# Patient Record
Sex: Male | Born: 1955 | Race: White | Hispanic: No | Marital: Married | Smoking: Current every day smoker
Health system: Southern US, Community
[De-identification: ages and names within clinical notes are randomized; demographics above are authoritative.]

## PROBLEM LIST (undated history)

## (undated) DIAGNOSIS — Z9889 Other specified postprocedural states: Secondary | ICD-10-CM

## (undated) DIAGNOSIS — I10 Essential (primary) hypertension: Secondary | ICD-10-CM

## (undated) DIAGNOSIS — Z87442 Personal history of urinary calculi: Secondary | ICD-10-CM

## (undated) DIAGNOSIS — C719 Malignant neoplasm of brain, unspecified: Secondary | ICD-10-CM

## (undated) DIAGNOSIS — Z8669 Personal history of other diseases of the nervous system and sense organs: Secondary | ICD-10-CM

## (undated) DIAGNOSIS — Z803 Family history of malignant neoplasm of breast: Secondary | ICD-10-CM

## (undated) DIAGNOSIS — M4302 Spondylolysis, cervical region: Secondary | ICD-10-CM

## (undated) DIAGNOSIS — C3490 Malignant neoplasm of unspecified part of unspecified bronchus or lung: Secondary | ICD-10-CM

## (undated) DIAGNOSIS — E119 Type 2 diabetes mellitus without complications: Secondary | ICD-10-CM

## (undated) DIAGNOSIS — K219 Gastro-esophageal reflux disease without esophagitis: Secondary | ICD-10-CM

## (undated) DIAGNOSIS — F32A Depression, unspecified: Secondary | ICD-10-CM

## (undated) DIAGNOSIS — C801 Malignant (primary) neoplasm, unspecified: Secondary | ICD-10-CM

## (undated) DIAGNOSIS — R112 Nausea with vomiting, unspecified: Secondary | ICD-10-CM

## (undated) DIAGNOSIS — E785 Hyperlipidemia, unspecified: Secondary | ICD-10-CM

## (undated) HISTORY — DX: Type 2 diabetes mellitus without complications: E11.9

## (undated) HISTORY — DX: Depression, unspecified: F32.A

## (undated) HISTORY — DX: Malignant neoplasm of unspecified part of unspecified bronchus or lung: C34.90

## (undated) HISTORY — DX: Personal history of other diseases of the nervous system and sense organs: Z86.69

## (undated) HISTORY — DX: Essential (primary) hypertension: I10

## (undated) HISTORY — PX: OTHER SURGICAL HISTORY: SHX169

## (undated) HISTORY — PX: HERNIA REPAIR: SHX51

## (undated) HISTORY — DX: Malignant neoplasm of brain, unspecified: C71.9

## (undated) HISTORY — DX: Hyperlipidemia, unspecified: E78.5

## (undated) HISTORY — DX: Spondylolysis, cervical region: M43.02

## (undated) HISTORY — DX: Family history of malignant neoplasm of breast: Z80.3

---

## 2011-11-01 ENCOUNTER — Telehealth: Payer: Self-pay

## 2011-11-01 NOTE — Telephone Encounter (Signed)
Called pt, he is shopping and will call back later.

## 2011-11-01 NOTE — Telephone Encounter (Signed)
Pt's wife called and said that she will call in a few weeks to schedule for her husband. She has just had a total knee replacement and cannot drive for awhile. She also said he has hemorroids and I told her he will need an OV also. She said she goes back to the doctor on 11/29/2011 and she will call sometime after that . I will also have him on my reminder list.

## 2011-11-02 ENCOUNTER — Encounter (INDEPENDENT_AMBULATORY_CARE_PROVIDER_SITE_OTHER): Payer: Self-pay | Admitting: *Deleted

## 2011-12-07 ENCOUNTER — Telehealth: Payer: Self-pay

## 2011-12-10 ENCOUNTER — Other Ambulatory Visit: Payer: Self-pay

## 2011-12-10 DIAGNOSIS — Z139 Encounter for screening, unspecified: Secondary | ICD-10-CM

## 2011-12-10 NOTE — Telephone Encounter (Addendum)
Gastroenterology Pre-Procedure Form   Pt's hemorrhoids not bothering him now. Does not want OV at this time.  Infor from pt's wife ( she was asking him questions as we talked)  Request Date: 12/07/2011     Requesting Physician: Dr. Rudi Heap     PATIENT INFORMATION:  Bradley Goodman is a 56 y.o., male (DOB=22-Nov-1955).  PROCEDURE: Procedure(s) requested: colonoscopy Procedure Reason: screening for colon cancer  PATIENT REVIEW QUESTIONS: The patient reports the following:   1. Diabetes Melitis: no 2. Joint replacements in the past 12 months: no 3. Major health problems in the past 3 months: no 4. Has an artificial valve or MVP:no 5. Has been advised in past to take antibiotics in advance of a procedure like teeth cleaning: no}    MEDICATIONS & ALLERGIES:    Patient reports the following regarding taking any blood thinners:   Plavix? no Aspirin?yes  Coumadin?  no  Patient confirms/reports the following medications:  Current Outpatient Prescriptions  Medication Sig Dispense Refill  . aspirin 81 MG tablet Take 81 mg by mouth daily.      . cholecalciferol (VITAMIN D) 1000 UNITS tablet Take 1,000 Units by mouth daily. Pt takes two tablets daily      . esomeprazole (NEXIUM) 40 MG capsule Take 40 mg by mouth daily before breakfast.      . Multiple Vitamin (MULTIVITAMIN) tablet Take 1 tablet by mouth daily.      . NON FORMULARY Mega Red Krill Oil   300 mg qd      . NON FORMULARY Vitamin B12   2500 mcg qd      . rosuvastatin (CRESTOR) 20 MG tablet Take 20 mg by mouth daily.        Patient confirms/reports the following allergies:  Allergies no known allergies  Patient is appropriate to schedule for requested procedure(s): yes  AUTHORIZATION INFORMATION Primary Insurance:   ID #:   Group #:  Pre-Cert / Auth required:  Pre-Cert / Auth #:   Secondary Insurance:   ID #:  Group #:  Pre-Cert / Auth required:  Pre-Cert / Auth #:   No orders of the defined types were placed in this  encounter.    SCHEDULE INFORMATION: Procedure has been scheduled as follows:  Date: 01/08/2012        Time: 10:30 AM  Location: [facility]  This Gastroenterology Pre-Precedure Form is being routed to the following provider(s) for review: R. Roetta Sessions, MD

## 2011-12-10 NOTE — Telephone Encounter (Signed)
Rx and instructons mailed to pt.

## 2011-12-10 NOTE — Telephone Encounter (Signed)
OK to schedule

## 2011-12-26 ENCOUNTER — Telehealth: Payer: Self-pay

## 2011-12-26 NOTE — Telephone Encounter (Signed)
Pt was scheduled for his colonoscopy on 01/08/2012 ( prior to Dr. Luvenia Starch vacation schedule). I called pt and rescheduled to 01/23/2012 @ 9:30 AM. Called back and informed Kim.

## 2012-01-22 ENCOUNTER — Encounter (HOSPITAL_COMMUNITY): Payer: Self-pay | Admitting: Pharmacy Technician

## 2012-01-22 MED ORDER — SODIUM CHLORIDE 0.45 % IV SOLN
Freq: Once | INTRAVENOUS | Status: AC
  Administered 2012-01-23: 09:00:00 via INTRAVENOUS

## 2012-01-23 ENCOUNTER — Ambulatory Visit (HOSPITAL_COMMUNITY)
Admission: RE | Admit: 2012-01-23 | Discharge: 2012-01-23 | Disposition: A | Discharge status: Home Health | Payer: 59 | Source: Ambulatory Visit | Attending: Internal Medicine | Admitting: Internal Medicine

## 2012-01-23 ENCOUNTER — Encounter (HOSPITAL_COMMUNITY): Payer: Self-pay | Admitting: *Deleted

## 2012-01-23 ENCOUNTER — Encounter (HOSPITAL_COMMUNITY)
Admission: RE | Disposition: A | Discharge status: Home Health | Payer: Self-pay | Source: Ambulatory Visit | Attending: Internal Medicine

## 2012-01-23 DIAGNOSIS — Z1211 Encounter for screening for malignant neoplasm of colon: Secondary | ICD-10-CM

## 2012-01-23 DIAGNOSIS — D128 Benign neoplasm of rectum: Secondary | ICD-10-CM | POA: Insufficient documentation

## 2012-01-23 DIAGNOSIS — E7800 Pure hypercholesterolemia, unspecified: Secondary | ICD-10-CM | POA: Insufficient documentation

## 2012-01-23 DIAGNOSIS — Z139 Encounter for screening, unspecified: Secondary | ICD-10-CM

## 2012-01-23 DIAGNOSIS — K621 Rectal polyp: Secondary | ICD-10-CM

## 2012-01-23 DIAGNOSIS — K620 Anal polyp: Secondary | ICD-10-CM

## 2012-01-23 DIAGNOSIS — D126 Benign neoplasm of colon, unspecified: Secondary | ICD-10-CM

## 2012-01-23 HISTORY — DX: Gastro-esophageal reflux disease without esophagitis: K21.9

## 2012-01-23 HISTORY — PX: COLONOSCOPY: SHX5424

## 2012-01-23 SURGERY — COLONOSCOPY
Anesthesia: Moderate Sedation

## 2012-01-23 MED ORDER — MIDAZOLAM HCL 5 MG/5ML IJ SOLN
INTRAMUSCULAR | Status: AC
  Filled 2012-01-23: qty 10

## 2012-01-23 MED ORDER — STERILE WATER FOR IRRIGATION IR SOLN
Status: DC | PRN
  Administered 2012-01-23: 10:00:00

## 2012-01-23 MED ORDER — MEPERIDINE HCL 100 MG/ML IJ SOLN
INTRAMUSCULAR | Status: AC
  Filled 2012-01-23: qty 2

## 2012-01-23 MED ORDER — MEPERIDINE HCL 100 MG/ML IJ SOLN
INTRAMUSCULAR | Status: DC | PRN
Start: 2012-01-23 — End: 2012-01-23
  Administered 2012-01-23: 25 mg via INTRAVENOUS
  Administered 2012-01-23: 50 mg via INTRAVENOUS
  Administered 2012-01-23: 25 mg via INTRAVENOUS

## 2012-01-23 MED ORDER — MIDAZOLAM HCL 5 MG/5ML IJ SOLN
INTRAMUSCULAR | Status: DC | PRN
  Administered 2012-01-23 (×2): 1 mg via INTRAVENOUS
  Administered 2012-01-23: 2 mg via INTRAVENOUS

## 2012-01-23 NOTE — Discharge Instructions (Addendum)
Colonoscopy Discharge Instructions  Read the instructions outlined below and refer to this sheet in the next few weeks. These discharge instructions provide you with general information on caring for yourself after you leave the hospital. Your doctor may also give you specific instructions. While your treatment has been planned according to the most current medical practices available, unavoidable complications occasionally occur. If you have any problems or questions after discharge, call Dr. Jena Gauss at [phone]. ACTIVITY  You may resume your regular activity, but move at a slower pace for the next 24 hours.   Take frequent rest periods for the next 24 hours.   Walking will help get rid of the air and reduce the bloated feeling in your belly (abdomen).   No driving for 24 hours (because of the medicine (anesthesia) used during the test).    Do not sign any important legal documents or operate any machinery for 24 hours (because of the anesthesia used during the test).  NUTRITION  Drink plenty of fluids.   You may resume your normal diet as instructed by your doctor.   Begin with a light meal and progress to your normal diet. Heavy or fried foods are harder to digest and may make you feel sick to your stomach (nauseated).   Avoid alcoholic beverages for 24 hours or as instructed.  MEDICATIONS  You may resume your normal medications unless your doctor tells you otherwise.  WHAT YOU CAN EXPECT TODAY  Some feelings of bloating in the abdomen.   Passage of more gas than usual.   Spotting of blood in your stool or on the toilet paper.  IF YOU HAD POLYPS REMOVED DURING THE COLONOSCOPY:  No aspirin products for 7 days or as instructed.   No alcohol for 7 days or as instructed.   Eat a soft diet for the next 24 hours.  FINDING OUT THE RESULTS OF YOUR TEST Not all test results are available during your visit. If your test results are not back during the visit, make an appointment  with your caregiver to find out the results. Do not assume everything is normal if you have not heard from your caregiver or the medical facility. It is important for you to follow up on all of your test results.  SEEK IMMEDIATE MEDICAL ATTENTION IF:  You have more than a spotting of blood in your stool.   Your belly is swollen (abdominal distention).   You are nauseated or vomiting.   You have a temperature over 101.   You have abdominal pain or discomfort that is severe or gets worse throughout the day.    Polyp Information provided to the patient.  Note MRI in the future until clip known to have passed.  Colon Polyps A polyp is extra tissue that grows inside your body. Colon polyps grow in the large intestine. The large intestine, also called the colon, is part of your digestive system. It is a long, hollow tube at the end of your digestive tract where your body makes and stores stool. Most polyps are not dangerous. They are benign. This means they are not cancerous. But over time, some types of polyps can turn into cancer. Polyps that are smaller than a pea are usually not harmful. But larger polyps could someday become or may already be cancerous. To be safe, doctors remove all polyps and test them.  WHO GETS POLYPS? Anyone can get polyps, but certain people are more likely than others. You may have a greater chance  of getting polyps if:  You are over 50.   You have had polyps before.   Someone in your family has had polyps.   Someone in your family has had cancer of the large intestine.   Find out if someone in your family has had polyps. You may also be more likely to get polyps if you:   Eat a lot of fatty foods.   Smoke.   Drink alcohol.   Do not exercise.   Eat too much.  SYMPTOMS  Most small polyps do not cause symptoms. People often do not know they have one until their caregiver finds it during a regular checkup or while testing them for something else. Some  people do have symptoms like these:  Bleeding from the anus. You might notice blood on your underwear or on toilet paper after you have had a bowel movement.   Constipation or diarrhea that lasts more than a week.   Blood in the stool. Blood can make stool look black or it can show up as red streaks in the stool.  If you have any of these symptoms, see your caregiver. HOW DOES THE DOCTOR TEST FOR POLYPS? The doctor can use four tests to check for polyps:  Digital rectal exam. The caregiver wears gloves and checks your rectum (the last part of the large intestine) to see if it feels normal. This test would find polyps only in the rectum. Your caregiver may need to do one of the other tests listed below to find polyps higher up in the intestine.   Barium enema. The caregiver puts a liquid called barium into your rectum before taking x-rays of your large intestine. Barium makes your intestine look white in the pictures. Polyps are dark, so they are easy to see.   Sigmoidoscopy. With this test, the caregiver can see inside your large intestine. A thin flexible tube is placed into your rectum. The device is called a sigmoidoscope, which has a light and a tiny video camera in it. The caregiver uses the sigmoidoscope to look at the last third of your large intestine.   Colonoscopy. This test is like sigmoidoscopy, but the caregiver looks at all of the large intestine. It usually requires sedation. This is the most common method for finding and removing polyps.  TREATMENT   The caregiver will remove the polyp during sigmoidoscopy or colonoscopy. The polyp is then tested for cancer.   If you have had polyps, your caregiver may want you to get tested regularly in the future.  PREVENTION  There is not one sure way to prevent polyps. You might be able to lower your risk of getting them if you:  Eat more fruits and vegetables and less fatty food.   Do not smoke.   Avoid alcohol.   Exercise every  day.   Lose weight if you are overweight.   Eating more calcium and folate can also lower your risk of getting polyps. Some foods that are rich in calcium are milk, cheese, and broccoli. Some foods that are rich in folate are chickpeas, kidney beans, and spinach.   Aspirin might help prevent polyps. Studies are under way.  Document Released: 06/20/2004 Document Revised: 09/13/2011 Document Reviewed: 11/26/2007 [facility] Patient Information 2012 [place], [place].

## 2012-01-23 NOTE — Op Note (Signed)
[facility] [address] [place], [place]  [address]  COLONOSCOPY PROCEDURE REPORT  PATIENT:  Bradley Goodman, Bradley Goodman  MR#:  [id number] BIRTHDATE:  [birth year], 55 yrs. old  GENDER:  male ENDOSCOPIST:  R. Roetta Sessions, MD FACP [facility] REF. BY:           Rudi Heap, M.D. PROCEDURE DATE:  01/23/2012 PROCEDURE:  Colonoscopy with multiple snare polypectomies colon tattooing and hemostasis clipping  INDICATIONS:  First ever average risk screening colonoscopy  INFORMED CONSENT:  The risks, benefits, alternatives and imponderables including but not limited to bleeding, perforation as well as the possibility of a missed lesion have been reviewed. The potential for biopsy, lesion removal, etc. have also been discussed.  Questions have been answered.  All parties agreeable. Please see the history and physical in the medical record for more information.  MEDICATIONS:  Versed 4 mg IV and Demerol 100 mg IV in divided this  DESCRIPTION OF PROCEDURE:  After a digital rectal exam was performed, the EC-[id number] ([id number]) colonoscope was advanced from the anus through the rectum and colon to the area of the cecum, ileocecal valve and appendiceal orifice.  The cecum was deeply intubated.  These structures were well-seen and photographed for the record.  From the level of the cecum and ileocecal valve, the scope was slowly and cautiously withdrawn.  The mucosal surfaces were carefully surveyed utilizing scope tip deflection to facilitate fold flattening as needed.  The scope was pulled down into the rectum where a thorough examination including retroflexion was performed. <<PROCEDUREIMAGES>>  FINDINGS:  Adequate preparation. 5 mm polyp in 5 cm from the anal verge. Minimal internal hemorrhoids and anal papilla. Patient had multiple left-sided colon polyps (The patient had an 8 mm and 6 mm pedunculated polyps in the descending segment. The patient had a large - nearly 2 cm polyp in the sigmoid at  35 cm on a long stalk; the remainder of the colonic mucosa appeared normal. The terminal ileal mucosa - distal 5 cm appeared normal.  THERAPEUTIC / DIAGNOSTIC MANEUVERS PERFORMED: The colonic and rectal polyps removed with hot snare cautery. The polypectomy site where the largest polyp was removed wanted to ooze. This was treated with resolution clipping x1. The stalk was also tattooed.  COMPLICATIONS:  None  CECAL WITHDRAWAL TIME: 37 minutes  IMPRESSION: Multiple rectal and colonic polyps-removed as described above  RECOMMENDATIONS: Followup on pathology. Note MRI in the future until the clip is known to have passed.  ______________________________ R. Roetta Sessions, MD Caleen Essex  CC:  Rudi Heap, M.D.  n. eSIGNED:   R. Roetta Sessions at 01/23/2012 10:34 AM  Laurette Schimke, [phone]

## 2012-01-23 NOTE — Progress Notes (Signed)
1110 Spoke to Dr. Jena Gauss about blood in toilet after Tim voided. Instructed for patient to wait 30 minutes and void again. Monitor outcome. Possible flex sigmoid to follow.

## 2012-01-23 NOTE — H&P (Signed)
  Primary Care Physician:  Rudi Heap, MD, MD Primary Gastroenterologist:  Dr. Jena Gauss  Pre-Procedure History & Physical: HPI:  Bradley Goodman is a 56 y.o. male here for here for first ever screening colonoscopy. The bowel symptoms. No family history of colon cancer or colon polyps.  Past Medical History  Diagnosis Date  . Hypercholesteremia   . Headache     history of migraines  . GERD (gastroesophageal reflux disease)   . Kidney stones     Past Surgical History  Procedure Date  . Bilateral inguinal hernia repair     Prior to Admission medications   Medication Sig Start Date End Date Taking? Authorizing Provider  esomeprazole (NEXIUM) 40 MG capsule Take 40 mg by mouth every evening.    Yes Historical Provider, MD  loratadine (CLARITIN) 10 MG tablet Take 10 mg by mouth every morning.   Yes Historical Provider, MD  aspirin EC 81 MG tablet Take 81 mg by mouth every evening.    Historical Provider, MD  cholecalciferol (VITAMIN D) 1000 UNITS tablet Take 2,000 Units by mouth every evening. Pt takes two tablets daily    Historical Provider, MD  Cyanocobalamin (B-12) 5000 MCG SUBL Place 1 tablet under the tongue every evening.    Historical Provider, MD  Boris Lown Oil 300 MG CAPS Take 1 capsule by mouth every evening.    Historical Provider, MD  Multiple Vitamin (MULTIVITAMIN) tablet Take 1 tablet by mouth every evening.     Historical Provider, MD  rosuvastatin (CRESTOR) 20 MG tablet Take 20 mg by mouth every evening.     Historical Provider, MD    Allergies as of 12/10/2011  . (No Known Allergies)    Family History  Problem Relation Age of Onset  . Colon cancer Neg Hx     History   Social History  . Marital Status: Married    Spouse Name: N/A    Number of Children: N/A  . Years of Education: N/A   Occupational History  . Not on file.   Social History Main Topics  . Smoking status: Current Everyday Smoker -- 1.0 packs/day for 30 years    Types: Cigarettes  . Smokeless  tobacco: Not on file  . Alcohol Use: No  . Drug Use: No  . Sexually Active:    Other Topics Concern  . Not on file   Social History Narrative  . No narrative on file    Review of Systems: See HPI, otherwise negative ROS  Physical Exam: BP 129/95  Pulse 121  Temp(Src) 98.4 F (36.9 C) (Oral)  Resp 20  Ht 6' (1.829 m)  Wt 156 lb (70.761 kg)  BMI 21.16 kg/m2  SpO2 100% General:   Alert,  Well-developed, well-nourished, pleasant and cooperative in NAD Skin:  Intact without significant lesions or rashes. Eyes:  Sclera clear, no icterus.   Conjunctiva pink. Ears:  Normal auditory acuity. Nose:  No deformity, discharge,  or lesions. Mouth:  No deformity or lesions. Neck:  Supple; no masses or thyromegaly. No significant cervical adenopathy. Lungs:  Clear throughout to auscultation.   No wheezes, crackles, or rhonchi. No acute distress. Heart:  Regular rate and rhythm; no murmurs, clicks, rubs,  or gallops. Abdomen: Non-distended, normal bowel sounds.  Soft and nontender without appreciable mass or hepatosplenomegaly.  Pulses:  Normal pulses noted. Extremities:  Without clubbing or edema.

## 2012-01-23 NOTE — Progress Notes (Signed)
No blood passed at 1130 when Bradley Goodman went to the restroom. Few small clots noted when Bradley Goodman went to the restroom at 1150. No signs of active bleeding at this time. Discharge per Dr. Jena Gauss. Patient encouraged to come back to hospital if any active bleeding noted.

## 2012-01-23 NOTE — Progress Notes (Signed)
1058 Spoke to Dr. Jena Gauss about Tim taking Aspirin 81mg . May follow written instructions given-no aspirin for 7 days.

## 2012-01-25 ENCOUNTER — Encounter (HOSPITAL_COMMUNITY): Payer: Self-pay | Admitting: Internal Medicine

## 2012-01-25 ENCOUNTER — Encounter: Payer: Self-pay | Admitting: Internal Medicine

## 2012-01-28 ENCOUNTER — Encounter: Payer: Self-pay | Admitting: Internal Medicine

## 2013-01-28 ENCOUNTER — Other Ambulatory Visit: Payer: Self-pay | Admitting: Nurse Practitioner

## 2013-01-28 MED ORDER — OSELTAMIVIR PHOSPHATE 75 MG PO CAPS: 75.0000 mg | ORAL_CAPSULE | Freq: Every day | ORAL | Status: DC

## 2013-02-11 ENCOUNTER — Ambulatory Visit: Payer: Self-pay | Admitting: Family Medicine

## 2013-04-13 ENCOUNTER — Other Ambulatory Visit: Payer: Self-pay | Admitting: Family Medicine

## 2013-04-15 NOTE — Telephone Encounter (Signed)
LAST OV 1/14. EPIC SHOWS NEXIUM. PAPER CHART HAS CHANGED TO PROTONIX. PLEASE REVIEW.

## 2013-06-24 ENCOUNTER — Ambulatory Visit (INDEPENDENT_AMBULATORY_CARE_PROVIDER_SITE_OTHER): Payer: 59 | Admitting: Family Medicine

## 2013-06-24 ENCOUNTER — Encounter: Payer: Self-pay | Admitting: Family Medicine

## 2013-06-24 VITALS — BP 144/101 | HR 88 | Temp 97.5°F | Ht 72.0 in | Wt 156.0 lb

## 2013-06-24 DIAGNOSIS — I10 Essential (primary) hypertension: Secondary | ICD-10-CM | POA: Insufficient documentation

## 2013-06-24 DIAGNOSIS — Z125 Encounter for screening for malignant neoplasm of prostate: Secondary | ICD-10-CM

## 2013-06-24 DIAGNOSIS — E785 Hyperlipidemia, unspecified: Secondary | ICD-10-CM

## 2013-06-24 DIAGNOSIS — K219 Gastro-esophageal reflux disease without esophagitis: Secondary | ICD-10-CM | POA: Insufficient documentation

## 2013-06-24 DIAGNOSIS — R7303 Prediabetes: Secondary | ICD-10-CM | POA: Insufficient documentation

## 2013-06-24 DIAGNOSIS — I152 Hypertension secondary to endocrine disorders: Secondary | ICD-10-CM | POA: Insufficient documentation

## 2013-06-24 DIAGNOSIS — E1159 Type 2 diabetes mellitus with other circulatory complications: Secondary | ICD-10-CM | POA: Insufficient documentation

## 2013-06-24 DIAGNOSIS — N400 Enlarged prostate without lower urinary tract symptoms: Secondary | ICD-10-CM

## 2013-06-24 DIAGNOSIS — E119 Type 2 diabetes mellitus without complications: Secondary | ICD-10-CM

## 2013-06-24 LAB — POCT GLYCOSYLATED HEMOGLOBIN (HGB A1C): Hemoglobin A1C: 5.5

## 2013-06-24 LAB — POCT CBC
Hemoglobin: 15.3 g/dL (ref 14.1–18.1)
MPV: 9.2 fL (ref 0–99.8)
POC Granulocyte: 5.4 (ref 2–6.9)
POC LYMPH PERCENT: 26 %L (ref 10–50)
RBC: 5.2 M/uL (ref 4.69–6.13)

## 2013-06-24 NOTE — Progress Notes (Signed)
  Subjective:    Patient ID: Bradley Goodman, male    DOB: 1956-09-11, 57 y.o.   MRN: [id number]  HPI Patient returns to clinic today for followup and management of chronic medical problems. These include diabetes, GERD, hyperlipidemia, and hypertension. The patient works as a Engineer, civil (consulting) at [employer]. Patient says his home blood pressure readings run between 90 and 110 for the systolic over the 70s for the diastolic. He checks his blood sugars sporadically, but says when he checks them they are good.   Review of Systems  Constitutional: Negative.   HENT: Negative.   Eyes: Negative.   Respiratory: Negative.   Cardiovascular: Negative.   Gastrointestinal: Negative.   Endocrine: Negative.   Genitourinary: Negative.   Musculoskeletal: Negative.   Skin: Negative.   Allergic/Immunologic: Negative.   Neurological: Negative.   Hematological: Negative.   Psychiatric/Behavioral: Negative.        Objective:   Physical Exam BP 144/101  Pulse 88  Temp(Src) 97.5 F (36.4 C) (Oral)  Ht 6' (1.829 m)  Wt 156 lb (70.761 kg)  BMI 21.15 kg/m2 Repeat blood pressure right arm 146/104 The patient appeared well nourished and normally developed, alert and oriented to time and place. Speech, behavior and judgement appear normal. Vital signs as documented.  Head exam is unremarkable. No scleral icterus or pallor noted. Ears nose and throat were normal.  Neck is without jugular venous distension, thyromegally, or carotid bruits. Carotid upstrokes are brisk bilaterally. No cervical adenopathy. Lungs are clear anteriorly and posteriorly to auscultation. Normal respiratory effort. Cardiac exam reveals regular rate and rhythm at 72 per minute. First and second heart sounds normal.  No murmurs, rubs or gallops.  Abdominal exam reveals normal bowl sounds, no masses, no organomegaly and no aortic enlargement. No inguinal adenopathy. There is no abdominal tenderness Rectal exam revealed no masses.  Prostate was mildly enlarged but no lumps. Genitalia were normal without a hernia. Extremities are nonedematous and both femoral and pedal pulses are normal. Skin without pallor or jaundice.  Warm and dry, without rash. Neurologic exam reveals normal deep tendon reflexes and normal sensation. Diabetic foot exam was done.          Assessment & Plan:  1. GERD (gastroesophageal reflux disease) - POCT CBC - Hepatic function panel  2. Diabetes - POCT CBC - POCT glycosylated hemoglobin (Hb A1C) - BMP8+EGFR  3. Other and unspecified hyperlipidemia - BMP8+EGFR - Hepatic function panel - NMR, lipoprofile  4. Hypertension, whitecoat  5. Special screening for malignant neoplasm of prostate - PSA, total and free  6. BPH (benign prostatic hyperplasia)  Patient Instructions  Continue current medications. Continue good therapeutic lifestyle changes.  Fall precautions discussed with patient.  Schedule your flu vaccine the first of October.  Follow up as planned and earlier as needed.   Continued to work on smoking cessation Continue to monitor her blood pressures and blood sugars   Nyra Capes MD

## 2013-06-24 NOTE — Patient Instructions (Addendum)
Continue current medications. Continue good therapeutic lifestyle changes.  Fall precautions discussed with patient.  Schedule your flu vaccine the first of October.  Follow up as planned and earlier as needed.   Continued to work on smoking cessation Continue to monitor her blood pressures and blood sugars

## 2013-06-25 ENCOUNTER — Other Ambulatory Visit: Payer: Self-pay | Admitting: Family Medicine

## 2013-06-26 LAB — NMR, LIPOPROFILE
Cholesterol: 115 mg/dL (ref <200)
HDL Cholesterol by NMR: 37 mg/dL — ABNORMAL LOW (ref >=40)
HDL Particle Number: 29.7 umol/L — ABNORMAL LOW (ref >=30.5)
Small LDL Particle Number: 1163 nmol/L — ABNORMAL HIGH (ref <=527)
Triglycerides by NMR: 101 mg/dL (ref <150)

## 2013-06-26 LAB — BMP8+EGFR
BUN: 19 mg/dL (ref 6–24)
CO2: 25 mmol/L (ref 18–29)
Calcium: 9.7 mg/dL (ref 8.7–10.2)
Creatinine, Ser: 1.07 mg/dL (ref 0.76–1.27)

## 2013-06-26 LAB — HEPATIC FUNCTION PANEL
Albumin: 4.2 g/dL (ref 3.5–5.5)
Total Bilirubin: 0.3 mg/dL (ref 0.0–1.2)
Total Protein: 6.1 g/dL (ref 6.0–8.5)

## 2013-06-26 LAB — PSA, TOTAL AND FREE: PSA, Free: 0.33 ng/mL

## 2013-07-14 ENCOUNTER — Other Ambulatory Visit: Payer: Self-pay | Admitting: Nurse Practitioner

## 2013-07-15 ENCOUNTER — Other Ambulatory Visit: Payer: Self-pay | Admitting: *Deleted

## 2013-07-15 DIAGNOSIS — Z87898 Personal history of other specified conditions: Secondary | ICD-10-CM

## 2013-07-15 MED ORDER — SCOPOLAMINE 1 MG/3DAYS TD PT72: 1.0000 | MEDICATED_PATCH | TRANSDERMAL | Status: DC

## 2013-07-15 NOTE — Telephone Encounter (Signed)
Patient and his wife are going on a cruise next week and are requesting Transderm-scop patches.

## 2013-08-13 ENCOUNTER — Other Ambulatory Visit: Payer: Self-pay

## 2013-10-27 ENCOUNTER — Ambulatory Visit (INDEPENDENT_AMBULATORY_CARE_PROVIDER_SITE_OTHER): Payer: 59

## 2013-10-27 ENCOUNTER — Ambulatory Visit (INDEPENDENT_AMBULATORY_CARE_PROVIDER_SITE_OTHER): Payer: 59 | Admitting: Family Medicine

## 2013-10-27 ENCOUNTER — Encounter: Payer: Self-pay | Admitting: Family Medicine

## 2013-10-27 VITALS — BP 136/90 | HR 96 | Temp 97.7°F | Ht 72.0 in | Wt 160.0 lb

## 2013-10-27 DIAGNOSIS — K219 Gastro-esophageal reflux disease without esophagitis: Secondary | ICD-10-CM

## 2013-10-27 DIAGNOSIS — I10 Essential (primary) hypertension: Secondary | ICD-10-CM

## 2013-10-27 DIAGNOSIS — R911 Solitary pulmonary nodule: Secondary | ICD-10-CM

## 2013-10-27 DIAGNOSIS — E785 Hyperlipidemia, unspecified: Secondary | ICD-10-CM

## 2013-10-27 DIAGNOSIS — E119 Type 2 diabetes mellitus without complications: Secondary | ICD-10-CM

## 2013-10-27 DIAGNOSIS — E559 Vitamin D deficiency, unspecified: Secondary | ICD-10-CM

## 2013-10-27 LAB — POCT CBC
Granulocyte percent: 75.2 %G (ref 37–80)
HEMATOCRIT: 47 % (ref 43.5–53.7)
HEMOGLOBIN: 16 g/dL (ref 14.1–18.1)
Lymph, poc: 1 (ref 0.6–3.4)
MCH, POC: 30.2 pg (ref 27–31.2)
MCHC: 34.1 g/dL (ref 31.8–35.4)
MCV: 88.4 fL (ref 80–97)
MPV: 8.5 fL (ref 0–99.8)
POC GRANULOCYTE: 6.6 (ref 2–6.9)
POC LYMPH PERCENT: 21.6 %L (ref 10–50)
Platelet Count, POC: 205 10*3/uL (ref 142–424)
RBC: 5.3 M/uL (ref 4.69–6.13)
RDW, POC: 14.1 %
WBC: 8.8 10*3/uL (ref 4.6–10.2)

## 2013-10-27 LAB — POCT GLYCOSYLATED HEMOGLOBIN (HGB A1C)

## 2013-10-27 IMAGING — CR DG CHEST 2V
3 series · 3 of 3 positions shown · non-contrast
Comparison: None.

CLINICAL DATA: Hypertension.

EXAM:
CHEST  2 VIEW

[view not recorded (1 of 3)]
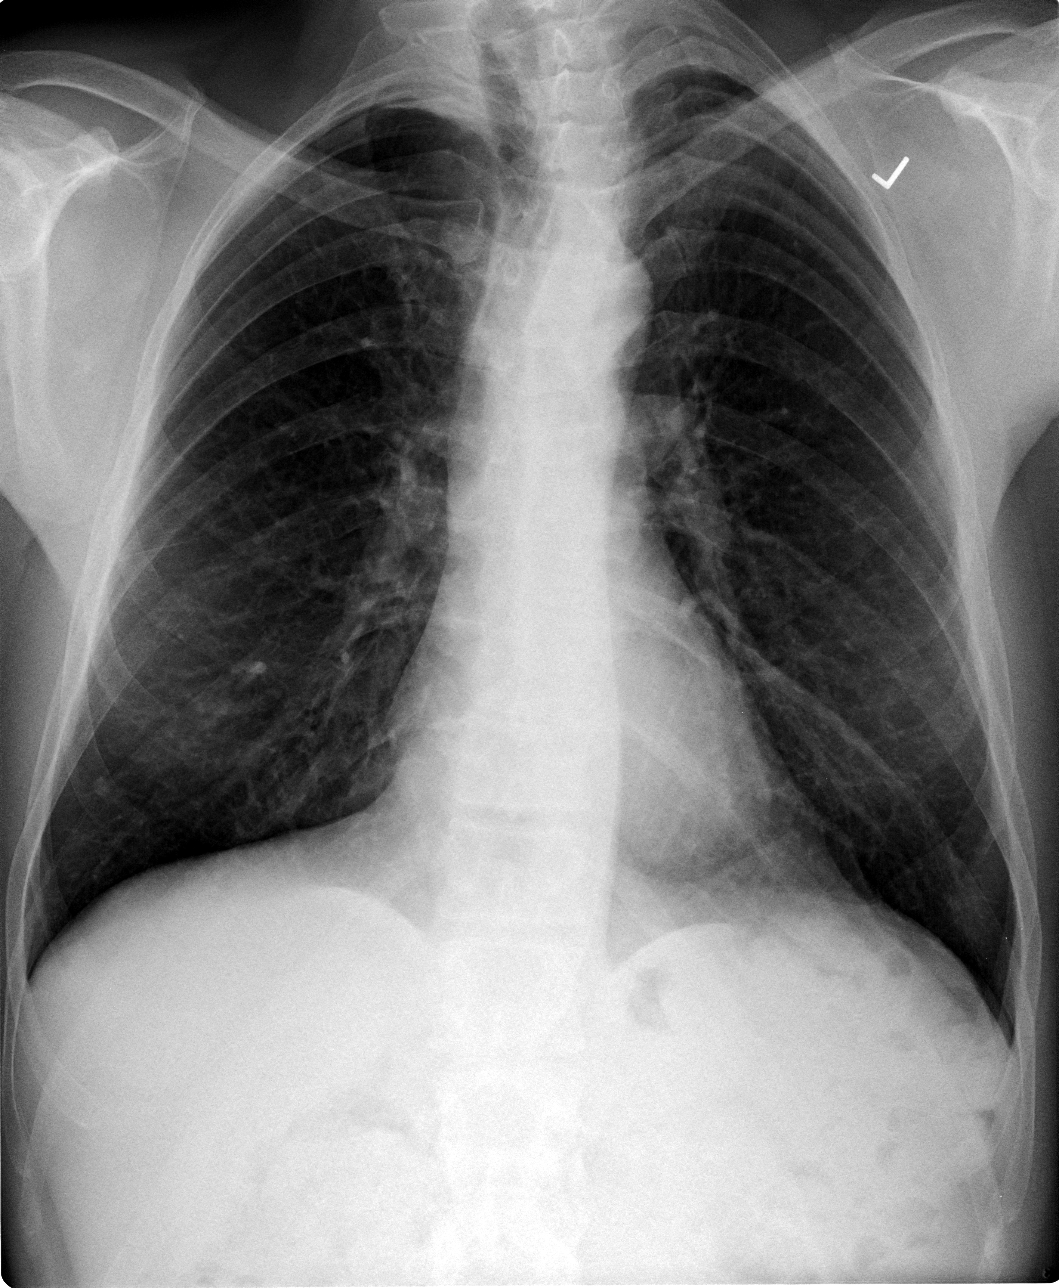

[view not recorded (2 of 3)]
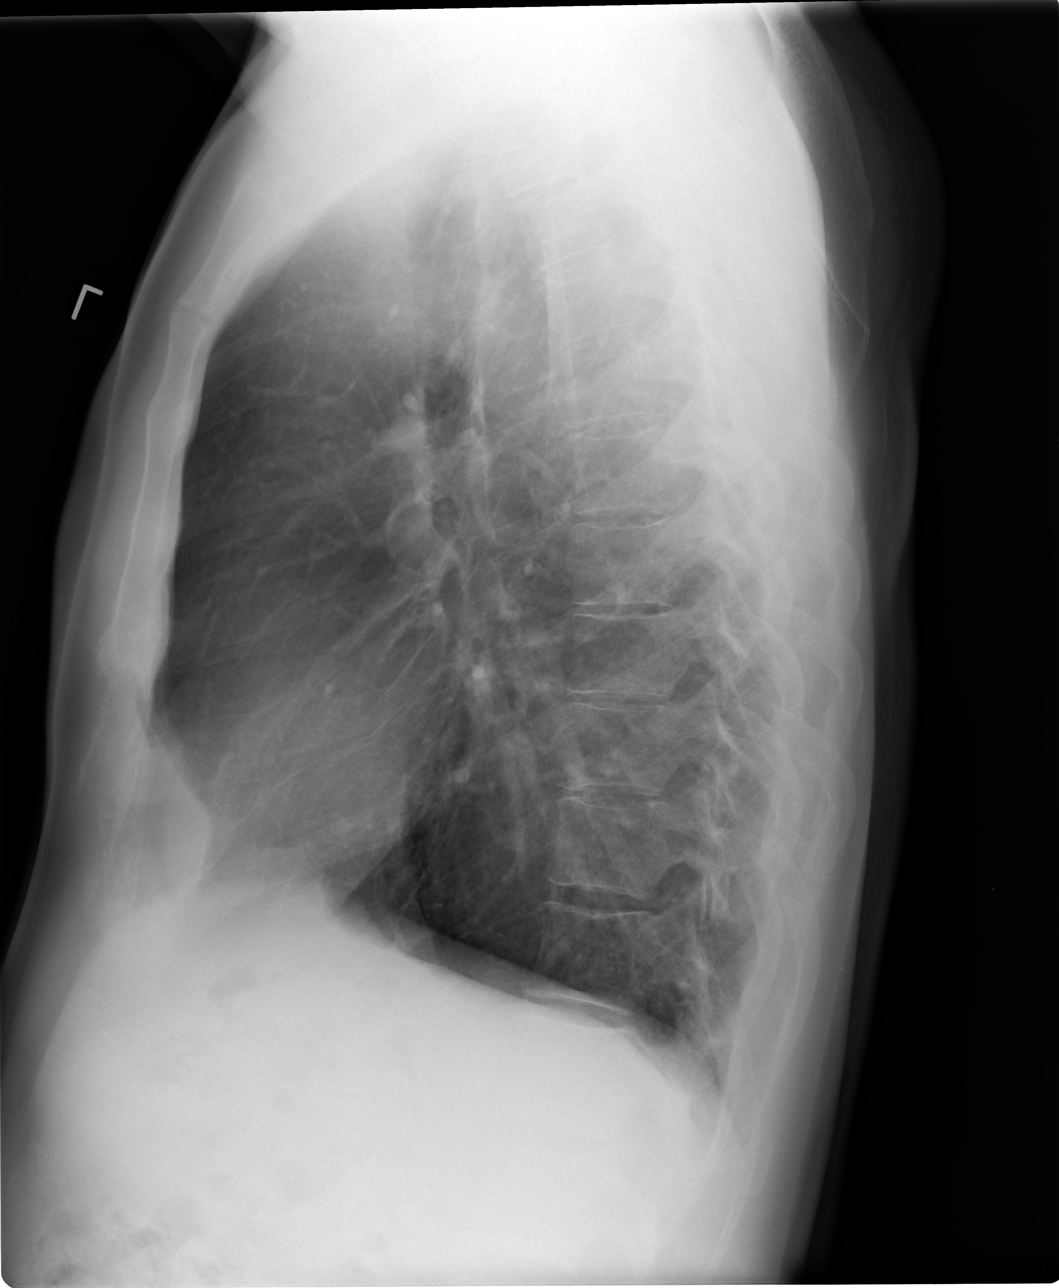

[view not recorded (3 of 3)]
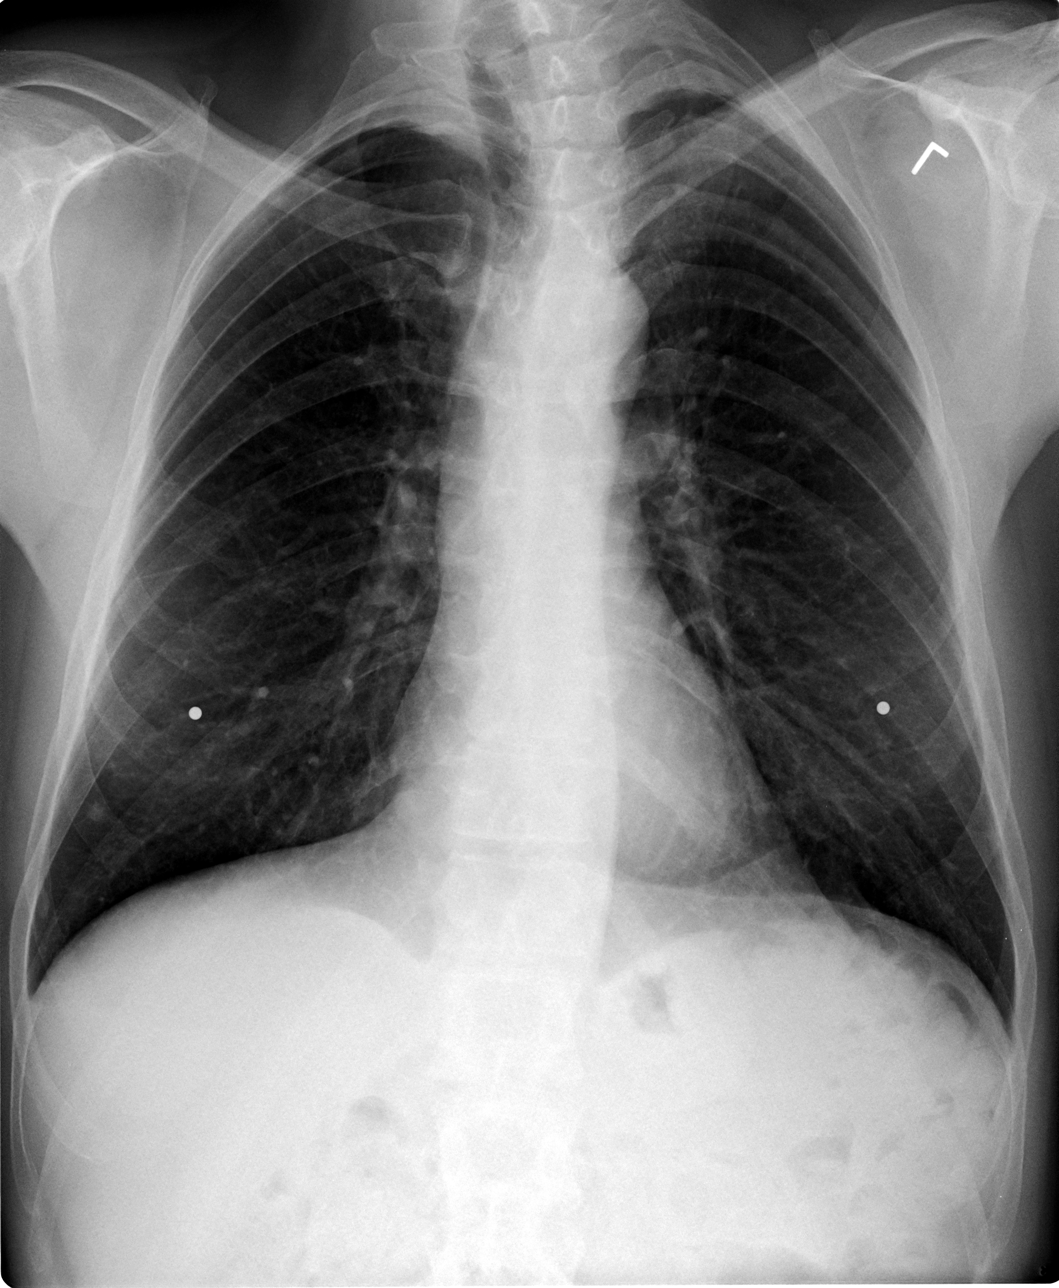

[3 of 3 positions shown; findings below may reference images not displayed]

FINDINGS: PA views with and without nipple markers are provided. There is a
0.6 cm nodular opacity in the right lower lobe which is not the
patient's nipple. The lungs are otherwise clear. Heart size is
normal. No pneumothorax or pleural effusion. Convex left scoliosis
noted.
IMPRESSION: 0.6 cm right lower lobe nodular opacity. Chest CT with contrast is
recommended for further evaluation.

These results will be called to the ordering clinician or
representative by the Radiologist Assistant, and communication
documented in the PACS Dashboard.

## 2013-10-27 NOTE — Patient Instructions (Addendum)
Continue current medications. Continue good therapeutic lifestyle changes which include good diet and exercise. Fall precautions discussed with patient. Schedule your flu vaccine if you haven't had it yet If you are over 57 years old - you may need Prevnar 43 or the adult Pneumonia vaccine. Please return the FOBT Please try to reduce your smoking completely Do not forget to schedule your eye exam We will call you with the lab results once those results are available Continue to watch her sodium intake Check blood sugars and blood pressures periodically at home and bring those readings with you when you return to the clinic in the future

## 2013-10-27 NOTE — Progress Notes (Signed)
Subjective:    Patient ID: Bradley Goodman, male    DOB: [birth year], 58 y.o.   MRN: [id number]  HPI Pt here for follow up and management of chronic medical problems. Patient indicates his home blood pressures are usually in the 130s over the 80s. His home blood sugars run 90-120 range. He will be getting lab work today. He will check with his insurance regarding the Prevnar vaccine. He will return the FOBT that he has at home.        Patient Active Problem List   Diagnosis Date Noted  . GERD (gastroesophageal reflux disease) 06/24/2013  . Diabetes mellitus type 2 controlled 06/24/2013  . Hyperlipidemia 06/24/2013  . Hypertension 06/24/2013   Outpatient Encounter Prescriptions as of 10/27/2013  Medication Sig  . aspirin EC 81 MG tablet Take 81 mg by mouth every evening.  . cholecalciferol (VITAMIN D) 1000 UNITS tablet Take 2,000 Units by mouth every evening. Pt takes two tablets daily  . CRESTOR 20 MG tablet TAKE 1 TABLET BY MOUTH DAILY  . Cyanocobalamin (B-12) 5000 MCG SUBL Place 1 tablet under the tongue every evening.  Javier Docker Oil 300 MG CAPS Take 1 capsule by mouth every evening.  . loratadine (CLARITIN) 10 MG tablet Take 10 mg by mouth every morning.  . Multiple Vitamin (MULTIVITAMIN) tablet Take 1 tablet by mouth every evening.   . pantoprazole (PROTONIX) 40 MG tablet TAKE 1 TABLET BY MOUTH DAILY  . [DISCONTINUED] scopolamine (TRANSDERM-SCOP) 1.5 MG Place 1 patch (1.5 mg total) onto the skin every 3 (three) days.    Review of Systems  Constitutional: Negative.   HENT: Negative.   Eyes: Negative.   Respiratory: Negative.   Cardiovascular: Negative.   Gastrointestinal: Negative.   Endocrine: Negative.   Genitourinary: Negative.   Musculoskeletal: Positive for arthralgias (right hip flare- up).  Skin: Negative.   Allergic/Immunologic: Negative.   Neurological: Negative.   Hematological: Negative.   Psychiatric/Behavioral: Negative.        Objective:   Physical  Exam  Nursing note and vitals reviewed. Constitutional: He is oriented to person, place, and time. He appears well-developed and well-nourished. No distress.  HENT:  Head: Normocephalic and atraumatic.  Right Ear: External ear normal.  Left Ear: External ear normal.  Nose: Nose normal.  Mouth/Throat: Oropharynx is clear and moist. No oropharyngeal exudate.  Eyes: Conjunctivae and EOM are normal. Pupils are equal, round, and reactive to light. Right eye exhibits no discharge. Left eye exhibits no discharge. No scleral icterus.  Neck: Normal range of motion. Neck supple. No tracheal deviation present. No thyromegaly present.  Cardiovascular: Normal rate, regular rhythm, normal heart sounds and intact distal pulses.  Exam reveals no gallop and no friction rub.   No murmur heard. At 96 per minute  Pulmonary/Chest: Effort normal and breath sounds normal. No respiratory distress. He has no wheezes. He has no rales. He exhibits no tenderness.  Abdominal: Soft. Bowel sounds are normal. He exhibits no mass. There is no tenderness. There is no rebound and no guarding.  Musculoskeletal: Normal range of motion. He exhibits no edema and no tenderness.  Lymphadenopathy:    He has no cervical adenopathy.  Neurological: He is alert and oriented to person, place, and time. He has normal reflexes. No cranial nerve deficit.  Skin: Skin is warm and dry. No rash noted. No erythema. No pallor.  Psychiatric: He has a normal mood and affect. His behavior is normal. Judgment and thought content normal.   BP  136/90  Pulse 96  Temp(Src) 97.7 F (36.5 C) (Oral)  Ht 6' (1.829 m)  Wt 160 lb (72.576 kg)  BMI 21.70 kg/m2  [facility] reading (PRIMARY) by  Dr. Laurance Flatten-  no active disease                                    Assessment & Plan:  1. Diabetes mellitus type 2 controlled - POCT CBC - POCT glycosylated hemoglobin (Hb A1C)  2. GERD (gastroesophageal reflux disease)- POCT CBC  3. Hypertension - POCT CBC -  BMP8+EGFR - Hepatic function panel - DG Chest 2 View; Future  4. Hyperlipidemia - POCT CBC - NMR, lipoprofile  5. Vitamin D deficiency - Vit D  25 hydroxy (rtn osteoporosis monitoring)   Patient Instructions  Continue current medications. Continue good therapeutic lifestyle changes which include good diet and exercise. Fall precautions discussed with patient. Schedule your flu vaccine if you haven't had it yet If you are over 38 years old - you may need Prevnar 29 or the adult Pneumonia vaccine. Please return the FOBT Please try to reduce your smoking completely Do not forget to schedule your eye exam We will call you with the lab results once those results are available Continue to watch her sodium intake Check blood sugars and blood pressures periodically at home and bring those readings with you when you return to the clinic in the future   Arrie Senate MD

## 2013-10-27 NOTE — Addendum Note (Signed)
Addended by: Ilean China on: 10/27/2013 10:33 AM   Modules accepted: Orders

## 2013-10-29 LAB — BMP8+EGFR
BUN/Creatinine Ratio: 11 (ref 9–20)
BUN: 14 mg/dL (ref 6–24)
CALCIUM: 9.7 mg/dL (ref 8.7–10.2)
CO2: 25 mmol/L (ref 18–29)
CREATININE: 1.22 mg/dL (ref 0.76–1.27)
Chloride: 100 mmol/L (ref 97–108)
GFR, EST AFRICAN AMERICAN: 76 mL/min/{1.73_m2} (ref >59)
GFR, EST NON AFRICAN AMERICAN: 66 mL/min/{1.73_m2} (ref >59)
GLUCOSE: 118 mg/dL — AB (ref 65–99)
POTASSIUM: 5 mmol/L (ref 3.5–5.2)
SODIUM: 142 mmol/L (ref 134–144)

## 2013-10-29 LAB — NMR, LIPOPROFILE
CHOLESTEROL: 108 mg/dL (ref <200)
HDL Cholesterol by NMR: 36 mg/dL — ABNORMAL LOW (ref >=40)
HDL PARTICLE NUMBER: 30 umol/L — AB (ref >=30.5)
LDL Particle Number: 767 nmol/L (ref <1000)
LDL SIZE: 20.3 nm — AB (ref >20.5)
LDLC SERPL CALC-MCNC: 50 mg/dL (ref <100)
LP-IR SCORE: 71 — AB (ref <=45)
SMALL LDL PARTICLE NUMBER: 400 nmol/L (ref <=527)
TRIGLYCERIDES BY NMR: 110 mg/dL (ref <150)

## 2013-10-29 LAB — HEPATIC FUNCTION PANEL
ALK PHOS: 75 IU/L (ref 39–117)
ALT: 22 IU/L (ref 0–44)
AST: 16 IU/L (ref 0–40)
Albumin: 4.4 g/dL (ref 3.5–5.5)
BILIRUBIN DIRECT: 0.11 mg/dL (ref 0.00–0.40)
BILIRUBIN TOTAL: 0.3 mg/dL (ref 0.0–1.2)
TOTAL PROTEIN: 6.4 g/dL (ref 6.0–8.5)

## 2013-10-29 LAB — VITAMIN D 25 HYDROXY (VIT D DEFICIENCY, FRACTURES): Vit D, 25-Hydroxy: 62.2 ng/mL (ref 30.0–100.0)

## 2013-10-30 ENCOUNTER — Telehealth: Payer: Self-pay

## 2013-10-30 ENCOUNTER — Ambulatory Visit (HOSPITAL_COMMUNITY)
Admission: RE | Admit: 2013-10-30 | Discharge: 2013-10-30 | Disposition: A | Discharge status: Home / Self Care | Payer: 59 | Source: Ambulatory Visit | Attending: Family Medicine | Admitting: Family Medicine

## 2013-10-30 DIAGNOSIS — R911 Solitary pulmonary nodule: Secondary | ICD-10-CM | POA: Insufficient documentation

## 2013-10-30 DIAGNOSIS — R918 Other nonspecific abnormal finding of lung field: Secondary | ICD-10-CM | POA: Insufficient documentation

## 2013-10-30 IMAGING — CT CT CHEST W/ CM
2 of 3 series · 15 of 36 positions shown, 18 images · IV contrast (Omnipaque 300)
Comparison: Chest radiographs [DATE].

CLINICAL DATA: Pulmonary nodule.

EXAM:
CT CHEST WITH CONTRAST
TECHNIQUE: Multidetector CT imaging of the chest was performed during
intravenous contrast administration.
CONTRAST:  80mL OMNIPAQUE IOHEXOL 300 MG/ML  SOLN

[Series 2: chestroutine 5.0 b40f · axial · 0.72mm/px · z∈[-384,-99]mm · 12 of 69 slices shown, 15 images]
[im 6/69  mediastinal]
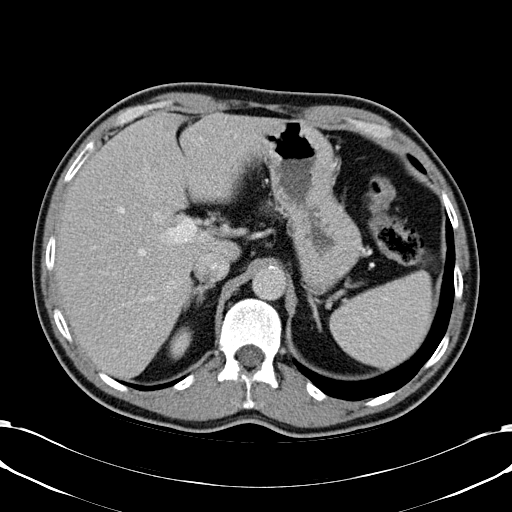
[im 6/69  lung]
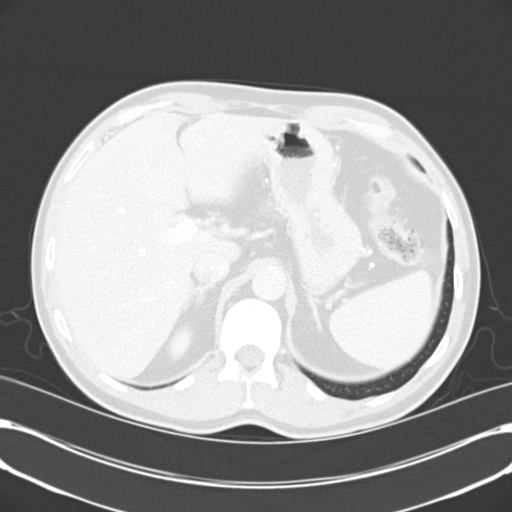
[im 11/69  lung]
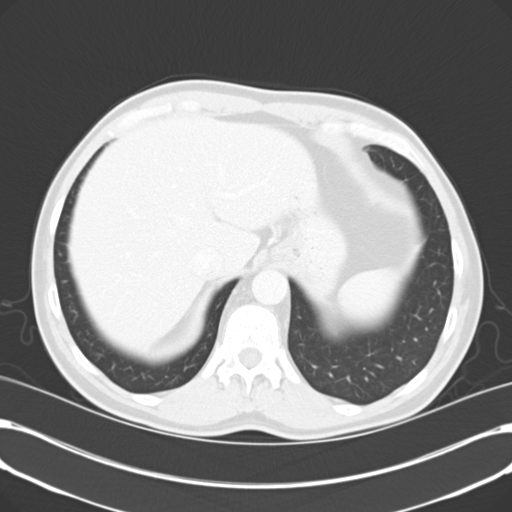
[im 16/69  lung]
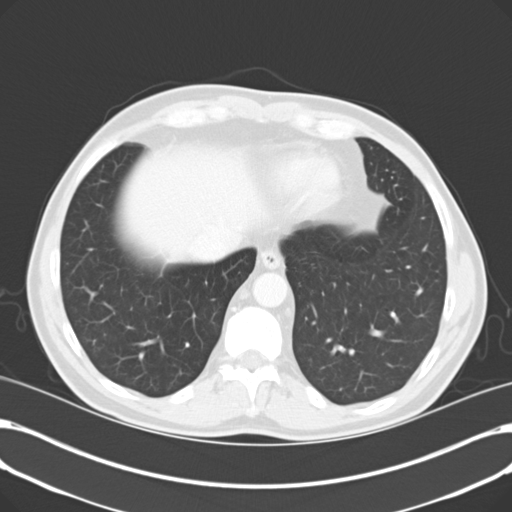
[im 21/69  lung]
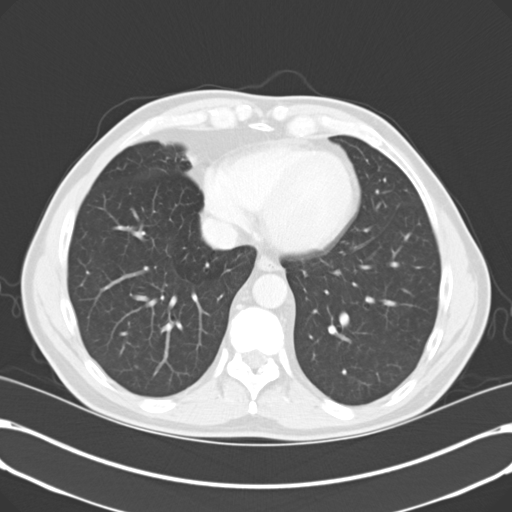
[im 26/69  mediastinal]
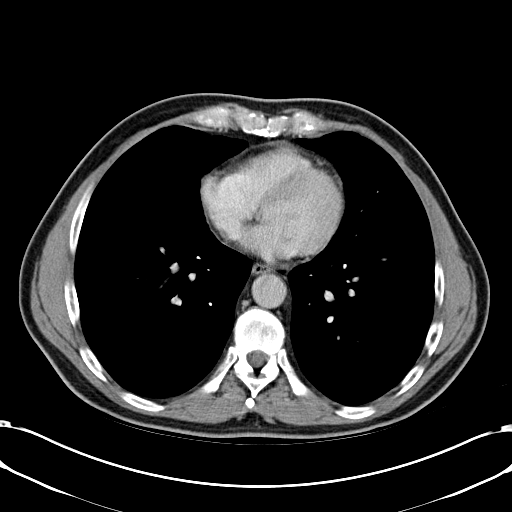
[im 26/69  lung]
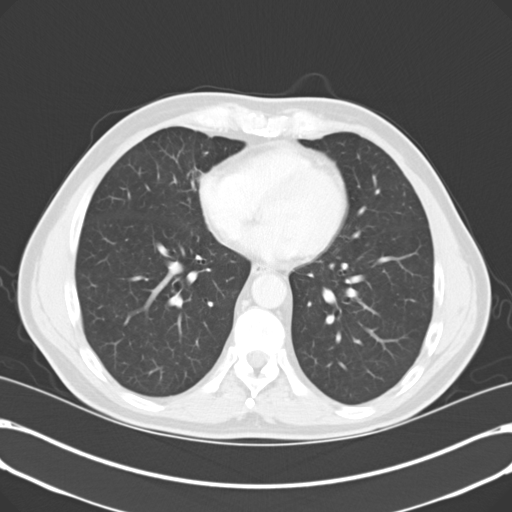
[im 31/69  lung]
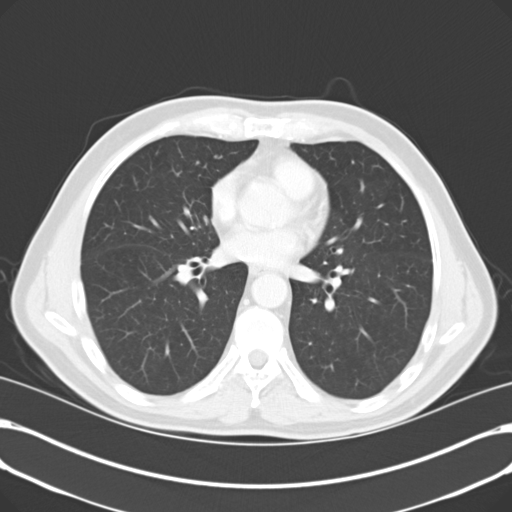
[im 38/69  lung]
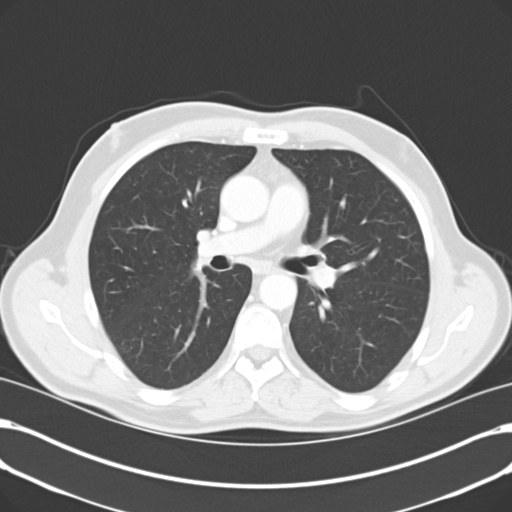
[im 43/69  lung]
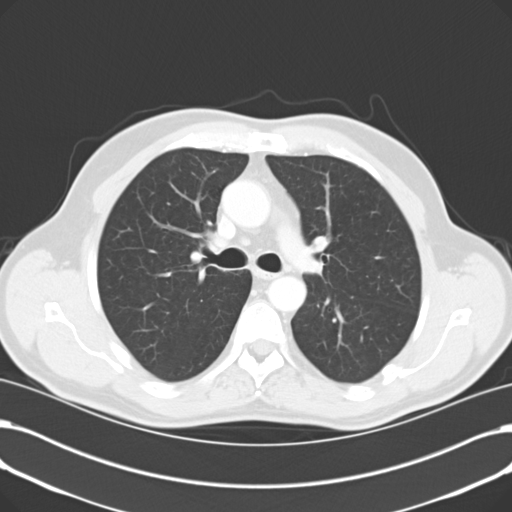
[im 48/69  mediastinal]
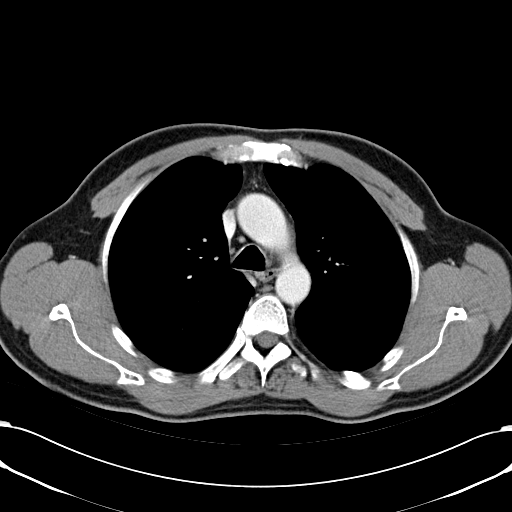
[im 48/69  lung]
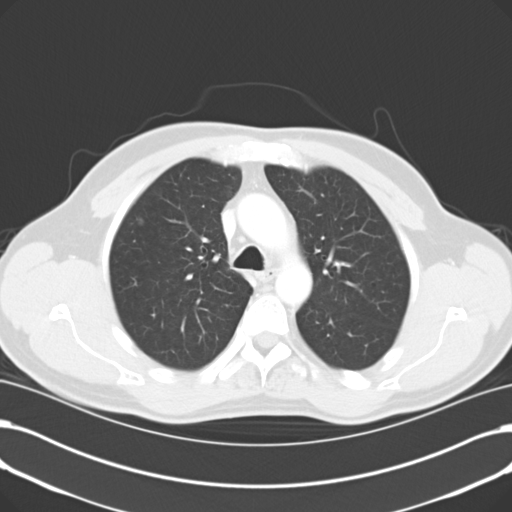
[im 53/69  lung]
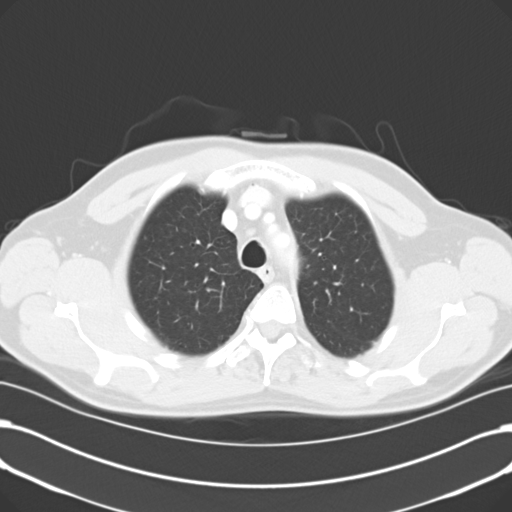
[im 58/69  lung]
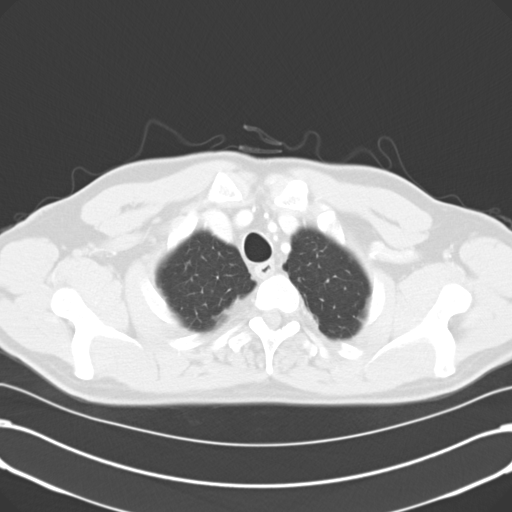
[im 63/69  lung]
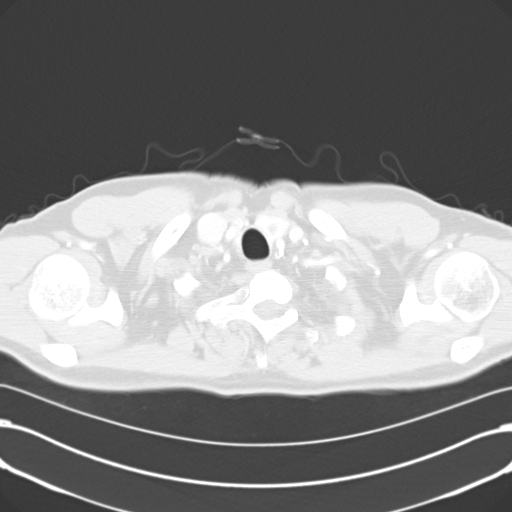

[Series 4: mpr coronal chest 3mm · coronal · 0.68mm/px · 3 of 81 slices shown]
[im 17/81  lung]
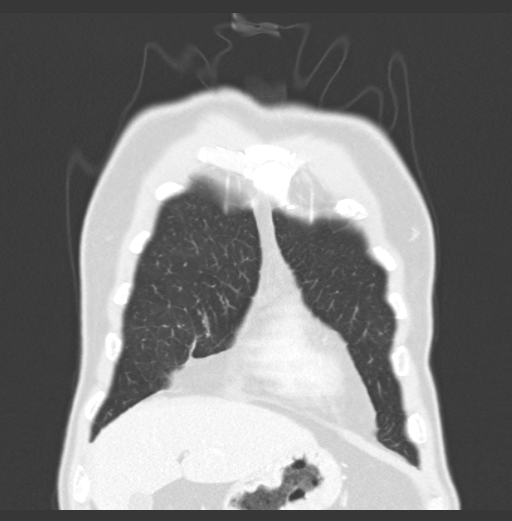
[im 33/81  lung]
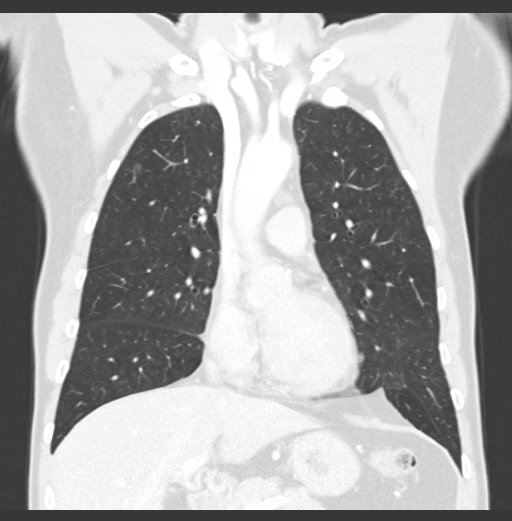
[im 49/81  lung]
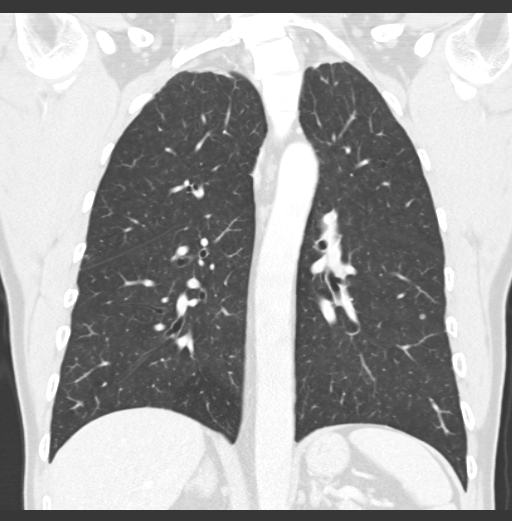

[15 of 36 positions shown; findings below may reference images not displayed]

FINDINGS: There are no enlarged mediastinal, hilar or axillary lymph nodes.
Early coronary artery calcifications are noted. There is trace
pericardial fluid or thickening. There is no significant pleural
effusion. There is a 9 mm low-density left thyroid nodule.

There are no suspicious nodules at the right lung base. The
questioned finding on the radiographs is due to a bone island within
the anterior aspect of the right seventh rib (image 51). There is
mild biapical scarring with a few scattered tiny subpleural nodules
in both lungs. Within the right upper lobe, there is a focal
ground-glass density measuring 5 mm on image 22. There is no
endobronchial lesion or confluent airspace opacity. Mild scarring is
present in the right middle lobe.

The visualized upper abdomen appears normal. There are no worrisome
osseous findings.
IMPRESSION: 1. There is no evidence of pulmonary nodule at the right lung base.
The radiographic finding is due to a bone island in the right
seventh rib.
2. There are few scattered small subpleural nodules as well as a 5
mm focal right upper lobe ground-glass density. If the patient is at
high risk for bronchogenic carcinoma, follow-up chest CT at 1 year
is recommended. If the patient is at low risk, no follow-up is
needed. This recommendation follows the consensus statement:
Guidelines for Management of Small Pulmonary Nodules Detected on CT
Scans: A Statement from the [HOSPITAL] as published in

## 2013-10-30 MED ORDER — IOHEXOL 300 MG/ML  SOLN
80.0000 mL | Freq: Once | INTRAMUSCULAR | Status: AC | PRN
  Administered 2013-10-30: 80 mL via INTRAVENOUS

## 2013-10-30 NOTE — Telephone Encounter (Signed)
Pt given results of CT Chest and reminded to quit smoking

## 2013-10-30 NOTE — Telephone Encounter (Signed)
Message copied by Koren Bound on Fri Oct 30, 2013  2:54 PM ------      Message from: [place], DONALD W      Created: Fri Oct 30, 2013  2:15 PM       Please call and let patient and his wife know the result of this report      Please schedule for next CT in 6 months instead of one year for the other small nodules that were found on the CT scan----let Remo Lipps know this      Remind patient that it is a good time to stop smoking completely ------

## 2014-01-12 ENCOUNTER — Other Ambulatory Visit: Payer: Self-pay | Admitting: Family Medicine

## 2014-01-23 ENCOUNTER — Other Ambulatory Visit: Payer: Self-pay | Admitting: *Deleted

## 2014-01-23 ENCOUNTER — Telehealth: Payer: Self-pay | Admitting: Family Medicine

## 2014-01-23 MED ORDER — PANTOPRAZOLE SODIUM 40 MG PO TBEC: DELAYED_RELEASE_TABLET | ORAL | Status: DC

## 2014-01-23 NOTE — Telephone Encounter (Signed)
Anne Ng states that she called for a refill on Bradley Goodman meds about 2 weeks ago but pharmacy says that they do not have. Per computer rx was sent in and pharmacy received. Resent to pharmacy per patients request. Advised to call back if pharmacy still states they did not receive

## 2014-01-27 ENCOUNTER — Encounter: Payer: Self-pay | Admitting: *Deleted

## 2014-03-03 ENCOUNTER — Ambulatory Visit: Payer: 59 | Admitting: Family Medicine

## 2014-03-05 ENCOUNTER — Encounter: Payer: Self-pay | Admitting: Family Medicine

## 2014-03-05 ENCOUNTER — Ambulatory Visit (INDEPENDENT_AMBULATORY_CARE_PROVIDER_SITE_OTHER): Payer: 59 | Admitting: Family Medicine

## 2014-03-05 VITALS — BP 140/98 | HR 95 | Temp 98.0°F | Ht 72.0 in | Wt 159.0 lb

## 2014-03-05 DIAGNOSIS — W57XXXA Bitten or stung by nonvenomous insect and other nonvenomous arthropods, initial encounter: Secondary | ICD-10-CM

## 2014-03-05 DIAGNOSIS — S30861A Insect bite (nonvenomous) of abdominal wall, initial encounter: Secondary | ICD-10-CM

## 2014-03-05 DIAGNOSIS — E785 Hyperlipidemia, unspecified: Secondary | ICD-10-CM

## 2014-03-05 DIAGNOSIS — I10 Essential (primary) hypertension: Secondary | ICD-10-CM

## 2014-03-05 DIAGNOSIS — R911 Solitary pulmonary nodule: Secondary | ICD-10-CM

## 2014-03-05 DIAGNOSIS — R7989 Other specified abnormal findings of blood chemistry: Secondary | ICD-10-CM

## 2014-03-05 DIAGNOSIS — N400 Enlarged prostate without lower urinary tract symptoms: Secondary | ICD-10-CM

## 2014-03-05 DIAGNOSIS — K219 Gastro-esophageal reflux disease without esophagitis: Secondary | ICD-10-CM

## 2014-03-05 DIAGNOSIS — E559 Vitamin D deficiency, unspecified: Secondary | ICD-10-CM

## 2014-03-05 DIAGNOSIS — S30860A Insect bite (nonvenomous) of lower back and pelvis, initial encounter: Secondary | ICD-10-CM

## 2014-03-05 LAB — POCT CBC
Granulocyte percent: 72.3 %G (ref 37–80)
HCT, POC: 46.5 % (ref 43.5–53.7)
HEMOGLOBIN: 14.9 g/dL (ref 14.1–18.1)
Lymph, poc: 1.8 (ref 0.6–3.4)
MCH: 28.3 pg (ref 27–31.2)
MCHC: 32.1 g/dL (ref 31.8–35.4)
MCV: 88.1 fL (ref 80–97)
MPV: 8.8 fL (ref 0–99.8)
POC Granulocyte: 5.8 (ref 2–6.9)
POC LYMPH %: 22.8 % (ref 10–50)
Platelet Count, POC: 192 10*3/uL (ref 142–424)
RBC: 5.3 M/uL (ref 4.69–6.13)
RDW, POC: 14.3 %
WBC: 8 10*3/uL (ref 4.6–10.2)

## 2014-03-05 MED ORDER — LISINOPRIL 10 MG PO TABS: 10.0000 mg | ORAL_TABLET | Freq: Every day | ORAL | Status: DC

## 2014-03-05 NOTE — Patient Instructions (Addendum)
Continue current medications. Continue good therapeutic lifestyle changes which include good diet and exercise. Fall precautions discussed with patient. If an FOBT was given today- please return it to our front desk. If you are over 57 years old - you may need Prevnar 79 or the adult Pneumonia vaccine.  Take blood pressure medication as directed Repeat BMP in 2-3 weeks Bring blood pressure readings from home to the blood draw Have nurse recheck blood pressure in this office when your blood is drawn  Tick Bite Information Ticks are insects that attach themselves to the skin and draw blood for food. There are various types of ticks. Common types include wood ticks and deer ticks. Most ticks live in shrubs and grassy areas. Ticks can climb onto your body when you make contact with leaves or grass where the tick is waiting. The most common places on the body for ticks to attach themselves are the scalp, neck, armpits, waist, and groin. Most tick bites are harmless, but sometimes ticks carry germs that cause diseases. These germs can be spread to a person during the tick's feeding process. The chance of a disease spreading through a tick bite depends on:   The type of tick.  Time of year.   How long the tick is attached.   Geographic location.  HOW CAN YOU PREVENT TICK BITES? Take these steps to help prevent tick bites when you are outdoors:  Wear protective clothing. Long sleeves and long pants are best.   Wear white clothes so you can see ticks more easily.  Tuck your pant legs into your socks.   If walking on a trail, stay in the middle of the trail to avoid brushing against bushes.  Avoid walking through areas with long grass.  Put insect repellent on all exposed skin and along boot tops, pant legs, and sleeve cuffs.   Check clothing, hair, and skin repeatedly and before going inside.   Brush off any ticks that are not attached.  Take a shower or bath as soon as  possible after being outdoors.  WHAT IS THE PROPER WAY TO REMOVE A TICK? Ticks should be removed as soon as possible to help prevent diseases caused by tick bites. 1. If latex gloves are available, put them on before trying to remove a tick.  2. Using fine-point tweezers, grasp the tick as close to the skin as possible. You may also use curved forceps or a tick removal tool. Grasp the tick as close to its head as possible. Avoid grasping the tick on its body. 3. Pull gently with steady upward pressure until the tick lets go. Do not twist the tick or jerk it suddenly. This may break off the tick's head or mouth parts. 4. Do not squeeze or crush the tick's body. This could force disease-carrying fluids from the tick into your body.  5. After the tick is removed, wash the bite area and your hands with soap and water or other disinfectant such as alcohol. 6. Apply a small amount of antiseptic cream or ointment to the bite site.  7. Wash and disinfect any instruments that were used.  Do not try to remove a tick by applying a hot match, petroleum jelly, or fingernail polish to the tick. These methods do not work and may increase the chances of disease being spread from the tick bite.  WHEN SHOULD YOU SEEK MEDICAL CARE? Contact your health care provider if you are unable to remove a tick from your skin or  if a part of the tick breaks off and is stuck in the skin.  After a tick bite, you need to be aware of signs and symptoms that could be related to diseases spread by ticks. Contact your health care provider if you develop any of the following in the days or weeks after the tick bite:  Unexplained fever.  Rash. A circular rash that appears days or weeks after the tick bite may indicate the possibility of Lyme disease. The rash may resemble a target with a bull's-eye and may occur at a different part of your body than the tick bite.  Redness and swelling in the area of the tick bite.   Tender,  swollen lymph glands.   Diarrhea.   Weight loss.   Cough.   Fatigue.   Muscle, joint, or bone pain.   Abdominal pain.   Headache.   Lethargy or a change in your level of consciousness.  Difficulty walking or moving your legs.   Numbness in the legs.   Paralysis.  Shortness of breath.   Confusion.   Repeated vomiting.  Document Released: 09/21/2000 Document Revised: 07/15/2013 Document Reviewed: 03/04/2013 [facility] Patient Information 2014 [place].

## 2014-03-05 NOTE — Progress Notes (Signed)
Subjective:    Patient ID: Bradley Goodman, male    DOB: [birth year], 57 y.o.   MRN: [id number]  HPI Patient comes in today for 4 month follow up on chronic medical conditions. He has no complaints. The patient indicates that his blood pressures at home have been running high similar to the ones here in the practice. He is due to return and FOBT and he needs to check with his insurance regarding the Prevnar vaccine. He will get lab work done today. We will start him on blood pressure medication and he will return to the office in 2-3 weeks for a BMP and repeat blood pressure.   Review of Systems  Constitutional: Negative.   HENT: Negative.   Eyes: Negative.   Respiratory: Negative.   Cardiovascular: Negative.   Gastrointestinal: Negative.   Endocrine: Negative.   Genitourinary: Negative.   Musculoskeletal: Negative.   Skin: Negative.   Allergic/Immunologic: Negative.   Neurological: Negative.   Hematological: Negative.   Psychiatric/Behavioral: Negative.        Objective:   Physical Exam  Nursing note and vitals reviewed. Constitutional: He is oriented to person, place, and time. He appears well-developed and well-nourished. No distress.  HENT:  Head: Normocephalic and atraumatic.  Right Ear: External ear normal.  Left Ear: External ear normal.  Nose: Nose normal.  Mouth/Throat: Oropharynx is clear and moist. No oropharyngeal exudate.  Eyes: Conjunctivae and EOM are normal. Pupils are equal, round, and reactive to light. Right eye exhibits no discharge. Left eye exhibits no discharge. No scleral icterus.  Neck: Normal range of motion. Neck supple. No thyromegaly present.  No carotid bruits  Cardiovascular: Normal rate, regular rhythm, normal heart sounds and intact distal pulses.  Exam reveals no gallop and no friction rub.   No murmur heard. At 84 per minute  Pulmonary/Chest: Effort normal and breath sounds normal. No respiratory distress. He has no wheezes. He has no  rales. He exhibits no tenderness.  No axillary nodes  Abdominal: Soft. Bowel sounds are normal. He exhibits no mass. There is no tenderness. There is no rebound and no guarding.  No inguinal nodes  Musculoskeletal: Normal range of motion. He exhibits no edema and no tenderness.  Lymphadenopathy:    He has no cervical adenopathy.  Neurological: He is alert and oriented to person, place, and time. He has normal reflexes. No cranial nerve deficit.  Skin: Skin is warm and dry. No rash noted. No erythema. No pallor.  In examining the patient a small dear tick was removed from the patient's abdomen which was found during the visit.  Psychiatric: He has a normal mood and affect. His behavior is normal. Judgment and thought content normal.   BP 146/93  Pulse 95  Temp(Src) 98 F (36.7 C) (Oral)  Ht 6' (1.829 m)  Wt 159 lb (72.122 kg)  BMI 21.56 kg/m2        Assessment & Plan:  1. GERD (gastroesophageal reflux disease)  2. Vitamin D deficiency - Vit D  25 hydroxy (rtn osteoporosis monitoring)  3. Lung nodule -A repeat CT scan is planned for July  4. BPH (benign prostatic hyperplasia)  5. Hypertension - BMP8+EGFR - POCT CBC - BMP8+EGFR; Future - lisinopril (PRINIVIL,ZESTRIL) 10 MG tablet; Take 1 tablet (10 mg total) by mouth daily.  Dispense: 90 tablet; Refill: 3  6. Hyperlipidemia - NMR, lipoprofile - Hepatic function panel  7. Tick bite of abdomen -Tick bite information will be given to patient  Patient Instructions  Continue current medications. Continue good therapeutic lifestyle changes which include good diet and exercise. Fall precautions discussed with patient. If an FOBT was given today- please return it to our front desk. If you are over 78 years old - you may need Prevnar 105 or the adult Pneumonia vaccine.  Take blood pressure medication as directed Repeat BMP in 2-3 weeks Bring blood pressure readings from home to the blood draw Have nurse recheck blood  pressure in this office when your blood is drawn  Tick Bite Information Ticks are insects that attach themselves to the skin and draw blood for food. There are various types of ticks. Common types include wood ticks and deer ticks. Most ticks live in shrubs and grassy areas. Ticks can climb onto your body when you make contact with leaves or grass where the tick is waiting. The most common places on the body for ticks to attach themselves are the scalp, neck, armpits, waist, and groin. Most tick bites are harmless, but sometimes ticks carry germs that cause diseases. These germs can be spread to a person during the tick's feeding process. The chance of a disease spreading through a tick bite depends on:   The type of tick.  Time of year.   How long the tick is attached.   Geographic location.  HOW CAN YOU PREVENT TICK BITES? Take these steps to help prevent tick bites when you are outdoors:  Wear protective clothing. Long sleeves and long pants are best.   Wear white clothes so you can see ticks more easily.  Tuck your pant legs into your socks.   If walking on a trail, stay in the middle of the trail to avoid brushing against bushes.  Avoid walking through areas with long grass.  Put insect repellent on all exposed skin and along boot tops, pant legs, and sleeve cuffs.   Check clothing, hair, and skin repeatedly and before going inside.   Brush off any ticks that are not attached.  Take a shower or bath as soon as possible after being outdoors.  WHAT IS THE PROPER WAY TO REMOVE A TICK? Ticks should be removed as soon as possible to help prevent diseases caused by tick bites. 1. If latex gloves are available, put them on before trying to remove a tick.  2. Using fine-point tweezers, grasp the tick as close to the skin as possible. You may also use curved forceps or a tick removal tool. Grasp the tick as close to its head as possible. Avoid grasping the tick on its  body. 3. Pull gently with steady upward pressure until the tick lets go. Do not twist the tick or jerk it suddenly. This may break off the tick's head or mouth parts. 4. Do not squeeze or crush the tick's body. This could force disease-carrying fluids from the tick into your body.  5. After the tick is removed, wash the bite area and your hands with soap and water or other disinfectant such as alcohol. 6. Apply a small amount of antiseptic cream or ointment to the bite site.  7. Wash and disinfect any instruments that were used.  Do not try to remove a tick by applying a hot match, petroleum jelly, or fingernail polish to the tick. These methods do not work and may increase the chances of disease being spread from the tick bite.  WHEN SHOULD YOU SEEK MEDICAL CARE? Contact your health care provider if you are unable to remove a tick from your skin  or if a part of the tick breaks off and is stuck in the skin.  After a tick bite, you need to be aware of signs and symptoms that could be related to diseases spread by ticks. Contact your health care provider if you develop any of the following in the days or weeks after the tick bite:  Unexplained fever.  Rash. A circular rash that appears days or weeks after the tick bite may indicate the possibility of Lyme disease. The rash may resemble a target with a bull's-eye and may occur at a different part of your body than the tick bite.  Redness and swelling in the area of the tick bite.   Tender, swollen lymph glands.   Diarrhea.   Weight loss.   Cough.   Fatigue.   Muscle, joint, or bone pain.   Abdominal pain.   Headache.   Lethargy or a change in your level of consciousness.  Difficulty walking or moving your legs.   Numbness in the legs.   Paralysis.  Shortness of breath.   Confusion.   Repeated vomiting.  Document Released: 09/21/2000 Document Revised: 07/15/2013 Document Reviewed: 03/04/2013 [facility]  Patient Information 2014 [place].    Arrie Senate MD

## 2014-03-06 LAB — BMP8+EGFR
BUN / CREAT RATIO: 18 (ref 9–20)
BUN: 18 mg/dL (ref 6–24)
CHLORIDE: 106 mmol/L (ref 97–108)
CO2: 24 mmol/L (ref 18–29)
Calcium: 9.9 mg/dL (ref 8.7–10.2)
Creatinine, Ser: 1.01 mg/dL (ref 0.76–1.27)
GFR calc non Af Amer: 83 mL/min/{1.73_m2} (ref >59)
GFR, EST AFRICAN AMERICAN: 96 mL/min/{1.73_m2} (ref >59)
Glucose: 105 mg/dL — ABNORMAL HIGH (ref 65–99)
POTASSIUM: 4.9 mmol/L (ref 3.5–5.2)
SODIUM: 145 mmol/L — AB (ref 134–144)

## 2014-03-06 LAB — NMR, LIPOPROFILE
CHOLESTEROL: 101 mg/dL (ref 100–199)
HDL Cholesterol by NMR: 34 mg/dL — ABNORMAL LOW (ref >39)
HDL PARTICLE NUMBER: 31.5 umol/L (ref >=30.5)
LDL Particle Number: 701 nmol/L (ref <1000)
LDL SIZE: 20 nm (ref >20.5)
LDLC SERPL CALC-MCNC: 47 mg/dL (ref 0–99)
LP-IR SCORE: 61 — AB (ref <=45)
SMALL LDL PARTICLE NUMBER: 475 nmol/L (ref <=527)
Triglycerides by NMR: 98 mg/dL (ref 0–149)

## 2014-03-06 LAB — HEPATIC FUNCTION PANEL
ALT: 17 IU/L (ref 0–44)
AST: 13 IU/L (ref 0–40)
Albumin: 4.3 g/dL (ref 3.5–5.5)
Alkaline Phosphatase: 70 IU/L (ref 39–117)
BILIRUBIN DIRECT: 0.08 mg/dL (ref 0.00–0.40)
Total Bilirubin: <0.2 mg/dL (ref 0.0–1.2)
Total Protein: 6.3 g/dL (ref 6.0–8.5)

## 2014-03-06 LAB — VITAMIN D 25 HYDROXY (VIT D DEFICIENCY, FRACTURES): VIT D 25 HYDROXY: 60 ng/mL (ref 30.0–100.0)

## 2014-03-08 ENCOUNTER — Telehealth: Payer: Self-pay | Admitting: Family Medicine

## 2014-03-08 NOTE — Telephone Encounter (Signed)
Message copied by Waverly Ferrari on Mon Mar 08, 2014  8:39 AM ------      Message from: Chipper Herb      Created: Sat Mar 06, 2014  7:37 AM       The blood sugar is elevated at 105. The creatinine, most important kidney function test is within normal limits. The electrolytes including potassium are within normal limits.LAB----please at a hemoglobin A1c      With advanced lipid testing, the total LDL [place] number is excellent and at goal at 701. The LDL C. was also good 47. Triglycerides are good. The HDL particle number is good.------ continue current treatment and as aggressive therapeutic lifestyle changes as possible      All liver function tests are within normal limits      The vitamin D level is excellent at 60, continue current treatment ------

## 2014-03-09 LAB — POCT GLYCOSYLATED HEMOGLOBIN (HGB A1C): Hemoglobin A1C: 5.6

## 2014-03-09 NOTE — Addendum Note (Signed)
Addended by: Pollyann Kennedy F on: 03/09/2014 12:02 PM   Modules accepted: Orders

## 2014-04-29 ENCOUNTER — Telehealth: Payer: Self-pay

## 2014-04-29 ENCOUNTER — Other Ambulatory Visit: Payer: Self-pay | Admitting: Family Medicine

## 2014-04-29 DIAGNOSIS — Z87898 Personal history of other specified conditions: Secondary | ICD-10-CM

## 2014-05-03 ENCOUNTER — Ambulatory Visit (HOSPITAL_COMMUNITY)
Admission: RE | Admit: 2014-05-03 | Discharge: 2014-05-03 | Disposition: A | Discharge status: Home / Self Care | Payer: 59 | Source: Ambulatory Visit | Attending: Family Medicine | Admitting: Family Medicine

## 2014-05-03 DIAGNOSIS — Z87898 Personal history of other specified conditions: Secondary | ICD-10-CM

## 2014-05-03 DIAGNOSIS — R911 Solitary pulmonary nodule: Secondary | ICD-10-CM | POA: Insufficient documentation

## 2014-05-03 DIAGNOSIS — J438 Other emphysema: Secondary | ICD-10-CM | POA: Insufficient documentation

## 2014-05-03 IMAGING — CT CT CHEST W/O CM
2 of 3 series · 15 of 36 positions shown, 18 images · non-contrast
Comparison: CT chest [DATE].

CLINICAL DATA: Interval followup of a ground-glass nodule in the
right upper lobe and scattered subpleural nodules in both lungs.

EXAM:
CT CHEST WITHOUT CONTRAST
TECHNIQUE: Multidetector CT imaging of the chest was performed following the
standard protocol without IV contrast..

[Series 2: chestroutine 5.0 b40f · axial · 0.66mm/px · z∈[-348,-48]mm · 12 of 72 slices shown, 15 images]
[im 6/72  mediastinal]
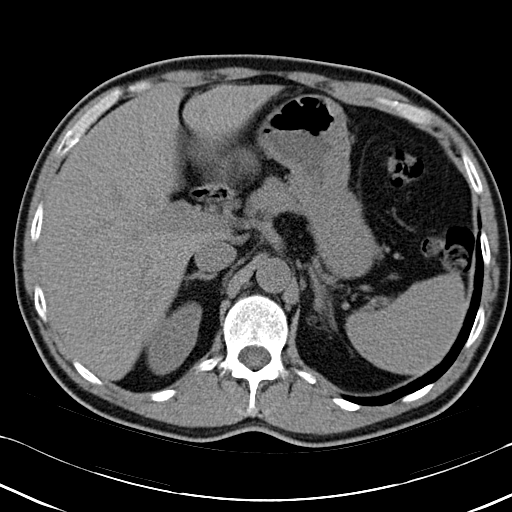
[im 6/72  lung]
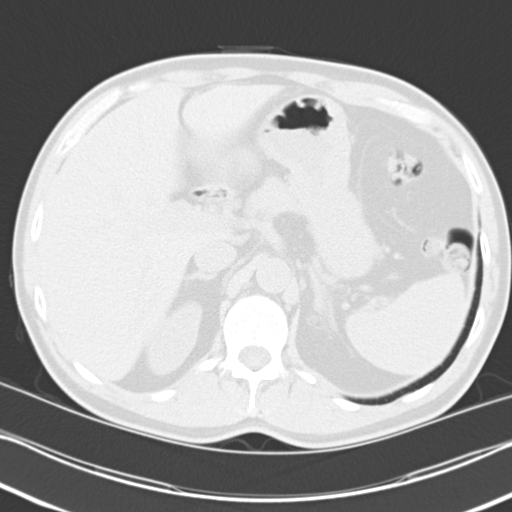
[im 11/72  lung]
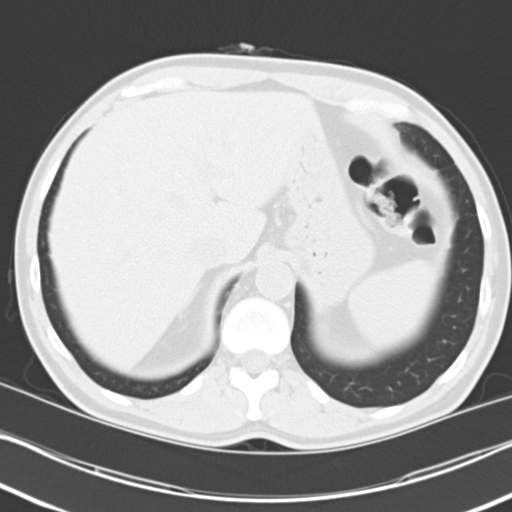
[im 16/72  lung]
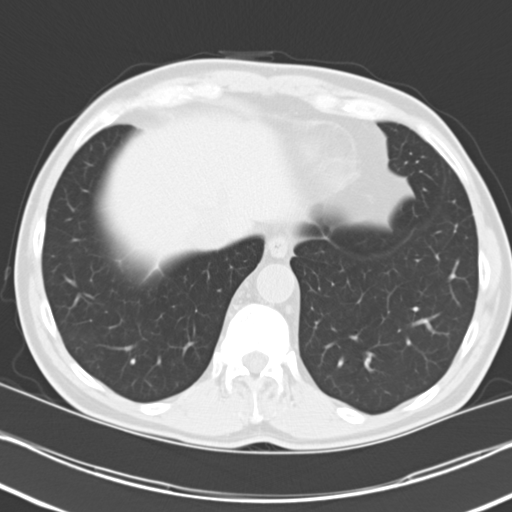
[im 22/72  lung]
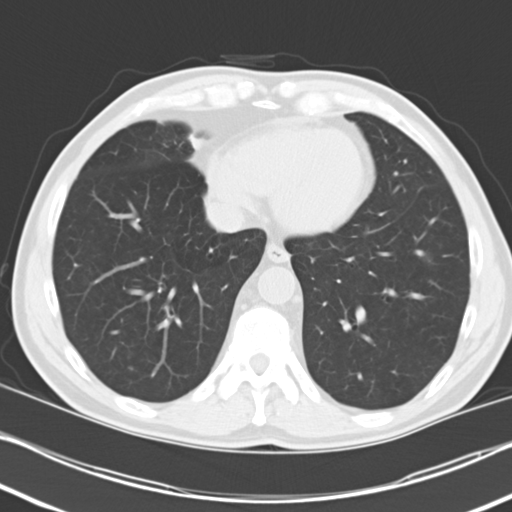
[im 27/72  mediastinal]
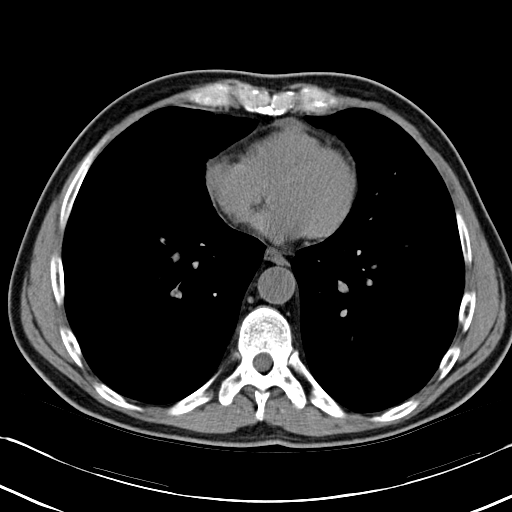
[im 27/72  lung]
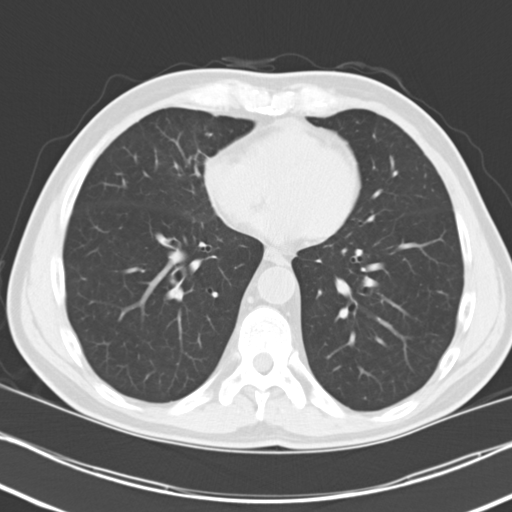
[im 32/72  lung]
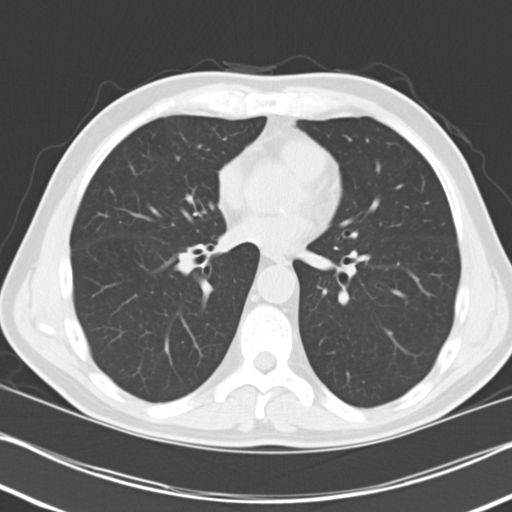
[im 40/72  lung]
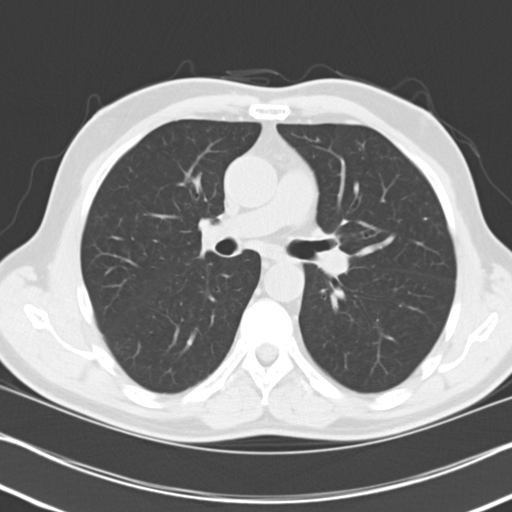
[im 45/72  lung]
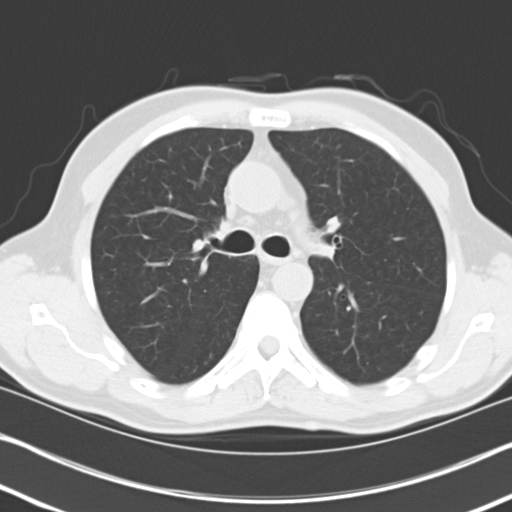
[im 50/72  mediastinal]
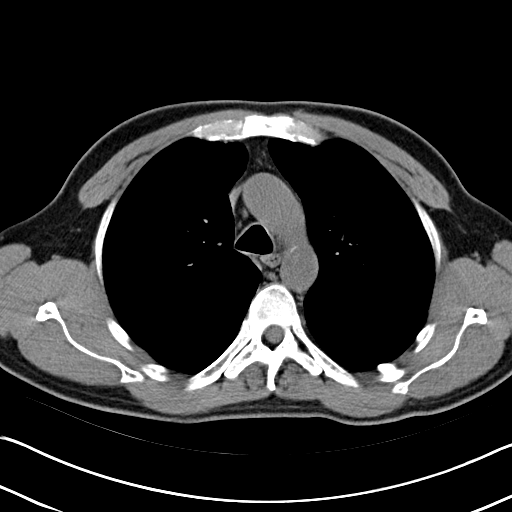
[im 50/72  lung]
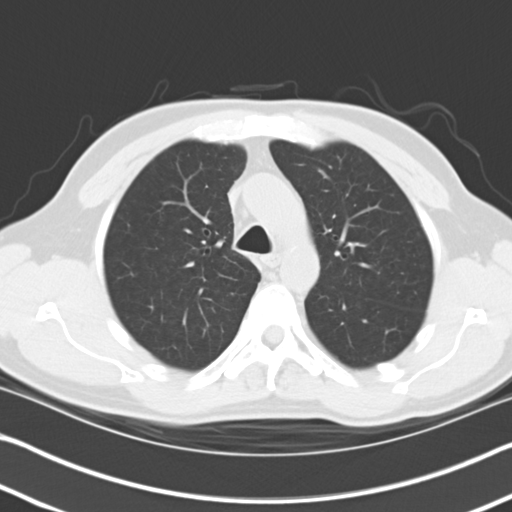
[im 56/72  lung]
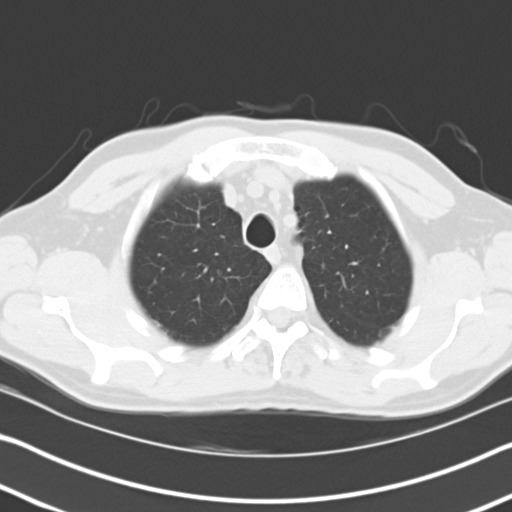
[im 61/72  lung]
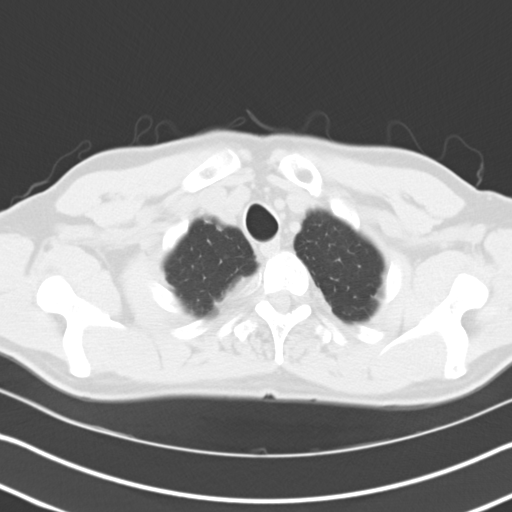
[im 66/72  lung]
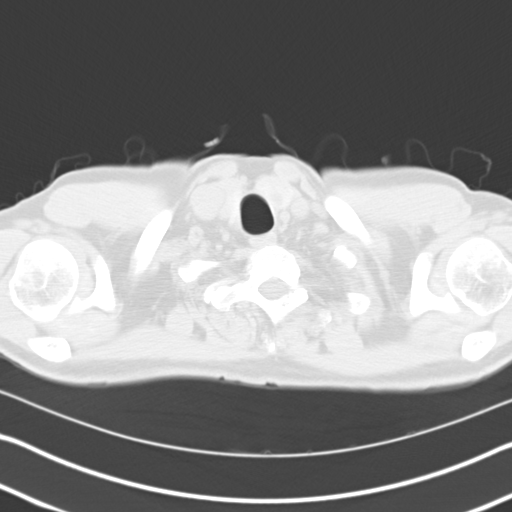

[Series 4: mpr coro 3mm · coronal · 0.69mm/px · 3 of 78 slices shown]
[im 16/78  lung]
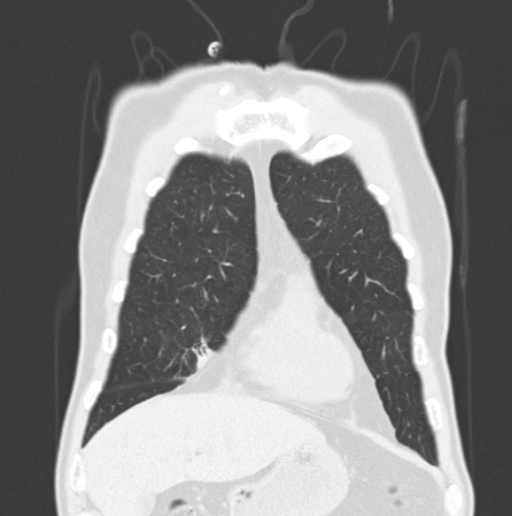
[im 31/78  lung]
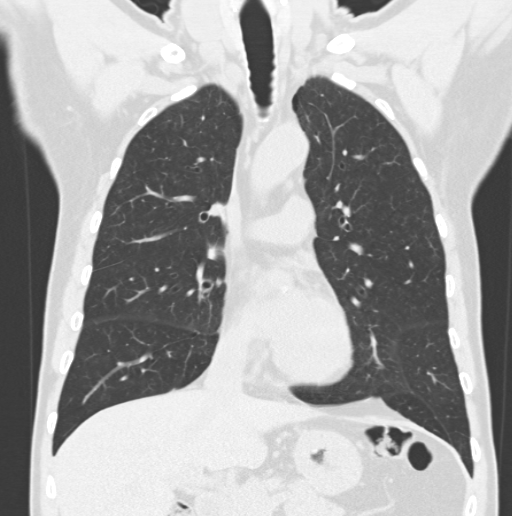
[im 47/78  lung]
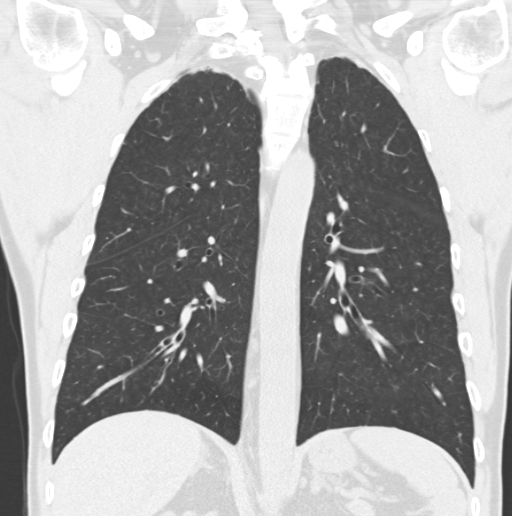

[15 of 36 positions shown; findings below may reference images not displayed]

FINDINGS: Previously identified ground-glass nodule in the lateral right upper
lobe (series 3, image 24), unchanged, still measuring approximately
6 mm. 3 mm nodule in the lateral left lower lobe (image 44),
unchanged. 5 mm ground-glass nodule in the lateral left upper lobe,
unchanged. No new or enlarging pulmonary nodules. Emphysematous
changes throughout both lungs, unchanged. Scarring and
bronchiectasis in the right middle lobe, unchanged. Lungs otherwise
clear. No pleural effusions. Central airways patent with minimal
bronchial wall thickening.

No significant mediastinal, hilar, or axillary lymphadenopathy.
Stable approximate 9 mm nodule in the lower pole of the left lobe of
the thyroid gland.

Heart size normal and stable. Mild LAD coronary atherosclerosis.
Mild pericardial thickening at the base, unchanged. Minimal mild
atherosclerosis involving the thoracic and upper abdominal aorta
without aneurysm.

Visualized upper abdomen unremarkable. Bone window images
unremarkable.
IMPRESSION: 1. Stable 6 mm ground-glass nodule in the lateral right upper lobe
and stable 5 mm ground-glass nodule in the lateral left upper lobe
since the examination [DATE].
2. Stable 3 mm nodule in the lateral left upper lobe.
3. No new or enlarging pulmonary nodules.
4. COPD/emphysema.  No acute cardiopulmonary disease.
Annual CT surveillance of the ground-glass nodules in the upper
lobes is recommended for a total of 3 years. This recommendation
follows the consensus statement: Recommendations for the Management
of Subsolid Pulmonary Nodules Detected at CT: A Statement from the

## 2014-05-04 ENCOUNTER — Telehealth: Payer: Self-pay

## 2014-05-04 NOTE — Telephone Encounter (Signed)
Message copied by Koren Bound on Tue May 04, 2014  9:46 AM ------      Message from: Chipper Herb      Created: Mon May 03, 2014  1:08 PM       As per radiology report------ please let patient know of these results and that he will need annual CT scans for 3 years for stability, also please put this on your schedule for him to get these done ------

## 2014-05-04 NOTE — Telephone Encounter (Signed)
Pt aware of Ct results and the need for f/u  In 2016 and 2017. Put on reminder list for both of these dates

## 2014-05-07 ENCOUNTER — Ambulatory Visit (HOSPITAL_COMMUNITY): Payer: 59

## 2014-07-13 ENCOUNTER — Ambulatory Visit (INDEPENDENT_AMBULATORY_CARE_PROVIDER_SITE_OTHER): Payer: 59 | Admitting: Family Medicine

## 2014-07-13 ENCOUNTER — Encounter: Payer: Self-pay | Admitting: Family Medicine

## 2014-07-13 VITALS — BP 169/108 | HR 109 | Temp 97.7°F | Ht 72.0 in | Wt 160.0 lb

## 2014-07-13 DIAGNOSIS — K219 Gastro-esophageal reflux disease without esophagitis: Secondary | ICD-10-CM

## 2014-07-13 DIAGNOSIS — I10 Essential (primary) hypertension: Secondary | ICD-10-CM

## 2014-07-13 DIAGNOSIS — N400 Enlarged prostate without lower urinary tract symptoms: Secondary | ICD-10-CM

## 2014-07-13 DIAGNOSIS — E559 Vitamin D deficiency, unspecified: Secondary | ICD-10-CM

## 2014-07-13 DIAGNOSIS — E785 Hyperlipidemia, unspecified: Secondary | ICD-10-CM

## 2014-07-13 DIAGNOSIS — E119 Type 2 diabetes mellitus without complications: Secondary | ICD-10-CM

## 2014-07-13 LAB — POCT UA - MICROALBUMIN: Microalbumin Ur, POC: NEGATIVE mg/L

## 2014-07-13 LAB — POCT CBC
GRANULOCYTE PERCENT: 73.4 % (ref 37–80)
HEMATOCRIT: 45.7 % (ref 43.5–53.7)
Hemoglobin: 14.9 g/dL (ref 14.1–18.1)
Lymph, poc: 2.1 (ref 0.6–3.4)
MCH, POC: 28.6 pg (ref 27–31.2)
MCHC: 32.7 g/dL (ref 31.8–35.4)
MCV: 87.5 fL (ref 80–97)
MPV: 8.6 fL (ref 0–99.8)
PLATELET COUNT, POC: 193 10*3/uL (ref 142–424)
POC Granulocyte: 6.1 (ref 2–6.9)
POC LYMPH PERCENT: 24.8 %L (ref 10–50)
RBC: 5.2 M/uL (ref 4.69–6.13)
RDW, POC: 13.8 %
WBC: 8.3 10*3/uL (ref 4.6–10.2)

## 2014-07-13 LAB — POCT URINALYSIS DIPSTICK
Bilirubin, UA: NEGATIVE
Glucose, UA: NEGATIVE
Ketones, UA: NEGATIVE
Leukocytes, UA: NEGATIVE
Nitrite, UA: NEGATIVE
PH UA: 6.5
PROTEIN UA: NEGATIVE
Spec Grav, UA: 1.01
UROBILINOGEN UA: NEGATIVE

## 2014-07-13 LAB — POCT UA - MICROSCOPIC ONLY
BACTERIA, U MICROSCOPIC: NEGATIVE
CASTS, UR, LPF, POC: NEGATIVE
CRYSTALS, UR, HPF, POC: NEGATIVE
MUCUS UA: NEGATIVE
Yeast, UA: NEGATIVE

## 2014-07-13 LAB — POCT GLYCOSYLATED HEMOGLOBIN (HGB A1C): Hemoglobin A1C: 5.3

## 2014-07-13 MED ORDER — PANTOPRAZOLE SODIUM 40 MG PO TBEC: DELAYED_RELEASE_TABLET | ORAL | Status: DC

## 2014-07-13 NOTE — Patient Instructions (Addendum)
Continue current medications. Continue good therapeutic lifestyle changes which include good diet and exercise. Fall precautions discussed with patient. If an FOBT was given today- please return it to our front desk. If you are over 57 years old - you may need Prevnar 38 or the adult Pneumonia vaccine.  Flu Shots will be available at our office starting mid- September. Please call and schedule a FLU CLINIC APPOINTMENT.   Continue to monitor blood pressures at home Continue to try to stop smoking For a short period of time, take ibuprofen 2-3 times daily after eating for 7-10 days to see if this helps the elbow discomfort The sure and check with your insurance so that we can give you the Prevnar vaccine Do not forget to get your eye exam Check your blood sugars regularly and check your feet regularly

## 2014-07-13 NOTE — Progress Notes (Signed)
Subjective:    Patient ID: Bradley Goodman, male    DOB: 10-31-1955, 58 y.o.   MRN: [id number]  HPI Pt here for follow up and management of chronic medical problems. His wife was seen in the office today and reports that his blood pressures at home are always excellent. She forgot to bring the readings in with him to the visit today. She will bring these back by her convenience. The patient is due to have a PSA prostate exam today. He hasn't FOBT at home which he has not returned. He will get lab work today. He will check with his insurance regarding the Prevnar vaccine. He also needs to have a urine done today. He needs a refill on his pantoprazole. He does complain of problems with his elbow.         Patient Active Problem List   Diagnosis Date Noted  . GERD (gastroesophageal reflux disease) 06/24/2013  . Diabetes mellitus type 2 controlled 06/24/2013  . Hyperlipidemia 06/24/2013  . Hypertension 06/24/2013   Outpatient Encounter Prescriptions as of 07/13/2014  Medication Sig  . aspirin EC 81 MG tablet Take 81 mg by mouth every evening.  . cholecalciferol (VITAMIN D) 1000 UNITS tablet Take 2,000 Units by mouth every evening. Pt takes two tablets daily  . CRESTOR 20 MG tablet TAKE 1 TABLET BY MOUTH DAILY  . Cyanocobalamin (B-12) 5000 MCG SUBL Place 1 tablet under the tongue every evening.  Javier Docker Oil 300 MG CAPS Take 1 capsule by mouth every evening.  . loratadine (CLARITIN) 10 MG tablet Take 10 mg by mouth every morning.  . Multiple Vitamin (MULTIVITAMIN) tablet Take 1 tablet by mouth every evening.   . pantoprazole (PROTONIX) 40 MG tablet TAKE 1 TABLET BY MOUTH DAILY  . lisinopril (PRINIVIL,ZESTRIL) 10 MG tablet Take 1 tablet (10 mg total) by mouth daily.    Review of Systems  Constitutional: Negative.   HENT: Negative.   Eyes: Negative.   Respiratory: Negative.   Cardiovascular: Negative.   Gastrointestinal: Negative.   Endocrine: Negative.   Genitourinary: Negative.    Musculoskeletal: Negative.        Left elbow discomfort at times/ decrease in grip  Skin: Negative.   Allergic/Immunologic: Negative.   Neurological: Negative.   Hematological: Negative.   Psychiatric/Behavioral: Negative.        Objective:   Physical Exam  Nursing note and vitals reviewed. Constitutional: He is oriented to person, place, and time. He appears well-developed and well-nourished. No distress.  The patient is alert and somewhat anxious as usual  HENT:  Head: Normocephalic and atraumatic.  Right Ear: External ear normal.  Left Ear: External ear normal.  Nose: Nose normal.  Mouth/Throat: Oropharynx is clear and moist. No oropharyngeal exudate.  There is nasal congestion bilaterally and there are some teeth missing and a tooth in the right lower jaw that appears to have a cavity  Eyes: Conjunctivae and EOM are normal. Pupils are equal, round, and reactive to light. Right eye exhibits no discharge. Left eye exhibits no discharge. No scleral icterus.  Neck: Normal range of motion. Neck supple. No thyromegaly present.  No carotid bruits no adenopathy  Cardiovascular: Normal rate, regular rhythm, normal heart sounds and intact distal pulses.  Exam reveals no gallop and no friction rub.   No murmur heard. The heart is 84 per minute with a regular rate and rhythm  Pulmonary/Chest: Effort normal and breath sounds normal. No respiratory distress. He has no wheezes. He has no  rales. He exhibits no tenderness.  The axillary regions are negative for adenopathy and the chest wall is negative for masses  Abdominal: Soft. Bowel sounds are normal. He exhibits no mass. There is no tenderness. There is no rebound and no guarding.  Genitourinary: Rectum normal and penis normal.  The prostate is enlarged and soft without any lumps or masses. There are no rectal masses. There is no inguinal adenopathy. There no inguinal hernias. The external genitalia were normal  Musculoskeletal: Normal  range of motion. He exhibits no edema and no tenderness.  There was some slight left lateral forearm tenderness below the left lateral epicondyle.  Lymphadenopathy:    He has no cervical adenopathy.  Neurological: He is alert and oriented to person, place, and time. He has normal reflexes. No cranial nerve deficit.  Skin: Skin is warm and dry. No rash noted. No erythema. No pallor.  Psychiatric: He has a normal mood and affect. His behavior is normal. Judgment and thought content normal.   BP 137/100  Pulse 99  Temp(Src) 97.7 F (36.5 C) (Oral)  Ht 6' (1.829 m)  Wt 160 lb (72.576 kg)  BMI 21.70 kg/m2        Assessment & Plan:  1. Type 2 diabetes mellitus without complication - POCT CBC - POCT glycosylated hemoglobin (Hb A1C)  2. Gastroesophageal reflux disease, esophagitis presence not specified - POCT CBC  3. Hyperlipidemia - POCT CBC - NMR, lipoprofile  4. Essential hypertension - POCT CBC - BMP8+EGFR - Hepatic function panel  5. Vitamin D deficiency - Vit D  25 hydroxy (rtn osteoporosis monitoring)  6. BPH (benign prostatic hyperplasia) - PSA, total and free - POCT UA - Microscopic Only - POCT urinalysis dipstick - POCT UA - Microalbumin - Urine culture  Meds ordered this encounter  Medications  . pantoprazole (PROTONIX) 40 MG tablet    Sig: TAKE 1 TABLET BY MOUTH DAILY    Dispense:  90 tablet    Refill:  3   Patient Instructions  Continue current medications. Continue good therapeutic lifestyle changes which include good diet and exercise. Fall precautions discussed with patient. If an FOBT was given today- please return it to our front desk. If you are over 55 years old - you may need Prevnar 75 or the adult Pneumonia vaccine.  Flu Shots will be available at our office starting mid- September. Please call and schedule a FLU CLINIC APPOINTMENT.   Continue to monitor blood pressures at home Continue to try to stop smoking For a short period of  time, take ibuprofen 2-3 times daily after eating for 7-10 days to see if this helps the elbow discomfort The sure and check with your insurance so that we can give you the Prevnar vaccine Do not forget to get your eye exam Check your blood sugars regularly and check your feet regularly   Arrie Senate MD

## 2014-07-14 LAB — URINE CULTURE: Organism ID, Bacteria: NO GROWTH

## 2014-07-15 LAB — BMP8+EGFR
BUN/Creatinine Ratio: 13 (ref 9–20)
BUN: 13 mg/dL (ref 6–24)
CALCIUM: 9.5 mg/dL (ref 8.7–10.2)
CHLORIDE: 102 mmol/L (ref 97–108)
CO2: 21 mmol/L (ref 18–29)
Creatinine, Ser: 1 mg/dL (ref 0.76–1.27)
GFR calc Af Amer: 96 mL/min/{1.73_m2} (ref >59)
GFR calc non Af Amer: 83 mL/min/{1.73_m2} (ref >59)
GLUCOSE: 98 mg/dL (ref 65–99)
Potassium: 4.4 mmol/L (ref 3.5–5.2)
Sodium: 142 mmol/L (ref 134–144)

## 2014-07-15 LAB — NMR, LIPOPROFILE
CHOLESTEROL: 109 mg/dL (ref 100–199)
HDL CHOLESTEROL BY NMR: 29 mg/dL — AB (ref >39)
HDL PARTICLE NUMBER: 28.5 umol/L — AB (ref >=30.5)
LDL Particle Number: 900 nmol/L (ref <1000)
LDL Size: 19.9 nm (ref >20.5)
LDLC SERPL CALC-MCNC: 53 mg/dL (ref 0–99)
LP-IR SCORE: 67 — AB (ref <=45)
Small LDL Particle Number: 656 nmol/L — ABNORMAL HIGH (ref <=527)
TRIGLYCERIDES BY NMR: 134 mg/dL (ref 0–149)

## 2014-07-15 LAB — HEPATIC FUNCTION PANEL
ALK PHOS: 71 IU/L (ref 39–117)
ALT: 16 IU/L (ref 0–44)
AST: 13 IU/L (ref 0–40)
Albumin: 3.9 g/dL (ref 3.5–5.5)
Bilirubin, Direct: 0.07 mg/dL (ref 0.00–0.40)
TOTAL PROTEIN: 5.9 g/dL — AB (ref 6.0–8.5)
Total Bilirubin: 0.2 mg/dL (ref 0.0–1.2)

## 2014-07-15 LAB — PSA, TOTAL AND FREE
PSA FREE PCT: 15.2 %
PSA FREE: 0.38 ng/mL
PSA: 2.5 ng/mL (ref 0.0–4.0)

## 2014-07-15 LAB — VITAMIN D 25 HYDROXY (VIT D DEFICIENCY, FRACTURES): Vit D, 25-Hydroxy: 54.7 ng/mL (ref 30.0–100.0)

## 2014-07-21 ENCOUNTER — Telehealth: Payer: Self-pay | Admitting: Family Medicine

## 2014-07-21 NOTE — Telephone Encounter (Signed)
Message copied by Waverly Ferrari on Wed Jul 21, 2014  3:23 PM ------      Message from: Chipper Herb      Created: Thu Jul 15, 2014  7:22 AM       The blood sugar was good at 98. The creatinine, the most important kidney function test is within normal limits. The electrolytes including potassium are within normal      All liver function tests are within normal except 1 minimally decreased      With advanced lipid testing the total LDL particle number is good at goal. The LDL study is good. The triglycerides are within normal limits. The good cholesterol or the HDL particle number is low. This is improved by continued good diet habits and more exercise.      The vitamin D level is within normal limits, continue current treatment      The PSA is within normal limits and the rate of rise is less than 0.75 which is good. ------

## 2014-08-09 ENCOUNTER — Other Ambulatory Visit: Payer: Self-pay | Admitting: Family Medicine

## 2014-09-23 NOTE — Telephone Encounter (Signed)
Close encounter 

## 2014-11-24 ENCOUNTER — Ambulatory Visit: Payer: 59 | Admitting: Family Medicine

## 2015-01-03 ENCOUNTER — Encounter: Payer: Self-pay | Admitting: Internal Medicine

## 2015-04-05 ENCOUNTER — Ambulatory Visit (INDEPENDENT_AMBULATORY_CARE_PROVIDER_SITE_OTHER): Payer: 59 | Admitting: Family Medicine

## 2015-04-05 ENCOUNTER — Encounter: Payer: Self-pay | Admitting: Family Medicine

## 2015-04-05 VITALS — BP 154/104 | HR 88 | Temp 97.6°F | Ht 72.0 in | Wt 155.0 lb

## 2015-04-05 DIAGNOSIS — I10 Essential (primary) hypertension: Secondary | ICD-10-CM | POA: Diagnosis not present

## 2015-04-05 DIAGNOSIS — E785 Hyperlipidemia, unspecified: Secondary | ICD-10-CM | POA: Diagnosis not present

## 2015-04-05 DIAGNOSIS — E119 Type 2 diabetes mellitus without complications: Secondary | ICD-10-CM | POA: Diagnosis not present

## 2015-04-05 DIAGNOSIS — E559 Vitamin D deficiency, unspecified: Secondary | ICD-10-CM | POA: Diagnosis not present

## 2015-04-05 DIAGNOSIS — K219 Gastro-esophageal reflux disease without esophagitis: Secondary | ICD-10-CM | POA: Diagnosis not present

## 2015-04-05 DIAGNOSIS — N400 Enlarged prostate without lower urinary tract symptoms: Secondary | ICD-10-CM

## 2015-04-05 LAB — POCT CBC
Granulocyte percent: 73.3 %G (ref 37–80)
HCT, POC: 50.9 % (ref 43.5–53.7)
Hemoglobin: 15.8 g/dL (ref 14.1–18.1)
LYMPH, POC: 2.3 (ref 0.6–3.4)
MCH: 27.6 pg (ref 27–31.2)
MCHC: 31.2 g/dL — AB (ref 31.8–35.4)
MCV: 88.7 fL (ref 80–97)
MPV: 9.7 fL (ref 0–99.8)
PLATELET COUNT, POC: 195 10*3/uL (ref 142–424)
POC Granulocyte: 7.1 — AB (ref 2–6.9)
POC LYMPH %: 24 % (ref 10–50)
RBC: 5.74 M/uL (ref 4.69–6.13)
RDW, POC: 14.6 %
WBC: 9.7 10*3/uL (ref 4.6–10.2)

## 2015-04-05 LAB — POCT GLYCOSYLATED HEMOGLOBIN (HGB A1C): Hemoglobin A1C: 5.6

## 2015-04-05 MED ORDER — LOSARTAN POTASSIUM 50 MG PO TABS: 50.0000 mg | ORAL_TABLET | Freq: Every day | ORAL | Status: DC

## 2015-04-05 NOTE — Progress Notes (Signed)
Subjective:    Patient ID: Bradley Goodman, male    DOB: November 25, 1955, 59 y.o.   MRN: [id number]  HPI  Pt here for follow up and management of chronic medical problems which includes hypertension, hyperlipidemia, and diabetes. He is taking medications regularly. As patient's blood pressure always elevated in the office. His wife is a Marine scientist and she checks his blood pressures at home and they're always good at home. He forgot to bring the readings in for review today but will bring these said they can be scanned into the record. He is due to return an FOBT and will get lab work. The patient is requesting coming off of the lisinopril because he said a lot of bad reactions in the hospital of this and we will switch him to a different antihypertensive.        Patient Active Problem List   Diagnosis Date Noted  . GERD (gastroesophageal reflux disease) 06/24/2013  . Diabetes mellitus type 2 controlled 06/24/2013  . Hyperlipidemia 06/24/2013  . Hypertension 06/24/2013   Outpatient Encounter Prescriptions as of 04/05/2015  Medication Sig  . aspirin EC 81 MG tablet Take 81 mg by mouth every evening.  . cholecalciferol (VITAMIN D) 1000 UNITS tablet Take 2,000 Units by mouth every evening. Pt takes two tablets daily  . CRESTOR 20 MG tablet TAKE 1 TABLET BY MOUTH DAILY  . Cyanocobalamin (B-12) 5000 MCG SUBL Place 1 tablet under the tongue every evening.  Javier Docker Oil 300 MG CAPS Take 1 capsule by mouth every evening.  . loratadine (CLARITIN) 10 MG tablet Take 10 mg by mouth every morning.  . Multiple Vitamin (MULTIVITAMIN) tablet Take 1 tablet by mouth every evening.   . pantoprazole (PROTONIX) 40 MG tablet TAKE 1 TABLET BY MOUTH DAILY  . [DISCONTINUED] lisinopril (PRINIVIL,ZESTRIL) 10 MG tablet Take 1 tablet (10 mg total) by mouth daily.   No facility-administered encounter medications on file as of 04/05/2015.     Review of Systems  Constitutional: Negative.   HENT: Negative.   Eyes:  Negative.   Respiratory: Negative.   Cardiovascular: Negative.   Gastrointestinal: Negative.   Endocrine: Negative.   Genitourinary: Negative.   Musculoskeletal: Negative.   Skin: Negative.   Allergic/Immunologic: Negative.   Neurological: Negative.   Hematological: Negative.   Psychiatric/Behavioral: Negative.        Objective:   Physical Exam  Constitutional: He is oriented to person, place, and time. He appears well-developed and well-nourished. No distress.  HENT:  Head: Normocephalic and atraumatic.  Right Ear: External ear normal.  Left Ear: External ear normal.  Nose: Nose normal.  Mouth/Throat: Oropharynx is clear and moist. No oropharyngeal exudate.  Eyes: Conjunctivae and EOM are normal. Pupils are equal, round, and reactive to light. Right eye exhibits no discharge. Left eye exhibits no discharge. No scleral icterus.  Neck: Normal range of motion. Neck supple. No thyromegaly present.  Without bruits thyromegaly or anterior cervical adenopathy  Cardiovascular: Normal rate, regular rhythm, normal heart sounds and intact distal pulses.   No murmur heard. At 84/m  Pulmonary/Chest: Effort normal and breath sounds normal. No respiratory distress. He has no wheezes. He has no rales. He exhibits no tenderness.  Abdominal: Soft. Bowel sounds are normal. He exhibits no mass. There is no tenderness. There is no rebound and no guarding.  Musculoskeletal: Normal range of motion. He exhibits no edema.  Lymphadenopathy:    He has no cervical adenopathy.  Neurological: He is alert and oriented to person,  place, and time. He has normal reflexes. No cranial nerve deficit.  Skin: Skin is warm and dry. No rash noted.  Psychiatric: He has a normal mood and affect. His behavior is normal. Judgment and thought content normal.  Nursing note and vitals reviewed.  BP 154/104 mmHg  Pulse 88  Temp(Src) 97.6 F (36.4 C) (Oral)  Ht 6' (1.829 m)  Wt 155 lb (70.308 kg)  BMI 21.02  kg/m2        Assessment & Plan:  1. Type 2 diabetes mellitus without complication -The patient has not been checking his blood sugars regularly at home and he is encouraged to do this and bring readings to future visits and in 4 weeks when we review his blood pressure - POCT CBC - POCT glycosylated hemoglobin (Hb A1C)  2. Gastroesophageal reflux disease, esophagitis presence not specified -He only has occasional symptoms with GERD and this appears to be under as stable as control as usual. - POCT CBC - Hepatic function panel  3. Hyperlipidemia -He should continue with the Crestor and omega-3 fatty acids pending results of lab work - POCT CBC - NMR, lipoprofile  4. Essential hypertension -This patient I believe has white coat hypertension as home blood pressure readings are always good compared to those in the office and his home blood pressures are checked by his wife who is a Marine scientist. He will bring readings by for review and 3-4 weeks since we changed him today to an angiotensin receptor blocker. He will continue to watch his sodium intake. - POCT CBC - BMP8+EGFR - Hepatic function panel  5. Vitamin D deficiency -Continue current treatment pending results of lab work - POCT CBC - Vit D  25 hydroxy (rtn osteoporosis monitoring)  6. BPH (benign prostatic hyperplasia) -Patient is having no symptoms with voiding and no specific treatment will be taken regarding this problem today. - POCT CBC  Meds ordered this encounter  Medications  . losartan (COZAAR) 50 MG tablet    Sig: Take 1 tablet (50 mg total) by mouth daily.    Dispense:  90 tablet    Refill:  3   Patient Instructions                       Medicare Annual Wellness Visit  [facility] and the medical providers at [facility] strive to bring you the best medical care.  In doing so we not only want to address your current medical conditions and concerns but also to detect new conditions early and  prevent illness, disease and health-related problems.    Medicare offers a yearly Wellness Visit which allows our clinical staff to assess your need for preventative services including immunizations, lifestyle education, counseling to decrease risk of preventable diseases and screening for fall risk and other medical concerns.    This visit is provided free of charge (no copay) for all Medicare recipients. The clinical pharmacists at [facility] have begun to conduct these Wellness Visits which will also include a thorough review of all your medications.    As you primary medical provider recommend that you make an appointment for your Annual Wellness Visit if you have not done so already this year.  You may set up this appointment before you leave today or you may call back ([phone]) and schedule an appointment.  Please make sure when you call that you mention that you are scheduling your Annual Wellness Visit with the clinical pharmacist  so that the appointment may be made for the proper length of time.     Continue current medications. Continue good therapeutic lifestyle changes which include good diet and exercise. Fall precautions discussed with patient. If an FOBT was given today- please return it to our front desk. If you are over 60 years old - you may need Prevnar 50 or the adult Pneumonia vaccine.  Flu Shots are still available at our office. If you still haven't had one please call to set up a nurse visit to get one.   After your visit with [place] today you will receive a survey in the mail or online from [employer] regarding your care with [place]. Please take a moment to fill this out. Your feedback is very important to [place] as you can help [place] better understand your patient needs as well as improve your experience and satisfaction. WE CARE ABOUT YOU!!!   We will change the blood pressure medicine to an angiotensin receptor blocker and we asked the patient to record  readings at home and bring these readings in for review in 4 weeks and he will discontinue his lisinopril. He should continue to watch his sodium intake He should monitor his blood sugars more closely and always bring some readings in for review when he comes for his regular visit Bring in some blood pressure and blood sugar readings for review in 4 weeks   Arrie Senate MD

## 2015-04-05 NOTE — Patient Instructions (Addendum)
Medicare Annual Wellness Visit  [facility] and the medical providers at [facility] strive to bring you the best medical care.  In doing so we not only want to address your current medical conditions and concerns but also to detect new conditions early and prevent illness, disease and health-related problems.    Medicare offers a yearly Wellness Visit which allows our clinical staff to assess your need for preventative services including immunizations, lifestyle education, counseling to decrease risk of preventable diseases and screening for fall risk and other medical concerns.    This visit is provided free of charge (no copay) for all Medicare recipients. The clinical pharmacists at [facility] have begun to conduct these Wellness Visits which will also include a thorough review of all your medications.    As you primary medical provider recommend that you make an appointment for your Annual Wellness Visit if you have not done so already this year.  You may set up this appointment before you leave today or you may call back ([phone]) and schedule an appointment.  Please make sure when you call that you mention that you are scheduling your Annual Wellness Visit with the clinical pharmacist so that the appointment may be made for the proper length of time.     Continue current medications. Continue good therapeutic lifestyle changes which include good diet and exercise. Fall precautions discussed with patient. If an FOBT was given today- please return it to our front desk. If you are over 81 years old - you may need Prevnar 76 or the adult Pneumonia vaccine.  Flu Shots are still available at our office. If you still haven't had one please call to set up a nurse visit to get one.   After your visit with [place] today you will receive a survey in the mail or online from [employer] regarding your care with [place]. Please take a moment to  fill this out. Your feedback is very important to [place] as you can help [place] better understand your patient needs as well as improve your experience and satisfaction. WE CARE ABOUT YOU!!!   We will change the blood pressure medicine to an angiotensin receptor blocker and we asked the patient to record readings at home and bring these readings in for review in 4 weeks and he will discontinue his lisinopril. He should continue to watch his sodium intake He should monitor his blood sugars more closely and always bring some readings in for review when he comes for his regular visit Bring in some blood pressure and blood sugar readings for review in 4 weeks

## 2015-04-06 LAB — BMP8+EGFR
BUN/Creatinine Ratio: 13 (ref 9–20)
BUN: 13 mg/dL (ref 6–24)
CALCIUM: 9.5 mg/dL (ref 8.7–10.2)
CHLORIDE: 101 mmol/L (ref 97–108)
CO2: 23 mmol/L (ref 18–29)
CREATININE: 1.04 mg/dL (ref 0.76–1.27)
GFR calc Af Amer: 92 mL/min/{1.73_m2} (ref >59)
GFR calc non Af Amer: 79 mL/min/{1.73_m2} (ref >59)
GLUCOSE: 109 mg/dL — AB (ref 65–99)
Potassium: 4.9 mmol/L (ref 3.5–5.2)
SODIUM: 141 mmol/L (ref 134–144)

## 2015-04-06 LAB — HEPATIC FUNCTION PANEL
ALT: 18 IU/L (ref 0–44)
AST: 14 IU/L (ref 0–40)
Albumin: 4.4 g/dL (ref 3.5–5.5)
Alkaline Phosphatase: 70 IU/L (ref 39–117)
BILIRUBIN TOTAL: 0.3 mg/dL (ref 0.0–1.2)
BILIRUBIN, DIRECT: 0.13 mg/dL (ref 0.00–0.40)
Total Protein: 6.5 g/dL (ref 6.0–8.5)

## 2015-04-06 LAB — VITAMIN D 25 HYDROXY (VIT D DEFICIENCY, FRACTURES): Vit D, 25-Hydroxy: 61.1 ng/mL (ref 30.0–100.0)

## 2015-04-06 LAB — NMR, LIPOPROFILE
CHOLESTEROL: 119 mg/dL (ref 100–199)
HDL Cholesterol by NMR: 35 mg/dL — ABNORMAL LOW (ref >39)
HDL PARTICLE NUMBER: 28.1 umol/L — AB (ref >=30.5)
LDL Particle Number: 917 nmol/L (ref <1000)
LDL Size: 19.8 nm (ref >20.5)
LDL-C: 65 mg/dL (ref 0–99)
LP-IR Score: 64 — ABNORMAL HIGH (ref <=45)
Small LDL Particle Number: 681 nmol/L — ABNORMAL HIGH (ref <=527)
TRIGLYCERIDES BY NMR: 95 mg/dL (ref 0–149)

## 2015-06-15 ENCOUNTER — Other Ambulatory Visit: Payer: Self-pay | Admitting: Family Medicine

## 2015-06-15 DIAGNOSIS — R911 Solitary pulmonary nodule: Secondary | ICD-10-CM

## 2015-06-17 ENCOUNTER — Ambulatory Visit (HOSPITAL_COMMUNITY): Payer: 59

## 2015-07-13 ENCOUNTER — Other Ambulatory Visit: Payer: Self-pay

## 2015-07-13 MED ORDER — PANTOPRAZOLE SODIUM 40 MG PO TBEC: DELAYED_RELEASE_TABLET | ORAL | Status: DC

## 2015-07-26 ENCOUNTER — Encounter: Payer: Self-pay | Admitting: Family Medicine

## 2015-08-29 ENCOUNTER — Ambulatory Visit (HOSPITAL_COMMUNITY)
Admission: RE | Admit: 2015-08-29 | Discharge: 2015-08-29 | Disposition: A | Discharge status: Home / Self Care | Payer: 59 | Source: Ambulatory Visit | Attending: Family Medicine | Admitting: Family Medicine

## 2015-08-29 DIAGNOSIS — I2510 Atherosclerotic heart disease of native coronary artery without angina pectoris: Secondary | ICD-10-CM | POA: Insufficient documentation

## 2015-08-29 DIAGNOSIS — R911 Solitary pulmonary nodule: Secondary | ICD-10-CM | POA: Diagnosis present

## 2015-08-29 IMAGING — CT CT CHEST W/O CM
2 of 3 series · 15 of 36 positions shown, 18 images · non-contrast
Comparison: [DATE]

CLINICAL DATA: Pulmonary nodule

EXAM:
CT CHEST WITHOUT CONTRAST
TECHNIQUE: Multidetector CT imaging of the chest was performed following the
standard protocol without IV contrast.

[Series 2: chestroutine 5.0 b40f · axial · 0.70mm/px · z∈[-346,-71]mm · 12 of 65 slices shown, 15 images]
[im 5/65  mediastinal]
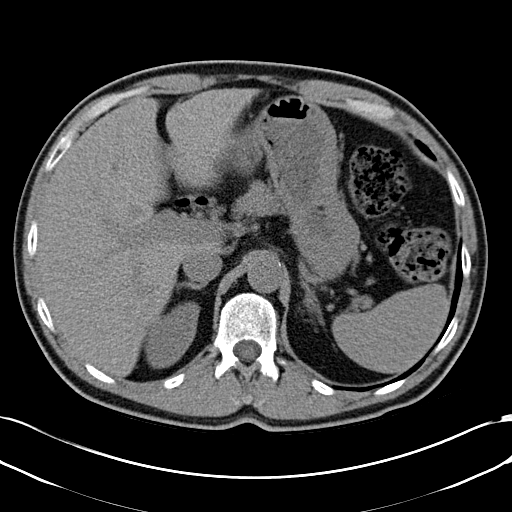
[im 5/65  lung]
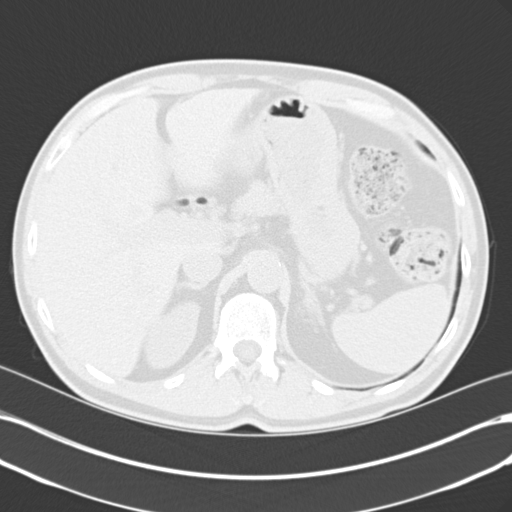
[im 10/65  lung]
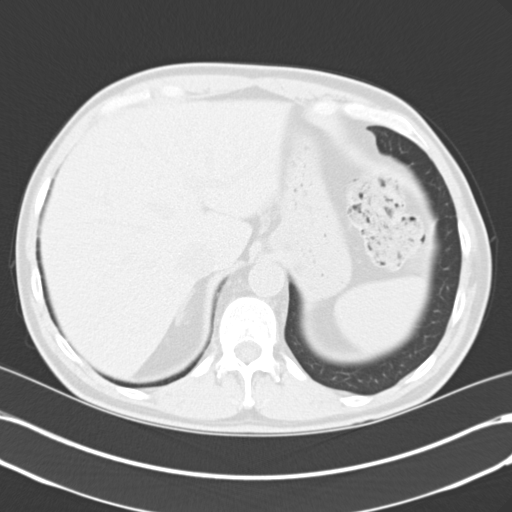
[im 15/65  lung]
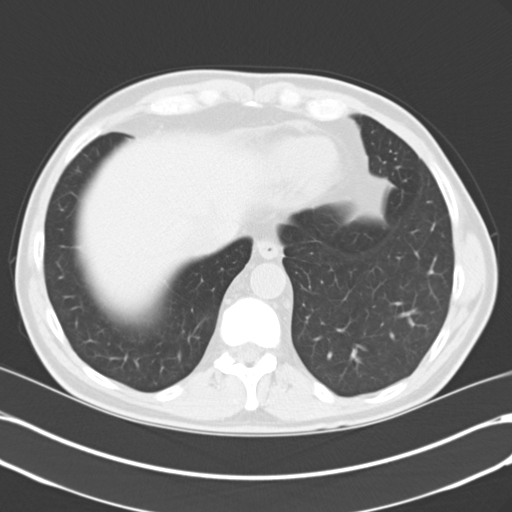
[im 19/65  lung]
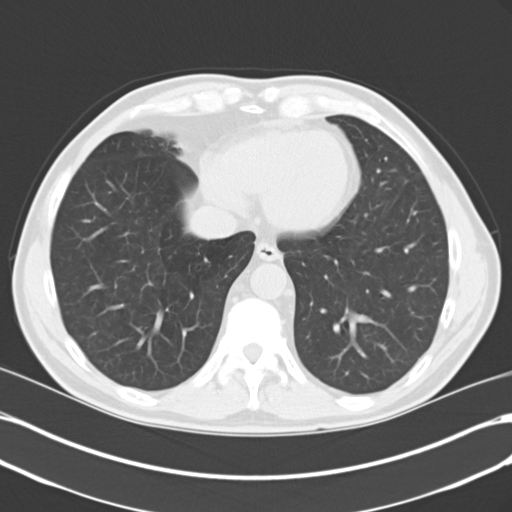
[im 24/65  mediastinal]
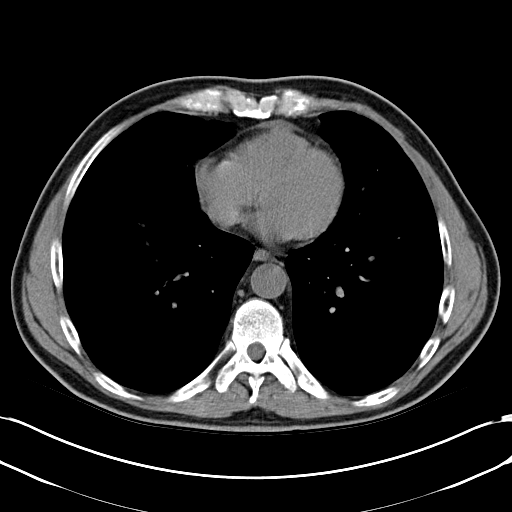
[im 24/65  lung]
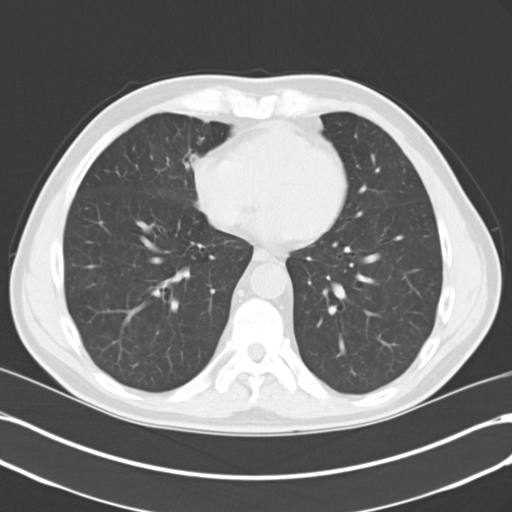
[im 29/65  lung]
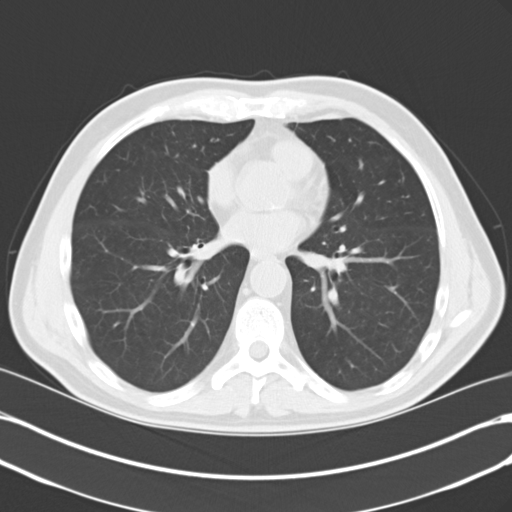
[im 36/65  lung]
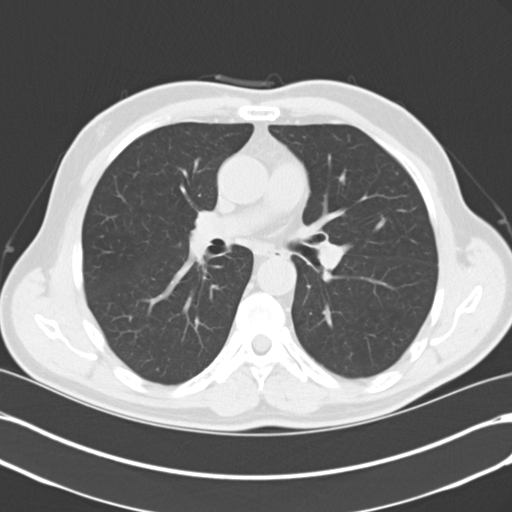
[im 41/65  lung]
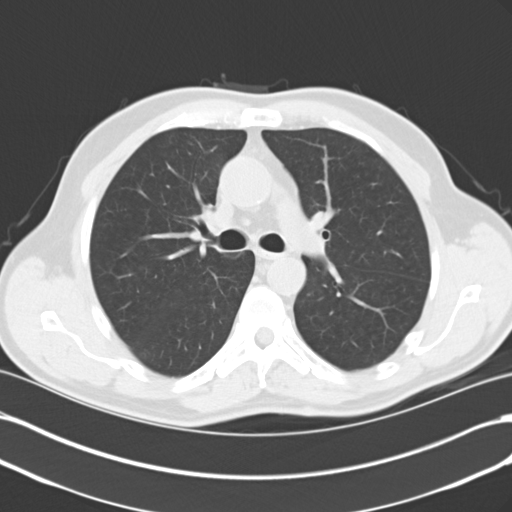
[im 46/65  mediastinal]
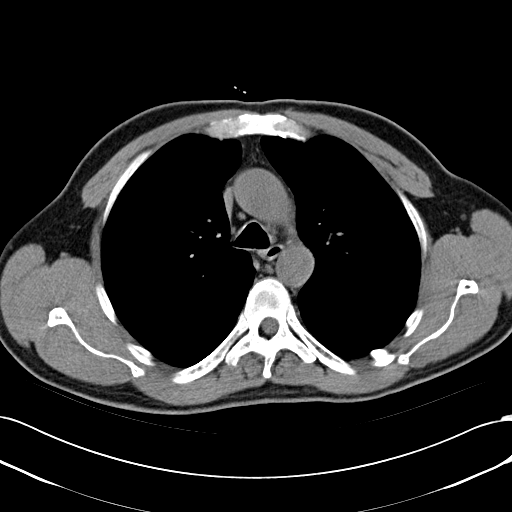
[im 46/65  lung]
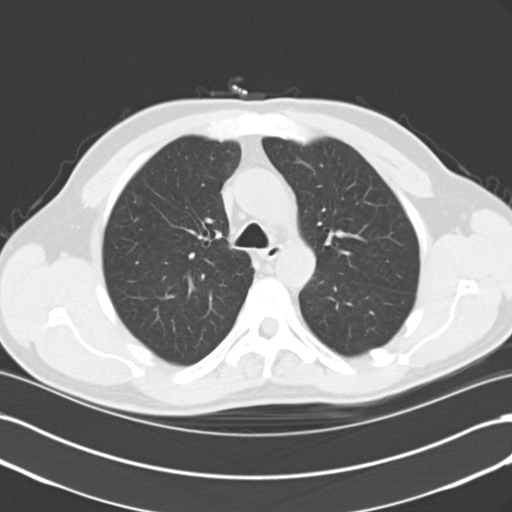
[im 50/65  lung]
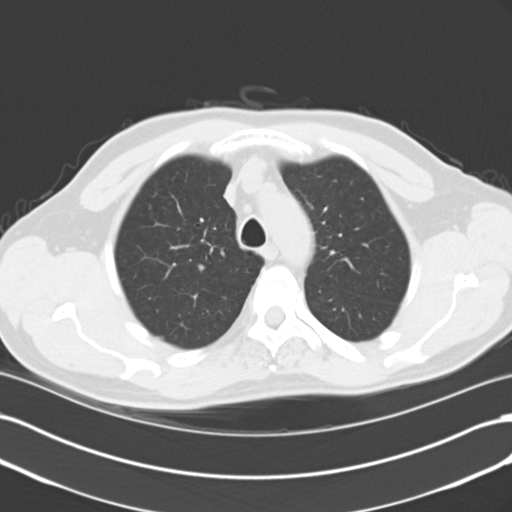
[im 55/65  lung]
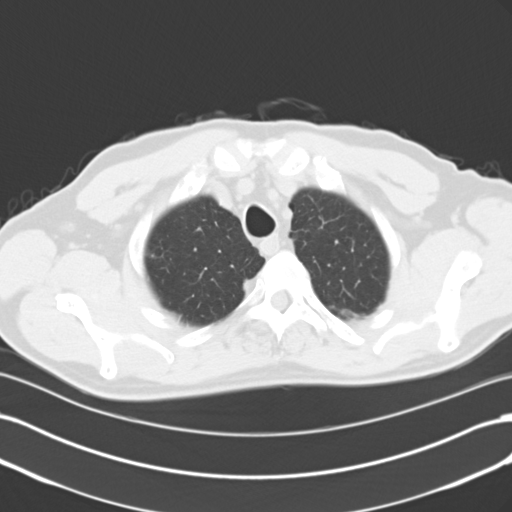
[im 60/65  lung]
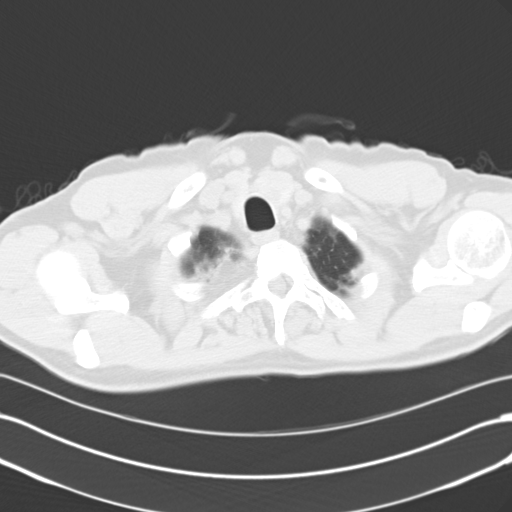

[Series 4: mpr coro 3mm · coronal · 0.64mm/px · 3 of 77 slices shown]
[im 16/77  lung]
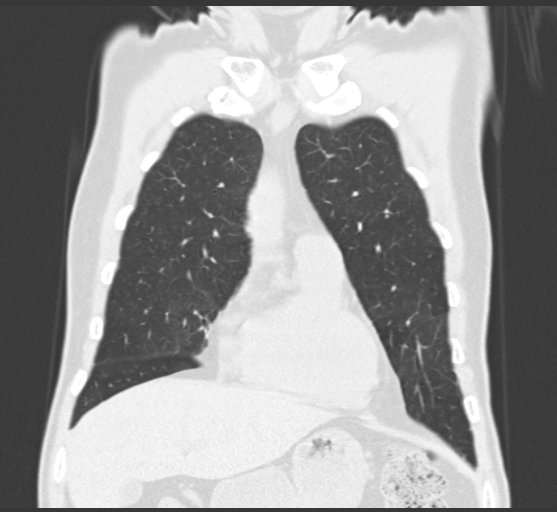
[im 31/77  lung]
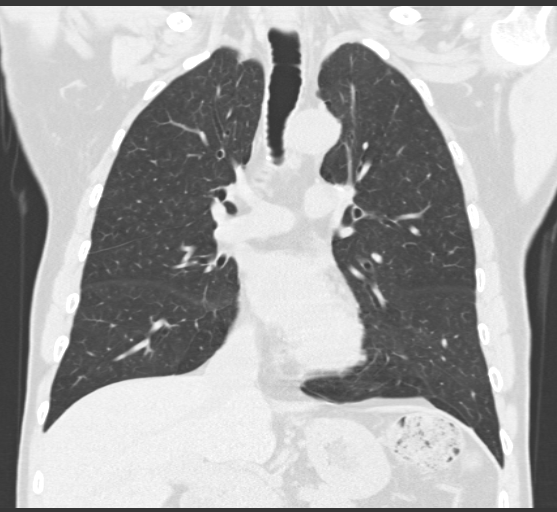
[im 46/77  lung]
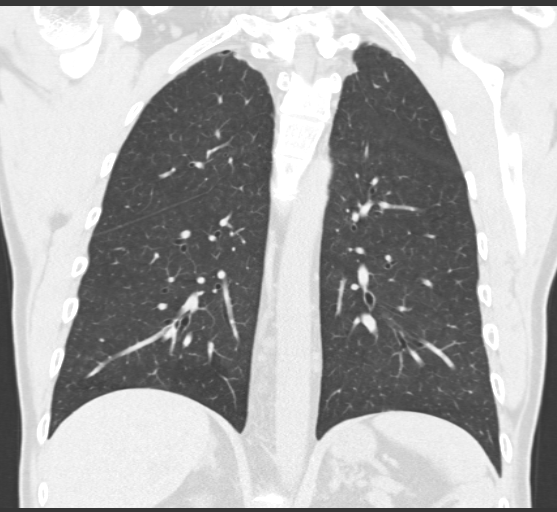

[15 of 36 positions shown; findings below may reference images not displayed]

FINDINGS: Bilateral apical scarring is stable. On axial slice 19 series 3,
there is a stable 6 mm ground-glass type nodular opacity in the
anterior segment of the right upper lobe. On axial slice 29 series
3, there is a stable 4 mm nodular opacity in the anterior segment of
the right upper lobe. On axial slice 33 series 3, there is a stable
4 mm nodular opacity in the posterior segment left upper lobe. On
axial slice 39 series 3, there is a stable 3 mm nodular opacity in
the superior segment of the left lower lobe. There is stable
scarring in the medial right middle lobe. No new parenchymal lung
opacities are appreciated. There is no edema or consolidation.

There is a stable 9 mm nodular opacity in the left lobe of the
thyroid. Thyroid overall appears somewhat inhomogeneous but stable.

There is no appreciable thoracic adenopathy. Minimal thickening of
the pericardium anteriorly is stable.

There is no thoracic aortic aneurysm on this noncontrast enhanced
study. The visualized great vessels appear unremarkable. There are
scattered foci of coronary artery calcification.

In the visualized upper abdomen, no lesion is identified.

There are no blastic or lytic bone lesions.
IMPRESSION: Stable nodular appearing opacities compared to prior study. No new
parenchymal lung lesions are identified. No adenopathy. Slight
anterior pericardial thickening is stable. Somewhat inhomogeneous
appearance of the thyroid is stable. There are scattered foci of
coronary artery calcification.

## 2015-08-31 ENCOUNTER — Encounter: Payer: Self-pay | Admitting: Family Medicine

## 2015-08-31 ENCOUNTER — Ambulatory Visit (INDEPENDENT_AMBULATORY_CARE_PROVIDER_SITE_OTHER): Payer: 59 | Admitting: Family Medicine

## 2015-08-31 VITALS — BP 136/92 | HR 100 | Temp 97.8°F | Ht 72.0 in | Wt 158.0 lb

## 2015-08-31 DIAGNOSIS — K219 Gastro-esophageal reflux disease without esophagitis: Secondary | ICD-10-CM

## 2015-08-31 DIAGNOSIS — Z0000 Encounter for general adult medical examination without abnormal findings: Secondary | ICD-10-CM

## 2015-08-31 DIAGNOSIS — E559 Vitamin D deficiency, unspecified: Secondary | ICD-10-CM

## 2015-08-31 DIAGNOSIS — N400 Enlarged prostate without lower urinary tract symptoms: Secondary | ICD-10-CM

## 2015-08-31 DIAGNOSIS — E119 Type 2 diabetes mellitus without complications: Secondary | ICD-10-CM | POA: Diagnosis not present

## 2015-08-31 DIAGNOSIS — I10 Essential (primary) hypertension: Secondary | ICD-10-CM

## 2015-08-31 DIAGNOSIS — E785 Hyperlipidemia, unspecified: Secondary | ICD-10-CM

## 2015-08-31 LAB — POCT GLYCOSYLATED HEMOGLOBIN (HGB A1C): HEMOGLOBIN A1C: 5.9

## 2015-08-31 MED ORDER — CRESTOR 20 MG PO TABS: 20.0000 mg | ORAL_TABLET | Freq: Every day | ORAL | Status: DC

## 2015-08-31 NOTE — Patient Instructions (Addendum)
Medicare Annual Wellness Visit  [facility] and the medical providers at [facility] strive to bring you the best medical care.  In doing so we not only want to address your current medical conditions and concerns but also to detect new conditions early and prevent illness, disease and health-related problems.    Medicare offers a yearly Wellness Visit which allows our clinical staff to assess your need for preventative services including immunizations, lifestyle education, counseling to decrease risk of preventable diseases and screening for fall risk and other medical concerns.    This visit is provided free of charge (no copay) for all Medicare recipients. The clinical pharmacists at [facility] have begun to conduct these Wellness Visits which will also include a thorough review of all your medications.    As you primary medical provider recommend that you make an appointment for your Annual Wellness Visit if you have not done so already this year.  You may set up this appointment before you leave today or you may call back ([phone]) and schedule an appointment.  Please make sure when you call that you mention that you are scheduling your Annual Wellness Visit with the clinical pharmacist so that the appointment may be made for the proper length of time.     Continue current medications. Continue good therapeutic lifestyle changes which include good diet and exercise. Fall precautions discussed with patient. If an FOBT was given today- please return it to our front desk. If you are over 53 years old - you may need Prevnar 69 or the adult Pneumonia vaccine.  **Flu shots are available--- please call and schedule a FLU-CLINIC appointment**  After your visit with [place] today you will receive a survey in the mail or online from [employer] regarding your care with [place]. Please take a moment to fill this out. Your feedback is very  important to [place] as you can help [place] better understand your patient needs as well as improve your experience and satisfaction. WE CARE ABOUT YOU!!!   Continue to monitor blood pressures and blood sugars at home Stay active physically Try to stop smoking This winter drink plenty of fluids and stay well hydrated We will call you with your lab work results as soon as they become available If the shoulder pain continues return to clinic and we will be glad to inject with a shot of cortisone to the area that is point tender. In the meantime use warm wet compresses and avoid heavy lifting and pushing and pulling

## 2015-08-31 NOTE — Progress Notes (Signed)
Subjective:    Patient ID: Bradley Goodman, male    DOB: 07/24/1956, 59 y.o.   MRN: [id number]  HPI Patient is here today for annual wellness exam and follow up of chronic medical problems which includes hyperlipidemia, hypertension, and diabetes. He is taking medications regularly. The patient is doing well overall. He does have occasional problems with indigestion but as long as he is taking his Nexium this is fine. His wife is a Marine scientist and she checks his blood pressures regularly at home and they run generally in the 130s over the low 80 range and sometimes even as low as 105/76. I checked it again today here and it was 136/92 and the right arm supine. He says it is always up when he comes to this office. He denies chest pain or shortness of breath trouble swallowing nausea vomiting diarrhea or blood in the stool. He's passing his water without problems and there is no problems with his sex life. His blood sugars at home and they're running fasting in the 80s. The average blood sugars at home are 116/78.      Patient Active Problem List   Diagnosis Date Noted  . GERD (gastroesophageal reflux disease) 06/24/2013  . Diabetes mellitus type 2 controlled 06/24/2013  . Hyperlipidemia 06/24/2013  . Hypertension 06/24/2013   Outpatient Encounter Prescriptions as of 08/31/2015  Medication Sig  . aspirin EC 81 MG tablet Take 81 mg by mouth every evening.  . cholecalciferol (VITAMIN D) 1000 UNITS tablet Take 2,000 Units by mouth every evening. Pt takes two tablets daily  . CRESTOR 20 MG tablet TAKE 1 TABLET BY MOUTH DAILY  . Cyanocobalamin (B-12) 5000 MCG SUBL Place 1 tablet under the tongue every evening.  Javier Docker Oil 300 MG CAPS Take 1 capsule by mouth every evening.  . loratadine (CLARITIN) 10 MG tablet Take 10 mg by mouth every morning.  Marland Kitchen losartan (COZAAR) 50 MG tablet Take 1 tablet (50 mg total) by mouth daily.  . Multiple Vitamin (MULTIVITAMIN) tablet Take 1 tablet by mouth every evening.    . pantoprazole (PROTONIX) 40 MG tablet TAKE 1 TABLET BY MOUTH DAILY   No facility-administered encounter medications on file as of 08/31/2015.      Review of Systems  Constitutional: Negative.   HENT: Negative.   Eyes: Negative.   Respiratory: Negative.   Cardiovascular: Negative.   Gastrointestinal: Negative.   Endocrine: Negative.   Genitourinary: Negative.   Musculoskeletal: Positive for arthralgias (left shoulder pain).  Skin: Negative.   Allergic/Immunologic: Negative.   Neurological: Negative.   Hematological: Negative.   Psychiatric/Behavioral: Negative.        Objective:   Physical Exam  Constitutional: He is oriented to person, place, and time. He appears well-developed and well-nourished. No distress.  HENT:  Head: Normocephalic and atraumatic.  Right Ear: External ear normal.  Left Ear: External ear normal.  Mouth/Throat: Oropharynx is clear and moist. No oropharyngeal exudate.  Slight nasal congestion  Eyes: Conjunctivae and EOM are normal. Pupils are equal, round, and reactive to light. Right eye exhibits no discharge. Left eye exhibits no discharge. No scleral icterus.  Neck: Normal range of motion. Neck supple. No thyromegaly present.  Neck without bruits thyromegaly  Cardiovascular: Normal rate, regular rhythm, normal heart sounds and intact distal pulses.   No murmur heard. The rhythm is regular at 84/m  Pulmonary/Chest: Effort normal and breath sounds normal. No respiratory distress. He has no wheezes. He has no rales. He exhibits no tenderness.  No axillary adenopathy and chest is clear anteriorly and posteriorly  Abdominal: Soft. Bowel sounds are normal. He exhibits distension. He exhibits no mass. There is no tenderness. There is no rebound and no guarding.  The patient has a lot of gaseous distention. There is no liver or spleen enlargement and there are no inguinal nodes. The bowel sounds were normal and there were no bruits.  Genitourinary: Rectum  normal and penis normal.  The prostate is slightly enlarged but soft and smooth. There are no rectal masses. There were no inguinal hernias or inguinal nodes and the external genitalia were within normal limits.  Musculoskeletal: Normal range of motion. He exhibits no edema or tenderness.  Left shoulder had good range of motion and there was no trigger point of tenderness.  Lymphadenopathy:    He has no cervical adenopathy.  Neurological: He is alert and oriented to person, place, and time. He has normal reflexes. No cranial nerve deficit.  Skin: Skin is warm and dry. No rash noted.  Psychiatric: He has a normal mood and affect. His behavior is normal. Judgment and thought content normal.  Nursing note and vitals reviewed.   BP 151/102 mmHg  Pulse 100  Temp(Src) 97.8 F (36.6 C) (Oral)  Ht 6' (1.829 m)  Wt 158 lb (71.668 kg)  BMI 21.42 kg/m2  Repeat blood pressure right arm supine regular cuff 136/92     Assessment & Plan:  1. Type 2 diabetes mellitus without complication, without long-term current use of insulin ([place]) -The patient is currently controlling his blood sugar with diet. He should continue with this aggressively and continue to monitor his blood sugars closely at home. - POCT glycosylated hemoglobin (Hb A1C) - BMP8+EGFR - CBC with Differential/Platelet - Microalbumin / creatinine urine ratio  2. Hyperlipidemia -Continue current treatment pending results of lab work - CBC with Differential/Platelet - NMR, lipoprofile  3. Essential hypertension -The blood pressure has always been elevated in this office. His wife who is a nurse checks his blood pressures at home and they average 116/78 and sometimes lower. Rechecking the blood pressure here guide it at 136/92. We will not change his treatment as results of the home readings and the lower reading that I obtained here after checking it the second time. He will continue to watch his sodium intake - BMP8+EGFR - CBC with  Differential/Platelet - Hepatic function panel  4. Vitamin D deficiency -Continue current treatment pending results of lab work - CBC with Differential/Platelet - VITAMIN D 25 Hydroxy (Vit-D Deficiency, Fractures)  5. Gastroesophageal reflux disease, esophagitis presence not specified -The patient will consider weaning off of the Nexium and trying some Zantac over the next couple months and the Zantac of course would have to be taken twice daily before breakfast and supper versus the Nexium once daily. He also has plans to see the gastroenterologist and that will be in December. - CBC with Differential/Platelet - Hepatic function panel  6. Annual physical exam -Check with insurance regarding the Prevnar vaccine - POCT glycosylated hemoglobin (Hb A1C) - BMP8+EGFR - CBC with Differential/Platelet - Hepatic function panel - NMR, lipoprofile - VITAMIN D 25 Hydroxy (Vit-D Deficiency, Fractures) - POCT urinalysis dipstick - POCT UA - Microscopic Only - PSA, total and free  7. BPH -The prostate is only slightly enlarged and the patient is having no symptoms with passing his water.  Meds ordered this encounter  Medications  . CRESTOR 20 MG tablet    Sig: Take 1 tablet (20 mg  total) by mouth daily.    Dispense:  90 tablet    Refill:  3   Patient Instructions                       Medicare Annual Wellness Visit  [facility] and the medical providers at [facility] strive to bring you the best medical care.  In doing so we not only want to address your current medical conditions and concerns but also to detect new conditions early and prevent illness, disease and health-related problems.    Medicare offers a yearly Wellness Visit which allows our clinical staff to assess your need for preventative services including immunizations, lifestyle education, counseling to decrease risk of preventable diseases and screening for fall risk and other medical concerns.     This visit is provided free of charge (no copay) for all Medicare recipients. The clinical pharmacists at [facility] have begun to conduct these Wellness Visits which will also include a thorough review of all your medications.    As you primary medical provider recommend that you make an appointment for your Annual Wellness Visit if you have not done so already this year.  You may set up this appointment before you leave today or you may call back ([phone]) and schedule an appointment.  Please make sure when you call that you mention that you are scheduling your Annual Wellness Visit with the clinical pharmacist so that the appointment may be made for the proper length of time.     Continue current medications. Continue good therapeutic lifestyle changes which include good diet and exercise. Fall precautions discussed with patient. If an FOBT was given today- please return it to our front desk. If you are over 58 years old - you may need Prevnar 38 or the adult Pneumonia vaccine.  **Flu shots are available--- please call and schedule a FLU-CLINIC appointment**  After your visit with [place] today you will receive a survey in the mail or online from [employer] regarding your care with [place]. Please take a moment to fill this out. Your feedback is very important to [place] as you can help [place] better understand your patient needs as well as improve your experience and satisfaction. WE CARE ABOUT YOU!!!   Continue to monitor blood pressures and blood sugars at home Stay active physically Try to stop smoking This winter drink plenty of fluids and stay well hydrated We will call you with your lab work results as soon as they become available If the shoulder pain continues return to clinic and we will be glad to inject with a shot of cortisone to the area that is point tender. In the meantime use warm wet compresses and avoid heavy lifting and pushing and pulling    Arrie Senate  MD

## 2015-09-01 LAB — CBC WITH DIFFERENTIAL/PLATELET
BASOS ABS: 0 10*3/uL (ref 0.0–0.2)
Basos: 1 %
EOS (ABSOLUTE): 0.1 10*3/uL (ref 0.0–0.4)
Eos: 1 %
Hematocrit: 49.2 % (ref 37.5–51.0)
Hemoglobin: 16.1 g/dL (ref 12.6–17.7)
Immature Grans (Abs): 0 10*3/uL (ref 0.0–0.1)
Immature Granulocytes: 0 %
Lymphocytes Absolute: 1.8 10*3/uL (ref 0.7–3.1)
Lymphs: 24 %
MCH: 29.1 pg (ref 26.6–33.0)
MCHC: 32.7 g/dL (ref 31.5–35.7)
MCV: 89 fL (ref 79–97)
Monocytes Absolute: 0.5 10*3/uL (ref 0.1–0.9)
Monocytes: 7 %
Neutrophils Absolute: 5.2 10*3/uL (ref 1.4–7.0)
Neutrophils: 67 %
PLATELETS: 225 10*3/uL (ref 150–379)
RBC: 5.54 x10E6/uL (ref 4.14–5.80)
RDW: 13.6 % (ref 12.3–15.4)
WBC: 7.6 10*3/uL (ref 3.4–10.8)

## 2015-09-01 LAB — BMP8+EGFR
BUN/Creatinine Ratio: 13 (ref 9–20)
BUN: 13 mg/dL (ref 6–24)
CHLORIDE: 103 mmol/L (ref 97–106)
CO2: 25 mmol/L (ref 18–29)
Calcium: 9.5 mg/dL (ref 8.7–10.2)
Creatinine, Ser: 1.02 mg/dL (ref 0.76–1.27)
GFR calc Af Amer: 93 mL/min/{1.73_m2} (ref >59)
GFR calc non Af Amer: 81 mL/min/{1.73_m2} (ref >59)
Glucose: 104 mg/dL — ABNORMAL HIGH (ref 65–99)
POTASSIUM: 5.2 mmol/L (ref 3.5–5.2)
SODIUM: 142 mmol/L (ref 136–144)

## 2015-09-01 LAB — NMR, LIPOPROFILE
Cholesterol: 99 mg/dL — ABNORMAL LOW (ref 100–199)
HDL CHOLESTEROL BY NMR: 35 mg/dL — AB (ref >39)
HDL Particle Number: 34.1 umol/L (ref >=30.5)
LDL PARTICLE NUMBER: 740 nmol/L (ref <1000)
LDL SIZE: 19.7 nm (ref >20.5)
LDL-C: 40 mg/dL (ref 0–99)
LP-IR Score: 64 — ABNORMAL HIGH (ref <=45)
Small LDL Particle Number: 587 nmol/L — ABNORMAL HIGH (ref <=527)
TRIGLYCERIDES BY NMR: 120 mg/dL (ref 0–149)

## 2015-09-01 LAB — HEPATIC FUNCTION PANEL
ALT: 17 IU/L (ref 0–44)
AST: 16 IU/L (ref 0–40)
Albumin: 4.4 g/dL (ref 3.5–5.5)
Alkaline Phosphatase: 69 IU/L (ref 39–117)
Bilirubin Total: 0.3 mg/dL (ref 0.0–1.2)
Bilirubin, Direct: 0.11 mg/dL (ref 0.00–0.40)
TOTAL PROTEIN: 6.7 g/dL (ref 6.0–8.5)

## 2015-09-01 LAB — PSA, TOTAL AND FREE
PROSTATE SPECIFIC AG, SERUM: 2 ng/mL (ref 0.0–4.0)
PSA FREE PCT: 19 %
PSA FREE: 0.38 ng/mL

## 2015-09-01 LAB — VITAMIN D 25 HYDROXY (VIT D DEFICIENCY, FRACTURES): Vit D, 25-Hydroxy: 70 ng/mL (ref 30.0–100.0)

## 2015-09-07 ENCOUNTER — Ambulatory Visit: Payer: 59 | Admitting: Nurse Practitioner

## 2015-09-13 ENCOUNTER — Ambulatory Visit: Payer: 59 | Admitting: Nurse Practitioner

## 2015-09-14 ENCOUNTER — Other Ambulatory Visit: Payer: Self-pay

## 2015-09-14 ENCOUNTER — Ambulatory Visit (INDEPENDENT_AMBULATORY_CARE_PROVIDER_SITE_OTHER): Payer: 59 | Admitting: Nurse Practitioner

## 2015-09-14 ENCOUNTER — Encounter: Payer: Self-pay | Admitting: Nurse Practitioner

## 2015-09-14 VITALS — BP 138/100 | HR 99 | Temp 97.4°F | Ht 71.0 in | Wt 156.0 lb

## 2015-09-14 DIAGNOSIS — K219 Gastro-esophageal reflux disease without esophagitis: Secondary | ICD-10-CM | POA: Diagnosis not present

## 2015-09-14 DIAGNOSIS — Z86010 Personal history of colonic polyps: Secondary | ICD-10-CM | POA: Diagnosis not present

## 2015-09-14 MED ORDER — PEG-KCL-NACL-NASULF-NA ASC-C 100 G PO SOLR: 1.0000 | ORAL | Status: DC

## 2015-09-14 NOTE — Assessment & Plan Note (Signed)
Asian with a history of multiple colon adenomas on last colonoscopy 3 years ago. The largest of these was 2 cm and on a stalk. Required clips post polypectomy due to bleeding because of the size of polyp. Currently asymptomatic from a GI standpoint. We'll proceed with colonoscopy for surveillance purposes.  Proceed with TCS with Dr. Gala Romney in near future: the risks, benefits, and alternatives have been discussed with the patient in detail. The patient states understanding and desires to proceed.  Patient is not on any anticoagulants, anxiolytics, chronic pain medications, or antidepressants. Conscious sedation at last colonoscopy with a good result no complications. Conscious sedation should again be adequate for his procedure.

## 2015-09-14 NOTE — Patient Instructions (Signed)
1. We will schedule your procedure for you. 2. Return for follow-up as needed. 3. Further recommendations to be based on results of your procedure.

## 2015-09-14 NOTE — Progress Notes (Signed)
CC'ED TO PCP 

## 2015-09-14 NOTE — Progress Notes (Signed)
Referring Provider: Chipper Herb, MD Primary Care Physician:  Redge Gainer, MD Primary GI:  Dr. Gala Romney  Chief Complaint  Patient presents with  . Follow-up    HPI:   59 year old male presents for follow-up on previous colonoscopy and to schedule repeat surveillance colonoscopy. Last colonoscopy completed 01/23/2012 which found adequate prep, multiple colon polyps which were removed the largest being in nearly 2 cm polyp in the sigmoid on a long stalk. Pathology found all polyps to be tubular adenoma and recommended 3 year repeat surveillance colonoscopy.  Today he states he's doing well. Denies abdominal pain, N/V, hematochezia, melena, fever, chills, unintentional weight loss. GERD symptoms well controlled on current PPI. Denies dysphagia.Denies chest pain, dyspnea, dizziness, lightheadedness, syncope, near syncope. Denies any other upper or lower GI symptoms.  Past Medical History  Diagnosis Date  . Hypercholesteremia   . Headache(784.0)     history of migraines  . GERD (gastroesophageal reflux disease)   . Hypertension     Past Surgical History  Procedure Laterality Date  . Bilateral inguinal hernia repair    . Colonoscopy  01/23/2012    Procedure: COLONOSCOPY;  Surgeon: Daneil Dolin, MD;  Location: [facility];  Service: Endoscopy;  Laterality: N/A;  9:30 AM    Current Outpatient Prescriptions  Medication Sig Dispense Refill  . aspirin EC 81 MG tablet Take 81 mg by mouth every evening.    . cholecalciferol (VITAMIN D) 1000 UNITS tablet Take 2,000 Units by mouth every evening. Pt takes two tablets daily    . CRESTOR 20 MG tablet Take 1 tablet (20 mg total) by mouth daily. 90 tablet 3  . Cyanocobalamin (B-12) 5000 MCG SUBL Place 1 tablet under the tongue every evening.    Javier Docker Oil 300 MG CAPS Take 1 capsule by mouth every evening.    . loratadine (CLARITIN) 10 MG tablet Take 10 mg by mouth every morning.    . Multiple Vitamin (MULTIVITAMIN) tablet Take 1 tablet  by mouth every evening.     . pantoprazole (PROTONIX) 40 MG tablet TAKE 1 TABLET BY MOUTH DAILY 90 tablet 0  . losartan (COZAAR) 50 MG tablet Take 1 tablet (50 mg total) by mouth daily. (Patient not taking: Reported on 09/14/2015) 90 tablet 3   No current facility-administered medications for this visit.    Allergies as of 09/14/2015  . (No Known Allergies)    Family History  Problem Relation Age of Onset  . Colon cancer Neg Hx   . Heart attack Mother   . Heart attack Father     Social History   Social History  . Marital Status: Married    Spouse Name: N/A  . Number of Children: N/A  . Years of Education: N/A   Social History Main Topics  . Smoking status: Current Every Day Smoker -- 1.00 packs/day for 30 years    Types: Cigarettes  . Smokeless tobacco: None  . Alcohol Use: No  . Drug Use: No  . Sexual Activity: Not Asked   Other Topics Concern  . None   Social History Narrative    Review of Systems: General: Negative for anorexia, weight loss, fever, chills, fatigue, weakness. ENT: Negative for hoarseness, difficulty swallowing. CV: Negative for chest pain, angina, palpitations, peripheral edema.  Respiratory: Negative for dyspnea at rest, cough, sputum, wheezing.  GI: See history of present illness. Endo: Negative for unusual weight change.  Heme: Negative for bruising or bleeding.   Physical Exam: BP 138/100  mmHg  Pulse 99  Temp(Src) 97.4 F (36.3 C)  Ht '5\' 11"'$  (1.803 m)  Wt 156 lb (70.761 kg)  BMI 21.77 kg/m2 General:   Alert and oriented. Pleasant and cooperative. Well-nourished and well-developed.  Head:  Normocephalic and atraumatic. Eyes:  Without icterus, sclera clear and conjunctiva pink.  Ears:  Normal auditory acuity. Cardiovascular:  S1, S2 present without murmurs appreciated. Extremities without clubbing or edema. Respiratory:  Clear to auscultation bilaterally. No wheezes, rales, or rhonchi. No distress.  Gastrointestinal:  +BS, soft,  non-tender and non-distended. No HSM noted. No guarding or rebound. No masses appreciated.  Rectal:  Deferred  Neurologic:  Alert and oriented x4;  grossly normal neurologically. Psych:  Alert and cooperative. Normal mood and affect.    09/14/2015 8:11 AM

## 2015-09-14 NOTE — Assessment & Plan Note (Signed)
Her symptoms very well controlled on Protonix. Denies reports of breakthrough symptoms as long as he takes medication. Continue taking PPI, return for follow-up as needed.

## 2015-10-11 ENCOUNTER — Encounter (HOSPITAL_COMMUNITY): Payer: Self-pay | Admitting: *Deleted

## 2015-10-11 ENCOUNTER — Encounter (HOSPITAL_COMMUNITY)
Admission: RE | Disposition: A | Discharge status: Home / Self Care | Payer: Self-pay | Source: Ambulatory Visit | Attending: Internal Medicine

## 2015-10-11 ENCOUNTER — Ambulatory Visit (HOSPITAL_COMMUNITY)
Admission: RE | Admit: 2015-10-11 | Discharge: 2015-10-11 | Disposition: A | Discharge status: Home / Self Care | Payer: 59 | Source: Ambulatory Visit | Attending: Internal Medicine | Admitting: Internal Medicine

## 2015-10-11 DIAGNOSIS — D123 Benign neoplasm of transverse colon: Secondary | ICD-10-CM | POA: Diagnosis not present

## 2015-10-11 DIAGNOSIS — Z7982 Long term (current) use of aspirin: Secondary | ICD-10-CM | POA: Insufficient documentation

## 2015-10-11 DIAGNOSIS — F17210 Nicotine dependence, cigarettes, uncomplicated: Secondary | ICD-10-CM | POA: Diagnosis not present

## 2015-10-11 DIAGNOSIS — Z1211 Encounter for screening for malignant neoplasm of colon: Secondary | ICD-10-CM | POA: Insufficient documentation

## 2015-10-11 DIAGNOSIS — Z79899 Other long term (current) drug therapy: Secondary | ICD-10-CM | POA: Insufficient documentation

## 2015-10-11 DIAGNOSIS — Z860100 Personal history of colon polyps, unspecified: Secondary | ICD-10-CM | POA: Insufficient documentation

## 2015-10-11 DIAGNOSIS — I10 Essential (primary) hypertension: Secondary | ICD-10-CM | POA: Diagnosis not present

## 2015-10-11 DIAGNOSIS — D125 Benign neoplasm of sigmoid colon: Secondary | ICD-10-CM

## 2015-10-11 DIAGNOSIS — K219 Gastro-esophageal reflux disease without esophagitis: Secondary | ICD-10-CM | POA: Diagnosis not present

## 2015-10-11 DIAGNOSIS — E7800 Pure hypercholesterolemia, unspecified: Secondary | ICD-10-CM | POA: Diagnosis not present

## 2015-10-11 HISTORY — PX: COLONOSCOPY: SHX5424

## 2015-10-11 HISTORY — DX: Other specified postprocedural states: Z98.890

## 2015-10-11 HISTORY — DX: Nausea with vomiting, unspecified: R11.2

## 2015-10-11 SURGERY — COLONOSCOPY
Anesthesia: Moderate Sedation

## 2015-10-11 MED ORDER — SODIUM CHLORIDE 0.9 % IV SOLN
INTRAVENOUS | Status: DC
  Administered 2015-10-11: 08:00:00 via INTRAVENOUS

## 2015-10-11 MED ORDER — MIDAZOLAM HCL 5 MG/5ML IJ SOLN
INTRAMUSCULAR | Status: DC | PRN
  Administered 2015-10-11: 1 mg via INTRAVENOUS
  Administered 2015-10-11 (×2): 2 mg via INTRAVENOUS

## 2015-10-11 MED ORDER — ONDANSETRON HCL 4 MG/2ML IJ SOLN
INTRAMUSCULAR | Status: AC
  Filled 2015-10-11: qty 2

## 2015-10-11 MED ORDER — MIDAZOLAM HCL 5 MG/5ML IJ SOLN
INTRAMUSCULAR | Status: AC
  Filled 2015-10-11: qty 10

## 2015-10-11 MED ORDER — STERILE WATER FOR IRRIGATION IR SOLN
Status: DC | PRN
  Administered 2015-10-11: 09:00:00

## 2015-10-11 MED ORDER — MEPERIDINE HCL 100 MG/ML IJ SOLN
INTRAMUSCULAR | Status: AC
  Filled 2015-10-11: qty 2

## 2015-10-11 MED ORDER — ONDANSETRON HCL 4 MG/2ML IJ SOLN
INTRAMUSCULAR | Status: DC | PRN
  Administered 2015-10-11: 4 mg via INTRAVENOUS

## 2015-10-11 MED ORDER — MEPERIDINE HCL 100 MG/ML IJ SOLN
INTRAMUSCULAR | Status: DC | PRN
  Administered 2015-10-11: 25 mg via INTRAVENOUS
  Administered 2015-10-11: 50 mg via INTRAVENOUS
  Administered 2015-10-11: 25 mg via INTRAVENOUS

## 2015-10-11 NOTE — Op Note (Signed)
[facility] [address] [place], [address]   COLONOSCOPY PROCEDURE REPORT  PATIENT: Bradley Goodman, Bradley Goodman  MR#: [id number] BIRTHDATE: Mar 22, 1956 , 40  yrs. old GENDER: male ENDOSCOPIST: R.  Garfield Cornea, MD FACP [facility] REFERRED YF:VCBSWH Laurance Flatten, M.D. PROCEDURE DATE:  Oct 28, 2015 PROCEDURE:   Colonoscopy with snare polypectomy INDICATIONS:history of multiple colonic adenomas. MEDICATIONS: Versed 5 mg IV and Demerol 100 mg IV in divided doses. Zofran 4 mg IV. ASA CLASS:       Class II  CONSENT: The risks, benefits, alternatives and imponderables including but not limited to bleeding, perforation as well as the possibility of a missed lesion have been reviewed.  The potential for biopsy, lesion removal, etc. have also been discussed. Questions have been answered.  All parties agreeable.  Please see the history and physical in the medical record for more information.  DESCRIPTION OF PROCEDURE:   After the risks benefits and alternatives of the procedure were thoroughly explained, informed consent was obtained.  The digital rectal exam revealed no abnormalities of the rectum.   The [id number] ([id number])  endoscope was introduced through the anus and advanced to the cecum, which was identified by both the appendix and ileocecal valve. No adverse events experienced.   The quality of the prep was adequate  The instrument was then slowly withdrawn as the colon was fully examined. Estimated blood loss is zero unless otherwise noted in this procedure report.      COLON FINDINGS: Minimal internal hemorrhoids and anal papilla; otherwise, normal appearing rectal mucosa.  Tatoodd site of prior left colon polypectomy readily identified.  Mucosa appeared normal. The patient (1) 5 mm polyp in the mid sigmoid segment and (2) 5 mm polyps at the splenic flexure; otherwise, the remainder of the colonic mucosa appeared normal.  Incidentally, no sign of hemostasis clip previously  placed.  The above-mentioned polyps were cold snare removed.  Retroflexion was performed. .  Withdrawal time=14 minutes 0 seconds.  The scope was withdrawn and the procedure completed. COMPLICATIONS: There were no immediate complications. EBL 3 mL ENDOSCOPIC IMPRESSION: Multiple colonic polyps?"removed as described above  RECOMMENDATIONS: Follow-up on pathology.  eSigned:  R. Garfield Cornea, MD Rosalita Chessman [facility] 2015-10-28 9:11 AM   cc:  CPT CODES: ICD CODES:  The ICD and CPT codes recommended by this software are interpretations from the data that the clinical staff has captured with the software.  The verification of the translation of this report to the ICD and CPT codes and modifiers is the sole responsibility of the health care institution and practicing physician where this report was generated.  [employer]. will not be held responsible for the validity of the ICD and CPT codes included on this report.  AMA assumes no liability for data contained or not contained herein. CPT is a Designer, television/film set of the [employer].  PATIENT NAME:  Bradley Goodman, Bradley Goodman MR#: [id number]

## 2015-10-11 NOTE — Interval H&P Note (Signed)
History and Physical Interval Note:  10/11/2015 8:25 AM  Bradley Goodman  has presented today for surgery, with the diagnosis of HISTORY OF POLYPS  The various methods of treatment have been discussed with the patient and family. After consideration of risks, benefits and other options for treatment, the patient has consented to  Procedure(s) with comments: COLONOSCOPY (N/A) - 830 as a surgical intervention .  The patient's history has been reviewed, patient examined, no change in status, stable for surgery.  I have reviewed the patient's chart and labs.  Questions were answered to the patient's satisfaction.     Manus Rudd

## 2015-10-11 NOTE — Interval H&P Note (Signed)
History and Physical Interval Note:  10/11/2015 8:28 AM  Bradley Goodman  has presented today for surgery, with the diagnosis of HISTORY OF POLYPS  The various methods of treatment have been discussed with the patient and family. After consideration of risks, benefits and other options for treatment, the patient has consented to  Procedure(s) with comments: COLONOSCOPY (N/A) - 830 as a surgical intervention .  The patient's history has been reviewed, patient examined, no change in status, stable for surgery.  I have reviewed the patient's chart and labs.  Questions were answered to the patient's satisfaction.     Jennifr Gaeta  No change. Surveillance colonoscopy per plan.  The risks, benefits, limitations, alternatives and imponderables have been reviewed with the patient. Questions have been answered. All parties are agreeable.

## 2015-10-11 NOTE — Discharge Instructions (Signed)
Colonoscopy Discharge Instructions  Read the instructions outlined below and refer to this sheet in the next few weeks. These discharge instructions provide you with general information on caring for yourself after you leave the hospital. Your doctor may also give you specific instructions. While your treatment has been planned according to the most current medical practices available, unavoidable complications occasionally occur. If you have any problems or questions after discharge, call Dr. Gala Romney at [phone]. ACTIVITY  You may resume your regular activity, but move at a slower pace for the next 24 hours.   Take frequent rest periods for the next 24 hours.   Walking will help get rid of the air and reduce the bloated feeling in your belly (abdomen).   No driving for 24 hours (because of the medicine (anesthesia) used during the test).    Do not sign any important legal documents or operate any machinery for 24 hours (because of the anesthesia used during the test).  NUTRITION  Drink plenty of fluids.   You may resume your normal diet as instructed by your doctor.   Begin with a light meal and progress to your normal diet. Heavy or fried foods are harder to digest and may make you feel sick to your stomach (nauseated).   Avoid alcoholic beverages for 24 hours or as instructed.  MEDICATIONS  You may resume your normal medications unless your doctor tells you otherwise.  WHAT YOU CAN EXPECT TODAY  Some feelings of bloating in the abdomen.   Passage of more gas than usual.   Spotting of blood in your stool or on the toilet paper.  IF YOU HAD POLYPS REMOVED DURING THE COLONOSCOPY:  No aspirin products for 7 days or as instructed.   No alcohol for 7 days or as instructed.   Eat a soft diet for the next 24 hours.  FINDING OUT THE RESULTS OF YOUR TEST Not all test results are available during your visit. If your test results are not back during the visit, make an appointment  with your caregiver to find out the results. Do not assume everything is normal if you have not heard from your caregiver or the medical facility. It is important for you to follow up on all of your test results.  SEEK IMMEDIATE MEDICAL ATTENTION IF:  You have more than a spotting of blood in your stool.   Your belly is swollen (abdominal distention).   You are nauseated or vomiting.   You have a temperature over 101.   You have abdominal pain or discomfort that is severe or gets worse throughout the day.    Polyp Information provided  Further recommendations to follow pending review of pathology report     Colon Polyps Polyps are lumps of extra tissue growing inside the body. Polyps can grow in the large intestine (colon). Most colon polyps are noncancerous (benign). However, some colon polyps can become cancerous over time. Polyps that are larger than a pea may be harmful. To be safe, caregivers remove and test all polyps. CAUSES  Polyps form when mutations in the genes cause your cells to grow and divide even though no more tissue is needed. RISK FACTORS There are a number of risk factors that can increase your chances of getting colon polyps. They include:  Being older than 50 years.  Family history of colon polyps or colon cancer.  Long-term colon diseases, such as colitis or Crohn disease.  Being overweight.  Smoking.  Being inactive.  Drinking too  much alcohol. SYMPTOMS  Most small polyps do not cause symptoms. If symptoms are present, they may include:  Blood in the stool. The stool may look dark red or black.  Constipation or diarrhea that lasts longer than 1 week. DIAGNOSIS People often do not know they have polyps until their caregiver finds them during a regular checkup. Your caregiver can use 4 tests to check for polyps:  Digital rectal exam. The caregiver wears gloves and feels inside the rectum. This test would find polyps only in the rectum.  Barium  enema. The caregiver puts a liquid called barium into your rectum before taking X-rays of your colon. Barium makes your colon look white. Polyps are dark, so they are easy to see in the X-ray pictures.  Sigmoidoscopy. A thin, flexible tube (sigmoidoscope) is placed into your rectum. The sigmoidoscope has a light and tiny camera in it. The caregiver uses the sigmoidoscope to look at the last third of your colon.  Colonoscopy. This test is like sigmoidoscopy, but the caregiver looks at the entire colon. This is the most common method for finding and removing polyps. TREATMENT  Any polyps will be removed during a sigmoidoscopy or colonoscopy. The polyps are then tested for cancer. PREVENTION  To help lower your risk of getting more colon polyps:  Eat plenty of fruits and vegetables. Avoid eating fatty foods.  Do not smoke.  Avoid drinking alcohol.  Exercise every day.  Lose weight if recommended by your caregiver.  Eat plenty of calcium and folate. Foods that are rich in calcium include milk, cheese, and broccoli. Foods that are rich in folate include chickpeas, kidney beans, and spinach. HOME CARE INSTRUCTIONS Keep all follow-up appointments as directed by your caregiver. You may need periodic exams to check for polyps. SEEK MEDICAL CARE IF: You notice bleeding during a bowel movement.   This information is not intended to replace advice given to you by your health care provider. Make sure you discuss any questions you have with your health care provider.   Document Released: 06/20/2004 Document Revised: 10/15/2014 Document Reviewed: 12/04/2011 Elsevier Interactive Patient Education [employer].

## 2015-10-11 NOTE — H&P (View-Only) (Signed)
Referring Provider: Chipper Herb, MD Primary Care Physician:  Redge Gainer, MD Primary GI:  Dr. Gala Romney  Chief Complaint  Patient presents with  . Follow-up    HPI:   60 year old male presents for follow-up on previous colonoscopy and to schedule repeat surveillance colonoscopy. Last colonoscopy completed 01/23/2012 which found adequate prep, multiple colon polyps which were removed the largest being in nearly 2 cm polyp in the sigmoid on a long stalk. Pathology found all polyps to be tubular adenoma and recommended 3 year repeat surveillance colonoscopy.  Today he states he's doing well. Denies abdominal pain, N/V, hematochezia, melena, fever, chills, unintentional weight loss. GERD symptoms well controlled on current PPI. Denies dysphagia.Denies chest pain, dyspnea, dizziness, lightheadedness, syncope, near syncope. Denies any other upper or lower GI symptoms.  Past Medical History  Diagnosis Date  . Hypercholesteremia   . Headache(784.0)     history of migraines  . GERD (gastroesophageal reflux disease)   . Hypertension     Past Surgical History  Procedure Laterality Date  . Bilateral inguinal hernia repair    . Colonoscopy  01/23/2012    Procedure: COLONOSCOPY;  Surgeon: Daneil Dolin, MD;  Location: [facility];  Service: Endoscopy;  Laterality: N/A;  9:30 AM    Current Outpatient Prescriptions  Medication Sig Dispense Refill  . aspirin EC 81 MG tablet Take 81 mg by mouth every evening.    . cholecalciferol (VITAMIN D) 1000 UNITS tablet Take 2,000 Units by mouth every evening. Pt takes two tablets daily    . CRESTOR 20 MG tablet Take 1 tablet (20 mg total) by mouth daily. 90 tablet 3  . Cyanocobalamin (B-12) 5000 MCG SUBL Place 1 tablet under the tongue every evening.    Javier Docker Oil 300 MG CAPS Take 1 capsule by mouth every evening.    . loratadine (CLARITIN) 10 MG tablet Take 10 mg by mouth every morning.    . Multiple Vitamin (MULTIVITAMIN) tablet Take 1 tablet  by mouth every evening.     . pantoprazole (PROTONIX) 40 MG tablet TAKE 1 TABLET BY MOUTH DAILY 90 tablet 0  . losartan (COZAAR) 50 MG tablet Take 1 tablet (50 mg total) by mouth daily. (Patient not taking: Reported on 09/14/2015) 90 tablet 3   No current facility-administered medications for this visit.    Allergies as of 09/14/2015  . (No Known Allergies)    Family History  Problem Relation Age of Onset  . Colon cancer Neg Hx   . Heart attack Mother   . Heart attack Father     Social History   Social History  . Marital Status: Married    Spouse Name: N/A  . Number of Children: N/A  . Years of Education: N/A   Social History Main Topics  . Smoking status: Current Every Day Smoker -- 1.00 packs/day for 30 years    Types: Cigarettes  . Smokeless tobacco: None  . Alcohol Use: No  . Drug Use: No  . Sexual Activity: Not Asked   Other Topics Concern  . None   Social History Narrative    Review of Systems: General: Negative for anorexia, weight loss, fever, chills, fatigue, weakness. ENT: Negative for hoarseness, difficulty swallowing. CV: Negative for chest pain, angina, palpitations, peripheral edema.  Respiratory: Negative for dyspnea at rest, cough, sputum, wheezing.  GI: See history of present illness. Endo: Negative for unusual weight change.  Heme: Negative for bruising or bleeding.   Physical Exam: BP 138/100  mmHg  Pulse 99  Temp(Src) 97.4 F (36.3 C)  Ht '5\' 11"'$  (1.803 m)  Wt 156 lb (70.761 kg)  BMI 21.77 kg/m2 General:   Alert and oriented. Pleasant and cooperative. Well-nourished and well-developed.  Head:  Normocephalic and atraumatic. Eyes:  Without icterus, sclera clear and conjunctiva pink.  Ears:  Normal auditory acuity. Cardiovascular:  S1, S2 present without murmurs appreciated. Extremities without clubbing or edema. Respiratory:  Clear to auscultation bilaterally. No wheezes, rales, or rhonchi. No distress.  Gastrointestinal:  +BS, soft,  non-tender and non-distended. No HSM noted. No guarding or rebound. No masses appreciated.  Rectal:  Deferred  Neurologic:  Alert and oriented x4;  grossly normal neurologically. Psych:  Alert and cooperative. Normal mood and affect.    09/14/2015 8:11 AM

## 2015-10-11 NOTE — H&P (View-Only) (Signed)
CC'ED TO PCP 

## 2015-10-12 ENCOUNTER — Encounter: Payer: Self-pay | Admitting: Internal Medicine

## 2015-10-14 ENCOUNTER — Encounter (HOSPITAL_COMMUNITY): Payer: Self-pay | Admitting: Internal Medicine

## 2015-11-10 ENCOUNTER — Other Ambulatory Visit: Payer: Self-pay | Admitting: Family Medicine

## 2015-11-10 MED FILL — PANTOPRAZOLE SOD DR 40 MG T: 40 | 90 days supply | Qty: 90 | Fill #0

## 2016-01-17 ENCOUNTER — Encounter: Payer: Self-pay | Admitting: Family Medicine

## 2016-01-17 ENCOUNTER — Ambulatory Visit (INDEPENDENT_AMBULATORY_CARE_PROVIDER_SITE_OTHER): Payer: 59 | Admitting: Family Medicine

## 2016-01-17 VITALS — BP 150/107 | HR 104 | Temp 98.1°F | Ht 71.0 in | Wt 156.0 lb

## 2016-01-17 DIAGNOSIS — E559 Vitamin D deficiency, unspecified: Secondary | ICD-10-CM

## 2016-01-17 DIAGNOSIS — Z23 Encounter for immunization: Secondary | ICD-10-CM | POA: Diagnosis not present

## 2016-01-17 DIAGNOSIS — K219 Gastro-esophageal reflux disease without esophagitis: Secondary | ICD-10-CM | POA: Diagnosis not present

## 2016-01-17 DIAGNOSIS — N400 Enlarged prostate without lower urinary tract symptoms: Secondary | ICD-10-CM

## 2016-01-17 DIAGNOSIS — E785 Hyperlipidemia, unspecified: Secondary | ICD-10-CM | POA: Diagnosis not present

## 2016-01-17 DIAGNOSIS — E119 Type 2 diabetes mellitus without complications: Secondary | ICD-10-CM | POA: Diagnosis not present

## 2016-01-17 DIAGNOSIS — I10 Essential (primary) hypertension: Secondary | ICD-10-CM

## 2016-01-17 LAB — BAYER DCA HB A1C WAIVED: HB A1C (BAYER DCA - WAIVED): 5.7 % (ref <7.0)

## 2016-01-17 MED ORDER — VALSARTAN 160 MG PO TABS: 160.0000 mg | ORAL_TABLET | Freq: Every day | ORAL | Status: DC

## 2016-01-17 MED FILL — VALSARTAN 160 MG TABLET: 160 | 90 days supply | Qty: 90 | Fill #0

## 2016-01-17 NOTE — Patient Instructions (Addendum)
Continue current medications. Continue good therapeutic lifestyle changes which include good diet and exercise. Fall precautions discussed with patient. If an FOBT was given today- please return it to our front desk. If you are over 59 years old - you may need Prevnar 5 or the adult Pneumonia vaccine.  **Flu shots are available--- please call and schedule a FLU-CLINIC appointment**  After your visit with [place] today you will receive a survey in the mail or online from [employer] regarding your care with [place]. Please take a moment to fill this out. Your feedback is very important to [place] as you can help [place] better understand your patient needs as well as improve your experience and satisfaction. WE CARE ABOUT YOU!!!   Review blood pressures in 4 weeks and get BMP Start lowSartin 50 mg 1 daily  Continue to watch salt intake closely and make every effort at stopping smoking Check some blood sugars at home over the next 3-4 months and bring these readings in for review also

## 2016-01-17 NOTE — Progress Notes (Signed)
Subjective:    Patient ID: Bradley Goodman, male    DOB: [birth year], 60 y.o.   MRN: [id number]  HPI Pt here for follow up and management of chronic medical problems which includes diabetes, hyperlipidemia, and hypertension. He is taking medications regularly.She shall, the patient has no complaints. He does have a diagnosis of diabetes that is controlled with his diet. He always says his blood pressures are excellent and at home. They're always high in the office. He continues to take Crestor and Fish oil. He also takes Claritin for his allergies and pantoprazole for his reflux. The patient does not check blood sugars at home. He does check blood pressures and sometimes they run as high as 160-180 over the 70s. The patient denies any chest pain shortness of breath trouble swallowing heartburn indigestion nausea vomiting diarrhea or blood in the stool. He did have a colonoscopy in January and this did find some polyps and he has to have this repeated in 3 years. He is passing his water without problems. He stays active physically and has no discomfort. He continues to smoke.       Patient Active Problem List   Diagnosis Date Noted  . History of colonic polyps   . History of adenomatous polyp of colon 09/14/2015  . GERD (gastroesophageal reflux disease) 06/24/2013  . Diabetes mellitus type 2 controlled 06/24/2013  . Hyperlipidemia 06/24/2013  . Hypertension 06/24/2013   Outpatient Encounter Prescriptions as of 01/17/2016  Medication Sig  . aspirin EC 81 MG tablet Take 81 mg by mouth every evening.  . cholecalciferol (VITAMIN D) 1000 UNITS tablet Take 2,000 Units by mouth every evening. Pt takes two tablets daily  . CRESTOR 20 MG tablet Take 1 tablet (20 mg total) by mouth daily.  Javier Docker Oil 300 MG CAPS Take 1 capsule by mouth every evening.  . loratadine (CLARITIN) 10 MG tablet Take 10 mg by mouth every morning.  . pantoprazole (PROTONIX) 40 MG tablet TAKE 1 TABLET BY MOUTH DAILY  .  Cyanocobalamin (B-12) 5000 MCG SUBL Place 1 tablet under the tongue every evening. Reported on 01/17/2016  . Multiple Vitamin (MULTIVITAMIN) tablet Take 1 tablet by mouth every evening. Reported on 01/17/2016  . [DISCONTINUED] peg 3350 powder (MOVIPREP) 100 G SOLR Take 1 kit (200 g total) by mouth as directed.   No facility-administered encounter medications on file as of 01/17/2016.      Review of Systems  Constitutional: Negative.   HENT: Negative.   Eyes: Negative.   Respiratory: Negative.   Cardiovascular: Negative.   Gastrointestinal: Negative.   Endocrine: Negative.   Genitourinary: Negative.   Musculoskeletal: Negative.   Skin: Negative.   Allergic/Immunologic: Negative.   Neurological: Negative.   Hematological: Negative.   Psychiatric/Behavioral: Negative.        Objective:   Physical Exam  Constitutional: He is oriented to person, place, and time. He appears well-developed and well-nourished. No distress.  HENT:  Head: Normocephalic and atraumatic.  Right Ear: External ear normal.  Left Ear: External ear normal.  Nose: Nose normal.  Mouth/Throat: Oropharynx is clear and moist. No oropharyngeal exudate.  Eyes: Conjunctivae and EOM are normal. Pupils are equal, round, and reactive to light. Right eye exhibits no discharge. Left eye exhibits no discharge. No scleral icterus.  Neck: Normal range of motion. Neck supple. No thyromegaly present.  Cardiovascular: Normal rate, regular rhythm, normal heart sounds and intact distal pulses.   No murmur heard. The rhythm is regular at 96/m  Pulmonary/Chest: Effort normal and breath sounds normal. No respiratory distress. He has no wheezes. He has no rales. He exhibits no tenderness.  Abdominal: Soft. Bowel sounds are normal. He exhibits no mass. There is no tenderness. There is no rebound and no guarding.  Musculoskeletal: Normal range of motion. He exhibits no edema or tenderness.  Lymphadenopathy:    He has no cervical  adenopathy.  Neurological: He is alert and oriented to person, place, and time. He has normal reflexes. No cranial nerve deficit.  Skin: Skin is warm and dry. No rash noted.  Psychiatric: He has a normal mood and affect. His behavior is normal. Judgment and thought content normal.  Nursing note and vitals reviewed.  BP 150/107 mmHg  Pulse 104  Temp(Src) 98.1 F (36.7 C) (Oral)  Ht _0  (1.803 m)  Wt 156 lb (70.761 kg)  BMI 21.77 kg/m2        Assessment & Plan:  1. Type 2 diabetes mellitus without complication, without long-term current use of insulin (HCC) -Check blood sugars more regularly at home - Bayer DCA Hb A1c Waived - BMP8+EGFR - CBC with Differential/Platelet  2. Hyperlipidemia -Continue to follow-up aggressive therapeutic lifestyle changes and continue with current treatment pending results of lab work - CBC with Differential/Platelet - NMR, lipoprofile  3. Essential hypertension -Start valsartan 160 mg 1 daily - BMP8+EGFR - CBC with Differential/Platelet - Hepatic function panel - valsartan (DIOVAN) 160 MG tablet; Take 1 tablet (160 mg total) by mouth daily.  Dispense: 90 tablet; Refill: 1  4. Vitamin D deficiency -Continue current treatment pending results of lab work - CBC with Differential/Platelet - VITAMIN D 25 Hydroxy (Vit-D Deficiency, Fractures)  5. Gastroesophageal reflux disease, esophagitis presence not specified -Continue pantoprazole - CBC with Differential/Platelet - Hepatic function panel  6. BPH (benign prostatic hyperplasia) -No complaints with voiding - CBC with Differential/Platelet  Meds ordered this encounter  Medications  . valsartan (DIOVAN) 160 MG tablet    Sig: Take 1 tablet (160 mg total) by mouth daily.    Dispense:  90 tablet    Refill:  1   Patient Instructions  Continue current medications. Continue good therapeutic lifestyle changes which include good diet and exercise. Fall precautions discussed with  patient. If an FOBT was given today- please return it to our front desk. If you are over 40 years old - you may need Prevnar 33 or the adult Pneumonia vaccine.  **Flu shots are available--- please call and schedule a FLU-CLINIC appointment**  After your visit with [place] today you will receive a survey in the mail or online from [employer] regarding your care with [place]. Please take a moment to fill this out. Your feedback is very important to [place] as you can help [place] better understand your patient needs as well as improve your experience and satisfaction. WE CARE ABOUT YOU!!!   Review blood pressures in 4 weeks and get BMP Start lowSartin 50 mg 1 daily  Continue to watch salt intake closely and make every effort at stopping smoking Check some blood sugars at home over the next 3-4 months and bring these readings in for review also    Arrie Senate MD

## 2016-01-17 NOTE — Addendum Note (Signed)
Addended by: Zannie Cove on: 01/17/2016 10:21 AM   Modules accepted: Orders

## 2016-01-18 LAB — NMR, LIPOPROFILE
CHOLESTEROL: 114 mg/dL (ref 100–199)
HDL Cholesterol by NMR: 31 mg/dL — ABNORMAL LOW (ref >39)
HDL Particle Number: 27.3 umol/L — ABNORMAL LOW (ref >=30.5)
LDL PARTICLE NUMBER: 968 nmol/L (ref <1000)
LDL Size: 20.2 nm (ref >20.5)
LDL-C: 57 mg/dL (ref 0–99)
LP-IR SCORE: 59 — AB (ref <=45)
Small LDL Particle Number: 569 nmol/L — ABNORMAL HIGH (ref <=527)
Triglycerides by NMR: 131 mg/dL (ref 0–149)

## 2016-01-18 LAB — CBC WITH DIFFERENTIAL/PLATELET
BASOS ABS: 0 10*3/uL (ref 0.0–0.2)
Basos: 0 %
EOS (ABSOLUTE): 0.1 10*3/uL (ref 0.0–0.4)
EOS: 1 %
HEMATOCRIT: 48.1 % (ref 37.5–51.0)
HEMOGLOBIN: 16.3 g/dL (ref 12.6–17.7)
IMMATURE GRANS (ABS): 0 10*3/uL (ref 0.0–0.1)
Immature Granulocytes: 0 %
LYMPHS ABS: 2 10*3/uL (ref 0.7–3.1)
LYMPHS: 23 %
MCH: 29.9 pg (ref 26.6–33.0)
MCHC: 33.9 g/dL (ref 31.5–35.7)
MCV: 88 fL (ref 79–97)
MONOCYTES: 6 %
Monocytes Absolute: 0.5 10*3/uL (ref 0.1–0.9)
NEUTROS ABS: 5.8 10*3/uL (ref 1.4–7.0)
Neutrophils: 70 %
Platelets: 214 10*3/uL (ref 150–379)
RBC: 5.46 x10E6/uL (ref 4.14–5.80)
RDW: 14.1 % (ref 12.3–15.4)
WBC: 8.5 10*3/uL (ref 3.4–10.8)

## 2016-01-18 LAB — VITAMIN D 25 HYDROXY (VIT D DEFICIENCY, FRACTURES): VIT D 25 HYDROXY: 60.6 ng/mL (ref 30.0–100.0)

## 2016-01-18 LAB — BMP8+EGFR
BUN / CREAT RATIO: 17 (ref 9–20)
BUN: 16 mg/dL (ref 6–24)
CALCIUM: 9.8 mg/dL (ref 8.7–10.2)
CO2: 27 mmol/L (ref 18–29)
CREATININE: 0.96 mg/dL (ref 0.76–1.27)
Chloride: 99 mmol/L (ref 96–106)
GFR, EST AFRICAN AMERICAN: 100 mL/min/{1.73_m2} (ref >59)
GFR, EST NON AFRICAN AMERICAN: 87 mL/min/{1.73_m2} (ref >59)
Glucose: 102 mg/dL — ABNORMAL HIGH (ref 65–99)
Potassium: 4.9 mmol/L (ref 3.5–5.2)
Sodium: 142 mmol/L (ref 134–144)

## 2016-01-18 LAB — HEPATIC FUNCTION PANEL
ALBUMIN: 4.3 g/dL (ref 3.5–5.5)
ALK PHOS: 75 IU/L (ref 39–117)
ALT: 15 IU/L (ref 0–44)
AST: 13 IU/L (ref 0–40)
BILIRUBIN, DIRECT: 0.13 mg/dL (ref 0.00–0.40)
Bilirubin Total: 0.5 mg/dL (ref 0.0–1.2)
TOTAL PROTEIN: 6.7 g/dL (ref 6.0–8.5)

## 2016-02-08 MED FILL — PANTOPRAZOLE SOD DR 40 MG T: 40 | 90 days supply | Qty: 90 | Fill #1

## 2016-02-10 ENCOUNTER — Telehealth: Payer: Self-pay | Admitting: Family Medicine

## 2016-02-10 MED ORDER — OSELTAMIVIR PHOSPHATE 75 MG PO CAPS: 75.0000 mg | ORAL_CAPSULE | Freq: Every day | ORAL | Status: DC

## 2016-02-10 NOTE — Telephone Encounter (Signed)
Please call in Tamiflu 75 mg 1 daily for 10 days

## 2016-02-10 NOTE — Telephone Encounter (Signed)
Patient aware that Rx has been sent to pharmacy.

## 2016-03-14 ENCOUNTER — Other Ambulatory Visit: Payer: Self-pay | Admitting: Family Medicine

## 2016-03-14 MED FILL — ROSUVASTATIN CALCIUM 20 MG: 20 | 90 days supply | Qty: 90 | Fill #0

## 2016-05-17 ENCOUNTER — Encounter: Payer: Self-pay | Admitting: *Deleted

## 2016-05-17 DIAGNOSIS — H524 Presbyopia: Secondary | ICD-10-CM | POA: Diagnosis not present

## 2016-05-17 DIAGNOSIS — H5202 Hypermetropia, left eye: Secondary | ICD-10-CM | POA: Diagnosis not present

## 2016-05-17 DIAGNOSIS — H5211 Myopia, right eye: Secondary | ICD-10-CM | POA: Diagnosis not present

## 2016-05-17 DIAGNOSIS — H52223 Regular astigmatism, bilateral: Secondary | ICD-10-CM | POA: Diagnosis not present

## 2016-06-12 ENCOUNTER — Encounter: Payer: Self-pay | Admitting: Family Medicine

## 2016-06-12 ENCOUNTER — Ambulatory Visit (INDEPENDENT_AMBULATORY_CARE_PROVIDER_SITE_OTHER): Payer: 59 | Admitting: Family Medicine

## 2016-06-12 VITALS — BP 151/103 | HR 97 | Temp 97.8°F | Ht 71.0 in | Wt 153.0 lb

## 2016-06-12 DIAGNOSIS — L03319 Cellulitis of trunk, unspecified: Secondary | ICD-10-CM | POA: Diagnosis not present

## 2016-06-12 DIAGNOSIS — I10 Essential (primary) hypertension: Secondary | ICD-10-CM | POA: Diagnosis not present

## 2016-06-12 DIAGNOSIS — E785 Hyperlipidemia, unspecified: Secondary | ICD-10-CM | POA: Diagnosis not present

## 2016-06-12 DIAGNOSIS — K219 Gastro-esophageal reflux disease without esophagitis: Secondary | ICD-10-CM

## 2016-06-12 DIAGNOSIS — E119 Type 2 diabetes mellitus without complications: Secondary | ICD-10-CM | POA: Diagnosis not present

## 2016-06-12 DIAGNOSIS — E559 Vitamin D deficiency, unspecified: Secondary | ICD-10-CM

## 2016-06-12 LAB — BAYER DCA HB A1C WAIVED: HB A1C: 5.8 % (ref <7.0)

## 2016-06-12 MED ORDER — CEPHALEXIN 500 MG PO CAPS: 500.0000 mg | ORAL_CAPSULE | Freq: Three times a day (TID) | ORAL | 0 refills | Status: DC

## 2016-06-12 MED ORDER — PANTOPRAZOLE SODIUM 40 MG PO TBEC: 40.0000 mg | DELAYED_RELEASE_TABLET | Freq: Every day | ORAL | 3 refills | Status: DC

## 2016-06-12 MED FILL — PANTOPRAZOLE SOD DR 40 MG T: 40 | 90 days supply | Qty: 90 | Fill #0

## 2016-06-12 NOTE — Patient Instructions (Addendum)
Medicare Annual Wellness Visit  [facility] and the medical providers at [facility] strive to bring you the best medical care.  In doing so we not only want to address your current medical conditions and concerns but also to detect new conditions early and prevent illness, disease and health-related problems.    Medicare offers a yearly Wellness Visit which allows our clinical staff to assess your need for preventative services including immunizations, lifestyle education, counseling to decrease risk of preventable diseases and screening for fall risk and other medical concerns.    This visit is provided free of charge (no copay) for all Medicare recipients. The clinical pharmacists at [facility] have begun to conduct these Wellness Visits which will also include a thorough review of all your medications.    As you primary medical provider recommend that you make an appointment for your Annual Wellness Visit if you have not done so already this year.  You may set up this appointment before you leave today or you may call back [phone]) and schedule an appointment.  Please make sure when you call that you mention that you are scheduling your Annual Wellness Visit with the clinical pharmacist so that the appointment may be made for the proper length of time.     Continue current medications. Continue good therapeutic lifestyle changes which include good diet and exercise. Fall precautions discussed with patient. If an FOBT was given today- please return it to our front desk. If you are over 9 years old - you may need Prevnar 51 or the adult Pneumonia vaccine.   After your visit with [place] today you will receive a survey in the mail or online from [employer] regarding your care with [place]. Please take a moment to fill this out. Your feedback is very important to [place] as you can help [place] better understand your patient needs as well as  improve your experience and satisfaction. WE CARE ABOUT YOU!!!   Continue to monitor blood sugars and blood pressures at home. Watch sodium intake Apply Bactroban ointment to the area above the anus at least twice daily and take antibiotic as directed Call in a couple weeks if this is not getting better Take every effort at stopping smoking

## 2016-06-12 NOTE — Progress Notes (Signed)
Subjective:    Patient ID: Bradley Goodman, male    DOB: 1955/11/02, 60 y.o.   MRN: [id number]  HPI Pt here for follow up and management of chronic medical problems which includes diabetes, hypertension and hyperlipidemia. He is taking medications regularly.This patient has a history of diabetes mellitus type 2 hyperlipidemia hypertension and GERD. He does complain today of a knot on his left hip. The patient denies any chest pain shortness of breath trouble swallowing heartburn indigestion nausea vomiting diarrhea or blood in the stool. He is up-to-date on his colonoscopies. He does complain with this irritation above his anus. It is gotten worse over the past week. He indicates that his blood sugars at home are running in the 80s fasting.     Patient Active Problem List   Diagnosis Date Noted  . History of colonic polyps   . History of adenomatous polyp of colon 09/14/2015  . GERD (gastroesophageal reflux disease) 06/24/2013  . Diabetes mellitus type 2 controlled 06/24/2013  . Hyperlipidemia 06/24/2013  . Hypertension 06/24/2013   Outpatient Encounter Prescriptions as of 06/12/2016  Medication Sig  . aspirin EC 81 MG tablet Take 81 mg by mouth every evening.  . cholecalciferol (VITAMIN D) 1000 UNITS tablet Take 2,000 Units by mouth every evening. Pt takes two tablets daily  . Cyanocobalamin (B-12) 5000 MCG SUBL Place 1 tablet under the tongue every evening. Reported on 01/17/2016  . Krill Oil 300 MG CAPS Take 1 capsule by mouth every evening.  . loratadine (CLARITIN) 10 MG tablet Take 10 mg by mouth every morning.  . Multiple Vitamin (MULTIVITAMIN) tablet Take 1 tablet by mouth every evening. Reported on 01/17/2016  . pantoprazole (PROTONIX) 40 MG tablet TAKE 1 TABLET BY MOUTH DAILY  . rosuvastatin (CRESTOR) 20 MG tablet TAKE 1 TABLET BY MOUTH DAILY  . [DISCONTINUED] oseltamivir (TAMIFLU) 75 MG capsule Take 1 capsule (75 mg total) by mouth daily.  . [DISCONTINUED] valsartan (DIOVAN) 160  MG tablet Take 1 tablet (160 mg total) by mouth daily.   No facility-administered encounter medications on file as of 06/12/2016.       Review of Systems  Constitutional: Negative.   HENT: Negative.   Eyes: Negative.   Respiratory: Negative.   Cardiovascular: Negative.   Gastrointestinal: Negative.   Endocrine: Negative.   Genitourinary: Negative.   Musculoskeletal: Negative.   Skin: Negative.        Left hip - knot  Allergic/Immunologic: Negative.   Neurological: Negative.   Hematological: Negative.   Psychiatric/Behavioral: Negative.        Objective:   Physical Exam  Constitutional: He is oriented to person, place, and time. He appears well-developed and well-nourished. No distress.  The patient is alert and pleasant  HENT:  Head: Normocephalic and atraumatic.  Right Ear: External ear normal.  Left Ear: External ear normal.  Nose: Nose normal.  Mouth/Throat: Oropharynx is clear and moist. No oropharyngeal exudate.  Eyes: Conjunctivae and EOM are normal. Pupils are equal, round, and reactive to light. Right eye exhibits no discharge. Left eye exhibits no discharge. No scleral icterus.  The patient had his eye exam in August at my eye doctor  Neck: Normal range of motion. Neck supple. No thyromegaly present.  No bruits thyromegaly or anterior cervical adenopathy  Cardiovascular: Normal rate, regular rhythm, normal heart sounds and intact distal pulses.   No murmur heard. The heart had a regular rate and rhythm at 72/m  Pulmonary/Chest: Effort normal and breath sounds normal. No respiratory  distress. He has no wheezes. He has no rales. He exhibits no tenderness.  Clear anteriorly and posteriorly no axillary adenopathy  Abdominal: Soft. Bowel sounds are normal. He exhibits no mass. There is no tenderness. There is no rebound and no guarding.  No abdominal tenderness masses or bruits and no inguinal adenopathy  Musculoskeletal: Normal range of motion. He exhibits no edema.   Lymphadenopathy:    He has no cervical adenopathy.  Neurological: He is alert and oriented to person, place, and time. He has normal reflexes. No cranial nerve deficit.  Skin: Skin is warm and dry. No rash noted.  Psychiatric: He has a normal mood and affect. His behavior is normal. Judgment and thought content normal.  Nursing note and vitals reviewed.  BP (!) 143/99 (BP Location: Right Arm)   Pulse 93   Temp 97.8 F (36.6 C) (Oral)   Ht '5\' 11"'  (1.803 m)   Wt 153 lb (69.4 kg)   BMI 21.34 kg/m         Assessment & Plan:  1. Type 2 diabetes mellitus without complication, without long-term current use of insulin (HCC) -Continue current treatment and aggressive therapeutic lifestyle changes ending results of A1c - BMP8+EGFR - CBC with Differential/Platelet - Bayer DCA Hb A1c Waived  2. Hyperlipidemia -Continue current treatment and aggressive therapeutic lifestyle changes pending results of lab work - CBC with Differential/Platelet - Hepatic function panel - NMR, lipoprofile  3. Essential hypertension -The blood pressure is elevated today and this patient frequently has elevated blood pressures in the office. He will continue to monitor his blood pressures closely at home and restrict his sodium intake. - BMP8+EGFR - CBC with Differential/Platelet - Hepatic function panel  4. Gastroesophageal reflux disease, esophagitis presence not specified -He is doing well with this as long as he continues with his proton X - CBC with Differential/Platelet - Hepatic function panel  5. Vitamin D deficiency -Continue current treatment pending results of lab work - CBC with Differential/Platelet - VITAMIN D 25 Hydroxy (Vit-D Deficiency, Fractures)  6. Cellulitis of sacral region -Take Keflex 503 times daily and use Bactroban ointment as directed  Meds ordered this encounter  Medications  . pantoprazole (PROTONIX) 40 MG tablet    Sig: Take 1 tablet (40 mg total) by mouth daily.     Dispense:  90 tablet    Refill:  3  . cephALEXin (KEFLEX) 500 MG capsule    Sig: Take 1 capsule (500 mg total) by mouth 3 (three) times daily.    Dispense:  30 capsule    Refill:  0   Patient Instructions                       Medicare Annual Wellness Visit  [facility] and the medical providers at [facility] strive to bring you the best medical care.  In doing so we not only want to address your current medical conditions and concerns but also to detect new conditions early and prevent illness, disease and health-related problems.    Medicare offers a yearly Wellness Visit which allows our clinical staff to assess your need for preventative services including immunizations, lifestyle education, counseling to decrease risk of preventable diseases and screening for fall risk and other medical concerns.    This visit is provided free of charge (no copay) for all Medicare recipients. The clinical pharmacists at [facility] have begun to conduct these Wellness Visits which will also include a  thorough review of all your medications.    As you primary medical provider recommend that you make an appointment for your Annual Wellness Visit if you have not done so already this year.  You may set up this appointment before you leave today or you may call back ([phone]) and schedule an appointment.  Please make sure when you call that you mention that you are scheduling your Annual Wellness Visit with the clinical pharmacist so that the appointment may be made for the proper length of time.     Continue current medications. Continue good therapeutic lifestyle changes which include good diet and exercise. Fall precautions discussed with patient. If an FOBT was given today- please return it to our front desk. If you are over 11 years old - you may need Prevnar 72 or the adult Pneumonia vaccine.   After your visit with [place] today you will receive a survey  in the mail or online from [employer] regarding your care with [place]. Please take a moment to fill this out. Your feedback is very important to [place] as you can help [place] better understand your patient needs as well as improve your experience and satisfaction. WE CARE ABOUT YOU!!!   Continue to monitor blood sugars and blood pressures at home. Watch sodium intake Apply Bactroban ointment to the area above the anus at least twice daily and take antibiotic as directed Call in a couple weeks if this is not getting better Take every effort at stopping smoking    Arrie Senate MD

## 2016-06-13 LAB — CBC WITH DIFFERENTIAL/PLATELET
Basophils Absolute: 0 10*3/uL (ref 0.0–0.2)
Basos: 0 %
EOS (ABSOLUTE): 0.1 10*3/uL (ref 0.0–0.4)
EOS: 1 %
Hematocrit: 46.9 % (ref 37.5–51.0)
Hemoglobin: 15.7 g/dL (ref 12.6–17.7)
IMMATURE GRANS (ABS): 0 10*3/uL (ref 0.0–0.1)
IMMATURE GRANULOCYTES: 0 %
LYMPHS ABS: 1.8 10*3/uL (ref 0.7–3.1)
Lymphs: 22 %
MCH: 29.8 pg (ref 26.6–33.0)
MCHC: 33.5 g/dL (ref 31.5–35.7)
MCV: 89 fL (ref 79–97)
MONOS ABS: 0.6 10*3/uL (ref 0.1–0.9)
Monocytes: 7 %
NEUTROS PCT: 70 %
Neutrophils Absolute: 5.8 10*3/uL (ref 1.4–7.0)
PLATELETS: 214 10*3/uL (ref 150–379)
RBC: 5.26 x10E6/uL (ref 4.14–5.80)
RDW: 13.9 % (ref 12.3–15.4)
WBC: 8.4 10*3/uL (ref 3.4–10.8)

## 2016-06-13 LAB — BMP8+EGFR
BUN/Creatinine Ratio: 20 (ref 9–20)
BUN: 20 mg/dL (ref 6–24)
CHLORIDE: 104 mmol/L (ref 96–106)
CO2: 27 mmol/L (ref 18–29)
CREATININE: 0.99 mg/dL (ref 0.76–1.27)
Calcium: 9.7 mg/dL (ref 8.7–10.2)
GFR calc Af Amer: 97 mL/min/{1.73_m2} (ref >59)
GFR calc non Af Amer: 84 mL/min/{1.73_m2} (ref >59)
GLUCOSE: 114 mg/dL — AB (ref 65–99)
POTASSIUM: 5.2 mmol/L (ref 3.5–5.2)
SODIUM: 144 mmol/L (ref 134–144)

## 2016-06-13 LAB — NMR, LIPOPROFILE
CHOLESTEROL: 95 mg/dL — AB (ref 100–199)
HDL CHOLESTEROL BY NMR: 31 mg/dL — AB (ref >39)
HDL PARTICLE NUMBER: 33 umol/L (ref >=30.5)
LDL Particle Number: 549 nmol/L (ref <1000)
LDL SIZE: 19.9 nm (ref >20.5)
LDL-C: 48 mg/dL (ref 0–99)
LP-IR Score: 64 — ABNORMAL HIGH (ref <=45)
SMALL LDL PARTICLE NUMBER: 384 nmol/L (ref <=527)
TRIGLYCERIDES BY NMR: 81 mg/dL (ref 0–149)

## 2016-06-13 LAB — HEPATIC FUNCTION PANEL
ALBUMIN: 4.2 g/dL (ref 3.5–5.5)
ALT: 15 IU/L (ref 0–44)
AST: 12 IU/L (ref 0–40)
Alkaline Phosphatase: 73 IU/L (ref 39–117)
BILIRUBIN TOTAL: 0.2 mg/dL (ref 0.0–1.2)
BILIRUBIN, DIRECT: 0.1 mg/dL (ref 0.00–0.40)
TOTAL PROTEIN: 6.5 g/dL (ref 6.0–8.5)

## 2016-06-13 LAB — VITAMIN D 25 HYDROXY (VIT D DEFICIENCY, FRACTURES): Vit D, 25-Hydroxy: 75.1 ng/mL (ref 30.0–100.0)

## 2016-06-14 ENCOUNTER — Ambulatory Visit: Payer: 59 | Admitting: Family Medicine

## 2016-07-12 MED FILL — ROSUVASTATIN CALCIUM 20 MG: 20 | 90 days supply | Qty: 90 | Fill #1

## 2016-07-18 ENCOUNTER — Other Ambulatory Visit: Payer: Self-pay | Admitting: *Deleted

## 2016-07-18 MED ORDER — SULFAMETHOXAZOLE-TRIMETHOPRIM 800-160 MG PO TABS: 1.0000 | ORAL_TABLET | Freq: Two times a day (BID) | ORAL | 0 refills | Status: DC

## 2016-07-18 MED FILL — SULFAMETHOXAZOLE/TMP DS TAB: 800-160 | 7 days supply | Qty: 14 | Fill #0

## 2016-09-27 MED FILL — PANTOPRAZOLE SOD DR 40 MG T: 40 | 90 days supply | Qty: 90 | Fill #1

## 2016-10-12 ENCOUNTER — Encounter (HOSPITAL_COMMUNITY): Payer: Self-pay

## 2016-10-12 ENCOUNTER — Emergency Department (HOSPITAL_COMMUNITY): Payer: 59

## 2016-10-12 ENCOUNTER — Observation Stay (HOSPITAL_COMMUNITY)
Admission: EM | Admit: 2016-10-12 | Discharge: 2016-10-13 | Disposition: A | Discharge status: Home / Self Care | Payer: 59 | Attending: Internal Medicine | Admitting: Internal Medicine

## 2016-10-12 DIAGNOSIS — K219 Gastro-esophageal reflux disease without esophagitis: Secondary | ICD-10-CM | POA: Diagnosis present

## 2016-10-12 DIAGNOSIS — I10 Essential (primary) hypertension: Secondary | ICD-10-CM | POA: Diagnosis not present

## 2016-10-12 DIAGNOSIS — R7303 Prediabetes: Secondary | ICD-10-CM

## 2016-10-12 DIAGNOSIS — I208 Other forms of angina pectoris: Secondary | ICD-10-CM | POA: Diagnosis not present

## 2016-10-12 DIAGNOSIS — E785 Hyperlipidemia, unspecified: Secondary | ICD-10-CM | POA: Diagnosis present

## 2016-10-12 DIAGNOSIS — E119 Type 2 diabetes mellitus without complications: Secondary | ICD-10-CM | POA: Insufficient documentation

## 2016-10-12 DIAGNOSIS — Z79899 Other long term (current) drug therapy: Secondary | ICD-10-CM | POA: Insufficient documentation

## 2016-10-12 DIAGNOSIS — R0789 Other chest pain: Secondary | ICD-10-CM | POA: Diagnosis present

## 2016-10-12 DIAGNOSIS — R202 Paresthesia of skin: Secondary | ICD-10-CM | POA: Diagnosis not present

## 2016-10-12 DIAGNOSIS — Z7982 Long term (current) use of aspirin: Secondary | ICD-10-CM | POA: Diagnosis not present

## 2016-10-12 DIAGNOSIS — I152 Hypertension secondary to endocrine disorders: Secondary | ICD-10-CM | POA: Diagnosis present

## 2016-10-12 DIAGNOSIS — E1159 Type 2 diabetes mellitus with other circulatory complications: Secondary | ICD-10-CM | POA: Diagnosis present

## 2016-10-12 DIAGNOSIS — R079 Chest pain, unspecified: Secondary | ICD-10-CM | POA: Diagnosis not present

## 2016-10-12 DIAGNOSIS — F17210 Nicotine dependence, cigarettes, uncomplicated: Secondary | ICD-10-CM | POA: Diagnosis not present

## 2016-10-12 LAB — CBC
HCT: 49.8 % (ref 39.0–52.0)
Hemoglobin: 16.5 g/dL (ref 13.0–17.0)
MCH: 29.9 pg (ref 26.0–34.0)
MCHC: 33.1 g/dL (ref 30.0–36.0)
MCV: 90.2 fL (ref 78.0–100.0)
Platelets: 216 K/uL (ref 150–400)
RBC: 5.52 MIL/uL (ref 4.22–5.81)
RDW: 13.6 % (ref 11.5–15.5)
WBC: 12.5 K/uL — ABNORMAL HIGH (ref 4.0–10.5)

## 2016-10-12 LAB — I-STAT TROPONIN, ED
Troponin i, poc: 0 ng/mL (ref 0.00–0.08)
Troponin i, poc: 0 ng/mL (ref 0.00–0.08)

## 2016-10-12 LAB — BASIC METABOLIC PANEL
Anion gap: 6 (ref 5–15)
BUN: 15 mg/dL (ref 6–20)
CO2: 29 mmol/L (ref 22–32)
CREATININE: 1.09 mg/dL (ref 0.61–1.24)
Calcium: 9.4 mg/dL (ref 8.9–10.3)
Chloride: 103 mmol/L (ref 101–111)
GFR calc Af Amer: >60 mL/min (ref >60)
GLUCOSE: 97 mg/dL (ref 65–99)
POTASSIUM: 3.8 mmol/L (ref 3.5–5.1)
Sodium: 138 mmol/L (ref 135–145)

## 2016-10-12 LAB — HEPATIC FUNCTION PANEL
ALT: 23 U/L (ref 17–63)
AST: 19 U/L (ref 15–41)
Albumin: 4.1 g/dL (ref 3.5–5.0)
Alkaline Phosphatase: 63 U/L (ref 38–126)
Bilirubin, Direct: 0.1 mg/dL (ref 0.1–0.5)
Indirect Bilirubin: 0.3 mg/dL (ref 0.3–0.9)
Total Bilirubin: 0.4 mg/dL (ref 0.3–1.2)
Total Protein: 6.7 g/dL (ref 6.5–8.1)

## 2016-10-12 LAB — D-DIMER, QUANTITATIVE: D-Dimer, Quant: 0.32 ug/mL-FEU (ref 0.00–0.50)

## 2016-10-12 IMAGING — DX DG CHEST 2V
2 series · 2 of 2 positions shown · non-contrast
Comparison: [DATE], [DATE]

CLINICAL DATA: Intermittent chest pain

EXAM:
CHEST  2 VIEW

[chest pa]
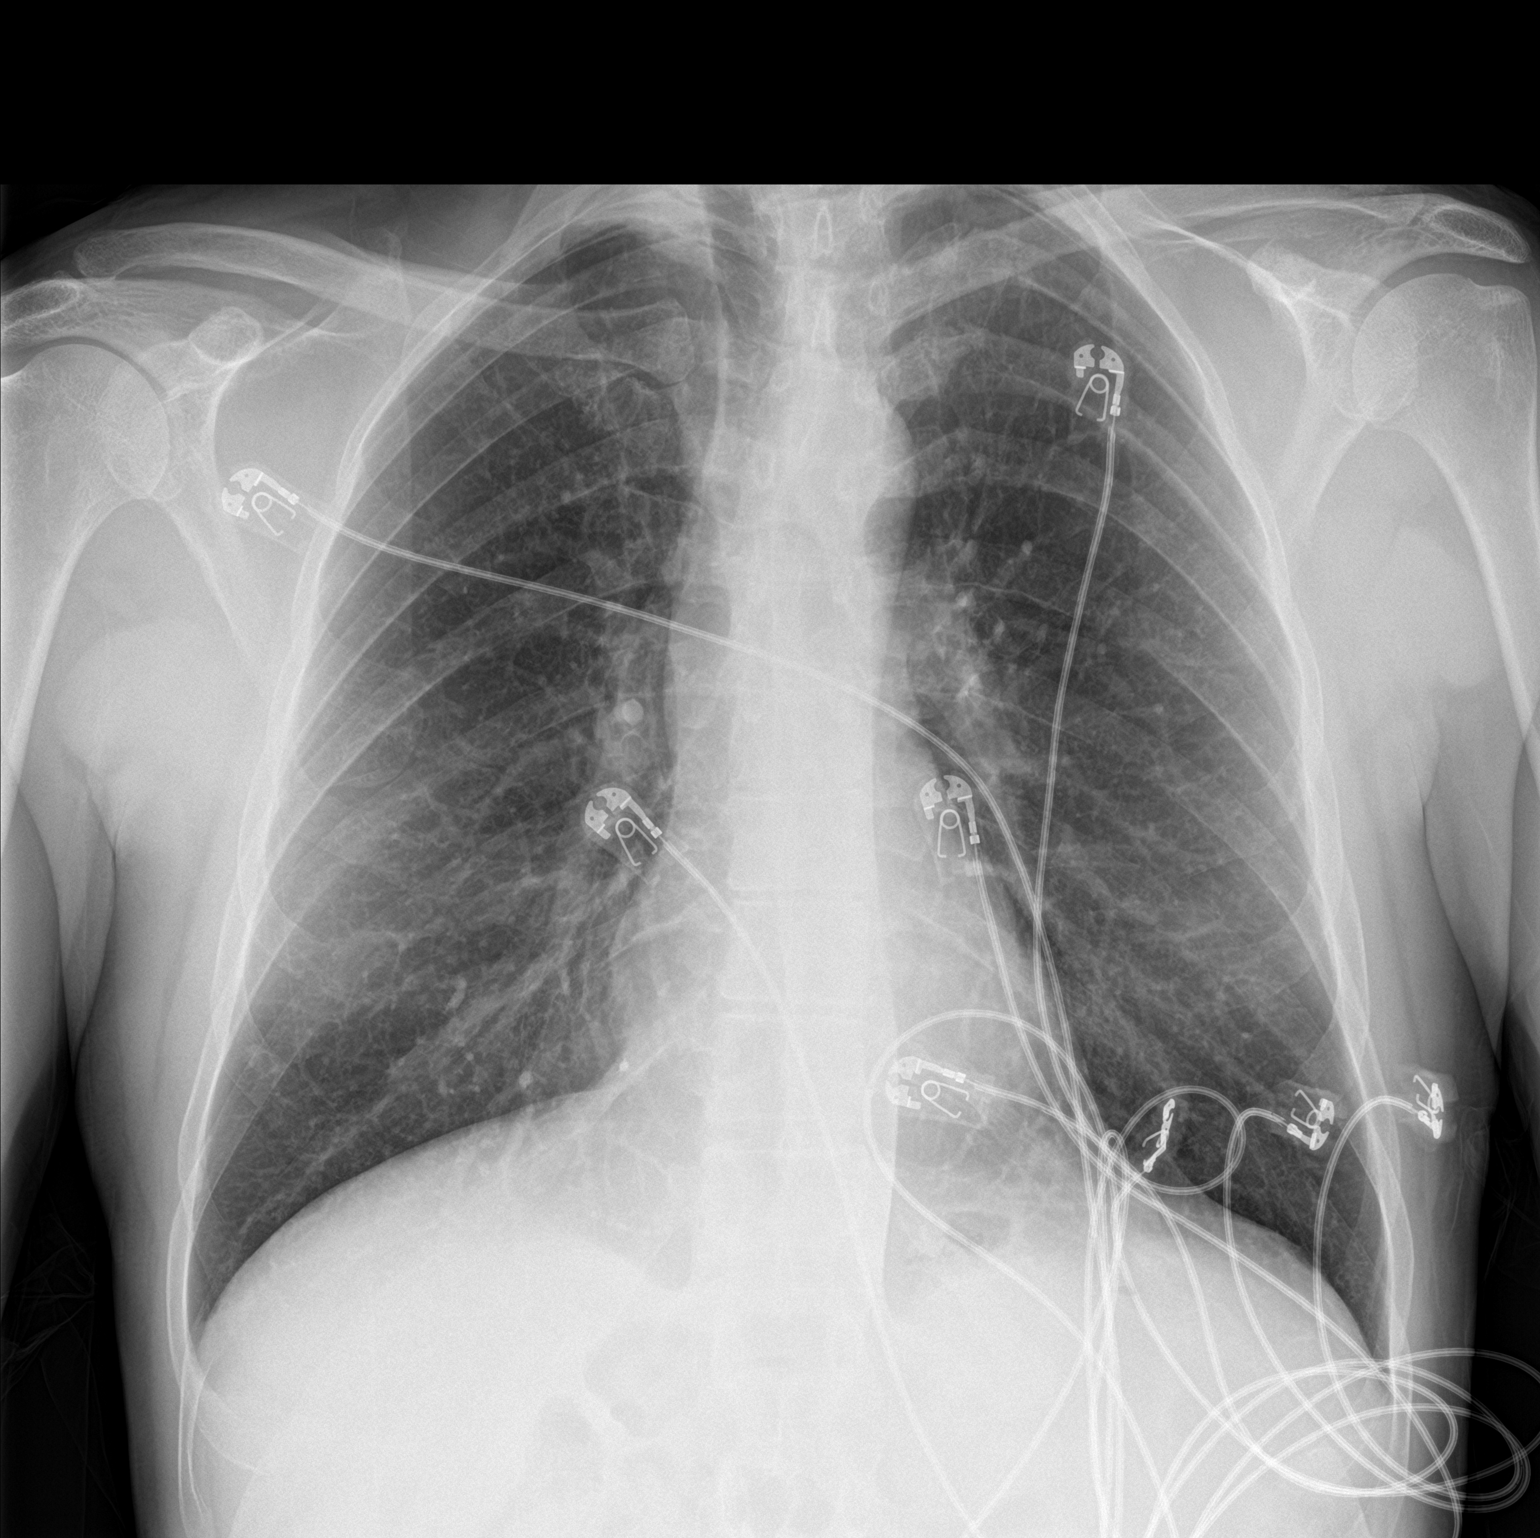

[chest lat]
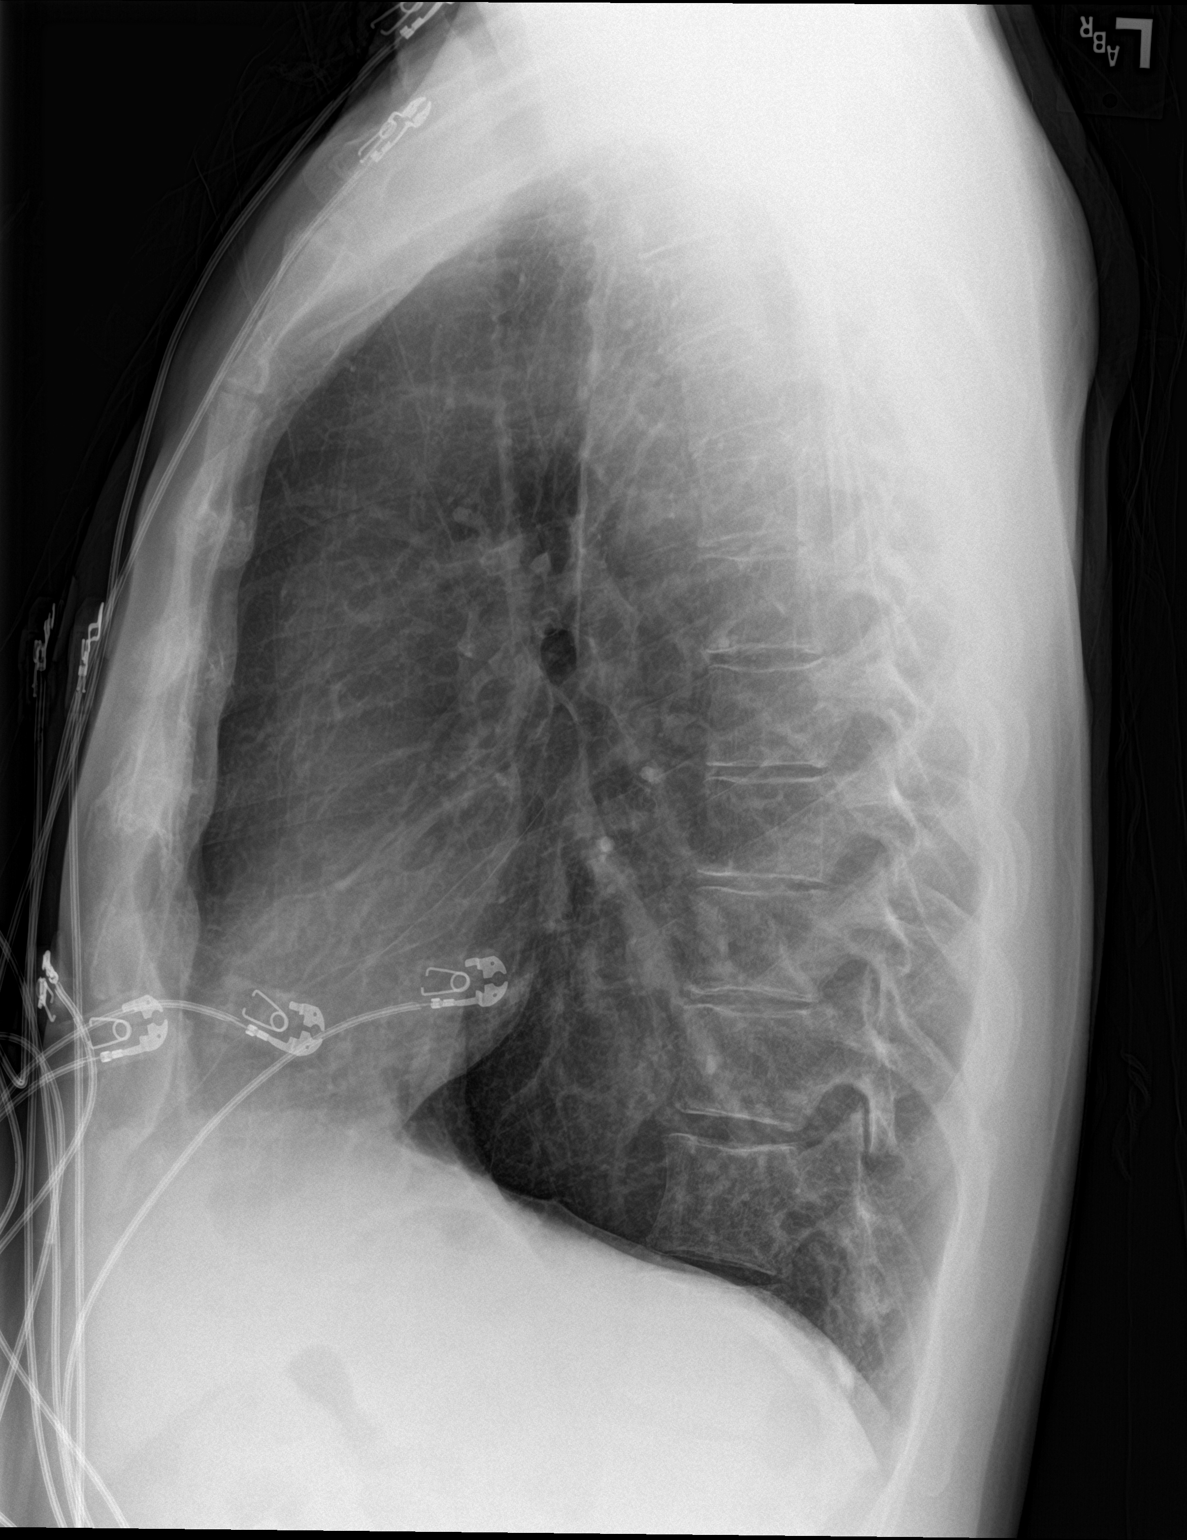

[2 of 2 positions shown; findings below may reference images not displayed]

FINDINGS: Mild hyperinflation. No acute infiltrate or effusion. Normal
cardiomediastinal silhouette. No pneumothorax. Nodules noted on CT
are not well appreciated radiographically.
IMPRESSION: No radiographic evidence for acute cardiopulmonary abnormality

## 2016-10-12 MED ORDER — LORAZEPAM 2 MG/ML IJ SOLN
1.0000 mg | Freq: Once | INTRAMUSCULAR | Status: AC
  Administered 2016-10-12: 1 mg via INTRAVENOUS
  Filled 2016-10-12: qty 1

## 2016-10-12 MED ORDER — NITROGLYCERIN 0.4 MG SL SUBL
0.4000 mg | SUBLINGUAL_TABLET | Freq: Once | SUBLINGUAL | Status: AC
  Administered 2016-10-12: 0.4 mg via SUBLINGUAL

## 2016-10-12 MED ORDER — NITROGLYCERIN 0.4 MG SL SUBL
0.4000 mg | SUBLINGUAL_TABLET | Freq: Once | SUBLINGUAL | Status: AC
  Administered 2016-10-12: 0.4 mg via SUBLINGUAL
  Filled 2016-10-12: qty 1

## 2016-10-12 NOTE — ED Provider Notes (Signed)
[facility] DEPT Provider Note   CSN: [id number] Arrival date & time: 10/12/16  2113  By signing my name below, I, Judithe Goodman, attest that this documentation has been prepared under the direction and in the presence of Bradley Ferguson, MD. Electronically Signed: Judithe Goodman, ER Scribe. 05/19/2016. 9:33 PM.  History   Chief Complaint Chief Complaint  Patient presents with  . Chest Pain   The history is provided by the patient. No language interpreter was used.  Chest Pain   This is a new problem. The current episode started 2 days ago. The problem occurs daily. The problem has been gradually worsening. The pain is mild. The quality of the pain is described as pressure-like. The pain radiates to the left arm. Associated symptoms include numbness. Pertinent negatives include no abdominal pain, no back pain, no cough and no headaches.  Pertinent negatives for past medical history include no seizures.    HPI Comments: Bradley Goodman is a 61 y.o. male who presents to the Emergency Department complaining of CP with associated left arm numbness, light headedness and throat tightness that started yesterday. He states his sx would last for 30 minutes and then go away, but today they became constant around 6:30pm. He denies SOB or diaphoresis. He has had two stress tests, both of which were within normal limits.    Past Medical History:  Diagnosis Date  . Complication of anesthesia   . GERD (gastroesophageal reflux disease)   . Headache(784.0)    history of migraines  . Hypercholesteremia   . Hypertension   . PONV (postoperative nausea and vomiting)     Patient Active Problem List   Diagnosis Date Noted  . History of colonic polyps   . History of adenomatous polyp of colon 09/14/2015  . GERD (gastroesophageal reflux disease) 06/24/2013  . Diabetes mellitus type 2 controlled 06/24/2013  . Hyperlipidemia 06/24/2013  . Hypertension 06/24/2013    Past Surgical History:    Procedure Laterality Date  . bilateral inguinal hernia repair    . COLONOSCOPY  01/23/2012   Procedure: COLONOSCOPY;  Surgeon: Daneil Dolin, MD;  [facility];  Service: Endoscopy;  Laterality: N/A;  9:30 AM  . COLONOSCOPY N/A 10/11/2015   Procedure: COLONOSCOPY;  Surgeon: Daneil Dolin, MD;  Location: [facility];  Service: Endoscopy;  Laterality: N/A;  830       Home Medications    Prior to Admission medications   Medication Sig Start Date End Date Taking? Authorizing Provider  aspirin EC 81 MG tablet Take 81 mg by mouth every evening.    Historical Provider, MD  cephALEXin (KEFLEX) 500 MG capsule Take 1 capsule (500 mg total) by mouth 3 (three) times daily. 06/12/16   Chipper Herb, MD  cholecalciferol (VITAMIN D) 1000 UNITS tablet Take 2,000 Units by mouth every evening. Pt takes two tablets daily    Historical Provider, MD  Cyanocobalamin (B-12) 5000 MCG SUBL Place 1 tablet under the tongue every evening. Reported on 01/17/2016    Historical Provider, MD  Javier Docker Oil 300 MG CAPS Take 1 capsule by mouth every evening.    Historical Provider, MD  loratadine (CLARITIN) 10 MG tablet Take 10 mg by mouth every morning.    Historical Provider, MD  Multiple Vitamin (MULTIVITAMIN) tablet Take 1 tablet by mouth every evening. Reported on 01/17/2016    Historical Provider, MD  pantoprazole (PROTONIX) 40 MG tablet Take 1 tablet (40 mg total) by mouth daily. 06/12/16  Chipper Herb, MD  rosuvastatin (CRESTOR) 20 MG tablet TAKE 1 TABLET BY MOUTH DAILY 03/14/16   Chipper Herb, MD  sulfamethoxazole-trimethoprim (BACTRIM DS) 800-160 MG tablet Take 1 tablet by mouth 2 (two) times daily. 07/18/16   Chipper Herb, MD    Family History Family History  Problem Relation Age of Onset  . Heart attack Mother   . Heart attack Father   . Colon cancer Neg Hx     Social History Social History  Substance Use Topics  . Smoking status: Current Every Day Smoker    Packs/day: 1.00    Years:  30.00    Types: Cigarettes  . Smokeless tobacco: Never Used  . Alcohol use 0.0 oz/week     Comment: One drink every 6 months.     Allergies   Patient has no known allergies.   Review of Systems Review of Systems  Constitutional: Negative for appetite change and fatigue.  HENT: Negative for congestion, ear discharge and sinus pressure.   Eyes: Negative for discharge.  Respiratory: Negative for cough.   Cardiovascular: Positive for chest pain.  Gastrointestinal: Negative for abdominal pain and diarrhea.  Genitourinary: Negative for frequency and hematuria.  Musculoskeletal: Negative for back pain.  Skin: Negative for rash.  Neurological: Positive for light-headedness and numbness. Negative for seizures and headaches.  Psychiatric/Behavioral: Negative for hallucinations.  All other systems reviewed and are negative.    Physical Exam Updated Vital Signs BP (!) 154/102 (BP Location: Left Arm)   Pulse (!) 128   Temp 98.4 F (36.9 C) (Oral)   Resp (!) 128   Ht 6' (1.829 m)   Wt 160 lb (72.6 kg)   SpO2 98%   BMI 21.70 kg/m   Physical Exam  Constitutional: He is oriented to person, place, and time. He appears well-developed.  HENT:  Head: Normocephalic.  Eyes: Conjunctivae and EOM are normal. No scleral icterus.  Neck: Neck supple. No thyromegaly present.  Cardiovascular: Regular rhythm.  Exam reveals no gallop and no friction rub.   No murmur heard. Tachycardic.  Pulmonary/Chest: No stridor. He has no wheezes. He has no rales. He exhibits no tenderness.  Abdominal: He exhibits no distension. There is no tenderness. There is no rebound.  Musculoskeletal: Normal range of motion. He exhibits no edema.  Lymphadenopathy:    He has no cervical adenopathy.  Neurological: He is oriented to person, place, and time. He exhibits normal muscle tone. Coordination normal.  Skin: No rash noted. No erythema.  Psychiatric: He has a normal mood and affect. His behavior is normal.      ED Treatments / Results  DIAGNOSTIC STUDIES: Oxygen Saturation is 98% on RA, normal by my interpretation.    COORDINATION OF CARE: 9:35 PM Discussed treatment plan with pt at bedside and pt agreed to plan.  Labs (all labs ordered are listed, but only abnormal results are displayed) Labs Reviewed  CBC - Abnormal; Notable for the following:       Result Value   WBC 12.5 (*)    All other components within normal limits  BASIC METABOLIC PANEL  HEPATIC FUNCTION PANEL  I-STAT TROPOININ, ED    EKG  EKG Interpretation  Date/Time:  Friday October 12 2016 21:21:21 EST Ventricular Rate:  124 PR Interval:    QRS Duration: 93 QT Interval:  313 QTC Calculation: 450 R Axis:   85 Text Interpretation:  Sinus tachycardia Consider right atrial enlargement ST depr, consider ischemia, inferior leads Confirmed by Claire Bridge  MD, Broadus John [phone]) on 10/12/2016 9:25:31 PM       Radiology No results found.  Procedures Procedures (including critical care time)  Medications Ordered in ED Medications - No data to display   Initial Impression / Assessment and Plan / ED Course  I have reviewed the triage vital signs and the nursing notes.  Pertinent labs & imaging results that were available during my care of the patient were reviewed by me and considered in my medical decision making (see chart for details).  Clinical Course    Patient with chest pain relieved by 2 nitroglycerin. He will be admitted to medicine for rule out  Final Clinical Impressions(s) / ED Diagnoses   Final diagnoses:  None    New Prescriptions New Prescriptions   No medications on file     The chart was scribed for me under my direct supervision.  I personally performed the history, physical, and medical decision making and all procedures in the evaluation of this patient.Bradley Ferguson, MD 10/12/16 [phone]

## 2016-10-12 NOTE — ED Provider Notes (Signed)
[facility] DEPT Provider Note   CSN: [id number] Arrival date & time: 10/12/16  2113     History   Chief Complaint Chief Complaint  Patient presents with  . Chest Pain    HPI Bradley Goodman is a 61 y.o. male.  HPI  Past Medical History:  Diagnosis Date  . Complication of anesthesia   . GERD (gastroesophageal reflux disease)   . Headache(784.0)    history of migraines  . Hypercholesteremia   . Hypertension   . PONV (postoperative nausea and vomiting)     Patient Active Problem List   Diagnosis Date Noted  . History of colonic polyps   . History of adenomatous polyp of colon 09/14/2015  . GERD (gastroesophageal reflux disease) 06/24/2013  . Diabetes mellitus type 2 controlled 06/24/2013  . Hyperlipidemia 06/24/2013  . Hypertension 06/24/2013    Past Surgical History:  Procedure Laterality Date  . bilateral inguinal hernia repair    . COLONOSCOPY  01/23/2012   Procedure: COLONOSCOPY;  Surgeon: Daneil Dolin, MD;  [facility];  Service: Endoscopy;  Laterality: N/A;  9:30 AM  . COLONOSCOPY N/A 10/11/2015   Procedure: COLONOSCOPY;  Surgeon: Daneil Dolin, MD;  Location: [facility];  Service: Endoscopy;  Laterality: N/A;  830       Home Medications    Prior to Admission medications   Medication Sig Start Date End Date Taking? Authorizing Provider  aspirin EC 81 MG tablet Take 81 mg by mouth every evening.   Yes Historical Provider, MD  cholecalciferol (VITAMIN D) 1000 UNITS tablet Take 2,000 Units by mouth every evening. Pt takes two tablets daily   Yes Historical Provider, MD  Krill Oil 300 MG CAPS Take 1 capsule by mouth every evening.   Yes Historical Provider, MD  loratadine (CLARITIN) 10 MG tablet Take 10 mg by mouth every evening.    Yes Historical Provider, MD  pantoprazole (PROTONIX) 40 MG tablet Take 1 tablet (40 mg total) by mouth daily. 06/12/16  Yes Chipper Herb, MD  rosuvastatin (CRESTOR) 20 MG tablet TAKE 1 TABLET BY MOUTH DAILY  03/14/16  Yes Chipper Herb, MD  cephALEXin (KEFLEX) 500 MG capsule Take 1 capsule (500 mg total) by mouth 3 (three) times daily. Patient not taking: Reported on 10/12/2016 06/12/16   Chipper Herb, MD  sulfamethoxazole-trimethoprim (BACTRIM DS) 800-160 MG tablet Take 1 tablet by mouth 2 (two) times daily. Patient not taking: Reported on 10/12/2016 07/18/16   Chipper Herb, MD    Family History Family History  Problem Relation Age of Onset  . Heart attack Mother   . Heart attack Father   . Colon cancer Neg Hx     Social History Social History  Substance Use Topics  . Smoking status: Current Every Day Smoker    Packs/day: 1.00    Years: 30.00    Types: Cigarettes  . Smokeless tobacco: Never Used  . Alcohol use 0.0 oz/week     Comment: One drink every 6 months.     Allergies   Patient has no known allergies.   Review of Systems Review of Systems   Physical Exam Updated Vital Signs BP 112/87   Pulse 98   Temp 98.4 F (36.9 C) (Oral)   Resp 17   Ht 6' (1.829 m)   Wt 160 lb (72.6 kg)   SpO2 95%   BMI 21.70 kg/m   Physical Exam   ED Treatments / Results  Labs (all labs ordered  are listed, but only abnormal results are displayed) Labs Reviewed  CBC - Abnormal; Notable for the following:       Result Value   WBC 12.5 (*)    All other components within normal limits  BASIC METABOLIC PANEL  HEPATIC FUNCTION PANEL  D-DIMER, QUANTITATIVE (NOT AT [facility])  Randolm Idol, ED  I-STAT TROPOININ, ED    EKG  EKG Interpretation  Date/Time:  Friday October 12 2016 21:21:21 EST Ventricular Rate:  124 PR Interval:    QRS Duration: 93 QT Interval:  313 QTC Calculation: 450 R Axis:   85 Text Interpretation:  Sinus tachycardia Consider right atrial enlargement ST depr, consider ischemia, inferior leads Confirmed by Mitchelle Goerner  MD, Philip Eckersley [phone]) on 10/12/2016 9:25:31 PM       Radiology Dg Chest 2 View  Result Date: 10/12/2016 CLINICAL DATA:  Intermittent chest pain  EXAM: CHEST  2 VIEW COMPARISON:  08/29/2015, 10/27/2013 FINDINGS: Mild hyperinflation. No acute infiltrate or effusion. Normal cardiomediastinal silhouette. No pneumothorax. Nodules noted on CT are not well appreciated radiographically. IMPRESSION: No radiographic evidence for acute cardiopulmonary abnormality Electronically Signed   By: Donavan Foil M.D.   On: 10/12/2016 22:04    Procedures Procedures (including critical care time)  Medications Ordered in ED Medications  LORazepam (ATIVAN) injection 1 mg (1 mg Intravenous Given 10/12/16 2209)  nitroGLYCERIN (NITROSTAT) SL tablet 0.4 mg (0.4 mg Sublingual Given 10/12/16 2248)  nitroGLYCERIN (NITROSTAT) SL tablet 0.4 mg (0.4 mg Sublingual Given 10/12/16 2318)     Initial Impression / Assessment and Plan / ED Course  I have reviewed the triage vital signs and the nursing notes.  Pertinent labs & imaging results that were available during my care of the patient were reviewed by me and considered in my medical decision making (see chart for details).  Clinical Course     Patient with chest pain and normal initial troponin. Pain relieved by to nitros. Patient will be admitted for chest pain rule out  Final Clinical Impressions(s) / ED Diagnoses   Final diagnoses:  None    New Prescriptions New Prescriptions   No medications on file     [name], MD 10/12/16 2348

## 2016-10-12 NOTE — ED Triage Notes (Signed)
Patient complaining of chest pain that started yesterday and is intermittent.  Feels tight in left side of chest at times. Left arm has been tingling.  Having some tightness in my throat and occasionally some shortness of breath.

## 2016-10-13 ENCOUNTER — Observation Stay (HOSPITAL_COMMUNITY): Payer: 59

## 2016-10-13 ENCOUNTER — Encounter (HOSPITAL_COMMUNITY): Payer: Self-pay | Admitting: *Deleted

## 2016-10-13 ENCOUNTER — Observation Stay (HOSPITAL_BASED_OUTPATIENT_CLINIC_OR_DEPARTMENT_OTHER): Payer: 59

## 2016-10-13 DIAGNOSIS — R079 Chest pain, unspecified: Secondary | ICD-10-CM | POA: Diagnosis not present

## 2016-10-13 DIAGNOSIS — E119 Type 2 diabetes mellitus without complications: Secondary | ICD-10-CM

## 2016-10-13 DIAGNOSIS — Z7982 Long term (current) use of aspirin: Secondary | ICD-10-CM | POA: Diagnosis not present

## 2016-10-13 DIAGNOSIS — I10 Essential (primary) hypertension: Secondary | ICD-10-CM | POA: Diagnosis not present

## 2016-10-13 DIAGNOSIS — R200 Anesthesia of skin: Secondary | ICD-10-CM | POA: Diagnosis not present

## 2016-10-13 DIAGNOSIS — R202 Paresthesia of skin: Secondary | ICD-10-CM | POA: Diagnosis not present

## 2016-10-13 DIAGNOSIS — Z79899 Other long term (current) drug therapy: Secondary | ICD-10-CM | POA: Diagnosis not present

## 2016-10-13 DIAGNOSIS — I208 Other forms of angina pectoris: Secondary | ICD-10-CM | POA: Diagnosis not present

## 2016-10-13 DIAGNOSIS — F17210 Nicotine dependence, cigarettes, uncomplicated: Secondary | ICD-10-CM | POA: Diagnosis not present

## 2016-10-13 DIAGNOSIS — R0789 Other chest pain: Secondary | ICD-10-CM

## 2016-10-13 LAB — TROPONIN I: Troponin I: <0.03 ng/mL (ref <0.03)

## 2016-10-13 LAB — ECHOCARDIOGRAM COMPLETE
Height: 72 in
Weight: 2438.4 oz

## 2016-10-13 IMAGING — CT CT NECK W/ CM
4 series · 14 of 33 positions shown, 17 images · IV contrast (iopamidol)
Comparison: None.

CLINICAL DATA: Tingling left arm

EXAM:
CT NECK WITH CONTRAST
TECHNIQUE: Multidetector CT imaging of the neck was performed using the
standard protocol following the bolus administration of intravenous
contrast.
CONTRAST:  75mL [H0] IOPAMIDOL ([H0]) INJECTION 61%

[Series 2: axial neck · axial · 0.53mm/px · z∈[+1500,+1682]mm · 5 of 137 slices shown, 7 images]
[im 23/137  soft-tissue]
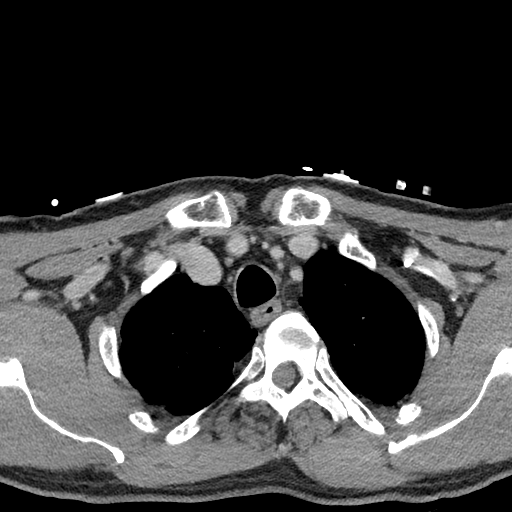
[im 23/137  bone]
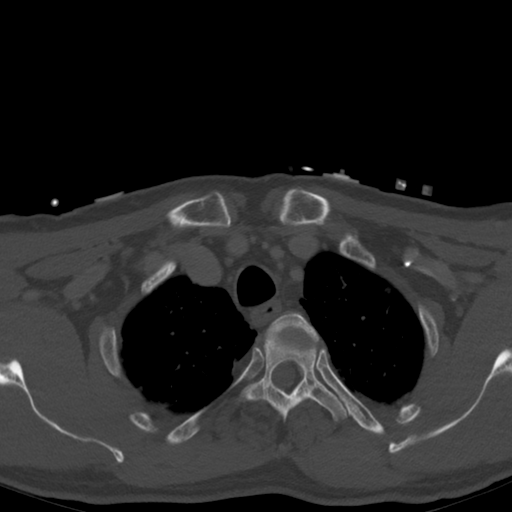
[im 46/137  bone]
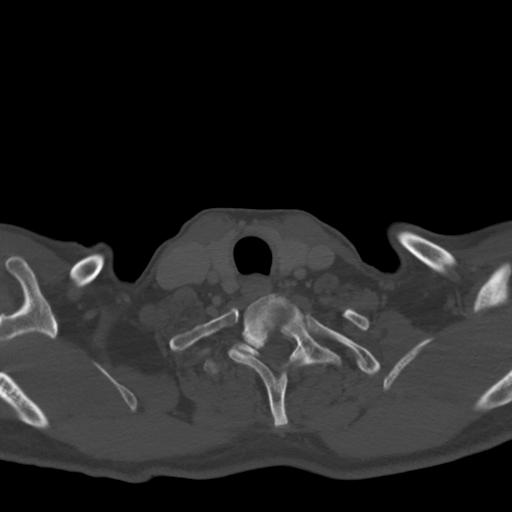
[im 69/137  bone]
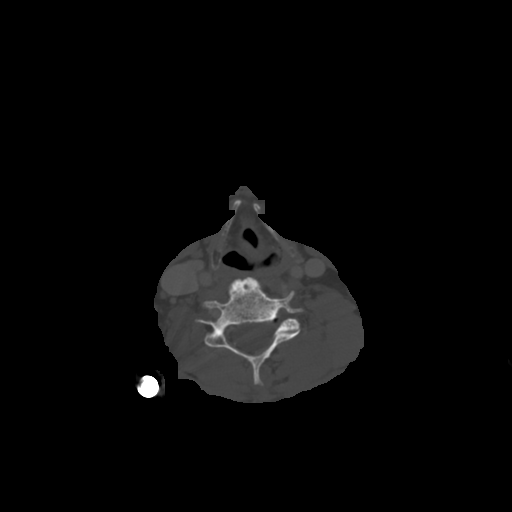
[im 91/137  bone]
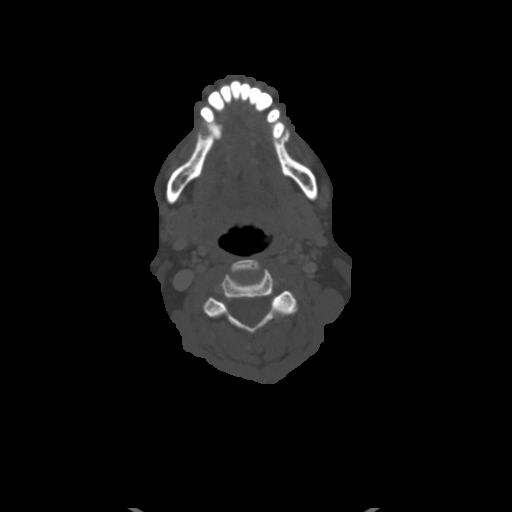
[im 114/137  soft-tissue]
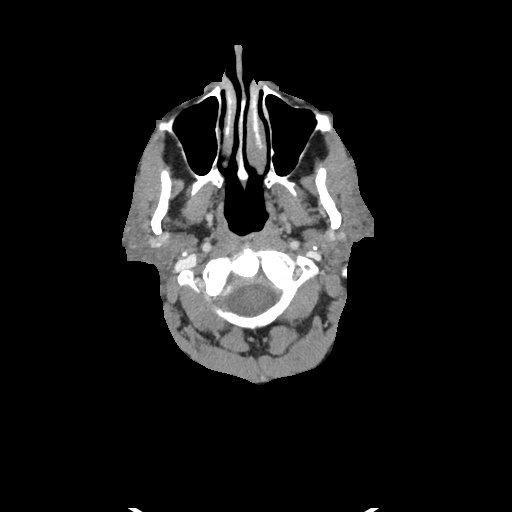
[im 114/137  bone]
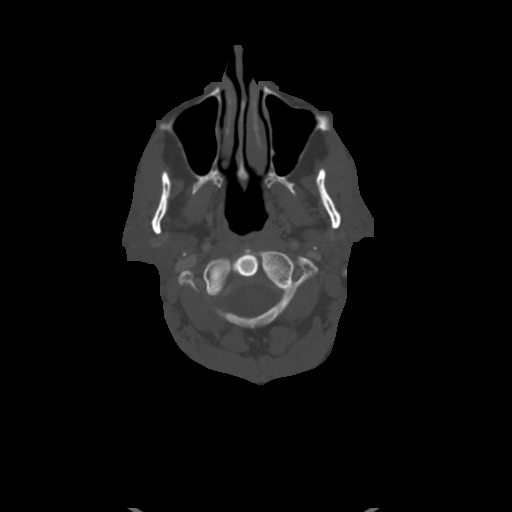

[Series 4: sag neck · sagittal · 0.54mm/px · 5 of 101 slices shown, 6 images]
[im 34/101  bone]
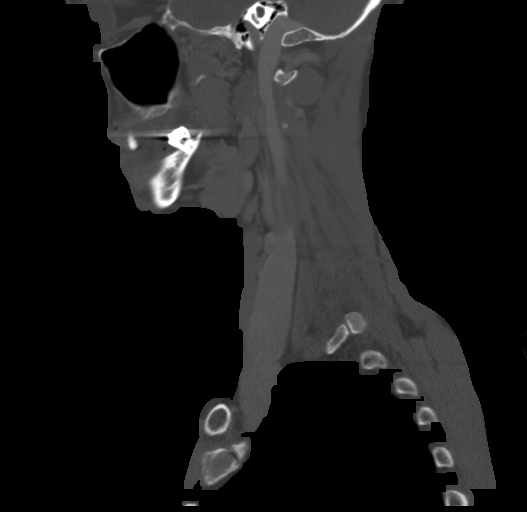
[im 42/101  bone]
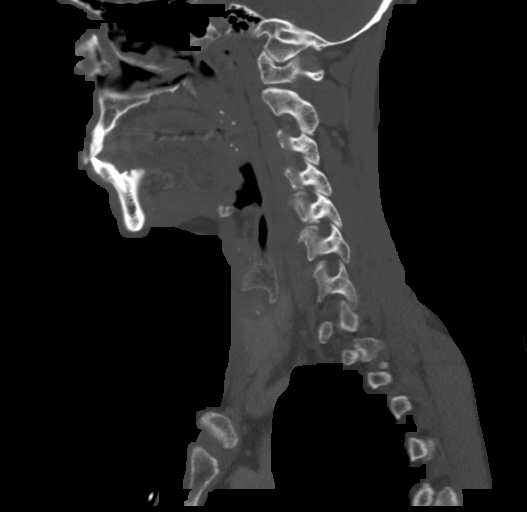
[im 51/101  soft-tissue]
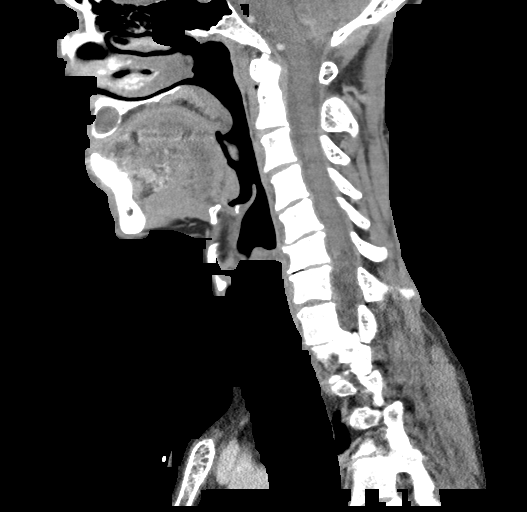
[im 51/101  bone]
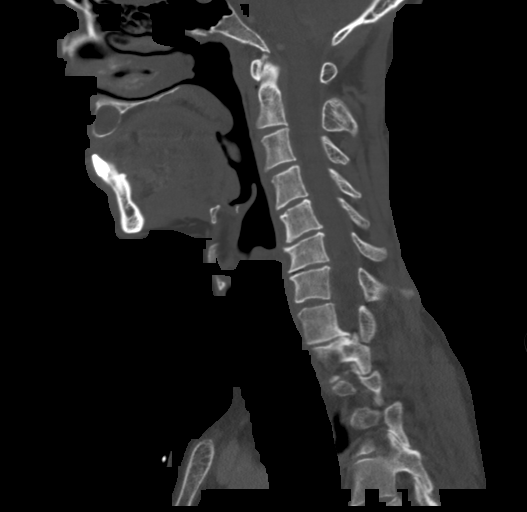
[im 59/101  bone]
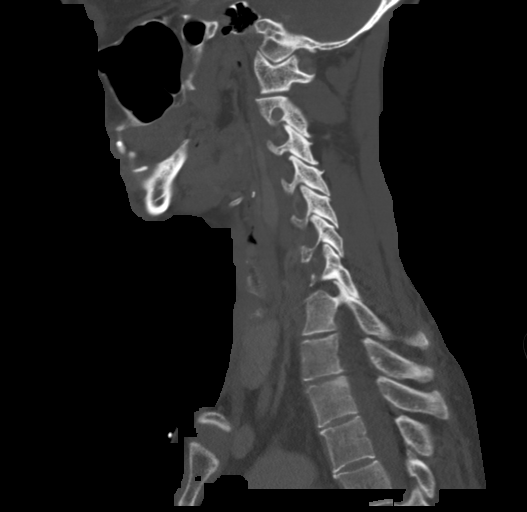
[im 67/101  bone]
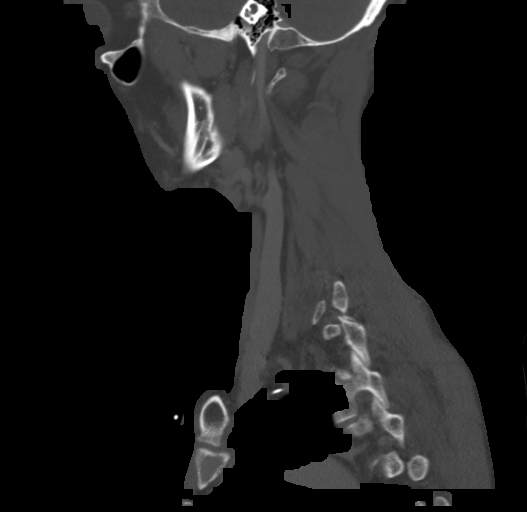

[Series 5: cor neck · coronal · 0.50mm/px · 3 of 129 slices shown]
[im 26/129  bone]
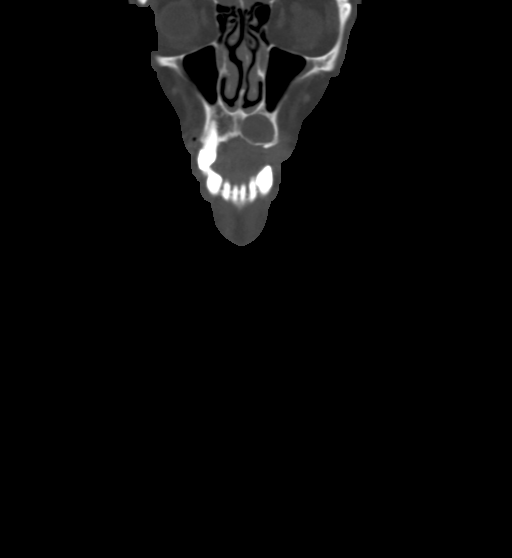
[im 52/129  bone]
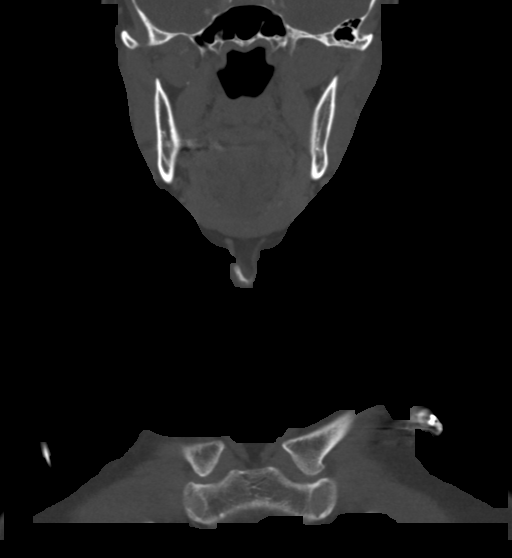
[im 77/129  bone]
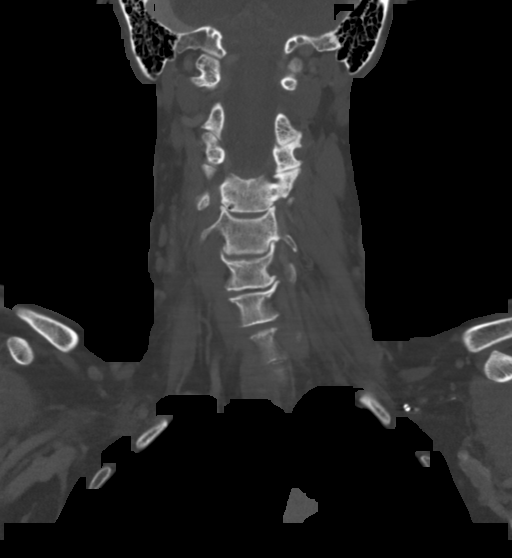

[Series 6: orthogonal ax · axial · 0.39mm/px · 1 of 137 slices shown]
[im 23/137  bone]
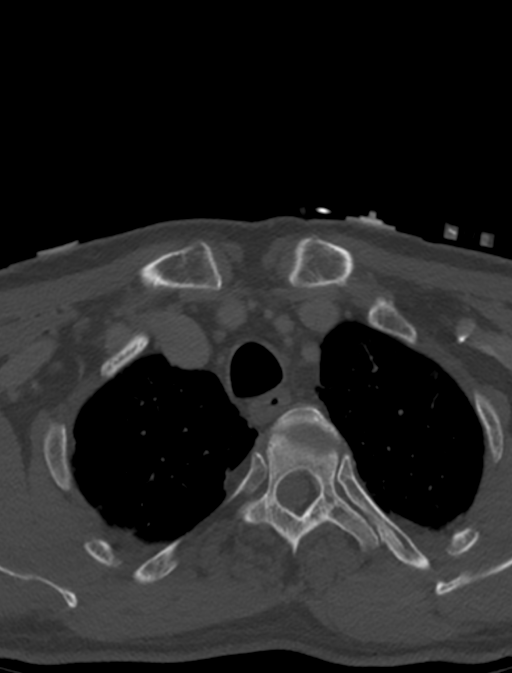

[14 of 33 positions shown; findings below may reference images not displayed]

FINDINGS: Pharynx and larynx: The nasopharynx normal. Prominent tonsils
bilaterally without mass lesion. Numerous tonsilliths bilaterally.
Epiglottis and larynx normal.

Salivary glands: No inflammation, mass, or stone.

Thyroid: Thyroid normal in size. 7 mm thyroid nodule left lower
pole. 3 mm right upper pole nodule.

Lymph nodes: No pathologic lymph nodes in the neck.

Vascular: Carotid artery and jugular vein patent bilaterally. No
significant atherosclerotic calcification

Limited intracranial: Negative

Visualized orbits: Negative

Mastoids and visualized paranasal sinuses: Negative

Skeleton: Cystic lesion in the left maxilla anteriorly measures 14 x
17 mm. These teeth have been removed. No aggressive bony
destruction. Thin calcified rim around the cyst is noted.

Thoracic levoscoliosis at T3. Cervical spine alignment is normal
with straightening of the cervical lordosis and mild kyphosis at
C5-6. Negative for fracture or acute bony abnormality. Multilevel
degenerative changes.

C2-3:  Negative

C3-4: Mild disc degeneration with early spurring. No significant
stenosis

C4-5: Mild disc degeneration. Uncinate spurring bilaterally with
mild foraminal narrowing bilaterally.

C5-6: Disc degeneration and spondylosis. Moderate left foraminal
narrowing and mild right foraminal narrowing

C6-7: Disc degeneration and spondylosis left greater than right.
Moderate left foraminal narrowing. No significant right foraminal
narrowing.

C7-T1:  Mild disc degeneration.

T1-2: Large right-sided osteophyte with marked right foraminal
encroachment.

T2-3: Large right-sided osteophyte with marked right foraminal
encroachment.

Upper chest: Small subpleural blebs and mild apical pleural
scarring.

Other: None
IMPRESSION: No mass or adenopathy in the neck. Prominent tonsils with bilateral
tonsilliths title chronic tonsillitis.

Small subcentimeter thyroid nodules bilaterally.

14 x 17 mm cyst in the left anterior maxilla which is probably a
dentigerous cyst which could be further evaluated with dental
x-rays.

Cervical spondylosis. Moderate left foraminal narrowing C5-6 and
C6-7. Marked right foraminal narrowing T1-2 and T2-3. Thoracic
levoscoliosis.

## 2016-10-13 MED ORDER — VITAMIN D 1000 UNITS PO TABS: 2000.0000 [IU] | ORAL_TABLET | Freq: Every evening | ORAL | Status: DC

## 2016-10-13 MED ORDER — SODIUM CHLORIDE 0.9 % IV SOLN: 250.0000 mL | INTRAVENOUS | Status: DC | PRN

## 2016-10-13 MED ORDER — ONDANSETRON HCL 4 MG PO TABS: 4.0000 mg | ORAL_TABLET | Freq: Four times a day (QID) | ORAL | Status: DC | PRN

## 2016-10-13 MED ORDER — ASPIRIN EC 81 MG PO TBEC: 81.0000 mg | DELAYED_RELEASE_TABLET | Freq: Every evening | ORAL | Status: DC

## 2016-10-13 MED ORDER — ROSUVASTATIN CALCIUM 20 MG PO TABS
20.0000 mg | ORAL_TABLET | Freq: Every day | ORAL | Status: DC
  Filled 2016-10-13: qty 1

## 2016-10-13 MED ORDER — SODIUM CHLORIDE 0.9% FLUSH: 3.0000 mL | INTRAVENOUS | Status: DC | PRN

## 2016-10-13 MED ORDER — OMEGA-3-ACID ETHYL ESTERS 1 G PO CAPS
1.0000 g | ORAL_CAPSULE | Freq: Every day | ORAL | Status: DC
  Filled 2016-10-13: qty 1

## 2016-10-13 MED ORDER — PANTOPRAZOLE SODIUM 40 MG PO TBEC
40.0000 mg | DELAYED_RELEASE_TABLET | Freq: Every day | ORAL | Status: DC
  Filled 2016-10-13: qty 1

## 2016-10-13 MED ORDER — SODIUM CHLORIDE 0.9% FLUSH: 3.0000 mL | Freq: Two times a day (BID) | INTRAVENOUS | Status: DC

## 2016-10-13 MED ORDER — KRILL OIL 300 MG PO CAPS: 1.0000 | ORAL_CAPSULE | Freq: Every evening | ORAL | Status: DC

## 2016-10-13 MED ORDER — ENOXAPARIN SODIUM 40 MG/0.4ML ~~LOC~~ SOLN: 40.0000 mg | SUBCUTANEOUS | Status: DC

## 2016-10-13 MED ORDER — ONDANSETRON HCL 4 MG/2ML IJ SOLN: 4.0000 mg | Freq: Four times a day (QID) | INTRAMUSCULAR | Status: DC | PRN

## 2016-10-13 MED ORDER — IOPAMIDOL (ISOVUE-300) INJECTION 61%
75.0000 mL | Freq: Once | INTRAVENOUS | Status: AC | PRN
  Administered 2016-10-13: 75 mL via INTRAVENOUS

## 2016-10-13 MED ORDER — MORPHINE SULFATE (PF) 2 MG/ML IV SOLN: 2.0000 mg | INTRAVENOUS | Status: DC | PRN

## 2016-10-13 NOTE — Progress Notes (Signed)
Reviewed discharge instructions, educated on all medications including purpose, actions, and side effects, reviewed next dose due, educated on s/s to report and follow up appointments, understanding voiced. Client to be discharged to home in stable condition with family.

## 2016-10-13 NOTE — H&P (Signed)
History and Physical    Bradley Goodman HDQ:[id number] DOB: 1956-01-02 DOA: 10/12/2016  PCP: Redge Gainer, MD  Patient coming from: Home.    Chief Complaint: chest pressure.   HPI: Bradley Goodman is an 61 y.o. male with hx of tobacco abuse, 1ppd, HTN, diet controlled DM, HLD on statin, with family hx of premature CAD, presented to the ER during his shift tonight as an [facility] with substernal chest pressure, some lightheadeness, and numbness of both arms, but no SOB, or diaphoresis.  He had similar episode yesterday, but it was shorter in duration.  He had 2 negative stress test previously, but the last one was about 4 years ago.  He did not have exertional symptoms, and his discomfort was non pleuritic.  Evaluation in the ER showed D dimer ngative, EKG did not show any ischemic changes, though he was tachycardic.  Initial troponin was negative, and his CXR was cleared.   He is now pain free, and hospitalist was asked to admit him for atypical chest pain r/out.    ED Course:  See above.  Rewiew of Systems:  Constitutional: Negative for malaise, fever and chills. No significant weight loss or weight gain Eyes: Negative for eye pain, redness and discharge, diplopia, visual changes, or flashes of light. ENMT: Negative for ear pain, hoarseness, nasal congestion, sinus pressure and sore throat. No headaches; tinnitus, drooling, or problem swallowing. Cardiovascular: Negative for  palpitations, diaphoresis, dyspnea and peripheral edema. ; No orthopnea, PND Respiratory: Negative for cough, hemoptysis, wheezing and stridor. No pleuritic chestpain. Gastrointestinal: Negative for diarrhea, constipation,  melena, blood in stool, hematemesis, jaundice and rectal bleeding.    Genitourinary: Negative for frequency, dysuria, incontinence,flank pain and hematuria; Musculoskeletal: Negative for back pain and neck pain. Negative for swelling and trauma.;  Skin: . Negative for pruritus, rash, abrasions, bruising and  skin lesion.; ulcerations Neuro: Negative for headache, lightheadedness and neck stiffness. Negative for weakness, altered level of consciousness , altered mental status, extremity weakness, burning feet, involuntary movement, seizure and syncope.  Psych: negative for anxiety, depression, insomnia, tearfulness, panic attacks, hallucinations, paranoia, suicidal or homicidal ideation    Past Medical History:  Diagnosis Date  . Complication of anesthesia   . GERD (gastroesophageal reflux disease)   . Headache(784.0)    history of migraines  . Hypercholesteremia   . Hypertension   . PONV (postoperative nausea and vomiting)     Past Surgical History:  Procedure Laterality Date  . bilateral inguinal hernia repair    . COLONOSCOPY  01/23/2012   Procedure: COLONOSCOPY;  Surgeon: Daneil Dolin, MD;  [facility]: [facility];  Service: Endoscopy;  Laterality: N/A;  9:30 AM  . COLONOSCOPY N/A 10/11/2015   Procedure: COLONOSCOPY;  Surgeon: Daneil Dolin, MD;  Location: [facility];  Service: Endoscopy;  Laterality: N/A;  830     reports that he has been smoking Cigarettes.  He has a 30.00 pack-year smoking history. He has never used smokeless tobacco. He reports that he drinks alcohol. He reports that he does not use drugs.  No Known Allergies  Family History  Problem Relation Age of Onset  . Heart attack Mother   . Heart attack Father   . Colon cancer Neg Hx      Prior to Admission medications   Medication Sig Start Date End Date Taking? Authorizing Provider  aspirin EC 81 MG tablet Take 81 mg by mouth every evening.   Yes Historical Provider, MD  cholecalciferol (VITAMIN D) 1000  UNITS tablet Take 2,000 Units by mouth every evening. Pt takes two tablets daily   Yes Historical Provider, MD  Krill Oil 300 MG CAPS Take 1 capsule by mouth every evening.   Yes Historical Provider, MD  loratadine (CLARITIN) 10 MG tablet Take 10 mg by mouth every evening.    Yes Historical Provider, MD    pantoprazole (PROTONIX) 40 MG tablet Take 1 tablet (40 mg total) by mouth daily. 06/12/16  Yes Chipper Herb, MD  rosuvastatin (CRESTOR) 20 MG tablet TAKE 1 TABLET BY MOUTH DAILY 03/14/16  Yes Chipper Herb, MD  cephALEXin (KEFLEX) 500 MG capsule Take 1 capsule (500 mg total) by mouth 3 (three) times daily. Patient not taking: Reported on 10/12/2016 06/12/16   Chipper Herb, MD  sulfamethoxazole-trimethoprim (BACTRIM DS) 800-160 MG tablet Take 1 tablet by mouth 2 (two) times daily. Patient not taking: Reported on 10/12/2016 07/18/16   Chipper Herb, MD    Physical Exam: Vitals:   10/12/16 2230 10/12/16 2300 10/12/16 2316 10/12/16 2330  BP: 131/94 112/87 112/87 115/86  Pulse: 101 102 98 100  Resp: '20 14 17 19  '$ Temp:      TempSrc:      SpO2: 95% 95% 95% 96%  Weight:      Height:          Constitutional: NAD, calm, comfortable Vitals:   10/12/16 2230 10/12/16 2300 10/12/16 2316 10/12/16 2330  BP: 131/94 112/87 112/87 115/86  Pulse: 101 102 98 100  Resp: '20 14 17 19  '$ Temp:      TempSrc:      SpO2: 95% 95% 95% 96%  Weight:      Height:       Eyes: PERRL, lids and conjunctivae normal ENMT: Mucous membranes are moist. Posterior pharynx clear of any exudate or lesions.Normal dentition.  Neck: normal, supple, no masses, no thyromegaly Respiratory: clear to auscultation bilaterally, no wheezing, no crackles. Normal respiratory effort. No accessory muscle use.  Cardiovascular: Regular rate and rhythm, no murmurs / rubs / gallops. No extremity edema. 2+ pedal pulses. No carotid bruits.  Abdomen: no tenderness, no masses palpated. No hepatosplenomegaly. Bowel sounds positive.  Musculoskeletal: no clubbing / cyanosis. No joint deformity upper and lower extremities. Good ROM, no contractures. Normal muscle tone.  Skin: no rashes, lesions, ulcers. No induration Neurologic: CN 2-12 grossly intact. Sensation intact, DTR normal. Strength 5/5 in all 4.  Psychiatric: Normal judgment and insight.  Alert and oriented x 3. Normal mood.    Labs on Admission: I have personally reviewed following labs and imaging studies  CBC:  Recent Labs Lab 10/12/16 2124  WBC 12.5*  HGB 16.5  HCT 49.8  MCV 90.2  PLT 169   Basic Metabolic Panel:  Recent Labs Lab 10/12/16 2124  NA 138  K 3.8  CL 103  CO2 29  GLUCOSE 97  BUN 15  CREATININE 1.09  CALCIUM 9.4   GFR: Estimated Creatinine Clearance: 74.9 mL/min (by C-G formula based on SCr of 1.09 mg/dL). Liver Function Tests:  Recent Labs Lab 10/12/16 2124  AST 19  ALT 23  ALKPHOS 63  BILITOT 0.4  PROT 6.7  ALBUMIN 4.1   Urine analysis:    Component Value Date/Time   BILIRUBINUR neg 07/13/2014 1059   PROTEINUR neg 07/13/2014 1059   UROBILINOGEN negative 07/13/2014 1059   NITRITE neg 07/13/2014 1059   LEUKOCYTESUR Negative 07/13/2014 1059   Radiological Exams on Admission: Dg Chest 2 View  Result Date: 10/12/2016  CLINICAL DATA:  Intermittent chest pain EXAM: CHEST  2 VIEW COMPARISON:  08/29/2015, 10/27/2013 FINDINGS: Mild hyperinflation. No acute infiltrate or effusion. Normal cardiomediastinal silhouette. No pneumothorax. Nodules noted on CT are not well appreciated radiographically. IMPRESSION: No radiographic evidence for acute cardiopulmonary abnormality Electronically Signed   By: Donavan Foil M.D.   On: 10/12/2016 22:04    EKG: Independently reviewed.  Assessment/Plan Principal Problem:   Atypical chest pain Active Problems:   GERD (gastroesophageal reflux disease)   Diabetes mellitus type 2 controlled   Hyperlipidemia   Hypertension    PLAN:   Atypical chest pain:  Will continue with ASA and statin.  Will cycle his troponins, and obtained ECHO.  His pain did not suggest ACS.  GERD:  Continue with PPI.  HLD:  Continue with Crestor.  DM:  Carb modified diet.  SSI if required.  Tobacco abuse:  Advised stop.    DVT prophylaxis: Lovenox SQ.  Code Status: FULL CODE.  Family Communication: None.    Disposition Plan: to home once r/out.  Consults called: None.  Admission status: OBS.    Therisa Mennella MD FACP. [facility]  If 7PM-7AM, please contact night-coverage [email or web address] Password [email or web address]  10/13/2016, 12:02 AM

## 2016-10-13 NOTE — Discharge Summary (Signed)
Physician Discharge Summary  Bradley Goodman EGB:[id number] DOB: Apr 12, 1956 DOA: 10/12/2016  PCP: Redge Gainer, MD  Admit date: 10/12/2016 Discharge date: 10/12/2016  Admitted From: Home Disposition:  HOme.   Recommendations for Outpatient Follow-up:  1. Follow up with PCP in 1-2 weeks 2. Please obtain BMP/CBC in one week     Discharge Condition:stable.  CODE STATUS:full code.  Diet recommendation: Heart Healthy   Brief/Interim Summary: Bradley Goodman is an 61 y.o. male with hx of tobacco abuse, 1ppd, HTN, diet controlled DM, HLD on statin, with family hx of premature CAD, presented to the ER during his shift tonight as an [facility] with substernal chest pressure, some lightheadeness, and numbness of both arms, but no SOB, or diaphoresis.  Discharge Diagnoses:  Principal Problem:   Atypical chest pain Active Problems:   GERD (gastroesophageal reflux disease)   Diabetes mellitus type 2 controlled   Hyperlipidemia   Hypertension  Atypical chest pain: possibly musculoskeletal.  ACS ruled out, echo unremarkable.  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    Diabetes mellitus: CBG (last 3)  No results for input(s): GLUCAP in the last 72 hours.  Resume home meds.    Hyperlipidemia:  Resume crestor.   Discharge Instructions  Discharge Instructions    Discharge instructions    Complete by:  As directed    Please follow up with Primary Care Physician in one week, and possible referral to cardiology for Nuclear stress test.     Allergies as of 10/13/2016   No Known Allergies     Medication List    STOP taking these medications   cephALEXin 500 MG capsule Commonly known as:  KEFLEX   sulfamethoxazole-trimethoprim 800-160 MG tablet Commonly known as:  BACTRIM DS     TAKE these medications   aspirin EC 81 MG tablet Take 81 mg by mouth every evening.   cholecalciferol 1000 units tablet Commonly known as:   VITAMIN D Take 2,000 Units by mouth every evening. Pt takes two tablets daily   Krill Oil 300 MG Caps Take 1 capsule by mouth every evening.   loratadine 10 MG tablet Commonly known as:  CLARITIN Take 10 mg by mouth every evening.   pantoprazole 40 MG tablet Commonly known as:  PROTONIX Take 1 tablet (40 mg total) by mouth daily.   rosuvastatin 20 MG tablet Commonly known as:  CRESTOR TAKE 1 TABLET BY MOUTH DAILY      Follow-up Information    Redge Gainer, MD. Schedule an appointment as soon as possible for a visit in 1 week(s).   Specialty:  Family Medicine Contact information: [place] [address] [phone]          No Known Allergies  Consultations: none  Procedures/Studies: Dg Chest 2 View  Result Date: 10/12/2016 CLINICAL DATA:  Intermittent chest pain EXAM: CHEST  2 VIEW COMPARISON:  08/29/2015, 10/27/2013 FINDINGS: Mild hyperinflation. No acute infiltrate or effusion. Normal cardiomediastinal silhouette. No pneumothorax. Nodules noted on CT are not well appreciated radiographically. IMPRESSION: No radiographic evidence for acute cardiopulmonary abnormality Electronically Signed   By: Donavan Foil M.D.   On: 10/12/2016 22:04   Ct Soft Tissue Neck W Contrast  Result Date: 10/13/2016 CLINICAL DATA:  Tingling left arm EXAM: CT NECK WITH CONTRAST TECHNIQUE: Multidetector CT imaging of the neck was performed using the standard protocol following the bolus administration of intravenous contrast. CONTRAST:  49m ISOVUE-300 IOPAMIDOL (ISOVUE-300) INJECTION  61% COMPARISON:  None. FINDINGS: Pharynx and larynx: The nasopharynx normal. Prominent tonsils bilaterally without mass lesion. Numerous tonsilliths bilaterally. Epiglottis and larynx normal. Salivary glands: No inflammation, mass, or stone. Thyroid: Thyroid normal in size. 7 mm thyroid nodule left lower pole. 3 mm right upper pole nodule. Lymph nodes: No pathologic lymph nodes in the neck. Vascular:  Carotid artery and jugular vein patent bilaterally. No significant atherosclerotic calcification Limited intracranial: Negative Visualized orbits: Negative Mastoids and visualized paranasal sinuses: Negative Skeleton: Cystic lesion in the left maxilla anteriorly measures 14 x 17 mm. These teeth have been removed. No aggressive bony destruction. Thin calcified rim around the cyst is noted. Thoracic levoscoliosis at T3. Cervical spine alignment is normal with straightening of the cervical lordosis and mild kyphosis at C5-6. Negative for fracture or acute bony abnormality. Multilevel degenerative changes. C2-3:  Negative C3-4: Mild disc degeneration with early spurring. No significant stenosis C4-5: Mild disc degeneration. Uncinate spurring bilaterally with mild foraminal narrowing bilaterally. C5-6: Disc degeneration and spondylosis. Moderate left foraminal narrowing and mild right foraminal narrowing C6-7: Disc degeneration and spondylosis left greater than right. Moderate left foraminal narrowing. No significant right foraminal narrowing. C7-T1:  Mild disc degeneration. T1-2: Large right-sided osteophyte with marked right foraminal encroachment. T2-3: Large right-sided osteophyte with marked right foraminal encroachment. Upper chest: Small subpleural blebs and mild apical pleural scarring. Other: None IMPRESSION: No mass or adenopathy in the neck. Prominent tonsils with bilateral tonsilliths title chronic tonsillitis. Small subcentimeter thyroid nodules bilaterally. 14 x 17 mm cyst in the left anterior maxilla which is probably a dentigerous cyst which could be further evaluated with dental x-rays. Cervical spondylosis. Moderate left foraminal narrowing C5-6 and C6-7. Marked right foraminal narrowing T1-2 and T2-3. Thoracic levoscoliosis. Electronically Signed   By: Franchot Gallo M.D.   On: 10/13/2016 13:08       Subjective: No new complaints.   Discharge Exam: Vitals:   10/13/16 0127 10/13/16 0609  BP:  97/78 113/84  Pulse: 90 84  Resp: 20 17  Temp: 98.7 F (37.1 C) 98.3 F (36.8 C)   Vitals:   10/13/16 0000 10/13/16 0030 10/13/16 0127 10/13/16 0609  BP: 1'08/86 95/69 97/78 '$ 113/84  Pulse: 94 85 90 84  Resp: '15 16 20 17  '$ Temp:   98.7 F (37.1 C) 98.3 F (36.8 C)  TempSrc:   Oral Oral  SpO2: 96% 95% 97% 97%  Weight:   69.1 kg (152 lb 6.4 oz)   Height:        General: Pt is alert, awake, not in acute distress Cardiovascular: RRR, S1/S2 +, no rubs, no gallops Respiratory: CTA bilaterally, no wheezing, no rhonchi Abdominal: Soft, NT, ND, bowel sounds + Extremities: no edema, no cyanosis    The results of significant diagnostics from this hospitalization (including imaging, microbiology, ancillary and laboratory) are listed below for reference.     Microbiology: No results found for this or any previous visit (from the past 240 hour(s)).   Labs: BNP (last 3 results) No results for input(s): BNP in the last 8760 hours. Basic Metabolic Panel:  Recent Labs Lab 10/12/16 2124  NA 138  K 3.8  CL 103  CO2 29  GLUCOSE 97  BUN 15  CREATININE 1.09  CALCIUM 9.4   Liver Function Tests:  Recent Labs Lab 10/12/16 2124  AST 19  ALT 23  ALKPHOS 63  BILITOT 0.4  PROT 6.7  ALBUMIN 4.1   No results for input(s): LIPASE, AMYLASE in the last 168 hours.  No results for input(s): AMMONIA in the last 168 hours. CBC:  Recent Labs Lab 10/12/16 2124  WBC 12.5*  HGB 16.5  HCT 49.8  MCV 90.2  PLT 216   Cardiac Enzymes:  Recent Labs Lab 10/13/16 0154 10/13/16 0636 10/13/16 1237  TROPONINI <0.03 <0.03 <0.03   BNP: Invalid input(s): POCBNP CBG: No results for input(s): GLUCAP in the last 168 hours. D-Dimer  Recent Labs  10/12/16 2124  DDIMER 0.32   Hgb A1c No results for input(s): HGBA1C in the last 72 hours. Lipid Profile No results for input(s): CHOL, HDL, LDLCALC, TRIG, CHOLHDL, LDLDIRECT in the last 72 hours. Thyroid function studies No results for  input(s): TSH, T4TOTAL, T3FREE, THYROIDAB in the last 72 hours.  Invalid input(s): FREET3 Anemia work up No results for input(s): VITAMINB12, FOLATE, FERRITIN, TIBC, IRON, RETICCTPCT in the last 72 hours. Urinalysis    Component Value Date/Time   BILIRUBINUR neg 07/13/2014 1059   PROTEINUR neg 07/13/2014 1059   UROBILINOGEN negative 07/13/2014 1059   NITRITE neg 07/13/2014 1059   LEUKOCYTESUR Negative 07/13/2014 1059   Sepsis Labs Invalid input(s): PROCALCITONIN,  WBC,  LACTICIDVEN Microbiology No results found for this or any previous visit (from the past 240 hour(s)).   Time coordinating discharge: Over 30 minutes  SIGNED:   Hosie Poisson, MD  [facility] 10/13/2016, 3:07 PM Pager   If 7PM-7AM, please contact night-coverage [email or web address] Password [email or web address]

## 2016-10-13 NOTE — Progress Notes (Signed)
*  PRELIMINARY RESULTS* Echocardiogram 2D Echocardiogram has been performed.  Bradley Goodman 10/13/2016, 9:32 AM

## 2016-10-18 ENCOUNTER — Encounter: Payer: Self-pay | Admitting: Family Medicine

## 2016-10-18 ENCOUNTER — Ambulatory Visit (INDEPENDENT_AMBULATORY_CARE_PROVIDER_SITE_OTHER): Payer: 59 | Admitting: Family Medicine

## 2016-10-18 VITALS — BP 136/88 | HR 105 | Temp 98.0°F | Ht 72.0 in | Wt 154.0 lb

## 2016-10-18 DIAGNOSIS — E7800 Pure hypercholesterolemia, unspecified: Secondary | ICD-10-CM

## 2016-10-18 DIAGNOSIS — J029 Acute pharyngitis, unspecified: Secondary | ICD-10-CM

## 2016-10-18 DIAGNOSIS — R937 Abnormal findings on diagnostic imaging of other parts of musculoskeletal system: Secondary | ICD-10-CM

## 2016-10-18 DIAGNOSIS — M47812 Spondylosis without myelopathy or radiculopathy, cervical region: Secondary | ICD-10-CM

## 2016-10-18 DIAGNOSIS — M542 Cervicalgia: Secondary | ICD-10-CM

## 2016-10-18 DIAGNOSIS — Z8249 Family history of ischemic heart disease and other diseases of the circulatory system: Secondary | ICD-10-CM

## 2016-10-18 DIAGNOSIS — E119 Type 2 diabetes mellitus without complications: Secondary | ICD-10-CM | POA: Diagnosis not present

## 2016-10-18 DIAGNOSIS — Z0000 Encounter for general adult medical examination without abnormal findings: Secondary | ICD-10-CM

## 2016-10-18 DIAGNOSIS — E559 Vitamin D deficiency, unspecified: Secondary | ICD-10-CM

## 2016-10-18 DIAGNOSIS — Z720 Tobacco use: Secondary | ICD-10-CM

## 2016-10-18 DIAGNOSIS — N400 Enlarged prostate without lower urinary tract symptoms: Secondary | ICD-10-CM

## 2016-10-18 DIAGNOSIS — I10 Essential (primary) hypertension: Secondary | ICD-10-CM

## 2016-10-18 DIAGNOSIS — R0789 Other chest pain: Secondary | ICD-10-CM

## 2016-10-18 DIAGNOSIS — K219 Gastro-esophageal reflux disease without esophagitis: Secondary | ICD-10-CM

## 2016-10-18 LAB — URINALYSIS, COMPLETE
BILIRUBIN UA: NEGATIVE
GLUCOSE, UA: NEGATIVE
LEUKOCYTES UA: NEGATIVE
Nitrite, UA: NEGATIVE
PH UA: 6 (ref 5.0–7.5)
PROTEIN UA: NEGATIVE
RBC, UA: NEGATIVE
Specific Gravity, UA: 1.02 (ref 1.005–1.030)
UUROB: 0.2 mg/dL (ref 0.2–1.0)

## 2016-10-18 LAB — BAYER DCA HB A1C WAIVED: HB A1C: 5.6 % (ref <7.0)

## 2016-10-18 LAB — MICROSCOPIC EXAMINATION
EPITHELIAL CELLS (NON RENAL): NONE SEEN /HPF (ref 0–10)
RENAL EPITHEL UA: NONE SEEN /HPF
WBC, UA: NONE SEEN /hpf (ref 0–?)

## 2016-10-18 LAB — RAPID STREP SCREEN (MED CTR MEBANE ONLY): Strep Gp A Ag, IA W/Reflex: NEGATIVE

## 2016-10-18 LAB — CULTURE, GROUP A STREP

## 2016-10-18 NOTE — Patient Instructions (Addendum)
Continue current medications. Continue good therapeutic lifestyle changes which include good diet and exercise. Fall precautions discussed with patient. If an FOBT was given today- please return it to our front desk. If you are over 60 years old - you may need Prevnar 12 or the adult Pneumonia vaccine.  **Flu shots are available--- please call and schedule a FLU-CLINIC appointment**  After your visit with [place] today you will receive a survey in the mail or online from [employer] regarding your care with [place]. Please take a moment to fill this out. Your feedback is very important to [place] as you can help [place] better understand your patient needs as well as improve your experience and satisfaction. WE CARE ABOUT YOU!!!   We will arrange for you to have a visit with the cardiologist because of the recent visit and positive family history for heart disease to the emergency room with chest pressure Please make every effort to try to stop smoking We will get a rapid strep test today and if that is positive treat you for strep since you were exposed to your granddaughter who did have strep We will give you a Prevnar vaccine today and give you a Pneumovax in 1 year. Continue to drink plenty of fluids and stay active physically We will schedule you for an MRI because of the findings of the cervical spine and upper thoracic [place] with foraminal stenosis

## 2016-10-18 NOTE — Addendum Note (Signed)
Addended by: Zannie Cove on: 10/18/2016 03:15 PM   Modules accepted: Orders

## 2016-10-18 NOTE — Progress Notes (Signed)
Subjective:    Patient ID: Bradley Goodman, male    DOB: April 22, 1956, 61 y.o.   MRN: [id number]  HPI Patient is here today for annual wellness exam and follow up of chronic medical problems which includes diabetes, hyperlipidemia, and hypertension. He is taking medications regularly.This patient comes in today for his annual exam. 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 He recently went to the emergency room from his work location because of chest pressure and tightness. Troponins were negative and the workup was negative cardiac-wise. This patient comes from [place] and has [place] background. There is a strong family history of diabetes and heart disease. We'll make sure that he gets a referral to the cardiologist for further workup and evaluation of this recent ER visit. He complains today of a scratchy throat. His initial blood pressure was elevated but on repeat was down to 136/88. He was in the hospital on January 5 of this year. The patient thinks that the chest pain may have been musculoskeletal in nature. He does have a strong family history of heart disease and that his brother has had a heart attack his mother died of a heart attack and his father had a heart attack in his early 64s but didn't die until his age was 80 years later. This patient has diabetes and risk factors for heart disease plus family history. He denies any further severe discomfort or pressure other than the soreness in his left pectoralis area. He denies any additional shortness of breath. He has no nausea vomiting diarrhea blood in the stool or black tarry bowel movements. He did have a colonoscopy this past year and they're requiring another one in 3 years after that one because of polyp that was found that was adenomatous. He's passing his water without problems and has no erectile dysfunction. He has had an eye exam and this was done in the spring of 2017 at [facility]. His workplace is allowing him  to get the Prevnar and Pneumovax. He has not had either. He has not had a stone shingles shot yet either and this is improved from when he turns 33.    Patient Active Problem List   Diagnosis Date Noted  . Atypical chest pain 10/12/2016  . History of colonic polyps   . History of adenomatous polyp of colon 09/14/2015  . GERD (gastroesophageal reflux disease) 06/24/2013  . Diabetes mellitus type 2 controlled 06/24/2013  . Hyperlipidemia 06/24/2013  . Hypertension 06/24/2013   Outpatient Encounter Prescriptions as of 10/18/2016  Medication Sig  . aspirin EC 81 MG tablet Take 81 mg by mouth every evening.  . cholecalciferol (VITAMIN D) 1000 UNITS tablet Take 2,000 Units by mouth every evening. Pt takes two tablets daily  . Krill Oil 300 MG CAPS Take 1 capsule by mouth every evening.  . loratadine (CLARITIN) 10 MG tablet Take 10 mg by mouth every evening.   . pantoprazole (PROTONIX) 40 MG tablet Take 1 tablet (40 mg total) by mouth daily.  . rosuvastatin (CRESTOR) 20 MG tablet TAKE 1 TABLET BY MOUTH DAILY   No facility-administered encounter medications on file as of 10/18/2016.       Review of Systems  Constitutional: Negative.   HENT: Positive for sore throat (scratchy throat).   Eyes: Negative.   Respiratory: Negative.   Cardiovascular: Negative.   Gastrointestinal: Negative.   Endocrine: Negative.   Genitourinary: Negative.   Musculoskeletal: Negative.   Skin: Negative.  Allergic/Immunologic: Negative.   Neurological: Negative.   Hematological: Negative.   Psychiatric/Behavioral: Negative.        Objective:   Physical Exam  Constitutional: He is oriented to person, place, and time. He appears well-developed and well-nourished.  HENT:  Head: Normocephalic and atraumatic.  Right Ear: External ear normal.  Left Ear: External ear normal.  Nose: Nose normal.  Mouth/Throat: Oropharynx is clear and moist. No oropharyngeal exudate.  Eyes: Conjunctivae and EOM are normal.  Pupils are equal, round, and reactive to light. Right eye exhibits no discharge. Left eye exhibits no discharge. No scleral icterus.  Neck: Normal range of motion. Neck supple. No thyromegaly present.  Cardiovascular: Normal rate, regular rhythm, normal heart sounds and intact distal pulses.   No murmur heard. He does drink lots of caffeine. I am asking to limit this. The patient had a sinus tachycardia at 10 8/m  Pulmonary/Chest: Effort normal and breath sounds normal. No respiratory distress. He has no wheezes. He has no rales. He exhibits no tenderness.  No axillary adenopathy and chest is clear anteriorly and posteriorly  Abdominal: Soft. Bowel sounds are normal. He exhibits no mass. There is no tenderness. There is no rebound and no guarding.  Genitourinary: Rectum normal and penis normal.  Genitourinary Comments: Prostate is enlarged but soft without masses or lumps. There are no rectal masses. The external genitalia were within normal limits without hernias being present.  Musculoskeletal: Normal range of motion. He exhibits no edema.  Lymphadenopathy:    He has no cervical adenopathy.  Neurological: He is alert and oriented to person, place, and time. He has normal reflexes. No cranial nerve deficit.  Skin: Skin is warm and dry. No rash noted.  Psychiatric: He has a normal mood and affect. His behavior is normal. Judgment and thought content normal.  Nursing note and vitals reviewed.  BP 136/88   Pulse (!) 105   Temp 98 F (36.7 C) (Oral)   Ht 6' (1.829 m)   Wt 154 lb (69.9 kg)   BMI 20.89 kg/m         Assessment & Plan:  1. Annual physical exam -The patient will get a follow-up colonoscopy in 2020 -Because of the recent problem with chest pain and pressure and positive family history for heart disease and he will be referred to the cardiologist for more extensive workup. - PSA, total and free - VITAMIN D 25 Hydroxy (Vit-D Deficiency, Fractures) - NMR, lipoprofile -  Bayer DCA Hb A1c Waived - Urinalysis, Complete - Microalbumin / creatinine urine ratio  2. Type 2 diabetes mellitus without complication, without long-term current use of insulin ([place]) -The patient will continue with current treatment pending results of lab work - Bayer DCA Hb A1c Waived - Microalbumin / creatinine urine ratio - Ambulatory referral to Cardiology  3. Pure hypercholesterolemia -Continue with aggressive therapeutic lifestyle changes and current treatment pending results of lab work - NMR, lipoprofile - Ambulatory referral to Cardiology  4. Essential hypertension -Repeat blood pressure was lower and there will be no change in treatment. - Ambulatory referral to Cardiology  5. Gastroesophageal reflux disease, esophagitis presence not specified -No complaints today with reflux and he will continue with protonix  6. Vitamin D deficiency -Intended current treatment pending results of lab work - VITAMIN D 25 Hydroxy (Vit-D Deficiency, Fractures)  7. Benign prostatic hyperplasia, unspecified whether lower urinary tract symptoms present -The patient is having no symptoms with passing his water. - PSA, total and free - Urinalysis,  Complete  8. Family history of heart disease - Ambulatory referral to Cardiology  9. 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 Neck pain - MR Cervical Spine Wo Contrast; Future  11. Sore throat - Rapid strep screen (not at [facility])  12. Nicotine abuse -Schedule visit with clinical pharmacy's for smoking cessation  13. Chest pressure -Referred to cardiology for further evaluation  Patient Instructions  Continue current medications. Continue good therapeutic lifestyle changes which include good diet and exercise. Fall precautions discussed with patient. If an FOBT was given today- please return  it to our front desk. If you are over 37 years old - you may need Prevnar 16 or the adult Pneumonia vaccine.  **Flu shots are available--- please call and schedule a FLU-CLINIC appointment**  After your visit with [place] today you will receive a survey in the mail or online from [employer] regarding your care with [place]. Please take a moment to fill this out. Your feedback is very important to [place] as you can help [place] better understand your patient needs as well as improve your experience and satisfaction. WE CARE ABOUT YOU!!!   We will arrange for you to have a visit with the cardiologist because of the recent visit and positive family history for heart disease to the emergency room with chest pressure Please make every effort to try to stop smoking We will get a rapid strep test today and if that is positive treat you for strep since you were exposed to your granddaughter who did have strep We will give you a Prevnar vaccine today and give you a Pneumovax in 1 year. Continue to drink plenty of fluids and stay active physically We will schedule you for an MRI because of the findings of the cervical spine and upper thoracic [place] with foraminal stenosis  Arrie Senate MD

## 2016-10-19 LAB — NMR, LIPOPROFILE
Cholesterol: 95 mg/dL — ABNORMAL LOW (ref 100–199)
HDL CHOLESTEROL BY NMR: 37 mg/dL — AB (ref >39)
HDL PARTICLE NUMBER: 29.8 umol/L — AB (ref >=30.5)
LDL Particle Number: 691 nmol/L (ref <1000)
LDL Size: 19.7 nm (ref >20.5)
LDL-C: 41 mg/dL (ref 0–99)
LP-IR Score: 52 — ABNORMAL HIGH (ref <=45)
Small LDL Particle Number: 544 nmol/L — ABNORMAL HIGH (ref <=527)
TRIGLYCERIDES BY NMR: 87 mg/dL (ref 0–149)

## 2016-10-19 LAB — MICROALBUMIN / CREATININE URINE RATIO
CREATININE, UR: 178.6 mg/dL
MICROALBUM., U, RANDOM: 12.3 ug/mL
Microalb/Creat Ratio: 6.9 mg/g creat (ref 0.0–30.0)

## 2016-10-19 LAB — PSA, TOTAL AND FREE
PSA FREE PCT: 18.6 %
PSA, Free: 0.41 ng/mL
Prostate Specific Ag, Serum: 2.2 ng/mL (ref 0.0–4.0)

## 2016-10-19 LAB — VITAMIN D 25 HYDROXY (VIT D DEFICIENCY, FRACTURES): VIT D 25 HYDROXY: 61.4 ng/mL (ref 30.0–100.0)

## 2016-10-23 ENCOUNTER — Encounter: Payer: Self-pay | Admitting: Pharmacist

## 2016-10-23 ENCOUNTER — Ambulatory Visit (INDEPENDENT_AMBULATORY_CARE_PROVIDER_SITE_OTHER): Payer: 59 | Admitting: Pharmacist

## 2016-10-23 DIAGNOSIS — F17200 Nicotine dependence, unspecified, uncomplicated: Secondary | ICD-10-CM | POA: Diagnosis not present

## 2016-10-23 DIAGNOSIS — Z716 Tobacco abuse counseling: Secondary | ICD-10-CM | POA: Diagnosis not present

## 2016-10-23 DIAGNOSIS — Z720 Tobacco use: Secondary | ICD-10-CM | POA: Diagnosis not present

## 2016-10-23 MED ORDER — VARENICLINE TARTRATE 0.5 MG X 11 & 1 MG X 42 PO MISC: ORAL | 0 refills | Status: DC

## 2016-10-23 MED ORDER — NICOTINE POLACRILEX 4 MG MT GUM: 4.0000 mg | CHEWING_GUM | OROMUCOSAL | 1 refills | Status: DC | PRN

## 2016-10-23 MED FILL — SM NICOTINE 4 MG CHEWING GU: 4 | 30 days supply | Qty: 110 | Fill #0

## 2016-10-23 MED FILL — CHANTIX STARTING MONTH BOX: 0.5 MG X 11 | 47 days supply | Qty: 53 | Fill #0

## 2016-10-23 NOTE — Progress Notes (Signed)
Subjective:     Bradley Goodman is a 61 y.o. male here for a discussion regarding smoking cessation. He began smoking when he was in his teens (about 45 years ago)  . He currently smokes 1 packs per day. He has attempted to quit smoking in the past several times. Best success quitting using Chantix. Barriers to quitting include: spouse/partner smokes and under a lot of stress now.  He states that his is committed to quit smoking due to recent CP episode.  Bradley Goodman has an episode of chest pain at work - he works at [employer] - on 10/12/2016.  He was seen in the [facility].  After work up in ER CP was determined to most likely be musculoskeletal however with patient's history of smoking, DM and family history of CVD he has been scheduled for evaluation with cardiologist.  Appt is in about 1 week.   Patient doesn't smoke at work (12 hour shifts) and he does not smoke in his house. His first cigarette is usually shortly after he gets up in am. He has about a 45 minutes commute to work and he does smoke in his car.  Last CT of chest / lung cancer screening 08/29/2015  The following portions of the patient's history were reviewed and updated as appropriate: allergies, current medications, past family history, past medical history, past social history, past surgical history and problem list.   Objective:   WDWN male in NAD Weight = 155 lbs BMI = 21   Assessment:    Tobacco Use/Cessation.  I assessed Bradley Goodman to be in an action stage with respect to tobacco use.    Plan:    I advised patient to quit, and offered support. Educational material distributed - patient given information about nicotine addiction and possible symptoms he might experience when he stops smoking (difficulty sleeping, irritability, increase appetite / weight, depression) Brochure with [place] Quit line information given to patient Discussed current use pattern and possible alternative activities when he is having a  craving Nicotine gum '4mg'$  - chew and park as needed for cravings.  Start chantix 0.'5mg'$  qd for 3 days, 0.'5mg'$  bid for 4 days, then increase to '1mg'$  bid thereafter - rx faxed to [facility] [place], PharmD, CPP, CDE

## 2016-10-29 ENCOUNTER — Other Ambulatory Visit: Payer: Self-pay | Admitting: Family Medicine

## 2016-10-30 ENCOUNTER — Ambulatory Visit (INDEPENDENT_AMBULATORY_CARE_PROVIDER_SITE_OTHER): Payer: 59 | Admitting: Cardiology

## 2016-10-30 ENCOUNTER — Encounter: Payer: Self-pay | Admitting: Cardiology

## 2016-10-30 VITALS — BP 140/105 | HR 114 | Ht 72.0 in | Wt 154.0 lb

## 2016-10-30 DIAGNOSIS — Z8249 Family history of ischemic heart disease and other diseases of the circulatory system: Secondary | ICD-10-CM | POA: Diagnosis not present

## 2016-10-30 DIAGNOSIS — E782 Mixed hyperlipidemia: Secondary | ICD-10-CM | POA: Diagnosis not present

## 2016-10-30 DIAGNOSIS — Z720 Tobacco use: Secondary | ICD-10-CM

## 2016-10-30 DIAGNOSIS — R030 Elevated blood-pressure reading, without diagnosis of hypertension: Secondary | ICD-10-CM

## 2016-10-30 DIAGNOSIS — R0789 Other chest pain: Secondary | ICD-10-CM

## 2016-10-30 MED FILL — ROSUVASTATIN CALCIUM 20 MG: 20 | 90 days supply | Qty: 90 | Fill #0

## 2016-10-30 NOTE — Patient Instructions (Signed)
Your physician has requested that you have a stress echocardiogram. For further information please visit [email or web address]. Please follow instruction sheet as given.     We will call you with results      Thank you for choosing [facility] !

## 2016-10-30 NOTE — Progress Notes (Signed)
Cardiology Office Note  Date: 10/30/2016   ID: Bradley Goodman, DOB Mar 17, 1956, MRN [id number]  PCP: Bradley Gainer, MD  Consulting Cardiologist: Bradley Lesches, MD   Chief Complaint  Patient presents with  . History of chest pain    History of Present Illness: Bradley Goodman is a 62 y.o. male referred for cardiology consultation by Bradley Goodman. I reviewed the chart. He was recently admitted to the hospital for observation with chest discomfort in early January. Cardiac markers were negative for ACS and ECG was overall nonspecific. 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 It was during one of these shifts that he began to experience symptoms. He describes a generalized tightness on the left side of his chest and numbness in his left arm, however mentions that it was somewhat positional and improved when he moved his arm a certain way. Of note he did have imaging of his neck that showed evidence of cervical spondylosis. He does not report recurring symptoms since hospital discharge, wears an activity tracker during his shifts and walks up to 5 miles during this time.  He does have a family history of premature CAD in first-degree male relatives. He has undergone prior GXT evaluations, not in the last 3 or 4 years however.  Recent echocardiogram is noted below showing normal LVEF at 60-65% without wall motion abnormalities. Small pericardial effusion was noted along the RV free wall. No fevers or chills, cough, residual chest soreness.  He has undergone prior CT imaging of the chest which incidentally noted scattered coronary artery calcifications.  We went over his medications which are outlined below. He takes aspirin and statin therapy. Not on a long-standing antihypertensive. Blood pressure is significantly elevated today, but he states that it often is elevated at health care appointments. He does check blood pressures regularly at  home and states that they are within normal range.  He continues to smoke cigarettes but is trying to quit again, has tried in the past. He just started on Chantix.  Past Medical History:  Diagnosis Date  . Cervical spondylolysis   . Essential hypertension   . GERD (gastroesophageal reflux disease)   . History of migraine   . Hyperlipidemia   . Type 2 diabetes mellitus ([place])     Past Surgical History:  Procedure Laterality Date  . Bilateral inguinal hernia repair    . COLONOSCOPY  01/23/2012   Procedure: COLONOSCOPY;  Surgeon: Bradley Dolin, MD;  Location: [facility];  Service: Endoscopy;  Laterality: N/A;  9:30 AM  . COLONOSCOPY N/A 10/11/2015   Procedure: COLONOSCOPY;  Surgeon: Bradley Dolin, MD;  Location: [facility];  Service: Endoscopy;  Laterality: N/A;  830    Current Outpatient Prescriptions  Medication Sig Dispense Refill  . aspirin EC 81 MG tablet Take 81 mg by mouth every evening.    . cholecalciferol (VITAMIN D) 1000 UNITS tablet Take 2,000 Units by mouth every evening. Pt takes two tablets daily    . Krill Oil 300 MG CAPS Take 1 capsule by mouth every evening.    . loratadine (CLARITIN) 10 MG tablet Take 10 mg by mouth every evening.     . nicotine polacrilex (NICORETTE) 4 MG gum Take 1 each (4 mg total) by mouth as needed for smoking cessation. 100 tablet 1  . pantoprazole (PROTONIX) 40 MG tablet Take 1 tablet (40 mg total) by mouth daily. 90 tablet 3  .  rosuvastatin (CRESTOR) 20 MG tablet TAKE 1 TABLET BY MOUTH DAILY 90 tablet 1  . varenicline (CHANTIX STARTING MONTH PAK) 0.5 MG X 11 & 1 MG X 42 tablet Take one 0.5 mg tab qd x 3 days, then increase to one 0.5 mg tab bid x 4 d, then increase to one 1 mg tab bid. 53 tablet 0   No current facility-administered medications for this visit.    Allergies:  Patient has no known allergies.   Social History: The patient  reports that he has been smoking Cigarettes.  He started smoking about 43 years ago. He has a 30.00  pack-year smoking history. He has never used smokeless tobacco. He reports that he drinks alcohol. He reports that he does not use drugs.   Family History: The patient's family history includes Heart attack in his brother, father, and mother.   ROS:  Please see the history of present illness. Otherwise, complete review of systems is positive for none.  All other systems are reviewed and negative.   Physical Exam: VS:  BP (!) 140/105 (BP Location: Right Arm)   Pulse (!) 114   Ht 6' (1.829 m)   Wt 154 lb (69.9 kg)   SpO2 97%   BMI 20.89 kg/m , BMI Body mass index is 20.89 kg/m.  Wt Readings from Last 3 Encounters:  10/30/16 154 lb (69.9 kg)  10/18/16 154 lb (69.9 kg)  10/13/16 152 lb 6.4 oz (69.1 kg)    General: Thin male, appears comfortable at rest. HEENT: Conjunctiva and lids normal, oropharynx clear. Neck: Supple, no elevated JVP or carotid bruits, no thyromegaly. Lungs: Clear to auscultation, nonlabored breathing at rest. Cardiac: Regular rate and rhythm, no S3 or significant systolic murmur, no pericardial rub. Abdomen: Soft, nontender, bowel sounds present, no guarding or rebound. Extremities: No pitting edema, distal pulses 2+. Skin: Warm and dry. Musculoskeletal: No kyphosis. Neuropsychiatric: Alert and oriented x3, affect grossly appropriate.  ECG: I personally reviewed the tracing from 10/12/2016 which showed sinus tachycardia with nonspecific ST changes.  Recent Labwork: 10/12/2016: ALT 23; AST 19; BUN 15; Creatinine, Ser 1.09; Hemoglobin 16.5; Platelets 216; Potassium 3.8; Sodium 138     Component Value Date/Time   CHOL 95 (L) 10/18/2016 0851   TRIG 87 10/18/2016 0851   HDL 37 (L) 10/18/2016 0851   LDLCALC 53 07/13/2014 0925    Other Studies Reviewed Today:  Chest x-ray 10/12/2016: FINDINGS: Mild hyperinflation. No acute infiltrate or effusion. Normal cardiomediastinal silhouette. No pneumothorax. Nodules noted on CT are not well appreciated  radiographically.  IMPRESSION: No radiographic evidence for acute cardiopulmonary abnormality  Echocardiogram 10/13/2016: Study Conclusions  - Left ventricle: The cavity size was normal. Systolic function was   normal. The estimated ejection fraction was in the range of 60%   to 65%. Wall motion was normal; there were no regional wall   motion abnormalities. - Aortic valve: Trileaflet; mildly thickened, mildly calcified   leaflets. - Atrial septum: There was increased thickness of the septum,   consistent with lipomatous hypertrophy. - Pulmonary arteries: Systolic pressure could not be accurately   estimated. - Pericardium, extracardiac: A small, free-flowing pericardial   effusion was identified along the right ventricular free wall.   The fluid had no internal echoes.  Assessment and Plan:  1. Atypical chest pain by description, however with significant cardiac risk factor profile including family history of premature CAD, hyperlipidemia on statin therapy, tobacco use, type 2 diabetes mellitus, and elevated blood pressure recordings. I reviewed his  recent hospital observation with ECG, cardiac enzymes, and echocardiogram. Our plan will be to further evaluate with an exercise echocardiogram to assess for any potential ischemia. Would otherwise recommend risk factor modification strategies. We discussed his efforts at smoking cessation, also keeping a close eye on blood pressure in case antihypertensives are needed a standing basis.  2. Tobacco use. He is trying to quit again. Just started on Chantix.  3. Hyperlipidemia, on Crestor. Recent LDL 41.  4. Type 2 diabetes mellitus followed by Bradley Goodman. Recent hemoglobin A1c 5.6.  5. Elevated blood pressure reading. I asked him to keep a close eye on this as he may need standing antihypertensive. He states his blood pressure is usually normal range at home.  Current medicines were reviewed with the patient today.   Orders Placed This  Encounter  Procedures  . ECHOCARDIOGRAM STRESS TEST    Disposition: Call with test results.  Signed, Satira Sark, MD, [facility] 10/30/2016 1:23 PM    [facility] at [facility] [address] [place],  [address] Phone: [phone]; Fax: [phone]

## 2016-11-07 ENCOUNTER — Ambulatory Visit (HOSPITAL_COMMUNITY)
Admission: RE | Admit: 2016-11-07 | Discharge: 2016-11-07 | Disposition: A | Discharge status: Home / Self Care | Payer: 59 | Source: Ambulatory Visit | Attending: Cardiology | Admitting: Cardiology

## 2016-11-07 DIAGNOSIS — Z8249 Family history of ischemic heart disease and other diseases of the circulatory system: Secondary | ICD-10-CM | POA: Diagnosis not present

## 2016-11-07 DIAGNOSIS — R0789 Other chest pain: Secondary | ICD-10-CM | POA: Diagnosis not present

## 2016-11-07 LAB — ECHOCARDIOGRAM STRESS TEST
CHL CUP MPHR: 161 {beats}/min
CHL CUP RESTING HR STRESS: 107 {beats}/min
CSEPHR: 99 %
CSEPPHR: 160 {beats}/min
Estimated workload: 10.1 METS
Exercise duration (min): 7 min
Exercise duration (sec): 8 s
RPE: 11

## 2016-11-07 NOTE — Progress Notes (Signed)
*  PRELIMINARY RESULTS* Echocardiogram 2D Echocardiogram has been performed.  Bradley Goodman 11/07/2016, 11:41 AM

## 2016-12-03 ENCOUNTER — Other Ambulatory Visit: Payer: Self-pay | Admitting: *Deleted

## 2016-12-03 MED ORDER — VARENICLINE TARTRATE 1 MG PO TABS: 1.0000 mg | ORAL_TABLET | Freq: Two times a day (BID) | ORAL | 0 refills | Status: DC

## 2016-12-03 MED FILL — CHANTIX 1 MG CONT MONTH BOX: 1 | 28 days supply | Qty: 56 | Fill #0

## 2016-12-03 NOTE — Telephone Encounter (Signed)
Closing encounter taken care of in other encounter

## 2017-01-09 DIAGNOSIS — M5412 Radiculopathy, cervical region: Secondary | ICD-10-CM | POA: Diagnosis not present

## 2017-01-09 DIAGNOSIS — M50223 Other cervical disc displacement at C6-C7 level: Secondary | ICD-10-CM | POA: Diagnosis not present

## 2017-01-09 DIAGNOSIS — M4126 Other idiopathic scoliosis, lumbar region: Secondary | ICD-10-CM | POA: Diagnosis not present

## 2017-01-09 DIAGNOSIS — R030 Elevated blood-pressure reading, without diagnosis of hypertension: Secondary | ICD-10-CM | POA: Diagnosis not present

## 2017-01-09 DIAGNOSIS — M542 Cervicalgia: Secondary | ICD-10-CM | POA: Diagnosis not present

## 2017-01-09 DIAGNOSIS — M47812 Spondylosis without myelopathy or radiculopathy, cervical region: Secondary | ICD-10-CM | POA: Diagnosis not present

## 2017-01-17 ENCOUNTER — Encounter (HOSPITAL_COMMUNITY): Payer: Self-pay | Admitting: Emergency Medicine

## 2017-01-17 ENCOUNTER — Emergency Department (HOSPITAL_COMMUNITY)
Admission: EM | Admit: 2017-01-17 | Discharge: 2017-01-17 | Disposition: A | Discharge status: Home / Self Care | Payer: No Typology Code available for payment source | Attending: Emergency Medicine | Admitting: Emergency Medicine

## 2017-01-17 ENCOUNTER — Emergency Department (HOSPITAL_COMMUNITY): Payer: No Typology Code available for payment source

## 2017-01-17 DIAGNOSIS — R079 Chest pain, unspecified: Secondary | ICD-10-CM | POA: Diagnosis not present

## 2017-01-17 DIAGNOSIS — Z7982 Long term (current) use of aspirin: Secondary | ICD-10-CM | POA: Insufficient documentation

## 2017-01-17 DIAGNOSIS — S199XXA Unspecified injury of neck, initial encounter: Secondary | ICD-10-CM | POA: Diagnosis not present

## 2017-01-17 DIAGNOSIS — I10 Essential (primary) hypertension: Secondary | ICD-10-CM | POA: Insufficient documentation

## 2017-01-17 DIAGNOSIS — Y999 Unspecified external cause status: Secondary | ICD-10-CM | POA: Diagnosis not present

## 2017-01-17 DIAGNOSIS — S83242A Other tear of medial meniscus, current injury, left knee, initial encounter: Secondary | ICD-10-CM | POA: Diagnosis not present

## 2017-01-17 DIAGNOSIS — Y9389 Activity, other specified: Secondary | ICD-10-CM | POA: Diagnosis not present

## 2017-01-17 DIAGNOSIS — E119 Type 2 diabetes mellitus without complications: Secondary | ICD-10-CM | POA: Diagnosis not present

## 2017-01-17 DIAGNOSIS — Z79899 Other long term (current) drug therapy: Secondary | ICD-10-CM | POA: Insufficient documentation

## 2017-01-17 DIAGNOSIS — F17210 Nicotine dependence, cigarettes, uncomplicated: Secondary | ICD-10-CM | POA: Diagnosis not present

## 2017-01-17 DIAGNOSIS — S161XXA Strain of muscle, fascia and tendon at neck level, initial encounter: Secondary | ICD-10-CM

## 2017-01-17 DIAGNOSIS — Y92410 Unspecified street and highway as the place of occurrence of the external cause: Secondary | ICD-10-CM | POA: Insufficient documentation

## 2017-01-17 DIAGNOSIS — M542 Cervicalgia: Secondary | ICD-10-CM | POA: Diagnosis not present

## 2017-01-17 LAB — I-STAT CHEM 8, ED
BUN: 16 mg/dL (ref 6–20)
CALCIUM ION: 1.16 mmol/L (ref 1.15–1.40)
Chloride: 104 mmol/L (ref 101–111)
Creatinine, Ser: 0.9 mg/dL (ref 0.61–1.24)
Glucose, Bld: 91 mg/dL (ref 65–99)
HCT: 46 % (ref 39.0–52.0)
Hemoglobin: 15.6 g/dL (ref 13.0–17.0)
Potassium: 3.9 mmol/L (ref 3.5–5.1)
SODIUM: 141 mmol/L (ref 135–145)
TCO2: 25 mmol/L (ref 0–100)
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
## 2017-01-17 MED ORDER — SODIUM CHLORIDE 0.9 % IV SOLN
INTRAVENOUS | Status: DC
Start: 2017-01-17 — End: 2017-01-17
  Administered 2017-01-17: 19:00:00 via INTRAVENOUS

## 2017-01-17 MED ORDER — CYCLOBENZAPRINE HCL 10 MG PO TABS: 10.0000 mg | ORAL_TABLET | Freq: Two times a day (BID) | ORAL | 0 refills | Status: DC | PRN

## 2017-01-17 MED ORDER — IOPAMIDOL (ISOVUE-300) INJECTION 61%
75.0000 mL | Freq: Once | INTRAVENOUS | Status: AC | PRN
  Administered 2017-01-17: 75 mL via INTRAVENOUS

## 2017-01-17 MED FILL — PANTOPRAZOLE SOD DR 40 MG T: 40 | 90 days supply | Qty: 90 | Fill #2
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
## 2017-01-17 NOTE — Discharge Instructions (Signed)
CT of neck and chest without evidence of any acute injuries. Take the Flexeril as directed. Supplement with Advil or Naprosyn as needed. Return for the development of any abdominal pain is is completely delayed internal injury to the abdomen.  Work note provided.
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
## 2017-02-22 MED FILL — ROSUVASTATIN CALCIUM 20 MG: 20 | 90 days supply | Qty: 90 | Fill #1

## 2017-03-11 ENCOUNTER — Ambulatory Visit (INDEPENDENT_AMBULATORY_CARE_PROVIDER_SITE_OTHER): Payer: 59 | Admitting: Family Medicine

## 2017-03-11 ENCOUNTER — Encounter: Payer: Self-pay | Admitting: Family Medicine

## 2017-03-11 VITALS — BP 145/103 | HR 87 | Temp 97.7°F | Ht 72.0 in | Wt 160.6 lb

## 2017-03-11 DIAGNOSIS — I10 Essential (primary) hypertension: Secondary | ICD-10-CM | POA: Diagnosis not present

## 2017-03-11 DIAGNOSIS — E119 Type 2 diabetes mellitus without complications: Secondary | ICD-10-CM | POA: Diagnosis not present

## 2017-03-11 DIAGNOSIS — Z860100 Personal history of colon polyps, unspecified: Secondary | ICD-10-CM

## 2017-03-11 DIAGNOSIS — K219 Gastro-esophageal reflux disease without esophagitis: Secondary | ICD-10-CM | POA: Diagnosis not present

## 2017-03-11 DIAGNOSIS — Z23 Encounter for immunization: Secondary | ICD-10-CM

## 2017-03-11 DIAGNOSIS — E7800 Pure hypercholesterolemia, unspecified: Secondary | ICD-10-CM

## 2017-03-11 NOTE — Patient Instructions (Addendum)
Continue current medications. Continue good therapeutic lifestyle changes which include good diet and exercise. Fall precautions discussed with patient. If an FOBT was given today- please return it to our front desk. If you are over 60 years old - you may need Prevnar 69 or the adult Pneumonia vaccine.   After your visit with [place] today you will receive a survey in the mail or online from [employer] regarding your care with [place]. Please take a moment to fill this out. Your feedback is very important to [place] as you can help [place] better understand your patient needs as well as improve your experience and satisfaction. WE CARE ABOUT YOU!!!  The patient will bring home blood pressure readings by and have his blood pressure rechecked by the nurse here in the next couple weeks. He will return to the office fasting for his blood work.
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
## 2017-05-02 MED FILL — PANTOPRAZOLE SOD DR 40 MG T: 40 | 90 days supply | Qty: 90 | Fill #3

## 2017-05-29 ENCOUNTER — Other Ambulatory Visit: Payer: Self-pay | Admitting: Family Medicine

## 2017-05-30 MED FILL — ROSUVASTATIN CALCIUM 20 MG: 20 | 90 days supply | Qty: 90 | Fill #0

## 2017-06-18 DIAGNOSIS — H524 Presbyopia: Secondary | ICD-10-CM | POA: Diagnosis not present

## 2017-06-18 DIAGNOSIS — H5202 Hypermetropia, left eye: Secondary | ICD-10-CM | POA: Diagnosis not present

## 2017-06-18 DIAGNOSIS — H5211 Myopia, right eye: Secondary | ICD-10-CM | POA: Diagnosis not present

## 2017-06-18 DIAGNOSIS — H52223 Regular astigmatism, bilateral: Secondary | ICD-10-CM | POA: Diagnosis not present

## 2017-06-18 LAB — HM DIABETES EYE EXAM

## 2017-07-15 ENCOUNTER — Ambulatory Visit: Payer: 59 | Admitting: Family Medicine

## 2017-07-25 ENCOUNTER — Ambulatory Visit (INDEPENDENT_AMBULATORY_CARE_PROVIDER_SITE_OTHER): Payer: 59 | Admitting: Family Medicine

## 2017-07-25 ENCOUNTER — Encounter: Payer: Self-pay | Admitting: Family Medicine

## 2017-07-25 VITALS — BP 149/101 | HR 97 | Temp 98.0°F | Ht 72.0 in | Wt 157.0 lb

## 2017-07-25 DIAGNOSIS — E7800 Pure hypercholesterolemia, unspecified: Secondary | ICD-10-CM | POA: Diagnosis not present

## 2017-07-25 DIAGNOSIS — I152 Hypertension secondary to endocrine disorders: Secondary | ICD-10-CM

## 2017-07-25 DIAGNOSIS — I10 Essential (primary) hypertension: Secondary | ICD-10-CM

## 2017-07-25 DIAGNOSIS — E119 Type 2 diabetes mellitus without complications: Secondary | ICD-10-CM | POA: Diagnosis not present

## 2017-07-25 DIAGNOSIS — K219 Gastro-esophageal reflux disease without esophagitis: Secondary | ICD-10-CM | POA: Diagnosis not present

## 2017-07-25 DIAGNOSIS — E1159 Type 2 diabetes mellitus with other circulatory complications: Secondary | ICD-10-CM

## 2017-07-25 DIAGNOSIS — E559 Vitamin D deficiency, unspecified: Secondary | ICD-10-CM

## 2017-07-25 MED ORDER — LOSARTAN POTASSIUM 50 MG PO TABS: 50.0000 mg | ORAL_TABLET | Freq: Every day | ORAL | 3 refills | Status: DC

## 2017-07-25 MED FILL — LOSARTAN POTASSIUM 50 MG TA: 50 | 90 days supply | Qty: 90 | Fill #0

## 2017-07-25 NOTE — Progress Notes (Signed)
Subjective:    Patient ID: Bradley Goodman, male    DOB: 08/13/1956, 61 y.o.   MRN: [id number]  HPI  Pt here for follow up and management of chronic medical problems which includes diabetes, hypertension and hyperlipidemia. He is taking medication regularly.  Patient continues to do well and has no complaints.  His blood pressure is elevated today.  He has diabetes which is controlled without medication.  He is not taking anything for his blood pressure.  He also has hyperlipidemia and gastroesophageal reflux disease.  Patient is doing well.  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  He has had trouble with his blood pressure in the past and it is elevated again today and he is currently not taking any medication for this.  He denies any chest pain or shortness of breath.  He denies any trouble with nausea vomiting diarrhea or blood in the stool.  He is passing his water without problems.    Patient Active Problem List   Diagnosis Date Noted  . Atypical chest pain 10/12/2016  . History of colonic polyps   . History of adenomatous polyp of colon 09/14/2015  . GERD (gastroesophageal reflux disease) 06/24/2013  . Diabetes mellitus type 2 controlled 06/24/2013  . Hyperlipidemia 06/24/2013  . Hypertension 06/24/2013   Outpatient Encounter Prescriptions as of 07/25/2017  Medication Sig  . aspirin EC 81 MG tablet Take 81 mg by mouth every evening.  . cholecalciferol (VITAMIN D) 1000 UNITS tablet Take 2,000 Units by mouth every evening. Pt takes two tablets daily  . Krill Oil 300 MG CAPS Take 1 capsule by mouth every evening.  . loratadine (CLARITIN) 10 MG tablet Take 10 mg by mouth every evening.   . nicotine polacrilex (NICORETTE) 4 MG gum Take 1 each (4 mg total) by mouth as needed for smoking cessation.  . pantoprazole (PROTONIX) 40 MG tablet Take 1 tablet (40 mg total) by mouth daily.  . rosuvastatin (CRESTOR) 20 MG tablet TAKE 1 TABLET BY  MOUTH DAILY  . varenicline (CHANTIX CONTINUING MONTH PAK) 1 MG tablet Take 1 tablet (1 mg total) by mouth 2 (two) times daily.   No facility-administered encounter medications on file as of 07/25/2017.       Review of Systems  Constitutional: Negative.   HENT: Negative.   Eyes: Negative.   Respiratory: Negative.   Cardiovascular: Negative.   Gastrointestinal: Negative.   Endocrine: Negative.   Genitourinary: Negative.   Musculoskeletal: Negative.   Skin: Negative.   Allergic/Immunologic: Negative.   Neurological: Negative.   Hematological: Negative.   Psychiatric/Behavioral: Negative.        Objective:   Physical Exam  Constitutional: He is oriented to person, place, and time. He appears well-developed and well-nourished. No distress.  Patient is pleasant and always somewhat uptight with his visit to the doctor's office  HENT:  Head: Normocephalic and atraumatic.  Right Ear: External ear normal.  Left Ear: External ear normal.  Nose: Nose normal.  Mouth/Throat: Oropharynx is clear and moist. No oropharyngeal exudate.  Eyes: Pupils are equal, round, and reactive to light. Conjunctivae and EOM are normal. Right eye exhibits no discharge. Left eye exhibits no discharge. No scleral icterus.  Neck: Normal range of motion. Neck supple. No thyromegaly present.  No bruits thyromegaly or anterior cervical adenopathy  Cardiovascular: Normal rate, regular rhythm, normal heart sounds and intact distal pulses.   No murmur heard. Heart has  a regular rate and rhythm at 96/min  Pulmonary/Chest: Effort normal and breath sounds normal. No respiratory distress. He has no wheezes. He has no rales.  Clear anteriorly and posteriorly and no axillary adenopathy  Abdominal: Soft. Bowel sounds are normal. He exhibits no mass. There is no tenderness. There is no rebound and no guarding.  No abdominal tenderness masses or organ enlargement or bruits  Musculoskeletal: Normal range of motion. He  exhibits no edema.  Lymphadenopathy:    He has no cervical adenopathy.  Neurological: He is alert and oriented to person, place, and time. He has normal reflexes. No cranial nerve deficit.  Skin: Skin is warm and dry. No rash noted.  Psychiatric: He has a normal mood and affect. His behavior is normal. Judgment and thought content normal.  Nursing note and vitals reviewed.   BP (!) 149/101 (BP Location: Left Arm)   Pulse 97   Temp 98 F (36.7 C) (Oral)   Ht 6' (1.829 m)   Wt 157 lb (71.2 kg)   BMI 21.29 kg/m        Assessment & Plan:  1. Pure hypercholesterolemia -Continue with current treatment pending results of lab work - CBC with Differential/Platelet; Future - Lipid panel; Future  2. Essential hypertension -Start losartan 50 mg 1 daily - CBC with Differential/Platelet; Future - BMP8+EGFR; Future - Hepatic function panel; Future  3. Gastroesophageal reflux disease, esophagitis presence not specified -Continue with antireflux measures and current treatment - CBC with Differential/Platelet; Future - Hepatic function panel; Future  4. Type 2 diabetes mellitus without complication, without long-term current use of insulin (HCC) -Check blood sugars periodically and follow aggressive therapeutic lifestyle changes - CBC with Differential/Platelet; Future - Bayer DCA Hb A1c Waived; Future  5. Vitamin D deficiency -Continue current treatment pending results of lab work - VITAMIN D 25 Hydroxy (Vit-D Deficiency, Fractures); Future  6. Hypertension associated with diabetes ([place]) -Start losartan 50 mg 1 daily  Meds ordered this encounter  Medications  . losartan (COZAAR) 50 MG tablet    Sig: Take 1 tablet (50 mg total) by mouth daily.    Dispense:  90 tablet    Refill:  3   Patient Instructions   Continue current medications. Continue good therapeutic lifestyle changes which include good diet and exercise. Fall precautions discussed with patient. If an FOBT was  given today- please return it to our front desk. If you are over 79 years old - you may need Prevnar 49 or the adult Pneumonia vaccine.  **Flu shots are available--- please call and schedule a FLU-CLINIC appointment**  After your visit with [place] today you will receive a survey in the mail or online from [employer] regarding your care with [place]. Please take a moment to fill this out. Your feedback is very important to [place] as you can help [place] better understand your patient needs as well as improve your experience and satisfaction. WE CARE ABOUT YOU!!!   Losartan 50 mg 1 daily Recheck blood pressure in a couple weeks and repeat BMP at that time Continue to watch sodium intake Check blood pressure readings at home and bring these readings with you when your blood pressure was rechecked here in a couple of weeks  Arrie Senate MD

## 2017-07-25 NOTE — Patient Instructions (Addendum)
  Continue current medications. Continue good therapeutic lifestyle changes which include good diet and exercise. Fall precautions discussed with patient. If an FOBT was given today- please return it to our front desk. If you are over 60 years old - you may need Prevnar 76 or the adult Pneumonia vaccine.  **Flu shots are available--- please call and schedule a FLU-CLINIC appointment**  After your visit with [place] today you will receive a survey in the mail or online from [employer] regarding your care with [place]. Please take a moment to fill this out. Your feedback is very important to [place] as you can help [place] better understand your patient needs as well as improve your experience and satisfaction. WE CARE ABOUT YOU!!!   Losartan 50 mg 1 daily Recheck blood pressure in a couple weeks and repeat BMP at that time Continue to watch sodium intake Check blood pressure readings at home and bring these readings with you when your blood pressure was rechecked here in a couple of weeks

## 2017-07-30 ENCOUNTER — Other Ambulatory Visit: Payer: 59

## 2017-07-30 DIAGNOSIS — E559 Vitamin D deficiency, unspecified: Secondary | ICD-10-CM

## 2017-07-30 DIAGNOSIS — K219 Gastro-esophageal reflux disease without esophagitis: Secondary | ICD-10-CM

## 2017-07-30 DIAGNOSIS — E7800 Pure hypercholesterolemia, unspecified: Secondary | ICD-10-CM | POA: Diagnosis not present

## 2017-07-30 DIAGNOSIS — E119 Type 2 diabetes mellitus without complications: Secondary | ICD-10-CM

## 2017-07-30 DIAGNOSIS — I10 Essential (primary) hypertension: Secondary | ICD-10-CM

## 2017-07-30 LAB — BAYER DCA HB A1C WAIVED: HB A1C: 5.8 % (ref <7.0)

## 2017-07-31 LAB — HEPATIC FUNCTION PANEL
ALT: 19 IU/L (ref 0–44)
AST: 14 IU/L (ref 0–40)
Albumin: 4 g/dL (ref 3.6–4.8)
Alkaline Phosphatase: 74 IU/L (ref 39–117)
BILIRUBIN, DIRECT: 0.08 mg/dL (ref 0.00–0.40)
Bilirubin Total: 0.2 mg/dL (ref 0.0–1.2)
Total Protein: 6.5 g/dL (ref 6.0–8.5)

## 2017-07-31 LAB — LIPID PANEL
CHOL/HDL RATIO: 2.8 ratio (ref 0.0–5.0)
Cholesterol, Total: 93 mg/dL — ABNORMAL LOW (ref 100–199)
HDL: 33 mg/dL — AB (ref >39)
LDL CALC: 38 mg/dL (ref 0–99)
Triglycerides: 111 mg/dL (ref 0–149)
VLDL CHOLESTEROL CAL: 22 mg/dL (ref 5–40)

## 2017-07-31 LAB — CBC WITH DIFFERENTIAL/PLATELET
BASOS: 1 %
Basophils Absolute: 0 10*3/uL (ref 0.0–0.2)
EOS (ABSOLUTE): 0.1 10*3/uL (ref 0.0–0.4)
Eos: 2 %
HEMOGLOBIN: 15.5 g/dL (ref 13.0–17.7)
Hematocrit: 46.4 % (ref 37.5–51.0)
IMMATURE GRANS (ABS): 0 10*3/uL (ref 0.0–0.1)
IMMATURE GRANULOCYTES: 0 %
LYMPHS: 27 %
Lymphocytes Absolute: 2.2 10*3/uL (ref 0.7–3.1)
MCH: 29.9 pg (ref 26.6–33.0)
MCHC: 33.4 g/dL (ref 31.5–35.7)
MCV: 89 fL (ref 79–97)
Monocytes Absolute: 0.5 10*3/uL (ref 0.1–0.9)
Monocytes: 6 %
NEUTROS PCT: 64 %
Neutrophils Absolute: 5.4 10*3/uL (ref 1.4–7.0)
Platelets: 222 10*3/uL (ref 150–379)
RBC: 5.19 x10E6/uL (ref 4.14–5.80)
RDW: 14.6 % (ref 12.3–15.4)
WBC: 8.3 10*3/uL (ref 3.4–10.8)

## 2017-07-31 LAB — BMP8+EGFR
BUN / CREAT RATIO: 14 (ref 10–24)
BUN: 15 mg/dL (ref 8–27)
CHLORIDE: 106 mmol/L (ref 96–106)
CO2: 26 mmol/L (ref 20–29)
CREATININE: 1.06 mg/dL (ref 0.76–1.27)
Calcium: 9.4 mg/dL (ref 8.6–10.2)
GFR calc Af Amer: 88 mL/min/{1.73_m2} (ref >59)
GFR calc non Af Amer: 76 mL/min/{1.73_m2} (ref >59)
GLUCOSE: 103 mg/dL — AB (ref 65–99)
Potassium: 4.7 mmol/L (ref 3.5–5.2)
SODIUM: 145 mmol/L — AB (ref 134–144)

## 2017-07-31 LAB — VITAMIN D 25 HYDROXY (VIT D DEFICIENCY, FRACTURES): VIT D 25 HYDROXY: 66.5 ng/mL (ref 30.0–100.0)

## 2017-08-26 ENCOUNTER — Other Ambulatory Visit: Payer: Self-pay | Admitting: Family Medicine

## 2017-08-26 MED FILL — PANTOPRAZOLE SOD DR 40 MG T: 40 | 90 days supply | Qty: 90 | Fill #0

## 2017-11-20 ENCOUNTER — Other Ambulatory Visit: Payer: Self-pay | Admitting: *Deleted

## 2017-11-20 MED ORDER — ROSUVASTATIN CALCIUM 20 MG PO TABS: 20.0000 mg | ORAL_TABLET | Freq: Every day | ORAL | 3 refills | Status: DC

## 2017-11-20 MED FILL — ROSUVASTATIN CALCIUM 20 MG: 20 | 90 days supply | Qty: 90 | Fill #1

## 2017-12-11 ENCOUNTER — Encounter: Payer: Self-pay | Admitting: Family Medicine

## 2017-12-11 ENCOUNTER — Ambulatory Visit: Payer: 59 | Admitting: Family Medicine

## 2017-12-11 VITALS — BP 129/85 | HR 101 | Temp 98.6°F | Ht 72.0 in | Wt 155.0 lb

## 2017-12-11 DIAGNOSIS — E559 Vitamin D deficiency, unspecified: Secondary | ICD-10-CM | POA: Diagnosis not present

## 2017-12-11 DIAGNOSIS — I10 Essential (primary) hypertension: Secondary | ICD-10-CM

## 2017-12-11 DIAGNOSIS — K219 Gastro-esophageal reflux disease without esophagitis: Secondary | ICD-10-CM | POA: Diagnosis not present

## 2017-12-11 DIAGNOSIS — Z86010 Personal history of colonic polyps: Secondary | ICD-10-CM

## 2017-12-11 DIAGNOSIS — Z0000 Encounter for general adult medical examination without abnormal findings: Secondary | ICD-10-CM

## 2017-12-11 DIAGNOSIS — Z8249 Family history of ischemic heart disease and other diseases of the circulatory system: Secondary | ICD-10-CM

## 2017-12-11 DIAGNOSIS — E119 Type 2 diabetes mellitus without complications: Secondary | ICD-10-CM

## 2017-12-11 DIAGNOSIS — N400 Enlarged prostate without lower urinary tract symptoms: Secondary | ICD-10-CM

## 2017-12-11 DIAGNOSIS — E7800 Pure hypercholesterolemia, unspecified: Secondary | ICD-10-CM

## 2017-12-11 LAB — URINALYSIS, COMPLETE
Bilirubin, UA: NEGATIVE
GLUCOSE, UA: NEGATIVE
LEUKOCYTES UA: NEGATIVE
NITRITE UA: NEGATIVE
Protein, UA: NEGATIVE
RBC, UA: NEGATIVE
Specific Gravity, UA: 1.025 (ref 1.005–1.030)
Urobilinogen, Ur: 0.2 mg/dL (ref 0.2–1.0)
pH, UA: 6 (ref 5.0–7.5)

## 2017-12-11 LAB — BAYER DCA HB A1C WAIVED: HB A1C (BAYER DCA - WAIVED): 5.2 % (ref <7.0)

## 2017-12-11 NOTE — Progress Notes (Signed)
Subjective:    Patient ID: Bradley Goodman, male    DOB: 02-10-1956, 62 y.o.   MRN: [id number]  HPI Patient is here today for annual wellness exam and follow up of chronic medical problems which includes hypertension and diabetes. He is taking medication regularly.  The patient is doing well overall and as usual has no complaints.  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  He is due rectal exam PSA lab work FOBT card and urinalysis.  He did have a CT scan done in April 2018.  There were no acute pulmonary findings at that time.  There were no new or worrisome pulmonary lesions and no mediastinal mass or adenopathy.  Everything appeared to be stable.  No repeat CT scans were recommended.  She denies any chest pain pressure tightness or shortness of breath.  Unfortunately he is still smoking 1 pack of cigarettes daily.  Denies any trouble with swallowing heartburn indigestion nausea vomiting diarrhea or blood in the stool.  He is passing his water without problems.  His last colonoscopy was in January 2017 and according to the patient he was told he needed another one in 3 years so this was placed that next colonoscopy In January 2020.    Patient Active Problem List   Diagnosis Date Noted  . Atypical chest pain 10/12/2016  . History of colonic polyps   . History of adenomatous polyp of colon 09/14/2015  . GERD (gastroesophageal reflux disease) 06/24/2013  . Diabetes mellitus type 2 controlled 06/24/2013  . Hyperlipidemia 06/24/2013  . Hypertension 06/24/2013   Outpatient Encounter Medications as of 12/11/2017  Medication Sig  . aspirin EC 81 MG tablet Take 81 mg by mouth every evening.  . cholecalciferol (VITAMIN D) 1000 UNITS tablet Take 2,000 Units by mouth every evening. Pt takes two tablets daily  . Krill Oil 300 MG CAPS Take 1 capsule by mouth every evening.  . loratadine (CLARITIN) 10 MG tablet Take 10 mg by mouth every evening.   Marland Kitchen losartan (COZAAR) 50 MG tablet Take 1 tablet  (50 mg total) by mouth daily.  . nicotine polacrilex (NICORETTE) 4 MG gum Take 1 each (4 mg total) by mouth as needed for smoking cessation.  . pantoprazole (PROTONIX) 40 MG tablet TAKE 1 TABLET BY MOUTH DAILY.  . rosuvastatin (CRESTOR) 20 MG tablet Take 1 tablet (20 mg total) by mouth daily.  . varenicline (CHANTIX CONTINUING MONTH PAK) 1 MG tablet Take 1 tablet (1 mg total) by mouth 2 (two) times daily.   No facility-administered encounter medications on file as of 12/11/2017.       Review of Systems  Constitutional: Negative.   HENT: Negative.   Eyes: Negative.   Respiratory: Negative.   Cardiovascular: Negative.   Gastrointestinal: Negative.   Endocrine: Negative.   Genitourinary: Negative.   Musculoskeletal: Negative.   Skin: Negative.   Allergic/Immunologic: Negative.   Neurological: Negative.   Hematological: Negative.   Psychiatric/Behavioral: Negative.        Objective:   Physical Exam  Constitutional: He is oriented to person, place, and time. He appears well-developed and well-nourished. No distress.  The patient is a slender pleasant and alert and unfortunately still smoking.  HENT:  Head: Normocephalic and atraumatic.  Right Ear: External ear normal.  Left Ear: External ear normal.  Mouth/Throat: Oropharynx is clear and moist. No oropharyngeal exudate.  Slight nasal congestion left greater than right  Eyes: Conjunctivae and EOM are normal. Pupils are equal, round,  and reactive to light. Right eye exhibits no discharge. Left eye exhibits no discharge. No scleral icterus.  Last eye exam in September 2018 or near that time.  Neck: Normal range of motion. Neck supple. No thyromegaly present.  No bruits thyromegaly or anterior cervical adenopathy  Cardiovascular: Normal rate, regular rhythm, normal heart sounds and intact distal pulses.  No murmur heard. The heart is regular at 84/min  Pulmonary/Chest: Effort normal. No respiratory distress. He has wheezes. He has  no rales. He exhibits no tenderness.  Clear anteriorly and posteriorly, rare wheeze.  No axillary adenopathy  Abdominal: Soft. Bowel sounds are normal. He exhibits no mass. There is no tenderness. There is no rebound and no guarding.  No liver or spleen enlargement.  No masses.  No tenderness.  No inguinal adenopathy.  Genitourinary: Rectum normal and penis normal.  Genitourinary Comments: The prostate gland was slightly enlarged without lumps or masses.  There were no rectal masses.  External genitalia were within normal limits with no inguinal hernias being palpated.  Musculoskeletal: Normal range of motion. He exhibits no edema.  Lymphadenopathy:    He has no cervical adenopathy.  Neurological: He is alert and oriented to person, place, and time. He has normal reflexes. No cranial nerve deficit.  Skin: Skin is warm and dry. No rash noted.  Psychiatric: He has a normal mood and affect. His behavior is normal. Judgment and thought content normal.  Nursing note and vitals reviewed.  BP 129/85 (BP Location: Right Arm)   Pulse (!) 101   Temp 98.6 F (37 C) (Oral)   Ht 6' (1.829 m)   Wt 155 lb (70.3 kg)   BMI 21.02 kg/m         Assessment & Plan:  1. Annual physical exam -Next colonoscopy will be due and January 2020 based on patient telling me he needed one in 3 years from the previous one.  He is not having any GI symptoms.  He only had colon polyps previously. - CBC with Differential/Platelet - BMP8+EGFR - Lipid panel - VITAMIN D 25 Hydroxy (Vit-D Deficiency, Fractures) - Hepatic function panel - PSA, total and free - Urinalysis, Complete  2. Pure hypercholesterolemia -Tinea current treatment and aggressive therapeutic lifestyle changes - CBC with Differential/Platelet - Lipid panel  3. Essential hypertension -The blood pressure is good today and it also runs well at home. - CBC with Differential/Platelet - BMP8+EGFR - Hepatic function panel  4. Gastroesophageal  reflux disease, esophagitis presence not specified -Continue with pantoprazole and diet of avoidance - CBC with Differential/Platelet - Hepatic function panel  5. Type 2 diabetes mellitus without complication, without long-term current use of insulin (HCC) -The patient is currently controlling his blood sugars with diet only and is on no medication for blood sugar control. - CBC with Differential/Platelet - Bayer DCA Hb A1c Waived  6. Vitamin D deficiency -Continue vitamin D replacement pending results of lab work - CBC with Differential/Platelet - VITAMIN D 25 Hydroxy (Vit-D Deficiency, Fractures)  7. History of colonic polyps -Follow-up with Dr. Gala Romney for repeat colonoscopy in January 2020 which would be 3 years from the previous colonoscopy.  8. Family history of heart disease -The patient did have a thorough workup in the past year and a half from a visit to the emergency room and everything was stable from that visit.  9. History of adenomatous polyp of colon -Continue annual FOBT checks and get colonoscopy as planned by gastroenterologist in early 2020  10. Benign prostatic  hyperplasia without lower urinary tract symptoms -No symptoms with this particular problem. -Urinalysis is pending  Patient Instructions  Continue current medications. Continue good therapeutic lifestyle changes which include good diet and exercise. Fall precautions discussed with patient. If an FOBT was given today- please return it to our front desk. If you are over 66 years old - you may need Prevnar 84 or the adult Pneumonia vaccine.  **Flu shots are available--- please call and schedule a FLU-CLINIC appointment**  After your visit with [place] today you will receive a survey in the mail or online from [employer] regarding your care with [place]. Please take a moment to fill this out. Your feedback is very important to [place] as you can help [place] better understand your patient needs as well as improve your  experience and satisfaction. WE CARE ABOUT YOU!!!   Your last colonoscopy was in January 2017.  That would place her next one if it is due in 3 years for January 2020. Please continue to do all you can to stop smoking Stay active physically and continue with current medicines pending results of lab work    Arrie Senate MD

## 2017-12-11 NOTE — Patient Instructions (Addendum)
Continue current medications. Continue good therapeutic lifestyle changes which include good diet and exercise. Fall precautions discussed with patient. If an FOBT was given today- please return it to our front desk. If you are over 61 years old - you may need Prevnar 30 or the adult Pneumonia vaccine.  **Flu shots are available--- please call and schedule a FLU-CLINIC appointment**  After your visit with [place] today you will receive a survey in the mail or online from [employer] regarding your care with [place]. Please take a moment to fill this out. Your feedback is very important to [place] as you can help [place] better understand your patient needs as well as improve your experience and satisfaction. WE CARE ABOUT YOU!!!   Your last colonoscopy was in January 2017.  That would place her next one if it is due in 3 years for January 2020. Please continue to do all you can to stop smoking Stay active physically and continue with current medicines pending results of lab work

## 2017-12-12 LAB — CBC WITH DIFFERENTIAL/PLATELET
Basophils Absolute: 0 10*3/uL (ref 0.0–0.2)
Basos: 0 %
EOS (ABSOLUTE): 0 10*3/uL (ref 0.0–0.4)
EOS: 0 %
HEMATOCRIT: 47.7 % (ref 37.5–51.0)
HEMOGLOBIN: 16.3 g/dL (ref 13.0–17.7)
IMMATURE GRANULOCYTES: 0 %
Immature Grans (Abs): 0 10*3/uL (ref 0.0–0.1)
LYMPHS ABS: 1.9 10*3/uL (ref 0.7–3.1)
Lymphs: 15 %
MCH: 30 pg (ref 26.6–33.0)
MCHC: 34.2 g/dL (ref 31.5–35.7)
MCV: 88 fL (ref 79–97)
MONOCYTES: 4 %
Monocytes Absolute: 0.5 10*3/uL (ref 0.1–0.9)
NEUTROS PCT: 81 %
Neutrophils Absolute: 10.2 10*3/uL — ABNORMAL HIGH (ref 1.4–7.0)
Platelets: 227 10*3/uL (ref 150–379)
RBC: 5.44 x10E6/uL (ref 4.14–5.80)
RDW: 14.5 % (ref 12.3–15.4)
WBC: 12.7 10*3/uL — AB (ref 3.4–10.8)

## 2017-12-12 LAB — PSA, TOTAL AND FREE
PROSTATE SPECIFIC AG, SERUM: 2.7 ng/mL (ref 0.0–4.0)
PSA FREE PCT: 18.5 %
PSA, Free: 0.5 ng/mL

## 2017-12-12 LAB — HEPATIC FUNCTION PANEL
ALT: 17 IU/L (ref 0–44)
AST: 14 IU/L (ref 0–40)
Albumin: 4.2 g/dL (ref 3.6–4.8)
Alkaline Phosphatase: 72 IU/L (ref 39–117)
Bilirubin Total: 0.2 mg/dL (ref 0.0–1.2)
Bilirubin, Direct: 0.08 mg/dL (ref 0.00–0.40)
Total Protein: 6.6 g/dL (ref 6.0–8.5)

## 2017-12-12 LAB — LIPID PANEL
CHOL/HDL RATIO: 2.5 ratio (ref 0.0–5.0)
Cholesterol, Total: 91 mg/dL — ABNORMAL LOW (ref 100–199)
HDL: 36 mg/dL — AB (ref >39)
LDL CALC: 38 mg/dL (ref 0–99)
TRIGLYCERIDES: 87 mg/dL (ref 0–149)
VLDL CHOLESTEROL CAL: 17 mg/dL (ref 5–40)

## 2017-12-12 LAB — BMP8+EGFR
BUN/Creatinine Ratio: 18 (ref 10–24)
BUN: 16 mg/dL (ref 8–27)
CO2: 24 mmol/L (ref 20–29)
CREATININE: 0.91 mg/dL (ref 0.76–1.27)
Calcium: 9.6 mg/dL (ref 8.6–10.2)
Chloride: 102 mmol/L (ref 96–106)
GFR calc Af Amer: 106 mL/min/{1.73_m2} (ref >59)
GFR, EST NON AFRICAN AMERICAN: 91 mL/min/{1.73_m2} (ref >59)
Glucose: 99 mg/dL (ref 65–99)
Potassium: 4.4 mmol/L (ref 3.5–5.2)
Sodium: 144 mmol/L (ref 134–144)

## 2017-12-12 LAB — VITAMIN D 25 HYDROXY (VIT D DEFICIENCY, FRACTURES): Vit D, 25-Hydroxy: 63.5 ng/mL (ref 30.0–100.0)

## 2018-01-14 ENCOUNTER — Other Ambulatory Visit: Payer: Self-pay | Admitting: Family Medicine

## 2018-01-14 MED FILL — PANTOPRAZOLE SOD DR 40 MG T: 40 | 90 days supply | Qty: 90 | Fill #0

## 2018-02-27 MED FILL — LOSARTAN POTASSIUM 50 MG TA: 50 | 90 days supply | Qty: 90 | Fill #1

## 2018-03-14 ENCOUNTER — Other Ambulatory Visit: Payer: Self-pay

## 2018-03-14 MED ORDER — ROSUVASTATIN CALCIUM 20 MG PO TABS: 20.0000 mg | ORAL_TABLET | Freq: Every day | ORAL | 2 refills | Status: DC

## 2018-03-14 MED FILL — ROSUVASTATIN CALCIUM 20 MG: 20 | 90 days supply | Qty: 90 | Fill #0

## 2018-05-01 ENCOUNTER — Ambulatory Visit: Payer: 59 | Admitting: Family Medicine

## 2018-05-01 ENCOUNTER — Encounter: Payer: Self-pay | Admitting: Family Medicine

## 2018-05-01 VITALS — BP 139/96 | HR 97 | Temp 97.8°F | Ht 72.0 in | Wt 155.0 lb

## 2018-05-01 DIAGNOSIS — Z86010 Personal history of colonic polyps: Secondary | ICD-10-CM | POA: Diagnosis not present

## 2018-05-01 DIAGNOSIS — I10 Essential (primary) hypertension: Secondary | ICD-10-CM | POA: Diagnosis not present

## 2018-05-01 DIAGNOSIS — K219 Gastro-esophageal reflux disease without esophagitis: Secondary | ICD-10-CM

## 2018-05-01 DIAGNOSIS — Z8249 Family history of ischemic heart disease and other diseases of the circulatory system: Secondary | ICD-10-CM

## 2018-05-01 DIAGNOSIS — E559 Vitamin D deficiency, unspecified: Secondary | ICD-10-CM | POA: Diagnosis not present

## 2018-05-01 DIAGNOSIS — M7711 Lateral epicondylitis, right elbow: Secondary | ICD-10-CM

## 2018-05-01 DIAGNOSIS — R030 Elevated blood-pressure reading, without diagnosis of hypertension: Secondary | ICD-10-CM

## 2018-05-01 DIAGNOSIS — E119 Type 2 diabetes mellitus without complications: Secondary | ICD-10-CM

## 2018-05-01 DIAGNOSIS — E7800 Pure hypercholesterolemia, unspecified: Secondary | ICD-10-CM | POA: Diagnosis not present

## 2018-05-01 LAB — BAYER DCA HB A1C WAIVED: HB A1C: 5.9 % (ref <7.0)

## 2018-05-01 MED ORDER — PANTOPRAZOLE SODIUM 40 MG PO TBEC: 40.0000 mg | DELAYED_RELEASE_TABLET | Freq: Every day | ORAL | 3 refills | Status: DC

## 2018-05-01 MED FILL — PANTOPRAZOLE SOD DR 40 MG T: 40 | 90 days supply | Qty: 90 | Fill #0

## 2018-05-01 NOTE — Addendum Note (Signed)
Addended by: Zannie Cove on: 05/01/2018 12:26 PM   Modules accepted: Orders

## 2018-05-01 NOTE — Progress Notes (Signed)
Subjective:    Patient ID: Bradley Goodman, male    DOB: [birth year], 62 y.o.   MRN: [id number]  HPI Pt here for follow up and management of chronic medical problems which includes hyperlipidemia and diabetes. He is taking medication regularly.  This is a regular follow-up visit for this patient who has hyperlipidemia and hypertension and reflux disease.  The blood pressure on the diastolic side was elevated on 2 different readings today.  He is currently on losartan 50 mg daily.  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  The patient has been hurting in the right lateral elbow and he is not sure how this was injured or irritating.  He denies any chest pain pressure tightness or shortness of breath.  He is active playing golf and working outside and lifting weights.  He denies any trouble with nausea vomiting diarrhea blood in the stool or black tarry bowel movements but does have occasional heartburn and takes Protonix for this.  He is due another colonoscopy early 2020 because of previous finding of polyps which would be 3 years after the last colonoscopy.  He is passing his water without problems and there is no erectile dysfunction issues.  His next eye exam comes up in September.  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    Patient Active Problem List   Diagnosis Date Noted  . Atypical chest pain 10/12/2016  . History of colonic polyps   . History of adenomatous polyp of colon 09/14/2015  . GERD (gastroesophageal reflux disease) 06/24/2013  . Diabetes mellitus type 2 controlled 06/24/2013  . Hyperlipidemia 06/24/2013  . Hypertension 06/24/2013   Outpatient Encounter Medications as of 05/01/2018  Medication Sig  . aspirin EC 81 MG tablet Take 81 mg by mouth every evening.  . cholecalciferol (VITAMIN D) 1000 UNITS tablet Take 2,000 Units by mouth every evening. Pt takes two  tablets daily  . Krill Oil 300 MG CAPS Take 1 capsule by mouth every evening.  . loratadine (CLARITIN) 10 MG tablet Take 10 mg by mouth every evening.   Marland Kitchen losartan (COZAAR) 50 MG tablet Take 1 tablet (50 mg total) by mouth daily.  . pantoprazole (PROTONIX) 40 MG tablet TAKE 1 TABLET BY MOUTH DAILY.  . rosuvastatin (CRESTOR) 20 MG tablet Take 1 tablet (20 mg total) by mouth daily.  . [DISCONTINUED] nicotine polacrilex (NICORETTE) 4 MG gum Take 1 each (4 mg total) by mouth as needed for smoking cessation.  . [DISCONTINUED] varenicline (CHANTIX CONTINUING MONTH PAK) 1 MG tablet Take 1 tablet (1 mg total) by mouth 2 (two) times daily.   No facility-administered encounter medications on file as of 05/01/2018.       Review of Systems  Constitutional: Negative.   HENT: Negative.   Eyes: Negative.   Respiratory: Negative.   Cardiovascular: Negative.   Gastrointestinal: Negative.   Endocrine: Negative.   Genitourinary: Negative.   Musculoskeletal: Positive for arthralgias (right elbow pain).  Skin: Negative.   Allergic/Immunologic: Negative.   Neurological: Negative.   Hematological: Negative.   Psychiatric/Behavioral: Negative.        Objective:   Physical Exam  Constitutional: He is oriented to person, place, and time. He appears well-developed and well-nourished. No distress.  The patient is pleasant and alert and is always somewhat uptight knees in the doctor's office.  HENT:  Head: Normocephalic and atraumatic.  Right Ear: External ear normal.  Left Ear: External ear normal.  Nose: Nose normal.  Mouth/Throat: Oropharynx is clear and moist. No oropharyngeal exudate.  Eyes: Pupils are equal, round, and reactive to light. Conjunctivae and EOM are normal. Right eye exhibits no discharge. Left eye exhibits no discharge. No scleral icterus.  Neck: Normal range of motion. Neck supple. No thyromegaly present.  No bruits thyromegaly or anterior cervical adenopathy  Cardiovascular:  Normal rate, regular rhythm, normal heart sounds and intact distal pulses.  No murmur heard. Heart is regular at 72/min  Pulmonary/Chest: Effort normal and breath sounds normal. He has no wheezes. He has no rales.  Clear anteriorly and posteriorly and no axillary adenopathy  Abdominal: Soft. Bowel sounds are normal. He exhibits no mass. There is no tenderness.  No abdominal masses tenderness organ enlargement or bruits  Musculoskeletal: Normal range of motion. He exhibits tenderness. He exhibits no edema.  Tender right lateral epicondyle  Lymphadenopathy:    He has no cervical adenopathy.  Neurological: He is alert and oriented to person, place, and time. He has normal reflexes. No cranial nerve deficit.  Skin: Skin is warm and dry. No rash noted.  Psychiatric: He has a normal mood and affect. His behavior is normal. Judgment and thought content normal.  Nursing note and vitals reviewed.  BP (!) 155/106 (BP Location: Left Arm)   Pulse 97   Temp 97.8 F (36.6 C) (Oral)   Ht 6' (1.829 m)   Wt 155 lb (70.3 kg)   BMI 21.02 kg/m         Assessment & Plan:  1. Pure hypercholesterolemia -Continue with current treatment and aggressive therapeutic lifestyle changes pending results of lab work - CBC with Differential/Platelet - Lipid panel  2. Gastroesophageal reflux disease, esophagitis presence not specified -Continue with Protonix and while taking the Aleve for short period of time take an additional Zantac before supper and take the Aleve after breakfast and supper for the next 10 to 14 days to help with right lateral epicondylitis - CBC with Differential/Platelet - Hepatic function panel  3. Essential hypertension -Watch sodium intake check blood pressures at home and bring readings by for review in 3 to 4 weeks - BMP8+EGFR - CBC with Differential/Platelet - Hepatic function panel  4. Type 2 diabetes mellitus without complication, without long-term current use of insulin  (HCC) -Patient typically controls his blood sugar with his diet and is currently on no medication and has not checked any blood sugars at home for a good while. - CBC with Differential/Platelet - Bayer DCA Hb A1c Waived  5. Vitamin D deficiency -Continue current treatment pending results of lab work - CBC with Differential/Platelet - VITAMIN D 25 Hydroxy (Vit-D Deficiency, Fractures)  6. Family history of heart disease -Continue with statin therapy and good blood pressure control - CBC with Differential/Platelet  7. Elevated blood pressure, situational -Watch sodium intake and bring readings by for review in 4 weeks.  Continue current treatment.  8. Right lateral epicondylitis -Elbow brace, warm wet compresses 20 minutes 3 or 4 times daily, avoid heavy lifting and strenuous activity with right elbow and take Aleve twice daily after breakfast and supper for 10 to 14 days while protecting stomach with Protonix and ranitidine.  9. History of adenomatous polyp of colon -Follow-up with gastroenterology as planned early 2020 4 repeat colonoscopy  Patient Instructions   Continue current medications. Continue good therapeutic lifestyle changes which include good diet and exercise. Fall precautions discussed with patient.  If an FOBT was given today- please return it to our front desk. If you are over 15 years old - you may need Prevnar 35 or the adult Pneumonia vaccine.  **Flu shots are available--- please call and schedule a FLU-CLINIC appointment**  After your visit with [place] today you will receive a survey in the mail or online from [employer] regarding your care with [place]. Please take a moment to fill this out. Your feedback is very important to [place] as you can help [place] better understand your patient needs as well as improve your experience and satisfaction. WE CARE ABOUT YOU!!!   Check blood pressure readings at home and bring these by for review in the next 3 to 4 weeks Watch salt  intake Take Aleve 1 twice daily after breakfast and supper for the next 10 to 14 days and continue to take Protonix before breakfast and take a Zantac before supper so this will help protect the stomach from getting any more irritated while taking the Aleve.  Wear the elbow brace on the right elbow and use warm wet compresses and avoid any heavy lifting pushing pulling for the next 7 to 10 days.  Arrie Senate MD

## 2018-05-01 NOTE — Patient Instructions (Addendum)
  Continue current medications. Continue good therapeutic lifestyle changes which include good diet and exercise. Fall precautions discussed with patient. If an FOBT was given today- please return it to our front desk. If you are over 61 years old - you may need Prevnar 27 or the adult Pneumonia vaccine.  **Flu shots are available--- please call and schedule a FLU-CLINIC appointment**  After your visit with [place] today you will receive a survey in the mail or online from [employer] regarding your care with [place]. Please take a moment to fill this out. Your feedback is very important to [place] as you can help [place] better understand your patient needs as well as improve your experience and satisfaction. WE CARE ABOUT YOU!!!   Check blood pressure readings at home and bring these by for review in the next 3 to 4 weeks Watch salt intake Take Aleve 1 twice daily after breakfast and supper for the next 10 to 14 days and continue to take Protonix before breakfast and take a Zantac before supper so this will help protect the stomach from getting any more irritated while taking the Aleve.  Wear the elbow brace on the right elbow and use warm wet compresses and avoid any heavy lifting pushing pulling for the next 7 to 10 days.

## 2018-05-02 LAB — HEPATIC FUNCTION PANEL
ALT: 17 IU/L (ref 0–44)
AST: 13 IU/L (ref 0–40)
Albumin: 4 g/dL (ref 3.6–4.8)
Alkaline Phosphatase: 69 IU/L (ref 39–117)
BILIRUBIN, DIRECT: 0.07 mg/dL (ref 0.00–0.40)
Bilirubin Total: 0.2 mg/dL (ref 0.0–1.2)
TOTAL PROTEIN: 6.2 g/dL (ref 6.0–8.5)

## 2018-05-02 LAB — BMP8+EGFR
BUN / CREAT RATIO: 13 (ref 10–24)
BUN: 13 mg/dL (ref 8–27)
CO2: 26 mmol/L (ref 20–29)
Calcium: 9.4 mg/dL (ref 8.6–10.2)
Chloride: 105 mmol/L (ref 96–106)
Creatinine, Ser: 1 mg/dL (ref 0.76–1.27)
GFR, EST AFRICAN AMERICAN: 94 mL/min/{1.73_m2} (ref >59)
GFR, EST NON AFRICAN AMERICAN: 81 mL/min/{1.73_m2} (ref >59)
GLUCOSE: 102 mg/dL — AB (ref 65–99)
POTASSIUM: 5.3 mmol/L — AB (ref 3.5–5.2)
SODIUM: 144 mmol/L (ref 134–144)

## 2018-05-02 LAB — CBC WITH DIFFERENTIAL/PLATELET
Basophils Absolute: 0.1 10*3/uL (ref 0.0–0.2)
Basos: 1 %
EOS (ABSOLUTE): 0 10*3/uL (ref 0.0–0.4)
Eos: 1 %
Hematocrit: 47.6 % (ref 37.5–51.0)
Hemoglobin: 15.5 g/dL (ref 13.0–17.7)
Immature Grans (Abs): 0 10*3/uL (ref 0.0–0.1)
Immature Granulocytes: 0 %
Lymphocytes Absolute: 1.9 10*3/uL (ref 0.7–3.1)
Lymphs: 23 %
MCH: 29.1 pg (ref 26.6–33.0)
MCHC: 32.6 g/dL (ref 31.5–35.7)
MCV: 90 fL (ref 79–97)
MONOS ABS: 0.5 10*3/uL (ref 0.1–0.9)
Monocytes: 7 %
NEUTROS ABS: 5.6 10*3/uL (ref 1.4–7.0)
Neutrophils: 68 %
PLATELETS: 219 10*3/uL (ref 150–450)
RBC: 5.32 x10E6/uL (ref 4.14–5.80)
RDW: 13.2 % (ref 12.3–15.4)
WBC: 8.2 10*3/uL (ref 3.4–10.8)

## 2018-05-02 LAB — LIPID PANEL
Chol/HDL Ratio: 2.3 ratio (ref 0.0–5.0)
Cholesterol, Total: 83 mg/dL — ABNORMAL LOW (ref 100–199)
HDL: 36 mg/dL — ABNORMAL LOW (ref >39)
LDL Calculated: 38 mg/dL (ref 0–99)
Triglycerides: 43 mg/dL (ref 0–149)
VLDL Cholesterol Cal: 9 mg/dL (ref 5–40)

## 2018-05-02 LAB — VITAMIN D 25 HYDROXY (VIT D DEFICIENCY, FRACTURES): VIT D 25 HYDROXY: 74.5 ng/mL (ref 30.0–100.0)

## 2018-06-26 MED FILL — ROSUVASTATIN CALCIUM 20 MG: 20 | 90 days supply | Qty: 90 | Fill #1

## 2018-07-22 DIAGNOSIS — H5211 Myopia, right eye: Secondary | ICD-10-CM | POA: Diagnosis not present

## 2018-07-22 DIAGNOSIS — H52223 Regular astigmatism, bilateral: Secondary | ICD-10-CM | POA: Diagnosis not present

## 2018-07-22 DIAGNOSIS — H5202 Hypermetropia, left eye: Secondary | ICD-10-CM | POA: Diagnosis not present

## 2018-07-22 DIAGNOSIS — H524 Presbyopia: Secondary | ICD-10-CM | POA: Diagnosis not present

## 2018-07-22 LAB — HM DIABETES EYE EXAM

## 2018-08-07 MED FILL — PANTOPRAZOLE SOD DR 40 MG T: 40 | 90 days supply | Qty: 90 | Fill #1

## 2018-09-18 ENCOUNTER — Ambulatory Visit: Payer: 59 | Admitting: Family Medicine

## 2018-09-18 ENCOUNTER — Encounter: Payer: Self-pay | Admitting: Family Medicine

## 2018-09-18 VITALS — BP 144/98 | HR 105 | Temp 97.0°F | Ht 72.0 in | Wt 157.0 lb

## 2018-09-18 DIAGNOSIS — E119 Type 2 diabetes mellitus without complications: Secondary | ICD-10-CM | POA: Diagnosis not present

## 2018-09-18 DIAGNOSIS — I152 Hypertension secondary to endocrine disorders: Secondary | ICD-10-CM

## 2018-09-18 DIAGNOSIS — E1159 Type 2 diabetes mellitus with other circulatory complications: Secondary | ICD-10-CM

## 2018-09-18 DIAGNOSIS — E559 Vitamin D deficiency, unspecified: Secondary | ICD-10-CM

## 2018-09-18 DIAGNOSIS — F320 Major depressive disorder, single episode, mild: Secondary | ICD-10-CM

## 2018-09-18 DIAGNOSIS — R5383 Other fatigue: Secondary | ICD-10-CM

## 2018-09-18 DIAGNOSIS — E7800 Pure hypercholesterolemia, unspecified: Secondary | ICD-10-CM

## 2018-09-18 DIAGNOSIS — K219 Gastro-esophageal reflux disease without esophagitis: Secondary | ICD-10-CM

## 2018-09-18 DIAGNOSIS — Z8249 Family history of ischemic heart disease and other diseases of the circulatory system: Secondary | ICD-10-CM

## 2018-09-18 DIAGNOSIS — R030 Elevated blood-pressure reading, without diagnosis of hypertension: Secondary | ICD-10-CM | POA: Diagnosis not present

## 2018-09-18 DIAGNOSIS — I10 Essential (primary) hypertension: Secondary | ICD-10-CM

## 2018-09-18 MED ORDER — VENLAFAXINE HCL ER 37.5 MG PO CP24: 37.5000 mg | ORAL_CAPSULE | Freq: Every day | ORAL | 0 refills | Status: DC

## 2018-09-18 MED ORDER — LOSARTAN POTASSIUM 50 MG PO TABS: 50.0000 mg | ORAL_TABLET | Freq: Every day | ORAL | 3 refills | Status: DC

## 2018-09-18 MED FILL — LOSARTAN POTASSIUM 50 MG TA: 50 | 90 days supply | Qty: 90 | Fill #0

## 2018-09-18 MED FILL — VENLAFAXINE HCL ER 37.5 MG: 37.5 | 90 days supply | Qty: 90 | Fill #0
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
## 2018-09-18 NOTE — Addendum Note (Signed)
Addended by: Zannie Cove on: 09/18/2018 10:46 AM   Modules accepted: Orders

## 2018-09-18 NOTE — Patient Instructions (Addendum)
Continue current medications. Continue good therapeutic lifestyle changes which include good diet and exercise. Fall precautions discussed with patient. If an FOBT was given today- please return it to our front desk. If you are over 61 years old - you may need Prevnar 61 or the adult Pneumonia vaccine.  **Flu shots are available--- please call and schedule a FLU-CLINIC appointment**  After your visit with [place] today you will receive a survey in the mail or online from [employer] regarding your care with [place]. Please take a moment to fill this out. Your feedback is very important to [place] as you can help [place] better understand your patient needs as well as improve your experience and satisfaction. WE CARE ABOUT YOU!!!   We will ask you to start Effexor extended release 37.5 and take 1 daily Increase the losartan to 50 mg daily and check blood pressure readings at home Come by the office in about 4 weeks so we can see if we need to increase the Effexor more and bring readings from home and we will recheck the blood pressure here. Continue to watch sodium intake and drink plenty of water and fluids The patient should continue to make all efforts to stop smoking completely.

## 2018-09-18 NOTE — Addendum Note (Signed)
Addended by: Zannie Cove on: 09/18/2018 11:08 AM   Modules accepted: Orders

## 2018-09-19 LAB — BAYER DCA HB A1C WAIVED: HB A1C (BAYER DCA - WAIVED): 5.7 % (ref <7.0)

## 2018-09-23 ENCOUNTER — Other Ambulatory Visit: Payer: Self-pay | Admitting: *Deleted

## 2018-09-23 DIAGNOSIS — E875 Hyperkalemia: Secondary | ICD-10-CM

## 2018-09-23 LAB — LIPID PANEL
CHOL/HDL RATIO: 2.4 ratio (ref 0.0–5.0)
CHOLESTEROL TOTAL: 95 mg/dL — AB (ref 100–199)
HDL: 40 mg/dL (ref >39)
LDL CALC: 43 mg/dL (ref 0–99)
Triglycerides: 62 mg/dL (ref 0–149)
VLDL CHOLESTEROL CAL: 12 mg/dL (ref 5–40)

## 2018-09-23 LAB — CBC WITH DIFFERENTIAL/PLATELET
BASOS ABS: 0.1 10*3/uL (ref 0.0–0.2)
BASOS: 1 %
EOS (ABSOLUTE): 0.1 10*3/uL (ref 0.0–0.4)
Eos: 1 %
HEMOGLOBIN: 15.5 g/dL (ref 13.0–17.7)
Hematocrit: 44.9 % (ref 37.5–51.0)
IMMATURE GRANS (ABS): 0 10*3/uL (ref 0.0–0.1)
Immature Granulocytes: 0 %
LYMPHS ABS: 1.7 10*3/uL (ref 0.7–3.1)
LYMPHS: 19 %
MCH: 29.7 pg (ref 26.6–33.0)
MCHC: 34.5 g/dL (ref 31.5–35.7)
MCV: 86 fL (ref 79–97)
Monocytes Absolute: 0.5 10*3/uL (ref 0.1–0.9)
Monocytes: 5 %
NEUTROS ABS: 6.7 10*3/uL (ref 1.4–7.0)
Neutrophils: 74 %
Platelets: 217 10*3/uL (ref 150–450)
RBC: 5.22 x10E6/uL (ref 4.14–5.80)
RDW: 13 % (ref 12.3–15.4)
WBC: 9 10*3/uL (ref 3.4–10.8)

## 2018-09-23 LAB — BMP8+EGFR
BUN / CREAT RATIO: 15 (ref 10–24)
BUN: 14 mg/dL (ref 8–27)
CALCIUM: 9.9 mg/dL (ref 8.6–10.2)
CHLORIDE: 105 mmol/L (ref 96–106)
CO2: 23 mmol/L (ref 20–29)
Creatinine, Ser: 0.95 mg/dL (ref 0.76–1.27)
GFR, EST AFRICAN AMERICAN: 99 mL/min/{1.73_m2} (ref >59)
GFR, EST NON AFRICAN AMERICAN: 86 mL/min/{1.73_m2} (ref >59)
Glucose: 94 mg/dL (ref 65–99)
POTASSIUM: 5.5 mmol/L — AB (ref 3.5–5.2)
Sodium: 146 mmol/L — ABNORMAL HIGH (ref 134–144)

## 2018-09-23 LAB — HEPATIC FUNCTION PANEL
ALT: 15 IU/L (ref 0–44)
AST: 15 IU/L (ref 0–40)
Albumin: 4.3 g/dL (ref 3.6–4.8)
Alkaline Phosphatase: 75 IU/L (ref 39–117)
Bilirubin, Direct: 0.08 mg/dL (ref 0.00–0.40)
Total Protein: 6.6 g/dL (ref 6.0–8.5)

## 2018-09-23 LAB — VITAMIN B12: Vitamin B-12: 514 pg/mL (ref 232–1245)

## 2018-09-23 LAB — MAGNESIUM: Magnesium: 2 mg/dL (ref 1.6–2.3)

## 2018-09-23 LAB — VITAMIN D 25 HYDROXY (VIT D DEFICIENCY, FRACTURES): Vit D, 25-Hydroxy: 69.5 ng/mL (ref 30.0–100.0)

## 2018-09-23 LAB — THYROID PANEL WITH TSH
Free Thyroxine Index: 3.1 (ref 1.2–4.9)
T3 Uptake Ratio: 29 % (ref 24–39)
T4, Total: 10.8 ug/dL (ref 4.5–12.0)
TSH: 2.11 u[IU]/mL (ref 0.450–4.500)

## 2018-09-25 ENCOUNTER — Other Ambulatory Visit: Payer: 59

## 2018-09-25 ENCOUNTER — Telehealth: Payer: Self-pay | Admitting: *Deleted

## 2018-09-25 DIAGNOSIS — E875 Hyperkalemia: Secondary | ICD-10-CM | POA: Diagnosis not present

## 2018-09-25 NOTE — Telephone Encounter (Signed)
-----   Message from Chipper Herb, MD sent at 09/25/2018  8:42 AM EST ----- The hemoglobin A1c was 5.7%.  This means that his average blood sugar over the past 3 months has been controlled with his diet in the prediabetic range.  The last A1c was 5.9%.  Continue with aggressive therapeutic lifestyle changes including diet and exercise.  Please have this patient come to the visit with his wife when his wife meets with Sharyn Lull

## 2018-09-26 LAB — BMP8+EGFR
BUN/Creatinine Ratio: 16 (ref 10–24)
BUN: 16 mg/dL (ref 8–27)
CO2: 24 mmol/L (ref 20–29)
Calcium: 9.9 mg/dL (ref 8.6–10.2)
Chloride: 102 mmol/L (ref 96–106)
Creatinine, Ser: 0.98 mg/dL (ref 0.76–1.27)
GFR calc Af Amer: 96 mL/min/{1.73_m2} (ref >59)
GFR calc non Af Amer: 83 mL/min/{1.73_m2} (ref >59)
Glucose: 108 mg/dL — ABNORMAL HIGH (ref 65–99)
Potassium: 4.8 mmol/L (ref 3.5–5.2)
SODIUM: 141 mmol/L (ref 134–144)

## 2018-09-29 ENCOUNTER — Encounter: Payer: Self-pay | Admitting: Internal Medicine

## 2018-10-15 MED FILL — ROSUVASTATIN CALCIUM 20 MG: 20 | 90 days supply | Qty: 90 | Fill #2

## 2018-10-21 ENCOUNTER — Encounter: Payer: Self-pay | Admitting: Family Medicine

## 2018-10-21 ENCOUNTER — Ambulatory Visit: Payer: 59 | Admitting: Family Medicine

## 2018-10-21 VITALS — BP 150/100 | HR 102 | Temp 97.3°F | Ht 72.0 in | Wt 156.0 lb

## 2018-10-21 DIAGNOSIS — I10 Essential (primary) hypertension: Secondary | ICD-10-CM | POA: Diagnosis not present

## 2018-10-21 DIAGNOSIS — F320 Major depressive disorder, single episode, mild: Secondary | ICD-10-CM | POA: Diagnosis not present

## 2018-10-21 DIAGNOSIS — Z720 Tobacco use: Secondary | ICD-10-CM

## 2018-10-21 NOTE — Addendum Note (Signed)
Addended by: Zannie Cove on: 10/21/2018 08:24 AM   Modules accepted: Orders

## 2018-10-21 NOTE — Progress Notes (Signed)
Subjective:    Patient ID: Bradley Goodman, male    DOB: [birth year], 63 y.o.   MRN: [id number]  HPI  Pt here for 4 week follow up on depression meds and hypertension.  Initial blood pressure reading this morning was 157/107 on repeat it was 142/92.  Blood pressures at work have been running anywhere and that upper 70s to the mid 54Y for the diastolic readings and anywhere from 1 20-1 34 for the systolic readings.. Patient today says the Effexor is helping his depression and he is now lifting weights and becoming more active. He is still smoking unfortunately and says he will continue to make all efforts to stop smoking.  This and also would encourage his wife to stop smoking.  Patient today denies any chest pain pressure tightness dizziness or headache.  He is currently taking losartan 50 mg daily but has a lot of the strength of this pill at home.    Patient Active Problem List   Diagnosis Date Noted  . Atypical chest pain 10/12/2016  . History of colonic polyps   . History of adenomatous polyp of colon 09/14/2015  . GERD (gastroesophageal reflux disease) 06/24/2013  . Diabetes mellitus type 2 controlled 06/24/2013  . Hyperlipidemia 06/24/2013  . Hypertension 06/24/2013   Outpatient Encounter Medications as of 10/21/2018  Medication Sig  . aspirin EC 81 MG tablet Take 81 mg by mouth every evening.  . cholecalciferol (VITAMIN D) 1000 UNITS tablet Take 2,000 Units by mouth every evening. Pt takes two tablets daily  . Krill Oil 300 MG CAPS Take 1 capsule by mouth every evening.  . loratadine (CLARITIN) 10 MG tablet Take 10 mg by mouth every evening.   Marland Kitchen losartan (COZAAR) 50 MG tablet Take 1 tablet (50 mg total) by mouth daily.  . pantoprazole (PROTONIX) 40 MG tablet Take 1 tablet (40 mg total) by mouth daily.  . rosuvastatin (CRESTOR) 20 MG tablet Take 1 tablet (20 mg total) by mouth daily.  Marland Kitchen venlafaxine XR (EFFEXOR XR) 37.5 MG 24 hr capsule Take 1 capsule (37.5 mg total) by mouth daily  with breakfast.   No facility-administered encounter medications on file as of 10/21/2018.      Review of Systems  Constitutional: Negative.   HENT: Negative.   Eyes: Negative.   Respiratory: Negative.   Cardiovascular: Negative.   Gastrointestinal: Negative.   Endocrine: Negative.   Genitourinary: Negative.   Musculoskeletal: Negative.   Skin: Negative.   Allergic/Immunologic: Negative.   Neurological: Negative.   Hematological: Negative.   Psychiatric/Behavioral: Negative.        Objective:   Physical Exam Vitals signs and nursing note reviewed.  Constitutional:      Appearance: Normal appearance. He is well-developed and normal weight. He is not ill-appearing.     Comments: Patient is pleasant and alert and comes to the visit today with his wife.  HENT:     Head: Normocephalic and atraumatic.     Right Ear: External ear normal.     Left Ear: External ear normal.     Nose: Nose normal. No congestion.  Eyes:     General: No scleral icterus.       Right eye: No discharge.        Left eye: No discharge.     Conjunctiva/sclera: Conjunctivae normal.     Pupils: Pupils are equal, round, and reactive to light.  Neck:     Musculoskeletal: Normal range of motion.     Thyroid: No  thyromegaly.     Trachea: No tracheal deviation.  Cardiovascular:     Rate and Rhythm: Normal rate and regular rhythm.     Heart sounds: Normal heart sounds. No murmur.  Pulmonary:     Effort: Pulmonary effort is normal. No respiratory distress.     Breath sounds: Wheezing present. No rales.     Comments: Clear anteriorly and posteriorly except a few scattered wheezes. Abdominal:     Palpations: Abdomen is soft.  Musculoskeletal: Normal range of motion.        General: No tenderness.     Right lower leg: No edema.     Left lower leg: No edema.  Skin:    General: Skin is warm and dry.  Neurological:     General: No focal deficit present.     Mental Status: He is alert and oriented to  person, place, and time.     Deep Tendon Reflexes: Reflexes are normal and symmetric.  Psychiatric:        Mood and Affect: Mood normal.        Behavior: Behavior normal.        Thought Content: Thought content normal.        Judgment: Judgment normal.     Comments: Mood affect and behavior are laid back and stable for this patient he does seem to be more upbeat as far as the effect of the Effexor that he has been taking.    BP (!) 157/107 (BP Location: Left Arm)   Pulse 99   Temp (!) 97.3 F (36.3 C) (Oral)   Ht 6' (1.829 m)   Wt 156 lb (70.8 kg)   BMI 21.16 kg/m         Assessment & Plan:  1. Essential hypertension -Blood pressure still running high with a third reading today with the patient sitting on the table being 150/100 in the left arm.  He has losartan 50 mg at home and will increase this to 100 mg daily.  He will come by the office in a couple of weeks have the nurse recheck blood pressure here get a BMP and bring twice daily readings from home if possible at that time for review.  2. Current mild episode of major depressive disorder without prior episode ([place]) -Continue with Effexor  3. Nicotine abuse -Continue to make all efforts to stop smoking completely  Patient Instructions  Continue with Effexor Continue to watch sodium intake Increase losartan to 100 mg daily Check blood pressure readings at home twice daily and bring readings to the office in a couple weeks and have nurse check blood pressure here and repeat BMP at that time  Arrie Senate MD

## 2018-10-21 NOTE — Patient Instructions (Signed)
Continue with Effexor Continue to watch sodium intake Increase losartan to 100 mg daily Check blood pressure readings at home twice daily and bring readings to the office in a couple weeks and have nurse check blood pressure here and repeat BMP at that time

## 2018-11-04 ENCOUNTER — Ambulatory Visit: Payer: 59 | Admitting: *Deleted

## 2018-11-04 DIAGNOSIS — I10 Essential (primary) hypertension: Secondary | ICD-10-CM | POA: Diagnosis not present

## 2018-11-04 NOTE — Progress Notes (Signed)
Pt here for BP ck BP 137 82 P 89

## 2018-11-05 LAB — BMP8+EGFR
BUN/Creatinine Ratio: 18 (ref 10–24)
BUN: 16 mg/dL (ref 8–27)
CALCIUM: 9.4 mg/dL (ref 8.6–10.2)
CO2: 23 mmol/L (ref 20–29)
CREATININE: 0.89 mg/dL (ref 0.76–1.27)
Chloride: 102 mmol/L (ref 96–106)
GFR calc Af Amer: 107 mL/min/{1.73_m2} (ref >59)
GFR calc non Af Amer: 92 mL/min/{1.73_m2} (ref >59)
Glucose: 97 mg/dL (ref 65–99)
Potassium: 4.5 mmol/L (ref 3.5–5.2)
Sodium: 142 mmol/L (ref 134–144)

## 2018-11-13 ENCOUNTER — Encounter: Payer: Self-pay | Admitting: *Deleted

## 2018-11-27 MED FILL — PANTOPRAZOLE SOD DR 40 MG T: 40 | 90 days supply | Qty: 90 | Fill #2

## 2018-12-01 MED FILL — LOSARTAN POTASSIUM 50 MG TA: 50 | 90 days supply | Qty: 90 | Fill #1

## 2018-12-18 ENCOUNTER — Other Ambulatory Visit: Payer: Self-pay | Admitting: Family Medicine

## 2018-12-18 MED FILL — VENLAFAXINE HCL ER 37.5 MG: 37.5 | 90 days supply | Qty: 90 | Fill #0

## 2019-01-12 ENCOUNTER — Other Ambulatory Visit: Payer: Self-pay | Admitting: Family Medicine

## 2019-01-12 MED FILL — ROSUVASTATIN CALCIUM 20 MG: 20 | 90 days supply | Qty: 90 | Fill #0

## 2019-01-21 ENCOUNTER — Ambulatory Visit (INDEPENDENT_AMBULATORY_CARE_PROVIDER_SITE_OTHER): Payer: 59 | Admitting: Family Medicine

## 2019-01-21 ENCOUNTER — Other Ambulatory Visit: Payer: Self-pay

## 2019-01-21 DIAGNOSIS — E559 Vitamin D deficiency, unspecified: Secondary | ICD-10-CM

## 2019-01-21 DIAGNOSIS — I10 Essential (primary) hypertension: Secondary | ICD-10-CM

## 2019-01-21 DIAGNOSIS — E1159 Type 2 diabetes mellitus with other circulatory complications: Secondary | ICD-10-CM | POA: Diagnosis not present

## 2019-01-21 DIAGNOSIS — I152 Hypertension secondary to endocrine disorders: Secondary | ICD-10-CM

## 2019-01-21 DIAGNOSIS — E7800 Pure hypercholesterolemia, unspecified: Secondary | ICD-10-CM

## 2019-01-21 DIAGNOSIS — R030 Elevated blood-pressure reading, without diagnosis of hypertension: Secondary | ICD-10-CM | POA: Diagnosis not present

## 2019-01-21 DIAGNOSIS — K219 Gastro-esophageal reflux disease without esophagitis: Secondary | ICD-10-CM | POA: Diagnosis not present

## 2019-01-21 DIAGNOSIS — Z8249 Family history of ischemic heart disease and other diseases of the circulatory system: Secondary | ICD-10-CM | POA: Diagnosis not present

## 2019-01-21 NOTE — Patient Instructions (Signed)
Continue current meds and therapeutic lifestyle changes Do not forget to get your colonoscopy once the COVID-19 crisis situation begins to resolve Get your eye exam as planned in September Check blood pressures regularly Watch salt intake closely Continue to practice good hand and pulmonary hygiene

## 2019-01-21 NOTE — Progress Notes (Signed)
Virtual Visit Via telephone Note I connected with@ on 01/21/19 by telephone and verified that I am speaking with the correct person or authorized healthcare agent using two identifiers. Bradley Goodman is currently located at home and there are no unauthorized people in close proximity. I completed this visit while in a private location in my home .  I connected to him by telephone and verified that I am speaking with the correct person.  This visit type was conducted due to national recommendations for restrictions regarding the COVID-19 Pandemic (e.g. social distancing).  This format is felt to be most appropriate for this patient at this time.  All issues noted in this document were discussed and addressed.  No physical exam was performed.    I discussed the limitations, risks, security and privacy concerns of performing an evaluation and management service by telephone and the availability of in person appointments. I also discussed with the patient that there may be a patient responsible charge related to this service. The patient expressed understanding and agreed to proceed.   Date:  01/21/2019    ID:  Bradley Goodman      [birth year]        [id number]   Patient Care Team Patient Care Team: Bradley Herb, MD as PCP - General (Family Medicine) Bradley Romney Cristopher Estimable, MD as Consulting Physician (Gastroenterology)  Reason for Visit: Primary Care Follow-up     History of Present Illness & Review of Systems:     Bradley Goodman is a 63 y.o. year old male primary care patient that presents today for a telehealth visit.  This patient is doing well overall and typically has few complaints.  We have had trouble with his blood pressure recently and his medication has been increased.  He also has a history of GERD type 2 diabetes mellitus that is controlled with diet hyperlipidemia adenomatous polyps and family history of heart disease.  The patient today denies any chest pain pressure tightness  or shortness of breath.  He denies any trouble with swallowing heartburn indigestion nausea vomiting diarrhea or blood in the stool.  He is due to have a repeat colonoscopy but this is been postponed due to COVID-19 and he will get this done as soon as possible with Dr. Gala Romney.  This was because of multiple colon polyps.  He is passing his water without problems and is up-to-date on his eye exams.  He says he does not check blood sugars regularly at home.  He currently does not take any medicines for his blood sugar.  His allergies and medications were reviewed and no refills are needed at this time.  As far as his losartan he is taking 75 mg daily or 1-1/2 of the 50 mg pills.  Review of systems as stated otherwise negative for body systems left unmentioned.   The patient does not have symptoms concerning for COVID-19 infection (fever, chills, cough, or new shortness of breath).      Current Medications (Verified) Allergies as of 01/21/2019   No Known Allergies     Medication List       Accurate as of January 21, 2019  8:52 AM. Always use your most recent med list.        aspirin EC 81 MG tablet Take 81 mg by mouth every evening.   cholecalciferol 1000 units tablet Commonly known as:  VITAMIN D Take 2,000 Units by mouth every evening. Pt takes two tablets daily  Krill Oil 300 MG Caps Take 1 capsule by mouth every evening.   loratadine 10 MG tablet Commonly known as:  CLARITIN Take 10 mg by mouth every evening.   losartan 50 MG tablet Commonly known as:  COZAAR Take 1 tablet (50 mg total) by mouth daily.   pantoprazole 40 MG tablet Commonly known as:  PROTONIX Take 1 tablet (40 mg total) by mouth daily.   rosuvastatin 20 MG tablet Commonly known as:  CRESTOR TAKE 1 TABLET (20 MG TOTAL) BY MOUTH DAILY.   venlafaxine XR 37.5 MG 24 hr capsule Commonly known as:  EFFEXOR-XR TAKE 1 CAPSULE BY MOUTH DAILY WITH BREAKFAST           Allergies (Verified)    Patient has no  known allergies.  Past Medical History Past Medical History:  Diagnosis Date  . Cervical spondylolysis   . Essential hypertension   . GERD (gastroesophageal reflux disease)   . History of migraine   . Hyperlipidemia   . Type 2 diabetes mellitus (Bradley Goodman)      Past Surgical History:  Procedure Laterality Date  . Bilateral inguinal hernia repair    . COLONOSCOPY  01/23/2012   Procedure: COLONOSCOPY;  Surgeon: Bradley Dolin, MD;  Location: [facility];  Service: Endoscopy;  Laterality: N/A;  9:30 AM  . COLONOSCOPY N/A 10/11/2015   Procedure: COLONOSCOPY;  Surgeon: Bradley Dolin, MD;  Location: [facility];  Service: Endoscopy;  Laterality: N/A;  830    Social History   Socioeconomic History  . Marital status: Married    Spouse name: Not on file  . Number of children: Not on file  . Years of education: Not on file  . Highest education level: Not on file  Occupational History  . Not on file  Social Needs  . Financial resource strain: Not on file  . Food insecurity:    Worry: Not on file    Inability: Not on file  . Transportation needs:    Medical: Not on file    Non-medical: Not on file  Tobacco Use  . Smoking status: Current Every Day Smoker    Packs/day: 1.00    Years: 30.00    Pack years: 30.00    Types: Cigarettes    Start date: 10/30/1973  . Smokeless tobacco: Never Used  Substance and Sexual Activity  . Alcohol use: Yes    Alcohol/week: 0.0 standard drinks    Comment: One drink every 6 months.  . Drug use: No  . Sexual activity: Not on file  Lifestyle  . Physical activity:    Days per week: Not on file    Minutes per session: Not on file  . Stress: Not on file  Relationships  . Social connections:    Talks on phone: Not on file    Gets together: Not on file    Attends religious service: Not on file    Active member of club or organization: Not on file    Attends meetings of clubs or organizations: Not on file    Relationship status: Not on file   Other Topics Concern  . Not on file  Social History Narrative  . Not on file     Family History  Problem Relation Age of Onset  . Heart attack Mother   . Heart attack Father   . Heart attack Brother   . Colon cancer Neg Hx       Labs/Other Tests and Data Reviewed:  Wt Readings from Last 3 Encounters:  10/21/18 156 lb (70.8 kg)  09/18/18 157 lb (71.2 kg)  05/01/18 155 lb (70.3 kg)   Temp Readings from Last 3 Encounters:  10/21/18 (!) 97.3 F (36.3 C) (Oral)  09/18/18 (!) 97 F (36.1 C) (Oral)  05/01/18 97.8 F (36.6 C) (Oral)   BP Readings from Last 3 Encounters:  11/04/18 137/82  10/21/18 (!) 150/100  09/18/18 (!) 144/98   Pulse Readings from Last 3 Encounters:  11/04/18 89  10/21/18 (!) 102  09/18/18 (!) 105     Lab Results  Component Value Date   HGBA1C 5.7 09/18/2018   HGBA1C 5.9 05/01/2018   HGBA1C 5.2 12/11/2017   Lab Results  Component Value Date   MICROALBUR neg 07/13/2014   LDLCALC 43 09/18/2018   CREATININE 0.89 11/04/2018       Chemistry      Component Value Date/Time   NA 142 11/04/2018 0922   K 4.5 11/04/2018 0922   CL 102 11/04/2018 0922   CO2 23 11/04/2018 0922   BUN 16 11/04/2018 0922   CREATININE 0.89 11/04/2018 0922      Component Value Date/Time   CALCIUM 9.4 11/04/2018 0922   ALKPHOS 75 09/18/2018 1018   AST 15 09/18/2018 1018   ALT 15 09/18/2018 1018   BILITOT <0.2 09/18/2018 1018         OBSERVATIONS/ OBJECTIVE:     The patient feels well and continues to work as an Therapist, sports at the hospital.  He has not had any exposure that he is aware of to COVID 19 patients.  His review of systems were basically negative.  He is due for colonoscopy and will get this done as soon as the current restrictions are lifted.  He gets his eye exam in September.  His blood pressures at home have been running well and this morning was 118/76.  He cannot say that he is felt any different since increasing the losartan from 50 to 75 mg.  His  blood pressures have been better.  He is staying active when he is not working and watching his diet more closely.  We will ask him to come by and get routine lab work.  Physical exam deferred due to nature of telephonic visit.  ASSESSMENT & PLAN    Time:   Today, I have spent 25 minutes with the patient via telephone discussing the above including Covid precautions.     Visit Diagnoses: 1. Essential hypertension -Continue with losartan 75 mg daily.  This would be 50 mg and taking 1-1/2 daily.  Patient needs routine lab work.  2. Gastroesophageal reflux disease, esophagitis presence not specified -He is doing well with this and will continue with Protonix.  He is due to get a colonoscopy repeat and this will be scheduled by the gastroenterologist as soon as current restrictions are lifted.  3. Hypertension associated with diabetes ([place]) -Patient does not check blood sugars at home because he has been watching his diet closely and his A1c's have been good.  He will continue with his current dose of losartan 75 mg daily  4. Vitamin D deficiency -Continue with vitamin D replacement pending results of lab work  5. Elevated blood pressure, situational -Blood pressures have improved with increased dose of losartan to 75 mg daily.  6. Pure hypercholesterolemia -Continue with Crestor and krill oil along with therapeutic lifestyle changes  7. Family history of heart disease -Stay active physically, stop smoking take statin meds and watch  diet closely  Patient Instructions  Continue current meds and therapeutic lifestyle changes Do not forget to get your colonoscopy once the COVID-19 crisis situation begins to resolve Get your eye exam as planned in September Check blood pressures regularly Watch salt intake closely Continue to practice good hand and pulmonary hygiene     The above assessment and management plan was discussed with the patient. The patient verbalized understanding of  and has agreed to the management plan. Patient is aware to call the clinic if symptoms persist or worsen. Patient is aware when to return to the clinic for a follow-up visit. Patient educated on when it is appropriate to go to the emergency department.    Bradley Herb, MD [facility] [address], [place], [place] [address] Ph [phone]   Arrie Senate MD

## 2019-02-09 ENCOUNTER — Telehealth: Payer: Self-pay | Admitting: Family Medicine

## 2019-02-09 MED ORDER — LOSARTAN POTASSIUM 25 MG PO TABS: 25.0000 mg | ORAL_TABLET | Freq: Every day | ORAL | 1 refills | Status: DC

## 2019-02-09 MED ORDER — LOSARTAN POTASSIUM 50 MG PO TABS: 50.0000 mg | ORAL_TABLET | Freq: Every day | ORAL | 1 refills | Status: DC

## 2019-02-09 MED FILL — LOSARTAN POTASSIUM 50 MG TA: 50 | 30 days supply | Qty: 45 | Fill #0

## 2019-02-09 NOTE — Telephone Encounter (Signed)
Refill sent patient aware  

## 2019-02-10 ENCOUNTER — Other Ambulatory Visit: Payer: Self-pay | Admitting: *Deleted

## 2019-02-10 MED ORDER — LOSARTAN POTASSIUM 50 MG PO TABS: 75.0000 mg | ORAL_TABLET | Freq: Every day | ORAL | 1 refills | Status: DC

## 2019-03-04 MED FILL — PANTOPRAZOLE SOD DR 40 MG T: 40 | 90 days supply | Qty: 90 | Fill #0

## 2019-03-05 MED FILL — LOSARTAN POTASSIUM 50 MG TA: 50 | 30 days supply | Qty: 45 | Fill #1

## 2019-03-19 ENCOUNTER — Other Ambulatory Visit: Payer: Self-pay | Admitting: Family Medicine

## 2019-03-20 MED FILL — VENLAFAXINE HCL ER 37.5 MG: 37.5 | 90 days supply | Qty: 90 | Fill #0

## 2019-04-10 MED FILL — LOSARTAN POTASSIUM 50 MG TA: 50 | 30 days supply | Qty: 45 | Fill #2

## 2019-04-21 MED FILL — ROSUVASTATIN CALCIUM 20 MG: 20 | 90 days supply | Qty: 90 | Fill #1

## 2019-05-15 MED FILL — LOSARTAN POTASSIUM 50 MG TA: 50 | 30 days supply | Qty: 45 | Fill #3

## 2019-06-25 ENCOUNTER — Other Ambulatory Visit: Payer: Self-pay | Admitting: Family Medicine

## 2019-06-25 MED FILL — PANTOPRAZOLE SOD DR 40 MG T: 40 | 90 days supply | Qty: 90 | Fill #0

## 2019-06-25 MED FILL — VENLAFAXINE HCL ER 37.5 MG: 37.5 | 90 days supply | Qty: 90 | Fill #0

## 2019-06-25 MED FILL — LOSARTAN POTASSIUM 50 MG TA: 50 | 30 days supply | Qty: 45 | Fill #4

## 2019-07-07 ENCOUNTER — Other Ambulatory Visit: Payer: Self-pay

## 2019-07-08 ENCOUNTER — Ambulatory Visit: Payer: 59 | Admitting: Family Medicine

## 2019-07-08 ENCOUNTER — Encounter: Payer: Self-pay | Admitting: Family Medicine

## 2019-07-08 VITALS — BP 115/77 | HR 95 | Temp 98.0°F | Resp 18 | Ht 72.0 in | Wt 158.0 lb

## 2019-07-08 DIAGNOSIS — E1169 Type 2 diabetes mellitus with other specified complication: Secondary | ICD-10-CM | POA: Diagnosis not present

## 2019-07-08 DIAGNOSIS — F339 Major depressive disorder, recurrent, unspecified: Secondary | ICD-10-CM | POA: Diagnosis not present

## 2019-07-08 DIAGNOSIS — I10 Essential (primary) hypertension: Secondary | ICD-10-CM | POA: Diagnosis not present

## 2019-07-08 DIAGNOSIS — E559 Vitamin D deficiency, unspecified: Secondary | ICD-10-CM | POA: Diagnosis not present

## 2019-07-08 DIAGNOSIS — E785 Hyperlipidemia, unspecified: Secondary | ICD-10-CM

## 2019-07-08 DIAGNOSIS — K219 Gastro-esophageal reflux disease without esophagitis: Secondary | ICD-10-CM | POA: Diagnosis not present

## 2019-07-08 DIAGNOSIS — E1159 Type 2 diabetes mellitus with other circulatory complications: Secondary | ICD-10-CM

## 2019-07-08 DIAGNOSIS — E119 Type 2 diabetes mellitus without complications: Secondary | ICD-10-CM | POA: Diagnosis not present

## 2019-07-08 DIAGNOSIS — I152 Hypertension secondary to endocrine disorders: Secondary | ICD-10-CM

## 2019-07-08 LAB — CBC WITH DIFFERENTIAL/PLATELET
Basophils Absolute: 0.1 10*3/uL (ref 0.0–0.2)
Basos: 1 %
EOS (ABSOLUTE): 0.1 10*3/uL (ref 0.0–0.4)
Eos: 1 %
Hematocrit: 49 % (ref 37.5–51.0)
Hemoglobin: 16.2 g/dL (ref 13.0–17.7)
Immature Grans (Abs): 0 10*3/uL (ref 0.0–0.1)
Immature Granulocytes: 0 %
Lymphocytes Absolute: 1.9 10*3/uL (ref 0.7–3.1)
Lymphs: 20 %
MCH: 29.6 pg (ref 26.6–33.0)
MCHC: 33.1 g/dL (ref 31.5–35.7)
MCV: 90 fL (ref 79–97)
Monocytes Absolute: 0.5 10*3/uL (ref 0.1–0.9)
Monocytes: 5 %
Neutrophils Absolute: 7.1 10*3/uL — ABNORMAL HIGH (ref 1.4–7.0)
Neutrophils: 73 %
Platelets: 222 10*3/uL (ref 150–450)
RBC: 5.47 x10E6/uL (ref 4.14–5.80)
RDW: 13.6 % (ref 11.6–15.4)
WBC: 9.7 10*3/uL (ref 3.4–10.8)

## 2019-07-08 LAB — CMP14+EGFR
ALT: 15 IU/L (ref 0–44)
AST: 12 IU/L (ref 0–40)
Albumin/Globulin Ratio: 2.2 (ref 1.2–2.2)
Albumin: 4.4 g/dL (ref 3.8–4.8)
Alkaline Phosphatase: 80 IU/L (ref 39–117)
BUN/Creatinine Ratio: 18 (ref 10–24)
BUN: 18 mg/dL (ref 8–27)
Bilirubin Total: 0.3 mg/dL (ref 0.0–1.2)
CO2: 24 mmol/L (ref 20–29)
Calcium: 9.9 mg/dL (ref 8.6–10.2)
Chloride: 103 mmol/L (ref 96–106)
Creatinine, Ser: 0.99 mg/dL (ref 0.76–1.27)
GFR calc Af Amer: 94 mL/min/{1.73_m2} (ref >59)
GFR calc non Af Amer: 81 mL/min/{1.73_m2} (ref >59)
Globulin, Total: 2 g/dL (ref 1.5–4.5)
Glucose: 107 mg/dL — ABNORMAL HIGH (ref 65–99)
Potassium: 4.7 mmol/L (ref 3.5–5.2)
Sodium: 142 mmol/L (ref 134–144)
Total Protein: 6.4 g/dL (ref 6.0–8.5)

## 2019-07-08 LAB — LIPID PANEL
Chol/HDL Ratio: 2.7 ratio (ref 0.0–5.0)
Cholesterol, Total: 85 mg/dL — ABNORMAL LOW (ref 100–199)
HDL: 32 mg/dL — ABNORMAL LOW (ref >39)
LDL Chol Calc (NIH): 29 mg/dL (ref 0–99)
Triglycerides: 139 mg/dL (ref 0–149)
VLDL Cholesterol Cal: 24 mg/dL (ref 5–40)

## 2019-07-08 LAB — BAYER DCA HB A1C WAIVED: HB A1C (BAYER DCA - WAIVED): 5.7 % (ref <7.0)

## 2019-07-08 MED ORDER — LOSARTAN POTASSIUM 50 MG PO TABS: 75.0000 mg | ORAL_TABLET | Freq: Every day | ORAL | 1 refills | Status: DC

## 2019-07-08 MED ORDER — ROSUVASTATIN CALCIUM 20 MG PO TABS: 20.0000 mg | ORAL_TABLET | Freq: Every day | ORAL | 3 refills | Status: DC

## 2019-07-08 MED ORDER — VENLAFAXINE HCL ER 37.5 MG PO CP24: ORAL_CAPSULE | ORAL | 3 refills | Status: DC

## 2019-07-08 MED ORDER — PANTOPRAZOLE SODIUM 40 MG PO TBEC: 40.0000 mg | DELAYED_RELEASE_TABLET | Freq: Every day | ORAL | 3 refills | Status: DC

## 2019-07-08 MED FILL — ROSUVASTATIN CALCIUM 20 MG: 20 | 90 days supply | Qty: 90 | Fill #0

## 2019-07-08 NOTE — Patient Instructions (Signed)
It was a pleasure seeing you today, Bradley Goodman.  Information regarding what we discussed is included in this packet.  Please make an appointment to see me in 3-4 months.   In a few days you may receive a survey in the mail or online from [employer] regarding your visit with [place] today. Please take a moment to fill this out. Your feedback is very important to our office. It can help [place] better understand your needs as well as improve your experience and satisfaction. Thank you for taking your time to complete it. We care about you.  Because of recent events of COVID-19 ("Coronavirus"), please follow CDC recommendations:   1. Wash your hand frequently 2. Avoid touching your face 3. Stay away from people who are sick 4. If you have symptoms such as fever, cough, shortness of breath then call your healthcare provider for further guidance 5. If you are sick, STAY AT HOME, unless otherwise directed by your healthcare provider. 6. Follow directions from state and national officials regarding staying safe    Please feel free to call our office if any questions or concerns arise.  Warm Regards, Monia Pouch, FNP-C [facility] [address] [place], [place] [address] [phone]

## 2019-07-08 NOTE — Progress Notes (Signed)
Subjective:  Patient ID: Bradley Goodman, male    DOB: 11-23-1955, 63 y.o.   MRN: [id number]  Patient Care Team: [name], FNP as PCP - General (Family Medicine) Gala Romney Cristopher Estimable, MD as Consulting Physician (Gastroenterology)   Chief Complaint:  Medical Management of Chronic Issues (4 mo (moore) ), Hypertension, Diabetes, and Hyperlipidemia   HPI: Bradley Goodman is a 63 y.o. male presenting on 07/08/2019 for Medical Management of Chronic Issues (4 mo (moore) ), Hypertension, Diabetes, and Hyperlipidemia   1. Hypertension associated with diabetes ([place])  Complaint with meds - Yes Current Medications - losartan Checking BP at home ranging 120/70 Exercising Regularly - Yes Watching Salt intake - Yes Pertinent ROS:  Headache - No Fatigue - No Visual Disturbances - No Chest pain - No Dyspnea - No Palpitations - No LE edema - No They report good compliance with medications and can restate their regimen by memory. No medication side effects.  Family, social, and smoking history reviewed.   BP Readings from Last 3 Encounters:  07/08/19 115/77  11/04/18 137/82  10/21/18 (!) 150/100   CMP Latest Ref Rng & Units 11/04/2018 09/25/2018 09/18/2018  Glucose 65 - 99 mg/dL 97 108(H) 94  BUN 8 - 27 mg/dL _0 Creatinine 0.76 - 1.27 mg/dL 0.89 0.98 0.95  Sodium 134 - 144 mmol/L 142 141 146(H)  Potassium 3.5 - 5.2 mmol/L 4.5 4.8 5.5(H)  Chloride 96 - 106 mmol/L 102 102 105  CO2 20 - 29 mmol/L _1 Calcium 8.6 - 10.2 mg/dL 9.4 9.9 9.9  Total Protein 6.0 - 8.5 g/dL - - 6.6  Total Bilirubin 0.0 - 1.2 mg/dL - - <0.2  Alkaline Phos 39 - 117 IU/L - - 75  AST 0 - 40 IU/L - - 15  ALT 0 - 44 IU/L - - 15      2. Gastroesophageal reflux disease Compliant with medications - Yes Current medications - pantoprazole Adverse side effects - No Cough - No Sore throat - No Voice change - No Hemoptysis - No Dysphagia or dyspepsia - No Water brash - No Red Flags (weight loss,  hematochezia, melena, weight loss, early satiety, fevers, odynophagia, or persistent vomiting) - No   3. Vitamin D deficiency  Pt is taking oral repletion therapy. Denies bone pain and tenderness, muscle weakness, fracture, and difficulty walking. Lab Results  Component Value Date   VD25OH 69.5 09/18/2018   VD25OH 74.5 05/01/2018   VD25OH 63.5 12/11/2017   Lab Results  Component Value Date   CALCIUM 9.4 11/04/2018      4. Hyperlipidemia associated with type 2 diabetes mellitus ([place])  Compliant with medications - Yes Current medications - Crestor Side effects from medications - No Diet - generally healthy Exercise - active daily, works full time as a Teacher, music  Component Value Date   CHOL 95 (L) 09/18/2018   HDL 40 09/18/2018   LDLCALC 43 09/18/2018   TRIG 62 09/18/2018   CHOLHDL 2.4 09/18/2018     Family and personal medical history reviewed. Smoking and ETOH history reviewed.    5. Type 2 diabetes mellitus without complication, without long-term current use of insulin ([place])  Diabetes has been well controlled. Does not check blood sugars at home. Is very mindful of what he eats. Does try to stay active. No polyuria, polydipsia, or polyphagia.    6. Depression, recurrent ([place])  Pt feels his symptoms are well controlled on  his current medications. States he does have a few down moments, but this is not often. No SI or HI. Complaint with medications without associated side effects.      Relevant past medical, surgical, family, and social history reviewed and updated as indicated.  Allergies and medications reviewed and updated. Date reviewed: Chart in Epic.   Past Medical History:  Diagnosis Date  . Cervical spondylolysis   . Essential hypertension   . GERD (gastroesophageal reflux disease)   . History of migraine   . Hyperlipidemia   . Type 2 diabetes mellitus ([place])     Past Surgical History:  Procedure Laterality Date  . Bilateral inguinal hernia  repair    . COLONOSCOPY  01/23/2012   Procedure: COLONOSCOPY;  Surgeon: Daneil Dolin, MD;  Location[facility];  Service: Endoscopy;  Laterality: N/A;  9:30 AM  . COLONOSCOPY N/A 10/11/2015   Procedure: COLONOSCOPY;  Surgeon: Daneil Dolin, MD;  Location: [facility];  Service: Endoscopy;  Laterality: N/A;  830    Social History   Socioeconomic History  . Marital status: Married    Spouse name: Not on file  . Number of children: Not on file  . Years of education: Not on file  . Highest education level: Not on file  Occupational History  . Not on file  Social Needs  . Financial resource strain: Not on file  . Food insecurity    Worry: Not on file    Inability: Not on file  . Transportation needs    Medical: Not on file    Non-medical: Not on file  Tobacco Use  . Smoking status: Current Every Day Smoker    Packs/day: 1.00    Years: 30.00    Pack years: 30.00    Types: Cigarettes    Start date: 10/30/1973  . Smokeless tobacco: Never Used  Substance and Sexual Activity  . Alcohol use: Yes    Alcohol/week: 0.0 standard drinks    Comment: One drink every 6 months.  . Drug use: No  . Sexual activity: Not on file  Lifestyle  . Physical activity    Days per week: Not on file    Minutes per session: Not on file  . Stress: Not on file  Relationships  . Social Herbalist on phone: Not on file    Gets together: Not on file    Attends religious service: Not on file    Active member of club or organization: Not on file    Attends meetings of clubs or organizations: Not on file    Relationship status: Not on file  . Intimate partner violence    Fear of current or ex partner: Not on file    Emotionally abused: Not on file    Physically abused: Not on file    Forced sexual activity: Not on file  Other Topics Concern  . Not on file  Social History Narrative  . Not on file    Outpatient Encounter Medications as of 07/08/2019  Medication Sig  . aspirin EC 81  MG tablet Take 81 mg by mouth every evening.  . cholecalciferol (VITAMIN D) 1000 UNITS tablet Take 2,000 Units by mouth every evening. Pt takes two tablets daily  . Krill Oil 300 MG CAPS Take 1 capsule by mouth every evening.  . loratadine (CLARITIN) 10 MG tablet Take 10 mg by mouth every evening.   Marland Kitchen losartan (COZAAR) 50 MG tablet Take 1.5 tablets (75  mg total) by mouth daily.  . pantoprazole (PROTONIX) 40 MG tablet Take 1 tablet (40 mg total) by mouth daily.  . rosuvastatin (CRESTOR) 20 MG tablet Take 1 tablet (20 mg total) by mouth daily.  Marland Kitchen venlafaxine XR (EFFEXOR-XR) 37.5 MG 24 hr capsule TAKE 1 CAPSULE BY MOUTH DAILY WITH BREAKFAST  . [DISCONTINUED] losartan (COZAAR) 50 MG tablet Take 1.5 tablets (75 mg total) by mouth daily.  . [DISCONTINUED] pantoprazole (PROTONIX) 40 MG tablet TAKE 1 TABLET (40 MG TOTAL) BY MOUTH DAILY.  . [DISCONTINUED] rosuvastatin (CRESTOR) 20 MG tablet TAKE 1 TABLET (20 MG TOTAL) BY MOUTH DAILY.  . [DISCONTINUED] venlafaxine XR (EFFEXOR-XR) 37.5 MG 24 hr capsule TAKE 1 CAPSULE BY MOUTH DAILY WITH BREAKFAST   No facility-administered encounter medications on file as of 07/08/2019.     No Known Allergies  Review of Systems  Constitutional: Negative for activity change, appetite change, chills, diaphoresis, fatigue, fever and unexpected weight change.  HENT: Negative.   Eyes: Negative.  Negative for photophobia and visual disturbance.  Respiratory: Negative for cough, chest tightness and shortness of breath.   Cardiovascular: Negative for chest pain, palpitations and leg swelling.  Gastrointestinal: Negative for abdominal pain, blood in stool, constipation, diarrhea, nausea and vomiting.  Endocrine: Negative.  Negative for polydipsia, polyphagia and polyuria.  Genitourinary: Negative for decreased urine volume, difficulty urinating, dysuria, frequency and urgency.  Musculoskeletal: Negative for arthralgias and myalgias.  Skin: Negative.  Negative for color change  and pallor.  Allergic/Immunologic: Negative.   Neurological: Negative for dizziness, tremors, seizures, syncope, facial asymmetry, speech difficulty, weakness, light-headedness, numbness and headaches.  Hematological: Negative.  Does not bruise/bleed easily.  Psychiatric/Behavioral: Positive for dysphoric mood. Negative for agitation, behavioral problems, confusion, decreased concentration, hallucinations, self-injury, sleep disturbance and suicidal ideas. The patient is not nervous/anxious and is not hyperactive.   All other systems reviewed and are negative.       Objective:  BP 115/77 (BP Location: Right Arm, Cuff Size: Normal)   Pulse 95   Temp 98 F (36.7 C)   Resp 18   Ht 6' (1.829 m)   Wt 158 lb (71.7 kg)   SpO2 100%   BMI 21.43 kg/m    Wt Readings from Last 3 Encounters:  07/08/19 158 lb (71.7 kg)  10/21/18 156 lb (70.8 kg)  09/18/18 157 lb (71.2 kg)    Physical Exam Vitals signs and nursing note reviewed.  Constitutional:      General: He is not in acute distress.    Appearance: Normal appearance. He is well-developed and well-groomed. He is not ill-appearing, toxic-appearing or diaphoretic.  HENT:     Head: Normocephalic and atraumatic.     Jaw: There is normal jaw occlusion.     Right Ear: Hearing normal.     Left Ear: Hearing normal.     Nose: Nose normal.     Mouth/Throat:     Lips: Pink.     Mouth: Mucous membranes are moist.     Pharynx: Oropharynx is clear. Uvula midline.  Eyes:     General: Lids are normal.     Extraocular Movements: Extraocular movements intact.     Conjunctiva/sclera: Conjunctivae normal.     Pupils: Pupils are equal, round, and reactive to light.  Neck:     Musculoskeletal: Normal range of motion and neck supple.     Thyroid: No thyroid mass, thyromegaly or thyroid tenderness.     Vascular: No carotid bruit or JVD.     Trachea: Trachea  and phonation normal.  Cardiovascular:     Rate and Rhythm: Normal rate and regular rhythm.      Chest Wall: PMI is not displaced.     Pulses: Normal pulses.     Heart sounds: Normal heart sounds. No murmur. No friction rub. No gallop.   Pulmonary:     Effort: Pulmonary effort is normal. No respiratory distress.     Breath sounds: Normal breath sounds. No wheezing.  Abdominal:     General: Bowel sounds are normal. There is no distension or abdominal bruit.     Palpations: Abdomen is soft. There is no hepatomegaly or splenomegaly.     Tenderness: There is no abdominal tenderness. There is no right CVA tenderness or left CVA tenderness.     Hernia: No hernia is present.  Musculoskeletal: Normal range of motion.     Right lower leg: No edema.     Left lower leg: No edema.  Lymphadenopathy:     Cervical: No cervical adenopathy.  Skin:    General: Skin is warm and dry.     Capillary Refill: Capillary refill takes less than 2 seconds.     Coloration: Skin is not cyanotic, jaundiced or pale.     Findings: No rash.  Neurological:     General: No focal deficit present.     Mental Status: He is alert and oriented to person, place, and time.     Cranial Nerves: Cranial nerves are intact.     Sensory: Sensation is intact.     Motor: Motor function is intact.     Coordination: Coordination is intact.     Gait: Gait is intact.     Deep Tendon Reflexes: Reflexes are normal and symmetric.  Psychiatric:        Attention and Perception: Attention and perception normal.        Mood and Affect: Mood and affect normal.        Speech: Speech normal.        Behavior: Behavior normal. Behavior is cooperative.        Thought Content: Thought content normal.        Cognition and Memory: Cognition and memory normal.        Judgment: Judgment normal.     Results for orders placed or performed in visit on 07/08/19  Bayer DCA Hb A1c Waived  Result Value Ref Range   HB A1C (BAYER DCA - WAIVED) 5.7 <7.0 %       Pertinent labs & imaging results that were available during my care of the  patient were reviewed by me and considered in my medical decision making.  Assessment & Plan:  Bradley Goodman was seen today for medical management of chronic issues, hypertension, diabetes and hyperlipidemia.  Diagnoses and all orders for this visit:  Hypertension associated with diabetes ([place]) BP well controlled. Changes were not made in regimen today. Daily blood pressure log given with instructions on how to fill out and told to bring to next visit. Goal BP 130/80. Pt aware to report any persistent high or low readings. DASH diet and exercise encouraged. Exercise at least 150 minutes per week and increase as tolerated. Goal BMI > 25. Stress management encouraged. Smoking cessation discussed. Avoid excessive alcohol. Avoid NSAID's. Avoid more than 2000 mg of sodium daily. Medications as prescribed. Follow up as scheduled.  -     CBC with Differential/Platelet -     CMP14+EGFR -     losartan (COZAAR) 50 MG tablet;  Take 1.5 tablets (75 mg total) by mouth daily.  Gastroesophageal reflux disease, esophagitis presence not specified No red flags present. Diet discussed. Avoid fried, spicy, fatty, greasy, and acidic foods. Avoid caffeine, nicotine, and alcohol. Do not eat 2-3 hours before bedtime and stay upright for at least 1-2 hours after eating. Eat small frequent meals. Avoid NSAID's like motrin and aleve. Medications as prescribed. Report any new or worsening symptoms. Follow up as discussed or sooner if needed.   -     CBC with Differential/Platelet -     pantoprazole (PROTONIX) 40 MG tablet; Take 1 tablet (40 mg total) by mouth daily.  Vitamin D deficiency Labs pending. Continue repletion therapy. If indicated, will change repletion dosage. Eat foods rich in Vit D including milk, orange juice, yogurt with vitamin D added, salmon or mackerel, canned tuna fish, cereals with vitamin D added, and cod liver oil. Get out in the sun but make sure to wear at least SPF 30 sunscreen.  -     CBC with  Differential/Platelet  Hyperlipidemia associated with type 2 diabetes mellitus ([place]) Diet encouraged - increase intake of fresh fruits and vegetables, increase intake of lean proteins. Bake, broil, or grill foods. Avoid fried, greasy, and fatty foods. Avoid fast foods. Increase intake of fiber-rich whole grains. Exercise encouraged - at least 150 minutes per week and advance as tolerated.  Goal BMI < 25. Continue medications as prescribed. Follow up in 3-6 months as discussed.  -     CBC with Differential/Platelet -     Lipid panel -     rosuvastatin (CRESTOR) 20 MG tablet; Take 1 tablet (20 mg total) by mouth daily.  Type 2 diabetes mellitus without complication, without long-term current use of insulin ([facility]) Lab Results  Component Value Date   HGBA1C 5.7 07/08/2019   HGBA1C 5.7 09/18/2018   HGBA1C 5.9 05/01/2018    Diabetes Control: well with diet and exercise alone, will continue.  Instruction/counseling given: reminded to get eye exam, discussed foot care and discussed the need for weight loss -     CBC with Differential/Platelet -     Bayer DCA Hb A1c Waived  Depression, recurrent ([place]) Well controlled, continue below.  -     venlafaxine XR (EFFEXOR-XR) 37.5 MG 24 hr capsule; TAKE 1 CAPSULE BY MOUTH DAILY WITH BREAKFAST     Continue all other maintenance medications.  Follow up plan: Return in about 4 months (around 11/07/2019), or if symptoms worsen or fail to improve.  Continue healthy lifestyle choices, including diet (rich in fruits, vegetables, and lean proteins, and low in salt and simple carbohydrates) and exercise (at least 30 minutes of moderate physical activity daily).  Educational handout given for survey, COVID-19  The above assessment and management plan was discussed with the patient. The patient verbalized understanding of and has agreed to the management plan. Patient is aware to call the clinic if they develop any new symptoms or if symptoms persist or  worsen. Patient is aware when to return to the clinic for a follow-up visit. Patient educated on when it is appropriate to go to the emergency department.   Monia Pouch, FNP-C [facility] [phone]

## 2019-07-13 ENCOUNTER — Telehealth: Payer: Self-pay

## 2019-07-13 NOTE — Telephone Encounter (Signed)
Pt received a letter several months ago about scheduling his TCS. Pt's spouse called to schedule apt. Per note, pt needs an ov prior. Scheduled for 08/04/2019 @ 3:30 with EG. [phone]

## 2019-07-13 NOTE — Telephone Encounter (Signed)
Routing to [facility] d/t pt received letter to schedule nurse visit.

## 2019-07-14 NOTE — Telephone Encounter (Signed)
Pt called back and is not having any GI symptoms so he is aware that we will do a nurse visit by phone.

## 2019-07-14 NOTE — Telephone Encounter (Signed)
Left message on voice mail for pt to call me back.  Pt may only need a nurse visit unless he is experiencing problems.

## 2019-07-31 MED FILL — LOSARTAN POTASSIUM 50 MG TA: 50 | 30 days supply | Qty: 45 | Fill #5

## 2019-08-04 ENCOUNTER — Other Ambulatory Visit: Payer: Self-pay

## 2019-08-04 ENCOUNTER — Ambulatory Visit (INDEPENDENT_AMBULATORY_CARE_PROVIDER_SITE_OTHER): Payer: 59 | Admitting: *Deleted

## 2019-08-04 ENCOUNTER — Ambulatory Visit: Payer: 59 | Admitting: Nurse Practitioner

## 2019-08-04 DIAGNOSIS — Z86010 Personal history of colonic polyps: Secondary | ICD-10-CM

## 2019-08-04 MED ORDER — CLENPIQ 10-3.5-12 MG-GM -GM/160ML PO SOLN: 1.0000 | Freq: Once | ORAL | 0 refills | Status: AC

## 2019-08-04 MED FILL — CLENPIQ 10-3.5-12 MG-GM -GM: 10-3.5-12 M | 1 days supply | Qty: 320 | Fill #0

## 2019-08-04 NOTE — Progress Notes (Addendum)
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 Diabetes Melitis: no 2. Joint replacements in the past 12 months: no 3. Major health problems in the past 3 months: no 4. Has an artificial valve or MVP: no 5. Has a defibrillator: no 6. Has been advised in past to take antibiotics in advance of a procedure like teeth cleaning: no 7. Family history of colon cancer: no  8. Alcohol Use: yes, 1 beer every 3 weeks 9. Illicit drug Use: no 10. History of sleep apnea: no  11. History of coronary artery or other vascular stents placed within the last 12 months: no 12. History of any prior anesthesia complications: yes, n/v afterwards 13. There is no height or weight on file to calculate BMI.ht: 6'0 wt: 157 lbs    MEDICATIONS & ALLERGIES:    Patient reports the following regarding taking any blood thinners:   Plavix? no Aspirin? yes Coumadin? no Brilinta? no Xarelto? no Eliquis? no Pradaxa? no Savaysa? no Effient? no  Patient confirms/reports the following medications:  Current Outpatient Medications  Medication Sig Dispense Refill  . aspirin EC 81 MG tablet Take 81 mg by mouth every evening.    . cholecalciferol (VITAMIN D) 1000 UNITS tablet Take 2,000 Units by mouth every evening. Pt takes two tablets daily    . Krill Oil 300 MG CAPS Take 1 capsule by mouth every evening.    . loratadine (CLARITIN) 10 MG tablet Take 10 mg by mouth every evening.     Marland Kitchen losartan (COZAAR) 50 MG tablet Take 1.5 tablets (75 mg total) by mouth daily. 135 tablet 1  . pantoprazole (PROTONIX) 40 MG tablet Take 1 tablet (40 mg total) by mouth daily. 90 tablet 3  . rosuvastatin (CRESTOR) 20 MG tablet Take 1 tablet (20 mg total) by mouth daily. 90 tablet 3  . venlafaxine XR (EFFEXOR-XR) 37.5 MG 24 hr  capsule TAKE 1 CAPSULE BY MOUTH DAILY WITH BREAKFAST 90 capsule 3   No current facility-administered medications for this visit.     Patient confirms/reports the following allergies:  No Known Allergies  No orders of the defined types were placed in this encounter.   AUTHORIZATION INFORMATION Primary Insurance: [place],  [place] #: [id number],  Group #: [id number] Pre-Cert / Josem Kaufmann required: No, not required per Levonne Spiller / Auth #: Ref# [id number]  SCHEDULE INFORMATION: Procedure has been scheduled as follows:  Date: 10/14/2019, Time: 1:45 Location: [facility] with Dr. Gala Romney  This Gastroenterology Pre-Precedure Review Form is being routed to the following provider(s): Roseanne Kaufman, NP

## 2019-08-04 NOTE — Patient Instructions (Signed)
[facility] INSTRUCTIONS   Patient Name:  SPENCER CARDINAL Date of procedure: 10/14/2019 Time to register at [facility] Stay:  12:45 pm Provider:  Dr. Gala Romney   Please notify us immediately if you are diabetic, take iron supplements, or if you are on coumadin or any blood thinners.   Note: Do NOT refrigerate or freeze CLENPIQ. CLENPIQ is ready to drink. There is no need to add any other liquid or mix the medicine in the bottle before you start dosing.   10/13/2019  1 Day prior to procedure:  Tuesday   CLEAR LIQUIDS ALL DAY--NO SOLID FOODS OR DAIRY PRODUCTS! See list of liquids that are allowed and items that are NOT allowed below.    You must drink plenty of CLEAR LIQUIDS starting before your bowel prep. It is important to stay adequately hydrated before, during, and after your bowel prep for the prep to work effectively!    At 5:00 PM Begin the prep as follows:    1. Drink one bottle of premixed CLENPIQ right from the bottle. 2. Drink at least five (5) 8-ounce drinks of clear liquids of your choice within the next 5 hours   Continue clear liquids.    10/14/2019    Day of Procedure:  Wednesday    You may take TYLENOL products. Please continue your regular medications unless we have instructed you otherwise.    5 hours before procedure @  8:45 am:  1. Drink second bottle of premixed CLENPIQ right from the bottle.   2. Drink at least three (3) 8-ounce drinks of clear liquids of your choice within the next 2 hours. You can drink more if needed.   3 hours before your procedure time @ 10:45 am: Stop drinking all liquids, nothing by mouth at this point.  Please note, on the day of your procedure you MUST be accompanied by an adult who is willing to assume responsibility for you at time of discharge. If you do not have such person with you, your procedure will have to be rescheduled.                                                                                                                      Please leave ALL jewelry at home prior to coming to the hospital for your procedure.   *It is your responsibility to check with your insurance company for the benefits of coverage you have for this procedure. Unfortunately, not all insurance companies have benefits to cover all or part of these types of procedures. It is your responsibility to check your benefits, however we will be glad to assist you with any codes your insurance company may need.   Please note that most insurance companies will not cover a screening colonoscopy for people under the age of 14  For example, with some insurance companies you may have benefits for a screening colonoscopy, but if polyps are found the diagnosis will change and then you may have a  deductible that will need to be met. Please make sure you check your benefits for screening colonoscopy as well as a diagnostic colonoscopy.   CLEAR LIQUIDS: (NO RED or PURPLE) Water  Jello   Apple Juice  White Grape Juice   Kool-Aid Soft drinks  Banana popsicles Sports Drink  Black coffee (No cream or milk) Tea (No cream or milk)  Broth (fat free beef/chicken/vegetable)  Clear liquids allow you to see your fingers on the other side of the glass.  Be sure they are NOT RED or PURPLE in color, cloudy, but CLEAR.  Do Not Eat: Dairy products of any kind Cranberry juice Tomato or V8 Juice  Orange Juice   Grapefruit Juice Red Grape Juice Alcohol   Non-dairy creamer Solid foods like cereal, oatmeal, yogurt, fruits, vegetables, creamed soups, eggs, bread, etc   HELPFUL HINTS TO MAKE DRINKING EASIER: -Trying drinking through a straw. -If you become nauseated, try consuming smaller amounts or stretch out the time between glasses.  Stop for 30 minutes & slowly start back drinking.  Call our office with any questions or concerns at [phone].  Thank You,  _0 @

## 2019-08-06 NOTE — Addendum Note (Signed)
Addended by: Metro Kung on: 08/06/2019 04:42 PM   Modules accepted: Orders, SmartSet

## 2019-08-06 NOTE — Progress Notes (Signed)
Appropriate.

## 2019-09-04 MED FILL — LOSARTAN POTASSIUM 50 MG TA: 50 | 90 days supply | Qty: 135 | Fill #0

## 2019-09-07 ENCOUNTER — Other Ambulatory Visit: Payer: Self-pay | Admitting: *Deleted

## 2019-09-07 DIAGNOSIS — E1159 Type 2 diabetes mellitus with other circulatory complications: Secondary | ICD-10-CM

## 2019-09-07 DIAGNOSIS — I152 Hypertension secondary to endocrine disorders: Secondary | ICD-10-CM

## 2019-09-17 MED FILL — VENLAFAXINE HCL ER 37.5 MG: 37.5 | 90 days supply | Qty: 90 | Fill #0

## 2019-09-17 MED FILL — PANTOPRAZOLE SOD DR 40 MG T: 40 | 90 days supply | Qty: 90 | Fill #0

## 2019-09-28 ENCOUNTER — Other Ambulatory Visit: Payer: Self-pay | Admitting: *Deleted

## 2019-09-28 DIAGNOSIS — H52203 Unspecified astigmatism, bilateral: Secondary | ICD-10-CM | POA: Diagnosis not present

## 2019-09-28 DIAGNOSIS — F339 Major depressive disorder, recurrent, unspecified: Secondary | ICD-10-CM

## 2019-09-28 DIAGNOSIS — H02831 Dermatochalasis of right upper eyelid: Secondary | ICD-10-CM | POA: Diagnosis not present

## 2019-09-28 DIAGNOSIS — H02834 Dermatochalasis of left upper eyelid: Secondary | ICD-10-CM | POA: Diagnosis not present

## 2019-09-28 DIAGNOSIS — H2513 Age-related nuclear cataract, bilateral: Secondary | ICD-10-CM | POA: Diagnosis not present

## 2019-09-28 DIAGNOSIS — H5203 Hypermetropia, bilateral: Secondary | ICD-10-CM | POA: Diagnosis not present

## 2019-09-28 DIAGNOSIS — H524 Presbyopia: Secondary | ICD-10-CM | POA: Diagnosis not present

## 2019-09-28 LAB — HM DIABETES EYE EXAM

## 2019-09-30 MED FILL — ROSUVASTATIN CALCIUM 20 MG: 20 | 90 days supply | Qty: 90 | Fill #1

## 2019-10-12 ENCOUNTER — Other Ambulatory Visit (HOSPITAL_COMMUNITY): Payer: 59

## 2019-10-12 ENCOUNTER — Other Ambulatory Visit (HOSPITAL_COMMUNITY)
Admission: RE | Admit: 2019-10-12 | Discharge: 2019-10-12 | Disposition: A | Discharge status: Home / Self Care | Payer: 59 | Source: Ambulatory Visit | Attending: Internal Medicine | Admitting: Internal Medicine

## 2019-10-12 ENCOUNTER — Other Ambulatory Visit: Payer: Self-pay

## 2019-10-12 DIAGNOSIS — Z01812 Encounter for preprocedural laboratory examination: Secondary | ICD-10-CM | POA: Insufficient documentation

## 2019-10-12 DIAGNOSIS — Z20822 Contact with and (suspected) exposure to covid-19: Secondary | ICD-10-CM | POA: Insufficient documentation

## 2019-10-12 LAB — SARS CORONAVIRUS 2 (TAT 6-24 HRS): SARS Coronavirus 2: NEGATIVE

## 2019-10-14 ENCOUNTER — Other Ambulatory Visit: Payer: Self-pay

## 2019-10-14 ENCOUNTER — Encounter (HOSPITAL_COMMUNITY)
Admission: RE | Disposition: A | Discharge status: Home / Self Care | Payer: Self-pay | Source: Home / Self Care | Attending: Internal Medicine

## 2019-10-14 ENCOUNTER — Ambulatory Visit (HOSPITAL_COMMUNITY)
Admission: RE | Admit: 2019-10-14 | Discharge: 2019-10-14 | Disposition: A | Discharge status: Home / Self Care | Payer: 59 | Attending: Internal Medicine | Admitting: Internal Medicine

## 2019-10-14 ENCOUNTER — Encounter (HOSPITAL_COMMUNITY): Payer: Self-pay | Admitting: Internal Medicine

## 2019-10-14 DIAGNOSIS — Z86010 Personal history of colonic polyps: Secondary | ICD-10-CM | POA: Insufficient documentation

## 2019-10-14 DIAGNOSIS — D122 Benign neoplasm of ascending colon: Secondary | ICD-10-CM | POA: Diagnosis not present

## 2019-10-14 DIAGNOSIS — D124 Benign neoplasm of descending colon: Secondary | ICD-10-CM | POA: Diagnosis not present

## 2019-10-14 DIAGNOSIS — K219 Gastro-esophageal reflux disease without esophagitis: Secondary | ICD-10-CM | POA: Insufficient documentation

## 2019-10-14 DIAGNOSIS — F17210 Nicotine dependence, cigarettes, uncomplicated: Secondary | ICD-10-CM | POA: Diagnosis not present

## 2019-10-14 DIAGNOSIS — E785 Hyperlipidemia, unspecified: Secondary | ICD-10-CM | POA: Diagnosis not present

## 2019-10-14 DIAGNOSIS — E119 Type 2 diabetes mellitus without complications: Secondary | ICD-10-CM | POA: Insufficient documentation

## 2019-10-14 DIAGNOSIS — K635 Polyp of colon: Secondary | ICD-10-CM

## 2019-10-14 DIAGNOSIS — I10 Essential (primary) hypertension: Secondary | ICD-10-CM | POA: Insufficient documentation

## 2019-10-14 DIAGNOSIS — Z7982 Long term (current) use of aspirin: Secondary | ICD-10-CM | POA: Diagnosis not present

## 2019-10-14 DIAGNOSIS — Z79899 Other long term (current) drug therapy: Secondary | ICD-10-CM | POA: Diagnosis not present

## 2019-10-14 DIAGNOSIS — Z09 Encounter for follow-up examination after completed treatment for conditions other than malignant neoplasm: Secondary | ICD-10-CM | POA: Insufficient documentation

## 2019-10-14 HISTORY — PX: COLONOSCOPY: SHX5424

## 2019-10-14 HISTORY — DX: Personal history of urinary calculi: Z87.442

## 2019-10-14 HISTORY — PX: POLYPECTOMY: SHX5525

## 2019-10-14 SURGERY — COLONOSCOPY
Anesthesia: Moderate Sedation

## 2019-10-14 MED ORDER — MEPERIDINE HCL 100 MG/ML IJ SOLN
INTRAMUSCULAR | Status: DC | PRN
  Administered 2019-10-14: 25 mg via INTRAVENOUS

## 2019-10-14 MED ORDER — MIDAZOLAM HCL 5 MG/5ML IJ SOLN
INTRAMUSCULAR | Status: AC
  Filled 2019-10-14: qty 10

## 2019-10-14 MED ORDER — MIDAZOLAM HCL 5 MG/5ML IJ SOLN
INTRAMUSCULAR | Status: DC | PRN
  Administered 2019-10-14 (×2): 1 mg via INTRAVENOUS
  Administered 2019-10-14 (×2): 2 mg via INTRAVENOUS

## 2019-10-14 MED ORDER — ONDANSETRON HCL 4 MG/2ML IJ SOLN
INTRAMUSCULAR | Status: DC | PRN
  Administered 2019-10-14: 4 mg via INTRAVENOUS

## 2019-10-14 MED ORDER — ONDANSETRON HCL 4 MG/2ML IJ SOLN
INTRAMUSCULAR | Status: AC
  Filled 2019-10-14: qty 2

## 2019-10-14 MED ORDER — MEPERIDINE HCL 50 MG/ML IJ SOLN
INTRAMUSCULAR | Status: AC
  Filled 2019-10-14: qty 1

## 2019-10-14 MED ORDER — SODIUM CHLORIDE 0.9 % IV SOLN
INTRAVENOUS | Status: DC
  Administered 2019-10-14: 13:00:00 1000 mL via INTRAVENOUS

## 2019-10-14 MED ORDER — STERILE WATER FOR IRRIGATION IR SOLN
Status: DC | PRN
  Administered 2019-10-14: 1.5 mL

## 2019-10-14 NOTE — Op Note (Signed)
[facility] Patient Name: Bradley Goodman Procedure Date: 10/14/2019 1:16 PM MRN: [id number] Date of Birth: 01-Feb-1956 Attending MD: Norvel Richards , MD CSN: [id number] Age: 64 Admit Type: Outpatient Procedure:                Colonoscopy Indications:              High risk colon cancer surveillance: Personal                            history of colonic polyps Providers:                Norvel Richards, MD, Charlsie Quest. Theda Sers RN, RN,                            Nelma Rothman, Technician Referring MD:              Medicines:                Midazolam 6 mg IV, Meperidine 25 mg IV Complications:            No immediate complications. Estimated Blood Loss:     Estimated blood loss was minimal. Procedure:                Pre-Anesthesia Assessment:                           - Prior to the procedure, a History and Physical                            was performed, and patient medications and                            allergies were reviewed. The patient's tolerance of                            previous anesthesia was also reviewed. The risks                            and benefits of the procedure and the sedation                            options and risks were discussed with the patient.                            All questions were answered, and informed consent                            was obtained. Prior Anticoagulants: The patient has                            taken no previous anticoagulant or antiplatelet                            agents. ASA Grade Assessment: II - A patient with  mild systemic disease. After reviewing the risks                            and benefits, the patient was deemed in                            satisfactory condition to undergo the procedure.                           After obtaining informed consent, the colonoscope                            was passed under direct vision. Throughout the   procedure, the patient's blood pressure, pulse, and                            oxygen saturations were monitored continuously. The                            CF-HQ190L ([id number]) scope was introduced through                            the anus and advanced to the the cecum, identified                            by appendiceal orifice and ileocecal valve. The                            colonoscopy was performed without difficulty. The                            patient tolerated the procedure well. The quality                            of the bowel preparation was adequate. Scope In: 1:32:21 PM Scope Out: 1:46:33 PM Scope Withdrawal Time: 0 hours 9 minutes 46 seconds  Total Procedure Duration: 0 hours 14 minutes 12 seconds  Findings:      The perianal and digital rectal examinations were normal.      Two sessile polyps were found in the descending colon and ascending       colon. The polyps were 4 to 6 mm in size. These polyps were removed with       a cold snare. Resection and retrieval were complete. Estimated blood       loss was minimal.      The exam was otherwise without abnormality on direct and retroflexion       views. Impression:               - Two 4 to 6 mm polyps in the descending colon and                            in the ascending colon, removed with a cold snare.  Resected and retrieved.                           - The examination was otherwise normal on direct                            and retroflexion views. Moderate Sedation:      Moderate (conscious) sedation was administered by the endoscopy nurse       and supervised by the endoscopist. The following parameters were       monitored: oxygen saturation, heart rate, blood pressure, respiratory       rate, EKG, adequacy of pulmonary ventilation, and response to care.       Total physician intraservice time was 21 minutes. Recommendation:           - Patient has a contact number available for                             emergencies. The signs and symptoms of potential                            delayed complications were discussed with the                            patient. Return to normal activities tomorrow.                            Written discharge instructions were provided to the                            patient.                           - Resume previous diet.                           - Continue present medications.                           - Repeat colonoscopy date to be determined after                            pending pathology results are reviewed for                            surveillance.                           - Return to GI office (date not yet determined). Procedure Code(s):        --- Professional ---                           [phone], Colonoscopy, flexible; with removal of                            tumor(s), polyp(s), or other lesion(s) by snare  technique                           G0500, Moderate sedation services provided by the                            same physician or other qualified health care                            professional performing a gastrointestinal                            endoscopic service that sedation supports,                            requiring the presence of an independent trained                            observer to assist in the monitoring of the                            patient's level of consciousness and physiological                            status; initial 15 minutes of intra-service time;                            patient age 24 years or older (additional time may                            be reported with [phone], as appropriate) Diagnosis Code(s):        --- Professional ---                           Z86.010, Personal history of colonic polyps                           K63.5, Polyp of colon CPT copyright 2019 American [employer] Association. All rights reserved. The codes  documented in this report are preliminary and upon coder review may  be revised to meet current compliance requirements. Cristopher Estimable. Tildon Silveria, MD Norvel Richards, MD 10/14/2019 1:54:49 PM This report has been signed electronically. Number of Addenda: 0

## 2019-10-14 NOTE — H&P (Signed)
@LOGO @   Primary Care Physician:  [name], FNP Primary Gastroenterologist:  Dr. Gala Romney  Pre-Procedure History & Physical: HPI:  Bradley Goodman is a 64 y.o. male here for surveillance colonoscopy.  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  No GI symptoms currently.  Past Medical History:  Diagnosis Date  . Cervical spondylolysis   . Essential hypertension   . GERD (gastroesophageal reflux disease)   . History of kidney stones   . History of migraine   . Hyperlipidemia   . PONV (postoperative nausea and vomiting)   . Type 2 diabetes mellitus ([place])     Past Surgical History:  Procedure Laterality Date  . Bilateral inguinal hernia repair    . COLONOSCOPY  01/23/2012   Procedure: COLONOSCOPY;  Surgeon: Daneil Dolin, MD;  Location: [facility];  Service: Endoscopy;  Laterality: N/A;  9:30 AM  . COLONOSCOPY N/A 10/11/2015   Procedure: COLONOSCOPY;  Surgeon: Daneil Dolin, MD;  Location: [facility];  Service: Endoscopy;  Laterality: N/A;  830    Prior to Admission medications   Medication Sig Start Date End Date Taking? Authorizing Provider  aspirin EC 81 MG tablet Take 81 mg by mouth every evening.   Yes [provider]  cholecalciferol (VITAMIN D) 1000 UNITS tablet Take 2,000 Units by mouth every evening. Pt takes two tablets daily   Yes [provider]  Krill Oil 300 MG CAPS Take 1 capsule by mouth every evening.   Yes [provider]  loratadine (CLARITIN) 10 MG tablet Take 10 mg by mouth every evening.    Yes [provider]  losartan (COZAAR) 50 MG tablet Take 1.5 tablets (75 mg total) by mouth daily. 07/08/19  Yes [name], Connye Burkitt, FNP  pantoprazole (PROTONIX) 40 MG tablet Take 1 tablet (40 mg total) by mouth daily. Patient taking differently: Take 40 mg by mouth every evening.  07/08/19  Yes [name], Connye Burkitt, FNP  rosuvastatin (CRESTOR) 20 MG tablet Take 1 tablet (20 mg  total) by mouth daily. Patient taking differently: Take 20 mg by mouth every evening.  07/08/19  Yes [name], Connye Burkitt, FNP  venlafaxine XR (EFFEXOR-XR) 37.5 MG 24 hr capsule TAKE 1 CAPSULE BY MOUTH DAILY WITH BREAKFAST Patient taking differently: Take 37.5 mg by mouth every evening.  07/08/19  Yes RakesConnye Burkitt, FNP    Allergies as of 08/06/2019  . (No Known Allergies)    Family History  Problem Relation Age of Onset  . Heart attack Mother   . Heart attack Father   . Heart attack Brother   . Colon cancer Neg Hx     Social History   Socioeconomic History  . Marital status: Married    Spouse name: Not on file  . Number of children: Not on file  . Years of education: Not on file  . Highest education level: Not on file  Occupational History  . Not on file  Tobacco Use  . Smoking status: Current Every Day Smoker    Packs/day: 1.00    Years: 30.00    Pack years: 30.00    Types: Cigarettes    Start date: 10/30/1973  . Smokeless tobacco: Never Used  Substance and Sexual Activity  . Alcohol use: Yes    Alcohol/week: 0.0 standard drinks    Comment: One drink every 6 months.  . Drug use: No  . Sexual activity: Not on file  Other Topics Concern  .  Not on file  Social History Narrative  . Not on file   Social Determinants of Health   Financial Resource Strain:   . Difficulty of Paying Living Expenses: Not on file  Food Insecurity:   . Worried About Charity fundraiser in the Last Year: Not on file  . Ran Out of Food in the Last Year: Not on file  Transportation Needs:   . Lack of Transportation (Medical): Not on file  . Lack of Transportation (Non-Medical): Not on file  Physical Activity:   . Days of Exercise per Week: Not on file  . Minutes of Exercise per Session: Not on file  Stress:   . Feeling of Stress : Not on file  Social Connections:   . Frequency of Communication with Friends and Family: Not on file  . Frequency of Social Gatherings with Friends and Family:  Not on file  . Attends Religious Services: Not on file  . Active Member of Clubs or Organizations: Not on file  . Attends Archivist Meetings: Not on file  . Marital Status: Not on file  Intimate Partner Violence:   . Fear of Current or Ex-Partner: Not on file  . Emotionally Abused: Not on file  . Physically Abused: Not on file  . Sexually Abused: Not on file    Review of Systems: See HPI, otherwise negative ROS  Physical Exam: BP (!) 137/94   Pulse 97   Temp 99.4 F (37.4 C) (Oral)   Resp 12   Ht 6' (1.829 m)   Wt 70.3 kg   SpO2 99%   BMI 21.02 kg/m  General:   Alert,  Well-developed, well-nourished, pleasant and cooperative in NAD Neck:  Supple; no masses or thyromegaly. No significant cervical adenopathy. Lungs:  Clear throughout to auscultation.   No wheezes, crackles, or rhonchi. No acute distress. Heart:  Regular rate and rhythm; no murmurs, clicks, rubs,  or gallops. Abdomen: Non-distended, normal bowel sounds.  Soft and nontender without appreciable mass or hepatosplenomegaly.  Pulses:  Normal pulses noted. Extremities:  Without clubbing or edema.  Impression/Plan: 64 year old gentleman here for surveillance colonoscopy.  History of multiple colonic adenomas removed previously.  The risks, benefits, limitations, alternatives and imponderables have been reviewed with the patient. Questions have been answered. All parties are agreeable.      Notice: This dictation was prepared with Dragon dictation along with smaller phrase technology. Any transcriptional errors that result from this process are unintentional and may not be corrected upon review.

## 2019-10-14 NOTE — Discharge Instructions (Signed)
Colonoscopy Discharge Instructions  Read the instructions outlined below and refer to this sheet in the next few weeks. These discharge instructions provide you with general information on caring for yourself after you leave the hospital. Your doctor may also give you specific instructions. While your treatment has been planned according to the most current medical practices available, unavoidable complications occasionally occur. If you have any problems or questions after discharge, call Dr. Gala Romney at [phone]. ACTIVITY  You may resume your regular activity, but move at a slower pace for the next 24 hours.   Take frequent rest periods for the next 24 hours.   Walking will help get rid of the air and reduce the bloated feeling in your belly (abdomen).   No driving for 24 hours (because of the medicine (anesthesia) used during the test).    Do not sign any important legal documents or operate any machinery for 24 hours (because of the anesthesia used during the test).  NUTRITION  Drink plenty of fluids.   You may resume your normal diet as instructed by your doctor.   Begin with a light meal and progress to your normal diet. Heavy or fried foods are harder to digest and may make you feel sick to your stomach (nauseated).   Avoid alcoholic beverages for 24 hours or as instructed.  MEDICATIONS  You may resume your normal medications unless your doctor tells you otherwise.  WHAT YOU CAN EXPECT TODAY  Some feelings of bloating in the abdomen.   Passage of more gas than usual.   Spotting of blood in your stool or on the toilet paper.  IF YOU HAD POLYPS REMOVED DURING THE COLONOSCOPY:  No aspirin products for 7 days or as instructed.   No alcohol for 7 days or as instructed.   Eat a soft diet for the next 24 hours.  FINDING OUT THE RESULTS OF YOUR TEST Not all test results are available during your visit. If your test results are not back during the visit, make an appointment  with your caregiver to find out the results. Do not assume everything is normal if you have not heard from your caregiver or the medical facility. It is important for you to follow up on all of your test results.  SEEK IMMEDIATE MEDICAL ATTENTION IF:  You have more than a spotting of blood in your stool.   Your belly is swollen (abdominal distention).   You are nauseated or vomiting.   You have a temperature over 101.   You have abdominal pain or discomfort that is severe or gets worse throughout the day.   Colon polyp information provided  2 small polyps removed from your colon today  Further recommendations to follow pending review of pathology report  At patient request, I called wife, Anne Ng, at [phone], and reviewed results    Colon Polyps  Polyps are tissue growths inside the body. Polyps can grow in many places, including the large intestine (colon). A polyp may be a round bump or a mushroom-shaped growth. You could have one polyp or several. Most colon polyps are noncancerous (benign). However, some colon polyps can become cancerous over time. Finding and removing the polyps early can help prevent this. What are the causes? The exact cause of colon polyps is not known. What increases the risk? You are more likely to develop this condition if you:  Have a family history of colon cancer or colon polyps.  Are older than 50 or older than 45 if  you are African American.  Have inflammatory bowel disease, such as ulcerative colitis or Crohn's disease.  Have certain hereditary conditions, such as: ? Familial adenomatous polyposis. ? Lynch syndrome. ? Turcot syndrome. ? Peutz-Jeghers syndrome.  Are overweight.  Smoke cigarettes.  Do not get enough exercise.  Drink too much alcohol.  Eat a diet that is high in fat and red meat and low in fiber.  Had childhood cancer that was treated with abdominal radiation. What are the signs or symptoms? Most polyps do  not cause symptoms. If you have symptoms, they may include:  Blood coming from your rectum when having a bowel movement.  Blood in your stool. The stool may look dark red or black.  Abdominal pain.  A change in bowel habits, such as constipation or diarrhea. How is this diagnosed? This condition is diagnosed with a colonoscopy. This is a procedure in which a lighted, flexible scope is inserted into the anus and then passed into the colon to examine the area. Polyps are sometimes found when a colonoscopy is done as part of routine cancer screening tests. How is this treated? Treatment for this condition involves removing any polyps that are found. Most polyps can be removed during a colonoscopy. Those polyps will then be tested for cancer. Additional treatment may be needed depending on the results of testing. Follow these instructions at home: Lifestyle  Maintain a healthy weight, or lose weight if recommended by your health care provider.  Exercise every day or as told by your health care provider.  Do not use any products that contain nicotine or tobacco, such as cigarettes and e-cigarettes. If you need help quitting, ask your health care provider.  If you drink alcohol, limit how much you have: ? 0-1 drink a day for women. ? 0-2 drinks a day for men.  Be aware of how much alcohol is in your drink. In the [place] one drink equals one 12 oz bottle of beer (355 mL), one 5 oz glass of wine (148 mL), or one 1 oz shot of hard liquor (44 mL). Eating and drinking   Eat foods that are high in fiber, such as fruits, vegetables, and whole grains.  Eat foods that are high in calcium and vitamin D, such as milk, cheese, yogurt, eggs, liver, fish, and broccoli.  Limit foods that are high in fat, such as fried foods and desserts.  Limit the amount of red meat and processed meat you eat, such as hot dogs, sausage, bacon, and lunch meats. General instructions  Keep all follow-up visits as  told by your health care provider. This is important. ? This includes having regularly scheduled colonoscopies. ? Talk to your health care provider about when you need a colonoscopy. Contact a health care provider if:  You have new or worsening bleeding during a bowel movement.  You have new or increased blood in your stool.  You have a change in bowel habits.  You lose weight for no known reason. Summary  Polyps are tissue growths inside the body. Polyps can grow in many places, including the colon.  Most colon polyps are noncancerous (benign), but some can become cancerous over time.  This condition is diagnosed with a colonoscopy.  Treatment for this condition involves removing any polyps that are found. Most polyps can be removed during a colonoscopy. This information is not intended to replace advice given to you by your health care provider. Make sure you discuss any questions you have with your health  care provider. Document Revised: 01/09/2018 Document Reviewed: 01/09/2018 Elsevier Patient Education  [place].

## 2019-10-15 ENCOUNTER — Encounter: Payer: Self-pay | Admitting: Internal Medicine

## 2019-10-15 LAB — SURGICAL PATHOLOGY

## 2019-11-06 ENCOUNTER — Telehealth: Payer: Self-pay | Admitting: Family Medicine

## 2019-11-09 ENCOUNTER — Encounter: Payer: Self-pay | Admitting: Family Medicine

## 2019-11-09 ENCOUNTER — Ambulatory Visit (INDEPENDENT_AMBULATORY_CARE_PROVIDER_SITE_OTHER): Payer: 59 | Admitting: Family Medicine

## 2019-11-09 VITALS — BP 148/82 | HR 90 | Temp 98.4°F | Resp 20 | Ht 72.0 in | Wt 163.0 lb

## 2019-11-09 DIAGNOSIS — E119 Type 2 diabetes mellitus without complications: Secondary | ICD-10-CM

## 2019-11-09 DIAGNOSIS — E785 Hyperlipidemia, unspecified: Secondary | ICD-10-CM | POA: Diagnosis not present

## 2019-11-09 DIAGNOSIS — E782 Mixed hyperlipidemia: Secondary | ICD-10-CM | POA: Insufficient documentation

## 2019-11-09 DIAGNOSIS — E1159 Type 2 diabetes mellitus with other circulatory complications: Secondary | ICD-10-CM

## 2019-11-09 DIAGNOSIS — I152 Hypertension secondary to endocrine disorders: Secondary | ICD-10-CM

## 2019-11-09 DIAGNOSIS — F339 Major depressive disorder, recurrent, unspecified: Secondary | ICD-10-CM | POA: Diagnosis not present

## 2019-11-09 DIAGNOSIS — I10 Essential (primary) hypertension: Secondary | ICD-10-CM | POA: Diagnosis not present

## 2019-11-09 DIAGNOSIS — E1169 Type 2 diabetes mellitus with other specified complication: Secondary | ICD-10-CM | POA: Insufficient documentation

## 2019-11-09 DIAGNOSIS — E559 Vitamin D deficiency, unspecified: Secondary | ICD-10-CM

## 2019-11-09 LAB — BAYER DCA HB A1C WAIVED: HB A1C (BAYER DCA - WAIVED): 5.9 % (ref <7.0)

## 2019-11-09 MED ORDER — VENLAFAXINE HCL ER 75 MG PO CP24: 75.0000 mg | ORAL_CAPSULE | Freq: Every day | ORAL | 2 refills | Status: DC

## 2019-11-09 MED FILL — VENLAFAXINE HCL ER 75 MG CA: 75 | 90 days supply | Qty: 90 | Fill #0

## 2019-11-09 NOTE — Progress Notes (Signed)
Subjective:  Patient ID: Bradley Goodman, male    DOB: 1956-03-12, 64 y.o.   MRN: [id number]  Patient Care Team: [name], FNP as PCP - General (Family Medicine) Gala Romney Cristopher Estimable, MD as Consulting Physician (Gastroenterology)   Chief Complaint:  Medical Management of Chronic Issues (4 mo ), Hypertension, and Hyperlipidemia   HPI: Bradley Goodman is a 64 y.o. male presenting on 11/09/2019 for Medical Management of Chronic Issues (4 mo ), Hypertension, and Hyperlipidemia   1. Type 2 diabetes mellitus without complication, without long-term current use of insulin (HCC) Diet controlled. Does not monitor blood sugars at home. Has been snacking more lately. No polyuria, polydipsia, or polyphagia. Works full time and is very active at work.   2. Hypertension associated with diabetes ([place]) Well controlled at home. No chest pain, leg swelling, headaches, palpitations, weakness, or confusion. Compliant with medications without associated side effects.   3. Depression, recurrent ([place]) Reports an increase in lack of interest and malaise over the last several months. States he just does not have the motivation to get up out of bed and get going. No SI or HI. Has been taking medication as prescribed without associated side effects.  Depression screen [facility] 2/9 11/09/2019 11/09/2019 07/08/2019 10/21/2018 09/18/2018  Decreased Interest 0 0 0 1 2  Down, Depressed, Hopeless 1 0 0 0 1  PHQ - 2 Score 1 0 0 1 3  Altered sleeping - - - - 1  Tired, decreased energy - - - - 1  Change in appetite - - - - 0  Feeling bad or failure about yourself  - - - - 0  Trouble concentrating - - - - 0  Moving slowly or fidgety/restless - - - - 1  Suicidal thoughts - - - - 0  PHQ-9 Score - - - - 6  Difficult doing work/chores - - - - Somewhat difficult     4. Hyperlipidemia associated with type 2 diabetes mellitus ([place]) Does try to watch diet. Has been snacking more lately. Chex cereal is his go to snack. Does take  stain as prescribed without associated side effects.   5. Vitamin D deficiency In repletion therapy daily. No myalgias or arthralgias.      Relevant past medical, surgical, family, and social history reviewed and updated as indicated.  Allergies and medications reviewed and updated. Date reviewed: Chart in Epic.   Past Medical History:  Diagnosis Date  . Cervical spondylolysis   . Essential hypertension   . GERD (gastroesophageal reflux disease)   . History of kidney stones   . History of migraine   . Hyperlipidemia   . PONV (postoperative nausea and vomiting)   . Type 2 diabetes mellitus ([place])     Past Surgical History:  Procedure Laterality Date  . Bilateral inguinal hernia repair    . COLONOSCOPY  01/23/2012   Procedure: COLONOSCOPY;  Surgeon: Daneil Dolin, MD;  [facility];  Service: Endoscopy;  Laterality: N/A;  9:30 AM  . COLONOSCOPY N/A 10/11/2015   Procedure: COLONOSCOPY;  Surgeon: Daneil Dolin, MD;  [facility];  Service: Endoscopy;  Laterality: N/A;  830  . COLONOSCOPY N/A 10/14/2019   Procedure: COLONOSCOPY;  Surgeon: Daneil Dolin, MD;  [facility];  Service: Endoscopy;  Laterality: N/A;  1:45  . POLYPECTOMY  10/14/2019   Procedure: POLYPECTOMY;  Surgeon: Daneil Dolin, MD;  [facility];  Service: Endoscopy;;  ascending  colon, descending colon    Social History   Socioeconomic History  . Marital status: Married    Spouse name: Not on file  . Number of children: Not on file  . Years of education: Not on file  . Highest education level: Not on file  Occupational History  . Not on file  Tobacco Use  . Smoking status: Current Every Day Smoker    Packs/day: 1.00    Years: 30.00    Pack years: 30.00    Types: Cigarettes    Start date: 10/30/1973  . Smokeless tobacco: Never Used  Substance and Sexual Activity  . Alcohol use: Yes    Alcohol/week: 0.0 standard drinks    Comment: One drink every 6 months.  .  Drug use: No  . Sexual activity: Not on file  Other Topics Concern  . Not on file  Social History Narrative  . Not on file   Social Determinants of Health   Financial Resource Strain:   . Difficulty of Paying Living Expenses: Not on file  Food Insecurity:   . Worried About Charity fundraiser in the Last Year: Not on file  . Ran Out of Food in the Last Year: Not on file  Transportation Needs:   . Lack of Transportation (Medical): Not on file  . Lack of Transportation (Non-Medical): Not on file  Physical Activity:   . Days of Exercise per Week: Not on file  . Minutes of Exercise per Session: Not on file  Stress:   . Feeling of Stress : Not on file  Social Connections:   . Frequency of Communication with Friends and Family: Not on file  . Frequency of Social Gatherings with Friends and Family: Not on file  . Attends Religious Services: Not on file  . Active Member of Clubs or Organizations: Not on file  . Attends Archivist Meetings: Not on file  . Marital Status: Not on file  Intimate Partner Violence:   . Fear of Current or Ex-Partner: Not on file  . Emotionally Abused: Not on file  . Physically Abused: Not on file  . Sexually Abused: Not on file    Outpatient Encounter Medications as of 11/09/2019  Medication Sig  . aspirin EC 81 MG tablet Take 81 mg by mouth every evening.  . cholecalciferol (VITAMIN D) 1000 UNITS tablet Take 2,000 Units by mouth every evening. Pt takes two tablets daily  . Krill Oil 300 MG CAPS Take 1 capsule by mouth every evening.  . loratadine (CLARITIN) 10 MG tablet Take 10 mg by mouth every evening.   Marland Kitchen losartan (COZAAR) 50 MG tablet Take 1.5 tablets (75 mg total) by mouth daily.  . pantoprazole (PROTONIX) 40 MG tablet Take 1 tablet (40 mg total) by mouth daily. (Patient taking differently: Take 40 mg by mouth every evening. )  . rosuvastatin (CRESTOR) 20 MG tablet Take 1 tablet (20 mg total) by mouth daily. (Patient taking differently:  Take 20 mg by mouth every evening. )  . [DISCONTINUED] venlafaxine XR (EFFEXOR-XR) 37.5 MG 24 hr capsule TAKE 1 CAPSULE BY MOUTH DAILY WITH BREAKFAST (Patient taking differently: Take 37.5 mg by mouth every evening. )  . venlafaxine XR (EFFEXOR XR) 75 MG 24 hr capsule Take 1 capsule (75 mg total) by mouth daily with breakfast.   No facility-administered encounter medications on file as of 11/09/2019.    No Known Allergies  Review of Systems  Constitutional: Positive for fatigue. Negative for activity change, appetite  change, chills, diaphoresis, fever and unexpected weight change.  HENT: Negative.   Eyes: Negative.  Negative for photophobia and visual disturbance.  Respiratory: Negative for cough, chest tightness and shortness of breath.   Cardiovascular: Negative for chest pain, palpitations and leg swelling.  Gastrointestinal: Negative for abdominal pain, blood in stool, constipation, diarrhea, nausea and vomiting.  Endocrine: Negative.  Negative for cold intolerance, heat intolerance, polydipsia, polyphagia and polyuria.  Genitourinary: Negative for decreased urine volume, difficulty urinating, dysuria, frequency and urgency.  Musculoskeletal: Negative for arthralgias and myalgias.  Skin: Negative.   Allergic/Immunologic: Negative.   Neurological: Negative for dizziness, tremors, seizures, syncope, facial asymmetry, speech difficulty, weakness, light-headedness, numbness and headaches.  Hematological: Negative.   Psychiatric/Behavioral: Positive for dysphoric mood. Negative for agitation, behavioral problems, confusion, decreased concentration, hallucinations, self-injury, sleep disturbance and suicidal ideas. The patient is not nervous/anxious and is not hyperactive.   All other systems reviewed and are negative.       Objective:  BP (!) 148/82 (BP Location: Left Arm, Cuff Size: Normal)   Pulse 90   Temp 98.4 F (36.9 C)   Resp 20   Ht 6' (1.829 m)   Wt 163 lb (73.9 kg)   SpO2  99%   BMI 22.11 kg/m    Wt Readings from Last 3 Encounters:  11/09/19 163 lb (73.9 kg)  10/14/19 155 lb (70.3 kg)  07/08/19 158 lb (71.7 kg)    Physical Exam Vitals and nursing note reviewed.  Constitutional:      General: He is not in acute distress.    Appearance: Normal appearance. He is well-developed, well-groomed and normal weight. He is not ill-appearing, toxic-appearing or diaphoretic.  HENT:     Head: Normocephalic and atraumatic.     Jaw: There is normal jaw occlusion.     Right Ear: Hearing, tympanic membrane, ear canal and external ear normal.     Left Ear: Hearing, tympanic membrane, ear canal and external ear normal.     Nose: Nose normal.     Mouth/Throat:     Lips: Pink.     Mouth: Mucous membranes are moist.     Pharynx: Oropharynx is clear. Uvula midline.  Eyes:     General: Lids are normal.     Extraocular Movements: Extraocular movements intact.     Conjunctiva/sclera: Conjunctivae normal.     Pupils: Pupils are equal, round, and reactive to light.  Neck:     Thyroid: No thyroid mass, thyromegaly or thyroid tenderness.     Vascular: No carotid bruit or JVD.     Trachea: Trachea and phonation normal.  Cardiovascular:     Rate and Rhythm: Normal rate and regular rhythm.     Chest Wall: PMI is not displaced.     Pulses: Normal pulses.     Heart sounds: Normal heart sounds. No murmur. No friction rub. No gallop.   Pulmonary:     Effort: Pulmonary effort is normal. No respiratory distress.     Breath sounds: Normal breath sounds. No wheezing.  Abdominal:     General: Bowel sounds are normal. There is no distension or abdominal bruit.     Palpations: Abdomen is soft. There is no hepatomegaly or splenomegaly.     Tenderness: There is no abdominal tenderness. There is no right CVA tenderness or left CVA tenderness.     Hernia: No hernia is present.  Musculoskeletal:        General: Normal range of motion.     Cervical back:  Normal range of motion and neck  supple.     Right lower leg: No edema.     Left lower leg: No edema.  Lymphadenopathy:     Cervical: No cervical adenopathy.  Skin:    General: Skin is warm and dry.     Capillary Refill: Capillary refill takes less than 2 seconds.     Coloration: Skin is not cyanotic, jaundiced or pale.     Findings: No rash.  Neurological:     General: No focal deficit present.     Mental Status: He is alert and oriented to person, place, and time.     Cranial Nerves: Cranial nerves are intact. No cranial nerve deficit.     Sensory: Sensation is intact. No sensory deficit.     Motor: Motor function is intact. No weakness.     Coordination: Coordination is intact. Coordination normal.     Gait: Gait is intact. Gait normal.     Deep Tendon Reflexes: Reflexes are normal and symmetric.  Psychiatric:        Attention and Perception: Attention and perception normal.        Mood and Affect: Mood and affect normal.        Speech: Speech normal.        Behavior: Behavior normal. Behavior is cooperative.        Thought Content: Thought content normal.        Cognition and Memory: Cognition and memory normal.        Judgment: Judgment normal.     Results for orders placed or performed during the hospital encounter of 10/14/19  Surgical pathology  Result Value Ref Range   SURGICAL PATHOLOGY      SURGICAL PATHOLOGY CASE: [id number] PATIENT: Bayard Males Surgical Pathology Report     Clinical History: Surveillance     FINAL MICROSCOPIC DIAGNOSIS:  A. COLON, ASCENDING, POLYPECTOMY: - Sessile serrated polyp without cytologic dysplasia.  B. COLON, DESCENDING, POLYPECTOMY: - Tubular adenoma without high grade dysplasia.  GROSS DESCRIPTION:  A: Received in formalin are tan, soft tissue fragments that are submitted in toto. Number: 2 Size: 0.3-0.8 cm Blocks: 1  B: Received in formalin is a tan, soft tissue fragment that is submitted in toto.  Size: 0.5 cm, 1 block submitted.   Craig Staggers  10/15/2019)    Final Diagnosis performed by Gillie Manners, MD.   Electronically signed 10/15/2019 Technical component performed at [facility] [address], [place], [place] [address].  Professional component performed at [employer]. [facility], [place] [address] [place], [place] [address].  Immunohistochemistry Technical component (if appl icable) was performed at [facility]. [address] [place], [place], [place] [address].   IMMUNOHISTOCHEMISTRY DISCLAIMER (if applicable): Some of these immunohistochemical stains may have been developed and the performance characteristics determine by [facility]. Some may not have been cleared or approved by the U.S. Food and Drug Administration. The FDA has determined that such clearance or approval is not necessary. This test is used for clinical purposes. It should not be regarded as investigational or for research. This laboratory is certified under the [place] (CLIA-88) as qualified to perform high complexity clinical laboratory testing.  The controls stained appropriately.        Pertinent labs & imaging results that were available during my care of the patient were reviewed by me and considered in my medical decision making.  Assessment & Plan:  Theseus was  seen today for medical management of chronic issues, hypertension and hyperlipidemia.  Diagnoses and all orders for this visit:  Type 2 diabetes mellitus without complication, without long-term current use of insulin (HCC) A1C 5.9 today.  Diet controlled. Continue with diet and exercise. Has been doing well. Diet and exercise encouraged.  -     CMP14+EGFR -     CBC with Differential/Platelet -     Lipid panel -     Thyroid Panel With TSH -     Bayer DCA Hb A1c Waived  Hypertension associated with diabetes (HCC) BP slightly elevated in office today, usually is due to white  coat syndrome. Well controlled at home. Will report any persistent high or low readings. Labs pending. Diet and exercise encouraged.  -     CMP14+EGFR -     CBC with Differential/Platelet -     Lipid panel -     Thyroid Panel With TSH  Depression, recurrent ([place]) Pt reports worsening depression and malaise. Will increase Effexor to 75 mg daily. Pt aware to report any new, worsening, or persistent symptoms. No SI or HI.  -     Thyroid Panel With TSH -     venlafaxine XR (EFFEXOR XR) 75 MG 24 hr capsule; Take 1 capsule (75 mg total) by mouth daily with breakfast.  Hyperlipidemia associated with type 2 diabetes mellitus ([place]) Diet encouraged - increase intake of fresh fruits and vegetables, increase intake of lean proteins. Bake, broil, or grill foods. Avoid fried, greasy, and fatty foods. Avoid fast foods. Increase intake of fiber-rich whole grains. Exercise encouraged - at least 150 minutes per week and advance as tolerated.  Goal BMI < 25. Continue medications as prescribed. Follow up in 3-6 months as discussed.  -     CMP14+EGFR -     Lipid panel  Vitamin D deficiency Labs pending. Continue repletion therapy. If indicated, will change repletion dosage. Eat foods rich in Vit D including milk, orange juice, yogurt with vitamin D added, salmon or mackerel, canned tuna fish, cereals with vitamin D added, and cod liver oil. Get out in the sun but make sure to wear at least SPF 30 sunscreen.  -     VITAMIN D 25 Hydroxy (Vit-D Deficiency, Fractures)     Continue all other maintenance medications.  Follow up plan: Return in about 4 years (around 11/09/2023), or if symptoms worsen or fail to improve, for DM.  Continue healthy lifestyle choices, including diet (rich in fruits, vegetables, and lean proteins, and low in salt and simple carbohydrates) and exercise (at least 30 minutes of moderate physical activity daily).  Educational handout given for DM  The above assessment and management plan  was discussed with the patient. The patient verbalized understanding of and has agreed to the management plan. Patient is aware to call the clinic if they develop any new symptoms or if symptoms persist or worsen. Patient is aware when to return to the clinic for a follow-up visit. Patient educated on when it is appropriate to go to the emergency department.   Monia Pouch, FNP-C [facility] [phone]

## 2019-11-09 NOTE — Patient Instructions (Signed)

## 2019-11-10 LAB — CBC WITH DIFFERENTIAL/PLATELET
Basophils Absolute: 0.1 10*3/uL (ref 0.0–0.2)
Basos: 1 %
EOS (ABSOLUTE): 0.1 10*3/uL (ref 0.0–0.4)
Eos: 1 %
Hematocrit: 45.1 % (ref 37.5–51.0)
Hemoglobin: 15.3 g/dL (ref 13.0–17.7)
Immature Grans (Abs): 0 10*3/uL (ref 0.0–0.1)
Immature Granulocytes: 0 %
Lymphocytes Absolute: 1.8 10*3/uL (ref 0.7–3.1)
Lymphs: 20 %
MCH: 30.1 pg (ref 26.6–33.0)
MCHC: 33.9 g/dL (ref 31.5–35.7)
MCV: 89 fL (ref 79–97)
Monocytes Absolute: 0.6 10*3/uL (ref 0.1–0.9)
Monocytes: 7 %
Neutrophils Absolute: 6.3 10*3/uL (ref 1.4–7.0)
Neutrophils: 71 %
Platelets: 226 10*3/uL (ref 150–450)
RBC: 5.08 x10E6/uL (ref 4.14–5.80)
RDW: 13 % (ref 11.6–15.4)
WBC: 8.9 10*3/uL (ref 3.4–10.8)

## 2019-11-10 LAB — CMP14+EGFR
ALT: 17 IU/L (ref 0–44)
AST: 17 IU/L (ref 0–40)
Albumin/Globulin Ratio: 2 (ref 1.2–2.2)
Albumin: 4.2 g/dL (ref 3.8–4.8)
Alkaline Phosphatase: 83 IU/L (ref 39–117)
BUN/Creatinine Ratio: 15 (ref 10–24)
BUN: 14 mg/dL (ref 8–27)
Bilirubin Total: 0.3 mg/dL (ref 0.0–1.2)
CO2: 25 mmol/L (ref 20–29)
Calcium: 9.4 mg/dL (ref 8.6–10.2)
Chloride: 107 mmol/L — ABNORMAL HIGH (ref 96–106)
Creatinine, Ser: 0.95 mg/dL (ref 0.76–1.27)
GFR calc Af Amer: 99 mL/min/{1.73_m2} (ref >59)
GFR calc non Af Amer: 85 mL/min/{1.73_m2} (ref >59)
Globulin, Total: 2.1 g/dL (ref 1.5–4.5)
Glucose: 95 mg/dL (ref 65–99)
Potassium: 4.8 mmol/L (ref 3.5–5.2)
Sodium: 145 mmol/L — ABNORMAL HIGH (ref 134–144)
Total Protein: 6.3 g/dL (ref 6.0–8.5)

## 2019-11-10 LAB — LIPID PANEL
Chol/HDL Ratio: 2.7 ratio (ref 0.0–5.0)
Cholesterol, Total: 91 mg/dL — ABNORMAL LOW (ref 100–199)
HDL: 34 mg/dL — ABNORMAL LOW (ref >39)
LDL Chol Calc (NIH): 38 mg/dL (ref 0–99)
Triglycerides: 98 mg/dL (ref 0–149)
VLDL Cholesterol Cal: 19 mg/dL (ref 5–40)

## 2019-11-10 LAB — THYROID PANEL WITH TSH
Free Thyroxine Index: 2.2 (ref 1.2–4.9)
T3 Uptake Ratio: 26 % (ref 24–39)
T4, Total: 8.3 ug/dL (ref 4.5–12.0)
TSH: 2.9 u[IU]/mL (ref 0.450–4.500)

## 2019-11-10 LAB — VITAMIN D 25 HYDROXY (VIT D DEFICIENCY, FRACTURES): Vit D, 25-Hydroxy: 67.5 ng/mL (ref 30.0–100.0)

## 2019-12-07 MED FILL — LOSARTAN POTASSIUM 50 MG TA: 50 | 90 days supply | Qty: 135 | Fill #1

## 2019-12-10 MED FILL — PANTOPRAZOLE SOD DR 40 MG T: 40 | 90 days supply | Qty: 90 | Fill #1

## 2019-12-10 MED FILL — VENLAFAXINE HCL ER 37.5 MG: 37.5 | 90 days supply | Qty: 90 | Fill #1

## 2019-12-29 MED FILL — ROSUVASTATIN CALCIUM 20 MG: 20 | 90 days supply | Qty: 90 | Fill #2

## 2020-01-15 ENCOUNTER — Encounter: Payer: Self-pay | Admitting: *Deleted

## 2020-03-09 ENCOUNTER — Ambulatory Visit: Payer: 59 | Admitting: Family Medicine

## 2020-03-09 ENCOUNTER — Ambulatory Visit: Payer: 59 | Admitting: Family

## 2020-03-09 ENCOUNTER — Other Ambulatory Visit: Payer: Self-pay

## 2020-03-09 ENCOUNTER — Other Ambulatory Visit: Payer: Self-pay | Admitting: Family

## 2020-03-09 ENCOUNTER — Encounter: Payer: Self-pay | Admitting: Family

## 2020-03-09 VITALS — BP 150/90 | HR 63 | Temp 98.2°F | Ht 72.0 in | Wt 163.4 lb

## 2020-03-09 DIAGNOSIS — E1169 Type 2 diabetes mellitus with other specified complication: Secondary | ICD-10-CM | POA: Diagnosis not present

## 2020-03-09 DIAGNOSIS — E1159 Type 2 diabetes mellitus with other circulatory complications: Secondary | ICD-10-CM

## 2020-03-09 DIAGNOSIS — E785 Hyperlipidemia, unspecified: Secondary | ICD-10-CM | POA: Diagnosis not present

## 2020-03-09 DIAGNOSIS — F339 Major depressive disorder, recurrent, unspecified: Secondary | ICD-10-CM | POA: Diagnosis not present

## 2020-03-09 DIAGNOSIS — I152 Hypertension secondary to endocrine disorders: Secondary | ICD-10-CM

## 2020-03-09 DIAGNOSIS — E559 Vitamin D deficiency, unspecified: Secondary | ICD-10-CM

## 2020-03-09 DIAGNOSIS — K219 Gastro-esophageal reflux disease without esophagitis: Secondary | ICD-10-CM | POA: Diagnosis not present

## 2020-03-09 DIAGNOSIS — Z1159 Encounter for screening for other viral diseases: Secondary | ICD-10-CM | POA: Diagnosis not present

## 2020-03-09 DIAGNOSIS — I10 Essential (primary) hypertension: Secondary | ICD-10-CM

## 2020-03-09 DIAGNOSIS — E119 Type 2 diabetes mellitus without complications: Secondary | ICD-10-CM

## 2020-03-09 LAB — BAYER DCA HB A1C WAIVED: HB A1C (BAYER DCA - WAIVED): 5.9 % (ref <7.0)

## 2020-03-09 MED ORDER — PANTOPRAZOLE SODIUM 40 MG PO TBEC: 40.0000 mg | DELAYED_RELEASE_TABLET | Freq: Every evening | ORAL | 4 refills | Status: DC

## 2020-03-09 MED ORDER — VENLAFAXINE HCL ER 75 MG PO CP24: 75.0000 mg | ORAL_CAPSULE | Freq: Every day | ORAL | 2 refills | Status: DC

## 2020-03-09 MED ORDER — ROSUVASTATIN CALCIUM 20 MG PO TABS: 20.0000 mg | ORAL_TABLET | Freq: Every day | ORAL | 3 refills | Status: DC

## 2020-03-09 MED ORDER — LOSARTAN POTASSIUM 50 MG PO TABS: 75.0000 mg | ORAL_TABLET | Freq: Every day | ORAL | 1 refills | Status: DC

## 2020-03-09 MED FILL — VENLAFAXINE HCL ER 75 MG CA: 75 | 90 days supply | Qty: 90 | Fill #0

## 2020-03-09 MED FILL — LOSARTAN POTASSIUM 50 MG TA: 50 | 90 days supply | Qty: 135 | Fill #0

## 2020-03-09 MED FILL — PANTOPRAZOLE SOD DR 40 MG T: 40 | 90 days supply | Qty: 90 | Fill #0

## 2020-03-09 NOTE — Patient Instructions (Signed)

## 2020-03-09 NOTE — Progress Notes (Signed)
Subjective:    Patient ID: Bradley Goodman, male    DOB: [birth year], 64 y.o.   MRN: [id number]  Chief Complaint  Patient presents with  . Medical Management of Chronic Issues    rakes patient    Pt presents to the office today to establish care with me.  Diabetes He presents for his follow-up diabetic visit. He has type 2 diabetes mellitus. His disease course has been stable. There are no hypoglycemic associated symptoms. Pertinent negatives for diabetes include no blurred vision, no foot paresthesias and no weakness. Symptoms are stable. Pertinent negatives for diabetic complications include no CVA, heart disease, nephropathy or peripheral neuropathy. Risk factors for coronary artery disease include dyslipidemia, diabetes mellitus, male sex, hypertension and sedentary lifestyle. He is following a generally healthy diet. (Does not check his BS at home ) An ACE inhibitor/angiotensin II receptor blocker is being taken. Eye exam is current.  Hypertension This is a chronic problem. The current episode started more than 1 year ago. The problem has been waxing and waning since onset. The problem is uncontrolled. Pertinent negatives include no blurred vision, malaise/fatigue, peripheral edema or shortness of breath. Risk factors for coronary artery disease include dyslipidemia, obesity, male gender, sedentary lifestyle and smoking/tobacco exposure. The current treatment provides moderate improvement. There is no history of CVA.  Gastroesophageal Reflux He complains of belching and heartburn. This is a chronic problem. The current episode started more than 1 year ago. The problem occurs occasionally. The problem has been waxing and waning. Risk factors include smoking/tobacco exposure. He has tried a PPI for the symptoms. The treatment provided moderate relief.  Hyperlipidemia This is a chronic problem. The current episode started more than 1 year ago. The problem is controlled. Recent lipid tests were  reviewed and are normal. Pertinent negatives include no shortness of breath. Current antihyperlipidemic treatment includes statins. The current treatment provides moderate improvement of lipids. Risk factors for coronary artery disease include dyslipidemia, diabetes mellitus, male sex, hypertension and a sedentary lifestyle.  Depression        This is a chronic problem.  The current episode started more than 1 year ago.   The onset quality is gradual.   The problem occurs intermittently.  The problem has been waxing and waning since onset.  Associated symptoms include irritable, restlessness and sad.  Associated symptoms include no helplessness and no hopelessness.  Past treatments include SNRIs - Serotonin and norepinephrine reuptake inhibitors.     Review of Systems  Constitutional: Negative for malaise/fatigue.  Eyes: Negative for blurred vision.  Respiratory: Negative for shortness of breath.   Gastrointestinal: Positive for heartburn.  Neurological: Negative for weakness.  Psychiatric/Behavioral: Positive for depression.  All other systems reviewed and are negative.      Objective:   Physical Exam Vitals reviewed.  Constitutional:      General: He is irritable. He is not in acute distress.    Appearance: He is well-developed.  HENT:     Head: Normocephalic.     Right Ear: Tympanic membrane normal.     Left Ear: Tympanic membrane normal.  Eyes:     General:        Right eye: No discharge.        Left eye: No discharge.     Pupils: Pupils are equal, round, and reactive to light.  Neck:     Thyroid: No thyromegaly.  Cardiovascular:     Rate and Rhythm: Normal rate and regular rhythm.  Heart sounds: Normal heart sounds. No murmur.  Pulmonary:     Effort: Pulmonary effort is normal. No respiratory distress.     Breath sounds: Normal breath sounds. No wheezing.  Abdominal:     General: Bowel sounds are normal. There is no distension.     Palpations: Abdomen is soft.      Tenderness: There is no abdominal tenderness.  Musculoskeletal:        General: No tenderness. Normal range of motion.     Cervical back: Normal range of motion and neck supple.  Skin:    General: Skin is warm and dry.     Findings: No erythema or rash.  Neurological:     Mental Status: He is alert and oriented to person, place, and time.     Cranial Nerves: No cranial nerve deficit.     Deep Tendon Reflexes: Reflexes are normal and symmetric.  Psychiatric:        Behavior: Behavior normal.        Thought Content: Thought content normal.        Judgment: Judgment normal.      BP (!) 150/90   Pulse 63   Temp 98.2 F (36.8 C) (Oral)   Ht 6' (1.829 m)   Wt 163 lb 6.4 oz (74.1 kg)   SpO2 97%   BMI 22.16 kg/m       Assessment & Plan:  JERAMI TAMMEN comes in today with chief complaint of Medical Management of Chronic Issues (rakes patient )   Diagnosis and orders addressed:  1. Hypertension associated with diabetes ([place]) - losartan (COZAAR) 50 MG tablet; Take 1.5 tablets (75 mg total) by mouth daily.  Dispense: 135 tablet; Refill: 1 - CMP14+EGFR  2. Hyperlipidemia associated with type 2 diabetes mellitus (HCC) - rosuvastatin (CRESTOR) 20 MG tablet; Take 1 tablet (20 mg total) by mouth daily.  Dispense: 90 tablet; Refill: 3 - CMP14+EGFR  3. Gastroesophageal reflux disease without esophagitis - pantoprazole (PROTONIX) 40 MG tablet; Take 1 tablet (40 mg total) by mouth every evening.  Dispense: 90 tablet; Refill: 4 - CMP14+EGFR  4. Depression, recurrent (HCC) - venlafaxine XR (EFFEXOR XR) 75 MG 24 hr capsule; Take 1 capsule (75 mg total) by mouth daily with breakfast.  Dispense: 90 capsule; Refill: 2 - CMP14+EGFR  5. Type 2 diabetes mellitus without complication, without long-term current use of insulin (HCC) - Bayer DCA Hb A1c Waived - CMP14+EGFR - Microalbumin / creatinine urine ratio  6. Hyperlipidemia, unspecified hyperlipidemia type - CMP14+EGFR  7. Vitamin  D deficiency - CMP14+EGFR  8. Need for hepatitis C screening test - CMP14+EGFR - Hepatitis C antibody   Labs pending Health Maintenance reviewed Diet and exercise encouraged  Follow up plan: 6 months    Evelina Dun, FNP

## 2020-03-10 LAB — CMP14+EGFR
ALT: 19 IU/L (ref 0–44)
AST: 16 IU/L (ref 0–40)
Albumin/Globulin Ratio: 2.2 (ref 1.2–2.2)
Albumin: 4.4 g/dL (ref 3.8–4.8)
Alkaline Phosphatase: 92 IU/L (ref 48–121)
BUN/Creatinine Ratio: 13 (ref 10–24)
BUN: 13 mg/dL (ref 8–27)
Bilirubin Total: 0.2 mg/dL (ref 0.0–1.2)
CO2: 25 mmol/L (ref 20–29)
Calcium: 9.8 mg/dL (ref 8.6–10.2)
Chloride: 103 mmol/L (ref 96–106)
Creatinine, Ser: 1.02 mg/dL (ref 0.76–1.27)
GFR calc Af Amer: 91 mL/min/{1.73_m2} (ref >59)
GFR calc non Af Amer: 78 mL/min/{1.73_m2} (ref >59)
Globulin, Total: 2 g/dL (ref 1.5–4.5)
Glucose: 112 mg/dL — ABNORMAL HIGH (ref 65–99)
Potassium: 4.8 mmol/L (ref 3.5–5.2)
Sodium: 141 mmol/L (ref 134–144)
Total Protein: 6.4 g/dL (ref 6.0–8.5)

## 2020-03-10 LAB — MICROALBUMIN / CREATININE URINE RATIO
Creatinine, Urine: 45.6 mg/dL
Microalb/Creat Ratio: 11 mg/g creat (ref 0–29)
Microalbumin, Urine: 4.8 ug/mL

## 2020-03-10 LAB — HEPATITIS C ANTIBODY: Hep C Virus Ab: <0.1 s/co ratio (ref 0.0–0.9)

## 2020-03-28 MED FILL — ROSUVASTATIN CALCIUM 20 MG: 20 | 90 days supply | Qty: 90 | Fill #3

## 2020-05-13 ENCOUNTER — Telehealth: Payer: Self-pay | Admitting: *Deleted

## 2020-05-13 NOTE — Telephone Encounter (Signed)
This is fine with me   

## 2020-05-13 NOTE — Telephone Encounter (Signed)
Pts wife is a pt of Hendricks Limes.  They would like to both see the same provider.  He wishes to switch from [place] to [place].  I have made his 4 mo f/u with [facility]. (they like to come back to back)  Please let me me know if there is any objection.  (send to [employer], lpn)   Thank you

## 2020-05-13 NOTE — Telephone Encounter (Signed)
Fine by me 

## 2020-06-01 MED FILL — VENLAFAXINE HCL ER 75 MG CA: 75 | 90 days supply | Qty: 90 | Fill #1

## 2020-06-01 MED FILL — PANTOPRAZOLE SOD DR 40 MG T: 40 | 90 days supply | Qty: 90 | Fill #1

## 2020-06-01 MED FILL — LOSARTAN POTASSIUM 50 MG TA: 50 | 90 days supply | Qty: 135 | Fill #1

## 2020-06-27 ENCOUNTER — Other Ambulatory Visit: Payer: Self-pay | Admitting: *Deleted

## 2020-06-27 DIAGNOSIS — E1169 Type 2 diabetes mellitus with other specified complication: Secondary | ICD-10-CM

## 2020-06-27 MED ORDER — ROSUVASTATIN CALCIUM 20 MG PO TABS: 20.0000 mg | ORAL_TABLET | Freq: Every day | ORAL | 0 refills | Status: DC

## 2020-06-27 MED FILL — ROSUVASTATIN CALCIUM 20 MG: 20 | 90 days supply | Qty: 90 | Fill #0

## 2020-07-14 ENCOUNTER — Other Ambulatory Visit: Payer: Self-pay

## 2020-07-14 ENCOUNTER — Ambulatory Visit (INDEPENDENT_AMBULATORY_CARE_PROVIDER_SITE_OTHER): Payer: 59 | Admitting: Family Medicine

## 2020-07-14 ENCOUNTER — Encounter: Payer: Self-pay | Admitting: Family Medicine

## 2020-07-14 VITALS — BP 132/86 | HR 109 | Temp 97.6°F | Ht 73.0 in | Wt 164.0 lb

## 2020-07-14 DIAGNOSIS — Z0000 Encounter for general adult medical examination without abnormal findings: Secondary | ICD-10-CM

## 2020-07-14 DIAGNOSIS — E559 Vitamin D deficiency, unspecified: Secondary | ICD-10-CM

## 2020-07-14 DIAGNOSIS — F339 Major depressive disorder, recurrent, unspecified: Secondary | ICD-10-CM | POA: Diagnosis not present

## 2020-07-14 DIAGNOSIS — E782 Mixed hyperlipidemia: Secondary | ICD-10-CM

## 2020-07-14 DIAGNOSIS — E119 Type 2 diabetes mellitus without complications: Secondary | ICD-10-CM | POA: Diagnosis not present

## 2020-07-14 DIAGNOSIS — R7303 Prediabetes: Secondary | ICD-10-CM

## 2020-07-14 DIAGNOSIS — Z0001 Encounter for general adult medical examination with abnormal findings: Secondary | ICD-10-CM

## 2020-07-14 DIAGNOSIS — Z720 Tobacco use: Secondary | ICD-10-CM | POA: Diagnosis not present

## 2020-07-14 DIAGNOSIS — K219 Gastro-esophageal reflux disease without esophagitis: Secondary | ICD-10-CM

## 2020-07-14 DIAGNOSIS — I10 Essential (primary) hypertension: Secondary | ICD-10-CM

## 2020-07-14 LAB — CMP14+EGFR
ALT: 18 IU/L (ref 0–44)
AST: 16 IU/L (ref 0–40)
Albumin/Globulin Ratio: 1.6 (ref 1.2–2.2)
Albumin: 4.1 g/dL (ref 3.8–4.8)
Alkaline Phosphatase: 90 IU/L (ref 44–121)
BUN/Creatinine Ratio: 16 (ref 10–24)
BUN: 18 mg/dL (ref 8–27)
Bilirubin Total: <0.2 mg/dL (ref 0.0–1.2)
CO2: 25 mmol/L (ref 20–29)
Calcium: 9.6 mg/dL (ref 8.6–10.2)
Chloride: 104 mmol/L (ref 96–106)
Creatinine, Ser: 1.1 mg/dL (ref 0.76–1.27)
GFR calc Af Amer: 82 mL/min/{1.73_m2} (ref >59)
GFR calc non Af Amer: 71 mL/min/{1.73_m2} (ref >59)
Globulin, Total: 2.5 g/dL (ref 1.5–4.5)
Glucose: 109 mg/dL — ABNORMAL HIGH (ref 65–99)
Potassium: 5.4 mmol/L — ABNORMAL HIGH (ref 3.5–5.2)
Sodium: 141 mmol/L (ref 134–144)
Total Protein: 6.6 g/dL (ref 6.0–8.5)

## 2020-07-14 LAB — CBC WITH DIFFERENTIAL/PLATELET
Basophils Absolute: 0.1 10*3/uL (ref 0.0–0.2)
Basos: 1 %
EOS (ABSOLUTE): 0.1 10*3/uL (ref 0.0–0.4)
Eos: 1 %
Hematocrit: 46.1 % (ref 37.5–51.0)
Hemoglobin: 14.8 g/dL (ref 13.0–17.7)
Immature Grans (Abs): 0 10*3/uL (ref 0.0–0.1)
Immature Granulocytes: 0 %
Lymphocytes Absolute: 2.2 10*3/uL (ref 0.7–3.1)
Lymphs: 22 %
MCH: 28.5 pg (ref 26.6–33.0)
MCHC: 32.1 g/dL (ref 31.5–35.7)
MCV: 89 fL (ref 79–97)
Monocytes Absolute: 0.6 10*3/uL (ref 0.1–0.9)
Monocytes: 6 %
Neutrophils Absolute: 6.9 10*3/uL (ref 1.4–7.0)
Neutrophils: 70 %
Platelets: 235 10*3/uL (ref 150–450)
RBC: 5.19 x10E6/uL (ref 4.14–5.80)
RDW: 13.3 % (ref 11.6–15.4)
WBC: 9.9 10*3/uL (ref 3.4–10.8)

## 2020-07-14 LAB — LIPID PANEL
Chol/HDL Ratio: 2.6 ratio (ref 0.0–5.0)
Cholesterol, Total: 90 mg/dL — ABNORMAL LOW (ref 100–199)
HDL: 35 mg/dL — ABNORMAL LOW (ref >39)
LDL Chol Calc (NIH): 36 mg/dL (ref 0–99)
Triglycerides: 96 mg/dL (ref 0–149)
VLDL Cholesterol Cal: 19 mg/dL (ref 5–40)

## 2020-07-14 LAB — BAYER DCA HB A1C WAIVED: HB A1C (BAYER DCA - WAIVED): 5.9 % (ref <7.0)

## 2020-07-14 NOTE — Progress Notes (Signed)
 Assessment & Plan:  1. Well adult exam - Preventive health education provided. - CBC with Differential/Platelet - CMP14+EGFR - Lipid panel  2. Prediabetes - Well controlled without medication. - Bayer DCA Hb A1c Waived  3. Essential hypertension - Well controlled on current regimen.  - CMP14+EGFR - Lipid panel  4. Mixed hyperlipidemia - Taking krill oil and rosuvastatin daily.  - CMP14+EGFR - Lipid panel  5. Depression, recurrent (HCC) - Well controlled on current regimen.  - CMP14+EGFR  6. Gastroesophageal reflux disease without esophagitis - Well controlled on current regimen.  - CMP14+EGFR  7. Vitamin D deficiency - Well controlled on current regimen.   8. Tobacco use - CBC with Differential/Platelet - CMP14+EGFR - Lipid panel   Follow-up: Return in about 6 months (around 01/12/2021) for follow-up of chronic medication conditions.   [name], MSN, APRN, FNP-C [facility]  Subjective:  Patient ID: Bradley Goodman, male    DOB: 03/16/1956  Age: 64 y.o. MRN: [id number]  Patient Care Team: Joyce, Britney F, FNP as PCP - General (Family Medicine) Rourk, Robert M, MD as Consulting Physician (Gastroenterology)   CC:  Chief Complaint  Patient presents with  . Annual Exam    HPI Bradley Goodman presents for his annual physical.   Occupation: administration at [employer], Marital status: married, Substance use: none Last eye exam: December 2020 Last colonoscopy: 10/14/2019 Hepatitis C Screening: 03/09/2020 PSA: 2015 Immunizations: Flu Vaccine: declined Tdap Vaccine: up to date  Shingrix Vaccine: will check to see if [facility] will give to him  COVID-19 Vaccine: up to date Pneumonia Vaccine: declined   Patient does not check his BP at home. He does eat a low salt diet and exercise.   Depression screen PHQ 2/9 07/14/2020 03/09/2020 11/09/2019  Decreased Interest 2 0 0  Down, Depressed, Hopeless 0 0 1  PHQ - 2 Score 2 0 1   Altered sleeping 0 0 -  Tired, decreased energy 2 1 -  Change in appetite 0 0 -  Feeling bad or failure about yourself  0 0 -  Trouble concentrating 0 0 -  Moving slowly or fidgety/restless 0 0 -  Suicidal thoughts 0 0 -  PHQ-9 Score 4 1 -  Difficult doing work/chores - Not difficult at all -     Review of Systems  Constitutional: Negative for chills, fever, malaise/fatigue and weight loss.  HENT: Negative for congestion, ear discharge, ear pain, nosebleeds, sinus pain, sore throat and tinnitus.   Eyes: Negative for blurred vision, double vision, pain, discharge and redness.  Respiratory: Negative for cough, shortness of breath and wheezing.   Cardiovascular: Negative for chest pain, palpitations and leg swelling.  Gastrointestinal: Negative for abdominal pain, constipation, diarrhea, heartburn, nausea and vomiting.  Genitourinary: Negative for dysuria, frequency and urgency.  Musculoskeletal: Negative for myalgias.  Skin: Negative for rash.  Neurological: Negative for dizziness, seizures, weakness and headaches.  Psychiatric/Behavioral: Negative for depression, substance abuse and suicidal ideas. The patient is not nervous/anxious.      Current Outpatient Medications:  .  aspirin EC 81 MG tablet, Take 81 mg by mouth every evening., Disp: , Rfl:  .  cholecalciferol (VITAMIN D) 1000 UNITS tablet, Take 2,000 Units by mouth every evening. Pt takes two tablets daily, Disp: , Rfl:  .  Krill Oil 300 MG CAPS, Take 1 capsule by mouth every evening., Disp: , Rfl:  .  loratadine (CLARITIN) 10 MG tablet, Take 10 mg by mouth every   evening. , Disp: , Rfl:  .  losartan (COZAAR) 50 MG tablet, Take 1.5 tablets (75 mg total) by mouth daily., Disp: 135 tablet, Rfl: 1 .  pantoprazole (PROTONIX) 40 MG tablet, Take 1 tablet (40 mg total) by mouth every evening., Disp: 90 tablet, Rfl: 4 .  rosuvastatin (CRESTOR) 20 MG tablet, Take 1 tablet (20 mg total) by mouth daily., Disp: 90 tablet, Rfl: 0 .   venlafaxine XR (EFFEXOR-XR) 75 MG 24 hr capsule, Take 75 mg by mouth daily with breakfast., Disp: , Rfl:   No Known Allergies  Past Medical History:  Diagnosis Date  . Cervical spondylolysis   . Essential hypertension   . GERD (gastroesophageal reflux disease)   . History of kidney stones   . History of migraine   . Hyperlipidemia   . PONV (postoperative nausea and vomiting)   . Type 2 diabetes mellitus ([place])     Past Surgical History:  Procedure Laterality Date  . Bilateral inguinal hernia repair    . COLONOSCOPY  01/23/2012   Procedure: COLONOSCOPY;  Surgeon: Daneil Dolin, MD;  [facility];  Service: Endoscopy;  Laterality: N/A;  9:30 AM  . COLONOSCOPY N/A 10/11/2015   Procedure: COLONOSCOPY;  Surgeon: Daneil Dolin, MD;  [facility]: [facility];  Service: Endoscopy;  Laterality: N/A;  830  . COLONOSCOPY N/A 10/14/2019   Procedure: COLONOSCOPY;  Surgeon: Daneil Dolin, MD;  [facility]: [facility];  Service: Endoscopy;  Laterality: N/A;  1:45  . POLYPECTOMY  10/14/2019   Procedure: POLYPECTOMY;  Surgeon: Daneil Dolin, MD;  Location: [facility];  Service: Endoscopy;;  ascending colon, descending colon    Family History  Problem Relation Age of Onset  . Heart attack Mother   . Heart attack Father   . Heart attack Brother   . Colon cancer Neg Hx     Social History   Socioeconomic History  . Marital status: Married    Spouse name: Not on file  . Number of children: Not on file  . Years of education: Not on file  . Highest education level: Not on file  Occupational History  . Not on file  Tobacco Use  . Smoking status: Current Every Day Smoker    Packs/day: 1.00    Years: 30.00    Pack years: 30.00    Types: Cigarettes    Start date: 10/30/1973  . Smokeless tobacco: Never Used  Vaping Use  . Vaping Use: Never used  Substance and Sexual Activity  . Alcohol use: Yes    Alcohol/week: 0.0 standard drinks    Comment: One drink every 6 months.  .  Drug use: No  . Sexual activity: Not on file  Other Topics Concern  . Not on file  Social History Narrative  . Not on file   Social Determinants of Health   Financial Resource Strain:   . Difficulty of Paying Living Expenses: Not on file  Food Insecurity:   . Worried About Charity fundraiser in the Last Year: Not on file  . Ran Out of Food in the Last Year: Not on file  Transportation Needs:   . Lack of Transportation (Medical): Not on file  . Lack of Transportation (Non-Medical): Not on file  Physical Activity:   . Days of Exercise per Week: Not on file  . Minutes of Exercise per Session: Not on file  Stress:   . Feeling of Stress : Not on file  Social Connections:   .  Frequency of Communication with Friends and Family: Not on file  . Frequency of Social Gatherings with Friends and Family: Not on file  . Attends Religious Services: Not on file  . Active Member of Clubs or Organizations: Not on file  . Attends Club or Organization Meetings: Not on file  . Marital Status: Not on file  Intimate Partner Violence:   . Fear of Current or Ex-Partner: Not on file  . Emotionally Abused: Not on file  . Physically Abused: Not on file  . Sexually Abused: Not on file      Objective:    BP 132/86 Comment: manual  Pulse (!) 109   Temp 97.6 F (36.4 C) (Temporal)   Ht 6' 1" (1.854 m)   Wt 164 lb (74.4 kg)   SpO2 99%   BMI 21.64 kg/m   Wt Readings from Last 3 Encounters:  07/14/20 164 lb (74.4 kg)  03/09/20 163 lb 6.4 oz (74.1 kg)  11/09/19 163 lb (73.9 kg)    Physical Exam Vitals reviewed.  Constitutional:      General: He is not in acute distress.    Appearance: Normal appearance. He is normal weight. He is not ill-appearing, toxic-appearing or diaphoretic.  HENT:     Head: Normocephalic and atraumatic.     Right Ear: Tympanic membrane, ear canal and external ear normal. There is no impacted cerumen.     Left Ear: Tympanic membrane, ear canal and external ear normal.  There is no impacted cerumen.     Nose: Nose normal. No congestion or rhinorrhea.     Mouth/Throat:     Mouth: Mucous membranes are moist.     Pharynx: Oropharynx is clear. No oropharyngeal exudate or posterior oropharyngeal erythema.  Eyes:     General: No scleral icterus.       Right eye: No discharge.        Left eye: No discharge.     Conjunctiva/sclera: Conjunctivae normal.     Pupils: Pupils are equal, round, and reactive to light.  Cardiovascular:     Rate and Rhythm: Normal rate and regular rhythm.     Heart sounds: Normal heart sounds. No murmur heard.  No friction rub. No gallop.   Pulmonary:     Effort: Pulmonary effort is normal. No respiratory distress.     Breath sounds: Normal breath sounds. No stridor. No wheezing, rhonchi or rales.  Abdominal:     General: Abdomen is flat. Bowel sounds are normal. There is no distension.     Palpations: Abdomen is soft. There is no mass.     Tenderness: There is no abdominal tenderness. There is no guarding or rebound.     Hernia: No hernia is present.  Musculoskeletal:        General: Normal range of motion.     Cervical back: Normal range of motion and neck supple. No rigidity. No muscular tenderness.     Right lower leg: No edema.     Left lower leg: No edema.  Lymphadenopathy:     Cervical: No cervical adenopathy.  Skin:    General: Skin is warm and dry.     Capillary Refill: Capillary refill takes less than 2 seconds.  Neurological:     General: No focal deficit present.     Mental Status: He is alert and oriented to person, place, and time. Mental status is at baseline.  Psychiatric:        Mood and Affect: Mood normal.          Behavior: Behavior normal.        Thought Content: Thought content normal.        Judgment: Judgment normal.     Lab Results  Component Value Date   TSH 2.900 11/09/2019   Lab Results  Component Value Date   WBC 8.9 11/09/2019   HGB 15.3 11/09/2019   HCT 45.1 11/09/2019   MCV 89  11/09/2019   PLT 226 11/09/2019   Lab Results  Component Value Date   NA 141 03/09/2020   K 4.8 03/09/2020   CO2 25 03/09/2020   GLUCOSE 112 (H) 03/09/2020   BUN 13 03/09/2020   CREATININE 1.02 03/09/2020   BILITOT 0.2 03/09/2020   ALKPHOS 92 03/09/2020   AST 16 03/09/2020   ALT 19 03/09/2020   PROT 6.4 03/09/2020   ALBUMIN 4.4 03/09/2020   CALCIUM 9.8 03/09/2020   ANIONGAP 6 10/12/2016   Lab Results  Component Value Date   CHOL 91 (L) 11/09/2019   Lab Results  Component Value Date   HDL 34 (L) 11/09/2019   Lab Results  Component Value Date   LDLCALC 38 11/09/2019   Lab Results  Component Value Date   TRIG 98 11/09/2019   Lab Results  Component Value Date   CHOLHDL 2.7 11/09/2019   Lab Results  Component Value Date   HGBA1C 5.9 03/09/2020

## 2020-07-14 NOTE — Patient Instructions (Signed)
Preventive Care 41-63 Years Old, Male Preventive care refers to lifestyle choices and visits with your health care provider that can promote health and wellness. This includes:  A yearly physical exam. This is also called an annual well check.  Regular dental and eye exams.  Immunizations.  Screening for certain conditions.  Healthy lifestyle choices, such as eating a healthy diet, getting regular exercise, not using drugs or products that contain nicotine and tobacco, and limiting alcohol use. What can I expect for my preventive care visit? Physical exam Your health care provider will check:  Height and weight. These may be used to calculate body mass index (BMI), which is a measurement that tells if you are at a healthy weight.  Heart rate and blood pressure.  Your skin for abnormal spots. Counseling Your health care provider may ask you questions about:  Alcohol, tobacco, and drug use.  Emotional well-being.  Home and relationship well-being.  Sexual activity.  Eating habits.  Work and work Statistician. What immunizations do I need?  Influenza (flu) vaccine  This is recommended every year. Tetanus, diphtheria, and pertussis (Tdap) vaccine  You may need a Td booster every 10 years. Varicella (chickenpox) vaccine  You may need this vaccine if you have not already been vaccinated. Zoster (shingles) vaccine  You may need this after age 63. Measles, mumps, and rubella (MMR) vaccine  You may need at least one dose of MMR if you were born in 1957 or later. You may also need a second dose. Pneumococcal conjugate (PCV13) vaccine  You may need this if you have certain conditions and were not previously vaccinated. Pneumococcal polysaccharide (PPSV23) vaccine  You may need one or two doses if you smoke cigarettes or if you have certain conditions. Meningococcal conjugate (MenACWY) vaccine  You may need this if you have certain conditions. Hepatitis A  vaccine  You may need this if you have certain conditions or if you travel or work in places where you may be exposed to hepatitis A. Hepatitis B vaccine  You may need this if you have certain conditions or if you travel or work in places where you may be exposed to hepatitis B. Haemophilus influenzae type b (Hib) vaccine  You may need this if you have certain risk factors. Human papillomavirus (HPV) vaccine  If recommended by your health care provider, you may need three doses over 6 months. You may receive vaccines as individual doses or as more than one vaccine together in one shot (combination vaccines). Talk with your health care provider about the risks and benefits of combination vaccines. What tests do I need? Blood tests  Lipid and cholesterol levels. These may be checked every 5 years, or more frequently if you are over 60 years old.  Hepatitis C test.  Hepatitis B test. Screening  Lung cancer screening. You may have this screening every year starting at age 63 if you have a 30-pack-year history of smoking and currently smoke or have quit within the past 15 years.  Prostate cancer screening. Recommendations will vary depending on your family history and other risks.  Colorectal cancer screening. All adults should have this screening starting at age 63 and continuing until age 2. Your health care provider may recommend screening at age 63 if you are at increased risk. You will have tests every 1-10 years, depending on your results and the type of screening test.  Diabetes screening. This is done by checking your blood sugar (glucose) after you have not eaten  for a while (fasting). You may have this done every 1-3 years.  Sexually transmitted disease (STD) testing. Follow these instructions at home: Eating and drinking  Eat a diet that includes fresh fruits and vegetables, whole grains, lean protein, and low-fat dairy products.  Take vitamin and mineral supplements as  recommended by your health care provider.  Do not drink alcohol if your health care provider tells you not to drink.  If you drink alcohol: ? Limit how much you have to 0-2 drinks a day. ? Be aware of how much alcohol is in your drink. In the [place] one drink equals one 12 oz bottle of beer (355 mL), one 5 oz glass of wine (148 mL), or one 1 oz glass of hard liquor (44 mL). Lifestyle  Take daily care of your teeth and gums.  Stay active. Exercise for at least 30 minutes on 5 or more days each week.  Do not use any products that contain nicotine or tobacco, such as cigarettes, e-cigarettes, and chewing tobacco. If you need help quitting, ask your health care provider.  If you are sexually active, practice safe sex. Use a condom or other form of protection to prevent STIs (sexually transmitted infections).  Talk with your health care provider about taking a low-dose aspirin every day starting at age 63 What's next?  Go to your health care provider once a year for a well check visit.  Ask your health care provider how often you should have your eyes and teeth checked.  Stay up to date on all vaccines. This information is not intended to replace advice given to you by your health care provider. Make sure you discuss any questions you have with your health care provider. Document Revised: 63/09/2018 Document Reviewed: 63/09/2018 Elsevier Patient Education  2020 Elsevier Inc.  

## 2020-08-30 ENCOUNTER — Other Ambulatory Visit: Payer: Self-pay | Admitting: Family

## 2020-08-30 DIAGNOSIS — I152 Hypertension secondary to endocrine disorders: Secondary | ICD-10-CM

## 2020-08-30 MED FILL — VENLAFAXINE HCL ER 75 MG CA: 75 | 90 days supply | Qty: 90 | Fill #2

## 2020-08-30 MED FILL — PANTOPRAZOLE SOD DR 40 MG T: 40 | 90 days supply | Qty: 90 | Fill #2

## 2020-08-30 MED FILL — LOSARTAN POTASSIUM 50 MG TA: 50 | 90 days supply | Qty: 135 | Fill #0

## 2020-09-19 MED FILL — ROSUVASTATIN CALCIUM 20 MG: 20 | 90 days supply | Qty: 90 | Fill #1

## 2020-10-08 DIAGNOSIS — C801 Malignant (primary) neoplasm, unspecified: Secondary | ICD-10-CM

## 2020-10-08 HISTORY — DX: Malignant (primary) neoplasm, unspecified: C80.1

## 2020-11-09 DIAGNOSIS — H524 Presbyopia: Secondary | ICD-10-CM | POA: Diagnosis not present

## 2020-11-09 DIAGNOSIS — H02834 Dermatochalasis of left upper eyelid: Secondary | ICD-10-CM | POA: Diagnosis not present

## 2020-11-09 DIAGNOSIS — H02831 Dermatochalasis of right upper eyelid: Secondary | ICD-10-CM | POA: Diagnosis not present

## 2020-11-09 DIAGNOSIS — H40023 Open angle with borderline findings, high risk, bilateral: Secondary | ICD-10-CM | POA: Diagnosis not present

## 2020-11-09 DIAGNOSIS — H52203 Unspecified astigmatism, bilateral: Secondary | ICD-10-CM | POA: Diagnosis not present

## 2020-11-09 DIAGNOSIS — H2513 Age-related nuclear cataract, bilateral: Secondary | ICD-10-CM | POA: Diagnosis not present

## 2020-11-09 LAB — HM DIABETES EYE EXAM

## 2020-11-22 MED FILL — LOSARTAN POTASSIUM 50 MG TA: 50 | 30 days supply | Qty: 45 | Fill #1

## 2020-11-28 ENCOUNTER — Other Ambulatory Visit: Payer: Self-pay | Admitting: Family

## 2020-11-28 MED FILL — PANTOPRAZOLE SOD DR 40 MG T: 40 | 90 days supply | Qty: 90 | Fill #3

## 2020-11-28 MED FILL — VENLAFAXINE HCL ER 75 MG CA: 75 | 90 days supply | Qty: 90 | Fill #0

## 2020-11-30 ENCOUNTER — Other Ambulatory Visit: Payer: Self-pay | Admitting: Family

## 2020-12-03 ENCOUNTER — Other Ambulatory Visit: Payer: Self-pay | Admitting: Family

## 2020-12-06 ENCOUNTER — Other Ambulatory Visit: Payer: Self-pay | Admitting: Family

## 2020-12-18 MED FILL — ROSUVASTATIN CALCIUM 20 MG: 20 | 90 days supply | Qty: 90 | Fill #2

## 2020-12-27 ENCOUNTER — Other Ambulatory Visit (HOSPITAL_BASED_OUTPATIENT_CLINIC_OR_DEPARTMENT_OTHER): Payer: Self-pay

## 2021-01-09 ENCOUNTER — Other Ambulatory Visit (HOSPITAL_COMMUNITY): Payer: Self-pay

## 2021-01-10 ENCOUNTER — Encounter: Payer: Self-pay | Admitting: Family Medicine

## 2021-01-10 ENCOUNTER — Other Ambulatory Visit: Payer: Self-pay

## 2021-01-10 ENCOUNTER — Ambulatory Visit: Payer: 59 | Admitting: Family Medicine

## 2021-01-10 ENCOUNTER — Other Ambulatory Visit (HOSPITAL_COMMUNITY): Payer: Self-pay

## 2021-01-10 VITALS — BP 123/82 | HR 68 | Temp 97.7°F | Ht 73.0 in | Wt 167.4 lb

## 2021-01-10 DIAGNOSIS — E782 Mixed hyperlipidemia: Secondary | ICD-10-CM

## 2021-01-10 DIAGNOSIS — E559 Vitamin D deficiency, unspecified: Secondary | ICD-10-CM | POA: Diagnosis not present

## 2021-01-10 DIAGNOSIS — R7303 Prediabetes: Secondary | ICD-10-CM

## 2021-01-10 DIAGNOSIS — F320 Major depressive disorder, single episode, mild: Secondary | ICD-10-CM | POA: Diagnosis not present

## 2021-01-10 DIAGNOSIS — I10 Essential (primary) hypertension: Secondary | ICD-10-CM

## 2021-01-10 LAB — BAYER DCA HB A1C WAIVED: HB A1C (BAYER DCA - WAIVED): 6.2 % (ref <7.0)

## 2021-01-10 MED ORDER — ROSUVASTATIN CALCIUM 20 MG PO TABS
20.0000 mg | ORAL_TABLET | Freq: Every day | ORAL | 1 refills | Status: DC
  Filled 2021-01-10 – 2021-04-19 (×2): qty 90, 90d supply, fill #0

## 2021-01-10 MED ORDER — VENLAFAXINE HCL ER 75 MG PO CP24
75.0000 mg | ORAL_CAPSULE | Freq: Every day | ORAL | 1 refills | Status: DC
  Filled 2021-01-10 – 2021-04-19 (×2): qty 90, 90d supply, fill #0

## 2021-01-10 MED ORDER — LOSARTAN POTASSIUM 50 MG PO TABS
ORAL_TABLET | Freq: Every day | ORAL | 1 refills | Status: DC
  Filled 2021-01-10: qty 135, 90d supply, fill #0
  Filled 2021-05-05: qty 135, 90d supply, fill #1

## 2021-01-10 NOTE — Patient Instructions (Signed)
Diabetes Mellitus and Nutrition, Adult When you have diabetes, or diabetes mellitus, it is very important to have healthy eating habits because your blood sugar (glucose) levels are greatly affected by what you eat and drink. Eating healthy foods in the right amounts, at about the same times every day, can help you:  Control your blood glucose.  Lower your risk of heart disease.  Improve your blood pressure.  Reach or maintain a healthy weight. What can affect my meal plan? Every person with diabetes is different, and each person has different needs for a meal plan. Your health care provider may recommend that you work with a dietitian to make a meal plan that is best for you. Your meal plan may vary depending on factors such as:  The calories you need.  The medicines you take.  Your weight.  Your blood glucose, blood pressure, and cholesterol levels.  Your activity level.  Other health conditions you have, such as heart or kidney disease. How do carbohydrates affect me? Carbohydrates, also called carbs, affect your blood glucose level more than any other type of food. Eating carbs naturally raises the amount of glucose in your blood. Carb counting is a method for keeping track of how many carbs you eat. Counting carbs is important to keep your blood glucose at a healthy level, especially if you use insulin or take certain oral diabetes medicines. It is important to know how many carbs you can safely have in each meal. This is different for every person. Your dietitian can help you calculate how many carbs you should have at each meal and for each snack. How does alcohol affect me? Alcohol can cause a sudden decrease in blood glucose (hypoglycemia), especially if you use insulin or take certain oral diabetes medicines. Hypoglycemia can be a life-threatening condition. Symptoms of hypoglycemia, such as sleepiness, dizziness, and confusion, are similar to symptoms of having too much  alcohol.  Do not drink alcohol if: ? Your health care provider tells you not to drink. ? You are pregnant, may be pregnant, or are planning to become pregnant.  If you drink alcohol: ? Do not drink on an empty stomach. ? Limit how much you use to:  0-1 drink a day for women.  0-2 drinks a day for men. ? Be aware of how much alcohol is in your drink. In the [place] one drink equals one 12 oz bottle of beer (355 mL), one 5 oz glass of wine (148 mL), or one 1 oz glass of hard liquor (44 mL). ? Keep yourself hydrated with water, diet soda, or unsweetened iced tea.  Keep in mind that regular soda, juice, and other mixers may contain a lot of sugar and must be counted as carbs. What are tips for following this plan? Reading food labels  Start by checking the serving size on the "Nutrition Facts" label of packaged foods and drinks. The amount of calories, carbs, fats, and other nutrients listed on the label is based on one serving of the item. Many items contain more than one serving per package.  Check the total grams (g) of carbs in one serving. You can calculate the number of servings of carbs in one serving by dividing the total carbs by 15. For example, if a food has 30 g of total carbs per serving, it would be equal to 2 servings of carbs.  Check the number of grams (g) of saturated fats and trans fats in one serving. Choose foods that have   a low amount or none of these fats.  Check the number of milligrams (mg) of salt (sodium) in one serving. Most people should limit total sodium intake to less than 2,300 mg per day.  Always check the nutrition information of foods labeled as "low-fat" or "nonfat." These foods may be higher in added sugar or refined carbs and should be avoided.  Talk to your dietitian to identify your daily goals for nutrients listed on the label. Shopping  Avoid buying canned, pre-made, or processed foods. These foods tend to be high in fat, sodium, and added  sugar.  Shop around the outside edge of the grocery store. This is where you will most often find fresh fruits and vegetables, bulk grains, fresh meats, and fresh dairy. Cooking  Use low-heat cooking methods, such as baking, instead of high-heat cooking methods like deep frying.  Cook using healthy oils, such as olive, canola, or sunflower oil.  Avoid cooking with butter, cream, or high-fat meats. Meal planning  Eat meals and snacks regularly, preferably at the same times every day. Avoid going long periods of time without eating.  Eat foods that are high in fiber, such as fresh fruits, vegetables, beans, and whole grains. Talk with your dietitian about how many servings of carbs you can eat at each meal.  Eat 4-6 oz (112-168 g) of lean protein each day, such as lean meat, chicken, fish, eggs, or tofu. One ounce (oz) of lean protein is equal to: ? 1 oz (28 g) of meat, chicken, or fish. ? 1 egg. ?  cup (62 g) of tofu.  Eat some foods each day that contain healthy fats, such as avocado, nuts, seeds, and fish.   What foods should I eat? Fruits Berries. Apples. Oranges. Peaches. Apricots. Plums. Grapes. Mango. Papaya. Pomegranate. Kiwi. Cherries. Vegetables Lettuce. Spinach. Leafy greens, including kale, chard, collard greens, and mustard greens. Beets. Cauliflower. Cabbage. Broccoli. Carrots. Green beans. Tomatoes. Peppers. Onions. Cucumbers. Brussels sprouts. Grains Whole grains, such as whole-wheat or whole-grain bread, crackers, tortillas, cereal, and pasta. Unsweetened oatmeal. Quinoa. Brown or wild rice. Meats and other proteins Seafood. Poultry without skin. Lean cuts of poultry and beef. Tofu. Nuts. Seeds. Dairy Low-fat or fat-free dairy products such as milk, yogurt, and cheese. The items listed above may not be a complete list of foods and beverages you can eat. Contact a dietitian for more information. What foods should I avoid? Fruits Fruits canned with  syrup. Vegetables Canned vegetables. Frozen vegetables with butter or cream sauce. Grains Refined white flour and flour products such as bread, pasta, snack foods, and cereals. Avoid all processed foods. Meats and other proteins Fatty cuts of meat. Poultry with skin. Breaded or fried meats. Processed meat. Avoid saturated fats. Dairy Full-fat yogurt, cheese, or milk. Beverages Sweetened drinks, such as soda or iced tea. The items listed above may not be a complete list of foods and beverages you should avoid. Contact a dietitian for more information. Questions to ask a health care provider  Do I need to meet with a diabetes educator?  Do I need to meet with a dietitian?  What number can I call if I have questions?  When are the best times to check my blood glucose? Where to find more information:  American Diabetes Association: diabetes.org  Academy of Nutrition and Dietetics: www.eatright.org  National Institute of Diabetes and Digestive and Kidney Diseases: www.niddk.nih.gov  Association of Diabetes Care and Education Specialists: www.diabeteseducator.org Summary  It is important to have healthy eating   habits because your blood sugar (glucose) levels are greatly affected by what you eat and drink.  A healthy meal plan will help you control your blood glucose and maintain a healthy lifestyle.  Your health care provider may recommend that you work with a dietitian to make a meal plan that is best for you.  Keep in mind that carbohydrates (carbs) and alcohol have immediate effects on your blood glucose levels. It is important to count carbs and to use alcohol carefully. This information is not intended to replace advice given to you by your health care provider. Make sure you discuss any questions you have with your health care provider. Document Revised: 09/01/2019 Document Reviewed: 09/01/2019 Elsevier Patient Education  2021 Elsevier Inc.  

## 2021-01-10 NOTE — Progress Notes (Signed)
Assessment & Plan:  1. Prediabetes A1c increased from his last visit to 6.2.  Encouraged a healthier diet.  Continue exercise. - Bayer DCA Hb A1c Waived  2. Essential hypertension Well controlled on current regimen.  - Lipid panel - CBC with Differential/Platelet - CMP14+EGFR - losartan (COZAAR) 50 MG tablet; TAKE 1 AND 1/2 TABLETS BY MOUTH DAILY  Dispense: 135 tablet; Refill: 1  3. Mixed hyperlipidemia Well controlled on current regimen.  - Lipid panel - CMP14+EGFR - rosuvastatin (CRESTOR) 20 MG tablet; Take 1 tablet (20 mg total) by mouth daily.  Dispense: 90 tablet; Refill: 1  4. Current mild episode of major depressive disorder without prior episode ([place]) Well controlled on current regimen.  - CMP14+EGFR - venlafaxine XR (EFFEXOR-XR) 75 MG 24 hr capsule; Take 1 capsule (75 mg total) by mouth daily with breakfast.  Dispense: 90 capsule; Refill: 1  5. Vitamin D deficiency Well controlled on current regimen.  - Vitamin D level   Return in about 6 months (around 07/12/2021) for annual physical.  Hendricks Limes, MSN, APRN, FNP-C [facility]  Subjective:    Patient ID: Bradley Goodman, male    DOB: [birth year], 65 y.o.   MRN: [id number]  Patient Care Team: [name], FNP as PCP - General (Family Medicine) Gala Romney Cristopher Estimable, MD as Consulting Physician (Gastroenterology) Celestia Khat, [place] (Optometry)   Chief Complaint:  Chief Complaint  Patient presents with  . Diabetes  . Hypertension    Check up of chronic medical conditions     HPI: Bradley Goodman is a 65 y.o. male presenting on 01/10/2021 for Diabetes and Hypertension (Check up of chronic medical conditions )  Prediabetes: Patient is not checking his blood glucose at home.  He is not eating very healthy recently.  He does play golf at least once a week and is lifting weights 3-4 times per week.  New complaints: None  Social history:  Relevant past medical, surgical, family and  social history reviewed and updated as indicated. Interim medical history since our last visit reviewed.  Allergies and medications reviewed and updated.  DATA REVIEWED: CHART IN EPIC  ROS: Negative unless specifically indicated above in HPI.    Current Outpatient Medications:  .  aspirin EC 81 MG tablet, Take 81 mg by mouth every evening., Disp: , Rfl:  .  cholecalciferol (VITAMIN D) 1000 UNITS tablet, Take 2,000 Units by mouth every evening. Pt takes two tablets daily, Disp: , Rfl:  .  Krill Oil 300 MG CAPS, Take 1 capsule by mouth every evening., Disp: , Rfl:  .  loratadine (CLARITIN) 10 MG tablet, Take 10 mg by mouth every evening. , Disp: , Rfl:  .  losartan (COZAAR) 50 MG tablet, TAKE 1 AND 1/2 TABLETS BY MOUTH DAILY, Disp: 135 tablet, Rfl: 1 .  pantoprazole (PROTONIX) 40 MG tablet, TAKE 1 TABLET BY MOUTH EVERY EVENING., Disp: 90 tablet, Rfl: 4 .  rosuvastatin (CRESTOR) 20 MG tablet, TAKE 1 TABLET BY MOUTH DAILY, Disp: 90 tablet, Rfl: 3 .  venlafaxine XR (EFFEXOR-XR) 75 MG 24 hr capsule, TAKE 1 CAPSULE BY MOUTH DAILY WITH BREAKFAST., Disp: 90 capsule, Rfl: 0   No Known Allergies Past Medical History:  Diagnosis Date  . Cervical spondylolysis   . Essential hypertension   . GERD (gastroesophageal reflux disease)   . History of kidney stones   . History of migraine   . Hyperlipidemia   . PONV (postoperative nausea and vomiting)   . Type  2 diabetes mellitus ([place])     Past Surgical History:  Procedure Laterality Date  . Bilateral inguinal hernia repair    . COLONOSCOPY  01/23/2012   Procedure: COLONOSCOPY;  Surgeon: Daneil Dolin, MD;  [facility];  Service: Endoscopy;  Laterality: N/A;  9:30 AM  . COLONOSCOPY N/A 10/11/2015   Procedure: COLONOSCOPY;  Surgeon: Daneil Dolin, MD;  [facility]: [facility];  Service: Endoscopy;  Laterality: N/A;  830  . COLONOSCOPY N/A 10/14/2019   Procedure: COLONOSCOPY;  Surgeon: Daneil Dolin, MD;  Location: [facility];  Service:  Endoscopy;  Laterality: N/A;  1:45  . POLYPECTOMY  10/14/2019   Procedure: POLYPECTOMY;  Surgeon: Daneil Dolin, MD;  Location: [facility];  Service: Endoscopy;;  ascending colon, descending colon    Social History   Socioeconomic History  . Marital status: Married    Spouse name: Not on file  . Number of children: Not on file  . Years of education: Not on file  . Highest education level: Not on file  Occupational History  . Not on file  Tobacco Use  . Smoking status: Current Every Day Smoker    Packs/day: 1.00    Years: 30.00    Pack years: 30.00    Types: Cigarettes    Start date: 10/30/1973  . Smokeless tobacco: Never Used  Vaping Use  . Vaping Use: Never used  Substance and Sexual Activity  . Alcohol use: Yes    Alcohol/week: 0.0 standard drinks    Comment: One drink every 6 months.  . Drug use: No  . Sexual activity: Not on file  Other Topics Concern  . Not on file  Social History Narrative  . Not on file   Social Determinants of Health   Financial Resource Strain: Not on file  Food Insecurity: Not on file  Transportation Needs: Not on file  Physical Activity: Not on file  Stress: Not on file  Social Connections: Not on file  Intimate Partner Violence: Not on file        Objective:    BP 123/82   Pulse 68   Temp 97.7 F (36.5 C) (Temporal)   Ht _0  (1.854 m)   Wt 167 lb 6.4 oz (75.9 kg)   SpO2 95%   BMI 22.09 kg/m   Wt Readings from Last 3 Encounters:  01/10/21 167 lb 6.4 oz (75.9 kg)  07/14/20 164 lb (74.4 kg)  03/09/20 163 lb 6.4 oz (74.1 kg)    Physical Exam Vitals reviewed.  Constitutional:      General: He is not in acute distress.    Appearance: Normal appearance. He is normal weight. He is not ill-appearing, toxic-appearing or diaphoretic.  HENT:     Head: Normocephalic and atraumatic.  Eyes:     General: No scleral icterus.       Right eye: No discharge.        Left eye: No discharge.     Conjunctiva/sclera: Conjunctivae  normal.  Cardiovascular:     Rate and Rhythm: Normal rate and regular rhythm.     Heart sounds: Normal heart sounds. No murmur heard. No friction rub. No gallop.   Pulmonary:     Effort: Pulmonary effort is normal. No respiratory distress.     Breath sounds: Normal breath sounds. No stridor. No wheezing, rhonchi or rales.  Musculoskeletal:        General: Normal range of motion.     Cervical back: Normal  range of motion.  Skin:    General: Skin is warm and dry.  Neurological:     Mental Status: He is alert and oriented to person, place, and time. Mental status is at baseline.  Psychiatric:        Mood and Affect: Mood normal.        Behavior: Behavior normal.        Thought Content: Thought content normal.        Judgment: Judgment normal.     Lab Results  Component Value Date   TSH 2.900 11/09/2019   Lab Results  Component Value Date   WBC 9.9 07/14/2020   HGB 14.8 07/14/2020   HCT 46.1 07/14/2020   MCV 89 07/14/2020   PLT 235 07/14/2020   Lab Results  Component Value Date   NA 141 07/14/2020   K 5.4 (H) 07/14/2020   CO2 25 07/14/2020   GLUCOSE 109 (H) 07/14/2020   BUN 18 07/14/2020   CREATININE 1.10 07/14/2020   BILITOT <0.2 07/14/2020   ALKPHOS 90 07/14/2020   AST 16 07/14/2020   ALT 18 07/14/2020   PROT 6.6 07/14/2020   ALBUMIN 4.1 07/14/2020   CALCIUM 9.6 07/14/2020   ANIONGAP 6 10/12/2016   Lab Results  Component Value Date   CHOL 90 (L) 07/14/2020   Lab Results  Component Value Date   HDL 35 (L) 07/14/2020   Lab Results  Component Value Date   LDLCALC 36 07/14/2020   Lab Results  Component Value Date   TRIG 96 07/14/2020   Lab Results  Component Value Date   CHOLHDL 2.6 07/14/2020   Lab Results  Component Value Date   HGBA1C 5.9 07/14/2020

## 2021-01-11 LAB — CMP14+EGFR
ALT: 25 IU/L (ref 0–44)
AST: 17 IU/L (ref 0–40)
Albumin/Globulin Ratio: 2 (ref 1.2–2.2)
Albumin: 4.2 g/dL (ref 3.8–4.8)
Alkaline Phosphatase: 87 IU/L (ref 44–121)
BUN/Creatinine Ratio: 14 (ref 10–24)
BUN: 15 mg/dL (ref 8–27)
Bilirubin Total: 0.2 mg/dL (ref 0.0–1.2)
CO2: 23 mmol/L (ref 20–29)
Calcium: 9.6 mg/dL (ref 8.6–10.2)
Chloride: 106 mmol/L (ref 96–106)
Creatinine, Ser: 1.09 mg/dL (ref 0.76–1.27)
Globulin, Total: 2.1 g/dL (ref 1.5–4.5)
Glucose: 109 mg/dL — ABNORMAL HIGH (ref 65–99)
Potassium: 4.6 mmol/L (ref 3.5–5.2)
Sodium: 144 mmol/L (ref 134–144)
Total Protein: 6.3 g/dL (ref 6.0–8.5)
eGFR: 76 mL/min/{1.73_m2} (ref >59)

## 2021-01-11 LAB — CBC WITH DIFFERENTIAL/PLATELET
Basophils Absolute: 0.1 10*3/uL (ref 0.0–0.2)
Basos: 1 %
EOS (ABSOLUTE): 0.1 10*3/uL (ref 0.0–0.4)
Eos: 1 %
Hematocrit: 44.3 % (ref 37.5–51.0)
Hemoglobin: 14.8 g/dL (ref 13.0–17.7)
Immature Grans (Abs): 0.1 10*3/uL (ref 0.0–0.1)
Immature Granulocytes: 1 %
Lymphocytes Absolute: 1.7 10*3/uL (ref 0.7–3.1)
Lymphs: 18 %
MCH: 28.7 pg (ref 26.6–33.0)
MCHC: 33.4 g/dL (ref 31.5–35.7)
MCV: 86 fL (ref 79–97)
Monocytes Absolute: 0.6 10*3/uL (ref 0.1–0.9)
Monocytes: 7 %
Neutrophils Absolute: 7.2 10*3/uL — ABNORMAL HIGH (ref 1.4–7.0)
Neutrophils: 72 %
Platelets: 238 10*3/uL (ref 150–450)
RBC: 5.15 x10E6/uL (ref 4.14–5.80)
RDW: 13.7 % (ref 11.6–15.4)
WBC: 9.8 10*3/uL (ref 3.4–10.8)

## 2021-01-11 LAB — LIPID PANEL
Chol/HDL Ratio: 2.9 ratio (ref 0.0–5.0)
Cholesterol, Total: 95 mg/dL — ABNORMAL LOW (ref 100–199)
HDL: 33 mg/dL — ABNORMAL LOW (ref >39)
LDL Chol Calc (NIH): 33 mg/dL (ref 0–99)
Triglycerides: 180 mg/dL — ABNORMAL HIGH (ref 0–149)
VLDL Cholesterol Cal: 29 mg/dL (ref 5–40)

## 2021-01-12 LAB — VITAMIN D 25 HYDROXY (VIT D DEFICIENCY, FRACTURES): Vit D, 25-Hydroxy: 103 ng/mL — ABNORMAL HIGH (ref 30.0–100.0)

## 2021-01-12 LAB — SPECIMEN STATUS REPORT

## 2021-01-16 ENCOUNTER — Other Ambulatory Visit (HOSPITAL_COMMUNITY): Payer: Self-pay

## 2021-03-01 ENCOUNTER — Telehealth: Payer: 59 | Admitting: Physician Assistant

## 2021-03-01 DIAGNOSIS — K140 Glossitis: Secondary | ICD-10-CM

## 2021-03-01 MED ORDER — LIDOCAINE VISCOUS HCL 2 % MT SOLN: 15.0000 mL | Freq: Three times a day (TID) | OROMUCOSAL | 0 refills | Status: DC | PRN

## 2021-03-01 NOTE — Progress Notes (Signed)
E-Visit for Mouth Ulcers  We are sorry that you are not feeling well.  Here is how we plan to help!  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 It can be mild to moderate in terms of pain. This is a condition that resolves on its own, typically with 4-5 days but can last up to a week or so. Keep well-hydrated. Avoid spicy foods, very hot foods/liquids or any sharp/jagged foods (chips, pretzels, etc). Start OTC Peroxyl mouthwash 2-3 x daily. This does a great job of cleansing the area and easing discomfort. I am also sending in a lidocaine solution to swish and spit if needed if pain gets more significant. You can use OTC pain relievers like Tylenol and Ibuprofen as well if needed.  If this is not going away within a week or if you note any new or worsening symptoms, you need to be evaluated in person by your PCP or other provider.       While the exact causes are unknown, some common causes and factors that may aggravate mouth ulcers include: . Genetics - Sometimes mouth ulcers run in families . High alcohol intake . Acidic foods such as citrus fruits like pineapple, grapefruit, orange fruits/juices, may aggravate mouth ulcers . Other foods high in acidity or spice such as coffee, chocolate, chips, pretzels, eggs, nuts, cheese . Quitting smoking . Injury caused by biting the tongue or inside of the cheek . Diet lacking in E-52, zinc, folic acid or iron . Male hormone shifts with menstruation . Excessive fatigue, emotional stress or anxiety Prevention: . Talk to your doctor if you are taking meds that are known to cause mouth ulcers such as:   Anti-inflammatory drugs (for example Ibuprofen, Naproxen sodium), pain killers, Beta  blockers, Oral nicotine replacement drugs, Some street drugs (heroin).   . Avoid allowing any tablets to dissolve in your mouth that are meant to swallowed whole . Avoid foods/drinks that trigger or worsen symptoms . Keep your mouth clean with daily brushing and flossing  Home Care: . The goal with treatment is to ease the pain where ulcers occur and help them heal as quickly as possible.  There is no medical treatment to prevent mouth ulcers from coming back or recurring.  . Avoid spicy and acidic foods . Eat soft foods and avoid rough, crunchy foods . Avoid chewing gum . Do not use toothpaste that contains sodium lauryl sulphite . Use a straw to drink which helps avoid liquids toughing the ulcers near the front of your mouth . Use a very soft toothbrush . If you have dentures or dental hardware that you feel is not fitting well or contributing to his, please see your dentist. . Use saltwater mouthwash which helps healing. Dissolve a  teaspoon of salt in a glass of warm water. Swish around your mouth and spit it out. This can be used as needed if it is soothing.   GET HELP RIGHT AWAY IF: . Persistent ulcers require checking IN PERSON (face to face). Any mouth lesion lasting longer than a month should be seen by your DENTIST as soon as possible for evaluation for possible oral cancer. . If you have a non-painful ulcer in 1 or more areas of  your mouth . Ulcers that are spreading, are very large or particularly painful . Ulcers last longer than one week without improving on treatment . If you develop a fever, swollen glands and begin to feel unwell . Ulcers that developed after starting a new medication MAKE SURE YOU:  Understand these instructions.  Will watch your condition.  Will get help right away if you are not doing well or get worse.  Your e-visit answers were reviewed by a board certified advanced clinical practitioner to complete your personal care plan.  Depending upon the  condition, your plan could have included both over the counter or prescription medications.    Please review your pharmacy choice.  Be sure that the pharmacy you have chosen is open so that you can pick up your prescription now.  If there is a problem, you can message your provider in [place] to have the prescription routed to another pharmacy.    Your safety is important to [place].  If you have drug allergies check our prescription carefully.  For the next 24 hours you can use MyChart to ask questions about today's visit, request a non-urgent call back, or ask for a work or school excuse from your e-visit provider.  You will get an email with a survey asking about your experience and to give [place] any feedback.  I hope that your e-visit has been valuable and will speed your recovery.

## 2021-03-01 NOTE — Progress Notes (Signed)
I have spent 5 minutes in review of e-visit questionnaire, review and updating patient chart, medical decision making and response to patient.   Ladislav Caselli Cody Tamya Denardo, PA-C    

## 2021-03-27 ENCOUNTER — Emergency Department (HOSPITAL_COMMUNITY): Payer: 59

## 2021-03-27 ENCOUNTER — Encounter (HOSPITAL_COMMUNITY): Payer: Self-pay | Admitting: Emergency Medicine

## 2021-03-27 ENCOUNTER — Other Ambulatory Visit: Payer: Self-pay

## 2021-03-27 ENCOUNTER — Emergency Department (HOSPITAL_COMMUNITY)
Admission: EM | Admit: 2021-03-27 | Discharge: 2021-03-27 | Disposition: A | Discharge status: Home / Self Care | Payer: 59 | Attending: Emergency Medicine | Admitting: Emergency Medicine

## 2021-03-27 DIAGNOSIS — Z20822 Contact with and (suspected) exposure to covid-19: Secondary | ICD-10-CM | POA: Insufficient documentation

## 2021-03-27 DIAGNOSIS — Z79899 Other long term (current) drug therapy: Secondary | ICD-10-CM | POA: Insufficient documentation

## 2021-03-27 DIAGNOSIS — I6782 Cerebral ischemia: Secondary | ICD-10-CM | POA: Diagnosis not present

## 2021-03-27 DIAGNOSIS — Z7982 Long term (current) use of aspirin: Secondary | ICD-10-CM | POA: Diagnosis not present

## 2021-03-27 DIAGNOSIS — F17210 Nicotine dependence, cigarettes, uncomplicated: Secondary | ICD-10-CM | POA: Insufficient documentation

## 2021-03-27 DIAGNOSIS — I10 Essential (primary) hypertension: Secondary | ICD-10-CM | POA: Diagnosis not present

## 2021-03-27 DIAGNOSIS — R9082 White matter disease, unspecified: Secondary | ICD-10-CM | POA: Diagnosis not present

## 2021-03-27 DIAGNOSIS — R930 Abnormal findings on diagnostic imaging of skull and head, not elsewhere classified: Secondary | ICD-10-CM | POA: Diagnosis not present

## 2021-03-27 DIAGNOSIS — R29818 Other symptoms and signs involving the nervous system: Secondary | ICD-10-CM | POA: Diagnosis not present

## 2021-03-27 DIAGNOSIS — G43909 Migraine, unspecified, not intractable, without status migrainosus: Secondary | ICD-10-CM | POA: Insufficient documentation

## 2021-03-27 DIAGNOSIS — Y900 Blood alcohol level of less than 20 mg/100 ml: Secondary | ICD-10-CM | POA: Diagnosis not present

## 2021-03-27 DIAGNOSIS — H02402 Unspecified ptosis of left eyelid: Secondary | ICD-10-CM

## 2021-03-27 DIAGNOSIS — E119 Type 2 diabetes mellitus without complications: Secondary | ICD-10-CM | POA: Insufficient documentation

## 2021-03-27 DIAGNOSIS — I639 Cerebral infarction, unspecified: Secondary | ICD-10-CM | POA: Diagnosis not present

## 2021-03-27 DIAGNOSIS — R000 Tachycardia, unspecified: Secondary | ICD-10-CM | POA: Diagnosis not present

## 2021-03-27 DIAGNOSIS — E236 Other disorders of pituitary gland: Secondary | ICD-10-CM | POA: Diagnosis not present

## 2021-03-27 LAB — URINALYSIS, ROUTINE W REFLEX MICROSCOPIC
Bilirubin Urine: NEGATIVE
Glucose, UA: NEGATIVE mg/dL
Hgb urine dipstick: NEGATIVE
Ketones, ur: NEGATIVE mg/dL
Leukocytes,Ua: NEGATIVE
Nitrite: NEGATIVE
Protein, ur: NEGATIVE mg/dL
Specific Gravity, Urine: 1.002 — ABNORMAL LOW (ref 1.005–1.030)
pH: 7 (ref 5.0–8.0)

## 2021-03-27 LAB — RAPID URINE DRUG SCREEN, HOSP PERFORMED
Amphetamines: NOT DETECTED
Barbiturates: NOT DETECTED
Benzodiazepines: NOT DETECTED
Cocaine: NOT DETECTED
Opiates: NOT DETECTED
Tetrahydrocannabinol: NOT DETECTED

## 2021-03-27 LAB — COMPREHENSIVE METABOLIC PANEL
ALT: 35 U/L (ref 0–44)
AST: 37 U/L (ref 15–41)
Albumin: 4.1 g/dL (ref 3.5–5.0)
Alkaline Phosphatase: 80 U/L (ref 38–126)
Anion gap: 9 (ref 5–15)
BUN: 11 mg/dL (ref 8–23)
CO2: 27 mmol/L (ref 22–32)
Calcium: 9.6 mg/dL (ref 8.9–10.3)
Chloride: 108 mmol/L (ref 98–111)
Creatinine, Ser: 0.87 mg/dL (ref 0.61–1.24)
GFR, Estimated: >60 mL/min (ref >60)
Glucose, Bld: 94 mg/dL (ref 70–99)
Potassium: 4.4 mmol/L (ref 3.5–5.1)
Sodium: 144 mmol/L (ref 135–145)
Total Bilirubin: 0.8 mg/dL (ref 0.3–1.2)
Total Protein: 7.2 g/dL (ref 6.5–8.1)

## 2021-03-27 LAB — I-STAT CHEM 8, ED
BUN: 9 mg/dL (ref 8–23)
Calcium, Ion: 1.18 mmol/L (ref 1.15–1.40)
Chloride: 106 mmol/L (ref 98–111)
Creatinine, Ser: 0.8 mg/dL (ref 0.61–1.24)
Glucose, Bld: 92 mg/dL (ref 70–99)
HCT: 44 % (ref 39.0–52.0)
Hemoglobin: 15 g/dL (ref 13.0–17.0)
Potassium: 3.9 mmol/L (ref 3.5–5.1)
Sodium: 145 mmol/L (ref 135–145)
TCO2: 29 mmol/L (ref 22–32)

## 2021-03-27 LAB — DIFFERENTIAL
Abs Immature Granulocytes: 0.14 10*3/uL — ABNORMAL HIGH (ref 0.00–0.07)
Basophils Absolute: 0.1 10*3/uL (ref 0.0–0.1)
Basophils Relative: 1 %
Eosinophils Absolute: 0.1 10*3/uL (ref 0.0–0.5)
Eosinophils Relative: 1 %
Immature Granulocytes: 1 %
Lymphocytes Relative: 17 %
Lymphs Abs: 2.2 10*3/uL (ref 0.7–4.0)
Monocytes Absolute: 0.7 10*3/uL (ref 0.1–1.0)
Monocytes Relative: 5 %
Neutro Abs: 9.6 10*3/uL — ABNORMAL HIGH (ref 1.7–7.7)
Neutrophils Relative %: 75 %

## 2021-03-27 LAB — CBC
HCT: 51.1 % (ref 39.0–52.0)
Hemoglobin: 16.4 g/dL (ref 13.0–17.0)
MCH: 29.4 pg (ref 26.0–34.0)
MCHC: 32.1 g/dL (ref 30.0–36.0)
MCV: 91.6 fL (ref 80.0–100.0)
Platelets: 244 10*3/uL (ref 150–400)
RBC: 5.58 MIL/uL (ref 4.22–5.81)
RDW: 14.8 % (ref 11.5–15.5)
WBC: 12.7 10*3/uL — ABNORMAL HIGH (ref 4.0–10.5)
nRBC: 0 % (ref 0.0–0.2)

## 2021-03-27 LAB — PROTIME-INR
INR: 0.9 (ref 0.8–1.2)
Prothrombin Time: 12.2 seconds (ref 11.4–15.2)

## 2021-03-27 LAB — RESP PANEL BY RT-PCR (FLU A&B, COVID) ARPGX2
Influenza A by PCR: NEGATIVE
Influenza B by PCR: NEGATIVE
SARS Coronavirus 2 by RT PCR: NEGATIVE

## 2021-03-27 LAB — ETHANOL: Alcohol, Ethyl (B): <10 mg/dL (ref <10)

## 2021-03-27 LAB — CBG MONITORING, ED: Glucose-Capillary: 117 mg/dL — ABNORMAL HIGH (ref 70–99)

## 2021-03-27 LAB — APTT: aPTT: 28 seconds (ref 24–36)

## 2021-03-27 IMAGING — CT CT HEAD CODE STROKE
3 series · 14 of 47 positions shown, 16 images · non-contrast
Comparison: No pertinent prior exams available for comparison.

CLINICAL DATA: Code stroke. Neuro deficit, acute, stroke suspected.
Left eye droop and feeling "off." "Eye floaters."

EXAM:
CT HEAD WITHOUT CONTRAST
TECHNIQUE: Contiguous axial images were obtained from the base of the skull
through the vertex without intravenous contrast.

[Series 3: head wo · axial · 0.47mm/px · z∈[-121,+4]mm · 8 of 30 slices shown, 10 images]
[im 3/30  brain]
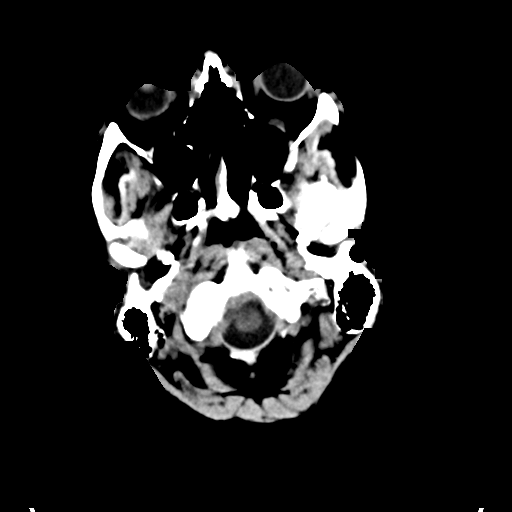
[im 3/30  bone]
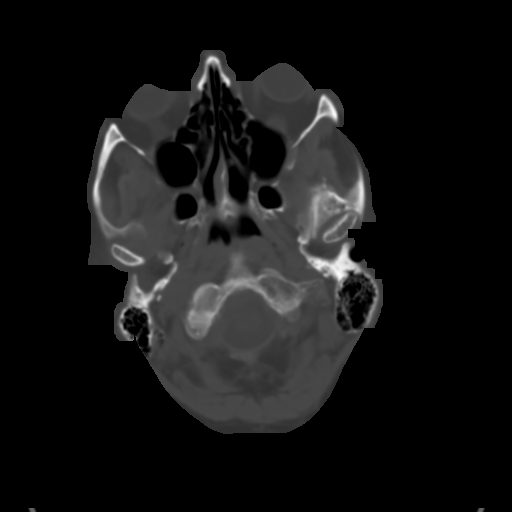
[im 7/30  brain]
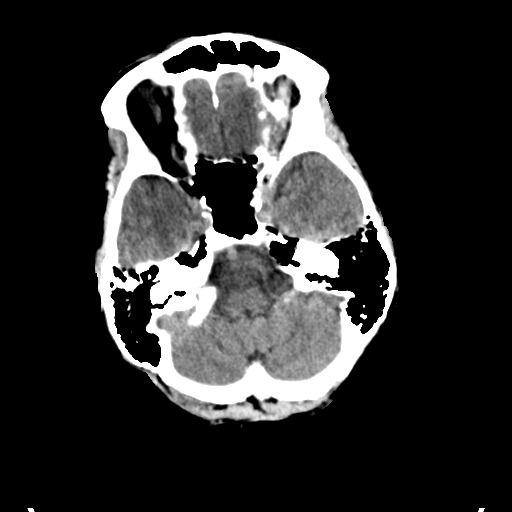
[im 10/30  brain]
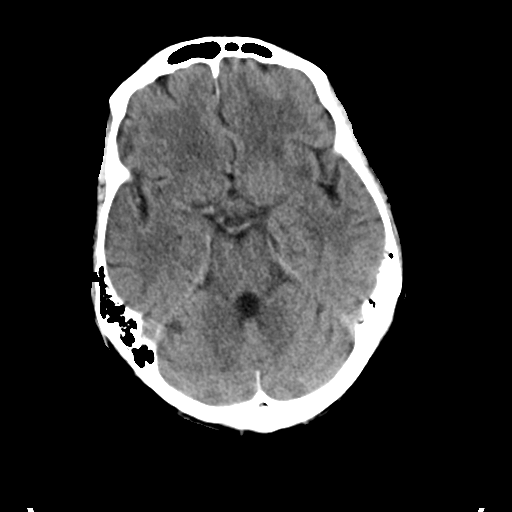
[im 14/30  brain]
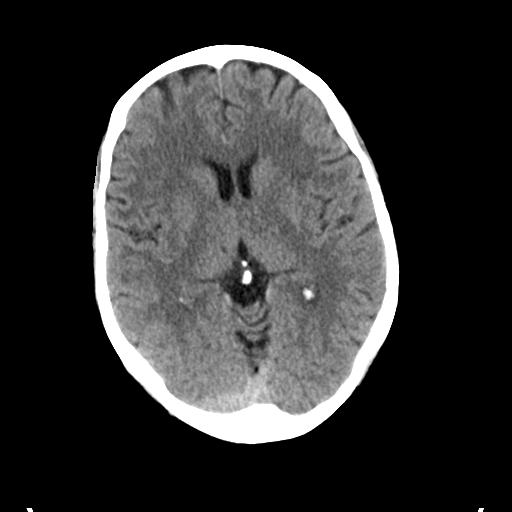
[im 17/30  brain]
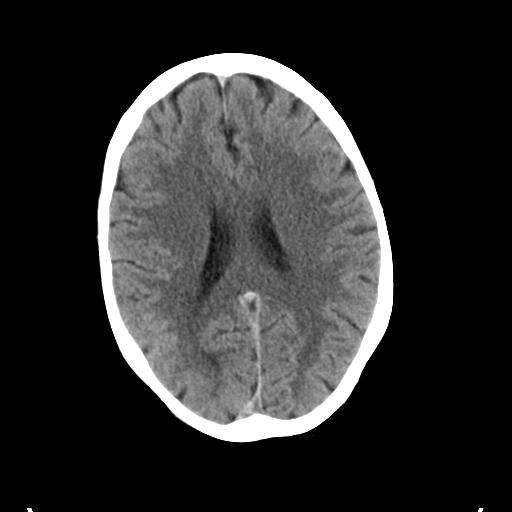
[im 17/30  bone]
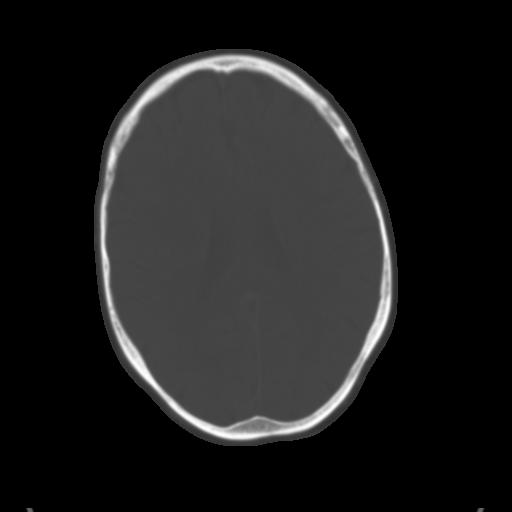
[im 21/30  brain]
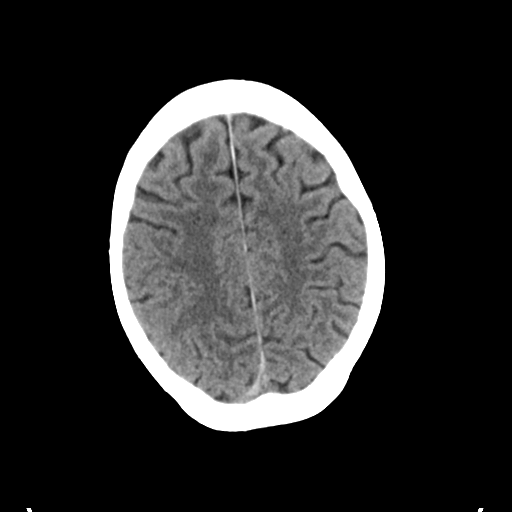
[im 24/30  brain]
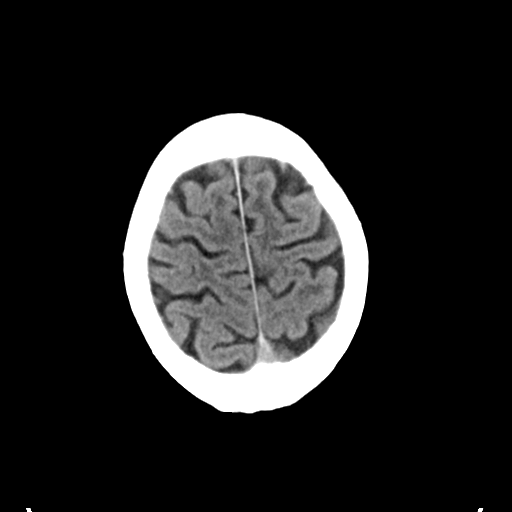
[im 28/30  brain]
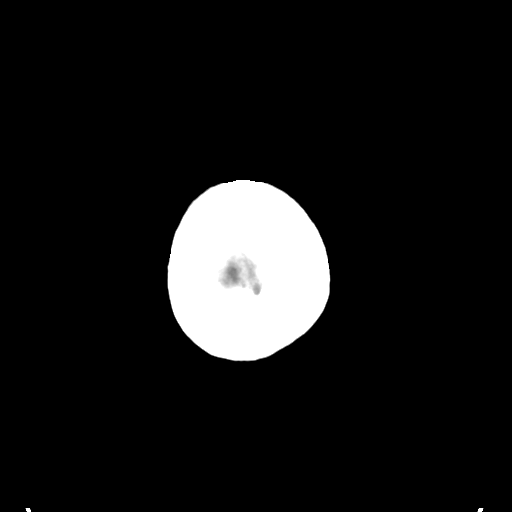

[Series 6: coronal soft tissue · coronal · 0.30mm/px · 3 of 69 slices shown]
[im 23/69  brain]
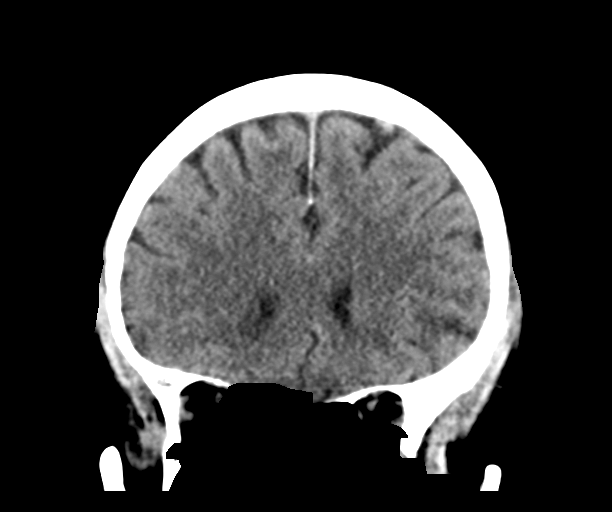
[im 31/69  brain]
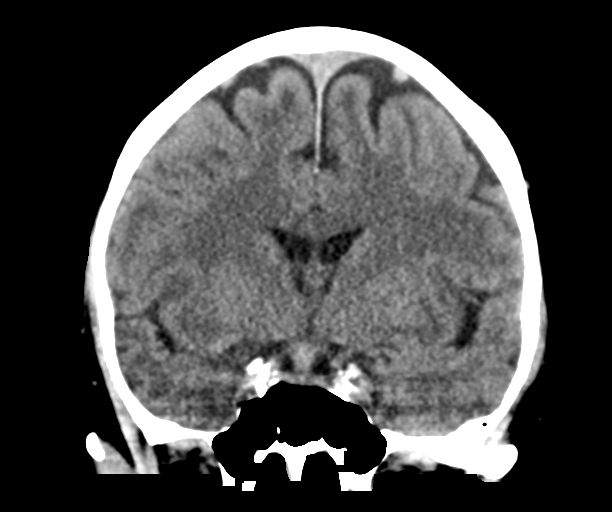
[im 38/69  brain]
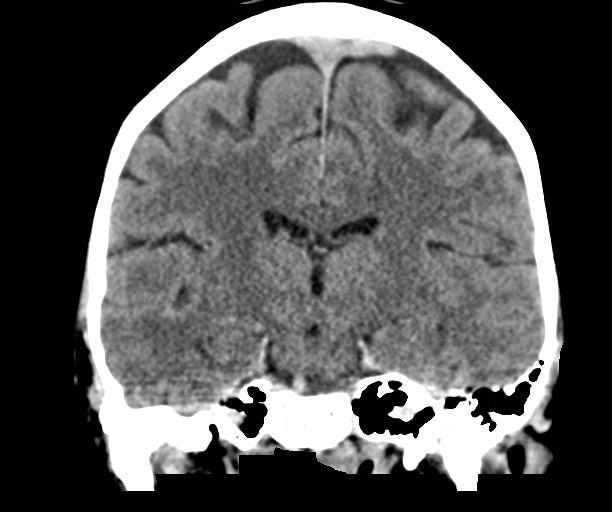

[Series 7: sagittal soft tissue · sagittal · 0.30mm/px · 3 of 54 slices shown]
[im 18/54  brain]
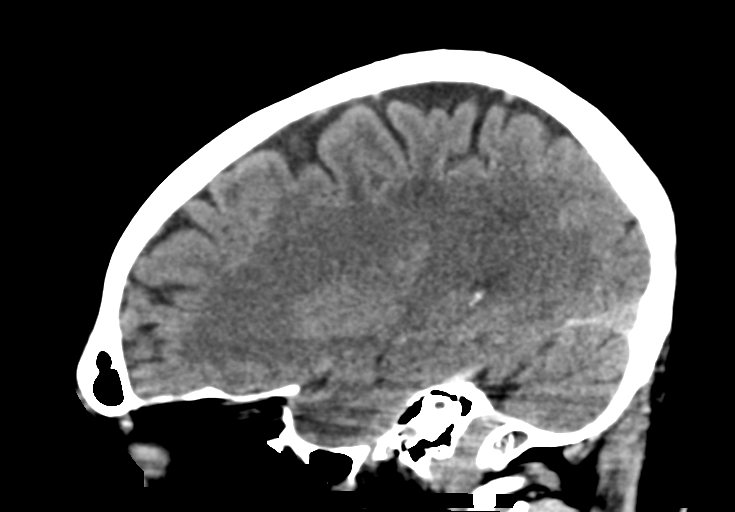
[im 27/54  brain]
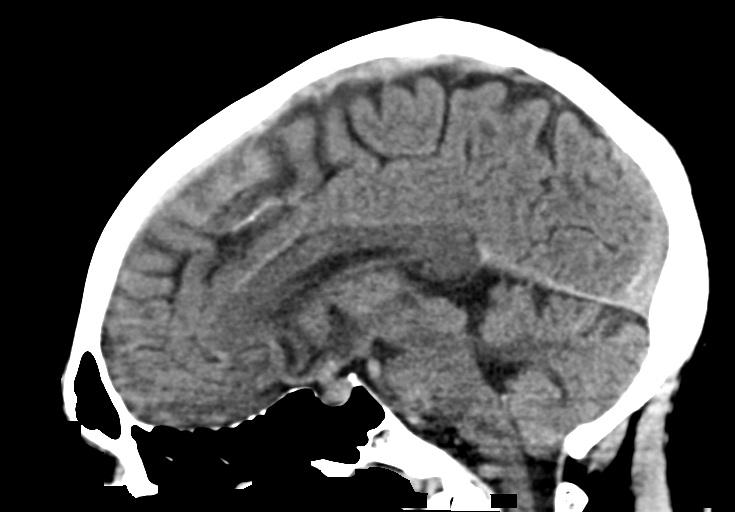
[im 36/54  brain]
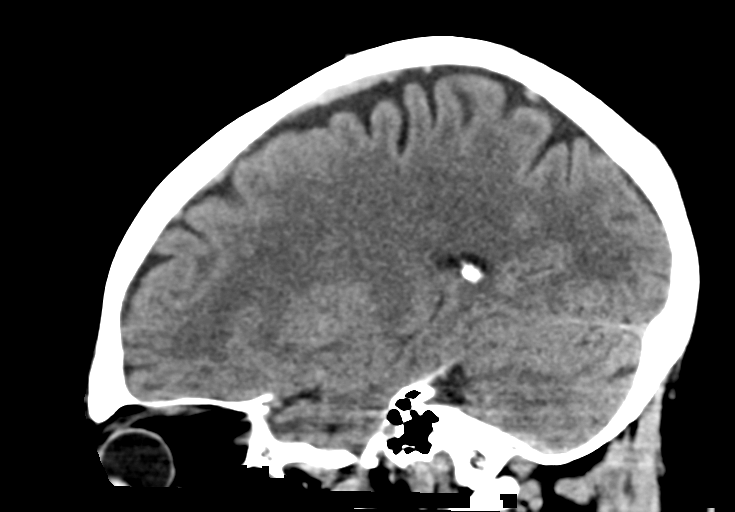

[14 of 47 positions shown; findings below may reference images not displayed]

FINDINGS: Brain:

Cerebral volume is normal for age.

Age-indeterminate lacunar infarct within the right thalamus (series
3, image 14).

Mild patchy and ill-defined hypoattenuation within the cerebral
white matter, nonspecific but compatible with chronic small vessel
ischemic disease.

There is no acute intracranial hemorrhage.

No demarcated cortical infarct.

No extra-axial fluid collection.

No evidence of intracranial mass.

No midline shift.

Nonspecific thickening of the pituitary stalk, measuring 6 mm in
width.

Vascular: No hyperdense vessel.  Atherosclerotic calcifications.

Skull: Normal. Negative for fracture or focal lesion.

Sinuses/Orbits: Visualized orbits show no acute finding. Trace
mucosal thickening within the partially imaged right maxillary
sinus.

ASPECTS (Alberta Stroke Program Early CT Score)

- Ganglionic level infarction (caudate, lentiform nuclei, internal
capsule, insula, M1-M3 cortex): 7

- Supraganglionic infarction (M4-M6 cortex): 3

Total score (0-10 with 10 being normal): 10

These results were communicated to [REDACTED] At [DATE] pmon
[DATE]by text page via the [employer] messaging system.
IMPRESSION: No evidence of acute cortical infarct or acute intracranial
hemorrhage.

Age-indeterminate lacunar infarct within the right thalamus.

Nonspecific thickening of the pituitary stalk, measuring 6 mm in
width. A contrast enhanced brain MRI with pituitary protocol is
recommended for further characterization, and to exclude a lesion at
this site.

Mild cerebral white matter chronic small vessel ischemic disease.

## 2021-03-27 IMAGING — MR MR HEAD W/O CM
16 of 18 series · 41 of 48 positions shown · IV contrast (gadavist)
Comparison: Same day CT head.

CLINICAL DATA: Neuro deficit, acute stroke suspected possible
vision changes.

EXAM:
MRI HEAD WITHOUT CONTRAST
MRA HEAD WITHOUT CONTRAST
MRA NECK WITHOUT AND WITH CONTRAST
TECHNIQUE: Multiplanar, multi-echo pulse sequences of the brain and surrounding
structures were acquired without intravenous contrast. Angiographic
images of the Circle of Willis were acquired using MRA technique
without intravenous contrast. Angiographic images of the neck were
acquired using MRA technique without and with intravenous contrast.
Carotid stenosis measurements (when applicable) are obtained
utilizing NASCET criteria, using the distal internal carotid
diameter as the denominator.
CONTRAST:  7mL GADAVIST GADOBUTROL 1 MMOL/ML IV SOLN

[Series 5: DWI · axial · 3.0mm · 1.36mm/px · z∈[-55,+90]mm · 2 of 100 slices shown (1 of 2)]
[im 1/100]
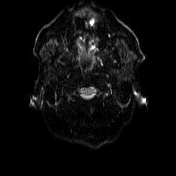
[im 100/100]
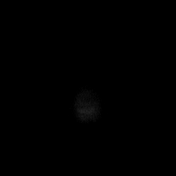

[Series 6: DWI · axial · 3.0mm · 1.36mm/px · 1 of 50 slices shown (2 of 2)]
[im 1/50]
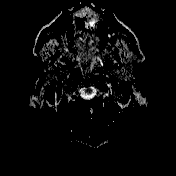

[Series 7: T1 · sagittal · 5.0mm · 0.75mm/px · 1 of 24 slices shown (1 of 2)]
[im 1/24]
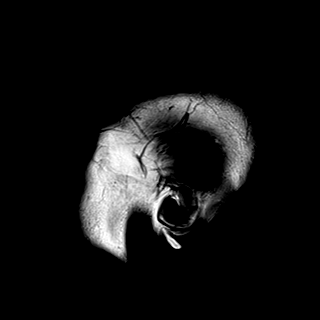

[Series 8: T2 · axial · 5.0mm · 0.62mm/px · 1 of 26 slices shown (1 of 2)]
[im 1/26]
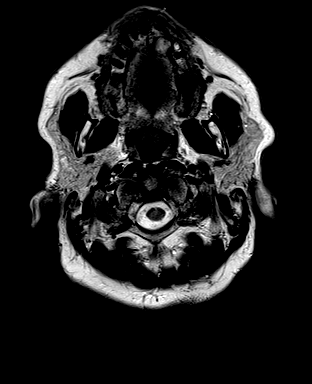

[Series 9: swi_images · axial · 3.0mm · 0.75mm/px · z∈[-57,+94]mm · 2 of 52 slices shown]
[im 1/52]
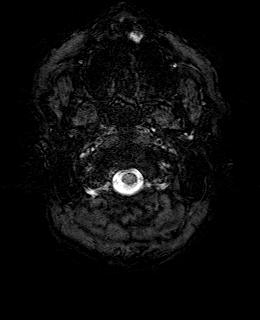
[im 52/52]
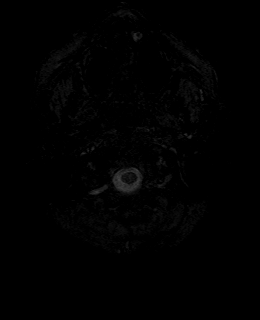

[Series 11: FLAIR · axial · 3.0mm · 0.75mm/px · z∈[-57,+94]mm · 2 of 52 slices shown]
[im 1/52]
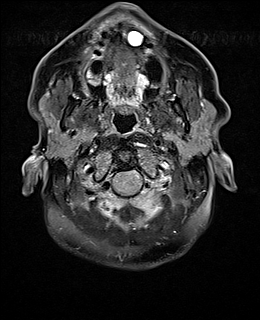
[im 52/52]
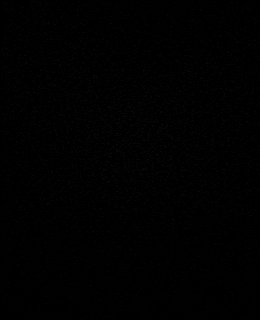

[Series 20: T1 · axial · 1.0mm · 0.94mm/px · z∈[-59,+98]mm · 6 of 160 slices shown (2 of 2)]
[im 1/160]
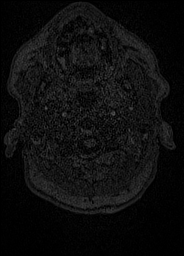
[im 32/160]
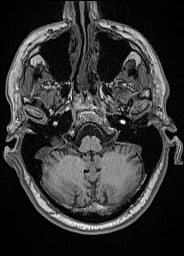
[im 64/160]
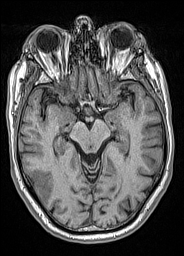
[im 96/160]
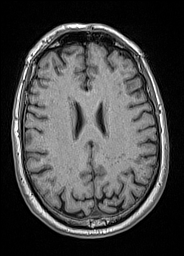
[im 128/160]
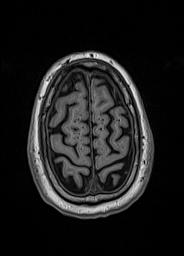
[im 160/160]
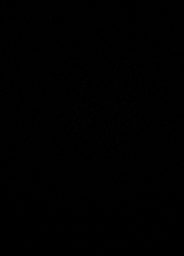

[Series 21: cor dwi_tracew · coronal · 5.0mm · 1.53mm/px · 2 of 58 slices shown]
[im 1/58]
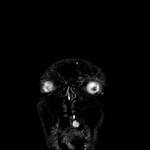
[im 58/58]
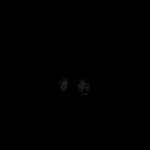

[Series 22: cor dwi_adc · coronal · 5.0mm · 1.53mm/px · 1 of 29 slices shown]
[im 1/29]
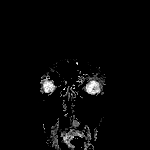

[Series 23: T2 · coronal · 5.0mm · 0.57mm/px · 1 of 32 slices shown (2 of 2)]
[im 1/32]
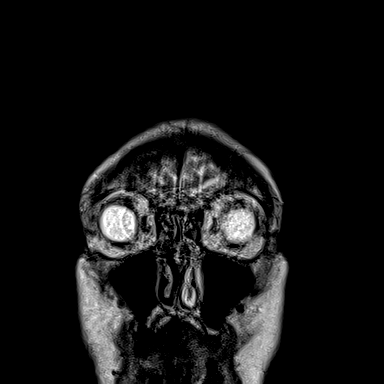

[Series 26: tof_2d_tra · axial · 3.5mm · 0.43mm/px · z∈[-224,-23]mm · 3 of 83 slices shown]
[im 1/83]
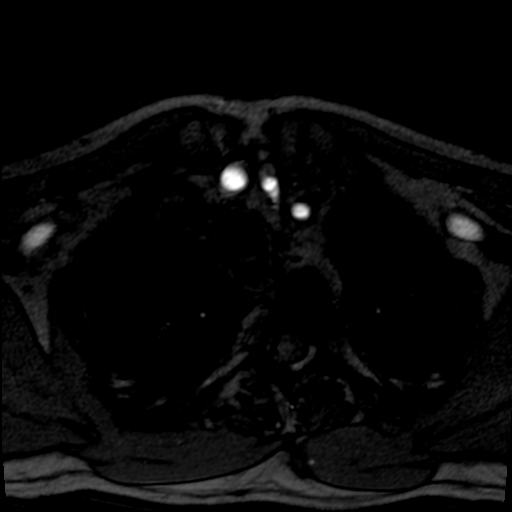
[im 42/83]
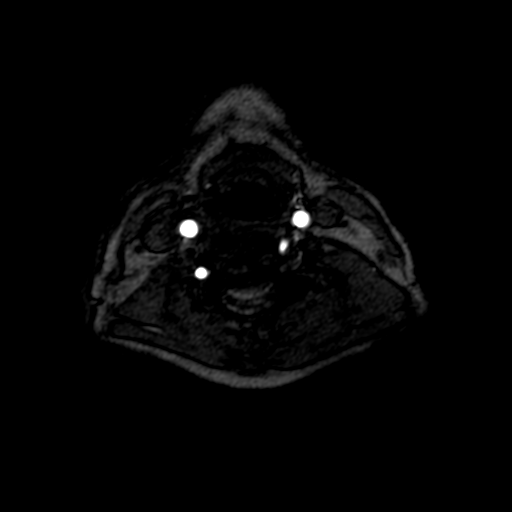
[im 83/83]
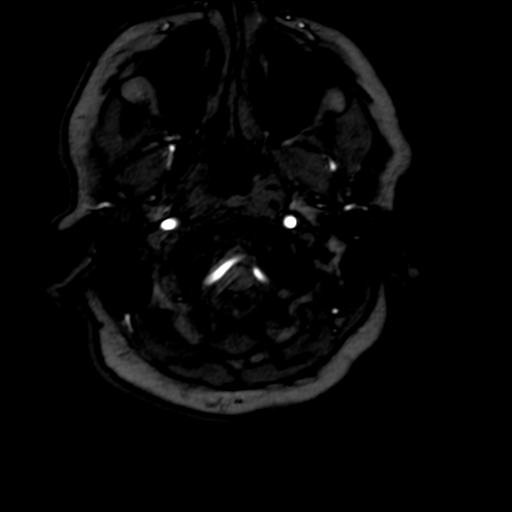

[Series 30: (id)_cor_pre · coronal · 0.8mm · 0.78mm/px · 4 of 112 slices shown]
[im 1/112]
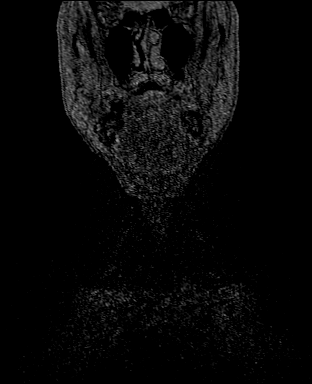
[im 38/112]
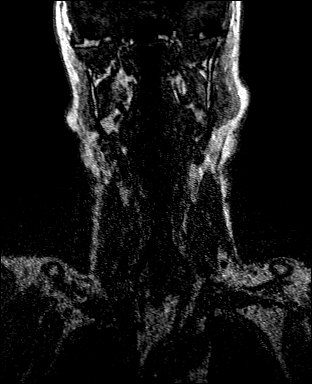
[im 75/112]
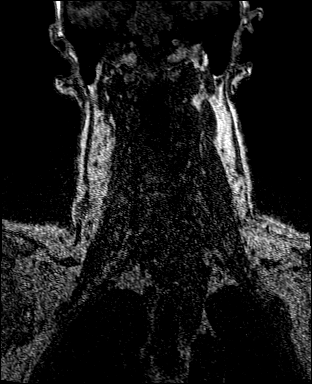
[im 112/112]
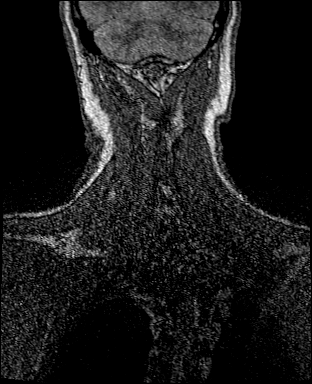

[Series 32: (id)_cor_post · coronal · 0.8mm · 0.78mm/px · 4 of 105 slices shown]
[im 1/105]
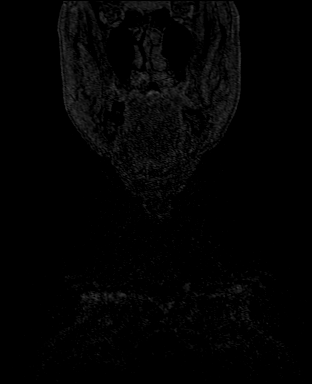
[im 35/105]
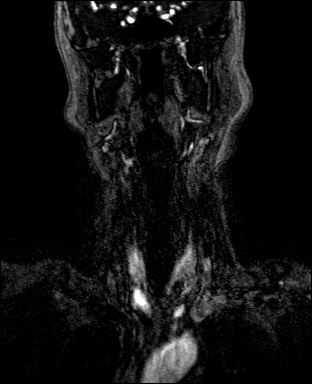
[im 70/105]
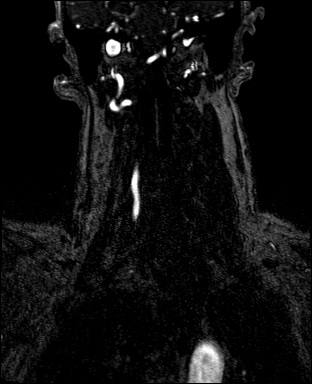
[im 105/105]
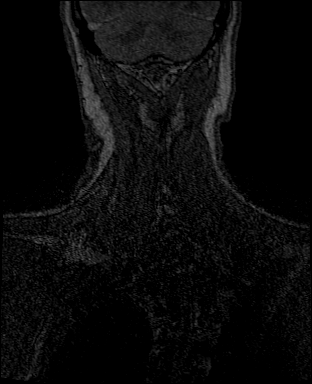

[Series 33: (id)_cor_post_sub · coronal · 0.8mm · 0.78mm/px · 3 of 97 slices shown]
[im 1/97]
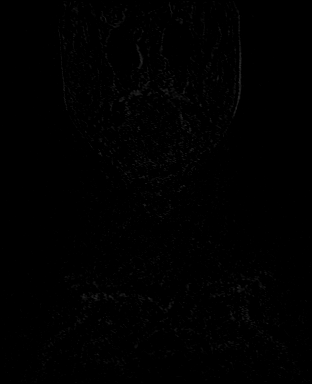
[im 49/97]
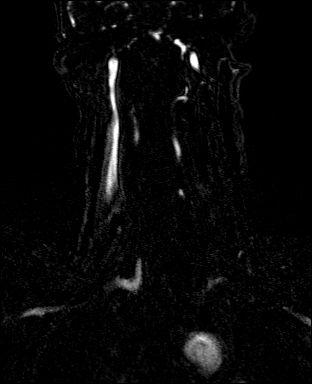
[im 97/97]
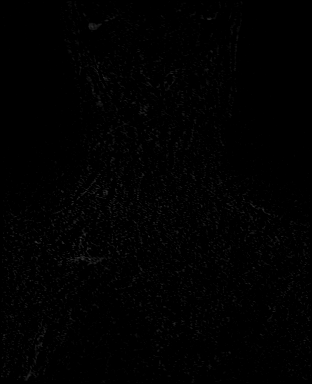

[Series 35: (id)_cor_post_venous · coronal · 0.8mm · 0.78mm/px · 4 of 112 slices shown]
[im 1/112]
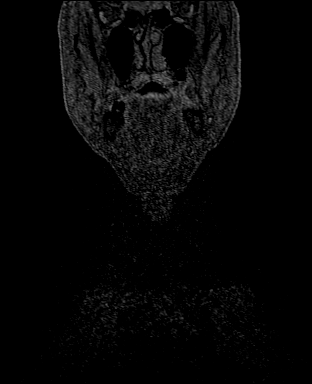
[im 38/112]
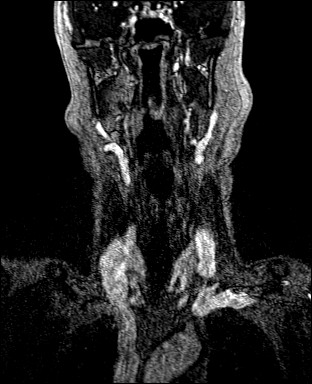
[im 75/112]
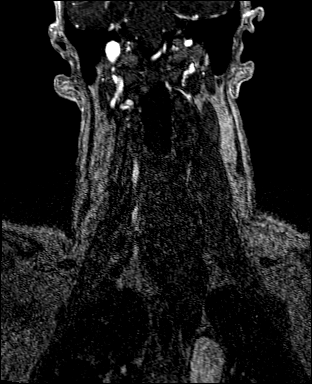
[im 112/112]
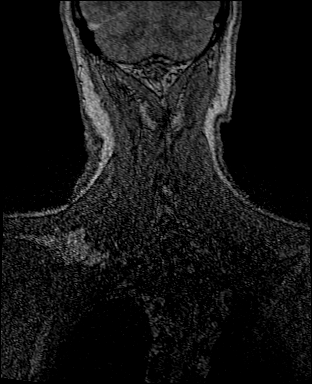

[Series 36: (id)_cor_post_venous_sub · coronal · 0.8mm · 0.78mm/px · 4 of 111 slices shown]
[im 1/111]
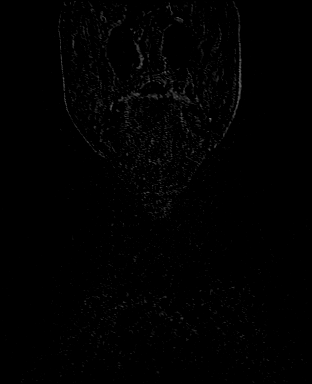
[im 37/111]
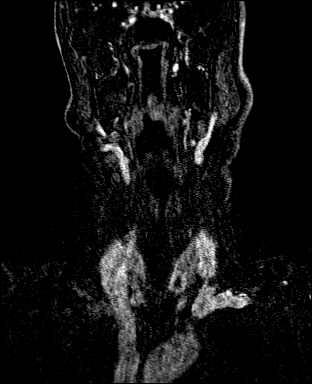
[im 74/111]
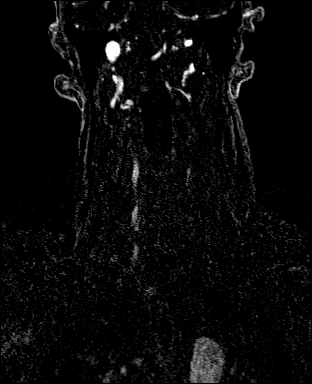
[im 111/111]
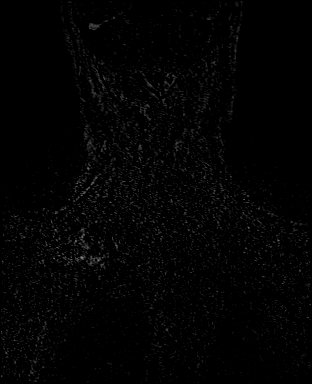

[41 of 48 positions shown; findings below may reference images not displayed]

FINDINGS: MRI HEAD FINDINGS
Intermittently motion limited exam.

Brain: No acute infarction, hemorrhage, or hydrocephalus. Mild
scattered T2/FLAIR hyperintensities within the white matter,
nonspecific but most likely related to chronic microvascular
ischemic disease.

Vascular: See below.

Skull and upper cervical spine: Normal marrow signal.

Sinuses/Orbits: Clear sinuses.  Unremarkable orbits.

Other: No mastoid effusions. Nonspecific cyst in the left
paramidline maxilla.

MRA HEAD FINDINGS

Mildly motion limited evaluation.

Anterior circulation: No large vessel occlusion or proximal
hemodynamically significant stenosis. No visible aneurysm. Early
left MCA bifurcation.

Posterior circulation: No large vessel occlusion or proximal
hemodynamically significant stenosis. No visible aneurysm.

MRA NECK FINDINGS

Limited by venous contamination.

Aortic arch: Great vessel origins are patent.

Right carotid system: Patent without evidence of significant
(greater than 50%) stenosis.

Left carotid system: Patent without evidence of significant (greater
than 50%) stenosis. Limited visualization of the origin.

Vertebral arteries: Right dominant. The small/non dominant left
vertebral artery poorly characterized proximally. Right vertebral
artery is patent without evidence of hemodynamically significant
stenosis.
IMPRESSION: MRI:

1. No acute infarct.
2. Redemonstrated masslike thickening of the infundibulum. Recommend
pituitary protocol MRI with contrast to further evaluate and exclude
a lesion at this site, particularly given the patient's reported
vision changes and proximity to the optic chiasm.
3. Mild chronic microvascular disease.

MRA Head:

No evidence of large vessel occlusion proximal hemodynamically
significant stenosis.

MRA neck:

1. Limited by venous timing with poor visualization of the non
dominant left vertebral artery proximally. Stenosis is not excluded
in this regions and CTA neck could further characterize if
clinically indicated.
2. Otherwise, no visible hemodynamically significant stenosis.

## 2021-03-27 MED ORDER — SODIUM CHLORIDE 0.9 % IV BOLUS
500.0000 mL | Freq: Once | INTRAVENOUS | Status: AC
  Administered 2021-03-27: 500 mL via INTRAVENOUS

## 2021-03-27 MED ORDER — GADOBUTROL 1 MMOL/ML IV SOLN
7.0000 mL | Freq: Once | INTRAVENOUS | Status: AC | PRN
  Administered 2021-03-27: 7 mL via INTRAVENOUS

## 2021-03-27 MED ORDER — SODIUM CHLORIDE 0.9 % IV SOLN
100.0000 mL/h | INTRAVENOUS | Status: DC
  Administered 2021-03-27: 100 mL/h via INTRAVENOUS

## 2021-03-27 NOTE — ED Notes (Signed)
Symptoms started at 8:30 am with Left eye droop

## 2021-03-27 NOTE — ED Triage Notes (Signed)
Patient endorses L eye drooping since 0830 this morning, endorses he may have had vision changes this AM, but none now, endorses a 'touch' of balance issues earlier, denies any at the current time. Alert and oriented x4.

## 2021-03-27 NOTE — ED Notes (Signed)
Patient transported to MRI via stretcher.

## 2021-03-27 NOTE — Consult Note (Signed)
NEUROLOGY TELECONSULTATION NOTE   Date of service: March 27, 2021 Patient Name: Bradley Goodman MRN:  [id number] DOB:  [birth year] Reason for consult: L eye drooping, transient L eye visual disturbance now resolved  Requesting Provider: Dr. Hurshel Party Consult Participants: myseld, patient, wife Anne Ng, atrium RN, bedside RN Location of the provider: [facility] Location of the patient: Bradley Goodman  This consult was provided via telemedicine with 2-way video and audio communication. The patient/family was informed that care would be provided in this way and agreed to receive care in this manner.   _ _ _   _ __   _ __ _ _  __ __   _ __   __ _  History of Present Illness   Bradley Goodman is a 65 y.o. male with PMH significant for  has a past medical history of Cervical spondylolysis, Essential hypertension, GERD (gastroesophageal reflux disease), History of kidney stones, History of migraine, Hyperlipidemia, PONV (postoperative nausea and vomiting), and Type 2 diabetes mellitus ([place]). who presents with L eye drooping since 0830 this morning. His lower face is not and has not been droopy. He reports he had some difficulty seeing out of his left eye but that has resolved. He cannot further characterize the transient vision loss and thinks that it may have just been that it was harder to see out of his L eye given the ptosis. He denies other neurologic deficits, no focal weakness or numbness, no dizziness, no headache. NIHSS = 0 (no lower facial palsy, only L ptosis). 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 tPA not administered 2/2 sx not c/w stroke and NIHSS = 0. CTA not performed 2/2 exam not c/w LVO.    ROS   10 point review of systems was performed and was negative except as described in HPI.  Past History   Past Medical History:  Diagnosis Date   Cervical spondylolysis    Essential hypertension    GERD (gastroesophageal reflux disease)    History of kidney stones    History of  migraine    Hyperlipidemia    PONV (postoperative nausea and vomiting)    Type 2 diabetes mellitus ([place])    Past Surgical History:  Procedure Laterality Date   Bilateral inguinal hernia repair     COLONOSCOPY  01/23/2012   Procedure: COLONOSCOPY;  Surgeon: Daneil Dolin, MD;  [facility];  Service: Endoscopy;  Laterality: N/A;  9:30 AM   COLONOSCOPY N/A 10/11/2015   Procedure: COLONOSCOPY;  Surgeon: Daneil Dolin, MD;  [facility]: [facility];  Service: Endoscopy;  Laterality: N/A;  830   COLONOSCOPY N/A 10/14/2019   Procedure: COLONOSCOPY;  Surgeon: Daneil Dolin, MD;  [facility]: [facility];  Service: Endoscopy;  Laterality: N/A;  1:45   POLYPECTOMY  10/14/2019   Procedure: POLYPECTOMY;  Surgeon: Daneil Dolin, MD;  Location: [facility];  Service: Endoscopy;;  ascending colon, descending colon   Family History  Problem Relation Age of Onset   Heart attack Mother    Heart attack Father    Heart attack Brother    Colon cancer Neg Hx    Social History   Socioeconomic History   Marital status: Married    Spouse name: Not on file   Number of children: Not on file   Years of education: Not on file   Highest education level: Not on file  Occupational History   Not on file  Tobacco Use   Smoking status: Every  Day    Packs/day: 1.00    Years: 30.00    Pack years: 30.00    Types: Cigarettes    Start date: 10/30/1973   Smokeless tobacco: Never  Vaping Use   Vaping Use: Never used  Substance and Sexual Activity   Alcohol use: Yes    Alcohol/week: 0.0 standard drinks    Comment: One drink every 6 months.   Drug use: No   Sexual activity: Not on file  Other Topics Concern   Not on file  Social History Narrative   Not on file   Social Determinants of Health   Financial Resource Strain: Not on file  Food Insecurity: Not on file  Transportation Needs: Not on file  Physical Activity: Not on file  Stress: Not on file  Social Connections: Not on file   No  Known Allergies  Medications   Patient does take daily aspirin    Vitals   Vitals:   03/27/21 1155 03/27/21 1200 03/27/21 1242 03/27/21 1300  BP:  (!) 168/123  (!) 157/103  Pulse:  (!) 102 (!) 103 (!) 101  Resp:  15 (!) 23 20  Temp:      TempSrc:      SpO2:  100% 97% 98%  Weight: 75.8 kg     Height: 6' (1.829 m)        Body mass index is 22.65 kg/m.  Physical Exam   Exam performed over telemedicine with 2-way video and audio communication and with assistance of bedside RN  Physical Exam Gen: A&O x4, NAD Resp: normal WOB CV: extremities appear well-perfused  Neuro: *MS: A&O x4. Follows multi-step commands.  *Speech: nondysarthric, no aphasia, able to name and repeat *CN: PERRL 37mm, EOMI, VFF by confrontation, L ptosis, sensation intact, smile symmetric, hearing intact to voice *Motor:   Normal bulk.  No tremor, rigidity or bradykinesia. No pronator drift. All extremities appear full-strength and symmetric. *Sensory: SILT. Symmetric. No double-simultaneous extinction.  *Coordination:  Finger-to-nose, heel-to-shin, rapid alternating motions were intact. *Reflexes:  UTA 2/2 tele-exam *Gait: deferred  NIHSS = 0   Premorbid mRS = 0   Labs   CBC:  Recent Labs  Lab 03/27/21 1154 03/27/21 1209  WBC 12.7*  --   NEUTROABS 9.6*  --   HGB 16.4 15.0  HCT 51.1 44.0  MCV 91.6  --   PLT 244  --     Basic Metabolic Panel:  Lab Results  Component Value Date   NA 145 03/27/2021   K 3.9 03/27/2021   CO2 23 01/10/2021   GLUCOSE 92 03/27/2021   BUN 9 03/27/2021   CREATININE 0.80 03/27/2021   CALCIUM 9.6 01/10/2021   GFRNONAA 71 07/14/2020   GFRAA 82 07/14/2020   Lipid Panel:  Lab Results  Component Value Date   LDLCALC 33 01/10/2021   HgbA1c:  Lab Results  Component Value Date   HGBA1C 6.2 01/10/2021   Urine Drug Screen: No results found for: LABOPIA, COCAINSCRNUR, LABBENZ, AMPHETMU, THCU, LABBARB  Alcohol Level     Component Value Date/Time   ETH  <10 03/27/2021 1147      Impression   Mr. Gripp p/w L ptosis since 0830 without other sx. Ddx includes Bell's palsy (unlikely because at least on my tele-exam his forehead and lower mouth on left both appear to elevate), myasthenia gravis (though no fatiguable upgaze, again on my limited tele-exam). Per EDP, no vesicle seen on otologic examination. Carotid aneurysm may cause a painful Horner's, which I cannot  rule out with tele-exam, and I have recommended MRA or CTA H&N to rule this out. A right thalamic lacunar infarct as seen on his head CT should not cause an isolated ptosis but I did recommend MRI brain to rule out this being an incidental but acute finding; if it is acute he will need tx to [facility] for stroke w/u. If above all negative he may be discharged from ED with close outpatient neurology f/u.  Recommendations   - MRI brain wo contrast - MRA or CTA H&N - Call back [phone] if above abnormal - Otherwise ok to d/c from ED with close outpatient f/u. ______________________________________________________________________   Thank you for the opportunity to take part in the care of this patient. If you have any further questions, please contact the neurology consultation attending.  Signed,  Su Monks, MD [facility] [phone]  If 7pm- 7am, please page neurology on call as listed in [place].

## 2021-03-27 NOTE — ED Provider Notes (Signed)
[facility] DEPT Provider Note   CSN: [id number] Arrival date & time: 03/27/21  1139     History No chief complaint on file.   Bradley Goodman is a 65 y.o. male.  Pt presents to the ED today with left eye lid drooping.  Pt had some visual disturbances earlier this am.  Those are now gone.  Pt said he woke up like normal and then noticed that he had trouble seeing in his left eye around 0830.  [place] the beach, but decided to drive back here.  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  He denies any trouble with moving, speaking, or swallowing.      Past Medical History:  Diagnosis Date   Cervical spondylolysis    Essential hypertension    GERD (gastroesophageal reflux disease)    History of kidney stones    History of migraine    Hyperlipidemia    PONV (postoperative nausea and vomiting)    Type 2 diabetes mellitus ([place])     Patient Active Problem List   Diagnosis Date Noted   Tobacco use 07/14/2020   Mixed hyperlipidemia 11/09/2019   Vitamin D deficiency 07/08/2019   Depression, recurrent ([place]) 07/08/2019   History of colonic polyps    History of adenomatous polyp of colon 09/14/2015   GERD (gastroesophageal reflux disease) 06/24/2013   Prediabetes 06/24/2013   Essential hypertension 06/24/2013    Past Surgical History:  Procedure Laterality Date   Bilateral inguinal hernia repair     COLONOSCOPY  01/23/2012   Procedure: COLONOSCOPY;  Surgeon: Daneil Dolin, MD;  Location: [facility];  Service: Endoscopy;  Laterality: N/A;  9:30 AM   COLONOSCOPY N/A 10/11/2015   Procedure: COLONOSCOPY;  Surgeon: Daneil Dolin, MD;  Location: [facility];  Service: Endoscopy;  Laterality: N/A;  830   COLONOSCOPY N/A 10/14/2019   Procedure: COLONOSCOPY;  Surgeon: Daneil Dolin, MD;  Location: [facility];  Service: Endoscopy;  Laterality: N/A;  1:45   POLYPECTOMY  10/14/2019   Procedure: POLYPECTOMY;  Surgeon: Daneil Dolin, MD;  Location: [facility];  Service:  Endoscopy;;  ascending colon, descending colon       Family History  Problem Relation Age of Onset   Heart attack Mother    Heart attack Father    Heart attack Brother    Colon cancer Neg Hx     Social History   Tobacco Use   Smoking status: Every Day    Packs/day: 1.00    Years: 30.00    Pack years: 30.00    Types: Cigarettes    Start date: 10/30/1973   Smokeless tobacco: Never  Vaping Use   Vaping Use: Never used  Substance Use Topics   Alcohol use: Yes    Alcohol/week: 0.0 standard drinks    Comment: One drink every 6 months.   Drug use: No    Home Medications Prior to Admission medications   Medication Sig Start Date End Date Taking? Authorizing Provider  aspirin EC 81 MG tablet Take 81 mg by mouth every evening.    [provider]  cholecalciferol (VITAMIN D) 1000 UNITS tablet Take 2,000 Units by mouth every evening. Pt takes two tablets daily    [provider]  Javier Docker Oil 300 MG CAPS Take 1 capsule by mouth every evening.    [provider]  lidocaine (XYLOCAINE) 2 % solution Use as directed 15 mLs in the mouth or throat 3 (three)  times daily as needed for mouth pain. Swish and spit 03/01/21   Brunetta Jeans, PA-C  loratadine (CLARITIN) 10 MG tablet Take 10 mg by mouth every evening.     [provider]  losartan (COZAAR) 50 MG tablet TAKE 1 AND 1/2 TABLETS BY MOUTH DAILY 01/10/21 01/10/22  Hendricks Limes F, FNP  pantoprazole (PROTONIX) 40 MG tablet TAKE 1 TABLET BY MOUTH EVERY EVENING. 03/09/20 03/09/21  Evelina Dun A, FNP  rosuvastatin (CRESTOR) 20 MG tablet Take 1 tablet (20 mg total) by mouth daily. 01/10/21   [name], FNP  venlafaxine XR (EFFEXOR-XR) 75 MG 24 hr capsule Take 1 capsule (75 mg total) by mouth daily with breakfast. 01/10/21   [name], FNP    Allergies    Patient has no known allergies.  Review of Systems   Review of Systems  HENT:         Eye lid drooping  Eyes:  Positive for visual  disturbance.  All other systems reviewed and are negative.  Physical Exam Updated Vital Signs BP (!) 143/102 (BP Location: Right Arm)   Pulse 92   Temp 98 F (36.7 C) (Oral)   Resp 19   Ht 6' (1.829 m)   Wt 75.8 kg   SpO2 98%   BMI 22.65 kg/m   Physical Exam Vitals and nursing note reviewed.  Constitutional:      Appearance: Normal appearance.  HENT:     Head: Normocephalic and atraumatic.     Right Ear: External ear normal.     Left Ear: External ear normal.     Nose: Nose normal.     Mouth/Throat:     Mouth: Mucous membranes are moist.     Pharynx: Oropharynx is clear.  Eyes:     Extraocular Movements: Extraocular movements intact.     Conjunctiva/sclera: Conjunctivae normal.     Pupils: Pupils are equal, round, and reactive to light.     Comments: Left eyelid ptosis  Cardiovascular:     Rate and Rhythm: Normal rate and regular rhythm.     Pulses: Normal pulses.     Heart sounds: Normal heart sounds.  Pulmonary:     Effort: Pulmonary effort is normal.     Breath sounds: Normal breath sounds.  Abdominal:     General: Abdomen is flat. Bowel sounds are normal.     Palpations: Abdomen is soft.  Musculoskeletal:        General: Normal range of motion.     Cervical back: Normal range of motion and neck supple.  Skin:    General: Skin is warm.     Capillary Refill: Capillary refill takes less than 2 seconds.  Neurological:     Mental Status: He is alert and oriented to person, place, and time.  Psychiatric:        Mood and Affect: Mood normal.        Behavior: Behavior normal.        Thought Content: Thought content normal.        Judgment: Judgment normal.    ED Results / Procedures / Treatments   Labs (all labs ordered are listed, but only abnormal results are displayed) Labs Reviewed  CBC - Abnormal; Notable for the following components:      Result Value   WBC 12.7 (*)    All other components within normal limits  DIFFERENTIAL - Abnormal; Notable for  the following components:   Neutro Abs 9.6 (*)  Abs Immature Granulocytes 0.14 (*)    All other components within normal limits  URINALYSIS, ROUTINE W REFLEX MICROSCOPIC - Abnormal; Notable for the following components:   Color, Urine COLORLESS (*)    Specific Gravity, Urine 1.002 (*)    All other components within normal limits  CBG MONITORING, ED - Abnormal; Notable for the following components:   Glucose-Capillary 117 (*)    All other components within normal limits  RESP PANEL BY RT-PCR (FLU A&B, COVID) ARPGX2  ETHANOL  PROTIME-INR  APTT  COMPREHENSIVE METABOLIC PANEL  RAPID URINE DRUG SCREEN, HOSP PERFORMED  I-STAT CHEM 8, ED    EKG EKG Interpretation  Date/Time:  Monday March 27 2021 11:58:32 EDT Ventricular Rate:  105 PR Interval:  105 QRS Duration: 96 QT Interval:  342 QTC Calculation: 452 R Axis:   65 Text Interpretation: Sinus tachycardia Probable left atrial enlargement Minimal ST depression, diffuse leads No significant change since last tracing Confirmed by Isla Pence [phone]) on 03/27/2021 12:11:58 PM  Radiology MR ANGIO HEAD WO CONTRAST  Result Date: 03/27/2021 CLINICAL DATA:  Neuro deficit, acute stroke suspected possible vision changes. EXAM: MRI HEAD WITHOUT CONTRAST MRA HEAD WITHOUT CONTRAST MRA NECK WITHOUT AND WITH CONTRAST TECHNIQUE: Multiplanar, multi-echo pulse sequences of the brain and surrounding structures were acquired without intravenous contrast. Angiographic images of the Circle of Willis were acquired using MRA technique without intravenous contrast. Angiographic images of the neck were acquired using MRA technique without and with intravenous contrast. Carotid stenosis measurements (when applicable) are obtained utilizing NASCET criteria, using the distal internal carotid diameter as the denominator. CONTRAST:  67mL GADAVIST GADOBUTROL 1 MMOL/ML IV SOLN COMPARISON:  Same day CT head. FINDINGS: MRI HEAD FINDINGS Intermittently motion limited  exam. Brain: No acute infarction, hemorrhage, or hydrocephalus. Mild scattered T2/FLAIR hyperintensities within the white matter, nonspecific but most likely related to chronic microvascular ischemic disease. Vascular: See below. Skull and upper cervical spine: Normal marrow signal. Sinuses/Orbits: Clear sinuses.  Unremarkable orbits. Other: No mastoid effusions. Nonspecific cyst in the left paramidline maxilla. MRA HEAD FINDINGS Mildly motion limited evaluation. Anterior circulation: No large vessel occlusion or proximal hemodynamically significant stenosis. No visible aneurysm. Early left MCA bifurcation. Posterior circulation: No large vessel occlusion or proximal hemodynamically significant stenosis. No visible aneurysm. MRA NECK FINDINGS Limited by venous contamination. Aortic arch: Great vessel origins are patent. Right carotid system: Patent without evidence of significant (greater than 50%) stenosis. Left carotid system: Patent without evidence of significant (greater than 50%) stenosis. Limited visualization of the origin. Vertebral arteries: Right dominant. The small/non dominant left vertebral artery poorly characterized proximally. Right vertebral artery is patent without evidence of hemodynamically significant stenosis. IMPRESSION: MRI: 1. No acute infarct. 2. Redemonstrated masslike thickening of the infundibulum. Recommend pituitary protocol MRI with contrast to further evaluate and exclude a lesion at this site, particularly given the patient's reported vision changes and proximity to the optic chiasm. 3. Mild chronic microvascular disease. MRA Head: No evidence of large vessel occlusion proximal hemodynamically significant stenosis. MRA neck: 1. Limited by venous timing with poor visualization of the non dominant left vertebral artery proximally. Stenosis is not excluded in this regions and CTA neck could further characterize if clinically indicated. 2. Otherwise, no visible hemodynamically  significant stenosis. Electronically Signed   By: Margaretha Sheffield MD   On: 03/27/2021 15:09   MR Angiogram Neck W or Wo Contrast  Result Date: 03/27/2021 CLINICAL DATA:  Neuro deficit, acute stroke suspected possible vision changes. EXAM: MRI HEAD WITHOUT  CONTRAST MRA HEAD WITHOUT CONTRAST MRA NECK WITHOUT AND WITH CONTRAST TECHNIQUE: Multiplanar, multi-echo pulse sequences of the brain and surrounding structures were acquired without intravenous contrast. Angiographic images of the Circle of Willis were acquired using MRA technique without intravenous contrast. Angiographic images of the neck were acquired using MRA technique without and with intravenous contrast. Carotid stenosis measurements (when applicable) are obtained utilizing NASCET criteria, using the distal internal carotid diameter as the denominator. CONTRAST:  22mL GADAVIST GADOBUTROL 1 MMOL/ML IV SOLN COMPARISON:  Same day CT head. FINDINGS: MRI HEAD FINDINGS Intermittently motion limited exam. Brain: No acute infarction, hemorrhage, or hydrocephalus. Mild scattered T2/FLAIR hyperintensities within the white matter, nonspecific but most likely related to chronic microvascular ischemic disease. Vascular: See below. Skull and upper cervical spine: Normal marrow signal. Sinuses/Orbits: Clear sinuses.  Unremarkable orbits. Other: No mastoid effusions. Nonspecific cyst in the left paramidline maxilla. MRA HEAD FINDINGS Mildly motion limited evaluation. Anterior circulation: No large vessel occlusion or proximal hemodynamically significant stenosis. No visible aneurysm. Early left MCA bifurcation. Posterior circulation: No large vessel occlusion or proximal hemodynamically significant stenosis. No visible aneurysm. MRA NECK FINDINGS Limited by venous contamination. Aortic arch: Great vessel origins are patent. Right carotid system: Patent without evidence of significant (greater than 50%) stenosis. Left carotid system: Patent without evidence of  significant (greater than 50%) stenosis. Limited visualization of the origin. Vertebral arteries: Right dominant. The small/non dominant left vertebral artery poorly characterized proximally. Right vertebral artery is patent without evidence of hemodynamically significant stenosis. IMPRESSION: MRI: 1. No acute infarct. 2. Redemonstrated masslike thickening of the infundibulum. Recommend pituitary protocol MRI with contrast to further evaluate and exclude a lesion at this site, particularly given the patient's reported vision changes and proximity to the optic chiasm. 3. Mild chronic microvascular disease. MRA Head: No evidence of large vessel occlusion proximal hemodynamically significant stenosis. MRA neck: 1. Limited by venous timing with poor visualization of the non dominant left vertebral artery proximally. Stenosis is not excluded in this regions and CTA neck could further characterize if clinically indicated. 2. Otherwise, no visible hemodynamically significant stenosis. Electronically Signed   By: Margaretha Sheffield MD   On: 03/27/2021 15:09   MR BRAIN WO CONTRAST  Result Date: 03/27/2021 CLINICAL DATA:  Neuro deficit, acute stroke suspected possible vision changes. EXAM: MRI HEAD WITHOUT CONTRAST MRA HEAD WITHOUT CONTRAST MRA NECK WITHOUT AND WITH CONTRAST TECHNIQUE: Multiplanar, multi-echo pulse sequences of the brain and surrounding structures were acquired without intravenous contrast. Angiographic images of the Circle of Willis were acquired using MRA technique without intravenous contrast. Angiographic images of the neck were acquired using MRA technique without and with intravenous contrast. Carotid stenosis measurements (when applicable) are obtained utilizing NASCET criteria, using the distal internal carotid diameter as the denominator. CONTRAST:  89mL GADAVIST GADOBUTROL 1 MMOL/ML IV SOLN COMPARISON:  Same day CT head. FINDINGS: MRI HEAD FINDINGS Intermittently motion limited exam. Brain: No  acute infarction, hemorrhage, or hydrocephalus. Mild scattered T2/FLAIR hyperintensities within the white matter, nonspecific but most likely related to chronic microvascular ischemic disease. Vascular: See below. Skull and upper cervical spine: Normal marrow signal. Sinuses/Orbits: Clear sinuses.  Unremarkable orbits. Other: No mastoid effusions. Nonspecific cyst in the left paramidline maxilla. MRA HEAD FINDINGS Mildly motion limited evaluation. Anterior circulation: No large vessel occlusion or proximal hemodynamically significant stenosis. No visible aneurysm. Early left MCA bifurcation. Posterior circulation: No large vessel occlusion or proximal hemodynamically significant stenosis. No visible aneurysm. MRA NECK FINDINGS Limited by venous contamination. Aortic arch: Doristine Devoid  vessel origins are patent. Right carotid system: Patent without evidence of significant (greater than 50%) stenosis. Left carotid system: Patent without evidence of significant (greater than 50%) stenosis. Limited visualization of the origin. Vertebral arteries: Right dominant. The small/non dominant left vertebral artery poorly characterized proximally. Right vertebral artery is patent without evidence of hemodynamically significant stenosis. IMPRESSION: MRI: 1. No acute infarct. 2. Redemonstrated masslike thickening of the infundibulum. Recommend pituitary protocol MRI with contrast to further evaluate and exclude a lesion at this site, particularly given the patient's reported vision changes and proximity to the optic chiasm. 3. Mild chronic microvascular disease. MRA Head: No evidence of large vessel occlusion proximal hemodynamically significant stenosis. MRA neck: 1. Limited by venous timing with poor visualization of the non dominant left vertebral artery proximally. Stenosis is not excluded in this regions and CTA neck could further characterize if clinically indicated. 2. Otherwise, no visible hemodynamically significant stenosis.  Electronically Signed   By: Margaretha Sheffield MD   On: 03/27/2021 15:09   CT HEAD CODE STROKE WO CONTRAST  Result Date: 03/27/2021 CLINICAL DATA:  Code stroke. Neuro deficit, acute, stroke suspected. Left eye droop and feeling "off." "Eye floaters." EXAM: CT HEAD WITHOUT CONTRAST TECHNIQUE: Contiguous axial images were obtained from the base of the skull through the vertex without intravenous contrast. COMPARISON:  No pertinent prior exams available for comparison. FINDINGS: Brain: Cerebral volume is normal for age. 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 Mild patchy and ill-defined hypoattenuation within the cerebral white matter, nonspecific but compatible with chronic small vessel ischemic disease. There is no acute intracranial hemorrhage. No demarcated cortical infarct. No extra-axial fluid collection. No evidence of intracranial mass. No midline shift. Nonspecific thickening of the pituitary stalk, measuring 6 mm in width. Vascular: No hyperdense vessel.  Atherosclerotic calcifications. Skull: Normal. Negative for fracture or focal lesion. Sinuses/Orbits: Visualized orbits show no acute finding. Trace mucosal thickening within the partially imaged right maxillary sinus. ASPECTS ([place] Stroke Program Early CT Score) - Ganglionic level infarction (caudate, lentiform nuclei, internal capsule, insula, M1-M3 cortex): 7 - Supraganglionic infarction (M4-M6 cortex): 3 Total score (0-10 with 10 being normal): 10 These results were communicated to Dr. Quinn Axe At 12:22 pmon 6/20/2022by text page via the [facility] messaging system. IMPRESSION: No evidence of acute cortical infarct or acute intracranial hemorrhage. 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 Nonspecific thickening of the pituitary stalk, measuring 6 mm in width. A contrast enhanced brain MRI with pituitary protocol is recommended for further characterization, and to exclude a lesion at this site.  Mild cerebral white matter chronic small vessel ischemic disease. Electronically Signed   By: Kellie Simmering DO   On: 03/27/2021 12:22    Procedures Procedures   Medications Ordered in ED Medications  sodium chloride 0.9 % bolus 500 mL (0 mLs Intravenous Stopped 03/27/21 1305)    Followed by  0.9 %  sodium chloride infusion (100 mL/hr Intravenous New Bag/Given 03/27/21 1212)  gadobutrol (GADAVIST) 1 MMOL/ML injection 7 mL (7 mLs Intravenous Contrast Given 03/27/21 1415)    ED Course  I have reviewed the triage vital signs and the nursing notes.  Pertinent labs & imaging results that were available during my care of the patient were reviewed by me and considered in my medical decision making (see chart for details).    MDM Rules/Calculators/A&P  Code stroke called on pt.  Pt d/w Dr. Quinn Axe (neurology).  Pt does not meet criteria for tpa due to only abnormality is ptosis.    She recommended MRI brain and MRI head/neck.  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  I spoke with MRI.  They can't do it for 24 hrs because he already received contrast.  Pt offered admission, but wants to go home and get the MRI done tomorrow.  Pt knows to return if worse.    CRITICAL CARE Performed by: Isla Pence   Total critical care time: 30 minutes  Critical care time was exclusive of separately billable procedures and treating other patients.  Critical care was necessary to treat or prevent imminent or life-threatening deterioration.  Critical care was time spent personally by me on the following activities: development of treatment plan with patient and/or surrogate as well as nursing, discussions with consultants, evaluation of patient's response to treatment, examination of patient, obtaining history from patient or surrogate, ordering and performing treatments and interventions, ordering and review of laboratory  studies, ordering and review of radiographic studies, pulse oximetry and re-evaluation of patient's condition.  Final Clinical Impression(s) / ED Diagnoses Final diagnoses:  Ptosis of left eyelid    Rx / DC Orders ED Discharge Orders          Ordered    MR BRAIN W WO CONTRAST        03/27/21 1537             Isla Pence, MD 03/27/21 1542

## 2021-03-28 ENCOUNTER — Ambulatory Visit (HOSPITAL_COMMUNITY)
Admission: RE | Admit: 2021-03-28 | Discharge: 2021-03-28 | Disposition: A | Discharge status: Home / Self Care | Payer: 59 | Source: Ambulatory Visit | Attending: Emergency Medicine | Admitting: Emergency Medicine

## 2021-03-28 DIAGNOSIS — G9389 Other specified disorders of brain: Secondary | ICD-10-CM | POA: Diagnosis not present

## 2021-03-28 DIAGNOSIS — E236 Other disorders of pituitary gland: Secondary | ICD-10-CM | POA: Insufficient documentation

## 2021-03-28 IMAGING — MR MR HEAD WO/W CM
10 of 11 series · 22 of 48 positions shown · IV contrast (gadavist)
Comparison: MRI [DATE].
COMPARISON: MRI [DATE].
COMPARISON: MRI [DATE].

Addendum:
CLINICAL DATA: Pituitary mass follow-up.

EXAM:
MRI HEAD WITHOUT AND WITH CONTRAST
TECHNIQUE: Multiplanar, multiecho pulse sequences of the brain and surrounding
structures were obtained without and with intravenous contrast.
CONTRAST:  5mL GADAVIST GADOBUTROL 1 MMOL/ML IV SOLN

[Series 5: T1 · sagittal · 3.0mm · 0.25mm/px · 1 of 12 slices shown (1 of 3)]
[im 1/12]
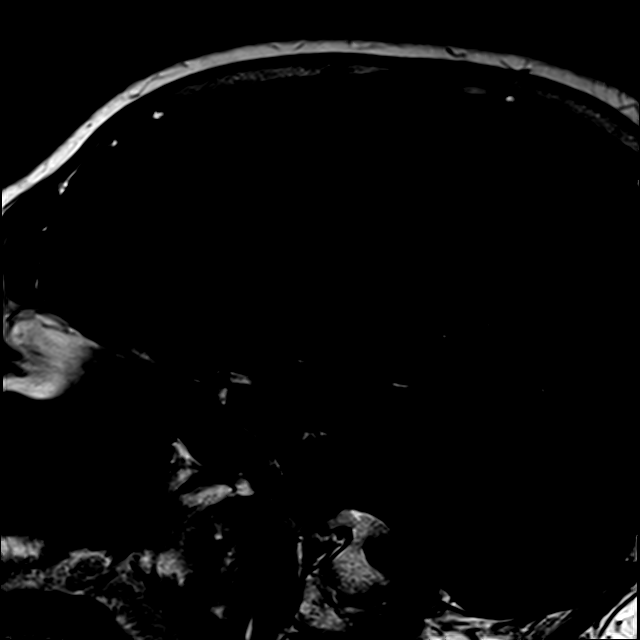

[Series 6: T2 · axial · 5.0mm · 0.72mm/px · z∈[-57,+93]mm · 2 of 23 slices shown (1 of 2)]
[im 1/23]
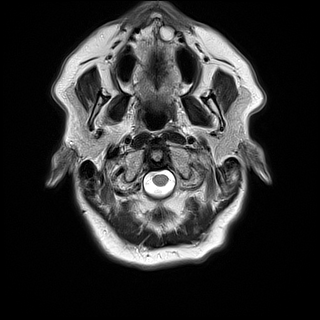
[im 23/23]
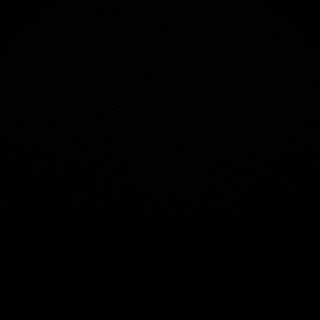

[Series 7: T1 · coronal · 3.0mm · 0.25mm/px · 1 of 13 slices shown (2 of 3)]
[im 1/13]
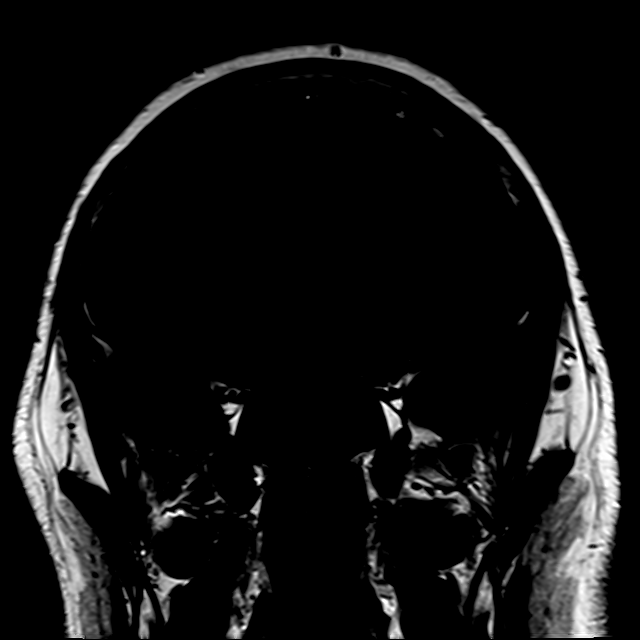

[Series 8: T2 · coronal · 3.0mm · 0.42mm/px · 1 of 13 slices shown (2 of 2)]
[im 1/13]
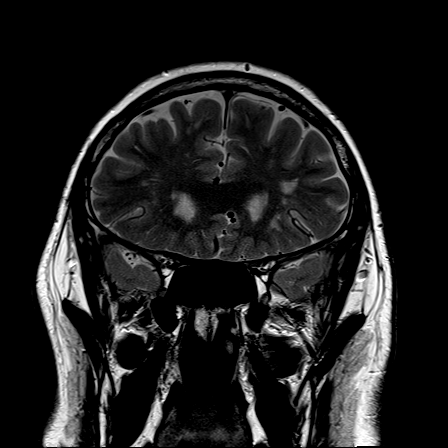

[Series 9: T1 · axial · 1.0mm · 0.98mm/px · z∈[-59,+82]mm · 8 of 167 slices shown (3 of 3)]
[im 11/167]
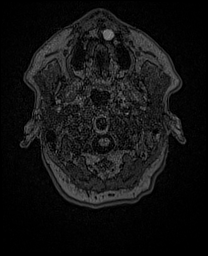
[im 32/167]
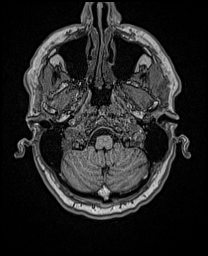
[im 52/167]
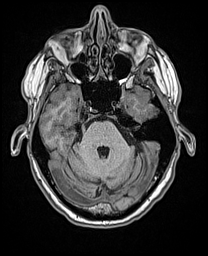
[im 73/167]
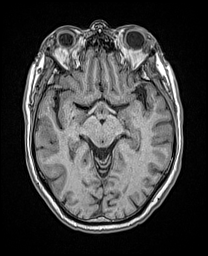
[im 94/167]
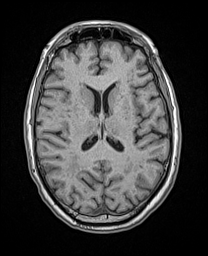
[im 115/167]
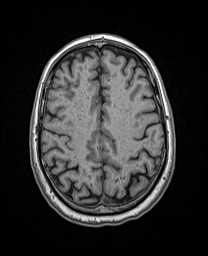
[im 135/167]
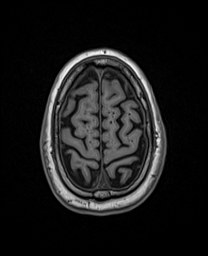
[im 156/167]
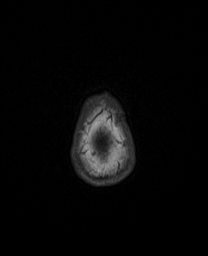

[Series 10: t1_tse_cor_dynamic pre · coronal · non-contrast · 3.0mm · 0.49mm/px · 1 of 5 slices shown]
[im 1/5]
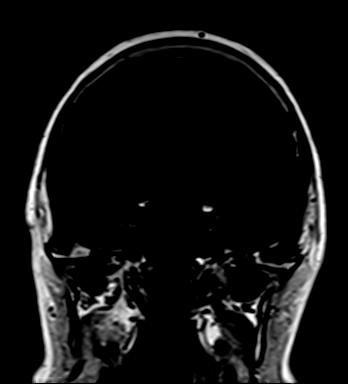

[Series 11: t1_tse_cor_dynamic post · coronal · 3.0mm · 0.49mm/px · 3 of 30 slices shown]
[im 1/30]
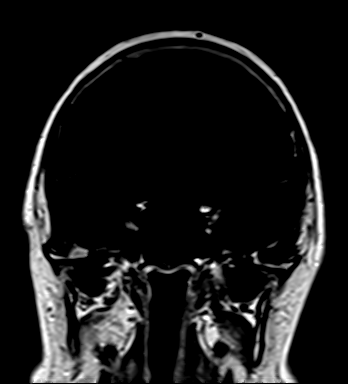
[im 15/30]
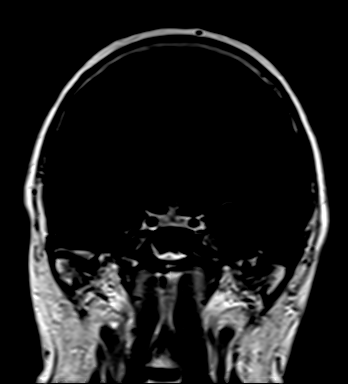
[im 30/30]
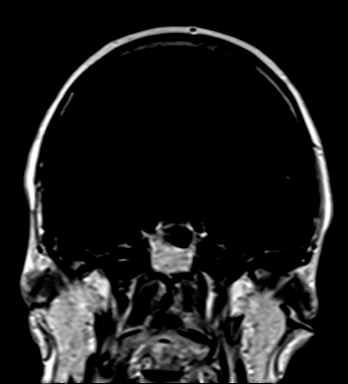

[Series 12: T1 post-contrast · sagittal · 3.0mm · 0.25mm/px · 1 of 12 slices shown (1 of 3)]
[im 1/12]
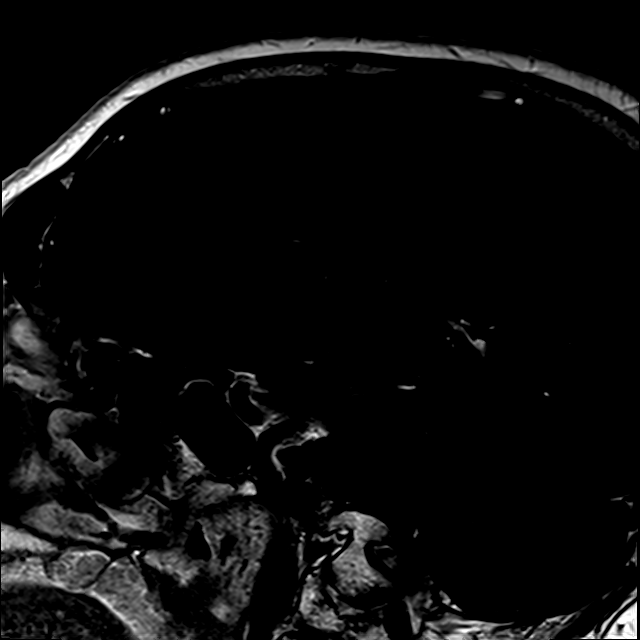

[Series 13: T1 post-contrast · coronal · 3.0mm · 0.25mm/px · 1 of 13 slices shown (2 of 3)]
[im 1/13]
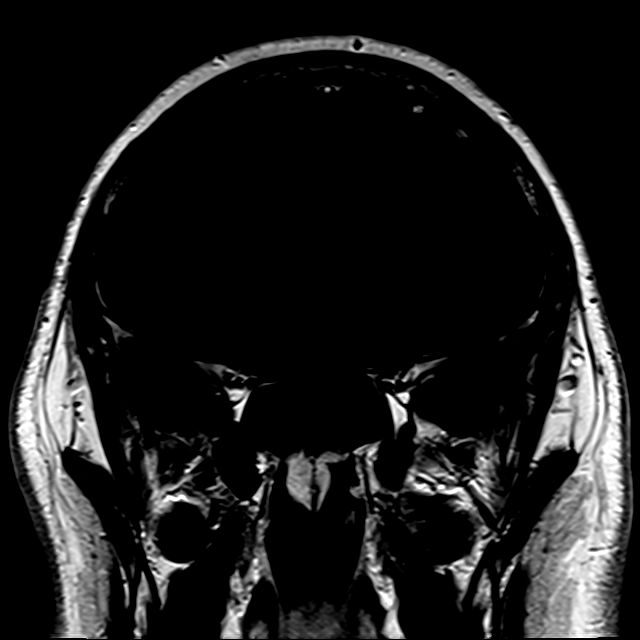

[Series 15: T1 post-contrast · coronal · 5.0mm · 0.34mm/px · 3 of 28 slices shown (3 of 3)]
[im 1/28]
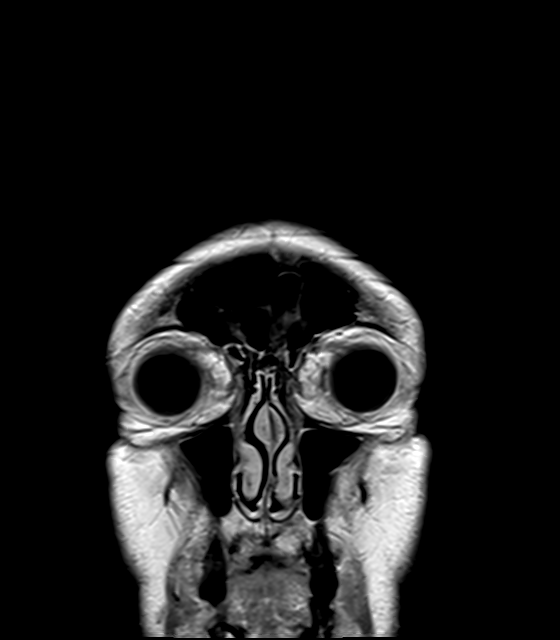
[im 14/28]
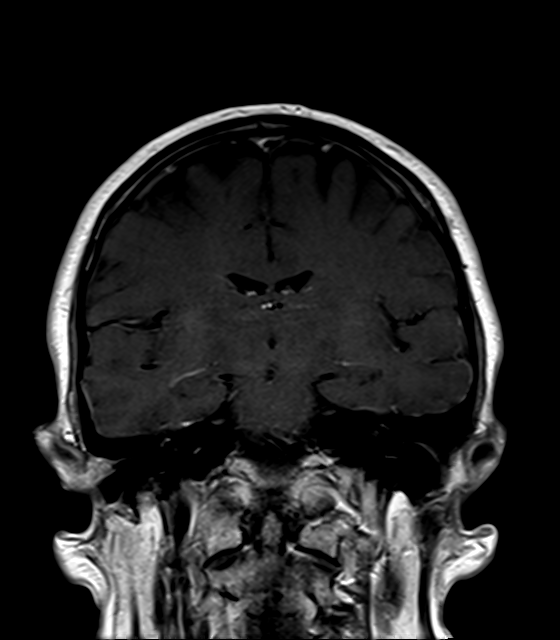
[im 28/28]
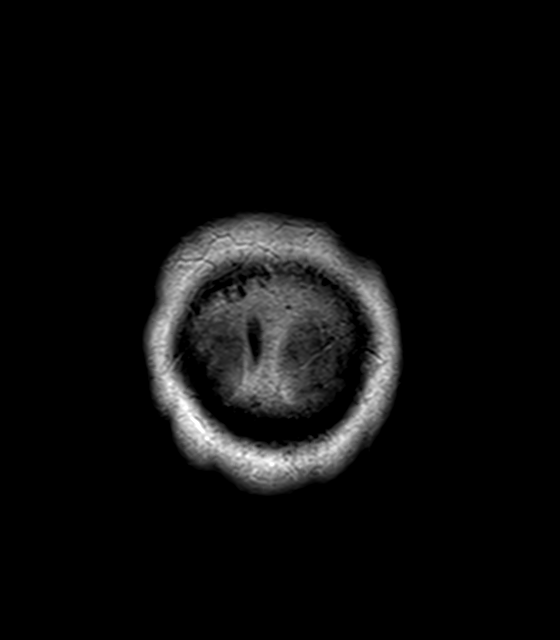

[22 of 48 positions shown; findings below may reference images not displayed]

FINDINGS: Dedicated pituitary protocol was performed to further evaluate the
potential pituitary mass seen on prior MRI and CT. In the pituitary,
there is abnormal masslike enlargement and mild enhancement of the
posterior pituitary and infundibulum which mildly diplaces the
normally enhancing pituitary anteriorly inferiorly. The infundibulum
and posterior pituitary is heterogeneously enhancing. The abnormally
thickened infundibulum contacts and mildly distorts the optic chiasm
(series 8, image 8). There are also small areas of patchy abnormal
enhancement along the inferior aspect of the hypothalamus (for
example series 12, images [DATE]).

On the whole brain postcontrast imaging there is a small (5 mm)
peripherally enhancing lesion in the right parietal lobe (series 14,
image 91) that is concerning for a metastasis. Punctate focus of
enhancement in the left cerebellum (series 14, image [DATE]
represent a small metastasis versus a vessel.
IMPRESSION: 1. On the whole brain postcontrast imaging there is a small (5 mm)
peripherally enhancing lesion in the right parietal lobe that is
concerning for a metastasis. Punctate focus of enhancement in the
left cerebellum may represent a small metastasis versus a vessel.
Recommend evaluation for a primary malignancy (including
consideration for a CT chest, abdomen and pelvis).
2. In the pituitary, there is abnormal masslike
enlargement/thickening and mild enhancement of the posterior
pituitary and infundibulum with small areas of abnormal enhancement
in the hypothalamus. The abnormally thickened infundibulum contacts
and mildly distorts the optic chiasm. A pituitary metastasis is a
consideration given the possible intraparenchymal brain metastasis
described above. Differential also includes inflammatory etiologies
(such as IgG4, sarcoidosis, or hypophysitis) and other neoplasms
(such as glioma or germinoma).

ADDENDUM:
Findings discussed with Dr. KARLOS ADRIAN via telephone at [DATE] p.m.

ADDENDUM:
Findings also discussed with [REDACTED] via telephone at [DATE] p.m.

*** End of Addendum ***
Addendum:
FINDINGS: Dedicated pituitary protocol was performed to further evaluate the
potential pituitary mass seen on prior MRI and CT. In the pituitary,
there is abnormal masslike enlargement and mild enhancement of the
posterior pituitary and infundibulum which mildly diplaces the
normally enhancing pituitary anteriorly inferiorly. The infundibulum
and posterior pituitary is heterogeneously enhancing. The abnormally
thickened infundibulum contacts and mildly distorts the optic chiasm
(series 8, image 8). There are also small areas of patchy abnormal
enhancement along the inferior aspect of the hypothalamus (for
example series 12, images [DATE]).

On the whole brain postcontrast imaging there is a small (5 mm)
peripherally enhancing lesion in the right parietal lobe (series 14,
image 91) that is concerning for a metastasis. Punctate focus of
enhancement in the left cerebellum (series 14, image [DATE]
represent a small metastasis versus a vessel.
IMPRESSION: 1. On the whole brain postcontrast imaging there is a small (5 mm)
peripherally enhancing lesion in the right parietal lobe that is
concerning for a metastasis. Punctate focus of enhancement in the
left cerebellum may represent a small metastasis versus a vessel.
Recommend evaluation for a primary malignancy (including
consideration for a CT chest, abdomen and pelvis).
2. In the pituitary, there is abnormal masslike
enlargement/thickening and mild enhancement of the posterior
pituitary and infundibulum with small areas of abnormal enhancement
in the hypothalamus. The abnormally thickened infundibulum contacts
and mildly distorts the optic chiasm. A pituitary metastasis is a
consideration given the possible intraparenchymal brain metastasis
described above. Differential also includes inflammatory etiologies
(such as IgG4, sarcoidosis, or hypophysitis) and other neoplasms
(such as glioma or germinoma).

ADDENDUM:
Findings discussed with Dr. KARLOS ADRIAN via telephone at [DATE] p.m.

*** End of Addendum ***
FINDINGS: Dedicated pituitary protocol was performed to further evaluate the
potential pituitary mass seen on prior MRI and CT. In the pituitary,
there is abnormal masslike enlargement and mild enhancement of the
posterior pituitary and infundibulum which mildly diplaces the
normally enhancing pituitary anteriorly inferiorly. The infundibulum
and posterior pituitary is heterogeneously enhancing. The abnormally
thickened infundibulum contacts and mildly distorts the optic chiasm
(series 8, image 8). There are also small areas of patchy abnormal
enhancement along the inferior aspect of the hypothalamus (for
example series 12, images [DATE]).

On the whole brain postcontrast imaging there is a small (5 mm)
peripherally enhancing lesion in the right parietal lobe (series 14,
image 91) that is concerning for a metastasis. Punctate focus of
enhancement in the left cerebellum (series 14, image [DATE]
represent a small metastasis versus a vessel.
IMPRESSION: 1. On the whole brain postcontrast imaging there is a small (5 mm)
peripherally enhancing lesion in the right parietal lobe that is
concerning for a metastasis. Punctate focus of enhancement in the
left cerebellum may represent a small metastasis versus a vessel.
Recommend evaluation for a primary malignancy (including
consideration for a CT chest, abdomen and pelvis).
2. In the pituitary, there is abnormal masslike
enlargement/thickening and mild enhancement of the posterior
pituitary and infundibulum with small areas of abnormal enhancement
in the hypothalamus. The abnormally thickened infundibulum contacts
and mildly distorts the optic chiasm. A pituitary metastasis is a
consideration given the possible intraparenchymal brain metastasis
described above. Differential also includes inflammatory etiologies
(such as IgG4, sarcoidosis, or hypophysitis) and other neoplasms
(such as glioma or germinoma).

## 2021-03-28 MED ORDER — GADOBUTROL 1 MMOL/ML IV SOLN
5.0000 mL | Freq: Once | INTRAVENOUS | Status: AC | PRN
  Administered 2021-03-28: 5 mL via INTRAVENOUS

## 2021-03-28 NOTE — ED Provider Notes (Signed)
Radiology called with abnormal MRI results on [facility].  I reviewed medical records, reason for being seen by emergency doctor and neurology.  MRI results were reviewed with radiology and I called [facility] to discuss importance of follow-up with primary doctor for abnormalities which may include cancer and other diagnosis.  Patient understands and will call primary doctor.  Golda Acre, MD 03/28/21 [phone]

## 2021-03-29 ENCOUNTER — Telehealth: Payer: Self-pay | Admitting: Neurology

## 2021-03-29 ENCOUNTER — Other Ambulatory Visit: Payer: Self-pay

## 2021-03-29 ENCOUNTER — Emergency Department (HOSPITAL_COMMUNITY)
Admission: EM | Admit: 2021-03-29 | Discharge: 2021-03-29 | Disposition: A | Discharge status: Home / Self Care | Payer: 59 | Attending: Emergency Medicine | Admitting: Emergency Medicine

## 2021-03-29 ENCOUNTER — Encounter (HOSPITAL_COMMUNITY): Payer: Self-pay | Admitting: Emergency Medicine

## 2021-03-29 ENCOUNTER — Emergency Department (HOSPITAL_COMMUNITY): Payer: 59

## 2021-03-29 DIAGNOSIS — C799 Secondary malignant neoplasm of unspecified site: Secondary | ICD-10-CM | POA: Diagnosis not present

## 2021-03-29 DIAGNOSIS — R911 Solitary pulmonary nodule: Secondary | ICD-10-CM | POA: Diagnosis not present

## 2021-03-29 DIAGNOSIS — F17210 Nicotine dependence, cigarettes, uncomplicated: Secondary | ICD-10-CM | POA: Diagnosis not present

## 2021-03-29 DIAGNOSIS — R29818 Other symptoms and signs involving the nervous system: Secondary | ICD-10-CM | POA: Diagnosis not present

## 2021-03-29 DIAGNOSIS — Z711 Person with feared health complaint in whom no diagnosis is made: Secondary | ICD-10-CM | POA: Insufficient documentation

## 2021-03-29 DIAGNOSIS — E119 Type 2 diabetes mellitus without complications: Secondary | ICD-10-CM | POA: Insufficient documentation

## 2021-03-29 DIAGNOSIS — Z7982 Long term (current) use of aspirin: Secondary | ICD-10-CM | POA: Diagnosis not present

## 2021-03-29 DIAGNOSIS — N200 Calculus of kidney: Secondary | ICD-10-CM | POA: Diagnosis not present

## 2021-03-29 DIAGNOSIS — N400 Enlarged prostate without lower urinary tract symptoms: Secondary | ICD-10-CM | POA: Diagnosis not present

## 2021-03-29 DIAGNOSIS — Z79899 Other long term (current) drug therapy: Secondary | ICD-10-CM | POA: Diagnosis not present

## 2021-03-29 DIAGNOSIS — N433 Hydrocele, unspecified: Secondary | ICD-10-CM | POA: Diagnosis not present

## 2021-03-29 DIAGNOSIS — I313 Pericardial effusion (noninflammatory): Secondary | ICD-10-CM | POA: Diagnosis not present

## 2021-03-29 DIAGNOSIS — K5730 Diverticulosis of large intestine without perforation or abscess without bleeding: Secondary | ICD-10-CM | POA: Diagnosis not present

## 2021-03-29 DIAGNOSIS — C3490 Malignant neoplasm of unspecified part of unspecified bronchus or lung: Secondary | ICD-10-CM | POA: Diagnosis not present

## 2021-03-29 DIAGNOSIS — I10 Essential (primary) hypertension: Secondary | ICD-10-CM | POA: Diagnosis not present

## 2021-03-29 DIAGNOSIS — I2510 Atherosclerotic heart disease of native coronary artery without angina pectoris: Secondary | ICD-10-CM | POA: Diagnosis not present

## 2021-03-29 LAB — I-STAT CHEM 8, ED
BUN: 8 mg/dL (ref 8–23)
Calcium, Ion: 1.22 mmol/L (ref 1.15–1.40)
Chloride: 108 mmol/L (ref 98–111)
Creatinine, Ser: 1 mg/dL (ref 0.61–1.24)
Glucose, Bld: 96 mg/dL (ref 70–99)
HCT: 47 % (ref 39.0–52.0)
Hemoglobin: 16 g/dL (ref 13.0–17.0)
Potassium: 4 mmol/L (ref 3.5–5.1)
Sodium: 145 mmol/L (ref 135–145)
TCO2: 26 mmol/L (ref 22–32)

## 2021-03-29 IMAGING — CT CT CHEST-ABD-PELV W/ CM
2 of 5 series · 12 of 36 positions shown, 14 images · IV contrast (omnipaque)
Comparison: Chest CT dated [DATE].

CLINICAL DATA: 64-year-old male with neurologic deficit. Concern
for malignancy.

EXAM:
CT CHEST, ABDOMEN, AND PELVIS WITH CONTRAST
TECHNIQUE: Multidetector CT imaging of the chest, abdomen and pelvis was
performed following the standard protocol during bolus
administration of intravenous contrast.
CONTRAST:  100mL OMNIPAQUE IOHEXOL 300 MG/ML  SOLN

[Series 3: cap 5.0 i31f 2 · axial · 0.87mm/px · z∈[+817,+1377]mm · 9 of 140 slices shown, 11 images]
[im 14/140  mediastinal]
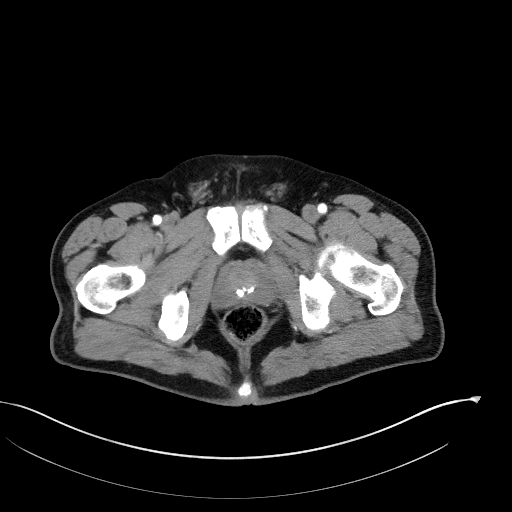
[im 14/140  bone]
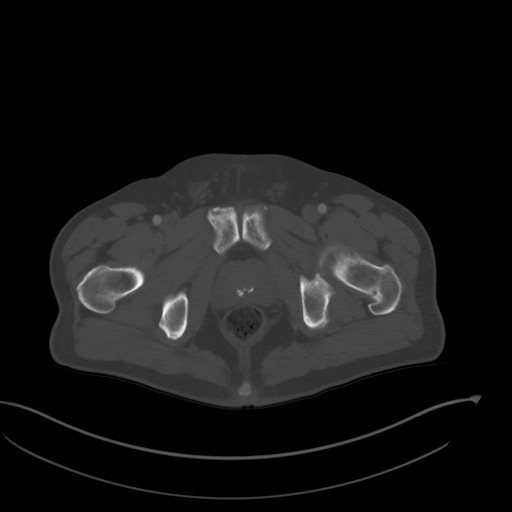
[im 28/140  mediastinal]
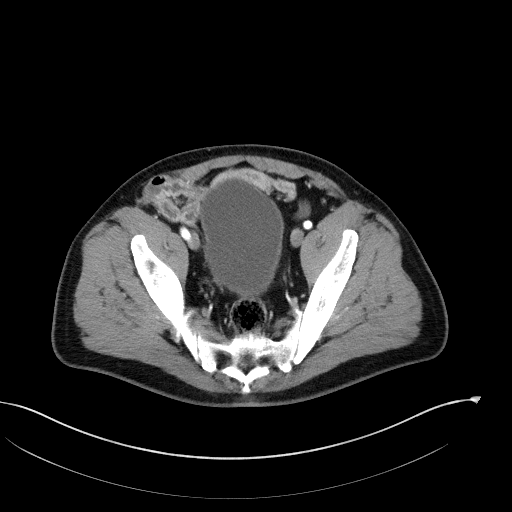
[im 42/140  mediastinal]
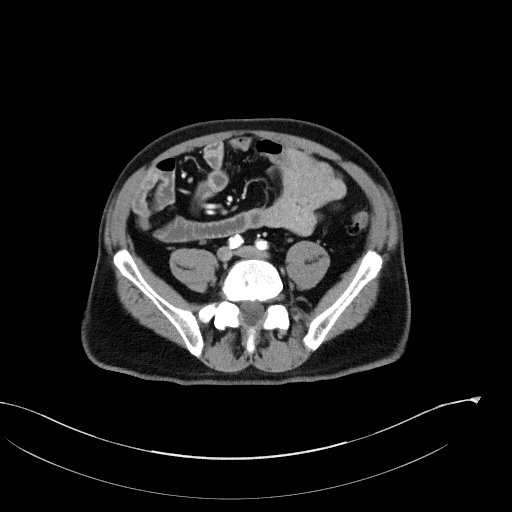
[im 56/140  mediastinal]
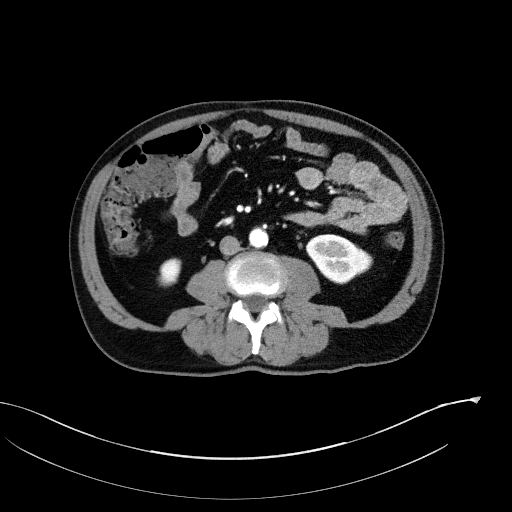
[im 70/140  mediastinal]
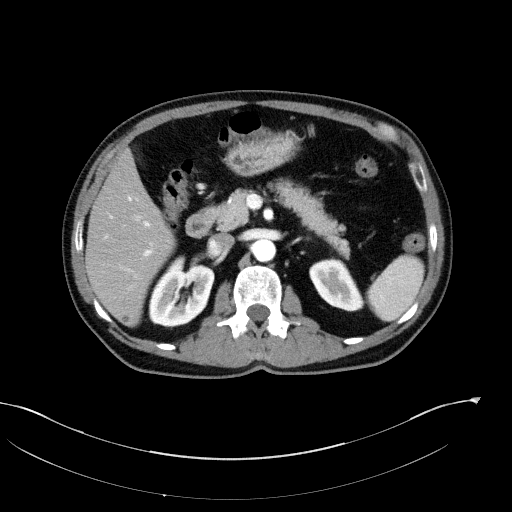
[im 84/140  mediastinal]
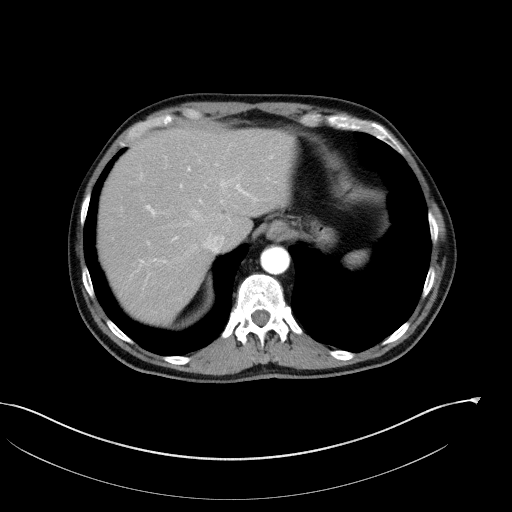
[im 98/140  mediastinal]
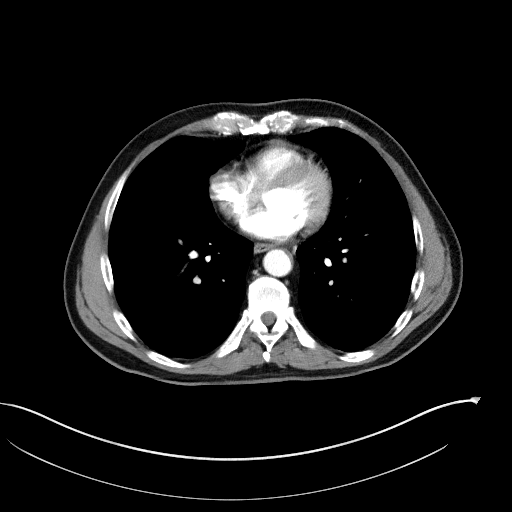
[im 112/140  mediastinal]
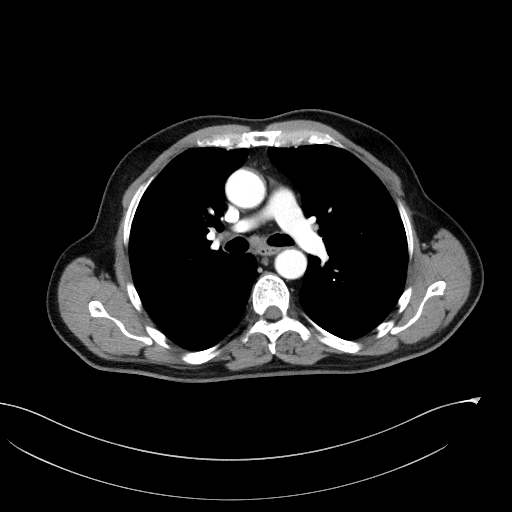
[im 126/140  mediastinal]
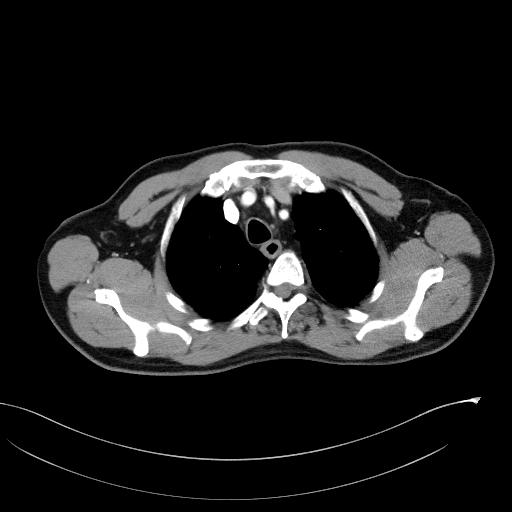
[im 126/140  bone]
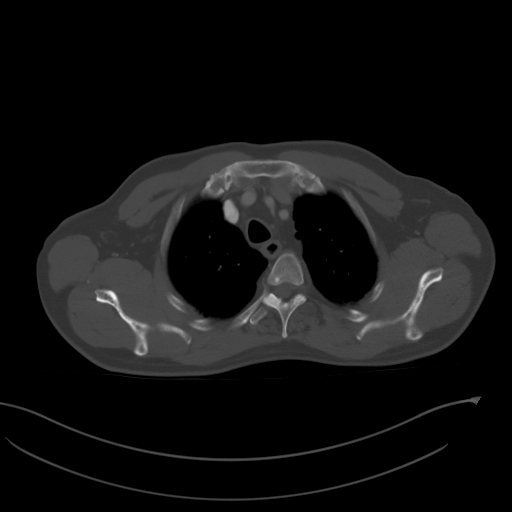

[Series 6: coronal · coronal · 0.87mm/px · 3 of 149 slices shown]
[im 30/149  mediastinal]
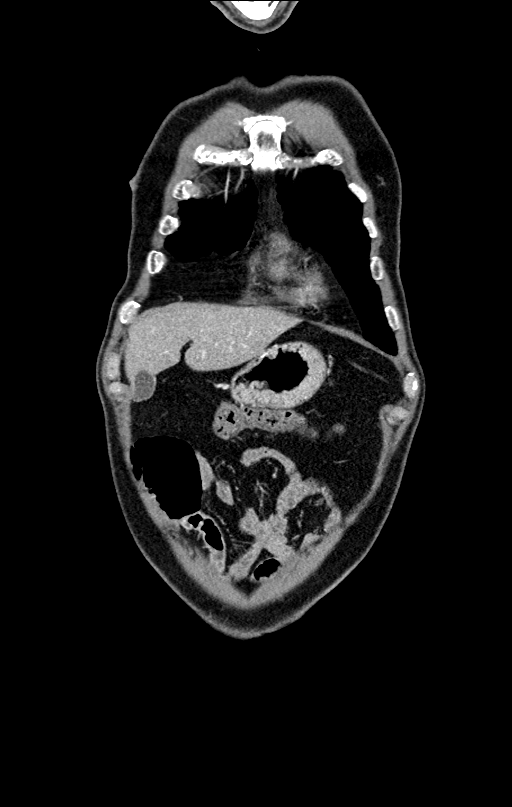
[im 60/149  mediastinal]
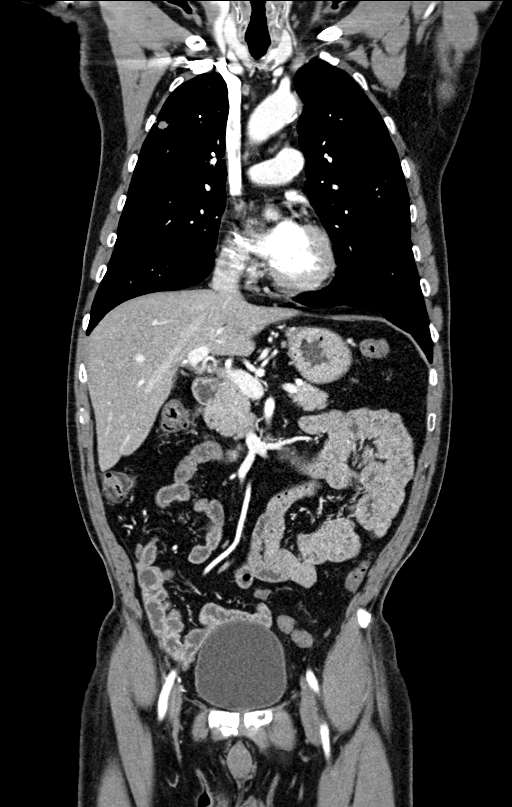
[im 89/149  mediastinal]
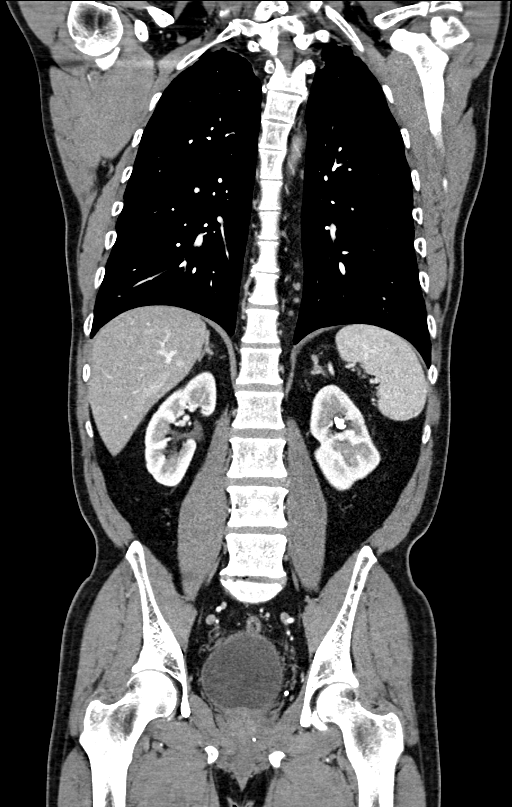

[12 of 36 positions shown; findings below may reference images not displayed]

FINDINGS: CT CHEST FINDINGS

Cardiovascular: There is no cardiomegaly. Trace pericardial effusion
anterior to the heart. Mild atherosclerotic calcification of the
thoracic aorta. No aneurysmal dilatation or dissection. The origins
of the great vessels of the aortic arch appear patent as visualized.
The central pulmonary arteries are unremarkable.

Mediastinum/Nodes: No hilar adenopathy. Subcarinal adenopathy
measures 2 cm in short axis, new since the prior CT. The esophagus
is grossly unremarkable. There is an 8 mm left thyroid hypodense
nodule similar to prior CT. Not clinically significant; no follow-up
imaging recommended (ref: [HOSPITAL]. [DATE]):
143-50).No mediastinal fluid collection.

Lungs/Pleura: There is a 12 mm nodule with lobulated margin in the
right upper lobe most consistent with malignancy. Several smaller
scattered nodules noted in the right upper lobe likely representing
tiny satellite lesions. There is an 8 mm ground-glass nodule in the
lingula (86/5) previously measuring approximately 5 mm. No lobar
consolidation, pleural effusion, or pneumothorax. The central
airways are patent.

Musculoskeletal: No acute osseous pathology. A small sclerotic focus
in the posterior left ninth rib, likely a bone island.

CT ABDOMEN PELVIS FINDINGS

No intra-abdominal free air or free fluid.

Hepatobiliary: 2 subcentimeter hypodense lesions in the posterior
right lobe of the liver are too small to characterize. The liver is
otherwise unremarkable. No intrahepatic biliary dilatation. The
gallbladder is unremarkable.

Pancreas: Unremarkable. No pancreatic ductal dilatation or
surrounding inflammatory changes.

Spleen: Normal in size without focal abnormality.

Adrenals/Urinary Tract: The adrenal glands are unremarkable. There
is an 11 mm nonobstructing stone in the upper pole of the left
kidney. No hydronephrosis. The right kidney is unremarkable. There
is symmetric enhancement and excretion of contrast by both kidneys.
The visualized ureters and urinary bladder appear unremarkable.

Stomach/Bowel: Several small scattered sigmoid diverticula without
active inflammatory changes. There is no bowel obstruction or active
inflammation. The appendix is normal.

Vascular/Lymphatic: Moderate aortoiliac atherosclerotic disease. The
IVC is unremarkable. No portal venous gas. There is no adenopathy.

Reproductive: Mildly enlarged prostate gland measuring 5.3 cm in
transverse axial diameter. The seminal vesicles are symmetric.
Partially visualized small right hydrocele. Inguinal hernia repair
plug.

Other: None

Musculoskeletal: Fragmentation of the anterior right symphysis
previous. No acute osseous pathology. No suspicious bone lesions.
Degenerative changes of the lower lumbar spine.
IMPRESSION: 1. A 12 mm right upper lobe pulmonary nodule most consistent with
malignancy. Multiple additional small nodular density in the right
upper lobe, likely satellite lesions. Multidisciplinary consult
including further evaluation with PET-CT recommended.
2. Subcarinal adenopathy, new since the prior CT, most consistent
with metastatic disease.
3. A 8 mm ground-glass nodule in the lingula previously measuring
approximately 5 mm.
4. No evidence of metastatic disease in the abdomen or pelvis.
5. A 11 mm nonobstructing left renal upper pole stone. No
hydronephrosis.
6. Sigmoid diverticulosis. No bowel obstruction. Normal appendix.
7. Aortic Atherosclerosis ([J5]-[J5]).

## 2021-03-29 MED ORDER — IOHEXOL 300 MG/ML  SOLN
100.0000 mL | Freq: Once | INTRAMUSCULAR | Status: AC | PRN
  Administered 2021-03-29: 100 mL via INTRAVENOUS

## 2021-03-29 NOTE — ED Provider Notes (Signed)
[facility] EMERGENCY DEPARTMENT Provider Note   CSN: [id number] Arrival date & time: 03/29/21  1347     History No chief complaint on file.   Bradley Goodman is a 65 y.o. male.  HPI Presents for evaluation of transient neurologic deficit 2 days ago, and abnormal subsequent work-up indicating possible metastatic intracranial process.  He states that the left eye ptosis and vision loss, which she had 2 days ago has entirely resolved, and that it was almost gone, at the time of the initial evaluation.  He has not had any recurrence of the symptom.  He states that he has previously had a lung nodule that is being followed up with regular CAT scans.  Last CT scan was in 2018.  He denies fever, chills, vomiting or dizziness.  He is here with his wife.  He has not had to miss any work.      Past Medical History:  Diagnosis Date   Cervical spondylolysis    Essential hypertension    GERD (gastroesophageal reflux disease)    History of kidney stones    History of migraine    Hyperlipidemia    PONV (postoperative nausea and vomiting)    Type 2 diabetes mellitus ([place])     Patient Active Problem List   Diagnosis Date Noted   Tobacco use 07/14/2020   Mixed hyperlipidemia 11/09/2019   Vitamin D deficiency 07/08/2019   Depression, recurrent ([place]) 07/08/2019   History of colonic polyps    History of adenomatous polyp of colon 09/14/2015   GERD (gastroesophageal reflux disease) 06/24/2013   Prediabetes 06/24/2013   Essential hypertension 06/24/2013    Past Surgical History:  Procedure Laterality Date   Bilateral inguinal hernia repair     COLONOSCOPY  01/23/2012   Procedure: COLONOSCOPY;  Surgeon: Daneil Dolin, MD;  [facility];  Service: Endoscopy;  Laterality: N/A;  9:30 AM   COLONOSCOPY N/A 10/11/2015   Procedure: COLONOSCOPY;  Surgeon: Daneil Dolin, MD;  Location: [facility];  Service: Endoscopy;  Laterality: N/A;  830   COLONOSCOPY N/A  10/14/2019   Procedure: COLONOSCOPY;  Surgeon: Daneil Dolin, MD;  Location: [facility];  Service: Endoscopy;  Laterality: N/A;  1:45   POLYPECTOMY  10/14/2019   Procedure: POLYPECTOMY;  Surgeon: Daneil Dolin, MD;  Location: [facility];  Service: Endoscopy;;  ascending colon, descending colon       Family History  Problem Relation Age of Onset   Heart attack Mother    Heart attack Father    Heart attack Brother    Colon cancer Neg Hx     Social History   Tobacco Use   Smoking status: Every Day    Packs/day: 1.00    Years: 30.00    Pack years: 30.00    Types: Cigarettes    Start date: 10/30/1973   Smokeless tobacco: Never  Vaping Use   Vaping Use: Never used  Substance Use Topics   Alcohol use: Yes    Alcohol/week: 0.0 standard drinks    Comment: One drink every 6 months.   Drug use: No    Home Medications Prior to Admission medications   Medication Sig Start Date End Date Taking? Authorizing Provider  aspirin EC 81 MG tablet Take 81 mg by mouth every evening.    [provider]  cholecalciferol (VITAMIN D) 1000 UNITS tablet Take 2,000 Units by mouth every evening. Pt takes two tablets daily    [provider]  Krill Oil 300 MG CAPS Take 1 capsule by mouth every evening.    [provider]  lidocaine (XYLOCAINE) 2 % solution Use as directed 15 mLs in the mouth or throat 3 (three) times daily as needed for mouth pain. Swish and spit 03/01/21   Brunetta Jeans, PA-C  loratadine (CLARITIN) 10 MG tablet Take 10 mg by mouth every evening.     [provider]  losartan (COZAAR) 50 MG tablet TAKE 1 AND 1/2 TABLETS BY MOUTH DAILY Patient taking differently: Take 75 mg by mouth daily. 01/10/21 01/10/22  [name], FNP  pantoprazole (PROTONIX) 40 MG tablet TAKE 1 TABLET BY MOUTH EVERY EVENING. Patient taking differently: Take 40 mg by mouth every evening. 03/09/20 03/29/21  Sharion Balloon, FNP  rosuvastatin (CRESTOR) 20 MG tablet Take 1  tablet (20 mg total) by mouth daily. 01/10/21   [name], FNP  venlafaxine XR (EFFEXOR-XR) 75 MG 24 hr capsule Take 1 capsule (75 mg total) by mouth daily with breakfast. 01/10/21   [name], FNP    Allergies    Patient has no known allergies.  Review of Systems   Review of Systems  All other systems reviewed and are negative.  Physical Exam Updated Vital Signs BP (!) 153/105 (BP Location: Right Arm)   Pulse 70   Temp 98.6 F (37 C) (Axillary)   Resp 15   SpO2 98%   Physical Exam Vitals and nursing note reviewed.  Constitutional:      General: He is not in acute distress.    Appearance: He is well-developed. He is not toxic-appearing or diaphoretic.  HENT:     Head: Normocephalic and atraumatic.     Right Ear: External ear normal.     Left Ear: External ear normal.  Eyes:     Conjunctiva/sclera: Conjunctivae normal.     Pupils: Pupils are equal, round, and reactive to light.  Neck:     Trachea: Phonation normal.  Cardiovascular:     Rate and Rhythm: Normal rate and regular rhythm.     Heart sounds: Normal heart sounds.  Pulmonary:     Effort: Pulmonary effort is normal.     Breath sounds: Normal breath sounds.  Abdominal:     General: There is no distension.  Musculoskeletal:        General: Normal range of motion.     Cervical back: Normal range of motion and neck supple.  Skin:    General: Skin is warm and dry.  Neurological:     Mental Status: He is alert and oriented to person, place, and time.     Cranial Nerves: No cranial nerve deficit.     Sensory: No sensory deficit.     Motor: No abnormal muscle tone.     Coordination: Coordination normal.     Comments: No dysarthria or aphasia.  No ataxia.  Psychiatric:        Mood and Affect: Mood normal.        Behavior: Behavior normal.        Thought Content: Thought content normal.        Judgment: Judgment normal.    ED Results / Procedures / Treatments   Labs (all labs ordered are listed,  but only abnormal results are displayed) Labs Reviewed  I-STAT CHEM 8, ED    EKG None  Radiology MR BRAIN W WO CONTRAST  Addendum Date: 03/29/2021   ADDENDUM REPORT: 03/29/2021 12:23 ADDENDUM: Findings  also discussed with Dr. Quinn Axe via telephone at 12:14 p.m. Electronically Signed   By: Margaretha Sheffield MD   On: 03/29/2021 12:23   Addendum Date: 03/28/2021   ADDENDUM REPORT: 03/28/2021 17:31 ADDENDUM: Findings discussed with Dr. Reather Converse via telephone at 5:04 p.m. Electronically Signed   By: Margaretha Sheffield MD   On: 03/28/2021 17:31   Result Date: 03/29/2021 CLINICAL DATA:  Pituitary mass follow-up. EXAM: MRI HEAD WITHOUT AND WITH CONTRAST TECHNIQUE: Multiplanar, multiecho pulse sequences of the brain and surrounding structures were obtained without and with intravenous contrast. CONTRAST:  34mL GADAVIST GADOBUTROL 1 MMOL/ML IV SOLN COMPARISON:  MRI March 27, 2021. FINDINGS: Dedicated pituitary protocol was performed to further evaluate the potential pituitary mass seen on prior MRI and CT. In the pituitary, there is abnormal masslike enlargement and mild enhancement of the posterior pituitary and infundibulum which mildly diplaces the normally enhancing pituitary anteriorly inferiorly. The infundibulum and posterior pituitary is heterogeneously enhancing. The abnormally thickened infundibulum contacts and mildly distorts the optic chiasm (series 8, image 8). There are also small areas of patchy abnormal enhancement along the inferior aspect of the hypothalamus (for example series 12, images 5/6). On the whole brain postcontrast imaging there is a small (5 mm) peripherally enhancing lesion in the right parietal lobe (series 14, image 91) that is concerning for a metastasis. Punctate focus of enhancement in the left cerebellum (series 14, image 47) may represent a small metastasis versus a vessel. IMPRESSION: 1. On the whole brain postcontrast imaging there is a small (5 mm) peripherally enhancing  lesion in the right parietal lobe that is concerning for a metastasis. Punctate focus of enhancement in the left cerebellum may represent a small metastasis versus a vessel. Recommend evaluation for a primary malignancy (including consideration for a CT chest, abdomen and pelvis). 2. In the pituitary, there is abnormal masslike enlargement/thickening and mild enhancement of the posterior pituitary and infundibulum with small areas of abnormal enhancement in the hypothalamus. The abnormally thickened infundibulum contacts and mildly distorts the optic chiasm. A pituitary metastasis is a consideration given the possible intraparenchymal brain metastasis described above. Differential also includes inflammatory etiologies (such as IgG4, sarcoidosis, or hypophysitis) and other neoplasms (such as glioma or germinoma). Electronically Signed: By: Margaretha Sheffield MD On: 03/28/2021 16:56   CT CHEST ABDOMEN PELVIS W CONTRAST  Result Date: 03/29/2021 CLINICAL DATA:  65 year old male with neurologic deficit. Concern for malignancy. EXAM: CT CHEST, ABDOMEN, AND PELVIS WITH CONTRAST TECHNIQUE: Multidetector CT imaging of the chest, abdomen and pelvis was performed following the standard protocol during bolus administration of intravenous contrast. CONTRAST:  170mL OMNIPAQUE IOHEXOL 300 MG/ML  SOLN COMPARISON:  Chest CT dated 01/17/2017. FINDINGS: CT CHEST FINDINGS Cardiovascular: There is no cardiomegaly. Trace pericardial effusion anterior to the heart. Mild atherosclerotic calcification of the thoracic aorta. No aneurysmal dilatation or dissection. The origins of the great vessels of the aortic arch appear patent as visualized. The central pulmonary arteries are unremarkable. Mediastinum/Nodes: No hilar adenopathy. Subcarinal adenopathy measures 2 cm in short axis, new since the prior CT. The esophagus is grossly unremarkable. There is an 8 mm left thyroid hypodense nodule similar to prior CT. Not clinically significant;  no follow-up imaging recommended (ref: J Am Coll Radiol. 2015 Feb;12(2): 143-50).No mediastinal fluid collection. Lungs/Pleura: There is a 12 mm nodule with lobulated margin in the right upper lobe most consistent with malignancy. Several smaller scattered nodules noted in the right upper lobe likely representing tiny satellite lesions. There is an 8  mm ground-glass nodule in the lingula (86/5) previously measuring approximately 5 mm. No lobar consolidation, pleural effusion, or pneumothorax. The central airways are patent. Musculoskeletal: No acute osseous pathology. A small sclerotic focus in the posterior left ninth rib, likely a bone island. CT ABDOMEN PELVIS FINDINGS No intra-abdominal free air or free fluid. Hepatobiliary: 2 subcentimeter hypodense lesions in the posterior right lobe of the liver are too small to characterize. The liver is otherwise unremarkable. No intrahepatic biliary dilatation. The gallbladder is unremarkable. Pancreas: Unremarkable. No pancreatic ductal dilatation or surrounding inflammatory changes. Spleen: Normal in size without focal abnormality. Adrenals/Urinary Tract: The adrenal glands are unremarkable. There is an 11 mm nonobstructing stone in the upper pole of the left kidney. No hydronephrosis. The right kidney is unremarkable. There is symmetric enhancement and excretion of contrast by both kidneys. The visualized ureters and urinary bladder appear unremarkable. Stomach/Bowel: Several small scattered sigmoid diverticula without active inflammatory changes. There is no bowel obstruction or active inflammation. The appendix is normal. Vascular/Lymphatic: Moderate aortoiliac atherosclerotic disease. The IVC is unremarkable. No portal venous gas. There is no adenopathy. Reproductive: Mildly enlarged prostate gland measuring 5.3 cm in transverse axial diameter. The seminal vesicles are symmetric. Partially visualized small right hydrocele. Inguinal hernia repair plug. Other: None  Musculoskeletal: Fragmentation of the anterior right symphysis previous. No acute osseous pathology. No suspicious bone lesions. Degenerative changes of the lower lumbar spine. IMPRESSION: 1. A 12 mm right upper lobe pulmonary nodule most consistent with malignancy. Multiple additional small nodular density in the right upper lobe, likely satellite lesions. Multidisciplinary consult including further evaluation with PET-CT recommended. 2. Subcarinal adenopathy, new since the prior CT, most consistent with metastatic disease. 3. A 8 mm ground-glass nodule in the lingula previously measuring approximately 5 mm. 4. No evidence of metastatic disease in the abdomen or pelvis. 5. A 11 mm nonobstructing left renal upper pole stone. No hydronephrosis. 6. Sigmoid diverticulosis. No bowel obstruction. Normal appendix. 7. Aortic Atherosclerosis (ICD10-I70.0). Electronically Signed   By: Anner Crete M.D.   On: 03/29/2021 17:04    Procedures Procedures   Medications Ordered in ED Medications  iohexol (OMNIPAQUE) 300 MG/ML solution 100 mL (100 mLs Intravenous Contrast Given 03/29/21 1645)    ED Course  I have reviewed the triage vital signs and the nursing notes.  Pertinent labs & imaging results that were available during my care of the patient were reviewed by me and considered in my medical decision making (see chart for details).    MDM Rules/Calculators/A&P                           Patient Vitals for the past 24 hrs:  BP Temp Temp src Pulse Resp SpO2  03/29/21 2018 -- 98.6 F (37 C) Axillary 70 15 98 %  03/29/21 1849 (!) 153/105 -- -- 93 15 100 %  03/29/21 1409 (!) 131/98 99.3 F (37.4 C) Oral (!) 113 18 99 %    At time of discharge- reevaluation with update and discussion. After initial assessment and treatment, an updated evaluation reveals no change in clinical status, findings discussed with patient and his wife, questions answered. Daleen Bo   Medical Decision Making:  This  patient is presenting for evaluation of abnormal brain MRI indicating metastatic disease, which does require a range of treatment options, and is a complaint that involves a moderate risk of morbidity and mortality. The differential diagnoses include metastatic cancer, nonspecific intracranial abnormality. I decided  to review old records, and in summary events middle-aged male presenting with abnormal MRI, as an outpatient.  This was apparently completed on 03/28/2021.  Today he talked to a neurologist who suggested he come here for further evaluation..  I obtained additional historical information from wife.  Clinical Laboratory Tests Ordered, included  i-STAT 8 . Review indicates normal.  Radiologic imaging ordered-CT chest abdomen pelvis.  Images reviewed.  Right upper lobe lung nodule, consistent with primary lung cancer and secondary satellite lesions with additional lesions in the thorax.  No intra-abdominal or pelvic disorder.   Critical Interventions-clinical evaluation, review of prior imaging studies.  Discussion with neurologist, Dr. Leonel Ramsay.  After These Interventions, the Patient was reevaluated and was found stable for discharge.  Patient has resolved symptoms which occurred 2 days ago.  He has not had seizures.  He is a smoker and has previously had a lung nodule.  Evaluation today is consistent with metastatic lung cancer, diffuse, with resolved neurologic symptoms.  Patient is close follow-up as an outpatient.  He does not require face-to-face neurology consultation or admission at this time.  CRITICAL CARE-no Performed by: Daleen Bo  Nursing Notes Reviewed/ Care Coordinated Applicable Imaging Reviewed Interpretation of Laboratory Data incorporated into ED treatment  The patient appears reasonably screened and/or stabilized for discharge and I doubt any other medical condition or other [facility] requiring further screening, evaluation, or treatment in the ED at this time prior  to discharge.  Plan: Home Medications-continue usual; Home Treatments-regular diet and activity; return here if the recommended treatment, does not improve the symptoms; Recommended follow up-oncology follow-up as soon as possible.     Final Clinical Impression(s) / ED Diagnoses Final diagnoses:  Concern about cancer without diagnosis  Lung nodule  Metastatic malignant neoplasm, unspecified site [facility])    Rx / DC Orders ED Discharge Orders     None        Daleen Bo, MD 03/29/21 2025

## 2021-03-29 NOTE — ED Provider Notes (Signed)
Emergency Medicine Provider Triage Evaluation Note  Bradley Goodman , a 65 y.o. male  was evaluated in triage.  Pt received phone call instructing him to come here for imaging.  In short, patient has been evaluated 03/27/2021 for left eye ptosis.  Dr. Quinn Axe, neurology, spoke with radiology today regarding abnormalities on MRI brain which demonstrated concern for metastasis.  Recommending CT chest, abdomen, and pelvis as well as neurology consult once he is in the ED.  Patient denies any symptoms at present.  Feels as though his eyelid ptosis has improved.  Review of Systems  Positive: Anxious about test results. Negative: Neurologic deficits, continued ptosis, fevers, any symptoms.   Physical Exam  BP (!) 131/98 (BP Location: Right Arm)   Pulse (!) 113   Temp 99.3 F (37.4 C) (Oral)   Resp 18   SpO2 99%  Gen:   Awake, no distress   Resp:  Normal effort  MSK:   Moves extremities without difficulty  Neuro  No ptosis   Medical Decision Making  Medically screening exam initiated at 2:56 PM.  Appropriate orders placed.  Bradley Goodman was informed that the remainder of the evaluation will be completed by another provider, this initial triage assessment does not replace that evaluation, and the importance of remaining in the ED until their evaluation is complete.  Ordered CT with contrast chest, abdomen, and pelvis.  Neuro to be consulted once roomed.   Corena Herter, PA-C 03/29/21 1505    Fredia Sorrow, MD 04/06/21 1352

## 2021-03-29 NOTE — Discharge Instructions (Addendum)
Call Dr. Renda Rolls office at the [facility], for follow-up appointment as soon as possible.  They will be able to initiate further evaluation and treatment.  If your condition worsens, develop a headache, seizures, or other concerning symptoms, follow-up immediately.

## 2021-03-29 NOTE — ED Triage Notes (Signed)
Pt here sent over by MD for ct scans after an abnormal Mri pt has no complaints at present

## 2021-03-29 NOTE — Telephone Encounter (Signed)
Neurology Note --> Patient Referred to [facility] ED from Home  I saw this patient as a telestroke consult as Bradley Goodman on 03/27/21 for acute L ptosis and transient L eye vision impairment. The L ptosis was the only remaining deficit when telestroke was called. MRI brain wo contrast demonstrated masslike thickening of the infundibulum and MRI brain wwo contrast was performed dedicated pituitary protocol to further evaluate this. The repeat MRI was apparently done as an outpatient.  I received a call today from Dr. Steele Sizer with radiology to notify me of the following abnormalities seen on the second MRI:  MRI brain wwo 03/28/21 1. On the whole brain postcontrast imaging there is a small (5 mm) peripherally enhancing lesion in the right parietal lobe that is concerning for a metastasis. Punctate focus of enhancement in the left cerebellum may represent a small metastasis versus a vessel. Recommend evaluation for a primary malignancy (including consideration for a CT chest, abdomen and pelvis). 2. In the pituitary, there is abnormal masslike enlargement/thickening and mild enhancement of the posterior pituitary and infundibulum with small areas of abnormal enhancement in the hypothalamus. The abnormally thickened infundibulum contacts and mildly distorts the optic chiasm. A pituitary metastasis is a consideration given the possible intraparenchymal brain metastasis described above. Differential also includes inflammatory etiologies (such as IgG4, sarcoidosis, or hypophysitis) and other neoplasms (such as glioma or germinoma).  I called the patient at home and asked him to go to the [facility] ED for further evaluation and notified ED triage to expect him. On arrival he will need CT c/a/p and neurology consultation for in-person examination (I only saw him on telestroke) and further recommendations.  Su Monks, MD [facility] [phone]  If 7pm- 7am, please page neurology on call  as listed in [place].

## 2021-03-30 ENCOUNTER — Ambulatory Visit: Payer: Self-pay | Admitting: Family Medicine

## 2021-03-30 ENCOUNTER — Other Ambulatory Visit: Payer: Self-pay | Admitting: *Deleted

## 2021-04-03 ENCOUNTER — Telehealth: Payer: Self-pay | Admitting: Pulmonary Disease

## 2021-04-03 ENCOUNTER — Other Ambulatory Visit: Payer: Self-pay | Admitting: Radiation Oncology

## 2021-04-03 ENCOUNTER — Other Ambulatory Visit (HOSPITAL_COMMUNITY)
Admission: RE | Admit: 2021-04-03 | Discharge: 2021-04-03 | Disposition: A | Discharge status: Home / Self Care | Payer: 59 | Source: Ambulatory Visit | Attending: Pulmonary Disease | Admitting: Pulmonary Disease

## 2021-04-03 DIAGNOSIS — Z20822 Contact with and (suspected) exposure to covid-19: Secondary | ICD-10-CM | POA: Diagnosis not present

## 2021-04-03 DIAGNOSIS — R599 Enlarged lymph nodes, unspecified: Secondary | ICD-10-CM

## 2021-04-03 DIAGNOSIS — R911 Solitary pulmonary nodule: Secondary | ICD-10-CM

## 2021-04-03 DIAGNOSIS — Z01812 Encounter for preprocedural laboratory examination: Secondary | ICD-10-CM | POA: Diagnosis not present

## 2021-04-03 LAB — SARS CORONAVIRUS 2 (TAT 6-24 HRS): SARS Coronavirus 2: NEGATIVE

## 2021-04-03 NOTE — Telephone Encounter (Signed)
Pt already scheduled for 6/29 at 7:30.  I have pt scheduled for covid test today at 11:30.  Spoke to pt & gave him appt info.

## 2021-04-03 NOTE — Telephone Encounter (Signed)
PCCM:  Received referral from Dr. Tammi Klippel regarding urgent need for tissue diagnosis.  MRI of the brain with and concern of metastasis.  Appears to have advanced stage malignancy.  CT imaging reviewed with subcarinal adenopathy.  Called spoke with patient discussed CT results and patient is agreeable to meet me day of procedure for tissue biopsy.  To expedite scheduling I have spoke with endoscopy.  I have agreed to try to work patient in on 04/05/2021 for 7:30 AM start time.  [facility] 04/03/2021 9:02 AM

## 2021-04-03 NOTE — Progress Notes (Signed)
  Radiation Oncology         [phone] ________________________________  Name: Bradley Goodman MRN: [id number]  Date: 04/03/2021  DOB: 1956-04-27  Telephone Consult:  Diagnosis:  65 yo man with likely stage T1 N2 M1a right upper lung cancer with brain metastases, pending diagnostic work-up  Narrative: Today, we discussed this patient in our brain tumor conference.  He presented to the emergency department last week on 2 occasions.  During the first, he had a brain MRI which apparently showed 2 brain metastases with a pituitary lesion and a right parietal lesion.  Subsequent chest CT during his second visit to the emergency department showed a right upper lung nodule measuring 12 mm suspicious for malignancy with mediastinal and subcarinal lymphadenopathy.  Patient has an excellent performance status and is essentially asymptomatic at this time.  Plan: I called the patient after our conference to follow-up on our findings.  I am going to place the following orders on his behalf to help expedite his work-up. PET/CT for solitary pulmonary nodule, suspected lung cancer Referral to Dr. Leory Plowman Icard from pulmonary to help expedite bronchoscopy with EBUS In addition, the patient is being referred to the multidisciplinary thoracic oncology clinic  ________________________________  Sheral Apley. Tammi Klippel, M.D.

## 2021-04-04 ENCOUNTER — Encounter (HOSPITAL_COMMUNITY): Payer: Self-pay | Admitting: Pulmonary Disease

## 2021-04-04 ENCOUNTER — Other Ambulatory Visit: Payer: Self-pay

## 2021-04-04 NOTE — Progress Notes (Signed)
[name] denies chest pain or shortness of breath. [name] was tested for Covid on 03/14/21.  [name] has type II diabetes, patient rarely checks CBG, Mrs. Bradley Goodman said that it will be checked in am. I instructed patient to check CBG after awaking and every 2 hours until arrival  to the hospital.  I Instructed patient if CBG is less than 70 to take 4 Glucose Tablets or 1 tube of Glucose Gel or 1/2 cup of a clear juice. Recheck CBG in 15 minutes if CBG is not over 70 call, pre- op desk at [phone] for further instructions.

## 2021-04-04 NOTE — Anesthesia Preprocedure Evaluation (Addendum)
Anesthesia Evaluation  Patient identified by MRN, date of birth, ID band Patient awake    Reviewed: Allergy & Precautions, NPO status , Patient's Chart, lab work & pertinent test results  History of Anesthesia Complications (+) PONV  Airway Mallampati: II  TM Distance: >3 FB Neck ROM: Full    Dental  (+) Partial Upper   Pulmonary Current Smoker and Patient abstained from smoking.,  Adenopathy    Pulmonary exam normal        Cardiovascular hypertension, Pt. on medications  Rhythm:Regular Rate:Normal     Neuro/Psych  Headaches, Depression    GI/Hepatic Neg liver ROS, GERD  Medicated,  Endo/Other  diabetes, Type 2  Renal/GU negative Renal ROS  negative genitourinary   Musculoskeletal negative musculoskeletal ROS (+)   Abdominal (+)  Abdomen: soft. Bowel sounds: normal.  Peds  Hematology negative hematology ROS (+)   Anesthesia Other Findings   Reproductive/Obstetrics                            Anesthesia Physical Anesthesia Plan  ASA: 2  Anesthesia Plan: General   Post-op Pain Management:    Induction: Intravenous  PONV Risk Score and Plan: 2 and Ondansetron, Dexamethasone, Propofol infusion and Treatment may vary due to age or medical condition  Airway Management Planned: Mask and LMA  Additional Equipment: None  Intra-op Plan:   Post-operative Plan: Extubation in OR  Informed Consent: I have reviewed the patients History and Physical, chart, labs and discussed the procedure including the risks, benefits and alternatives for the proposed anesthesia with the patient or authorized representative who has indicated his/her understanding and acceptance.     Dental advisory given  Plan Discussed with: CRNA  Anesthesia Plan Comments: (Lab Results      Component                Value               Date                      WBC                      12.7 (H)             03/27/2021                HGB                      16.0                03/29/2021                HCT                      47.0                03/29/2021                MCV                      91.6                03/27/2021                PLT  244                 03/27/2021           Lab Results      Component                Value               Date                      NA                       145                 03/29/2021                K                        4.0                 03/29/2021                CO2                      27                  03/27/2021                GLUCOSE                  96                  03/29/2021                BUN                      8                   03/29/2021                CREATININE               1.00                03/29/2021                CALCIUM                  9.6                 03/27/2021                GFRNONAA                 >60                 03/27/2021                GFRAA                    82                  07/14/2020          )      Anesthesia Quick Evaluation

## 2021-04-05 ENCOUNTER — Ambulatory Visit (HOSPITAL_COMMUNITY)
Admission: RE | Admit: 2021-04-05 | Discharge: 2021-04-05 | Disposition: A | Discharge status: Home / Self Care | Payer: 59 | Source: Ambulatory Visit | Attending: Pulmonary Disease | Admitting: Pulmonary Disease

## 2021-04-05 ENCOUNTER — Encounter (HOSPITAL_COMMUNITY)
Admission: RE | Disposition: A | Discharge status: Home / Self Care | Payer: Self-pay | Source: Ambulatory Visit | Attending: Pulmonary Disease

## 2021-04-05 ENCOUNTER — Encounter (HOSPITAL_COMMUNITY): Payer: Self-pay | Admitting: Pulmonary Disease

## 2021-04-05 ENCOUNTER — Ambulatory Visit (HOSPITAL_COMMUNITY): Payer: 59 | Admitting: Anesthesiology

## 2021-04-05 DIAGNOSIS — C499 Malignant neoplasm of connective and soft tissue, unspecified: Secondary | ICD-10-CM | POA: Diagnosis not present

## 2021-04-05 DIAGNOSIS — E119 Type 2 diabetes mellitus without complications: Secondary | ICD-10-CM | POA: Insufficient documentation

## 2021-04-05 DIAGNOSIS — R590 Localized enlarged lymph nodes: Secondary | ICD-10-CM | POA: Diagnosis not present

## 2021-04-05 DIAGNOSIS — Z8249 Family history of ischemic heart disease and other diseases of the circulatory system: Secondary | ICD-10-CM | POA: Insufficient documentation

## 2021-04-05 DIAGNOSIS — E559 Vitamin D deficiency, unspecified: Secondary | ICD-10-CM | POA: Diagnosis not present

## 2021-04-05 DIAGNOSIS — E782 Mixed hyperlipidemia: Secondary | ICD-10-CM | POA: Diagnosis not present

## 2021-04-05 DIAGNOSIS — R918 Other nonspecific abnormal finding of lung field: Secondary | ICD-10-CM

## 2021-04-05 DIAGNOSIS — R599 Enlarged lymph nodes, unspecified: Secondary | ICD-10-CM | POA: Diagnosis not present

## 2021-04-05 DIAGNOSIS — I10 Essential (primary) hypertension: Secondary | ICD-10-CM | POA: Diagnosis not present

## 2021-04-05 DIAGNOSIS — E785 Hyperlipidemia, unspecified: Secondary | ICD-10-CM | POA: Insufficient documentation

## 2021-04-05 DIAGNOSIS — C801 Malignant (primary) neoplasm, unspecified: Secondary | ICD-10-CM | POA: Diagnosis not present

## 2021-04-05 DIAGNOSIS — C3411 Malignant neoplasm of upper lobe, right bronchus or lung: Secondary | ICD-10-CM | POA: Insufficient documentation

## 2021-04-05 DIAGNOSIS — F17210 Nicotine dependence, cigarettes, uncomplicated: Secondary | ICD-10-CM | POA: Insufficient documentation

## 2021-04-05 DIAGNOSIS — C771 Secondary and unspecified malignant neoplasm of intrathoracic lymph nodes: Secondary | ICD-10-CM | POA: Diagnosis not present

## 2021-04-05 DIAGNOSIS — C7931 Secondary malignant neoplasm of brain: Secondary | ICD-10-CM | POA: Diagnosis not present

## 2021-04-05 HISTORY — PX: BRONCHIAL NEEDLE ASPIRATION BIOPSY: SHX5106

## 2021-04-05 HISTORY — PX: VIDEO BRONCHOSCOPY WITH ENDOBRONCHIAL ULTRASOUND: SHX6177

## 2021-04-05 LAB — GLUCOSE, CAPILLARY
Glucose-Capillary: 127 mg/dL — ABNORMAL HIGH (ref 70–99)
Glucose-Capillary: 111 mg/dL — ABNORMAL HIGH (ref 70–99)

## 2021-04-05 SURGERY — BRONCHOSCOPY, WITH EBUS
Anesthesia: General

## 2021-04-05 MED ORDER — FENTANYL CITRATE (PF) 100 MCG/2ML IJ SOLN
INTRAMUSCULAR | Status: AC
  Filled 2021-04-05: qty 2

## 2021-04-05 MED ORDER — CHLORHEXIDINE GLUCONATE 0.12 % MT SOLN
15.0000 mL | Freq: Once | OROMUCOSAL | Status: AC
  Administered 2021-04-05: 15 mL via OROMUCOSAL

## 2021-04-05 MED ORDER — FENTANYL CITRATE (PF) 100 MCG/2ML IJ SOLN: 25.0000 ug | INTRAMUSCULAR | Status: DC | PRN

## 2021-04-05 MED ORDER — PROPOFOL 10 MG/ML IV BOLUS
INTRAVENOUS | Status: DC | PRN
  Administered 2021-04-05: 200 mg via INTRAVENOUS

## 2021-04-05 MED ORDER — ACETAMINOPHEN 10 MG/ML IV SOLN: 1000.0000 mg | Freq: Once | INTRAVENOUS | Status: DC | PRN

## 2021-04-05 MED ORDER — SCOPOLAMINE 1 MG/3DAYS TD PT72
MEDICATED_PATCH | TRANSDERMAL | Status: DC | PRN
  Administered 2021-04-05: 1 via TRANSDERMAL

## 2021-04-05 MED ORDER — ONDANSETRON HCL 4 MG/2ML IJ SOLN
INTRAMUSCULAR | Status: DC | PRN
  Administered 2021-04-05: 4 mg via INTRAVENOUS

## 2021-04-05 MED ORDER — GLYCOPYRROLATE PF 0.2 MG/ML IJ SOSY
PREFILLED_SYRINGE | INTRAMUSCULAR | Status: DC | PRN
  Administered 2021-04-05: .2 mg via INTRAVENOUS

## 2021-04-05 MED ORDER — EPHEDRINE SULFATE 50 MG/ML IJ SOLN
INTRAMUSCULAR | Status: DC | PRN
  Administered 2021-04-05 (×2): 10 mg via INTRAVENOUS

## 2021-04-05 MED ORDER — PHENYLEPHRINE HCL (PRESSORS) 10 MG/ML IV SOLN
INTRAVENOUS | Status: DC | PRN
  Administered 2021-04-05 (×6): 100 ug via INTRAVENOUS

## 2021-04-05 MED ORDER — CHLORHEXIDINE GLUCONATE 0.12 % MT SOLN
OROMUCOSAL | Status: AC
  Filled 2021-04-05: qty 15

## 2021-04-05 MED ORDER — LIDOCAINE 2% (20 MG/ML) 5 ML SYRINGE
INTRAMUSCULAR | Status: DC | PRN
  Administered 2021-04-05: 100 mg via INTRAVENOUS

## 2021-04-05 MED ORDER — FENTANYL CITRATE (PF) 100 MCG/2ML IJ SOLN
INTRAMUSCULAR | Status: DC | PRN
  Administered 2021-04-05: 100 ug via INTRAVENOUS

## 2021-04-05 MED ORDER — DEXAMETHASONE SODIUM PHOSPHATE 10 MG/ML IJ SOLN
INTRAMUSCULAR | Status: DC | PRN
  Administered 2021-04-05: 5 mg via INTRAVENOUS

## 2021-04-05 MED ORDER — PROMETHAZINE HCL 25 MG/ML IJ SOLN: 6.2500 mg | INTRAMUSCULAR | Status: DC | PRN

## 2021-04-05 MED ORDER — LIDOCAINE 1 % OPTIME INJ - NO CHARGE
INTRAMUSCULAR | Status: DC | PRN
  Administered 2021-04-05: 10 mL

## 2021-04-05 MED ORDER — LIDOCAINE HCL (PF) 1 % IJ SOLN
INTRAMUSCULAR | Status: AC
  Filled 2021-04-05: qty 30

## 2021-04-05 MED ORDER — LACTATED RINGERS IV SOLN: INTRAVENOUS | Status: DC

## 2021-04-05 SURGICAL SUPPLY — 29 items
BRUSH CYTOL CELLEBRITY 1.5X140 (MISCELLANEOUS) IMPLANT
CANISTER SUCT 3000ML PPV (MISCELLANEOUS) ×3 IMPLANT
CONT SPEC 4OZ CLIKSEAL STRL BL (MISCELLANEOUS) ×3 IMPLANT
COVER BACK TABLE 60X90IN (DRAPES) ×3 IMPLANT
COVER DOME SNAP 22 D (MISCELLANEOUS) ×3 IMPLANT
FORCEPS BIOP RJ4 1.8 (CUTTING FORCEPS) IMPLANT
GAUZE SPONGE 4X4 12PLY STRL (GAUZE/BANDAGES/DRESSINGS) ×3 IMPLANT
GLOVE BIO SURGEON STRL SZ7.5 (GLOVE) ×3 IMPLANT
GOWN STRL REUS W/ TWL LRG LVL3 (GOWN DISPOSABLE) ×2 IMPLANT
GOWN STRL REUS W/TWL LRG LVL3 (GOWN DISPOSABLE) ×1
KIT CLEAN ENDO COMPLIANCE (KITS) ×6 IMPLANT
KIT TURNOVER KIT B (KITS) ×3 IMPLANT
MARKER SKIN DUAL TIP RULER LAB (MISCELLANEOUS) ×3 IMPLANT
NEEDLE EBUS SONO TIP PENTAX (NEEDLE) ×3 IMPLANT
NS IRRIG 1000ML POUR BTL (IV SOLUTION) ×3 IMPLANT
OIL SILICONE PENTAX (PARTS (SERVICE/REPAIRS)) ×3 IMPLANT
PAD ARMBOARD 7.5X6 YLW CONV (MISCELLANEOUS) ×6 IMPLANT
SOL ANTI FOG 6CC (MISCELLANEOUS) ×2 IMPLANT
SOLUTION ANTI FOG 6CC (MISCELLANEOUS) ×1
SYR 20CC LL (SYRINGE) ×6 IMPLANT
SYR 20ML ECCENTRIC (SYRINGE) ×6 IMPLANT
SYR 50ML SLIP (SYRINGE) IMPLANT
SYR 5ML LUER SLIP (SYRINGE) ×3 IMPLANT
TOWEL OR 17X24 6PK STRL BLUE (TOWEL DISPOSABLE) ×3 IMPLANT
TRAP SPECIMEN MUCOUS 40CC (MISCELLANEOUS) IMPLANT
TUBE CONNECTING 20X1/4 (TUBING) ×6 IMPLANT
UNDERPAD 30X30 (UNDERPADS AND DIAPERS) ×3 IMPLANT
VALVE DISPOSABLE (MISCELLANEOUS) ×3 IMPLANT
WATER STERILE IRR 1000ML POUR (IV SOLUTION) ×3 IMPLANT

## 2021-04-05 NOTE — Anesthesia Postprocedure Evaluation (Signed)
Anesthesia Post Note  Patient: Bradley Goodman  Procedure(s) Performed: VIDEO BRONCHOSCOPY WITH ENDOBRONCHIAL ULTRASOUND BRONCHIAL NEEDLE ASPIRATION BIOPSIES     Patient location during evaluation: PACU Anesthesia Type: General Level of consciousness: awake and alert Pain management: pain level controlled Vital Signs Assessment: post-procedure vital signs reviewed and stable Respiratory status: spontaneous breathing, nonlabored ventilation, respiratory function stable and patient connected to nasal cannula oxygen Cardiovascular status: blood pressure returned to baseline and stable Postop Assessment: no apparent nausea or vomiting Anesthetic complications: no   No notable events documented.  Last Vitals:  Vitals:   04/05/21 0845 04/05/21 0853  BP:  123/87  Pulse: 90 93  Resp: 18 16  Temp:  36.5 C  SpO2: 94% 98%    Last Pain:  Vitals:   04/05/21 0853  TempSrc:   PainSc: 0-No pain                 Belenda Cruise P Amarionna Arca

## 2021-04-05 NOTE — Consult Note (Addendum)
Synopsis: Referred in June 2022 for abnormal CT imaging referred by Dr. Ledon Snare  Subjective:   PATIENT ID: Bradley Goodman GENDER: male DOB: 11-01-1955, MRN: [id number]  Chief complaint, abnormal imaging concern for cancer  This is a 65 year old, past medical history of hypertension, gastroesophageal reflux, hyperlipidemia, type 2 diabetes, current every day smoker, 3/4 pack/day started 1975.  He has had a colonoscopy before.  Found to have abnormal brain and CT imaging of the chest.  MRI of the brain was completed on 03/27/2021 which was this was concern for 2 small punctate focuses right parietal lobe and left cerebellum concerning for small metastasis.  CT of the chest reveals 12 mm right upper lobe pulmonary nodule other small satellite lesions.  Also large subcarinal adenopathy concerning for metastatic disease.  Patient was referred to pulmonary for consideration for bronchoscopy and tissue biopsy.   Past Medical History:  Diagnosis Date   Cervical spondylolysis    Essential hypertension    GERD (gastroesophageal reflux disease)    History of kidney stones    History of migraine    Hyperlipidemia    PONV (postoperative nausea and vomiting)    Type 2 diabetes mellitus (HCC)      Family History  Problem Relation Age of Onset   Heart attack Mother    Heart attack Father    Heart attack Brother    Colon cancer Neg Hx      Past Surgical History:  Procedure Laterality Date   Bilateral inguinal hernia repair     COLONOSCOPY  01/23/2012   Procedure: COLONOSCOPY;  Surgeon: Daneil Dolin, MD;  [facility];  Service: Endoscopy;  Laterality: N/A;  9:30 AM   COLONOSCOPY N/A 10/11/2015   Procedure: COLONOSCOPY;  Surgeon: Daneil Dolin, MD;  [facility];  Service: Endoscopy;  Laterality: N/A;  830   COLONOSCOPY N/A 10/14/2019   Procedure: COLONOSCOPY;  Surgeon: Daneil Dolin, MD;  [facility];  Service: Endoscopy;  Laterality: N/A;  1:45    POLYPECTOMY  10/14/2019   Procedure: POLYPECTOMY;  Surgeon: Daneil Dolin, MD;  [facility];  Service: Endoscopy;;  ascending colon, descending colon    Social History   Socioeconomic History   Marital status: Married    Spouse name: Not on file   Number of children: Not on file   Years of education: Not on file   Highest education level: Not on file  Occupational History   Not on file  Tobacco Use   Smoking status: Every Day    Packs/day: 0.75    Years: 30.00    Pack years: 22.50    Types: Cigarettes    Start date: 10/30/1973   Smokeless tobacco: Never  Vaping Use   Vaping Use: Never used  Substance and Sexual Activity   Alcohol use: Yes    Alcohol/week: 0.0 standard drinks    Comment: One drink every 6 months.   Drug use: No   Sexual activity: Not on file  Other Topics Concern   Not on file  Social History Narrative   Not on file   Social Determinants of Health   Financial Resource Strain: Not on file  Food Insecurity: Not on file  Transportation Needs: Not on file  Physical Activity: Not on file  Stress: Not on file  Social Connections: Not on file  Intimate Partner Violence: Not on file     No Known Allergies   @ENCMEDSTART @  Review of  Systems  Constitutional:  Negative for chills, fever, malaise/fatigue and weight loss.  HENT:  Negative for hearing loss, sore throat and tinnitus.   Eyes:  Negative for blurred vision and double vision.  Respiratory:  Negative for cough, hemoptysis, sputum production, shortness of breath, wheezing and stridor.   Cardiovascular:  Negative for chest pain, palpitations, orthopnea, leg swelling and PND.  Gastrointestinal:  Negative for abdominal pain, constipation, diarrhea, heartburn, nausea and vomiting.  Genitourinary:  Negative for dysuria, hematuria and urgency.  Musculoskeletal:  Negative for joint pain and myalgias.  Skin:  Negative for itching and rash.  Neurological:  Negative for dizziness, tingling,  weakness and headaches.  Endo/Heme/Allergies:  Negative for environmental allergies. Does not bruise/bleed easily.  Psychiatric/Behavioral:  Negative for depression. The patient is not nervous/anxious and does not have insomnia.   All other systems reviewed and are negative.   Objective:  Physical Exam Vitals reviewed.  Constitutional:      General: He is not in acute distress.    Appearance: He is well-developed.  HENT:     Head: Normocephalic and atraumatic.  Eyes:     General: No scleral icterus.    Conjunctiva/sclera: Conjunctivae normal.     Pupils: Pupils are equal, round, and reactive to light.  Neck:     Vascular: No JVD.     Trachea: No tracheal deviation.  Cardiovascular:     Rate and Rhythm: Normal rate and regular rhythm.     Heart sounds: Normal heart sounds. No murmur heard. Pulmonary:     Effort: Pulmonary effort is normal. No tachypnea, accessory muscle usage or respiratory distress.     Breath sounds: Normal breath sounds. No stridor. No wheezing, rhonchi or rales.  Abdominal:     General: Bowel sounds are normal. There is no distension.     Palpations: Abdomen is soft.     Tenderness: There is no abdominal tenderness.  Musculoskeletal:        General: No tenderness.     Cervical back: Neck supple.  Lymphadenopathy:     Cervical: No cervical adenopathy.  Skin:    General: Skin is warm and dry.     Capillary Refill: Capillary refill takes less than 2 seconds.     Findings: No rash.  Neurological:     Mental Status: He is alert and oriented to person, place, and time.  Psychiatric:        Behavior: Behavior normal.     Vitals:   04/05/21 0554  BP: (!) 151/101  Pulse: 90  Resp: 18  Temp: 98.3 F (36.8 C)  TempSrc: Oral  SpO2: 99%  Weight: 75.8 kg  Height: 6' (1.829 m)   99% on RA BMI Readings from Last 3 Encounters:  04/05/21 22.65 kg/m  03/27/21 22.65 kg/m  01/10/21 22.09 kg/m   Wt Readings from Last 3 Encounters:  04/05/21 75.8 kg   03/27/21 75.8 kg  01/10/21 75.9 kg     CBC    Component Value Date/Time   WBC 12.7 (H) 03/27/2021 1154   RBC 5.58 03/27/2021 1154   HGB 16.0 03/29/2021 1520   HGB 14.8 01/10/2021 0849   HCT 47.0 03/29/2021 1520   HCT 44.3 01/10/2021 0849   PLT 244 03/27/2021 1154   PLT 238 01/10/2021 0849   MCV 91.6 03/27/2021 1154   MCV 86 01/10/2021 0849   MCH 29.4 03/27/2021 1154   MCHC 32.1 03/27/2021 1154   RDW 14.8 03/27/2021 1154   RDW 13.7 01/10/2021 0849  LYMPHSABS 2.2 03/27/2021 1154   LYMPHSABS 1.7 01/10/2021 0849   MONOABS 0.7 03/27/2021 1154   EOSABS 0.1 03/27/2021 1154   EOSABS 0.1 01/10/2021 0849   BASOSABS 0.1 03/27/2021 1154   BASOSABS 0.1 01/10/2021 0849     Chest Imaging: June 2022 CT chest: Multiple small pulmonary nodules, enlarged subcarinal adenopathy concerning for advanced age bronchogenic carcinoma. The patient's images have been independently reviewed by me.    Pulmonary Functions Testing Results: No flowsheet data found.  FeNO:   Pathology:   Echocardiogram:   Heart Catheterization:     Assessment & Plan:   This is a 65 year old gentleman, longstanding history of smoking, current smoker, multiple small pulmonary nodules, largest at 12 mm, concern for other additional satellite lesions within the chest, enlarged subcarinal adenopathy as well as MRI of the brain with 2 small punctate lesions concerning for metastasis.  Overall imaging and history concerning for an advanced stage bronchogenic carcinoma.   Discussion: Today we discussed the risk benefits and alternatives of proceeding with tissue biopsy. Patient will need a video bronchoscopy with endobronchial ultrasound and transbronchial needle aspirations of the subcarinal adenopathy. We discussed the risk of bleeding as well as pneumothorax. Patient is agreeable to proceed.  All questions were answered prior to the procedure. Once we have a adequate tissue diagnosis referrals will be placed for  medical oncology. Patient already has a PET scan that has been scheduled.  We appreciate the consultation.   Current Facility-Administered Medications:    chlorhexidine (PERIDEX) 0.12 % solution, , , ,    lactated ringers infusion, , Intravenous, Continuous, Stoltzfus, March Rummage, DO   Garner Nash, DO [facility] 04/05/2021 7:08 AM

## 2021-04-05 NOTE — Discharge Instructions (Signed)
Flexible Bronchoscopy, Care After This sheet gives you information about how to care for yourself after your test. Your doctor may also give you more specific instructions. If you have problems or questions, contact your doctor. Follow these instructions at home: Eating and drinking Do not eat or drink anything (not even water) for 2 hours after your test, or until your numbing medicine (local anesthetic) wears off. When your numbness is gone and your cough and gag reflexes have come back, you may: Eat only soft foods. Slowly drink liquids. The day after the test, go back to your normal diet. Driving Do not drive for 24 hours if you were given a medicine to help you relax (sedative). Do not drive or use heavy machinery while taking prescription pain medicine. General instructions  Take over-the-counter and prescription medicines only as told by your doctor. Return to your normal activities as told. Ask what activities are safe for you. Do not use any products that have nicotine or tobacco in them. This includes cigarettes and e-cigarettes. If you need help quitting, ask your doctor. Keep all follow-up visits as told by your doctor. This is important. It is very important if you had a tissue sample (biopsy) taken. Get help right away if: You have shortness of breath that gets worse. You get light-headed. You feel like you are going to pass out (faint). You have chest pain. You cough up: More than a little blood. More blood than before. Summary Do not eat or drink anything (not even water) for 2 hours after your test, or until your numbing medicine wears off. Do not use cigarettes. Do not use e-cigarettes. Get help right away if you have chest pain.  This information is not intended to replace advice given to you by your health care provider. Make sure you discuss any questions you have with your health care provider. Document Released: 07/22/2009 Document Revised: 09/06/2017 Document  Reviewed: 10/12/2016 Elsevier Patient Education  2020 [employer].

## 2021-04-05 NOTE — Anesthesia Procedure Notes (Signed)
Procedure Name: LMA Insertion Date/Time: 04/05/2021 7:42 AM Performed by: Reeves Dam, CRNA Pre-anesthesia Checklist: Patient identified, Patient being monitored, Timeout performed, Emergency Drugs available and Suction available Patient Re-evaluated:Patient Re-evaluated prior to induction Oxygen Delivery Method: Circle system utilized Preoxygenation: Pre-oxygenation with 100% oxygen Induction Type: IV induction Ventilation: Mask ventilation without difficulty LMA: LMA inserted LMA Size: 5.0 Tube type: Oral Number of attempts: 1 Placement Confirmation: positive ETCO2 and breath sounds checked- equal and bilateral Tube secured with: Tape Dental Injury: Teeth and Oropharynx as per pre-operative assessment

## 2021-04-05 NOTE — Interval H&P Note (Signed)
History and Physical Interval Note:  04/05/2021 7:14 AM  Bradley Goodman  has presented today for surgery, with the diagnosis of Adenopathy.  The various methods of treatment have been discussed with the patient and family. After consideration of risks, benefits and other options for treatment, the patient has consented to  Procedure(s): [place] (N/A) as a surgical intervention.  The patient's history has been reviewed, patient examined, no change in status, stable for surgery.  I have reviewed the patient's chart and labs.  Questions were answered to the patient's satisfaction.     [place]

## 2021-04-05 NOTE — Transfer of Care (Signed)
Immediate Anesthesia Transfer of Care Note  Patient: Bradley Goodman  Procedure(s) Performed: VIDEO BRONCHOSCOPY WITH ENDOBRONCHIAL ULTRASOUND BRONCHIAL NEEDLE ASPIRATION BIOPSIES  Patient Location: PACU  Anesthesia Type:General  Level of Consciousness: awake, alert  and oriented  Airway & Oxygen Therapy: Patient Spontanous Breathing and Patient connected to nasal cannula oxygen  Post-op Assessment: Report given to RN and Post -op Vital signs reviewed and stable  Post vital signs: Reviewed and stable  Last Vitals:  Vitals Value Taken Time  BP    Temp    Pulse 90 04/05/21 0825  Resp 15 04/05/21 0825  SpO2 100 % 04/05/21 0825  Vitals shown include unvalidated device data.  Last Pain:  Vitals:   04/05/21 0610  TempSrc:   PainSc: 0-No pain      Patients Stated Pain Goal: 5 (54/09/81 1914)  Complications: No notable events documented.

## 2021-04-05 NOTE — H&P (View-Only) (Signed)
Synopsis: Referred in June 2022 for abnormal CT imaging referred by Dr. Ledon Snare  Subjective:   PATIENT ID: Bradley Goodman GENDER: male DOB: August 15, 1956, MRN: [id number]  Chief complaint, abnormal imaging concern for cancer  This is a 65 year old, past medical history of hypertension, gastroesophageal reflux, hyperlipidemia, type 2 diabetes, current every day smoker, 3/4 pack/day started 1975.  He has had a colonoscopy before.  Found to have abnormal brain and CT imaging of the chest.  MRI of the brain was completed on 03/27/2021 which was this was concern for 2 small punctate focuses right parietal lobe and left cerebellum concerning for small metastasis.  CT of the chest reveals 12 mm right upper lobe pulmonary nodule other small satellite lesions.  Also large subcarinal adenopathy concerning for metastatic disease.  Patient was referred to pulmonary for consideration for bronchoscopy and tissue biopsy.   Past Medical History:  Diagnosis Date   Cervical spondylolysis    Essential hypertension    GERD (gastroesophageal reflux disease)    History of kidney stones    History of migraine    Hyperlipidemia    PONV (postoperative nausea and vomiting)    Type 2 diabetes mellitus (HCC)      Family History  Problem Relation Age of Onset   Heart attack Mother    Heart attack Father    Heart attack Brother    Colon cancer Neg Hx      Past Surgical History:  Procedure Laterality Date   Bilateral inguinal hernia repair     COLONOSCOPY  01/23/2012   Procedure: COLONOSCOPY;  Surgeon: Daneil Dolin, MD;  [facility];  Service: Endoscopy;  Laterality: N/A;  9:30 AM   COLONOSCOPY N/A 10/11/2015   Procedure: COLONOSCOPY;  Surgeon: Daneil Dolin, MD;  [facility];  Service: Endoscopy;  Laterality: N/A;  830   COLONOSCOPY N/A 10/14/2019   Procedure: COLONOSCOPY;  Surgeon: Daneil Dolin, MD;  [facility];  Service: Endoscopy;  Laterality: N/A;  1:45    POLYPECTOMY  10/14/2019   Procedure: POLYPECTOMY;  Surgeon: Daneil Dolin, MD;  [facility];  Service: Endoscopy;;  ascending colon, descending colon    Social History   Socioeconomic History   Marital status: Married    Spouse name: Not on file   Number of children: Not on file   Years of education: Not on file   Highest education level: Not on file  Occupational History   Not on file  Tobacco Use   Smoking status: Every Day    Packs/day: 0.75    Years: 30.00    Pack years: 22.50    Types: Cigarettes    Start date: 10/30/1973   Smokeless tobacco: Never  Vaping Use   Vaping Use: Never used  Substance and Sexual Activity   Alcohol use: Yes    Alcohol/week: 0.0 standard drinks    Comment: One drink every 6 months.   Drug use: No   Sexual activity: Not on file  Other Topics Concern   Not on file  Social History Narrative   Not on file   Social Determinants of Health   Financial Resource Strain: Not on file  Food Insecurity: Not on file  Transportation Needs: Not on file  Physical Activity: Not on file  Stress: Not on file  Social Connections: Not on file  Intimate Partner Violence: Not on file     No Known Allergies   @ENCMEDSTART @  ROS  Objective:  Physical Exam   Vitals:   04/05/21 0554  BP: (!) 151/101  Pulse: 90  Resp: 18  Temp: 98.3 F (36.8 C)  TempSrc: Oral  SpO2: 99%  Weight: 75.8 kg  Height: 6' (1.829 m)   99% on RA BMI Readings from Last 3 Encounters:  04/05/21 22.65 kg/m  03/27/21 22.65 kg/m  01/10/21 22.09 kg/m   Wt Readings from Last 3 Encounters:  04/05/21 75.8 kg  03/27/21 75.8 kg  01/10/21 75.9 kg     CBC    Component Value Date/Time   WBC 12.7 (H) 03/27/2021 1154   RBC 5.58 03/27/2021 1154   HGB 16.0 03/29/2021 1520   HGB 14.8 01/10/2021 0849   HCT 47.0 03/29/2021 1520   HCT 44.3 01/10/2021 0849   PLT 244 03/27/2021 1154   PLT 238 01/10/2021 0849   MCV 91.6 03/27/2021 1154   MCV 86 01/10/2021  0849   MCH 29.4 03/27/2021 1154   MCHC 32.1 03/27/2021 1154   RDW 14.8 03/27/2021 1154   RDW 13.7 01/10/2021 0849   LYMPHSABS 2.2 03/27/2021 1154   LYMPHSABS 1.7 01/10/2021 0849   MONOABS 0.7 03/27/2021 1154   EOSABS 0.1 03/27/2021 1154   EOSABS 0.1 01/10/2021 0849   BASOSABS 0.1 03/27/2021 1154   BASOSABS 0.1 01/10/2021 0849     Chest Imaging: June 2022 CT chest: Multiple small pulmonary nodules, enlarged subcarinal adenopathy concerning for advanced age bronchogenic carcinoma. The patient's images have been independently reviewed by me.    Pulmonary Functions Testing Results: No flowsheet data found.  FeNO:   Pathology:   Echocardiogram:   Heart Catheterization:     Assessment & Plan:   This is a 65 year old gentleman, longstanding history of smoking, current smoker, multiple small pulmonary nodules, largest at 12 mm, concern for other additional satellite lesions within the chest, enlarged subcarinal adenopathy as well as MRI of the brain with 2 small punctate lesions concerning for metastasis.  Overall imaging and history concerning for an advanced stage bronchogenic carcinoma.   Discussion: Today we discussed the risk benefits and alternatives of proceeding with tissue biopsy. Patient will need a video bronchoscopy with endobronchial ultrasound and transbronchial needle aspirations of the subcarinal adenopathy. We discussed the risk of bleeding as well as pneumothorax. Patient is agreeable to proceed.  All questions were answered prior to the procedure. Once we have a adequate tissue diagnosis referrals will be placed for medical oncology. Patient already has a PET scan that has been scheduled.  We appreciate the consultation.   Current Facility-Administered Medications:    chlorhexidine (PERIDEX) 0.12 % solution, , , ,    lactated ringers infusion, , Intravenous, Continuous, Stoltzfus, March Rummage, DO   Garner Nash, DO [facility] 04/05/2021 7:08 AM

## 2021-04-05 NOTE — Progress Notes (Signed)
PCCM:  I tried calling family at number in the chart there was no answer.   Garner Nash, DO [facility] 04/05/2021 9:33 AM

## 2021-04-05 NOTE — Op Note (Signed)
Video Bronchoscopy with Endobronchial Ultrasound Procedure Note  Date of Operation: 04/05/2021  Pre-op Diagnosis: Mediastinal adenopathy  Post-op Diagnosis: Mediastinal adenopathy  Surgeon: Garner Nash, DO  Assistants: None   Anesthesia: General endotracheal anesthesia  Operation: Flexible video fiberoptic bronchoscopy with endobronchial ultrasound and biopsies.  Estimated Blood Loss: Minimal  Complications: None   Indications and History: Bradley Goodman is a 64 y.o. 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  The risks, benefits, complications, treatment options and expected outcomes were discussed with the patient.  The possibilities of pneumothorax, pneumonia, reaction to medication, pulmonary aspiration, perforation of a viscus, bleeding, failure to diagnose a condition and creating a complication requiring transfusion or operation were discussed with the patient who freely signed the consent.    Description of Procedure: The patient was examined in the preoperative area and history and data from the preprocedure consultation were reviewed. It was deemed appropriate to proceed.  The patient was taken to [facility] endoscopy room 2, identified as Bradley Goodman and the procedure verified as Flexible Video Fiberoptic Bronchoscopy.  A Time Out was held and the above information confirmed. After being taken to the operating room general anesthesia was initiated and the patient  was orally intubated. The video fiberoptic bronchoscope was introduced via the endotracheal tube and a general inspection was performed which showed normal right and left lung anatomy. The standard scope was then withdrawn and the endobronchial ultrasound was used to identify and characterize the peritracheal, hilar and bronchial lymph nodes. Inspection showed small hilar adenopathy, enlarged subcarinal adenopathy. Using real-time  ultrasound guidance Wang needle biopsies were take from Station 7 nodes and were sent for cytology.  Standard therapeutic bronchoscope was inserted into the patient's airway aspiration of the bilateral mainstem's was completed for removal of any remaining blood clots and debris.  All distal subsegments were patent at the termination of the procedure. The patient tolerated the procedure well without apparent complications. There was no significant blood loss. The bronchoscope was withdrawn. Anesthesia was reversed and the patient was taken to the PACU for recovery.   Samples: 1. Wang needle biopsies from 7 node  Plans:  The patient will be discharged from the PACU to home when recovered from anesthesia. We will review the cytology, pathology and microbiology results with the patient when they become available. Outpatient followup will be with Garner Nash, [place] Onyekachi Gathright, DO [facility] 04/05/2021 8:24 AM

## 2021-04-05 NOTE — H&P (View-Only) (Signed)
Synopsis: Referred in June 2022 for abnormal CT imaging referred by Dr. Ledon Snare  Subjective:   PATIENT ID: Bradley Goodman GENDER: male DOB: Jun 12, 1956, MRN: [id number]  Chief complaint, abnormal imaging concern for cancer  This is a 65 year old, past medical history of hypertension, gastroesophageal reflux, hyperlipidemia, type 2 diabetes, current every day smoker, 3/4 pack/day started 1975.  He has had a colonoscopy before.  Found to have abnormal brain and CT imaging of the chest.  MRI of the brain was completed on 03/27/2021 which was this was concern for 2 small punctate focuses right parietal lobe and left cerebellum concerning for small metastasis.  CT of the chest reveals 12 mm right upper lobe pulmonary nodule other small satellite lesions.  Also large subcarinal adenopathy concerning for metastatic disease.  Patient was referred to pulmonary for consideration for bronchoscopy and tissue biopsy.   Past Medical History:  Diagnosis Date   Cervical spondylolysis    Essential hypertension    GERD (gastroesophageal reflux disease)    History of kidney stones    History of migraine    Hyperlipidemia    PONV (postoperative nausea and vomiting)    Type 2 diabetes mellitus (HCC)      Family History  Problem Relation Age of Onset   Heart attack Mother    Heart attack Father    Heart attack Brother    Colon cancer Neg Hx      Past Surgical History:  Procedure Laterality Date   Bilateral inguinal hernia repair     COLONOSCOPY  01/23/2012   Procedure: COLONOSCOPY;  Surgeon: Daneil Dolin, MD;  [facility];  Service: Endoscopy;  Laterality: N/A;  9:30 AM   COLONOSCOPY N/A 10/11/2015   Procedure: COLONOSCOPY;  Surgeon: Daneil Dolin, MD;  Location: [facility];  Service: Endoscopy;  Laterality: N/A;  830   COLONOSCOPY N/A 10/14/2019   Procedure: COLONOSCOPY;  Surgeon: Daneil Dolin, MD;  Location: [facility];  Service: Endoscopy;  Laterality: N/A;  1:45    POLYPECTOMY  10/14/2019   Procedure: POLYPECTOMY;  Surgeon: Daneil Dolin, MD;  Location: [facility];  Service: Endoscopy;;  ascending colon, descending colon    Social History   Socioeconomic History   Marital status: Married    Spouse name: Not on file   Number of children: Not on file   Years of education: Not on file   Highest education level: Not on file  Occupational History   Not on file  Tobacco Use   Smoking status: Every Day    Packs/day: 0.75    Years: 30.00    Pack years: 22.50    Types: Cigarettes    Start date: 10/30/1973   Smokeless tobacco: Never  Vaping Use   Vaping Use: Never used  Substance and Sexual Activity   Alcohol use: Yes    Alcohol/week: 0.0 standard drinks    Comment: One drink every 6 months.   Drug use: No   Sexual activity: Not on file  Other Topics Concern   Not on file  Social History Narrative   Not on file   Social Determinants of Health   Financial Resource Strain: Not on file  Food Insecurity: Not on file  Transportation Needs: Not on file  Physical Activity: Not on file  Stress: Not on file  Social Connections: Not on file  Intimate Partner Violence: Not on file     No Known Allergies   @ENCMEDSTART @  Review of  Systems  Constitutional:  Negative for chills, fever, malaise/fatigue and weight loss.  HENT:  Negative for hearing loss, sore throat and tinnitus.   Eyes:  Negative for blurred vision and double vision.  Respiratory:  Negative for cough, hemoptysis, sputum production, shortness of breath, wheezing and stridor.   Cardiovascular:  Negative for chest pain, palpitations, orthopnea, leg swelling and PND.  Gastrointestinal:  Negative for abdominal pain, constipation, diarrhea, heartburn, nausea and vomiting.  Genitourinary:  Negative for dysuria, hematuria and urgency.  Musculoskeletal:  Negative for joint pain and myalgias.  Skin:  Negative for itching and rash.  Neurological:  Negative for dizziness, tingling,  weakness and headaches.  Endo/Heme/Allergies:  Negative for environmental allergies. Does not bruise/bleed easily.  Psychiatric/Behavioral:  Negative for depression. The patient is not nervous/anxious and does not have insomnia.   All other systems reviewed and are negative.   Objective:  Physical Exam Vitals reviewed.  Constitutional:      General: He is not in acute distress.    Appearance: He is well-developed.  HENT:     Head: Normocephalic and atraumatic.  Eyes:     General: No scleral icterus.    Conjunctiva/sclera: Conjunctivae normal.     Pupils: Pupils are equal, round, and reactive to light.  Neck:     Vascular: No JVD.     Trachea: No tracheal deviation.  Cardiovascular:     Rate and Rhythm: Normal rate and regular rhythm.     Heart sounds: Normal heart sounds. No murmur heard. Pulmonary:     Effort: Pulmonary effort is normal. No tachypnea, accessory muscle usage or respiratory distress.     Breath sounds: Normal breath sounds. No stridor. No wheezing, rhonchi or rales.  Abdominal:     General: Bowel sounds are normal. There is no distension.     Palpations: Abdomen is soft.     Tenderness: There is no abdominal tenderness.  Musculoskeletal:        General: No tenderness.     Cervical back: Neck supple.  Lymphadenopathy:     Cervical: No cervical adenopathy.  Skin:    General: Skin is warm and dry.     Capillary Refill: Capillary refill takes less than 2 seconds.     Findings: No rash.  Neurological:     Mental Status: He is alert and oriented to person, place, and time.  Psychiatric:        Behavior: Behavior normal.     Vitals:   04/05/21 0554  BP: (!) 151/101  Pulse: 90  Resp: 18  Temp: 98.3 F (36.8 C)  TempSrc: Oral  SpO2: 99%  Weight: 75.8 kg  Height: 6' (1.829 m)   99% on RA BMI Readings from Last 3 Encounters:  04/05/21 22.65 kg/m  03/27/21 22.65 kg/m  01/10/21 22.09 kg/m   Wt Readings from Last 3 Encounters:  04/05/21 75.8 kg   03/27/21 75.8 kg  01/10/21 75.9 kg     CBC    Component Value Date/Time   WBC 12.7 (H) 03/27/2021 1154   RBC 5.58 03/27/2021 1154   HGB 16.0 03/29/2021 1520   HGB 14.8 01/10/2021 0849   HCT 47.0 03/29/2021 1520   HCT 44.3 01/10/2021 0849   PLT 244 03/27/2021 1154   PLT 238 01/10/2021 0849   MCV 91.6 03/27/2021 1154   MCV 86 01/10/2021 0849   MCH 29.4 03/27/2021 1154   MCHC 32.1 03/27/2021 1154   RDW 14.8 03/27/2021 1154   RDW 13.7 01/10/2021 0849  LYMPHSABS 2.2 03/27/2021 1154   LYMPHSABS 1.7 01/10/2021 0849   MONOABS 0.7 03/27/2021 1154   EOSABS 0.1 03/27/2021 1154   EOSABS 0.1 01/10/2021 0849   BASOSABS 0.1 03/27/2021 1154   BASOSABS 0.1 01/10/2021 0849     Chest Imaging: June 2022 CT chest: Multiple small pulmonary nodules, enlarged subcarinal adenopathy concerning for advanced age bronchogenic carcinoma. The patient's images have been independently reviewed by me.    Pulmonary Functions Testing Results: No flowsheet data found.  FeNO:   Pathology:   Echocardiogram:   Heart Catheterization:     Assessment & Plan:   This is a 65 year old gentleman, longstanding history of smoking, current smoker, multiple small pulmonary nodules, largest at 12 mm, concern for other additional satellite lesions within the chest, enlarged subcarinal adenopathy as well as MRI of the brain with 2 small punctate lesions concerning for metastasis.  Overall imaging and history concerning for an advanced stage bronchogenic carcinoma.   Discussion: Today we discussed the risk benefits and alternatives of proceeding with tissue biopsy. Patient will need a video bronchoscopy with endobronchial ultrasound and transbronchial needle aspirations of the subcarinal adenopathy. We discussed the risk of bleeding as well as pneumothorax. Patient is agreeable to proceed.  All questions were answered prior to the procedure. Once we have a adequate tissue diagnosis referrals will be placed for  medical oncology. Patient already has a PET scan that has been scheduled.  We appreciate the consultation.   Current Facility-Administered Medications:    chlorhexidine (PERIDEX) 0.12 % solution, , , ,    lactated ringers infusion, , Intravenous, Continuous, Stoltzfus, March Rummage, DO   Garner Nash, DO [facility] 04/05/2021 7:08 AM

## 2021-04-06 ENCOUNTER — Other Ambulatory Visit: Payer: Self-pay | Admitting: *Deleted

## 2021-04-06 ENCOUNTER — Encounter: Payer: Self-pay | Admitting: *Deleted

## 2021-04-06 ENCOUNTER — Telehealth: Payer: Self-pay | Admitting: *Deleted

## 2021-04-06 ENCOUNTER — Encounter (HOSPITAL_COMMUNITY): Payer: Self-pay | Admitting: Pulmonary Disease

## 2021-04-06 ENCOUNTER — Other Ambulatory Visit: Payer: Self-pay | Admitting: Radiation Therapy

## 2021-04-06 NOTE — Telephone Encounter (Signed)
Per Dr. Tammi Klippel, I called Bradley Goodman to schedule him to see Dr. Julien Nordmann. I called but was unable to reach. I did leave vm message with my name and phone number to call.

## 2021-04-06 NOTE — Progress Notes (Signed)
The proposed treatment discussed in cancer conference is for discussion purpose only and is not a binding recommendation. The patient was not physically examined nor present for their treatment options. Therefore, final treatment plans cannot be decided.  ?

## 2021-04-07 ENCOUNTER — Other Ambulatory Visit: Payer: Self-pay

## 2021-04-07 ENCOUNTER — Telehealth: Payer: Self-pay | Admitting: *Deleted

## 2021-04-07 ENCOUNTER — Other Ambulatory Visit: Payer: Self-pay | Admitting: Radiation Therapy

## 2021-04-07 ENCOUNTER — Ambulatory Visit (HOSPITAL_COMMUNITY)
Admission: RE | Admit: 2021-04-07 | Discharge: 2021-04-07 | Disposition: A | Discharge status: Home / Self Care | Payer: 59 | Source: Ambulatory Visit | Attending: Radiation Oncology | Admitting: Radiation Oncology

## 2021-04-07 DIAGNOSIS — E785 Hyperlipidemia, unspecified: Secondary | ICD-10-CM | POA: Diagnosis not present

## 2021-04-07 DIAGNOSIS — I10 Essential (primary) hypertension: Secondary | ICD-10-CM | POA: Diagnosis not present

## 2021-04-07 DIAGNOSIS — E119 Type 2 diabetes mellitus without complications: Secondary | ICD-10-CM | POA: Diagnosis not present

## 2021-04-07 DIAGNOSIS — C3411 Malignant neoplasm of upper lobe, right bronchus or lung: Secondary | ICD-10-CM | POA: Diagnosis not present

## 2021-04-07 DIAGNOSIS — C3490 Malignant neoplasm of unspecified part of unspecified bronchus or lung: Secondary | ICD-10-CM | POA: Diagnosis not present

## 2021-04-07 DIAGNOSIS — R590 Localized enlarged lymph nodes: Secondary | ICD-10-CM | POA: Diagnosis not present

## 2021-04-07 DIAGNOSIS — C7931 Secondary malignant neoplasm of brain: Secondary | ICD-10-CM

## 2021-04-07 DIAGNOSIS — R911 Solitary pulmonary nodule: Secondary | ICD-10-CM

## 2021-04-07 DIAGNOSIS — F17210 Nicotine dependence, cigarettes, uncomplicated: Secondary | ICD-10-CM | POA: Diagnosis not present

## 2021-04-07 DIAGNOSIS — Z8249 Family history of ischemic heart disease and other diseases of the circulatory system: Secondary | ICD-10-CM | POA: Diagnosis not present

## 2021-04-07 LAB — GLUCOSE, CAPILLARY: Glucose-Capillary: 120 mg/dL — ABNORMAL HIGH (ref 70–99)

## 2021-04-07 LAB — CYTOLOGY - NON PAP

## 2021-04-07 IMAGING — CT NM PET TUM IMG INITIAL (PI) SKULL BASE T - THIGH
7 series · 25 of 25 positions shown · non-contrast
Comparison: CT chest, abdomen and pelvis [DATE]

CLINICAL DATA: Initial treatment strategy for Lung cancer.

EXAM:
NUCLEAR MEDICINE PET SKULL BASE TO THIGH
TECHNIQUE: 8.28 mCi F-18 FDG was injected intravenously. Full-ring PET imaging
was performed from the skull base to thigh after the radiotracer. CT
data was obtained and used for attenuation correction and anatomic
localization.
Fasting blood glucose: 120 mg/dl

[Series 3: pet sk_thigh ac · axial · 5.0mm · 4.07mm/px · z∈[-1156,-204]mm · 6 of 239 slices shown]
[im 1/239]
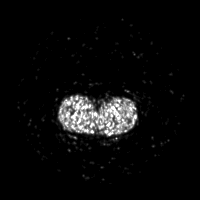
[im 48/239]
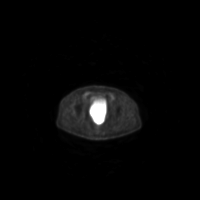
[im 96/239]
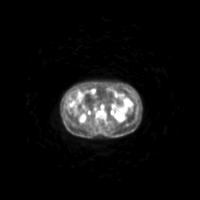
[im 143/239]
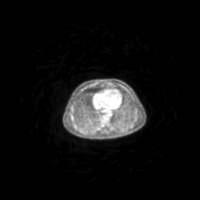
[im 191/239]
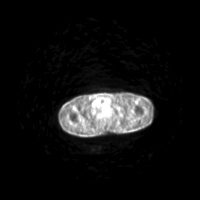
[im 239/239]
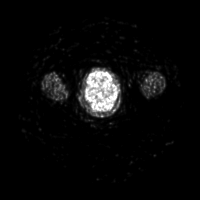

[Series 4: ct sk_thigh 5.0 bf37 · axial · 5.0mm · 0.98mm/px · z∈[-1156,-204]mm · 5 of 239 slices shown]
[im 1/239]
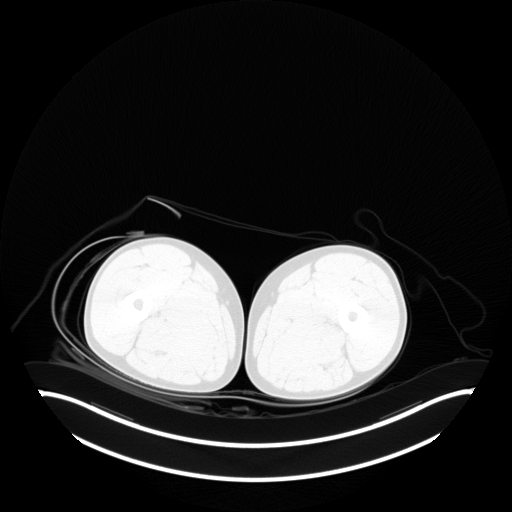
[im 60/239]
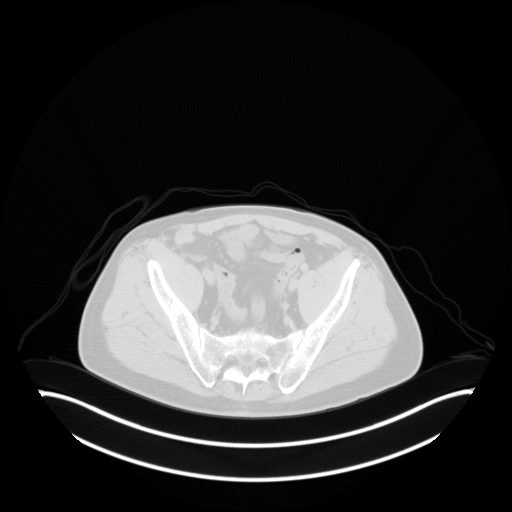
[im 120/239]
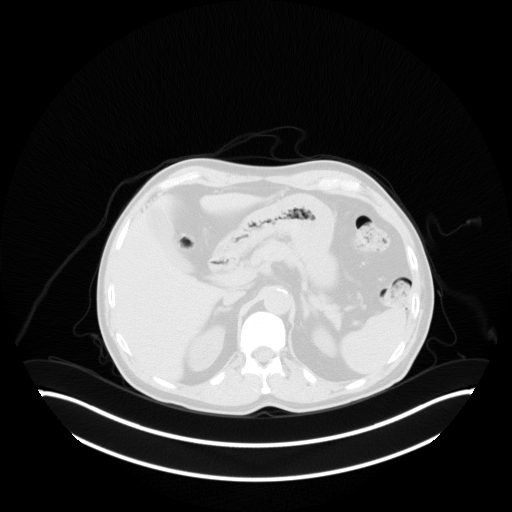
[im 179/239]
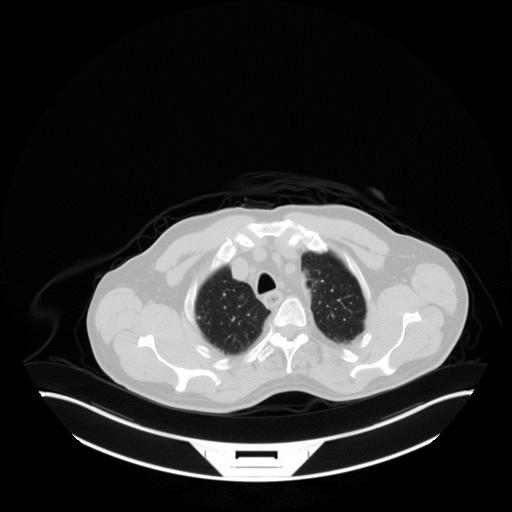
[im 239/239  brain]
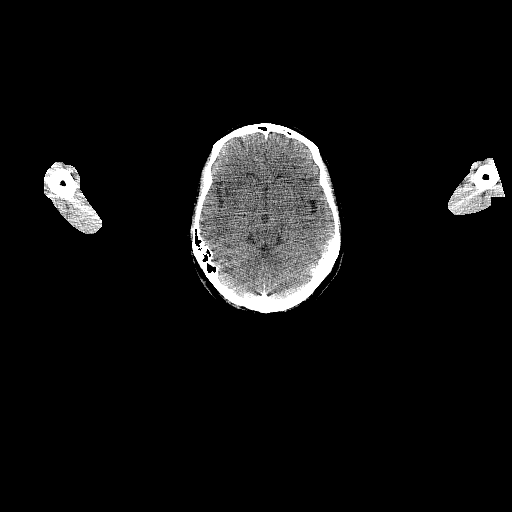

[Series 5: pet sk_thigh nac · axial · 5.0mm · 4.07mm/px · z∈[-1156,-204]mm · 5 of 239 slices shown]
[im 1/239]
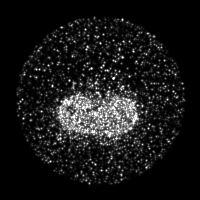
[im 60/239]
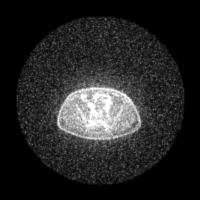
[im 120/239]
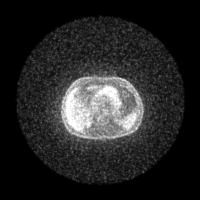
[im 179/239]
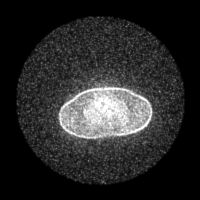
[im 239/239]
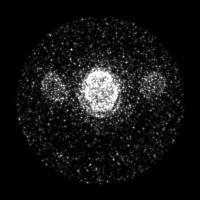

[Series 8: ct sk_thigh 5.0 br59 lung_bone · axial · 5.0mm · 0.66mm/px · z∈[-665,-365]mm · 2 of 76 slices shown]
[im 1/76]
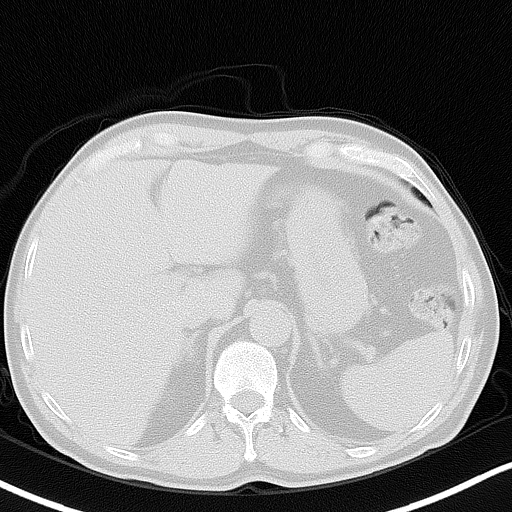
[im 76/76]
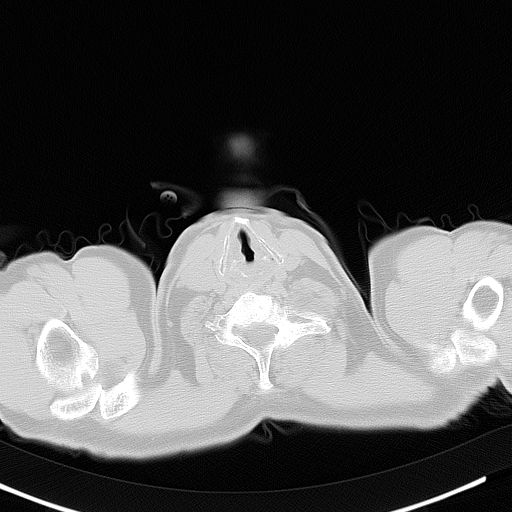

[Series 603: fused cor · 1 of 38 slices shown]
[im 1/38]
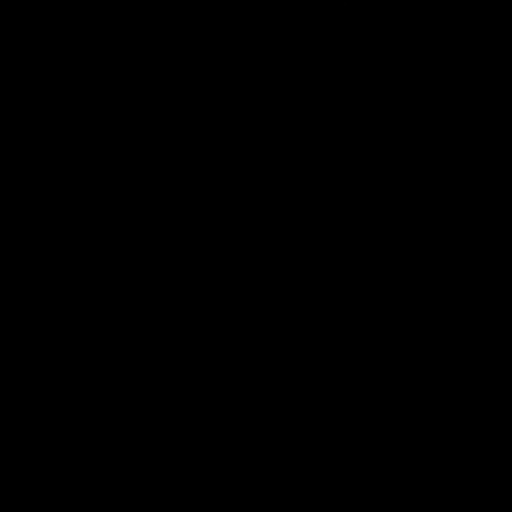

[Series 604: <mip collection> · coronal · 1.98mm/px · 1 of 32 slices shown]
[im 1/32]
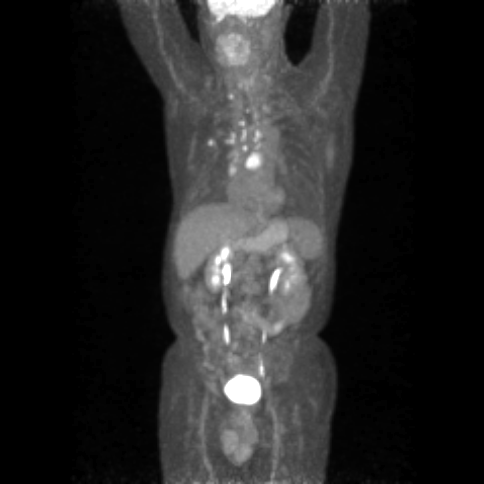

[Series 605: range-ct sk_thigh 5.0 bf37-tra-<alpha range> · 5 of 233 slices shown]
[im 1/233]
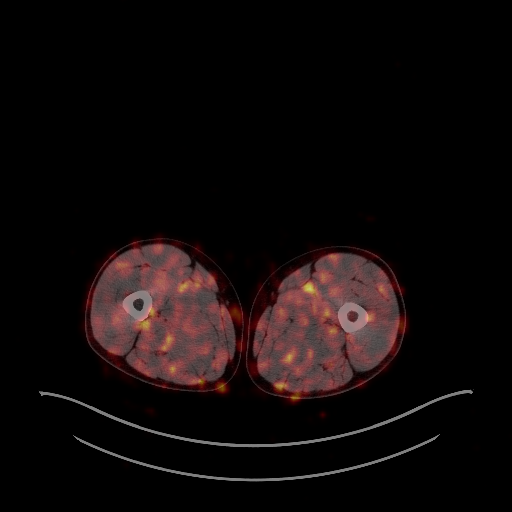
[im 59/233]
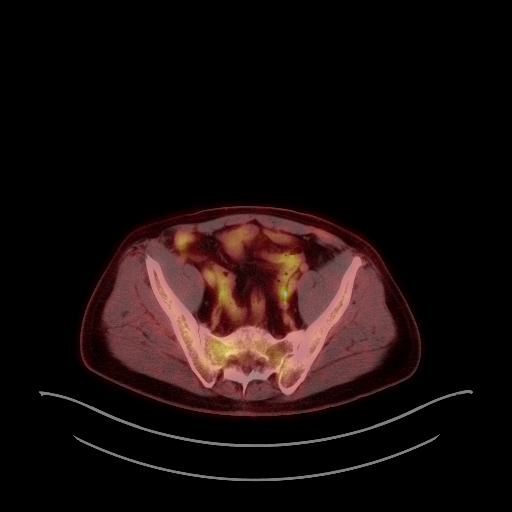
[im 117/233]
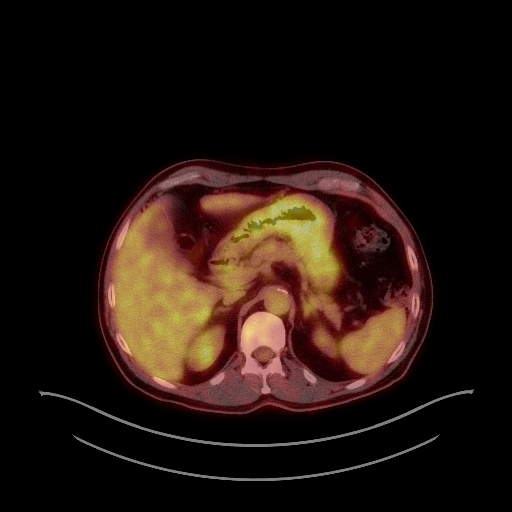
[im 175/233]
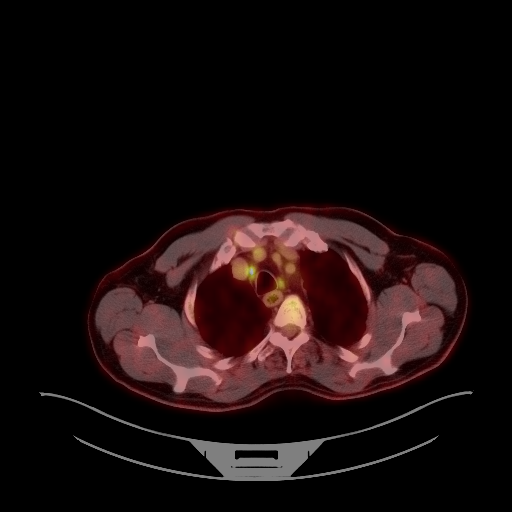
[im 233/233]
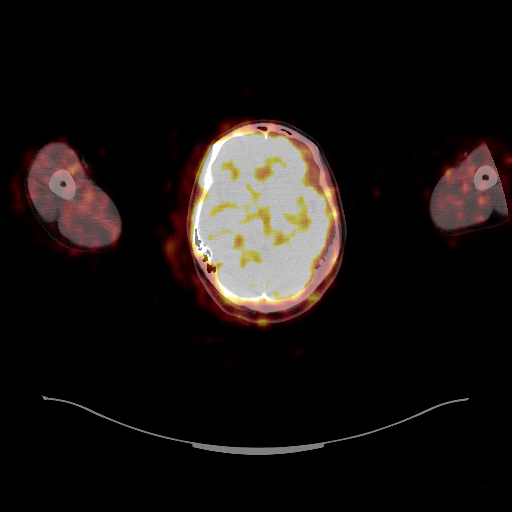

[25 of 25 positions shown; findings below may reference images not displayed]

FINDINGS: Mediastinal blood pool activity: SUV max

Liver activity: SUV max NA

NECK: No hypermetabolic lymph nodes in the neck.

Incidental CT findings: none

CHEST: Within the periphery of the right upper lobe there is a
cm nodule with an SUV max of 7.2, image [DATE]. Additional, smaller
nodules are identified within the right upper lobe which are too
small to characterize, likely representing small satellite nodules.
There is wall thickening with increased FDG uptake involving the
central right upper lobe airways with SUV max of 6.14. Suspicious
for endobronchial spread of tumor.

FDG avid right hilar, subcarinal, bilateral mediastinal and
bilateral supraclavicular lymph nodes concerning for metastatic
adenopathy:

SUV max within the right hilum is equal to 5.99.

Subcarinal lymph node measures 2 cm short axis with SUV max 12.57,
image 82/4.

Low left paratracheal lymph node measures 0.7 cm within SUV max of
8.4, image 68/4.

Left supraclavicular lymph node measures 0.6 cm within SUV max of
3.78, image 52/.

Right supraclavicular lymph node measures 0.8 cm within SUV max of
6.74, image 48/4.

Incidental CT findings: Aortic atherosclerosis and coronary artery
calcifications.

ABDOMEN/PELVIS: No abnormal FDG uptake identified within the liver,
spleen, or adrenal glands. No FDG avid abdominopelvic lymph. Small
low attenuation structure within the inferior right hepatic lobe is
too small to characterize measuring 7 mm. No FDG uptake identified
within this structure.

Incidental CT findings: Nonobstructing left renal calculi measures 8
mm, image 130/. Aortic atherosclerosis.

SKELETON: There are 2 suspicious foci of increased uptake within the
right sacrum. SUV equal to 3.97 and 4.0. No convincing corresponding
abnormality noted on the CT images.

Incidental CT findings: none
IMPRESSION: 1. Right upper lobe lung nodule is FDG avid and concerning for
primary bronchogenic carcinoma. Several too small to characterize
satellite nodules are also noted within the right upper lobe. FDG
avid bronchial wall thickening within the central right upper lobe
airways concerning for endobronchial spread of tumor.
2. FDG avid right hilar, subcarinal, bilateral mediastinal, and
bilateral supraclavicular lymph nodes compatible with nodal
metastasis. If tissue sampling is clinically desired the right
supraclavicular lymph node may be amendable to percutaneous biopsy
under ultrasound guidance.
3. There are 2 suspicious foci of increased uptake within the right
sacrum concerning for osseous metastasis. No corresponding CT
abnormality identified this time

## 2021-04-07 MED ORDER — FLUDEOXYGLUCOSE F - 18 (FDG) INJECTION
8.2800 | Freq: Once | INTRAVENOUS | Status: AC
  Administered 2021-04-07: 8.28 via INTRAVENOUS

## 2021-04-07 NOTE — Telephone Encounter (Signed)
I called to schedule an appt with Dr. Julien Nordmann. I was unable to reach but did leave vm message with my name and phone number to call.

## 2021-04-11 ENCOUNTER — Telehealth: Payer: Self-pay | Admitting: *Deleted

## 2021-04-11 ENCOUNTER — Encounter: Payer: Self-pay | Admitting: *Deleted

## 2021-04-11 NOTE — Telephone Encounter (Signed)
I called to schedule Bradley Goodman. I was again, unable to reach him but did leave a vm message with my name and phone number to call.

## 2021-04-11 NOTE — Progress Notes (Signed)
I contacted pathology dept to see if there is enough tissue for molecular testing. Wait for response.

## 2021-04-12 ENCOUNTER — Telehealth: Payer: Self-pay | Admitting: *Deleted

## 2021-04-12 ENCOUNTER — Encounter: Payer: Self-pay | Admitting: Radiation Oncology

## 2021-04-12 NOTE — Telephone Encounter (Signed)
I called Mr. Cullars again to schedule with Dr. Julien Nordmann. I was unable to reach but did leave vm message with my name and phon number to call.

## 2021-04-13 NOTE — Interval H&P Note (Signed)
History and Physical Interval Note:  04/13/2021 7:26 AM  Bradley Goodman  has presented today for surgery, with the diagnosis of Adenopathy.  The various methods of treatment have been discussed with the patient and family. After consideration of risks, benefits and other options for treatment, the patient has consented to  Procedure(s): VIDEO BRONCHOSCOPY WITH ENDOBRONCHIAL ULTRASOUND (N/A) BRONCHIAL NEEDLE ASPIRATION BIOPSIES as a surgical intervention.  The patient's history has been reviewed, patient examined, no change in status, stable for surgery.  I have reviewed the patient's chart and labs.  Questions were answered to the patient's satisfaction.     [name]

## 2021-04-14 NOTE — Progress Notes (Signed)
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 During the first, he had a brain MRI which apparently showed 2 brain metastases with a pituitary lesion and a right parietal lesion.  Subsequent chest CT during his second visit to the emergency department showed a right upper lung nodule measuring 12 mm suspicious for malignancy with mediastinal and subcarinal lymphadenopathy.  MRI Brain w/ & w/o Contrast 04/17/2021 --IMPRESSION: 1. Increased 7 mm metastasis in the right parietal lobe metastasis (previously 5 mm) and 5 mm metastasis in the left cerebellum (previously punctate). Increased associated edema without significant mass effect. 2. New punctate focus of enhancement in the left parietal lobe (series 12, image 112), which is suspicious for new punctate metastasis. 3. New punctate focus of enhancement in the high anterior right frontal lobe (series 12, image 127), which could represent a punctate metastasis versus cortical vessel. 4. No substantial change in abnormal enhancement involving the pituitary infundibulum, hypothalamus, and posterior pituitary, which was further evaluated on prior postcontrast pituitary protocol imaging from June 21, 22. Please see that study for differential considerations. 5. Multiple small foci of enhancement in the calvarium, which are not specific but could represent early osseous metastases. Recommend attention on follow-up.  PET Scan 04/07/2021 --IMPRESSION: 1. Right upper lobe lung nodule is FDG avid and concerning for primary bronchogenic carcinoma. Several too small to characterize satellite nodules are also noted within the right upper lobe. FDG avid bronchial wall thickening within the central right upper lobe airways concerning for endobronchial spread of tumor. 2. FDG avid right hilar, subcarinal, bilateral  mediastinal, and bilateral supraclavicular lymph nodes compatible with nodal metastasis. If tissue sampling is clinically desired the right supraclavicular lymph node may be amendable to percutaneous biopsy under ultrasound guidance. 3. There are 2 suspicious foci of increased uptake within the right sacrum concerning for osseous metastasis. No corresponding CT abnormality identified this time  Biopsies revealed:  04/05/2021 FINAL MICROSCOPIC DIAGNOSIS:  A. 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 Denies any smokeless tobacco or recreational drug use. Reports very rare alcohol consumption (~1 drink a week)  Past/Anticipated interventions by cardiothoracic surgery, if any:  04/05/2021 Dr. Leory Plowman Icard Flexible video fiberoptic bronchoscopy with endobronchial ultrasound and biopsies  Past/Anticipated interventions by medical oncology, if any:  Has been referred to Dr. Curt Bears (lung navigator having trouble reaching patient to schedule appointment)  Signs/Symptoms Weight changes, if any: Patient denies, but does report a decrease in appetite Respiratory complaints, if any: Reports having to clear his throat through out the day (occasional cough), but otherwise denies any concerns Hemoptysis, if any: Patient denies Pain issues, if any:  Denies anything new; continues to deal with neck pain from persistent cervical disc issues  Dose of Decadron, if applicable: Not ordered currently  Recent neurologic symptoms, if any:  Seizures: Patient denies Headaches: Reports intermittent migraines (causes light sensitivity and occasional visual disturbances--blurry vision and aura) Nausea: Patient denies  Dizziness/ataxia: Reports occasional dizziness or feeling off balance when he stands to ambulate (denies any falls) Difficulty with  hand coordination: Patient denies Focal numbness/weakness: Patient denies  Visual deficits/changes: Reports occasional difficulty focusing on an object, or floaters in his vision. Confusion/Memory deficits: Patient denies any concerns  SAFETY ISSUES: Prior  radiation? No Pacemaker/ICD? No  Possible current pregnancy? N/A Is the patient on methotrexate? No  Current Complaints / other details:  Received the first 3 Moderna vaccines

## 2021-04-17 ENCOUNTER — Ambulatory Visit
Admission: RE | Admit: 2021-04-17 | Discharge: 2021-04-17 | Disposition: A | Discharge status: Home / Self Care | Payer: 59 | Source: Ambulatory Visit | Attending: Radiation Oncology | Admitting: Radiation Oncology

## 2021-04-17 ENCOUNTER — Encounter: Payer: Self-pay | Admitting: *Deleted

## 2021-04-17 ENCOUNTER — Other Ambulatory Visit (HOSPITAL_COMMUNITY): Payer: Self-pay

## 2021-04-17 ENCOUNTER — Other Ambulatory Visit: Payer: 59

## 2021-04-17 ENCOUNTER — Telehealth: Payer: Self-pay | Admitting: *Deleted

## 2021-04-17 DIAGNOSIS — C719 Malignant neoplasm of brain, unspecified: Secondary | ICD-10-CM | POA: Diagnosis not present

## 2021-04-17 DIAGNOSIS — C7931 Secondary malignant neoplasm of brain: Secondary | ICD-10-CM | POA: Diagnosis not present

## 2021-04-17 DIAGNOSIS — C7989 Secondary malignant neoplasm of other specified sites: Secondary | ICD-10-CM

## 2021-04-17 IMAGING — MR MR HEAD WO/W CM
12 series · 48 of 48 positions shown · IV contrast (multihance)
Comparison: [DATE] MRI (postcontrast imaging performed on [DATE]).

CLINICAL DATA: Brain/CNS neoplasm.

EXAM:
MRI HEAD WITHOUT AND WITH CONTRAST
TECHNIQUE: Multiplanar, multiecho pulse sequences of the brain and surrounding
structures were obtained without and with intravenous contrast.
CONTRAST:  15mL MULTIHANCE GADOBENATE DIMEGLUMINE 529 MG/ML IV SOLN

[Series 2: FLAIR · sagittal · 3.0mm · 0.75mm/px · 3 of 39 slices shown (1 of 2)]
[im 1/39]
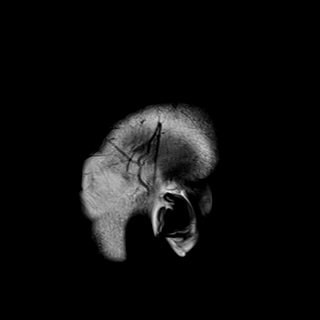
[im 20/39]
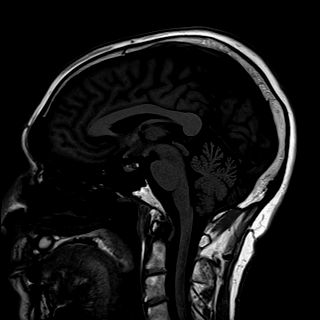
[im 39/39]
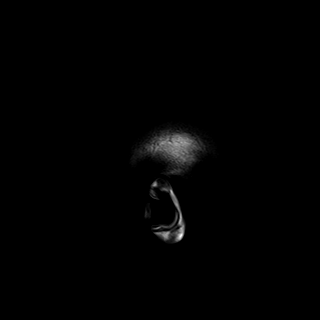

[Series 3: DWI · axial · 3.0mm · 1.50mm/px · z∈[-57,+91]mm · 4 of 78 slices shown (1 of 2)]
[im 1/78]
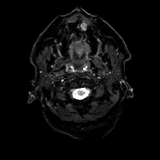
[im 26/78]
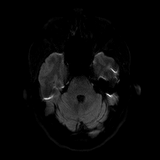
[im 52/78]
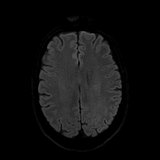
[im 78/78]
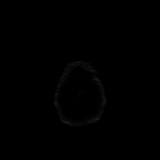

[Series 4: DWI · axial · 3.0mm · 1.50mm/px · z∈[-57,+91]mm · 2 of 38 slices shown (2 of 2)]
[im 1/38]
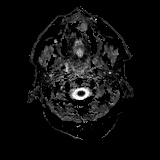
[im 38/38]
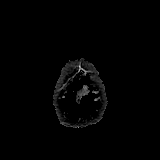

[Series 5: T2 · axial · 5.0mm · 0.60mm/px · 1 of 27 slices shown (1 of 2)]
[im 1/27]
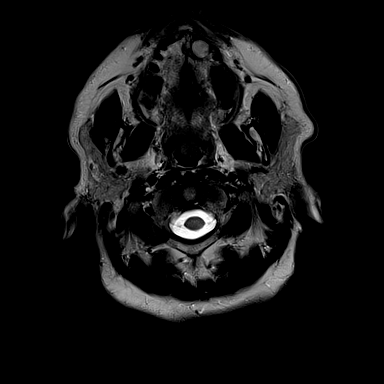

[Series 6: swi_images · axial · 1.7mm · 0.90mm/px · z∈[-64,+98]mm · 5 of 96 slices shown]
[im 1/96]
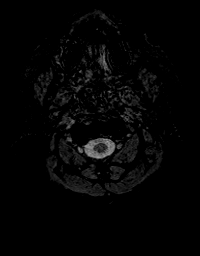
[im 24/96]
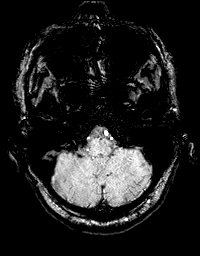
[im 48/96]
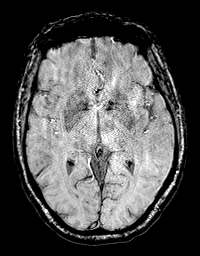
[im 72/96]
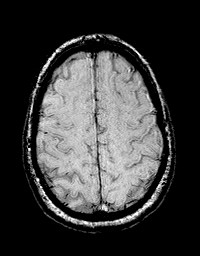
[im 96/96]
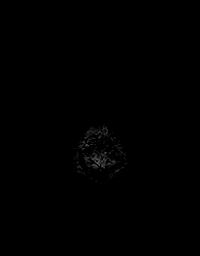

[Series 8: FLAIR · axial · 3.0mm · 0.90mm/px · z∈[-79,+107]mm · 3 of 63 slices shown (2 of 2)]
[im 1/63]
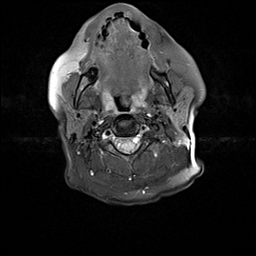
[im 32/63]
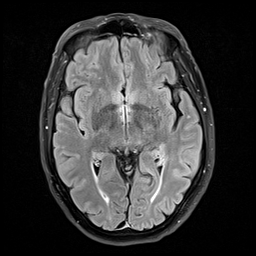
[im 63/63]
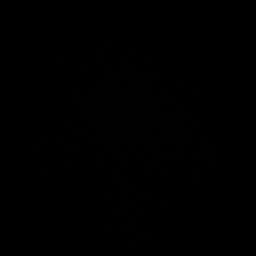

[Series 9: T2 · axial · non-contrast · 1.0mm · 0.90mm/px · z∈[-61,+95]mm · 8 of 160 slices shown (2 of 2)]
[im 1/160]
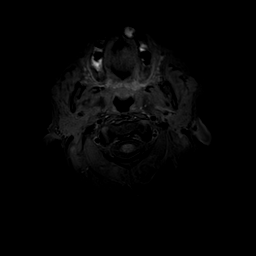
[im 23/160]
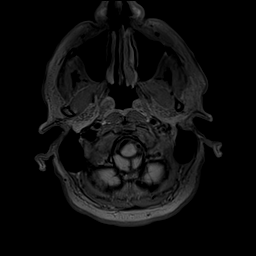
[im 46/160]
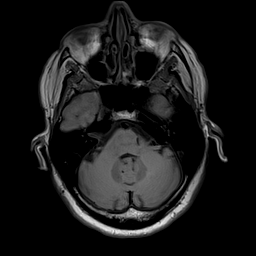
[im 69/160]
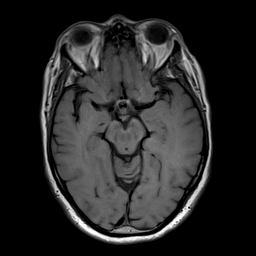
[im 91/160]
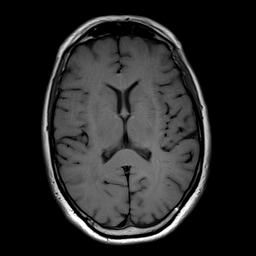
[im 114/160]
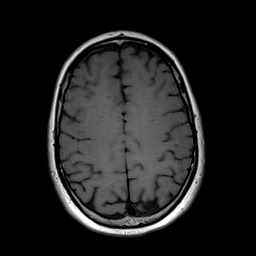
[im 137/160]
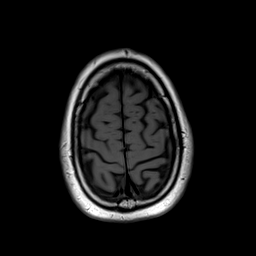
[im 160/160]
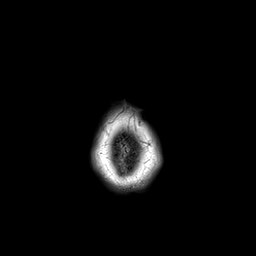

[Series 10: T2 post-contrast · coronal · 3.0mm · 0.57mm/px · 2 of 47 slices shown (1 of 2)]
[im 1/47]
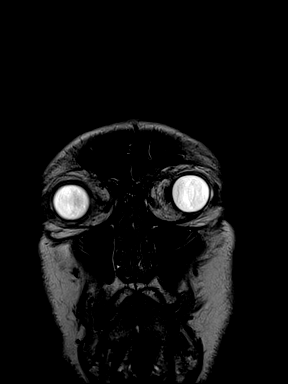
[im 47/47]
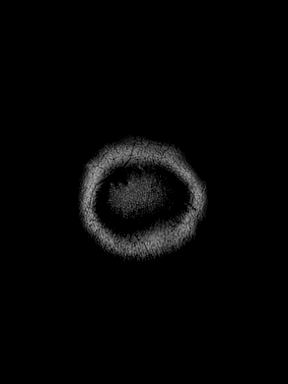

[Series 11: T2 post-contrast · axial · 1.0mm · 0.90mm/px · z∈[-61,+95]mm · 8 of 160 slices shown (2 of 2)]
[im 1/160]
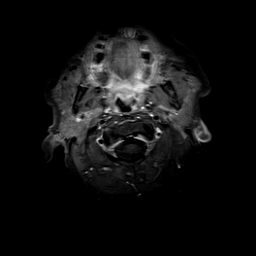
[im 23/160]
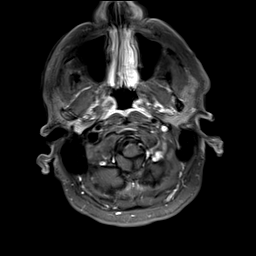
[im 46/160]
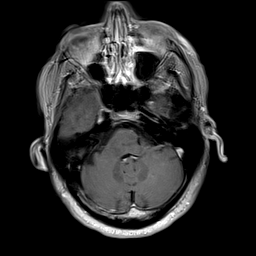
[im 69/160]
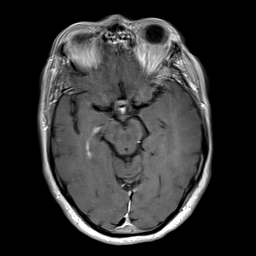
[im 91/160]
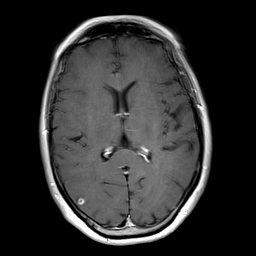
[im 114/160]
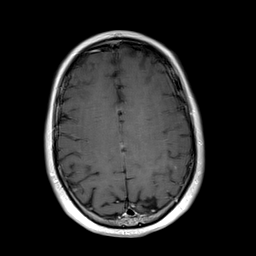
[im 137/160]
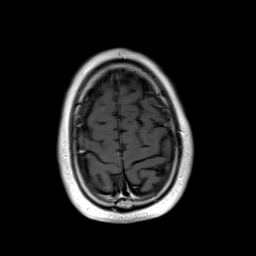
[im 160/160]
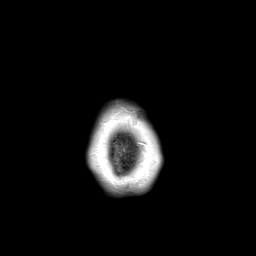

[Series 12: T1 post-contrast · axial · 1.0mm · 0.75mm/px · z∈[-62,+97]mm · 8 of 160 slices shown (1 of 2)]
[im 1/160]
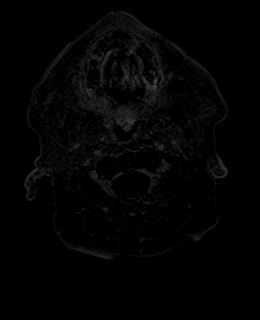
[im 23/160]
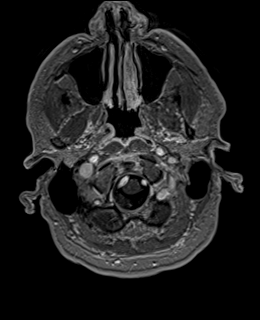
[im 46/160]
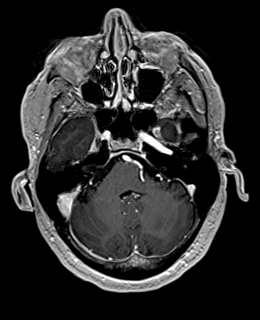
[im 69/160]
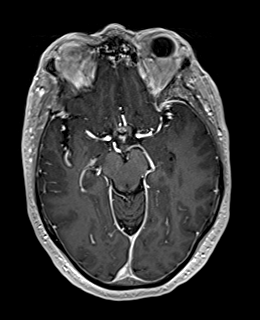
[im 91/160]
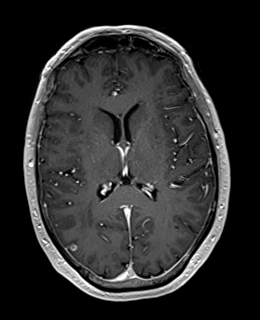
[im 114/160]
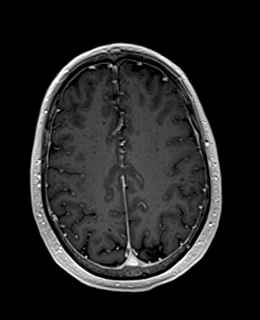
[im 137/160]
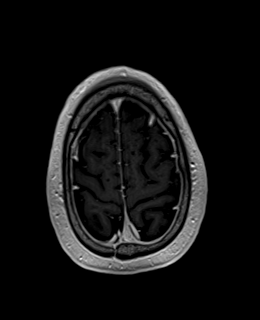
[im 160/160]
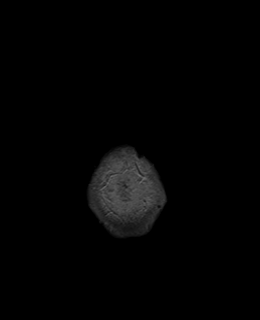

[Series 13: T1 post-contrast · coronal · 3.0mm · 0.57mm/px · 2 of 47 slices shown (2 of 2)]
[im 1/47]
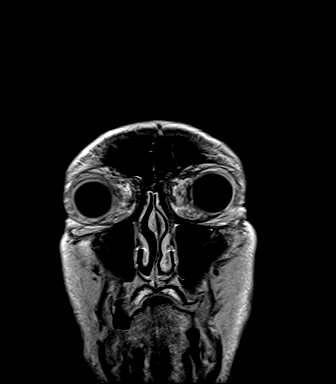
[im 47/47]
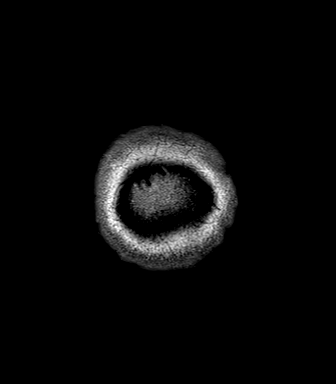

[Series 14: FLAIR post-contrast · sagittal · 3.0mm · 0.75mm/px · 2 of 39 slices shown]
[im 1/39]
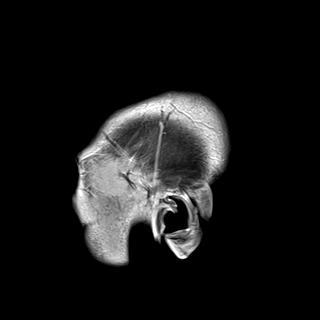
[im 39/39]
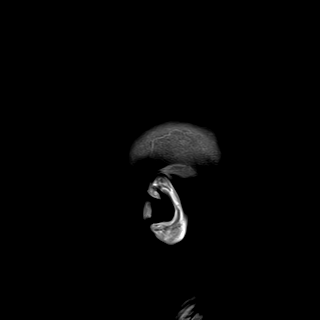

[48 of 48 positions shown; findings below may reference images not displayed]

FINDINGS: Brain: Slight increase in size of a 7 mm peripherally enhancing
metastatic lesion in the right parietal lobe (series 12, image 93),
previously 5 mm. Interval increase in size of a 5 mm enhancing
metastasis in the left cerebellum (series 12, image 49), previously
punctate. Increased associated edema without significant mass
effect.

New punctate focus of enhancement in the high anterior right frontal
lobe (series 12, image 127) and the left parietal lobe (series 12,
image 112; series 14, image 31)

No substantial change in abnormal enhancement involving the
pituitary infundibulum, hypothalamus, and posterior pituitary, which
was further evaluated on prior postcontrast pituitary protocol
imaging from [DATE].

No acute infarct, acute hemorrhage, hydrocephalus, midline shift,
basal cistern effacement, or extra-axial fluid collection.

Moderate scattered T2/FLAIR hyperintensities within the white
matter, nonspecific but most likely related to chronic microvascular
ischemic disease.

Vascular: Major arterial flow voids are maintained at the skull
base.

Skull and upper cervical spine: Scattered small foci of enhancement
in the calvarium (for example, series 12, images 93, 114 and 134 and
the left posterior calvarium) with very faint FLAIR hyperintensity
in these regions.

Sinuses/Orbits: Mild ethmoid air cell mucosal thickening.
Unremarkable orbits.

Other: No mastoid effusions.
IMPRESSION: 1. Increased 7 mm metastasis in the right parietal lobe metastasis
(previously 5 mm) and 5 mm metastasis in the left cerebellum
(previously punctate). Increased associated edema without
significant mass effect.
2. New punctate focus of enhancement in the left parietal lobe
(series 12, image 112), which is suspicious for new punctate
metastasis.
3. New punctate focus of enhancement in the high anterior right
frontal lobe (series 12, image 127), which could represent a
punctate metastasis versus cortical vessel.
4. No substantial change in abnormal enhancement involving the
pituitary infundibulum, hypothalamus, and posterior pituitary, which
was further evaluated on prior postcontrast pituitary protocol
imaging from [DATE]. Please see that study for differential
considerations.
5. Multiple small foci of enhancement in the calvarium, which are
not specific but could represent early osseous metastases. Recommend
attention on follow-up.

## 2021-04-17 MED ORDER — GADOBENATE DIMEGLUMINE 529 MG/ML IV SOLN
15.0000 mL | Freq: Once | INTRAVENOUS | Status: AC | PRN
  Administered 2021-04-17: 15 mL via INTRAVENOUS

## 2021-04-17 NOTE — Telephone Encounter (Signed)
I called patient to schedule Bradley Goodman with Dr. Julien Nordmann. I was able to reach and give Bradley Goodman an appt time and date. He verbalized understanding of appt.

## 2021-04-17 NOTE — Progress Notes (Signed)
Pathology updated on molecular and PDL 1 testing to be completed.

## 2021-04-18 ENCOUNTER — Encounter: Payer: Self-pay | Admitting: Radiation Oncology

## 2021-04-18 ENCOUNTER — Other Ambulatory Visit: Payer: Self-pay

## 2021-04-18 ENCOUNTER — Ambulatory Visit
Admission: RE | Admit: 2021-04-18 | Discharge: 2021-04-18 | Disposition: A | Discharge status: Home / Self Care | Payer: 59 | Source: Ambulatory Visit | Attending: Radiation Oncology | Admitting: Radiation Oncology

## 2021-04-18 ENCOUNTER — Other Ambulatory Visit (HOSPITAL_COMMUNITY): Payer: Self-pay

## 2021-04-18 ENCOUNTER — Ambulatory Visit
Admission: RE | Admit: 2021-04-18 | Discharge: 2021-04-18 | Disposition: A | Discharge status: Home / Self Care | Payer: 59 | Source: Ambulatory Visit | Attending: Urology | Admitting: Urology

## 2021-04-18 ENCOUNTER — Ambulatory Visit: Payer: 59

## 2021-04-18 VITALS — BP 134/91 | HR 102 | Temp 97.3°F | Resp 20 | Wt 162.2 lb

## 2021-04-18 DIAGNOSIS — C7931 Secondary malignant neoplasm of brain: Secondary | ICD-10-CM | POA: Insufficient documentation

## 2021-04-18 DIAGNOSIS — F17210 Nicotine dependence, cigarettes, uncomplicated: Secondary | ICD-10-CM | POA: Insufficient documentation

## 2021-04-18 DIAGNOSIS — C3411 Malignant neoplasm of upper lobe, right bronchus or lung: Secondary | ICD-10-CM | POA: Insufficient documentation

## 2021-04-18 DIAGNOSIS — Z510 Encounter for antineoplastic radiation therapy: Secondary | ICD-10-CM | POA: Insufficient documentation

## 2021-04-18 DIAGNOSIS — C3491 Malignant neoplasm of unspecified part of right bronchus or lung: Secondary | ICD-10-CM

## 2021-04-18 MED ORDER — SODIUM CHLORIDE 0.9% FLUSH
10.0000 mL | Freq: Once | INTRAVENOUS | Status: AC
Start: 2021-04-18 — End: 2021-04-18
  Administered 2021-04-18: 10 mL via INTRAVENOUS

## 2021-04-18 NOTE — Progress Notes (Signed)
  Radiation Oncology         [phone] ________________________________  Name: Bradley Goodman MRN: [id number]  Date: 04/18/2021  DOB: 15-Jan-1956  SIMULATION AND TREATMENT PLANNING NOTE    ICD-10-CM   1. 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  NARRATIVE:  The patient was brought to the [facility].  Identity was confirmed.  All relevant records and images related to the planned course of therapy were reviewed.  The patient freely provided informed written consent to proceed with treatment after reviewing the details related to the planned course of therapy. The consent form was witnessed and verified by the simulation staff. Intravenous access was established for contrast administration. Then, the patient was set-up in a stable reproducible supine position for radiation therapy.  A relocatable thermoplastic stereotactic head frame was fabricated for precise immobilization.  CT images were obtained.  Surface markings were placed.  The CT images were loaded into the planning software and fused with the patient's targeting MRI scan.  Then the target and avoidance structures were contoured.  Treatment planning then occurred.  The radiation prescription was entered and confirmed.  I have requested 3D planning  I have requested a DVH of the following structures: Brain stem, brain, left eye, right eye, lenses, optic chiasm, target volumes, uninvolved brain, and normal tissue.    SPECIAL TREATMENT PROCEDURE:  The planned course of therapy using radiation constitutes a special treatment procedure. Special care is required in the management of this patient for the following reasons. This treatment constitutes a Special Treatment Procedure for the following reason: High dose per fraction requiring special monitoring for increased toxicities of treatment including daily imaging.  The  special nature of the planned course of radiotherapy will require increased physician supervision and oversight to ensure patient's safety with optimal treatment outcomes.  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  ________________________________  Sheral Apley Tammi Klippel, M.D.

## 2021-04-18 NOTE — Progress Notes (Signed)
Has armband been applied?  Yes.    Does patient have an allergy to IV contrast dye?: No.   Has patient ever received premedication for IV contrast dye?: No.   Does patient take metformin?: No.  Date of lab work: March 27, 2021 BUN: 11 CR: 0.87 eGFR: >60  IV site: forearm left, condition patent and no redness  Has IV site been added to flowsheet?  Yes.    BP (!) 134/91   Pulse (!) 102   Temp (!) 97.3 F (36.3 C)   Resp 20   Wt 162 lb 3.2 oz (73.6 kg)   SpO2 100%   BMI 22.00 kg/m

## 2021-04-18 NOTE — Progress Notes (Signed)
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    ICD-10-CM   1. Brain metastasis ([place])  C79.31     2. Non-small cell carcinoma of right lung ([facility])  C34.91       HISTORY OF PRESENT ILLNESS: Bradley Goodman is a 65 y.o. male presented to the ED on 03/27/21 with left eyelid dropping and visual disturbances. Code stroke head CT showed nonspecific 6 mm thickening of pituitary stalk. Further evaluation with brain MRI as outpatient the following day showed: 5 mm peripherally-enhancing lesion in right parietal lobe concerning for metastasis, punctate focus of enhancement in left cerebellum, indeterminate; abnormal mass-like enlargement/thickening and mild enhancement of posterior pituitary and infundibulum with small areas of abnormal enhancement in hypothalamus.  He underwent further evaluation with CT C/A/P on 03/29/21 showing: 12 mm RUL pulmonary nodule most consistent with malignancy; multiple additional small nodular density in RUL, likely satellite lesions; subcarinal adenopathy, most consistent with metastatic disease; 8 mm ground-glass nodule in lingula; no evidence of metastatic disease in abdomen or pelvis.  I met with the patient by telephone for consultation on 04/03/21. His case was discussed in our multidisciplinary brain tumor conference. The recommendation was to proceed with additional work up, including PET scan and EBUS.  Biopsy of a station 7 lymph node was performed during EBUS on 04/05/21. Cytology confirmed malignant cells consistent with non-small cell carcinoma.  PET scan was performed on 04/07/21 showing: RUL lung nodule is FDG-avid; several too small to  characterize satellite nodules within RUL; FDG-avid bronchial wall thickening within central RUL; FDG-avid right hilar, subcarinal, bilateral mediastinal, and bilateral supraclavicular lymph nodes; two suspicious foci of increased uptake within right sacrum, without corresponding CT abnormality.  Finally, he underwent repeat brain MRI yesterday, 04/17/21, in anticipation of proceeding with [facility] simulation today. This showed: increased right parietal lobe metastasis (7 mm, previously 5 mm) and 5 mm metastasis in left cerebellum (5 mm, previously punctate), with increased associated edema without significant mass effect; new punctate focus of enhancement in left parietal lobe and in high anterior right frontal lobe; no substantial change in abnormal enhancement involving pituitary infundibulum, hypothalamus, and posterior pituitary; multiple nonspecific small foci of enhancement in calvarium.  PREVIOUS RADIATION THERAPY: No  PAST MEDICAL HISTORY:  Past Medical History:  Diagnosis Date   Cervical spondylolysis    Essential hypertension    GERD (gastroesophageal reflux disease)    History of kidney stones    History of migraine    Hyperlipidemia    PONV (postoperative nausea and vomiting)    Type 2 diabetes mellitus ([place])       PAST SURGICAL HISTORY: Past Surgical History:  Procedure Laterality Date   Bilateral inguinal hernia repair     BRONCHIAL NEEDLE ASPIRATION BIOPSY  04/05/2021   Procedure: BRONCHIAL NEEDLE ASPIRATION BIOPSIES;  Surgeon: Garner Nash, DO;  Location: [place];  Service: Pulmonary;;   COLONOSCOPY  01/23/2012   Procedure: COLONOSCOPY;  Surgeon: Daneil Dolin, MD;  [facility];  Service: Endoscopy;  Laterality: N/A;  9:30 AM   COLONOSCOPY N/A 10/11/2015   Procedure: COLONOSCOPY;  Surgeon: Daneil Dolin, MD;  [facility];  Service: Endoscopy;  Laterality: N/A;  830   COLONOSCOPY N/A 10/14/2019   Procedure: COLONOSCOPY;  Surgeon: Daneil Dolin,  MD;  [facility];  Service: Endoscopy;  Laterality: N/A;  1:45   POLYPECTOMY  10/14/2019   Procedure: POLYPECTOMY;  Surgeon: Daneil Dolin, MD;  [facility];  Service: Endoscopy;;  ascending colon, descending colon   VIDEO BRONCHOSCOPY WITH ENDOBRONCHIAL ULTRASOUND N/A 04/05/2021   Procedure: VIDEO BRONCHOSCOPY WITH ENDOBRONCHIAL ULTRASOUND;  Surgeon: Garner Nash, DO;  Location: [place];  Service: Pulmonary;  Laterality: N/A;    FAMILY HISTORY:  Family History  Problem Relation Age of Onset   Heart attack Mother    Heart attack Father    Heart attack Brother    Colon cancer Neg Hx     SOCIAL HISTORY:  Social History   Socioeconomic History   Marital status: Married    Spouse name: Not on file   Number of children: Not on file   Years of education: Not on file   Highest education level: Not on file  Occupational History   Not on file  Tobacco Use   Smoking status: Every Day    Packs/day: 0.50    Years: 30.00    Pack years: 15.00    Types: Cigarettes    Start date: 10/30/1973   Smokeless tobacco: Never  Vaping Use   Vaping Use: Never used  Substance and Sexual Activity   Alcohol use: Yes    Alcohol/week: 0.0 standard drinks    Comment: One drink every 6 months.   Drug use: No   Sexual activity: Yes  Other Topics Concern   Not on file  Social History Narrative   Not on file   Social Determinants of Health   Financial Resource Strain: Not on file  Food Insecurity: Not on file  Transportation Needs: Not on file  Physical Activity: Not on file  Stress: Not on file  Social Connections: Not on file  Intimate Partner Violence: Not on file    ALLERGIES: Patient has no known allergies.  MEDICATIONS:  Current Outpatient Medications  Medication Sig Dispense Refill   aspirin EC 81 MG tablet Take 81 mg by mouth every evening.     cholecalciferol (VITAMIN D) 1000 UNITS tablet Take 1,000 Units by mouth every evening.     ibuprofen  (ADVIL) 200 MG tablet Take 400 mg by mouth every 8 (eight) hours as needed (pain).     Krill Oil 300 MG CAPS Take 300 mg by mouth every evening.     loratadine (CLARITIN) 10 MG tablet Take 10 mg by mouth every evening.      losartan (COZAAR) 50 MG tablet TAKE 1 AND 1/2 TABLETS BY MOUTH DAILY 135 tablet 1   pantoprazole (PROTONIX) 40 MG tablet TAKE 1 TABLET BY MOUTH EVERY EVENING. 90 tablet 4   rosuvastatin (CRESTOR) 20 MG tablet Take 1 tablet (20 mg total) by mouth daily. 90 tablet 1   venlafaxine XR (EFFEXOR-XR) 75 MG 24 hr capsule Take 1 capsule (75 mg total) by mouth daily with breakfast. 90 capsule 1   No current facility-administered medications for this encounter.    REVIEW OF SYSTEMS:  On review of systems, the patient reports that he is doing well overall. He denies any chest pain, shortness of breath, fevers, chills, night sweats, unintended weight changes. He denies any bowel or bladder disturbances, and denies abdominal pain, nausea or vomiting. He denies any new musculoskeletal  or joint aches or pains. He reports decreased appetite, occasional cough, intermittent migraines, occasional dizziness or feeling off balance, and occasional difficulty focusing on an object. A complete review of systems is obtained and is otherwise negative.    PHYSICAL EXAM:  Wt Readings from Last 3 Encounters:  04/18/21 162 lb 3.2 oz (73.6 kg)  04/05/21 167 lb (75.8 kg)  03/27/21 167 lb (75.8 kg)   Temp Readings from Last 3 Encounters:  04/18/21 (!) 97.3 F (36.3 C)  04/05/21 97.7 F (36.5 C)  03/29/21 98.6 F (37 C) (Axillary)   BP Readings from Last 3 Encounters:  04/18/21 (!) 134/91  04/05/21 123/87  03/29/21 (!) 153/105   Pulse Readings from Last 3 Encounters:  04/18/21 (!) 102  04/05/21 93  03/29/21 70   Pain Assessment Pain Score: 0-No pain/10  In general this is a well appearing man in no acute distress. He's alert and oriented x4 and appropriate throughout the examination.  Cardiopulmonary assessment is negative for acute distress and he exhibits normal effort.     KPS = 100  100 - Normal; no complaints; no evidence of disease. 90   - Able to carry on normal activity; minor signs or symptoms of disease. 80   - Normal activity with effort; some signs or symptoms of disease. 21   - Cares for self; unable to carry on normal activity or to do active work. 60   - Requires occasional assistance, but is able to care for most of his personal needs. 50   - Requires considerable assistance and frequent medical care. 67   - Disabled; requires special care and assistance. 46   - Severely disabled; hospital admission is indicated although death not imminent. 71   - Very sick; hospital admission necessary; active supportive treatment necessary. 10   - Moribund; fatal processes progressing rapidly. 0     - Dead  Karnofsky DA, Abelmann [place], Craver LS and Burchenal JH [phone]) The use of the nitrogen mustards in the palliative treatment of carcinoma: with particular reference to bronchogenic carcinoma Cancer 1 634-56  LABORATORY DATA:  Lab Results  Component Value Date   WBC 12.7 (H) 03/27/2021   HGB 16.0 03/29/2021   HCT 47.0 03/29/2021   MCV 91.6 03/27/2021   PLT 244 03/27/2021   Lab Results  Component Value Date   NA 145 03/29/2021   K 4.0 03/29/2021   CL 108 03/29/2021   CO2 27 03/27/2021   Lab Results  Component Value Date   ALT 35 03/27/2021   AST 37 03/27/2021   ALKPHOS 80 03/27/2021   BILITOT 0.8 03/27/2021     RADIOGRAPHY: MR ANGIO HEAD WO CONTRAST  Result Date: 03/27/2021 CLINICAL DATA:  Neuro deficit, acute stroke suspected possible vision changes. EXAM: MRI HEAD WITHOUT CONTRAST MRA HEAD WITHOUT CONTRAST MRA NECK WITHOUT AND WITH CONTRAST TECHNIQUE: Multiplanar, multi-echo pulse sequences of the brain and surrounding structures were acquired without intravenous contrast. Angiographic images of the Circle of Willis were acquired using MRA technique  without intravenous contrast. Angiographic images of the neck were acquired using MRA technique without and with intravenous contrast. Carotid stenosis measurements (when applicable) are obtained utilizing NASCET criteria, using the distal internal carotid diameter as the denominator. CONTRAST:  23m GADAVIST GADOBUTROL 1 MMOL/ML IV SOLN COMPARISON:  Same day CT head. FINDINGS: MRI HEAD FINDINGS Intermittently motion limited exam. Brain: No acute infarction, hemorrhage, or hydrocephalus. Mild scattered T2/FLAIR hyperintensities within the white matter, nonspecific but most likely related to chronic  microvascular ischemic disease. Vascular: See below. Skull and upper cervical spine: Normal marrow signal. Sinuses/Orbits: Clear sinuses.  Unremarkable orbits. Other: No mastoid effusions. Nonspecific cyst in the left paramidline maxilla. MRA HEAD FINDINGS Mildly motion limited evaluation. Anterior circulation: No large vessel occlusion or proximal hemodynamically significant stenosis. No visible aneurysm. Early left MCA bifurcation. Posterior circulation: No large vessel occlusion or proximal hemodynamically significant stenosis. No visible aneurysm. MRA NECK FINDINGS Limited by venous contamination. Aortic arch: Great vessel origins are patent. Right carotid system: Patent without evidence of significant (greater than 50%) stenosis. Left carotid system: Patent without evidence of significant (greater than 50%) stenosis. Limited visualization of the origin. Vertebral arteries: Right dominant. The small/non dominant left vertebral artery poorly characterized proximally. Right vertebral artery is patent without evidence of hemodynamically significant stenosis. IMPRESSION: MRI: 1. No acute infarct. 2. Redemonstrated masslike thickening of the infundibulum. Recommend pituitary protocol MRI with contrast to further evaluate and exclude a lesion at this site, particularly given the patient's reported vision changes and  proximity to the optic chiasm. 3. Mild chronic microvascular disease. MRA Head: No evidence of large vessel occlusion proximal hemodynamically significant stenosis. MRA neck: 1. Limited by venous timing with poor visualization of the non dominant left vertebral artery proximally. Stenosis is not excluded in this regions and CTA neck could further characterize if clinically indicated. 2. Otherwise, no visible hemodynamically significant stenosis. Electronically Signed   By: Margaretha Sheffield MD   On: 03/27/2021 15:09   MR Angiogram Neck W or Wo Contrast  Result Date: 03/27/2021 CLINICAL DATA:  Neuro deficit, acute stroke suspected possible vision changes. EXAM: MRI HEAD WITHOUT CONTRAST MRA HEAD WITHOUT CONTRAST MRA NECK WITHOUT AND WITH CONTRAST TECHNIQUE: Multiplanar, multi-echo pulse sequences of the brain and surrounding structures were acquired without intravenous contrast. Angiographic images of the Circle of Willis were acquired using MRA technique without intravenous contrast. Angiographic images of the neck were acquired using MRA technique without and with intravenous contrast. Carotid stenosis measurements (when applicable) are obtained utilizing NASCET criteria, using the distal internal carotid diameter as the denominator. CONTRAST:  56m GADAVIST GADOBUTROL 1 MMOL/ML IV SOLN COMPARISON:  Same day CT head. FINDINGS: MRI HEAD FINDINGS Intermittently motion limited exam. Brain: No acute infarction, hemorrhage, or hydrocephalus. Mild scattered T2/FLAIR hyperintensities within the white matter, nonspecific but most likely related to chronic microvascular ischemic disease. Vascular: See below. Skull and upper cervical spine: Normal marrow signal. Sinuses/Orbits: Clear sinuses.  Unremarkable orbits. Other: No mastoid effusions. Nonspecific cyst in the left paramidline maxilla. MRA HEAD FINDINGS Mildly motion limited evaluation. Anterior circulation: No large vessel occlusion or proximal hemodynamically  significant stenosis. No visible aneurysm. Early left MCA bifurcation. Posterior circulation: No large vessel occlusion or proximal hemodynamically significant stenosis. No visible aneurysm. MRA NECK FINDINGS Limited by venous contamination. Aortic arch: Great vessel origins are patent. Right carotid system: Patent without evidence of significant (greater than 50%) stenosis. Left carotid system: Patent without evidence of significant (greater than 50%) stenosis. Limited visualization of the origin. Vertebral arteries: Right dominant. The small/non dominant left vertebral artery poorly characterized proximally. Right vertebral artery is patent without evidence of hemodynamically significant stenosis. IMPRESSION: MRI: 1. No acute infarct. 2. Redemonstrated masslike thickening of the infundibulum. Recommend pituitary protocol MRI with contrast to further evaluate and exclude a lesion at this site, particularly given the patient's reported vision changes and proximity to the optic chiasm. 3. Mild chronic microvascular disease. MRA Head: No evidence of large vessel occlusion proximal hemodynamically significant stenosis. MRA neck:  1. Limited by venous timing with poor visualization of the non dominant left vertebral artery proximally. Stenosis is not excluded in this regions and CTA neck could further characterize if clinically indicated. 2. Otherwise, no visible hemodynamically significant stenosis. Electronically Signed   By: Margaretha Sheffield MD   On: 03/27/2021 15:09   MR BRAIN WO CONTRAST  Result Date: 03/27/2021 CLINICAL DATA:  Neuro deficit, acute stroke suspected possible vision changes. EXAM: MRI HEAD WITHOUT CONTRAST MRA HEAD WITHOUT CONTRAST MRA NECK WITHOUT AND WITH CONTRAST TECHNIQUE: Multiplanar, multi-echo pulse sequences of the brain and surrounding structures were acquired without intravenous contrast. Angiographic images of the Circle of Willis were acquired using MRA technique without intravenous  contrast. Angiographic images of the neck were acquired using MRA technique without and with intravenous contrast. Carotid stenosis measurements (when applicable) are obtained utilizing NASCET criteria, using the distal internal carotid diameter as the denominator. CONTRAST:  70m GADAVIST GADOBUTROL 1 MMOL/ML IV SOLN COMPARISON:  Same day CT head. FINDINGS: MRI HEAD FINDINGS Intermittently motion limited exam. Brain: No acute infarction, hemorrhage, or hydrocephalus. Mild scattered T2/FLAIR hyperintensities within the white matter, nonspecific but most likely related to chronic microvascular ischemic disease. Vascular: See below. Skull and upper cervical spine: Normal marrow signal. Sinuses/Orbits: Clear sinuses.  Unremarkable orbits. Other: No mastoid effusions. Nonspecific cyst in the left paramidline maxilla. MRA HEAD FINDINGS Mildly motion limited evaluation. Anterior circulation: No large vessel occlusion or proximal hemodynamically significant stenosis. No visible aneurysm. Early left MCA bifurcation. Posterior circulation: No large vessel occlusion or proximal hemodynamically significant stenosis. No visible aneurysm. MRA NECK FINDINGS Limited by venous contamination. Aortic arch: Great vessel origins are patent. Right carotid system: Patent without evidence of significant (greater than 50%) stenosis. Left carotid system: Patent without evidence of significant (greater than 50%) stenosis. Limited visualization of the origin. Vertebral arteries: Right dominant. The small/non dominant left vertebral artery poorly characterized proximally. Right vertebral artery is patent without evidence of hemodynamically significant stenosis. IMPRESSION: MRI: 1. No acute infarct. 2. Redemonstrated masslike thickening of the infundibulum. Recommend pituitary protocol MRI with contrast to further evaluate and exclude a lesion at this site, particularly given the patient's reported vision changes and proximity to the optic  chiasm. 3. Mild chronic microvascular disease. MRA Head: No evidence of large vessel occlusion proximal hemodynamically significant stenosis. MRA neck: 1. Limited by venous timing with poor visualization of the non dominant left vertebral artery proximally. Stenosis is not excluded in this regions and CTA neck could further characterize if clinically indicated. 2. Otherwise, no visible hemodynamically significant stenosis. Electronically Signed   By: FMargaretha SheffieldMD   On: 03/27/2021 15:09   MR Brain W Wo Contrast  Result Date: 04/18/2021 CLINICAL DATA:  Brain/CNS neoplasm. EXAM: MRI HEAD WITHOUT AND WITH CONTRAST TECHNIQUE: Multiplanar, multiecho pulse sequences of the brain and surrounding structures were obtained without and with intravenous contrast. CONTRAST:  159mMULTIHANCE GADOBENATE DIMEGLUMINE 529 MG/ML IV SOLN COMPARISON:  June 20/21 MRI (postcontrast imaging performed on June 21). FINDINGS: Brain: Slight increase in size of a 7 mm peripherally enhancing metastatic lesion in the right parietal lobe (series 12, image 93), previously 5 mm. Interval increase in size of a 5 mm enhancing metastasis in the left cerebellum (series 12, image 49), previously punctate. Increased associated edema without significant mass effect. New punctate focus of enhancement in the high anterior right frontal lobe (series 12, image 127) and the left parietal lobe (series 12, image 112; series 14, image 31) No substantial change in  abnormal enhancement involving the pituitary infundibulum, hypothalamus, and posterior pituitary, which was further evaluated on prior postcontrast pituitary protocol imaging from June 21 22. No acute infarct, acute hemorrhage, hydrocephalus, midline shift, basal cistern effacement, or extra-axial fluid collection. Moderate scattered T2/FLAIR hyperintensities within the white matter, nonspecific but most likely related to chronic microvascular ischemic disease. Vascular: Major arterial flow  voids are maintained at the skull base. Skull and upper cervical spine: Scattered small foci of enhancement in the calvarium (for example, series 12, images 93, 114 and 134 and the left posterior calvarium) with very faint FLAIR hyperintensity in these regions. Sinuses/Orbits: Mild ethmoid air cell mucosal thickening. Unremarkable orbits. Other: No mastoid effusions. IMPRESSION: 1. Increased 7 mm metastasis in the right parietal lobe metastasis (previously 5 mm) and 5 mm metastasis in the left cerebellum (previously punctate). Increased associated edema without significant mass effect. 2. New punctate focus of enhancement in the left parietal lobe (series 12, image 112), which is suspicious for new punctate metastasis. 3. New punctate focus of enhancement in the high anterior right frontal lobe (series 12, image 127), which could represent a punctate metastasis versus cortical vessel. 4. No substantial change in abnormal enhancement involving the pituitary infundibulum, hypothalamus, and posterior pituitary, which was further evaluated on prior postcontrast pituitary protocol imaging from June 21, 22. Please see that study for differential considerations. 5. Multiple small foci of enhancement in the calvarium, which are not specific but could represent early osseous metastases. Recommend attention on follow-up. Electronically Signed   By: Margaretha Sheffield MD   On: 04/18/2021 08:02   MR BRAIN W WO CONTRAST  Addendum Date: 03/29/2021   ADDENDUM REPORT: 03/29/2021 12:23 ADDENDUM: Findings also discussed with Dr. Quinn Axe via telephone at 12:14 p.m. Electronically Signed   By: Margaretha Sheffield MD   On: 03/29/2021 12:23   Addendum Date: 03/28/2021   ADDENDUM REPORT: 03/28/2021 17:31 ADDENDUM: Findings discussed with Dr. Reather Converse via telephone at 5:04 p.m. Electronically Signed   By: Margaretha Sheffield MD   On: 03/28/2021 17:31   Result Date: 03/29/2021 CLINICAL DATA:  Pituitary mass follow-up. EXAM: MRI HEAD WITHOUT  AND WITH CONTRAST TECHNIQUE: Multiplanar, multiecho pulse sequences of the brain and surrounding structures were obtained without and with intravenous contrast. CONTRAST:  76m GADAVIST GADOBUTROL 1 MMOL/ML IV SOLN COMPARISON:  MRI March 27, 2021. FINDINGS: Dedicated pituitary protocol was performed to further evaluate the potential pituitary mass seen on prior MRI and CT. In the pituitary, there is abnormal masslike enlargement and mild enhancement of the posterior pituitary and infundibulum which mildly diplaces the normally enhancing pituitary anteriorly inferiorly. The infundibulum and posterior pituitary is heterogeneously enhancing. The abnormally thickened infundibulum contacts and mildly distorts the optic chiasm (series 8, image 8). There are also small areas of patchy abnormal enhancement along the inferior aspect of the hypothalamus (for example series 12, images 5/6). On the whole brain postcontrast imaging there is a small (5 mm) peripherally enhancing lesion in the right parietal lobe (series 14, image 91) that is concerning for a metastasis. Punctate focus of enhancement in the left cerebellum (series 14, image 47) may represent a small metastasis versus a vessel. IMPRESSION: 1. On the whole brain postcontrast imaging there is a small (5 mm) peripherally enhancing lesion in the right parietal lobe that is concerning for a metastasis. Punctate focus of enhancement in the left cerebellum may represent a small metastasis versus a vessel. Recommend evaluation for a primary malignancy (including consideration for a CT chest, abdomen and pelvis).  2. In the pituitary, there is abnormal masslike enlargement/thickening and mild enhancement of the posterior pituitary and infundibulum with small areas of abnormal enhancement in the hypothalamus. The abnormally thickened infundibulum contacts and mildly distorts the optic chiasm. A pituitary metastasis is a consideration given the possible intraparenchymal brain  metastasis described above. Differential also includes inflammatory etiologies (such as IgG4, sarcoidosis, or hypophysitis) and other neoplasms (such as glioma or germinoma). Electronically Signed: By: Margaretha Sheffield MD On: 03/28/2021 16:56   CT CHEST ABDOMEN PELVIS W CONTRAST  Result Date: 03/29/2021 CLINICAL DATA:  65 year old male with neurologic deficit. Concern for malignancy. EXAM: CT CHEST, ABDOMEN, AND PELVIS WITH CONTRAST TECHNIQUE: Multidetector CT imaging of the chest, abdomen and pelvis was performed following the standard protocol during bolus administration of intravenous contrast. CONTRAST:  118m OMNIPAQUE IOHEXOL 300 MG/ML  SOLN COMPARISON:  Chest CT dated 01/17/2017. FINDINGS: CT CHEST FINDINGS Cardiovascular: There is no cardiomegaly. Trace pericardial effusion anterior to the heart. Mild atherosclerotic calcification of the thoracic aorta. No aneurysmal dilatation or dissection. The origins of the great vessels of the aortic arch appear patent as visualized. The central pulmonary arteries are unremarkable. Mediastinum/Nodes: No hilar adenopathy. Subcarinal adenopathy measures 2 cm in short axis, new since the prior CT. The esophagus is grossly unremarkable. There is an 8 mm left thyroid hypodense nodule similar to prior CT. Not clinically significant; no follow-up imaging recommended (ref: J Am Coll Radiol. 2015 Feb;12(2): 143-50).No mediastinal fluid collection. Lungs/Pleura: There is a 12 mm nodule with lobulated margin in the right upper lobe most consistent with malignancy. Several smaller scattered nodules noted in the right upper lobe likely representing tiny satellite lesions. There is an 8 mm ground-glass nodule in the lingula (86/5) previously measuring approximately 5 mm. No lobar consolidation, pleural effusion, or pneumothorax. The central airways are patent. Musculoskeletal: No acute osseous pathology. A small sclerotic focus in the posterior left ninth rib, likely a bone  island. CT ABDOMEN PELVIS FINDINGS No intra-abdominal free air or free fluid. Hepatobiliary: 2 subcentimeter hypodense lesions in the posterior right lobe of the liver are too small to characterize. The liver is otherwise unremarkable. No intrahepatic biliary dilatation. The gallbladder is unremarkable. Pancreas: Unremarkable. No pancreatic ductal dilatation or surrounding inflammatory changes. Spleen: Normal in size without focal abnormality. Adrenals/Urinary Tract: The adrenal glands are unremarkable. There is an 11 mm nonobstructing stone in the upper pole of the left kidney. No hydronephrosis. The right kidney is unremarkable. There is symmetric enhancement and excretion of contrast by both kidneys. The visualized ureters and urinary bladder appear unremarkable. Stomach/Bowel: Several small scattered sigmoid diverticula without active inflammatory changes. There is no bowel obstruction or active inflammation. The appendix is normal. Vascular/Lymphatic: Moderate aortoiliac atherosclerotic disease. The IVC is unremarkable. No portal venous gas. There is no adenopathy. Reproductive: Mildly enlarged prostate gland measuring 5.3 cm in transverse axial diameter. The seminal vesicles are symmetric. Partially visualized small right hydrocele. Inguinal hernia repair plug. Other: None Musculoskeletal: Fragmentation of the anterior right symphysis previous. No acute osseous pathology. No suspicious bone lesions. Degenerative changes of the lower lumbar spine. IMPRESSION: 1. A 12 mm right upper lobe pulmonary nodule most consistent with malignancy. Multiple additional small nodular density in the right upper lobe, likely satellite lesions. Multidisciplinary consult including further evaluation with PET-CT recommended. 2. Subcarinal adenopathy, new since the prior CT, most consistent with metastatic disease. 3. A 8 mm ground-glass nodule in the lingula previously measuring approximately 5 mm. 4. No evidence of metastatic  disease in  the abdomen or pelvis. 5. A 11 mm nonobstructing left renal upper pole stone. No hydronephrosis. 6. Sigmoid diverticulosis. No bowel obstruction. Normal appendix. 7. Aortic Atherosclerosis (ICD10-I70.0). Electronically Signed   By: Anner Crete M.D.   On: 03/29/2021 17:04   NM PET Image Initial (PI) Skull Base To Thigh  Result Date: 04/08/2021 CLINICAL DATA:  Initial treatment strategy for Lung cancer. EXAM: NUCLEAR MEDICINE PET SKULL BASE TO THIGH TECHNIQUE: 8.28 mCi F-18 FDG was injected intravenously. Full-ring PET imaging was performed from the skull base to thigh after the radiotracer. CT data was obtained and used for attenuation correction and anatomic localization. Fasting blood glucose: 120 mg/dl COMPARISON:  CT chest, abdomen and pelvis 03/29/2021 FINDINGS: Mediastinal blood pool activity: SUV max 2.82 Liver activity: SUV max NA NECK: No hypermetabolic lymph nodes in the neck. Incidental CT findings: none CHEST: Within the periphery of the right upper lobe there is a 1.1 cm nodule with an SUV max of 7.2, image 31/8. Additional, smaller nodules are identified within the right upper lobe which are too small to characterize, likely representing small satellite nodules. There is wall thickening with increased FDG uptake involving the central right upper lobe airways with SUV max of 6.14. Suspicious for endobronchial spread of tumor. FDG avid right hilar, subcarinal, bilateral mediastinal and bilateral supraclavicular lymph nodes concerning for metastatic adenopathy: SUV max within the right hilum is equal to 5.99. Subcarinal lymph node measures 2 cm short axis with SUV max 12.57, image 82/4. Low left paratracheal lymph node measures 0.7 cm within SUV max of 8.4, image 68/4. Left supraclavicular lymph node measures 0.6 cm within SUV max of 3.78, image 52/. Right supraclavicular lymph node measures 0.8 cm within SUV max of 6.74, image 48/4. Incidental CT findings: Aortic atherosclerosis and  coronary artery calcifications. ABDOMEN/PELVIS: No abnormal FDG uptake identified within the liver, spleen, or adrenal glands. No FDG avid abdominopelvic lymph. Small low attenuation structure within the inferior right hepatic lobe is too small to characterize measuring 7 mm. No FDG uptake identified within this structure. Incidental CT findings: Nonobstructing left renal calculi measures 8 mm, image 130/. Aortic atherosclerosis. SKELETON: There are 2 suspicious foci of increased uptake within the right sacrum. SUV equal to 3.97 and 4.0. No convincing corresponding abnormality noted on the CT images. Incidental CT findings: none IMPRESSION: 1. Right upper lobe lung nodule is FDG avid and concerning for primary bronchogenic carcinoma. Several too small to characterize satellite nodules are also noted within the right upper lobe. FDG avid bronchial wall thickening within the central right upper lobe airways concerning for endobronchial spread of tumor. 2. FDG avid right hilar, subcarinal, bilateral mediastinal, and bilateral supraclavicular lymph nodes compatible with nodal metastasis. If tissue sampling is clinically desired the right supraclavicular lymph node may be amendable to percutaneous biopsy under ultrasound guidance. 3. There are 2 suspicious foci of increased uptake within the right sacrum concerning for osseous metastasis. No corresponding CT abnormality identified this time Electronically Signed   By: Kerby Moors M.D.   On: 04/08/2021 16:17   CT HEAD CODE STROKE WO CONTRAST  Result Date: 03/27/2021 CLINICAL DATA:  Code stroke. Neuro deficit, acute, stroke suspected. Left eye droop and feeling "off." "Eye floaters." EXAM: CT HEAD WITHOUT CONTRAST TECHNIQUE: Contiguous axial images were obtained from the base of the skull through the vertex without intravenous contrast. COMPARISON:  No pertinent prior exams available for comparison. FINDINGS: Brain: Cerebral volume is normal for age.  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 Mild patchy and ill-defined hypoattenuation within the cerebral white matter, nonspecific but compatible with chronic small vessel ischemic disease. There is no acute intracranial hemorrhage. No demarcated cortical infarct. No extra-axial fluid collection. No evidence of intracranial mass. No midline shift. Nonspecific thickening of the pituitary stalk, measuring 6 mm in width. Vascular: No hyperdense vessel.  Atherosclerotic calcifications. Skull: Normal. Negative for fracture or focal lesion. Sinuses/Orbits: Visualized orbits show no acute finding. Trace mucosal thickening within the partially imaged right maxillary sinus. ASPECTS (Chical Stroke Program Early CT Score) - Ganglionic level infarction (caudate, lentiform nuclei, internal capsule, insula, M1-M3 cortex): 7 - Supraganglionic infarction (M4-M6 cortex): 3 Total score (0-10 with 10 being normal): 10 These results were communicated to Dr. Quinn Axe At 12:22 pmon 6/20/2022by text page via the [facility] messaging system. IMPRESSION: No evidence of acute cortical infarct or acute intracranial hemorrhage. 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 Nonspecific thickening of the pituitary stalk, measuring 6 mm in width. A contrast enhanced brain MRI with pituitary protocol is recommended for further characterization, and to exclude a lesion at this site. Mild cerebral white matter chronic small vessel ischemic disease. Electronically Signed   By: Kellie Simmering DO   On: 03/27/2021 12:22      IMPRESSION/PLAN: 25. 65 y.o. 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  At this point, the patient would potentially benefit from radiotherapy. The options include whole brain irradiation versus stereotactic radiosurgery. There are pros and cons associated with each of these potential treatment options. Whole brain radiotherapy would  treat the known metastatic deposits and help provide some reduction of risk for future brain metastases. However, whole brain radiotherapy carries potential risks including hair loss, subacute somnolence, and neurocognitive changes including a possible reduction in short-term memory. Whole brain radiotherapy also may carry a lower likelihood of tumor control at the treatment sites because of the low-dose used. Stereotactic radiosurgery carries a higher likelihood for local tumor control at the targeted sites with lower associated risk for neurocognitive changes such as memory loss. However, the use of stereotactic radiosurgery in this setting may leave the patient at increased risk for new brain metastases elsewhere in the brain as high as 50-60%. Accordingly, patients who receive stereotactic radiosurgery in this setting should undergo ongoing surveillance imaging with brain MRI more frequently in order to identify and treat new small brain metastases before they become symptomatic. Stereotactic radiosurgery does carry some different risks, including a risk of radionecrosis.  PLAN: Today, I reviewed the findings and workup thus far with the patient. We discussed the dilemma regarding whole brain radiotherapy versus stereotactic radiosurgery. We discussed the pros and cons of each. We also discussed the logistics and delivery of each. We reviewed the results associated with each of the treatments described above. The patient seems to understand the treatment options and would like to proceed with stereotactic radiosurgery using single fraction for the intraparenchymal metastases.  The pituitary lesion which is adjacent to the optic chiasm will require a five fraction course to reduce risk for toxicity to the optic pathway.  We also spent some time reviewing his biopsy and staging PET results and their implications.  He will see Dr. Julien Nordmann next week to help establish a care plan.  I personally spent 60 minutes  in this encounter including chart review, reviewing radiological studies, meeting face-to-face with the patient, entering orders and completing documentation.   ------------------------------------------------   Tyler Pita, MD  [place] Director and Director of Stereotactic Radiosurgery Direct Dial: [phone]  Fax: [phone] [email or web address]  Skype  [email or web address]   This document serves as a record of services personally performed by Tyler Pita, MD. It was created on his behalf by Wilburn Mylar, a trained medical scribe. The creation of this record is based on the scribe's personal observations and the provider's statements to them. This document has been checked and approved by the attending provider.

## 2021-04-19 ENCOUNTER — Telehealth: Payer: Self-pay | Admitting: *Deleted

## 2021-04-19 ENCOUNTER — Other Ambulatory Visit (HOSPITAL_COMMUNITY): Payer: Self-pay

## 2021-04-19 ENCOUNTER — Ambulatory Visit (HOSPITAL_COMMUNITY): Payer: 59

## 2021-04-19 ENCOUNTER — Other Ambulatory Visit: Payer: Self-pay | Admitting: Family

## 2021-04-19 DIAGNOSIS — K219 Gastro-esophageal reflux disease without esophagitis: Secondary | ICD-10-CM

## 2021-04-19 NOTE — Telephone Encounter (Signed)
Per Dr. Julien Nordmann, he would like to see patient earlier on 04/25/21. I called and spoke with patient and he is aware of appt time change.

## 2021-04-20 ENCOUNTER — Other Ambulatory Visit (HOSPITAL_COMMUNITY): Payer: Self-pay

## 2021-04-20 DIAGNOSIS — C3411 Malignant neoplasm of upper lobe, right bronchus or lung: Secondary | ICD-10-CM | POA: Diagnosis not present

## 2021-04-20 DIAGNOSIS — C7931 Secondary malignant neoplasm of brain: Secondary | ICD-10-CM | POA: Diagnosis not present

## 2021-04-20 DIAGNOSIS — Z510 Encounter for antineoplastic radiation therapy: Secondary | ICD-10-CM | POA: Diagnosis not present

## 2021-04-20 MED ORDER — PANTOPRAZOLE SODIUM 40 MG PO TBEC
40.0000 mg | DELAYED_RELEASE_TABLET | Freq: Every evening | ORAL | 0 refills | Status: DC
  Filled 2021-04-20: qty 30, 30d supply, fill #0

## 2021-04-24 ENCOUNTER — Other Ambulatory Visit: Payer: Self-pay

## 2021-04-24 ENCOUNTER — Ambulatory Visit
Admission: RE | Admit: 2021-04-24 | Discharge: 2021-04-24 | Disposition: A | Discharge status: Home / Self Care | Payer: 59 | Source: Ambulatory Visit | Attending: Radiation Oncology | Admitting: Radiation Oncology

## 2021-04-24 VITALS — BP 130/83 | HR 84 | Temp 96.8°F | Resp 18

## 2021-04-24 DIAGNOSIS — C3411 Malignant neoplasm of upper lobe, right bronchus or lung: Secondary | ICD-10-CM | POA: Diagnosis not present

## 2021-04-24 DIAGNOSIS — C7931 Secondary malignant neoplasm of brain: Secondary | ICD-10-CM | POA: Diagnosis not present

## 2021-04-24 DIAGNOSIS — Z510 Encounter for antineoplastic radiation therapy: Secondary | ICD-10-CM | POA: Diagnosis not present

## 2021-04-24 NOTE — Progress Notes (Signed)
  Radiation Oncology         [phone] ________________________________  Stereotactic Treatment Procedure Note  Name: JARAN SAINZ MRN: [id number]  Date: 04/24/2021  DOB: 17-Oct-1955  SPECIAL TREATMENT PROCEDURE    ICD-10-CM   1. Brain metastasis ([place])  C79.31       3D TREATMENT PLANNING AND DOSIMETRY:  The patient's radiation plan was reviewed and approved by neurosurgery and radiation oncology prior to treatment.  It showed 3-dimensional radiation distributions overlaid onto the planning CT/MRI image set.  The [facility] for the target structures as well as the organs at risk were reviewed. The documentation of the 3D plan and dosimetry are filed in the radiation oncology EMR.  NARRATIVE:  REY DANSBY was brought to the TrueBeam stereotactic radiation treatment machine and placed supine on the CT couch. The head frame was applied, and the patient was set up for stereotactic radiosurgery.  Neurosurgery was present for the set-up and delivery  SIMULATION VERIFICATION:  In the couch zero-angle position, the patient underwent Exactrac imaging using the Brainlab system with orthogonal KV images.  These were carefully aligned and repeated to confirm treatment position for each of the isocenters.  The Exactrac snap film verification was repeated at each couch angle.  PROCEDURE: Paras Kreider Karnik received stereotactic radiosurgery to the following targets:   All targets were treated using 8 Rapid Arc VMAT Beams to a prescription dose of 20 Gy except the pituitary lesion received 5 Gy.  ExacTrac registration was performed for each couch angle.  The 100% isodose line was prescribed.  6 MV X-rays were delivered in the flattening filter free beam mode.  He will return for 4 more fractions of 5 Gy to the pituitary met.  STEREOTACTIC TREATMENT MANAGEMENT:  Following delivery, the patient was transported to nursing in stable condition and monitored for possible acute effects.  Vital signs were recorded  There were no vitals taken for this visit.. The patient tolerated treatment without significant acute effects, and was discharged to home in stable condition.    PLAN: Follow-up in one month.  ________________________________  Sheral Apley. Tammi Klippel, M.D.

## 2021-04-24 NOTE — Op Note (Signed)
  Name: Bradley Goodman  MRN: [id number]  Date: 04/24/2021   DOB: [birth year]  Stereotactic Radiosurgery Operative Note  PRE-OPERATIVE DIAGNOSIS:  Multiple Brain Metastases  POST-OPERATIVE DIAGNOSIS:  Multiple Brain Metastases  PROCEDURE:  Stereotactic Radiosurgery  SURGEON:  Peggyann Shoals, MD  NARRATIVE: The patient underwent a radiation treatment planning session in the radiation oncology simulation suite under the care of the radiation oncology physician and physicist.  I participated closely in the radiation treatment planning afterwards. The patient underwent planning CT which was fused to 3T high resolution MRI with 1 mm axial slices.  These images were fused on the planning system.  We contoured the gross target volumes and subsequently expanded this to yield the Planning Target Volume. I actively participated in the planning process.  I helped to define and review the target contours and also the contours of the optic pathway, eyes, brainstem and selected nearby organs at risk.  All the dose constraints for critical structures were reviewed and compared to AAPM Task Group 101.  The prescription dose conformity was reviewed.  I approved the plan electronically.    Accordingly, [name] was brought to the TrueBeam stereotactic radiation treatment linac and placed in the custom immobilization mask.  The patient was aligned according to the IR fiducial markers with BrainLab Exactrac, then orthogonal x-rays were used in ExacTrac with the 6DOF robotic table and the shifts were made to align the patient  [name] received stereotactic radiosurgery uneventfully.    Lesions treated:    5 Complex lesions treated:  1 (>3.5 cm, <56mm of optic path, or within the brainstem)   The detailed description of the procedure is recorded in the radiation oncology procedure note.  I was present for the duration of the procedure.  DISPOSITION:  Following delivery, the patient was transported to  nursing in stable condition and monitored for possible acute effects to be discharged to home in stable condition with follow-up in one month.  Peggyann Shoals, MD 04/24/2021 3:13 PM

## 2021-04-24 NOTE — Progress Notes (Signed)
Nurse monitoring complete status post 1 of 1 SRS treatments. Patient without complaints. Patient denies new or worsening neurologic symptoms. Vitals stable. Instructed patient to avoid strenuous activity for the next 24 hours. Instructed patient to call [phone] with needs related to treatment after hours or over the weekend. Patient verbalized understanding Confirmed he knew of remaining treatment appointments to pituitary lesion. Ambulated out of clinic unassisted to wife's vehicle without incident  Vitals:   04/24/21 1530  BP: 130/83  Pulse: 84  Resp: 18  Temp: (!) 96.8 F (36 C)  SpO2: 100%

## 2021-04-25 ENCOUNTER — Inpatient Hospital Stay (HOSPITAL_BASED_OUTPATIENT_CLINIC_OR_DEPARTMENT_OTHER): Payer: 59 | Admitting: Internal Medicine

## 2021-04-25 ENCOUNTER — Other Ambulatory Visit (HOSPITAL_COMMUNITY): Payer: Self-pay

## 2021-04-25 ENCOUNTER — Telehealth: Payer: Self-pay | Admitting: Family Medicine

## 2021-04-25 ENCOUNTER — Inpatient Hospital Stay: Payer: 59 | Attending: Internal Medicine

## 2021-04-25 ENCOUNTER — Encounter (HOSPITAL_COMMUNITY): Payer: Self-pay | Admitting: Internal Medicine

## 2021-04-25 ENCOUNTER — Other Ambulatory Visit: Payer: 59

## 2021-04-25 ENCOUNTER — Other Ambulatory Visit: Payer: Self-pay

## 2021-04-25 ENCOUNTER — Ambulatory Visit: Payer: 59 | Admitting: Internal Medicine

## 2021-04-25 ENCOUNTER — Encounter (HOSPITAL_COMMUNITY): Payer: Self-pay

## 2021-04-25 VITALS — BP 122/78 | HR 102 | Temp 98.2°F | Resp 19 | Ht 72.0 in | Wt 162.5 lb

## 2021-04-25 DIAGNOSIS — F17210 Nicotine dependence, cigarettes, uncomplicated: Secondary | ICD-10-CM

## 2021-04-25 DIAGNOSIS — C3491 Malignant neoplasm of unspecified part of right bronchus or lung: Secondary | ICD-10-CM | POA: Insufficient documentation

## 2021-04-25 DIAGNOSIS — C7931 Secondary malignant neoplasm of brain: Secondary | ICD-10-CM

## 2021-04-25 DIAGNOSIS — C7989 Secondary malignant neoplasm of other specified sites: Secondary | ICD-10-CM

## 2021-04-25 DIAGNOSIS — Z5111 Encounter for antineoplastic chemotherapy: Secondary | ICD-10-CM

## 2021-04-25 DIAGNOSIS — Z5112 Encounter for antineoplastic immunotherapy: Secondary | ICD-10-CM

## 2021-04-25 DIAGNOSIS — C3411 Malignant neoplasm of upper lobe, right bronchus or lung: Secondary | ICD-10-CM

## 2021-04-25 DIAGNOSIS — C7951 Secondary malignant neoplasm of bone: Secondary | ICD-10-CM | POA: Diagnosis not present

## 2021-04-25 DIAGNOSIS — Z720 Tobacco use: Secondary | ICD-10-CM

## 2021-04-25 LAB — CBC WITH DIFFERENTIAL (CANCER CENTER ONLY)
Abs Immature Granulocytes: 0.01 10*3/uL (ref 0.00–0.07)
Basophils Absolute: 0.1 10*3/uL (ref 0.0–0.1)
Basophils Relative: 1 %
Eosinophils Absolute: 0.3 10*3/uL (ref 0.0–0.5)
Eosinophils Relative: 3 %
HCT: 41.6 % (ref 39.0–52.0)
Hemoglobin: 14 g/dL (ref 13.0–17.0)
Immature Granulocytes: 0 %
Lymphocytes Relative: 31 %
Lymphs Abs: 2.6 10*3/uL (ref 0.7–4.0)
MCH: 29.7 pg (ref 26.0–34.0)
MCHC: 33.7 g/dL (ref 30.0–36.0)
MCV: 88.1 fL (ref 80.0–100.0)
Monocytes Absolute: 0.6 10*3/uL (ref 0.1–1.0)
Monocytes Relative: 7 %
Neutro Abs: 4.8 10*3/uL (ref 1.7–7.7)
Neutrophils Relative %: 58 %
Platelet Count: 211 10*3/uL (ref 150–400)
RBC: 4.72 MIL/uL (ref 4.22–5.81)
RDW: 14.2 % (ref 11.5–15.5)
WBC Count: 8.3 10*3/uL (ref 4.0–10.5)
nRBC: 0 % (ref 0.0–0.2)

## 2021-04-25 LAB — CMP (CANCER CENTER ONLY)
ALT: 21 U/L (ref 0–44)
AST: 20 U/L (ref 15–41)
Albumin: 3.3 g/dL — ABNORMAL LOW (ref 3.5–5.0)
Alkaline Phosphatase: 70 U/L (ref 38–126)
Anion gap: 6 (ref 5–15)
BUN: 7 mg/dL — ABNORMAL LOW (ref 8–23)
CO2: 28 mmol/L (ref 22–32)
Calcium: 9.2 mg/dL (ref 8.9–10.3)
Chloride: 106 mmol/L (ref 98–111)
Creatinine: 1.03 mg/dL (ref 0.61–1.24)
GFR, Estimated: >60 mL/min (ref >60)
Glucose, Bld: 89 mg/dL (ref 70–99)
Potassium: 3.9 mmol/L (ref 3.5–5.1)
Sodium: 140 mmol/L (ref 135–145)
Total Bilirubin: 0.3 mg/dL (ref 0.3–1.2)
Total Protein: 6.3 g/dL — ABNORMAL LOW (ref 6.5–8.1)

## 2021-04-25 MED ORDER — CYANOCOBALAMIN 1000 MCG/ML IJ SOLN
1000.0000 ug | Freq: Once | INTRAMUSCULAR | Status: AC
  Administered 2021-04-25: 1000 ug via INTRAMUSCULAR

## 2021-04-25 MED ORDER — PROCHLORPERAZINE MALEATE 10 MG PO TABS
10.0000 mg | ORAL_TABLET | Freq: Four times a day (QID) | ORAL | 0 refills | Status: DC | PRN
  Filled 2021-04-25: qty 30, 8d supply, fill #0

## 2021-04-25 MED ORDER — FOLIC ACID 1 MG PO TABS
1.0000 mg | ORAL_TABLET | Freq: Every day | ORAL | 4 refills | Status: DC
  Filled 2021-04-25: qty 30, 30d supply, fill #0
  Filled 2021-06-02: qty 30, 30d supply, fill #1
  Filled 2021-06-30: qty 30, 30d supply, fill #2
  Filled 2021-08-08: qty 30, 30d supply, fill #3
  Filled 2021-09-05: qty 30, 30d supply, fill #4

## 2021-04-25 MED ORDER — CYANOCOBALAMIN 1000 MCG/ML IJ SOLN
INTRAMUSCULAR | Status: AC
  Filled 2021-04-25: qty 1

## 2021-04-25 NOTE — Progress Notes (Signed)
START OFF PATHWAY REGIMEN - Non-Small Cell Lung   [id number]:Pembrolizumab 200 mg  IV D1 + Pemetrexed 500 mg/m2 IV D1 + Carboplatin AUC=5 IV D1 q21 Days:   A cycle is every 21 days:     Pembrolizumab      Pemetrexed      Carboplatin   **Always confirm dose/schedule in your pharmacy ordering system**  **Administration Notes: We will proceed with this treatment as planned if the molecular studies next week are negative for molecular actionable mutations.  Patient Characteristics: Stage IV Metastatic, Nonsquamous, Awaiting Molecular Test Results and Need to Start Chemotherapy, PS = 0, 1 Therapeutic Status: Stage IV Metastatic Histology: Nonsquamous Cell Broad Molecular Profiling Status: Awaiting Molecular Test Results and Need to Start Chemotherapy ECOG Performance Status: 1 Intent of Therapy: Non-Curative / Palliative Intent, Discussed with Patient

## 2021-04-25 NOTE — Progress Notes (Signed)
[place] [phone]   [phone]  CONSULT NOTE  REFERRING PHYSICIAN: Dr. Tyler Pita  REASON FOR CONSULTATION:  65 years old white male recently diagnosed with lung cancer.  HPI Bradley Goodman is a 65 y.o. male with past medical history significant for hypertension, GERD, dyslipidemia, diabetes mellitus, cervical spondylosis as well as long history of smoking.  The patient mentioned that on March 27, 2021 he was returning from the beach when he started having left eyedrop with floaters in front of his eye and was concerned about the possibility of stroke.  He presented to the emergency department at [facility] and CT scan of the head followed by MRI of the brain on 03/27/2021 showed no acute infarct but there was masslike thickening of the infundibulum and pituitary protocol MRI with contrast was recommended for further evaluation and to exclude the lesion at this site.  On 03/28/2021 the patient underwent repeat MRI of the brain with and without contrast and it showed small 5 mm peripherally enhancing lesion in the right parietal lobe concerning for metastasis.  There was also punctate focus of enhancement in the left cerebellum that may represent a small metastasis versus a vessel.  In the pituitary there was abnormal masslike enlargement/thickening and mild enhancement of the posterior pituitary and infundibulum with small areas of abnormal enhancement in the hypothalamus.  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  The patient also had CT scan of the chest, abdomen pelvis on 03/29/2021 and it showed a 1.2 cm right upper lobe pulmonary nodule most consistent with malignancy.  There was multiple additional small nodular density in the right upper lobe likely satellite lesions.  There was subcarinal adenopathy new since the prior CT most consistent with metastatic disease and there was a 0.8 cm groundglass nodule in the lingula previously  measuring 0.5 cm.  There was also a 1.1 cm nonobstructing left renal upper pole stone.  The patient was referred to Dr. Valeta Harms and on April 05, 2021 he underwent video bronchoscopy with endobronchial ultrasound procedure.  The final pathology from the fine-needle aspiration of level 7 lymph node ([id number]) showed malignant cells consistent with non-small cell carcinoma favoring adenocarcinoma based on the discussion at the weekly thoracic conference with pathology. The patient had a PET scan on April 07, 2021 and it showed hypermetabolic right upper lobe lung nodule concerning for primary bronchogenic carcinoma.  There was several too small to characterize satellite nodules noted within the right upper lobe.  There was also FDG avid bronchial wall thickening within the central right upper lobe airways concerning for endobronchial spread of tumor.  There was also FDG avid right hilar, subcarinal, bilateral mediastinal and bilateral supraclavicular lymph nodes compatible with nodal metastasis.  The scan also showed 2 suspicious foci of increased uptake within the right sacrum concerning for osseous metastasis. The patient is currently undergoing SRS to the metastatic brain lesions under the care of Dr. Tammi Klippel.  He received 1 out of 4 treatment and expected to complete this course on May 04, 2021. He was referred to me today for evaluation and recommendation regarding treatment of his condition. When seen today the patient continues to complain of fatigue and lack of sleep as well as lack of appetite but no significant weight loss.  He has mild cough with no chest pain, shortness of breath or hemoptysis.  He has no nausea, vomiting, diarrhea or constipation.  He has occasional headache. Family history significant for mother,  father and brother with heart disease. The patient is married and has 2 children a son and daughter.  He lives in [name], [name].  He was accompanied today by his wife and 8.   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  He has a history of smoking more than 1 pack/day for over 30 years and unfortunately continues to smoke.  He drinks alcohol occasionally and no history of drug abuse.   HPI  Past Medical History:  Diagnosis Date   Cervical spondylolysis    Essential hypertension    GERD (gastroesophageal reflux disease)    History of kidney stones    History of migraine    Hyperlipidemia    PONV (postoperative nausea and vomiting)    Type 2 diabetes mellitus ([place])     Past Surgical History:  Procedure Laterality Date   Bilateral inguinal hernia repair     BRONCHIAL NEEDLE ASPIRATION BIOPSY  04/05/2021   Procedure: BRONCHIAL NEEDLE ASPIRATION BIOPSIES;  Surgeon: Garner Nash, DO;  Location: [place];  Service: Pulmonary;;   COLONOSCOPY  01/23/2012   Procedure: COLONOSCOPY;  Surgeon: Daneil Dolin, MD;  [facility];  Service: Endoscopy;  Laterality: N/A;  9:30 AM   COLONOSCOPY N/A 10/11/2015   Procedure: COLONOSCOPY;  Surgeon: Daneil Dolin, MD;  [facility];  Service: Endoscopy;  Laterality: N/A;  830   COLONOSCOPY N/A 10/14/2019   Procedure: COLONOSCOPY;  Surgeon: Daneil Dolin, MD;  [facility];  Service: Endoscopy;  Laterality: N/A;  1:45   POLYPECTOMY  10/14/2019   Procedure: POLYPECTOMY;  Surgeon: Daneil Dolin, MD;  [facility];  Service: Endoscopy;;  ascending colon, descending colon   VIDEO BRONCHOSCOPY WITH ENDOBRONCHIAL ULTRASOUND N/A 04/05/2021   Procedure: VIDEO BRONCHOSCOPY WITH ENDOBRONCHIAL ULTRASOUND;  Surgeon: Garner Nash, DO;  Location: [place];  Service: Pulmonary;  Laterality: N/A;    Family History  Problem Relation Age of Onset   Heart attack Mother    Heart attack Father    Heart attack Brother    Colon cancer Neg Hx     Social History Social History   Tobacco Use   Smoking status: Every Day    Packs/day: 0.50    Years:  30.00    Pack years: 15.00    Types: Cigarettes    Start date: 10/30/1973   Smokeless tobacco: Never  Vaping Use   Vaping Use: Never used  Substance Use Topics   Alcohol use: Yes    Alcohol/week: 0.0 standard drinks    Comment: One drink every 6 months.   Drug use: No    No Known Allergies  Current Outpatient Medications  Medication Sig Dispense Refill   aspirin EC 81 MG tablet Take 81 mg by mouth every evening.     cholecalciferol (VITAMIN D) 1000 UNITS tablet Take 1,000 Units by mouth every evening.     ibuprofen (ADVIL) 200 MG tablet Take 400 mg by mouth every 8 (eight) hours as needed (pain).     Krill Oil 300 MG CAPS Take 300 mg by mouth every evening.     loratadine (CLARITIN) 10 MG tablet Take 10 mg by mouth every evening.      losartan (COZAAR) 50 MG tablet TAKE 1 AND 1/2 TABLETS BY MOUTH DAILY 135 tablet 1   pantoprazole (PROTONIX) 40 MG tablet Take 1 tablet (40 mg total) by mouth every evening. (NEEDS TO BE SEEN BEFORE NEXT  REFILL) 30 tablet 0   rosuvastatin (CRESTOR) 20 MG tablet Take 1 tablet (20 mg total) by mouth daily. 90 tablet 1   venlafaxine XR (EFFEXOR-XR) 75 MG 24 hr capsule Take 1 capsule (75 mg total) by mouth daily with breakfast. 90 capsule 1   No current facility-administered medications for this visit.    Review of Systems  Constitutional: positive for anorexia, fatigue, and weight loss Eyes: negative Ears, nose, mouth, throat, and face: negative Respiratory: positive for cough Cardiovascular: negative Gastrointestinal: negative Genitourinary:negative Integument/breast: negative Hematologic/lymphatic: negative Musculoskeletal:negative Neurological: negative Behavioral/Psych: positive for sleep disturbance Endocrine: negative Allergic/Immunologic: negative  Physical Exam  WUG:QBVQX, healthy, no distress, well nourished, and well developed SKIN: skin color, texture, turgor are normal, no rashes or significant lesions HEAD: Normocephalic, No  masses, lesions, tenderness or abnormalities EYES: normal, PERRLA, Conjunctiva are pink and non-injected EARS: External ears normal, Canals clear OROPHARYNX:no exudate, no erythema, and lips, buccal mucosa, and tongue normal  NECK: supple, no adenopathy, no JVD LYMPH:  no palpable lymphadenopathy, no hepatosplenomegaly BREAST:not examined LUNGS: clear to auscultation , and palpation HEART: regular rate & rhythm, no murmurs, and no gallops ABDOMEN:abdomen soft, non-tender, normal bowel sounds, and no masses or organomegaly BACK: No CVA tenderness, Range of motion is normal EXTREMITIES:no joint deformities, effusion, or inflammation, no edema  NEURO: alert & oriented x 3 with fluent speech, no focal motor/sensory deficits  PERFORMANCE STATUS: ECOG 1  LABORATORY DATA: Lab Results  Component Value Date   WBC 8.3 04/25/2021   HGB 14.0 04/25/2021   HCT 41.6 04/25/2021   MCV 88.1 04/25/2021   PLT 211 04/25/2021      Chemistry      Component Value Date/Time   NA 145 03/29/2021 1520   NA 144 01/10/2021 0849   K 4.0 03/29/2021 1520   CL 108 03/29/2021 1520   CO2 27 03/27/2021 1154   BUN 8 03/29/2021 1520   BUN 15 01/10/2021 0849   CREATININE 1.00 03/29/2021 1520      Component Value Date/Time   CALCIUM 9.6 03/27/2021 1154   ALKPHOS 80 03/27/2021 1154   AST 37 03/27/2021 1154   ALT 35 03/27/2021 1154   BILITOT 0.8 03/27/2021 1154   BILITOT 0.2 01/10/2021 0849       RADIOGRAPHIC STUDIES: MR ANGIO HEAD WO CONTRAST  Result Date: 03/27/2021 CLINICAL DATA:  Neuro deficit, acute stroke suspected possible vision changes. EXAM: MRI HEAD WITHOUT CONTRAST MRA HEAD WITHOUT CONTRAST MRA NECK WITHOUT AND WITH CONTRAST TECHNIQUE: Multiplanar, multi-echo pulse sequences of the brain and surrounding structures were acquired without intravenous contrast. Angiographic images of the Circle of Willis were acquired using MRA technique without intravenous contrast. Angiographic images of the neck  were acquired using MRA technique without and with intravenous contrast. Carotid stenosis measurements (when applicable) are obtained utilizing NASCET criteria, using the distal internal carotid diameter as the denominator. CONTRAST:  40m GADAVIST GADOBUTROL 1 MMOL/ML IV SOLN COMPARISON:  Same day CT head. FINDINGS: MRI HEAD FINDINGS Intermittently motion limited exam. Brain: No acute infarction, hemorrhage, or hydrocephalus. Mild scattered T2/FLAIR hyperintensities within the white matter, nonspecific but most likely related to chronic microvascular ischemic disease. Vascular: See below. Skull and upper cervical spine: Normal marrow signal. Sinuses/Orbits: Clear sinuses.  Unremarkable orbits. Other: No mastoid effusions. Nonspecific cyst in the left paramidline maxilla. MRA HEAD FINDINGS Mildly motion limited evaluation. Anterior circulation: No large vessel occlusion or proximal hemodynamically significant stenosis. No visible aneurysm. Early left MCA bifurcation. Posterior circulation: No large vessel occlusion  or proximal hemodynamically significant stenosis. No visible aneurysm. MRA NECK FINDINGS Limited by venous contamination. Aortic arch: Great vessel origins are patent. Right carotid system: Patent without evidence of significant (greater than 50%) stenosis. Left carotid system: Patent without evidence of significant (greater than 50%) stenosis. Limited visualization of the origin. Vertebral arteries: Right dominant. The small/non dominant left vertebral artery poorly characterized proximally. Right vertebral artery is patent without evidence of hemodynamically significant stenosis. IMPRESSION: MRI: 1. No acute infarct. 2. Redemonstrated masslike thickening of the infundibulum. Recommend pituitary protocol MRI with contrast to further evaluate and exclude a lesion at this site, particularly given the patient's reported vision changes and proximity to the optic chiasm. 3. Mild chronic microvascular disease.  MRA Head: No evidence of large vessel occlusion proximal hemodynamically significant stenosis. MRA neck: 1. Limited by venous timing with poor visualization of the non dominant left vertebral artery proximally. Stenosis is not excluded in this regions and CTA neck could further characterize if clinically indicated. 2. Otherwise, no visible hemodynamically significant stenosis. Electronically Signed   By: Margaretha Sheffield MD   On: 03/27/2021 15:09   MR Angiogram Neck W or Wo Contrast  Result Date: 03/27/2021 CLINICAL DATA:  Neuro deficit, acute stroke suspected possible vision changes. EXAM: MRI HEAD WITHOUT CONTRAST MRA HEAD WITHOUT CONTRAST MRA NECK WITHOUT AND WITH CONTRAST TECHNIQUE: Multiplanar, multi-echo pulse sequences of the brain and surrounding structures were acquired without intravenous contrast. Angiographic images of the Circle of Willis were acquired using MRA technique without intravenous contrast. Angiographic images of the neck were acquired using MRA technique without and with intravenous contrast. Carotid stenosis measurements (when applicable) are obtained utilizing NASCET criteria, using the distal internal carotid diameter as the denominator. CONTRAST:  14m GADAVIST GADOBUTROL 1 MMOL/ML IV SOLN COMPARISON:  Same day CT head. FINDINGS: MRI HEAD FINDINGS Intermittently motion limited exam. Brain: No acute infarction, hemorrhage, or hydrocephalus. Mild scattered T2/FLAIR hyperintensities within the white matter, nonspecific but most likely related to chronic microvascular ischemic disease. Vascular: See below. Skull and upper cervical spine: Normal marrow signal. Sinuses/Orbits: Clear sinuses.  Unremarkable orbits. Other: No mastoid effusions. Nonspecific cyst in the left paramidline maxilla. MRA HEAD FINDINGS Mildly motion limited evaluation. Anterior circulation: No large vessel occlusion or proximal hemodynamically significant stenosis. No visible aneurysm. Early left MCA bifurcation.  Posterior circulation: No large vessel occlusion or proximal hemodynamically significant stenosis. No visible aneurysm. MRA NECK FINDINGS Limited by venous contamination. Aortic arch: Great vessel origins are patent. Right carotid system: Patent without evidence of significant (greater than 50%) stenosis. Left carotid system: Patent without evidence of significant (greater than 50%) stenosis. Limited visualization of the origin. Vertebral arteries: Right dominant. The small/non dominant left vertebral artery poorly characterized proximally. Right vertebral artery is patent without evidence of hemodynamically significant stenosis. IMPRESSION: MRI: 1. No acute infarct. 2. Redemonstrated masslike thickening of the infundibulum. Recommend pituitary protocol MRI with contrast to further evaluate and exclude a lesion at this site, particularly given the patient's reported vision changes and proximity to the optic chiasm. 3. Mild chronic microvascular disease. MRA Head: No evidence of large vessel occlusion proximal hemodynamically significant stenosis. MRA neck: 1. Limited by venous timing with poor visualization of the non dominant left vertebral artery proximally. Stenosis is not excluded in this regions and CTA neck could further characterize if clinically indicated. 2. Otherwise, no visible hemodynamically significant stenosis. Electronically Signed   By: FMargaretha SheffieldMD   On: 03/27/2021 15:09   MR BRAIN WO CONTRAST  Result Date:  03/27/2021 CLINICAL DATA:  Neuro deficit, acute stroke suspected possible vision changes. EXAM: MRI HEAD WITHOUT CONTRAST MRA HEAD WITHOUT CONTRAST MRA NECK WITHOUT AND WITH CONTRAST TECHNIQUE: Multiplanar, multi-echo pulse sequences of the brain and surrounding structures were acquired without intravenous contrast. Angiographic images of the Circle of Willis were acquired using MRA technique without intravenous contrast. Angiographic images of the neck were acquired using MRA  technique without and with intravenous contrast. Carotid stenosis measurements (when applicable) are obtained utilizing NASCET criteria, using the distal internal carotid diameter as the denominator. CONTRAST:  40m GADAVIST GADOBUTROL 1 MMOL/ML IV SOLN COMPARISON:  Same day CT head. FINDINGS: MRI HEAD FINDINGS Intermittently motion limited exam. Brain: No acute infarction, hemorrhage, or hydrocephalus. Mild scattered T2/FLAIR hyperintensities within the white matter, nonspecific but most likely related to chronic microvascular ischemic disease. Vascular: See below. Skull and upper cervical spine: Normal marrow signal. Sinuses/Orbits: Clear sinuses.  Unremarkable orbits. Other: No mastoid effusions. Nonspecific cyst in the left paramidline maxilla. MRA HEAD FINDINGS Mildly motion limited evaluation. Anterior circulation: No large vessel occlusion or proximal hemodynamically significant stenosis. No visible aneurysm. Early left MCA bifurcation. Posterior circulation: No large vessel occlusion or proximal hemodynamically significant stenosis. No visible aneurysm. MRA NECK FINDINGS Limited by venous contamination. Aortic arch: Great vessel origins are patent. Right carotid system: Patent without evidence of significant (greater than 50%) stenosis. Left carotid system: Patent without evidence of significant (greater than 50%) stenosis. Limited visualization of the origin. Vertebral arteries: Right dominant. The small/non dominant left vertebral artery poorly characterized proximally. Right vertebral artery is patent without evidence of hemodynamically significant stenosis. IMPRESSION: MRI: 1. No acute infarct. 2. Redemonstrated masslike thickening of the infundibulum. Recommend pituitary protocol MRI with contrast to further evaluate and exclude a lesion at this site, particularly given the patient's reported vision changes and proximity to the optic chiasm. 3. Mild chronic microvascular disease. MRA Head: No evidence of  large vessel occlusion proximal hemodynamically significant stenosis. MRA neck: 1. Limited by venous timing with poor visualization of the non dominant left vertebral artery proximally. Stenosis is not excluded in this regions and CTA neck could further characterize if clinically indicated. 2. Otherwise, no visible hemodynamically significant stenosis. Electronically Signed   By: FMargaretha SheffieldMD   On: 03/27/2021 15:09   MR Brain W Wo Contrast  Result Date: 04/18/2021 CLINICAL DATA:  Brain/CNS neoplasm. EXAM: MRI HEAD WITHOUT AND WITH CONTRAST TECHNIQUE: Multiplanar, multiecho pulse sequences of the brain and surrounding structures were obtained without and with intravenous contrast. CONTRAST:  177mMULTIHANCE GADOBENATE DIMEGLUMINE 529 MG/ML IV SOLN COMPARISON:  June 20/21 MRI (postcontrast imaging performed on June 21). FINDINGS: Brain: Slight increase in size of a 7 mm peripherally enhancing metastatic lesion in the right parietal lobe (series 12, image 93), previously 5 mm. Interval increase in size of a 5 mm enhancing metastasis in the left cerebellum (series 12, image 49), previously punctate. Increased associated edema without significant mass effect. New punctate focus of enhancement in the high anterior right frontal lobe (series 12, image 127) and the left parietal lobe (series 12, image 112; series 14, image 31) No substantial change in abnormal enhancement involving the pituitary infundibulum, hypothalamus, and posterior pituitary, which was further evaluated on prior postcontrast pituitary protocol imaging from June 21 22. No acute infarct, acute hemorrhage, hydrocephalus, midline shift, basal cistern effacement, or extra-axial fluid collection. Moderate scattered T2/FLAIR hyperintensities within the white matter, nonspecific but most likely related to chronic microvascular ischemic disease. Vascular: Major arterial flow voids  are maintained at the skull base. Skull and upper cervical spine:  Scattered small foci of enhancement in the calvarium (for example, series 12, images 93, 114 and 134 and the left posterior calvarium) with very faint FLAIR hyperintensity in these regions. Sinuses/Orbits: Mild ethmoid air cell mucosal thickening. Unremarkable orbits. Other: No mastoid effusions. IMPRESSION: 1. Increased 7 mm metastasis in the right parietal lobe metastasis (previously 5 mm) and 5 mm metastasis in the left cerebellum (previously punctate). Increased associated edema without significant mass effect. 2. New punctate focus of enhancement in the left parietal lobe (series 12, image 112), which is suspicious for new punctate metastasis. 3. New punctate focus of enhancement in the high anterior right frontal lobe (series 12, image 127), which could represent a punctate metastasis versus cortical vessel. 4. No substantial change in abnormal enhancement involving the pituitary infundibulum, hypothalamus, and posterior pituitary, which was further evaluated on prior postcontrast pituitary protocol imaging from June 21, 22. Please see that study for differential considerations. 5. Multiple small foci of enhancement in the calvarium, which are not specific but could represent early osseous metastases. Recommend attention on follow-up. Electronically Signed   By: Margaretha Sheffield MD   On: 04/18/2021 08:02   MR BRAIN W WO CONTRAST  Addendum Date: 03/29/2021   ADDENDUM REPORT: 03/29/2021 12:23 ADDENDUM: Findings also discussed with Dr. Quinn Axe via telephone at 12:14 p.m. Electronically Signed   By: Margaretha Sheffield MD   On: 03/29/2021 12:23   Addendum Date: 03/28/2021   ADDENDUM REPORT: 03/28/2021 17:31 ADDENDUM: Findings discussed with Dr. Reather Converse via telephone at 5:04 p.m. Electronically Signed   By: Margaretha Sheffield MD   On: 03/28/2021 17:31   Result Date: 03/29/2021 CLINICAL DATA:  Pituitary mass follow-up. EXAM: MRI HEAD WITHOUT AND WITH CONTRAST TECHNIQUE: Multiplanar, multiecho pulse sequences of  the brain and surrounding structures were obtained without and with intravenous contrast. CONTRAST:  41m GADAVIST GADOBUTROL 1 MMOL/ML IV SOLN COMPARISON:  MRI March 27, 2021. FINDINGS: Dedicated pituitary protocol was performed to further evaluate the potential pituitary mass seen on prior MRI and CT. In the pituitary, there is abnormal masslike enlargement and mild enhancement of the posterior pituitary and infundibulum which mildly diplaces the normally enhancing pituitary anteriorly inferiorly. The infundibulum and posterior pituitary is heterogeneously enhancing. The abnormally thickened infundibulum contacts and mildly distorts the optic chiasm (series 8, image 8). There are also small areas of patchy abnormal enhancement along the inferior aspect of the hypothalamus (for example series 12, images 5/6). On the whole brain postcontrast imaging there is a small (5 mm) peripherally enhancing lesion in the right parietal lobe (series 14, image 91) that is concerning for a metastasis. Punctate focus of enhancement in the left cerebellum (series 14, image 47) may represent a small metastasis versus a vessel. IMPRESSION: 1. On the whole brain postcontrast imaging there is a small (5 mm) peripherally enhancing lesion in the right parietal lobe that is concerning for a metastasis. Punctate focus of enhancement in the left cerebellum may represent a small metastasis versus a vessel. Recommend evaluation for a primary malignancy (including consideration for a CT chest, abdomen and pelvis). 2. In the pituitary, there is abnormal masslike enlargement/thickening and mild enhancement of the posterior pituitary and infundibulum with small areas of abnormal enhancement in the hypothalamus. The abnormally thickened infundibulum contacts and mildly distorts the optic chiasm. A pituitary metastasis is a consideration given the possible intraparenchymal brain metastasis described above. Differential also includes inflammatory  etiologies (such as IgG4, sarcoidosis,  or hypophysitis) and other neoplasms (such as glioma or germinoma). Electronically Signed: By: Margaretha Sheffield MD On: 03/28/2021 16:56   CT CHEST ABDOMEN PELVIS W CONTRAST  Result Date: 03/29/2021 CLINICAL DATA:  65 year old male with neurologic deficit. Concern for malignancy. EXAM: CT CHEST, ABDOMEN, AND PELVIS WITH CONTRAST TECHNIQUE: Multidetector CT imaging of the chest, abdomen and pelvis was performed following the standard protocol during bolus administration of intravenous contrast. CONTRAST:  197m OMNIPAQUE IOHEXOL 300 MG/ML  SOLN COMPARISON:  Chest CT dated 01/17/2017. FINDINGS: CT CHEST FINDINGS Cardiovascular: There is no cardiomegaly. Trace pericardial effusion anterior to the heart. Mild atherosclerotic calcification of the thoracic aorta. No aneurysmal dilatation or dissection. The origins of the great vessels of the aortic arch appear patent as visualized. The central pulmonary arteries are unremarkable. Mediastinum/Nodes: No hilar adenopathy. Subcarinal adenopathy measures 2 cm in short axis, new since the prior CT. The esophagus is grossly unremarkable. There is an 8 mm left thyroid hypodense nodule similar to prior CT. Not clinically significant; no follow-up imaging recommended (ref: J Am Coll Radiol. 2015 Feb;12(2): 143-50).No mediastinal fluid collection. Lungs/Pleura: There is a 12 mm nodule with lobulated margin in the right upper lobe most consistent with malignancy. Several smaller scattered nodules noted in the right upper lobe likely representing tiny satellite lesions. There is an 8 mm ground-glass nodule in the lingula (86/5) previously measuring approximately 5 mm. No lobar consolidation, pleural effusion, or pneumothorax. The central airways are patent. Musculoskeletal: No acute osseous pathology. A small sclerotic focus in the posterior left ninth rib, likely a bone island. CT ABDOMEN PELVIS FINDINGS No intra-abdominal free air or free  fluid. Hepatobiliary: 2 subcentimeter hypodense lesions in the posterior right lobe of the liver are too small to characterize. The liver is otherwise unremarkable. No intrahepatic biliary dilatation. The gallbladder is unremarkable. Pancreas: Unremarkable. No pancreatic ductal dilatation or surrounding inflammatory changes. Spleen: Normal in size without focal abnormality. Adrenals/Urinary Tract: The adrenal glands are unremarkable. There is an 11 mm nonobstructing stone in the upper pole of the left kidney. No hydronephrosis. The right kidney is unremarkable. There is symmetric enhancement and excretion of contrast by both kidneys. The visualized ureters and urinary bladder appear unremarkable. Stomach/Bowel: Several small scattered sigmoid diverticula without active inflammatory changes. There is no bowel obstruction or active inflammation. The appendix is normal. Vascular/Lymphatic: Moderate aortoiliac atherosclerotic disease. The IVC is unremarkable. No portal venous gas. There is no adenopathy. Reproductive: Mildly enlarged prostate gland measuring 5.3 cm in transverse axial diameter. The seminal vesicles are symmetric. Partially visualized small right hydrocele. Inguinal hernia repair plug. Other: None Musculoskeletal: Fragmentation of the anterior right symphysis previous. No acute osseous pathology. No suspicious bone lesions. Degenerative changes of the lower lumbar spine. IMPRESSION: 1. A 12 mm right upper lobe pulmonary nodule most consistent with malignancy. Multiple additional small nodular density in the right upper lobe, likely satellite lesions. Multidisciplinary consult including further evaluation with PET-CT recommended. 2. Subcarinal adenopathy, new since the prior CT, most consistent with metastatic disease. 3. A 8 mm ground-glass nodule in the lingula previously measuring approximately 5 mm. 4. No evidence of metastatic disease in the abdomen or pelvis. 5. A 11 mm nonobstructing left renal  upper pole stone. No hydronephrosis. 6. Sigmoid diverticulosis. No bowel obstruction. Normal appendix. 7. Aortic Atherosclerosis (ICD10-I70.0). Electronically Signed   By: AAnner CreteM.D.   On: 03/29/2021 17:04   NM PET Image Initial (PI) Skull Base To Thigh  Result Date: 04/08/2021 CLINICAL DATA:  Initial treatment  strategy for Lung cancer. EXAM: NUCLEAR MEDICINE PET SKULL BASE TO THIGH TECHNIQUE: 8.28 mCi F-18 FDG was injected intravenously. Full-ring PET imaging was performed from the skull base to thigh after the radiotracer. CT data was obtained and used for attenuation correction and anatomic localization. Fasting blood glucose: 120 mg/dl COMPARISON:  CT chest, abdomen and pelvis 03/29/2021 FINDINGS: Mediastinal blood pool activity: SUV max 2.82 Liver activity: SUV max NA NECK: No hypermetabolic lymph nodes in the neck. Incidental CT findings: none CHEST: Within the periphery of the right upper lobe there is a 1.1 cm nodule with an SUV max of 7.2, image 31/8. Additional, smaller nodules are identified within the right upper lobe which are too small to characterize, likely representing small satellite nodules. There is wall thickening with increased FDG uptake involving the central right upper lobe airways with SUV max of 6.14. Suspicious for endobronchial spread of tumor. FDG avid right hilar, subcarinal, bilateral mediastinal and bilateral supraclavicular lymph nodes concerning for metastatic adenopathy: SUV max within the right hilum is equal to 5.99. Subcarinal lymph node measures 2 cm short axis with SUV max 12.57, image 82/4. Low left paratracheal lymph node measures 0.7 cm within SUV max of 8.4, image 68/4. Left supraclavicular lymph node measures 0.6 cm within SUV max of 3.78, image 52/. Right supraclavicular lymph node measures 0.8 cm within SUV max of 6.74, image 48/4. Incidental CT findings: Aortic atherosclerosis and coronary artery calcifications. ABDOMEN/PELVIS: No abnormal FDG uptake  identified within the liver, spleen, or adrenal glands. No FDG avid abdominopelvic lymph. Small low attenuation structure within the inferior right hepatic lobe is too small to characterize measuring 7 mm. No FDG uptake identified within this structure. Incidental CT findings: Nonobstructing left renal calculi measures 8 mm, image 130/. Aortic atherosclerosis. SKELETON: There are 2 suspicious foci of increased uptake within the right sacrum. SUV equal to 3.97 and 4.0. No convincing corresponding abnormality noted on the CT images. Incidental CT findings: none IMPRESSION: 1. Right upper lobe lung nodule is FDG avid and concerning for primary bronchogenic carcinoma. Several too small to characterize satellite nodules are also noted within the right upper lobe. FDG avid bronchial wall thickening within the central right upper lobe airways concerning for endobronchial spread of tumor. 2. FDG avid right hilar, subcarinal, bilateral mediastinal, and bilateral supraclavicular lymph nodes compatible with nodal metastasis. If tissue sampling is clinically desired the right supraclavicular lymph node may be amendable to percutaneous biopsy under ultrasound guidance. 3. There are 2 suspicious foci of increased uptake within the right sacrum concerning for osseous metastasis. No corresponding CT abnormality identified this time Electronically Signed   By: Kerby Moors M.D.   On: 04/08/2021 16:17   CT HEAD CODE STROKE WO CONTRAST  Result Date: 03/27/2021 CLINICAL DATA:  Code stroke. Neuro deficit, acute, stroke suspected. Left eye droop and feeling "off." "Eye floaters." EXAM: CT HEAD WITHOUT CONTRAST TECHNIQUE: Contiguous axial images were obtained from the base of the skull through the vertex without intravenous contrast. COMPARISON:  No pertinent prior exams available for comparison. FINDINGS: Brain: Cerebral volume is normal for age. 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 Mild  patchy and ill-defined hypoattenuation within the cerebral white matter, nonspecific but compatible with chronic small vessel ischemic disease. There is no acute intracranial hemorrhage. No demarcated cortical infarct. No extra-axial fluid collection. No evidence of intracranial mass. No midline shift. Nonspecific thickening of the pituitary stalk, measuring 6 mm in width. Vascular: No hyperdense vessel.  Atherosclerotic  calcifications. Skull: Normal. Negative for fracture or focal lesion. Sinuses/Orbits: Visualized orbits show no acute finding. Trace mucosal thickening within the partially imaged right maxillary sinus. ASPECTS ([place] Stroke Program Early CT Score) - Ganglionic level infarction (caudate, lentiform nuclei, internal capsule, insula, M1-M3 cortex): 7 - Supraganglionic infarction (M4-M6 cortex): 3 Total score (0-10 with 10 being normal): 10 These results were communicated to Dr. Quinn Axe At 12:22 pmon 6/20/2022by text page via the [facility] messaging system. IMPRESSION: No evidence of acute cortical infarct or acute intracranial hemorrhage. 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 Nonspecific thickening of the pituitary stalk, measuring 6 mm in width. A contrast enhanced brain MRI with pituitary protocol is recommended for further characterization, and to exclude a lesion at this site. Mild cerebral white matter chronic small vessel ischemic disease. Electronically Signed   By: Kellie Simmering DO   On: 03/27/2021 12:22    ASSESSMENT: This is a very pleasant 65 years old white male recently diagnosed with stage IV (T1b, N3, M1C) non-small cell lung cancer, favoring adenocarcinoma presented with right upper lobe lung nodule in addition to right hilar, subcarinal and bilateral mediastinal as well as supraclavicular lymphadenopathy in addition to bone and brain metastasis diagnosed in June 2022. PD-L1 expression 80%.  Other molecular studies are still pending.  PLAN: I had a lengthy  discussion with the patient and his wife today about his current disease stage, prognosis and treatment options. I personally and independently reviewed the scan images and discussed the results with the patient and his wife. I recommended for the patient to proceed with the [facility] treatment for the brain metastasis as recommended by Dr. Tammi Klippel and this is expected to be completed on May 04, 2021. I explained to the patient that he has incurable condition and all the treatment will be of palliative nature I gave the patient the option of palliative care versus palliative treatment with systemic chemotherapy if he has no actionable mutation. His molecular studies are still pending. If the patient has no actionable mutation I would consider him for treatment with carboplatin for AUC of 5, Alimta 500 Mg/M2 and Keytruda 200 Mg every 3 weeks.  He is expected to start the first cycle of this treatment on May 08, 2021.  The patient would like to proceed with the treatment as planned. I discussed with him the adverse effect of this treatment including but not limited to alopecia, myelosuppression, nausea and vomiting, peripheral neuropathy, liver or renal dysfunction as well as immunotherapy adverse effects. He will have a chemotherapy education class before the first dose of his treatment. The patient will receive vitamin B12 injection today and I will call his pharmacy with prescription for Compazine 10 mg p.o. every 6 hours as needed for nausea in addition to folic acid 1 mg p.o. daily. If he has no actionable mutations and his treatment will be with chemotherapy, we will refer the patient to IR for Port-A-Cath placement.   The patient will come back for follow-up visit with the start of the first cycle of his treatment. I strongly advised the patient to quit smoking. He was also advised to call immediately if he has any other concerning symptoms in the interval. The patient voices understanding of current  disease status and treatment options and is in agreement with the current care plan.  All questions were answered. The patient knows to call the clinic with any problems, questions or concerns. We can certainly see the patient much sooner if necessary.  Thank you  so much for allowing me to participate in the care of Bradley Goodman. I will continue to follow up the patient with you and assist in his care.  The total time spent in the appointment was 90 minutes.  Disclaimer: This note was dictated with voice recognition software. Similar sounding words can inadvertently be transcribed and may not be corrected upon review.   Eilleen Kempf April 25, 2021, 1:35 PM

## 2021-04-25 NOTE — Telephone Encounter (Signed)
Appointment scheduled.

## 2021-04-25 NOTE — Telephone Encounter (Signed)
Pts wife called requesting to speak directly with Hendricks Limes regarding pt.  Says Britney told them to call her anytime if they needed her.

## 2021-04-26 ENCOUNTER — Encounter: Payer: Self-pay | Admitting: *Deleted

## 2021-04-26 ENCOUNTER — Telehealth: Payer: Self-pay | Admitting: *Deleted

## 2021-04-26 ENCOUNTER — Encounter: Payer: Self-pay | Admitting: Family Medicine

## 2021-04-26 ENCOUNTER — Ambulatory Visit: Payer: 59 | Admitting: Family Medicine

## 2021-04-26 ENCOUNTER — Other Ambulatory Visit (HOSPITAL_COMMUNITY): Payer: Self-pay

## 2021-04-26 ENCOUNTER — Ambulatory Visit
Admission: RE | Admit: 2021-04-26 | Discharge: 2021-04-26 | Disposition: A | Discharge status: Home / Self Care | Payer: 59 | Source: Ambulatory Visit | Attending: Radiation Oncology | Admitting: Radiation Oncology

## 2021-04-26 VITALS — BP 127/95 | HR 100 | Temp 97.2°F | Resp 18

## 2021-04-26 DIAGNOSIS — C3411 Malignant neoplasm of upper lobe, right bronchus or lung: Secondary | ICD-10-CM | POA: Diagnosis not present

## 2021-04-26 DIAGNOSIS — C7931 Secondary malignant neoplasm of brain: Secondary | ICD-10-CM | POA: Diagnosis not present

## 2021-04-26 DIAGNOSIS — C3491 Malignant neoplasm of unspecified part of right bronchus or lung: Secondary | ICD-10-CM | POA: Diagnosis not present

## 2021-04-26 DIAGNOSIS — Z510 Encounter for antineoplastic radiation therapy: Secondary | ICD-10-CM | POA: Diagnosis not present

## 2021-04-26 DIAGNOSIS — R630 Anorexia: Secondary | ICD-10-CM | POA: Diagnosis not present

## 2021-04-26 DIAGNOSIS — G479 Sleep disorder, unspecified: Secondary | ICD-10-CM

## 2021-04-26 DIAGNOSIS — F320 Major depressive disorder, single episode, mild: Secondary | ICD-10-CM

## 2021-04-26 MED ORDER — MIRTAZAPINE 15 MG PO TABS
15.0000 mg | ORAL_TABLET | Freq: Every day | ORAL | 2 refills | Status: DC
  Filled 2021-04-26: qty 30, 30d supply, fill #0
  Filled 2021-06-01: qty 30, 30d supply, fill #1
  Filled 2021-06-30: qty 30, 30d supply, fill #2

## 2021-04-26 NOTE — Progress Notes (Signed)
Nurse monitoring complete status post 1 of 4 SRS treatments to pituitary. Patient without complaints. Patient denies new or worsening neurologic symptoms. Vitals stable. Instructed patient to avoid strenuous activity for the next 24 hours. Instructed patient to call [phone] with needs related to treatment after hours or over the weekend. Patient verbalized understanding. Ambulated out of clinic unassisted to spouse's vehicle without incident   Vitals:   04/26/21 1418  BP: (!) 127/95  Pulse: 100  Resp: 18  Temp: (!) 97.2 F (36.2 C)  SpO2: 99%

## 2021-04-26 NOTE — Telephone Encounter (Signed)
I followed up on Bradley Goodman schedule.  He is set up for radiation, chemo ed, and systemic therapy.  I called to see if he had any questions or concerns I could help with. He stated not at this time.  I asked to call me if needed.

## 2021-04-26 NOTE — Progress Notes (Signed)
Virtual Visit via Telephone Note  I connected with Lorenza Winkleman Burdell on 04/26/21 at 10:35 AM by telephone and verified that I am speaking with the correct person using two identifiers. Robb Sibal Matzen is currently located at home and his wife is currently with him during this visit. The provider, [name], FNP is located in their office at time of visit.  I discussed the limitations, risks, security and privacy concerns of performing an evaluation and management service by telephone and the availability of in person appointments. I also discussed with the patient that there may be a patient responsible charge related to this service. The patient expressed understanding and agreed to proceed.  Subjective: PCP: [name], FNP  Chief Complaint  Patient presents with   Depression   Patient is following up on some depression.  He just found out yesterday he has lung cancer with mets to his brain and pituitary gland.  He states he has not been sleeping well for the past week or more.  He is waking up an hour after going to sleep and cannot get back to sleep.  He does not have much of an appetite.  States he eats small amounts, feels full, and will eat anymore.   ROS: Per HPI  Current Outpatient Medications:    aspirin EC 81 MG tablet, Take 81 mg by mouth every evening., Disp: , Rfl:    cholecalciferol (VITAMIN D) 1000 UNITS tablet, Take 1,000 Units by mouth every evening., Disp: , Rfl:    folic acid (FOLVITE) 1 MG tablet, Take 1 tablet (1 mg total) by mouth daily., Disp: 30 tablet, Rfl: 4   ibuprofen (ADVIL) 200 MG tablet, Take 400 mg by mouth every 8 (eight) hours as needed (pain)., Disp: , Rfl:    Krill Oil 300 MG CAPS, Take 300 mg by mouth every evening., Disp: , Rfl:    loratadine (CLARITIN) 10 MG tablet, Take 10 mg by mouth every evening. , Disp: , Rfl:    losartan (COZAAR) 50 MG tablet, TAKE 1 AND 1/2 TABLETS BY MOUTH DAILY, Disp: 135 tablet, Rfl: 1   Multiple  Vitamins-Minerals (MULTIVITAMIN GUMMIES ADULT PO), Take 2 each by mouth daily., Disp: , Rfl:    pantoprazole (PROTONIX) 40 MG tablet, Take 1 tablet (40 mg total) by mouth every evening. (NEEDS TO BE SEEN BEFORE NEXT REFILL), Disp: 30 tablet, Rfl: 0   prochlorperazine (COMPAZINE) 10 MG tablet, Take 1 tablet (10 mg total) by mouth every 6 (six) hours as needed for nausea or vomiting., Disp: 30 tablet, Rfl: 0   rosuvastatin (CRESTOR) 20 MG tablet, Take 1 tablet (20 mg total) by mouth daily., Disp: 90 tablet, Rfl: 1   venlafaxine XR (EFFEXOR-XR) 75 MG 24 hr capsule, Take 1 capsule (75 mg total) by mouth daily with breakfast., Disp: 90 capsule, Rfl: 1  No Known Allergies Past Medical History:  Diagnosis Date   Cervical spondylolysis    Essential hypertension    GERD (gastroesophageal reflux disease)    History of kidney stones    History of migraine    Hyperlipidemia    PONV (postoperative nausea and vomiting)    Type 2 diabetes mellitus (HCC)     Observations/Objective: A&O  No respiratory distress or wheezing audible over the phone Mood, judgement, and thought processes all WNL   Assessment and Plan: 1-5. Current mild episode of major depressive disorder without prior episode (HCC)/Difficulty sleeping/Decreased appetite/Adenocarcinoma of right lung, stage 4 (HCC)/Brain metastasis (HCC) Worsened due  to recent diagnoses of lung cancer with metastasis. Started Remeron.  - mirtazapine (REMERON) 15 MG tablet; Take 1 tablet (15 mg total) by mouth at bedtime.  Dispense: 30 tablet; Refill: 2   Follow Up Instructions: Return in about 6 weeks (around 06/07/2021) for Remeron (telephone).  I discussed the assessment and treatment plan with the patient. The patient was provided an opportunity to ask questions and all were answered. The patient agreed with the plan and demonstrated an understanding of the instructions.   The patient was advised to call back or seek an in-person evaluation if the  symptoms worsen or if the condition fails to improve as anticipated.  The above assessment and management plan was discussed with the patient. The patient verbalized understanding of and has agreed to the management plan. Patient is aware to call the clinic if symptoms persist or worsen. Patient is aware when to return to the clinic for a follow-up visit. Patient educated on when it is appropriate to go to the emergency department.   Time call ended: 10:46 AM  I provided 11 minutes of non-face-to-face time during this encounter.  Hendricks Limes, MSN, APRN, FNP-C [facility] 04/26/21

## 2021-04-26 NOTE — Progress Notes (Signed)
    Oncology Nurse Navigator Flowsheets 04/26/2021  Abnormal Finding Date 03/27/2021  Confirmed Diagnosis Date 04/05/2021  Diagnosis Status Pending Molecular Studies  Planned Course of Treatment Radiation  Phase of Treatment Radiation  Radiation Actual Start Date: 04/24/2021  Radiation Expected End Date: 05/01/2021  Navigator Follow Up Date: 05/02/2021  Navigator Follow Up Reason: Designer, jewellery [facility]  Navigator Encounter Type Clinic/MDC;Initial MedOnc  Treatment Initiated Date 04/24/2021  Patient Visit Type MedOnc;Initial  Treatment Phase Pre-Tx/Tx Discussion  Barriers/Navigation Needs Education  Education Other;Understanding Cancer/ Treatment Options;Newly Diagnosed Cancer Education  Interventions Psycho-Social Support;Education  Acuity Level 2-Minimal Needs (1-2 Barriers Identified)  Education Method Verbal;Written

## 2021-04-27 ENCOUNTER — Encounter (HOSPITAL_COMMUNITY): Payer: Self-pay | Admitting: Internal Medicine

## 2021-04-27 DIAGNOSIS — C499 Malignant neoplasm of connective and soft tissue, unspecified: Secondary | ICD-10-CM | POA: Diagnosis not present

## 2021-04-28 ENCOUNTER — Ambulatory Visit
Admission: RE | Admit: 2021-04-28 | Discharge: 2021-04-28 | Disposition: A | Discharge status: Home / Self Care | Payer: 59 | Source: Ambulatory Visit | Attending: Radiation Oncology | Admitting: Radiation Oncology

## 2021-04-28 ENCOUNTER — Other Ambulatory Visit: Payer: Self-pay

## 2021-04-28 VITALS — BP 136/91 | HR 93 | Temp 96.8°F | Resp 18

## 2021-04-28 DIAGNOSIS — C3411 Malignant neoplasm of upper lobe, right bronchus or lung: Secondary | ICD-10-CM | POA: Diagnosis not present

## 2021-04-28 DIAGNOSIS — Z510 Encounter for antineoplastic radiation therapy: Secondary | ICD-10-CM | POA: Diagnosis not present

## 2021-04-28 DIAGNOSIS — C7931 Secondary malignant neoplasm of brain: Secondary | ICD-10-CM | POA: Diagnosis not present

## 2021-04-28 NOTE — Progress Notes (Signed)
Nurse monitoring complete status post 2 of 4 SRS treatments to pituitary. Patient without complaints. Patient denies new or worsening neurologic symptoms. Vitals stable. Instructed patient to avoid strenuous activity for the next 24 hours. Instructed patient to call [phone] with needs related to treatment after hours or over the weekend. Patient verbalized understanding. Ambulated out of clinic unassisted to spouse's vehicle without incident    BP (!) 136/91   Pulse 93   Temp (!) 96.8 F (36 C)   Resp 18   SpO2 98%    Bradley Goodman M. Leonie Green, BSN

## 2021-05-01 ENCOUNTER — Other Ambulatory Visit: Payer: Self-pay

## 2021-05-01 ENCOUNTER — Ambulatory Visit
Admission: RE | Admit: 2021-05-01 | Discharge: 2021-05-01 | Disposition: A | Discharge status: Home / Self Care | Payer: 59 | Source: Ambulatory Visit | Attending: Radiation Oncology | Admitting: Radiation Oncology

## 2021-05-01 ENCOUNTER — Encounter: Payer: Self-pay | Admitting: *Deleted

## 2021-05-01 ENCOUNTER — Inpatient Hospital Stay: Payer: 59

## 2021-05-01 ENCOUNTER — Encounter: Payer: Self-pay | Admitting: Internal Medicine

## 2021-05-01 VITALS — BP 123/85 | HR 102 | Temp 96.7°F | Resp 18

## 2021-05-01 DIAGNOSIS — C7931 Secondary malignant neoplasm of brain: Secondary | ICD-10-CM | POA: Diagnosis not present

## 2021-05-01 DIAGNOSIS — Z510 Encounter for antineoplastic radiation therapy: Secondary | ICD-10-CM | POA: Diagnosis not present

## 2021-05-01 DIAGNOSIS — C3411 Malignant neoplasm of upper lobe, right bronchus or lung: Secondary | ICD-10-CM | POA: Diagnosis not present

## 2021-05-01 NOTE — Addendum Note (Signed)
Encounter addended by: Zola Button, RN on: 05/01/2021 3:52 PM  Actions taken: Vitals modified, Clinical Note Signed

## 2021-05-01 NOTE — Progress Notes (Signed)
Met with patient at registration to introduce myself as Financial Resource Specialist and to offer available resources.  Discussed one-time $1000 [employer] grant and qualifications to assist with personal expenses while going through treatment.  Gave her my card if interested in applying and for any additional financial questions or concerns.   

## 2021-05-01 NOTE — Progress Notes (Signed)
Pharmacist Chemotherapy Monitoring - Initial Assessment    Anticipated start date: 05/08/21    The following has been reviewed per standard work regarding the patient's treatment regimen: The patient's diagnosis, treatment plan and drug doses, and organ/hematologic function Lab orders and baseline tests specific to treatment regimen  The treatment plan start date, drug sequencing, and pre-medications Prior authorization status  Patient's documented medication list, including drug-drug interaction screen and prescriptions for anti-emetics and supportive care specific to the treatment regimen The drug concentrations, fluid compatibility, administration routes, and timing of the medications to be used The patient's access for treatment and lifetime cumulative dose history, if applicable  The patient's medication allergies and previous infusion related reactions, if applicable   Changes made to treatment plan:  N/A  Follow up needed:  N/A   Larene Beach, RPH, 05/01/2021  11:59 AM

## 2021-05-01 NOTE — Progress Notes (Signed)
Nurse monitoring complete status post 3 of 4 SRS treatments to pituitary. Patient without complaints. Patient denies new or worsening neurologic symptoms. Vitals stable. Instructed patient to avoid strenuous activity for the next 24 hours. Instructed patient to call [phone] with needs related to treatment after hours or over the weekend. Patient verbalized understanding. Ambulated out of clinic unassisted to next appointment (chemo education class) without incident   Vitals:   05/01/21 1520  BP: 123/85  Pulse: (!) 102  Resp: 18  Temp: (!) 96.7 F (35.9 C)  SpO2: 100%

## 2021-05-01 NOTE — Progress Notes (Signed)
I followed up on [name] molecular test results. I printed and will place on Dr. Worthy Flank desk for review.

## 2021-05-04 ENCOUNTER — Other Ambulatory Visit: Payer: Self-pay | Admitting: Radiation Oncology

## 2021-05-04 ENCOUNTER — Ambulatory Visit
Admission: RE | Admit: 2021-05-04 | Discharge: 2021-05-04 | Disposition: A | Discharge status: Home / Self Care | Payer: 59 | Source: Ambulatory Visit | Attending: Radiation Oncology | Admitting: Radiation Oncology

## 2021-05-04 ENCOUNTER — Telehealth: Payer: Self-pay

## 2021-05-04 ENCOUNTER — Encounter: Payer: Self-pay | Admitting: Radiation Oncology

## 2021-05-04 VITALS — BP 124/86 | HR 108 | Temp 96.9°F | Resp 18

## 2021-05-04 DIAGNOSIS — C7931 Secondary malignant neoplasm of brain: Secondary | ICD-10-CM | POA: Diagnosis not present

## 2021-05-04 DIAGNOSIS — Z510 Encounter for antineoplastic radiation therapy: Secondary | ICD-10-CM | POA: Diagnosis not present

## 2021-05-04 DIAGNOSIS — C3411 Malignant neoplasm of upper lobe, right bronchus or lung: Secondary | ICD-10-CM | POA: Diagnosis not present

## 2021-05-04 NOTE — Progress Notes (Signed)
Nurse monitoring complete status post 4 of 4 SRS treatments to the pituitary. Patient without complaints. Patient denies new or worsening neurologic symptoms. Vitals stable. Instructed patient to avoid strenuous activity for the next 24 hours. Instructed patient to call [phone] with needs related to treatment after hours or over the weekend. Patient verbalized understanding. 1 month F/U card with Ashlyn Bruning PA-C provided. Patient ambulated out of clinic unassisted to spouse's vehicle  Vitals:   05/04/21 1530  BP: 124/86  Pulse: (!) 108  Resp: 18  Temp: (!) 96.9 F (36.1 C)  SpO2: 100%

## 2021-05-04 NOTE — Telephone Encounter (Signed)
Message received from [place] w/[facility] request last office note.   I have faxed the progress note as requested.

## 2021-05-04 NOTE — Progress Notes (Signed)
[facility] OFFICE PROGRESS NOTE  Bradley Goodman, Bradley Goodman  DIAGNOSIS: Stage IV (T1b, N3, M1C) non-small cell lung cancer, favoring adenocarcinoma presented with right upper lobe lung nodule in addition to right hilar, subcarinal and bilateral mediastinal as well as supraclavicular lymphadenopathy in addition to bone and brain metastasis diagnosed in June 2022.   PD-L1 expression 80%.    Molecular Studies:  Biomarker Findings Microsatellite status - MS-Stable Tumor Mutational Burden - 6 Muts/Mb Genomic Findings For a complete list of the genes assayed, please refer to the Appendix. KRAS G12C, amplification ATM S470* CCND1 amplification - equivocal? HGF amplification - equivocal? MYC amplification - equivocal? FGF19 amplification - equivocal? FGF3 amplification - equivocal? FGF4 amplification - equivocal? NFKBIA amplification NKX2-1 amplification RAD21 amplification - equivocal? [id number]*26 TERT promoter -124C>T TP53 rearrangement exon 9 7 Disease relevant genes with no reportable alterations: ALK, BRAF, EGFR, ERBB2, MET, RET, ROS1  PRIOR THERAPY: SRS to the metastatic brain lesions under the care of Dr. MTammi Klippel  Last treatment on 05/04/2021.  CURRENT THERAPY: Palliative systemic chemotherapy with carboplatin for an AUC 5, Alimta 500 mg per metered squared, Keytruda 200 mg IV every 3 weeks.  First dose expected on 05/08/2021.  INTERVAL HISTORY: Bradley Goodman 65y.o. male returns to the clinic today for follow-up visit.  The patient was recently diagnosed with stage IV lung cancer.  He completed SRS to the brain lesions under the care of Dr. MTammi Klippellast week on 05/04/2021.  He tolerated this well. He notes some fatigue and nausea. He has compazine for nausea but has only taken this once. He also notes some peripheral vision changes. He has a one month follow up visit with them next month on 06/08/21.    The patient had  molecular studies performed to see if he has any actionable mutations.  He has K-ras G12C which can be used in the second line setting in the future.  Therefore, he is planning on undergoing his first cycle of systemic chemotherapy today.  The patient denies any recent fever. He reports he has felt cold since his radiation. He has had a decreased appetite and associated weight loss but states his appetite is starting to "come back".  He is currently taking Remeron.  He reports mild cough that he describes as clearing his throat. He denies any chest pain, shortness of breath, or hemoptysis.  He denies any vomiting, diarrhea, or constipation.  He sometimes has a mild headache but not bad enough to warrant any tylenol. The patient is here today for evaluation and repeat blood work before undergoing his first cycle of treatment.   MEDICAL HISTORY: Past Medical History:  Diagnosis Date   Cervical spondylolysis    Essential hypertension    GERD (gastroesophageal reflux disease)    History of kidney stones    History of migraine    Hyperlipidemia    PONV (postoperative nausea and vomiting)    Type 2 diabetes mellitus (HCC)     ALLERGIES:  has No Known Allergies.  MEDICATIONS:  Current Outpatient Medications  Medication Sig Dispense Refill   aspirin EC 81 MG tablet Take 81 mg by mouth every evening.     cholecalciferol (VITAMIN D) 1000 UNITS tablet Take 1,000 Units by mouth every evening.     folic acid (FOLVITE) 1 MG tablet Take 1 tablet (1 mg total) by mouth daily. 30 tablet 4   ibuprofen (ADVIL) 200 MG tablet Take 400 mg by mouth  every 8 (eight) hours as needed (pain).     Krill Oil 300 MG CAPS Take 300 mg by mouth every evening.     loratadine (CLARITIN) 10 MG tablet Take 10 mg by mouth every evening.      losartan (COZAAR) 50 MG tablet TAKE 1 AND 1/2 TABLETS BY MOUTH DAILY 135 tablet 1   mirtazapine (REMERON) 15 MG tablet Take 1 tablet (15 mg total) by mouth at bedtime. 30 tablet 2    Multiple Vitamins-Minerals (MULTIVITAMIN GUMMIES ADULT PO) Take 2 each by mouth daily.     pantoprazole (PROTONIX) 40 MG tablet Take 1 tablet (40 mg total) by mouth every evening. (NEEDS TO BE SEEN BEFORE NEXT REFILL) 30 tablet 0   prochlorperazine (COMPAZINE) 10 MG tablet Take 1 tablet (10 mg total) by mouth every 6 (six) hours as needed for nausea or vomiting. 30 tablet 0   rosuvastatin (CRESTOR) 20 MG tablet Take 1 tablet (20 mg total) by mouth daily. 90 tablet 1   venlafaxine XR (EFFEXOR-XR) 75 MG 24 hr capsule Take 1 capsule (75 mg total) by mouth daily with breakfast. 90 capsule 1   No current facility-administered medications for this visit.    SURGICAL HISTORY:  Past Surgical History:  Procedure Laterality Date   Bilateral inguinal hernia repair     BRONCHIAL NEEDLE ASPIRATION BIOPSY  04/05/2021   Procedure: BRONCHIAL NEEDLE ASPIRATION BIOPSIES;  Surgeon: Garner Nash, DO;  Location: [facility];  Service: Pulmonary;;   COLONOSCOPY  01/23/2012   Procedure: COLONOSCOPY;  Surgeon: Daneil Dolin, MD;  [facility];  Service: Endoscopy;  Laterality: N/A;  9:30 AM   COLONOSCOPY N/A 10/11/2015   Procedure: COLONOSCOPY;  Surgeon: Daneil Dolin, MD;  [facility];  Service: Endoscopy;  Laterality: N/A;  830   COLONOSCOPY N/A 10/14/2019   Procedure: COLONOSCOPY;  Surgeon: Daneil Dolin, MD;  [facility];  Service: Endoscopy;  Laterality: N/A;  1:45   POLYPECTOMY  10/14/2019   Procedure: POLYPECTOMY;  Surgeon: Daneil Dolin, MD;  [facility];  Service: Endoscopy;;  ascending colon, descending colon   VIDEO BRONCHOSCOPY WITH ENDOBRONCHIAL ULTRASOUND N/A 04/05/2021   Procedure: VIDEO BRONCHOSCOPY WITH ENDOBRONCHIAL ULTRASOUND;  Surgeon: Garner Nash, DO;  Location: [facility];  Service: Pulmonary;  Laterality: N/A;    REVIEW OF SYSTEMS:   Review of Systems  Constitutional: Positive for fatigue, weight loss, decreased appetite, and weight  loss. Negative for fever. HENT: Negative for mouth sores, nosebleeds, sore throat and trouble swallowing.   Eyes: Negative for eye problems and icterus.  Respiratory: Positive for mild cough. Negative for hemoptysis, shortness of breath and wheezing.   Cardiovascular: Negative for chest pain and leg swelling.  Gastrointestinal:  Positive for nausea. Negative for abdominal pain, constipation, diarrhea, and vomiting.  Genitourinary: Negative for bladder incontinence, difficulty urinating, dysuria, frequency and hematuria.   Musculoskeletal: Negative for back pain, gait problem, neck pain and neck stiffness.  Skin: Negative for itching and rash.  Neurological: Positive for mild headache. Negative for dizziness, extremity weakness, gait problem, light-headedness and seizures.  Hematological: Negative for adenopathy. Does not bruise/bleed easily.  Psychiatric/Behavioral: Negative for confusion, depression and sleep disturbance. The patient is not nervous/anxious.     PHYSICAL EXAMINATION:  Blood pressure 107/90, pulse (!) 108, temperature 99.2 F (37.3 C), temperature source Oral, resp. rate 18, weight 157 lb 12.8 oz (71.6 kg), SpO2 97 %.  ECOG PERFORMANCE STATUS: 1  Physical Exam  Constitutional:  Oriented to person, place, and time and thin appearing male and in no distress.   HENT:  Head: Normocephalic and atraumatic.  Mouth/Throat: Oropharynx is clear and moist. No oropharyngeal exudate.  Eyes: Conjunctivae are normal. Right eye exhibits no discharge. Left eye exhibits no discharge. No scleral icterus.  Neck: Normal range of motion. Neck supple.  Cardiovascular: Normal rate, regular rhythm, normal heart sounds and intact distal pulses.   Pulmonary/Chest: Effort normal and breath sounds normal. No respiratory distress. No wheezes. No rales.  Abdominal: Soft. Bowel sounds are normal. Exhibits no distension and no mass. There is no tenderness.  Musculoskeletal: Normal range of motion.  Exhibits no edema.  Lymphadenopathy:    No cervical adenopathy.  Neurological: Alert and oriented to person, place, and time. Exhibits normal muscle tone. Gait normal. Coordination normal.  Skin: Skin is warm and dry. No rash noted. Not diaphoretic. No erythema. No pallor.  Psychiatric: Mood, memory and judgment normal.  Vitals reviewed.  LABORATORY DATA: Lab Results  Component Value Date   WBC 9.4 05/08/2021   HGB 14.5 05/08/2021   HCT 43.5 05/08/2021   MCV 88.4 05/08/2021   PLT 236 05/08/2021      Chemistry      Component Value Date/Time   NA 142 05/08/2021 0807   NA 144 01/10/2021 0849   K 4.1 05/08/2021 0807   CL 109 05/08/2021 0807   CO2 23 05/08/2021 0807   BUN 9 05/08/2021 0807   BUN 15 01/10/2021 0849   CREATININE 0.99 05/08/2021 0807      Component Value Date/Time   CALCIUM 9.7 05/08/2021 0807   ALKPHOS 61 05/08/2021 0807   AST 19 05/08/2021 0807   ALT 17 05/08/2021 0807   BILITOT 0.3 05/08/2021 0807       RADIOGRAPHIC STUDIES:  MR Brain W Wo Contrast  Result Date: 04/18/2021 CLINICAL DATA:  Brain/CNS neoplasm. EXAM: MRI HEAD WITHOUT AND WITH CONTRAST TECHNIQUE: Multiplanar, multiecho pulse sequences of the brain and surrounding structures were obtained without and with intravenous contrast. CONTRAST:  64m MULTIHANCE GADOBENATE DIMEGLUMINE 529 MG/ML IV SOLN COMPARISON:  June 20/21 MRI (postcontrast imaging performed on June 21). FINDINGS: Brain: Slight increase in size of a 7 mm peripherally enhancing metastatic lesion in the right parietal lobe (series 12, image 93), previously 5 mm. Interval increase in size of a 5 mm enhancing metastasis in the left cerebellum (series 12, image 49), previously punctate. Increased associated edema without significant mass effect. New punctate focus of enhancement in the high anterior right frontal lobe (series 12, image 127) and the left parietal lobe (series 12, image 112; series 14, image 31) No substantial change in abnormal  enhancement involving the pituitary infundibulum, hypothalamus, and posterior pituitary, which was further evaluated on prior postcontrast pituitary protocol imaging from June 21 22. No acute infarct, acute hemorrhage, hydrocephalus, midline shift, basal cistern effacement, or extra-axial fluid collection. Moderate scattered T2/FLAIR hyperintensities within the white matter, nonspecific but most likely related to chronic microvascular ischemic disease. Vascular: Major arterial flow voids are maintained at the skull base. Skull and upper cervical spine: Scattered small foci of enhancement in the calvarium (for example, series 12, images 93, 114 and 134 and the left posterior calvarium) with very faint FLAIR hyperintensity in these regions. Sinuses/Orbits: Mild ethmoid air cell mucosal thickening. Unremarkable orbits. Other: No mastoid effusions. IMPRESSION: 1. Increased 7 mm metastasis in the right parietal lobe metastasis (previously 5 mm) and 5 mm metastasis in the left cerebellum (previously punctate). Increased associated edema  without significant mass effect. 2. New punctate focus of enhancement in the left parietal lobe (series 12, image 112), which is suspicious for new punctate metastasis. 3. New punctate focus of enhancement in the high anterior right frontal lobe (series 12, image 127), which could represent a punctate metastasis versus cortical vessel. 4. No substantial change in abnormal enhancement involving the pituitary infundibulum, hypothalamus, and posterior pituitary, which was further evaluated on prior postcontrast pituitary protocol imaging from June 21, 22. Please see that study for differential considerations. 5. Multiple small foci of enhancement in the calvarium, which are not specific but could represent early osseous metastases. Recommend attention on follow-up. Electronically Signed   By: Margaretha Sheffield MD   On: 04/18/2021 08:02     ASSESSMENT/PLAN:  This is a very pleasant  65 year old Caucasian male diagnosed with stage IV (T1b, N3, M1 C) non-small cell lung cancer, favoring adenocarcinoma.  The patient presented with a right upper lobe lung nodule in addition to right hilar, subcarinal, and bilateral mediastinal lymphadenopathy as well as supraclavicular lymphadenopathy.  The patient also has metastatic disease to the brain.  He was diagnosed in June 2022.  His PD-L1 expression is 80%.  The patient's molecular studies show that he has a K-ras G 12 C mutation which can be used for targeted treatment in the second line setting.  The patient completed SRS to the brain lesions under the care of Dr. Tammi Klippel.  His last treatment was on 05/04/2021.  The patient is currently undergoing systemic chemotherapy with carboplatin for an AUC of 5, Alimta 500 mg per metered squared, Keytruda 200 mg IV every 3 weeks.  He is expected to undergo his first cycle of treatment today.  The patient was seen with Dr. Julien Nordmann.  Labs reviewed.  Recommend that he proceed with cycle #1 today as scheduled.  We will see him back for follow-up visit in 1 week for evaluation and for 1 week follow-up visit to manage any adverse side effects of treatment.  He will continue to take remeron for his insomnia and decreased appetite.   The patient was advised to call immediately if she has any concerning symptoms in the interval. The patient voices understanding of current disease status and treatment options and is in agreement with the current care plan. All questions were answered. The patient knows to call the clinic with any problems, questions or concerns. We can certainly see the patient much sooner if necessary       Orders Placed This Encounter  Procedures   CBC with Differential ([facility] Only)    Standing Status:   Standing    Number of Occurrences:   12    Standing Expiration Date:   05/08/2022   CMP ([place] [place] only)    Standing Status:   Standing    Number of Occurrences:    12    Standing Expiration Date:   05/08/2022   TSH    Standing Status:   Standing    Number of Occurrences:   4    Standing Expiration Date:   05/08/2022       Bradley Causby L Ulice Follett, PA-C 05/08/21  ADDENDUM: Hematology/Oncology Attending: I had a face-to-face encounter with the patient today.  I reviewed his record, lab, scan and recommended his care plan.  This is a very pleasant 65 years old white male recently diagnosed with stage IV (T1b, N3, M1 C) non-small cell lung cancer, favoring adenocarcinoma presented with right upper lobe lung nodule in addition to  right hilar, subcarinal, bilateral mediastinal lymphadenopathy as well as supraclavicular lymphadenopathy and metastatic disease to the brain diagnosed in June 2022 with PD-L1 expression of 80%.  The molecular studies showed KRAS G12C mutation. The patient underwent SRS to the brain lesions under the care of Dr. Tammi Klippel. He is here today for evaluation and discussion of his treatment options. I had a lengthy discussion with the patient and his wife today about his current condition and treatment options.  I explained to the patient that he has incurable condition and all the treatment will be of palliative nature. I explained to the patient that the KRAS G12C mutation is actionable but on the second line setting. I gave the patient the option of palliative care and hospice versus consideration of palliative systemic chemotherapy with carboplatin for AUC of 5, Alimta 500 Mg/M2 and Keytruda 200 Mg IV every 3 weeks. The patient is interested in proceeding with the systemic chemotherapy. He is expected to start the first cycle of this treatment today.  We already discussed the adverse effect of this treatment with the patient last week. He will come back for follow-up visit in 1 week for evaluation and management of any adverse effect of his treatment. The patient was advised to call immediately if he has any concerning symptoms in the  interval.  Disclaimer: This note was dictated with voice recognition software. Similar sounding words can inadvertently be transcribed and may be missed upon review. Eilleen Kempf, MD 05/08/21

## 2021-05-04 NOTE — Telephone Encounter (Signed)
FMLA paperwork completed. Fax transmission confirmation received and copy of paperwork placed at front registration for pick up as requested

## 2021-05-05 ENCOUNTER — Other Ambulatory Visit (HOSPITAL_COMMUNITY): Payer: Self-pay

## 2021-05-05 ENCOUNTER — Encounter: Payer: Self-pay | Admitting: Internal Medicine

## 2021-05-08 ENCOUNTER — Inpatient Hospital Stay: Payer: 59 | Attending: Physician Assistant | Admitting: Physician Assistant

## 2021-05-08 ENCOUNTER — Inpatient Hospital Stay: Payer: 59

## 2021-05-08 ENCOUNTER — Other Ambulatory Visit: Payer: Self-pay

## 2021-05-08 VITALS — HR 100

## 2021-05-08 VITALS — BP 107/90 | HR 108 | Temp 99.2°F | Resp 18 | Wt 157.8 lb

## 2021-05-08 DIAGNOSIS — Z5112 Encounter for antineoplastic immunotherapy: Secondary | ICD-10-CM | POA: Diagnosis not present

## 2021-05-08 DIAGNOSIS — C7931 Secondary malignant neoplasm of brain: Secondary | ICD-10-CM | POA: Diagnosis not present

## 2021-05-08 DIAGNOSIS — C7951 Secondary malignant neoplasm of bone: Secondary | ICD-10-CM | POA: Insufficient documentation

## 2021-05-08 DIAGNOSIS — C3491 Malignant neoplasm of unspecified part of right bronchus or lung: Secondary | ICD-10-CM | POA: Diagnosis not present

## 2021-05-08 DIAGNOSIS — C3411 Malignant neoplasm of upper lobe, right bronchus or lung: Secondary | ICD-10-CM | POA: Diagnosis not present

## 2021-05-08 DIAGNOSIS — Z5111 Encounter for antineoplastic chemotherapy: Secondary | ICD-10-CM | POA: Insufficient documentation

## 2021-05-08 LAB — TSH: TSH: 0.849 u[IU]/mL (ref 0.320–4.118)

## 2021-05-08 LAB — CBC WITH DIFFERENTIAL (CANCER CENTER ONLY)
Abs Immature Granulocytes: 0.03 10*3/uL (ref 0.00–0.07)
Basophils Absolute: 0.1 10*3/uL (ref 0.0–0.1)
Basophils Relative: 1 %
Eosinophils Absolute: 0.3 10*3/uL (ref 0.0–0.5)
Eosinophils Relative: 3 %
HCT: 43.5 % (ref 39.0–52.0)
Hemoglobin: 14.5 g/dL (ref 13.0–17.0)
Immature Granulocytes: 0 %
Lymphocytes Relative: 25 %
Lymphs Abs: 2.4 10*3/uL (ref 0.7–4.0)
MCH: 29.5 pg (ref 26.0–34.0)
MCHC: 33.3 g/dL (ref 30.0–36.0)
MCV: 88.4 fL (ref 80.0–100.0)
Monocytes Absolute: 0.6 10*3/uL (ref 0.1–1.0)
Monocytes Relative: 6 %
Neutro Abs: 6.2 10*3/uL (ref 1.7–7.7)
Neutrophils Relative %: 65 %
Platelet Count: 236 10*3/uL (ref 150–400)
RBC: 4.92 MIL/uL (ref 4.22–5.81)
RDW: 13.8 % (ref 11.5–15.5)
WBC Count: 9.4 10*3/uL (ref 4.0–10.5)
nRBC: 0 % (ref 0.0–0.2)

## 2021-05-08 LAB — CMP (CANCER CENTER ONLY)
ALT: 17 U/L (ref 0–44)
AST: 19 U/L (ref 15–41)
Albumin: 3.6 g/dL (ref 3.5–5.0)
Alkaline Phosphatase: 61 U/L (ref 38–126)
Anion gap: 10 (ref 5–15)
BUN: 9 mg/dL (ref 8–23)
CO2: 23 mmol/L (ref 22–32)
Calcium: 9.7 mg/dL (ref 8.9–10.3)
Chloride: 109 mmol/L (ref 98–111)
Creatinine: 0.99 mg/dL (ref 0.61–1.24)
GFR, Estimated: >60 mL/min (ref >60)
Glucose, Bld: 105 mg/dL — ABNORMAL HIGH (ref 70–99)
Potassium: 4.1 mmol/L (ref 3.5–5.1)
Sodium: 142 mmol/L (ref 135–145)
Total Bilirubin: 0.3 mg/dL (ref 0.3–1.2)
Total Protein: 6.7 g/dL (ref 6.5–8.1)

## 2021-05-08 MED ORDER — SODIUM CHLORIDE 0.9 % IV SOLN
500.0000 mg/m2 | Freq: Once | INTRAVENOUS | Status: AC
  Administered 2021-05-08: 1000 mg via INTRAVENOUS
  Filled 2021-05-08: qty 40

## 2021-05-08 MED ORDER — PALONOSETRON HCL INJECTION 0.25 MG/5ML
INTRAVENOUS | Status: AC
  Filled 2021-05-08: qty 5

## 2021-05-08 MED ORDER — SODIUM CHLORIDE 0.9 % IV SOLN
507.5000 mg | Freq: Once | INTRAVENOUS | Status: AC
  Administered 2021-05-08: 510 mg via INTRAVENOUS
  Filled 2021-05-08: qty 51

## 2021-05-08 MED ORDER — SODIUM CHLORIDE 0.9 % IV SOLN
Freq: Once | INTRAVENOUS | Status: AC
  Filled 2021-05-08: qty 250

## 2021-05-08 MED ORDER — SODIUM CHLORIDE 0.9 % IV SOLN
10.0000 mg | Freq: Once | INTRAVENOUS | Status: AC
  Administered 2021-05-08: 10 mg via INTRAVENOUS
  Filled 2021-05-08: qty 10

## 2021-05-08 MED ORDER — SODIUM CHLORIDE 0.9 % IV SOLN
150.0000 mg | Freq: Once | INTRAVENOUS | Status: AC
  Administered 2021-05-08: 150 mg via INTRAVENOUS
  Filled 2021-05-08: qty 150

## 2021-05-08 MED ORDER — SODIUM CHLORIDE 0.9 % IV SOLN
200.0000 mg | Freq: Once | INTRAVENOUS | Status: AC
  Administered 2021-05-08: 200 mg via INTRAVENOUS
  Filled 2021-05-08: qty 8

## 2021-05-08 MED ORDER — PALONOSETRON HCL INJECTION 0.25 MG/5ML
0.2500 mg | Freq: Once | INTRAVENOUS | Status: AC
  Administered 2021-05-08: 0.25 mg via INTRAVENOUS

## 2021-05-08 NOTE — Patient Instructions (Signed)
[facility]  Discharge Instructions: Thank you for choosing [facility] to provide your oncology and hematology care.   If you have a lab appointment with the [facility], please go directly to the [facility] and check in at the registration area.   Wear comfortable clothing and clothing appropriate for easy access to any Portacath or PICC line.   We strive to give you quality time with your provider. You may need to reschedule your appointment if you arrive late (15 or more minutes).  Arriving late affects you and other patients whose appointments are after yours.  Also, if you miss three or more appointments without notifying the office, you may be dismissed from the clinic at the provider's discretion.      For prescription refill requests, have your pharmacy contact our office and allow 72 hours for refills to be completed.    Today you received the following chemotherapy and/or immunotherapy agents Keytruda, Alimta, and Carboplatin      To help prevent nausea and vomiting after your treatment, we encourage you to take your nausea medication as directed.  BELOW ARE SYMPTOMS THAT SHOULD BE REPORTED IMMEDIATELY: *FEVER GREATER THAN 100.4 F (38 C) OR HIGHER *CHILLS OR SWEATING *NAUSEA AND VOMITING THAT IS NOT CONTROLLED WITH YOUR NAUSEA MEDICATION *UNUSUAL SHORTNESS OF BREATH *UNUSUAL BRUISING OR BLEEDING *URINARY PROBLEMS (pain or burning when urinating, or frequent urination) *BOWEL PROBLEMS (unusual diarrhea, constipation, pain near the anus) TENDERNESS IN MOUTH AND THROAT WITH OR WITHOUT PRESENCE OF ULCERS (sore throat, sores in mouth, or a toothache) UNUSUAL RASH, SWELLING OR PAIN  UNUSUAL VAGINAL DISCHARGE OR ITCHING   Items with * indicate a potential emergency and should be followed up as soon as possible or go to the Emergency Department if any problems should occur.  Please show the CHEMOTHERAPY ALERT CARD or IMMUNOTHERAPY  ALERT CARD at check-in to the Emergency Department and triage nurse.  Should you have questions after your visit or need to cancel or reschedule your appointment, please contact [facility]  Dept: [phone]  and follow the prompts.  Office hours are 8:00 a.m. to 4:30 p.m. Monday - Friday. Please note that voicemails left after 4:00 p.m. may not be returned until the following business day.  We are closed weekends and major holidays. You have access to a nurse at all times for urgent questions. Please call the main number to the clinic Dept: [phone] and follow the prompts.   For any non-urgent questions, you may also contact your provider using MyChart. We now offer e-Visits for anyone 26 and older to request care online for non-urgent symptoms. For details visit [email or web address].   Also download the MyChart app! Go to the app store, search "MyChart", open the app, select [facility], and log in with your MyChart username and password.  Due to Covid, a mask is required upon entering the hospital/clinic. If you do not have a mask, one will be given to you upon arrival. For doctor visits, patients may have 1 support person aged 63 or older with them. For treatment visits, patients cannot have anyone with them due to current Covid guidelines and our immunocompromised population.

## 2021-05-09 ENCOUNTER — Other Ambulatory Visit: Payer: Self-pay | Admitting: Radiation Therapy

## 2021-05-09 DIAGNOSIS — C729 Malignant neoplasm of central nervous system, unspecified: Secondary | ICD-10-CM

## 2021-05-10 ENCOUNTER — Telehealth: Payer: Self-pay | Admitting: Physician Assistant

## 2021-05-10 ENCOUNTER — Other Ambulatory Visit (HOSPITAL_COMMUNITY)
Admission: RE | Admit: 2021-05-10 | Discharge: 2021-05-10 | Disposition: A | Discharge status: Home / Self Care | Payer: 59 | Source: Ambulatory Visit | Attending: Radiation Oncology | Admitting: Radiation Oncology

## 2021-05-10 ENCOUNTER — Other Ambulatory Visit: Payer: Self-pay

## 2021-05-10 DIAGNOSIS — C729 Malignant neoplasm of central nervous system, unspecified: Secondary | ICD-10-CM | POA: Insufficient documentation

## 2021-05-10 LAB — GLUCOSE, FASTING: Glucose, Fasting: 97 mg/dL (ref 70–99)

## 2021-05-10 LAB — CORTISOL: Cortisol, Plasma: 1.8 ug/dL

## 2021-05-10 LAB — TSH: TSH: 0.489 u[IU]/mL (ref 0.350–4.500)

## 2021-05-10 NOTE — Telephone Encounter (Signed)
Scheduled one week f/u per los. Called and spoke with patient. Confirfmed appt

## 2021-05-11 ENCOUNTER — Encounter: Payer: Self-pay | Admitting: Radiation Therapy

## 2021-05-11 ENCOUNTER — Other Ambulatory Visit: Payer: Self-pay | Admitting: Radiation Therapy

## 2021-05-11 DIAGNOSIS — C7931 Secondary malignant neoplasm of brain: Secondary | ICD-10-CM

## 2021-05-11 LAB — FOLLICLE STIMULATING HORMONE: FSH: 0.4 m[IU]/mL — ABNORMAL LOW (ref 1.5–12.4)

## 2021-05-11 LAB — LUTEINIZING HORMONE: LH: <0.3 m[IU]/mL — ABNORMAL LOW (ref 1.7–8.6)

## 2021-05-11 LAB — T4: T4, Total: 5.5 ug/dL (ref 4.5–12.0)

## 2021-05-11 LAB — TESTOSTERONE: Testosterone: <3 ng/dL — ABNORMAL LOW (ref 264–916)

## 2021-05-11 LAB — INSULIN-LIKE GROWTH FACTOR: Somatomedin C: 115 ng/mL (ref 64–240)

## 2021-05-11 LAB — PROLACTIN: Prolactin: 27 ng/mL — ABNORMAL HIGH (ref 4.0–15.2)

## 2021-05-11 NOTE — Progress Notes (Signed)
Bradley Goodman has poor cell service where he lives, so we have had better luck communicating with emails. I have entered a referral to [facility] and sent the below message to Bradley Goodman to make him aware.   Mont Dutton R.T.(R)(T) Radiation Special Procedures Navigator  -------------------------------------------------------------------------------------------- Hey Bradley Goodman, I just wanted to let you know that Dr. Tammi Klippel would like you to see an endocrinologist for follow-up. I have placed a referral to [facility], Dr. Dorris Fetch. Please let me know if you have not heard from his office by Tuesday 8/9 to set up a consult visit.      Thank you, Mont Dutton R.T.(R)(T) [facility]  Radiation Special Procedures Navigator Office: [phone]   Pager: [phone]  [facility] Website: [email or web address]

## 2021-05-11 NOTE — Progress Notes (Signed)
Referral requested by Dr. Tammi Klippel for pt to see Dr. Sharlynn Oliphant, endocrinologist. Order entered 05/11/21.      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  He does have some baseline dysfunction with very low testosterone. Referral to endocrinology for management due to his high likelihood of hormonal axis disruption in the future.  Mont Dutton R.T.(R)(T) Radiation Special Procedures Navigator

## 2021-05-15 ENCOUNTER — Encounter: Payer: Self-pay | Admitting: Internal Medicine

## 2021-05-15 ENCOUNTER — Inpatient Hospital Stay: Payer: 59

## 2021-05-15 ENCOUNTER — Other Ambulatory Visit: Payer: Self-pay

## 2021-05-15 ENCOUNTER — Inpatient Hospital Stay: Payer: 59 | Admitting: Internal Medicine

## 2021-05-15 VITALS — BP 129/84 | HR 100 | Temp 97.5°F | Resp 18 | Ht 72.0 in | Wt 158.5 lb

## 2021-05-15 DIAGNOSIS — I10 Essential (primary) hypertension: Secondary | ICD-10-CM

## 2021-05-15 DIAGNOSIS — C3491 Malignant neoplasm of unspecified part of right bronchus or lung: Secondary | ICD-10-CM

## 2021-05-15 DIAGNOSIS — C7951 Secondary malignant neoplasm of bone: Secondary | ICD-10-CM | POA: Diagnosis not present

## 2021-05-15 DIAGNOSIS — Z5112 Encounter for antineoplastic immunotherapy: Secondary | ICD-10-CM | POA: Diagnosis not present

## 2021-05-15 DIAGNOSIS — Z5111 Encounter for antineoplastic chemotherapy: Secondary | ICD-10-CM

## 2021-05-15 DIAGNOSIS — C7931 Secondary malignant neoplasm of brain: Secondary | ICD-10-CM

## 2021-05-15 DIAGNOSIS — C3411 Malignant neoplasm of upper lobe, right bronchus or lung: Secondary | ICD-10-CM | POA: Diagnosis not present

## 2021-05-15 LAB — CBC WITH DIFFERENTIAL (CANCER CENTER ONLY)
Abs Immature Granulocytes: 0.03 10*3/uL (ref 0.00–0.07)
Basophils Absolute: 0 10*3/uL (ref 0.0–0.1)
Basophils Relative: 1 %
Eosinophils Absolute: 0.2 10*3/uL (ref 0.0–0.5)
Eosinophils Relative: 3 %
HCT: 40.7 % (ref 39.0–52.0)
Hemoglobin: 13.5 g/dL (ref 13.0–17.0)
Immature Granulocytes: 1 %
Lymphocytes Relative: 26 %
Lymphs Abs: 1.6 10*3/uL (ref 0.7–4.0)
MCH: 29.4 pg (ref 26.0–34.0)
MCHC: 33.2 g/dL (ref 30.0–36.0)
MCV: 88.7 fL (ref 80.0–100.0)
Monocytes Absolute: 0.4 10*3/uL (ref 0.1–1.0)
Monocytes Relative: 7 %
Neutro Abs: 3.9 10*3/uL (ref 1.7–7.7)
Neutrophils Relative %: 62 %
Platelet Count: 180 10*3/uL (ref 150–400)
RBC: 4.59 MIL/uL (ref 4.22–5.81)
RDW: 13.6 % (ref 11.5–15.5)
WBC Count: 6.1 10*3/uL (ref 4.0–10.5)
nRBC: 0 % (ref 0.0–0.2)

## 2021-05-15 LAB — CMP (CANCER CENTER ONLY)
ALT: 19 U/L (ref 0–44)
AST: 20 U/L (ref 15–41)
Albumin: 3.7 g/dL (ref 3.5–5.0)
Alkaline Phosphatase: 65 U/L (ref 38–126)
Anion gap: 8 (ref 5–15)
BUN: 9 mg/dL (ref 8–23)
CO2: 28 mmol/L (ref 22–32)
Calcium: 9.3 mg/dL (ref 8.9–10.3)
Chloride: 105 mmol/L (ref 98–111)
Creatinine: 0.96 mg/dL (ref 0.61–1.24)
GFR, Estimated: >60 mL/min (ref >60)
Glucose, Bld: 95 mg/dL (ref 70–99)
Potassium: 4 mmol/L (ref 3.5–5.1)
Sodium: 141 mmol/L (ref 135–145)
Total Bilirubin: 0.4 mg/dL (ref 0.3–1.2)
Total Protein: 6.5 g/dL (ref 6.5–8.1)

## 2021-05-15 NOTE — Progress Notes (Signed)
[facility] [phone]   [phone]  OFFICE PROGRESS NOTE  [name], [place] [address]  DIAGNOSIS: Stage IV (T1b, N3, M1C) non-small cell lung cancer, favoring adenocarcinoma presented with right upper lobe lung nodule in addition to right hilar, subcarinal and bilateral mediastinal as well as supraclavicular lymphadenopathy in addition to bone and brain metastasis diagnosed in June 2022.     PD-L1 expression 80%.     Molecular Studies:  Biomarker Findings Microsatellite status - MS-Stable Tumor Mutational Burden - 6 Muts/Mb Genomic Findings For a complete list of the genes assayed, please refer to the Appendix. KRAS G12C, amplification ATM S470* CCND1 amplification - equivocal? HGF amplification - equivocal? MYC amplification - equivocal? FGF19 amplification - equivocal? FGF3 amplification - equivocal? FGF4 amplification - equivocal? NFKBIA amplification NKX2-1 amplification RAD21 amplification - equivocal? [id number]*26 TERT promoter -124C>T TP53 rearrangement exon 9 7 Disease relevant genes with no reportable alterations: ALK, BRAF, EGFR, ERBB2, MET, RET, ROS1   PRIOR THERAPY: SRS to the metastatic brain lesions under the care of Dr. MTammi Klippel  Last treatment on 05/04/2021.   CURRENT THERAPY: Palliative systemic chemotherapy with carboplatin for an AUC 5, Alimta 500 mg per metered squared, Keytruda 200 mg IV every 3 weeks.  First dose expected on 05/08/2021.  Status post 1 cycle.  INTERVAL HISTORY: TDywane PeruskiGoins 65y.o. male returns to the clinic today for follow-up visit accompanied by his wife.  The patient is feeling fine today with no concerning complaints except for fatigue.  He tolerated the first week of his chemotherapy fairly well.  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  He has an  appointment with endocrinology in few weeks for evaluation of this condition and management.  He denied having any current chest pain and his cough is better.  He denied having any shortness of breath except with exertion with no hemoptysis.  He has no nausea, vomiting, diarrhea or constipation.  He is here today for evaluation and repeat blood work.  MEDICAL HISTORY: Past Medical History:  Diagnosis Date   Cervical spondylolysis    Essential hypertension    GERD (gastroesophageal reflux disease)    History of kidney stones    History of migraine    Hyperlipidemia    PONV (postoperative nausea and vomiting)    Type 2 diabetes mellitus (HCC)     ALLERGIES:  has No Known Allergies.  MEDICATIONS:  Current Outpatient Medications  Medication Sig Dispense Refill   aspirin EC 81 MG tablet Take 81 mg by mouth every evening.     cholecalciferol (VITAMIN D) 1000 UNITS tablet Take 1,000 Units by mouth every evening.     folic acid (FOLVITE) 1 MG tablet Take 1 tablet (1 mg total) by mouth daily. 30 tablet 4   ibuprofen (ADVIL) 200 MG tablet Take 400 mg by mouth every 8 (eight) hours as needed (pain).     Krill Oil 300 MG CAPS Take 300 mg by mouth every evening.     loratadine (CLARITIN) 10 MG tablet Take 10 mg by mouth every evening.      losartan (COZAAR) 50 MG tablet TAKE 1 AND 1/2 TABLETS BY MOUTH DAILY 135 tablet 1   mirtazapine (REMERON) 15 MG tablet Take 1 tablet (15 mg total) by mouth at bedtime. 30 tablet 2   Multiple Vitamins-Minerals (MULTIVITAMIN GUMMIES ADULT PO) Take 2 each  by mouth daily.     pantoprazole (PROTONIX) 40 MG tablet Take 1 tablet (40 mg total) by mouth every evening. (NEEDS TO BE SEEN BEFORE NEXT REFILL) 30 tablet 0   prochlorperazine (COMPAZINE) 10 MG tablet Take 1 tablet (10 mg total) by mouth every 6 (six) hours as needed for nausea or vomiting. 30 tablet 0   rosuvastatin (CRESTOR) 20 MG tablet Take 1 tablet (20 mg total) by mouth daily. 90 tablet 1   venlafaxine XR  (EFFEXOR-XR) 75 MG 24 hr capsule Take 1 capsule (75 mg total) by mouth daily with breakfast. 90 capsule 1   No current facility-administered medications for this visit.    SURGICAL HISTORY:  Past Surgical History:  Procedure Laterality Date   Bilateral inguinal hernia repair     BRONCHIAL NEEDLE ASPIRATION BIOPSY  04/05/2021   Procedure: BRONCHIAL NEEDLE ASPIRATION BIOPSIES;  Surgeon: Garner Nash, DO;  Location: [facility];  Service: Pulmonary;;   COLONOSCOPY  01/23/2012   Procedure: COLONOSCOPY;  Surgeon: Daneil Dolin, MD;  [facility];  Service: Endoscopy;  Laterality: N/A;  9:30 AM   COLONOSCOPY N/A 10/11/2015   Procedure: COLONOSCOPY;  Surgeon: Daneil Dolin, MD;  [facility];  Service: Endoscopy;  Laterality: N/A;  830   COLONOSCOPY N/A 10/14/2019   Procedure: COLONOSCOPY;  Surgeon: Daneil Dolin, MD;  [facility];  Service: Endoscopy;  Laterality: N/A;  1:45   POLYPECTOMY  10/14/2019   Procedure: POLYPECTOMY;  Surgeon: Daneil Dolin, MD;  [facility];  Service: Endoscopy;;  ascending colon, descending colon   VIDEO BRONCHOSCOPY WITH ENDOBRONCHIAL ULTRASOUND N/A 04/05/2021   Procedure: VIDEO BRONCHOSCOPY WITH ENDOBRONCHIAL ULTRASOUND;  Surgeon: Garner Nash, DO;  Location: [facility];  Service: Pulmonary;  Laterality: N/A;    REVIEW OF SYSTEMS:  A comprehensive review of systems was negative except for: Constitutional: positive for fatigue   PHYSICAL EXAMINATION: General appearance: alert, cooperative, appears stated age, and no distress Head: Normocephalic, without obvious abnormality, atraumatic Neck: no adenopathy, no JVD, supple, symmetrical, trachea midline, and thyroid not enlarged, symmetric, no tenderness/mass/nodules Lymph nodes: Cervical, supraclavicular, and axillary nodes normal. Resp: clear to auscultation bilaterally Back: symmetric, no curvature. ROM normal. No CVA tenderness. Cardio: regular rate and  rhythm, S1, S2 normal, no murmur, click, rub or gallop GI: soft, non-tender; bowel sounds normal; no masses,  no organomegaly Extremities: extremities normal, atraumatic, no cyanosis or edema  ECOG PERFORMANCE STATUS: 1 - Symptomatic but completely ambulatory  Blood pressure 129/84, pulse 100, temperature (!) 97.5 F (36.4 C), temperature source Tympanic, resp. rate 18, height 6' (1.829 m), weight 158 lb 8 oz (71.9 kg), SpO2 99 %.  LABORATORY DATA: Lab Results  Component Value Date   WBC 6.1 05/15/2021   HGB 13.5 05/15/2021   HCT 40.7 05/15/2021   MCV 88.7 05/15/2021   PLT 180 05/15/2021      Chemistry      Component Value Date/Time   NA 142 05/08/2021 0807   NA 144 01/10/2021 0849   K 4.1 05/08/2021 0807   CL 109 05/08/2021 0807   CO2 23 05/08/2021 0807   BUN 9 05/08/2021 0807   BUN 15 01/10/2021 0849   CREATININE 0.99 05/08/2021 0807      Component Value Date/Time   CALCIUM 9.7 05/08/2021 0807   ALKPHOS 61 05/08/2021 0807   AST 19 05/08/2021 0807   ALT 17 05/08/2021 0807   BILITOT 0.3 05/08/2021 8563  RADIOGRAPHIC STUDIES: MR Brain W Wo Contrast  Result Date: 04/18/2021 CLINICAL DATA:  Brain/CNS neoplasm. EXAM: MRI HEAD WITHOUT AND WITH CONTRAST TECHNIQUE: Multiplanar, multiecho pulse sequences of the brain and surrounding structures were obtained without and with intravenous contrast. CONTRAST:  59m MULTIHANCE GADOBENATE DIMEGLUMINE 529 MG/ML IV SOLN COMPARISON:  June 20/21 MRI (postcontrast imaging performed on June 21). FINDINGS: Brain: Slight increase in size of a 7 mm peripherally enhancing metastatic lesion in the right parietal lobe (series 12, image 93), previously 5 mm. Interval increase in size of a 5 mm enhancing metastasis in the left cerebellum (series 12, image 49), previously punctate. Increased associated edema without significant mass effect. New punctate focus of enhancement in the high anterior right frontal lobe (series 12, image 127) and the  left parietal lobe (series 12, image 112; series 14, image 31) No substantial change in abnormal enhancement involving the pituitary infundibulum, hypothalamus, and posterior pituitary, which was further evaluated on prior postcontrast pituitary protocol imaging from June 21 22. No acute infarct, acute hemorrhage, hydrocephalus, midline shift, basal cistern effacement, or extra-axial fluid collection. Moderate scattered T2/FLAIR hyperintensities within the white matter, nonspecific but most likely related to chronic microvascular ischemic disease. Vascular: Major arterial flow voids are maintained at the skull base. Skull and upper cervical spine: Scattered small foci of enhancement in the calvarium (for example, series 12, images 93, 114 and 134 and the left posterior calvarium) with very faint FLAIR hyperintensity in these regions. Sinuses/Orbits: Mild ethmoid air cell mucosal thickening. Unremarkable orbits. Other: No mastoid effusions. IMPRESSION: 1. Increased 7 mm metastasis in the right parietal lobe metastasis (previously 5 mm) and 5 mm metastasis in the left cerebellum (previously punctate). Increased associated edema without significant mass effect. 2. New punctate focus of enhancement in the left parietal lobe (series 12, image 112), which is suspicious for new punctate metastasis. 3. New punctate focus of enhancement in the high anterior right frontal lobe (series 12, image 127), which could represent a punctate metastasis versus cortical vessel. 4. No substantial change in abnormal enhancement involving the pituitary infundibulum, hypothalamus, and posterior pituitary, which was further evaluated on prior postcontrast pituitary protocol imaging from June 21, 22. Please see that study for differential considerations. 5. Multiple small foci of enhancement in the calvarium, which are not specific but could represent early osseous metastases. Recommend attention on follow-up. Electronically Signed   By:  FMargaretha SheffieldMD   On: 04/18/2021 08:02    ASSESSMENT AND PLAN: This is a very pleasant 65years old white male recently diagnosed with stage IV (T1b, N3, M1 C) non-small cell lung cancer favoring adenocarcinoma presented with right upper lobe lung nodule in addition to right hilar, subcarinal and bilateral mediastinal as well as supraclavicular lymphadenopathy.  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  His PD-L1 expression is 80% and his molecular studies showed KRAS G12C mutation. The patient underwent SRS to metastatic brain lesion under the care of Dr. MTammi Klippeland he is currently undergoing systemic chemotherapy with carboplatin for AUC of 5, Alimta 500 Mg/M2 and Keytruda 200 Mg IV every 3 weeks status post 1 cycle.  He tolerated the first week of his treatment fairly well with no concerning adverse effect except for the mild fatigue which could be related to the chemo but also more likely secondary to his decreased testosterone level and hypopituitarism. I recommended for the patient to continue on his current treatment and he is expected to start cycle #2 in  2 weeks. I will see the patient back for follow-up visit in 2 weeks for evaluation before the next cycle of his treatment. The patient was advised to call immediately if he has any concerning symptoms in the interval. The patient voices understanding of current disease status and treatment options and is in agreement with the current care plan.  All questions were answered. The patient knows to call the clinic with any problems, questions or concerns. We can certainly see the patient much sooner if necessary.  The total time spent in the appointment was 20 minutes.  Disclaimer: This note was dictated with voice recognition software. Similar sounding words can inadvertently be transcribed and may not be corrected upon review.

## 2021-05-15 NOTE — Patient Instructions (Signed)
Steps to Quit Smoking Smoking tobacco is the leading cause of preventable death. It can affect almost every organ in the body. Smoking puts you and people around you at risk for many serious, long-lasting (chronic) diseases. Quitting smoking can be hard, but it is one of the best things that you can do for your health. It is never too late to quit. How do I get ready to quit? When you decide to quit smoking, make a plan to help you succeed. Before you quit: Pick a date to quit. Set a date within the next 2 weeks to give you time to prepare. Write down the reasons why you are quitting. Keep this list in places where you will see it often. Tell your family, friends, and co-workers that you are quitting. Their support is important. Talk with your doctor about the choices that may help you quit. Find out if your health insurance will pay for these treatments. Know the people, places, things, and activities that make you want to smoke (triggers). Avoid them. What first steps can I take to quit smoking? Throw away all cigarettes at home, at work, and in your car. Throw away the things that you use when you smoke, such as ashtrays and lighters. Clean your car. Make sure to empty the ashtray. Clean your home, including curtains and carpets. What can I do to help me quit smoking? Talk with your doctor about taking medicines and seeing a counselor at the same time. You are more likely to succeed when you do both. If you are pregnant or breastfeeding, talk with your doctor about counseling or other ways to quit smoking. Do not take medicine to help you quit smoking unless your doctor tells you to do so. To quit smoking: Quit right away Quit smoking totally, instead of slowly cutting back on how much you smoke over a period of time. Go to counseling. You are more likely to quit if you go to counseling sessions regularly. Take medicine You may take medicines to help you quit. Some medicines need a  prescription, and some you can buy over-the-counter. Some medicines may contain a drug called nicotine to replace the nicotine in cigarettes. Medicines may: Help you to stop having the desire to smoke (cravings). Help to stop the problems that come when you stop smoking (withdrawal symptoms). Your doctor may ask you to use: Nicotine patches, gum, or lozenges. Nicotine inhalers or sprays. Non-nicotine medicine that is taken by mouth. Find resources Find resources and other ways to help you quit smoking and remain smoke-free after you quit. These resources are most helpful when you use them often. They include: Online chats with a counselor. Phone quitlines. Printed self-help materials. Support groups or group counseling. Text messaging programs. Mobile phone apps. Use apps on your mobile phone or tablet that can help you stick to your quit plan. There are many free apps for mobile phones and tablets as well as websites. Examples include Quit Guide from the CDC and smokefree.gov  What things can I do to make it easier to quit?  Talk to your family and friends. Ask them to support and encourage you. Call a phone quitline (1-800-QUIT-NOW), reach out to support groups, or work with a counselor. Ask people who smoke to not smoke around you. Avoid places that make you want to smoke, such as: Bars. Parties. Smoke-break areas at work. Spend time with people who do not smoke. Lower the stress in your life. Stress can make you want to   smoke. Try these things to help your stress: Getting regular exercise. Doing deep-breathing exercises. Doing yoga. Meditating. Doing a body scan. To do this, close your eyes, focus on one area of your body at a time from head to toe. Notice which parts of your body are tense. Try to relax the muscles in those areas. How will I feel when I quit smoking? Day 1 to 3 weeks Within the first 24 hours, you may start to have some problems that come from quitting tobacco.  These problems are very bad 2-3 days after you quit, but they do not often last for more than 2-3 weeks. You may get these symptoms: Mood swings. Feeling restless, nervous, angry, or annoyed. Trouble concentrating. Dizziness. Strong desire for high-sugar foods and nicotine. Weight gain. Trouble pooping (constipation). Feeling like you may vomit (nausea). Coughing or a sore throat. Changes in how the medicines that you take for other issues work in your body. Depression. Trouble sleeping (insomnia). Week 3 and afterward After the first 2-3 weeks of quitting, you may start to notice more positive results, such as: Better sense of smell and taste. Less coughing and sore throat. Slower heart rate. Lower blood pressure. Clearer skin. Better breathing. Fewer sick days. Quitting smoking can be hard. Do not give up if you fail the first time. Some people need to try a few times before they succeed. Do your best to stick to your quit plan, and talk with your doctor if you have any questions or concerns. Summary Smoking tobacco is the leading cause of preventable death. Quitting smoking can be hard, but it is one of the best things that you can do for your health. When you decide to quit smoking, make a plan to help you succeed. Quit smoking right away, not slowly over a period of time. When you start quitting, seek help from your doctor, family, or friends. This information is not intended to replace advice given to you by your health care provider. Make sure you discuss any questions you have with your health care provider. Document Revised: 06/19/2019 Document Reviewed: 12/13/2018 Elsevier Patient Education  2022 [employer]  

## 2021-05-16 ENCOUNTER — Inpatient Hospital Stay: Payer: 59 | Admitting: Internal Medicine

## 2021-05-16 ENCOUNTER — Other Ambulatory Visit: Payer: 59

## 2021-05-16 VITALS — BP 131/89 | HR 112 | Temp 98.7°F | Resp 18 | Ht 72.0 in | Wt 159.2 lb

## 2021-05-16 DIAGNOSIS — Z5111 Encounter for antineoplastic chemotherapy: Secondary | ICD-10-CM | POA: Diagnosis not present

## 2021-05-16 DIAGNOSIS — C7931 Secondary malignant neoplasm of brain: Secondary | ICD-10-CM | POA: Diagnosis not present

## 2021-05-16 DIAGNOSIS — C7951 Secondary malignant neoplasm of bone: Secondary | ICD-10-CM | POA: Diagnosis not present

## 2021-05-16 DIAGNOSIS — C3411 Malignant neoplasm of upper lobe, right bronchus or lung: Secondary | ICD-10-CM | POA: Diagnosis not present

## 2021-05-16 DIAGNOSIS — Z5112 Encounter for antineoplastic immunotherapy: Secondary | ICD-10-CM | POA: Diagnosis not present

## 2021-05-16 MED ORDER — DEXAMETHASONE 4 MG PO TABS: 4.0000 mg | ORAL_TABLET | Freq: Every day | ORAL | 0 refills | Status: DC

## 2021-05-16 NOTE — Progress Notes (Signed)
[facility] [place], [place] [address] [phone]   New Patient Evaluation  Date of Service: 05/16/21 Patient Name: Bradley Goodman Patient MRN: [id number] Patient DOB: 1955/10/20 Provider: Ventura Sellers, MD  Identifying Statement:  Bradley Goodman is a 65 y.o. male with Brain metastasis [facility]) who presents for initial consultation and evaluation regarding cancer associated neurologic deficits.    Referring Provider: Tyler Pita, MD [address] [place],  [place] [address]  Primary Cancer:  Oncologic History: Oncology History  Adenocarcinoma of right lung, stage 4 ([place])  04/25/2021 Initial Diagnosis   Adenocarcinoma of right lung, stage 4 ([place])    04/25/2021 Cancer Staging   Staging form: Lung, AJCC 8th Edition - Clinical: Stage IVB (cT1b, cN3, cM1c) - Signed by Curt Bears, MD on 04/25/2021    05/08/2021 -  Chemotherapy    Patient is on Treatment Plan: LUNG CARBOPLATIN / PEMETREXED / PEMBROLIZUMAB Q21D INDUCTION X 4 CYCLES / MAINTENANCE PEMETREXED + PEMBROLIZUMAB        CNS Oncologic History 05/04/21: Radiosurgery to 4 brain metastases, 5 fractions to pituitary lesion  History of Present Illness: The patient's records from the referring physician were obtained and reviewed and the patient interviewed to confirm this HPI.  [name] presented to medical attention initially on 03/27/21 with new onset left eyelid drooping.  CNS imaging was obtained, which demonstrated multiple enhancing lesions consistent with brain metastases.  Systemic workup demonstrated lung cancer, which was confirmed through endobronchial biopsy.  He underwent single fraction radiosurgery to brain lesions, and fractionated radiosurgery to pituitary lesion.  Following radiation, eyelid drooping improved, but he developed "tunnel vision" visual deficits, described as "like looking through plastic".  This has been more or less static  over the past week or so, though it did improve transiently after dosing decadron with chemotherapy on 05/08/21.   Medications: Current Outpatient Medications on File Prior to Visit  Medication Sig Dispense Refill   aspirin EC 81 MG tablet Take 81 mg by mouth every evening.     cholecalciferol (VITAMIN D) 1000 UNITS tablet Take 1,000 Units by mouth every evening.     folic acid (FOLVITE) 1 MG tablet Take 1 tablet (1 mg total) by mouth daily. 30 tablet 4   ibuprofen (ADVIL) 200 MG tablet Take 400 mg by mouth every 8 (eight) hours as needed (pain).     Krill Oil 300 MG CAPS Take 300 mg by mouth every evening.     loratadine (CLARITIN) 10 MG tablet Take 10 mg by mouth every evening.      losartan (COZAAR) 50 MG tablet TAKE 1 AND 1/2 TABLETS BY MOUTH DAILY 135 tablet 1   mirtazapine (REMERON) 15 MG tablet Take 1 tablet (15 mg total) by mouth at bedtime. 30 tablet 2   Multiple Vitamins-Minerals (MULTIVITAMIN GUMMIES ADULT PO) Take 2 each by mouth daily.     pantoprazole (PROTONIX) 40 MG tablet Take 1 tablet (40 mg total) by mouth every evening. (NEEDS TO BE SEEN BEFORE NEXT REFILL) 30 tablet 0   prochlorperazine (COMPAZINE) 10 MG tablet Take 1 tablet (10 mg total) by mouth every 6 (six) hours as needed for nausea or vomiting. (Patient not taking: Reported on 05/15/2021) 30 tablet 0   rosuvastatin (CRESTOR) 20 MG tablet Take 1 tablet (20 mg total) by mouth daily. 90 tablet 1   venlafaxine XR (EFFEXOR-XR) 75 MG 24 hr capsule Take 1 capsule (75 mg total) by  mouth daily with breakfast. 90 capsule 1   No current facility-administered medications on file prior to visit.    Allergies: No Known Allergies Past Medical History:  Past Medical History:  Diagnosis Date   Cervical spondylolysis    Essential hypertension    GERD (gastroesophageal reflux disease)    History of kidney stones    History of migraine    Hyperlipidemia    PONV (postoperative nausea and vomiting)    Type 2 diabetes mellitus ([place])     Past Surgical History:  Past Surgical History:  Procedure Laterality Date   Bilateral inguinal hernia repair     BRONCHIAL NEEDLE ASPIRATION BIOPSY  04/05/2021   Procedure: BRONCHIAL NEEDLE ASPIRATION BIOPSIES;  Surgeon: Garner Nash, DO;  Location: [facility];  Service: Pulmonary;;   COLONOSCOPY  01/23/2012   Procedure: COLONOSCOPY;  Surgeon: Daneil Dolin, MD;  [facility];  Service: Endoscopy;  Laterality: N/A;  9:30 AM   COLONOSCOPY N/A 10/11/2015   Procedure: COLONOSCOPY;  Surgeon: Daneil Dolin, MD;  [facility];  Service: Endoscopy;  Laterality: N/A;  830   COLONOSCOPY N/A 10/14/2019   Procedure: COLONOSCOPY;  Surgeon: Daneil Dolin, MD;  [facility];  Service: Endoscopy;  Laterality: N/A;  1:45   POLYPECTOMY  10/14/2019   Procedure: POLYPECTOMY;  Surgeon: Daneil Dolin, MD;  [facility];  Service: Endoscopy;;  ascending colon, descending colon   VIDEO BRONCHOSCOPY WITH ENDOBRONCHIAL ULTRASOUND N/A 04/05/2021   Procedure: VIDEO BRONCHOSCOPY WITH ENDOBRONCHIAL ULTRASOUND;  Surgeon: Garner Nash, DO;  Location: [facility];  Service: Pulmonary;  Laterality: N/A;   Social History:  Social History   Socioeconomic History   Marital status: Married    Spouse name: Not on file   Number of children: Not on file   Years of education: Not on file   Highest education level: Not on file  Occupational History   Not on file  Tobacco Use   Smoking status: Every Day    Packs/day: 0.50    Years: 30.00    Pack years: 15.00    Types: Cigarettes    Start date: 10/30/1973   Smokeless tobacco: Never  Vaping Use   Vaping Use: Never used  Substance and Sexual Activity   Alcohol use: Yes    Alcohol/week: 0.0 standard drinks    Comment: One drink every 6 months.   Drug use: No   Sexual activity: Yes  Other Topics Concern   Not on file  Social History Narrative   Not on file   Social Determinants of Health   Financial Resource  Strain: Not on file  Food Insecurity: Not on file  Transportation Needs: Not on file  Physical Activity: Not on file  Stress: Not on file  Social Connections: Not on file  Intimate Partner Violence: Not on file   Family History:  Family History  Problem Relation Age of Onset   Heart attack Mother    Heart attack Father    Heart attack Brother    Colon cancer Neg Hx     Review of Systems: Constitutional: Doesn't report fevers, chills or abnormal weight loss Eyes: Doesn't report blurriness of vision Ears, nose, mouth, throat, and face: Doesn't report sore throat Respiratory: Doesn't report cough, dyspnea or wheezes Cardiovascular: Doesn't report palpitation, chest discomfort  Gastrointestinal:  Doesn't report nausea, constipation, diarrhea GU: Doesn't report incontinence Skin: Doesn't report skin rashes Neurological: Per HPI Musculoskeletal: Doesn't report joint pain Behavioral/Psych:  Doesn't report anxiety  Physical Exam: Vitals:   05/16/21 1512  BP: 131/89  Pulse: (!) 112  Resp: 18  Temp: 98.7 F (37.1 C)  SpO2: 100%   KPS: 90. General: Alert, cooperative, pleasant, in no acute distress Head: Normal EENT: No conjunctival injection or scleral icterus.  Lungs: Resp effort normal Cardiac: Regular rate Abdomen: Non-distended abdomen Skin: No rashes cyanosis or petechiae. Extremities: No clubbing or edema  Neurologic Exam: Mental Status: Awake, alert, attentive to examiner. Oriented to self and environment. Language is fluent with intact comprehension.  Cranial Nerves: Visual acuity is grossly normal. Visual fields are full. Extra-ocular movements intact. No ptosis. Face is symmetric Motor: Tone and bulk are normal. Power is full in both arms and legs. Reflexes are symmetric, no pathologic reflexes present.  Sensory: Intact to light touch Gait: Normal.   Labs: I have reviewed the data as listed    Component Value Date/Time   NA 141 05/15/2021 1011   NA 144  01/10/2021 0849   K 4.0 05/15/2021 1011   CL 105 05/15/2021 1011   CO2 28 05/15/2021 1011   GLUCOSE 95 05/15/2021 1011   BUN 9 05/15/2021 1011   BUN 15 01/10/2021 0849   CREATININE 0.96 05/15/2021 1011   CALCIUM 9.3 05/15/2021 1011   PROT 6.5 05/15/2021 1011   PROT 6.3 01/10/2021 0849   ALBUMIN 3.7 05/15/2021 1011   ALBUMIN 4.2 01/10/2021 0849   AST 20 05/15/2021 1011   ALT 19 05/15/2021 1011   ALKPHOS 65 05/15/2021 1011   BILITOT 0.4 05/15/2021 1011   GFRNONAA >60 05/15/2021 1011   GFRAA 82 07/14/2020 0945   Lab Results  Component Value Date   WBC 6.1 05/15/2021   NEUTROABS 3.9 05/15/2021   HGB 13.5 05/15/2021   HCT 40.7 05/15/2021   MCV 88.7 05/15/2021   PLT 180 05/15/2021    Imaging:  MR Brain W Wo Contrast  Result Date: 04/18/2021 CLINICAL DATA:  Brain/CNS neoplasm. EXAM: MRI HEAD WITHOUT AND WITH CONTRAST TECHNIQUE: Multiplanar, multiecho pulse sequences of the brain and surrounding structures were obtained without and with intravenous contrast. CONTRAST:  46mL MULTIHANCE GADOBENATE DIMEGLUMINE 529 MG/ML IV SOLN COMPARISON:  June 20/21 MRI (postcontrast imaging performed on June 21). FINDINGS: Brain: Slight increase in size of a 7 mm peripherally enhancing metastatic lesion in the right parietal lobe (series 12, image 93), previously 5 mm. Interval increase in size of a 5 mm enhancing metastasis in the left cerebellum (series 12, image 49), previously punctate. Increased associated edema without significant mass effect. New punctate focus of enhancement in the high anterior right frontal lobe (series 12, image 127) and the left parietal lobe (series 12, image 112; series 14, image 31) No substantial change in abnormal enhancement involving the pituitary infundibulum, hypothalamus, and posterior pituitary, which was further evaluated on prior postcontrast pituitary protocol imaging from June 21 22. No acute infarct, acute hemorrhage, hydrocephalus, midline shift, basal cistern  effacement, or extra-axial fluid collection. Moderate scattered T2/FLAIR hyperintensities within the white matter, nonspecific but most likely related to chronic microvascular ischemic disease. Vascular: Major arterial flow voids are maintained at the skull base. Skull and upper cervical spine: Scattered small foci of enhancement in the calvarium (for example, series 12, images 93, 114 and 134 and the left posterior calvarium) with very faint FLAIR hyperintensity in these regions. Sinuses/Orbits: Mild ethmoid air cell mucosal thickening. Unremarkable orbits. Other: No mastoid effusions. IMPRESSION: 1. Increased 7 mm metastasis in the right parietal lobe metastasis (previously 5  mm) and 5 mm metastasis in the left cerebellum (previously punctate). Increased associated edema without significant mass effect. 2. New punctate focus of enhancement in the left parietal lobe (series 12, image 112), which is suspicious for new punctate metastasis. 3. New punctate focus of enhancement in the high anterior right frontal lobe (series 12, image 127), which could represent a punctate metastasis versus cortical vessel. 4. No substantial change in abnormal enhancement involving the pituitary infundibulum, hypothalamus, and posterior pituitary, which was further evaluated on prior postcontrast pituitary protocol imaging from June 21, 22. Please see that study for differential considerations. 5. Multiple small foci of enhancement in the calvarium, which are not specific but could represent early osseous metastases. Recommend attention on follow-up. Electronically Signed   By: Margaretha Sheffield MD   On: 04/18/2021 08:02     Assessment/Plan Brain metastasis ([place])  Ernesta Amble [place] presents with clinical changes today localizing to the optic chiasm.  Likely etiology is acute/subacute inflammation surrounding pituitary secondary to radiosurgery.  No other focal or progressive neurologic deficits.  We recommended trial of  dexamethasone 4mg  daily x7 days.  We will call him in 1 week to characterize response and assess need for further titration.    For brain metastases, will follow up in person in 3 months following 3 months post-SRS MRI scan.    Testosterone deficiency and other endocrine labs will be followed and addressed by Dr. Dorris Fetch.    He will con't to undergo chemotherapy with Dr. Julien Nordmann in the interim Norma Fredrickson, [place], [place]).    We spent twenty additional minutes teaching regarding the natural history, biology, and historical experience in the treatment of neurologic complications of cancer.   We appreciate the opportunity to participate in the care of GARDY MONTANARI.   All questions were answered. The patient knows to call the clinic with any problems, questions or concerns. No barriers to learning were detected.  The total time spent in the encounter was 40 minutes and more than 50% was on counseling and review of test results   Ventura Sellers, MD Medical Director of Neuro-Oncology [facility] 05/16/21 4:26 PM

## 2021-05-23 ENCOUNTER — Other Ambulatory Visit: Payer: Self-pay

## 2021-05-23 ENCOUNTER — Inpatient Hospital Stay: Payer: 59

## 2021-05-23 ENCOUNTER — Inpatient Hospital Stay (HOSPITAL_BASED_OUTPATIENT_CLINIC_OR_DEPARTMENT_OTHER): Payer: 59 | Admitting: Internal Medicine

## 2021-05-23 DIAGNOSIS — C7931 Secondary malignant neoplasm of brain: Secondary | ICD-10-CM

## 2021-05-23 DIAGNOSIS — Z5112 Encounter for antineoplastic immunotherapy: Secondary | ICD-10-CM | POA: Diagnosis not present

## 2021-05-23 DIAGNOSIS — C7951 Secondary malignant neoplasm of bone: Secondary | ICD-10-CM | POA: Diagnosis not present

## 2021-05-23 DIAGNOSIS — C3491 Malignant neoplasm of unspecified part of right bronchus or lung: Secondary | ICD-10-CM

## 2021-05-23 DIAGNOSIS — C3411 Malignant neoplasm of upper lobe, right bronchus or lung: Secondary | ICD-10-CM | POA: Diagnosis not present

## 2021-05-23 DIAGNOSIS — Z5111 Encounter for antineoplastic chemotherapy: Secondary | ICD-10-CM | POA: Diagnosis not present

## 2021-05-23 LAB — CBC WITH DIFFERENTIAL (CANCER CENTER ONLY)
Abs Immature Granulocytes: 0.1 K/uL — ABNORMAL HIGH (ref 0.00–0.07)
Basophils Absolute: 0 K/uL (ref 0.0–0.1)
Basophils Relative: 0 %
Eosinophils Absolute: 0 K/uL (ref 0.0–0.5)
Eosinophils Relative: 0 %
HCT: 37.1 % — ABNORMAL LOW (ref 39.0–52.0)
Hemoglobin: 12.6 g/dL — ABNORMAL LOW (ref 13.0–17.0)
Immature Granulocytes: 1 %
Lymphocytes Relative: 12 %
Lymphs Abs: 1.2 K/uL (ref 0.7–4.0)
MCH: 30.1 pg (ref 26.0–34.0)
MCHC: 34 g/dL (ref 30.0–36.0)
MCV: 88.5 fL (ref 80.0–100.0)
Monocytes Absolute: 0.4 K/uL (ref 0.1–1.0)
Monocytes Relative: 4 %
Neutro Abs: 8.6 K/uL — ABNORMAL HIGH (ref 1.7–7.7)
Neutrophils Relative %: 83 %
Platelet Count: 162 K/uL (ref 150–400)
RBC: 4.19 MIL/uL — ABNORMAL LOW (ref 4.22–5.81)
RDW: 15.3 % (ref 11.5–15.5)
WBC Count: 10.3 K/uL (ref 4.0–10.5)
nRBC: 0 % (ref 0.0–0.2)

## 2021-05-23 LAB — CMP (CANCER CENTER ONLY)
ALT: 81 U/L — ABNORMAL HIGH (ref 0–44)
AST: 37 U/L (ref 15–41)
Albumin: 3.7 g/dL (ref 3.5–5.0)
Alkaline Phosphatase: 73 U/L (ref 38–126)
Anion gap: 11 (ref 5–15)
BUN: 13 mg/dL (ref 8–23)
CO2: 24 mmol/L (ref 22–32)
Calcium: 9.2 mg/dL (ref 8.9–10.3)
Chloride: 110 mmol/L (ref 98–111)
Creatinine: 1.02 mg/dL (ref 0.61–1.24)
GFR, Estimated: >60 mL/min
Glucose, Bld: 200 mg/dL — ABNORMAL HIGH (ref 70–99)
Potassium: 3.8 mmol/L (ref 3.5–5.1)
Sodium: 145 mmol/L (ref 135–145)
Total Bilirubin: <0.2 mg/dL — ABNORMAL LOW (ref 0.3–1.2)
Total Protein: 6.4 g/dL — ABNORMAL LOW (ref 6.5–8.1)

## 2021-05-23 LAB — TSH: TSH: 0.4 u[IU]/mL (ref 0.320–4.118)

## 2021-05-23 MED ORDER — DEXAMETHASONE 1 MG PO TABS
2.0000 mg | ORAL_TABLET | Freq: Every day | ORAL | 1 refills | Status: DC
Start: 2021-05-23 — End: 2021-06-27

## 2021-05-23 NOTE — Progress Notes (Signed)
I connected with Bradley Goodman on 05/23/21 at  2:00 PM EDT by telephone visit and verified that I am speaking with the correct person using two identifiers.  I discussed the limitations, risks, security and privacy concerns of performing an evaluation and management service by telemedicine and the availability of in-person appointments. I also discussed with the patient that there may be a patient responsible charge related to this service. The patient expressed understanding and agreed to proceed.  Other persons participating in the visit and their role in the encounter:  n/a  Patient's location:  Home  Provider's location:  Office  Chief Complaint:  Brain metastasis (H[facility])`  History of Present Ilness: Bradley Goodman describes resolution of "tunnel vision" impairment described at prior visit, following administration of decadron 4mg  daily.  It took several days to respond to the intervention, but he feels overall significant improvement. No notable side effects Observations: Language and cognition at baseline Assessment and Plan: Brain metastasis ([place])  Clinically improved, recommend reducing decadron to 2mg  daily x1 week, then 1mg  daily x1 week, then stopping. Follow Up Instructions: RTC after MRI in 2-3 months or sooner if needed  I discussed the assessment and treatment plan with the patient.  The patient was provided an opportunity to ask questions and all were answered.  The patient agreed with the plan and demonstrated understanding of the instructions.    The patient was advised to call back or seek an in-person evaluation if the symptoms worsen or if the condition fails to improve as anticipated.  I provided 5-10 minutes of non-face-to-face time during this enocunter.  Ventura Sellers, MD   I provided 15 minutes of non face-to-face telephone visit time during this encounter, and > 50% was spent counseling as documented under my assessment & plan.

## 2021-05-25 ENCOUNTER — Ambulatory Visit: Payer: Self-pay | Admitting: Urology

## 2021-05-25 NOTE — Progress Notes (Signed)
[facility] OFFICE PROGRESS NOTE  Bradley Goodman, [place] [address]  DIAGNOSIS: Stage IV (T1b, N3, M1C) non-small cell lung cancer, favoring adenocarcinoma presented with right upper lobe lung nodule in addition to right hilar, subcarinal and bilateral mediastinal as well as supraclavicular lymphadenopathy in addition to bone and brain metastasis diagnosed in June 2022.   PD-L1 expression 80%.     Molecular Studies:  Biomarker Findings Microsatellite status - MS-Stable Tumor Mutational Burden - 6 Muts/Mb Genomic Findings For a complete list of the genes assayed, please refer to the Appendix. KRAS G12C, amplification ATM S470* CCND1 amplification - equivocal? HGF amplification - equivocal? MYC amplification - equivocal? FGF19 amplification - equivocal? FGF3 amplification - equivocal? FGF4 amplification - equivocal? NFKBIA amplification NKX2-1 amplification RAD21 amplification - equivocal? [id number]*26 TERT promoter -124C>T TP53 rearrangement exon 9 7 Disease relevant genes with no reportable alterations: ALK, BRAF, EGFR, ERBB2, MET, RET, ROS1  PRIOR THERAPY: SRS to the metastatic brain lesions under the care of Dr. MTammi Goodman  Last treatment on 05/04/2021.  CURRENT THERAPY:  Palliative  systemic chemotherapy with carboplatin for an AUC 5, Alimta 500 mg per metered squared, Keytruda 200 mg IV every 3 weeks.  First dose on 05/08/2021.  INTERVAL HISTORY: Bradley Goodman 65y.o. male returns to the clinic today for a follow up visit. The patient is feeling fairly well today without any concerning complaints except for a sore tongue and some constipation. His tongue soreness is improving compared to yesterday. No thrush or ulcerations. He also has baseline low frequency of bowel movements. However, he has had some hard bowel movements and wants to make sure it is ok to take OTC products with his chemotherapy. He tolerated his first cycle of  treatment fairly well except for fatigue which is likely multifactorial and nausea. He is seeing an endocrinologist on 06/06/21 due to the pituitary lesion. He also is following with neuro-oncology with Dr. VMickeal Goodman He is presently on steriods with decadron. He is taking 2 mg daily presently and will start one 1 mg daily on 06/01/21. His tunnel vision is "almost 100% better".   Otherwise, The patient denies any recent fever. His appetite has improved, possibly from the steroids, and he has gained weight. He is currently taking Remeron for decreased appetite and insomnia as well.  He denies unusual cough. He denies any chest pain, shortness of breath, or hemoptysis. He is working on smoking cessation and has cut back to 4-5 cigarettes a day. He denies any recent nausea, vomiting, or diarrhea. He denies headache. He is trying to increase his protein intake due to his protein being low on his lab work. The patient is here today for evaluation and repeat blood work before undergoing cycle #2 of treatment.   MEDICAL HISTORY: Past Medical History:  Diagnosis Date   Cervical spondylolysis    Essential hypertension    GERD (gastroesophageal reflux disease)    History of kidney stones    History of migraine    Hyperlipidemia    PONV (postoperative nausea and vomiting)    Type 2 diabetes mellitus (HCC)     ALLERGIES:  has No Known Allergies.  MEDICATIONS:  Current Outpatient Medications  Medication Sig Dispense Refill   cholecalciferol (VITAMIN D) 1000 UNITS tablet Take 1,000 Units by mouth every evening.     dexamethasone (DECADRON) 1 MG tablet Take 2 tablets (2 mg total) by mouth daily. 60 tablet 1   folic acid (FOLVITE) 1 MG  tablet Take 1 tablet (1 mg total) by mouth daily. 30 tablet 4   Krill Oil 300 MG CAPS Take 300 mg by mouth every evening.     loratadine (CLARITIN) 10 MG tablet Take 10 mg by mouth every evening.      losartan (COZAAR) 50 MG tablet TAKE 1 AND 1/2 TABLETS BY MOUTH DAILY 135  tablet 1   mirtazapine (REMERON) 15 MG tablet Take 1 tablet (15 mg total) by mouth at bedtime. 30 tablet 2   Multiple Vitamins-Minerals (MULTIVITAMIN GUMMIES ADULT PO) Take 2 each by mouth daily.     pantoprazole (PROTONIX) 40 MG tablet Take 1 tablet (40 mg total) by mouth every evening. (NEEDS TO BE SEEN BEFORE NEXT REFILL) 30 tablet 0   prochlorperazine (COMPAZINE) 10 MG tablet Take 1 tablet (10 mg total) by mouth every 6 (six) hours as needed for nausea or vomiting. (Patient not taking: Reported on 05/15/2021) 30 tablet 0   rosuvastatin (CRESTOR) 20 MG tablet Take 1 tablet (20 mg total) by mouth daily. 90 tablet 1   venlafaxine XR (EFFEXOR-XR) 75 MG 24 hr capsule Take 1 capsule (75 mg total) by mouth daily with breakfast. 90 capsule 1   No current facility-administered medications for this visit.    SURGICAL HISTORY:  Past Surgical History:  Procedure Laterality Date   Bilateral inguinal hernia repair     BRONCHIAL NEEDLE ASPIRATION BIOPSY  04/05/2021   Procedure: BRONCHIAL NEEDLE ASPIRATION BIOPSIES;  Surgeon: Garner Nash, DO;  Location: [facility];  Service: Pulmonary;;   COLONOSCOPY  01/23/2012   Procedure: COLONOSCOPY;  Surgeon: Daneil Dolin, MD;  [facility];  Service: Endoscopy;  Laterality: N/A;  9:30 AM   COLONOSCOPY N/A 10/11/2015   Procedure: COLONOSCOPY;  Surgeon: Daneil Dolin, MD;  [facility];  Service: Endoscopy;  Laterality: N/A;  830   COLONOSCOPY N/A 10/14/2019   Procedure: COLONOSCOPY;  Surgeon: Daneil Dolin, MD;  [facility];  Service: Endoscopy;  Laterality: N/A;  1:45   POLYPECTOMY  10/14/2019   Procedure: POLYPECTOMY;  Surgeon: Daneil Dolin, MD;  [facility];  Service: Endoscopy;;  ascending colon, descending colon   VIDEO BRONCHOSCOPY WITH ENDOBRONCHIAL ULTRASOUND N/A 04/05/2021   Procedure: VIDEO BRONCHOSCOPY WITH ENDOBRONCHIAL ULTRASOUND;  Surgeon: Garner Nash, DO;  Location: [facility];  Service:  Pulmonary;  Laterality: N/A;    REVIEW OF SYSTEMS:   Review of Systems  Constitutional: Negative for appetite change, chills, fatigue, fever and unexpected weight change.  HENT: Positive for sore tongue. Negative for thrush, ulcerations, nosebleeds, sore throat and trouble swallowing.   Eyes: Negative for eye problems and icterus.  Respiratory: Negative for cough, hemoptysis, shortness of breath and wheezing.   Cardiovascular: Negative for chest pain and leg swelling.  Gastrointestinal: Positive for constipation. Negative for abdominal pain, diarrhea, nausea and vomiting.  Genitourinary: Negative for bladder incontinence, difficulty urinating, dysuria, frequency and hematuria.   Musculoskeletal: Negative for back pain, gait problem, neck pain and neck stiffness.  Skin: Negative for itching and rash.  Neurological: Negative for dizziness, extremity weakness, gait problem, headaches, light-headedness and seizures.  Hematological: Negative for adenopathy. Does not bruise/bleed easily.  Psychiatric/Behavioral: Negative for confusion, depression and sleep disturbance. The patient is not nervous/anxious.     PHYSICAL EXAMINATION:  Blood pressure 118/77, pulse (!) 108, temperature 97.9 F (36.6 C), temperature source Tympanic, resp. rate 18, height 6' (1.829 Bradley), weight 162 lb 11.2 oz (73.8 kg), SpO2 99 %.  ECOG PERFORMANCE STATUS: 1  Physical Exam  Constitutional: Oriented to person, place, and time and thin appearing male and in no distress.  HENT:  Head: Normocephalic and atraumatic.  Mouth/Throat: Oropharynx is clear and moist. No oropharyngeal exudate.  Eyes: Conjunctivae are normal. Right eye exhibits no discharge. Left eye exhibits no discharge. No scleral icterus.  Neck: Normal range of motion. Neck supple.  Cardiovascular: Normal rate, regular rhythm, normal heart sounds and intact distal pulses.   Pulmonary/Chest: Effort normal and breath sounds normal. No respiratory distress. No  wheezes. No rales.  Abdominal: Soft. Bowel sounds are normal. Exhibits no distension and no mass. There is no tenderness.  Musculoskeletal: Normal range of motion. Exhibits no edema.  Lymphadenopathy:    No cervical adenopathy.  Neurological: Alert and oriented to person, place, and time. Exhibits normal muscle tone. Gait normal. Coordination normal.  Skin: Skin is warm and dry. No rash noted. Not diaphoretic. No erythema. No pallor.  Psychiatric: Mood, memory and judgment normal.  Vitals reviewed.  LABORATORY DATA: Lab Results  Component Value Date   WBC 11.7 (H) 05/29/2021   HGB 13.5 05/29/2021   HCT 41.1 05/29/2021   MCV 90.7 05/29/2021   PLT 182 05/29/2021      Chemistry      Component Value Date/Time   NA 144 05/29/2021 0730   NA 144 01/10/2021 0849   K 3.5 05/29/2021 0730   CL 107 05/29/2021 0730   CO2 23 05/29/2021 0730   BUN 23 05/29/2021 0730   BUN 15 01/10/2021 0849   CREATININE 0.87 05/29/2021 0730      Component Value Date/Time   CALCIUM 9.6 05/29/2021 0730   ALKPHOS 67 05/29/2021 0730   AST 21 05/29/2021 0730   ALT 53 (H) 05/29/2021 0730   BILITOT 0.2 (L) 05/29/2021 0730       RADIOGRAPHIC STUDIES:  No results found.   ASSESSMENT/PLAN:  This is a very pleasant 65 year old Caucasian male diagnosed with stage IV (T1b, N3, M1 C) non-small cell lung cancer, favoring adenocarcinoma.  The patient presented with a right upper lobe lung nodule in addition to right hilar, subcarinal, and bilateral mediastinal lymphadenopathy as well as supraclavicular lymphadenopathy.  The patient also has metastatic disease to the brain.  He was diagnosed in June 2022.  His PD-L1 expression is 80%.  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  The patient completed SRS to the brain lesions under the care of Dr. Tammi Goodman.  His last treatment was on 05/04/2021. 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   The patient is currently undergoing systemic chemotherapy with carboplatin for an AUC of 5, Alimta 500 mg per metered squared, Keytruda 200 mg IV every 3 weeks.  Status post 1 cycle.    Labs were reviewed. Recommend he proceed with cycle #2 today as scheduled.   We will see him back for a follow up visi tin 3 weeks for evaluation before starting cycle #3.   He will continue to take remeron for his decreased appetite and insomnia.   He will follow with neuro-oncology, radiation oncology, and endocrinology for his brain metastases.   Advised to use salt water rinses for his tongue soreness. Discussed if this did not help, that we can try to use magic mouthwash in the future.   Reviewed constipation education. Advised to use a stool softener such as  colace. If it is 2 or more days since having a bowel movement more than his normal bowel habits, then he was advised to use a laxative.   The patient was advised to call immediately if he has any concerning symptoms in the interval. The patient voices understanding of current disease status and treatment options and is in agreement with the current care plan. All questions were answered. The patient knows to call the clinic with any problems, questions or concerns. We can certainly see the patient much sooner if necessary    No orders of the defined types were placed in this encounter.    The total time spent in the appointment was 20-29 minutes.  Dorothye Berni L Falyn Rubel, PA-C 05/29/21

## 2021-05-29 ENCOUNTER — Inpatient Hospital Stay: Payer: 59 | Admitting: Physician Assistant

## 2021-05-29 ENCOUNTER — Other Ambulatory Visit: Payer: Self-pay

## 2021-05-29 ENCOUNTER — Other Ambulatory Visit: Payer: 59

## 2021-05-29 ENCOUNTER — Ambulatory Visit: Payer: 59

## 2021-05-29 ENCOUNTER — Inpatient Hospital Stay: Payer: 59

## 2021-05-29 ENCOUNTER — Encounter: Payer: Self-pay | Admitting: Physician Assistant

## 2021-05-29 ENCOUNTER — Ambulatory Visit: Payer: 59 | Admitting: Internal Medicine

## 2021-05-29 VITALS — BP 118/77 | HR 108 | Temp 97.9°F | Resp 18 | Ht 72.0 in | Wt 162.7 lb

## 2021-05-29 VITALS — HR 99

## 2021-05-29 DIAGNOSIS — Z5112 Encounter for antineoplastic immunotherapy: Secondary | ICD-10-CM | POA: Diagnosis not present

## 2021-05-29 DIAGNOSIS — C7931 Secondary malignant neoplasm of brain: Secondary | ICD-10-CM

## 2021-05-29 DIAGNOSIS — C7951 Secondary malignant neoplasm of bone: Secondary | ICD-10-CM

## 2021-05-29 DIAGNOSIS — C3491 Malignant neoplasm of unspecified part of right bronchus or lung: Secondary | ICD-10-CM

## 2021-05-29 DIAGNOSIS — C3411 Malignant neoplasm of upper lobe, right bronchus or lung: Secondary | ICD-10-CM | POA: Diagnosis not present

## 2021-05-29 DIAGNOSIS — Z5111 Encounter for antineoplastic chemotherapy: Secondary | ICD-10-CM | POA: Diagnosis not present

## 2021-05-29 LAB — CBC WITH DIFFERENTIAL (CANCER CENTER ONLY)
Abs Immature Granulocytes: 0.29 10*3/uL — ABNORMAL HIGH (ref 0.00–0.07)
Basophils Absolute: 0.1 10*3/uL (ref 0.0–0.1)
Basophils Relative: 0 %
Eosinophils Absolute: 0.1 10*3/uL (ref 0.0–0.5)
Eosinophils Relative: 0 %
HCT: 41.1 % (ref 39.0–52.0)
Hemoglobin: 13.5 g/dL (ref 13.0–17.0)
Immature Granulocytes: 3 %
Lymphocytes Relative: 27 %
Lymphs Abs: 3.1 10*3/uL (ref 0.7–4.0)
MCH: 29.8 pg (ref 26.0–34.0)
MCHC: 32.8 g/dL (ref 30.0–36.0)
MCV: 90.7 fL (ref 80.0–100.0)
Monocytes Absolute: 0.7 10*3/uL (ref 0.1–1.0)
Monocytes Relative: 6 %
Neutro Abs: 7.4 10*3/uL (ref 1.7–7.7)
Neutrophils Relative %: 64 %
Platelet Count: 182 10*3/uL (ref 150–400)
RBC: 4.53 MIL/uL (ref 4.22–5.81)
RDW: 16.2 % — ABNORMAL HIGH (ref 11.5–15.5)
WBC Count: 11.7 10*3/uL — ABNORMAL HIGH (ref 4.0–10.5)
nRBC: 0 % (ref 0.0–0.2)

## 2021-05-29 LAB — CMP (CANCER CENTER ONLY)
ALT: 53 U/L — ABNORMAL HIGH (ref 0–44)
AST: 21 U/L (ref 15–41)
Albumin: 3.6 g/dL (ref 3.5–5.0)
Alkaline Phosphatase: 67 U/L (ref 38–126)
Anion gap: 14 (ref 5–15)
BUN: 23 mg/dL (ref 8–23)
CO2: 23 mmol/L (ref 22–32)
Calcium: 9.6 mg/dL (ref 8.9–10.3)
Chloride: 107 mmol/L (ref 98–111)
Creatinine: 0.87 mg/dL (ref 0.61–1.24)
GFR, Estimated: >60 mL/min (ref >60)
Glucose, Bld: 169 mg/dL — ABNORMAL HIGH (ref 70–99)
Potassium: 3.5 mmol/L (ref 3.5–5.1)
Sodium: 144 mmol/L (ref 135–145)
Total Bilirubin: 0.2 mg/dL — ABNORMAL LOW (ref 0.3–1.2)
Total Protein: 6.5 g/dL (ref 6.5–8.1)

## 2021-05-29 MED ORDER — SODIUM CHLORIDE 0.9 % IV SOLN
10.0000 mg | Freq: Once | INTRAVENOUS | Status: AC
  Administered 2021-05-29: 10 mg via INTRAVENOUS
  Filled 2021-05-29: qty 10

## 2021-05-29 MED ORDER — PALONOSETRON HCL INJECTION 0.25 MG/5ML
0.2500 mg | Freq: Once | INTRAVENOUS | Status: AC
  Administered 2021-05-29: 0.25 mg via INTRAVENOUS
  Filled 2021-05-29: qty 5

## 2021-05-29 MED ORDER — SODIUM CHLORIDE 0.9% FLUSH: 10.0000 mL | INTRAVENOUS | Status: DC | PRN

## 2021-05-29 MED ORDER — SODIUM CHLORIDE 0.9 % IV SOLN
500.0000 mg/m2 | Freq: Once | INTRAVENOUS | Status: AC
  Administered 2021-05-29: 1000 mg via INTRAVENOUS
  Filled 2021-05-29: qty 40

## 2021-05-29 MED ORDER — SODIUM CHLORIDE 0.9 % IV SOLN: Freq: Once | INTRAVENOUS | Status: AC

## 2021-05-29 MED ORDER — HEPARIN SOD (PORK) LOCK FLUSH 100 UNIT/ML IV SOLN: 500.0000 [IU] | Freq: Once | INTRAVENOUS | Status: DC | PRN

## 2021-05-29 MED ORDER — SODIUM CHLORIDE 0.9 % IV SOLN
510.0000 mg | Freq: Once | INTRAVENOUS | Status: AC
  Administered 2021-05-29: 510 mg via INTRAVENOUS
  Filled 2021-05-29: qty 51

## 2021-05-29 MED ORDER — SODIUM CHLORIDE 0.9 % IV SOLN
200.0000 mg | Freq: Once | INTRAVENOUS | Status: AC
  Administered 2021-05-29: 200 mg via INTRAVENOUS
  Filled 2021-05-29: qty 8

## 2021-05-29 MED ORDER — SODIUM CHLORIDE 0.9 % IV SOLN
150.0000 mg | Freq: Once | INTRAVENOUS | Status: AC
  Administered 2021-05-29: 150 mg via INTRAVENOUS
  Filled 2021-05-29: qty 150

## 2021-05-29 NOTE — Patient Instructions (Signed)
[facility] ONCOLOGY  Discharge Instructions: Thank you for choosing [facility] to provide your oncology and hematology care.   If you have a lab appointment with the [facility], please go directly to the [facility] and check in at the registration area.   Wear comfortable clothing and clothing appropriate for easy access to any Portacath or PICC line.   We strive to give you quality time with your provider. You may need to reschedule your appointment if you arrive late (15 or more minutes).  Arriving late affects you and other patients whose appointments are after yours.  Also, if you miss three or more appointments without notifying the office, you may be dismissed from the clinic at the provider's discretion.      For prescription refill requests, have your pharmacy contact our office and allow 72 hours for refills to be completed.    Today you received the following chemotherapy and/or immunotherapy agents: Keytruda, Alimta, Carboplatin   To help prevent nausea and vomiting after your treatment, we encourage you to take your nausea medication as directed.  BELOW ARE SYMPTOMS THAT SHOULD BE REPORTED IMMEDIATELY: *FEVER GREATER THAN 100.4 F (38 C) OR HIGHER *CHILLS OR SWEATING *NAUSEA AND VOMITING THAT IS NOT CONTROLLED WITH YOUR NAUSEA MEDICATION *UNUSUAL SHORTNESS OF BREATH *UNUSUAL BRUISING OR BLEEDING *URINARY PROBLEMS (pain or burning when urinating, or frequent urination) *BOWEL PROBLEMS (unusual diarrhea, constipation, pain near the anus) TENDERNESS IN MOUTH AND THROAT WITH OR WITHOUT PRESENCE OF ULCERS (sore throat, sores in mouth, or a toothache) UNUSUAL RASH, SWELLING OR PAIN  UNUSUAL VAGINAL DISCHARGE OR ITCHING   Items with * indicate a potential emergency and should be followed up as soon as possible or go to the Emergency Department if any problems should occur.  Please show the CHEMOTHERAPY ALERT CARD or IMMUNOTHERAPY ALERT  CARD at check-in to the Emergency Department and triage nurse.  Should you have questions after your visit or need to cancel or reschedule your appointment, please contact [facility]  Dept: [phone]  and follow the prompts.  Office hours are 8:00 a.m. to 4:30 p.m. Monday - Friday. Please note that voicemails left after 4:00 p.m. may not be returned until the following business day.  We are closed weekends and major holidays. You have access to a nurse at all times for urgent questions. Please call the main number to the clinic Dept: [phone] and follow the prompts.   For any non-urgent questions, you may also contact your provider using MyChart. We now offer e-Visits for anyone 39 and older to request care online for non-urgent symptoms. For details visit [email or web address].   Also download the MyChart app! Go to the app store, search "MyChart", open the app, select [facility], and log in with your MyChart username and password.  Due to Covid, a mask is required upon entering the hospital/clinic. If you do not have a mask, one will be given to you upon arrival. For doctor visits, patients may have 1 support person aged 92 or older with them. For treatment visits, patients cannot have anyone with them due to current Covid guidelines and our immunocompromised population.

## 2021-05-29 NOTE — Progress Notes (Signed)
Continue with Carboplatin 510mg  (AUC 4.4) per MD.  Acquanetta Belling, RPH, BCPS, BCOP 05/29/2021 9:26 AM

## 2021-05-31 ENCOUNTER — Telehealth: Payer: Self-pay | Admitting: Internal Medicine

## 2021-05-31 NOTE — Telephone Encounter (Signed)
Scheduled per los. Called and left msg. Mailed printout  °

## 2021-06-01 ENCOUNTER — Other Ambulatory Visit: Payer: Self-pay | Admitting: Family Medicine

## 2021-06-01 ENCOUNTER — Other Ambulatory Visit (HOSPITAL_COMMUNITY): Payer: Self-pay

## 2021-06-01 DIAGNOSIS — K219 Gastro-esophageal reflux disease without esophagitis: Secondary | ICD-10-CM

## 2021-06-02 ENCOUNTER — Other Ambulatory Visit (HOSPITAL_COMMUNITY): Payer: Self-pay

## 2021-06-02 MED ORDER — PANTOPRAZOLE SODIUM 40 MG PO TBEC
40.0000 mg | DELAYED_RELEASE_TABLET | Freq: Every evening | ORAL | 5 refills | Status: DC
  Filled 2021-06-02: qty 30, 30d supply, fill #0
  Filled 2021-06-30: qty 30, 30d supply, fill #1
  Filled 2021-08-08: qty 30, 30d supply, fill #2
  Filled 2021-09-05: qty 90, 90d supply, fill #3

## 2021-06-05 ENCOUNTER — Encounter: Payer: Self-pay | Admitting: Internal Medicine

## 2021-06-05 ENCOUNTER — Inpatient Hospital Stay: Payer: 59

## 2021-06-05 ENCOUNTER — Other Ambulatory Visit (HOSPITAL_COMMUNITY): Payer: Self-pay

## 2021-06-05 ENCOUNTER — Other Ambulatory Visit: Payer: Self-pay

## 2021-06-05 ENCOUNTER — Encounter: Payer: Self-pay | Admitting: Urology

## 2021-06-05 DIAGNOSIS — C3411 Malignant neoplasm of upper lobe, right bronchus or lung: Secondary | ICD-10-CM | POA: Diagnosis not present

## 2021-06-05 DIAGNOSIS — C7931 Secondary malignant neoplasm of brain: Secondary | ICD-10-CM | POA: Diagnosis not present

## 2021-06-05 DIAGNOSIS — C7951 Secondary malignant neoplasm of bone: Secondary | ICD-10-CM | POA: Diagnosis not present

## 2021-06-05 DIAGNOSIS — Z5112 Encounter for antineoplastic immunotherapy: Secondary | ICD-10-CM | POA: Diagnosis not present

## 2021-06-05 DIAGNOSIS — C3491 Malignant neoplasm of unspecified part of right bronchus or lung: Secondary | ICD-10-CM

## 2021-06-05 DIAGNOSIS — Z5111 Encounter for antineoplastic chemotherapy: Secondary | ICD-10-CM | POA: Diagnosis not present

## 2021-06-05 LAB — CBC WITH DIFFERENTIAL (CANCER CENTER ONLY)
Abs Immature Granulocytes: 0.05 10*3/uL (ref 0.00–0.07)
Basophils Absolute: 0 10*3/uL (ref 0.0–0.1)
Basophils Relative: 0 %
Eosinophils Absolute: 0.1 10*3/uL (ref 0.0–0.5)
Eosinophils Relative: 1 %
HCT: 38.3 % — ABNORMAL LOW (ref 39.0–52.0)
Hemoglobin: 12.9 g/dL — ABNORMAL LOW (ref 13.0–17.0)
Immature Granulocytes: 1 %
Lymphocytes Relative: 18 %
Lymphs Abs: 1.5 10*3/uL (ref 0.7–4.0)
MCH: 30.5 pg (ref 26.0–34.0)
MCHC: 33.7 g/dL (ref 30.0–36.0)
MCV: 90.5 fL (ref 80.0–100.0)
Monocytes Absolute: 0.5 10*3/uL (ref 0.1–1.0)
Monocytes Relative: 5 %
Neutro Abs: 6.5 10*3/uL (ref 1.7–7.7)
Neutrophils Relative %: 75 %
Platelet Count: 106 10*3/uL — ABNORMAL LOW (ref 150–400)
RBC: 4.23 MIL/uL (ref 4.22–5.81)
RDW: 16.4 % — ABNORMAL HIGH (ref 11.5–15.5)
WBC Count: 8.7 10*3/uL (ref 4.0–10.5)
nRBC: 0 % (ref 0.0–0.2)

## 2021-06-05 LAB — CMP (CANCER CENTER ONLY)
ALT: 62 U/L — ABNORMAL HIGH (ref 0–44)
AST: 26 U/L (ref 15–41)
Albumin: 3.5 g/dL (ref 3.5–5.0)
Alkaline Phosphatase: 90 U/L (ref 38–126)
Anion gap: 10 (ref 5–15)
BUN: 15 mg/dL (ref 8–23)
CO2: 27 mmol/L (ref 22–32)
Calcium: 9.6 mg/dL (ref 8.9–10.3)
Chloride: 106 mmol/L (ref 98–111)
Creatinine: 0.79 mg/dL (ref 0.61–1.24)
GFR, Estimated: >60 mL/min (ref >60)
Glucose, Bld: 106 mg/dL — ABNORMAL HIGH (ref 70–99)
Potassium: 3.9 mmol/L (ref 3.5–5.1)
Sodium: 143 mmol/L (ref 135–145)
Total Bilirubin: 0.3 mg/dL (ref 0.3–1.2)
Total Protein: 6.4 g/dL — ABNORMAL LOW (ref 6.5–8.1)

## 2021-06-05 MED ORDER — NYSTATIN 100000 UNIT/ML MT SUSP
OROMUCOSAL | 1 refills | Status: DC
  Filled 2021-06-05: qty 240, 12d supply, fill #0
  Filled 2021-06-23 – 2021-07-10 (×2): qty 240, 12d supply, fill #1

## 2021-06-05 NOTE — Progress Notes (Signed)
Patient reports doing well other than mild fatigue, constipation, and a sore tongue (possible thrush). Decadron use of 2mg 's daily.  Denies headaches, skin irritation, vision/ hearing changes,  nausea, motor/ gait/ cognitive issues, or aphasia.  Meaningful use questions complete and patient notified of 10:30am telephone appointment on 06/08/21 and expressed understanding.

## 2021-06-06 ENCOUNTER — Other Ambulatory Visit (HOSPITAL_COMMUNITY): Payer: Self-pay

## 2021-06-06 ENCOUNTER — Encounter: Payer: Self-pay | Admitting: "Endocrinology

## 2021-06-06 ENCOUNTER — Ambulatory Visit: Payer: 59 | Admitting: "Endocrinology

## 2021-06-06 ENCOUNTER — Encounter: Payer: Self-pay | Admitting: Internal Medicine

## 2021-06-06 VITALS — BP 119/86 | HR 108 | Ht 72.0 in | Wt 164.6 lb

## 2021-06-06 DIAGNOSIS — F17200 Nicotine dependence, unspecified, uncomplicated: Secondary | ICD-10-CM

## 2021-06-06 DIAGNOSIS — Z833 Family history of diabetes mellitus: Secondary | ICD-10-CM | POA: Diagnosis not present

## 2021-06-06 DIAGNOSIS — C7800 Secondary malignant neoplasm of unspecified lung: Secondary | ICD-10-CM | POA: Diagnosis not present

## 2021-06-06 DIAGNOSIS — E230 Hypopituitarism: Secondary | ICD-10-CM | POA: Diagnosis not present

## 2021-06-06 MED ORDER — TESTOSTERONE CYPIONATE 100 MG/ML IM SOLN: 50.0000 mg | INTRAMUSCULAR | 0 refills | Status: DC

## 2021-06-06 MED ORDER — TESTOSTERONE CYPIONATE 100 MG/ML IM SOLN
INTRAMUSCULAR | 0 refills | Status: DC
  Filled 2021-06-06: qty 10, 90d supply, fill #0

## 2021-06-06 MED ORDER — LEVOTHYROXINE SODIUM 25 MCG PO TABS
25.0000 ug | ORAL_TABLET | Freq: Every day | ORAL | 1 refills | Status: DC
  Filled 2021-06-06: qty 90, 90d supply, fill #0
  Filled 2021-08-30: qty 90, 90d supply, fill #1

## 2021-06-06 MED ORDER — "SYRINGE/NEEDLE (DISP) 21G X 1-1/2"" 3 ML MISC"
0 refills | Status: DC
  Filled 2021-06-06: qty 12, 84d supply, fill #0

## 2021-06-06 NOTE — Progress Notes (Signed)
Endocrinology Consult Note                                            06/06/2021, 4:42 PM   Subjective:    Patient ID: Bradley Goodman, male    DOB: 1956/05/17, PCP [name], FNP   Past Medical History:  Diagnosis Date   Cervical spondylolysis    Essential hypertension    GERD (gastroesophageal reflux disease)    History of kidney stones    History of migraine    Hyperlipidemia    Hypertension    PONV (postoperative nausea and vomiting)    Type 2 diabetes mellitus ([place])    Past Surgical History:  Procedure Laterality Date   Bilateral inguinal hernia repair     BRONCHIAL NEEDLE ASPIRATION BIOPSY  04/05/2021   Procedure: BRONCHIAL NEEDLE ASPIRATION BIOPSIES;  Surgeon: Garner Nash, DO;  Location: [facility];  Service: Pulmonary;;   COLONOSCOPY  01/23/2012   Procedure: COLONOSCOPY;  Surgeon: Daneil Dolin, MD;  [facility];  Service: Endoscopy;  Laterality: N/A;  9:30 AM   COLONOSCOPY N/A 10/11/2015   Procedure: COLONOSCOPY;  Surgeon: Daneil Dolin, MD;  [facility];  Service: Endoscopy;  Laterality: N/A;  830   COLONOSCOPY N/A 10/14/2019   Procedure: COLONOSCOPY;  Surgeon: Daneil Dolin, MD;  [facility];  Service: Endoscopy;  Laterality: N/A;  1:45   POLYPECTOMY  10/14/2019   Procedure: POLYPECTOMY;  Surgeon: Daneil Dolin, MD;  [facility];  Service: Endoscopy;;  ascending colon, descending colon   VIDEO BRONCHOSCOPY WITH ENDOBRONCHIAL ULTRASOUND N/A 04/05/2021   Procedure: VIDEO BRONCHOSCOPY WITH ENDOBRONCHIAL ULTRASOUND;  Surgeon: Garner Nash, DO;  Location: [facility];  Service: Pulmonary;  Laterality: N/A;   Social History   Socioeconomic History   Marital status: Married    Spouse name: Not on file   Number of children: Not on file   Years of education: Not on file   Highest education level: Not on file  Occupational History   Not on file  Tobacco Use   Smoking status: Every Day     Packs/day: 0.50    Years: 30.00    Pack years: 15.00    Types: Cigarettes    Start date: 10/30/1973   Smokeless tobacco: Never  Vaping Use   Vaping Use: Never used  Substance and Sexual Activity   Alcohol use: Yes    Alcohol/week: 0.0 standard drinks    Comment: One drink every 6 months.   Drug use: No   Sexual activity: Yes  Other Topics Concern   Not on file  Social History Narrative   Not on file   Social Determinants of Health   Financial Resource Strain: Not on file  Food Insecurity: Not on file  Transportation Needs: Not on file  Physical Activity: Not on file  Stress: Not on file  Social Connections: Not on file   Family History  Problem Relation Age of Onset   Hypertension Mother    Diabetes Mother    Heart attack Mother    Hypertension Father    Heart attack Father    Heart attack Brother    Colon cancer Neg Hx    Outpatient Encounter Medications as of 06/06/2021  Medication Sig   levothyroxine (SYNTHROID) 25 MCG tablet Take 1  tablet (25 mcg total) by mouth daily before breakfast.   SYRINGE-NEEDLE, DISP, 3 ML 21G X 1-1/2" 3 ML MISC Use to inject testosterone every week   testosterone cypionate (DEPO-TESTOSTERONE) 100 MG/ML injection Inject 0.5 mLs (50 mg total) into the muscle every 7 (seven) days. For IM use only   cholecalciferol (VITAMIN D) 1000 UNITS tablet Take 1,000 Units by mouth every evening.   dexamethasone (DECADRON) 1 MG tablet Take 2 tablets (2 mg total) by mouth daily. (Patient taking differently: Take 1 mg by mouth daily.)   folic acid (FOLVITE) 1 MG tablet Take 1 tablet (1 mg total) by mouth daily.   Krill Oil 300 MG CAPS Take 300 mg by mouth every evening.   loratadine (CLARITIN) 10 MG tablet Take 10 mg by mouth every evening.    losartan (COZAAR) 50 MG tablet TAKE 1 AND 1/2 TABLETS BY MOUTH DAILY   magic mouthwash (nystatin, diphenhydrAMINE, alum & mag hydroxide) suspension mixture Swish and spit 5 mls up to 4 times daily as needed.    mirtazapine (REMERON) 15 MG tablet Take 1 tablet (15 mg total) by mouth at bedtime.   Multiple Vitamins-Minerals (MULTIVITAMIN GUMMIES ADULT PO) Take 2 each by mouth daily.   pantoprazole (PROTONIX) 40 MG tablet Take 1 tablet by mouth every evening.   prochlorperazine (COMPAZINE) 10 MG tablet Take 1 tablet (10 mg total) by mouth every 6 (six) hours as needed for nausea or vomiting.   rosuvastatin (CRESTOR) 20 MG tablet Take 1 tablet (20 mg total) by mouth daily.   venlafaxine XR (EFFEXOR-XR) 75 MG 24 hr capsule Take 1 capsule (75 mg total) by mouth daily with breakfast.   No facility-administered encounter medications on file as of 06/06/2021.   ALLERGIES: No Known Allergies  VACCINATION STATUS: Immunization History  Administered Date(s) Administered   Hepatitis B 02/06/1990, 10/28/1990, 04/02/1991   Influenza,inj,Quad PF,6+ Mos 07/07/2019   Influenza-Unspecified 07/25/2018   Moderna Sars-Covid-2 Vaccination 12/01/2019, 12/30/2019, 10/19/2020   Pneumococcal Conjugate-13 03/11/2017   Tdap 01/17/2016    HPI Bradley Goodman is 65 y.o. male who presents today with a medical history as above. 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  History is obtained directly from the patient as well as chart review. He is accompanied by his wife.  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  He is status post radiosurgery of CNS metastasis and chemotherapy.  His chemotherapy in the involved high-dose steroids in the form of dexamethasone, currently on dexamethasone 1 mg p.o. daily. During this process, he developed fatigue, low libido, and loss of skeletal muscle mass.  His labs showed hypogonadism, partial adrenal insufficiency, and partial hypothyroidism. He is not on hormone replacement except for the Decadron 1 mg p.o. daily.  He denies prior testicular injury.  He denies testicular radiation . He  wishes to be treated with testosterone and  for other hormone deficiencies as necessary.    Review of Systems  Constitutional: + Minimal weight change,  + fatigue, + subjective hypothermia, + low libido Eyes: no blurry vision, no xerophthalmia ENT: no sore throat, no nodules palpated in throat, no dysphagia/odynophagia, no hoarseness Cardiovascular: no Chest Pain, no Shortness of Breath, no palpitations, no leg swelling Respiratory: no cough, no shortness of breath Gastrointestinal: no Nausea/Vomiting/Diarhhea Musculoskeletal: no muscle/joint aches Skin: no rashes Neurological: no tremors, no numbness, no tingling, no dizziness Psychiatric: no depression, no anxiety  Objective:  Vitals with BMI 06/06/2021 05/29/2021 05/29/2021  Height 6\' 0"  - 6\' 0"   Weight 164 lbs 10 oz - 162 lbs 11 oz  BMI 08.65 - 78.46  Systolic 962 - 952  Diastolic 86 - 77  Pulse 841 99 108    BP 119/86   Pulse (!) 108   Ht 6' (1.829 m)   Wt 164 lb 9.6 oz (74.7 kg)   BMI 22.32 kg/m   Wt Readings from Last 3 Encounters:  06/06/21 164 lb 9.6 oz (74.7 kg)  05/29/21 162 lb 11.2 oz (73.8 kg)  05/16/21 159 lb 3.2 oz (72.2 kg)    Physical Exam  Constitutional:  Body mass index is 22.32 kg/m.,  not in acute distress, normal state of mind Eyes: PERRLA, EOMI, no exophthalmos ENT: moist mucous membranes, no gross thyromegaly, no gross cervical lymphadenopathy Cardiovascular: normal precordial activity, Regular Rate and Rhythm, no Murmur/Rubs/Gallops Respiratory:  adequate breathing efforts, no gross chest deformity, Clear to auscultation bilaterally Gastrointestinal: abdomen soft, Non -tender, No distension, Bowel Sounds present, no gross organomegaly Musculoskeletal: no gross deformities, strength intact in all four extremities Skin: moist, warm, no rashes Neurological: no tremor with outstretched hands, Deep tendon reflexes normal in bilateral lower extremities.  CMP ( most recent) CMP     Component  Value Date/Time   NA 143 06/05/2021 1439   NA 144 01/10/2021 0849   K 3.9 06/05/2021 1439   CL 106 06/05/2021 1439   CO2 27 06/05/2021 1439   GLUCOSE 106 (H) 06/05/2021 1439   BUN 15 06/05/2021 1439   BUN 15 01/10/2021 0849   CREATININE 0.79 06/05/2021 1439   CALCIUM 9.6 06/05/2021 1439   PROT 6.4 (L) 06/05/2021 1439   PROT 6.3 01/10/2021 0849   ALBUMIN 3.5 06/05/2021 1439   ALBUMIN 4.2 01/10/2021 0849   AST 26 06/05/2021 1439   ALT 62 (H) 06/05/2021 1439   ALKPHOS 90 06/05/2021 1439   BILITOT 0.3 06/05/2021 1439   GFRNONAA >60 06/05/2021 1439   GFRAA 82 07/14/2020 0945     Diabetic Labs (most recent): Lab Results  Component Value Date   HGBA1C 6.2 01/10/2021   HGBA1C 5.9 07/14/2020   HGBA1C 5.9 03/09/2020     Lipid Panel ( most recent) Lipid Panel     Component Value Date/Time   CHOL 95 (L) 01/10/2021 0849   TRIG 180 (H) 01/10/2021 0849   TRIG 87 10/18/2016 0851   HDL 33 (L) 01/10/2021 0849   HDL 37 (L) 10/18/2016 0851   CHOLHDL 2.9 01/10/2021 0849   LDLCALC 33 01/10/2021 0849   LDLCALC 53 07/13/2014 0925   LABVLDL 29 01/10/2021 0849      Lab Results  Component Value Date   TSH 0.400 05/23/2021   TSH 0.489 05/10/2021   TSH 0.849 05/08/2021   TSH 2.900 11/09/2019   TSH 2.110 09/18/2018    Results for Bradley Goodman, Bradley Goodman (MRN [id number]) as of 06/06/2021 16:47  Ref. Range 05/10/2021 08:15 05/15/2021 10:11 05/23/2021 14:26  Cortisol, Plasma Latest Units: ug/dL 1.8    LH Latest Ref Range: 1.7 - 8.6 mIU/mL <0.3 (L)    FSH Latest Ref Range: 1.5 - 12.4 mIU/mL 0.4 (L)    Prolactin Latest Ref Range: 4.0 - 15.2 ng/mL 27.0 (H)    Somatomedin C Latest Ref Range: 64 - 240 ng/mL 115    Glucose Latest Ref Range: 70 - 99 mg/dL  95 200 (H)  Glucose, Fasting Latest Ref Range: 70 - 99 mg/dL 97    Testosterone  Latest Ref Range: 264 - 916 ng/dL <3 (L)    TSH Latest Ref Range: 0.320 - 4.118 uIU/mL 0.489  0.400  Thyroxine (T4) Latest Ref Range: 4.5 - 12.0 ug/dL 5.5            Assessment & Plan:   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 - I have reviewed his available medical and endocrine records and clinically evaluated the patient. 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 -While his ongoing treatment with Decadron might have helped with adrenal insufficiency, he will be sent for ACTH stimulation test.  He is advised not to discontinue Decadron until his next visit.  If he is found to have significant adrenal insufficiency, he will be considered for hydrocortisone or prednisone for long-term glucocorticoid replacement.  -His labs are also consistent with partial , likely secondary, hypothyroidism.  He would benefit from supplemental thyroid hormone.  I discussed initiated levothyroxine 25 mcg p.o. daily before breakfast.   - We discussed about the correct intake of his thyroid hormone, on empty stomach at fasting, with water, separated by at least 30 minutes from breakfast and other medications,  and separated by more than 4 hours from calcium, iron, multivitamins, acid reflux medications (PPIs). -Patient is made aware of the fact that thyroid hormone replacement is needed for life, dose to be adjusted by periodic monitoring of thyroid function tests.  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  I discussed safe use of testosterone and complications from inappropriate testosterone treatment.  He wishes to commit for appropriate follow-up and measurements.  I discussed initiated testosterone 50 mg IM every 7 days with plan to repeat labs in 3 months. -Treatment target for him will be between 250-350 ng per DL, with the aim being to help him with libido as well as possibly avoid further loss of skeletal muscle  mass.  - he is advised to maintain close follow up with his oncologist, neurologist, radiologist and PCP [name], FNP for primary care needs.   - Time spent with the patient: 60 minutes, of which >50% was spent in  counseling him about his   panhypopituitarism (hypogonadism, hypothyroidism, adrenal insufficiency ) and the rest in obtaining information about his symptoms, reviewing his previous labs/studies ( including abstractions from other facilities),  evaluations, and treatments,  and developing a plan to confirm diagnosis and long term treatment based on the latest standards of care/guidelines; and documenting his care.  Ernesta Amble Craze participated in the discussions, expressed understanding, and voiced agreement with the above plans.  All questions were answered to his satisfaction. he is encouraged to contact clinic should he have any questions or concerns prior to his return visit.  Follow up plan: Return in about 2 weeks (around 06/20/2021) for F/U with Pre-visit Labs.   Glade Lloyd, MD [facility] [address] [place], [place] [address] Phone: [phone]  Fax: [phone]     06/06/2021, 4:42 PM  This note was partially dictated with voice recognition software. Similar sounding words can be transcribed inadequately or may not  be corrected upon review.

## 2021-06-07 ENCOUNTER — Telehealth: Payer: Self-pay

## 2021-06-07 NOTE — Telephone Encounter (Signed)
Started prior authorization for patient. Key #[place]  PA Case ID: [id number] Waiting for response

## 2021-06-07 NOTE — Progress Notes (Signed)
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  Interval Since Last Radiation:  4 weeks  SRS//04/24/21 - 05/04/21:    Narrative:  I spoke with the patient to conduct his routine scheduled 1 month follow up visit via telephone to spare the patient unnecessary potential exposure in the healthcare setting during the current COVID-19 pandemic.  The patient was notified in advance and gave permission to proceed with this visit format.  He tolerated treatment well without any ill side effects.                              On review of systems, the patient states that he is doing very well in general and is currently without complaints.  He has had complete resolution of the visual deficits noted at presentation, prior to treatment.  He reports an occasional mild headache intermittently but not significant enough to even warrant taking a Tylenol or Motrin.  He denies any issues with imbalance, focal weakness, seizure activity or tremor.  He had a recent visit with Dr. Mickeal Skinner on 05/23/2021 and is currently weaning off of Decadron, now taking 1 mg daily.  He will have a posttreatment MRI brain scan in 2 months and will follow-up with Dr. Mickeal Skinner thereafter on 08/07/2021 to review results and recommendations.  He has completed 2 cycles of systemic therapy, as of 05/29/2021, under the care and direction of Dr. Earlie Server and feels that he is tolerating these treatments well.  ALLERGIES:  has No Known Allergies.  Meds: Current Outpatient Medications  Medication Sig Dispense Refill   cholecalciferol (VITAMIN D) 1000 UNITS tablet Take 1,000 Units by mouth every evening.     dexamethasone (DECADRON) 1 MG tablet Take 2 tablets (2 mg  total) by mouth daily. (Patient taking differently: Take 1 mg by mouth daily.) 60 tablet 1   folic acid (FOLVITE) 1 MG tablet Take 1 tablet (1 mg total) by mouth daily. 30 tablet 4   Krill Oil 300 MG CAPS Take 300 mg by mouth every evening.     loratadine (CLARITIN) 10 MG tablet Take 10 mg by mouth every evening.      losartan (COZAAR) 50 MG tablet TAKE 1 AND 1/2 TABLETS BY MOUTH DAILY 135 tablet 1   mirtazapine (REMERON) 15 MG tablet Take 1 tablet (15 mg total) by mouth at bedtime. 30 tablet 2   Multiple Vitamins-Minerals (MULTIVITAMIN GUMMIES ADULT PO) Take 2 each by mouth daily.     pantoprazole (PROTONIX) 40 MG tablet Take 1 tablet by mouth every evening. 30 tablet 5   prochlorperazine (COMPAZINE) 10 MG tablet Take 1 tablet (10 mg total) by mouth every 6 (six) hours as needed for nausea or vomiting. 30 tablet 0   rosuvastatin (CRESTOR) 20 MG tablet Take 1 tablet (20 mg total) by mouth daily. 90 tablet 1   venlafaxine XR (EFFEXOR-XR) 75 MG 24 hr capsule Take 1 capsule (75 mg total) by mouth daily with breakfast. 90 capsule 1   levothyroxine (SYNTHROID) 25 MCG tablet Take 1 tablet (25 mcg total) by mouth daily before breakfast. 90 tablet 1   magic mouthwash (nystatin, diphenhydrAMINE,  alum & mag hydroxide) suspension mixture Swish and spit 5 mls up to 4 times daily as needed. 240 mL 1   SYRINGE-NEEDLE, DISP, 3 ML 21G X 1-1/2" 3 ML MISC Use to inject testosterone every week 50 each 0   testosterone cypionate (DEPO-TESTOSTERONE) 100 MG/ML injection Inject 0.5 mLs (50 mg total) into the muscle every 7 (seven) days. For IM use only 10 mL 0   testosterone cypionate (DEPOTESTOTERONE CYPIONATE) 100 MG/ML injection Inject 0.5 MLs into the muscle every 7 days 10 mL 0   No current facility-administered medications for this encounter.    Physical Findings:  vitals were not taken for this visit.  Pain Assessment Pain Score: 0-No pain/10 Unable to assess due to telephone office visit follow up.  Lab  Findings: Lab Results  Component Value Date   WBC 8.7 06/05/2021   HGB 12.9 (L) 06/05/2021   HCT 38.3 (L) 06/05/2021   MCV 90.5 06/05/2021   PLT 106 (L) 06/05/2021     Radiographic Findings: No results found.  Impression/Plan: 74. 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 He appears to have recovered well from the effects of his recent stereotactic radiosurgery and is currently without complaints.  We discussed that while we are happy to continue to participate in his care clinically indicated, at this point, we will plan to see him back on an as-needed basis.  He is scheduled for a follow-up visit with Dr. Mickeal Skinner on 08/07/2021 and will have a posttreatment MRI brain scan prior to that visit.  We enjoyed taking care of him and will continue to participate in review of his MRI brain scans in multidisciplinary brain tumor board. He will continue in routine follow up with Dr. Julien Nordmann as well, for continued management of his systemic disease.  He appears to have a good understanding of these recommendations and is comfortable and in agreement with the stated plan.    Nicholos Johns, PA-C

## 2021-06-08 ENCOUNTER — Ambulatory Visit
Admission: RE | Admit: 2021-06-08 | Discharge: 2021-06-08 | Disposition: A | Discharge status: Home / Self Care | Payer: 59 | Source: Ambulatory Visit | Attending: Urology | Admitting: Urology

## 2021-06-08 DIAGNOSIS — C7931 Secondary malignant neoplasm of brain: Secondary | ICD-10-CM

## 2021-06-13 ENCOUNTER — Inpatient Hospital Stay: Payer: 59 | Attending: Physician Assistant

## 2021-06-13 ENCOUNTER — Other Ambulatory Visit: Payer: Self-pay

## 2021-06-13 DIAGNOSIS — C7951 Secondary malignant neoplasm of bone: Secondary | ICD-10-CM | POA: Insufficient documentation

## 2021-06-13 DIAGNOSIS — C3491 Malignant neoplasm of unspecified part of right bronchus or lung: Secondary | ICD-10-CM

## 2021-06-13 DIAGNOSIS — Z5111 Encounter for antineoplastic chemotherapy: Secondary | ICD-10-CM | POA: Insufficient documentation

## 2021-06-13 DIAGNOSIS — Z5112 Encounter for antineoplastic immunotherapy: Secondary | ICD-10-CM | POA: Insufficient documentation

## 2021-06-13 DIAGNOSIS — C7931 Secondary malignant neoplasm of brain: Secondary | ICD-10-CM | POA: Insufficient documentation

## 2021-06-13 DIAGNOSIS — C3411 Malignant neoplasm of upper lobe, right bronchus or lung: Secondary | ICD-10-CM | POA: Diagnosis not present

## 2021-06-13 LAB — CBC WITH DIFFERENTIAL (CANCER CENTER ONLY)
Abs Immature Granulocytes: 0.09 10*3/uL — ABNORMAL HIGH (ref 0.00–0.07)
Basophils Absolute: 0 10*3/uL (ref 0.0–0.1)
Basophils Relative: 0 %
Eosinophils Absolute: 0 10*3/uL (ref 0.0–0.5)
Eosinophils Relative: 1 %
HCT: 37.5 % — ABNORMAL LOW (ref 39.0–52.0)
Hemoglobin: 12.3 g/dL — ABNORMAL LOW (ref 13.0–17.0)
Immature Granulocytes: 1 %
Lymphocytes Relative: 22 %
Lymphs Abs: 1.6 10*3/uL (ref 0.7–4.0)
MCH: 30.8 pg (ref 26.0–34.0)
MCHC: 32.8 g/dL (ref 30.0–36.0)
MCV: 93.8 fL (ref 80.0–100.0)
Monocytes Absolute: 0.4 10*3/uL (ref 0.1–1.0)
Monocytes Relative: 6 %
Neutro Abs: 5.1 10*3/uL (ref 1.7–7.7)
Neutrophils Relative %: 70 %
Platelet Count: 214 10*3/uL (ref 150–400)
RBC: 4 MIL/uL — ABNORMAL LOW (ref 4.22–5.81)
RDW: 18 % — ABNORMAL HIGH (ref 11.5–15.5)
WBC Count: 7.3 10*3/uL (ref 4.0–10.5)
nRBC: 0 % (ref 0.0–0.2)

## 2021-06-13 LAB — CMP (CANCER CENTER ONLY)
ALT: 49 U/L — ABNORMAL HIGH (ref 0–44)
AST: 23 U/L (ref 15–41)
Albumin: 3.6 g/dL (ref 3.5–5.0)
Alkaline Phosphatase: 85 U/L (ref 38–126)
Anion gap: 12 (ref 5–15)
BUN: 9 mg/dL (ref 8–23)
CO2: 23 mmol/L (ref 22–32)
Calcium: 9.1 mg/dL (ref 8.9–10.3)
Chloride: 109 mmol/L (ref 98–111)
Creatinine: 0.94 mg/dL (ref 0.61–1.24)
GFR, Estimated: >60 mL/min (ref >60)
Glucose, Bld: 112 mg/dL — ABNORMAL HIGH (ref 70–99)
Potassium: 4 mmol/L (ref 3.5–5.1)
Sodium: 144 mmol/L (ref 135–145)
Total Bilirubin: <0.2 mg/dL — ABNORMAL LOW (ref 0.3–1.2)
Total Protein: 6.6 g/dL (ref 6.5–8.1)

## 2021-06-14 LAB — TSH: TSH: 1.319 u[IU]/mL (ref 0.320–4.118)

## 2021-06-15 ENCOUNTER — Other Ambulatory Visit (HOSPITAL_COMMUNITY): Payer: Self-pay

## 2021-06-15 ENCOUNTER — Encounter: Payer: Self-pay | Admitting: Internal Medicine

## 2021-06-16 ENCOUNTER — Other Ambulatory Visit (HOSPITAL_COMMUNITY): Payer: Self-pay

## 2021-06-19 ENCOUNTER — Encounter: Payer: Self-pay | Admitting: Internal Medicine

## 2021-06-19 ENCOUNTER — Inpatient Hospital Stay: Payer: 59

## 2021-06-19 ENCOUNTER — Other Ambulatory Visit: Payer: Self-pay

## 2021-06-19 ENCOUNTER — Inpatient Hospital Stay: Payer: 59 | Admitting: Internal Medicine

## 2021-06-19 VITALS — BP 114/85 | HR 103 | Temp 97.9°F | Resp 19 | Ht 72.0 in | Wt 168.5 lb

## 2021-06-19 DIAGNOSIS — C3490 Malignant neoplasm of unspecified part of unspecified bronchus or lung: Secondary | ICD-10-CM

## 2021-06-19 DIAGNOSIS — C7931 Secondary malignant neoplasm of brain: Secondary | ICD-10-CM

## 2021-06-19 DIAGNOSIS — Z5111 Encounter for antineoplastic chemotherapy: Secondary | ICD-10-CM | POA: Diagnosis not present

## 2021-06-19 DIAGNOSIS — C3491 Malignant neoplasm of unspecified part of right bronchus or lung: Secondary | ICD-10-CM

## 2021-06-19 DIAGNOSIS — Z5112 Encounter for antineoplastic immunotherapy: Secondary | ICD-10-CM | POA: Diagnosis not present

## 2021-06-19 DIAGNOSIS — C7951 Secondary malignant neoplasm of bone: Secondary | ICD-10-CM | POA: Diagnosis not present

## 2021-06-19 DIAGNOSIS — C3411 Malignant neoplasm of upper lobe, right bronchus or lung: Secondary | ICD-10-CM | POA: Diagnosis not present

## 2021-06-19 LAB — CMP (CANCER CENTER ONLY)
ALT: 57 U/L — ABNORMAL HIGH (ref 0–44)
AST: 28 U/L (ref 15–41)
Albumin: 3.6 g/dL (ref 3.5–5.0)
Alkaline Phosphatase: 90 U/L (ref 38–126)
Anion gap: 10 (ref 5–15)
BUN: 13 mg/dL (ref 8–23)
CO2: 27 mmol/L (ref 22–32)
Calcium: 9.3 mg/dL (ref 8.9–10.3)
Chloride: 106 mmol/L (ref 98–111)
Creatinine: 0.85 mg/dL (ref 0.61–1.24)
GFR, Estimated: >60 mL/min (ref >60)
Glucose, Bld: 108 mg/dL — ABNORMAL HIGH (ref 70–99)
Potassium: 3.5 mmol/L (ref 3.5–5.1)
Sodium: 143 mmol/L (ref 135–145)
Total Bilirubin: <0.2 mg/dL — ABNORMAL LOW (ref 0.3–1.2)
Total Protein: 6.5 g/dL (ref 6.5–8.1)

## 2021-06-19 LAB — CBC WITH DIFFERENTIAL (CANCER CENTER ONLY)
Abs Immature Granulocytes: 0.13 10*3/uL — ABNORMAL HIGH (ref 0.00–0.07)
Basophils Absolute: 0.1 10*3/uL (ref 0.0–0.1)
Basophils Relative: 1 %
Eosinophils Absolute: 0.1 10*3/uL (ref 0.0–0.5)
Eosinophils Relative: 1 %
HCT: 35.5 % — ABNORMAL LOW (ref 39.0–52.0)
Hemoglobin: 11.9 g/dL — ABNORMAL LOW (ref 13.0–17.0)
Immature Granulocytes: 2 %
Lymphocytes Relative: 28 %
Lymphs Abs: 2.4 10*3/uL (ref 0.7–4.0)
MCH: 31.2 pg (ref 26.0–34.0)
MCHC: 33.5 g/dL (ref 30.0–36.0)
MCV: 92.9 fL (ref 80.0–100.0)
Monocytes Absolute: 0.7 10*3/uL (ref 0.1–1.0)
Monocytes Relative: 8 %
Neutro Abs: 5.2 10*3/uL (ref 1.7–7.7)
Neutrophils Relative %: 60 %
Platelet Count: 258 10*3/uL (ref 150–400)
RBC: 3.82 MIL/uL — ABNORMAL LOW (ref 4.22–5.81)
RDW: 18.8 % — ABNORMAL HIGH (ref 11.5–15.5)
WBC Count: 8.5 10*3/uL (ref 4.0–10.5)
nRBC: 0 % (ref 0.0–0.2)

## 2021-06-19 MED ORDER — PALONOSETRON HCL INJECTION 0.25 MG/5ML
0.2500 mg | Freq: Once | INTRAVENOUS | Status: AC
  Administered 2021-06-19: 0.25 mg via INTRAVENOUS
  Filled 2021-06-19: qty 5

## 2021-06-19 MED ORDER — SODIUM CHLORIDE 0.9 % IV SOLN
500.0000 mg/m2 | Freq: Once | INTRAVENOUS | Status: AC
  Administered 2021-06-19: 1000 mg via INTRAVENOUS
  Filled 2021-06-19: qty 40

## 2021-06-19 MED ORDER — SODIUM CHLORIDE 0.9 % IV SOLN
150.0000 mg | Freq: Once | INTRAVENOUS | Status: AC
  Administered 2021-06-19: 150 mg via INTRAVENOUS
  Filled 2021-06-19: qty 150

## 2021-06-19 MED ORDER — CYANOCOBALAMIN 1000 MCG/ML IJ SOLN
1000.0000 ug | Freq: Once | INTRAMUSCULAR | Status: AC
Start: 2021-06-19 — End: 2021-06-19
  Administered 2021-06-19: 1000 ug via INTRAMUSCULAR
  Filled 2021-06-19: qty 1

## 2021-06-19 MED ORDER — SODIUM CHLORIDE 0.9 % IV SOLN: Freq: Once | INTRAVENOUS | Status: AC

## 2021-06-19 MED ORDER — SODIUM CHLORIDE 0.9 % IV SOLN
10.0000 mg | Freq: Once | INTRAVENOUS | Status: AC
  Administered 2021-06-19: 10 mg via INTRAVENOUS
  Filled 2021-06-19: qty 10

## 2021-06-19 MED ORDER — PEMBROLIZUMAB CHEMO INJECTION 100 MG/4ML
200.0000 mg | Freq: Once | INTRAVENOUS | Status: AC
Start: 2021-06-19 — End: 2021-06-19
  Administered 2021-06-19: 200 mg via INTRAVENOUS
  Filled 2021-06-19: qty 8

## 2021-06-19 MED ORDER — SODIUM CHLORIDE 0.9 % IV SOLN
539.0000 mg | Freq: Once | INTRAVENOUS | Status: AC
  Administered 2021-06-19: 540 mg via INTRAVENOUS
  Filled 2021-06-19: qty 54

## 2021-06-19 NOTE — Progress Notes (Signed)
[place] [phone]   [phone]  OFFICE PROGRESS NOTE  [name], [place] [address]  DIAGNOSIS: Stage IV (T1b, N3, M1C) non-small cell lung cancer, favoring adenocarcinoma presented with right upper lobe lung nodule in addition to right hilar, subcarinal and bilateral mediastinal as well as supraclavicular lymphadenopathy in addition to bone and brain metastasis diagnosed in June 2022.     PD-L1 expression 80%.     Molecular Studies:  Biomarker Findings Microsatellite status - MS-Stable Tumor Mutational Burden - 6 Muts/Mb Genomic Findings For a complete list of the genes assayed, please refer to the Appendix. KRAS G12C, amplification ATM S470* CCND1 amplification - equivocal? HGF amplification - equivocal? MYC amplification - equivocal? FGF19 amplification - equivocal? FGF3 amplification - equivocal? FGF4 amplification - equivocal? NFKBIA amplification NKX2-1 amplification RAD21 amplification - equivocal? [id number]*26 TERT promoter -124C>T TP53 rearrangement exon 9 7 Disease relevant genes with no reportable alterations: ALK, BRAF, EGFR, ERBB2, MET, RET, ROS1   PRIOR THERAPY: SRS to the metastatic brain lesions under the care of Dr. MTammi Klippel  Last treatment on 05/04/2021.   CURRENT THERAPY: Palliative systemic chemotherapy with carboplatin for an AUC 5, Alimta 500 mg per metered squared, Keytruda 200 mg IV every 3 weeks.  First dose expected on 05/08/2021.  Status post 2 cycles.  INTERVAL HISTORY: Bradley Goodman 65y.o. male returns to the clinic today for follow-up visit accompanied by his wife.  The patient is feeling fine today with no concerning complaints.  He tolerated the last cycle of his treatment well except for fatigue for few days.  He also has few episodes of constipation but this completely resolved.  He denied having any chest pain, shortness of breath, cough or hemoptysis.  He  denied having any nausea, vomiting, diarrhea or constipation.  He has no headache or visual changes.  He is here today for evaluation before starting cycle #3 of his treatment.  MEDICAL HISTORY: Past Medical History:  Diagnosis Date   Cervical spondylolysis    Essential hypertension    GERD (gastroesophageal reflux disease)    History of kidney stones    History of migraine    Hyperlipidemia    Hypertension    PONV (postoperative nausea and vomiting)    Type 2 diabetes mellitus (HCC)     ALLERGIES:  has No Known Allergies.  MEDICATIONS:  Current Outpatient Medications  Medication Sig Dispense Refill   cholecalciferol (VITAMIN D) 1000 UNITS tablet Take 1,000 Units by mouth every evening.     dexamethasone (DECADRON) 1 MG tablet Take 2 tablets (2 mg total) by mouth daily. (Patient taking differently: Take 1 mg by mouth daily.) 60 tablet 1   folic acid (FOLVITE) 1 MG tablet Take 1 tablet (1 mg total) by mouth daily. 30 tablet 4   Krill Oil 300 MG CAPS Take 300 mg by mouth every evening.     levothyroxine (SYNTHROID) 25 MCG tablet Take 1 tablet (25 mcg total) by mouth daily before breakfast. 90 tablet 1   loratadine (CLARITIN) 10 MG tablet Take 10 mg by mouth every evening.      losartan (COZAAR) 50 MG tablet TAKE 1 AND 1/2 TABLETS BY MOUTH DAILY 135 tablet 1   magic mouthwash (nystatin, diphenhydrAMINE, alum & mag hydroxide) suspension mixture Swish and spit 5 mls up to 4 times daily as needed. 240 mL 1   mirtazapine (REMERON) 15 MG tablet Take 1 tablet (15 mg total)  by mouth at bedtime. 30 tablet 2   Multiple Vitamins-Minerals (MULTIVITAMIN GUMMIES ADULT PO) Take 2 each by mouth daily.     pantoprazole (PROTONIX) 40 MG tablet Take 1 tablet by mouth every evening. 30 tablet 5   prochlorperazine (COMPAZINE) 10 MG tablet Take 1 tablet (10 mg total) by mouth every 6 (six) hours as needed for nausea or vomiting. 30 tablet 0   rosuvastatin (CRESTOR) 20 MG tablet Take 1 tablet (20 mg total)  by mouth daily. 90 tablet 1   SYRINGE-NEEDLE, DISP, 3 ML 21G X 1-1/2" 3 ML MISC Use to inject testosterone every week 50 each 0   testosterone cypionate (DEPO-TESTOSTERONE) 100 MG/ML injection Inject 0.5 mLs (50 mg total) into the muscle every 7 (seven) days. For IM use only 10 mL 0   testosterone cypionate (DEPOTESTOTERONE CYPIONATE) 100 MG/ML injection Inject 0.5 MLs into the muscle every 7 days 10 mL 0   venlafaxine XR (EFFEXOR-XR) 75 MG 24 hr capsule Take 1 capsule (75 mg total) by mouth daily with breakfast. 90 capsule 1   No current facility-administered medications for this visit.    SURGICAL HISTORY:  Past Surgical History:  Procedure Laterality Date   Bilateral inguinal hernia repair     BRONCHIAL NEEDLE ASPIRATION BIOPSY  04/05/2021   Procedure: BRONCHIAL NEEDLE ASPIRATION BIOPSIES;  Surgeon: Garner Nash, DO;  Location: [facility];  Service: Pulmonary;;   COLONOSCOPY  01/23/2012   Procedure: COLONOSCOPY;  Surgeon: Daneil Dolin, MD;  [facility];  Service: Endoscopy;  Laterality: N/A;  9:30 AM   COLONOSCOPY N/A 10/11/2015   Procedure: COLONOSCOPY;  Surgeon: Daneil Dolin, MD;  [facility];  Service: Endoscopy;  Laterality: N/A;  830   COLONOSCOPY N/A 10/14/2019   Procedure: COLONOSCOPY;  Surgeon: Daneil Dolin, MD;  [facility];  Service: Endoscopy;  Laterality: N/A;  1:45   POLYPECTOMY  10/14/2019   Procedure: POLYPECTOMY;  Surgeon: Daneil Dolin, MD;  [facility];  Service: Endoscopy;;  ascending colon, descending colon   VIDEO BRONCHOSCOPY WITH ENDOBRONCHIAL ULTRASOUND N/A 04/05/2021   Procedure: VIDEO BRONCHOSCOPY WITH ENDOBRONCHIAL ULTRASOUND;  Surgeon: Garner Nash, DO;  Location: [facility];  Service: Pulmonary;  Laterality: N/A;    REVIEW OF SYSTEMS:  A comprehensive review of systems was negative except for: Constitutional: positive for fatigue   PHYSICAL EXAMINATION: General appearance: alert, cooperative,  appears stated age, and no distress Head: Normocephalic, without obvious abnormality, atraumatic Neck: no adenopathy, no JVD, supple, symmetrical, trachea midline, and thyroid not enlarged, symmetric, no tenderness/mass/nodules Lymph nodes: Cervical, supraclavicular, and axillary nodes normal. Resp: clear to auscultation bilaterally Back: symmetric, no curvature. ROM normal. No CVA tenderness. Cardio: regular rate and rhythm, S1, S2 normal, no murmur, click, rub or gallop GI: soft, non-tender; bowel sounds normal; no masses,  no organomegaly Extremities: extremities normal, atraumatic, no cyanosis or edema  ECOG PERFORMANCE STATUS: 1 - Symptomatic but completely ambulatory  Blood pressure 114/85, pulse (!) 103, temperature 97.9 F (36.6 C), temperature source Tympanic, resp. rate 19, height 6' (1.829 m), weight 168 lb 8 oz (76.4 kg), SpO2 98 %.  LABORATORY DATA: Lab Results  Component Value Date   WBC 8.5 06/19/2021   HGB 11.9 (L) 06/19/2021   HCT 35.5 (L) 06/19/2021   MCV 92.9 06/19/2021   PLT 258 06/19/2021      Chemistry      Component Value Date/Time   NA 144 06/13/2021 1451   NA 144  01/10/2021 0849   K 4.0 06/13/2021 1451   CL 109 06/13/2021 1451   CO2 23 06/13/2021 1451   BUN 9 06/13/2021 1451   BUN 15 01/10/2021 0849   CREATININE 0.94 06/13/2021 1451      Component Value Date/Time   CALCIUM 9.1 06/13/2021 1451   ALKPHOS 85 06/13/2021 1451   AST 23 06/13/2021 1451   ALT 49 (H) 06/13/2021 1451   BILITOT <0.2 (L) 06/13/2021 1451       RADIOGRAPHIC STUDIES: No results found.  ASSESSMENT AND PLAN: This is a very pleasant 65 years old white male recently diagnosed with stage IV (T1b, N3, M1 C) non-small cell lung cancer favoring adenocarcinoma presented with right upper lobe lung nodule in addition to right hilar, subcarinal and bilateral mediastinal as well as supraclavicular lymphadenopathy.  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   His PD-L1 expression is 80% and his molecular studies showed KRAS G12C mutation. The patient underwent SRS to metastatic brain lesion under the care of Dr. Tammi Klippel and he is currently undergoing systemic chemotherapy with carboplatin for AUC of 5, Alimta 500 Mg/M2 and Keytruda 200 Mg IV every 3 weeks status post 2 cycles.   The patient continues to tolerate this treatment well with no concerning adverse effects except for mild fatigue. I recommended for him to proceed with cycle #3 today as planned. I will see him back for follow-up visit in 3 weeks for evaluation with repeat CT scan of the chest, abdomen pelvis for restaging of his disease. The patient was advised to call immediately if he has any other concerning symptoms in the interval. The patient voices understanding of current disease status and treatment options and is in agreement with the current care plan.  All questions were answered. The patient knows to call the clinic with any problems, questions or concerns. We can certainly see the patient much sooner if necessary.  The total time spent in the appointment was 20 minutes.  Disclaimer: This note was dictated with voice recognition software. Similar sounding words can inadvertently be transcribed and may not be corrected upon review.

## 2021-06-19 NOTE — Patient Instructions (Signed)
[facility]   Discharge Instructions: Thank you for choosing [facility] to provide your oncology and hematology care.   If you have a lab appointment with the Cancer Center, please go directly to the Cancer Center and check in at the registration area.   Wear comfortable clothing and clothing appropriate for easy access to any Portacath or PICC line.   We strive to give you quality time with your provider. You may need to reschedule your appointment if you arrive late (15 or more minutes).  Arriving late affects you and other patients whose appointments are after yours.  Also, if you miss three or more appointments without notifying the office, you may be dismissed from the clinic at the provider's discretion.      For prescription refill requests, have your pharmacy contact our office and allow 72 hours for refills to be completed.    Today you received the following chemotherapy and/or immunotherapy agents: pembrolizumab, pemetrexed, and carboplatin.      To help prevent nausea and vomiting after your treatment, we encourage you to take your nausea medication as directed.  BELOW ARE SYMPTOMS THAT SHOULD BE REPORTED IMMEDIATELY: *FEVER GREATER THAN 100.4 F (38 C) OR HIGHER *CHILLS OR SWEATING *NAUSEA AND VOMITING THAT IS NOT CONTROLLED WITH YOUR NAUSEA MEDICATION *UNUSUAL SHORTNESS OF BREATH *UNUSUAL BRUISING OR BLEEDING *URINARY PROBLEMS (pain or burning when urinating, or frequent urination) *BOWEL PROBLEMS (unusual diarrhea, constipation, pain near the anus) TENDERNESS IN MOUTH AND THROAT WITH OR WITHOUT PRESENCE OF ULCERS (sore throat, sores in mouth, or a toothache) UNUSUAL RASH, SWELLING OR PAIN  UNUSUAL VAGINAL DISCHARGE OR ITCHING   Items with * indicate a potential emergency and should be followed up as soon as possible or go to the Emergency Department if any problems should occur.  Please show the CHEMOTHERAPY ALERT CARD or  IMMUNOTHERAPY ALERT CARD at check-in to the Emergency Department and triage nurse.  Should you have questions after your visit or need to cancel or reschedule your appointment, please contact [facility]  Dept: [phone]  and follow the prompts.  Office hours are 8:00 a.m. to 4:30 p.m. Monday - Friday. Please note that voicemails left after 4:00 p.m. may not be returned until the following business day.  We are closed weekends and major holidays. You have access to a nurse at all times for urgent questions. Please call the main number to the clinic Dept: [phone] and follow the prompts.   For any non-urgent questions, you may also contact your provider using MyChart. We now offer e-Visits for anyone 18 and older to request care online for non-urgent symptoms. For details visit [email or web address].   Also download the MyChart app! Go to the app store, search "MyChart", open the app, select , and log in with your MyChart username and password.  Due to Covid, a mask is required upon entering the hospital/clinic. If you do not have a mask, one will be given to you upon arrival. For doctor visits, patients may have 1 support person aged 18 or older with them. For treatment visits, patients cannot have anyone with them due to current Covid guidelines and our immunocompromised population.   

## 2021-06-21 ENCOUNTER — Other Ambulatory Visit (HOSPITAL_COMMUNITY): Payer: Self-pay

## 2021-06-22 ENCOUNTER — Encounter (HOSPITAL_COMMUNITY)
Admission: RE | Admit: 2021-06-22 | Discharge: 2021-06-22 | Disposition: A | Discharge status: Home / Self Care | Payer: 59 | Source: Ambulatory Visit | Attending: "Endocrinology | Admitting: "Endocrinology

## 2021-06-22 ENCOUNTER — Other Ambulatory Visit: Payer: Self-pay

## 2021-06-22 DIAGNOSIS — E230 Hypopituitarism: Secondary | ICD-10-CM | POA: Insufficient documentation

## 2021-06-22 LAB — ACTH STIMULATION, 3 TIME POINTS
Cortisol, 30 Min: 9.8 ug/dL
Cortisol, 60 Min: 14.4 ug/dL
Cortisol, Base: 4.5 ug/dL

## 2021-06-22 MED ORDER — COSYNTROPIN 0.25 MG IJ SOLR
0.2500 mg | Freq: Once | INTRAMUSCULAR | Status: AC
  Administered 2021-06-22: 0.25 mg via INTRAVENOUS
  Filled 2021-06-22: qty 0.25

## 2021-06-23 ENCOUNTER — Other Ambulatory Visit (HOSPITAL_COMMUNITY): Payer: Self-pay

## 2021-06-24 ENCOUNTER — Other Ambulatory Visit (HOSPITAL_COMMUNITY): Payer: Self-pay

## 2021-06-26 ENCOUNTER — Other Ambulatory Visit: Payer: Self-pay

## 2021-06-26 ENCOUNTER — Other Ambulatory Visit: Payer: 59

## 2021-06-26 ENCOUNTER — Inpatient Hospital Stay: Payer: 59

## 2021-06-26 DIAGNOSIS — C7931 Secondary malignant neoplasm of brain: Secondary | ICD-10-CM | POA: Diagnosis not present

## 2021-06-26 DIAGNOSIS — C3491 Malignant neoplasm of unspecified part of right bronchus or lung: Secondary | ICD-10-CM

## 2021-06-26 DIAGNOSIS — C7951 Secondary malignant neoplasm of bone: Secondary | ICD-10-CM | POA: Diagnosis not present

## 2021-06-26 DIAGNOSIS — Z5111 Encounter for antineoplastic chemotherapy: Secondary | ICD-10-CM | POA: Diagnosis not present

## 2021-06-26 DIAGNOSIS — Z5112 Encounter for antineoplastic immunotherapy: Secondary | ICD-10-CM | POA: Diagnosis not present

## 2021-06-26 DIAGNOSIS — C3411 Malignant neoplasm of upper lobe, right bronchus or lung: Secondary | ICD-10-CM | POA: Diagnosis not present

## 2021-06-26 LAB — CBC WITH DIFFERENTIAL (CANCER CENTER ONLY)
Abs Immature Granulocytes: 0.05 10*3/uL (ref 0.00–0.07)
Basophils Absolute: 0 10*3/uL (ref 0.0–0.1)
Basophils Relative: 1 %
Eosinophils Absolute: 0 10*3/uL (ref 0.0–0.5)
Eosinophils Relative: 1 %
HCT: 35.8 % — ABNORMAL LOW (ref 39.0–52.0)
Hemoglobin: 11.8 g/dL — ABNORMAL LOW (ref 13.0–17.0)
Immature Granulocytes: 1 %
Lymphocytes Relative: 38 %
Lymphs Abs: 2.2 10*3/uL (ref 0.7–4.0)
MCH: 31.5 pg (ref 26.0–34.0)
MCHC: 33 g/dL (ref 30.0–36.0)
MCV: 95.5 fL (ref 80.0–100.0)
Monocytes Absolute: 0.5 10*3/uL (ref 0.1–1.0)
Monocytes Relative: 8 %
Neutro Abs: 3 10*3/uL (ref 1.7–7.7)
Neutrophils Relative %: 51 %
Platelet Count: 169 10*3/uL (ref 150–400)
RBC: 3.75 MIL/uL — ABNORMAL LOW (ref 4.22–5.81)
RDW: 18 % — ABNORMAL HIGH (ref 11.5–15.5)
WBC Count: 5.8 10*3/uL (ref 4.0–10.5)
nRBC: 0 % (ref 0.0–0.2)

## 2021-06-26 LAB — CMP (CANCER CENTER ONLY)
ALT: 38 U/L (ref 0–44)
AST: 20 U/L (ref 15–41)
Albumin: 3.5 g/dL (ref 3.5–5.0)
Alkaline Phosphatase: 86 U/L (ref 38–126)
Anion gap: 11 (ref 5–15)
BUN: 14 mg/dL (ref 8–23)
CO2: 26 mmol/L (ref 22–32)
Calcium: 9.2 mg/dL (ref 8.9–10.3)
Chloride: 105 mmol/L (ref 98–111)
Creatinine: 0.94 mg/dL (ref 0.61–1.24)
GFR, Estimated: >60 mL/min (ref >60)
Glucose, Bld: 226 mg/dL — ABNORMAL HIGH (ref 70–99)
Potassium: 3.5 mmol/L (ref 3.5–5.1)
Sodium: 142 mmol/L (ref 135–145)
Total Bilirubin: <0.2 mg/dL — ABNORMAL LOW (ref 0.3–1.2)
Total Protein: 6.3 g/dL — ABNORMAL LOW (ref 6.5–8.1)

## 2021-06-27 ENCOUNTER — Other Ambulatory Visit (HOSPITAL_COMMUNITY): Payer: Self-pay

## 2021-06-27 ENCOUNTER — Ambulatory Visit: Payer: 59 | Admitting: "Endocrinology

## 2021-06-27 ENCOUNTER — Encounter: Payer: Self-pay | Admitting: "Endocrinology

## 2021-06-27 VITALS — BP 122/84 | HR 120 | Ht 72.0 in | Wt 171.8 lb

## 2021-06-27 DIAGNOSIS — E039 Hypothyroidism, unspecified: Secondary | ICD-10-CM | POA: Diagnosis not present

## 2021-06-27 DIAGNOSIS — E230 Hypopituitarism: Secondary | ICD-10-CM

## 2021-06-27 DIAGNOSIS — E291 Testicular hypofunction: Secondary | ICD-10-CM | POA: Diagnosis not present

## 2021-06-27 DIAGNOSIS — E2740 Unspecified adrenocortical insufficiency: Secondary | ICD-10-CM | POA: Diagnosis not present

## 2021-06-27 MED ORDER — DEXAMETHASONE 1 MG PO TABS
2.0000 mg | ORAL_TABLET | Freq: Every day | ORAL | 1 refills | Status: DC
  Filled 2021-06-27: qty 180, 90d supply, fill #0
  Filled 2021-10-13: qty 60, 30d supply, fill #1
  Filled 2021-11-11: qty 60, 30d supply, fill #2
  Filled 2021-12-13: qty 60, 30d supply, fill #3

## 2021-06-27 NOTE — Progress Notes (Signed)
Endocrinology follow-up note                                             06/27/2021, 5:44 PM   Subjective:    Patient ID: Bradley Goodman, male    DOB: Jun 10, 1956, PCP [name], FNP   Past Medical History:  Diagnosis Date   Cervical spondylolysis    Essential hypertension    GERD (gastroesophageal reflux disease)    History of kidney stones    History of migraine    Hyperlipidemia    Hypertension    PONV (postoperative nausea and vomiting)    Type 2 diabetes mellitus ([place])    Past Surgical History:  Procedure Laterality Date   Bilateral inguinal hernia repair     BRONCHIAL NEEDLE ASPIRATION BIOPSY  04/05/2021   Procedure: BRONCHIAL NEEDLE ASPIRATION BIOPSIES;  Surgeon: Garner Nash, DO;  Location: [facility];  Service: Pulmonary;;   COLONOSCOPY  01/23/2012   Procedure: COLONOSCOPY;  Surgeon: Daneil Dolin, MD;  [facility];  Service: Endoscopy;  Laterality: N/A;  9:30 AM   COLONOSCOPY N/A 10/11/2015   Procedure: COLONOSCOPY;  Surgeon: Daneil Dolin, MD;  [facility];  Service: Endoscopy;  Laterality: N/A;  830   COLONOSCOPY N/A 10/14/2019   Procedure: COLONOSCOPY;  Surgeon: Daneil Dolin, MD;  [facility];  Service: Endoscopy;  Laterality: N/A;  1:45   POLYPECTOMY  10/14/2019   Procedure: POLYPECTOMY;  Surgeon: Daneil Dolin, MD;  [facility];  Service: Endoscopy;;  ascending colon, descending colon   VIDEO BRONCHOSCOPY WITH ENDOBRONCHIAL ULTRASOUND N/A 04/05/2021   Procedure: VIDEO BRONCHOSCOPY WITH ENDOBRONCHIAL ULTRASOUND;  Surgeon: Garner Nash, DO;  Location: [facility];  Service: Pulmonary;  Laterality: N/A;   Social History   Socioeconomic History   Marital status: Married    Spouse name: Not on file   Number of children: Not on file   Years of education: Not on file   Highest education level: Not on file  Occupational History   Not on file  Tobacco Use   Smoking status: Every Day     Packs/day: 0.50    Years: 30.00    Pack years: 15.00    Types: Cigarettes    Start date: 10/30/1973   Smokeless tobacco: Never  Vaping Use   Vaping Use: Never used  Substance and Sexual Activity   Alcohol use: Yes    Alcohol/week: 0.0 standard drinks    Comment: One drink every 6 months.   Drug use: No   Sexual activity: Yes  Other Topics Concern   Not on file  Social History Narrative   Not on file   Social Determinants of Health   Financial Resource Strain: Not on file  Food Insecurity: Not on file  Transportation Needs: Not on file  Physical Activity: Not on file  Stress: Not on file  Social Connections: Not on file   Family History  Problem Relation Age of Onset   Hypertension Mother    Diabetes Mother    Heart attack Mother    Hypertension Father    Heart attack Father    Heart attack Brother    Colon cancer Neg Hx    Outpatient Encounter Medications as of 06/27/2021  Medication Sig   docusate sodium (COLACE) 100 MG  capsule Take 100 mg by mouth daily.   cholecalciferol (VITAMIN D) 1000 UNITS tablet Take 1,000 Units by mouth every evening.   dexamethasone (DECADRON) 1 MG tablet Take 2 tablets (2 mg total) by mouth daily.   folic acid (FOLVITE) 1 MG tablet Take 1 tablet (1 mg total) by mouth daily.   Krill Oil 300 MG CAPS Take 300 mg by mouth every evening.   levothyroxine (SYNTHROID) 25 MCG tablet Take 1 tablet (25 mcg total) by mouth daily before breakfast.   loratadine (CLARITIN) 10 MG tablet Take 10 mg by mouth every evening.    losartan (COZAAR) 50 MG tablet TAKE 1 AND 1/2 TABLETS BY MOUTH DAILY   magic mouthwash (nystatin, diphenhydrAMINE, alum & mag hydroxide) suspension mixture Swish and spit 5 mls up to 4 times daily as needed. (Patient not taking: Reported on 06/19/2021)   mirtazapine (REMERON) 15 MG tablet Take 1 tablet (15 mg total) by mouth at bedtime.   Multiple Vitamins-Minerals (MULTIVITAMIN GUMMIES ADULT PO) Take 2 each by mouth daily.    pantoprazole (PROTONIX) 40 MG tablet Take 1 tablet by mouth every evening.   prochlorperazine (COMPAZINE) 10 MG tablet Take 1 tablet (10 mg total) by mouth every 6 (six) hours as needed for nausea or vomiting. (Patient not taking: Reported on 06/19/2021)   rosuvastatin (CRESTOR) 20 MG tablet Take 1 tablet (20 mg total) by mouth daily.   SYRINGE-NEEDLE, DISP, 3 ML 21G X 1-1/2" 3 ML MISC Use to inject testosterone every week   testosterone cypionate (DEPO-TESTOSTERONE) 100 MG/ML injection Inject 0.5 mLs (50 mg total) into the muscle every 7 (seven) days. For IM use only   venlafaxine XR (EFFEXOR-XR) 75 MG 24 hr capsule Take 1 capsule (75 mg total) by mouth daily with breakfast.   [DISCONTINUED] dexamethasone (DECADRON) 1 MG tablet Take 2 tablets (2 mg total) by mouth daily. (Patient taking differently: Take 1 mg by mouth daily.)   No facility-administered encounter medications on file as of 06/27/2021.   ALLERGIES: No Known Allergies  VACCINATION STATUS: Immunization History  Administered Date(s) Administered   Hepatitis B 02/06/1990, 10/28/1990, 04/02/1991   Influenza,inj,Quad PF,6+ Mos 07/07/2019   Influenza-Unspecified 07/25/2018   Moderna Sars-Covid-2 Vaccination 12/01/2019, 12/30/2019, 10/19/2020   Pneumococcal Conjugate-13 03/11/2017   Tdap 01/17/2016    HPI Bradley Goodman is 65 y.o. male who presents today with a medical history as above. he is being seen in follow-up after he was seen in consultation for panhypopituitarism requested by [name], FNP.  History is obtained directly from the patient as well as chart review. He is accompanied by his wife.  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  He is status post radiosurgery of CNS metastasis and chemotherapy.  His chemotherapy in the involved high-dose steroids in the form of dexamethasone, currently on dexamethasone 1 mg p.o. daily. During this process, he developed  fatigue, low libido, and loss of skeletal muscle mass.  His labs showed hypogonadism, partial adrenal insufficiency, and hypothyroidism. -During his last visit, he was initiated on testosterone 50 mg subcutaneously every 7 days, did not start this intervention yet.  He is benefiting from low-dose levothyroxine 25 mcg p.o. daily. He denies prior testicular injury.  He denies testicular radiation . He wishes to be treated with testosterone and  for other hormone deficiencies as necessary.    Review of Systems  Constitutional: + Stable weight,   + fatigue, + subjective hypothermia, + low libido Eyes:  no blurry vision, no xerophthalmia ENT: no sore throat, no nodules palpated in throat, no dysphagia/odynophagia, no hoarseness Cardiovascular: no Chest Pain, no Shortness of Breath, no palpitations, no leg swelling Respiratory: no cough, no shortness of breath Gastrointestinal: no Nausea/Vomiting/Diarhhea Musculoskeletal: no muscle/joint aches Skin: no rashes Neurological: no tremors, no numbness, no tingling, no dizziness Psychiatric: no depression, no anxiety  Objective:    Vitals with BMI 06/27/2021 06/22/2021 06/19/2021  Height 6\' 0"  6\' 0"  6\' 0"   Weight 171 lbs 13 oz 168 lbs 8 oz 168 lbs 8 oz  BMI 23.3 71.06 26.94  Systolic 854 627 035  Diastolic 84 88 85  Pulse 009 100 103    BP 122/84   Pulse (!) 120   Ht 6' (1.829 m)   Wt 171 lb 12.8 oz (77.9 kg)   BMI 23.30 kg/m   Wt Readings from Last 3 Encounters:  06/27/21 171 lb 12.8 oz (77.9 kg)  06/22/21 168 lb 8 oz (76.4 kg)  06/19/21 168 lb 8 oz (76.4 kg)    Physical Exam  Constitutional:  Body mass index is 23.3 kg/m.,  not in acute distress, normal state of mind Eyes: PERRLA, EOMI, no exophthalmos ENT: moist mucous membranes, no gross thyromegaly, no gross cervical lymphadenopathy   CMP ( most recent) CMP     Component Value Date/Time   NA 142 06/26/2021 1105   NA 144 01/10/2021 0849   K 3.5 06/26/2021 1105   CL 105  06/26/2021 1105   CO2 26 06/26/2021 1105   GLUCOSE 226 (H) 06/26/2021 1105   BUN 14 06/26/2021 1105   BUN 15 01/10/2021 0849   CREATININE 0.94 06/26/2021 1105   CALCIUM 9.2 06/26/2021 1105   PROT 6.3 (L) 06/26/2021 1105   PROT 6.3 01/10/2021 0849   ALBUMIN 3.5 06/26/2021 1105   ALBUMIN 4.2 01/10/2021 0849   AST 20 06/26/2021 1105   ALT 38 06/26/2021 1105   ALKPHOS 86 06/26/2021 1105   BILITOT <0.2 (L) 06/26/2021 1105   GFRNONAA >60 06/26/2021 1105   GFRAA 82 07/14/2020 0945     Diabetic Labs (most recent): Lab Results  Component Value Date   HGBA1C 6.2 01/10/2021   HGBA1C 5.9 07/14/2020   HGBA1C 5.9 03/09/2020     Lipid Panel ( most recent) Lipid Panel     Component Value Date/Time   CHOL 95 (L) 01/10/2021 0849   TRIG 180 (H) 01/10/2021 0849   TRIG 87 10/18/2016 0851   HDL 33 (L) 01/10/2021 0849   HDL 37 (L) 10/18/2016 0851   CHOLHDL 2.9 01/10/2021 0849   LDLCALC 33 01/10/2021 0849   LDLCALC 53 07/13/2014 0925   LABVLDL 29 01/10/2021 0849      Lab Results  Component Value Date   TSH 1.319 06/13/2021   TSH 0.400 05/23/2021   TSH 0.489 05/10/2021   TSH 0.849 05/08/2021   TSH 2.900 11/09/2019   TSH 2.110 09/18/2018    Results for SYON, TEWS (MRN [id number]) as of 06/06/2021 16:47  Ref. Range 05/10/2021 08:15 05/15/2021 10:11 05/23/2021 14:26  Cortisol, Plasma Latest Units: ug/dL 1.8    LH Latest Ref Range: 1.7 - 8.6 mIU/mL <0.3 (L)    FSH Latest Ref Range: 1.5 - 12.4 mIU/mL 0.4 (L)    Prolactin Latest Ref Range: 4.0 - 15.2 ng/mL 27.0 (H)    Somatomedin C Latest Ref Range: 64 - 240 ng/mL 115    Glucose Latest Ref Range: 70 - 99 mg/dL  95 200 (H)  Glucose, Fasting  Latest Ref Range: 70 - 99 mg/dL 97    Testosterone Latest Ref Range: 264 - 916 ng/dL <3 (L)    TSH Latest Ref Range: 0.320 - 4.118 uIU/mL 0.489  0.400  Thyroxine (T4) Latest Ref Range: 4.5 - 12.0 ug/dL 5.5           Assessment & Plan:   1. Hypopituitarism  2.  Hypogonadism 3.hypothyroidism  4.partial adrenal insufficiency -He is accompanied by his wife to clinic.  I discussed his recent findings and previous lab work with him.  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 -Will benefit from continued Decadron intervention.  I discussed and increase his Decadron to 2 mg p.o. daily.  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  He would benefit from continued intervention with levothyroxine 25 mcg p.o. daily before breakfast.  - We discussed about the correct intake of his thyroid hormone, on empty stomach at fasting, with water, separated by at least 30 minutes from breakfast and other medications,  and separated by more than 4 hours from calcium, iron, multivitamins, acid reflux medications (PPIs). -Patient is made aware of the fact that thyroid hormone replacement is needed for life, dose to be adjusted by periodic monitoring of thyroid function tests.  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  He received his prescription, did not start it yet.  He will start tomorrow morning with 50 mg of testosterone IM every 7 days with plan to repeat labs in 3 months.    -Treatment target for him will be between 250-350 ng per DL, with the aim being to help him with libido as well as possibly avoid further loss of skeletal muscle mass.  - he is advised to maintain close follow up with his oncologist, neurologist, radiologist and PCP [name], FNP for primary care needs.   I spent 31 minutes in the care of the patient today including review of labs from Thyroid Function, CMP, and other relevant labs ; imaging/biopsy records (current and previous including abstractions from other facilities); face-to-face time discussing  his lab results and symptoms, medications doses, his  options of short and long term treatment based on the latest standards of care / guidelines;   and documenting the encounter.  Ernesta Amble Schlag  participated in the discussions, expressed understanding, and voiced agreement with the above plans.  All questions were answered to his satisfaction. he is encouraged to contact clinic should he have any questions or concerns prior to his return visit.  Follow up plan: Return in about 3 months (around 09/26/2021) for Fasting Labs  in AM B4 8.   Glade Lloyd, MD [facility] [address] [place],  [address] Phone: [phone]  Fax: [phone]     06/27/2021, 5:44 PM  This note was partially dictated with voice recognition software. Similar sounding words can be transcribed inadequately or may not  be corrected upon review.

## 2021-06-28 ENCOUNTER — Other Ambulatory Visit (HOSPITAL_COMMUNITY): Payer: Self-pay

## 2021-06-30 ENCOUNTER — Other Ambulatory Visit (HOSPITAL_COMMUNITY): Payer: Self-pay

## 2021-07-01 ENCOUNTER — Other Ambulatory Visit (HOSPITAL_COMMUNITY): Payer: Self-pay

## 2021-07-03 ENCOUNTER — Inpatient Hospital Stay: Payer: 59

## 2021-07-03 ENCOUNTER — Other Ambulatory Visit: Payer: Self-pay

## 2021-07-03 ENCOUNTER — Other Ambulatory Visit (HOSPITAL_COMMUNITY): Payer: Self-pay

## 2021-07-03 DIAGNOSIS — Z5112 Encounter for antineoplastic immunotherapy: Secondary | ICD-10-CM | POA: Diagnosis not present

## 2021-07-03 DIAGNOSIS — C7951 Secondary malignant neoplasm of bone: Secondary | ICD-10-CM | POA: Diagnosis not present

## 2021-07-03 DIAGNOSIS — Z5111 Encounter for antineoplastic chemotherapy: Secondary | ICD-10-CM | POA: Diagnosis not present

## 2021-07-03 DIAGNOSIS — C7931 Secondary malignant neoplasm of brain: Secondary | ICD-10-CM | POA: Diagnosis not present

## 2021-07-03 DIAGNOSIS — C3491 Malignant neoplasm of unspecified part of right bronchus or lung: Secondary | ICD-10-CM

## 2021-07-03 DIAGNOSIS — C3411 Malignant neoplasm of upper lobe, right bronchus or lung: Secondary | ICD-10-CM | POA: Diagnosis not present

## 2021-07-03 LAB — CBC WITH DIFFERENTIAL (CANCER CENTER ONLY)
Abs Immature Granulocytes: 0.11 10*3/uL — ABNORMAL HIGH (ref 0.00–0.07)
Basophils Absolute: 0 10*3/uL (ref 0.0–0.1)
Basophils Relative: 0 %
Eosinophils Absolute: 0 10*3/uL (ref 0.0–0.5)
Eosinophils Relative: 0 %
HCT: 36.5 % — ABNORMAL LOW (ref 39.0–52.0)
Hemoglobin: 12.1 g/dL — ABNORMAL LOW (ref 13.0–17.0)
Immature Granulocytes: 1 %
Lymphocytes Relative: 16 %
Lymphs Abs: 1.3 10*3/uL (ref 0.7–4.0)
MCH: 31.7 pg (ref 26.0–34.0)
MCHC: 33.2 g/dL (ref 30.0–36.0)
MCV: 95.5 fL (ref 80.0–100.0)
Monocytes Absolute: 0.4 10*3/uL (ref 0.1–1.0)
Monocytes Relative: 4 %
Neutro Abs: 6.4 10*3/uL (ref 1.7–7.7)
Neutrophils Relative %: 79 %
Platelet Count: 150 10*3/uL (ref 150–400)
RBC: 3.82 MIL/uL — ABNORMAL LOW (ref 4.22–5.81)
RDW: 19.8 % — ABNORMAL HIGH (ref 11.5–15.5)
WBC Count: 8.1 10*3/uL (ref 4.0–10.5)
nRBC: 0 % (ref 0.0–0.2)

## 2021-07-03 LAB — CMP (CANCER CENTER ONLY)
ALT: 35 U/L (ref 0–44)
AST: 20 U/L (ref 15–41)
Albumin: 3.7 g/dL (ref 3.5–5.0)
Alkaline Phosphatase: 86 U/L (ref 38–126)
Anion gap: 10 (ref 5–15)
BUN: 14 mg/dL (ref 8–23)
CO2: 25 mmol/L (ref 22–32)
Calcium: 9.1 mg/dL (ref 8.9–10.3)
Chloride: 109 mmol/L (ref 98–111)
Creatinine: 0.86 mg/dL (ref 0.61–1.24)
GFR, Estimated: >60 mL/min (ref >60)
Glucose, Bld: 147 mg/dL — ABNORMAL HIGH (ref 70–99)
Potassium: 3.8 mmol/L (ref 3.5–5.1)
Sodium: 144 mmol/L (ref 135–145)
Total Bilirubin: <0.2 mg/dL — ABNORMAL LOW (ref 0.3–1.2)
Total Protein: 6.6 g/dL (ref 6.5–8.1)

## 2021-07-04 LAB — TSH: TSH: 0.615 u[IU]/mL (ref 0.320–4.118)

## 2021-07-05 ENCOUNTER — Other Ambulatory Visit: Payer: Self-pay

## 2021-07-05 ENCOUNTER — Ambulatory Visit (HOSPITAL_COMMUNITY)
Admission: RE | Admit: 2021-07-05 | Discharge: 2021-07-05 | Disposition: A | Discharge status: Home / Self Care | Payer: 59 | Source: Ambulatory Visit | Attending: Internal Medicine | Admitting: Internal Medicine

## 2021-07-05 ENCOUNTER — Encounter (HOSPITAL_COMMUNITY): Payer: Self-pay

## 2021-07-05 ENCOUNTER — Encounter: Payer: Self-pay | Admitting: Internal Medicine

## 2021-07-05 DIAGNOSIS — N200 Calculus of kidney: Secondary | ICD-10-CM | POA: Diagnosis not present

## 2021-07-05 DIAGNOSIS — C3490 Malignant neoplasm of unspecified part of unspecified bronchus or lung: Secondary | ICD-10-CM | POA: Diagnosis not present

## 2021-07-05 DIAGNOSIS — K769 Liver disease, unspecified: Secondary | ICD-10-CM | POA: Diagnosis not present

## 2021-07-05 DIAGNOSIS — R911 Solitary pulmonary nodule: Secondary | ICD-10-CM | POA: Diagnosis not present

## 2021-07-05 IMAGING — CT CT CHEST W/ CM
2 of 5 series · 12 of 36 positions shown, 15 images · IV contrast (APPLIED)
Comparison: PET-CT 622 PET-CT [DATE], CT [DATE]

CLINICAL DATA: Non-small cell lung cancer. Lung cancer diagnosed
[DATE]. Chemotherapy immunotherapy in progress. Radiation therapy
complete.

EXAM:
CT CHEST, ABDOMEN, AND PELVIS WITH CONTRAST
TECHNIQUE: Multidetector CT imaging of the chest, abdomen and pelvis was
performed following the standard protocol during bolus
administration of intravenous contrast.
CONTRAST:  80mL OMNIPAQUE IOHEXOL 350 MG/ML SOLN

[Series 2: cap with · axial · 0.73mm/px · z∈[-609,-79]mm · 9 of 134 slices shown, 12 images]
[im 14/134  mediastinal]
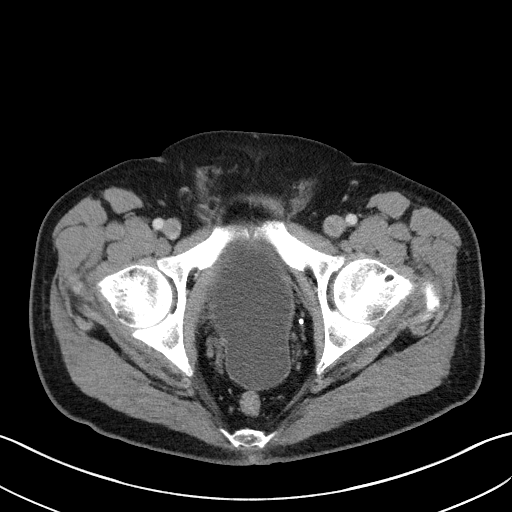
[im 14/134  lung]
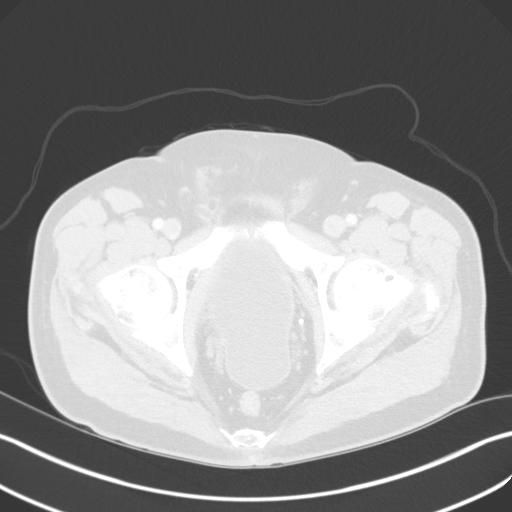
[im 27/134  lung]
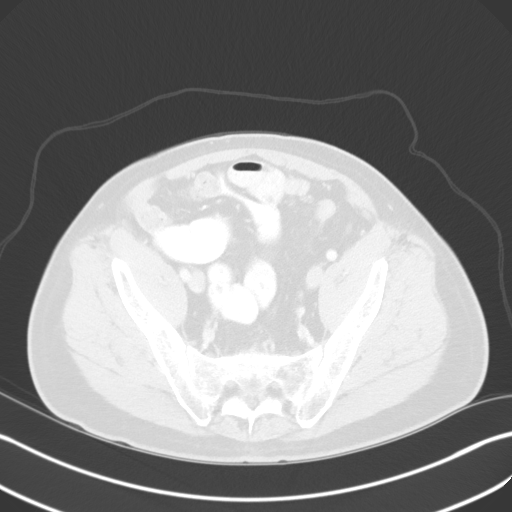
[im 40/134  lung]
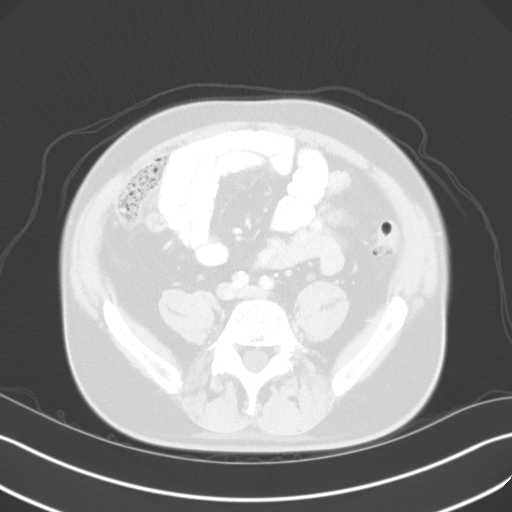
[im 54/134  lung]
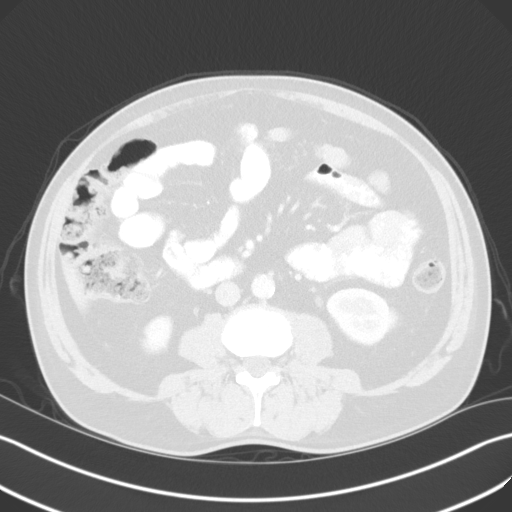
[im 67/134  mediastinal]
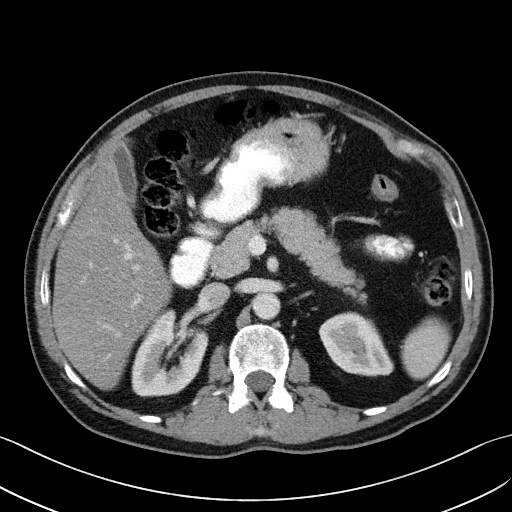
[im 67/134  lung]
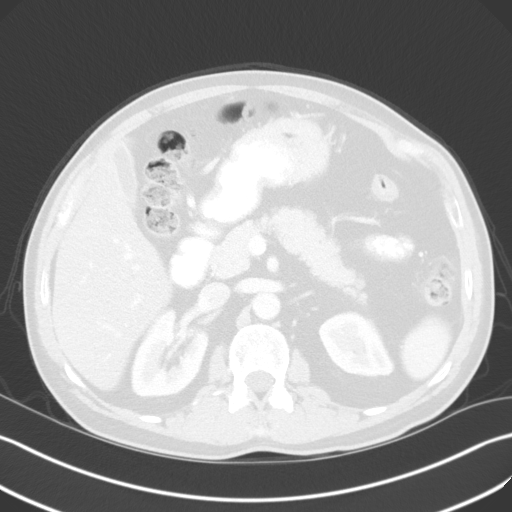
[im 80/134  lung]
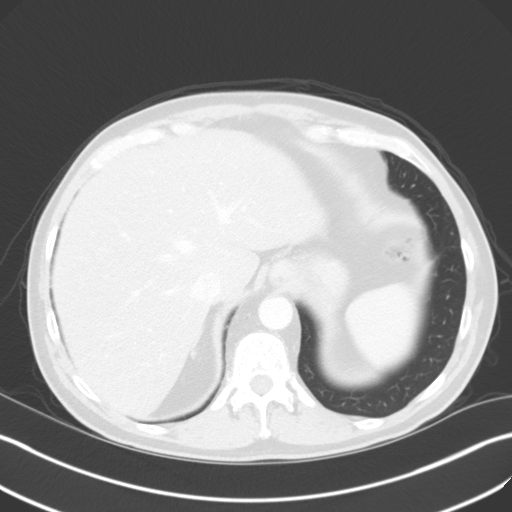
[im 94/134  lung]
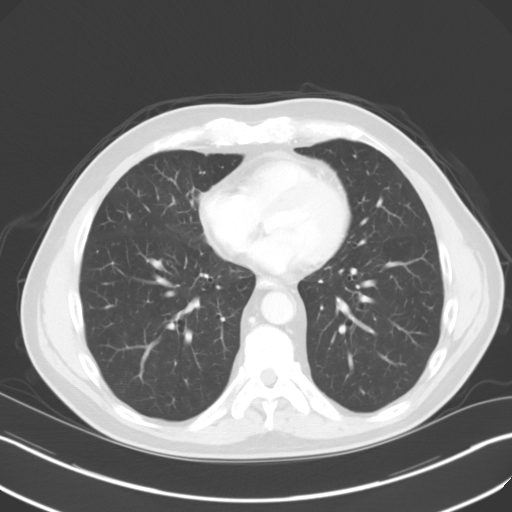
[im 107/134  lung]
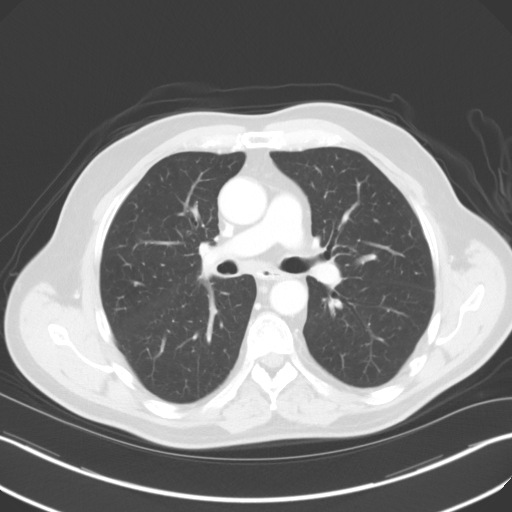
[im 120/134  mediastinal]
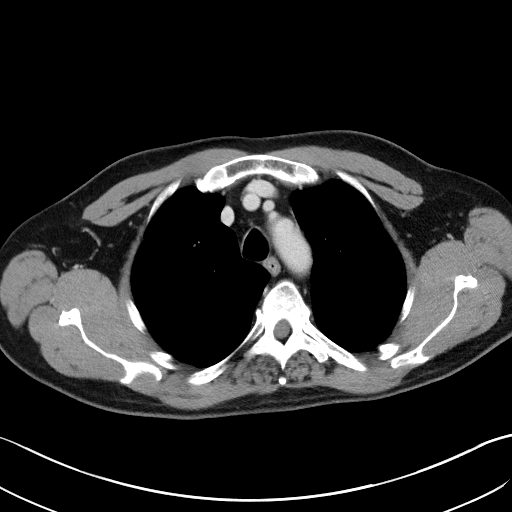
[im 120/134  lung]
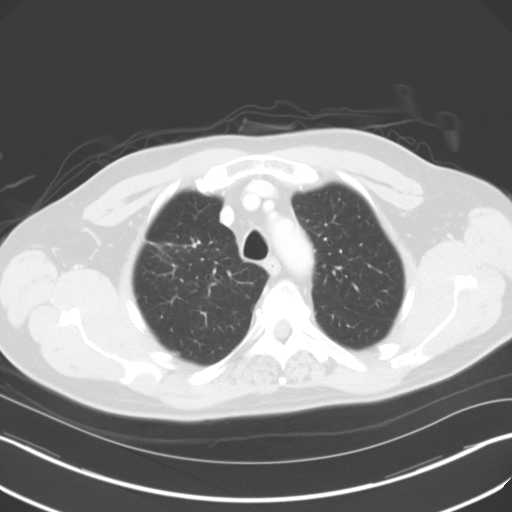

[Series 4: coronals · coronal · 0.80mm/px · 3 of 140 slices shown]
[im 28/140  lung]
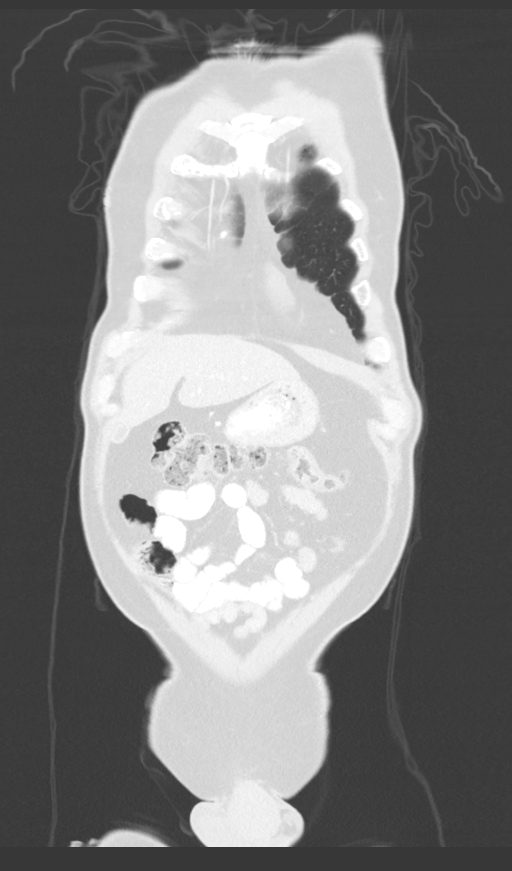
[im 56/140  lung]
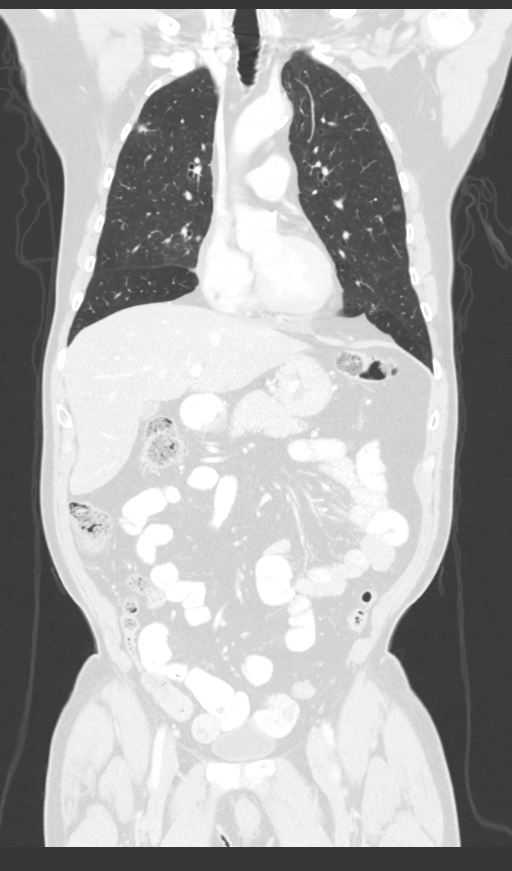
[im 84/140  lung]
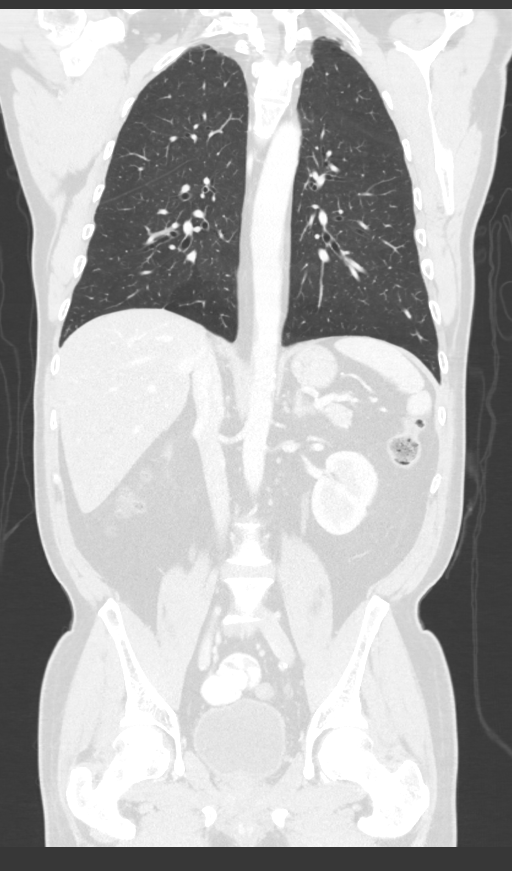

[12 of 36 positions shown; findings below may reference images not displayed]

FINDINGS: CT CHEST FINDINGS

Cardiovascular: No significant vascular findings. Normal heart size.
No pericardial effusion.

Mediastinum/Nodes: Interval decrease in size of subcarinal lymph
node measuring 8 mm short axis decreased form 20 mm. Small RIGHT
paratracheal lymph nodes are slightly reduced in size now measure
less than 5 mm.

Lungs/Pleura: Peripheral nodule in the RIGHT upper lobe which was
previously hypermetabolic is decreased in size measuring 7 mm (image
49/6) compared to 12 mm.

Peribronchial thickening in the RIGHT upper lobe is decreased.

SMall RIGHT upper lobe nodule measuring 4 mm (image 65/6) is
unchanged.

Peripheral nodule in the RIGHT posterior upper lobe measuring 4 mm
(image 42) is also unchanged. No new nodules are present

In LEFT upper lobe ground-glass nodule image 80/6 measuring 5 mm and
unchanged.

Musculoskeletal: No aggressive osseous lesion.

CT ABDOMEN AND PELVIS FINDINGS

Hepatobiliary: Hypodense lesion in the inferior RIGHT hepatic lobe
measuring 5 mm image 75/2) is not changed.

Pancreas: Pancreas is normal. No ductal dilatation. No pancreatic
inflammation.

Spleen: Adrenal glands normal. Nonobstructing calculus in the LEFT
kidney. Ureters and bladder normal

Adrenals/urinary tract: Adrenal glands and kidneys are normal. The
ureters and bladder normal.

Stomach/Bowel: Stomach, small bowel, appendix, and cecum are
normal. The colon and rectosigmoid colon are normal.

Vascular/Lymphatic: Abdominal aorta is normal caliber with
atherosclerotic calcification. There is no retroperitoneal or
periportal lymphadenopathy. No pelvic lymphadenopathy.

Reproductive: Prostate unremarkable

Other: No peritoneal disease

Musculoskeletal: No aggressive osseous lesion.
IMPRESSION: Chest Impression:

1. Decrease in size of subcarinal adenopathy and small paratracheal
nodes.
2. Decrease in size of previous hypermetabolic RIGHT upper lobe
pulmonary nodule.
3. No new pulmonary nodularity.

Abdomen / Pelvis Impression:

1. No evidence of metastatic disease in the abdomen pelvis.
2. No evidence of skeletal metastasis

## 2021-07-05 IMAGING — CT CT ABD-PELV W/ CM
2 of 5 series · 12 of 36 positions shown, 15 images · IV contrast (APPLIED)
Comparison: PET-CT 622 PET-CT [DATE], CT [DATE]

CLINICAL DATA: Non-small cell lung cancer. Lung cancer diagnosed
[DATE]. Chemotherapy immunotherapy in progress. Radiation therapy
complete.

EXAM:
CT CHEST, ABDOMEN, AND PELVIS WITH CONTRAST
TECHNIQUE: Multidetector CT imaging of the chest, abdomen and pelvis was
performed following the standard protocol during bolus
administration of intravenous contrast.
CONTRAST:  80mL OMNIPAQUE IOHEXOL 350 MG/ML SOLN

[Series 2: cap with · axial · 0.73mm/px · z∈[-609,-79]mm · 9 of 134 slices shown, 12 images]
[im 14/134  mediastinal]
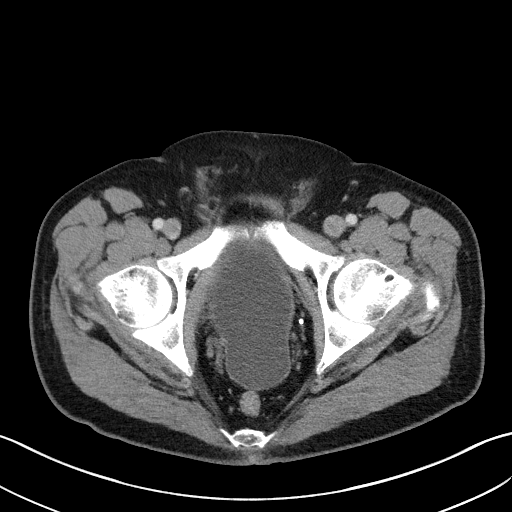
[im 14/134  lung]
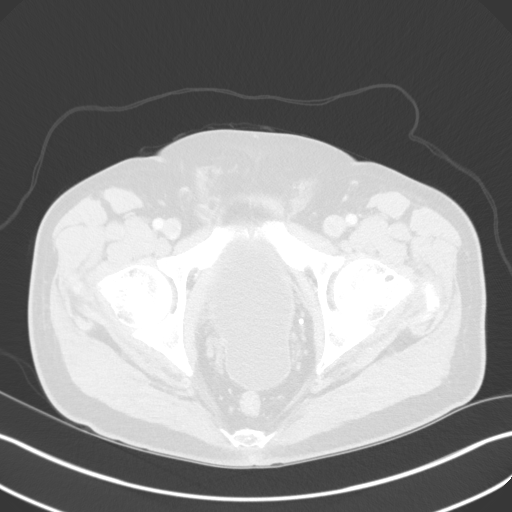
[im 27/134  lung]
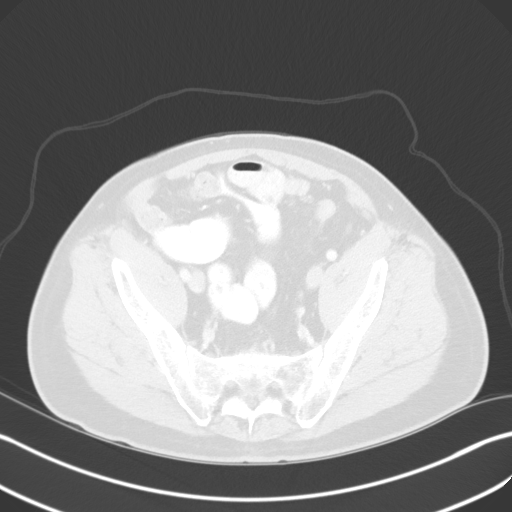
[im 40/134  lung]
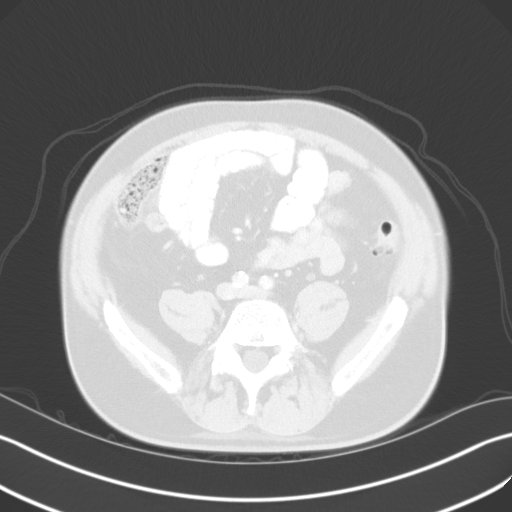
[im 54/134  lung]
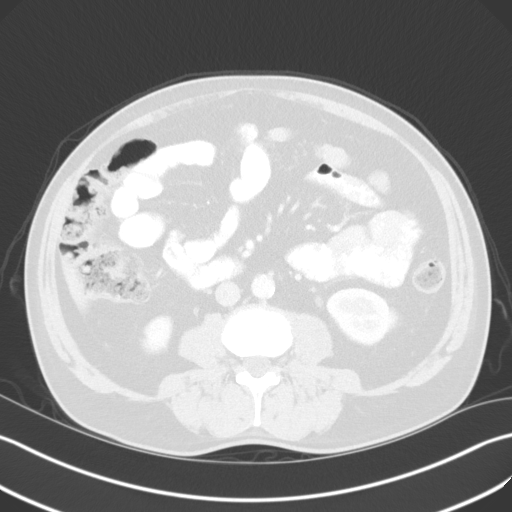
[im 67/134  mediastinal]
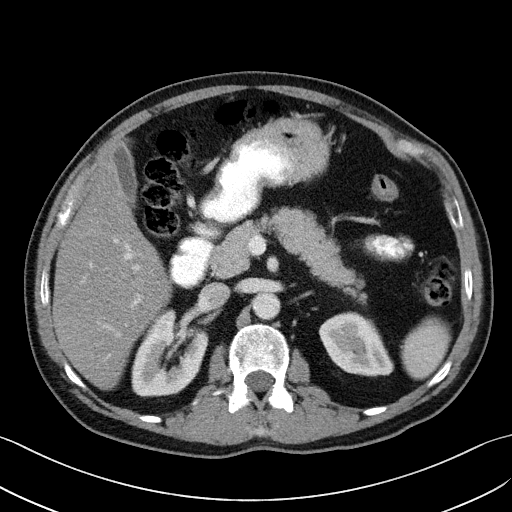
[im 67/134  lung]
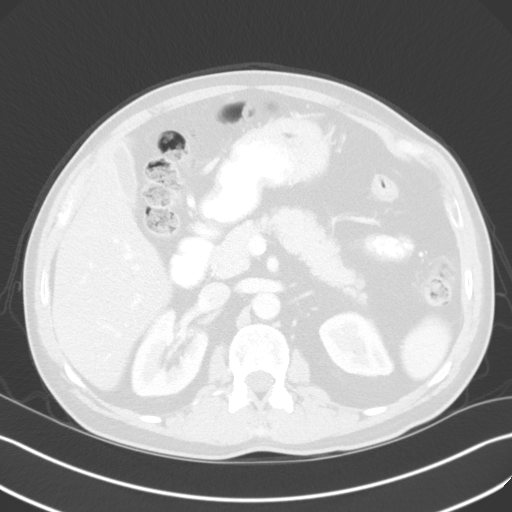
[im 80/134  lung]
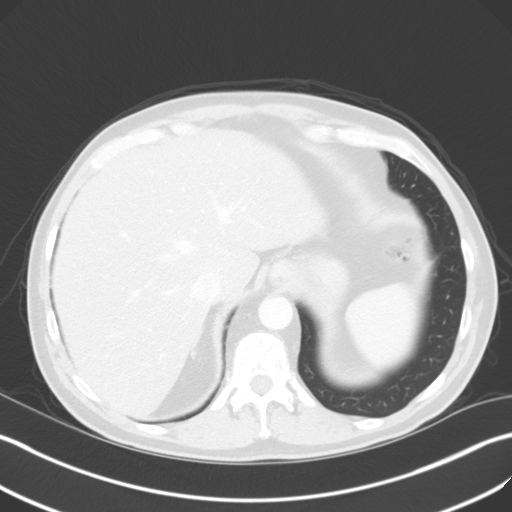
[im 94/134  lung]
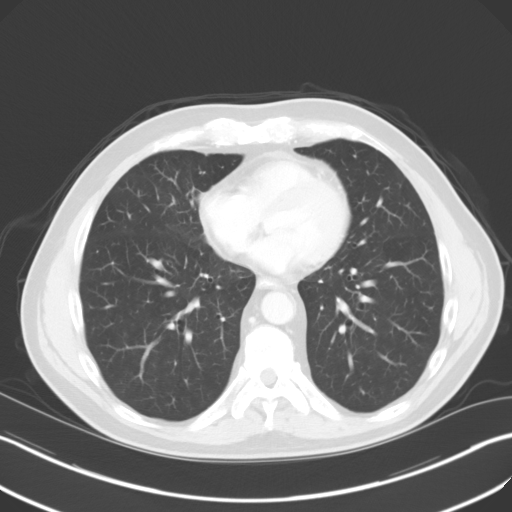
[im 107/134  lung]
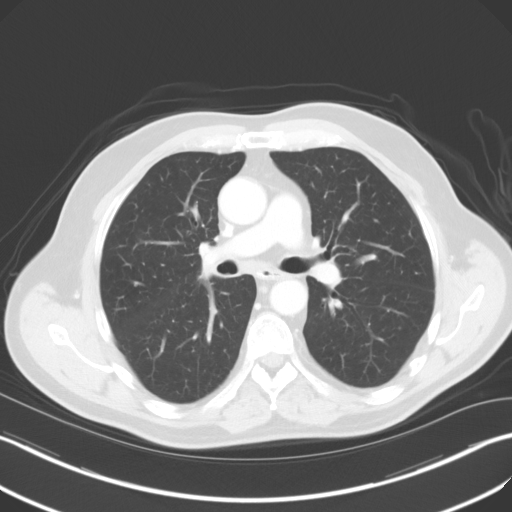
[im 120/134  mediastinal]
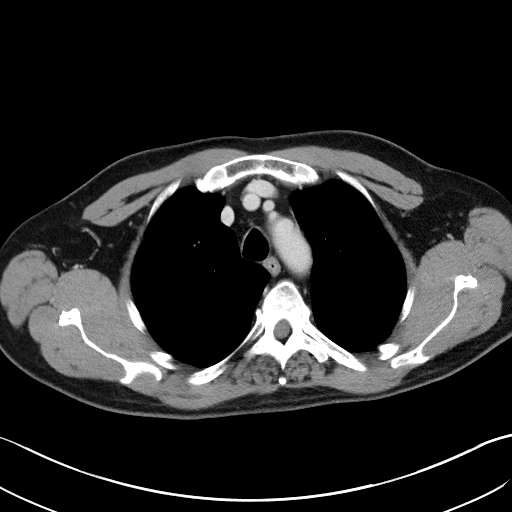
[im 120/134  lung]
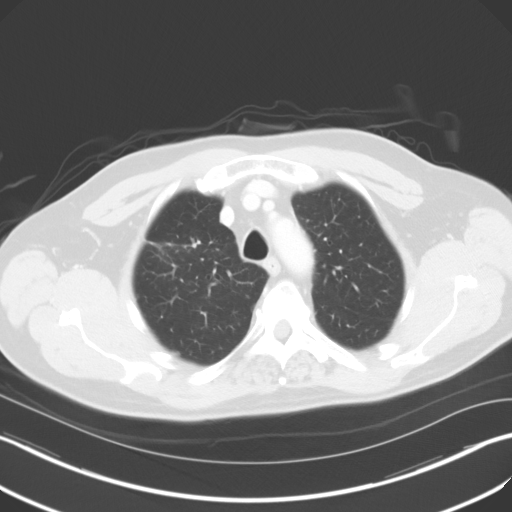

[Series 4: coronals · coronal · 0.80mm/px · 3 of 140 slices shown]
[im 28/140  lung]
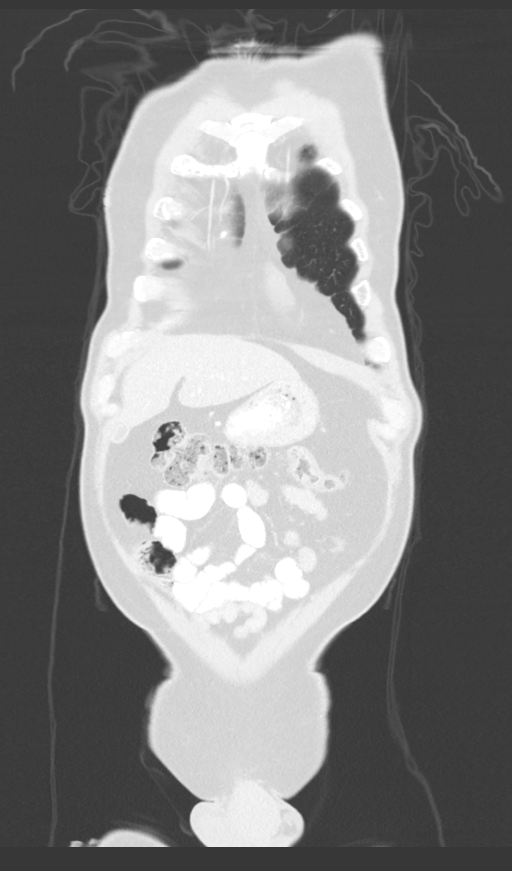
[im 56/140  lung]
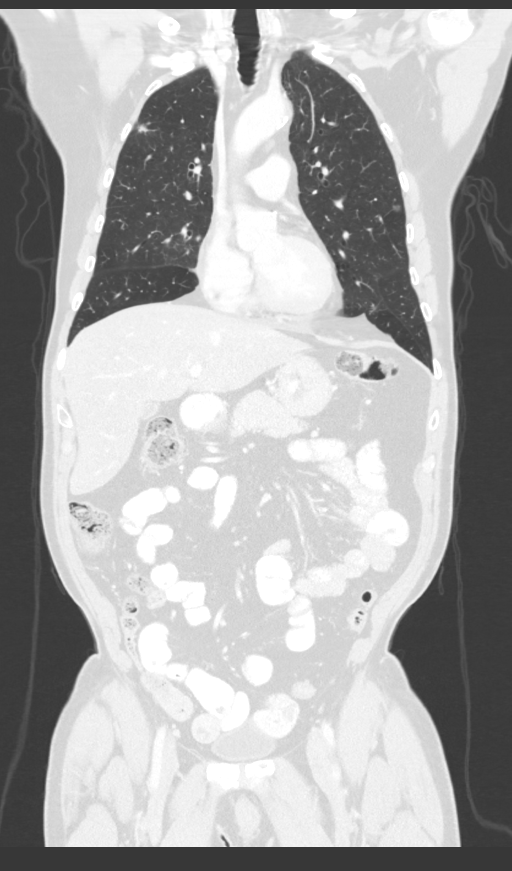
[im 84/140  lung]
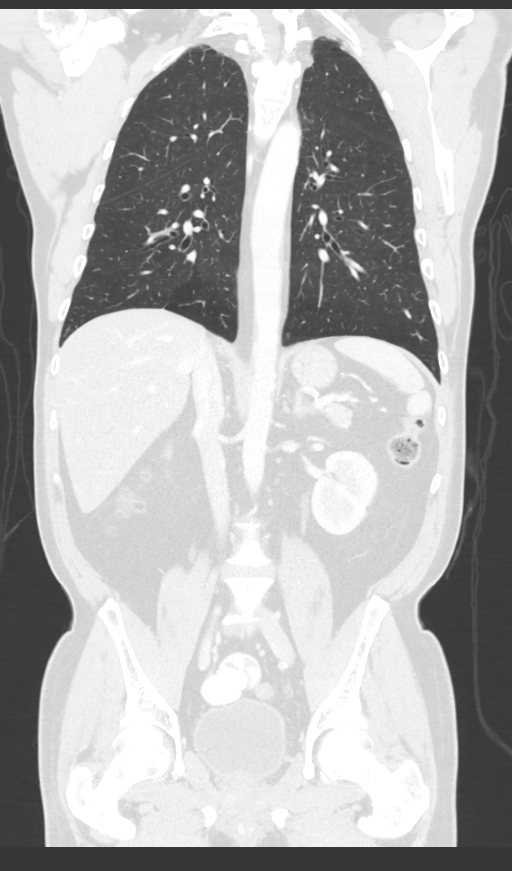

[12 of 36 positions shown; findings below may reference images not displayed]

FINDINGS: CT CHEST FINDINGS

Cardiovascular: No significant vascular findings. Normal heart size.
No pericardial effusion.

Mediastinum/Nodes: Interval decrease in size of subcarinal lymph
node measuring 8 mm short axis decreased form 20 mm. Small RIGHT
paratracheal lymph nodes are slightly reduced in size now measure
less than 5 mm.

Lungs/Pleura: Peripheral nodule in the RIGHT upper lobe which was
previously hypermetabolic is decreased in size measuring 7 mm (image
49/6) compared to 12 mm.

Peribronchial thickening in the RIGHT upper lobe is decreased.

SMall RIGHT upper lobe nodule measuring 4 mm (image 65/6) is
unchanged.

Peripheral nodule in the RIGHT posterior upper lobe measuring 4 mm
(image 42) is also unchanged. No new nodules are present

In LEFT upper lobe ground-glass nodule image 80/6 measuring 5 mm and
unchanged.

Musculoskeletal: No aggressive osseous lesion.

CT ABDOMEN AND PELVIS FINDINGS

Hepatobiliary: Hypodense lesion in the inferior RIGHT hepatic lobe
measuring 5 mm image 75/2) is not changed.

Pancreas: Pancreas is normal. No ductal dilatation. No pancreatic
inflammation.

Spleen: Adrenal glands normal. Nonobstructing calculus in the LEFT
kidney. Ureters and bladder normal

Adrenals/urinary tract: Adrenal glands and kidneys are normal. The
ureters and bladder normal.

Stomach/Bowel: Stomach, small bowel, appendix, and cecum are
normal. The colon and rectosigmoid colon are normal.

Vascular/Lymphatic: Abdominal aorta is normal caliber with
atherosclerotic calcification. There is no retroperitoneal or
periportal lymphadenopathy. No pelvic lymphadenopathy.

Reproductive: Prostate unremarkable

Other: No peritoneal disease

Musculoskeletal: No aggressive osseous lesion.
IMPRESSION: Chest Impression:

1. Decrease in size of subcarinal adenopathy and small paratracheal
nodes.
2. Decrease in size of previous hypermetabolic RIGHT upper lobe
pulmonary nodule.
3. No new pulmonary nodularity.

Abdomen / Pelvis Impression:

1. No evidence of metastatic disease in the abdomen pelvis.
2. No evidence of skeletal metastasis

## 2021-07-05 MED ORDER — IOHEXOL 350 MG/ML SOLN
80.0000 mL | Freq: Once | INTRAVENOUS | Status: AC | PRN
  Administered 2021-07-05: 80 mL via INTRAVENOUS

## 2021-07-10 ENCOUNTER — Other Ambulatory Visit: Payer: Self-pay | Admitting: Physician Assistant

## 2021-07-10 ENCOUNTER — Inpatient Hospital Stay: Payer: 59

## 2021-07-10 ENCOUNTER — Encounter: Payer: Self-pay | Admitting: Internal Medicine

## 2021-07-10 ENCOUNTER — Other Ambulatory Visit (HOSPITAL_COMMUNITY): Payer: Self-pay

## 2021-07-10 ENCOUNTER — Inpatient Hospital Stay: Payer: 59 | Attending: Physician Assistant | Admitting: Internal Medicine

## 2021-07-10 ENCOUNTER — Other Ambulatory Visit: Payer: Self-pay

## 2021-07-10 VITALS — HR 99

## 2021-07-10 VITALS — BP 118/82 | HR 108 | Temp 98.0°F | Resp 19 | Ht 72.0 in | Wt 173.0 lb

## 2021-07-10 DIAGNOSIS — C7951 Secondary malignant neoplasm of bone: Secondary | ICD-10-CM | POA: Insufficient documentation

## 2021-07-10 DIAGNOSIS — C3491 Malignant neoplasm of unspecified part of right bronchus or lung: Secondary | ICD-10-CM | POA: Diagnosis not present

## 2021-07-10 DIAGNOSIS — C7931 Secondary malignant neoplasm of brain: Secondary | ICD-10-CM | POA: Insufficient documentation

## 2021-07-10 DIAGNOSIS — Z5112 Encounter for antineoplastic immunotherapy: Secondary | ICD-10-CM | POA: Insufficient documentation

## 2021-07-10 DIAGNOSIS — C3411 Malignant neoplasm of upper lobe, right bronchus or lung: Secondary | ICD-10-CM | POA: Insufficient documentation

## 2021-07-10 DIAGNOSIS — E876 Hypokalemia: Secondary | ICD-10-CM

## 2021-07-10 DIAGNOSIS — Z5111 Encounter for antineoplastic chemotherapy: Secondary | ICD-10-CM | POA: Diagnosis not present

## 2021-07-10 DIAGNOSIS — Z23 Encounter for immunization: Secondary | ICD-10-CM | POA: Insufficient documentation

## 2021-07-10 LAB — CBC WITH DIFFERENTIAL (CANCER CENTER ONLY)
Abs Immature Granulocytes: 0.1 10*3/uL — ABNORMAL HIGH (ref 0.00–0.07)
Basophils Absolute: 0 10*3/uL (ref 0.0–0.1)
Basophils Relative: 0 %
Eosinophils Absolute: 0.1 10*3/uL (ref 0.0–0.5)
Eosinophils Relative: 1 %
HCT: 39.3 % (ref 39.0–52.0)
Hemoglobin: 12.6 g/dL — ABNORMAL LOW (ref 13.0–17.0)
Immature Granulocytes: 1 %
Lymphocytes Relative: 16 %
Lymphs Abs: 1.6 10*3/uL (ref 0.7–4.0)
MCH: 31.3 pg (ref 26.0–34.0)
MCHC: 32.1 g/dL (ref 30.0–36.0)
MCV: 97.8 fL (ref 80.0–100.0)
Monocytes Absolute: 0.8 10*3/uL (ref 0.1–1.0)
Monocytes Relative: 8 %
Neutro Abs: 7.2 10*3/uL (ref 1.7–7.7)
Neutrophils Relative %: 74 %
Platelet Count: 227 10*3/uL (ref 150–400)
RBC: 4.02 MIL/uL — ABNORMAL LOW (ref 4.22–5.81)
RDW: 19.3 % — ABNORMAL HIGH (ref 11.5–15.5)
WBC Count: 9.8 10*3/uL (ref 4.0–10.5)
nRBC: 0 % (ref 0.0–0.2)

## 2021-07-10 LAB — CMP (CANCER CENTER ONLY)
ALT: 43 U/L (ref 0–44)
AST: 25 U/L (ref 15–41)
Albumin: 3.8 g/dL (ref 3.5–5.0)
Alkaline Phosphatase: 85 U/L (ref 38–126)
Anion gap: 10 (ref 5–15)
BUN: 13 mg/dL (ref 8–23)
CO2: 29 mmol/L (ref 22–32)
Calcium: 9.6 mg/dL (ref 8.9–10.3)
Chloride: 103 mmol/L (ref 98–111)
Creatinine: 0.85 mg/dL (ref 0.61–1.24)
GFR, Estimated: >60 mL/min (ref >60)
Glucose, Bld: 123 mg/dL — ABNORMAL HIGH (ref 70–99)
Potassium: 3.2 mmol/L — ABNORMAL LOW (ref 3.5–5.1)
Sodium: 142 mmol/L (ref 135–145)
Total Bilirubin: 0.3 mg/dL (ref 0.3–1.2)
Total Protein: 6.6 g/dL (ref 6.5–8.1)

## 2021-07-10 MED ORDER — SODIUM CHLORIDE 0.9 % IV SOLN
10.0000 mg | Freq: Once | INTRAVENOUS | Status: AC
  Administered 2021-07-10: 10 mg via INTRAVENOUS
  Filled 2021-07-10: qty 10

## 2021-07-10 MED ORDER — SODIUM CHLORIDE 0.9 % IV SOLN
540.0000 mg | Freq: Once | INTRAVENOUS | Status: AC
  Administered 2021-07-10: 540 mg via INTRAVENOUS
  Filled 2021-07-10: qty 54

## 2021-07-10 MED ORDER — SODIUM CHLORIDE 0.9 % IV SOLN
200.0000 mg | Freq: Once | INTRAVENOUS | Status: AC
  Administered 2021-07-10: 200 mg via INTRAVENOUS
  Filled 2021-07-10: qty 8

## 2021-07-10 MED ORDER — POTASSIUM CHLORIDE CRYS ER 20 MEQ PO TBCR
20.0000 meq | EXTENDED_RELEASE_TABLET | Freq: Every day | ORAL | 0 refills | Status: DC
  Filled 2021-07-10: qty 5, 5d supply, fill #0

## 2021-07-10 MED ORDER — SODIUM CHLORIDE 0.9 % IV SOLN
150.0000 mg | Freq: Once | INTRAVENOUS | Status: AC
  Administered 2021-07-10: 150 mg via INTRAVENOUS
  Filled 2021-07-10: qty 150

## 2021-07-10 MED ORDER — SODIUM CHLORIDE 0.9 % IV SOLN
500.0000 mg/m2 | Freq: Once | INTRAVENOUS | Status: AC
  Administered 2021-07-10: 1000 mg via INTRAVENOUS
  Filled 2021-07-10: qty 40

## 2021-07-10 MED ORDER — PALONOSETRON HCL INJECTION 0.25 MG/5ML
0.2500 mg | Freq: Once | INTRAVENOUS | Status: AC
  Administered 2021-07-10: 0.25 mg via INTRAVENOUS
  Filled 2021-07-10: qty 5

## 2021-07-10 MED ORDER — SODIUM CHLORIDE 0.9 % IV SOLN: Freq: Once | INTRAVENOUS | Status: AC

## 2021-07-10 NOTE — Patient Instructions (Signed)
[facility]   Discharge Instructions: Thank you for choosing [facility] to provide your oncology and hematology care.   If you have a lab appointment with the [facility], please go directly to the [facility] and check in at the registration area.   Wear comfortable clothing and clothing appropriate for easy access to any Portacath or PICC line.   We strive to give you quality time with your provider. You may need to reschedule your appointment if you arrive late (15 or more minutes).  Arriving late affects you and other patients whose appointments are after yours.  Also, if you miss three or more appointments without notifying the office, you may be dismissed from the clinic at the provider's discretion.      For prescription refill requests, have your pharmacy contact our office and allow 72 hours for refills to be completed.    Today you received the following chemotherapy and/or immunotherapy agents: Carboplatin (Paraplatin), Pemetrexed (Alimta), and Pembrolizumab (Keytruda).      To help prevent nausea and vomiting after your treatment, we encourage you to take your nausea medication as directed.  BELOW ARE SYMPTOMS THAT SHOULD BE REPORTED IMMEDIATELY: *FEVER GREATER THAN 100.4 F (38 C) OR HIGHER *CHILLS OR SWEATING *NAUSEA AND VOMITING THAT IS NOT CONTROLLED WITH YOUR NAUSEA MEDICATION *UNUSUAL SHORTNESS OF BREATH *UNUSUAL BRUISING OR BLEEDING *URINARY PROBLEMS (pain or burning when urinating, or frequent urination) *BOWEL PROBLEMS (unusual diarrhea, constipation, pain near the anus) TENDERNESS IN MOUTH AND THROAT WITH OR WITHOUT PRESENCE OF ULCERS (sore throat, sores in mouth, or a toothache) UNUSUAL RASH, SWELLING OR PAIN  UNUSUAL VAGINAL DISCHARGE OR ITCHING   Items with * indicate a potential emergency and should be followed up as soon as possible or go to the Emergency Department if any problems should occur.  Please show  the CHEMOTHERAPY ALERT CARD or IMMUNOTHERAPY ALERT CARD at check-in to the Emergency Department and triage nurse.  Should you have questions after your visit or need to cancel or reschedule your appointment, please contact [facility]  Dept: [phone]  and follow the prompts.  Office hours are 8:00 a.m. to 4:30 p.m. Monday - Friday. Please note that voicemails left after 4:00 p.m. may not be returned until the following business day.  We are closed weekends and major holidays. You have access to a nurse at all times for urgent questions. Please call the main number to the clinic Dept: [phone] and follow the prompts.   For any non-urgent questions, you may also contact your provider using MyChart. We now offer e-Visits for anyone 11 and older to request care online for non-urgent symptoms. For details visit [email or web address].   Also download the MyChart app! Go to the app store, search "MyChart", open the app, select [facility], and log in with your MyChart username and password.  Due to Covid, a mask is required upon entering the hospital/clinic. If you do not have a mask, one will be given to you upon arrival. For doctor visits, patients may have 1 support person aged 55 or older with them. For treatment visits, patients cannot have anyone with them due to current Covid guidelines and our immunocompromised population.

## 2021-07-10 NOTE — Progress Notes (Addendum)
Confirmed patient is taking Dex 2mg  po daily due to low cortisol levels from Dr. Dorris Fetch. Ok to proceed with chemotherapy today. Continue Carboplatin dose at 540mg .  Acquanetta Belling, RPH, BCPS, BCOP 07/10/2021 3:23 PM

## 2021-07-10 NOTE — Progress Notes (Signed)
[facility] [phone]   [phone]  OFFICE PROGRESS NOTE  [name], [place] [address]  DIAGNOSIS: Stage IV (T1b, N3, M1C) non-small cell lung cancer, favoring adenocarcinoma presented with right upper lobe lung nodule in addition to right hilar, subcarinal and bilateral mediastinal as well as supraclavicular lymphadenopathy in addition to bone and brain metastasis diagnosed in June 2022.     PD-L1 expression 80%.     Molecular Studies:  Biomarker Findings Microsatellite status - MS-Stable Tumor Mutational Burden - 6 Muts/Mb Genomic Findings For a complete list of the genes assayed, please refer to the Appendix. KRAS G12C, amplification ATM S470* CCND1 amplification - equivocal? HGF amplification - equivocal? MYC amplification - equivocal? FGF19 amplification - equivocal? FGF3 amplification - equivocal? FGF4 amplification - equivocal? NFKBIA amplification NKX2-1 amplification RAD21 amplification - equivocal? [id number]*26 TERT promoter -124C>T TP53 rearrangement exon 9 7 Disease relevant genes with no reportable alterations: ALK, BRAF, EGFR, ERBB2, MET, RET, ROS1   PRIOR THERAPY: SRS to the metastatic brain lesions under the care of Dr. MTammi Klippel  Last treatment on 05/04/2021.   CURRENT THERAPY: Palliative systemic chemotherapy with carboplatin for an AUC 5, Alimta 500 mg per metered squared, Keytruda 200 mg IV every 3 weeks.  First dose expected on 05/08/2021.  Status post 3 cycles.  INTERVAL HISTORY: Bradley Goodman 65y.o. male returns to the clinic today for follow-up visit.  The patient is feeling fine today with no concerning complaints except for mild fatigue and shortness of breath with exertion.  He denied having any current chest pain, cough or hemoptysis.  He denied having any fever or chills.  He has no nausea, vomiting, diarrhea or constipation.  He has no skin rash.  He denied having  any headache or visual changes.  He has no recent weight loss or night sweats.  He continues to tolerate his systemic chemotherapy with carboplatin, Alimta and Keytruda fairly well.  The patient is here today for evaluation with repeat CT scan of the chest, abdomen pelvis before starting cycle #4.   MEDICAL HISTORY: Past Medical History:  Diagnosis Date   Cervical spondylolysis    Essential hypertension    GERD (gastroesophageal reflux disease)    History of kidney stones    History of migraine    Hyperlipidemia    Hypertension    PONV (postoperative nausea and vomiting)    Type 2 diabetes mellitus (HCC)     ALLERGIES:  has No Known Allergies.  MEDICATIONS:  Current Outpatient Medications  Medication Sig Dispense Refill   cholecalciferol (VITAMIN D) 1000 UNITS tablet Take 1,000 Units by mouth every evening.     dexamethasone (DECADRON) 1 MG tablet Take 2 tablets (2 mg total) by mouth daily. 180 tablet 1   docusate sodium (COLACE) 100 MG capsule Take 100 mg by mouth daily.     folic acid (FOLVITE) 1 MG tablet Take 1 tablet (1 mg total) by mouth daily. 30 tablet 4   Krill Oil 300 MG CAPS Take 300 mg by mouth every evening.     levothyroxine (SYNTHROID) 25 MCG tablet Take 1 tablet (25 mcg total) by mouth daily before breakfast. 90 tablet 1   loratadine (CLARITIN) 10 MG tablet Take 10 mg by mouth every evening.      losartan (COZAAR) 50 MG tablet TAKE 1 AND 1/2 TABLETS BY MOUTH DAILY 135 tablet 1   magic mouthwash (nystatin, diphenhydrAMINE, alum & mag hydroxide)  suspension mixture Swish and spit 5 mls up to 4 times daily as needed. (Patient not taking: Reported on 06/19/2021) 240 mL 1   mirtazapine (REMERON) 15 MG tablet Take 1 tablet (15 mg total) by mouth at bedtime. 30 tablet 2   Multiple Vitamins-Minerals (MULTIVITAMIN GUMMIES ADULT PO) Take 2 each by mouth daily.     pantoprazole (PROTONIX) 40 MG tablet Take 1 tablet by mouth every evening. 30 tablet 5   prochlorperazine  (COMPAZINE) 10 MG tablet Take 1 tablet (10 mg total) by mouth every 6 (six) hours as needed for nausea or vomiting. (Patient not taking: Reported on 06/19/2021) 30 tablet 0   rosuvastatin (CRESTOR) 20 MG tablet Take 1 tablet (20 mg total) by mouth daily. 90 tablet 1   SYRINGE-NEEDLE, DISP, 3 ML 21G X 1-1/2" 3 ML MISC Use to inject testosterone every week 50 each 0   testosterone cypionate (DEPO-TESTOSTERONE) 100 MG/ML injection Inject 0.5 mLs (50 mg total) into the muscle every 7 (seven) days. For IM use only 10 mL 0   venlafaxine XR (EFFEXOR-XR) 75 MG 24 hr capsule Take 1 capsule (75 mg total) by mouth daily with breakfast. 90 capsule 1   No current facility-administered medications for this visit.    SURGICAL HISTORY:  Past Surgical History:  Procedure Laterality Date   Bilateral inguinal hernia repair     BRONCHIAL NEEDLE ASPIRATION BIOPSY  04/05/2021   Procedure: BRONCHIAL NEEDLE ASPIRATION BIOPSIES;  Surgeon: Garner Nash, DO;  Location: [facility];  Service: Pulmonary;;   COLONOSCOPY  01/23/2012   Procedure: COLONOSCOPY;  Surgeon: Daneil Dolin, MD;  [facility];  Service: Endoscopy;  Laterality: N/A;  9:30 AM   COLONOSCOPY N/A 10/11/2015   Procedure: COLONOSCOPY;  Surgeon: Daneil Dolin, MD;  [facility];  Service: Endoscopy;  Laterality: N/A;  830   COLONOSCOPY N/A 10/14/2019   Procedure: COLONOSCOPY;  Surgeon: Daneil Dolin, MD;  [facility];  Service: Endoscopy;  Laterality: N/A;  1:45   POLYPECTOMY  10/14/2019   Procedure: POLYPECTOMY;  Surgeon: Daneil Dolin, MD;  [facility];  Service: Endoscopy;;  ascending colon, descending colon   VIDEO BRONCHOSCOPY WITH ENDOBRONCHIAL ULTRASOUND N/A 04/05/2021   Procedure: VIDEO BRONCHOSCOPY WITH ENDOBRONCHIAL ULTRASOUND;  Surgeon: Garner Nash, DO;  Location: [facility];  Service: Pulmonary;  Laterality: N/A;    REVIEW OF SYSTEMS:  Constitutional: positive for fatigue Eyes:  negative Ears, nose, mouth, throat, and face: negative Respiratory: positive for dyspnea on exertion Cardiovascular: negative Gastrointestinal: negative Genitourinary:negative Integument/breast: negative Hematologic/lymphatic: negative Musculoskeletal:negative Neurological: negative Behavioral/Psych: negative Endocrine: negative Allergic/Immunologic: negative   PHYSICAL EXAMINATION: General appearance: alert, cooperative, appears stated age, and no distress Head: Normocephalic, without obvious abnormality, atraumatic Neck: no adenopathy, no JVD, supple, symmetrical, trachea midline, and thyroid not enlarged, symmetric, no tenderness/mass/nodules Lymph nodes: Cervical, supraclavicular, and axillary nodes normal. Resp: clear to auscultation bilaterally Back: symmetric, no curvature. ROM normal. No CVA tenderness. Cardio: regular rate and rhythm, S1, S2 normal, no murmur, click, rub or gallop GI: soft, non-tender; bowel sounds normal; no masses,  no organomegaly Extremities: extremities normal, atraumatic, no cyanosis or edema Neurologic: Alert and oriented X 3, normal strength and tone. Normal symmetric reflexes. Normal coordination and gait  ECOG PERFORMANCE STATUS: 1 - Symptomatic but completely ambulatory  Blood pressure 118/82, pulse (!) 108, temperature 98 F (36.7 C), temperature source Tympanic, resp. rate 19, height 6' (1.829 m), weight 173 lb (78.5 kg), SpO2 98 %.  LABORATORY DATA: Lab Results  Component Value Date   WBC 8.1 07/03/2021   HGB 12.1 (L) 07/03/2021   HCT 36.5 (L) 07/03/2021   MCV 95.5 07/03/2021   PLT 150 07/03/2021      Chemistry      Component Value Date/Time   NA 144 07/03/2021 1447   NA 144 01/10/2021 0849   K 3.8 07/03/2021 1447   CL 109 07/03/2021 1447   CO2 25 07/03/2021 1447   BUN 14 07/03/2021 1447   BUN 15 01/10/2021 0849   CREATININE 0.86 07/03/2021 1447      Component Value Date/Time   CALCIUM 9.1 07/03/2021 1447   ALKPHOS 86  07/03/2021 1447   AST 20 07/03/2021 1447   ALT 35 07/03/2021 1447   BILITOT <0.2 (L) 07/03/2021 1447       RADIOGRAPHIC STUDIES: CT Chest W Contrast  Result Date: 07/06/2021 CLINICAL DATA:  Non-small cell lung cancer. Lung cancer diagnosed June 2022. Chemotherapy immunotherapy in progress. Radiation therapy complete. EXAM: CT CHEST, ABDOMEN, AND PELVIS WITH CONTRAST TECHNIQUE: Multidetector CT imaging of the chest, abdomen and pelvis was performed following the standard protocol during bolus administration of intravenous contrast. CONTRAST:  57m OMNIPAQUE IOHEXOL 350 MG/ML SOLN COMPARISON:  PET-CT 622 PET-CT 04/07/2021, CT 03/29/2021 FINDINGS: CT CHEST FINDINGS Cardiovascular: No significant vascular findings. Normal heart size. No pericardial effusion. Mediastinum/Nodes: Interval decrease in size of subcarinal lymph node measuring 8 mm short axis decreased form 20 mm. Small RIGHT paratracheal lymph nodes are slightly reduced in size now measure less than 5 mm. Lungs/Pleura: Peripheral nodule in the RIGHT upper lobe which was previously hypermetabolic is decreased in size measuring 7 mm (image 49/6) compared to 12 mm. Peribronchial thickening in the RIGHT upper lobe is decreased. SMall RIGHT upper lobe nodule measuring 4 mm (image 65/6) is unchanged. Peripheral nodule in the RIGHT posterior upper lobe measuring 4 mm (image 42) is also unchanged. No new nodules are present In LEFT upper lobe ground-glass nodule image 80/6 measuring 5 mm and unchanged. Musculoskeletal: No aggressive osseous lesion. CT ABDOMEN AND PELVIS FINDINGS Hepatobiliary: Hypodense lesion in the inferior RIGHT hepatic lobe measuring 5 mm image 75/2) is not changed. Pancreas: Pancreas is normal. No ductal dilatation. No pancreatic inflammation. Spleen: Adrenal glands normal. Nonobstructing calculus in the LEFT kidney. Ureters and bladder normal Adrenals/urinary tract: Adrenal glands and kidneys are normal. The ureters and bladder  normal. Stomach/Bowel: Stomach, small bowel, appendix, and cecum are normal. The colon and rectosigmoid colon are normal. Vascular/Lymphatic: Abdominal aorta is normal caliber with atherosclerotic calcification. There is no retroperitoneal or periportal lymphadenopathy. No pelvic lymphadenopathy. Reproductive: Prostate unremarkable Other: No peritoneal disease Musculoskeletal: No aggressive osseous lesion. IMPRESSION: Chest Impression: 1. Decrease in size of subcarinal adenopathy and small paratracheal nodes. 2. Decrease in size of previous hypermetabolic RIGHT upper lobe pulmonary nodule. 3. No new pulmonary nodularity. Abdomen / Pelvis Impression: 1. No evidence of metastatic disease in the abdomen pelvis. 2. No evidence of skeletal metastasis Electronically Signed   By: SSuzy BouchardM.D.   On: 07/06/2021 12:44   CT Abdomen Pelvis W Contrast  Result Date: 07/06/2021 CLINICAL DATA:  Non-small cell lung cancer. Lung cancer diagnosed June 2022. Chemotherapy immunotherapy in progress. Radiation therapy complete. EXAM: CT CHEST, ABDOMEN, AND PELVIS WITH CONTRAST TECHNIQUE: Multidetector CT imaging of the chest, abdomen and pelvis was performed following the standard protocol during bolus administration of intravenous contrast. CONTRAST:  814mOMNIPAQUE IOHEXOL 350 MG/ML SOLN COMPARISON:  PET-CT 622 PET-CT 04/07/2021, CT 03/29/2021  FINDINGS: CT CHEST FINDINGS Cardiovascular: No significant vascular findings. Normal heart size. No pericardial effusion. Mediastinum/Nodes: Interval decrease in size of subcarinal lymph node measuring 8 mm short axis decreased form 20 mm. Small RIGHT paratracheal lymph nodes are slightly reduced in size now measure less than 5 mm. Lungs/Pleura: Peripheral nodule in the RIGHT upper lobe which was previously hypermetabolic is decreased in size measuring 7 mm (image 49/6) compared to 12 mm. Peribronchial thickening in the RIGHT upper lobe is decreased. SMall RIGHT upper lobe nodule  measuring 4 mm (image 65/6) is unchanged. Peripheral nodule in the RIGHT posterior upper lobe measuring 4 mm (image 42) is also unchanged. No new nodules are present In LEFT upper lobe ground-glass nodule image 80/6 measuring 5 mm and unchanged. Musculoskeletal: No aggressive osseous lesion. CT ABDOMEN AND PELVIS FINDINGS Hepatobiliary: Hypodense lesion in the inferior RIGHT hepatic lobe measuring 5 mm image 75/2) is not changed. Pancreas: Pancreas is normal. No ductal dilatation. No pancreatic inflammation. Spleen: Adrenal glands normal. Nonobstructing calculus in the LEFT kidney. Ureters and bladder normal Adrenals/urinary tract: Adrenal glands and kidneys are normal. The ureters and bladder normal. Stomach/Bowel: Stomach, small bowel, appendix, and cecum are normal. The colon and rectosigmoid colon are normal. Vascular/Lymphatic: Abdominal aorta is normal caliber with atherosclerotic calcification. There is no retroperitoneal or periportal lymphadenopathy. No pelvic lymphadenopathy. Reproductive: Prostate unremarkable Other: No peritoneal disease Musculoskeletal: No aggressive osseous lesion. IMPRESSION: Chest Impression: 1. Decrease in size of subcarinal adenopathy and small paratracheal nodes. 2. Decrease in size of previous hypermetabolic RIGHT upper lobe pulmonary nodule. 3. No new pulmonary nodularity. Abdomen / Pelvis Impression: 1. No evidence of metastatic disease in the abdomen pelvis. 2. No evidence of skeletal metastasis Electronically Signed   By: Suzy Bouchard M.D.   On: 07/06/2021 12:44    ASSESSMENT AND PLAN: This is a very pleasant 65 years old white male recently diagnosed with stage IV (T1b, N3, M1 C) non-small cell lung cancer favoring adenocarcinoma presented with right upper lobe lung nodule in addition to right hilar, subcarinal and bilateral mediastinal as well as supraclavicular lymphadenopathy.  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  His PD-L1  expression is 80% and his molecular studies showed KRAS G12C mutation. The patient underwent SRS to metastatic brain lesion under the care of Dr. Tammi Klippel and he is currently undergoing systemic chemotherapy with carboplatin for AUC of 5, Alimta 500 Mg/M2 and Keytruda 200 Mg IV every 3 weeks status post 3 cycles.   The patient has been tolerating his treatment with systemic chemotherapy fairly well. He had repeat CT scan of the chest, abdomen pelvis performed recently.  I personally and independently reviewed the scan and discussed the results with the patient and his wife. Has a scan showed improvement of his disease with decrease in the size of the subcarinal as well as paratracheal adenopathy as well as decrease in the size of the right upper lobe pulmonary nodule. I recommended for him to continue his current treatment with carboplatin, Alimta and Keytruda for cycle #4. Starting from cycle #5 he will be on maintenance treatment with Alimta and Keytruda every 3 weeks. I will see the patient back for follow-up visit in 3 weeks for evaluation before the next cycle of his treatment. He was advised to call immediately if he has any other concerning symptoms in the interval. The patient voices understanding of current disease status and treatment options and is in agreement with the current care plan.  All  questions were answered. The patient knows to call the clinic with any problems, questions or concerns. We can certainly see the patient much sooner if necessary.  Disclaimer: This note was dictated with voice recognition software. Similar sounding words can inadvertently be transcribed and may not be corrected upon review.

## 2021-07-12 ENCOUNTER — Encounter: Payer: Self-pay | Admitting: Family Medicine

## 2021-07-12 ENCOUNTER — Other Ambulatory Visit (HOSPITAL_COMMUNITY): Payer: Self-pay

## 2021-07-12 ENCOUNTER — Other Ambulatory Visit: Payer: Self-pay

## 2021-07-12 ENCOUNTER — Ambulatory Visit: Payer: 59 | Admitting: Family Medicine

## 2021-07-12 VITALS — BP 129/86 | HR 93 | Temp 97.5°F | Ht 73.0 in | Wt 175.4 lb

## 2021-07-12 DIAGNOSIS — E782 Mixed hyperlipidemia: Secondary | ICD-10-CM

## 2021-07-12 DIAGNOSIS — R7303 Prediabetes: Secondary | ICD-10-CM | POA: Diagnosis not present

## 2021-07-12 DIAGNOSIS — F320 Major depressive disorder, single episode, mild: Secondary | ICD-10-CM | POA: Diagnosis not present

## 2021-07-12 DIAGNOSIS — R630 Anorexia: Secondary | ICD-10-CM | POA: Diagnosis not present

## 2021-07-12 DIAGNOSIS — E559 Vitamin D deficiency, unspecified: Secondary | ICD-10-CM

## 2021-07-12 DIAGNOSIS — I10 Essential (primary) hypertension: Secondary | ICD-10-CM | POA: Diagnosis not present

## 2021-07-12 DIAGNOSIS — G479 Sleep disorder, unspecified: Secondary | ICD-10-CM

## 2021-07-12 LAB — BAYER DCA HB A1C WAIVED: HB A1C (BAYER DCA - WAIVED): 6.2 % — ABNORMAL HIGH (ref 4.8–5.6)

## 2021-07-12 MED ORDER — LOSARTAN POTASSIUM 50 MG PO TABS
75.0000 mg | ORAL_TABLET | Freq: Every day | ORAL | 1 refills | Status: DC
  Filled 2021-07-12 – 2021-07-17 (×3): qty 135, 90d supply, fill #0
  Filled 2021-11-15: qty 135, 90d supply, fill #1

## 2021-07-12 MED ORDER — VENLAFAXINE HCL ER 75 MG PO CP24
75.0000 mg | ORAL_CAPSULE | Freq: Every day | ORAL | 1 refills | Status: DC
  Filled 2021-07-12: qty 90, 90d supply, fill #0
  Filled 2021-10-13: qty 90, 90d supply, fill #1

## 2021-07-12 MED ORDER — ROSUVASTATIN CALCIUM 20 MG PO TABS
20.0000 mg | ORAL_TABLET | Freq: Every day | ORAL | 1 refills | Status: DC
Start: 2021-07-12 — End: 2022-01-10
  Filled 2021-07-12: qty 90, 90d supply, fill #0
  Filled 2021-10-13: qty 90, 90d supply, fill #1

## 2021-07-12 MED ORDER — MIRTAZAPINE 15 MG PO TABS
15.0000 mg | ORAL_TABLET | Freq: Every day | ORAL | 1 refills | Status: DC
  Filled 2021-07-12 – 2021-08-08 (×2): qty 90, 90d supply, fill #0
  Filled 2021-11-02: qty 90, 90d supply, fill #1

## 2021-07-12 NOTE — Progress Notes (Signed)
Assessment & Plan:  1. Current mild episode of major depressive disorder without prior episode ([place]) - Well controlled on current regimen - continue Effexor and Remeron as prescribed - venlafaxine XR (EFFEXOR-XR) 75 MG 24 hr capsule; Take 1 capsule (75 mg total) by mouth daily with breakfast.  Dispense: 90 capsule; Refill: 1 - mirtazapine (REMERON) 15 MG tablet; Take 1 tablet (15 mg total) by mouth at bedtime.  Dispense: 90 tablet; Refill: 1  2. Difficulty sleeping - Well controlled on current regimen - mirtazapine (REMERON) 15 MG tablet; Take 1 tablet (15 mg total) by mouth at bedtime.  Dispense: 90 tablet; Refill: 1  3. Decreased appetite - Well controlled on current regimen - mirtazapine (REMERON) 15 MG tablet; Take 1 tablet (15 mg total) by mouth at bedtime.  Dispense: 90 tablet; Refill: 1  4. Essential hypertension - Well controlled on current regimen - encouraged healthy diet and exercise - losartan (COZAAR) 50 MG tablet; Take 1.5 tablets (75 mg total) by mouth daily.  Dispense: 135 tablet; Refill: 1  5. Mixed hyperlipidemia - Well controlled on current regimen - encouraged healthy diet and exercise - rosuvastatin (CRESTOR) 20 MG tablet; Take 1 tablet (20 mg total) by mouth daily.  Dispense: 90 tablet; Refill: 1 - Lipid panel  6. Vitamin D deficiency - Well controlled on current regimen - continue vitamin D supplement - VITAMIN D 25 Hydroxy (Vit-D Deficiency, Fractures)  7. Prediabetes - Bayer DCA Hb A1c Waived   Return in about 6 months (around 01/10/2022) for annual physical.  [name], NP Student  I personally was present during the history, physical exam, and medical decision-making activities of this service and have verified that the service and findings are accurately documented in the nurse practitioner student's note.  Hendricks Limes, MSN, APRN, FNP-C [facility]   Subjective:    Patient ID: Bradley Goodman, male    DOB:  01-21-1956, 65 y.o.   MRN: [id number]  Patient Care Team: [name], FNP as PCP - General (Family Medicine) Gala Romney Cristopher Estimable, MD as Consulting Physician (Gastroenterology) Celestia Khat, [place] (Optometry) Valrie Hart, RN as Oncology Nurse Navigator (Oncology)   Chief Complaint:  Chief Complaint  Patient presents with   Hypertension   Hyperlipidemia    6 month follow up of chronic medical conditions     HPI: Bradley Goodman is a 65 y.o. male presenting on 07/12/2021 for Hypertension and Hyperlipidemia (6 month follow up of chronic medical conditions )  At his last visit, he was initiated on mirtazapine to help with depression, sleep, and decreased appetite. He is also on effxor for depression. He states that he is eating well, getting adequate exercise, and is generally feeling well. He states his depression is much improved from earlier this year when his cancer was diagnosed.  New complaints: None  Social history:  Relevant past medical, surgical, family and social history reviewed and updated as indicated. Interim medical history since our last visit reviewed.  Allergies and medications reviewed and updated.  DATA REVIEWED: CHART IN EPIC  ROS: Negative unless specifically indicated above in HPI.    Current Outpatient Medications:    cholecalciferol (VITAMIN D) 1000 UNITS tablet, Take 1,000 Units by mouth every evening., Disp: , Rfl:    dexamethasone (DECADRON) 1 MG tablet, Take 2 tablets (2 mg total) by mouth daily., Disp: 180 tablet, Rfl: 1   docusate sodium (COLACE) 100 MG capsule, Take 100 mg by mouth daily., Disp: , Rfl:  folic acid (FOLVITE) 1 MG tablet, Take 1 tablet (1 mg total) by mouth daily., Disp: 30 tablet, Rfl: 4   Krill Oil 300 MG CAPS, Take 300 mg by mouth every evening., Disp: , Rfl:    levothyroxine (SYNTHROID) 25 MCG tablet, Take 1 tablet (25 mcg total) by mouth daily before breakfast., Disp: 90 tablet, Rfl: 1   loratadine (CLARITIN) 10 MG tablet,  Take 10 mg by mouth every evening. , Disp: , Rfl:    magic mouthwash (nystatin, diphenhydrAMINE, alum & mag hydroxide) suspension mixture, Swish and spit 5 mls up to 4 times daily as needed., Disp: 240 mL, Rfl: 1   Multiple Vitamins-Minerals (MULTIVITAMIN GUMMIES ADULT PO), Take 2 each by mouth daily., Disp: , Rfl:    pantoprazole (PROTONIX) 40 MG tablet, Take 1 tablet by mouth every evening., Disp: 30 tablet, Rfl: 5   potassium chloride SA (KLOR-CON) 20 MEQ tablet, Take 1 tablet (20 mEq total) by mouth daily., Disp: 5 tablet, Rfl: 0   prochlorperazine (COMPAZINE) 10 MG tablet, Take 1 tablet (10 mg total) by mouth every 6 (six) hours as needed for nausea or vomiting., Disp: 30 tablet, Rfl: 0   SYRINGE-NEEDLE, DISP, 3 ML 21G X 1-1/2" 3 ML MISC, Use to inject testosterone every week, Disp: 50 each, Rfl: 0   testosterone cypionate (DEPO-TESTOSTERONE) 100 MG/ML injection, Inject 0.5 mLs (50 mg total) into the muscle every 7 (seven) days. For IM use only, Disp: 10 mL, Rfl: 0   losartan (COZAAR) 50 MG tablet, Take 1.5 tablets (75 mg total) by mouth daily., Disp: 135 tablet, Rfl: 1   mirtazapine (REMERON) 15 MG tablet, Take 1 tablet (15 mg total) by mouth at bedtime., Disp: 90 tablet, Rfl: 1   rosuvastatin (CRESTOR) 20 MG tablet, Take 1 tablet (20 mg total) by mouth daily., Disp: 90 tablet, Rfl: 1   venlafaxine XR (EFFEXOR-XR) 75 MG 24 hr capsule, Take 1 capsule (75 mg total) by mouth daily with breakfast., Disp: 90 capsule, Rfl: 1   No Known Allergies Past Medical History:  Diagnosis Date   Cervical spondylolysis    Essential hypertension    GERD (gastroesophageal reflux disease)    History of kidney stones    History of migraine    Hyperlipidemia    Hypertension    PONV (postoperative nausea and vomiting)    Type 2 diabetes mellitus ([place])     Past Surgical History:  Procedure Laterality Date   Bilateral inguinal hernia repair     BRONCHIAL NEEDLE ASPIRATION BIOPSY  04/05/2021   Procedure:  BRONCHIAL NEEDLE ASPIRATION BIOPSIES;  Surgeon: Garner Nash, DO;  Location: [place];  Service: Pulmonary;;   COLONOSCOPY  01/23/2012   Procedure: COLONOSCOPY;  Surgeon: Daneil Dolin, MD;  [facility];  Service: Endoscopy;  Laterality: N/A;  9:30 AM   COLONOSCOPY N/A 10/11/2015   Procedure: COLONOSCOPY;  Surgeon: Daneil Dolin, MD;  [facility];  Service: Endoscopy;  Laterality: N/A;  830   COLONOSCOPY N/A 10/14/2019   Procedure: COLONOSCOPY;  Surgeon: Daneil Dolin, MD;  [facility];  Service: Endoscopy;  Laterality: N/A;  1:45   POLYPECTOMY  10/14/2019   Procedure: POLYPECTOMY;  Surgeon: Daneil Dolin, MD;  [facility];  Service: Endoscopy;;  ascending colon, descending colon   VIDEO BRONCHOSCOPY WITH ENDOBRONCHIAL ULTRASOUND N/A 04/05/2021   Procedure: VIDEO BRONCHOSCOPY WITH ENDOBRONCHIAL ULTRASOUND;  Surgeon: Garner Nash, DO;  Location: [place];  Service: Pulmonary;  Laterality:  N/A;    Social History   Socioeconomic History   Marital status: Married    Spouse name: Not on file   Number of children: Not on file   Years of education: Not on file   Highest education level: Not on file  Occupational History   Not on file  Tobacco Use   Smoking status: Every Day    Packs/day: 0.50    Years: 30.00    Pack years: 15.00    Types: Cigarettes    Start date: 10/30/1973   Smokeless tobacco: Never  Vaping Use   Vaping Use: Never used  Substance and Sexual Activity   Alcohol use: Yes    Alcohol/week: 0.0 standard drinks    Comment: One drink every 6 months.   Drug use: No   Sexual activity: Yes  Other Topics Concern   Not on file  Social History Narrative   Not on file   Social Determinants of Health   Financial Resource Strain: Not on file  Food Insecurity: Not on file  Transportation Needs: Not on file  Physical Activity: Not on file  Stress: Not on file  Social Connections: Not on file  Intimate Partner  Violence: Not on file        Objective:    BP 129/86   Pulse 93   Temp (!) 97.5 F (36.4 C) (Temporal)   Ht 6' 1" (1.854 m)   Wt 79.6 kg   SpO2 100%   BMI 23.14 kg/m   Wt Readings from Last 3 Encounters:  07/12/21 175 lb 6.4 oz (79.6 kg)  07/10/21 173 lb (78.5 kg)  06/27/21 171 lb 12.8 oz (77.9 kg)    Physical Exam Vitals reviewed.  Constitutional:      General: He is not in acute distress.    Appearance: Normal appearance. He is normal weight. He is not ill-appearing, toxic-appearing or diaphoretic.  HENT:     Head: Normocephalic and atraumatic.  Eyes:     General: No scleral icterus.       Right eye: No discharge.        Left eye: No discharge.     Extraocular Movements: Extraocular movements intact.     Conjunctiva/sclera: Conjunctivae normal.     Pupils: Pupils are equal, round, and reactive to light.  Cardiovascular:     Rate and Rhythm: Normal rate and regular rhythm.     Heart sounds: Normal heart sounds. No murmur heard.   No friction rub. No gallop.  Pulmonary:     Effort: Pulmonary effort is normal. No respiratory distress.     Breath sounds: Normal breath sounds. No stridor. No wheezing, rhonchi or rales.  Musculoskeletal:        General: Normal range of motion.     Cervical back: Normal range of motion.  Skin:    General: Skin is warm and dry.  Neurological:     Mental Status: He is alert and oriented to person, place, and time. Mental status is at baseline.  Psychiatric:        Mood and Affect: Mood normal.        Behavior: Behavior normal.        Thought Content: Thought content normal.        Judgment: Judgment normal.    Lab Results  Component Value Date   TSH 0.615 07/03/2021   Lab Results  Component Value Date   WBC 9.8 07/10/2021   HGB 12.6 (L) 07/10/2021   HCT 39.3 07/10/2021  MCV 97.8 07/10/2021   PLT 227 07/10/2021   Lab Results  Component Value Date   NA 142 07/10/2021   K 3.2 (L) 07/10/2021   CO2 29 07/10/2021    GLUCOSE 123 (H) 07/10/2021   BUN 13 07/10/2021   CREATININE 0.85 07/10/2021   BILITOT 0.3 07/10/2021   ALKPHOS 85 07/10/2021   AST 25 07/10/2021   ALT 43 07/10/2021   PROT 6.6 07/10/2021   ALBUMIN 3.8 07/10/2021   CALCIUM 9.6 07/10/2021   ANIONGAP 10 07/10/2021   EGFR 76 01/10/2021   Lab Results  Component Value Date   CHOL 95 (L) 01/10/2021   Lab Results  Component Value Date   HDL 33 (L) 01/10/2021   Lab Results  Component Value Date   LDLCALC 33 01/10/2021   Lab Results  Component Value Date   TRIG 180 (H) 01/10/2021   Lab Results  Component Value Date   CHOLHDL 2.9 01/10/2021   Lab Results  Component Value Date   HGBA1C 6.2 01/10/2021

## 2021-07-13 ENCOUNTER — Other Ambulatory Visit (HOSPITAL_COMMUNITY): Payer: Self-pay

## 2021-07-13 ENCOUNTER — Other Ambulatory Visit: Payer: Self-pay | Admitting: Radiation Therapy

## 2021-07-16 ENCOUNTER — Encounter: Payer: Self-pay | Admitting: Internal Medicine

## 2021-07-16 NOTE — Progress Notes (Signed)
  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  ExacTrac registration was performed for each couch angle.  The 100% isodose line was prescribed.  6 MV X-rays were delivered in the flattening filter free beam mode.    Narrative: The patient tolerated radiation treatment relatively well.     Plan: The patient has completed radiation treatment. The patient will return to radiation oncology clinic for routine followup in one month. I advised him to call or return sooner if he has any questions or concerns related to his recovery or treatment. ________________________________  Sheral Apley. Tammi Klippel, M.D.

## 2021-07-17 ENCOUNTER — Other Ambulatory Visit (HOSPITAL_COMMUNITY): Payer: Self-pay

## 2021-07-17 ENCOUNTER — Other Ambulatory Visit: Payer: 59

## 2021-07-17 LAB — LIPID PANEL
Chol/HDL Ratio: 2.4 ratio (ref 0.0–5.0)
Cholesterol, Total: 136 mg/dL (ref 100–199)
HDL: 56 mg/dL (ref >39)
LDL Chol Calc (NIH): 53 mg/dL (ref 0–99)
Triglycerides: 161 mg/dL — ABNORMAL HIGH (ref 0–149)
VLDL Cholesterol Cal: 27 mg/dL (ref 5–40)

## 2021-07-17 LAB — VITAMIN D 25 HYDROXY (VIT D DEFICIENCY, FRACTURES)

## 2021-07-18 ENCOUNTER — Inpatient Hospital Stay: Payer: 59

## 2021-07-18 ENCOUNTER — Other Ambulatory Visit: Payer: Self-pay

## 2021-07-18 DIAGNOSIS — C7931 Secondary malignant neoplasm of brain: Secondary | ICD-10-CM | POA: Diagnosis not present

## 2021-07-18 DIAGNOSIS — C3411 Malignant neoplasm of upper lobe, right bronchus or lung: Secondary | ICD-10-CM | POA: Diagnosis not present

## 2021-07-18 DIAGNOSIS — Z5111 Encounter for antineoplastic chemotherapy: Secondary | ICD-10-CM | POA: Diagnosis not present

## 2021-07-18 DIAGNOSIS — Z23 Encounter for immunization: Secondary | ICD-10-CM | POA: Diagnosis not present

## 2021-07-18 DIAGNOSIS — Z5112 Encounter for antineoplastic immunotherapy: Secondary | ICD-10-CM | POA: Diagnosis not present

## 2021-07-18 DIAGNOSIS — C3491 Malignant neoplasm of unspecified part of right bronchus or lung: Secondary | ICD-10-CM

## 2021-07-18 DIAGNOSIS — C7951 Secondary malignant neoplasm of bone: Secondary | ICD-10-CM | POA: Diagnosis not present

## 2021-07-18 LAB — CBC WITH DIFFERENTIAL (CANCER CENTER ONLY)
Abs Immature Granulocytes: 0.16 10*3/uL — ABNORMAL HIGH (ref 0.00–0.07)
Basophils Absolute: 0 10*3/uL (ref 0.0–0.1)
Basophils Relative: 0 %
Eosinophils Absolute: 0 10*3/uL (ref 0.0–0.5)
Eosinophils Relative: 0 %
HCT: 37.5 % — ABNORMAL LOW (ref 39.0–52.0)
Hemoglobin: 12 g/dL — ABNORMAL LOW (ref 13.0–17.0)
Immature Granulocytes: 2 %
Lymphocytes Relative: 28 %
Lymphs Abs: 2.9 10*3/uL (ref 0.7–4.0)
MCH: 31.1 pg (ref 26.0–34.0)
MCHC: 32 g/dL (ref 30.0–36.0)
MCV: 97.2 fL (ref 80.0–100.0)
Monocytes Absolute: 0.7 10*3/uL (ref 0.1–1.0)
Monocytes Relative: 7 %
Neutro Abs: 6.5 10*3/uL (ref 1.7–7.7)
Neutrophils Relative %: 63 %
Platelet Count: 134 10*3/uL — ABNORMAL LOW (ref 150–400)
RBC: 3.86 MIL/uL — ABNORMAL LOW (ref 4.22–5.81)
RDW: 19.2 % — ABNORMAL HIGH (ref 11.5–15.5)
WBC Count: 10.3 10*3/uL (ref 4.0–10.5)
nRBC: 0 % (ref 0.0–0.2)

## 2021-07-18 LAB — CMP (CANCER CENTER ONLY)
ALT: 43 U/L (ref 0–44)
AST: 23 U/L (ref 15–41)
Albumin: 3.5 g/dL (ref 3.5–5.0)
Alkaline Phosphatase: 79 U/L (ref 38–126)
Anion gap: 11 (ref 5–15)
BUN: 6 mg/dL — ABNORMAL LOW (ref 8–23)
CO2: 25 mmol/L (ref 22–32)
Calcium: 8.9 mg/dL (ref 8.9–10.3)
Chloride: 107 mmol/L (ref 98–111)
Creatinine: 0.94 mg/dL (ref 0.61–1.24)
GFR, Estimated: >60 mL/min (ref >60)
Glucose, Bld: 221 mg/dL — ABNORMAL HIGH (ref 70–99)
Potassium: 3.2 mmol/L — ABNORMAL LOW (ref 3.5–5.1)
Sodium: 143 mmol/L (ref 135–145)
Total Bilirubin: 0.2 mg/dL — ABNORMAL LOW (ref 0.3–1.2)
Total Protein: 6.4 g/dL — ABNORMAL LOW (ref 6.5–8.1)

## 2021-07-18 LAB — TSH: TSH: 1.263 u[IU]/mL (ref 0.320–4.118)

## 2021-07-19 ENCOUNTER — Other Ambulatory Visit (HOSPITAL_COMMUNITY): Payer: Self-pay

## 2021-07-19 ENCOUNTER — Other Ambulatory Visit: Payer: Self-pay | Admitting: Physician Assistant

## 2021-07-19 DIAGNOSIS — E876 Hypokalemia: Secondary | ICD-10-CM

## 2021-07-19 MED ORDER — POTASSIUM CHLORIDE CRYS ER 20 MEQ PO TBCR
20.0000 meq | EXTENDED_RELEASE_TABLET | Freq: Every day | ORAL | 0 refills | Status: DC
Start: 2021-07-19 — End: 2021-07-19
  Filled 2021-07-19: qty 5, 5d supply, fill #0

## 2021-07-19 MED ORDER — POTASSIUM CHLORIDE CRYS ER 20 MEQ PO TBCR
20.0000 meq | EXTENDED_RELEASE_TABLET | Freq: Every day | ORAL | 0 refills | Status: DC
Start: 2021-07-19 — End: 2021-08-21
  Filled 2021-07-19: qty 7, 7d supply, fill #0

## 2021-07-19 NOTE — Progress Notes (Signed)
Called the patient to let him know I was sending him in a refill of potassium. Spoke to his wife and relayed the message. They are also increasing his dietary intake of potassium as well.

## 2021-07-24 ENCOUNTER — Other Ambulatory Visit: Payer: 59

## 2021-07-24 ENCOUNTER — Other Ambulatory Visit: Payer: Self-pay

## 2021-07-24 ENCOUNTER — Inpatient Hospital Stay: Payer: 59

## 2021-07-24 DIAGNOSIS — Z5112 Encounter for antineoplastic immunotherapy: Secondary | ICD-10-CM | POA: Diagnosis not present

## 2021-07-24 DIAGNOSIS — C7951 Secondary malignant neoplasm of bone: Secondary | ICD-10-CM | POA: Diagnosis not present

## 2021-07-24 DIAGNOSIS — Z23 Encounter for immunization: Secondary | ICD-10-CM | POA: Diagnosis not present

## 2021-07-24 DIAGNOSIS — C7931 Secondary malignant neoplasm of brain: Secondary | ICD-10-CM | POA: Diagnosis not present

## 2021-07-24 DIAGNOSIS — Z5111 Encounter for antineoplastic chemotherapy: Secondary | ICD-10-CM | POA: Diagnosis not present

## 2021-07-24 DIAGNOSIS — C3411 Malignant neoplasm of upper lobe, right bronchus or lung: Secondary | ICD-10-CM | POA: Diagnosis not present

## 2021-07-24 DIAGNOSIS — C3491 Malignant neoplasm of unspecified part of right bronchus or lung: Secondary | ICD-10-CM

## 2021-07-24 LAB — CBC WITH DIFFERENTIAL (CANCER CENTER ONLY)
Abs Immature Granulocytes: 0.07 10*3/uL (ref 0.00–0.07)
Basophils Absolute: 0 10*3/uL (ref 0.0–0.1)
Basophils Relative: 0 %
Eosinophils Absolute: 0.1 10*3/uL (ref 0.0–0.5)
Eosinophils Relative: 1 %
HCT: 36.4 % — ABNORMAL LOW (ref 39.0–52.0)
Hemoglobin: 11.6 g/dL — ABNORMAL LOW (ref 13.0–17.0)
Immature Granulocytes: 1 %
Lymphocytes Relative: 25 %
Lymphs Abs: 2.5 10*3/uL (ref 0.7–4.0)
MCH: 31.7 pg (ref 26.0–34.0)
MCHC: 31.9 g/dL (ref 30.0–36.0)
MCV: 99.5 fL (ref 80.0–100.0)
Monocytes Absolute: 0.7 10*3/uL (ref 0.1–1.0)
Monocytes Relative: 7 %
Neutro Abs: 6.6 10*3/uL (ref 1.7–7.7)
Neutrophils Relative %: 66 %
Platelet Count: 102 10*3/uL — ABNORMAL LOW (ref 150–400)
RBC: 3.66 MIL/uL — ABNORMAL LOW (ref 4.22–5.81)
RDW: 19.5 % — ABNORMAL HIGH (ref 11.5–15.5)
WBC Count: 9.9 10*3/uL (ref 4.0–10.5)
nRBC: 0 % (ref 0.0–0.2)

## 2021-07-24 LAB — CMP (CANCER CENTER ONLY)
ALT: 40 U/L (ref 0–44)
AST: 21 U/L (ref 15–41)
Albumin: 3.5 g/dL (ref 3.5–5.0)
Alkaline Phosphatase: 74 U/L (ref 38–126)
Anion gap: 11 (ref 5–15)
BUN: 8 mg/dL (ref 8–23)
CO2: 26 mmol/L (ref 22–32)
Calcium: 9 mg/dL (ref 8.9–10.3)
Chloride: 107 mmol/L (ref 98–111)
Creatinine: 0.86 mg/dL (ref 0.61–1.24)
GFR, Estimated: >60 mL/min (ref >60)
Glucose, Bld: 165 mg/dL — ABNORMAL HIGH (ref 70–99)
Potassium: 3.4 mmol/L — ABNORMAL LOW (ref 3.5–5.1)
Sodium: 144 mmol/L (ref 135–145)
Total Bilirubin: <0.2 mg/dL — ABNORMAL LOW (ref 0.3–1.2)
Total Protein: 6.2 g/dL — ABNORMAL LOW (ref 6.5–8.1)

## 2021-07-25 ENCOUNTER — Other Ambulatory Visit (HOSPITAL_COMMUNITY): Payer: Self-pay

## 2021-07-27 ENCOUNTER — Other Ambulatory Visit (HOSPITAL_COMMUNITY): Payer: Self-pay

## 2021-07-31 ENCOUNTER — Other Ambulatory Visit: Payer: Self-pay

## 2021-07-31 ENCOUNTER — Inpatient Hospital Stay: Payer: 59

## 2021-07-31 ENCOUNTER — Inpatient Hospital Stay: Payer: 59 | Admitting: Internal Medicine

## 2021-07-31 ENCOUNTER — Encounter: Payer: Self-pay | Admitting: Internal Medicine

## 2021-07-31 VITALS — BP 124/88 | HR 113 | Temp 98.2°F | Resp 19 | Ht 73.0 in | Wt 178.5 lb

## 2021-07-31 VITALS — HR 111

## 2021-07-31 DIAGNOSIS — C3491 Malignant neoplasm of unspecified part of right bronchus or lung: Secondary | ICD-10-CM

## 2021-07-31 DIAGNOSIS — Z5112 Encounter for antineoplastic immunotherapy: Secondary | ICD-10-CM

## 2021-07-31 DIAGNOSIS — Z23 Encounter for immunization: Secondary | ICD-10-CM

## 2021-07-31 DIAGNOSIS — Z5111 Encounter for antineoplastic chemotherapy: Secondary | ICD-10-CM | POA: Diagnosis not present

## 2021-07-31 DIAGNOSIS — C7951 Secondary malignant neoplasm of bone: Secondary | ICD-10-CM | POA: Diagnosis not present

## 2021-07-31 DIAGNOSIS — C7931 Secondary malignant neoplasm of brain: Secondary | ICD-10-CM

## 2021-07-31 DIAGNOSIS — C3411 Malignant neoplasm of upper lobe, right bronchus or lung: Secondary | ICD-10-CM | POA: Diagnosis not present

## 2021-07-31 LAB — CBC WITH DIFFERENTIAL (CANCER CENTER ONLY)
Abs Immature Granulocytes: 0.05 10*3/uL (ref 0.00–0.07)
Basophils Absolute: 0 10*3/uL (ref 0.0–0.1)
Basophils Relative: 0 %
Eosinophils Absolute: 0 10*3/uL (ref 0.0–0.5)
Eosinophils Relative: 1 %
HCT: 36 % — ABNORMAL LOW (ref 39.0–52.0)
Hemoglobin: 12.1 g/dL — ABNORMAL LOW (ref 13.0–17.0)
Immature Granulocytes: 1 %
Lymphocytes Relative: 26 %
Lymphs Abs: 1.8 10*3/uL (ref 0.7–4.0)
MCH: 32.6 pg (ref 26.0–34.0)
MCHC: 33.6 g/dL (ref 30.0–36.0)
MCV: 97 fL (ref 80.0–100.0)
Monocytes Absolute: 0.4 10*3/uL (ref 0.1–1.0)
Monocytes Relative: 6 %
Neutro Abs: 4.5 10*3/uL (ref 1.7–7.7)
Neutrophils Relative %: 66 %
Platelet Count: 170 10*3/uL (ref 150–400)
RBC: 3.71 MIL/uL — ABNORMAL LOW (ref 4.22–5.81)
RDW: 18.9 % — ABNORMAL HIGH (ref 11.5–15.5)
WBC Count: 6.8 10*3/uL (ref 4.0–10.5)
nRBC: 0 % (ref 0.0–0.2)

## 2021-07-31 LAB — CMP (CANCER CENTER ONLY)
ALT: 49 U/L — ABNORMAL HIGH (ref 0–44)
AST: 34 U/L (ref 15–41)
Albumin: 3.7 g/dL (ref 3.5–5.0)
Alkaline Phosphatase: 66 U/L (ref 38–126)
Anion gap: 9 (ref 5–15)
BUN: 8 mg/dL (ref 8–23)
CO2: 26 mmol/L (ref 22–32)
Calcium: 8.9 mg/dL (ref 8.9–10.3)
Chloride: 106 mmol/L (ref 98–111)
Creatinine: 0.81 mg/dL (ref 0.61–1.24)
GFR, Estimated: >60 mL/min (ref >60)
Glucose, Bld: 209 mg/dL — ABNORMAL HIGH (ref 70–99)
Potassium: 3.5 mmol/L (ref 3.5–5.1)
Sodium: 141 mmol/L (ref 135–145)
Total Bilirubin: 0.3 mg/dL (ref 0.3–1.2)
Total Protein: 6.6 g/dL (ref 6.5–8.1)

## 2021-07-31 MED ORDER — SODIUM CHLORIDE 0.9 % IV SOLN
200.0000 mg | Freq: Once | INTRAVENOUS | Status: AC
  Administered 2021-07-31: 200 mg via INTRAVENOUS
  Filled 2021-07-31: qty 8

## 2021-07-31 MED ORDER — PROCHLORPERAZINE MALEATE 10 MG PO TABS
10.0000 mg | ORAL_TABLET | Freq: Once | ORAL | Status: AC
  Administered 2021-07-31: 10 mg via ORAL
  Filled 2021-07-31: qty 1

## 2021-07-31 MED ORDER — INFLUENZA VAC SPLIT QUAD 0.5 ML IM SUSY
0.5000 mL | PREFILLED_SYRINGE | Freq: Once | INTRAMUSCULAR | Status: AC
  Administered 2021-07-31: 0.5 mL via INTRAMUSCULAR
  Filled 2021-07-31: qty 0.5

## 2021-07-31 MED ORDER — PEMETREXED DISODIUM CHEMO INJECTION 500 MG
500.0000 mg/m2 | Freq: Once | INTRAVENOUS | Status: AC
  Administered 2021-07-31: 1000 mg via INTRAVENOUS
  Filled 2021-07-31: qty 40

## 2021-07-31 MED ORDER — SODIUM CHLORIDE 0.9 % IV SOLN: Freq: Once | INTRAVENOUS | Status: AC

## 2021-07-31 NOTE — Patient Instructions (Signed)
Steps to Quit Smoking Smoking tobacco is the leading cause of preventable death. It can affect almost every organ in the body. Smoking puts you and people around you at risk for many serious, long-lasting (chronic) diseases. Quitting smoking can be hard, but it is one of the best things that you can do for your health. It is never too late to quit. How do I get ready to quit? When you decide to quit smoking, make a plan to help you succeed. Before you quit: Pick a date to quit. Set a date within the next 2 weeks to give you time to prepare. Write down the reasons why you are quitting. Keep this list in places where you will see it often. Tell your family, friends, and co-workers that you are quitting. Their support is important. Talk with your doctor about the choices that may help you quit. Find out if your health insurance will pay for these treatments. Know the people, places, things, and activities that make you want to smoke (triggers). Avoid them. What first steps can I take to quit smoking? Throw away all cigarettes at home, at work, and in your car. Throw away the things that you use when you smoke, such as ashtrays and lighters. Clean your car. Make sure to empty the ashtray. Clean your home, including curtains and carpets. What can I do to help me quit smoking? Talk with your doctor about taking medicines and seeing a counselor at the same time. You are more likely to succeed when you do both. If you are pregnant or breastfeeding, talk with your doctor about counseling or other ways to quit smoking. Do not take medicine to help you quit smoking unless your doctor tells you to do so. To quit smoking: Quit right away Quit smoking totally, instead of slowly cutting back on how much you smoke over a period of time. Go to counseling. You are more likely to quit if you go to counseling sessions regularly. Take medicine You may take medicines to help you quit. Some medicines need a  prescription, and some you can buy over-the-counter. Some medicines may contain a drug called nicotine to replace the nicotine in cigarettes. Medicines may: Help you to stop having the desire to smoke (cravings). Help to stop the problems that come when you stop smoking (withdrawal symptoms). Your doctor may ask you to use: Nicotine patches, gum, or lozenges. Nicotine inhalers or sprays. Non-nicotine medicine that is taken by mouth. Find resources Find resources and other ways to help you quit smoking and remain smoke-free after you quit. These resources are most helpful when you use them often. They include: Online chats with a counselor. Phone quitlines. Printed self-help materials. Support groups or group counseling. Text messaging programs. Mobile phone apps. Use apps on your mobile phone or tablet that can help you stick to your quit plan. There are many free apps for mobile phones and tablets as well as websites. Examples include Quit Guide from the CDC and smokefree.gov  What things can I do to make it easier to quit?  Talk to your family and friends. Ask them to support and encourage you. Call a phone quitline (1-800-QUIT-NOW), reach out to support groups, or work with a counselor. Ask people who smoke to not smoke around you. Avoid places that make you want to smoke, such as: Bars. Parties. Smoke-break areas at work. Spend time with people who do not smoke. Lower the stress in your life. Stress can make you want to   smoke. Try these things to help your stress: Getting regular exercise. Doing deep-breathing exercises. Doing yoga. Meditating. Doing a body scan. To do this, close your eyes, focus on one area of your body at a time from head to toe. Notice which parts of your body are tense. Try to relax the muscles in those areas. How will I feel when I quit smoking? Day 1 to 3 weeks Within the first 24 hours, you may start to have some problems that come from quitting tobacco.  These problems are very bad 2-3 days after you quit, but they do not often last for more than 2-3 weeks. You may get these symptoms: Mood swings. Feeling restless, nervous, angry, or annoyed. Trouble concentrating. Dizziness. Strong desire for high-sugar foods and nicotine. Weight gain. Trouble pooping (constipation). Feeling like you may vomit (nausea). Coughing or a sore throat. Changes in how the medicines that you take for other issues work in your body. Depression. Trouble sleeping (insomnia). Week 3 and afterward After the first 2-3 weeks of quitting, you may start to notice more positive results, such as: Better sense of smell and taste. Less coughing and sore throat. Slower heart rate. Lower blood pressure. Clearer skin. Better breathing. Fewer sick days. Quitting smoking can be hard. Do not give up if you fail the first time. Some people need to try a few times before they succeed. Do your best to stick to your quit plan, and talk with your doctor if you have any questions or concerns. Summary Smoking tobacco is the leading cause of preventable death. Quitting smoking can be hard, but it is one of the best things that you can do for your health. When you decide to quit smoking, make a plan to help you succeed. Quit smoking right away, not slowly over a period of time. When you start quitting, seek help from your doctor, family, or friends. This information is not intended to replace advice given to you by your health care provider. Make sure you discuss any questions you have with your health care provider. Document Revised: 06/19/2019 Document Reviewed: 12/13/2018 Elsevier Patient Education  2022 [employer]  

## 2021-07-31 NOTE — Progress Notes (Signed)
Bradley Goodman [phone]   [phone]  OFFICE PROGRESS NOTE  Bradley Goodman, [place] [address]  DIAGNOSIS: Stage IV (T1b, N3, M1C) non-small cell lung cancer, favoring adenocarcinoma presented with right upper lobe lung nodule in addition to right hilar, subcarinal and bilateral mediastinal as well as supraclavicular lymphadenopathy in addition to bone and brain metastasis diagnosed in June 2022.     PD-L1 expression 80%.     Molecular Studies:  Biomarker Findings Microsatellite status - MS-Stable Tumor Mutational Burden - 6 Muts/Mb Genomic Findings For a complete list of the genes assayed, please refer to the Appendix. KRAS G12C, amplification ATM S470* CCND1 amplification - equivocal? HGF amplification - equivocal? MYC amplification - equivocal? FGF19 amplification - equivocal? FGF3 amplification - equivocal? FGF4 amplification - equivocal? NFKBIA amplification NKX2-1 amplification RAD21 amplification - equivocal? [id number]*26 TERT promoter -124C>T TP53 rearrangement exon 9 7 Disease relevant genes with no reportable alterations: ALK, BRAF, EGFR, ERBB2, MET, RET, ROS1   PRIOR THERAPY: SRS to the metastatic brain lesions under the care of Dr. MTammi Klippel  Last treatment on 05/04/2021.   CURRENT THERAPY: Palliative systemic chemotherapy with carboplatin for an AUC 5, Alimta 500 mg/m2 and, Keytruda 200 mg IV every 3 weeks.  First dose expected on 05/08/2021.  Status post 4 cycles.  Starting from cycle #5 he will be on maintenance treatment with Alimta and Keytruda every 3 weeks.  INTERVAL HISTORY: Bradley Goodman 65y.o. male returns to the clinic today for follow-up visit accompanied by his wife.  The patient is feeling fine today with no concerning complaints.  He denied having any chest pain, shortness of breath, cough or hemoptysis.  He denied having any fever or chills.  He has no weight loss or night  sweats.  He is very active and works at regular basis and also plays golf regularly.  He is here today for evaluation before starting cycle #5 of his treatment.   MEDICAL HISTORY: Past Medical History:  Diagnosis Date   Cervical spondylolysis    Essential hypertension    GERD (gastroesophageal reflux disease)    History of kidney stones    History of migraine    Hyperlipidemia    Hypertension    PONV (postoperative nausea and vomiting)    Type 2 diabetes mellitus (HCC)     ALLERGIES:  has No Known Allergies.  MEDICATIONS:  Current Outpatient Medications  Medication Sig Dispense Refill   cholecalciferol (VITAMIN D) 1000 UNITS tablet Take 1,000 Units by mouth every evening.     dexamethasone (DECADRON) 1 MG tablet Take 2 tablets (2 mg total) by mouth daily. 180 tablet 1   docusate sodium (COLACE) 100 MG capsule Take 100 mg by mouth daily.     folic acid (FOLVITE) 1 MG tablet Take 1 tablet (1 mg total) by mouth daily. 30 tablet 4   Krill Oil 300 MG CAPS Take 300 mg by mouth every evening.     levothyroxine (SYNTHROID) 25 MCG tablet Take 1 tablet (25 mcg total) by mouth daily before breakfast. 90 tablet 1   loratadine (CLARITIN) 10 MG tablet Take 10 mg by mouth every evening.      losartan (COZAAR) 50 MG tablet Take 1 and 1/2 tablets (75 mg total) by mouth daily. 135 tablet 1   magic mouthwash (nystatin, diphenhydrAMINE, alum & mag hydroxide) suspension mixture Swish and spit 5 mls up to 4 times daily as needed. 240 mL 1  mirtazapine (REMERON) 15 MG tablet Take 1 tablet (15 mg total) by mouth at bedtime. 90 tablet 1   Multiple Vitamins-Minerals (MULTIVITAMIN GUMMIES ADULT PO) Take 2 each by mouth daily.     pantoprazole (PROTONIX) 40 MG tablet Take 1 tablet by mouth every evening. 30 tablet 5   potassium chloride SA (KLOR-CON) 20 MEQ tablet Take 1 tablet (20 mEq total) by mouth daily. 7 tablet 0   prochlorperazine (COMPAZINE) 10 MG tablet Take 1 tablet (10 mg total) by mouth every 6  (six) hours as needed for nausea or vomiting. 30 tablet 0   rosuvastatin (CRESTOR) 20 MG tablet Take 1 tablet (20 mg total) by mouth daily. 90 tablet 1   SYRINGE-NEEDLE, DISP, 3 ML 21G X 1-1/2" 3 ML MISC Use to inject testosterone every week 50 each 0   testosterone cypionate (DEPO-TESTOSTERONE) 100 MG/ML injection Inject 0.5 mLs (50 mg total) into the muscle every 7 (seven) days. For IM use only 10 mL 0   venlafaxine XR (EFFEXOR-XR) 75 MG 24 hr capsule Take 1 capsule (75 mg total) by mouth daily with breakfast. 90 capsule 1   No current facility-administered medications for this visit.    SURGICAL HISTORY:  Past Surgical History:  Procedure Laterality Date   Bilateral inguinal hernia repair     BRONCHIAL NEEDLE ASPIRATION BIOPSY  04/05/2021   Procedure: BRONCHIAL NEEDLE ASPIRATION BIOPSIES;  Surgeon: Garner Nash, DO;  Location: [facility];  Service: Pulmonary;;   COLONOSCOPY  01/23/2012   Procedure: COLONOSCOPY;  Surgeon: Daneil Dolin, MD;  [facility];  Service: Endoscopy;  Laterality: N/A;  9:30 AM   COLONOSCOPY N/A 10/11/2015   Procedure: COLONOSCOPY;  Surgeon: Daneil Dolin, MD;  [facility];  Service: Endoscopy;  Laterality: N/A;  830   COLONOSCOPY N/A 10/14/2019   Procedure: COLONOSCOPY;  Surgeon: Daneil Dolin, MD;  [facility];  Service: Endoscopy;  Laterality: N/A;  1:45   POLYPECTOMY  10/14/2019   Procedure: POLYPECTOMY;  Surgeon: Daneil Dolin, MD;  [facility];  Service: Endoscopy;;  ascending colon, descending colon   VIDEO BRONCHOSCOPY WITH ENDOBRONCHIAL ULTRASOUND N/A 04/05/2021   Procedure: VIDEO BRONCHOSCOPY WITH ENDOBRONCHIAL ULTRASOUND;  Surgeon: Garner Nash, DO;  Location: [facility];  Service: Pulmonary;  Laterality: N/A;    REVIEW OF SYSTEMS:  A comprehensive review of systems was negative.   PHYSICAL EXAMINATION: General appearance: alert, cooperative, appears stated age, and no distress Head:  Normocephalic, without obvious abnormality, atraumatic Neck: no adenopathy, no JVD, supple, symmetrical, trachea midline, and thyroid not enlarged, symmetric, no tenderness/mass/nodules Lymph nodes: Cervical, supraclavicular, and axillary nodes normal. Resp: clear to auscultation bilaterally Back: symmetric, no curvature. ROM normal. No CVA tenderness. Cardio: regular rate and rhythm, S1, S2 normal, no murmur, click, rub or gallop GI: soft, non-tender; bowel sounds normal; no masses,  no organomegaly Extremities: extremities normal, atraumatic, no cyanosis or edema  ECOG PERFORMANCE STATUS: 1 - Symptomatic but completely ambulatory  Blood pressure 124/88, pulse (!) 113, temperature 98.2 F (36.8 C), temperature source Tympanic, resp. rate 19, height _0  (1.854 m), weight 178 lb 8 oz (81 kg), SpO2 100 %.  LABORATORY DATA: Lab Results  Component Value Date   WBC 6.8 07/31/2021   HGB 12.1 (L) 07/31/2021   HCT 36.0 (L) 07/31/2021   MCV 97.0 07/31/2021   PLT 170 07/31/2021      Chemistry      Component Value Date/Time   NA 144  07/24/2021 1155   NA 144 01/10/2021 0849   K 3.4 (L) 07/24/2021 1155   CL 107 07/24/2021 1155   CO2 26 07/24/2021 1155   BUN 8 07/24/2021 1155   BUN 15 01/10/2021 0849   CREATININE 0.86 07/24/2021 1155      Component Value Date/Time   CALCIUM 9.0 07/24/2021 1155   ALKPHOS 74 07/24/2021 1155   AST 21 07/24/2021 1155   ALT 40 07/24/2021 1155   BILITOT <0.2 (L) 07/24/2021 1155       RADIOGRAPHIC STUDIES: CT Chest W Contrast  Result Date: 07/06/2021 CLINICAL DATA:  Non-small cell lung cancer. Lung cancer diagnosed June 2022. Chemotherapy immunotherapy in progress. Radiation therapy complete. EXAM: CT CHEST, ABDOMEN, AND PELVIS WITH CONTRAST TECHNIQUE: Multidetector CT imaging of the chest, abdomen and pelvis was performed following the standard protocol during bolus administration of intravenous contrast. CONTRAST:  31m OMNIPAQUE IOHEXOL 350 MG/ML  SOLN COMPARISON:  PET-CT 622 PET-CT 04/07/2021, CT 03/29/2021 FINDINGS: CT CHEST FINDINGS Cardiovascular: No significant vascular findings. Normal heart size. No pericardial effusion. Mediastinum/Nodes: Interval decrease in size of subcarinal lymph node measuring 8 mm short axis decreased form 20 mm. Small RIGHT paratracheal lymph nodes are slightly reduced in size now measure less than 5 mm. Lungs/Pleura: Peripheral nodule in the RIGHT upper lobe which was previously hypermetabolic is decreased in size measuring 7 mm (image 49/6) compared to 12 mm. Peribronchial thickening in the RIGHT upper lobe is decreased. SMall RIGHT upper lobe nodule measuring 4 mm (image 65/6) is unchanged. Peripheral nodule in the RIGHT posterior upper lobe measuring 4 mm (image 42) is also unchanged. No new nodules are present In LEFT upper lobe ground-glass nodule image 80/6 measuring 5 mm and unchanged. Musculoskeletal: No aggressive osseous lesion. CT ABDOMEN AND PELVIS FINDINGS Hepatobiliary: Hypodense lesion in the inferior RIGHT hepatic lobe measuring 5 mm image 75/2) is not changed. Pancreas: Pancreas is normal. No ductal dilatation. No pancreatic inflammation. Spleen: Adrenal glands normal. Nonobstructing calculus in the LEFT kidney. Ureters and bladder normal Adrenals/urinary tract: Adrenal glands and kidneys are normal. The ureters and bladder normal. Stomach/Bowel: Stomach, small bowel, appendix, and cecum are normal. The colon and rectosigmoid colon are normal. Vascular/Lymphatic: Abdominal aorta is normal caliber with atherosclerotic calcification. There is no retroperitoneal or periportal lymphadenopathy. No pelvic lymphadenopathy. Reproductive: Prostate unremarkable Other: No peritoneal disease Musculoskeletal: No aggressive osseous lesion. IMPRESSION: Chest Impression: 1. Decrease in size of subcarinal adenopathy and small paratracheal nodes. 2. Decrease in size of previous hypermetabolic RIGHT upper lobe pulmonary nodule.  3. No new pulmonary nodularity. Abdomen / Pelvis Impression: 1. No evidence of metastatic disease in the abdomen pelvis. 2. No evidence of skeletal metastasis Electronically Signed   By: SSuzy BouchardM.D.   On: 07/06/2021 12:44   CT Abdomen Pelvis W Contrast  Result Date: 07/06/2021 CLINICAL DATA:  Non-small cell lung cancer. Lung cancer diagnosed June 2022. Chemotherapy immunotherapy in progress. Radiation therapy complete. EXAM: CT CHEST, ABDOMEN, AND PELVIS WITH CONTRAST TECHNIQUE: Multidetector CT imaging of the chest, abdomen and pelvis was performed following the standard protocol during bolus administration of intravenous contrast. CONTRAST:  855mOMNIPAQUE IOHEXOL 350 MG/ML SOLN COMPARISON:  PET-CT 622 PET-CT 04/07/2021, CT 03/29/2021 FINDINGS: CT CHEST FINDINGS Cardiovascular: No significant vascular findings. Normal heart size. No pericardial effusion. Mediastinum/Nodes: Interval decrease in size of subcarinal lymph node measuring 8 mm short axis decreased form 20 mm. Small RIGHT paratracheal lymph nodes are slightly reduced in size now measure less than 5 mm. Lungs/Pleura: Peripheral nodule  in the RIGHT upper lobe which was previously hypermetabolic is decreased in size measuring 7 mm (image 49/6) compared to 12 mm. Peribronchial thickening in the RIGHT upper lobe is decreased. SMall RIGHT upper lobe nodule measuring 4 mm (image 65/6) is unchanged. Peripheral nodule in the RIGHT posterior upper lobe measuring 4 mm (image 42) is also unchanged. No new nodules are present In LEFT upper lobe ground-glass nodule image 80/6 measuring 5 mm and unchanged. Musculoskeletal: No aggressive osseous lesion. CT ABDOMEN AND PELVIS FINDINGS Hepatobiliary: Hypodense lesion in the inferior RIGHT hepatic lobe measuring 5 mm image 75/2) is not changed. Pancreas: Pancreas is normal. No ductal dilatation. No pancreatic inflammation. Spleen: Adrenal glands normal. Nonobstructing calculus in the LEFT kidney. Ureters and  bladder normal Adrenals/urinary tract: Adrenal glands and kidneys are normal. The ureters and bladder normal. Stomach/Bowel: Stomach, small bowel, appendix, and cecum are normal. The colon and rectosigmoid colon are normal. Vascular/Lymphatic: Abdominal aorta is normal caliber with atherosclerotic calcification. There is no retroperitoneal or periportal lymphadenopathy. No pelvic lymphadenopathy. Reproductive: Prostate unremarkable Other: No peritoneal disease Musculoskeletal: No aggressive osseous lesion. IMPRESSION: Chest Impression: 1. Decrease in size of subcarinal adenopathy and small paratracheal nodes. 2. Decrease in size of previous hypermetabolic RIGHT upper lobe pulmonary nodule. 3. No new pulmonary nodularity. Abdomen / Pelvis Impression: 1. No evidence of metastatic disease in the abdomen pelvis. 2. No evidence of skeletal metastasis Electronically Signed   By: Suzy Bouchard M.D.   On: 07/06/2021 12:44    ASSESSMENT AND PLAN: This is a very pleasant 65 years old white male recently diagnosed with stage IV (T1b, N3, M1 C) non-small cell lung cancer favoring adenocarcinoma presented with right upper lobe lung nodule in addition to right hilar, subcarinal and bilateral mediastinal as well as supraclavicular lymphadenopathy.  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  His PD-L1 expression is 80% and his molecular studies showed KRAS G12C mutation. The patient underwent SRS to metastatic brain lesion under the care of Dr. Tammi Klippel and he is currently undergoing systemic chemotherapy with carboplatin for AUC of 5, Alimta 500 Mg/M2 and Keytruda 200 Mg IV every 3 weeks status post 4 cycles.  Starting from cycle #5 the patient will be treated with maintenance treatment with Alimta and Keytruda every 3 weeks. He tolerated the last cycle of his treatment fairly well. I recommended for him to proceed with cycle #5 today as planned. I will see him back for follow-up visit in 3  weeks for evaluation before the next cycle of his treatment. For the tachycardia, will continue to monitor for now and will consider him for IV fluid if needed. The patient was advised to call immediately if he has any concerning symptoms in the interval.  The patient voices understanding of current disease status and treatment options and is in agreement with the current care plan.  All questions were answered. The patient knows to call the clinic with any problems, questions or concerns. We can certainly see the patient much sooner if necessary.  Disclaimer: This note was dictated with voice recognition software. Similar sounding words can inadvertently be transcribed and may not be corrected upon review.

## 2021-07-31 NOTE — Patient Instructions (Addendum)
[facility]  Discharge Instructions: Thank you for choosing [facility] to provide your oncology and hematology care.   If you have a lab appointment with the [facility], please go directly to the [facility] and check in at the registration area.   Wear comfortable clothing and clothing appropriate for easy access to any Portacath or PICC line.   We strive to give you quality time with your provider. You may need to reschedule your appointment if you arrive late (15 or more minutes).  Arriving late affects you and other patients whose appointments are after yours.  Also, if you miss three or more appointments without notifying the office, you may be dismissed from the clinic at the provider's discretion.      For prescription refill requests, have your pharmacy contact our office and allow 72 hours for refills to be completed.    Today you received the following chemotherapy and/or immunotherapy agents: Keytruda & Alimta   To help prevent nausea and vomiting after your treatment, we encourage you to take your nausea medication as directed.  BELOW ARE SYMPTOMS THAT SHOULD BE REPORTED IMMEDIATELY: *FEVER GREATER THAN 100.4 F (38 C) OR HIGHER *CHILLS OR SWEATING *NAUSEA AND VOMITING THAT IS NOT CONTROLLED WITH YOUR NAUSEA MEDICATION *UNUSUAL SHORTNESS OF BREATH *UNUSUAL BRUISING OR BLEEDING *URINARY PROBLEMS (pain or burning when urinating, or frequent urination) *BOWEL PROBLEMS (unusual diarrhea, constipation, pain near the anus) TENDERNESS IN MOUTH AND THROAT WITH OR WITHOUT PRESENCE OF ULCERS (sore throat, sores in mouth, or a toothache) UNUSUAL RASH, SWELLING OR PAIN  UNUSUAL VAGINAL DISCHARGE OR ITCHING   Items with * indicate a potential emergency and should be followed up as soon as possible or go to the Emergency Department if any problems should occur.  Please show the CHEMOTHERAPY ALERT CARD or IMMUNOTHERAPY ALERT CARD at  check-in to the Emergency Department and triage nurse.  Should you have questions after your visit or need to cancel or reschedule your appointment, please contact [facility]  Dept: [phone]  and follow the prompts.  Office hours are 8:00 a.m. to 4:30 p.m. Monday - Friday. Please note that voicemails left after 4:00 p.m. may not be returned until the following business day.  We are closed weekends and major holidays. You have access to a nurse at all times for urgent questions. Please call the main number to the clinic Dept: [phone] and follow the prompts.   For any non-urgent questions, you may also contact your provider using MyChart. We now offer e-Visits for anyone 73 and older to request care online for non-urgent symptoms. For details visit [email or web address].   Also download the MyChart app! Go to the app store, search "MyChart", open the app, select [facility], and log in with your MyChart username and password.  Due to Covid, a mask is required upon entering the hospital/clinic. If you do not have a mask, one will be given to you upon arrival. For doctor visits, patients may have 1 support person aged 52 or older with them. For treatment visits, patients cannot have anyone with them due to current Covid guidelines and our immunocompromised population.   Influenza Virus Vaccine injection What is this medication? INFLUENZA VIRUS VACCINE (in floo EN zuh VAHY ruhs vak SEEN) helps to reduce the risk of getting influenza also known as the flu. The vaccine only helps protect you against some strains of the flu. This medicine may be used for  other purposes; ask your health care provider or pharmacist if you have questions. COMMON BRAND NAME(S): Afluria, Afluria Quadrivalent, Agriflu, Alfuria, FLUAD, FLUAD Quadrivalent, Fluarix, Fluarix Quadrivalent, Flublok, Flublok Quadrivalent, FLUCELVAX, FLUCELVAX Quadrivalent, Flulaval, Flulaval Quadrivalent,  Fluvirin, Fluzone, Fluzone High-Dose, Fluzone Intradermal, Fluzone Quadrivalent What should I tell my care team before I take this medication? They need to know if you have any of these conditions: bleeding disorder like hemophilia fever or infection Guillain-Barre syndrome or other neurological problems immune system problems infection with the human immunodeficiency virus (HIV) or AIDS low blood platelet counts multiple sclerosis an unusual or allergic reaction to influenza virus vaccine, latex, other medicines, foods, dyes, or preservatives. Different brands of vaccines contain different allergens. Some may contain latex or eggs. Talk to your doctor about your allergies to make sure that you get the right vaccine. pregnant or trying to get pregnant breast-feeding How should I use this medication? This vaccine is for injection into a muscle or under the skin. It is given by a health care professional. A copy of Vaccine Information Statements will be given before each vaccination. Read this sheet carefully each time. The sheet may change frequently. Talk to your healthcare provider to see which vaccines are right for you. Some vaccines should not be used in all age groups. Overdosage: If you think you have taken too much of this medicine contact a poison control center or emergency room at once. NOTE: This medicine is only for you. Do not share this medicine with others. What if I miss a dose? This does not apply. What may interact with this medication? chemotherapy or radiation therapy medicines that lower your immune system like etanercept, anakinra, infliximab, and adalimumab medicines that treat or prevent blood clots like warfarin phenytoin steroid medicines like prednisone or cortisone theophylline vaccines This list may not describe all possible interactions. Give your health care provider a list of all the medicines, herbs, non-prescription drugs, or dietary supplements you use.  Also tell them if you smoke, drink alcohol, or use illegal drugs. Some items may interact with your medicine. What should I watch for while using this medication? Report any side effects that do not go away within 3 days to your doctor or health care professional. Call your health care provider if any unusual symptoms occur within 6 weeks of receiving this vaccine. You may still catch the flu, but the illness is not usually as bad. You cannot get the flu from the vaccine. The vaccine will not protect against colds or other illnesses that may cause fever. The vaccine is needed every year. What side effects may I notice from receiving this medication? Side effects that you should report to your doctor or health care professional as soon as possible: allergic reactions like skin rash, itching or hives, swelling of the face, lips, or tongue Side effects that usually do not require medical attention (report to your doctor or health care professional if they continue or are bothersome): fever headache muscle aches and pains pain, tenderness, redness, or swelling at the injection site tiredness Side effects that you should report to your doctor or health care professional as soon as possible: allergic reactions like skin rash, itching or hives, swelling of the face, lips, or tongue Side effects that usually do not require medical attention (report to your doctor or health care professional if they continue or are bothersome): fever headache muscle aches and pains pain, tenderness, redness, or swelling at the injection site tiredness This list may not describe  all possible side effects. Call your doctor for medical advice about side effects. You may report side effects to FDA at 1-800-FDA-1088. Where should I keep my medication? The vaccine will be given by a health care professional in a clinic, pharmacy, doctor's office, or other health care setting. You will not be given vaccine doses to store at  home. NOTE: This sheet is a summary. It may not cover all possible information. If you have questions about this medicine, talk to your doctor, pharmacist, or health care provider.  2022 Elsevier/Gold Standard (2020-05-31 19:49:22)

## 2021-07-31 NOTE — Progress Notes (Signed)
Per Dr. Julien Nordmann, it is okay to treat pt today with  Alimta and Keytruda and heart rate of 113.

## 2021-08-03 ENCOUNTER — Telehealth: Payer: Self-pay | Admitting: Internal Medicine

## 2021-08-03 NOTE — Telephone Encounter (Signed)
[facility] PER 10/26 los, pt aware

## 2021-08-04 ENCOUNTER — Other Ambulatory Visit: Payer: 59

## 2021-08-07 ENCOUNTER — Other Ambulatory Visit: Payer: 59

## 2021-08-07 ENCOUNTER — Ambulatory Visit: Payer: 59 | Admitting: Internal Medicine

## 2021-08-08 ENCOUNTER — Other Ambulatory Visit (HOSPITAL_COMMUNITY): Payer: Self-pay

## 2021-08-09 ENCOUNTER — Ambulatory Visit
Admission: RE | Admit: 2021-08-09 | Discharge: 2021-08-09 | Disposition: A | Discharge status: Home / Self Care | Payer: 59 | Source: Ambulatory Visit | Attending: Internal Medicine | Admitting: Internal Medicine

## 2021-08-09 ENCOUNTER — Other Ambulatory Visit: Payer: Self-pay

## 2021-08-09 DIAGNOSIS — E237 Disorder of pituitary gland, unspecified: Secondary | ICD-10-CM | POA: Diagnosis not present

## 2021-08-09 DIAGNOSIS — C7931 Secondary malignant neoplasm of brain: Secondary | ICD-10-CM

## 2021-08-09 DIAGNOSIS — G936 Cerebral edema: Secondary | ICD-10-CM | POA: Diagnosis not present

## 2021-08-09 IMAGING — MR MR HEAD WO/W CM
13 series · 48 of 48 positions shown · IV contrast (14ml multihance)
Comparison: MRI of the brain [DATE].

CLINICAL DATA: Brain metastasis; brain/CNS neoplasm, assess
treatment response.

EXAM:
MRI HEAD WITHOUT AND WITH CONTRAST
TECHNIQUE: Multiplanar, multiecho pulse sequences of the brain and surrounding
structures were obtained without and with intravenous contrast.
CONTRAST:  14mL MULTIHANCE GADOBENATE DIMEGLUMINE 529 MG/ML IV SOLN

[Series 2: FLAIR · sagittal · 3.0mm · 0.75mm/px · 2 of 39 slices shown (1 of 2)]
[im 1/39]
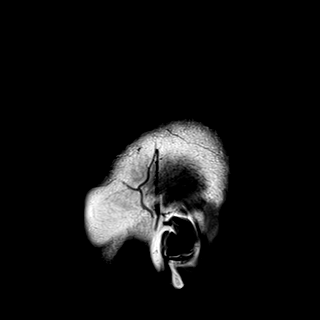
[im 39/39]
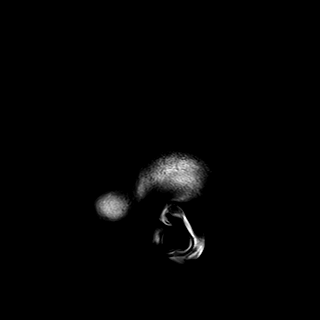

[Series 3: DWI · axial · 3.0mm · 1.50mm/px · z∈[-53,+95]mm · 4 of 78 slices shown (1 of 2)]
[im 1/78]
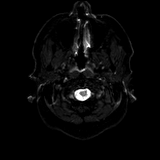
[im 26/78]
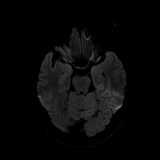
[im 52/78]
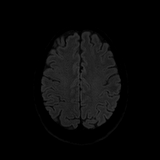
[im 78/78]
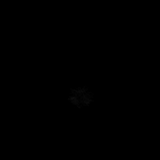

[Series 4: DWI · axial · 3.0mm · 1.50mm/px · z∈[-53,+95]mm · 2 of 38 slices shown (2 of 2)]
[im 1/38]
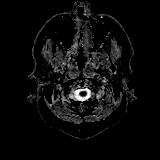
[im 38/38]
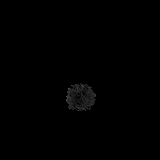

[Series 5: T2 · axial · 5.0mm · 0.57mm/px · 1 of 27 slices shown (1 of 2)]
[im 1/27]
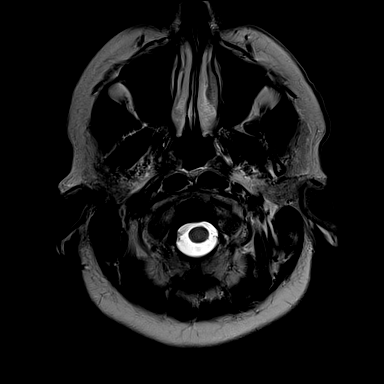

[Series 6: swi_images · axial · 1.5mm · 0.90mm/px · z∈[-53,+102]mm · 5 of 104 slices shown]
[im 1/104]
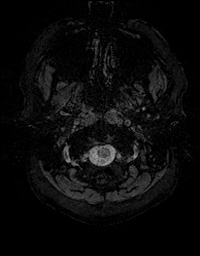
[im 26/104]
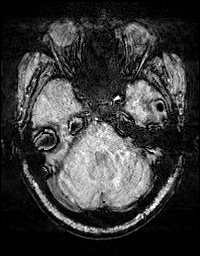
[im 52/104]
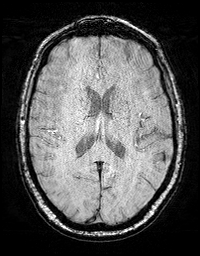
[im 78/104]
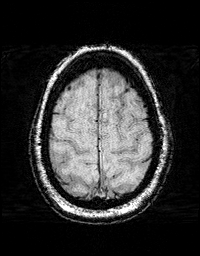
[im 104/104]
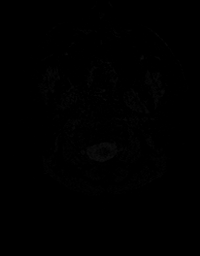

[Series 7: mip_images(sw) · axial · 12.0mm · 0.90mm/px · z∈[-48,+96]mm · 4 of 97 slices shown]
[im 1/97]
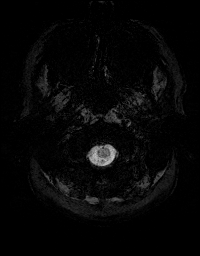
[im 33/97]
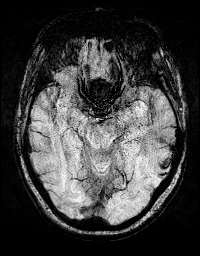
[im 65/97]
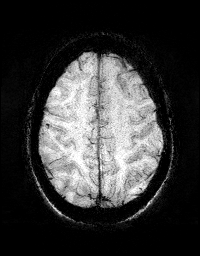
[im 97/97]
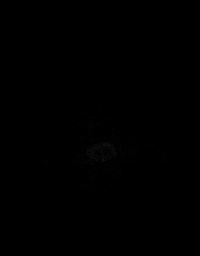

[Series 8: FLAIR · axial · 3.0mm · 0.86mm/px · z∈[-82,+101]mm · 3 of 62 slices shown (2 of 2)]
[im 1/62]
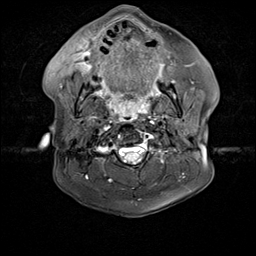
[im 31/62]
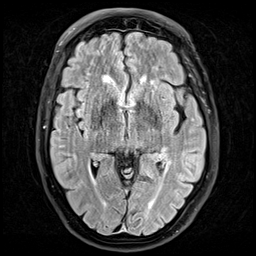
[im 62/62]
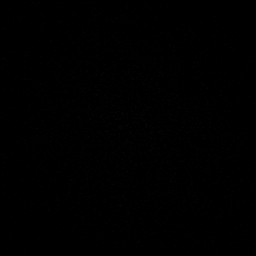

[Series 9: T2 · axial · non-contrast · 1.0mm · 0.86mm/px · z∈[-50,+106]mm · 7 of 160 slices shown (2 of 2)]
[im 1/160]
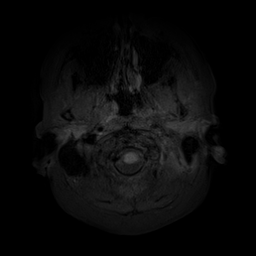
[im 27/160]
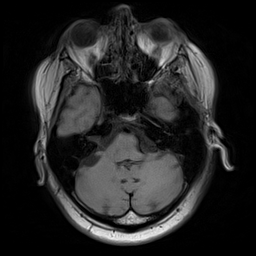
[im 54/160]
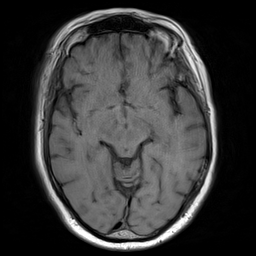
[im 80/160]
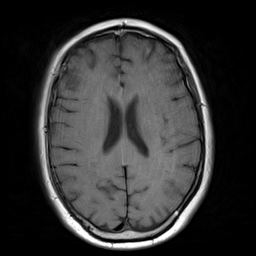
[im 107/160]
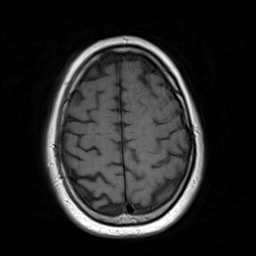
[im 133/160]
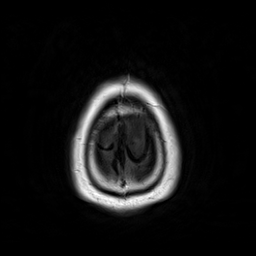
[im 160/160]
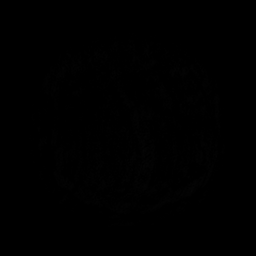

[Series 10: T2 post-contrast · coronal · 3.0mm · 0.57mm/px · 2 of 48 slices shown (1 of 2)]
[im 1/48]
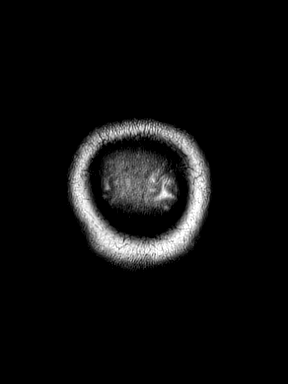
[im 48/48]
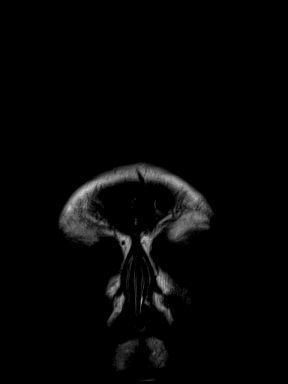

[Series 11: T2 post-contrast · axial · 1.0mm · 0.86mm/px · z∈[-50,+106]mm · 7 of 160 slices shown (2 of 2)]
[im 1/160]
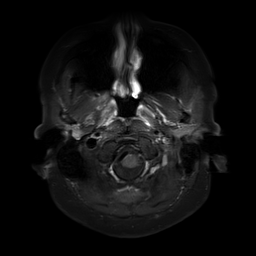
[im 27/160]
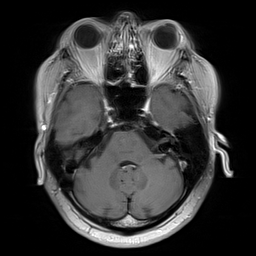
[im 54/160]
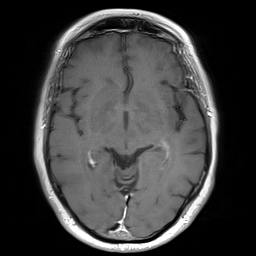
[im 80/160]
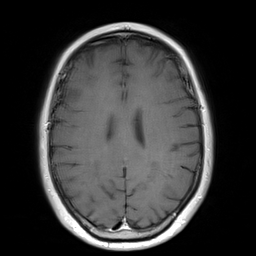
[im 107/160]
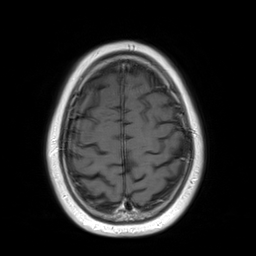
[im 133/160]
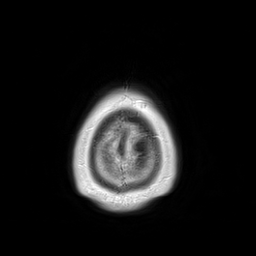
[im 160/160]
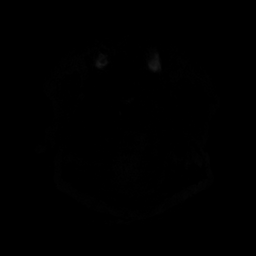

[Series 12: T1 post-contrast · axial · 1.0mm · 0.75mm/px · z∈[-52,+107]mm · 7 of 160 slices shown (1 of 2)]
[im 1/160]
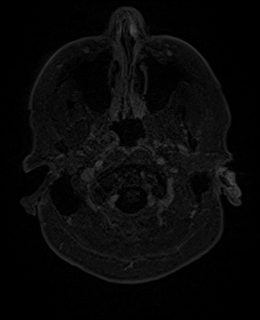
[im 27/160]
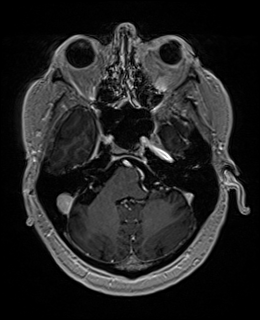
[im 54/160]
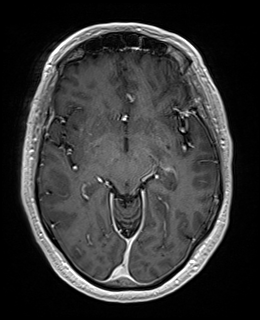
[im 80/160]
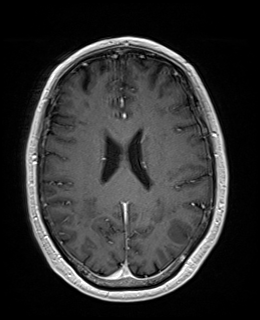
[im 107/160]
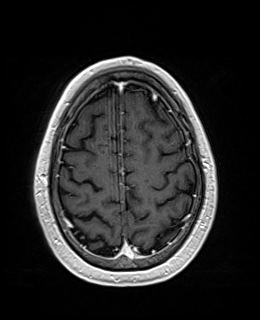
[im 133/160]
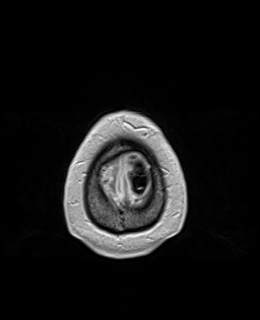
[im 160/160]
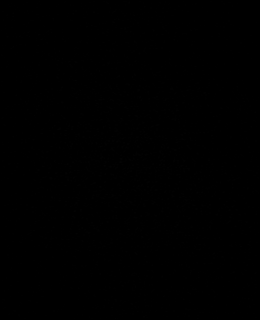

[Series 13: T1 post-contrast · coronal · 3.0mm · 0.57mm/px · 2 of 48 slices shown (2 of 2)]
[im 1/48]
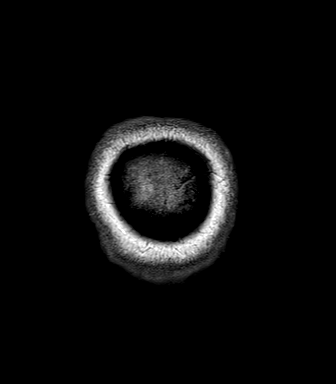
[im 48/48]
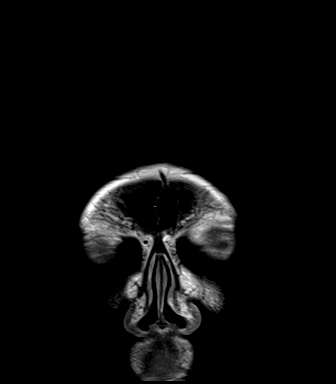

[Series 14: FLAIR post-contrast · sagittal · 3.0mm · 0.75mm/px · 2 of 39 slices shown]
[im 1/39]
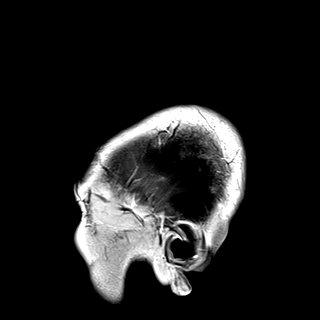
[im 39/39]
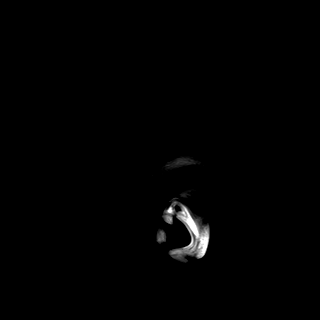

[48 of 48 positions shown; findings below may reference images not displayed]

FINDINGS: Brain: No acute infarction, hemorrhage, hydrocephalus or extra-axial
collection.

Interval decrease in size of the right parietooccipital lesion now
measuring 2 mm compared to 7 mm on prior (series 11, image 77).
There is also of interval decrease in size of the enhancing lesion
in the left cerebella hemisphere measuring 2 mm compared to 5 mm on
prior (series 11, image 31). There is also a decrease in the
vasogenic edema associated with these lesions. Punctate focus of
contrast enhancement in the anterior left parietal lobe described on
prior MRI is less evident, noting faint focus of enhancement in the
current study (series 11, image 93) no other focus of abnormal
contrast enhancement identified.

Interval improvement of the lesion involving the pituitary stalk and
extending into the sella with residual increased thickness at the
infundibulum measuring up to 4 mm compared to 6 mm on prior.

Scattered foci of T2 hyperintensity within the white matter of the
cerebral hemispheres nonspecific, most likely related to chronic
small vessel ischemia.

Vascular: Normal flow voids.

Skull and upper cervical spine: A 1.2 cm T2 hyperintense lesion the
maxilla to the left of midline may represent a dentigerous cyst.
Normal marrow signal.

Sinuses/Orbits: Negative.

Other: None.
IMPRESSION: Findings consistent with partial treatment response with decrease in
size of lesions described on prior MRI without evidence of new
lesions. A left cerebellar hemisphere, right parietooccipital, left
parietal lesions and pituitary infundibular have decreased in size.

## 2021-08-09 MED ORDER — GADOBENATE DIMEGLUMINE 529 MG/ML IV SOLN
14.0000 mL | Freq: Once | INTRAVENOUS | Status: AC | PRN
  Administered 2021-08-09: 14 mL via INTRAVENOUS

## 2021-08-14 ENCOUNTER — Other Ambulatory Visit: Payer: Self-pay | Admitting: Internal Medicine

## 2021-08-14 ENCOUNTER — Other Ambulatory Visit: Payer: Self-pay | Admitting: Medical Oncology

## 2021-08-14 ENCOUNTER — Other Ambulatory Visit: Payer: 59

## 2021-08-14 ENCOUNTER — Inpatient Hospital Stay: Payer: 59 | Attending: Physician Assistant

## 2021-08-14 DIAGNOSIS — Z5111 Encounter for antineoplastic chemotherapy: Secondary | ICD-10-CM | POA: Insufficient documentation

## 2021-08-14 DIAGNOSIS — Z5112 Encounter for antineoplastic immunotherapy: Secondary | ICD-10-CM | POA: Insufficient documentation

## 2021-08-14 DIAGNOSIS — Z923 Personal history of irradiation: Secondary | ICD-10-CM | POA: Insufficient documentation

## 2021-08-14 DIAGNOSIS — C7951 Secondary malignant neoplasm of bone: Secondary | ICD-10-CM | POA: Insufficient documentation

## 2021-08-14 DIAGNOSIS — Z79899 Other long term (current) drug therapy: Secondary | ICD-10-CM | POA: Insufficient documentation

## 2021-08-14 DIAGNOSIS — C3411 Malignant neoplasm of upper lobe, right bronchus or lung: Secondary | ICD-10-CM | POA: Insufficient documentation

## 2021-08-14 DIAGNOSIS — F17210 Nicotine dependence, cigarettes, uncomplicated: Secondary | ICD-10-CM | POA: Insufficient documentation

## 2021-08-14 DIAGNOSIS — C3491 Malignant neoplasm of unspecified part of right bronchus or lung: Secondary | ICD-10-CM

## 2021-08-14 DIAGNOSIS — C7931 Secondary malignant neoplasm of brain: Secondary | ICD-10-CM | POA: Insufficient documentation

## 2021-08-14 DIAGNOSIS — E538 Deficiency of other specified B group vitamins: Secondary | ICD-10-CM | POA: Insufficient documentation

## 2021-08-15 ENCOUNTER — Other Ambulatory Visit: Payer: Self-pay

## 2021-08-15 ENCOUNTER — Inpatient Hospital Stay: Payer: 59 | Admitting: Internal Medicine

## 2021-08-15 ENCOUNTER — Inpatient Hospital Stay: Payer: 59

## 2021-08-15 ENCOUNTER — Telehealth: Payer: Self-pay | Admitting: Internal Medicine

## 2021-08-15 VITALS — BP 131/81 | HR 104 | Temp 97.7°F | Resp 20 | Wt 181.5 lb

## 2021-08-15 DIAGNOSIS — C3491 Malignant neoplasm of unspecified part of right bronchus or lung: Secondary | ICD-10-CM

## 2021-08-15 DIAGNOSIS — Z923 Personal history of irradiation: Secondary | ICD-10-CM | POA: Diagnosis not present

## 2021-08-15 DIAGNOSIS — F17210 Nicotine dependence, cigarettes, uncomplicated: Secondary | ICD-10-CM | POA: Diagnosis not present

## 2021-08-15 DIAGNOSIS — C7931 Secondary malignant neoplasm of brain: Secondary | ICD-10-CM | POA: Diagnosis not present

## 2021-08-15 DIAGNOSIS — Z79899 Other long term (current) drug therapy: Secondary | ICD-10-CM | POA: Diagnosis not present

## 2021-08-15 DIAGNOSIS — Z5111 Encounter for antineoplastic chemotherapy: Secondary | ICD-10-CM | POA: Diagnosis not present

## 2021-08-15 DIAGNOSIS — C3411 Malignant neoplasm of upper lobe, right bronchus or lung: Secondary | ICD-10-CM | POA: Diagnosis not present

## 2021-08-15 DIAGNOSIS — E538 Deficiency of other specified B group vitamins: Secondary | ICD-10-CM | POA: Diagnosis not present

## 2021-08-15 DIAGNOSIS — C7951 Secondary malignant neoplasm of bone: Secondary | ICD-10-CM | POA: Diagnosis not present

## 2021-08-15 DIAGNOSIS — Z5112 Encounter for antineoplastic immunotherapy: Secondary | ICD-10-CM | POA: Diagnosis not present

## 2021-08-15 LAB — CBC WITH DIFFERENTIAL (CANCER CENTER ONLY)
Abs Immature Granulocytes: 0.09 10*3/uL — ABNORMAL HIGH (ref 0.00–0.07)
Basophils Absolute: 0 10*3/uL (ref 0.0–0.1)
Basophils Relative: 0 %
Eosinophils Absolute: 0.1 10*3/uL (ref 0.0–0.5)
Eosinophils Relative: 1 %
HCT: 39.7 % (ref 39.0–52.0)
Hemoglobin: 12.6 g/dL — ABNORMAL LOW (ref 13.0–17.0)
Immature Granulocytes: 1 %
Lymphocytes Relative: 22 %
Lymphs Abs: 2.2 10*3/uL (ref 0.7–4.0)
MCH: 32 pg (ref 26.0–34.0)
MCHC: 31.7 g/dL (ref 30.0–36.0)
MCV: 100.8 fL — ABNORMAL HIGH (ref 80.0–100.0)
Monocytes Absolute: 0.8 10*3/uL (ref 0.1–1.0)
Monocytes Relative: 8 %
Neutro Abs: 6.9 10*3/uL (ref 1.7–7.7)
Neutrophils Relative %: 68 %
Platelet Count: 210 10*3/uL (ref 150–400)
RBC: 3.94 MIL/uL — ABNORMAL LOW (ref 4.22–5.81)
RDW: 18.1 % — ABNORMAL HIGH (ref 11.5–15.5)
WBC Count: 10 10*3/uL (ref 4.0–10.5)
nRBC: 0 % (ref 0.0–0.2)

## 2021-08-15 LAB — TSH: TSH: 0.929 u[IU]/mL (ref 0.320–4.118)

## 2021-08-15 LAB — CMP (CANCER CENTER ONLY)
ALT: 61 U/L — ABNORMAL HIGH (ref 0–44)
AST: 34 U/L (ref 15–41)
Albumin: 3.9 g/dL (ref 3.5–5.0)
Alkaline Phosphatase: 78 U/L (ref 38–126)
Anion gap: 12 (ref 5–15)
BUN: 8 mg/dL (ref 8–23)
CO2: 25 mmol/L (ref 22–32)
Calcium: 8.8 mg/dL — ABNORMAL LOW (ref 8.9–10.3)
Chloride: 105 mmol/L (ref 98–111)
Creatinine: 0.92 mg/dL (ref 0.61–1.24)
GFR, Estimated: >60 mL/min (ref >60)
Glucose, Bld: 230 mg/dL — ABNORMAL HIGH (ref 70–99)
Potassium: 3.2 mmol/L — ABNORMAL LOW (ref 3.5–5.1)
Sodium: 142 mmol/L (ref 135–145)
Total Bilirubin: <0.2 mg/dL — ABNORMAL LOW (ref 0.3–1.2)
Total Protein: 6.9 g/dL (ref 6.5–8.1)

## 2021-08-15 NOTE — Progress Notes (Signed)
[facility] at [address], [place] [address] [phone]   Interval Evaluation  Date of Service: 08/15/21 Patient Name: GONZALO WAYMIRE Patient MRN: [id number] Patient DOB: 01-26-1956 Provider: Ventura Sellers, MD  Identifying Statement:  FERRON ISHMAEL is a 65 y.o. male with Brain metastasis ([place])   Primary Cancer:  Oncologic History: Oncology History  Adenocarcinoma of right lung, stage 4 ([place])  04/25/2021 Initial Diagnosis   Adenocarcinoma of right lung, stage 4 ([place])   04/25/2021 Cancer Staging   Staging form: Lung, AJCC 8th Edition - Clinical: Stage IVB (cT1b, cN3, cM1c) - Signed by Curt Bears, MD on 04/25/2021    05/08/2021 -  Chemotherapy   Patient is on Treatment Plan : LUNG CARBOplatin / Pemetrexed / Pembrolizumab q21d Induction x 4 cycles / Maintenance Pemetrexed + Pembrolizumab      CNS Oncologic History 05/04/21: Radiosurgery to 4 brain metastases, 5 fractions to pituitary lesion  Interval History: ISHMAIL MCMANAMON presents today for follow up after recent MRI brain.  He denies new or progressive deficits.  No issues with visual impairment as prior.  Denies seizures and headaches.  H+P (05/23/21) Patient presented to medical attention initially on 03/27/21 with new onset left eyelid drooping.  CNS imaging was obtained, which demonstrated multiple enhancing lesions consistent with brain metastases.  Systemic workup demonstrated lung cancer, which was confirmed through endobronchial biopsy.  He underwent single fraction radiosurgery to brain lesions, and fractionated radiosurgery to pituitary lesion.  Following radiation, eyelid drooping improved, but he developed "tunnel vision" visual deficits, described as "like looking through plastic".  This has been more or less static over the past week or so, though it did improve transiently after dosing decadron with chemotherapy on 05/08/21.   Medications: Current Outpatient  Medications on File Prior to Visit  Medication Sig Dispense Refill   cholecalciferol (VITAMIN D) 1000 UNITS tablet Take 1,000 Units by mouth every evening.     dexamethasone (DECADRON) 1 MG tablet Take 2 tablets (2 mg total) by mouth daily. 180 tablet 1   docusate sodium (COLACE) 100 MG capsule Take 100 mg by mouth daily.     folic acid (FOLVITE) 1 MG tablet Take 1 tablet (1 mg total) by mouth daily. 30 tablet 4   Krill Oil 300 MG CAPS Take 300 mg by mouth every evening.     levothyroxine (SYNTHROID) 25 MCG tablet Take 1 tablet (25 mcg total) by mouth daily before breakfast. 90 tablet 1   loratadine (CLARITIN) 10 MG tablet Take 10 mg by mouth every evening.      losartan (COZAAR) 50 MG tablet Take 1 and 1/2 tablets (75 mg total) by mouth daily. 135 tablet 1   magic mouthwash (nystatin, diphenhydrAMINE, alum & mag hydroxide) suspension mixture Swish and spit 5 mls up to 4 times daily as needed. 240 mL 1   mirtazapine (REMERON) 15 MG tablet Take 1 tablet (15 mg total) by mouth at bedtime. 90 tablet 1   Multiple Vitamins-Minerals (MULTIVITAMIN GUMMIES ADULT PO) Take 2 each by mouth daily.     pantoprazole (PROTONIX) 40 MG tablet Take 1 tablet by mouth every evening. 30 tablet 5   prochlorperazine (COMPAZINE) 10 MG tablet Take 1 tablet (10 mg total) by mouth every 6 (six) hours as needed for nausea or vomiting. 30 tablet 0   rosuvastatin (CRESTOR) 20 MG tablet Take 1 tablet (20 mg total) by mouth daily. 90 tablet 1   SYRINGE-NEEDLE, DISP,  3 ML 21G X 1-1/2" 3 ML MISC Use to inject testosterone every week 50 each 0   testosterone cypionate (DEPO-TESTOSTERONE) 100 MG/ML injection Inject 0.5 mLs (50 mg total) into the muscle every 7 (seven) days. For IM use only 10 mL 0   venlafaxine XR (EFFEXOR-XR) 75 MG 24 hr capsule Take 1 capsule (75 mg total) by mouth daily with breakfast. 90 capsule 1   potassium chloride SA (KLOR-CON) 20 MEQ tablet Take 1 tablet (20 mEq total) by mouth daily. (Patient not taking:  Reported on 08/15/2021) 7 tablet 0   No current facility-administered medications on file prior to visit.    Allergies: No Known Allergies Past Medical History:  Past Medical History:  Diagnosis Date   Cervical spondylolysis    Essential hypertension    GERD (gastroesophageal reflux disease)    History of kidney stones    History of migraine    Hyperlipidemia    Hypertension    PONV (postoperative nausea and vomiting)    Type 2 diabetes mellitus ([place])    Past Surgical History:  Past Surgical History:  Procedure Laterality Date   Bilateral inguinal hernia repair     BRONCHIAL NEEDLE ASPIRATION BIOPSY  04/05/2021   Procedure: BRONCHIAL NEEDLE ASPIRATION BIOPSIES;  Surgeon: Garner Nash, DO;  Location: [facility];  Service: Pulmonary;;   COLONOSCOPY  01/23/2012   Procedure: COLONOSCOPY;  Surgeon: Daneil Dolin, MD;  [facility];  Service: Endoscopy;  Laterality: N/A;  9:30 AM   COLONOSCOPY N/A 10/11/2015   Procedure: COLONOSCOPY;  Surgeon: Daneil Dolin, MD;  [facility];  Service: Endoscopy;  Laterality: N/A;  830   COLONOSCOPY N/A 10/14/2019   Procedure: COLONOSCOPY;  Surgeon: Daneil Dolin, MD;  [facility];  Service: Endoscopy;  Laterality: N/A;  1:45   POLYPECTOMY  10/14/2019   Procedure: POLYPECTOMY;  Surgeon: Daneil Dolin, MD;  [facility];  Service: Endoscopy;;  ascending colon, descending colon   VIDEO BRONCHOSCOPY WITH ENDOBRONCHIAL ULTRASOUND N/A 04/05/2021   Procedure: VIDEO BRONCHOSCOPY WITH ENDOBRONCHIAL ULTRASOUND;  Surgeon: Garner Nash, DO;  Location: [facility];  Service: Pulmonary;  Laterality: N/A;   Social History:  Social History   Socioeconomic History   Marital status: Married    Spouse name: Not on file   Number of children: Not on file   Years of education: Not on file   Highest education level: Not on file  Occupational History   Not on file  Tobacco Use   Smoking status: Every Day     Packs/day: 0.50    Years: 30.00    Pack years: 15.00    Types: Cigarettes    Start date: 10/30/1973   Smokeless tobacco: Never  Vaping Use   Vaping Use: Never used  Substance and Sexual Activity   Alcohol use: Yes    Alcohol/week: 0.0 standard drinks    Comment: One drink every 6 months.   Drug use: No   Sexual activity: Yes  Other Topics Concern   Not on file  Social History Narrative   Not on file   Social Determinants of Health   Financial Resource Strain: Not on file  Food Insecurity: Not on file  Transportation Needs: Not on file  Physical Activity: Not on file  Stress: Not on file  Social Connections: Not on file  Intimate Partner Violence: Not on file   Family History:  Family History  Problem Relation Age of Onset  Hypertension Mother    Diabetes Mother    Heart attack Mother    Hypertension Father    Heart attack Father    Heart attack Brother    Colon cancer Neg Hx     Review of Systems: Constitutional: Doesn't report fevers, chills or abnormal weight loss Eyes: Doesn't report blurriness of vision Ears, nose, mouth, throat, and face: Doesn't report sore throat Respiratory: Doesn't report cough, dyspnea or wheezes Cardiovascular: Doesn't report palpitation, chest discomfort  Gastrointestinal:  Doesn't report nausea, constipation, diarrhea GU: Doesn't report incontinence Skin: Doesn't report skin rashes Neurological: Per HPI Musculoskeletal: Doesn't report joint pain Behavioral/Psych: Doesn't report anxiety  Physical Exam: Vitals:   08/15/21 1115  BP: 131/81  Pulse: (!) 104  Resp: 20  Temp: 97.7 F (36.5 C)  SpO2: 98%   KPS: 90. General: Alert, cooperative, pleasant, in no acute distress Head: Normal EENT: No conjunctival injection or scleral icterus.  Lungs: Resp effort normal Cardiac: Regular rate Abdomen: Non-distended abdomen Skin: No rashes cyanosis or petechiae. Extremities: No clubbing or edema  Neurologic Exam: Mental Status:  Awake, alert, attentive to examiner. Oriented to self and environment. Language is fluent with intact comprehension.  Cranial Nerves: Visual acuity is grossly normal. Visual fields are full. Extra-ocular movements intact. No ptosis. Face is symmetric Motor: Tone and bulk are normal. Power is full in both arms and legs. Reflexes are symmetric, no pathologic reflexes present.  Sensory: Intact to light touch Gait: Normal.   Labs: I have reviewed the data as listed    Component Value Date/Time   NA 141 07/31/2021 1011   NA 144 01/10/2021 0849   K 3.5 07/31/2021 1011   CL 106 07/31/2021 1011   CO2 26 07/31/2021 1011   GLUCOSE 209 (H) 07/31/2021 1011   BUN 8 07/31/2021 1011   BUN 15 01/10/2021 0849   CREATININE 0.81 07/31/2021 1011   CALCIUM 8.9 07/31/2021 1011   PROT 6.6 07/31/2021 1011   PROT 6.3 01/10/2021 0849   ALBUMIN 3.7 07/31/2021 1011   ALBUMIN 4.2 01/10/2021 0849   AST 34 07/31/2021 1011   ALT 49 (H) 07/31/2021 1011   ALKPHOS 66 07/31/2021 1011   BILITOT 0.3 07/31/2021 1011   GFRNONAA >60 07/31/2021 1011   GFRAA 82 07/14/2020 0945   Lab Results  Component Value Date   WBC 10.0 08/15/2021   NEUTROABS 6.9 08/15/2021   HGB 12.6 (L) 08/15/2021   HCT 39.7 08/15/2021   MCV 100.8 (H) 08/15/2021   PLT 210 08/15/2021   Imaging:  [facility] Clinician Interpretation: I have personally reviewed the CNS images as listed.  My interpretation, in the context of the patient's clinical presentation, is stable disease  MR BRAIN W WO CONTRAST  Result Date: 08/10/2021 CLINICAL DATA:  Brain metastasis; brain/CNS neoplasm, assess treatment response. EXAM: MRI HEAD WITHOUT AND WITH CONTRAST TECHNIQUE: Multiplanar, multiecho pulse sequences of the brain and surrounding structures were obtained without and with intravenous contrast. CONTRAST:  44mL MULTIHANCE GADOBENATE DIMEGLUMINE 529 MG/ML IV SOLN COMPARISON:  MRI of the brain April 17, 2021. FINDINGS: Brain: No acute infarction, hemorrhage,  hydrocephalus or extra-axial collection. Interval decrease in size of the right parietooccipital lesion now measuring 2 mm compared to 7 mm on prior (series 11, image 77). There is also of interval decrease in size of the enhancing lesion in the left cerebella hemisphere measuring 2 mm compared to 5 mm on prior (series 11, image 31). There is also a decrease in the vasogenic edema associated with these  lesions. Punctate focus of contrast enhancement in the anterior left parietal lobe described on prior MRI is less evident, noting faint focus of enhancement in the current study (series 11, image 93) no other focus of abnormal contrast enhancement identified. Interval improvement of the lesion involving the pituitary stalk and extending into the sella with residual increased thickness at the infundibulum measuring up to 4 mm compared to 6 mm on prior. Scattered foci of T2 hyperintensity within the white matter of the cerebral hemispheres nonspecific, most likely related to chronic small vessel ischemia. Vascular: Normal flow voids. Skull and upper cervical spine: A 1.2 cm T2 hyperintense lesion the maxilla to the left of midline may represent a dentigerous cyst. Normal marrow signal. Sinuses/Orbits: Negative. Other: None. IMPRESSION: Findings consistent with partial treatment response with decrease in size of lesions described on prior MRI without evidence of new lesions. A left cerebellar hemisphere, right parietooccipital, left parietal lesions and pituitary infundibular have decreased in size. Electronically Signed   By: Pedro Earls M.D.   On: 08/10/2021 09:18     Assessment/Plan Brain metastasis ([place])  Leeum D Galiano is clinically and radiographically stable today.  Testosterone deficiency and other endocrine labs will be followed and addressed by Dr. Dorris Fetch.    He will con't to undergo chemotherapy with Dr. Julien Nordmann.  We appreciate the opportunity to participate in the care of WINNIE BARSKY.   We ask that KAILON TREESE return to clinic in 3 months following next brain MRI, or sooner as needed.  All questions were answered. The patient knows to call the clinic with any problems, questions or concerns. No barriers to learning were detected.  The total time spent in the encounter was 30 minutes and more than 50% was on counseling and review of test results   Ventura Sellers, MD Medical Director of Neuro-Oncology [facility] 08/15/21 11:39 AM

## 2021-08-15 NOTE — Telephone Encounter (Signed)
Scheduled per 11/8 los, message has been left with pt

## 2021-08-21 ENCOUNTER — Inpatient Hospital Stay: Payer: 59 | Admitting: Internal Medicine

## 2021-08-21 ENCOUNTER — Inpatient Hospital Stay: Payer: 59

## 2021-08-21 ENCOUNTER — Other Ambulatory Visit: Payer: Self-pay

## 2021-08-21 ENCOUNTER — Encounter: Payer: Self-pay | Admitting: Internal Medicine

## 2021-08-21 VITALS — BP 134/81 | HR 107 | Temp 97.2°F | Resp 19 | Ht 73.0 in | Wt 181.2 lb

## 2021-08-21 DIAGNOSIS — C3411 Malignant neoplasm of upper lobe, right bronchus or lung: Secondary | ICD-10-CM | POA: Diagnosis not present

## 2021-08-21 DIAGNOSIS — Z5111 Encounter for antineoplastic chemotherapy: Secondary | ICD-10-CM

## 2021-08-21 DIAGNOSIS — Z5112 Encounter for antineoplastic immunotherapy: Secondary | ICD-10-CM

## 2021-08-21 DIAGNOSIS — C3490 Malignant neoplasm of unspecified part of unspecified bronchus or lung: Secondary | ICD-10-CM

## 2021-08-21 DIAGNOSIS — Z923 Personal history of irradiation: Secondary | ICD-10-CM | POA: Diagnosis not present

## 2021-08-21 DIAGNOSIS — E538 Deficiency of other specified B group vitamins: Secondary | ICD-10-CM | POA: Diagnosis not present

## 2021-08-21 DIAGNOSIS — C7931 Secondary malignant neoplasm of brain: Secondary | ICD-10-CM

## 2021-08-21 DIAGNOSIS — C3491 Malignant neoplasm of unspecified part of right bronchus or lung: Secondary | ICD-10-CM

## 2021-08-21 DIAGNOSIS — Z79899 Other long term (current) drug therapy: Secondary | ICD-10-CM | POA: Diagnosis not present

## 2021-08-21 DIAGNOSIS — F17210 Nicotine dependence, cigarettes, uncomplicated: Secondary | ICD-10-CM | POA: Diagnosis not present

## 2021-08-21 DIAGNOSIS — C7951 Secondary malignant neoplasm of bone: Secondary | ICD-10-CM

## 2021-08-21 LAB — CBC WITH DIFFERENTIAL (CANCER CENTER ONLY)
Abs Immature Granulocytes: 0.08 10*3/uL — ABNORMAL HIGH (ref 0.00–0.07)
Basophils Absolute: 0.1 10*3/uL (ref 0.0–0.1)
Basophils Relative: 1 %
Eosinophils Absolute: 0 10*3/uL (ref 0.0–0.5)
Eosinophils Relative: 0 %
HCT: 39.9 % (ref 39.0–52.0)
Hemoglobin: 13.1 g/dL (ref 13.0–17.0)
Immature Granulocytes: 1 %
Lymphocytes Relative: 11 %
Lymphs Abs: 1 10*3/uL (ref 0.7–4.0)
MCH: 32.8 pg (ref 26.0–34.0)
MCHC: 32.8 g/dL (ref 30.0–36.0)
MCV: 100 fL (ref 80.0–100.0)
Monocytes Absolute: 0.4 10*3/uL (ref 0.1–1.0)
Monocytes Relative: 4 %
Neutro Abs: 7.2 10*3/uL (ref 1.7–7.7)
Neutrophils Relative %: 83 %
Platelet Count: 240 10*3/uL (ref 150–400)
RBC: 3.99 MIL/uL — ABNORMAL LOW (ref 4.22–5.81)
RDW: 17.1 % — ABNORMAL HIGH (ref 11.5–15.5)
WBC Count: 8.7 10*3/uL (ref 4.0–10.5)
nRBC: 0 % (ref 0.0–0.2)

## 2021-08-21 LAB — CMP (CANCER CENTER ONLY)
ALT: 52 U/L — ABNORMAL HIGH (ref 0–44)
AST: 27 U/L (ref 15–41)
Albumin: 3.8 g/dL (ref 3.5–5.0)
Alkaline Phosphatase: 80 U/L (ref 38–126)
Anion gap: 12 (ref 5–15)
BUN: 9 mg/dL (ref 8–23)
CO2: 24 mmol/L (ref 22–32)
Calcium: 9.2 mg/dL (ref 8.9–10.3)
Chloride: 106 mmol/L (ref 98–111)
Creatinine: 0.93 mg/dL (ref 0.61–1.24)
GFR, Estimated: >60 mL/min (ref >60)
Glucose, Bld: 203 mg/dL — ABNORMAL HIGH (ref 70–99)
Potassium: 4 mmol/L (ref 3.5–5.1)
Sodium: 142 mmol/L (ref 135–145)
Total Bilirubin: 0.3 mg/dL (ref 0.3–1.2)
Total Protein: 6.6 g/dL (ref 6.5–8.1)

## 2021-08-21 LAB — TSH: TSH: 0.691 u[IU]/mL (ref 0.320–4.118)

## 2021-08-21 MED ORDER — SODIUM CHLORIDE 0.9 % IV SOLN: Freq: Once | INTRAVENOUS | Status: AC

## 2021-08-21 MED ORDER — SODIUM CHLORIDE 0.9 % IV SOLN
200.0000 mg | Freq: Once | INTRAVENOUS | Status: AC
  Administered 2021-08-21: 200 mg via INTRAVENOUS
  Filled 2021-08-21: qty 8

## 2021-08-21 MED ORDER — SODIUM CHLORIDE 0.9 % IV SOLN
500.0000 mg/m2 | Freq: Once | INTRAVENOUS | Status: AC
  Administered 2021-08-21: 1000 mg via INTRAVENOUS
  Filled 2021-08-21: qty 40

## 2021-08-21 MED ORDER — PROCHLORPERAZINE MALEATE 10 MG PO TABS
10.0000 mg | ORAL_TABLET | Freq: Once | ORAL | Status: AC
  Administered 2021-08-21: 10 mg via ORAL
  Filled 2021-08-21: qty 1

## 2021-08-21 MED ORDER — CYANOCOBALAMIN 1000 MCG/ML IJ SOLN
1000.0000 ug | Freq: Once | INTRAMUSCULAR | Status: AC
  Administered 2021-08-21: 1000 ug via INTRAMUSCULAR
  Filled 2021-08-21: qty 1

## 2021-08-21 NOTE — Progress Notes (Signed)
[facility] [phone]   [phone]  OFFICE PROGRESS NOTE  [name], [place] [address]  DIAGNOSIS: Stage IV (T1b, N3, M1C) non-small cell lung cancer, favoring adenocarcinoma presented with right upper lobe lung nodule in addition to right hilar, subcarinal and bilateral mediastinal as well as supraclavicular lymphadenopathy in addition to bone and brain metastasis diagnosed in June 2022.     PD-L1 expression 80%.     Molecular Studies:  Biomarker Findings Microsatellite status - MS-Stable Tumor Mutational Burden - 6 Muts/Mb Genomic Findings For a complete list of the genes assayed, please refer to the Appendix. KRAS G12C, amplification ATM S470* CCND1 amplification - equivocal? HGF amplification - equivocal? MYC amplification - equivocal? FGF19 amplification - equivocal? FGF3 amplification - equivocal? FGF4 amplification - equivocal? NFKBIA amplification NKX2-1 amplification RAD21 amplification - equivocal? [id number]*26 TERT promoter -124C>T TP53 rearrangement exon 9 7 Disease relevant genes with no reportable alterations: ALK, BRAF, EGFR, ERBB2, MET, RET, ROS1   PRIOR THERAPY: SRS to the metastatic brain lesions under the care of Dr. MTammi Klippel  Last treatment on 05/04/2021.   CURRENT THERAPY: Palliative systemic chemotherapy with carboplatin for an AUC 5, Alimta 500 mg/m2 and, Keytruda 200 mg IV every 3 weeks.  First dose expected on 05/08/2021.  Status post 5 cycles.  Starting from cycle #5 he will be on maintenance treatment with Alimta and Keytruda every 3 weeks.  INTERVAL HISTORY: TVishnu Goodman 65y.o. male returns to the clinic today for follow-up visit.  The patient is feeling fine today with no concerning complaints.  He denied having any chest pain, shortness of breath, cough or hemoptysis.  He denied having any nausea, vomiting, diarrhea or constipation.  He has no headache or visual  changes.  He has no significant weight loss or night sweats.  He tolerated the last cycle of his treatment fairly well.  He is here today for evaluation before starting cycle #6 of his treatment.  MEDICAL HISTORY: Past Medical History:  Diagnosis Date   Cervical spondylolysis    Essential hypertension    GERD (gastroesophageal reflux disease)    History of kidney stones    History of migraine    Hyperlipidemia    Hypertension    PONV (postoperative nausea and vomiting)    Type 2 diabetes mellitus (HCC)     ALLERGIES:  has No Known Allergies.  MEDICATIONS:  Current Outpatient Medications  Medication Sig Dispense Refill   cholecalciferol (VITAMIN D) 1000 UNITS tablet Take 1,000 Units by mouth every evening.     dexamethasone (DECADRON) 1 MG tablet Take 2 tablets (2 mg total) by mouth daily. 180 tablet 1   docusate sodium (COLACE) 100 MG capsule Take 100 mg by mouth daily.     folic acid (FOLVITE) 1 MG tablet Take 1 tablet (1 mg total) by mouth daily. 30 tablet 4   Krill Oil 300 MG CAPS Take 300 mg by mouth every evening.     levothyroxine (SYNTHROID) 25 MCG tablet Take 1 tablet (25 mcg total) by mouth daily before breakfast. 90 tablet 1   loratadine (CLARITIN) 10 MG tablet Take 10 mg by mouth every evening.      losartan (COZAAR) 50 MG tablet Take 1 and 1/2 tablets (75 mg total) by mouth daily. 135 tablet 1   magic mouthwash (nystatin, diphenhydrAMINE, alum & mag hydroxide) suspension mixture Swish and spit 5 mls up to 4 times daily as needed. 2[place]  mL 1   mirtazapine (REMERON) 15 MG tablet Take 1 tablet (15 mg total) by mouth at bedtime. 90 tablet 1   Multiple Vitamins-Minerals (MULTIVITAMIN GUMMIES ADULT PO) Take 2 each by mouth daily.     pantoprazole (PROTONIX) 40 MG tablet Take 1 tablet by mouth every evening. 30 tablet 5   potassium chloride SA (KLOR-CON) 20 MEQ tablet Take 1 tablet (20 mEq total) by mouth daily. (Patient not taking: Reported on 08/15/2021) 7 tablet 0    prochlorperazine (COMPAZINE) 10 MG tablet Take 1 tablet (10 mg total) by mouth every 6 (six) hours as needed for nausea or vomiting. 30 tablet 0   rosuvastatin (CRESTOR) 20 MG tablet Take 1 tablet (20 mg total) by mouth daily. 90 tablet 1   SYRINGE-NEEDLE, DISP, 3 ML 21G X 1-1/2" 3 ML MISC Use to inject testosterone every week 50 each 0   testosterone cypionate (DEPO-TESTOSTERONE) 100 MG/ML injection Inject 0.5 mLs (50 mg total) into the muscle every 7 (seven) days. For IM use only 10 mL 0   venlafaxine XR (EFFEXOR-XR) 75 MG 24 hr capsule Take 1 capsule (75 mg total) by mouth daily with breakfast. 90 capsule 1   No current facility-administered medications for this visit.    SURGICAL HISTORY:  Past Surgical History:  Procedure Laterality Date   Bilateral inguinal hernia repair     BRONCHIAL NEEDLE ASPIRATION BIOPSY  04/05/2021   Procedure: BRONCHIAL NEEDLE ASPIRATION BIOPSIES;  Surgeon: Garner Nash, DO;  Location: [facility];  Service: Pulmonary;;   COLONOSCOPY  01/23/2012   Procedure: COLONOSCOPY;  Surgeon: Daneil Dolin, MD;  [facility];  Service: Endoscopy;  Laterality: N/A;  9:30 AM   COLONOSCOPY N/A 10/11/2015   Procedure: COLONOSCOPY;  Surgeon: Daneil Dolin, MD;  [facility];  Service: Endoscopy;  Laterality: N/A;  830   COLONOSCOPY N/A 10/14/2019   Procedure: COLONOSCOPY;  Surgeon: Daneil Dolin, MD;  [facility];  Service: Endoscopy;  Laterality: N/A;  1:45   POLYPECTOMY  10/14/2019   Procedure: POLYPECTOMY;  Surgeon: Daneil Dolin, MD;  [facility];  Service: Endoscopy;;  ascending colon, descending colon   VIDEO BRONCHOSCOPY WITH ENDOBRONCHIAL ULTRASOUND N/A 04/05/2021   Procedure: VIDEO BRONCHOSCOPY WITH ENDOBRONCHIAL ULTRASOUND;  Surgeon: Garner Nash, DO;  Location: [facility];  Service: Pulmonary;  Laterality: N/A;    REVIEW OF SYSTEMS:  A comprehensive review of systems was negative.   PHYSICAL EXAMINATION:  General appearance: alert, cooperative, appears stated age, and no distress Head: Normocephalic, without obvious abnormality, atraumatic Neck: no adenopathy, no JVD, supple, symmetrical, trachea midline, and thyroid not enlarged, symmetric, no tenderness/mass/nodules Lymph nodes: Cervical, supraclavicular, and axillary nodes normal. Resp: clear to auscultation bilaterally Back: symmetric, no curvature. ROM normal. No CVA tenderness. Cardio: regular rate and rhythm, S1, S2 normal, no murmur, click, rub or gallop GI: soft, non-tender; bowel sounds normal; no masses,  no organomegaly Extremities: extremities normal, atraumatic, no cyanosis or edema  ECOG PERFORMANCE STATUS: 1 - Symptomatic but completely ambulatory  Blood pressure 134/81, pulse (!) 107, temperature (!) 97.2 F (36.2 C), temperature source Tympanic, resp. rate 19, height 6' 1" (1.854 m), weight 181 lb 3.2 oz (82.2 kg), SpO2 99 %.  LABORATORY DATA: Lab Results  Component Value Date   WBC 10.0 08/15/2021   HGB 12.6 (L) 08/15/2021   HCT 39.7 08/15/2021   MCV 100.8 (H) 08/15/2021   PLT 210 08/15/2021      Chemistry  Component Value Date/Time   NA 142 08/15/2021 1057   NA 144 01/10/2021 0849   K 3.2 (L) 08/15/2021 1057   CL 105 08/15/2021 1057   CO2 25 08/15/2021 1057   BUN 8 08/15/2021 1057   BUN 15 01/10/2021 0849   CREATININE 0.92 08/15/2021 1057      Component Value Date/Time   CALCIUM 8.8 (L) 08/15/2021 1057   ALKPHOS 78 08/15/2021 1057   AST 34 08/15/2021 1057   ALT 61 (H) 08/15/2021 1057   BILITOT <0.2 (L) 08/15/2021 1057       RADIOGRAPHIC STUDIES: MR BRAIN W WO CONTRAST  Result Date: 08/10/2021 CLINICAL DATA:  Brain metastasis; brain/CNS neoplasm, assess treatment response. EXAM: MRI HEAD WITHOUT AND WITH CONTRAST TECHNIQUE: Multiplanar, multiecho pulse sequences of the brain and surrounding structures were obtained without and with intravenous contrast. CONTRAST:  25m MULTIHANCE GADOBENATE  DIMEGLUMINE 529 MG/ML IV SOLN COMPARISON:  MRI of the brain April 17, 2021. FINDINGS: Brain: No acute infarction, hemorrhage, hydrocephalus or extra-axial collection. Interval decrease in size of the right parietooccipital lesion now measuring 2 mm compared to 7 mm on prior (series 11, image 77). There is also of interval decrease in size of the enhancing lesion in the left cerebella hemisphere measuring 2 mm compared to 5 mm on prior (series 11, image 31). There is also a decrease in the vasogenic edema associated with these lesions. Punctate focus of contrast enhancement in the anterior left parietal lobe described on prior MRI is less evident, noting faint focus of enhancement in the current study (series 11, image 93) no other focus of abnormal contrast enhancement identified. Interval improvement of the lesion involving the pituitary stalk and extending into the sella with residual increased thickness at the infundibulum measuring up to 4 mm compared to 6 mm on prior. Scattered foci of T2 hyperintensity within the white matter of the cerebral hemispheres nonspecific, most likely related to chronic small vessel ischemia. Vascular: Normal flow voids. Skull and upper cervical spine: A 1.2 cm T2 hyperintense lesion the maxilla to the left of midline may represent a dentigerous cyst. Normal marrow signal. Sinuses/Orbits: Negative. Other: None. IMPRESSION: Findings consistent with partial treatment response with decrease in size of lesions described on prior MRI without evidence of new lesions. A left cerebellar hemisphere, right parietooccipital, left parietal lesions and pituitary infundibular have decreased in size. Electronically Signed   By: KPedro EarlsM.D.   On: 08/10/2021 09:18    ASSESSMENT AND PLAN: This is a very pleasant 65years old white male recently diagnosed with stage IV (T1b, N3, M1 C) non-small cell lung cancer favoring adenocarcinoma presented with right upper lobe lung nodule  in addition to right hilar, subcarinal and bilateral mediastinal as well as supraclavicular lymphadenopathy.  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  His PD-L1 expression is 80% and his molecular studies showed KRAS G12C mutation. The patient underwent SRS to metastatic brain lesion under the care of Dr. MTammi Klippeland he is currently undergoing systemic chemotherapy with carboplatin for AUC of 5, Alimta 500 Mg/M2 and Keytruda 200 Mg IV every 3 weeks status post 5 cycles.  Starting from cycle #5 the patient will be treated with maintenance treatment with Alimta and Keytruda every 3 weeks. The patient continues to tolerate this treatment well with no concerning adverse effects. I recommended for him to proceed with cycle #6 today as planned. I will see the patient back for follow-up visit in 3  weeks for evaluation with repeat CT scan of the chest, abdomen and pelvis for restaging of his disease. His most recent MRI of the brain showed no concerning finding for disease progression and there was some improvement and the previous lesions. The patient was advised to call immediately if he has any other concerning symptoms in the interval. The patient voices understanding of current disease status and treatment options and is in agreement with the current care plan.  All questions were answered. The patient knows to call the clinic with any problems, questions or concerns. We can certainly see the patient much sooner if necessary.  Disclaimer: This note was dictated with voice recognition software. Similar sounding words can inadvertently be transcribed and may not be corrected upon review.

## 2021-08-21 NOTE — Patient Instructions (Signed)
Steps to Quit Smoking Smoking tobacco is the leading cause of preventable death. It can affect almost every organ in the body. Smoking puts you and people around you at risk for many serious, long-lasting (chronic) diseases. Quitting smoking can be hard, but it is one of the best things that you can do for your health. It is never too late to quit. How do I get ready to quit? When you decide to quit smoking, make a plan to help you succeed. Before you quit: Pick a date to quit. Set a date within the next 2 weeks to give you time to prepare. Write down the reasons why you are quitting. Keep this list in places where you will see it often. Tell your family, friends, and co-workers that you are quitting. Their support is important. Talk with your doctor about the choices that may help you quit. Find out if your health insurance will pay for these treatments. Know the people, places, things, and activities that make you want to smoke (triggers). Avoid them. What first steps can I take to quit smoking? Throw away all cigarettes at home, at work, and in your car. Throw away the things that you use when you smoke, such as ashtrays and lighters. Clean your car. Make sure to empty the ashtray. Clean your home, including curtains and carpets. What can I do to help me quit smoking? Talk with your doctor about taking medicines and seeing a counselor at the same time. You are more likely to succeed when you do both. If you are pregnant or breastfeeding, talk with your doctor about counseling or other ways to quit smoking. Do not take medicine to help you quit smoking unless your doctor tells you to do so. To quit smoking: Quit right away Quit smoking totally, instead of slowly cutting back on how much you smoke over a period of time. Go to counseling. You are more likely to quit if you go to counseling sessions regularly. Take medicine You may take medicines to help you quit. Some medicines need a  prescription, and some you can buy over-the-counter. Some medicines may contain a drug called nicotine to replace the nicotine in cigarettes. Medicines may: Help you to stop having the desire to smoke (cravings). Help to stop the problems that come when you stop smoking (withdrawal symptoms). Your doctor may ask you to use: Nicotine patches, gum, or lozenges. Nicotine inhalers or sprays. Non-nicotine medicine that is taken by mouth. Find resources Find resources and other ways to help you quit smoking and remain smoke-free after you quit. These resources are most helpful when you use them often. They include: Online chats with a counselor. Phone quitlines. Printed self-help materials. Support groups or group counseling. Text messaging programs. Mobile phone apps. Use apps on your mobile phone or tablet that can help you stick to your quit plan. There are many free apps for mobile phones and tablets as well as websites. Examples include Quit Guide from the CDC and smokefree.gov  What things can I do to make it easier to quit?  Talk to your family and friends. Ask them to support and encourage you. Call a phone quitline (1-800-QUIT-NOW), reach out to support groups, or work with a counselor. Ask people who smoke to not smoke around you. Avoid places that make you want to smoke, such as: Bars. Parties. Smoke-break areas at work. Spend time with people who do not smoke. Lower the stress in your life. Stress can make you want to   smoke. Try these things to help your stress: Getting regular exercise. Doing deep-breathing exercises. Doing yoga. Meditating. Doing a body scan. To do this, close your eyes, focus on one area of your body at a time from head to toe. Notice which parts of your body are tense. Try to relax the muscles in those areas. How will I feel when I quit smoking? Day 1 to 3 weeks Within the first 24 hours, you may start to have some problems that come from quitting tobacco.  These problems are very bad 2-3 days after you quit, but they do not often last for more than 2-3 weeks. You may get these symptoms: Mood swings. Feeling restless, nervous, angry, or annoyed. Trouble concentrating. Dizziness. Strong desire for high-sugar foods and nicotine. Weight gain. Trouble pooping (constipation). Feeling like you may vomit (nausea). Coughing or a sore throat. Changes in how the medicines that you take for other issues work in your body. Depression. Trouble sleeping (insomnia). Week 3 and afterward After the first 2-3 weeks of quitting, you may start to notice more positive results, such as: Better sense of smell and taste. Less coughing and sore throat. Slower heart rate. Lower blood pressure. Clearer skin. Better breathing. Fewer sick days. Quitting smoking can be hard. Do not give up if you fail the first time. Some people need to try a few times before they succeed. Do your best to stick to your quit plan, and talk with your doctor if you have any questions or concerns. Summary Smoking tobacco is the leading cause of preventable death. Quitting smoking can be hard, but it is one of the best things that you can do for your health. When you decide to quit smoking, make a plan to help you succeed. Quit smoking right away, not slowly over a period of time. When you start quitting, seek help from your doctor, family, or friends. This information is not intended to replace advice given to you by your health care provider. Make sure you discuss any questions you have with your health care provider. Document Revised: 06/02/2021 Document Reviewed: 12/13/2018 Elsevier Patient Education  2022 Elsevier Inc.  

## 2021-08-21 NOTE — Patient Instructions (Signed)
[facility]  Discharge Instructions: Thank you for choosing [facility] to provide your oncology and hematology care.   If you have a lab appointment with the [facility], please go directly to the [facility] and check in at the registration area.   Wear comfortable clothing and clothing appropriate for easy access to any Portacath or PICC line.   We strive to give you quality time with your provider. You may need to reschedule your appointment if you arrive late (15 or more minutes).  Arriving late affects you and other patients whose appointments are after yours.  Also, if you miss three or more appointments without notifying the office, you may be dismissed from the clinic at the provider's discretion.      For prescription refill requests, have your pharmacy contact our office and allow 72 hours for refills to be completed.    Today you received the following chemotherapy and/or immunotherapy agents Keytruda and alimta      To help prevent nausea and vomiting after your treatment, we encourage you to take your nausea medication as directed.  BELOW ARE SYMPTOMS THAT SHOULD BE REPORTED IMMEDIATELY: *FEVER GREATER THAN 100.4 F (38 C) OR HIGHER *CHILLS OR SWEATING *NAUSEA AND VOMITING THAT IS NOT CONTROLLED WITH YOUR NAUSEA MEDICATION *UNUSUAL SHORTNESS OF BREATH *UNUSUAL BRUISING OR BLEEDING *URINARY PROBLEMS (pain or burning when urinating, or frequent urination) *BOWEL PROBLEMS (unusual diarrhea, constipation, pain near the anus) TENDERNESS IN MOUTH AND THROAT WITH OR WITHOUT PRESENCE OF ULCERS (sore throat, sores in mouth, or a toothache) UNUSUAL RASH, SWELLING OR PAIN  UNUSUAL VAGINAL DISCHARGE OR ITCHING   Items with * indicate a potential emergency and should be followed up as soon as possible or go to the Emergency Department if any problems should occur.  Please show the CHEMOTHERAPY ALERT CARD or IMMUNOTHERAPY ALERT CARD at  check-in to the Emergency Department and triage nurse.  Should you have questions after your visit or need to cancel or reschedule your appointment, please contact [facility]  Dept: [phone]  and follow the prompts.  Office hours are 8:00 a.m. to 4:30 p.m. Monday - Friday. Please note that voicemails left after 4:00 p.m. may not be returned until the following business day.  We are closed weekends and major holidays. You have access to a nurse at all times for urgent questions. Please call the main number to the clinic Dept: [phone] and follow the prompts.   For any non-urgent questions, you may also contact your provider using MyChart. We now offer e-Visits for anyone 64 and older to request care online for non-urgent symptoms. For details visit [email or web address].   Also download the MyChart app! Go to the app store, search "MyChart", open the app, select [facility], and log in with your MyChart username and password.  Due to Covid, a mask is required upon entering the hospital/clinic. If you do not have a mask, one will be given to you upon arrival. For doctor visits, patients may have 1 support person aged 41 or older with them. For treatment visits, patients cannot have anyone with them due to current Covid guidelines and our immunocompromised population.

## 2021-08-21 NOTE — Progress Notes (Signed)
Per Dr Julien Nordmann ,it is okay to treat pt today with Alimta and Keytruda and heart rate of 107.

## 2021-08-22 ENCOUNTER — Other Ambulatory Visit: Payer: Self-pay | Admitting: Radiation Therapy

## 2021-08-22 ENCOUNTER — Telehealth: Payer: Self-pay | Admitting: Internal Medicine

## 2021-08-22 NOTE — Telephone Encounter (Signed)
Scheduled follow-up appointment per 11/14 los. Patient is aware.

## 2021-08-28 ENCOUNTER — Other Ambulatory Visit: Payer: 59

## 2021-09-01 ENCOUNTER — Other Ambulatory Visit (HOSPITAL_COMMUNITY): Payer: Self-pay

## 2021-09-04 ENCOUNTER — Other Ambulatory Visit: Payer: 59

## 2021-09-05 ENCOUNTER — Other Ambulatory Visit (HOSPITAL_COMMUNITY): Payer: Self-pay

## 2021-09-07 ENCOUNTER — Ambulatory Visit (HOSPITAL_COMMUNITY)
Admission: RE | Admit: 2021-09-07 | Discharge: 2021-09-07 | Disposition: A | Discharge status: Home / Self Care | Payer: 59 | Source: Ambulatory Visit | Attending: Internal Medicine | Admitting: Internal Medicine

## 2021-09-07 ENCOUNTER — Other Ambulatory Visit: Payer: Self-pay

## 2021-09-07 DIAGNOSIS — C3490 Malignant neoplasm of unspecified part of unspecified bronchus or lung: Secondary | ICD-10-CM | POA: Insufficient documentation

## 2021-09-07 DIAGNOSIS — R911 Solitary pulmonary nodule: Secondary | ICD-10-CM | POA: Diagnosis not present

## 2021-09-07 DIAGNOSIS — R918 Other nonspecific abnormal finding of lung field: Secondary | ICD-10-CM | POA: Diagnosis not present

## 2021-09-07 DIAGNOSIS — J439 Emphysema, unspecified: Secondary | ICD-10-CM | POA: Diagnosis not present

## 2021-09-07 DIAGNOSIS — K760 Fatty (change of) liver, not elsewhere classified: Secondary | ICD-10-CM | POA: Diagnosis not present

## 2021-09-07 DIAGNOSIS — I700 Atherosclerosis of aorta: Secondary | ICD-10-CM | POA: Diagnosis not present

## 2021-09-07 IMAGING — CT CT CHEST W/ CM
2 of 6 series · 12 of 36 positions shown, 15 images · IV contrast (APPLIED)
Comparison: Most recent CT chest, abdomen and pelvis [DATE].
[DATE] PET-CT.

CLINICAL DATA: Primary Cancer Type: Lung
TECHNIQUE: Multidetector CT imaging of the chest was performed during
intravenous contrast administration.

CONTRAST:  80mL OMNIPAQUE IOHEXOL 350 MG/ML SOLN, additional oral
enteric contrast

[Series 3: thins · axial · 0.83mm/px · z∈[+1194,+1756]mm · 9 of 1005 slices shown, 12 images]
[im 101/1005  mediastinal]
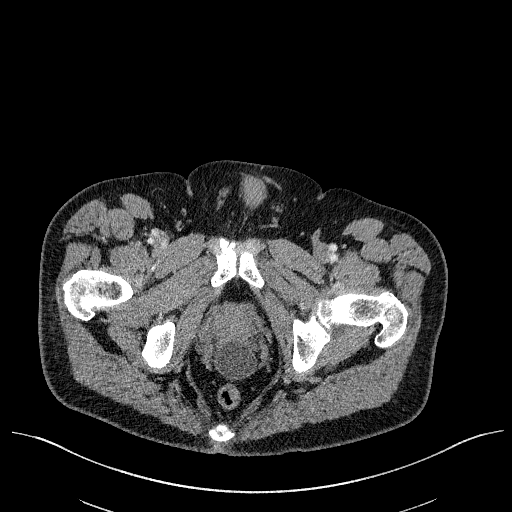
[im 101/1005  lung]
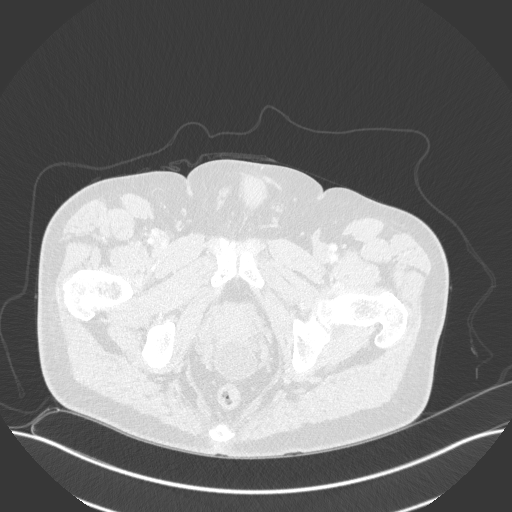
[im 201/1005  lung]
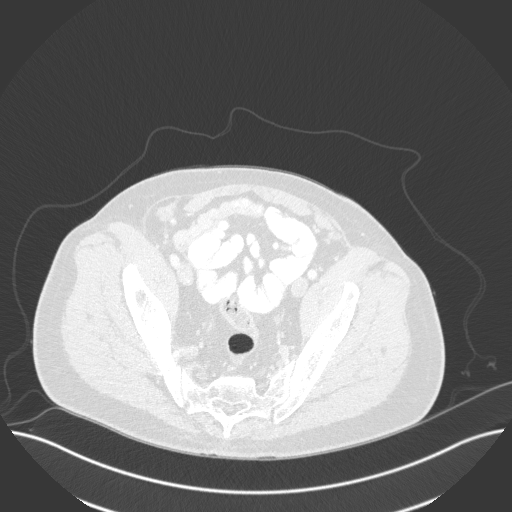
[im 302/1005  lung]
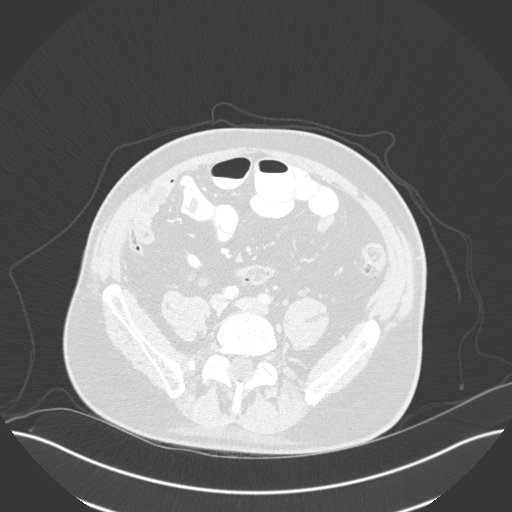
[im 402/1005  lung]
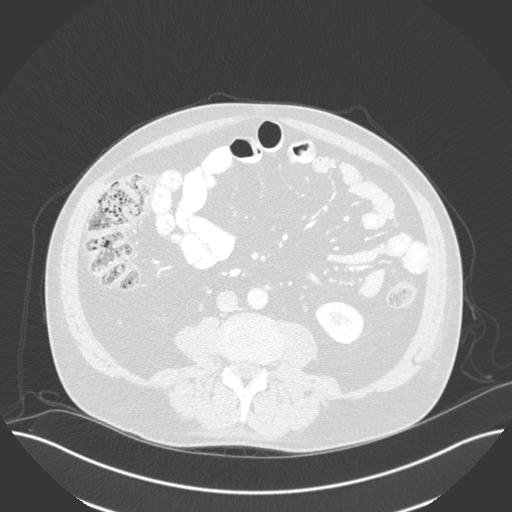
[im 503/1005  mediastinal]
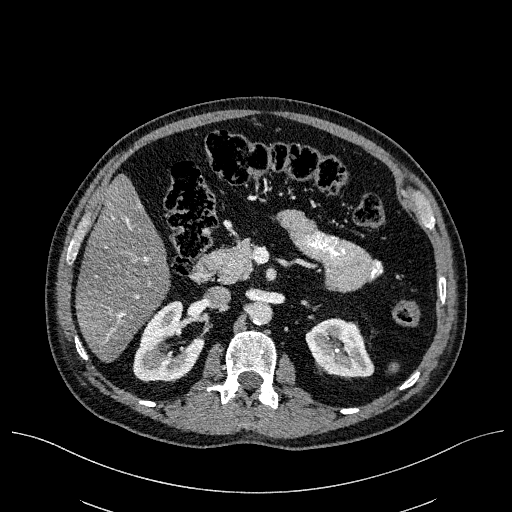
[im 503/1005  lung]
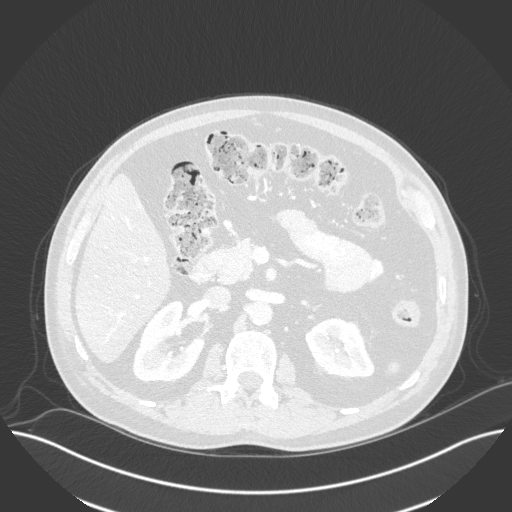
[im 603/1005  lung]
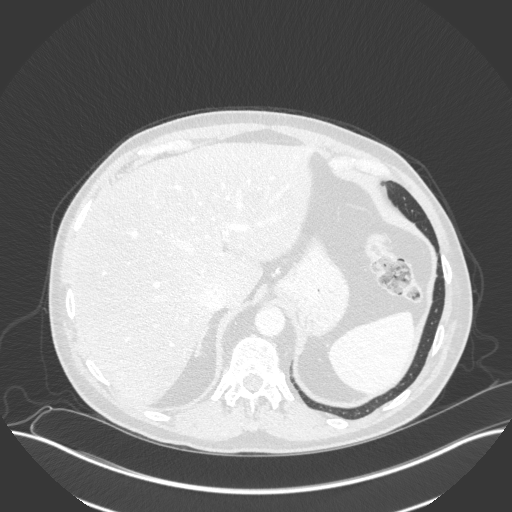
[im 703/1005  lung]
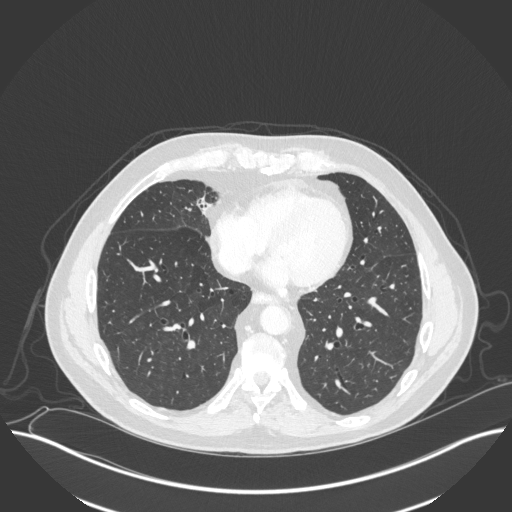
[im 804/1005  lung]
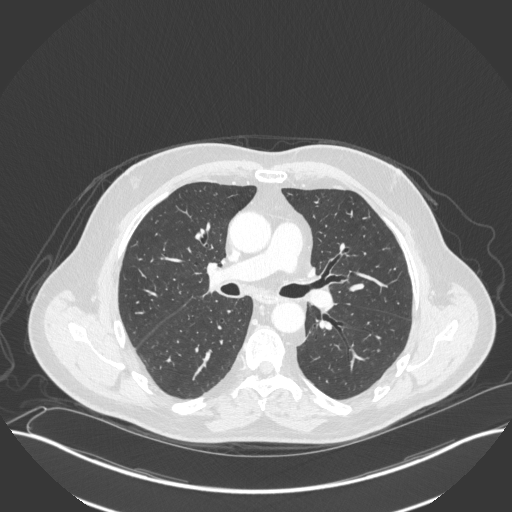
[im 904/1005  mediastinal]
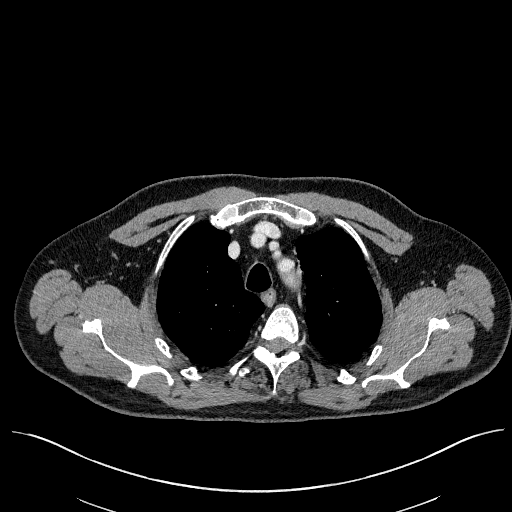
[im 904/1005  lung]
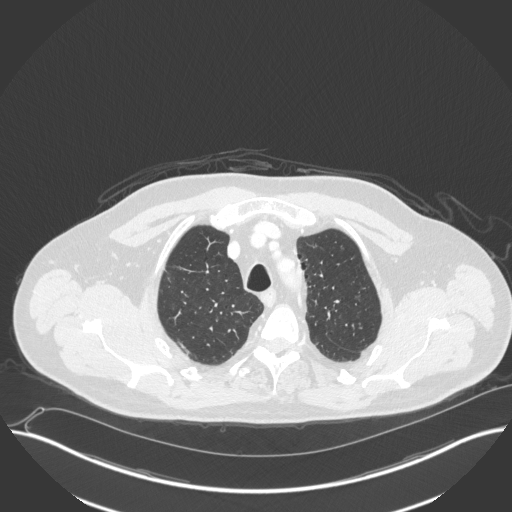

[Series 5: coronals · coronal · 1.04mm/px · 3 of 137 slices shown]
[im 28/137  lung]
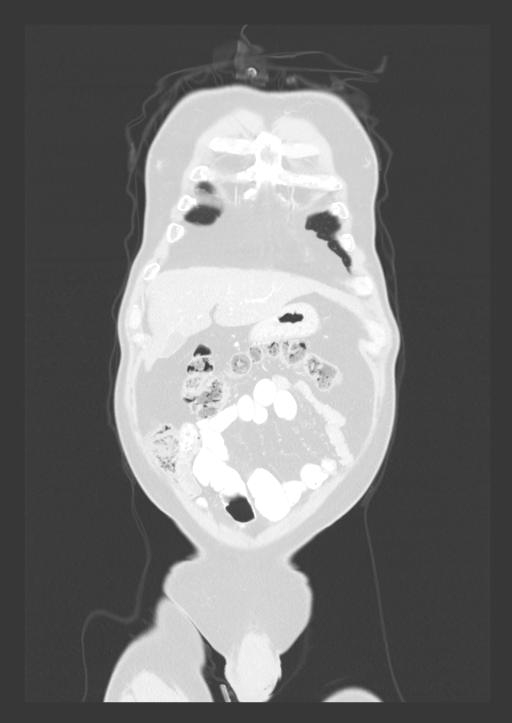
[im 55/137  lung]
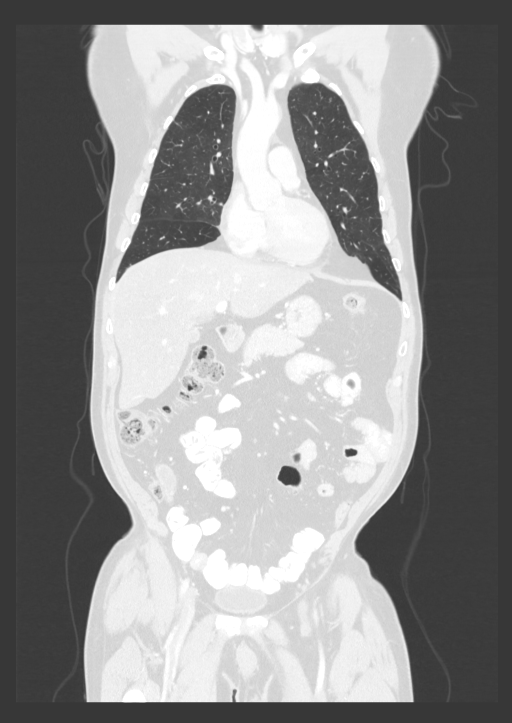
[im 82/137  lung]
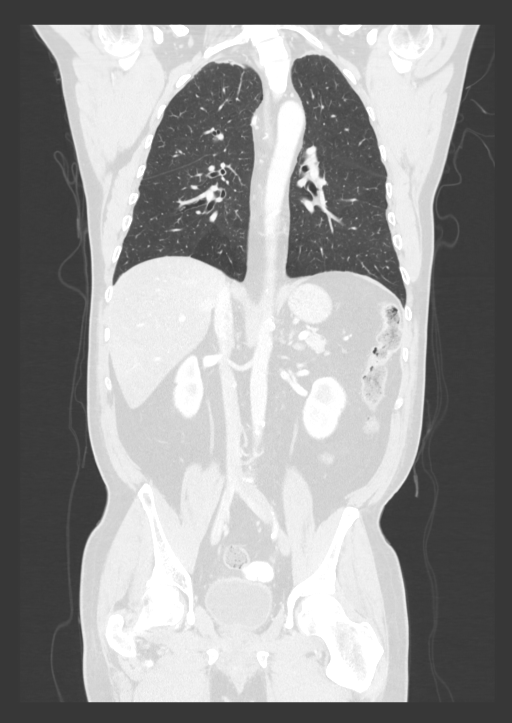

[12 of 36 positions shown; findings below may reference images not displayed]

Imaging Indication: Assess response to therapy

Interval therapy since last imaging? Yes

Initial Cancer Diagnosis

Date: [DATE]; Established by: Biopsy-proven

Detailed Pathology: Stage IV non-small cell lung cancer,
adenocarcinoma.

Primary Tumor location: Right upper lobe. Brain metastasis.

Surgeries: None.

Chemotherapy: Yes; Ongoing? Yes; Most recent administration:
[DATE]

Immunotherapy?  Yes; Type: Keytruda; Ongoing? Yes

Radiation therapy? Yes; Date Range: [DATE] - [DATE]; Target:
Brain

EXAM:
CT CHEST, ABDOMEN AND PELVIS WITH CONTRAST
FINDINGS: CT CHEST FINDINGS

Cardiovascular: Aortic atherosclerosis. Normal heart size. Left
coronary artery calcifications. No pericardial effusion.

Mediastinum/Nodes: Continued decrease in size of mediastinal lymph
nodes, subcarinal node measuring 2.0 x 0.6 cm, previously 2.2 x
cm (series 2, image 30). There are no persistently enlarged lymph
nodes in the mediastinum. Point thyroid gland, trachea, and
esophagus demonstrate no significant findings.

Lungs/Pleura: Mild paraseptal emphysema. Background of very fine
centrilobular nodularity, most concentrated in the lung apices.
Continued interval decrease in size of a spiculated nodule of the
peripheral right upper lobe, now almost completely bandlike and
measuring no greater than 0.8 x 0.4 cm, previously 0.9 x 0.6 cm
(series 4, image 38). Occasional scattered ground-glass nodules are
unchanged, for example a 0.7 cm nodule of the right upper lobe
(series 4, image 55) and a 0.6 cm nodule of the peripheral left
upper lobe (series 4, image 68). No pleural effusion or
pneumothorax.

Musculoskeletal: No chest wall mass or suspicious bone lesions
identified.

CT ABDOMEN PELVIS FINDINGS

Hepatobiliary: There is a new, somewhat ill-defined hypodense lesion
of the left lobe of the liver, hepatic segment II, measuring 1.7 x
1.0 cm (series 2, image 53). Unchanged subcentimeter low-attenuation
lesion of the inferior right lobe of the liver, hepatic segment VI
(series 2, image 76). Hepatic steatosis. No gallstones, gallbladder
wall thickening, or biliary dilatation.

Pancreas: Unremarkable. No pancreatic ductal dilatation or
surrounding inflammatory changes.

Spleen: Normal in size without significant abnormality.

Adrenals/Urinary Tract: Adrenal glands are unremarkable.
Nonobstructive calculus of the superior pole of the left kidney. No
right-sided calculi, ureteral calculi, or hydronephrosis. Bladder is
unremarkable.

Stomach/Bowel: Stomach is within normal limits. Appendix appears
normal. No evidence of bowel wall thickening, distention, or
inflammatory changes. Descending colonic diverticulosis.

Vascular/Lymphatic: Aortic atherosclerosis. No enlarged abdominal or
pelvic lymph nodes.

Reproductive: No mass or other abnormality.

Other: No abdominal wall hernia or abnormality. No abdominopelvic
ascites.

Musculoskeletal: No acute or significant osseous findings.
IMPRESSION: 1. Continued interval decrease in size of a spiculated nodule of the
peripheral right upper lobe, now almost completely bandlike.
2. Continued decrease in size of mediastinal lymph nodes. There are
no persistently enlarged lymph nodes in the mediastinum.
3. There is however a new hypodense lesion of the left lobe of the
liver, worrisome for a new metastasis. This could be more completely
characterized by multiphasic contrast enhanced MRI.
4. Occasional scattered ground-glass pulmonary nodules are unchanged
and nonspecific. Continued attention on follow-up.
5. Mild emphysema. Background of very fine centrilobular nodularity,
most concentrated in the lung apices, most commonly seen in
smoking-related respiratory bronchiolitis.
6. Coronary artery disease.
7. Hepatic steatosis.
8. Nonobstructive left nephrolithiasis.

These results will be called to the ordering clinician or
representative by the Radiologist Assistant, and communication
documented in the PACS or [REDACTED].

Aortic Atherosclerosis ([G6]-[G6]) and Emphysema ([G6]-[G6]).

## 2021-09-07 IMAGING — CT CT ABD-PELV W/ CM
2 of 5 series · 12 of 36 positions shown, 15 images · IV contrast (APPLIED)
Comparison: Most recent CT chest, abdomen and pelvis [DATE].
[DATE] PET-CT.

CLINICAL DATA: Primary Cancer Type: Lung
TECHNIQUE: Multidetector CT imaging of the chest was performed during
intravenous contrast administration.

CONTRAST:  80mL OMNIPAQUE IOHEXOL 350 MG/ML SOLN, additional oral
enteric contrast

[Series 3: thins · axial · 0.83mm/px · z∈[+1190,+1760]mm · 9 of 1005 slices shown, 12 images]
[im 96/1005  mediastinal]
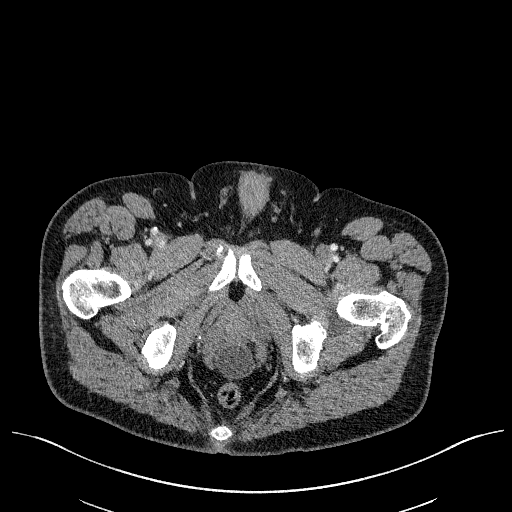
[im 96/1005  lung]
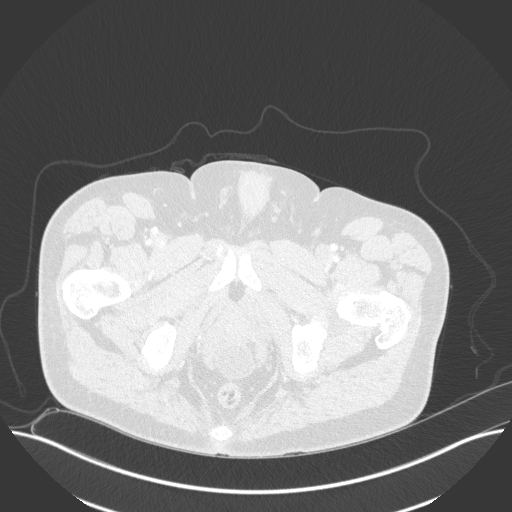
[im 192/1005  lung]
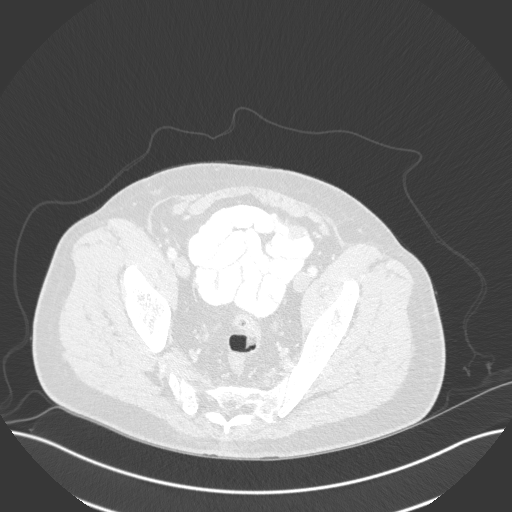
[im 287/1005  lung]
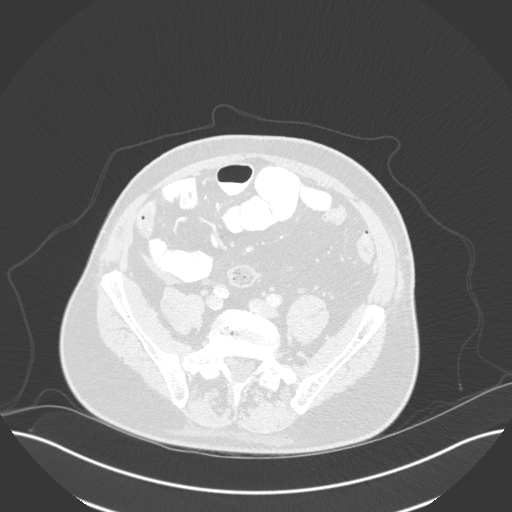
[im 383/1005  lung]
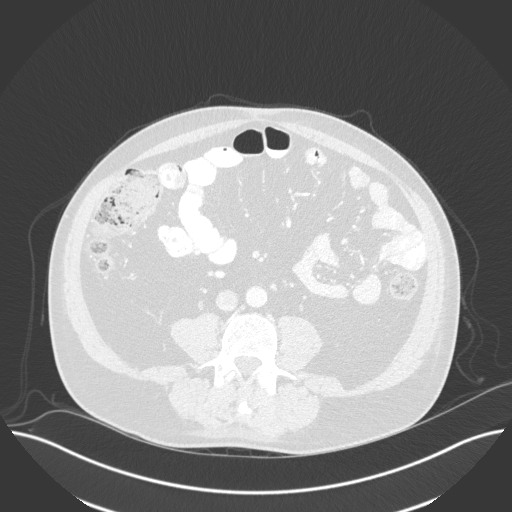
[im 526/1005  mediastinal]
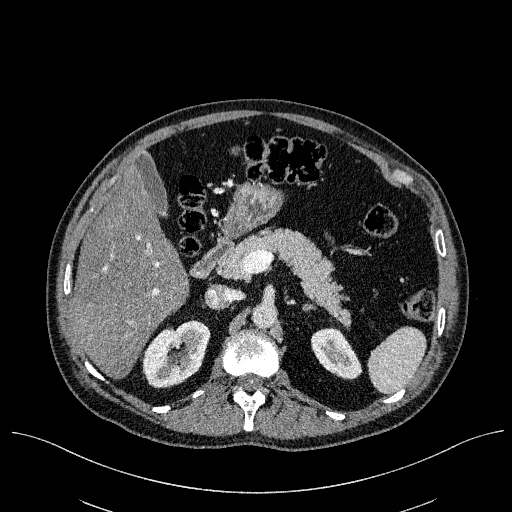
[im 526/1005  lung]
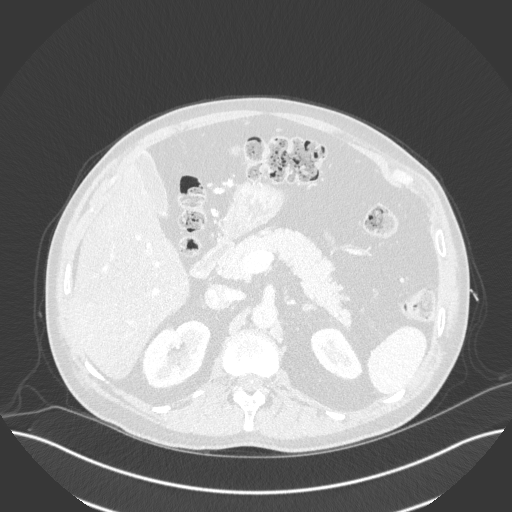
[im 622/1005  lung]
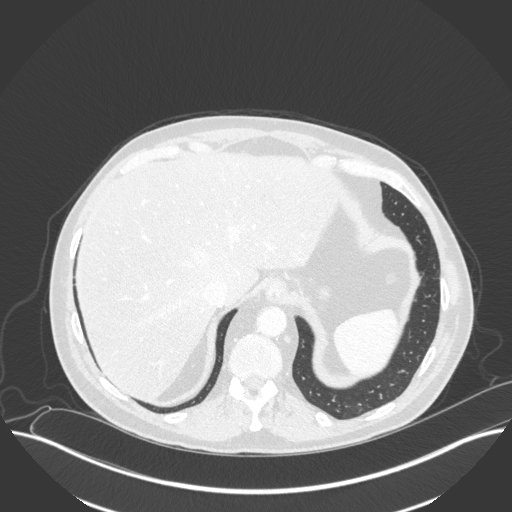
[im 718/1005  lung]
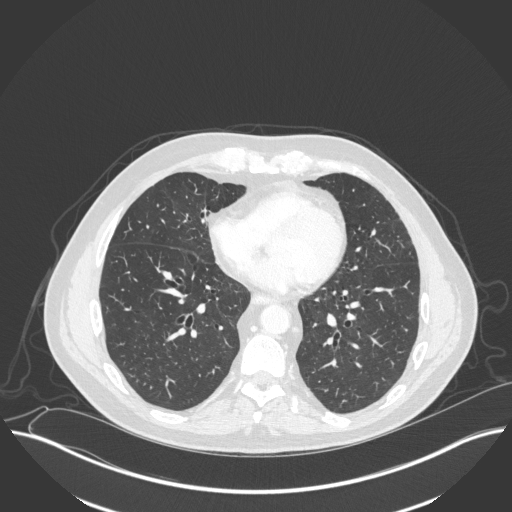
[im 813/1005  lung]
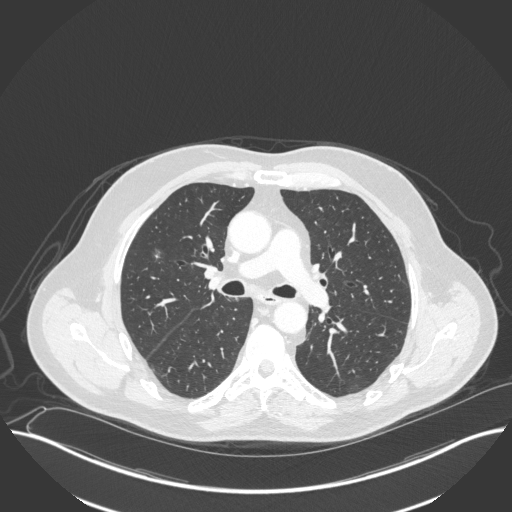
[im 909/1005  mediastinal]
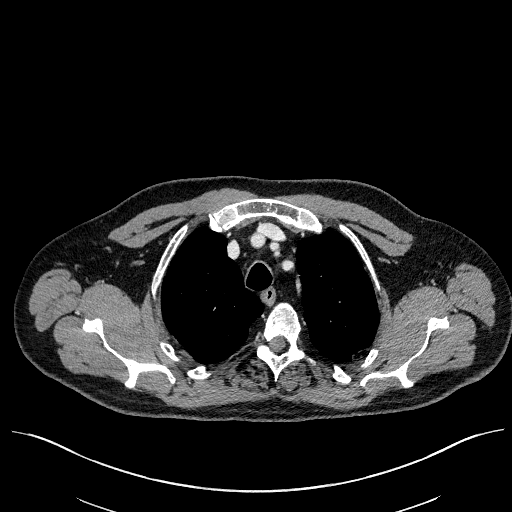
[im 909/1005  lung]
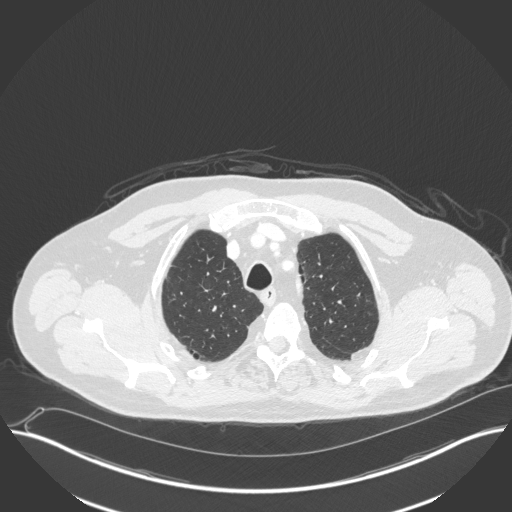

[Series 5: coronals · coronal · 1.04mm/px · 3 of 137 slices shown]
[im 28/137  lung]
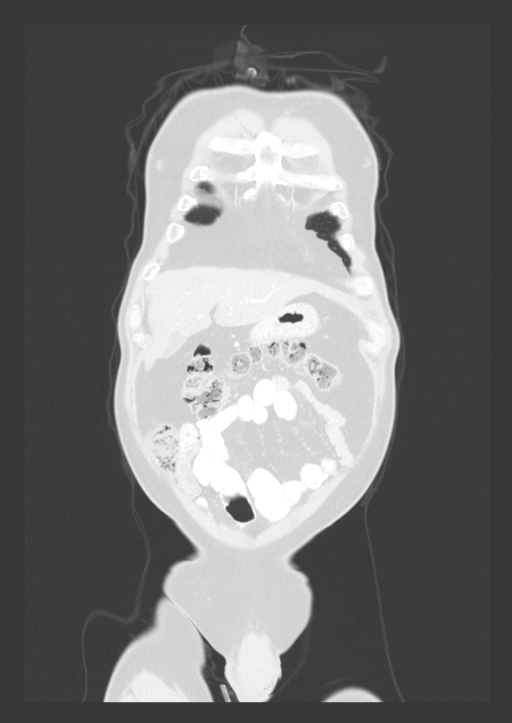
[im 55/137  lung]
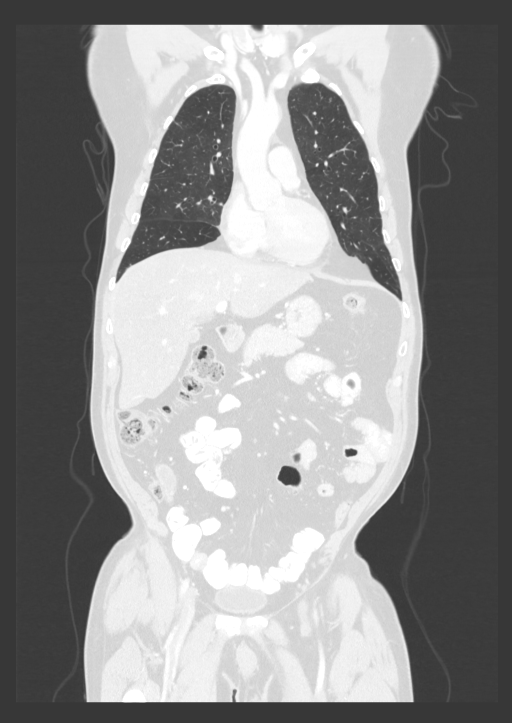
[im 82/137  lung]
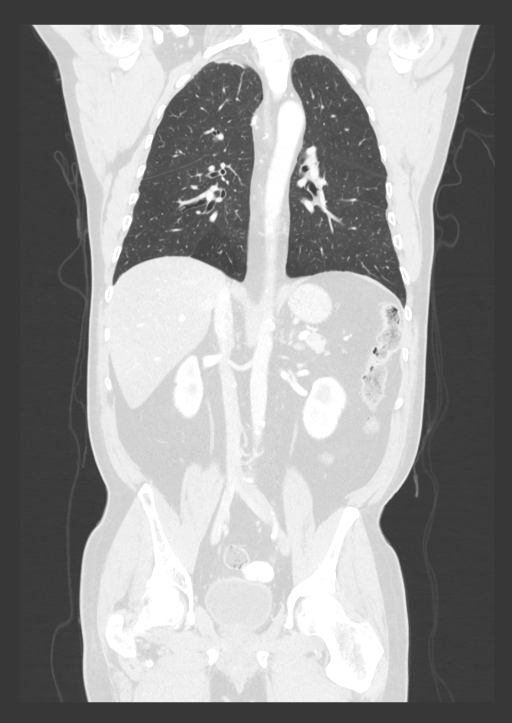

[12 of 36 positions shown; findings below may reference images not displayed]

Imaging Indication: Assess response to therapy

Interval therapy since last imaging? Yes

Initial Cancer Diagnosis

Date: [DATE]; Established by: Biopsy-proven

Detailed Pathology: Stage IV non-small cell lung cancer,
adenocarcinoma.

Primary Tumor location: Right upper lobe. Brain metastasis.

Surgeries: None.

Chemotherapy: Yes; Ongoing? Yes; Most recent administration:
[DATE]

Immunotherapy?  Yes; Type: Keytruda; Ongoing? Yes

Radiation therapy? Yes; Date Range: [DATE] - [DATE]; Target:
Brain

EXAM:
CT CHEST, ABDOMEN AND PELVIS WITH CONTRAST
FINDINGS: CT CHEST FINDINGS

Cardiovascular: Aortic atherosclerosis. Normal heart size. Left
coronary artery calcifications. No pericardial effusion.

Mediastinum/Nodes: Continued decrease in size of mediastinal lymph
nodes, subcarinal node measuring 2.0 x 0.6 cm, previously 2.2 x
cm (series 2, image 30). There are no persistently enlarged lymph
nodes in the mediastinum. Point thyroid gland, trachea, and
esophagus demonstrate no significant findings.

Lungs/Pleura: Mild paraseptal emphysema. Background of very fine
centrilobular nodularity, most concentrated in the lung apices.
Continued interval decrease in size of a spiculated nodule of the
peripheral right upper lobe, now almost completely bandlike and
measuring no greater than 0.8 x 0.4 cm, previously 0.9 x 0.6 cm
(series 4, image 38). Occasional scattered ground-glass nodules are
unchanged, for example a 0.7 cm nodule of the right upper lobe
(series 4, image 55) and a 0.6 cm nodule of the peripheral left
upper lobe (series 4, image 68). No pleural effusion or
pneumothorax.

Musculoskeletal: No chest wall mass or suspicious bone lesions
identified.

CT ABDOMEN PELVIS FINDINGS

Hepatobiliary: There is a new, somewhat ill-defined hypodense lesion
of the left lobe of the liver, hepatic segment II, measuring 1.7 x
1.0 cm (series 2, image 53). Unchanged subcentimeter low-attenuation
lesion of the inferior right lobe of the liver, hepatic segment VI
(series 2, image 76). Hepatic steatosis. No gallstones, gallbladder
wall thickening, or biliary dilatation.

Pancreas: Unremarkable. No pancreatic ductal dilatation or
surrounding inflammatory changes.

Spleen: Normal in size without significant abnormality.

Adrenals/Urinary Tract: Adrenal glands are unremarkable.
Nonobstructive calculus of the superior pole of the left kidney. No
right-sided calculi, ureteral calculi, or hydronephrosis. Bladder is
unremarkable.

Stomach/Bowel: Stomach is within normal limits. Appendix appears
normal. No evidence of bowel wall thickening, distention, or
inflammatory changes. Descending colonic diverticulosis.

Vascular/Lymphatic: Aortic atherosclerosis. No enlarged abdominal or
pelvic lymph nodes.

Reproductive: No mass or other abnormality.

Other: No abdominal wall hernia or abnormality. No abdominopelvic
ascites.

Musculoskeletal: No acute or significant osseous findings.
IMPRESSION: 1. Continued interval decrease in size of a spiculated nodule of the
peripheral right upper lobe, now almost completely bandlike.
2. Continued decrease in size of mediastinal lymph nodes. There are
no persistently enlarged lymph nodes in the mediastinum.
3. There is however a new hypodense lesion of the left lobe of the
liver, worrisome for a new metastasis. This could be more completely
characterized by multiphasic contrast enhanced MRI.
4. Occasional scattered ground-glass pulmonary nodules are unchanged
and nonspecific. Continued attention on follow-up.
5. Mild emphysema. Background of very fine centrilobular nodularity,
most concentrated in the lung apices, most commonly seen in
smoking-related respiratory bronchiolitis.
6. Coronary artery disease.
7. Hepatic steatosis.
8. Nonobstructive left nephrolithiasis.

These results will be called to the ordering clinician or
representative by the Radiologist Assistant, and communication
documented in the PACS or [REDACTED].

Aortic Atherosclerosis ([G6]-[G6]) and Emphysema ([G6]-[G6]).

## 2021-09-07 MED ORDER — IOHEXOL 350 MG/ML SOLN
80.0000 mL | Freq: Once | INTRAVENOUS | Status: AC | PRN
  Administered 2021-09-07: 80 mL via INTRAVENOUS

## 2021-09-07 MED ORDER — SODIUM CHLORIDE (PF) 0.9 % IJ SOLN
INTRAMUSCULAR | Status: AC
  Filled 2021-09-07: qty 50

## 2021-09-08 ENCOUNTER — Telehealth: Payer: Self-pay

## 2021-09-08 NOTE — Telephone Encounter (Signed)
Call received from [facility] regarding pts CT of chest/Abdomen/Pelvis, highlighting impression:  3. There is however a new hypodense lesion of the left lobe of the liver, worrisome for a new metastasis. This could be more completely characterized by multiphasic contrast enhanced MRI.

## 2021-09-11 ENCOUNTER — Other Ambulatory Visit: Payer: Self-pay | Admitting: Internal Medicine

## 2021-09-11 ENCOUNTER — Other Ambulatory Visit: Payer: Self-pay

## 2021-09-11 ENCOUNTER — Other Ambulatory Visit (HOSPITAL_COMMUNITY): Payer: Self-pay

## 2021-09-11 ENCOUNTER — Inpatient Hospital Stay: Payer: 59 | Attending: Physician Assistant | Admitting: Internal Medicine

## 2021-09-11 ENCOUNTER — Inpatient Hospital Stay: Payer: 59

## 2021-09-11 ENCOUNTER — Other Ambulatory Visit: Payer: 59

## 2021-09-11 VITALS — BP 143/97 | HR 108 | Temp 97.9°F | Resp 20 | Ht 73.0 in | Wt 185.6 lb

## 2021-09-11 DIAGNOSIS — C3491 Malignant neoplasm of unspecified part of right bronchus or lung: Secondary | ICD-10-CM

## 2021-09-11 DIAGNOSIS — C7931 Secondary malignant neoplasm of brain: Secondary | ICD-10-CM | POA: Diagnosis not present

## 2021-09-11 DIAGNOSIS — F17210 Nicotine dependence, cigarettes, uncomplicated: Secondary | ICD-10-CM | POA: Diagnosis not present

## 2021-09-11 DIAGNOSIS — C3411 Malignant neoplasm of upper lobe, right bronchus or lung: Secondary | ICD-10-CM | POA: Diagnosis not present

## 2021-09-11 DIAGNOSIS — Z5112 Encounter for antineoplastic immunotherapy: Secondary | ICD-10-CM | POA: Insufficient documentation

## 2021-09-11 DIAGNOSIS — Z923 Personal history of irradiation: Secondary | ICD-10-CM | POA: Insufficient documentation

## 2021-09-11 DIAGNOSIS — Z5111 Encounter for antineoplastic chemotherapy: Secondary | ICD-10-CM | POA: Insufficient documentation

## 2021-09-11 DIAGNOSIS — C7951 Secondary malignant neoplasm of bone: Secondary | ICD-10-CM | POA: Insufficient documentation

## 2021-09-11 DIAGNOSIS — K769 Liver disease, unspecified: Secondary | ICD-10-CM

## 2021-09-11 DIAGNOSIS — Z79899 Other long term (current) drug therapy: Secondary | ICD-10-CM | POA: Insufficient documentation

## 2021-09-11 LAB — CMP (CANCER CENTER ONLY)
ALT: 50 U/L — ABNORMAL HIGH (ref 0–44)
AST: 27 U/L (ref 15–41)
Albumin: 3.8 g/dL (ref 3.5–5.0)
Alkaline Phosphatase: 89 U/L (ref 38–126)
Anion gap: 11 (ref 5–15)
BUN: 11 mg/dL (ref 8–23)
CO2: 26 mmol/L (ref 22–32)
Calcium: 9 mg/dL (ref 8.9–10.3)
Chloride: 104 mmol/L (ref 98–111)
Creatinine: 1.08 mg/dL (ref 0.61–1.24)
GFR, Estimated: >60 mL/min (ref >60)
Glucose, Bld: 243 mg/dL — ABNORMAL HIGH (ref 70–99)
Potassium: 3.7 mmol/L (ref 3.5–5.1)
Sodium: 141 mmol/L (ref 135–145)
Total Bilirubin: 0.2 mg/dL — ABNORMAL LOW (ref 0.3–1.2)
Total Protein: 6.8 g/dL (ref 6.5–8.1)

## 2021-09-11 LAB — CBC WITH DIFFERENTIAL (CANCER CENTER ONLY)
Abs Immature Granulocytes: 0.09 10*3/uL — ABNORMAL HIGH (ref 0.00–0.07)
Basophils Absolute: 0 10*3/uL (ref 0.0–0.1)
Basophils Relative: 0 %
Eosinophils Absolute: 0 10*3/uL (ref 0.0–0.5)
Eosinophils Relative: 0 %
HCT: 42.1 % (ref 39.0–52.0)
Hemoglobin: 13.4 g/dL (ref 13.0–17.0)
Immature Granulocytes: 1 %
Lymphocytes Relative: 11 %
Lymphs Abs: 1.2 10*3/uL (ref 0.7–4.0)
MCH: 31.8 pg (ref 26.0–34.0)
MCHC: 31.8 g/dL (ref 30.0–36.0)
MCV: 99.8 fL (ref 80.0–100.0)
Monocytes Absolute: 0.5 10*3/uL (ref 0.1–1.0)
Monocytes Relative: 5 %
Neutro Abs: 9 10*3/uL — ABNORMAL HIGH (ref 1.7–7.7)
Neutrophils Relative %: 83 %
Platelet Count: 253 10*3/uL (ref 150–400)
RBC: 4.22 MIL/uL (ref 4.22–5.81)
RDW: 15.2 % (ref 11.5–15.5)
WBC Count: 10.9 10*3/uL — ABNORMAL HIGH (ref 4.0–10.5)
nRBC: 0 % (ref 0.0–0.2)

## 2021-09-11 LAB — TSH: TSH: 1.032 u[IU]/mL (ref 0.320–4.118)

## 2021-09-11 MED ORDER — SODIUM CHLORIDE 0.9 % IV SOLN
500.0000 mg/m2 | Freq: Once | INTRAVENOUS | Status: AC
  Administered 2021-09-11: 1000 mg via INTRAVENOUS
  Filled 2021-09-11: qty 40

## 2021-09-11 MED ORDER — SODIUM CHLORIDE 0.9 % IV SOLN: Freq: Once | INTRAVENOUS | Status: AC

## 2021-09-11 MED ORDER — FOLIC ACID 1 MG PO TABS
1.0000 mg | ORAL_TABLET | Freq: Every day | ORAL | 4 refills | Status: DC
  Filled 2021-09-11: qty 30, 30d supply, fill #0
  Filled 2021-10-04: qty 90, 90d supply, fill #0
  Filled 2022-01-03: qty 30, 30d supply, fill #1
  Filled 2022-02-01: qty 30, 30d supply, fill #2

## 2021-09-11 MED ORDER — SODIUM CHLORIDE 0.9 % IV SOLN
200.0000 mg | Freq: Once | INTRAVENOUS | Status: AC
  Administered 2021-09-11: 200 mg via INTRAVENOUS
  Filled 2021-09-11: qty 8

## 2021-09-11 MED ORDER — PROCHLORPERAZINE MALEATE 10 MG PO TABS
10.0000 mg | ORAL_TABLET | Freq: Once | ORAL | Status: AC
  Administered 2021-09-11: 10 mg via ORAL
  Filled 2021-09-11: qty 1

## 2021-09-11 NOTE — Progress Notes (Signed)
Per Dr. Julien Nordmann ,it is okay to treat pt today with Alimta and Keytruda and heart rate of [phone].

## 2021-09-11 NOTE — Progress Notes (Signed)
[name] Telephone:([phone]) [phone]   [phone]  OFFICE PROGRESS NOTE  Bradley Goodman, [place] [address]  DIAGNOSIS: Stage IV (T1b, N3, M1C) non-small cell lung cancer, favoring adenocarcinoma presented with right upper lobe lung nodule in addition to right hilar, subcarinal and bilateral mediastinal as well as supraclavicular lymphadenopathy in addition to bone and brain metastasis diagnosed in June 2022.     PD-L1 expression 80%.     Molecular Studies:  Biomarker Findings Microsatellite status - MS-Stable Tumor Mutational Burden - 6 Muts/Mb Genomic Findings For a complete list of the genes assayed, please refer to the Appendix. KRAS G12C, amplification ATM S470* CCND1 amplification - equivocal? HGF amplification - equivocal? MYC amplification - equivocal? FGF19 amplification - equivocal? FGF3 amplification - equivocal? FGF4 amplification - equivocal? NFKBIA amplification NKX2-1 amplification RAD21 amplification - equivocal? [id number]*26 TERT promoter -124C>T TP53 rearrangement exon 9 7 Disease relevant genes with no reportable alterations: ALK, BRAF, EGFR, ERBB2, MET, RET, ROS1   PRIOR THERAPY: SRS to the metastatic brain lesions under the care of Dr. MTammi Klippel  Last treatment on 05/04/2021.   CURRENT THERAPY: Palliative systemic chemotherapy with carboplatin for an AUC 5, Alimta 500 mg/m2 and, Keytruda 200 mg IV every 3 weeks.  First dose expected on 05/08/2021.  Status post 6 cycles.  Starting from cycle #5 he will be on maintenance treatment with Alimta and Keytruda every 3 weeks.  INTERVAL HISTORY: Bradley Goodman 65y.o. male returns to the clinic today for follow-up visit accompanied by his wife.  The patient is feeling fine today with no concerning complaints.  He denied having any current chest pain, shortness of breath, cough or hemoptysis.  He denied having any fever or chills.  He has no nausea,  vomiting, diarrhea or constipation.  He has no headache or visual changes.  The patient has no significant weight loss or night sweats.  He continues to tolerate his treatment with maintenance Alimta and Keytruda fairly well.  He had repeat CT scan of the chest, abdomen pelvis performed recently and he is here for evaluation and discussion of his discuss results.   MEDICAL HISTORY: Past Medical History:  Diagnosis Date   Cervical spondylolysis    Essential hypertension    GERD (gastroesophageal reflux disease)    History of kidney stones    History of migraine    Hyperlipidemia    Hypertension    PONV (postoperative nausea and vomiting)    Type 2 diabetes mellitus (HCC)     ALLERGIES:  has No Known Allergies.  MEDICATIONS:  Current Outpatient Medications  Medication Sig Dispense Refill   cholecalciferol (VITAMIN D) 1000 UNITS tablet Take 1,000 Units by mouth every evening.     dexamethasone (DECADRON) 1 MG tablet Take 2 tablets (2 mg total) by mouth daily. 180 tablet 1   docusate sodium (COLACE) 100 MG capsule Take 100 mg by mouth daily.     folic acid (FOLVITE) 1 MG tablet Take 1 tablet (1 mg total) by mouth daily. 30 tablet 4   Krill Oil 300 MG CAPS Take 300 mg by mouth every evening.     levothyroxine (SYNTHROID) 25 MCG tablet Take 1 tablet (25 mcg total) by mouth daily before breakfast. 90 tablet 1   loratadine (CLARITIN) 10 MG tablet Take 10 mg by mouth every evening.      losartan (COZAAR) 50 MG tablet Take 1 and 1/2 tablets (75 mg total) by mouth daily.  135 tablet 1   magic mouthwash (nystatin, diphenhydrAMINE, alum & mag hydroxide) suspension mixture Swish and spit 5 mls up to 4 times daily as needed. (Patient not taking: Reported on 08/21/2021) 240 mL 1   mirtazapine (REMERON) 15 MG tablet Take 1 tablet (15 mg total) by mouth at bedtime. 90 tablet 1   Multiple Vitamins-Minerals (MULTIVITAMIN GUMMIES ADULT PO) Take 2 each by mouth daily.     pantoprazole (PROTONIX) 40 MG  tablet Take 1 tablet by mouth every evening. 30 tablet 5   prochlorperazine (COMPAZINE) 10 MG tablet Take 1 tablet (10 mg total) by mouth every 6 (six) hours as needed for nausea or vomiting. (Patient not taking: Reported on 08/21/2021) 30 tablet 0   rosuvastatin (CRESTOR) 20 MG tablet Take 1 tablet (20 mg total) by mouth daily. 90 tablet 1   SYRINGE-NEEDLE, DISP, 3 ML 21G X 1-1/2" 3 ML MISC Use to inject testosterone every week 50 each 0   testosterone cypionate (DEPO-TESTOSTERONE) 100 MG/ML injection Inject 0.5 mLs (50 mg total) into the muscle every 7 (seven) days. For IM use only 10 mL 0   venlafaxine XR (EFFEXOR-XR) 75 MG 24 hr capsule Take 1 capsule (75 mg total) by mouth daily with breakfast. 90 capsule 1   No current facility-administered medications for this visit.    SURGICAL HISTORY:  Past Surgical History:  Procedure Laterality Date   Bilateral inguinal hernia repair     BRONCHIAL NEEDLE ASPIRATION BIOPSY  04/05/2021   Procedure: BRONCHIAL NEEDLE ASPIRATION BIOPSIES;  Surgeon: Garner Nash, DO;  Location: [facility];  Service: Pulmonary;;   COLONOSCOPY  01/23/2012   Procedure: COLONOSCOPY;  Surgeon: Daneil Dolin, MD;  [facility];  Service: Endoscopy;  Laterality: N/A;  9:30 AM   COLONOSCOPY N/A 10/11/2015   Procedure: COLONOSCOPY;  Surgeon: Daneil Dolin, MD;  [facility];  Service: Endoscopy;  Laterality: N/A;  830   COLONOSCOPY N/A 10/14/2019   Procedure: COLONOSCOPY;  Surgeon: Daneil Dolin, MD;  [facility];  Service: Endoscopy;  Laterality: N/A;  1:45   POLYPECTOMY  10/14/2019   Procedure: POLYPECTOMY;  Surgeon: Daneil Dolin, MD;  [facility];  Service: Endoscopy;;  ascending colon, descending colon   VIDEO BRONCHOSCOPY WITH ENDOBRONCHIAL ULTRASOUND N/A 04/05/2021   Procedure: VIDEO BRONCHOSCOPY WITH ENDOBRONCHIAL ULTRASOUND;  Surgeon: Garner Nash, DO;  Location: [facility];  Service: Pulmonary;  Laterality: N/A;     REVIEW OF SYSTEMS:  Constitutional: negative Eyes: negative Ears, nose, mouth, throat, and face: negative Respiratory: negative Cardiovascular: negative Gastrointestinal: negative Genitourinary:negative Integument/breast: negative Hematologic/lymphatic: negative Musculoskeletal:negative Neurological: negative Behavioral/Psych: negative Endocrine: negative Allergic/Immunologic: negative   PHYSICAL EXAMINATION: General appearance: alert, cooperative, appears stated age, and no distress Head: Normocephalic, without obvious abnormality, atraumatic Neck: no adenopathy, no JVD, supple, symmetrical, trachea midline, and thyroid not enlarged, symmetric, no tenderness/mass/nodules Lymph nodes: Cervical, supraclavicular, and axillary nodes normal. Resp: clear to auscultation bilaterally Back: symmetric, no curvature. ROM normal. No CVA tenderness. Cardio: regular rate and rhythm, S1, S2 normal, no murmur, click, rub or gallop GI: soft, non-tender; bowel sounds normal; no masses,  no organomegaly Extremities: extremities normal, atraumatic, no cyanosis or edema Neurologic: Alert and oriented X 3, normal strength and tone. Normal symmetric reflexes. Normal coordination and gait  ECOG PERFORMANCE STATUS: 1 - Symptomatic but completely ambulatory  Blood pressure (!) 143/97, pulse (!) 108, temperature 97.9 F (36.6 C), temperature source Tympanic, resp. rate 20, height '6\' 1"'  (1.854  m), weight 185 lb 9.6 oz (84.2 kg), SpO2 99 %.  LABORATORY DATA: Lab Results  Component Value Date   WBC 10.9 (H) 09/11/2021   HGB 13.4 09/11/2021   HCT 42.1 09/11/2021   MCV 99.8 09/11/2021   PLT 253 09/11/2021      Chemistry      Component Value Date/Time   NA 142 08/21/2021 1325   NA 144 01/10/2021 0849   K 4.0 08/21/2021 1325   CL 106 08/21/2021 1325   CO2 24 08/21/2021 1325   BUN 9 08/21/2021 1325   BUN 15 01/10/2021 0849   CREATININE 0.93 08/21/2021 1325      Component Value Date/Time    CALCIUM 9.2 08/21/2021 1325   ALKPHOS 80 08/21/2021 1325   AST 27 08/21/2021 1325   ALT 52 (H) 08/21/2021 1325   BILITOT 0.3 08/21/2021 1325       RADIOGRAPHIC STUDIES: CT Chest W Contrast  Result Date: 09/08/2021 CLINICAL DATA:  Primary Cancer Type: Lung Imaging Indication: Assess response to therapy Interval therapy since last imaging? Yes Initial Cancer Diagnosis Date: 04/05/2021; Established by: Biopsy-proven Detailed Pathology: Stage IV non-small cell lung cancer, adenocarcinoma. Primary Tumor location: Right upper lobe. Brain metastasis. Surgeries: None. Chemotherapy: Yes; Ongoing? Yes; Most recent administration: 08/21/2021 Immunotherapy?  Yes; Type: Keytruda; Ongoing? Yes Radiation therapy? Yes; Date Range: 04/24/2021 - 05/04/2021; Target: Brain EXAM: CT CHEST, ABDOMEN AND PELVIS WITH CONTRAST TECHNIQUE: Multidetector CT imaging of the chest was performed during intravenous contrast administration. CONTRAST:  17m OMNIPAQUE IOHEXOL 350 MG/ML SOLN, additional oral enteric contrast COMPARISON:  Most recent CT chest, abdomen and pelvis 07/05/2021. 04/07/2021 PET-CT. FINDINGS: CT CHEST FINDINGS Cardiovascular: Aortic atherosclerosis. Normal heart size. Left coronary artery calcifications. No pericardial effusion. Mediastinum/Nodes: Continued decrease in size of mediastinal lymph nodes, subcarinal node measuring 2.0 x 0.6 cm, previously 2.2 x 1.0 cm (series 2, image 30). There are no persistently enlarged lymph nodes in the mediastinum. Point thyroid gland, trachea, and esophagus demonstrate no significant findings. Lungs/Pleura: Mild paraseptal emphysema. Background of very fine centrilobular nodularity, most concentrated in the lung apices. Continued interval decrease in size of a spiculated nodule of the peripheral right upper lobe, now almost completely bandlike and measuring no greater than 0.8 x 0.4 cm, previously 0.9 x 0.6 cm (series 4, image 38). Occasional scattered ground-glass nodules are  unchanged, for example a 0.7 cm nodule of the right upper lobe (series 4, image 55) and a 0.6 cm nodule of the peripheral left upper lobe (series 4, image 68). No pleural effusion or pneumothorax. Musculoskeletal: No chest wall mass or suspicious bone lesions identified. CT ABDOMEN PELVIS FINDINGS Hepatobiliary: There is a new, somewhat ill-defined hypodense lesion of the left lobe of the liver, hepatic segment II, measuring 1.7 x 1.0 cm (series 2, image 53). Unchanged subcentimeter low-attenuation lesion of the inferior right lobe of the liver, hepatic segment VI (series 2, image 76). Hepatic steatosis. No gallstones, gallbladder wall thickening, or biliary dilatation. Pancreas: Unremarkable. No pancreatic ductal dilatation or surrounding inflammatory changes. Spleen: Normal in size without significant abnormality. Adrenals/Urinary Tract: Adrenal glands are unremarkable. Nonobstructive calculus of the superior pole of the left kidney. No right-sided calculi, ureteral calculi, or hydronephrosis. Bladder is unremarkable. Stomach/Bowel: Stomach is within normal limits. Appendix appears normal. No evidence of bowel wall thickening, distention, or inflammatory changes. Descending colonic diverticulosis. Vascular/Lymphatic: Aortic atherosclerosis. No enlarged abdominal or pelvic lymph nodes. Reproductive: No mass or other abnormality. Other: No abdominal wall hernia or abnormality. No abdominopelvic ascites. Musculoskeletal:  No acute or significant osseous findings. IMPRESSION: 1. Continued interval decrease in size of a spiculated nodule of the peripheral right upper lobe, now almost completely bandlike. 2. Continued decrease in size of mediastinal lymph nodes. There are no persistently enlarged lymph nodes in the mediastinum. 3. There is however a new hypodense lesion of the left lobe of the liver, worrisome for a new metastasis. This could be more completely characterized by multiphasic contrast enhanced MRI. 4.  Occasional scattered ground-glass pulmonary nodules are unchanged and nonspecific. Continued attention on follow-up. 5. Mild emphysema. Background of very fine centrilobular nodularity, most concentrated in the lung apices, most commonly seen in smoking-related respiratory bronchiolitis. 6. Coronary artery disease. 7. Hepatic steatosis. 8. Nonobstructive left nephrolithiasis. These results will be called to the ordering clinician or representative by the Radiologist Assistant, and communication documented in the PACS or [employer]. Aortic Atherosclerosis (ICD10-I70.0) and Emphysema (ICD10-J43.9). Electronically Signed   By: Delanna Ahmadi M.D.   On: 09/08/2021 10:48   CT Abdomen Pelvis W Contrast  Result Date: 09/08/2021 CLINICAL DATA:  Primary Cancer Type: Lung Imaging Indication: Assess response to therapy Interval therapy since last imaging? Yes Initial Cancer Diagnosis Date: 04/05/2021; Established by: Biopsy-proven Detailed Pathology: Stage IV non-small cell lung cancer, adenocarcinoma. Primary Tumor location: Right upper lobe. Brain metastasis. Surgeries: None. Chemotherapy: Yes; Ongoing? Yes; Most recent administration: 08/21/2021 Immunotherapy?  Yes; Type: Keytruda; Ongoing? Yes Radiation therapy? Yes; Date Range: 04/24/2021 - 05/04/2021; Target: Brain EXAM: CT CHEST, ABDOMEN AND PELVIS WITH CONTRAST TECHNIQUE: Multidetector CT imaging of the chest was performed during intravenous contrast administration. CONTRAST:  69m OMNIPAQUE IOHEXOL 350 MG/ML SOLN, additional oral enteric contrast COMPARISON:  Most recent CT chest, abdomen and pelvis 07/05/2021. 04/07/2021 PET-CT. FINDINGS: CT CHEST FINDINGS Cardiovascular: Aortic atherosclerosis. Normal heart size. Left coronary artery calcifications. No pericardial effusion. Mediastinum/Nodes: Continued decrease in size of mediastinal lymph nodes, subcarinal node measuring 2.0 x 0.6 cm, previously 2.2 x 1.0 cm (series 2, image 30). There are no persistently  enlarged lymph nodes in the mediastinum. Point thyroid gland, trachea, and esophagus demonstrate no significant findings. Lungs/Pleura: Mild paraseptal emphysema. Background of very fine centrilobular nodularity, most concentrated in the lung apices. Continued interval decrease in size of a spiculated nodule of the peripheral right upper lobe, now almost completely bandlike and measuring no greater than 0.8 x 0.4 cm, previously 0.9 x 0.6 cm (series 4, image 38). Occasional scattered ground-glass nodules are unchanged, for example a 0.7 cm nodule of the right upper lobe (series 4, image 55) and a 0.6 cm nodule of the peripheral left upper lobe (series 4, image 68). No pleural effusion or pneumothorax. Musculoskeletal: No chest wall mass or suspicious bone lesions identified. CT ABDOMEN PELVIS FINDINGS Hepatobiliary: There is a new, somewhat ill-defined hypodense lesion of the left lobe of the liver, hepatic segment II, measuring 1.7 x 1.0 cm (series 2, image 53). Unchanged subcentimeter low-attenuation lesion of the inferior right lobe of the liver, hepatic segment VI (series 2, image 76). Hepatic steatosis. No gallstones, gallbladder wall thickening, or biliary dilatation. Pancreas: Unremarkable. No pancreatic ductal dilatation or surrounding inflammatory changes. Spleen: Normal in size without significant abnormality. Adrenals/Urinary Tract: Adrenal glands are unremarkable. Nonobstructive calculus of the superior pole of the left kidney. No right-sided calculi, ureteral calculi, or hydronephrosis. Bladder is unremarkable. Stomach/Bowel: Stomach is within normal limits. Appendix appears normal. No evidence of bowel wall thickening, distention, or inflammatory changes. Descending colonic diverticulosis. Vascular/Lymphatic: Aortic atherosclerosis. No enlarged abdominal or pelvic lymph nodes. Reproductive:  No mass or other abnormality. Other: No abdominal wall hernia or abnormality. No abdominopelvic ascites.  Musculoskeletal: No acute or significant osseous findings. IMPRESSION: 1. Continued interval decrease in size of a spiculated nodule of the peripheral right upper lobe, now almost completely bandlike. 2. Continued decrease in size of mediastinal lymph nodes. There are no persistently enlarged lymph nodes in the mediastinum. 3. There is however a new hypodense lesion of the left lobe of the liver, worrisome for a new metastasis. This could be more completely characterized by multiphasic contrast enhanced MRI. 4. Occasional scattered ground-glass pulmonary nodules are unchanged and nonspecific. Continued attention on follow-up. 5. Mild emphysema. Background of very fine centrilobular nodularity, most concentrated in the lung apices, most commonly seen in smoking-related respiratory bronchiolitis. 6. Coronary artery disease. 7. Hepatic steatosis. 8. Nonobstructive left nephrolithiasis. These results will be called to the ordering clinician or representative by the Radiologist Assistant, and communication documented in the PACS or [employer]. Aortic Atherosclerosis (ICD10-I70.0) and Emphysema (ICD10-J43.9). Electronically Signed   By: Delanna Ahmadi M.D.   On: 09/08/2021 10:48    ASSESSMENT AND PLAN: This is a very pleasant 65 years old white male recently diagnosed with stage IV (T1b, N3, M1 C) non-small cell lung cancer favoring adenocarcinoma presented with right upper lobe lung nodule in addition to right hilar, subcarinal and bilateral mediastinal as well as supraclavicular lymphadenopathy.  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  His PD-L1 expression is 80% and his molecular studies showed KRAS G12C mutation. The patient underwent SRS to metastatic brain lesion under the care of Dr. Tammi Klippel and he is currently undergoing systemic chemotherapy with carboplatin for AUC of 5, Alimta 500 Mg/M2 and Keytruda 200 Mg IV every 3 weeks status post 5 cycles.  Starting from cycle #5 the  patient will be treated with maintenance treatment with Alimta and Keytruda every 3 weeks. The patient continues to tolerate his treatment fairly well with no concerning adverse effects. He had repeat CT scan of the chest, abdomen pelvis performed recently.  I personally and independently reviewed the scan and discussed the results with the patient and his wife. His scan showed no concerning findings for disease progression and he continues to have further decrease in the size of his tumor. I recommended for him to continue his current treatment with maintenance Alimta and Keytruda and he will proceed with cycle #7 today. I will give him refill of folic acid today. The patient will come back for follow-up visit in 3 weeks for evaluation before the next cycle of his treatment. He was advised to call immediately if he has any other concerning symptoms in the interval.  The patient voices understanding of current disease status and treatment options and is in agreement with the current care plan.  All questions were answered. The patient knows to call the clinic with any problems, questions or concerns. We can certainly see the patient much sooner if necessary.  Disclaimer: This note was dictated with voice recognition software. Similar sounding words can inadvertently be transcribed and may not be corrected upon review.

## 2021-09-11 NOTE — Patient Instructions (Signed)
[facility]  Discharge Instructions: Thank you for choosing [facility] to provide your oncology and hematology care.   If you have a lab appointment with the [facility], please go directly to the [facility] and check in at the registration area.   Wear comfortable clothing and clothing appropriate for easy access to any Portacath or PICC line.   We strive to give you quality time with your provider. You may need to reschedule your appointment if you arrive late (15 or more minutes).  Arriving late affects you and other patients whose appointments are after yours.  Also, if you miss three or more appointments without notifying the office, you may be dismissed from the clinic at the provider's discretion.      For prescription refill requests, have your pharmacy contact our office and allow 72 hours for refills to be completed.    Today you received the following chemotherapy and/or immunotherapy agents Keytruda and Alimta      To help prevent nausea and vomiting after your treatment, we encourage you to take your nausea medication as directed.  BELOW ARE SYMPTOMS THAT SHOULD BE REPORTED IMMEDIATELY: *FEVER GREATER THAN 100.4 F (38 C) OR HIGHER *CHILLS OR SWEATING *NAUSEA AND VOMITING THAT IS NOT CONTROLLED WITH YOUR NAUSEA MEDICATION *UNUSUAL SHORTNESS OF BREATH *UNUSUAL BRUISING OR BLEEDING *URINARY PROBLEMS (pain or burning when urinating, or frequent urination) *BOWEL PROBLEMS (unusual diarrhea, constipation, pain near the anus) TENDERNESS IN MOUTH AND THROAT WITH OR WITHOUT PRESENCE OF ULCERS (sore throat, sores in mouth, or a toothache) UNUSUAL RASH, SWELLING OR PAIN  UNUSUAL VAGINAL DISCHARGE OR ITCHING   Items with * indicate a potential emergency and should be followed up as soon as possible or go to the Emergency Department if any problems should occur.  Please show the CHEMOTHERAPY ALERT CARD or IMMUNOTHERAPY ALERT CARD at  check-in to the Emergency Department and triage nurse.  Should you have questions after your visit or need to cancel or reschedule your appointment, please contact [facility]  Dept: [phone]  and follow the prompts.  Office hours are 8:00 a.m. to 4:30 p.m. Monday - Friday. Please note that voicemails left after 4:00 p.m. may not be returned until the following business day.  We are closed weekends and major holidays. You have access to a nurse at all times for urgent questions. Please call the main number to the clinic Dept: [phone] and follow the prompts.   For any non-urgent questions, you may also contact your provider using MyChart. We now offer e-Visits for anyone 64 and older to request care online for non-urgent symptoms. For details visit [email or web address].   Also download the MyChart app! Go to the app store, search "MyChart", open the app, select [facility], and log in with your MyChart username and password.  Due to Covid, a mask is required upon entering the hospital/clinic. If you do not have a mask, one will be given to you upon arrival. For doctor visits, patients may have 1 support person aged 64 or older with them. For treatment visits, patients cannot have anyone with them due to current Covid guidelines and our immunocompromised population.

## 2021-09-12 ENCOUNTER — Telehealth: Payer: Self-pay | Admitting: Physician Assistant

## 2021-09-12 NOTE — Telephone Encounter (Signed)
Called the patient. Spoke to him and his wife and let them know Dr. Julien Nordmann ordered an MRI of the liver to further evaluate the liver lesion seen on CT. The patient has this scheduled for next week. They are aware of the findings and reasoning for MRI.

## 2021-09-18 ENCOUNTER — Other Ambulatory Visit: Payer: Self-pay

## 2021-09-18 ENCOUNTER — Other Ambulatory Visit: Payer: 59

## 2021-09-18 ENCOUNTER — Ambulatory Visit (HOSPITAL_COMMUNITY)
Admission: RE | Admit: 2021-09-18 | Discharge: 2021-09-18 | Disposition: A | Discharge status: Home / Self Care | Payer: 59 | Source: Ambulatory Visit | Attending: Internal Medicine | Admitting: Internal Medicine

## 2021-09-18 DIAGNOSIS — K769 Liver disease, unspecified: Secondary | ICD-10-CM | POA: Diagnosis not present

## 2021-09-18 DIAGNOSIS — K828 Other specified diseases of gallbladder: Secondary | ICD-10-CM | POA: Diagnosis not present

## 2021-09-18 DIAGNOSIS — Z85118 Personal history of other malignant neoplasm of bronchus and lung: Secondary | ICD-10-CM | POA: Diagnosis not present

## 2021-09-18 DIAGNOSIS — K839 Disease of biliary tract, unspecified: Secondary | ICD-10-CM | POA: Diagnosis not present

## 2021-09-18 DIAGNOSIS — D1803 Hemangioma of intra-abdominal structures: Secondary | ICD-10-CM | POA: Diagnosis not present

## 2021-09-18 IMAGING — MR MR ABDOMEN WO/W CM
15 series · 48 of 48 positions shown · IV contrast (gadavist)
Comparison: CT on [DATE]

CLINICAL DATA: Left hepatic lobe lesion on recent CT. Lung
carcinoma.

EXAM:
MRI ABDOMEN WITHOUT AND WITH CONTRAST
TECHNIQUE: Multiplanar multisequence MR imaging of the abdomen was performed
both before and after the administration of intravenous contrast.
CONTRAST:  8mL GADAVIST GADOBUTROL 1 MMOL/ML IV SOLN

[Series 6: T2 · coronal · 6.0mm · 1.56mm/px · 1 of 34 slices shown (1 of 2)]
[im 1/34]
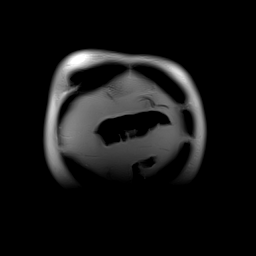

[Series 7: T2 fat-sat · axial · 6.0mm · 1.41mm/px · z∈[-169,+68]mm · 2 of 34 slices shown (1 of 2)]
[im 1/34]
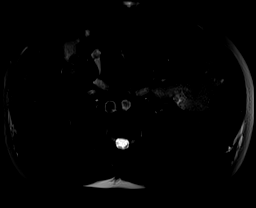
[im 34/34]
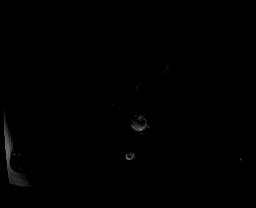

[Series 9: T1 · axial · 3.0mm · 1.12mm/px · z∈[-177,+60]mm · 4 of 80 slices shown (1 of 2)]
[im 1/80]
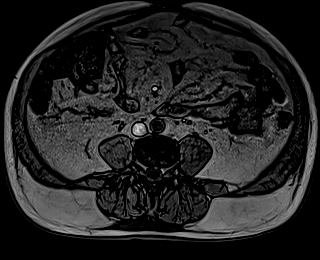
[im 27/80]
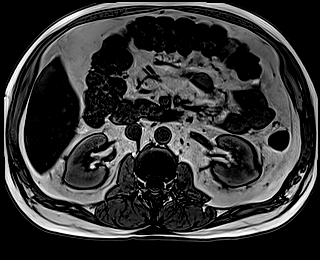
[im 53/80]
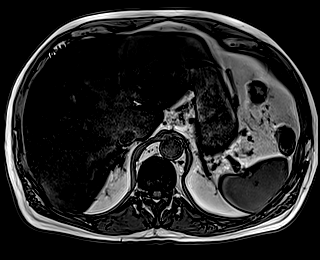
[im 80/80]
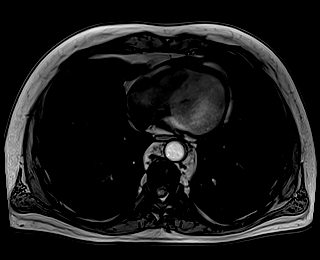

[Series 10: T1 · axial · 3.0mm · 1.12mm/px · z∈[-177,+60]mm · 4 of 80 slices shown (2 of 2)]
[im 1/80]
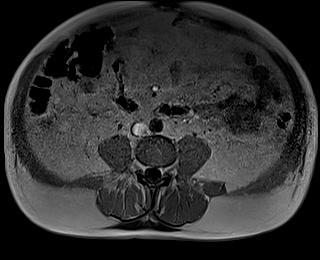
[im 27/80]
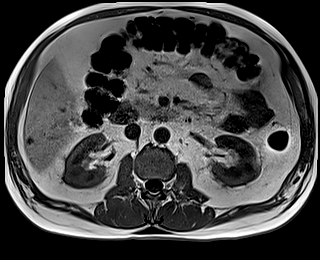
[im 53/80]
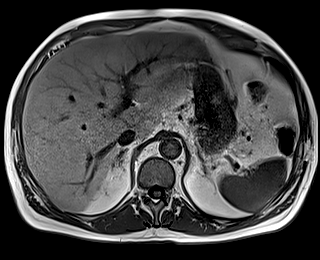
[im 80/80]
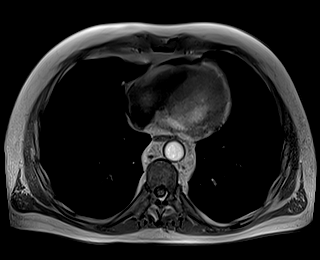

[Series 12: T2 fat-sat · axial · 6.0mm · 1.12mm/px · z∈[-174,+78]mm · 2 of 36 slices shown (2 of 2)]
[im 1/36]
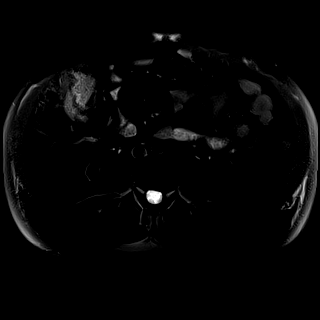
[im 36/36]
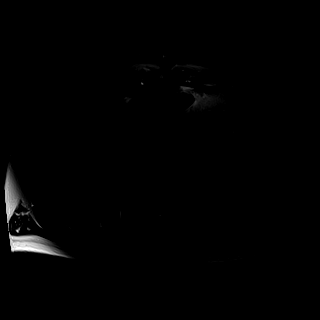

[Series 13: DWI · axial · 6.0mm · 1.36mm/px · z∈[-189,+63]mm · 4 of 72 slices shown (1 of 2)]
[im 1/72]
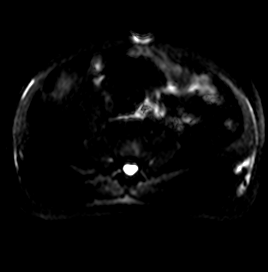
[im 24/72]
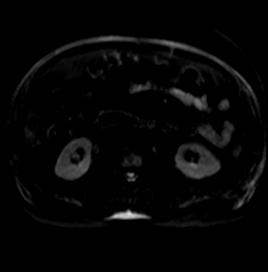
[im 48/72]
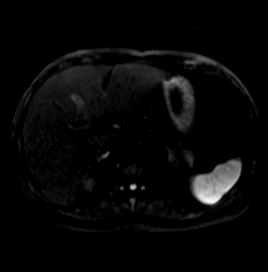
[im 72/72]
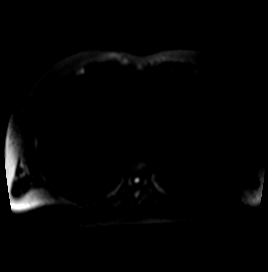

[Series 14: DWI · axial · 6.0mm · 1.36mm/px · z∈[-189,+63]mm · 2 of 36 slices shown (2 of 2)]
[im 1/36]
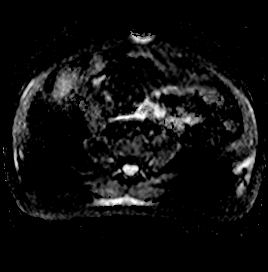
[im 36/36]
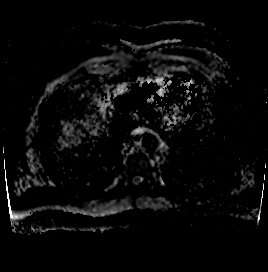

[Series 15: bSSFP · axial · 4.5mm · 0.84mm/px · z∈[-162,+59]mm · 3 of 50 slices shown]
[im 1/50]
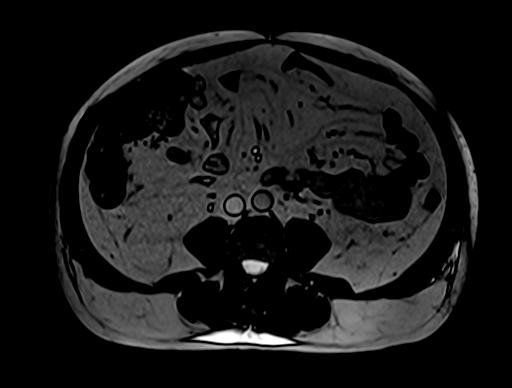
[im 25/50]
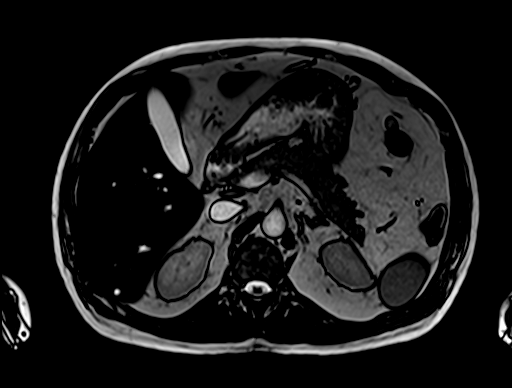
[im 50/50]
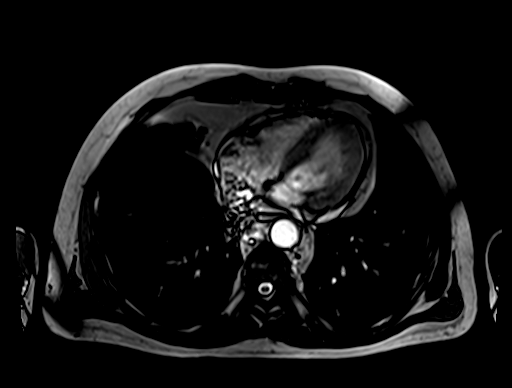

[Series 17: T1 dynamic · axial · 3.0mm · 1.12mm/px · z∈[-170,+67]mm · 4 of 80 slices shown (1 of 6)]
[im 1/80]
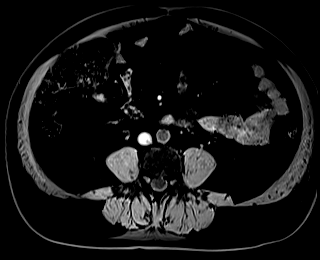
[im 27/80]
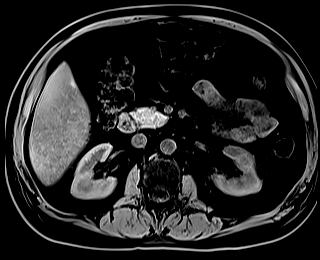
[im 53/80]
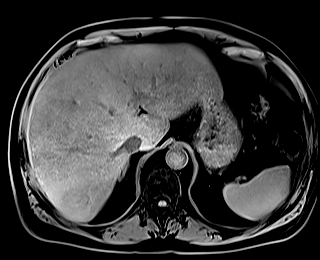
[im 80/80]
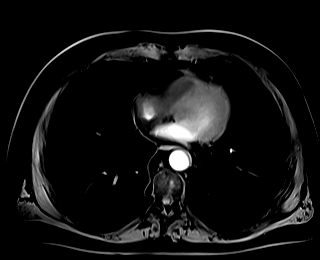

[Series 20: T1 dynamic · axial · 3.0mm · 1.12mm/px · z∈[-170,+67]mm · 4 of 80 slices shown (2 of 6)]
[im 1/80]
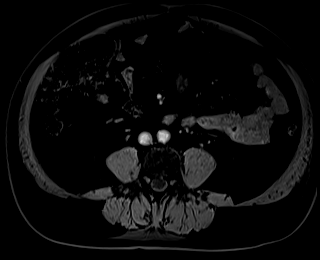
[im 27/80]
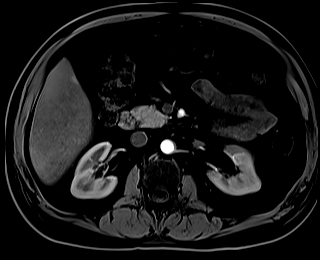
[im 53/80]
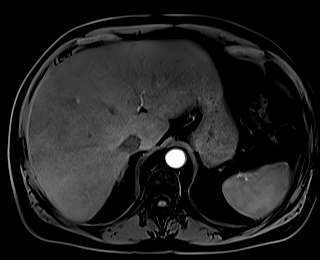
[im 80/80]
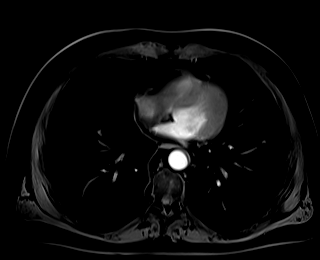

[Series 22: T1 dynamic · axial · 3.0mm · 1.12mm/px · z∈[-170,+67]mm · 4 of 80 slices shown (3 of 6)]
[im 1/80]
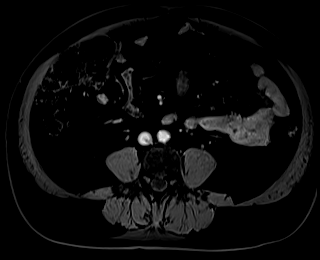
[im 27/80]
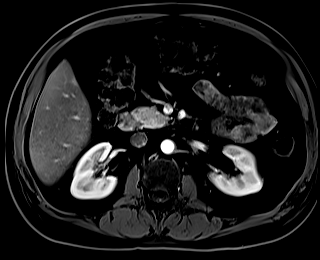
[im 53/80]
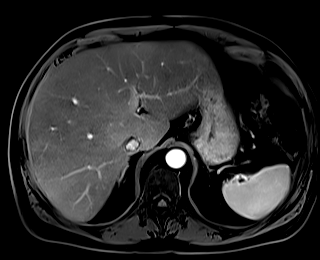
[im 80/80]
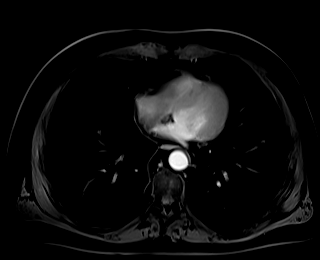

[Series 24: T1 dynamic · axial · 3.0mm · 1.12mm/px · z∈[-170,+67]mm · 4 of 80 slices shown (4 of 6)]
[im 1/80]
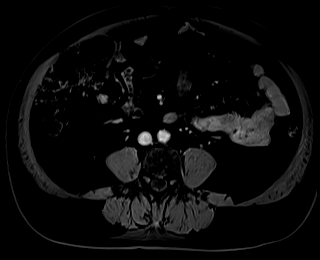
[im 27/80]
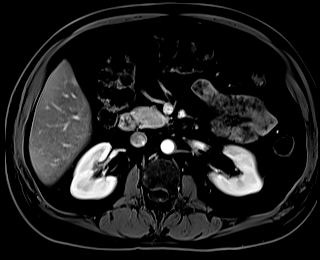
[im 53/80]
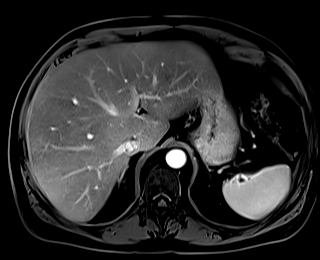
[im 80/80]
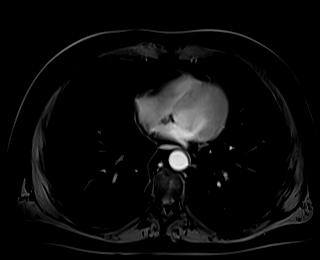

[Series 26: T1 dynamic · coronal · 3.0mm · 1.38mm/px · 4 of 80 slices shown (5 of 6)]
[im 1/80]
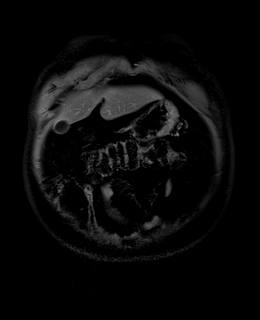
[im 27/80]
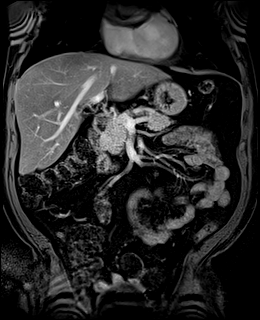
[im 53/80]
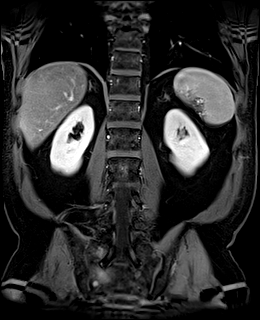
[im 80/80]
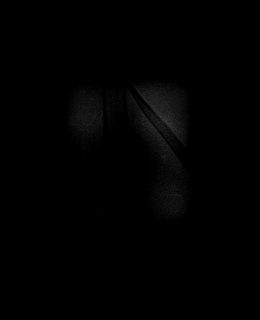

[Series 27: T2 · axial · 6.0mm · 1.56mm/px · z∈[-169,+68]mm · 2 of 34 slices shown (2 of 2)]
[im 1/34]
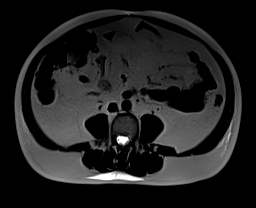
[im 34/34]
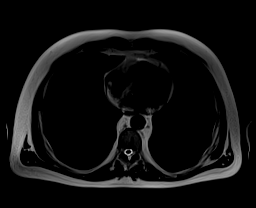

[Series 29: T1 dynamic · axial · 3.0mm · 1.12mm/px · z∈[-170,+67]mm · 4 of 80 slices shown (6 of 6)]
[im 1/80]
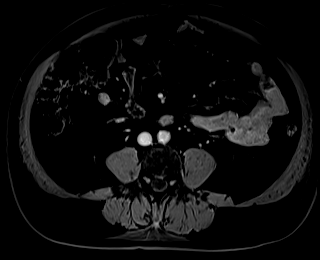
[im 27/80]
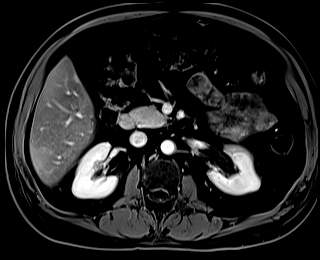
[im 53/80]
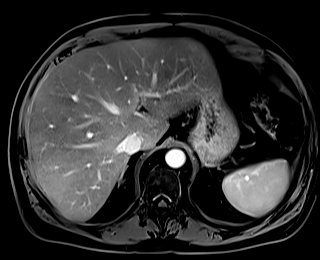
[im 80/80]
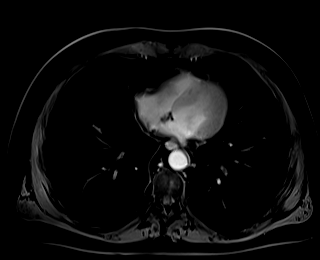

[48 of 48 positions shown; findings below may reference images not displayed]

FINDINGS: Lower chest: No acute findings.

Hepatobiliary: Severe somewhat heterogeneous, geographic pattern of
diffuse hepatic steatosis is seen on chemical shift imaging. A
sub-cm benign hemangioma is seen in the posterior right hepatic
lobe. A sub-cm cyst is seen adjacent to the gallbladder fossa.

A subtle lesion with mild T2 hyperintensity and decreased
enhancement is seen in segment 2 of the left lobe which measures
x 1.7 cm (see images 22/[REDACTED]). No abnormality is seen at
this site on diffusion imaging, and chemical shift imaging shows
signal dropout (image [DATE]) indicating that this likely represents a
site of focal fatty infiltration rather than neoplasm. This
corresponds to the lesion seen on recent CT.

Gallbladder is unremarkable. No evidence of biliary ductal
dilatation.

Pancreas:  No mass or inflammatory changes.

Spleen:  Within normal limits in size and appearance.

Adrenals/Urinary Tract: No masses identified. No evidence of
hydronephrosis.

Stomach/Bowel: Visualized portion unremarkable.

Vascular/Lymphatic: No pathologically enlarged lymph nodes
identified. No acute vascular findings.

Other:  None.

Musculoskeletal:  No suspicious bone lesions identified.
IMPRESSION: Severe diffuse geographic pattern of hepatic steatosis.

Subtle lesion in segment 2 of the left hepatic lobe, which
corresponds to the lesion seen on CT, and is favored to represent a
focal area of fatty infiltration rather than neoplasm. Consider
further evaluation with PET-CT, or short-term follow-up MRI in 3-4
months.

No other sites of abdominal metastatic disease identified.

## 2021-09-18 MED ORDER — GADOBUTROL 1 MMOL/ML IV SOLN
8.0000 mL | Freq: Once | INTRAVENOUS | Status: AC | PRN
  Administered 2021-09-18: 8 mL via INTRAVENOUS

## 2021-09-22 ENCOUNTER — Other Ambulatory Visit (HOSPITAL_COMMUNITY)
Admission: RE | Admit: 2021-09-22 | Discharge: 2021-09-22 | Disposition: A | Discharge status: Home / Self Care | Payer: 59 | Source: Ambulatory Visit | Attending: "Endocrinology | Admitting: "Endocrinology

## 2021-09-22 DIAGNOSIS — E230 Hypopituitarism: Secondary | ICD-10-CM | POA: Insufficient documentation

## 2021-09-22 DIAGNOSIS — E291 Testicular hypofunction: Secondary | ICD-10-CM | POA: Diagnosis not present

## 2021-09-22 LAB — PSA: Prostatic Specific Antigen: 1.68 ng/mL (ref 0.00–4.00)

## 2021-09-22 LAB — T4, FREE: Free T4: 0.75 ng/dL (ref 0.61–1.12)

## 2021-09-23 ENCOUNTER — Other Ambulatory Visit: Payer: Self-pay

## 2021-09-23 LAB — T3, FREE: T3, Free: 2.3 pg/mL (ref 2.0–4.4)

## 2021-09-24 LAB — TESTOSTERONE,FREE AND TOTAL
Testosterone, Free: 4.3 pg/mL — ABNORMAL LOW (ref 6.6–18.1)
Testosterone: 206 ng/dL — ABNORMAL LOW (ref 264–916)

## 2021-09-25 ENCOUNTER — Other Ambulatory Visit: Payer: 59

## 2021-09-26 ENCOUNTER — Other Ambulatory Visit: Payer: Self-pay

## 2021-09-26 ENCOUNTER — Ambulatory Visit: Payer: 59 | Admitting: "Endocrinology

## 2021-09-26 ENCOUNTER — Encounter: Payer: Self-pay | Admitting: "Endocrinology

## 2021-09-26 ENCOUNTER — Other Ambulatory Visit (HOSPITAL_COMMUNITY): Payer: Self-pay

## 2021-09-26 VITALS — BP 146/98 | HR 112 | Ht 73.0 in | Wt 185.0 lb

## 2021-09-26 DIAGNOSIS — E230 Hypopituitarism: Secondary | ICD-10-CM | POA: Diagnosis not present

## 2021-09-26 DIAGNOSIS — E291 Testicular hypofunction: Secondary | ICD-10-CM | POA: Diagnosis not present

## 2021-09-26 DIAGNOSIS — E039 Hypothyroidism, unspecified: Secondary | ICD-10-CM

## 2021-09-26 DIAGNOSIS — E2740 Unspecified adrenocortical insufficiency: Secondary | ICD-10-CM | POA: Diagnosis not present

## 2021-09-26 MED ORDER — SYRINGE/NEEDLE (DISP) 21G X 1-1/2" 3 ML MISC
2 refills | Status: DC
Start: 2021-09-26 — End: 2023-09-02
  Filled 2021-09-26: qty 100, 360d supply, fill #0

## 2021-09-26 NOTE — Progress Notes (Signed)
Endocrinology follow-up note                                             09/26/2021, 5:20 PM   Subjective:    Patient ID: Bradley Goodman, male    DOB: 1956/06/23, PCP [name], FNP   Past Medical History:  Diagnosis Date   Cervical spondylolysis    Essential hypertension    GERD (gastroesophageal reflux disease)    History of kidney stones    History of migraine    Hyperlipidemia    Hypertension    PONV (postoperative nausea and vomiting)    Type 2 diabetes mellitus ([place] [facility])    Past Surgical History:  Procedure Laterality Date   Bilateral inguinal hernia repair     BRONCHIAL NEEDLE ASPIRATION BIOPSY  04/05/2021   Procedure: BRONCHIAL NEEDLE ASPIRATION BIOPSIES;  Surgeon: Garner Nash, DO;  Location: [place];  Service: Pulmonary;;   COLONOSCOPY  01/23/2012   Procedure: COLONOSCOPY;  Surgeon: Daneil Dolin, MD;  [facility];  Service: Endoscopy;  Laterality: N/A;  9:30 AM   COLONOSCOPY N/A 10/11/2015   Procedure: COLONOSCOPY;  Surgeon: Daneil Dolin, MD;  [facility];  Service: Endoscopy;  Laterality: N/A;  830   COLONOSCOPY N/A 10/14/2019   Procedure: COLONOSCOPY;  Surgeon: Daneil Dolin, MD;  [facility];  Service: Endoscopy;  Laterality: N/A;  1:45   POLYPECTOMY  10/14/2019   Procedure: POLYPECTOMY;  Surgeon: Daneil Dolin, MD;  [facility];  Service: Endoscopy;;  ascending colon, descending colon   VIDEO BRONCHOSCOPY WITH ENDOBRONCHIAL ULTRASOUND N/A 04/05/2021   Procedure: VIDEO BRONCHOSCOPY WITH ENDOBRONCHIAL ULTRASOUND;  Surgeon: Garner Nash, DO;  Location: [place];  Service: Pulmonary;  Laterality: N/A;   Social History   Socioeconomic History   Marital status: Married    Spouse name: Not on file   Number of children: Not on file   Years of education: Not on file   Highest education level: Not on file  Occupational History   Not on file  Tobacco Use   Smoking status: Every Day     Packs/day: 0.50    Years: 30.00    Pack years: 15.00    Types: Cigarettes    Start date: 10/30/1973   Smokeless tobacco: Never  Vaping Use   Vaping Use: Never used  Substance and Sexual Activity   Alcohol use: Yes    Alcohol/week: 0.0 standard drinks    Comment: One drink every 6 months.   Drug use: No   Sexual activity: Yes  Other Topics Concern   Not on file  Social History Narrative   Not on file   Social Determinants of Health   Financial Resource Strain: Not on file  Food Insecurity: Not on file  Transportation Needs: Not on file  Physical Activity: Not on file  Stress: Not on file  Social Connections: Not on file   Family History  Problem Relation Age of Onset   Hypertension Mother    Diabetes Mother    Heart attack Mother    Hypertension Father    Heart attack Father    Heart attack Brother    Colon cancer Neg Hx    Outpatient Encounter Medications as of 09/26/2021  Medication Sig   cholecalciferol (VITAMIN D) 1000 UNITS  tablet Take 1,000 Units by mouth every evening.   dexamethasone (DECADRON) 1 MG tablet Take 2 tablets (2 mg total) by mouth daily.   docusate sodium (COLACE) 100 MG capsule Take 100 mg by mouth daily.   folic acid (FOLVITE) 1 MG tablet Take 1 tablet (1 mg total) by mouth daily.   Krill Oil 300 MG CAPS Take 300 mg by mouth every evening.   levothyroxine (SYNTHROID) 25 MCG tablet Take 1 tablet (25 mcg total) by mouth daily before breakfast.   loratadine (CLARITIN) 10 MG tablet Take 10 mg by mouth every evening.    losartan (COZAAR) 50 MG tablet Take 1 and 1/2 tablets (75 mg total) by mouth daily.   magic mouthwash (nystatin, diphenhydrAMINE, alum & mag hydroxide) suspension mixture Swish and spit 5 mls up to 4 times daily as needed. (Patient not taking: Reported on 08/21/2021)   mirtazapine (REMERON) 15 MG tablet Take 1 tablet (15 mg total) by mouth at bedtime.   Multiple Vitamins-Minerals (MULTIVITAMIN GUMMIES ADULT PO) Take 2 each by mouth  daily.   pantoprazole (PROTONIX) 40 MG tablet Take 1 tablet by mouth every evening.   prochlorperazine (COMPAZINE) 10 MG tablet Take 1 tablet (10 mg total) by mouth every 6 (six) hours as needed for nausea or vomiting. (Patient not taking: Reported on 08/21/2021)   rosuvastatin (CRESTOR) 20 MG tablet Take 1 tablet (20 mg total) by mouth daily.   SYRINGE-NEEDLE, DISP, 3 ML 21G X 1-1/2" 3 ML MISC Use to inject testosterone every week   testosterone cypionate (DEPO-TESTOSTERONE) 100 MG/ML injection Inject 0.5 mLs (50 mg total) into the muscle every 7 (seven) days. For IM use only   venlafaxine XR (EFFEXOR-XR) 75 MG 24 hr capsule Take 1 capsule (75 mg total) by mouth daily with breakfast.   [DISCONTINUED] SYRINGE-NEEDLE, DISP, 3 ML 21G X 1-1/2" 3 ML MISC Use to inject testosterone every week   No facility-administered encounter medications on file as of 09/26/2021.   ALLERGIES: No Known Allergies  VACCINATION STATUS: Immunization History  Administered Date(s) Administered   Hepatitis B 02/06/1990, 10/28/1990, 04/02/1991   Influenza,inj,Quad PF,6+ Mos 07/07/2019, 07/31/2021   Influenza-Unspecified 07/25/2018   Moderna Sars-Covid-2 Vaccination 12/01/2019, 12/30/2019, 10/19/2020   Pneumococcal Conjugate-13 03/11/2017   Tdap 01/17/2016    HPI Bradley Goodman is 65 y.o. male who presents today with a medical history as above. he is being seen in follow-up after he was seen in consultation for panhypopituitarism requested by [name], FNP.  History is obtained directly from the patient as well as chart review. He is accompanied by his wife.  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  He is status post radiosurgery of CNS metastasis and chemotherapy.  His chemotherapy in the involved high-dose steroids in the form of dexamethasone, currently on dexamethasone 2 mg p.o. daily.   During this process, he developed fatigue, low libido, and  loss of skeletal muscle mass.  His labs showed hypogonadism, partial adrenal insufficiency, and hypothyroidism.  For this endocrine deficits, he was started on testosterone 50 mg IM every 7 days which has helped increase his testosterone from undetectable to 206, low-dose levothyroxine 25 mcg p.o. daily before breakfast. He returns with significant improvement symptomatically besides correction of his biochemical deficits. He denies prior testicular injury.  He denies testicular radiation . He wishes to be continued on testosterone replacement therapy as well as his other hormones.  He has gained 14 pounds since last visit,  a good development for him.   He is back to enjoy his outdoor activities including golfing.    Review of Systems  Constitutional: + Stable weight,   + fatigue, + subjective hypothermia, + low libido  Objective:    Vitals with BMI 09/26/2021 09/11/2021 08/21/2021  Height 6\' 1"  6\' 1"  6\' 1"   Weight 185 lbs 185 lbs 10 oz 181 lbs 3 oz  BMI 24.41 14.43 15.40  Systolic 086 761 950  Diastolic 98 97 81  Pulse 932 108 107    BP (!) 146/98    Pulse (!) 112    Ht 6\' 1"  (1.854 m)    Wt 185 lb (83.9 kg)    BMI 24.41 kg/m   Wt Readings from Last 3 Encounters:  09/26/21 185 lb (83.9 kg)  09/11/21 185 lb 9.6 oz (84.2 kg)  08/21/21 181 lb 3.2 oz (82.2 kg)    Physical Exam  Constitutional:  Body mass index is 24.41 kg/m.,  not in acute distress, normal state of mind Eyes: PERRLA, EOMI, no exophthalmos ENT: moist mucous membranes, no gross thyromegaly, no gross cervical lymphadenopathy   CMP ( most recent) CMP     Component Value Date/Time   NA 141 09/11/2021 1321   NA 144 01/10/2021 0849   K 3.7 09/11/2021 1321   CL 104 09/11/2021 1321   CO2 26 09/11/2021 1321   GLUCOSE 243 (H) 09/11/2021 1321   BUN 11 09/11/2021 1321   BUN 15 01/10/2021 0849   CREATININE 1.08 09/11/2021 1321   CALCIUM 9.0 09/11/2021 1321   PROT 6.8 09/11/2021 1321   PROT 6.3 01/10/2021 0849    ALBUMIN 3.8 09/11/2021 1321   ALBUMIN 4.2 01/10/2021 0849   AST 27 09/11/2021 1321   ALT 50 (H) 09/11/2021 1321   ALKPHOS 89 09/11/2021 1321   BILITOT 0.2 (L) 09/11/2021 1321   GFRNONAA >60 09/11/2021 1321   GFRAA 82 07/14/2020 0945     Diabetic Labs (most recent): Lab Results  Component Value Date   HGBA1C 6.2 (H) 07/12/2021   HGBA1C 6.2 01/10/2021   HGBA1C 5.9 07/14/2020     Lipid Panel ( most recent) Lipid Panel     Component Value Date/Time   CHOL 136 07/12/2021 0841   TRIG 161 (H) 07/12/2021 0841   TRIG 87 10/18/2016 0851   HDL 56 07/12/2021 0841   HDL 37 (L) 10/18/2016 0851   CHOLHDL 2.4 07/12/2021 0841   LDLCALC 53 07/12/2021 0841   LDLCALC 53 07/13/2014 0925   LABVLDL 27 07/12/2021 0841      Lab Results  Component Value Date   TSH 1.032 09/11/2021   TSH 0.691 08/21/2021   TSH 0.929 08/15/2021   TSH 1.263 07/18/2021   TSH 0.615 07/03/2021   TSH 1.319 06/13/2021   TSH 0.400 05/23/2021   TSH 0.489 05/10/2021   TSH 0.849 05/08/2021   TSH 2.900 11/09/2019   FREET4 0.75 09/22/2021    Results for SHERVIN, CYPERT (MRN [id number]) as of 06/06/2021 16:47  Ref. Range 05/10/2021 08:15 05/15/2021 10:11 05/23/2021 14:26  Cortisol, Plasma Latest Units: ug/dL 1.8    LH Latest Ref Range: 1.7 - 8.6 mIU/mL <0.3 (L)    FSH Latest Ref Range: 1.5 - 12.4 mIU/mL 0.4 (L)    Prolactin Latest Ref Range: 4.0 - 15.2 ng/mL 27.0 (H)    Somatomedin C Latest Ref Range: 64 - 240 ng/mL 115    Glucose Latest Ref Range: 70 - 99 mg/dL  95 200 (H)  Glucose, Fasting  Latest Ref Range: 70 - 99 mg/dL 97    Testosterone Latest Ref Range: 264 - 916 ng/dL <3 (L)    TSH Latest Ref Range: 0.320 - 4.118 uIU/mL 0.489  0.400  Thyroxine (T4) Latest Ref Range: 4.5 - 12.0 ug/dL 5.5           Assessment & Plan:   1. Hypopituitarism  2.  Hypogonadism 3.hypothyroidism 4.partial adrenal insufficiency -He is accompanied by his wife to clinic.  I discussed his recent findings and previous lab work with  him.  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 -He would benefit from continued supplement with Decadron.  He is advised to continue  Decadron 2 mg p.o. daily.  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  He is benefiting from low-dose levothyroxine with correction of his thyroid function tests.  He is advised to continue levothyroxine 25 mcg p.o. daily before breakfast.   - We discussed about the correct intake of his thyroid hormone, on empty stomach at fasting, with water, separated by at least 30 minutes from breakfast and other medications,  and separated by more than 4 hours from calcium, iron, multivitamins, acid reflux medications (PPIs). -Patient is made aware of the fact that thyroid hormone replacement is needed for life, dose to be adjusted by periodic monitoring of thyroid function tests.  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  He has responded to testosterone replacement therapy with total testosterone of 206 improving from undetectable levels.  He is advised to continue  testosterone 50 mg IM every 7 days with plan to repeat labs in 3 months.    -Treatment target for him will be between 250-350 ng per DL, with the aim being to help him with libido as well as possibly avoid further loss of skeletal muscle mass.  - he is advised to maintain close follow up with his oncologist, neurologist, radiologist and PCP [name], FNP for primary care needs.    I spent 31 minutes in the care of the patient today including review of labs from Thyroid Function, CMP, and other relevant labs ; imaging/biopsy records (current and previous including abstractions from other facilities); face-to-face time discussing  his lab results and symptoms, medications doses, his options  of short and long term treatment based on the latest standards of care / guidelines;   and documenting the encounter.  Ernesta Amble Sanagustin  participated in the discussions, expressed understanding, and voiced agreement with the above plans.  All questions were answered to his satisfaction. he is encouraged to contact clinic should he have any questions or concerns prior to his return visit.   Follow up plan: Return in about 4 months (around 01/25/2022) for Fasting Labs  in AM B4 8.   Glade Lloyd, MD [facility] [address] [place], [place] [address] Phone: [phone]  Fax: [phone]     09/26/2021, 5:20 PM  This note was partially dictated with voice recognition software. Similar sounding words can be transcribed inadequately or may not  be corrected upon review.

## 2021-09-27 ENCOUNTER — Other Ambulatory Visit (HOSPITAL_COMMUNITY): Payer: Self-pay

## 2021-09-27 ENCOUNTER — Encounter: Payer: Self-pay | Admitting: Internal Medicine

## 2021-10-03 ENCOUNTER — Inpatient Hospital Stay: Payer: 59 | Admitting: Internal Medicine

## 2021-10-03 ENCOUNTER — Inpatient Hospital Stay: Payer: 59

## 2021-10-03 ENCOUNTER — Other Ambulatory Visit: Payer: Self-pay

## 2021-10-03 ENCOUNTER — Other Ambulatory Visit: Payer: Self-pay | Admitting: Internal Medicine

## 2021-10-03 VITALS — BP 129/88 | HR 118 | Temp 98.2°F | Resp 17 | Wt 181.2 lb

## 2021-10-03 DIAGNOSIS — C7951 Secondary malignant neoplasm of bone: Secondary | ICD-10-CM | POA: Diagnosis not present

## 2021-10-03 DIAGNOSIS — F17210 Nicotine dependence, cigarettes, uncomplicated: Secondary | ICD-10-CM | POA: Diagnosis not present

## 2021-10-03 DIAGNOSIS — C3411 Malignant neoplasm of upper lobe, right bronchus or lung: Secondary | ICD-10-CM | POA: Diagnosis not present

## 2021-10-03 DIAGNOSIS — Z5112 Encounter for antineoplastic immunotherapy: Secondary | ICD-10-CM | POA: Diagnosis not present

## 2021-10-03 DIAGNOSIS — C7931 Secondary malignant neoplasm of brain: Secondary | ICD-10-CM | POA: Diagnosis not present

## 2021-10-03 DIAGNOSIS — Z79899 Other long term (current) drug therapy: Secondary | ICD-10-CM | POA: Diagnosis not present

## 2021-10-03 DIAGNOSIS — C3491 Malignant neoplasm of unspecified part of right bronchus or lung: Secondary | ICD-10-CM

## 2021-10-03 DIAGNOSIS — Z923 Personal history of irradiation: Secondary | ICD-10-CM | POA: Diagnosis not present

## 2021-10-03 DIAGNOSIS — Z5111 Encounter for antineoplastic chemotherapy: Secondary | ICD-10-CM

## 2021-10-03 LAB — CBC WITH DIFFERENTIAL (CANCER CENTER ONLY)
Abs Immature Granulocytes: 0.11 10*3/uL — ABNORMAL HIGH (ref 0.00–0.07)
Basophils Absolute: 0.1 10*3/uL (ref 0.0–0.1)
Basophils Relative: 1 %
Eosinophils Absolute: 0.1 10*3/uL (ref 0.0–0.5)
Eosinophils Relative: 1 %
HCT: 41.2 % (ref 39.0–52.0)
Hemoglobin: 13.3 g/dL (ref 13.0–17.0)
Immature Granulocytes: 1 %
Lymphocytes Relative: 18 %
Lymphs Abs: 2 10*3/uL (ref 0.7–4.0)
MCH: 31.5 pg (ref 26.0–34.0)
MCHC: 32.3 g/dL (ref 30.0–36.0)
MCV: 97.6 fL (ref 80.0–100.0)
Monocytes Absolute: 0.7 10*3/uL (ref 0.1–1.0)
Monocytes Relative: 6 %
Neutro Abs: 8.2 10*3/uL — ABNORMAL HIGH (ref 1.7–7.7)
Neutrophils Relative %: 73 %
Platelet Count: 242 10*3/uL (ref 150–400)
RBC: 4.22 MIL/uL (ref 4.22–5.81)
RDW: 15 % (ref 11.5–15.5)
WBC Count: 11.1 10*3/uL — ABNORMAL HIGH (ref 4.0–10.5)
nRBC: 0 % (ref 0.0–0.2)

## 2021-10-03 LAB — CMP (CANCER CENTER ONLY)
ALT: 45 U/L — ABNORMAL HIGH (ref 0–44)
AST: 25 U/L (ref 15–41)
Albumin: 4.1 g/dL (ref 3.5–5.0)
Alkaline Phosphatase: 97 U/L (ref 38–126)
Anion gap: 9 (ref 5–15)
BUN: 13 mg/dL (ref 8–23)
CO2: 28 mmol/L (ref 22–32)
Calcium: 9.4 mg/dL (ref 8.9–10.3)
Chloride: 100 mmol/L (ref 98–111)
Creatinine: 0.95 mg/dL (ref 0.61–1.24)
GFR, Estimated: >60 mL/min (ref >60)
Glucose, Bld: 248 mg/dL — ABNORMAL HIGH (ref 70–99)
Potassium: 3.3 mmol/L — ABNORMAL LOW (ref 3.5–5.1)
Sodium: 137 mmol/L (ref 135–145)
Total Bilirubin: 0.4 mg/dL (ref 0.3–1.2)
Total Protein: 6.9 g/dL (ref 6.5–8.1)

## 2021-10-03 LAB — TSH: TSH: 0.718 u[IU]/mL (ref 0.320–4.118)

## 2021-10-03 MED ORDER — SODIUM CHLORIDE 0.9 % IV SOLN
200.0000 mg | Freq: Once | INTRAVENOUS | Status: AC
  Administered 2021-10-03: 15:00:00 200 mg via INTRAVENOUS
  Filled 2021-10-03: qty 8

## 2021-10-03 MED ORDER — SODIUM CHLORIDE 0.9 % IV SOLN: Freq: Once | INTRAVENOUS | Status: AC

## 2021-10-03 MED ORDER — PROCHLORPERAZINE MALEATE 10 MG PO TABS
10.0000 mg | ORAL_TABLET | Freq: Once | ORAL | Status: AC
  Administered 2021-10-03: 15:00:00 10 mg via ORAL
  Filled 2021-10-03: qty 1

## 2021-10-03 MED ORDER — SODIUM CHLORIDE 0.9 % IV SOLN
500.0000 mg/m2 | Freq: Once | INTRAVENOUS | Status: AC
  Administered 2021-10-03: 16:00:00 1000 mg via INTRAVENOUS
  Filled 2021-10-03: qty 40

## 2021-10-03 NOTE — Progress Notes (Signed)
Per Dr. Julien Nordmann, it is okay to treat pt today with  Alimta and keytruda and pulse of 118.

## 2021-10-03 NOTE — Patient Instructions (Signed)
[facility]  Discharge Instructions: Thank you for choosing [facility] to provide your oncology and hematology care.   If you have a lab appointment with the Cancer Center, please go directly to the Cancer Center and check in at the registration area.   Wear comfortable clothing and clothing appropriate for easy access to any Portacath or PICC line.   We strive to give you quality time with your provider. You may need to reschedule your appointment if you arrive late (15 or more minutes).  Arriving late affects you and other patients whose appointments are after yours.  Also, if you miss three or more appointments without notifying the office, you may be dismissed from the clinic at the provider's discretion.      For prescription refill requests, have your pharmacy contact our office and allow 72 hours for refills to be completed.    Today you received the following chemotherapy and/or immunotherapy agents: Keytruda/Alimta.      To help prevent nausea and vomiting after your treatment, we encourage you to take your nausea medication as directed.  BELOW ARE SYMPTOMS THAT SHOULD BE REPORTED IMMEDIATELY: *FEVER GREATER THAN 100.4 F (38 C) OR HIGHER *CHILLS OR SWEATING *NAUSEA AND VOMITING THAT IS NOT CONTROLLED WITH YOUR NAUSEA MEDICATION *UNUSUAL SHORTNESS OF BREATH *UNUSUAL BRUISING OR BLEEDING *URINARY PROBLEMS (pain or burning when urinating, or frequent urination) *BOWEL PROBLEMS (unusual diarrhea, constipation, pain near the anus) TENDERNESS IN MOUTH AND THROAT WITH OR WITHOUT PRESENCE OF ULCERS (sore throat, sores in mouth, or a toothache) UNUSUAL RASH, SWELLING OR PAIN  UNUSUAL VAGINAL DISCHARGE OR ITCHING   Items with * indicate a potential emergency and should be followed up as soon as possible or go to the Emergency Department if any problems should occur.  Please show the CHEMOTHERAPY ALERT CARD or IMMUNOTHERAPY ALERT CARD at  check-in to the Emergency Department and triage nurse.  Should you have questions after your visit or need to cancel or reschedule your appointment, please contact [facility]  Dept: [phone]  and follow the prompts.  Office hours are 8:00 a.m. to 4:30 p.m. Monday - Friday. Please note that voicemails left after 4:00 p.m. may not be returned until the following business day.  We are closed weekends and major holidays. You have access to a nurse at all times for urgent questions. Please call the main number to the clinic Dept: [phone] and follow the prompts.   For any non-urgent questions, you may also contact your provider using MyChart. We now offer e-Visits for anyone 18 and older to request care online for non-urgent symptoms. For details visit [email or web address].   Also download the MyChart app! Go to the app store, search "MyChart", open the app, select [facility], and log in with your MyChart username and password.  Due to Covid, a mask is required upon entering the hospital/clinic. If you do not have a mask, one will be given to you upon arrival. For doctor visits, patients may have 1 support person aged 18 or older with them. For treatment visits, patients cannot have anyone with them due to current Covid guidelines and our immunocompromised population.   

## 2021-10-03 NOTE — Progress Notes (Signed)
[name] [phone]   [phone]  OFFICE PROGRESS NOTE  [name], [place] [address]  DIAGNOSIS: Stage IV (T1b, N3, M1C) non-small cell lung cancer, favoring adenocarcinoma presented with right upper lobe lung nodule in addition to right hilar, subcarinal and bilateral mediastinal as well as supraclavicular lymphadenopathy in addition to bone and brain metastasis diagnosed in June 2022.     PD-L1 expression 80%.     Molecular Studies:  Biomarker Findings Microsatellite status - MS-Stable Tumor Mutational Burden - 6 Muts/Mb Genomic Findings For a complete list of the genes assayed, please refer to the Appendix. KRAS G12C, amplification ATM S470* CCND1 amplification - equivocal HGF amplification - equivocal MYC amplification - equivocal FGF19 amplification - equivocal FGF3 amplification - equivocal FGF4 amplification - equivocal NFKBIA amplification NKX2-1 amplification RAD21 amplification - equivocal [id number]*26 TERT promoter -124C>T TP53 rearrangement exon 9 7 Disease relevant genes with no reportable alterations: ALK, BRAF, EGFR, ERBB2, MET, RET, ROS1   PRIOR THERAPY: SRS to the metastatic brain lesions under the care of Dr. MTammi Klippel  Last treatment on 05/04/2021.   CURRENT THERAPY: Palliative systemic chemotherapy with carboplatin for an AUC 5, Alimta 500 mg/m2 and, Keytruda 200 mg IV every 3 weeks.  First dose expected on 05/08/2021.  Status post 7 cycles.  Starting from cycle #5 he will be on maintenance treatment with Alimta and Keytruda every 3 weeks.  INTERVAL HISTORY: Bradley Goodman 65y.o. male returns to the clinic today for follow-up visit accompanied by his wife.  The patient is feeling fine today with no concerning complaints.  Unfortunately he lost his brother recently to congestive heart failure and ARDS.  He denied having any current chest pain, shortness of breath,  cough or hemoptysis.  He denied having any fever or chills.  He has no nausea, vomiting, diarrhea or constipation.  He has no headache or visual changes.  He continues to tolerate his maintenance treatment with Alimta and Keytruda fairly well.  He was found on previous CT scan of the abdomen and pelvis to have a suspicious lesion in the liver.  The patient had MRI of the liver performed that showed hepatic steatosis and the lesion seen likely representing a focal area of fatty infiltration rather than neoplasm.  He is here today for evaluation before starting cycle #8 of his treatment.  MEDICAL HISTORY: Past Medical History:  Diagnosis Date   Cervical spondylolysis    Essential hypertension    GERD (gastroesophageal reflux disease)    History of kidney stones    History of migraine    Hyperlipidemia    Hypertension    PONV (postoperative nausea and vomiting)    Type 2 diabetes mellitus (HCC)     ALLERGIES:  has No Known Allergies.  MEDICATIONS:  Current Outpatient Medications  Medication Sig Dispense Refill   cholecalciferol (VITAMIN D) 1000 UNITS tablet Take 1,000 Units by mouth every evening.     dexamethasone (DECADRON) 1 MG tablet Take 2 tablets (2 mg total) by mouth daily. 180 tablet 1   docusate sodium (COLACE) 100 MG capsule Take 100 mg by mouth daily.     folic acid (FOLVITE) 1 MG tablet Take 1 tablet (1 mg total) by mouth daily. 30 tablet 4   Krill Oil 300 MG CAPS Take 300 mg by mouth every evening.     levothyroxine (SYNTHROID) 25 MCG tablet Take 1 tablet (25 mcg total) by mouth daily before  breakfast. 90 tablet 1   loratadine (CLARITIN) 10 MG tablet Take 10 mg by mouth every evening.      losartan (COZAAR) 50 MG tablet Take 1 and 1/2 tablets (75 mg total) by mouth daily. 135 tablet 1   magic mouthwash (nystatin, diphenhydrAMINE, alum & mag hydroxide) suspension mixture Swish and spit 5 mls up to 4 times daily as needed. (Patient not taking: Reported on 08/21/2021) 240 mL 1    mirtazapine (REMERON) 15 MG tablet Take 1 tablet (15 mg total) by mouth at bedtime. 90 tablet 1   Multiple Vitamins-Minerals (MULTIVITAMIN GUMMIES ADULT PO) Take 2 each by mouth daily.     pantoprazole (PROTONIX) 40 MG tablet Take 1 tablet by mouth every evening. 30 tablet 5   prochlorperazine (COMPAZINE) 10 MG tablet Take 1 tablet (10 mg total) by mouth every 6 (six) hours as needed for nausea or vomiting. (Patient not taking: Reported on 08/21/2021) 30 tablet 0   rosuvastatin (CRESTOR) 20 MG tablet Take 1 tablet (20 mg total) by mouth daily. 90 tablet 1   SYRINGE-NEEDLE, DISP, 3 ML 21G X 1-1/2" 3 ML MISC Use to inject testosterone every week 100 each 2   testosterone cypionate (DEPO-TESTOSTERONE) 100 MG/ML injection Inject 0.5 mLs (50 mg total) into the muscle every 7 (seven) days. For IM use only 10 mL 0   venlafaxine XR (EFFEXOR-XR) 75 MG 24 hr capsule Take 1 capsule (75 mg total) by mouth daily with breakfast. 90 capsule 1   No current facility-administered medications for this visit.    SURGICAL HISTORY:  Past Surgical History:  Procedure Laterality Date   Bilateral inguinal hernia repair     BRONCHIAL NEEDLE ASPIRATION BIOPSY  04/05/2021   Procedure: BRONCHIAL NEEDLE ASPIRATION BIOPSIES;  Surgeon: Garner Nash, DO;  Location: [facility];  Service: Pulmonary;;   COLONOSCOPY  01/23/2012   Procedure: COLONOSCOPY;  Surgeon: Daneil Dolin, MD;  [facility];  Service: Endoscopy;  Laterality: N/A;  9:30 AM   COLONOSCOPY N/A 10/11/2015   Procedure: COLONOSCOPY;  Surgeon: Daneil Dolin, MD;  [facility];  Service: Endoscopy;  Laterality: N/A;  830   COLONOSCOPY N/A 10/14/2019   Procedure: COLONOSCOPY;  Surgeon: Daneil Dolin, MD;  [facility];  Service: Endoscopy;  Laterality: N/A;  1:45   POLYPECTOMY  10/14/2019   Procedure: POLYPECTOMY;  Surgeon: Daneil Dolin, MD;  [facility];  Service: Endoscopy;;  ascending colon, descending colon    VIDEO BRONCHOSCOPY WITH ENDOBRONCHIAL ULTRASOUND N/A 04/05/2021   Procedure: VIDEO BRONCHOSCOPY WITH ENDOBRONCHIAL ULTRASOUND;  Surgeon: Garner Nash, DO;  Location: [facility];  Service: Pulmonary;  Laterality: N/A;    REVIEW OF SYSTEMS:  A comprehensive review of systems was negative except for: Constitutional: positive for fatigue   PHYSICAL EXAMINATION: General appearance: alert, cooperative, appears stated age, fatigued, and no distress Head: Normocephalic, without obvious abnormality, atraumatic Neck: no adenopathy, no JVD, supple, symmetrical, trachea midline, and thyroid not enlarged, symmetric, no tenderness/mass/nodules Lymph nodes: Cervical, supraclavicular, and axillary nodes normal. Resp: clear to auscultation bilaterally Back: symmetric, no curvature. ROM normal. No CVA tenderness. Cardio: regular rate and rhythm, S1, S2 normal, no murmur, click, rub or gallop GI: soft, non-tender; bowel sounds normal; no masses,  no organomegaly Extremities: extremities normal, atraumatic, no cyanosis or edema  ECOG PERFORMANCE STATUS: 1 - Symptomatic but completely ambulatory  Blood pressure 129/88, pulse (!) 118, temperature 98.2 F (36.8 C), resp. rate 17, weight 181 lb  4 oz (82.2 kg), SpO2 98 %.  LABORATORY DATA: Lab Results  Component Value Date   WBC 11.1 (H) 10/03/2021   HGB 13.3 10/03/2021   HCT 41.2 10/03/2021   MCV 97.6 10/03/2021   PLT 242 10/03/2021      Chemistry      Component Value Date/Time   NA 141 09/11/2021 1321   NA 144 01/10/2021 0849   K 3.7 09/11/2021 1321   CL 104 09/11/2021 1321   CO2 26 09/11/2021 1321   BUN 11 09/11/2021 1321   BUN 15 01/10/2021 0849   CREATININE 1.08 09/11/2021 1321      Component Value Date/Time   CALCIUM 9.0 09/11/2021 1321   ALKPHOS 89 09/11/2021 1321   AST 27 09/11/2021 1321   ALT 50 (H) 09/11/2021 1321   BILITOT 0.2 (L) 09/11/2021 1321       RADIOGRAPHIC STUDIES: CT Chest W Contrast  Result Date:  09/08/2021 CLINICAL DATA:  Primary Cancer Type: Lung Imaging Indication: Assess response to therapy Interval therapy since last imaging? Yes Initial Cancer Diagnosis Date: 04/05/2021; Established by: Biopsy-proven Detailed Pathology: Stage IV non-small cell lung cancer, adenocarcinoma. Primary Tumor location: Right upper lobe. Brain metastasis. Surgeries: None. Chemotherapy: Yes; Ongoing? Yes; Most recent administration: 08/21/2021 Immunotherapy?  Yes; Type: Keytruda; Ongoing? Yes Radiation therapy? Yes; Date Range: 04/24/2021 - 05/04/2021; Target: Brain EXAM: CT CHEST, ABDOMEN AND PELVIS WITH CONTRAST TECHNIQUE: Multidetector CT imaging of the chest was performed during intravenous contrast administration. CONTRAST:  71m OMNIPAQUE IOHEXOL 350 MG/ML SOLN, additional oral enteric contrast COMPARISON:  Most recent CT chest, abdomen and pelvis 07/05/2021. 04/07/2021 PET-CT. FINDINGS: CT CHEST FINDINGS Cardiovascular: Aortic atherosclerosis. Normal heart size. Left coronary artery calcifications. No pericardial effusion. Mediastinum/Nodes: Continued decrease in size of mediastinal lymph nodes, subcarinal node measuring 2.0 x 0.6 cm, previously 2.2 x 1.0 cm (series 2, image 30). There are no persistently enlarged lymph nodes in the mediastinum. Point thyroid gland, trachea, and esophagus demonstrate no significant findings. Lungs/Pleura: Mild paraseptal emphysema. Background of very fine centrilobular nodularity, most concentrated in the lung apices. Continued interval decrease in size of a spiculated nodule of the peripheral right upper lobe, now almost completely bandlike and measuring no greater than 0.8 x 0.4 cm, previously 0.9 x 0.6 cm (series 4, image 38). Occasional scattered ground-glass nodules are unchanged, for example a 0.7 cm nodule of the right upper lobe (series 4, image 55) and a 0.6 cm nodule of the peripheral left upper lobe (series 4, image 68). No pleural effusion or pneumothorax. Musculoskeletal: No  chest wall mass or suspicious bone lesions identified. CT ABDOMEN PELVIS FINDINGS Hepatobiliary: There is a new, somewhat ill-defined hypodense lesion of the left lobe of the liver, hepatic segment II, measuring 1.7 x 1.0 cm (series 2, image 53). Unchanged subcentimeter low-attenuation lesion of the inferior right lobe of the liver, hepatic segment VI (series 2, image 76). Hepatic steatosis. No gallstones, gallbladder wall thickening, or biliary dilatation. Pancreas: Unremarkable. No pancreatic ductal dilatation or surrounding inflammatory changes. Spleen: Normal in size without significant abnormality. Adrenals/Urinary Tract: Adrenal glands are unremarkable. Nonobstructive calculus of the superior pole of the left kidney. No right-sided calculi, ureteral calculi, or hydronephrosis. Bladder is unremarkable. Stomach/Bowel: Stomach is within normal limits. Appendix appears normal. No evidence of bowel wall thickening, distention, or inflammatory changes. Descending colonic diverticulosis. Vascular/Lymphatic: Aortic atherosclerosis. No enlarged abdominal or pelvic lymph nodes. Reproductive: No mass or other abnormality. Other: No abdominal wall hernia or abnormality. No abdominopelvic ascites. Musculoskeletal: No acute or  significant osseous findings. IMPRESSION: 1. Continued interval decrease in size of a spiculated nodule of the peripheral right upper lobe, now almost completely bandlike. 2. Continued decrease in size of mediastinal lymph nodes. There are no persistently enlarged lymph nodes in the mediastinum. 3. There is however a new hypodense lesion of the left lobe of the liver, worrisome for a new metastasis. This could be more completely characterized by multiphasic contrast enhanced MRI. 4. Occasional scattered ground-glass pulmonary nodules are unchanged and nonspecific. Continued attention on follow-up. 5. Mild emphysema. Background of very fine centrilobular nodularity, most concentrated in the lung  apices, most commonly seen in smoking-related respiratory bronchiolitis. 6. Coronary artery disease. 7. Hepatic steatosis. 8. Nonobstructive left nephrolithiasis. These results will be called to the ordering clinician or representative by the Radiologist Assistant, and communication documented in the PACS or [employer]. Aortic Atherosclerosis (ICD10-I70.0) and Emphysema (ICD10-J43.9). Electronically Signed   By: Delanna Ahmadi M.D.   On: 09/08/2021 10:48   CT Abdomen Pelvis W Contrast  Result Date: 09/08/2021 CLINICAL DATA:  Primary Cancer Type: Lung Imaging Indication: Assess response to therapy Interval therapy since last imaging? Yes Initial Cancer Diagnosis Date: 04/05/2021; Established by: Biopsy-proven Detailed Pathology: Stage IV non-small cell lung cancer, adenocarcinoma. Primary Tumor location: Right upper lobe. Brain metastasis. Surgeries: None. Chemotherapy: Yes; Ongoing? Yes; Most recent administration: 08/21/2021 Immunotherapy?  Yes; Type: Keytruda; Ongoing? Yes Radiation therapy? Yes; Date Range: 04/24/2021 - 05/04/2021; Target: Brain EXAM: CT CHEST, ABDOMEN AND PELVIS WITH CONTRAST TECHNIQUE: Multidetector CT imaging of the chest was performed during intravenous contrast administration. CONTRAST:  28mL OMNIPAQUE IOHEXOL 350 MG/ML SOLN, additional oral enteric contrast COMPARISON:  Most recent CT chest, abdomen and pelvis 07/05/2021. 04/07/2021 PET-CT. FINDINGS: CT CHEST FINDINGS Cardiovascular: Aortic atherosclerosis. Normal heart size. Left coronary artery calcifications. No pericardial effusion. Mediastinum/Nodes: Continued decrease in size of mediastinal lymph nodes, subcarinal node measuring 2.0 x 0.6 cm, previously 2.2 x 1.0 cm (series 2, image 30). There are no persistently enlarged lymph nodes in the mediastinum. Point thyroid gland, trachea, and esophagus demonstrate no significant findings. Lungs/Pleura: Mild paraseptal emphysema. Background of very fine centrilobular nodularity,  most concentrated in the lung apices. Continued interval decrease in size of a spiculated nodule of the peripheral right upper lobe, now almost completely bandlike and measuring no greater than 0.8 x 0.4 cm, previously 0.9 x 0.6 cm (series 4, image 38). Occasional scattered ground-glass nodules are unchanged, for example a 0.7 cm nodule of the right upper lobe (series 4, image 55) and a 0.6 cm nodule of the peripheral left upper lobe (series 4, image 68). No pleural effusion or pneumothorax. Musculoskeletal: No chest wall mass or suspicious bone lesions identified. CT ABDOMEN PELVIS FINDINGS Hepatobiliary: There is a new, somewhat ill-defined hypodense lesion of the left lobe of the liver, hepatic segment II, measuring 1.7 x 1.0 cm (series 2, image 53). Unchanged subcentimeter low-attenuation lesion of the inferior right lobe of the liver, hepatic segment VI (series 2, image 76). Hepatic steatosis. No gallstones, gallbladder wall thickening, or biliary dilatation. Pancreas: Unremarkable. No pancreatic ductal dilatation or surrounding inflammatory changes. Spleen: Normal in size without significant abnormality. Adrenals/Urinary Tract: Adrenal glands are unremarkable. Nonobstructive calculus of the superior pole of the left kidney. No right-sided calculi, ureteral calculi, or hydronephrosis. Bladder is unremarkable. Stomach/Bowel: Stomach is within normal limits. Appendix appears normal. No evidence of bowel wall thickening, distention, or inflammatory changes. Descending colonic diverticulosis. Vascular/Lymphatic: Aortic atherosclerosis. No enlarged abdominal or pelvic lymph nodes. Reproductive: No mass or  other abnormality. Other: No abdominal wall hernia or abnormality. No abdominopelvic ascites. Musculoskeletal: No acute or significant osseous findings. IMPRESSION: 1. Continued interval decrease in size of a spiculated nodule of the peripheral right upper lobe, now almost completely bandlike. 2. Continued decrease  in size of mediastinal lymph nodes. There are no persistently enlarged lymph nodes in the mediastinum. 3. There is however a new hypodense lesion of the left lobe of the liver, worrisome for a new metastasis. This could be more completely characterized by multiphasic contrast enhanced MRI. 4. Occasional scattered ground-glass pulmonary nodules are unchanged and nonspecific. Continued attention on follow-up. 5. Mild emphysema. Background of very fine centrilobular nodularity, most concentrated in the lung apices, most commonly seen in smoking-related respiratory bronchiolitis. 6. Coronary artery disease. 7. Hepatic steatosis. 8. Nonobstructive left nephrolithiasis. These results will be called to the ordering clinician or representative by the Radiologist Assistant, and communication documented in the PACS or [employer]. Aortic Atherosclerosis (ICD10-I70.0) and Emphysema (ICD10-J43.9). Electronically Signed   By: Delanna Ahmadi M.D.   On: 09/08/2021 10:48   MR LIVER W WO CONTRAST  Result Date: 09/20/2021 CLINICAL DATA:  Left hepatic lobe lesion on recent CT. Lung carcinoma. EXAM: MRI ABDOMEN WITHOUT AND WITH CONTRAST TECHNIQUE: Multiplanar multisequence MR imaging of the abdomen was performed both before and after the administration of intravenous contrast. CONTRAST:  38m GADAVIST GADOBUTROL 1 MMOL/ML IV SOLN COMPARISON:  CT on 09/07/2021 FINDINGS: Lower chest: No acute findings. Hepatobiliary: Severe somewhat heterogeneous, geographic pattern of diffuse hepatic steatosis is seen on chemical shift imaging. A sub-cm benign hemangioma is seen in the posterior right hepatic lobe. A sub-cm cyst is seen adjacent to the gallbladder fossa. A subtle lesion with mild T2 hyperintensity and decreased enhancement is seen in segment 2 of the left lobe which measures 1.9 x 1.7 cm (see images 22/20 and 10/27). No abnormality is seen at this site on diffusion imaging, and chemical shift imaging shows signal dropout (image  19/9) indicating that this likely represents a site of focal fatty infiltration rather than neoplasm. This corresponds to the lesion seen on recent CT. Gallbladder is unremarkable. No evidence of biliary ductal dilatation. Pancreas:  No mass or inflammatory changes. Spleen:  Within normal limits in size and appearance. Adrenals/Urinary Tract: No masses identified. No evidence of hydronephrosis. Stomach/Bowel: Visualized portion unremarkable. Vascular/Lymphatic: No pathologically enlarged lymph nodes identified. No acute vascular findings. Other:  None. Musculoskeletal:  No suspicious bone lesions identified. IMPRESSION: Severe diffuse geographic pattern of hepatic steatosis. Subtle lesion in segment 2 of the left hepatic lobe, which corresponds to the lesion seen on CT, and is favored to represent a focal area of fatty infiltration rather than neoplasm. Consider further evaluation with PET-CT, or short-term follow-up MRI in 3-4 months. No other sites of abdominal metastatic disease identified. Electronically Signed   By: JMarlaine HindM.D.   On: 09/20/2021 09:00    ASSESSMENT AND PLAN: This is a very pleasant 65years old white male recently diagnosed with stage IV (T1b, N3, M1 C) non-small cell lung cancer favoring adenocarcinoma presented with right upper lobe lung nodule in addition to right hilar, subcarinal and bilateral mediastinal as well as supraclavicular lymphadenopathy.  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  His PD-L1 expression is 80% and his molecular studies showed KRAS G12C mutation. The patient underwent SRS to metastatic brain lesion under the care of Dr. MTammi Klippeland he is currently undergoing systemic chemotherapy with carboplatin for AUC of  5, Alimta 500 Mg/M2 and Keytruda 200 Mg IV every 3 weeks status post 7 cycles.  Starting from cycle #5 the patient will be treated with maintenance treatment with Alimta and Keytruda every 3 weeks. The patient had MRI of the  liver for evaluation of the suspicious liver lesion and it showed severe diffuse geographic pattern of hepatic steatosis with questionable area of fatty infiltration rather than neoplasm. I recommended for the patient to continue his treatment with maintenance Alimta and Keytruda as planned and he will proceed with cycle #8 today. I will see him back for follow-up visit in 3 weeks for evaluation before starting cycle #9. He was advised to call immediately if he has any other concerning symptoms in the interval. The patient voices understanding of current disease status and treatment options and is in agreement with the current care plan.  All questions were answered. The patient knows to call the clinic with any problems, questions or concerns. We can certainly see the patient much sooner if necessary.  Disclaimer: This note was dictated with voice recognition software. Similar sounding words can inadvertently be transcribed and may not be corrected upon review.

## 2021-10-04 ENCOUNTER — Other Ambulatory Visit (HOSPITAL_COMMUNITY): Payer: Self-pay

## 2021-10-13 ENCOUNTER — Other Ambulatory Visit (HOSPITAL_COMMUNITY): Payer: Self-pay

## 2021-10-19 ENCOUNTER — Ambulatory Visit: Payer: 59 | Admitting: Family Medicine

## 2021-10-19 ENCOUNTER — Other Ambulatory Visit (HOSPITAL_COMMUNITY): Payer: Self-pay

## 2021-10-19 ENCOUNTER — Encounter: Payer: Self-pay | Admitting: Family Medicine

## 2021-10-19 VITALS — BP 127/84 | HR 109 | Temp 97.4°F | Ht 73.0 in | Wt 176.4 lb

## 2021-10-19 DIAGNOSIS — E1165 Type 2 diabetes mellitus with hyperglycemia: Secondary | ICD-10-CM | POA: Diagnosis not present

## 2021-10-19 DIAGNOSIS — R7303 Prediabetes: Secondary | ICD-10-CM

## 2021-10-19 LAB — BAYER DCA HB A1C WAIVED: HB A1C (BAYER DCA - WAIVED): 9.7 % — ABNORMAL HIGH (ref 4.8–5.6)

## 2021-10-19 MED ORDER — OZEMPIC (0.25 OR 0.5 MG/DOSE) 2 MG/1.5ML ~~LOC~~ SOPN
0.5000 mg | PEN_INJECTOR | SUBCUTANEOUS | 1 refills | Status: DC
  Filled 2021-10-19: qty 3, 56d supply, fill #0

## 2021-10-19 MED ORDER — FREESTYLE LIBRE 3 SENSOR MISC
1.0000 | 2 refills | Status: DC
Start: 2021-10-19 — End: 2022-01-17
  Filled 2021-10-19: qty 2, 28d supply, fill #0
  Filled 2021-11-11: qty 2, 28d supply, fill #1
  Filled 2021-12-21: qty 2, 28d supply, fill #2

## 2021-10-19 NOTE — Progress Notes (Signed)
Assessment & Plan:  1. Uncontrolled type 2 diabetes mellitus with hyperglycemia, without long-term current use of insulin (HCC) Lab Results  Component Value Date   HGBA1C 9.7 (H) 10/19/2021   HGBA1C 6.2 (H) 07/12/2021   HGBA1C 6.2 01/10/2021    - Diabetes is not at goal of A1c < 7.  - Medications:  initiate Ozempic 0.25 mg weekly - Home glucose monitoring: ordering FreeStyle Libre 3 today - Patient is currently taking a statin. Patient is taking an ACE-inhibitor/ARB.  - Instruction/counseling given: reminded to get eye exam and discussed diet  There are no preventive care reminders to display for this patient.  Lab Results  Component Value Date   LABMICR 4.8 03/09/2020   LABMICR 12.3 10/18/2016   MICROALBUR neg 07/13/2014   - Microalbumin / creatinine urine ratio  2. Prediabetes - escalated to diabetes today - Bayer DCA Hb A1c Waived   Return in about 4 weeks (around 11/16/2021) for DM.  [name], NP Student  I personally was present during the history, physical exam, and medical decision-making activities of this service and have verified that the service and findings are accurately documented in the nurse practitioner student's note.  Hendricks Limes, MSN, APRN, FNP-C [facility]  Subjective:    Patient ID: Bradley Goodman, male    DOB: 1955-10-12, 66 y.o.   MRN: [id number]  Patient Care Team: [name], FNP as PCP - General (Family Medicine) Gala Romney Cristopher Estimable, MD as Consulting Physician (Gastroenterology) Celestia Khat, [place] (Optometry) Valrie Hart, RN as Oncology Nurse Navigator (Oncology)   Chief Complaint:  Chief Complaint  Patient presents with   blood surgars elevated     Patient states that the past month his BS are 200 or over.     HPI: Bradley Goodman is a 66 y.o. male presenting on 10/19/2021 for blood surgars elevated  (Patient states that the past month his BS are 200 or over. )  He has been previous well controlled  with his prediabetes, but has been taking decadron daily for adrenal insufficiency due to his cancer treatment and his blood sugars have been running high at his oncology visits, 200s-250s. He is not currently on medication to control blood sugar. He states that he has been very thirsty and voiding frequently.   He states he has an eye exam scheduled next month, and will request them to send the records to this office.    Social history:  Relevant past medical, surgical, family and social history reviewed and updated as indicated. Interim medical history since our last visit reviewed.  Allergies and medications reviewed and updated.  DATA REVIEWED: CHART IN EPIC  ROS: Negative unless specifically indicated above in HPI.    Current Outpatient Medications:    cholecalciferol (VITAMIN D) 1000 UNITS tablet, Take 1,000 Units by mouth every evening., Disp: , Rfl:    dexamethasone (DECADRON) 1 MG tablet, Take 2 tablets by mouth daily., Disp: 180 tablet, Rfl: 1   docusate sodium (COLACE) 100 MG capsule, Take 100 mg by mouth daily., Disp: , Rfl:    folic acid (FOLVITE) 1 MG tablet, Take 1 tablet by mouth daily., Disp: 30 tablet, Rfl: 4   Krill Oil 300 MG CAPS, Take 300 mg by mouth every evening., Disp: , Rfl:    levothyroxine (SYNTHROID) 25 MCG tablet, Take 1 tablet (25 mcg total) by mouth daily before breakfast., Disp: 90 tablet, Rfl: 1   loratadine (CLARITIN) 10 MG tablet, Take 10 mg  by mouth every evening. , Disp: , Rfl:    losartan (COZAAR) 50 MG tablet, Take 1 and 1/2 tablets (75 mg total) by mouth daily., Disp: 135 tablet, Rfl: 1   magic mouthwash (nystatin, diphenhydrAMINE, alum & mag hydroxide) suspension mixture, Swish and spit 5 mls up to 4 times daily as needed., Disp: 240 mL, Rfl: 1   mirtazapine (REMERON) 15 MG tablet, Take 1 tablet (15 mg total) by mouth at bedtime., Disp: 90 tablet, Rfl: 1   Multiple Vitamins-Minerals (MULTIVITAMIN GUMMIES ADULT PO), Take 2 each by mouth daily.,  Disp: , Rfl:    pantoprazole (PROTONIX) 40 MG tablet, Take 1 tablet by mouth every evening., Disp: 30 tablet, Rfl: 5   prochlorperazine (COMPAZINE) 10 MG tablet, Take 1 tablet (10 mg total) by mouth every 6 (six) hours as needed for nausea or vomiting., Disp: 30 tablet, Rfl: 0   rosuvastatin (CRESTOR) 20 MG tablet, Take 1 tablet (20 mg total) by mouth daily., Disp: 90 tablet, Rfl: 1   SYRINGE-NEEDLE, DISP, 3 ML 21G X 1-1/2" 3 ML MISC, Use to inject testosterone every week, Disp: 100 each, Rfl: 2   testosterone cypionate (DEPO-TESTOSTERONE) 100 MG/ML injection, Inject 0.5 mLs (50 mg total) into the muscle every 7 (seven) days. For IM use only, Disp: 10 mL, Rfl: 0   venlafaxine XR (EFFEXOR-XR) 75 MG 24 hr capsule, Take 1 capsule (75 mg total) by mouth daily with breakfast., Disp: 90 capsule, Rfl: 1   No Known Allergies Past Medical History:  Diagnosis Date   Cervical spondylolysis    Essential hypertension    GERD (gastroesophageal reflux disease)    History of kidney stones    History of migraine    Hyperlipidemia    Hypertension    PONV (postoperative nausea and vomiting)    Type 2 diabetes mellitus ([place])     Past Surgical History:  Procedure Laterality Date   Bilateral inguinal hernia repair     BRONCHIAL NEEDLE ASPIRATION BIOPSY  04/05/2021   Procedure: BRONCHIAL NEEDLE ASPIRATION BIOPSIES;  Surgeon: Garner Nash, DO;  Location: [facility];  Service: Pulmonary;;   COLONOSCOPY  01/23/2012   Procedure: COLONOSCOPY;  Surgeon: Daneil Dolin, MD;  [facility];  Service: Endoscopy;  Laterality: N/A;  9:30 AM   COLONOSCOPY N/A 10/11/2015   Procedure: COLONOSCOPY;  Surgeon: Daneil Dolin, MD;  [facility];  Service: Endoscopy;  Laterality: N/A;  830   COLONOSCOPY N/A 10/14/2019   Procedure: COLONOSCOPY;  Surgeon: Daneil Dolin, MD;  [facility];  Service: Endoscopy;  Laterality: N/A;  1:45   POLYPECTOMY  10/14/2019   Procedure: POLYPECTOMY;  Surgeon:  Daneil Dolin, MD;  [facility];  Service: Endoscopy;;  ascending colon, descending colon   VIDEO BRONCHOSCOPY WITH ENDOBRONCHIAL ULTRASOUND N/A 04/05/2021   Procedure: VIDEO BRONCHOSCOPY WITH ENDOBRONCHIAL ULTRASOUND;  Surgeon: Garner Nash, DO;  Location: [facility];  Service: Pulmonary;  Laterality: N/A;    Social History   Socioeconomic History   Marital status: Married    Spouse name: Not on file   Number of children: Not on file   Years of education: Not on file   Highest education level: Not on file  Occupational History   Not on file  Tobacco Use   Smoking status: Every Day    Packs/day: 0.50    Years: 30.00    Pack years: 15.00    Types: Cigarettes    Start date: 10/30/1973  Smokeless tobacco: Never  Vaping Use   Vaping Use: Never used  Substance and Sexual Activity   Alcohol use: Yes    Alcohol/week: 0.0 standard drinks    Comment: One drink every 6 months.   Drug use: No   Sexual activity: Yes  Other Topics Concern   Not on file  Social History Narrative   Not on file   Social Determinants of Health   Financial Resource Strain: Not on file  Food Insecurity: Not on file  Transportation Needs: Not on file  Physical Activity: Not on file  Stress: Not on file  Social Connections: Not on file  Intimate Partner Violence: Not on file        Objective:    BP 127/84    Pulse (!) 109    Temp (!) 97.4 F (36.3 C) (Temporal)    Ht $R'6\' 1"'jN$  (1.854 m)    Wt 80 kg    SpO2 95%    BMI 23.27 kg/m   Wt Readings from Last 3 Encounters:  10/19/21 176 lb 6.4 oz (80 kg)  10/03/21 181 lb 4 oz (82.2 kg)  09/26/21 185 lb (83.9 kg)    Physical Exam Vitals reviewed.  Constitutional:      General: He is not in acute distress.    Appearance: Normal appearance. He is normal weight. He is not ill-appearing, toxic-appearing or diaphoretic.  HENT:     Head: Normocephalic and atraumatic.  Eyes:     General: No scleral icterus.       Right eye: No discharge.         Left eye: No discharge.     Extraocular Movements: Extraocular movements intact.     Conjunctiva/sclera: Conjunctivae normal.     Pupils: Pupils are equal, round, and reactive to light.  Cardiovascular:     Rate and Rhythm: Normal rate and regular rhythm.     Heart sounds: Normal heart sounds. No murmur heard.   No friction rub. No gallop.  Pulmonary:     Effort: Pulmonary effort is normal. No respiratory distress.     Breath sounds: Normal breath sounds. No stridor. No wheezing, rhonchi or rales.  Musculoskeletal:        General: Normal range of motion.     Cervical back: Normal range of motion.  Skin:    General: Skin is warm and dry.  Neurological:     Mental Status: He is alert and oriented to person, place, and time. Mental status is at baseline.  Psychiatric:        Mood and Affect: Mood normal.        Behavior: Behavior normal.        Thought Content: Thought content normal.        Judgment: Judgment normal.    Lab Results  Component Value Date   TSH 0.718 10/03/2021   Lab Results  Component Value Date   WBC 11.1 (H) 10/03/2021   HGB 13.3 10/03/2021   HCT 41.2 10/03/2021   MCV 97.6 10/03/2021   PLT 242 10/03/2021   Lab Results  Component Value Date   NA 137 10/03/2021   K 3.3 (L) 10/03/2021   CO2 28 10/03/2021   GLUCOSE 248 (H) 10/03/2021   BUN 13 10/03/2021   CREATININE 0.95 10/03/2021   BILITOT 0.4 10/03/2021   ALKPHOS 97 10/03/2021   AST 25 10/03/2021   ALT 45 (H) 10/03/2021   PROT 6.9 10/03/2021   ALBUMIN 4.1 10/03/2021   CALCIUM 9.4 10/03/2021  ANIONGAP 9 10/03/2021   EGFR 76 01/10/2021   Lab Results  Component Value Date   CHOL 136 07/12/2021   Lab Results  Component Value Date   HDL 56 07/12/2021   Lab Results  Component Value Date   LDLCALC 53 07/12/2021   Lab Results  Component Value Date   TRIG 161 (H) 07/12/2021   Lab Results  Component Value Date   CHOLHDL 2.4 07/12/2021   Lab Results  Component Value Date    HGBA1C 6.2 (H) 07/12/2021

## 2021-10-19 NOTE — Patient Instructions (Signed)
Please have your eye exam faxed over to [place] next month.

## 2021-10-20 ENCOUNTER — Telehealth: Payer: Self-pay | Admitting: Family Medicine

## 2021-10-20 DIAGNOSIS — E1165 Type 2 diabetes mellitus with hyperglycemia: Secondary | ICD-10-CM

## 2021-10-20 LAB — MICROALBUMIN / CREATININE URINE RATIO
Creatinine, Urine: 63.6 mg/dL
Microalb/Creat Ratio: 11 mg/g creat (ref 0–29)
Microalbumin, Urine: 7.3 ug/mL

## 2021-10-20 NOTE — Telephone Encounter (Signed)
(  Key: [id number]) Ozempic (0.25 or 0.5 MG/DOSE) 2MG /1.5ML pen-injectors.   PA in process

## 2021-10-21 ENCOUNTER — Other Ambulatory Visit (HOSPITAL_COMMUNITY): Payer: Self-pay

## 2021-10-22 ENCOUNTER — Encounter: Payer: Self-pay | Admitting: Family Medicine

## 2021-10-23 ENCOUNTER — Other Ambulatory Visit: Payer: Self-pay

## 2021-10-23 ENCOUNTER — Encounter: Payer: Self-pay | Admitting: Internal Medicine

## 2021-10-23 ENCOUNTER — Other Ambulatory Visit (HOSPITAL_COMMUNITY): Payer: Self-pay

## 2021-10-23 ENCOUNTER — Inpatient Hospital Stay: Payer: 59 | Attending: Physician Assistant

## 2021-10-23 ENCOUNTER — Inpatient Hospital Stay: Payer: 59 | Admitting: Internal Medicine

## 2021-10-23 ENCOUNTER — Encounter: Payer: Self-pay | Admitting: Family Medicine

## 2021-10-23 ENCOUNTER — Inpatient Hospital Stay: Payer: 59

## 2021-10-23 ENCOUNTER — Other Ambulatory Visit: Payer: Self-pay | Admitting: *Deleted

## 2021-10-23 VITALS — BP 135/87 | HR 98 | Temp 97.0°F | Resp 19 | Ht 73.0 in | Wt 176.7 lb

## 2021-10-23 DIAGNOSIS — C3491 Malignant neoplasm of unspecified part of right bronchus or lung: Secondary | ICD-10-CM

## 2021-10-23 DIAGNOSIS — C7931 Secondary malignant neoplasm of brain: Secondary | ICD-10-CM | POA: Insufficient documentation

## 2021-10-23 DIAGNOSIS — Z5112 Encounter for antineoplastic immunotherapy: Secondary | ICD-10-CM | POA: Insufficient documentation

## 2021-10-23 DIAGNOSIS — Z5111 Encounter for antineoplastic chemotherapy: Secondary | ICD-10-CM | POA: Insufficient documentation

## 2021-10-23 DIAGNOSIS — F17210 Nicotine dependence, cigarettes, uncomplicated: Secondary | ICD-10-CM | POA: Diagnosis not present

## 2021-10-23 DIAGNOSIS — Z79899 Other long term (current) drug therapy: Secondary | ICD-10-CM | POA: Insufficient documentation

## 2021-10-23 DIAGNOSIS — C3411 Malignant neoplasm of upper lobe, right bronchus or lung: Secondary | ICD-10-CM | POA: Insufficient documentation

## 2021-10-23 DIAGNOSIS — E538 Deficiency of other specified B group vitamins: Secondary | ICD-10-CM | POA: Insufficient documentation

## 2021-10-23 DIAGNOSIS — Z923 Personal history of irradiation: Secondary | ICD-10-CM

## 2021-10-23 DIAGNOSIS — C7951 Secondary malignant neoplasm of bone: Secondary | ICD-10-CM | POA: Insufficient documentation

## 2021-10-23 LAB — CMP (CANCER CENTER ONLY)
ALT: 43 U/L (ref 0–44)
AST: 33 U/L (ref 15–41)
Albumin: 4.1 g/dL (ref 3.5–5.0)
Alkaline Phosphatase: 97 U/L (ref 38–126)
Anion gap: 9 (ref 5–15)
BUN: 8 mg/dL (ref 8–23)
CO2: 27 mmol/L (ref 22–32)
Calcium: 9.2 mg/dL (ref 8.9–10.3)
Chloride: 102 mmol/L (ref 98–111)
Creatinine: 0.91 mg/dL (ref 0.61–1.24)
GFR, Estimated: >60 mL/min (ref >60)
Glucose, Bld: 312 mg/dL — ABNORMAL HIGH (ref 70–99)
Potassium: 4 mmol/L (ref 3.5–5.1)
Sodium: 138 mmol/L (ref 135–145)
Total Bilirubin: 0.4 mg/dL (ref 0.3–1.2)
Total Protein: 7.1 g/dL (ref 6.5–8.1)

## 2021-10-23 LAB — CBC WITH DIFFERENTIAL (CANCER CENTER ONLY)
Abs Immature Granulocytes: 0.03 10*3/uL (ref 0.00–0.07)
Basophils Absolute: 0 10*3/uL (ref 0.0–0.1)
Basophils Relative: 1 %
Eosinophils Absolute: 0.1 10*3/uL (ref 0.0–0.5)
Eosinophils Relative: 1 %
HCT: 44.7 % (ref 39.0–52.0)
Hemoglobin: 14.2 g/dL (ref 13.0–17.0)
Immature Granulocytes: 1 %
Lymphocytes Relative: 19 %
Lymphs Abs: 1.2 10*3/uL (ref 0.7–4.0)
MCH: 30.5 pg (ref 26.0–34.0)
MCHC: 31.8 g/dL (ref 30.0–36.0)
MCV: 95.9 fL (ref 80.0–100.0)
Monocytes Absolute: 0.4 10*3/uL (ref 0.1–1.0)
Monocytes Relative: 6 %
Neutro Abs: 4.4 10*3/uL (ref 1.7–7.7)
Neutrophils Relative %: 72 %
Platelet Count: 229 10*3/uL (ref 150–400)
RBC: 4.66 MIL/uL (ref 4.22–5.81)
RDW: 15.4 % (ref 11.5–15.5)
WBC Count: 6.1 10*3/uL (ref 4.0–10.5)
nRBC: 0 % (ref 0.0–0.2)

## 2021-10-23 LAB — TSH: TSH: 0.778 u[IU]/mL (ref 0.320–4.118)

## 2021-10-23 MED ORDER — SODIUM CHLORIDE 0.9 % IV SOLN: Freq: Once | INTRAVENOUS | Status: AC

## 2021-10-23 MED ORDER — PROCHLORPERAZINE MALEATE 10 MG PO TABS
10.0000 mg | ORAL_TABLET | Freq: Once | ORAL | Status: AC
  Administered 2021-10-23: 10 mg via ORAL
  Filled 2021-10-23: qty 1

## 2021-10-23 MED ORDER — SODIUM CHLORIDE 0.9 % IV SOLN
500.0000 mg/m2 | Freq: Once | INTRAVENOUS | Status: AC
  Administered 2021-10-23: 1000 mg via INTRAVENOUS
  Filled 2021-10-23: qty 40

## 2021-10-23 MED ORDER — SODIUM CHLORIDE 0.9 % IV SOLN
200.0000 mg | Freq: Once | INTRAVENOUS | Status: AC
  Administered 2021-10-23: 200 mg via INTRAVENOUS
  Filled 2021-10-23: qty 200

## 2021-10-23 MED ORDER — CYANOCOBALAMIN 1000 MCG/ML IJ SOLN
1000.0000 ug | Freq: Once | INTRAMUSCULAR | Status: AC
  Administered 2021-10-23: 1000 ug via INTRAMUSCULAR
  Filled 2021-10-23: qty 1

## 2021-10-23 MED ORDER — NYSTATIN 100000 UNIT/ML MT SUSP
OROMUCOSAL | 1 refills | Status: DC
  Filled 2021-10-23: qty 240, 12d supply, fill #0

## 2021-10-23 NOTE — Patient Instructions (Signed)
[facility]  Discharge Instructions: Thank you for choosing [facility] to provide your oncology and hematology care.   If you have a lab appointment with the Cancer Center, please go directly to the Cancer Center and check in at the registration area.   Wear comfortable clothing and clothing appropriate for easy access to any Portacath or PICC line.   We strive to give you quality time with your provider. You may need to reschedule your appointment if you arrive late (15 or more minutes).  Arriving late affects you and other patients whose appointments are after yours.  Also, if you miss three or more appointments without notifying the office, you may be dismissed from the clinic at the provider's discretion.      For prescription refill requests, have your pharmacy contact our office and allow 72 hours for refills to be completed.    Today you received the following chemotherapy and/or immunotherapy agents Pembrolizumab (Keytruda) and Pemetrexed (Alimta)      To help prevent nausea and vomiting after your treatment, we encourage you to take your nausea medication as directed.  BELOW ARE SYMPTOMS THAT SHOULD BE REPORTED IMMEDIATELY: *FEVER GREATER THAN 100.4 F (38 C) OR HIGHER *CHILLS OR SWEATING *NAUSEA AND VOMITING THAT IS NOT CONTROLLED WITH YOUR NAUSEA MEDICATION *UNUSUAL SHORTNESS OF BREATH *UNUSUAL BRUISING OR BLEEDING *URINARY PROBLEMS (pain or burning when urinating, or frequent urination) *BOWEL PROBLEMS (unusual diarrhea, constipation, pain near the anus) TENDERNESS IN MOUTH AND THROAT WITH OR WITHOUT PRESENCE OF ULCERS (sore throat, sores in mouth, or a toothache) UNUSUAL RASH, SWELLING OR PAIN  UNUSUAL VAGINAL DISCHARGE OR ITCHING   Items with * indicate a potential emergency and should be followed up as soon as possible or go to the Emergency Department if any problems should occur.  Please show the CHEMOTHERAPY ALERT CARD or  IMMUNOTHERAPY ALERT CARD at check-in to the Emergency Department and triage nurse.  Should you have questions after your visit or need to cancel or reschedule your appointment, please contact [facility]  Dept: [phone]  and follow the prompts.  Office hours are 8:00 a.m. to 4:30 p.m. Monday - Friday. Please note that voicemails left after 4:00 p.m. may not be returned until the following business day.  We are closed weekends and major holidays. You have access to a nurse at all times for urgent questions. Please call the main number to the clinic Dept: [phone] and follow the prompts.   For any non-urgent questions, you may also contact your provider using MyChart. We now offer e-Visits for anyone 18 and older to request care online for non-urgent symptoms. For details visit [email or web address].   Also download the MyChart app! Go to the app store, search "MyChart", open the app, select [facility], and log in with your MyChart username and password.  Due to Covid, a mask is required upon entering the hospital/clinic. If you do not have a mask, one will be given to you upon arrival. For doctor visits, patients may have 1 support person aged 18 or older with them. For treatment visits, patients cannot have anyone with them due to current Covid guidelines and our immunocompromised population.   

## 2021-10-23 NOTE — Patient Instructions (Signed)
Steps to Quit Smoking Smoking tobacco is the leading cause of preventable death. It can affect almost every organ in the body. Smoking puts you and people around you at risk for many serious, long-lasting (chronic) diseases. Quitting smoking can be hard, but it is one of the best things that you can do for your health. It is never too late to quit. How do I get ready to quit? When you decide to quit smoking, make a plan to help you succeed. Before you quit: Pick a date to quit. Set a date within the next 2 weeks to give you time to prepare. Write down the reasons why you are quitting. Keep this list in places where you will see it often. Tell your family, friends, and co-workers that you are quitting. Their support is important. Talk with your doctor about the choices that may help you quit. Find out if your health insurance will pay for these treatments. Know the people, places, things, and activities that make you want to smoke (triggers). Avoid them. What first steps can I take to quit smoking? Throw away all cigarettes at home, at work, and in your car. Throw away the things that you use when you smoke, such as ashtrays and lighters. Clean your car. Make sure to empty the ashtray. Clean your home, including curtains and carpets. What can I do to help me quit smoking? Talk with your doctor about taking medicines and seeing a counselor at the same time. You are more likely to succeed when you do both. If you are pregnant or breastfeeding, talk with your doctor about counseling or other ways to quit smoking. Do not take medicine to help you quit smoking unless your doctor tells you to do so. To quit smoking: Quit right away Quit smoking totally, instead of slowly cutting back on how much you smoke over a period of time. Go to counseling. You are more likely to quit if you go to counseling sessions regularly. Take medicine You may take medicines to help you quit. Some medicines need a  prescription, and some you can buy over-the-counter. Some medicines may contain a drug called nicotine to replace the nicotine in cigarettes. Medicines may: Help you to stop having the desire to smoke (cravings). Help to stop the problems that come when you stop smoking (withdrawal symptoms). Your doctor may ask you to use: Nicotine patches, gum, or lozenges. Nicotine inhalers or sprays. Non-nicotine medicine that is taken by mouth. Find resources Find resources and other ways to help you quit smoking and remain smoke-free after you quit. These resources are most helpful when you use them often. They include: Online chats with a counselor. Phone quitlines. Printed self-help materials. Support groups or group counseling. Text messaging programs. Mobile phone apps. Use apps on your mobile phone or tablet that can help you stick to your quit plan. There are many free apps for mobile phones and tablets as well as websites. Examples include Quit Guide from the CDC and smokefree.gov  What things can I do to make it easier to quit?  Talk to your family and friends. Ask them to support and encourage you. Call a phone quitline (1-800-QUIT-NOW), reach out to support groups, or work with a counselor. Ask people who smoke to not smoke around you. Avoid places that make you want to smoke, such as: Bars. Parties. Smoke-break areas at work. Spend time with people who do not smoke. Lower the stress in your life. Stress can make you want to   smoke. Try these things to help your stress: Getting regular exercise. Doing deep-breathing exercises. Doing yoga. Meditating. Doing a body scan. To do this, close your eyes, focus on one area of your body at a time from head to toe. Notice which parts of your body are tense. Try to relax the muscles in those areas. How will I feel when I quit smoking? Day 1 to 3 weeks Within the first 24 hours, you may start to have some problems that come from quitting tobacco.  These problems are very bad 2-3 days after you quit, but they do not often last for more than 2-3 weeks. You may get these symptoms: Mood swings. Feeling restless, nervous, angry, or annoyed. Trouble concentrating. Dizziness. Strong desire for high-sugar foods and nicotine. Weight gain. Trouble pooping (constipation). Feeling like you may vomit (nausea). Coughing or a sore throat. Changes in how the medicines that you take for other issues work in your body. Depression. Trouble sleeping (insomnia). Week 3 and afterward After the first 2-3 weeks of quitting, you may start to notice more positive results, such as: Better sense of smell and taste. Less coughing and sore throat. Slower heart rate. Lower blood pressure. Clearer skin. Better breathing. Fewer sick days. Quitting smoking can be hard. Do not give up if you fail the first time. Some people need to try a few times before they succeed. Do your best to stick to your quit plan, and talk with your doctor if you have any questions or concerns. Summary Smoking tobacco is the leading cause of preventable death. Quitting smoking can be hard, but it is one of the best things that you can do for your health. When you decide to quit smoking, make a plan to help you succeed. Quit smoking right away, not slowly over a period of time. When you start quitting, seek help from your doctor, family, or friends. This information is not intended to replace advice given to you by your health care provider. Make sure you discuss any questions you have with your health care provider. Document Revised: 06/02/2021 Document Reviewed: 12/13/2018 Elsevier Patient Education  2022 Elsevier Inc.  

## 2021-10-23 NOTE — Progress Notes (Signed)
[facility] Telephone:([phone]) [phone]   Fax:([phone]) [phone]  OFFICE PROGRESS NOTE  Bradley Goodman, Bradley Goodman [id number]  DIAGNOSIS: Stage IV (T1b, N3, M1C) non-small cell lung cancer, favoring adenocarcinoma presented with right upper lobe lung nodule in addition to right hilar, subcarinal and bilateral mediastinal as well as supraclavicular lymphadenopathy in addition to bone and brain metastasis diagnosed in June 2022.     PD-L1 expression 80%.     Molecular Studies:  Biomarker Findings Microsatellite status - MS-Stable Tumor Mutational Burden - 6 Muts/Mb Genomic Findings For a complete list of the genes assayed, please refer to the Appendix. KRAS G12C, amplification ATM S470* CCND1 amplification - equivocal HGF amplification - equivocal MYC amplification - equivocal FGF19 amplification - equivocal FGF3 amplification - equivocal FGF4 amplification - equivocal NFKBIA amplification NKX2-1 amplification RAD21 amplification - equivocal [id number]*26 TERT promoter -124C>T TP53 rearrangement exon 9 7 Disease relevant genes with no reportable alterations: ALK, BRAF, EGFR, ERBB2, MET, RET, ROS1   PRIOR THERAPY: SRS to the metastatic brain lesions under the care of Dr. MTammi Klippel  Last treatment on 05/04/2021.   CURRENT THERAPY: Palliative systemic chemotherapy with carboplatin for an AUC 5, Alimta 500 mg/m2 and, Keytruda 200 mg IV every 3 weeks.  First dose expected on 05/08/2021.  Status post 8 cycles.  Starting from cycle #5 he is on maintenance treatment with Alimta and Keytruda every 3 weeks.  INTERVAL HISTORY: Bradley Goodman 66y.o. male returns to the clinic today for follow-up visit accompanied by his wife.  The patient is feeling fine today with no concerning complaints.  He denied having any current chest pain, shortness of breath, cough or hemoptysis.  He denied having any fever or chills.  He has no nausea, vomiting,  diarrhea or constipation.  He has no headache or visual changes.  He lost few pounds recently.  The patient is expected to start Ozempic for his steroid induced diabetes soon.  He is here today for evaluation before starting cycle #9 of his treatment.   MEDICAL HISTORY: Past Medical History:  Diagnosis Date   Cervical spondylolysis    Essential hypertension    GERD (gastroesophageal reflux disease)    History of kidney stones    History of migraine    Hyperlipidemia    Hypertension    PONV (postoperative nausea and vomiting)    Type 2 diabetes mellitus (HCC)     ALLERGIES:  has No Known Allergies.  MEDICATIONS:  Current Outpatient Medications  Medication Sig Dispense Refill   cholecalciferol (VITAMIN D) 1000 UNITS tablet Take 1,000 Units by mouth every evening.     Continuous Blood Gluc Sensor (FREESTYLE LIBRE 3 SENSOR) MISC Place 1 sensor on the skin every 14 days. Use to check glucose continuously 2 each 2   dexamethasone (DECADRON) 1 MG tablet Take 2 tablets by mouth daily. 180 tablet 1   docusate sodium (COLACE) 100 MG capsule Take 100 mg by mouth daily.     folic acid (FOLVITE) 1 MG tablet Take 1 tablet by mouth daily. 30 tablet 4   Krill Oil 300 MG CAPS Take 300 mg by mouth every evening.     levothyroxine (SYNTHROID) 25 MCG tablet Take 1 tablet (25 mcg total) by mouth daily before breakfast. 90 tablet 1   loratadine (CLARITIN) 10 MG tablet Take 10 mg by mouth every evening.      losartan (COZAAR) 50 MG tablet Take 1 and 1/2 tablets (75  mg total) by mouth daily. 135 tablet 1   magic mouthwash (nystatin, diphenhydrAMINE, alum & mag hydroxide) suspension mixture Swish and spit 5 mls up to 4 times daily as needed. 240 mL 1   mirtazapine (REMERON) 15 MG tablet Take 1 tablet (15 mg total) by mouth at bedtime. 90 tablet 1   Multiple Vitamins-Minerals (MULTIVITAMIN GUMMIES ADULT PO) Take 2 each by mouth daily.     pantoprazole (PROTONIX) 40 MG tablet Take 1 tablet by mouth every  evening. 30 tablet 5   prochlorperazine (COMPAZINE) 10 MG tablet Take 1 tablet (10 mg total) by mouth every 6 (six) hours as needed for nausea or vomiting. 30 tablet 0   rosuvastatin (CRESTOR) 20 MG tablet Take 1 tablet (20 mg total) by mouth daily. 90 tablet 1   Semaglutide,0.25 or 0.5MG/DOS, (OZEMPIC, 0.25 OR 0.5 MG/DOSE,) 2 MG/1.5ML SOPN Inject 0.5 mg into the skin once a week. 3 mL 1   SYRINGE-NEEDLE, DISP, 3 ML 21G X 1-1/2" 3 ML MISC Use to inject testosterone every week 100 each 2   testosterone cypionate (DEPO-TESTOSTERONE) 100 MG/ML injection Inject 0.5 mLs (50 mg total) into the muscle every 7 (seven) days. For IM use only 10 mL 0   venlafaxine XR (EFFEXOR-XR) 75 MG 24 hr capsule Take 1 capsule (75 mg total) by mouth daily with breakfast. 90 capsule 1   No current facility-administered medications for this visit.    SURGICAL HISTORY:  Past Surgical History:  Procedure Laterality Date   Bilateral inguinal hernia repair     BRONCHIAL NEEDLE ASPIRATION BIOPSY  04/05/2021   Procedure: BRONCHIAL NEEDLE ASPIRATION BIOPSIES;  Surgeon: Garner Nash, DO;  Location: [facility];  Service: Pulmonary;;   COLONOSCOPY  01/23/2012   Procedure: COLONOSCOPY;  Surgeon: Daneil Dolin, MD;  [facility];  Service: Endoscopy;  Laterality: N/A;  9:30 AM   COLONOSCOPY N/A 10/11/2015   Procedure: COLONOSCOPY;  Surgeon: Daneil Dolin, MD;  [facility];  Service: Endoscopy;  Laterality: N/A;  830   COLONOSCOPY N/A 10/14/2019   Procedure: COLONOSCOPY;  Surgeon: Daneil Dolin, MD;  [facility];  Service: Endoscopy;  Laterality: N/A;  1:45   POLYPECTOMY  10/14/2019   Procedure: POLYPECTOMY;  Surgeon: Daneil Dolin, MD;  [facility];  Service: Endoscopy;;  ascending colon, descending colon   VIDEO BRONCHOSCOPY WITH ENDOBRONCHIAL ULTRASOUND N/A 04/05/2021   Procedure: VIDEO BRONCHOSCOPY WITH ENDOBRONCHIAL ULTRASOUND;  Surgeon: Garner Nash, DO;  Location: [facility];  Service: Pulmonary;  Laterality: N/A;    REVIEW OF SYSTEMS:  A comprehensive review of systems was negative.   PHYSICAL EXAMINATION: General appearance: alert, cooperative, appears stated age, fatigued, and no distress Head: Normocephalic, without obvious abnormality, atraumatic Neck: no adenopathy, no JVD, supple, symmetrical, trachea midline, and thyroid not enlarged, symmetric, no tenderness/mass/nodules Lymph nodes: Cervical, supraclavicular, and axillary nodes normal. Resp: clear to auscultation bilaterally Back: symmetric, no curvature. ROM normal. No CVA tenderness. Cardio: regular rate and rhythm, S1, S2 normal, no murmur, click, rub or gallop GI: soft, non-tender; bowel sounds normal; no masses,  no organomegaly Extremities: extremities normal, atraumatic, no cyanosis or edema  ECOG PERFORMANCE STATUS: 1 - Symptomatic but completely ambulatory  Blood pressure 135/87, pulse 98, temperature (!) 97 F (36.1 C), temperature source Tympanic, resp. rate 19, height _0  (1.854 m), weight 176 lb 11.2 oz (80.2 kg), SpO2 99 %.  LABORATORY DATA: Lab Results  Component Value Date   WBC  6.1 10/23/2021   HGB 14.2 10/23/2021   HCT 44.7 10/23/2021   MCV 95.9 10/23/2021   PLT 229 10/23/2021      Chemistry      Component Value Date/Time   NA 137 10/03/2021 1313   NA 144 01/10/2021 0849   K 3.3 (L) 10/03/2021 1313   CL 100 10/03/2021 1313   CO2 28 10/03/2021 1313   BUN 13 10/03/2021 1313   BUN 15 01/10/2021 0849   CREATININE 0.95 10/03/2021 1313      Component Value Date/Time   CALCIUM 9.4 10/03/2021 1313   ALKPHOS 97 10/03/2021 1313   AST 25 10/03/2021 1313   ALT 45 (H) 10/03/2021 1313   BILITOT 0.4 10/03/2021 1313       RADIOGRAPHIC STUDIES: No results found.  ASSESSMENT AND PLAN: This is a very pleasant 66 years old white male recently diagnosed with stage IV (T1b, N3, M1 C) non-small cell lung cancer favoring adenocarcinoma presented with right upper lobe  lung nodule in addition to right hilar, subcarinal and bilateral mediastinal as well as supraclavicular lymphadenopathy.  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  His PD-L1 expression is 80% and his molecular studies showed KRAS G12C mutation. The patient underwent SRS to metastatic brain lesion under the care of Dr. Tammi Klippel and he is currently undergoing systemic chemotherapy with carboplatin for AUC of 5, Alimta 500 Mg/M2 and Keytruda 200 Mg IV every 3 weeks status post 8 cycles.  Starting from cycle #5 the patient will be treated with maintenance treatment with Alimta and Keytruda every 3 weeks. The patient continues to tolerate this treatment well with no concerning adverse effects. I recommended for him to proceed with cycle #9 of his treatment as planned. I will see him back for follow-up visit in 3 weeks for evaluation before starting cycle #10.  I would consider repeating his imaging studies after cycle #10. The patient was advised to call immediately if he has any other concerning symptoms in the interval. The patient voices understanding of current disease status and treatment options and is in agreement with the current care plan.  All questions were answered. The patient knows to call the clinic with any problems, questions or concerns. We can certainly see the patient much sooner if necessary.  Disclaimer: This note was dictated with voice recognition software. Similar sounding words can inadvertently be transcribed and may not be corrected upon review.

## 2021-10-25 ENCOUNTER — Other Ambulatory Visit (HOSPITAL_COMMUNITY): Payer: Self-pay

## 2021-10-25 MED ORDER — METFORMIN HCL ER 500 MG PO TB24
500.0000 mg | ORAL_TABLET | Freq: Two times a day (BID) | ORAL | 2 refills | Status: DC
  Filled 2021-10-25: qty 60, 30d supply, fill #0

## 2021-10-25 NOTE — Telephone Encounter (Signed)
Please make patient aware his insurance is requiring he try metformin and will not approve Ozempic. I have sent metformin 500 mg BID to the pharmacy. I would like him to start with once daily for the first week, then he can increase to twice daily.

## 2021-10-25 NOTE — Telephone Encounter (Signed)
Lmtcb.

## 2021-10-25 NOTE — Telephone Encounter (Signed)
Message from Plan This request has not been approved. Based on the information submitted for review, you did not meet our guideline rules for the requested drug. In order for your request to be approved, your provider would need to show that you have met the guideline rules below. The details below are written in medical language. If you have questions, please contact your provider. In some cases, the requested medication or alternatives offered may have additional approval requirements. Your provider requested Ozempic to manage your condition of type 2 diabetes mellitus (a chronic condition in which your body is resistant to insulin, a hormone that regulates your blood sugar).For the treatment of type 2 diabetes mellitus, our guideline named GLP-1 STEP OVERRIDE (reviewed for Ozempic) requires that you have tried taking metformin, a metformin combination product, a covered sulfonylurea (such as glimepiride, glipizide, or glyburide), a sulfonylurea combination product, pioglitazone OR a pioglitazone combination product, unless there is a medical reason why you cannot (contraindication to).This request was denied because we did not receive information that you meet the requirement listed above. Please work with your doctor to use a different medication or get [place] more information if it will allow [place] to approve this request. A written notification letter will follow with additional details.

## 2021-10-30 NOTE — Telephone Encounter (Signed)
Lmtcb   No call back  Detailed letter mailed.

## 2021-11-01 ENCOUNTER — Telehealth: Payer: Self-pay | Admitting: Internal Medicine

## 2021-11-01 NOTE — Telephone Encounter (Signed)
Pt aware - on the metformin and BS numbers are lowering. Aware to keep follow ups as planned

## 2021-11-01 NOTE — Telephone Encounter (Signed)
R/s pt's appt time with Dr. Mickeal Skinner on 2/7. Pt is aware of new appt time.

## 2021-11-02 ENCOUNTER — Other Ambulatory Visit (HOSPITAL_COMMUNITY): Payer: Self-pay

## 2021-11-10 ENCOUNTER — Ambulatory Visit
Admission: RE | Admit: 2021-11-10 | Discharge: 2021-11-10 | Disposition: A | Discharge status: Home / Self Care | Payer: 59 | Source: Ambulatory Visit | Attending: Internal Medicine | Admitting: Internal Medicine

## 2021-11-10 ENCOUNTER — Other Ambulatory Visit: Payer: Self-pay

## 2021-11-10 DIAGNOSIS — G939 Disorder of brain, unspecified: Secondary | ICD-10-CM | POA: Diagnosis not present

## 2021-11-10 DIAGNOSIS — C7931 Secondary malignant neoplasm of brain: Secondary | ICD-10-CM

## 2021-11-10 DIAGNOSIS — C3490 Malignant neoplasm of unspecified part of unspecified bronchus or lung: Secondary | ICD-10-CM | POA: Diagnosis not present

## 2021-11-10 DIAGNOSIS — C719 Malignant neoplasm of brain, unspecified: Secondary | ICD-10-CM | POA: Diagnosis not present

## 2021-11-10 IMAGING — MR MR HEAD WO/W CM
13 series · 48 of 48 positions shown · IV contrast (multihance)
Comparison: [DATE]

CLINICAL DATA: Brain/CNS neoplasm, assess treatment response;
metastatic lung cancer

EXAM:
MRI HEAD WITHOUT AND WITH CONTRAST
TECHNIQUE: Multiplanar, multiecho pulse sequences of the brain and surrounding
structures were obtained without and with intravenous contrast.
CONTRAST:  17mL MULTIHANCE GADOBENATE DIMEGLUMINE 529 MG/ML IV SOLN

[Series 2: FLAIR · sagittal · 3.0mm · 0.75mm/px · 3 of 39 slices shown (1 of 2)]
[im 1/39]
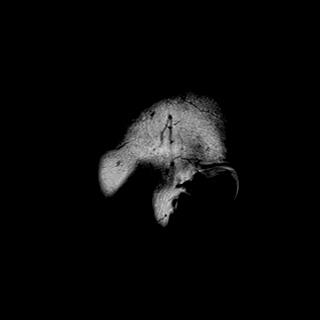
[im 20/39]
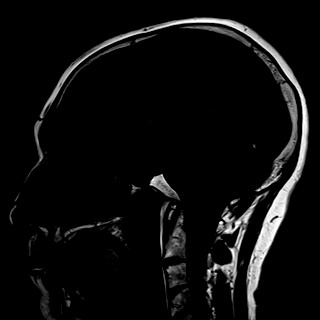
[im 39/39]
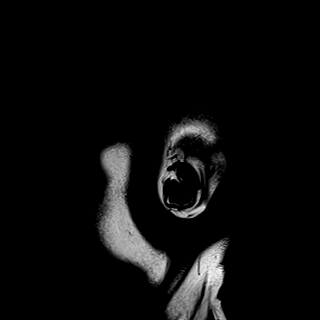

[Series 3: DWI · axial · 3.0mm · 1.50mm/px · z∈[-54,+102]mm · 3 of 82 slices shown (1 of 2)]
[im 1/82]
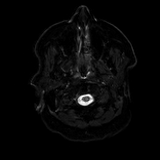
[im 41/82]
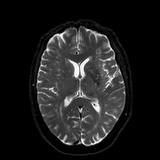
[im 82/82]
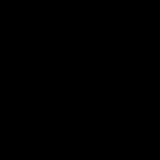

[Series 4: DWI · axial · 3.0mm · 1.50mm/px · z∈[-54,+102]mm · 2 of 40 slices shown (2 of 2)]
[im 1/40]
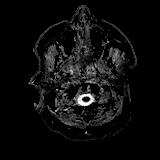
[im 40/40]
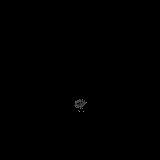

[Series 5: T2 · axial · 5.0mm · 0.57mm/px · 1 of 29 slices shown (1 of 2)]
[im 1/29]
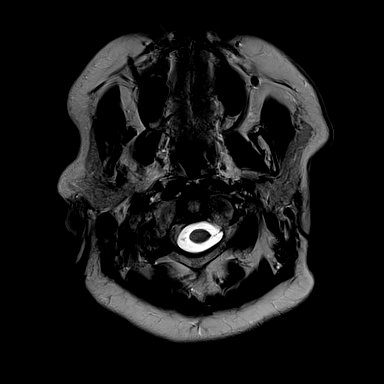

[Series 6: swi_images · axial · 1.5mm · 0.90mm/px · z∈[-60,+107]mm · 5 of 112 slices shown]
[im 1/112]
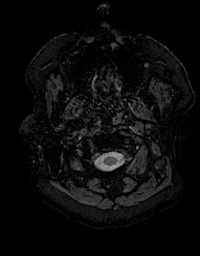
[im 28/112]
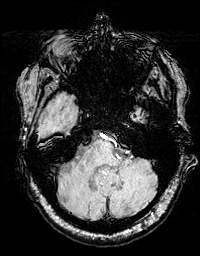
[im 56/112]
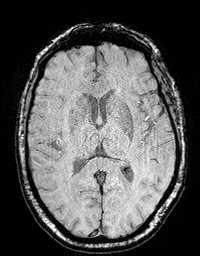
[im 84/112]
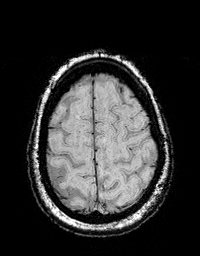
[im 112/112]
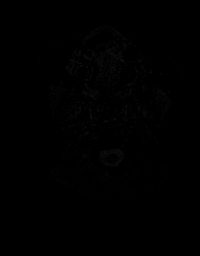

[Series 7: mip_images(sw) · axial · 12.0mm · 0.90mm/px · z∈[-54,+102]mm · 4 of 105 slices shown]
[im 1/105]
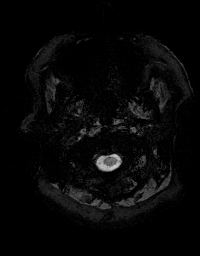
[im 35/105]
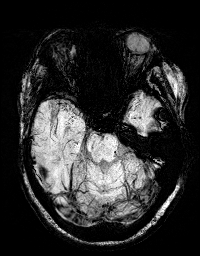
[im 70/105]
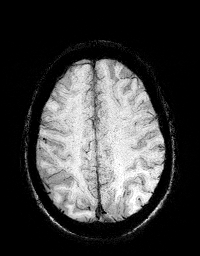
[im 105/105]
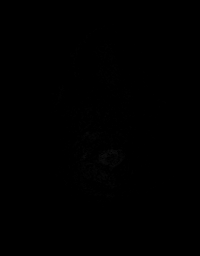

[Series 8: FLAIR · axial · 3.0mm · 0.86mm/px · z∈[-81,+111]mm · 3 of 63 slices shown (2 of 2)]
[im 1/63]
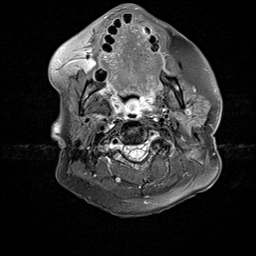
[im 32/63]
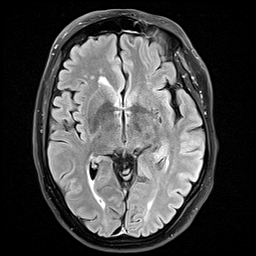
[im 63/63]
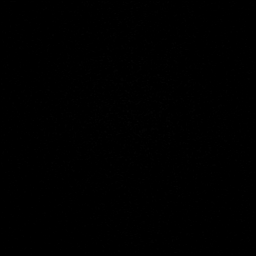

[Series 9: T2 · axial · non-contrast · 1.0mm · 0.86mm/px · z∈[-62,+110]mm · 7 of 176 slices shown (2 of 2)]
[im 1/176]
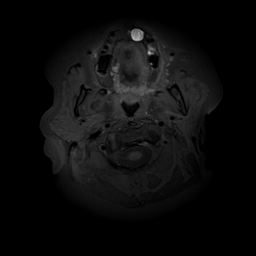
[im 30/176]
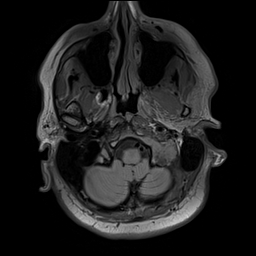
[im 59/176]
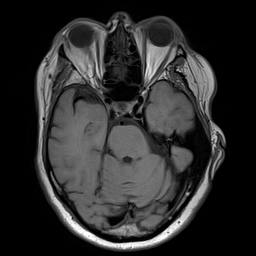
[im 88/176]
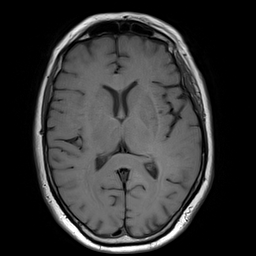
[im 117/176]
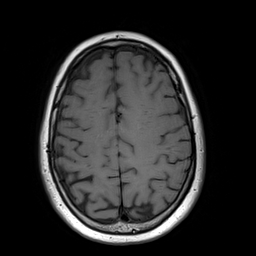
[im 146/176]
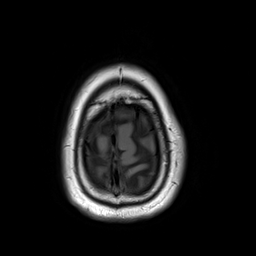
[im 176/176]
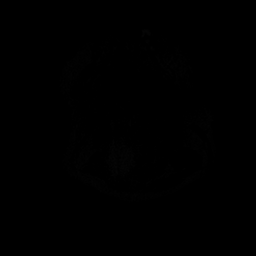

[Series 10: T2 post-contrast · coronal · 3.0mm · 0.57mm/px · 2 of 55 slices shown (1 of 2)]
[im 1/55]
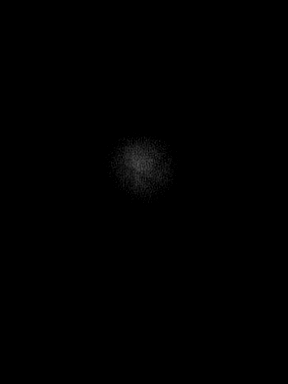
[im 55/55]
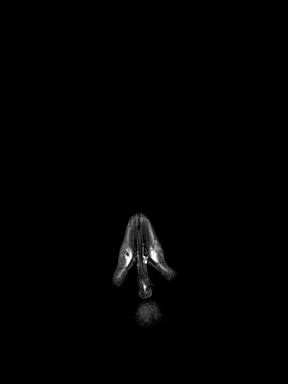

[Series 11: T2 post-contrast · axial · 1.0mm · 0.86mm/px · z∈[-62,+110]mm · 7 of 176 slices shown (2 of 2)]
[im 1/176]
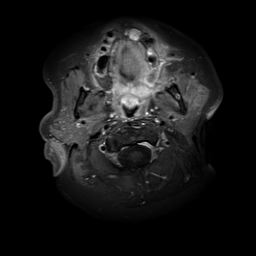
[im 30/176]
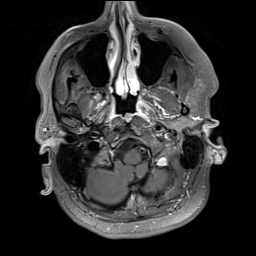
[im 59/176]
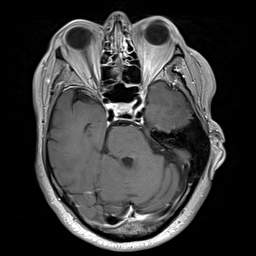
[im 88/176]
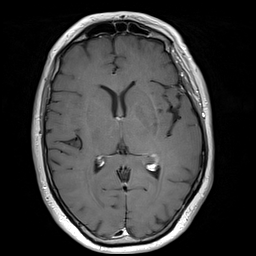
[im 117/176]
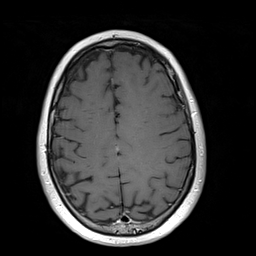
[im 146/176]
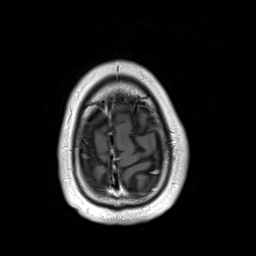
[im 176/176]
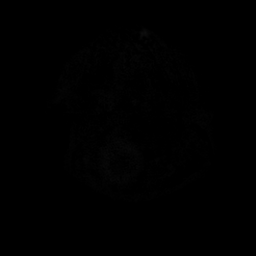

[Series 12: T1 post-contrast · axial · 1.0mm · 0.75mm/px · z∈[-56,+103]mm · 7 of 160 slices shown (1 of 2)]
[im 1/160]
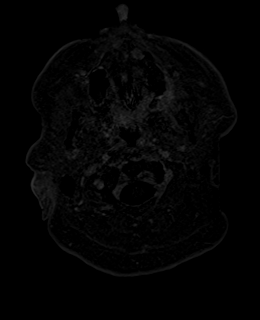
[im 27/160]
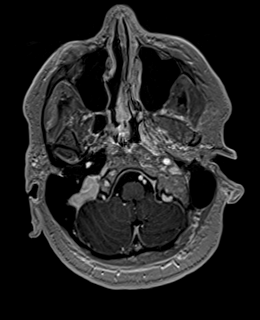
[im 54/160]
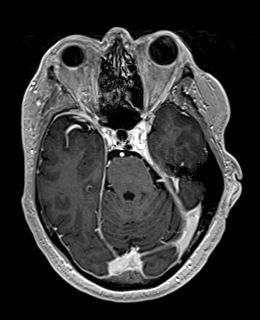
[im 80/160]
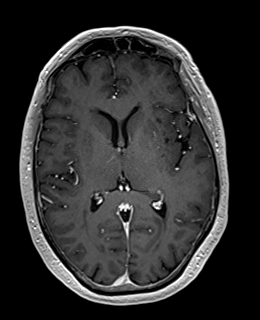
[im 107/160]
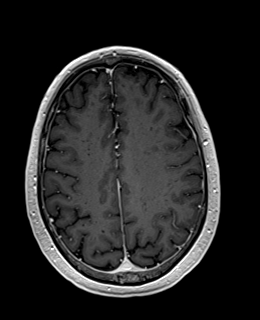
[im 133/160]
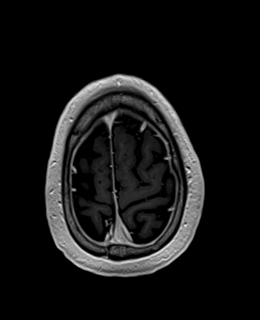
[im 160/160]
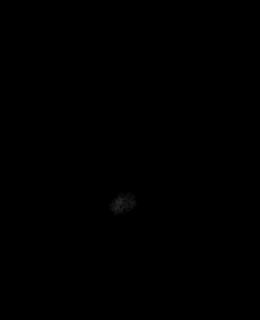

[Series 13: T1 post-contrast · coronal · 3.0mm · 0.57mm/px · 2 of 55 slices shown (2 of 2)]
[im 1/55]
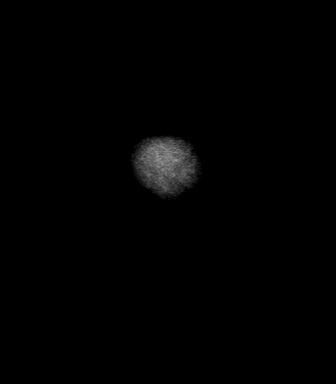
[im 55/55]
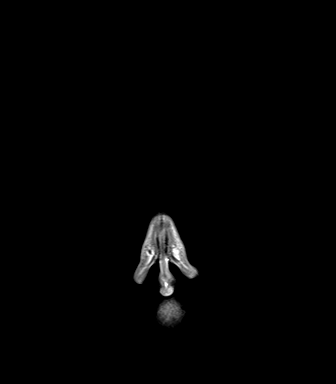

[Series 14: FLAIR post-contrast · sagittal · 3.0mm · 0.75mm/px · 2 of 39 slices shown]
[im 1/39]
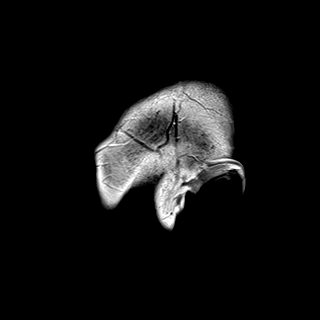
[im 39/39]
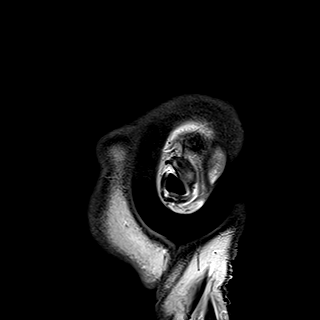

[48 of 48 positions shown; findings below may reference images not displayed]

FINDINGS: Brain: Previously described right parieto-occipital enhancing lesion
is likely further decreased in size from 2 mm to 1 mm (series 11,
image 93).

Stable 2 mm left cerebellar enhancing lesion (series 11, image 54).

No other abnormal enhancement is identified.

There is no acute infarction or intracranial hemorrhage. There is no
hydrocephalus or extra-axial fluid collection. Ventricles and sulci
are stable in size and configuration. Patchy and confluent areas of
T2 hyperintensity in the supratentorial and pontine white matter are
nonspecific but probably reflect similar chronic microvascular
ischemic changes.

Vascular: Major vessel flow voids at the skull base are preserved.

Skull and upper cervical spine: Normal marrow signal is preserved.

Sinuses/Orbits: Paranasal sinuses are aerated. Orbits are
unremarkable.

Other: Further improvement in appearance of the pituitary stalk,
which no longer appears thickened on these large field-of-view
images. Mastoid air cells are clear.
IMPRESSION: Decrease in size of now punctate right parieto-occipital lesion.
Stable small left cerebellar lesion. Further improvement in
appearance of the pituitary stalk.

No evidence of new metastatic disease.

## 2021-11-10 MED ORDER — GADOBENATE DIMEGLUMINE 529 MG/ML IV SOLN
17.0000 mL | Freq: Once | INTRAVENOUS | Status: AC | PRN
  Administered 2021-11-10: 17 mL via INTRAVENOUS

## 2021-11-13 ENCOUNTER — Inpatient Hospital Stay: Payer: 59 | Attending: Physician Assistant

## 2021-11-13 ENCOUNTER — Other Ambulatory Visit: Payer: Self-pay

## 2021-11-13 ENCOUNTER — Inpatient Hospital Stay: Payer: 59

## 2021-11-13 ENCOUNTER — Other Ambulatory Visit (HOSPITAL_COMMUNITY): Payer: Self-pay

## 2021-11-13 ENCOUNTER — Inpatient Hospital Stay: Payer: 59 | Admitting: Internal Medicine

## 2021-11-13 VITALS — BP 127/74 | HR 103 | Temp 97.2°F | Resp 19 | Ht 73.0 in | Wt 177.5 lb

## 2021-11-13 VITALS — HR 99

## 2021-11-13 DIAGNOSIS — F17210 Nicotine dependence, cigarettes, uncomplicated: Secondary | ICD-10-CM | POA: Diagnosis not present

## 2021-11-13 DIAGNOSIS — C3411 Malignant neoplasm of upper lobe, right bronchus or lung: Secondary | ICD-10-CM | POA: Insufficient documentation

## 2021-11-13 DIAGNOSIS — C7951 Secondary malignant neoplasm of bone: Secondary | ICD-10-CM | POA: Diagnosis not present

## 2021-11-13 DIAGNOSIS — C3491 Malignant neoplasm of unspecified part of right bronchus or lung: Secondary | ICD-10-CM

## 2021-11-13 DIAGNOSIS — Z923 Personal history of irradiation: Secondary | ICD-10-CM | POA: Insufficient documentation

## 2021-11-13 DIAGNOSIS — E119 Type 2 diabetes mellitus without complications: Secondary | ICD-10-CM | POA: Diagnosis not present

## 2021-11-13 DIAGNOSIS — Z7984 Long term (current) use of oral hypoglycemic drugs: Secondary | ICD-10-CM | POA: Insufficient documentation

## 2021-11-13 DIAGNOSIS — Z79899 Other long term (current) drug therapy: Secondary | ICD-10-CM | POA: Insufficient documentation

## 2021-11-13 DIAGNOSIS — Z5111 Encounter for antineoplastic chemotherapy: Secondary | ICD-10-CM | POA: Diagnosis not present

## 2021-11-13 DIAGNOSIS — Z5112 Encounter for antineoplastic immunotherapy: Secondary | ICD-10-CM | POA: Insufficient documentation

## 2021-11-13 DIAGNOSIS — C3490 Malignant neoplasm of unspecified part of unspecified bronchus or lung: Secondary | ICD-10-CM | POA: Diagnosis not present

## 2021-11-13 DIAGNOSIS — C7931 Secondary malignant neoplasm of brain: Secondary | ICD-10-CM | POA: Insufficient documentation

## 2021-11-13 DIAGNOSIS — E538 Deficiency of other specified B group vitamins: Secondary | ICD-10-CM | POA: Diagnosis not present

## 2021-11-13 LAB — CMP (CANCER CENTER ONLY)
ALT: 37 U/L (ref 0–44)
AST: 25 U/L (ref 15–41)
Albumin: 3.9 g/dL (ref 3.5–5.0)
Alkaline Phosphatase: 72 U/L (ref 38–126)
Anion gap: 9 (ref 5–15)
BUN: 10 mg/dL (ref 8–23)
CO2: 28 mmol/L (ref 22–32)
Calcium: 9.3 mg/dL (ref 8.9–10.3)
Chloride: 102 mmol/L (ref 98–111)
Creatinine: 0.89 mg/dL (ref 0.61–1.24)
GFR, Estimated: >60 mL/min (ref >60)
Glucose, Bld: 210 mg/dL — ABNORMAL HIGH (ref 70–99)
Potassium: 3.7 mmol/L (ref 3.5–5.1)
Sodium: 139 mmol/L (ref 135–145)
Total Bilirubin: 0.3 mg/dL (ref 0.3–1.2)
Total Protein: 6.6 g/dL (ref 6.5–8.1)

## 2021-11-13 LAB — TSH: TSH: 1.076 u[IU]/mL (ref 0.320–4.118)

## 2021-11-13 LAB — CBC WITH DIFFERENTIAL (CANCER CENTER ONLY)
Abs Immature Granulocytes: 0.05 10*3/uL (ref 0.00–0.07)
Basophils Absolute: 0.1 10*3/uL (ref 0.0–0.1)
Basophils Relative: 1 %
Eosinophils Absolute: 0.1 10*3/uL (ref 0.0–0.5)
Eosinophils Relative: 1 %
HCT: 43 % (ref 39.0–52.0)
Hemoglobin: 13.6 g/dL (ref 13.0–17.0)
Immature Granulocytes: 1 %
Lymphocytes Relative: 13 %
Lymphs Abs: 1.2 10*3/uL (ref 0.7–4.0)
MCH: 30.4 pg (ref 26.0–34.0)
MCHC: 31.6 g/dL (ref 30.0–36.0)
MCV: 96.2 fL (ref 80.0–100.0)
Monocytes Absolute: 0.4 10*3/uL (ref 0.1–1.0)
Monocytes Relative: 5 %
Neutro Abs: 7.6 10*3/uL (ref 1.7–7.7)
Neutrophils Relative %: 79 %
Platelet Count: 249 10*3/uL (ref 150–400)
RBC: 4.47 MIL/uL (ref 4.22–5.81)
RDW: 15.9 % — ABNORMAL HIGH (ref 11.5–15.5)
WBC Count: 9.4 10*3/uL (ref 4.0–10.5)
nRBC: 0 % (ref 0.0–0.2)

## 2021-11-13 MED ORDER — SODIUM CHLORIDE 0.9 % IV SOLN
200.0000 mg | Freq: Once | INTRAVENOUS | Status: AC
  Administered 2021-11-13: 200 mg via INTRAVENOUS
  Filled 2021-11-13: qty 200

## 2021-11-13 MED ORDER — PROCHLORPERAZINE MALEATE 10 MG PO TABS
10.0000 mg | ORAL_TABLET | Freq: Once | ORAL | Status: AC
  Administered 2021-11-13: 10 mg via ORAL
  Filled 2021-11-13: qty 1

## 2021-11-13 MED ORDER — SODIUM CHLORIDE 0.9 % IV SOLN
500.0000 mg/m2 | Freq: Once | INTRAVENOUS | Status: AC
  Administered 2021-11-13: 1000 mg via INTRAVENOUS
  Filled 2021-11-13: qty 40

## 2021-11-13 MED ORDER — SODIUM CHLORIDE 0.9 % IV SOLN: Freq: Once | INTRAVENOUS | Status: AC

## 2021-11-13 NOTE — Patient Instructions (Signed)
[facility]  Discharge Instructions: Thank you for choosing [facility] to provide your oncology and hematology care.   If you have a lab appointment with the Cancer Center, please go directly to the Cancer Center and check in at the registration area.   Wear comfortable clothing and clothing appropriate for easy access to any Portacath or PICC line.   We strive to give you quality time with your provider. You may need to reschedule your appointment if you arrive late (15 or more minutes).  Arriving late affects you and other patients whose appointments are after yours.  Also, if you miss three or more appointments without notifying the office, you may be dismissed from the clinic at the provider's discretion.      For prescription refill requests, have your pharmacy contact our office and allow 72 hours for refills to be completed.    Today you received the following chemotherapy and/or immunotherapy agents Pembrolizumab (Keytruda) and Pemetrexed (Alimta)      To help prevent nausea and vomiting after your treatment, we encourage you to take your nausea medication as directed.  BELOW ARE SYMPTOMS THAT SHOULD BE REPORTED IMMEDIATELY: *FEVER GREATER THAN 100.4 F (38 C) OR HIGHER *CHILLS OR SWEATING *NAUSEA AND VOMITING THAT IS NOT CONTROLLED WITH YOUR NAUSEA MEDICATION *UNUSUAL SHORTNESS OF BREATH *UNUSUAL BRUISING OR BLEEDING *URINARY PROBLEMS (pain or burning when urinating, or frequent urination) *BOWEL PROBLEMS (unusual diarrhea, constipation, pain near the anus) TENDERNESS IN MOUTH AND THROAT WITH OR WITHOUT PRESENCE OF ULCERS (sore throat, sores in mouth, or a toothache) UNUSUAL RASH, SWELLING OR PAIN  UNUSUAL VAGINAL DISCHARGE OR ITCHING   Items with * indicate a potential emergency and should be followed up as soon as possible or go to the Emergency Department if any problems should occur.  Please show the CHEMOTHERAPY ALERT CARD or  IMMUNOTHERAPY ALERT CARD at check-in to the Emergency Department and triage nurse.  Should you have questions after your visit or need to cancel or reschedule your appointment, please contact [facility]  Dept: [phone]  and follow the prompts.  Office hours are 8:00 a.m. to 4:30 p.m. Monday - Friday. Please note that voicemails left after 4:00 p.m. may not be returned until the following business day.  We are closed weekends and major holidays. You have access to a nurse at all times for urgent questions. Please call the main number to the clinic Dept: [phone] and follow the prompts.   For any non-urgent questions, you may also contact your provider using MyChart. We now offer e-Visits for anyone 18 and older to request care online for non-urgent symptoms. For details visit [email or web address].   Also download the MyChart app! Go to the app store, search "MyChart", open the app, select [facility], and log in with your MyChart username and password.  Due to Covid, a mask is required upon entering the hospital/clinic. If you do not have a mask, one will be given to you upon arrival. For doctor visits, patients may have 1 support person aged 18 or older with them. For treatment visits, patients cannot have anyone with them due to current Covid guidelines and our immunocompromised population.   

## 2021-11-13 NOTE — Progress Notes (Signed)
[facility] Telephone:([phone]   Fax:([phone]  OFFICE PROGRESS NOTE  Bradley Goodman, [place] [address]  DIAGNOSIS: Stage IV (T1b, N3, M1C) non-small cell lung cancer, favoring adenocarcinoma presented with right upper lobe lung nodule in addition to right hilar, subcarinal and bilateral mediastinal as well as supraclavicular lymphadenopathy in addition to bone and brain metastasis diagnosed in June 2022.     PD-L1 expression 80%.     Molecular Studies:  Biomarker Findings Microsatellite status - MS-Stable Tumor Mutational Burden - 6 Muts/Mb Genomic Findings For a complete list of the genes assayed, please refer to the Appendix. KRAS G12C, amplification ATM S470* CCND1 amplification - equivocal HGF amplification - equivocal MYC amplification - equivocal FGF19 amplification - equivocal FGF3 amplification - equivocal FGF4 amplification - equivocal NFKBIA amplification NKX2-1 amplification RAD21 amplification - equivocal [id number]*26 TERT promoter -124C>T TP53 rearrangement exon 9 7 Disease relevant genes with no reportable alterations: ALK, BRAF, EGFR, ERBB2, MET, RET, ROS1   PRIOR THERAPY: SRS to the metastatic brain lesions under the care of Dr. MTammi Klippel  Last treatment on 05/04/2021.   CURRENT THERAPY: Palliative systemic chemotherapy with carboplatin for an AUC 5, Alimta 500 mg/m2 and, Keytruda 200 mg IV every 3 weeks.  First dose expected on 05/08/2021.  Status post 9 cycles.  Starting from cycle #5 he is on maintenance treatment with Alimta and Keytruda every 3 weeks.  INTERVAL HISTORY: Bradley Goodman 66y.o. male returns to the clinic today for follow-up visit accompanied by his wife.  The patient is feeling fine today with no concerning complaints.  He denied having any current chest pain, shortness of breath, cough or hemoptysis.  He has no nausea, vomiting, diarrhea or constipation.  He has no  headache or visual changes.  He denied having any significant weight loss or night sweats.  He continues to tolerate his treatment with maintenance Alimta and Keytruda fairly well.  He started treatment with metformin recently for his diabetes.  The patient is here today for evaluation before starting cycle #10 of his treatment.  MEDICAL HISTORY: Past Medical History:  Diagnosis Date   Cervical spondylolysis    Essential hypertension    GERD (gastroesophageal reflux disease)    History of kidney stones    History of migraine    Hyperlipidemia    Hypertension    PONV (postoperative nausea and vomiting)    Type 2 diabetes mellitus (HCC)     ALLERGIES:  has No Known Allergies.  MEDICATIONS:  Current Outpatient Medications  Medication Sig Dispense Refill   cholecalciferol (VITAMIN D) 1000 UNITS tablet Take 1,000 Units by mouth every evening.     Continuous Blood Gluc Sensor (FREESTYLE LIBRE 3 SENSOR) MISC Place 1 sensor on the skin every 14 days. Use to check glucose continuously 2 each 2   dexamethasone (DECADRON) 1 MG tablet Take 2 tablets by mouth daily. 180 tablet 1   docusate sodium (COLACE) 100 MG capsule Take 100 mg by mouth daily.     folic acid (FOLVITE) 1 MG tablet Take 1 tablet by mouth daily. 30 tablet 4   Krill Oil 300 MG CAPS Take 300 mg by mouth every evening.     levothyroxine (SYNTHROID) 25 MCG tablet Take 1 tablet (25 mcg total) by mouth daily before breakfast. 90 tablet 1   loratadine (CLARITIN) 10 MG tablet Take 10 mg by mouth every evening.      losartan (COZAAR) 50 MG  tablet Take 1 and 1/2 tablets (75 mg total) by mouth daily. 135 tablet 1   magic mouthwash (nystatin, diphenhydrAMINE, alum & mag hydroxide) suspension mixture Swish and spit 5 mls up to 4 times daily as needed. 240 mL 1   metFORMIN (GLUCOPHAGE XR) 500 MG 24 hr tablet Take 1 tablet by mouth 2 times daily with a meal. 60 tablet 2   mirtazapine (REMERON) 15 MG tablet Take 1 tablet (15 mg total) by mouth  at bedtime. 90 tablet 1   Multiple Vitamins-Minerals (MULTIVITAMIN GUMMIES ADULT PO) Take 2 each by mouth daily.     pantoprazole (PROTONIX) 40 MG tablet Take 1 tablet by mouth every evening. 30 tablet 5   prochlorperazine (COMPAZINE) 10 MG tablet Take 1 tablet (10 mg total) by mouth every 6 (six) hours as needed for nausea or vomiting. 30 tablet 0   rosuvastatin (CRESTOR) 20 MG tablet Take 1 tablet (20 mg total) by mouth daily. 90 tablet 1   SYRINGE-NEEDLE, DISP, 3 ML 21G X 1-1/2" 3 ML MISC Use to inject testosterone every week 100 each 2   testosterone cypionate (DEPO-TESTOSTERONE) 100 MG/ML injection Inject 0.5 mLs (50 mg total) into the muscle every 7 (seven) days. For IM use only 10 mL 0   venlafaxine XR (EFFEXOR-XR) 75 MG 24 hr capsule Take 1 capsule (75 mg total) by mouth daily with breakfast. 90 capsule 1   No current facility-administered medications for this visit.    SURGICAL HISTORY:  Past Surgical History:  Procedure Laterality Date   Bilateral inguinal hernia repair     BRONCHIAL NEEDLE ASPIRATION BIOPSY  04/05/2021   Procedure: BRONCHIAL NEEDLE ASPIRATION BIOPSIES;  Surgeon: Garner Nash, DO;  Location: [facility];  Service: Pulmonary;;   COLONOSCOPY  01/23/2012   Procedure: COLONOSCOPY;  Surgeon: Daneil Dolin, MD;  [facility];  Service: Endoscopy;  Laterality: N/A;  9:30 AM   COLONOSCOPY N/A 10/11/2015   Procedure: COLONOSCOPY;  Surgeon: Daneil Dolin, MD;  [facility];  Service: Endoscopy;  Laterality: N/A;  830   COLONOSCOPY N/A 10/14/2019   Procedure: COLONOSCOPY;  Surgeon: Daneil Dolin, MD;  [facility];  Service: Endoscopy;  Laterality: N/A;  1:45   POLYPECTOMY  10/14/2019   Procedure: POLYPECTOMY;  Surgeon: Daneil Dolin, MD;  [facility];  Service: Endoscopy;;  ascending colon, descending colon   VIDEO BRONCHOSCOPY WITH ENDOBRONCHIAL ULTRASOUND N/A 04/05/2021   Procedure: VIDEO BRONCHOSCOPY WITH ENDOBRONCHIAL  ULTRASOUND;  Surgeon: Garner Nash, DO;  Location: [facility];  Service: Pulmonary;  Laterality: N/A;    REVIEW OF SYSTEMS:  A comprehensive review of systems was negative.   PHYSICAL EXAMINATION: General appearance: alert, cooperative, appears stated age, fatigued, and no distress Head: Normocephalic, without obvious abnormality, atraumatic Neck: no adenopathy, no JVD, supple, symmetrical, trachea midline, and thyroid not enlarged, symmetric, no tenderness/mass/nodules Lymph nodes: Cervical, supraclavicular, and axillary nodes normal. Resp: clear to auscultation bilaterally Back: symmetric, no curvature. ROM normal. No CVA tenderness. Cardio: regular rate and rhythm, S1, S2 normal, no murmur, click, rub or gallop GI: soft, non-tender; bowel sounds normal; no masses,  no organomegaly Extremities: extremities normal, atraumatic, no cyanosis or edema  ECOG PERFORMANCE STATUS: 1 - Symptomatic but completely ambulatory  Blood pressure 127/74, pulse (!) 103, temperature (!) 97.2 F (36.2 C), temperature source Tympanic, resp. rate 19, height _0  (1.854 m), weight 177 lb 8 oz (80.5 kg), SpO2 100 %.  LABORATORY DATA: Lab Results  Component Value Date   WBC 9.4 11/13/2021   HGB 13.6 11/13/2021   HCT 43.0 11/13/2021   MCV 96.2 11/13/2021   PLT 249 11/13/2021      Chemistry      Component Value Date/Time   NA 138 10/23/2021 1007   NA 144 01/10/2021 0849   K 4.0 10/23/2021 1007   CL 102 10/23/2021 1007   CO2 27 10/23/2021 1007   BUN 8 10/23/2021 1007   BUN 15 01/10/2021 0849   CREATININE 0.91 10/23/2021 1007      Component Value Date/Time   CALCIUM 9.2 10/23/2021 1007   ALKPHOS 97 10/23/2021 1007   AST 33 10/23/2021 1007   ALT 43 10/23/2021 1007   BILITOT 0.4 10/23/2021 1007       RADIOGRAPHIC STUDIES: MR BRAIN W WO CONTRAST  Result Date: 11/10/2021 CLINICAL DATA:  Brain/CNS neoplasm, assess treatment response; metastatic lung cancer EXAM: MRI HEAD WITHOUT AND WITH  CONTRAST TECHNIQUE: Multiplanar, multiecho pulse sequences of the brain and surrounding structures were obtained without and with intravenous contrast. CONTRAST:  67m MULTIHANCE GADOBENATE DIMEGLUMINE 529 MG/ML IV SOLN COMPARISON:  August 09, 2021 FINDINGS: Brain: Previously described right parieto-occipital enhancing lesion is likely further decreased in size from 2 mm to 1 mm (series 11, image 93). Stable 2 mm left cerebellar enhancing lesion (series 11, image 54). No other abnormal enhancement is identified. There is no acute infarction or intracranial hemorrhage. There is no hydrocephalus or extra-axial fluid collection. Ventricles and sulci are stable in size and configuration. Patchy and confluent areas of T2 hyperintensity in the supratentorial and pontine white matter are nonspecific but probably reflect similar chronic microvascular ischemic changes. Vascular: Major vessel flow voids at the skull base are preserved. Skull and upper cervical spine: Normal marrow signal is preserved. Sinuses/Orbits: Paranasal sinuses are aerated. Orbits are unremarkable. Other: Further improvement in appearance of the pituitary stalk, which no longer appears thickened on these large field-of-view images. Mastoid air cells are clear. IMPRESSION: Decrease in size of now punctate right parieto-occipital lesion. Stable small left cerebellar lesion. Further improvement in appearance of the pituitary stalk. No evidence of new metastatic disease. Electronically Signed   By: PMacy MisM.D.   On: 11/10/2021 16:45    ASSESSMENT AND PLAN: This is a very pleasant 66years old white male recently diagnosed with stage IV (T1b, N3, M1 C) non-small cell lung cancer favoring adenocarcinoma presented with right upper lobe lung nodule in addition to right hilar, subcarinal and bilateral mediastinal as well as supraclavicular lymphadenopathy.  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  His PD-L1 expression  is 80% and his molecular studies showed KRAS G12C mutation. The patient underwent SRS to metastatic brain lesion under the care of Dr. MTammi Klippeland he is currently undergoing systemic chemotherapy with carboplatin for AUC of 5, Alimta 500 Mg/M2 and Keytruda 200 Mg IV every 3 weeks status post 9 cycles.  Starting from cycle #5 the patient will be treated with maintenance treatment with Alimta and Keytruda every 3 weeks. The patient continues to tolerate his treatment fairly well with no concerning adverse effects. I recommended for him to proceed with cycle #10 today as planned. I will see him back for follow-up visit in 3 weeks for evaluation with repeat CT scan of the chest, abdomen and pelvis for restaging of his disease. The patient was advised to call immediately if he has any other concerning symptoms in the interval. The patient voices understanding  of current disease status and treatment options and is in agreement with the current care plan.  All questions were answered. The patient knows to call the clinic with any problems, questions or concerns. We can certainly see the patient much sooner if necessary.  Disclaimer: This note was dictated with voice recognition software. Similar sounding words can inadvertently be transcribed and may not be corrected upon review.

## 2021-11-14 ENCOUNTER — Inpatient Hospital Stay: Payer: 59 | Admitting: Internal Medicine

## 2021-11-14 ENCOUNTER — Ambulatory Visit: Payer: 59 | Admitting: Internal Medicine

## 2021-11-14 VITALS — BP 129/91 | HR 105 | Temp 97.9°F | Resp 19 | Ht 73.0 in | Wt 180.1 lb

## 2021-11-14 DIAGNOSIS — C3411 Malignant neoplasm of upper lobe, right bronchus or lung: Secondary | ICD-10-CM | POA: Diagnosis not present

## 2021-11-14 DIAGNOSIS — Z79899 Other long term (current) drug therapy: Secondary | ICD-10-CM | POA: Diagnosis not present

## 2021-11-14 DIAGNOSIS — Z5112 Encounter for antineoplastic immunotherapy: Secondary | ICD-10-CM | POA: Diagnosis not present

## 2021-11-14 DIAGNOSIS — E119 Type 2 diabetes mellitus without complications: Secondary | ICD-10-CM | POA: Diagnosis not present

## 2021-11-14 DIAGNOSIS — C7931 Secondary malignant neoplasm of brain: Secondary | ICD-10-CM

## 2021-11-14 DIAGNOSIS — E538 Deficiency of other specified B group vitamins: Secondary | ICD-10-CM | POA: Diagnosis not present

## 2021-11-14 DIAGNOSIS — Z5111 Encounter for antineoplastic chemotherapy: Secondary | ICD-10-CM | POA: Diagnosis not present

## 2021-11-14 DIAGNOSIS — C7951 Secondary malignant neoplasm of bone: Secondary | ICD-10-CM | POA: Diagnosis not present

## 2021-11-14 DIAGNOSIS — Z7984 Long term (current) use of oral hypoglycemic drugs: Secondary | ICD-10-CM | POA: Diagnosis not present

## 2021-11-14 NOTE — Progress Notes (Signed)
[facility] [place], [place] [address] [phone]   Interval Evaluation  Date of Service: 11/14/21 Patient Name: JAMILLE YOSHINO Patient MRN: [id number] Patient DOB: Oct 09, 1955 Provider: Ventura Sellers, MD  Identifying Statement:  JAMERE STIDHAM is a 66 y.o. male with Brain metastasis ([facility]) - Plan: MR BRAIN W WO CONTRAST   Primary Cancer:  Oncologic History: Oncology History  Adenocarcinoma of right lung, stage 4 ([place])  04/25/2021 Initial Diagnosis   Adenocarcinoma of right lung, stage 4 ([place])   04/25/2021 Cancer Staging   Staging form: Lung, AJCC 8th Edition - Clinical: Stage IVB (cT1b, cN3, cM1c) - Signed by Curt Bears, MD on 04/25/2021    05/08/2021 -  Chemotherapy   Patient is on Treatment Plan : LUNG CARBOplatin / Pemetrexed / Pembrolizumab q21d Induction x 4 cycles / Maintenance Pemetrexed + Pembrolizumab      CNS Oncologic History 05/04/21: Radiosurgery to 4 brain metastases, 5 fractions to pituitary lesion  Interval History: TARANCE BALAN presents today for follow up after recent MRI brain.  No new or progressive changes reported.  No issues with visual impairment as prior.  Denies seizures and headaches.  H+P (05/23/21) Patient presented to medical attention initially on 03/27/21 with new onset left eyelid drooping.  CNS imaging was obtained, which demonstrated multiple enhancing lesions consistent with brain metastases.  Systemic workup demonstrated lung cancer, which was confirmed through endobronchial biopsy.  He underwent single fraction radiosurgery to brain lesions, and fractionated radiosurgery to pituitary lesion.  Following radiation, eyelid drooping improved, but he developed "tunnel vision" visual deficits, described as "like looking through plastic".  This has been more or less static over the past week or so, though it did improve transiently after dosing decadron with chemotherapy on 05/08/21.    Medications: Current Outpatient Medications on File Prior to Visit  Medication Sig Dispense Refill   cholecalciferol (VITAMIN D) 1000 UNITS tablet Take 1,000 Units by mouth every evening.     Continuous Blood Gluc Sensor (FREESTYLE LIBRE 3 SENSOR) MISC Place 1 sensor on the skin every 14 days. Use to check glucose continuously 2 each 2   dexamethasone (DECADRON) 1 MG tablet Take 2 tablets by mouth daily. 180 tablet 1   docusate sodium (COLACE) 100 MG capsule Take 100 mg by mouth daily.     folic acid (FOLVITE) 1 MG tablet Take 1 tablet by mouth daily. 30 tablet 4   Krill Oil 300 MG CAPS Take 300 mg by mouth every evening.     levothyroxine (SYNTHROID) 25 MCG tablet Take 1 tablet (25 mcg total) by mouth daily before breakfast. 90 tablet 1   loratadine (CLARITIN) 10 MG tablet Take 10 mg by mouth every evening.      losartan (COZAAR) 50 MG tablet Take 1 and 1/2 tablets (75 mg total) by mouth daily. 135 tablet 1   magic mouthwash (nystatin, diphenhydrAMINE, alum & mag hydroxide) suspension mixture Swish and spit 5 mls up to 4 times daily as needed. 240 mL 1   metFORMIN (GLUCOPHAGE XR) 500 MG 24 hr tablet Take 1 tablet by mouth 2 times daily with a meal. 60 tablet 2   mirtazapine (REMERON) 15 MG tablet Take 1 tablet (15 mg total) by mouth at bedtime. 90 tablet 1   Multiple Vitamins-Minerals (MULTIVITAMIN GUMMIES ADULT PO) Take 2 each by mouth daily.     pantoprazole (PROTONIX) 40 MG tablet Take 1 tablet by mouth every evening. [place]  tablet 5   prochlorperazine (COMPAZINE) 10 MG tablet Take 1 tablet (10 mg total) by mouth every 6 (six) hours as needed for nausea or vomiting. 30 tablet 0   rosuvastatin (CRESTOR) 20 MG tablet Take 1 tablet (20 mg total) by mouth daily. 90 tablet 1   SYRINGE-NEEDLE, DISP, 3 ML 21G X 1-1/2" 3 ML MISC Use to inject testosterone every week 100 each 2   testosterone cypionate (DEPO-TESTOSTERONE) 100 MG/ML injection Inject 0.5 mLs (50 mg total) into the muscle every 7 (seven)  days. For IM use only 10 mL 0   venlafaxine XR (EFFEXOR-XR) 75 MG 24 hr capsule Take 1 capsule (75 mg total) by mouth daily with breakfast. 90 capsule 1   No current facility-administered medications on file prior to visit.    Allergies: No Known Allergies Past Medical History:  Past Medical History:  Diagnosis Date   Cervical spondylolysis    Essential hypertension    GERD (gastroesophageal reflux disease)    History of kidney stones    History of migraine    Hyperlipidemia    Hypertension    PONV (postoperative nausea and vomiting)    Type 2 diabetes mellitus ([place])    Past Surgical History:  Past Surgical History:  Procedure Laterality Date   Bilateral inguinal hernia repair     BRONCHIAL NEEDLE ASPIRATION BIOPSY  04/05/2021   Procedure: BRONCHIAL NEEDLE ASPIRATION BIOPSIES;  Surgeon: Garner Nash, DO;  Location: [facility];  Service: Pulmonary;;   COLONOSCOPY  01/23/2012   Procedure: COLONOSCOPY;  Surgeon: Daneil Dolin, MD;  [facility];  Service: Endoscopy;  Laterality: N/A;  9:30 AM   COLONOSCOPY N/A 10/11/2015   Procedure: COLONOSCOPY;  Surgeon: Daneil Dolin, MD;  [facility];  Service: Endoscopy;  Laterality: N/A;  830   COLONOSCOPY N/A 10/14/2019   Procedure: COLONOSCOPY;  Surgeon: Daneil Dolin, MD;  [facility];  Service: Endoscopy;  Laterality: N/A;  1:45   POLYPECTOMY  10/14/2019   Procedure: POLYPECTOMY;  Surgeon: Daneil Dolin, MD;  [facility];  Service: Endoscopy;;  ascending colon, descending colon   VIDEO BRONCHOSCOPY WITH ENDOBRONCHIAL ULTRASOUND N/A 04/05/2021   Procedure: VIDEO BRONCHOSCOPY WITH ENDOBRONCHIAL ULTRASOUND;  Surgeon: Garner Nash, DO;  Location: [facility];  Service: Pulmonary;  Laterality: N/A;   Social History:  Social History   Socioeconomic History   Marital status: Married    Spouse name: Not on file   Number of children: Not on file   Years of education: Not on file    Highest education level: Not on file  Occupational History   Not on file  Tobacco Use   Smoking status: Every Day    Packs/day: 0.50    Years: 30.00    Pack years: 15.00    Types: Cigarettes    Start date: 10/30/1973   Smokeless tobacco: Never  Vaping Use   Vaping Use: Never used  Substance and Sexual Activity   Alcohol use: Yes    Alcohol/week: 0.0 standard drinks    Comment: One drink every 6 months.   Drug use: No   Sexual activity: Yes  Other Topics Concern   Not on file  Social History Narrative   Not on file   Social Determinants of Health   Financial Resource Strain: Not on file  Food Insecurity: Not on file  Transportation Needs: Not on file  Physical Activity: Not on file  Stress: Not on file  Social  Connections: Not on file  Intimate Partner Violence: Not on file   Family History:  Family History  Problem Relation Age of Onset   Hypertension Mother    Diabetes Mother    Heart attack Mother    Hypertension Father    Heart attack Father    Heart attack Brother    Colon cancer Neg Hx     Review of Systems: Constitutional: Doesn't report fevers, chills or abnormal weight loss Eyes: Doesn't report blurriness of vision Ears, nose, mouth, throat, and face: Doesn't report sore throat Respiratory: Doesn't report cough, dyspnea or wheezes Cardiovascular: Doesn't report palpitation, chest discomfort  Gastrointestinal:  Doesn't report nausea, constipation, diarrhea GU: Doesn't report incontinence Skin: Doesn't report skin rashes Neurological: Per HPI Musculoskeletal: Doesn't report joint pain Behavioral/Psych: Doesn't report anxiety  Physical Exam: Vitals:   11/14/21 1222  BP: (!) 129/91  Pulse: (!) 105  Resp: 19  Temp: 97.9 F (36.6 C)  SpO2: 98%   KPS: 90. General: Alert, cooperative, pleasant, in no acute distress Head: Normal EENT: No conjunctival injection or scleral icterus.  Lungs: Resp effort normal Cardiac: Regular rate Abdomen:  Non-distended abdomen Skin: No rashes cyanosis or petechiae. Extremities: No clubbing or edema  Neurologic Exam: Mental Status: Awake, alert, attentive to examiner. Oriented to self and environment. Language is fluent with intact comprehension.  Cranial Nerves: Visual acuity is grossly normal. Visual fields are full. Extra-ocular movements intact. No ptosis. Face is symmetric Motor: Tone and bulk are normal. Power is full in both arms and legs. Reflexes are symmetric, no pathologic reflexes present.  Sensory: Intact to light touch Gait: Normal.   Labs: I have reviewed the data as listed    Component Value Date/Time   NA 139 11/13/2021 1004   NA 144 01/10/2021 0849   K 3.7 11/13/2021 1004   CL 102 11/13/2021 1004   CO2 28 11/13/2021 1004   GLUCOSE 210 (H) 11/13/2021 1004   BUN 10 11/13/2021 1004   BUN 15 01/10/2021 0849   CREATININE 0.89 11/13/2021 1004   CALCIUM 9.3 11/13/2021 1004   PROT 6.6 11/13/2021 1004   PROT 6.3 01/10/2021 0849   ALBUMIN 3.9 11/13/2021 1004   ALBUMIN 4.2 01/10/2021 0849   AST 25 11/13/2021 1004   ALT 37 11/13/2021 1004   ALKPHOS 72 11/13/2021 1004   BILITOT 0.3 11/13/2021 1004   GFRNONAA >60 11/13/2021 1004   GFRAA 82 07/14/2020 0945   Lab Results  Component Value Date   WBC 9.4 11/13/2021   NEUTROABS 7.6 11/13/2021   HGB 13.6 11/13/2021   HCT 43.0 11/13/2021   MCV 96.2 11/13/2021   PLT 249 11/13/2021   Imaging:  Stem Clinician Interpretation: I have personally reviewed the CNS images as listed.  My interpretation, in the context of the patient's clinical presentation, is stable disease  MR BRAIN W WO CONTRAST  Result Date: 11/10/2021 CLINICAL DATA:  Brain/CNS neoplasm, assess treatment response; metastatic lung cancer EXAM: MRI HEAD WITHOUT AND WITH CONTRAST TECHNIQUE: Multiplanar, multiecho pulse sequences of the brain and surrounding structures were obtained without and with intravenous contrast. CONTRAST:  26mL MULTIHANCE GADOBENATE  DIMEGLUMINE 529 MG/ML IV SOLN COMPARISON:  August 09, 2021 FINDINGS: Brain: Previously described right parieto-occipital enhancing lesion is likely further decreased in size from 2 mm to 1 mm (series 11, image 93). Stable 2 mm left cerebellar enhancing lesion (series 11, image 54). No other abnormal enhancement is identified. There is no acute infarction or intracranial hemorrhage. There is no hydrocephalus or  extra-axial fluid collection. Ventricles and sulci are stable in size and configuration. Patchy and confluent areas of T2 hyperintensity in the supratentorial and pontine white matter are nonspecific but probably reflect similar chronic microvascular ischemic changes. Vascular: Major vessel flow voids at the skull base are preserved. Skull and upper cervical spine: Normal marrow signal is preserved. Sinuses/Orbits: Paranasal sinuses are aerated. Orbits are unremarkable. Other: Further improvement in appearance of the pituitary stalk, which no longer appears thickened on these large field-of-view images. Mastoid air cells are clear. IMPRESSION: Decrease in size of now punctate right parieto-occipital lesion. Stable small left cerebellar lesion. Further improvement in appearance of the pituitary stalk. No evidence of new metastatic disease. Electronically Signed   By: Macy Mis M.D.   On: 11/10/2021 16:45     Assessment/Plan Brain metastasis ([facility]) - Plan: MR BRAIN W WO CONTRAST  Naszir D Glendinning is clinically and radiographically stable today.  For pan-hypopit, will continue to follow with Dr. Dorris Fetch.    He will con't to undergo chemotherapy with Dr. Julien Nordmann.  We appreciate the opportunity to participate in the care of SPERO GUNNELS.   We ask that MISAEL MCGAHA return to clinic in 3 months following next brain MRI, or sooner as needed.  All questions were answered. The patient knows to call the clinic with any problems, questions or concerns. No barriers to learning were detected.  The  total time spent in the encounter was 30 minutes and more than 50% was on counseling and review of test results   Ventura Sellers, MD Medical Director of Neuro-Oncology [facility] 11/14/21 1:10 PM

## 2021-11-15 ENCOUNTER — Other Ambulatory Visit (HOSPITAL_COMMUNITY): Payer: Self-pay

## 2021-11-15 ENCOUNTER — Telehealth: Payer: Self-pay | Admitting: Internal Medicine

## 2021-11-15 DIAGNOSIS — H02834 Dermatochalasis of left upper eyelid: Secondary | ICD-10-CM | POA: Diagnosis not present

## 2021-11-15 DIAGNOSIS — H52223 Regular astigmatism, bilateral: Secondary | ICD-10-CM | POA: Diagnosis not present

## 2021-11-15 DIAGNOSIS — E119 Type 2 diabetes mellitus without complications: Secondary | ICD-10-CM | POA: Diagnosis not present

## 2021-11-15 DIAGNOSIS — H5203 Hypermetropia, bilateral: Secondary | ICD-10-CM | POA: Diagnosis not present

## 2021-11-15 DIAGNOSIS — H524 Presbyopia: Secondary | ICD-10-CM | POA: Diagnosis not present

## 2021-11-15 DIAGNOSIS — H2513 Age-related nuclear cataract, bilateral: Secondary | ICD-10-CM | POA: Diagnosis not present

## 2021-11-15 DIAGNOSIS — H40023 Open angle with borderline findings, high risk, bilateral: Secondary | ICD-10-CM | POA: Diagnosis not present

## 2021-11-15 DIAGNOSIS — H02831 Dermatochalasis of right upper eyelid: Secondary | ICD-10-CM | POA: Diagnosis not present

## 2021-11-15 LAB — HM DIABETES EYE EXAM

## 2021-11-15 NOTE — Telephone Encounter (Signed)
Scheduled per 2/7 los, message has been left with pt

## 2021-11-16 ENCOUNTER — Other Ambulatory Visit (HOSPITAL_COMMUNITY): Payer: Self-pay

## 2021-11-16 ENCOUNTER — Encounter: Payer: Self-pay | Admitting: Family Medicine

## 2021-11-16 ENCOUNTER — Ambulatory Visit: Payer: 59 | Admitting: Family Medicine

## 2021-11-16 DIAGNOSIS — E1165 Type 2 diabetes mellitus with hyperglycemia: Secondary | ICD-10-CM

## 2021-11-16 MED ORDER — METFORMIN HCL ER 500 MG PO TB24
1000.0000 mg | ORAL_TABLET | Freq: Two times a day (BID) | ORAL | 1 refills | Status: DC
  Filled 2021-11-16: qty 360, 90d supply, fill #0

## 2021-11-16 NOTE — Progress Notes (Signed)
Assessment & Plan:  1. Uncontrolled type 2 diabetes mellitus with hyperglycemia, without long-term current use of insulin (HCC) Lab Results  Component Value Date   HGBA1C 9.7 (H) 10/19/2021   HGBA1C 6.2 (H) 07/12/2021   HGBA1C 6.2 01/10/2021    - Diabetes is not at goal of A1c < 7. It is too soon to recheck his A1c, but his glucose levels are coming down. - Medications:  increase metformin from 500 mg BID to 1,000 mg BID with instructions to go up by one tablet x1 week, then increase to the full dosage. - Home glucose monitoring: continue using the freestyle libre - Patient is currently taking a statin. Patient is taking an ACE-inhibitor/ARB.  - Diabetic eye exam requested from [facility] in [place].  Diabetes Health Maintenance Due  Topic Date Due   FOOT EXAM  07/07/2020   OPHTHALMOLOGY EXAM  11/09/2021   HEMOGLOBIN A1C  04/18/2022    Lab Results  Component Value Date   LABMICR 7.3 10/19/2021   LABMICR 4.8 03/09/2020   MICROALBUR neg 07/13/2014   - metFORMIN (GLUCOPHAGE XR) 500 MG 24 hr tablet; Take 2 tablets by mouth 2  times daily with a meal.  Dispense: 360 tablet; Refill: 1   Return as scheduled.  Hendricks Limes, MSN, APRN, FNP-C [facility]  Subjective:    Patient ID: Bradley Goodman, male    DOB: July 12, 1956, 66 y.o.   MRN: [id number]  Patient Care Team: [name], FNP as PCP - General (Family Medicine) Gala Romney Cristopher Estimable, MD as Consulting Physician (Gastroenterology) Celestia Khat, [place] (Optometry) Valrie Hart, RN as Oncology Nurse Navigator (Oncology)   Chief Complaint:  Chief Complaint  Patient presents with   Diabetes    4 week follow up     HPI: Bradley Goodman is a 66 y.o. male presenting on 11/16/2021 for Diabetes (4 week follow up )  Diabetes: Patient presents for follow up of diabetes. Current symptoms include: hyperglycemia. Known diabetic complications: none. Medication compliance: yes - tolerating Metformin well. Current  diet: in general, a "healthy" diet  . States he is trying to learn what foods increase his glucose and the freestyle Elenor Legato is helping a lot with this. Current exercise: weightlifting. Home blood sugar records:  using the freestyle libre 3. He did have to take it off for a few days when he got a MRI recently . Is he  on ACE inhibitor or angiotensin II receptor blocker? Yes (Losartan). Is he on a statin? Yes (Rosuvastatin).    New complaints: None   Social history:  Relevant past medical, surgical, family and social history reviewed and updated as indicated. Interim medical history since our last visit reviewed.  Allergies and medications reviewed and updated.  DATA REVIEWED: CHART IN EPIC  ROS: Negative unless specifically indicated above in HPI.    Current Outpatient Medications:    cholecalciferol (VITAMIN D) 1000 UNITS tablet, Take 1,000 Units by mouth every evening., Disp: , Rfl:    Continuous Blood Gluc Sensor (FREESTYLE LIBRE 3 SENSOR) MISC, Place 1 sensor on the skin every 14 days. Use to check glucose continuously, Disp: 2 each, Rfl: 2   dexamethasone (DECADRON) 1 MG tablet, Take 2 tablets by mouth daily., Disp: 180 tablet, Rfl: 1   docusate sodium (COLACE) 100 MG capsule, Take 100 mg by mouth daily., Disp: , Rfl:    folic acid (FOLVITE) 1 MG tablet, Take 1 tablet by mouth daily., Disp: 30 tablet, Rfl: 4  Krill Oil 300 MG CAPS, Take 300 mg by mouth every evening., Disp: , Rfl:    levothyroxine (SYNTHROID) 25 MCG tablet, Take 1 tablet (25 mcg total) by mouth daily before breakfast., Disp: 90 tablet, Rfl: 1   loratadine (CLARITIN) 10 MG tablet, Take 10 mg by mouth every evening. , Disp: , Rfl:    losartan (COZAAR) 50 MG tablet, Take 1 and 1/2 tablets (75 mg total) by mouth daily., Disp: 135 tablet, Rfl: 1   magic mouthwash (nystatin, diphenhydrAMINE, alum & mag hydroxide) suspension mixture, Swish and spit 5 mls up to 4 times daily as needed., Disp: 240 mL, Rfl: 1   metFORMIN  (GLUCOPHAGE XR) 500 MG 24 hr tablet, Take 1 tablet by mouth 2 times daily with a meal., Disp: 60 tablet, Rfl: 2   mirtazapine (REMERON) 15 MG tablet, Take 1 tablet (15 mg total) by mouth at bedtime., Disp: 90 tablet, Rfl: 1   Multiple Vitamins-Minerals (MULTIVITAMIN GUMMIES ADULT PO), Take 2 each by mouth daily., Disp: , Rfl:    pantoprazole (PROTONIX) 40 MG tablet, Take 1 tablet by mouth every evening., Disp: 30 tablet, Rfl: 5   prochlorperazine (COMPAZINE) 10 MG tablet, Take 1 tablet (10 mg total) by mouth every 6 (six) hours as needed for nausea or vomiting., Disp: 30 tablet, Rfl: 0   rosuvastatin (CRESTOR) 20 MG tablet, Take 1 tablet (20 mg total) by mouth daily., Disp: 90 tablet, Rfl: 1   SYRINGE-NEEDLE, DISP, 3 ML 21G X 1-1/2" 3 ML MISC, Use to inject testosterone every week, Disp: 100 each, Rfl: 2   testosterone cypionate (DEPO-TESTOSTERONE) 100 MG/ML injection, Inject 0.5 mLs (50 mg total) into the muscle every 7 (seven) days. For IM use only, Disp: 10 mL, Rfl: 0   venlafaxine XR (EFFEXOR-XR) 75 MG 24 hr capsule, Take 1 capsule (75 mg total) by mouth daily with breakfast., Disp: 90 capsule, Rfl: 1   No Known Allergies Past Medical History:  Diagnosis Date   Cervical spondylolysis    Essential hypertension    GERD (gastroesophageal reflux disease)    History of kidney stones    History of migraine    Hyperlipidemia    Hypertension    PONV (postoperative nausea and vomiting)    Type 2 diabetes mellitus ([place])     Past Surgical History:  Procedure Laterality Date   Bilateral inguinal hernia repair     BRONCHIAL NEEDLE ASPIRATION BIOPSY  04/05/2021   Procedure: BRONCHIAL NEEDLE ASPIRATION BIOPSIES;  Surgeon: Garner Nash, DO;  Location: [facility];  Service: Pulmonary;;   COLONOSCOPY  01/23/2012   Procedure: COLONOSCOPY;  Surgeon: Daneil Dolin, MD;  [facility];  Service: Endoscopy;  Laterality: N/A;  9:30 AM   COLONOSCOPY N/A 10/11/2015   Procedure: COLONOSCOPY;   Surgeon: Daneil Dolin, MD;  [facility];  Service: Endoscopy;  Laterality: N/A;  830   COLONOSCOPY N/A 10/14/2019   Procedure: COLONOSCOPY;  Surgeon: Daneil Dolin, MD;  [facility];  Service: Endoscopy;  Laterality: N/A;  1:45   POLYPECTOMY  10/14/2019   Procedure: POLYPECTOMY;  Surgeon: Daneil Dolin, MD;  [facility];  Service: Endoscopy;;  ascending colon, descending colon   VIDEO BRONCHOSCOPY WITH ENDOBRONCHIAL ULTRASOUND N/A 04/05/2021   Procedure: VIDEO BRONCHOSCOPY WITH ENDOBRONCHIAL ULTRASOUND;  Surgeon: Garner Nash, DO;  Location: [facility];  Service: Pulmonary;  Laterality: N/A;    Social History   Socioeconomic History   Marital status: Married  Spouse name: Not on file   Number of children: Not on file   Years of education: Not on file   Highest education level: Not on file  Occupational History   Not on file  Tobacco Use   Smoking status: Every Day    Packs/day: 0.50    Years: 30.00    Pack years: 15.00    Types: Cigarettes    Start date: 10/30/1973   Smokeless tobacco: Never  Vaping Use   Vaping Use: Never used  Substance and Sexual Activity   Alcohol use: Yes    Alcohol/week: 0.0 standard drinks    Comment: One drink every 6 months.   Drug use: No   Sexual activity: Yes  Other Topics Concern   Not on file  Social History Narrative   Not on file   Social Determinants of Health   Financial Resource Strain: Not on file  Food Insecurity: Not on file  Transportation Needs: Not on file  Physical Activity: Not on file  Stress: Not on file  Social Connections: Not on file  Intimate Partner Violence: Not on file        Objective:    BP 130/87    Pulse (!) 111    Temp 97.9 F (36.6 C) (Temporal)    Ht _0  (1.854 m)    Wt 178 lb (80.7 kg)    SpO2 96%    BMI 23.48 kg/m   Wt Readings from Last 3 Encounters:  11/16/21 178 lb (80.7 kg)  11/14/21 180 lb 1.6 oz (81.7 kg)  11/13/21 177 lb 8 oz (80.5 kg)     Physical Exam Vitals reviewed.  Constitutional:      General: He is not in acute distress.    Appearance: Normal appearance. He is normal weight. He is not ill-appearing, toxic-appearing or diaphoretic.  HENT:     Head: Normocephalic and atraumatic.  Eyes:     General: No scleral icterus.       Right eye: No discharge.        Left eye: No discharge.     Conjunctiva/sclera: Conjunctivae normal.  Cardiovascular:     Rate and Rhythm: Normal rate and regular rhythm.     Heart sounds: Normal heart sounds. No murmur heard.   No friction rub. No gallop.  Pulmonary:     Effort: Pulmonary effort is normal. No respiratory distress.     Breath sounds: Normal breath sounds. No stridor. No wheezing, rhonchi or rales.  Musculoskeletal:        General: Normal range of motion.     Cervical back: Normal range of motion.  Skin:    General: Skin is warm and dry.  Neurological:     Mental Status: He is alert and oriented to person, place, and time. Mental status is at baseline.  Psychiatric:        Mood and Affect: Mood normal.        Behavior: Behavior normal.        Thought Content: Thought content normal.        Judgment: Judgment normal.    Lab Results  Component Value Date   TSH 1.076 11/13/2021   Lab Results  Component Value Date   WBC 9.4 11/13/2021   HGB 13.6 11/13/2021   HCT 43.0 11/13/2021   MCV 96.2 11/13/2021   PLT 249 11/13/2021   Lab Results  Component Value Date   NA 139 11/13/2021   K 3.7 11/13/2021   CO2 28 11/13/2021  GLUCOSE 210 (H) 11/13/2021   BUN 10 11/13/2021   CREATININE 0.89 11/13/2021   BILITOT 0.3 11/13/2021   ALKPHOS 72 11/13/2021   AST 25 11/13/2021   ALT 37 11/13/2021   PROT 6.6 11/13/2021   ALBUMIN 3.9 11/13/2021   CALCIUM 9.3 11/13/2021   ANIONGAP 9 11/13/2021   EGFR 76 01/10/2021   Lab Results  Component Value Date   CHOL 136 07/12/2021   Lab Results  Component Value Date   HDL 56 07/12/2021   Lab Results  Component Value  Date   LDLCALC 53 07/12/2021   Lab Results  Component Value Date   TRIG 161 (H) 07/12/2021   Lab Results  Component Value Date   CHOLHDL 2.4 07/12/2021   Lab Results  Component Value Date   HGBA1C 9.7 (H) 10/19/2021

## 2021-11-18 ENCOUNTER — Other Ambulatory Visit (HOSPITAL_COMMUNITY): Payer: Self-pay

## 2021-11-20 ENCOUNTER — Other Ambulatory Visit (HOSPITAL_COMMUNITY): Payer: Self-pay

## 2021-11-25 ENCOUNTER — Other Ambulatory Visit (HOSPITAL_COMMUNITY): Payer: Self-pay

## 2021-11-25 ENCOUNTER — Other Ambulatory Visit: Payer: Self-pay | Admitting: "Endocrinology

## 2021-11-27 ENCOUNTER — Other Ambulatory Visit: Payer: Self-pay

## 2021-11-27 ENCOUNTER — Ambulatory Visit (HOSPITAL_COMMUNITY)
Admission: RE | Admit: 2021-11-27 | Discharge: 2021-11-27 | Disposition: A | Discharge status: Home / Self Care | Payer: 59 | Source: Ambulatory Visit | Attending: Internal Medicine | Admitting: Internal Medicine

## 2021-11-27 ENCOUNTER — Encounter (HOSPITAL_COMMUNITY): Payer: Self-pay

## 2021-11-27 DIAGNOSIS — N200 Calculus of kidney: Secondary | ICD-10-CM | POA: Diagnosis not present

## 2021-11-27 DIAGNOSIS — R911 Solitary pulmonary nodule: Secondary | ICD-10-CM | POA: Diagnosis not present

## 2021-11-27 DIAGNOSIS — C3490 Malignant neoplasm of unspecified part of unspecified bronchus or lung: Secondary | ICD-10-CM | POA: Diagnosis not present

## 2021-11-27 DIAGNOSIS — I700 Atherosclerosis of aorta: Secondary | ICD-10-CM | POA: Diagnosis not present

## 2021-11-27 DIAGNOSIS — K760 Fatty (change of) liver, not elsewhere classified: Secondary | ICD-10-CM | POA: Diagnosis not present

## 2021-11-27 IMAGING — CT CT CHEST W/ CM
2 of 5 series · 12 of 36 positions shown, 15 images · IV contrast (OMNIPAQUE)
Comparison: CT on [DATE], and abdomen MRI on [DATE]

CLINICAL DATA: Primary Cancer Type: Lung
TECHNIQUE: Multidetector CT imaging of the chest, abdomen and pelvis was
performed following the standard protocol during bolus
administration of intravenous contrast.

[Series 4: coronals · coronal · 0.80mm/px · 3 of 163 slices shown]
[im 33/163  lung]
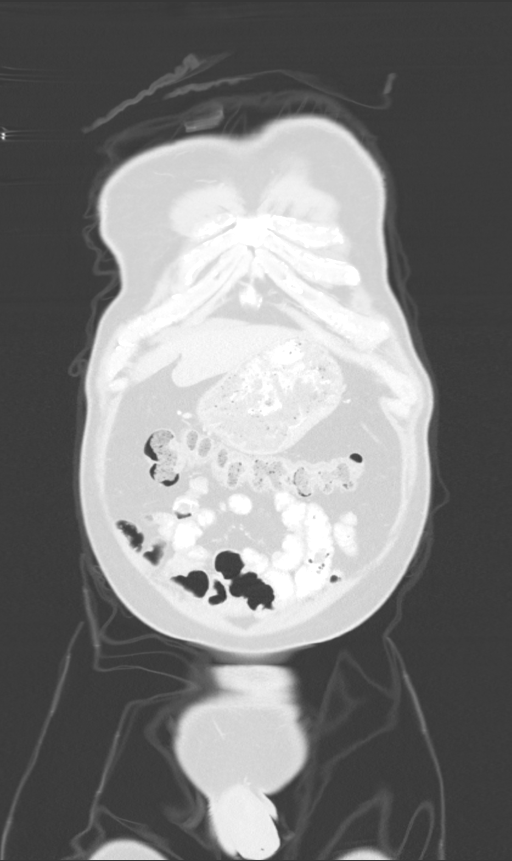
[im 65/163  lung]
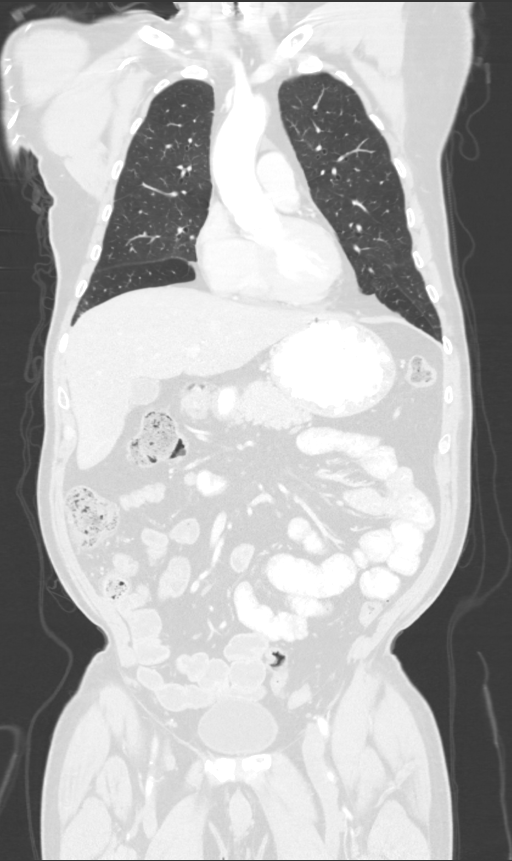
[im 98/163  lung]
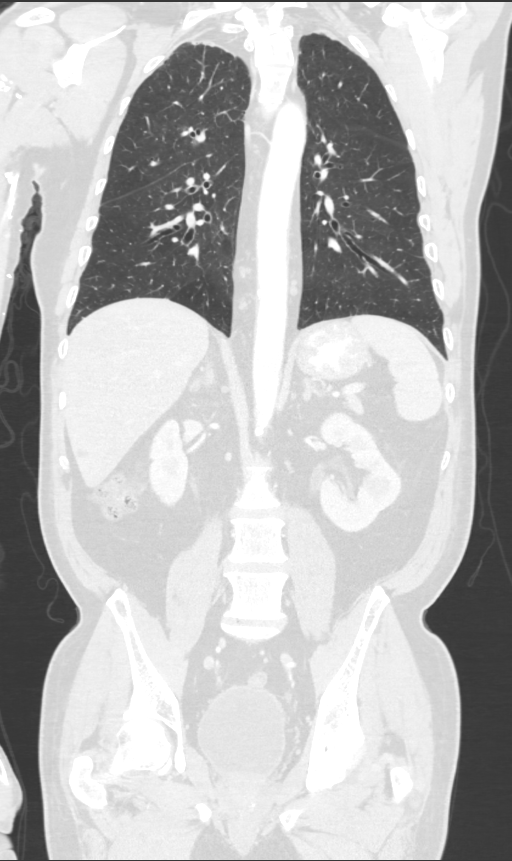

[Series 6: lung · axial · 0.82mm/px · z∈[-681,-73]mm · 9 of 344 slices shown, 12 images]
[im 20/344  mediastinal]
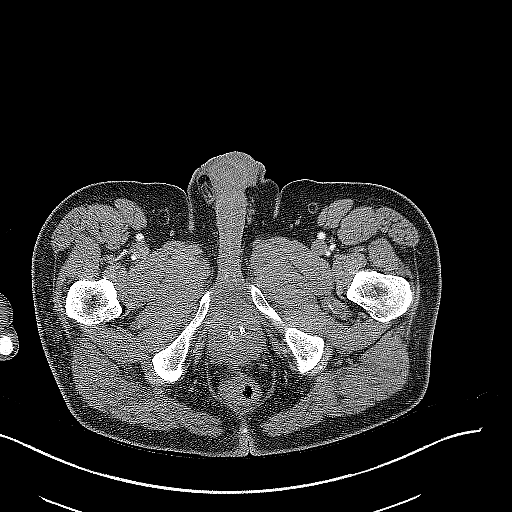
[im 20/344  lung]
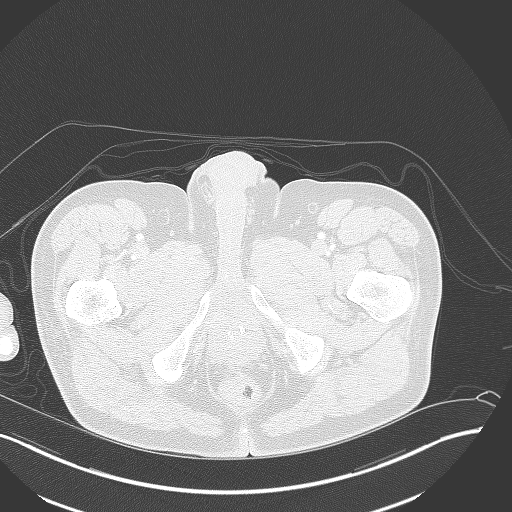
[im 58/344  lung]
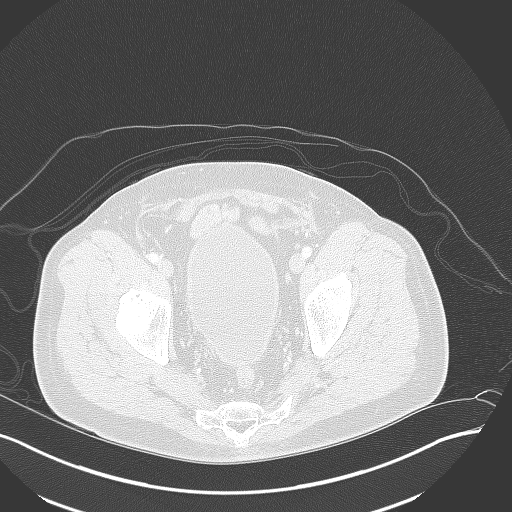
[im 96/344  lung]
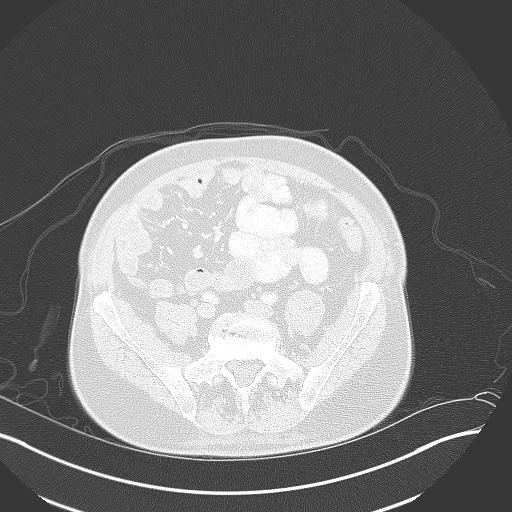
[im 134/344  lung]
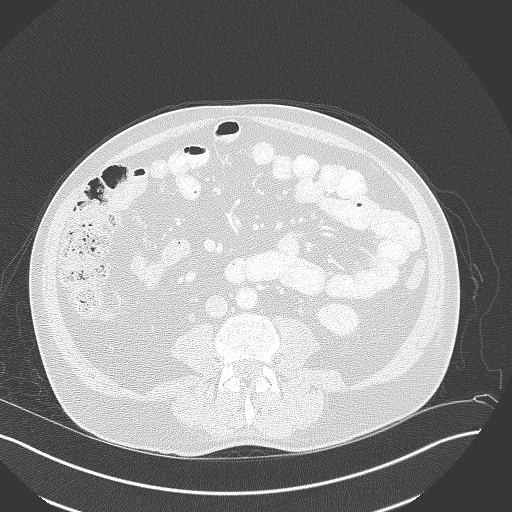
[im 172/344  mediastinal]
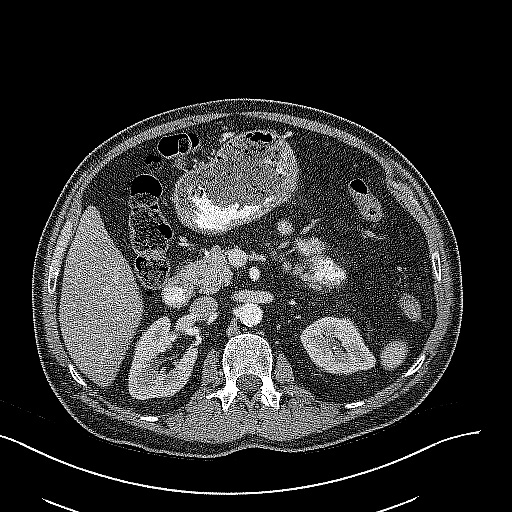
[im 172/344  lung]
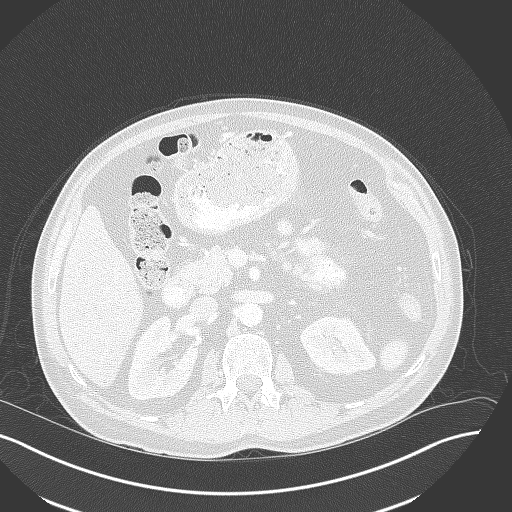
[im 210/344  lung]
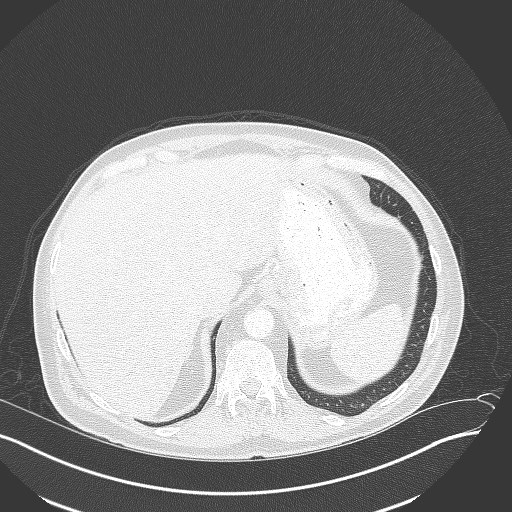
[im 248/344  lung]
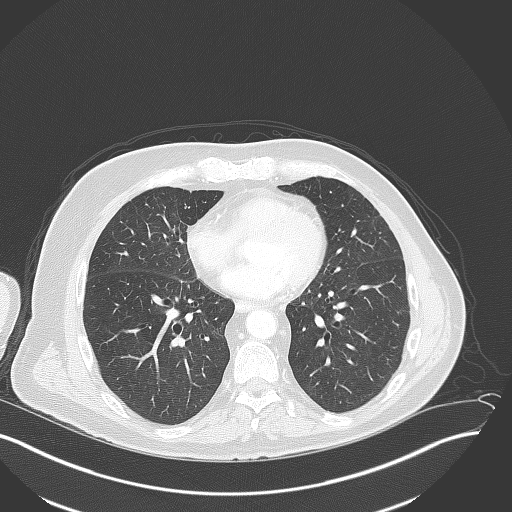
[im 286/344  lung]
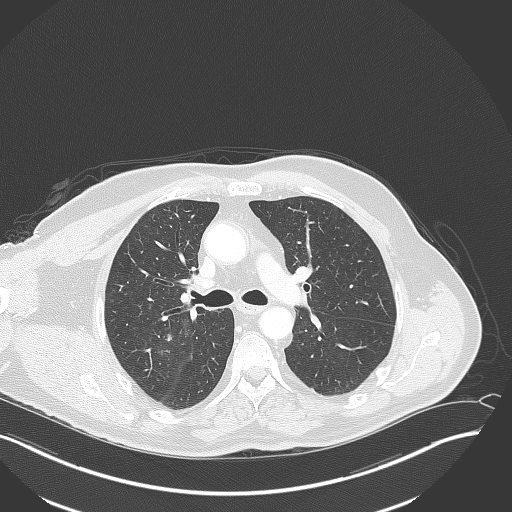
[im 324/344  mediastinal]
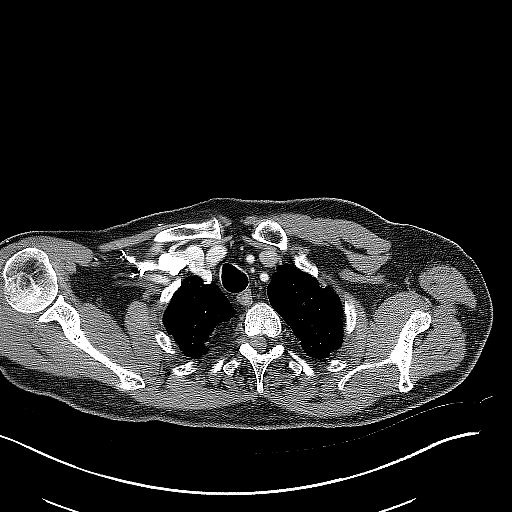
[im 324/344  lung]
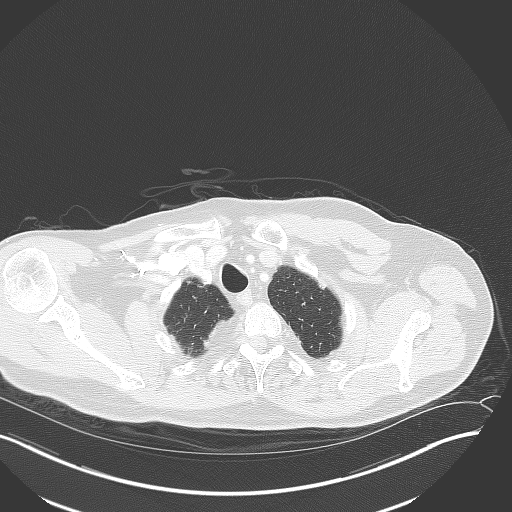

[12 of 36 positions shown; findings below may reference images not displayed]

Imaging Indication: Assess response to therapy

Interval therapy since last imaging? Yes

Initial Cancer Diagnosis

Date: [DATE]; Established by: Biopsy-proven

Detailed Pathology: Stage IV non-small cell lung cancer,
adenocarcinoma.

Primary Tumor location:  Right upper lobe. Brain metastasis.

Surgeries: None.

Chemotherapy: Yes; Ongoing? Yes; Most recent administration:
[DATE]

Immunotherapy?  Yes; Type: Keytruda; Ongoing? Yes

Radiation therapy? Yes; Date Range: [DATE] - [DATE]; Target:
Brain

EXAM:
CT CHEST, ABDOMEN, AND PELVIS WITH CONTRAST
RADIATION DOSE REDUCTION: This exam was performed according to the
departmental dose-optimization program which includes automated
exposure control, adjustment of the mA and/or kV according to
patient size and/or use of iterative reconstruction technique.

CONTRAST:  100mL OMNIPAQUE IOHEXOL 300 MG/ML  SOLN
FINDINGS: CT CHEST FINDINGS

Cardiovascular: No acute findings. Aortic and coronary
atherosclerotic calcification noted.

Mediastinum/Lymph Nodes: No masses or pathologically enlarged lymph
nodes identified.

Lungs/Pleura: 4 x 8 mm bandlike nodular opacity in the peripheral
right upper lobe on image 49/6 is unchanged since previous study.

Multifocal nodular ground-glass opacities are seen throughout the
right upper, middle, and lower lobes which are new since previous
study, and most consistent with atypical infectious or inflammatory
process.

A few small ground-glass nodules in the peripheral left upper and
lower lobes remains stable, largest measuring 6 mm on image 76/6. No
evidence of pleural effusion.

Musculoskeletal:  No suspicious bone lesions identified.

CT ABDOMEN AND PELVIS FINDINGS

Hepatobiliary: Mild diffuse hepatic steatosis is again seen. A small
low-attenuation lesion is again seen in the dome of the left hepatic
lobe which measures 1.6 x 1.2 cm on image 51/2, without significant
change compared to prior study. Tiny sub-cm low-attenuation lesion
in the inferior right hepatic lobe is also stable consistent with
benign cyst or hemangioma. No new or enlarging liver lesions
identified. Gallbladder is unremarkable. No evidence of biliary
ductal dilatation.

Pancreas:  No mass or inflammatory changes.

Spleen:  Within normal limits in size and appearance.

Adrenals/Urinary tract: Normal adrenal glands and right kidney. 9 mm
calculus again seen in upper pole of left kidney. No evidence of
ureteral calculi or hydronephrosis. No masses identified.

Stomach/Bowel: No evidence of obstruction, inflammatory process, or
abnormal fluid collections.

Vascular/Lymphatic: No pathologically enlarged lymph nodes
identified. No acute vascular findings. Aortic atherosclerotic
calcification noted.

Reproductive:  No mass or other significant abnormality identified.

Other:  None.

Musculoskeletal:  No suspicious bone lesions identified.
IMPRESSION: Stable sub-centimeter bandlike nodule in the peripheral right upper
lobe. Stable small ground-glass nodules in left lung.

New multifocal nodular ground-glass opacities throughout the right
lung, most consistent with atypical infectious or inflammatory
process.

Hepatic steatosis. Stable small low-attenuation liver lesions,
likely benign in etiology.

Stable 9 mm left renal calculus. No evidence of ureteral calculi or
hydronephrosis.

Aortic Atherosclerosis ([BW]-[BW]).

## 2021-11-27 IMAGING — CT CT ABD-PELV W/ CM
2 of 5 series · 12 of 36 positions shown, 15 images · IV contrast (OMNIPAQUE)
Comparison: CT on [DATE], and abdomen MRI on [DATE]

CLINICAL DATA: Primary Cancer Type: Lung
TECHNIQUE: Multidetector CT imaging of the chest, abdomen and pelvis was
performed following the standard protocol during bolus
administration of intravenous contrast.

[Series 4: coronals · coronal · 0.80mm/px · 3 of 163 slices shown]
[im 33/163  lung]
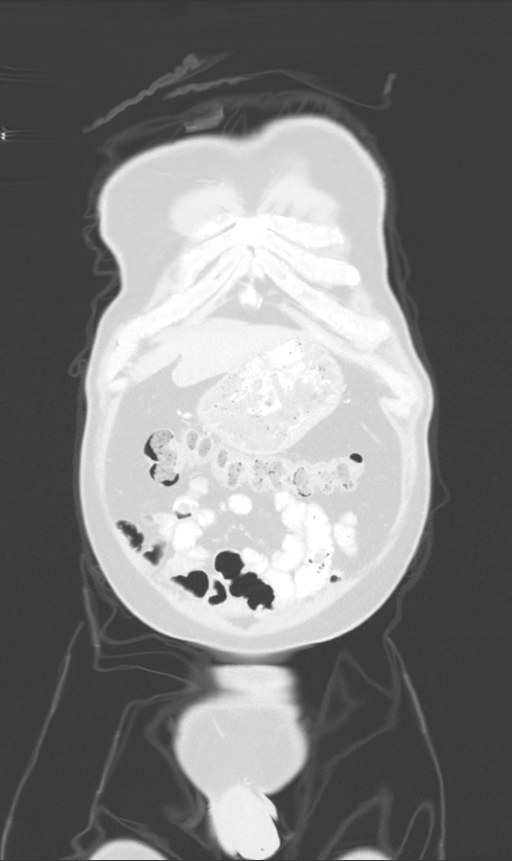
[im 65/163  lung]
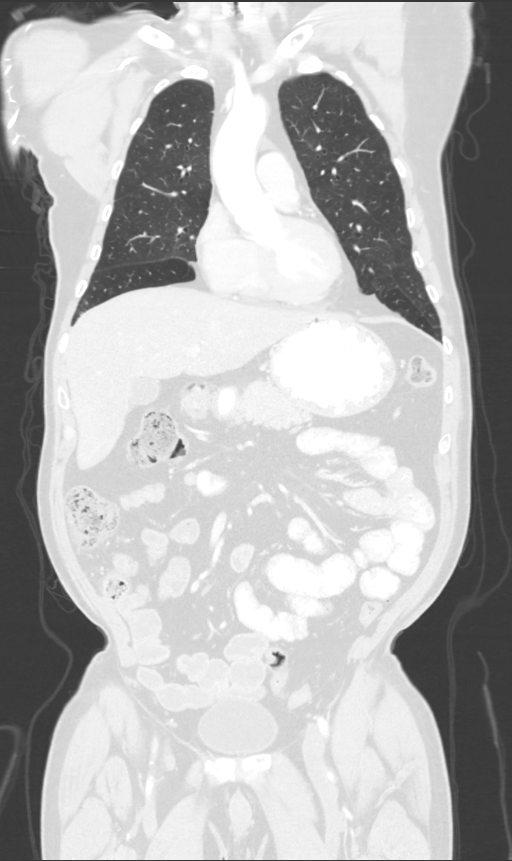
[im 98/163  lung]
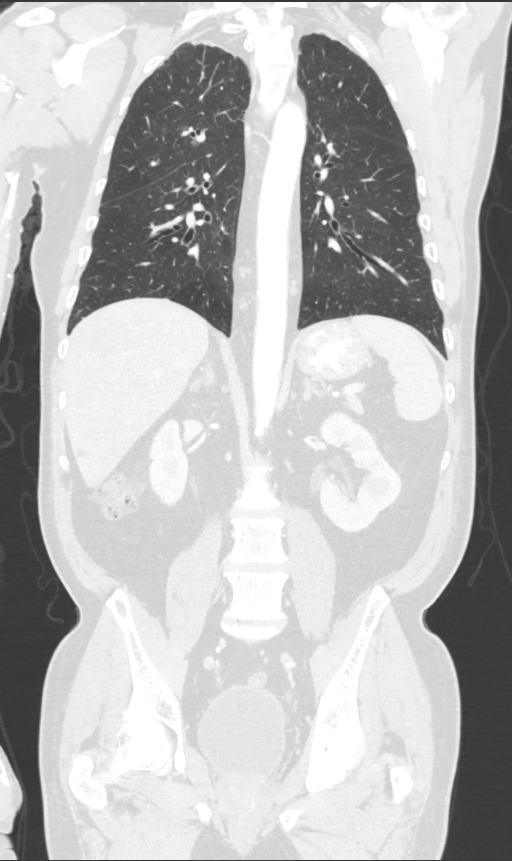

[Series 6: lung · axial · 0.82mm/px · z∈[-681,-73]mm · 9 of 344 slices shown, 12 images]
[im 20/344  mediastinal]
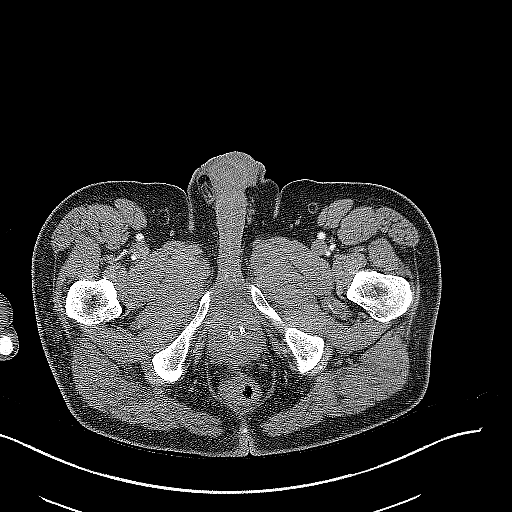
[im 20/344  lung]
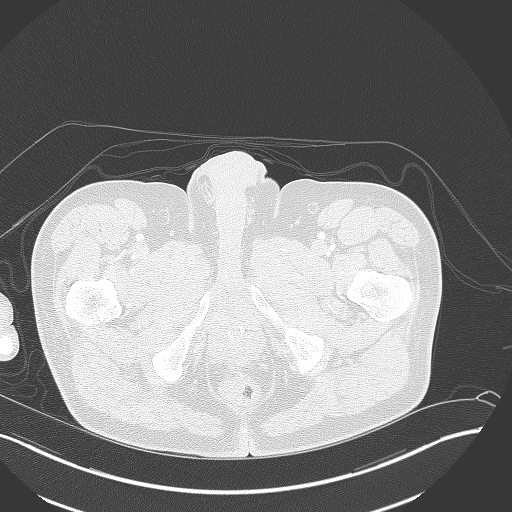
[im 58/344  lung]
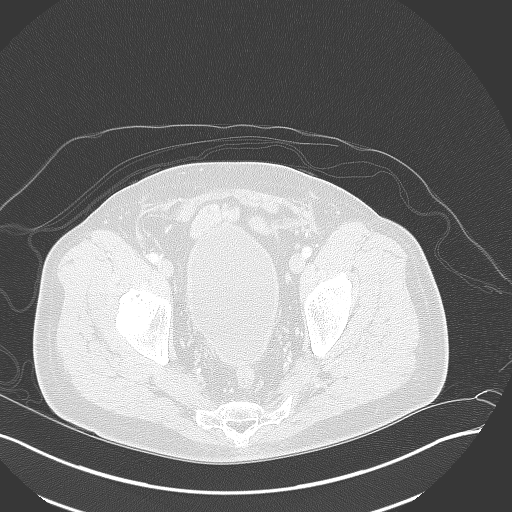
[im 96/344  lung]
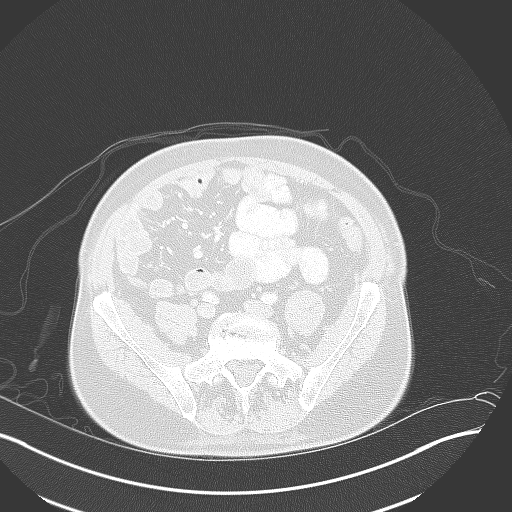
[im 134/344  lung]
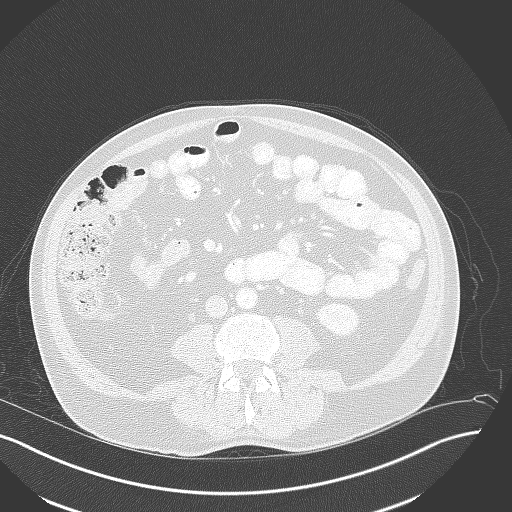
[im 172/344  mediastinal]
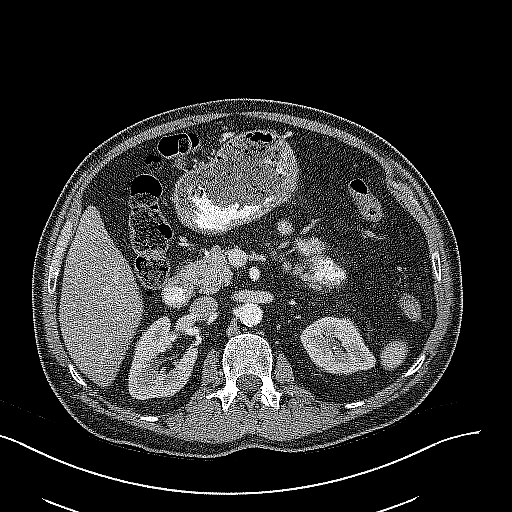
[im 172/344  lung]
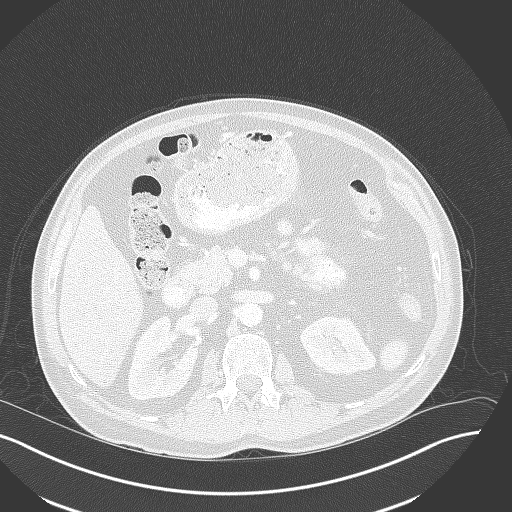
[im 210/344  lung]
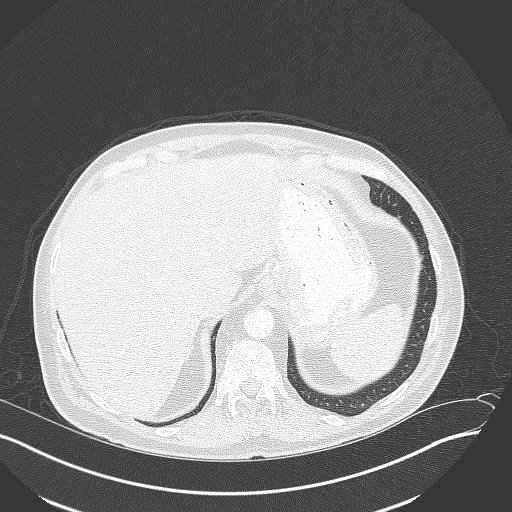
[im 248/344  lung]
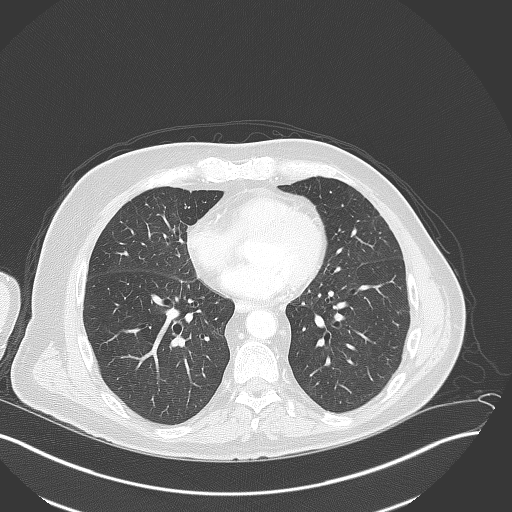
[im 286/344  lung]
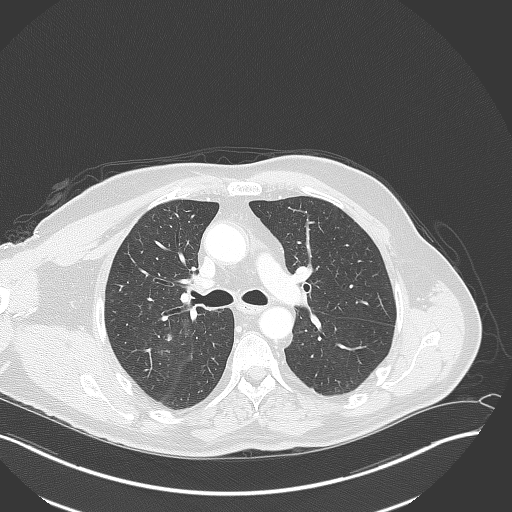
[im 324/344  mediastinal]
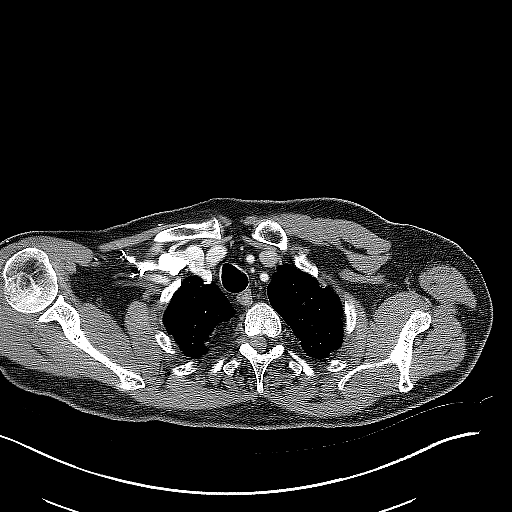
[im 324/344  lung]
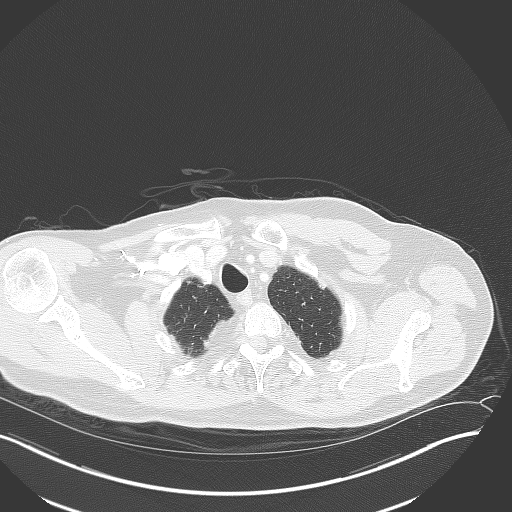

[12 of 36 positions shown; findings below may reference images not displayed]

Imaging Indication: Assess response to therapy

Interval therapy since last imaging? Yes

Initial Cancer Diagnosis

Date: [DATE]; Established by: Biopsy-proven

Detailed Pathology: Stage IV non-small cell lung cancer,
adenocarcinoma.

Primary Tumor location:  Right upper lobe. Brain metastasis.

Surgeries: None.

Chemotherapy: Yes; Ongoing? Yes; Most recent administration:
[DATE]

Immunotherapy?  Yes; Type: Keytruda; Ongoing? Yes

Radiation therapy? Yes; Date Range: [DATE] - [DATE]; Target:
Brain

EXAM:
CT CHEST, ABDOMEN, AND PELVIS WITH CONTRAST
RADIATION DOSE REDUCTION: This exam was performed according to the
departmental dose-optimization program which includes automated
exposure control, adjustment of the mA and/or kV according to
patient size and/or use of iterative reconstruction technique.

CONTRAST:  100mL OMNIPAQUE IOHEXOL 300 MG/ML  SOLN
FINDINGS: CT CHEST FINDINGS

Cardiovascular: No acute findings. Aortic and coronary
atherosclerotic calcification noted.

Mediastinum/Lymph Nodes: No masses or pathologically enlarged lymph
nodes identified.

Lungs/Pleura: 4 x 8 mm bandlike nodular opacity in the peripheral
right upper lobe on image 49/6 is unchanged since previous study.

Multifocal nodular ground-glass opacities are seen throughout the
right upper, middle, and lower lobes which are new since previous
study, and most consistent with atypical infectious or inflammatory
process.

A few small ground-glass nodules in the peripheral left upper and
lower lobes remains stable, largest measuring 6 mm on image 76/6. No
evidence of pleural effusion.

Musculoskeletal:  No suspicious bone lesions identified.

CT ABDOMEN AND PELVIS FINDINGS

Hepatobiliary: Mild diffuse hepatic steatosis is again seen. A small
low-attenuation lesion is again seen in the dome of the left hepatic
lobe which measures 1.6 x 1.2 cm on image 51/2, without significant
change compared to prior study. Tiny sub-cm low-attenuation lesion
in the inferior right hepatic lobe is also stable consistent with
benign cyst or hemangioma. No new or enlarging liver lesions
identified. Gallbladder is unremarkable. No evidence of biliary
ductal dilatation.

Pancreas:  No mass or inflammatory changes.

Spleen:  Within normal limits in size and appearance.

Adrenals/Urinary tract: Normal adrenal glands and right kidney. 9 mm
calculus again seen in upper pole of left kidney. No evidence of
ureteral calculi or hydronephrosis. No masses identified.

Stomach/Bowel: No evidence of obstruction, inflammatory process, or
abnormal fluid collections.

Vascular/Lymphatic: No pathologically enlarged lymph nodes
identified. No acute vascular findings. Aortic atherosclerotic
calcification noted.

Reproductive:  No mass or other significant abnormality identified.

Other:  None.

Musculoskeletal:  No suspicious bone lesions identified.
IMPRESSION: Stable sub-centimeter bandlike nodule in the peripheral right upper
lobe. Stable small ground-glass nodules in left lung.

New multifocal nodular ground-glass opacities throughout the right
lung, most consistent with atypical infectious or inflammatory
process.

Hepatic steatosis. Stable small low-attenuation liver lesions,
likely benign in etiology.

Stable 9 mm left renal calculus. No evidence of ureteral calculi or
hydronephrosis.

Aortic Atherosclerosis ([BW]-[BW]).

## 2021-11-27 MED ORDER — SODIUM CHLORIDE (PF) 0.9 % IJ SOLN
INTRAMUSCULAR | Status: AC
  Filled 2021-11-27: qty 50

## 2021-11-27 MED ORDER — IOHEXOL 300 MG/ML  SOLN
100.0000 mL | Freq: Once | INTRAMUSCULAR | Status: AC | PRN
  Administered 2021-11-27: 100 mL via INTRAVENOUS

## 2021-11-28 ENCOUNTER — Other Ambulatory Visit (HOSPITAL_COMMUNITY): Payer: Self-pay

## 2021-11-28 ENCOUNTER — Encounter: Payer: Self-pay | Admitting: Internal Medicine

## 2021-11-28 MED ORDER — TESTOSTERONE CYPIONATE 100 MG/ML IM SOLN
INTRAMUSCULAR | 0 refills | Status: DC
  Filled 2021-11-28: qty 10, 90d supply, fill #0

## 2021-11-29 ENCOUNTER — Other Ambulatory Visit (HOSPITAL_COMMUNITY): Payer: Self-pay

## 2021-11-30 ENCOUNTER — Other Ambulatory Visit (HOSPITAL_COMMUNITY): Payer: Self-pay

## 2021-11-30 ENCOUNTER — Other Ambulatory Visit: Payer: Self-pay | Admitting: "Endocrinology

## 2021-11-30 ENCOUNTER — Other Ambulatory Visit: Payer: Self-pay | Admitting: Family Medicine

## 2021-11-30 DIAGNOSIS — K219 Gastro-esophageal reflux disease without esophagitis: Secondary | ICD-10-CM

## 2021-11-30 MED ORDER — LEVOTHYROXINE SODIUM 25 MCG PO TABS
25.0000 ug | ORAL_TABLET | Freq: Every day | ORAL | 1 refills | Status: DC
  Filled 2021-11-30: qty 90, 90d supply, fill #0

## 2021-11-30 MED ORDER — PANTOPRAZOLE SODIUM 40 MG PO TBEC
40.0000 mg | DELAYED_RELEASE_TABLET | Freq: Every evening | ORAL | 1 refills | Status: DC
  Filled 2021-11-30: qty 30, 30d supply, fill #0
  Filled 2022-01-03: qty 30, 30d supply, fill #1

## 2021-12-02 NOTE — Progress Notes (Signed)
[facility] OFFICE PROGRESS NOTE  [name], [place] [address]  DIAGNOSIS: Stage IV (T1b, N3, M1C) non-small cell lung cancer, favoring adenocarcinoma presented with right upper lobe lung nodule in addition to right hilar, subcarinal and bilateral mediastinal as well as supraclavicular lymphadenopathy in addition to bone and brain metastasis diagnosed in June 2022.     PD-L1 expression 80%.     Molecular Studies:  Biomarker Findings Microsatellite status - MS-Stable Tumor Mutational Burden - 6 Muts/Mb Genomic Findings For a complete list of the genes assayed, please refer to the Appendix. KRAS G12C, amplification ATM S470* CCND1 amplification - equivocal HGF amplification - equivocal MYC amplification - equivocal FGF19 amplification - equivocal FGF3 amplification - equivocal FGF4 amplification - equivocal NFKBIA amplification NKX2-1 amplification RAD21 amplification - equivocal [id number]*26 TERT promoter -124C>T TP53 rearrangement exon 9 7 Disease relevant genes with no reportable alterations: ALK, BRAF, EGFR, ERBB2, MET, RET, ROS1  PRIOR THERAPY: SRS to the metastatic brain lesions under the care of Dr. MTammi Klippel  Last treatment on 05/04/2021.  CURRENT THERAPY: Palliative systemic chemotherapy with carboplatin for an AUC 5, Alimta 500 mg/m2 and, Keytruda 200 mg IV every 3 weeks.  First dose expected on 05/08/2021.  Status post 10 cycles.  Starting from cycle #5 he is on maintenance treatment with Alimta and Keytruda every 3 weeks.  INTERVAL HISTORY: TUlysess Goodman 66y.o. male returns to the clinic today for a follow-up visit accompanied by his wife.  The patient is feeling fairly well today without any concerning complaints.  He denies any fever, chills, night sweats, or unexplained weight loss.  He reports his breathing is "okay".  He denies any significant dyspnea on exertion out of the ordinary.  Denies any cough,  chest pain, or hemoptysis.  Denies any nausea, vomiting, diarrhea, or constipation.  Denies any headache or visual changes.  The patient is following with his PCP regarding diabetes management.  The patient recently had a restaging CT scan performed.  He is here today for evaluation and to review his scan results before starting cycle #11.  MEDICAL HISTORY: Past Medical History:  Diagnosis Date   Cervical spondylolysis    Essential hypertension    GERD (gastroesophageal reflux disease)    History of kidney stones    History of migraine    Hyperlipidemia    Hypertension    PONV (postoperative nausea and vomiting)    Type 2 diabetes mellitus (HCC)     ALLERGIES:  has No Known Allergies.  MEDICATIONS:  Current Outpatient Medications  Medication Sig Dispense Refill   cholecalciferol (VITAMIN D) 1000 UNITS tablet Take 1,000 Units by mouth every evening.     Continuous Blood Gluc Sensor (FREESTYLE LIBRE 3 SENSOR) MISC Place 1 sensor on the skin every 14 days. Use to check glucose continuously 2 each 2   dexamethasone (DECADRON) 1 MG tablet Take 2 tablets by mouth daily. 180 tablet 1   docusate sodium (COLACE) 100 MG capsule Take 100 mg by mouth daily.     folic acid (FOLVITE) 1 MG tablet Take 1 tablet by mouth daily. 30 tablet 4   Krill Oil 300 MG CAPS Take 300 mg by mouth every evening.     levothyroxine (SYNTHROID) 25 MCG tablet Take 1 tablet by mouth daily before breakfast. 90 tablet 1   loratadine (CLARITIN) 10 MG tablet Take 10 mg by mouth every evening.      losartan (COZAAR) 50 MG tablet Take 1  and 1/2 tablets (75 mg total) by mouth daily. 135 tablet 1   magic mouthwash (nystatin, diphenhydrAMINE, alum & mag hydroxide) suspension mixture Swish and spit 5 mls up to 4 times daily as needed. 240 mL 1   metFORMIN (GLUCOPHAGE XR) 500 MG 24 hr tablet Take 2 tablets by mouth 2  times daily with a meal. 360 tablet 1   mirtazapine (REMERON) 15 MG tablet Take 1 tablet (15 mg total) by mouth at  bedtime. 90 tablet 1   Multiple Vitamins-Minerals (MULTIVITAMIN GUMMIES ADULT PO) Take 2 each by mouth daily.     pantoprazole (PROTONIX) 40 MG tablet Take 1 tablet by mouth every evening. 30 tablet 1   prochlorperazine (COMPAZINE) 10 MG tablet Take 1 tablet (10 mg total) by mouth every 6 (six) hours as needed for nausea or vomiting. 30 tablet 0   rosuvastatin (CRESTOR) 20 MG tablet Take 1 tablet (20 mg total) by mouth daily. 90 tablet 1   SYRINGE-NEEDLE, DISP, 3 ML 21G X 1-1/2" 3 ML MISC Use to inject testosterone every week 100 each 2   testosterone cypionate (DEPO-TESTOSTERONE) 100 MG/ML injection Inject 0.5 mLs (50 mg total) into the muscle every 7 (seven) days. For IM use only 10 mL 0   testosterone cypionate (DEPOTESTOTERONE CYPIONATE) 100 MG/ML injection Inject 0.5 MLs into the muscle every 7 days 10 mL 0   venlafaxine XR (EFFEXOR-XR) 75 MG 24 hr capsule Take 1 capsule (75 mg total) by mouth daily with breakfast. 90 capsule 1   No current facility-administered medications for this visit.    SURGICAL HISTORY:  Past Surgical History:  Procedure Laterality Date   Bilateral inguinal hernia repair     BRONCHIAL NEEDLE ASPIRATION BIOPSY  04/05/2021   Procedure: BRONCHIAL NEEDLE ASPIRATION BIOPSIES;  Surgeon: Garner Nash, DO;  Location: [facility];  Service: Pulmonary;;   COLONOSCOPY  01/23/2012   Procedure: COLONOSCOPY;  Surgeon: Daneil Dolin, MD;  [facility];  Service: Endoscopy;  Laterality: N/A;  9:30 AM   COLONOSCOPY N/A 10/11/2015   Procedure: COLONOSCOPY;  Surgeon: Daneil Dolin, MD;  [facility];  Service: Endoscopy;  Laterality: N/A;  830   COLONOSCOPY N/A 10/14/2019   Procedure: COLONOSCOPY;  Surgeon: Daneil Dolin, MD;  [facility];  Service: Endoscopy;  Laterality: N/A;  1:45   POLYPECTOMY  10/14/2019   Procedure: POLYPECTOMY;  Surgeon: Daneil Dolin, MD;  [facility];  Service: Endoscopy;;  ascending colon, descending colon    VIDEO BRONCHOSCOPY WITH ENDOBRONCHIAL ULTRASOUND N/A 04/05/2021   Procedure: VIDEO BRONCHOSCOPY WITH ENDOBRONCHIAL ULTRASOUND;  Surgeon: Garner Nash, DO;  Location: [facility];  Service: Pulmonary;  Laterality: N/A;    REVIEW OF SYSTEMS:   Review of Systems  Constitutional: Negative for appetite change, chills, fatigue, fever and unexpected weight change.  HENT: Negative for mouth sores, nosebleeds, sore throat and trouble swallowing.   Eyes: Negative for eye problems and icterus.  Respiratory: Negative for cough, hemoptysis, shortness of breath and wheezing.   Cardiovascular: Negative for chest pain and leg swelling.  Gastrointestinal: Negative for abdominal pain, constipation, diarrhea, nausea and vomiting.  Genitourinary: Negative for bladder incontinence, difficulty urinating, dysuria, frequency and hematuria.   Musculoskeletal: Negative for back pain, gait problem, neck pain and neck stiffness.  Skin: Negative for itching and rash.  Neurological: Negative for dizziness, extremity weakness, gait problem, headaches, light-headedness and seizures.  Hematological: Negative for adenopathy. Does not bruise/bleed easily.  Psychiatric/Behavioral: Negative for  confusion, depression and sleep disturbance. The patient is not nervous/anxious.     PHYSICAL EXAMINATION:  Blood pressure 132/85, pulse (!) 113, temperature 98.2 F (36.8 C), temperature source Tympanic, resp. rate 18, weight 182 lb 4 oz (82.7 kg), SpO2 98 %.  ECOG PERFORMANCE STATUS: 1  Physical Exam  Constitutional: Oriented to person, place, and time and well-developed, well-nourished, and in no distress. HENT:  Head: Normocephalic and atraumatic.  Mouth/Throat: Oropharynx is clear and moist. No oropharyngeal exudate.  Eyes: Conjunctivae are normal. Right eye exhibits no discharge. Left eye exhibits no discharge. No scleral icterus.  Neck: Normal range of motion. Neck supple.  Cardiovascular: Normal rate, regular  rhythm, normal heart sounds and intact distal pulses.   Pulmonary/Chest: Effort normal and breath sounds normal. No respiratory distress. No wheezes. No rales.  Abdominal: Soft. Bowel sounds are normal. Exhibits no distension and no mass. There is no tenderness.  Musculoskeletal: Normal range of motion. Exhibits no edema.  Lymphadenopathy:    No cervical adenopathy.  Neurological: Alert and oriented to person, place, and time. Exhibits normal muscle tone. Gait normal. Coordination normal.  Skin: Skin is warm and dry. No rash noted. Not diaphoretic. No erythema. No pallor.  Psychiatric: Mood, memory and judgment normal.  Vitals reviewed.  LABORATORY DATA: Lab Results  Component Value Date   WBC 12.3 (H) 12/04/2021   HGB 12.9 (L) 12/04/2021   HCT 40.6 12/04/2021   MCV 96.2 12/04/2021   PLT 260 12/04/2021      Chemistry      Component Value Date/Time   NA 141 12/04/2021 1012   NA 144 01/10/2021 0849   K 3.7 12/04/2021 1012   CL 105 12/04/2021 1012   CO2 28 12/04/2021 1012   BUN 15 12/04/2021 1012   BUN 15 01/10/2021 0849   CREATININE 0.99 12/04/2021 1012      Component Value Date/Time   CALCIUM 9.2 12/04/2021 1012   ALKPHOS 59 12/04/2021 1012   AST 21 12/04/2021 1012   ALT 30 12/04/2021 1012   BILITOT 0.3 12/04/2021 1012       RADIOGRAPHIC STUDIES:  CT Chest W Contrast  Result Date: 11/28/2021 CLINICAL DATA:  Primary Cancer Type: Lung Imaging Indication: Assess response to therapy Interval therapy since last imaging? Yes Initial Cancer Diagnosis Date: 04/05/2021; Established by: Biopsy-proven Detailed Pathology: Stage IV non-small cell lung cancer, adenocarcinoma. Primary Tumor location:  Right upper lobe. Brain metastasis. Surgeries: None. Chemotherapy: Yes; Ongoing? Yes; Most recent administration: 11/13/2021 Immunotherapy?  Yes; Type: Keytruda; Ongoing? Yes Radiation therapy? Yes; Date Range: 04/24/2021 - 05/04/2021; Target: Brain EXAM: CT CHEST, ABDOMEN, AND PELVIS  WITH CONTRAST TECHNIQUE: Multidetector CT imaging of the chest, abdomen and pelvis was performed following the standard protocol during bolus administration of intravenous contrast. RADIATION DOSE REDUCTION: This exam was performed according to the departmental dose-optimization program which includes automated exposure control, adjustment of the mA and/or kV according to patient size and/or use of iterative reconstruction technique. CONTRAST:  158m OMNIPAQUE IOHEXOL 300 MG/ML  SOLN COMPARISON:  CT on 09/07/2021, and abdomen MRI on 09/18/2021 FINDINGS: CT CHEST FINDINGS Cardiovascular: No acute findings. Aortic and coronary atherosclerotic calcification noted. Mediastinum/Lymph Nodes: No masses or pathologically enlarged lymph nodes identified. Lungs/Pleura: 4 x 8 mm bandlike nodular opacity in the peripheral right upper lobe on image 49/6 is unchanged since previous study. Multifocal nodular ground-glass opacities are seen throughout the right upper, middle, and lower lobes which are new since previous study, and most consistent with atypical infectious or inflammatory  process. A few small ground-glass nodules in the peripheral left upper and lower lobes remains stable, largest measuring 6 mm on image 76/6. No evidence of pleural effusion. Musculoskeletal:  No suspicious bone lesions identified. CT ABDOMEN AND PELVIS FINDINGS Hepatobiliary: Mild diffuse hepatic steatosis is again seen. A small low-attenuation lesion is again seen in the dome of the left hepatic lobe which measures 1.6 x 1.2 cm on image 51/2, without significant change compared to prior study. Tiny sub-cm low-attenuation lesion in the inferior right hepatic lobe is also stable consistent with benign cyst or hemangioma. No new or enlarging liver lesions identified. Gallbladder is unremarkable. No evidence of biliary ductal dilatation. Pancreas:  No mass or inflammatory changes. Spleen:  Within normal limits in size and appearance. Adrenals/Urinary  tract: Normal adrenal glands and right kidney. 9 mm calculus again seen in upper pole of left kidney. No evidence of ureteral calculi or hydronephrosis. No masses identified. Stomach/Bowel: No evidence of obstruction, inflammatory process, or abnormal fluid collections. Vascular/Lymphatic: No pathologically enlarged lymph nodes identified. No acute vascular findings. Aortic atherosclerotic calcification noted. Reproductive:  No mass or other significant abnormality identified. Other:  None. Musculoskeletal:  No suspicious bone lesions identified. IMPRESSION: Stable sub-centimeter bandlike nodule in the peripheral right upper lobe. Stable small ground-glass nodules in left lung. New multifocal nodular ground-glass opacities throughout the right lung, most consistent with atypical infectious or inflammatory process. Hepatic steatosis. Stable small low-attenuation liver lesions, likely benign in etiology. Stable 9 mm left renal calculus. No evidence of ureteral calculi or hydronephrosis. Aortic Atherosclerosis (ICD10-I70.0). Electronically Signed   By: Marlaine Hind M.D.   On: 11/28/2021 11:50   MR BRAIN W WO CONTRAST  Result Date: 11/10/2021 CLINICAL DATA:  Brain/CNS neoplasm, assess treatment response; metastatic lung cancer EXAM: MRI HEAD WITHOUT AND WITH CONTRAST TECHNIQUE: Multiplanar, multiecho pulse sequences of the brain and surrounding structures were obtained without and with intravenous contrast. CONTRAST:  60m MULTIHANCE GADOBENATE DIMEGLUMINE 529 MG/ML IV SOLN COMPARISON:  August 09, 2021 FINDINGS: Brain: Previously described right parieto-occipital enhancing lesion is likely further decreased in size from 2 mm to 1 mm (series 11, image 93). Stable 2 mm left cerebellar enhancing lesion (series 11, image 54). No other abnormal enhancement is identified. There is no acute infarction or intracranial hemorrhage. There is no hydrocephalus or extra-axial fluid collection. Ventricles and sulci are stable in  size and configuration. Patchy and confluent areas of T2 hyperintensity in the supratentorial and pontine white matter are nonspecific but probably reflect similar chronic microvascular ischemic changes. Vascular: Major vessel flow voids at the skull base are preserved. Skull and upper cervical spine: Normal marrow signal is preserved. Sinuses/Orbits: Paranasal sinuses are aerated. Orbits are unremarkable. Other: Further improvement in appearance of the pituitary stalk, which no longer appears thickened on these large field-of-view images. Mastoid air cells are clear. IMPRESSION: Decrease in size of now punctate right parieto-occipital lesion. Stable small left cerebellar lesion. Further improvement in appearance of the pituitary stalk. No evidence of new metastatic disease. Electronically Signed   By: PMacy MisM.D.   On: 11/10/2021 16:45   CT Abdomen Pelvis W Contrast  Result Date: 11/28/2021 CLINICAL DATA:  Primary Cancer Type: Lung Imaging Indication: Assess response to therapy Interval therapy since last imaging? Yes Initial Cancer Diagnosis Date: 04/05/2021; Established by: Biopsy-proven Detailed Pathology: Stage IV non-small cell lung cancer, adenocarcinoma. Primary Tumor location:  Right upper lobe. Brain metastasis. Surgeries: None. Chemotherapy: Yes; Ongoing? Yes; Most recent administration: 11/13/2021 Immunotherapy?  Yes; Type:  Keytruda; Ongoing? Yes Radiation therapy? Yes; Date Range: 04/24/2021 - 05/04/2021; Target: Brain EXAM: CT CHEST, ABDOMEN, AND PELVIS WITH CONTRAST TECHNIQUE: Multidetector CT imaging of the chest, abdomen and pelvis was performed following the standard protocol during bolus administration of intravenous contrast. RADIATION DOSE REDUCTION: This exam was performed according to the departmental dose-optimization program which includes automated exposure control, adjustment of the mA and/or kV according to patient size and/or use of iterative reconstruction technique.  CONTRAST:  128m OMNIPAQUE IOHEXOL 300 MG/ML  SOLN COMPARISON:  CT on 09/07/2021, and abdomen MRI on 09/18/2021 FINDINGS: CT CHEST FINDINGS Cardiovascular: No acute findings. Aortic and coronary atherosclerotic calcification noted. Mediastinum/Lymph Nodes: No masses or pathologically enlarged lymph nodes identified. Lungs/Pleura: 4 x 8 mm bandlike nodular opacity in the peripheral right upper lobe on image 49/6 is unchanged since previous study. Multifocal nodular ground-glass opacities are seen throughout the right upper, middle, and lower lobes which are new since previous study, and most consistent with atypical infectious or inflammatory process. A few small ground-glass nodules in the peripheral left upper and lower lobes remains stable, largest measuring 6 mm on image 76/6. No evidence of pleural effusion. Musculoskeletal:  No suspicious bone lesions identified. CT ABDOMEN AND PELVIS FINDINGS Hepatobiliary: Mild diffuse hepatic steatosis is again seen. A small low-attenuation lesion is again seen in the dome of the left hepatic lobe which measures 1.6 x 1.2 cm on image 51/2, without significant change compared to prior study. Tiny sub-cm low-attenuation lesion in the inferior right hepatic lobe is also stable consistent with benign cyst or hemangioma. No new or enlarging liver lesions identified. Gallbladder is unremarkable. No evidence of biliary ductal dilatation. Pancreas:  No mass or inflammatory changes. Spleen:  Within normal limits in size and appearance. Adrenals/Urinary tract: Normal adrenal glands and right kidney. 9 mm calculus again seen in upper pole of left kidney. No evidence of ureteral calculi or hydronephrosis. No masses identified. Stomach/Bowel: No evidence of obstruction, inflammatory process, or abnormal fluid collections. Vascular/Lymphatic: No pathologically enlarged lymph nodes identified. No acute vascular findings. Aortic atherosclerotic calcification noted. Reproductive:  No mass or  other significant abnormality identified. Other:  None. Musculoskeletal:  No suspicious bone lesions identified. IMPRESSION: Stable sub-centimeter bandlike nodule in the peripheral right upper lobe. Stable small ground-glass nodules in left lung. New multifocal nodular ground-glass opacities throughout the right lung, most consistent with atypical infectious or inflammatory process. Hepatic steatosis. Stable small low-attenuation liver lesions, likely benign in etiology. Stable 9 mm left renal calculus. No evidence of ureteral calculi or hydronephrosis. Aortic Atherosclerosis (ICD10-I70.0). Electronically Signed   By: JMarlaine HindM.D.   On: 11/28/2021 11:50     ASSESSMENT/PLAN:  This is a very pleasant 66year old Caucasian male diagnosed with stage IV (T1b, N3, M1 C) non-small cell lung cancer, favoring adenocarcinoma.  The patient presented with a right upper lobe lung nodule in addition to right hilar, subcarinal, and bilateral mediastinal lymphadenopathy as well as supraclavicular lymphadenopathy.  The patient also has metastatic disease to the brain.  He was diagnosed in June 2022.  His PD-L1 expression is 80%.  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  The patient completed SRS to the brain lesions under the care of Dr. MTammi Klippel  His last treatment was on 05/04/2021. 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   The patient is currently undergoing systemic chemotherapy with carboplatin  for an AUC of 5, Alimta 500 mg per metered squared, Keytruda 200 mg IV every 3 weeks.  Status post 10 cycles. Starting from cycle #5, the patient started maintenance Alimta and Keytruda IV every 3 weeks.   The patient recently had a restaging CT scan. Dr. Julien Nordmann personally and independently reviewed the scan and discussed the results with the patient. The scan showed no  evidence of disease progression. Dr. Julien Nordmann recommends that he continue with cycle #11 today as scheduled.   We will see him back for a follow up visit in 3 weeks for evaluation and repeat blood work before starting cycle #12.   The patient was advised to call immediately if he has any concerning symptoms in the interval. The patient voices understanding of current disease status and treatment options and is in agreement with the current care plan. All questions were answered. The patient knows to call the clinic with any problems, questions or concerns. We can certainly see the patient much sooner if necessary  No orders of the defined types were placed in this encounter.    Liona Wengert L Andriy Sherk, PA-C 12/04/21  ADDENDUM: Hematology/Oncology Attending: I had a face-to-face encounter with the patient today.  I reviewed his record, lab, scan and recommended his care plan.  This is a very pleasant 66 years old white male with a stage IV non-small cell lung cancer favoring adenocarcinoma diagnosed in June 2022 with PD-L1 expression of 80% and positive KRAS G12C mutation. The patient initially underwent SRS to brain lesions completed on May 04, 2021. He is currently undergoing systemic chemotherapy initially with induction carboplatin, Alimta and Keytruda for 4 cycles and is starting from cycle #5 he is on maintenance treatment with Alimta and Keytruda every 3 weeks.  He is status post 10 cycles of treatment. The patient has been tolerating this treatment well with no concerning adverse effects. He had repeat CT scan of the chest, abdomen pelvis performed recently.  I personally and independently reviewed the scan and discussed the result with the patient and his wife. His scan showed no concerning findings for disease progression. I recommended for the patient to continue his treatment with maintenance Alimta and Keytruda every 3 weeks and he will proceed with cycle #11 today. I will see  him back for follow-up visit in 3 weeks for evaluation before the next cycle of his treatment. The patient was advised to call immediately if she has any other concerning symptoms in the interval. The total time spent in the appointment was 30 minutes.  Disclaimer: This note was dictated with voice recognition software. Similar sounding words can inadvertently be transcribed and may be missed upon review. Eilleen Kempf, MD

## 2021-12-04 ENCOUNTER — Inpatient Hospital Stay: Payer: 59

## 2021-12-04 ENCOUNTER — Inpatient Hospital Stay (HOSPITAL_BASED_OUTPATIENT_CLINIC_OR_DEPARTMENT_OTHER): Payer: 59 | Admitting: Physician Assistant

## 2021-12-04 ENCOUNTER — Other Ambulatory Visit: Payer: Self-pay

## 2021-12-04 VITALS — HR 106

## 2021-12-04 VITALS — BP 132/85 | HR 113 | Temp 98.2°F | Resp 18 | Wt 182.2 lb

## 2021-12-04 DIAGNOSIS — C3491 Malignant neoplasm of unspecified part of right bronchus or lung: Secondary | ICD-10-CM | POA: Diagnosis not present

## 2021-12-04 DIAGNOSIS — C3411 Malignant neoplasm of upper lobe, right bronchus or lung: Secondary | ICD-10-CM | POA: Diagnosis not present

## 2021-12-04 DIAGNOSIS — E119 Type 2 diabetes mellitus without complications: Secondary | ICD-10-CM | POA: Diagnosis not present

## 2021-12-04 DIAGNOSIS — Z5112 Encounter for antineoplastic immunotherapy: Secondary | ICD-10-CM | POA: Diagnosis not present

## 2021-12-04 DIAGNOSIS — C7951 Secondary malignant neoplasm of bone: Secondary | ICD-10-CM | POA: Diagnosis not present

## 2021-12-04 DIAGNOSIS — Z5111 Encounter for antineoplastic chemotherapy: Secondary | ICD-10-CM | POA: Diagnosis not present

## 2021-12-04 DIAGNOSIS — Z7984 Long term (current) use of oral hypoglycemic drugs: Secondary | ICD-10-CM | POA: Diagnosis not present

## 2021-12-04 DIAGNOSIS — C7931 Secondary malignant neoplasm of brain: Secondary | ICD-10-CM | POA: Diagnosis not present

## 2021-12-04 DIAGNOSIS — E538 Deficiency of other specified B group vitamins: Secondary | ICD-10-CM | POA: Diagnosis not present

## 2021-12-04 DIAGNOSIS — Z79899 Other long term (current) drug therapy: Secondary | ICD-10-CM | POA: Diagnosis not present

## 2021-12-04 LAB — CMP (CANCER CENTER ONLY)
ALT: 30 U/L (ref 0–44)
AST: 21 U/L (ref 15–41)
Albumin: 3.9 g/dL (ref 3.5–5.0)
Alkaline Phosphatase: 59 U/L (ref 38–126)
Anion gap: 8 (ref 5–15)
BUN: 15 mg/dL (ref 8–23)
CO2: 28 mmol/L (ref 22–32)
Calcium: 9.2 mg/dL (ref 8.9–10.3)
Chloride: 105 mmol/L (ref 98–111)
Creatinine: 0.99 mg/dL (ref 0.61–1.24)
GFR, Estimated: >60 mL/min
Glucose, Bld: 211 mg/dL — ABNORMAL HIGH (ref 70–99)
Potassium: 3.7 mmol/L (ref 3.5–5.1)
Sodium: 141 mmol/L (ref 135–145)
Total Bilirubin: 0.3 mg/dL (ref 0.3–1.2)
Total Protein: 6.3 g/dL — ABNORMAL LOW (ref 6.5–8.1)

## 2021-12-04 LAB — CBC WITH DIFFERENTIAL (CANCER CENTER ONLY)
Abs Immature Granulocytes: 0.13 K/uL — ABNORMAL HIGH (ref 0.00–0.07)
Basophils Absolute: 0 K/uL (ref 0.0–0.1)
Basophils Relative: 0 %
Eosinophils Absolute: 0.1 K/uL (ref 0.0–0.5)
Eosinophils Relative: 0 %
HCT: 40.6 % (ref 39.0–52.0)
Hemoglobin: 12.9 g/dL — ABNORMAL LOW (ref 13.0–17.0)
Immature Granulocytes: 1 %
Lymphocytes Relative: 11 %
Lymphs Abs: 1.3 K/uL (ref 0.7–4.0)
MCH: 30.6 pg (ref 26.0–34.0)
MCHC: 31.8 g/dL (ref 30.0–36.0)
MCV: 96.2 fL (ref 80.0–100.0)
Monocytes Absolute: 0.7 K/uL (ref 0.1–1.0)
Monocytes Relative: 6 %
Neutro Abs: 10.1 K/uL — ABNORMAL HIGH (ref 1.7–7.7)
Neutrophils Relative %: 82 %
Platelet Count: 260 K/uL (ref 150–400)
RBC: 4.22 MIL/uL (ref 4.22–5.81)
RDW: 16.5 % — ABNORMAL HIGH (ref 11.5–15.5)
WBC Count: 12.3 K/uL — ABNORMAL HIGH (ref 4.0–10.5)
nRBC: 0 % (ref 0.0–0.2)

## 2021-12-04 LAB — TSH: TSH: 1.064 u[IU]/mL (ref 0.320–4.118)

## 2021-12-04 MED ORDER — SODIUM CHLORIDE 0.9 % IV SOLN
200.0000 mg | Freq: Once | INTRAVENOUS | Status: AC
  Administered 2021-12-04: 200 mg via INTRAVENOUS
  Filled 2021-12-04: qty 200

## 2021-12-04 MED ORDER — SODIUM CHLORIDE 0.9 % IV SOLN
500.0000 mg/m2 | Freq: Once | INTRAVENOUS | Status: AC
  Administered 2021-12-04: 1000 mg via INTRAVENOUS
  Filled 2021-12-04: qty 40

## 2021-12-04 MED ORDER — PROCHLORPERAZINE MALEATE 10 MG PO TABS
10.0000 mg | ORAL_TABLET | Freq: Once | ORAL | Status: AC
  Administered 2021-12-04: 10 mg via ORAL
  Filled 2021-12-04: qty 1

## 2021-12-04 MED ORDER — SODIUM CHLORIDE 0.9 % IV SOLN: Freq: Once | INTRAVENOUS | Status: AC

## 2021-12-04 NOTE — Patient Instructions (Signed)
[facility]  Discharge Instructions: Thank you for choosing [facility] to provide your oncology and hematology care.   If you have a lab appointment with the Cancer Center, please go directly to the Cancer Center and check in at the registration area.   Wear comfortable clothing and clothing appropriate for easy access to any Portacath or PICC line.   We strive to give you quality time with your provider. You may need to reschedule your appointment if you arrive late (15 or more minutes).  Arriving late affects you and other patients whose appointments are after yours.  Also, if you miss three or more appointments without notifying the office, you may be dismissed from the clinic at the provider's discretion.      For prescription refill requests, have your pharmacy contact our office and allow 72 hours for refills to be completed.    Today you received the following chemotherapy and/or immunotherapy agents Pembrolizumab (Keytruda) and Pemetrexed (Alimta)      To help prevent nausea and vomiting after your treatment, we encourage you to take your nausea medication as directed.  BELOW ARE SYMPTOMS THAT SHOULD BE REPORTED IMMEDIATELY: *FEVER GREATER THAN 100.4 F (38 C) OR HIGHER *CHILLS OR SWEATING *NAUSEA AND VOMITING THAT IS NOT CONTROLLED WITH YOUR NAUSEA MEDICATION *UNUSUAL SHORTNESS OF BREATH *UNUSUAL BRUISING OR BLEEDING *URINARY PROBLEMS (pain or burning when urinating, or frequent urination) *BOWEL PROBLEMS (unusual diarrhea, constipation, pain near the anus) TENDERNESS IN MOUTH AND THROAT WITH OR WITHOUT PRESENCE OF ULCERS (sore throat, sores in mouth, or a toothache) UNUSUAL RASH, SWELLING OR PAIN  UNUSUAL VAGINAL DISCHARGE OR ITCHING   Items with * indicate a potential emergency and should be followed up as soon as possible or go to the Emergency Department if any problems should occur.  Please show the CHEMOTHERAPY ALERT CARD or  IMMUNOTHERAPY ALERT CARD at check-in to the Emergency Department and triage nurse.  Should you have questions after your visit or need to cancel or reschedule your appointment, please contact [facility]  Dept: [phone]  and follow the prompts.  Office hours are 8:00 a.m. to 4:30 p.m. Monday - Friday. Please note that voicemails left after 4:00 p.m. may not be returned until the following business day.  We are closed weekends and major holidays. You have access to a nurse at all times for urgent questions. Please call the main number to the clinic Dept: [phone] and follow the prompts.   For any non-urgent questions, you may also contact your provider using MyChart. We now offer e-Visits for anyone 18 and older to request care online for non-urgent symptoms. For details visit [email or web address].   Also download the MyChart app! Go to the app store, search "MyChart", open the app, select , and log in with your MyChart username and password.  Due to Covid, a mask is required upon entering the hospital/clinic. If you do not have a mask, one will be given to you upon arrival. For doctor visits, patients may have 1 support person aged 18 or older with them. For treatment visits, patients cannot have anyone with them due to current Covid guidelines and our immunocompromised population.   

## 2021-12-04 NOTE — Progress Notes (Signed)
Per Cassie, ok to treat with HR of 106.

## 2021-12-06 ENCOUNTER — Telehealth: Payer: Self-pay | Admitting: Internal Medicine

## 2021-12-06 NOTE — Telephone Encounter (Signed)
Rescheduled 04/10 appointment per patient's request, patient is notified of new upcoming appointment time. ?

## 2021-12-13 ENCOUNTER — Other Ambulatory Visit (HOSPITAL_COMMUNITY): Payer: Self-pay

## 2021-12-21 ENCOUNTER — Other Ambulatory Visit (HOSPITAL_COMMUNITY): Payer: Self-pay

## 2021-12-25 ENCOUNTER — Other Ambulatory Visit: Payer: Self-pay

## 2021-12-25 ENCOUNTER — Encounter: Payer: Self-pay | Admitting: Internal Medicine

## 2021-12-25 ENCOUNTER — Inpatient Hospital Stay: Payer: 59 | Admitting: Internal Medicine

## 2021-12-25 ENCOUNTER — Inpatient Hospital Stay: Payer: 59 | Attending: Physician Assistant

## 2021-12-25 ENCOUNTER — Inpatient Hospital Stay: Payer: 59

## 2021-12-25 VITALS — HR 89

## 2021-12-25 VITALS — BP 134/82 | HR 103 | Temp 97.2°F | Resp 18 | Wt 184.4 lb

## 2021-12-25 DIAGNOSIS — C7931 Secondary malignant neoplasm of brain: Secondary | ICD-10-CM | POA: Insufficient documentation

## 2021-12-25 DIAGNOSIS — C3491 Malignant neoplasm of unspecified part of right bronchus or lung: Secondary | ICD-10-CM

## 2021-12-25 DIAGNOSIS — Z7984 Long term (current) use of oral hypoglycemic drugs: Secondary | ICD-10-CM | POA: Diagnosis not present

## 2021-12-25 DIAGNOSIS — E119 Type 2 diabetes mellitus without complications: Secondary | ICD-10-CM | POA: Diagnosis not present

## 2021-12-25 DIAGNOSIS — Z79899 Other long term (current) drug therapy: Secondary | ICD-10-CM | POA: Insufficient documentation

## 2021-12-25 DIAGNOSIS — C3411 Malignant neoplasm of upper lobe, right bronchus or lung: Secondary | ICD-10-CM | POA: Diagnosis not present

## 2021-12-25 DIAGNOSIS — F17210 Nicotine dependence, cigarettes, uncomplicated: Secondary | ICD-10-CM | POA: Insufficient documentation

## 2021-12-25 DIAGNOSIS — Z5112 Encounter for antineoplastic immunotherapy: Secondary | ICD-10-CM | POA: Insufficient documentation

## 2021-12-25 DIAGNOSIS — E538 Deficiency of other specified B group vitamins: Secondary | ICD-10-CM | POA: Insufficient documentation

## 2021-12-25 DIAGNOSIS — Z5111 Encounter for antineoplastic chemotherapy: Secondary | ICD-10-CM | POA: Insufficient documentation

## 2021-12-25 DIAGNOSIS — C7951 Secondary malignant neoplasm of bone: Secondary | ICD-10-CM | POA: Diagnosis not present

## 2021-12-25 DIAGNOSIS — Z923 Personal history of irradiation: Secondary | ICD-10-CM | POA: Diagnosis not present

## 2021-12-25 LAB — CBC WITH DIFFERENTIAL (CANCER CENTER ONLY)
Abs Immature Granulocytes: 0.11 10*3/uL — ABNORMAL HIGH (ref 0.00–0.07)
Basophils Absolute: 0 10*3/uL (ref 0.0–0.1)
Basophils Relative: 0 %
Eosinophils Absolute: 0.1 10*3/uL (ref 0.0–0.5)
Eosinophils Relative: 1 %
HCT: 43.4 % (ref 39.0–52.0)
Hemoglobin: 13.6 g/dL (ref 13.0–17.0)
Immature Granulocytes: 1 %
Lymphocytes Relative: 10 %
Lymphs Abs: 1.3 10*3/uL (ref 0.7–4.0)
MCH: 29.8 pg (ref 26.0–34.0)
MCHC: 31.3 g/dL (ref 30.0–36.0)
MCV: 95.2 fL (ref 80.0–100.0)
Monocytes Absolute: 0.6 10*3/uL (ref 0.1–1.0)
Monocytes Relative: 5 %
Neutro Abs: 10.3 10*3/uL — ABNORMAL HIGH (ref 1.7–7.7)
Neutrophils Relative %: 83 %
Platelet Count: 233 10*3/uL (ref 150–400)
RBC: 4.56 MIL/uL (ref 4.22–5.81)
RDW: 16.6 % — ABNORMAL HIGH (ref 11.5–15.5)
WBC Count: 12.3 10*3/uL — ABNORMAL HIGH (ref 4.0–10.5)
nRBC: 0 % (ref 0.0–0.2)

## 2021-12-25 LAB — CMP (CANCER CENTER ONLY)
ALT: 31 U/L (ref 0–44)
AST: 20 U/L (ref 15–41)
Albumin: 4.3 g/dL (ref 3.5–5.0)
Alkaline Phosphatase: 70 U/L (ref 38–126)
Anion gap: 9 (ref 5–15)
BUN: 16 mg/dL (ref 8–23)
CO2: 28 mmol/L (ref 22–32)
Calcium: 9.5 mg/dL (ref 8.9–10.3)
Chloride: 103 mmol/L (ref 98–111)
Creatinine: 0.98 mg/dL (ref 0.61–1.24)
GFR, Estimated: >60 mL/min (ref >60)
Glucose, Bld: 169 mg/dL — ABNORMAL HIGH (ref 70–99)
Potassium: 3.8 mmol/L (ref 3.5–5.1)
Sodium: 140 mmol/L (ref 135–145)
Total Bilirubin: 0.3 mg/dL (ref 0.3–1.2)
Total Protein: 7.3 g/dL (ref 6.5–8.1)

## 2021-12-25 LAB — TSH: TSH: 1.132 u[IU]/mL (ref 0.320–4.118)

## 2021-12-25 MED ORDER — SODIUM CHLORIDE 0.9 % IV SOLN: Freq: Once | INTRAVENOUS | Status: AC

## 2021-12-25 MED ORDER — SODIUM CHLORIDE 0.9 % IV SOLN
500.0000 mg/m2 | Freq: Once | INTRAVENOUS | Status: AC
  Administered 2021-12-25: 1000 mg via INTRAVENOUS
  Filled 2021-12-25: qty 40

## 2021-12-25 MED ORDER — CYANOCOBALAMIN 1000 MCG/ML IJ SOLN
1000.0000 ug | Freq: Once | INTRAMUSCULAR | Status: AC
  Administered 2021-12-25: 1000 ug via INTRAMUSCULAR
  Filled 2021-12-25: qty 1

## 2021-12-25 MED ORDER — PROCHLORPERAZINE MALEATE 10 MG PO TABS
10.0000 mg | ORAL_TABLET | Freq: Once | ORAL | Status: AC
  Administered 2021-12-25: 10 mg via ORAL
  Filled 2021-12-25: qty 1

## 2021-12-25 MED ORDER — SODIUM CHLORIDE 0.9 % IV SOLN
200.0000 mg | Freq: Once | INTRAVENOUS | Status: AC
  Administered 2021-12-25: 200 mg via INTRAVENOUS
  Filled 2021-12-25: qty 200

## 2021-12-25 NOTE — Progress Notes (Signed)
?    [facility] ?[phone]   [phone] ? ?OFFICE PROGRESS NOTE ? ?[address], Bradley [address] ?[place] [address] ? ?DIAGNOSIS: Stage IV (T1b, N3, M1C) non-small cell lung cancer, favoring adenocarcinoma presented with right upper lobe lung nodule in addition to right hilar, subcarinal and bilateral mediastinal as well as supraclavicular lymphadenopathy in addition to bone and brain metastasis diagnosed in June 2022. ?  ?  ?PD-L1 expression 80%.   ?  ?Molecular Studies:  ?Biomarker Findings ?Microsatellite status - MS-Stable ?Tumor Mutational Burden - 6 Muts/Mb ?Genomic Findings ?For a complete list of the genes assayed, please refer to the Appendix. ?KRAS G12C, amplification ?ATM [phone]* ?CCND1 amplification - ?equivocal? ?HGF amplification - ?equivocal? ?MYC amplification - ?equivocal? ?FGF19 amplification - ?equivocal? ?FGF3 amplification - ?equivocal? ?FGF4 amplification - ?equivocal? ?NFKBIA amplification ?NKX2-1 amplification ?RAD21 amplification - ?equivocal? ?[id number]*26 ?TERT promoter -124C>T ?TP53 rearrangement ?exon 9 ?7 Disease relevant genes with no reportable ?alterations: ALK, BRAF, EGFR, ERBB2, MET, RET, ROS1 ?  ?PRIOR THERAPY: SRS to the metastatic brain lesions under the care of Dr. MTammi Klippel  Last treatment on 05/04/2021. ?  ?CURRENT THERAPY: Palliative systemic chemotherapy with carboplatin for an AUC 5, Alimta 500 mg/m2 and, Keytruda 200 mg IV every 3 weeks.  First dose expected on 05/08/2021.  Status post 11 cycles.  Starting from cycle #5 he is on maintenance treatment with Alimta and Keytruda every 3 weeks. ? ?INTERVAL HISTORY: ?[name] 66y.o. male returns to the clinic today for follow-up visit accompanied by his wife.  The patient is feeling fine today with no concerning complaints.  He has no chest pain, shortness of breath, cough or hemoptysis.  He denied having any fever or chills.  He has no nausea, vomiting, diarrhea or  constipation.  He has no significant weight loss or night sweats.  He continues to tolerate his maintenance treatment with Alimta and Keytruda fairly well.  The patient is here today for evaluation before starting cycle #12. ? ?MEDICAL HISTORY: ?Past Medical History:  ?Diagnosis Date  ? Cervical spondylolysis   ? Essential hypertension   ? GERD (gastroesophageal reflux disease)   ? History of kidney stones   ? History of migraine   ? Hyperlipidemia   ? Hypertension   ? PONV (postoperative nausea and vomiting)   ? Type 2 diabetes mellitus ([place]   ? ? ?ALLERGIES:  has No Known Allergies. ? ?MEDICATIONS:  ?Current Outpatient Medications  ?Medication Sig Dispense Refill  ? cholecalciferol (VITAMIN D) 1000 UNITS tablet Take 1,000 Units by mouth every evening.    ? Continuous Blood Gluc Sensor (FREESTYLE LIBRE 3 SENSOR) MISC Place 1 sensor on the skin every 14 days. Use to check glucose continuously 2 each 2  ? dexamethasone (DECADRON) 1 MG tablet Take 2 tablets by mouth daily. 180 tablet 1  ? docusate sodium (COLACE) 100 MG capsule Take 100 mg by mouth daily.    ? folic acid (FOLVITE) 1 MG tablet Take 1 tablet by mouth daily. 30 tablet 4  ? Krill Oil 300 MG CAPS Take 300 mg by mouth every evening.    ? levothyroxine (SYNTHROID) 25 MCG tablet Take 1 tablet by mouth daily before breakfast. 90 tablet 1  ? loratadine (CLARITIN) 10 MG tablet Take 10 mg by mouth every evening.     ? losartan (COZAAR) 50 MG tablet Take 1 and 1/2 tablets (75 mg total) by mouth daily. 135 tablet 1  ? magic mouthwash (  nystatin, diphenhydrAMINE, alum & mag hydroxide) suspension mixture Swish and spit 5 mls up to 4 times daily as needed. 240 mL 1  ? metFORMIN (GLUCOPHAGE XR) 500 MG 24 hr tablet Take 2 tablets by mouth 2  times daily with a meal. 360 tablet 1  ? mirtazapine (REMERON) 15 MG tablet Take 1 tablet (15 mg total) by mouth at bedtime. 90 tablet 1  ? Multiple Vitamins-Minerals (MULTIVITAMIN GUMMIES ADULT PO) Take 2 each by mouth daily.    ?  pantoprazole (PROTONIX) 40 MG tablet Take 1 tablet by mouth every evening. 30 tablet 1  ? prochlorperazine (COMPAZINE) 10 MG tablet Take 1 tablet (10 mg total) by mouth every 6 (six) hours as needed for nausea or vomiting. 30 tablet 0  ? rosuvastatin (CRESTOR) 20 MG tablet Take 1 tablet (20 mg total) by mouth daily. 90 tablet 1  ? SYRINGE-NEEDLE, DISP, 3 ML 21G X 1-1/2" 3 ML MISC Use to inject testosterone every week 100 each 2  ? testosterone cypionate (DEPO-TESTOSTERONE) 100 MG/ML injection Inject 0.5 mLs (50 mg total) into the muscle every 7 (seven) days. For IM use only 10 mL 0  ? testosterone cypionate (DEPOTESTOTERONE CYPIONATE) 100 MG/ML injection Inject 0.5 MLs into the muscle every 7 days 10 mL 0  ? venlafaxine XR (EFFEXOR-XR) 75 MG 24 hr capsule Take 1 capsule (75 mg total) by mouth daily with breakfast. 90 capsule 1  ? ?No current facility-administered medications for this visit.  ? ? ?SURGICAL HISTORY:  ?Past Surgical History:  ?Procedure Laterality Date  ? Bilateral inguinal hernia repair    ? BRONCHIAL NEEDLE ASPIRATION BIOPSY  04/05/2021  ? Procedure: BRONCHIAL NEEDLE ASPIRATION BIOPSIES;  Surgeon: Garner Nash, DO;  Location: [facility];  Service: Pulmonary;;  ? COLONOSCOPY  01/23/2012  ? Procedure: COLONOSCOPY;  Surgeon: Daneil Dolin, MD;  [facility];  Service: Endoscopy;  Laterality: N/A;  9:30 AM  ? COLONOSCOPY N/A 10/11/2015  ? Procedure: COLONOSCOPY;  Surgeon: Daneil Dolin, MD;  [facility];  Service: Endoscopy;  Laterality: N/A;  830  ? COLONOSCOPY N/A 10/14/2019  ? Procedure: COLONOSCOPY;  Surgeon: Daneil Dolin, MD;  [facility];  Service: Endoscopy;  Laterality: N/A;  1:45  ? POLYPECTOMY  10/14/2019  ? Procedure: POLYPECTOMY;  Surgeon: Daneil Dolin, MD;  [facility];  Service: Endoscopy;;  ascending colon, descending colon  ? VIDEO BRONCHOSCOPY WITH ENDOBRONCHIAL ULTRASOUND N/A 04/05/2021  ? Procedure: VIDEO BRONCHOSCOPY WITH  ENDOBRONCHIAL ULTRASOUND;  Surgeon: Garner Nash, DO;  Location: [facility];  Service: Pulmonary;  Laterality: N/A;  ? ? ?REVIEW OF SYSTEMS:  A comprehensive review of systems was negative.  ? ?PHYSICAL EXAMINATION: General appearance: alert, cooperative, appears stated age, and no distress ?Head: Normocephalic, without obvious abnormality, atraumatic ?Neck: no adenopathy, no JVD, supple, symmetrical, trachea midline, and thyroid not enlarged, symmetric, no tenderness/mass/nodules ?Lymph nodes: Cervical, supraclavicular, and axillary nodes normal. ?Resp: clear to auscultation bilaterally ?Back: symmetric, no curvature. ROM normal. No CVA tenderness. ?Cardio: regular rate and rhythm, S1, S2 normal, no murmur, click, rub or gallop ?GI: soft, non-tender; bowel sounds normal; no masses,  no organomegaly ?Extremities: extremities normal, atraumatic, no cyanosis or edema ? ?ECOG PERFORMANCE STATUS: 1 - Symptomatic but completely ambulatory ? ?Blood pressure 134/82, pulse (!) 103, temperature (!) 97.2 ?F (36.2 ?C), temperature source Tympanic, resp. rate 18, weight 184 lb 6 oz (83.6 kg), SpO2 98 %. ? ?LABORATORY DATA: ?Lab Results  ?Component Value  Date  ? WBC 12.3 (H) 12/25/2021  ? HGB 13.6 12/25/2021  ? HCT 43.4 12/25/2021  ? MCV 95.2 12/25/2021  ? PLT 233 12/25/2021  ? ? ?  Chemistry   ?   ?Component Value Date/Time  ? NA 140 12/25/2021 1036  ? NA 144 01/10/2021 0849  ? K 3.8 12/25/2021 1036  ? CL 103 12/25/2021 1036  ? CO2 28 12/25/2021 1036  ? BUN 16 12/25/2021 1036  ? BUN 15 01/10/2021 0849  ? CREATININE 0.98 12/25/2021 1036  ?    ?Component Value Date/Time  ? CALCIUM 9.5 12/25/2021 1036  ? ALKPHOS 70 12/25/2021 1036  ? AST 20 12/25/2021 1036  ? ALT 31 12/25/2021 1036  ? BILITOT 0.3 12/25/2021 1036  ?  ? ? ? ?RADIOGRAPHIC STUDIES: ?CT Chest W Contrast ? ?Result Date: 11/28/2021 ?CLINICAL DATA:  Primary Cancer Type: Lung Imaging Indication: Assess response to therapy Interval therapy since last imaging? Yes  Initial Cancer Diagnosis Date: 04/05/2021; Established by: Biopsy-proven Detailed Pathology: Stage IV non-small cell lung cancer, adenocarcinoma. Primary Tumor location:  Right upper lobe. Brain metastasis. Surgeries: None.

## 2021-12-25 NOTE — Progress Notes (Signed)
Per Dr Julien Nordmann ,it is ok to treat pt today Keytruda and Alimta and heart rate of 103. ?

## 2022-01-03 ENCOUNTER — Other Ambulatory Visit (HOSPITAL_COMMUNITY): Payer: Self-pay

## 2022-01-10 ENCOUNTER — Other Ambulatory Visit (HOSPITAL_COMMUNITY): Payer: Self-pay

## 2022-01-10 ENCOUNTER — Encounter: Payer: Self-pay | Admitting: Family Medicine

## 2022-01-10 ENCOUNTER — Ambulatory Visit: Payer: 59 | Admitting: Family Medicine

## 2022-01-10 VITALS — BP 130/78 | HR 108 | Temp 98.1°F | Ht 73.0 in | Wt 184.8 lb

## 2022-01-10 DIAGNOSIS — G479 Sleep disorder, unspecified: Secondary | ICD-10-CM

## 2022-01-10 DIAGNOSIS — F3340 Major depressive disorder, recurrent, in remission, unspecified: Secondary | ICD-10-CM | POA: Diagnosis not present

## 2022-01-10 DIAGNOSIS — E1165 Type 2 diabetes mellitus with hyperglycemia: Secondary | ICD-10-CM | POA: Insufficient documentation

## 2022-01-10 DIAGNOSIS — C3491 Malignant neoplasm of unspecified part of right bronchus or lung: Secondary | ICD-10-CM | POA: Diagnosis not present

## 2022-01-10 DIAGNOSIS — R630 Anorexia: Secondary | ICD-10-CM

## 2022-01-10 DIAGNOSIS — I10 Essential (primary) hypertension: Secondary | ICD-10-CM

## 2022-01-10 DIAGNOSIS — K219 Gastro-esophageal reflux disease without esophagitis: Secondary | ICD-10-CM

## 2022-01-10 DIAGNOSIS — E782 Mixed hyperlipidemia: Secondary | ICD-10-CM

## 2022-01-10 DIAGNOSIS — E559 Vitamin D deficiency, unspecified: Secondary | ICD-10-CM | POA: Diagnosis not present

## 2022-01-10 LAB — BAYER DCA HB A1C WAIVED: HB A1C (BAYER DCA - WAIVED): 7.5 % — ABNORMAL HIGH (ref 4.8–5.6)

## 2022-01-10 MED ORDER — OZEMPIC (0.25 OR 0.5 MG/DOSE) 2 MG/3ML ~~LOC~~ SOPN
0.2500 mg | PEN_INJECTOR | SUBCUTANEOUS | 0 refills | Status: DC
  Filled 2022-01-10: qty 3, 56d supply, fill #0

## 2022-01-10 MED ORDER — ROSUVASTATIN CALCIUM 20 MG PO TABS
20.0000 mg | ORAL_TABLET | Freq: Every day | ORAL | 1 refills | Status: DC
  Filled 2022-01-10: qty 90, 90d supply, fill #0
  Filled 2022-04-14: qty 90, 90d supply, fill #1

## 2022-01-10 MED ORDER — PANTOPRAZOLE SODIUM 40 MG PO TBEC
40.0000 mg | DELAYED_RELEASE_TABLET | Freq: Every evening | ORAL | 1 refills | Status: DC
  Filled 2022-01-10 – 2022-02-01 (×2): qty 90, 90d supply, fill #0
  Filled 2022-05-01: qty 90, 90d supply, fill #1

## 2022-01-10 MED ORDER — MIRTAZAPINE 15 MG PO TABS
15.0000 mg | ORAL_TABLET | Freq: Every day | ORAL | 1 refills | Status: DC
  Filled 2022-01-10: qty 90, 90d supply, fill #0
  Filled 2022-05-01: qty 90, 90d supply, fill #1

## 2022-01-10 MED ORDER — VENLAFAXINE HCL ER 75 MG PO CP24
75.0000 mg | ORAL_CAPSULE | Freq: Every day | ORAL | 1 refills | Status: DC
  Filled 2022-01-10: qty 90, 90d supply, fill #0
  Filled 2022-04-03: qty 90, 90d supply, fill #1

## 2022-01-10 NOTE — Progress Notes (Signed)
? ?Assessment & Plan:  ?1. Uncontrolled type 2 diabetes mellitus with hyperglycemia, without long-term current use of insulin ([place]) ?Lab Results  ?Component Value Date  ? HGBA1C 7.5 (H) 01/10/2022  ? HGBA1C 9.7 (H) 10/19/2021  ? HGBA1C 6.2 (H) 07/12/2021  ?  ?- Diabetes is not at goal of A1c < 7, but is improving. ?- Medications: continue current medications, start Ozempic 0.25 mg weekly. ?- Home glucose monitoring: continue using freestyle libre 3. ?- Patient is currently taking a statin. Patient is taking an ACE-inhibitor/ARB.  ?- Instruction/counseling given: discussed foot care ? ?Diabetes Health Maintenance Due  ?Topic Date Due  ? HEMOGLOBIN A1C  07/12/2022  ? OPHTHALMOLOGY EXAM  11/15/2022  ? FOOT EXAM  01/11/2023  ?  ?Lab Results  ?Component Value Date  ? LABMICR 7.3 10/19/2021  ? LABMICR 4.8 03/09/2020  ? MICROALBUR neg 07/13/2014  ? ?- Lipid panel ?- Bayer DCA Hb A1c Waived ?- Vitamin B12 ?- Semaglutide,0.25 or 0.5MG /DOS, (OZEMPIC, 0.25 OR 0.5 MG/DOSE,) 2 MG/1.5ML SOPN; Inject 0.25 mg into the skin once a week.  Dispense: 1.5 mL; Refill: 0 ? ?2. Mixed hyperlipidemia ?Well controlled on current regimen.  ?- Lipid panel ?- rosuvastatin (CRESTOR) 20 MG tablet; Take 1 tablet (20 mg total) by mouth daily.  Dispense: 90 tablet; Refill: 1 ? ?3. Recurrent major depression in remission [facility]) ?Well controlled on current regimen.  ?- mirtazapine (REMERON) 15 MG tablet; Take 1 tablet (15 mg total) by mouth at bedtime.  Dispense: 90 tablet; Refill: 1 ?- venlafaxine XR (EFFEXOR-XR) 75 MG 24 hr capsule; Take 1 capsule (75 mg total) by mouth daily with breakfast.  Dispense: 90 capsule; Refill: 1 ? ?4. Difficulty sleeping ?Well controlled on current regimen.  ?- mirtazapine (REMERON) 15 MG tablet; Take 1 tablet (15 mg total) by mouth at bedtime.  Dispense: 90 tablet; Refill: 1 ? ?5. Decreased appetite ?Well controlled on current regimen.  ?- mirtazapine (REMERON) 15 MG tablet; Take 1 tablet (15 mg total) by mouth at  bedtime.  Dispense: 90 tablet; Refill: 1 ? ?6. Gastroesophageal reflux disease without esophagitis ?Well controlled on current regimen.  ?- pantoprazole (PROTONIX) 40 MG tablet; Take 1 tablet by mouth every evening.  Dispense: 90 tablet; Refill: 1 ? ?7. Essential hypertension ?Well controlled on current regimen.  ?- Lipid panel ? ?8. Adenocarcinoma of right lung, stage 4 ([place]) ?Managed by oncology. ? ?9. Vitamin D deficiency ?Well controlled on current regimen.  ?- VITAMIN D 25 Hydroxy (Vit-D Deficiency, Fractures) ? ? ?Return in about 4 weeks (around 02/07/2022) for DM (telephone). ? ?Hendricks Limes, MSN, APRN, FNP-C ?[facility] ? ?Subjective:  ? ? Patient ID: Bradley Goodman, male    DOB: [birth year], 66 y.o.   MRN: [id number] ? ?Patient Care Team: ?[name], FNP as PCP - General (Family Medicine) ?Rourk, Cristopher Estimable, MD as Consulting Physician (Gastroenterology) ?Celestia Khat, OD (Optometry) ?Valrie Hart, RN as Oncology Nurse Navigator (Oncology)  ? ?Chief Complaint:  ?Chief Complaint  ?Patient presents with  ? Medical Management of Chronic Issues  ? ? ?HPI: ?Bradley Goodman is a 66 y.o. male presenting on 01/10/2022 for Medical Management of Chronic Issues ? ?Diabetes: Patient presents for follow up of diabetes. Current symptoms include: hyperglycemia. Known diabetic complications: none. Medication compliance: yes - tolerating Metformin well. Current diet: in general, a "healthy" diet  . States he is trying to learn what foods increase his glucose and the freestyle Elenor Legato is helping a lot with this. Current  exercise: weightlifting. Home blood sugar records:  using the freestyle libre 3 . Is he  on ACE inhibitor or angiotensin II receptor blocker? Yes (Losartan). Is he on a statin? Yes (Rosuvastatin).  ? ? ? ?Depression: doing well with Effexor and Remeron. He has seen a great improvement in difficulty sleeping and appetite.  ? ? ?  01/10/2022  ?  8:11 AM 11/16/2021  ?  8:23 AM 10/19/2021  ?   8:22 AM  ?Depression screen PHQ 2/9  ?Decreased Interest 0 0 2  ?Down, Depressed, Hopeless 1 0 0  ?PHQ - 2 Score 1 0 2  ?Altered sleeping 0 1 0  ?Tired, decreased energy 1 0 2  ?Change in appetite 0 1 0  ?Feeling bad or failure about yourself  0 0 0  ?Trouble concentrating 0 0 1  ?Moving slowly or fidgety/restless 0 0 0  ?Suicidal thoughts 0 0 0  ?PHQ-9 Score 2 2 5   ?Difficult doing work/chores Not difficult at all Not difficult at all Not difficult at all  ? ? ?Hypertension: patient does not monitor his blood pressure at home. Blood pressure is elevated today, but this is not typical. ? ?Hyperlipidemia: taking rosuvastatin. ? ?Vitamin D Deficiency: taking OTC vitamin D supplement. ? ?Lung cancer with metastasis to the bone and brain. Diagnosed in June 2022. Managed by oncologist, Dr. Julien Nordmann.  ? ?New complaints: ?None ? ? ?Social history: ? ?Relevant past medical, surgical, family and social history reviewed and updated as indicated. Interim medical history since our last visit reviewed. ? ?Allergies and medications reviewed and updated. ? ?DATA REVIEWED: CHART IN EPIC ? ?ROS: Negative unless specifically indicated above in HPI.  ? ? ?Current Outpatient Medications:  ?  cholecalciferol (VITAMIN D) 1000 UNITS tablet, Take 1,000 Units by mouth every evening., Disp: , Rfl:  ?  Continuous Blood Gluc Sensor (FREESTYLE LIBRE 3 SENSOR) MISC, Place 1 sensor on the skin every 14 days. Use to check glucose continuously, Disp: 2 each, Rfl: 2 ?  dexamethasone (DECADRON) 1 MG tablet, Take 2 tablets by mouth daily., Disp: 180 tablet, Rfl: 1 ?  docusate sodium (COLACE) 100 MG capsule, Take 100 mg by mouth daily., Disp: , Rfl:  ?  folic acid (FOLVITE) 1 MG tablet, Take 1 tablet by mouth daily., Disp: 30 tablet, Rfl: 4 ?  Krill Oil 300 MG CAPS, Take 300 mg by mouth every evening., Disp: , Rfl:  ?  levothyroxine (SYNTHROID) 25 MCG tablet, Take 1 tablet by mouth daily before breakfast., Disp: 90 tablet, Rfl: 1 ?  loratadine  (CLARITIN) 10 MG tablet, Take 10 mg by mouth every evening. , Disp: , Rfl:  ?  losartan (COZAAR) 50 MG tablet, Take 1 and 1/2 tablets (75 mg total) by mouth daily., Disp: 135 tablet, Rfl: 1 ?  magic mouthwash (nystatin, diphenhydrAMINE, alum & mag hydroxide) suspension mixture, Swish and spit 5 mls up to 4 times daily as needed. (Patient not taking: Reported on 12/25/2021), Disp: 240 mL, Rfl: 1 ?  metFORMIN (GLUCOPHAGE XR) 500 MG 24 hr tablet, Take 2 tablets by mouth 2  times daily with a meal., Disp: 360 tablet, Rfl: 1 ?  mirtazapine (REMERON) 15 MG tablet, Take 1 tablet (15 mg total) by mouth at bedtime., Disp: 90 tablet, Rfl: 1 ?  Multiple Vitamins-Minerals (MULTIVITAMIN GUMMIES ADULT PO), Take 2 each by mouth daily., Disp: , Rfl:  ?  pantoprazole (PROTONIX) 40 MG tablet, Take 1 tablet by mouth every evening., Disp: 30 tablet, Rfl: 1 ?  prochlorperazine (COMPAZINE) 10 MG tablet, Take 1 tablet (10 mg total) by mouth every 6 (six) hours as needed for nausea or vomiting. (Patient not taking: Reported on 12/25/2021), Disp: 30 tablet, Rfl: 0 ?  rosuvastatin (CRESTOR) 20 MG tablet, Take 1 tablet (20 mg total) by mouth daily., Disp: 90 tablet, Rfl: 1 ?  SYRINGE-NEEDLE, DISP, 3 ML 21G X 1-1/2" 3 ML MISC, Use to inject testosterone every week, Disp: 100 each, Rfl: 2 ?  testosterone cypionate (DEPOTESTOTERONE CYPIONATE) 100 MG/ML injection, Inject 0.5 MLs into the muscle every 7 days, Disp: 10 mL, Rfl: 0 ?  venlafaxine XR (EFFEXOR-XR) 75 MG 24 hr capsule, Take 1 capsule (75 mg total) by mouth daily with breakfast., Disp: 90 capsule, Rfl: 1  ? ?No Known Allergies ?Past Medical History:  ?Diagnosis Date  ? Cervical spondylolysis   ? Essential hypertension   ? GERD (gastroesophageal reflux disease)   ? History of kidney stones   ? History of migraine   ? Hyperlipidemia   ? Hypertension   ? PONV (postoperative nausea and vomiting)   ? Type 2 diabetes mellitus ([place])   ?  ?Past Surgical History:  ?Procedure Laterality Date  ?  Bilateral inguinal hernia repair    ? BRONCHIAL NEEDLE ASPIRATION BIOPSY  04/05/2021  ? Procedure: BRONCHIAL NEEDLE ASPIRATION BIOPSIES;  Surgeon: Garner Nash, DO;  Location: [facility];  Service: Pulmonary;;  ? C

## 2022-01-10 NOTE — Progress Notes (Signed)
[facility] ?OFFICE PROGRESS NOTE ? ?[name], FNP ?[place] [address] ? ?DIAGNOSIS: Stage IV (T1b, N3, M1C) non-small cell lung cancer, favoring adenocarcinoma presented with right upper lobe lung nodule in addition to right hilar, subcarinal and bilateral mediastinal as well as supraclavicular lymphadenopathy in addition to bone and brain metastasis diagnosed in June 2022. ?  ?  ?PD-L1 expression 80%.   ?  ?Molecular Studies:  ?Biomarker Findings ?Microsatellite status - MS-Stable ?Tumor Mutational Burden - 6 Muts/Mb ?Genomic Findings ?For a complete list of the genes assayed, please refer to the Appendix. ?KRAS G12C, amplification ?ATM [phone]* ?CCND1 amplification - ?equivocal? ?HGF amplification - ?equivocal? ?MYC amplification - ?equivocal? ?FGF19 amplification - ?equivocal? ?FGF3 amplification - ?equivocal? ?FGF4 amplification - ?equivocal? ?NFKBIA amplification ?NKX2-1 amplification ?RAD21 amplification - ?equivocal? ?[id number]*26 ?TERT promoter -124C>T ?TP53 rearrangement ?exon 9 ?7 Disease relevant genes with no reportable ?alterations: ALK, BRAF, EGFR, ERBB2, MET, RET, ROS1 ? ?PRIOR THERAPY: SRS to the metastatic brain lesions under the care of Dr. MTammi Klippel  Last treatment on 05/04/2021. ? ?CURRENT THERAPY: Palliative systemic chemotherapy with carboplatin for an AUC 5, Alimta 500 mg/m2 and, Keytruda 200 mg IV every 3 weeks.  First dose expected on 05/08/2021.  Status post 12 cycles.  Starting from cycle #5 he is on maintenance treatment with Alimta and Keytruda every 3 weeks. ? ?INTERVAL HISTORY: ?Bradley Goodman 66y.o. male returns to the clinic today for a follow-up visit accompanied by his wife.  The patient is feeling fairly well today without any concerning complaints. They are going to go to the beach after his appointment today. He denies any fever, chills, night sweats, or unexplained weight loss.  He reports his breathing is "fine".  He denies any  significant dyspnea on exertion out of the ordinary.  Denies any cough, chest pain, or hemoptysis.  Denies any nausea, vomiting, diarrhea, or constipation.  Denies any unusual headache or visual changes. He is scheduled for a repeat brain MRI on 02/09/22 for his history of metastatic disease to the brain.  The patient is following with his PCP regarding diabetes management. He recently started on Ozempic. He is here today for evaluation and to review his scan results before starting cycle #13. ? ?MEDICAL HISTORY: ?Past Medical History:  ?Diagnosis Date  ? Cervical spondylolysis   ? Essential hypertension   ? GERD (gastroesophageal reflux disease)   ? History of kidney stones   ? History of migraine   ? Hyperlipidemia   ? Hypertension   ? PONV (postoperative nausea and vomiting)   ? Type 2 diabetes mellitus ([place]   ? ? ?ALLERGIES:  has No Known Allergies. ? ?MEDICATIONS:  ?Current Outpatient Medications  ?Medication Sig Dispense Refill  ? cholecalciferol (VITAMIN D) 1000 UNITS tablet Take 1,000 Units by mouth every evening.    ? Continuous Blood Gluc Sensor (FREESTYLE LIBRE 3 SENSOR) MISC Place 1 sensor on the skin every 14 days. Use to check glucose continuously 2 each 2  ? dexamethasone (DECADRON) 1 MG tablet Take 2 tablets by mouth daily. 180 tablet 1  ? docusate sodium (COLACE) 100 MG capsule Take 100 mg by mouth daily.    ? folic acid (FOLVITE) 1 MG tablet Take 1 tablet by mouth daily. 30 tablet 4  ? Krill Oil 300 MG CAPS Take 300 mg by mouth every evening.    ? levothyroxine (SYNTHROID) 25 MCG tablet Take 1 tablet by mouth daily before breakfast. 90 tablet 1  ?  loratadine (CLARITIN) 10 MG tablet Take 10 mg by mouth every evening.     ? losartan (COZAAR) 50 MG tablet Take 1 and 1/2 tablets (75 mg total) by mouth daily. 135 tablet 1  ? magic mouthwash (nystatin, diphenhydrAMINE, alum & mag hydroxide) suspension mixture Swish and spit 5 mls up to 4 times daily as needed. 240 mL 1  ? metFORMIN (GLUCOPHAGE XR) 500 MG  24 hr tablet Take 2 tablets by mouth 2  times daily with a meal. 360 tablet 1  ? mirtazapine (REMERON) 15 MG tablet Take 1 tablet by mouth at bedtime. 90 tablet 1  ? Multiple Vitamins-Minerals (MULTIVITAMIN GUMMIES ADULT PO) Take 2 each by mouth daily.    ? pantoprazole (PROTONIX) 40 MG tablet Take 1 tablet by mouth every evening. 90 tablet 1  ? prochlorperazine (COMPAZINE) 10 MG tablet Take 1 tablet (10 mg total) by mouth every 6 (six) hours as needed for nausea or vomiting. 30 tablet 0  ? rosuvastatin (CRESTOR) 20 MG tablet Take 1 tablet by mouth daily. 90 tablet 1  ? Semaglutide,0.25 or 0.5MG/DOS, (OZEMPIC, 0.25 OR 0.5 MG/DOSE,) 2 MG/3ML SOPN Inject 0.25 mg into the skin once a week. 3 mL 0  ? SYRINGE-NEEDLE, DISP, 3 ML 21G X 1-1/2" 3 ML MISC Use to inject testosterone every week 100 each 2  ? testosterone cypionate (DEPOTESTOTERONE CYPIONATE) 100 MG/ML injection Inject 0.5 MLs into the muscle every 7 days 10 mL 0  ? venlafaxine XR (EFFEXOR-XR) 75 MG 24 hr capsule Take 1 capsule by mouth daily with breakfast. 90 capsule 1  ? ?No current facility-administered medications for this visit.  ? ? ?SURGICAL HISTORY:  ?Past Surgical History:  ?Procedure Laterality Date  ? Bilateral inguinal hernia repair    ? BRONCHIAL NEEDLE ASPIRATION BIOPSY  04/05/2021  ? Procedure: BRONCHIAL NEEDLE ASPIRATION BIOPSIES;  Surgeon: Garner Nash, DO;  Location: [facility];  Service: Pulmonary;;  ? COLONOSCOPY  01/23/2012  ? Procedure: COLONOSCOPY;  Surgeon: Daneil Dolin, MD;  [facility];  Service: Endoscopy;  Laterality: N/A;  9:30 AM  ? COLONOSCOPY N/A 10/11/2015  ? Procedure: COLONOSCOPY;  Surgeon: Daneil Dolin, MD;  [facility];  Service: Endoscopy;  Laterality: N/A;  830  ? COLONOSCOPY N/A 10/14/2019  ? Procedure: COLONOSCOPY;  Surgeon: Daneil Dolin, MD;  [facility];  Service: Endoscopy;  Laterality: N/A;  1:45  ? POLYPECTOMY  10/14/2019  ? Procedure: POLYPECTOMY;  Surgeon: Daneil Dolin,  MD;  [facility];  Service: Endoscopy;;  ascending colon, descending colon  ? VIDEO BRONCHOSCOPY WITH ENDOBRONCHIAL ULTRASOUND N/A 04/05/2021  ? Procedure: VIDEO BRONCHOSCOPY WITH ENDOBRONCHIAL ULTRASOUND;  Surgeon: Garner Nash, DO;  Location: [facility];  Service: Pulmonary;  Laterality: N/A;  ? ? ?REVIEW OF SYSTEMS:   ?Review of Systems  ?Constitutional: Negative for appetite change, chills, fatigue, fever and unexpected weight change.  ?HENT: Negative for mouth sores, nosebleeds, sore throat and trouble swallowing.   ?Eyes: Negative for eye problems and icterus.  ?Respiratory: Negative for cough, hemoptysis, shortness of breath and wheezing.   ?Cardiovascular: Negative for chest pain and leg swelling.  ?Gastrointestinal: Negative for abdominal pain, constipation, diarrhea, nausea and vomiting.  ?Genitourinary: Negative for bladder incontinence, difficulty urinating, dysuria, frequency and hematuria.   ?Musculoskeletal: Negative for back pain, gait problem, neck pain and neck stiffness.  ?Skin: Negative for itching and rash.  ?Neurological: Negative for dizziness, extremity weakness, gait problem, headaches, light-headedness and seizures.  ?  Hematological: Negative for adenopathy. Does not bruise/bleed easily.  ?Psychiatric/Behavioral: Negative for confusion, depression and sleep disturbance. The patient is not nervous/anxious.   ? ? ?PHYSICAL EXAMINATION:  ?Blood pressure 133/83, pulse (!) 102, temperature (!) 97.5 ?F (36.4 ?C), temperature source Tympanic, resp. rate 18, height 6' 1" (1.854 m), weight 186 lb 3 oz (84.5 kg), SpO2 100 %. ? ?ECOG PERFORMANCE STATUS: 1 ? ?Physical Exam  ?Constitutional: Oriented to person, place, and time and well-developed, well-nourished, and in no distress. HENT:  ?Head: Normocephalic and atraumatic.  ?Mouth/Throat: Oropharynx is clear and moist. No oropharyngeal exudate.  ?Eyes: Conjunctivae are normal. Right eye exhibits no discharge. Left eye exhibits no  discharge. No scleral icterus.  ?Neck: Normal range of motion. Neck supple.  ?Cardiovascular: Normal rate, regular rhythm, normal heart sounds and intact distal pulses.   ?Pulmonary/Chest: Effort normal and breath s

## 2022-01-11 ENCOUNTER — Other Ambulatory Visit: Payer: Self-pay | Admitting: Radiation Therapy

## 2022-01-11 LAB — LIPID PANEL
Chol/HDL Ratio: 2.5 ratio (ref 0.0–5.0)
Cholesterol, Total: 104 mg/dL (ref 100–199)
HDL: 41 mg/dL (ref >39)
LDL Chol Calc (NIH): 26 mg/dL (ref 0–99)
Triglycerides: 241 mg/dL — ABNORMAL HIGH (ref 0–149)
VLDL Cholesterol Cal: 37 mg/dL (ref 5–40)

## 2022-01-11 LAB — VITAMIN B12: Vitamin B-12: 557 pg/mL (ref 232–1245)

## 2022-01-11 LAB — VITAMIN D 25 HYDROXY (VIT D DEFICIENCY, FRACTURES): Vit D, 25-Hydroxy: 58.9 ng/mL (ref 30.0–100.0)

## 2022-01-12 ENCOUNTER — Other Ambulatory Visit (HOSPITAL_COMMUNITY): Payer: Self-pay

## 2022-01-12 ENCOUNTER — Other Ambulatory Visit: Payer: Self-pay | Admitting: "Endocrinology

## 2022-01-12 ENCOUNTER — Encounter: Payer: Self-pay | Admitting: Family Medicine

## 2022-01-13 ENCOUNTER — Other Ambulatory Visit (HOSPITAL_COMMUNITY): Payer: Self-pay

## 2022-01-15 ENCOUNTER — Inpatient Hospital Stay: Payer: 59 | Attending: Physician Assistant

## 2022-01-15 ENCOUNTER — Inpatient Hospital Stay: Payer: 59 | Admitting: Physician Assistant

## 2022-01-15 ENCOUNTER — Inpatient Hospital Stay: Payer: 59

## 2022-01-15 ENCOUNTER — Ambulatory Visit: Payer: 59 | Admitting: Physician Assistant

## 2022-01-15 ENCOUNTER — Ambulatory Visit: Payer: 59

## 2022-01-15 ENCOUNTER — Other Ambulatory Visit (HOSPITAL_COMMUNITY): Payer: Self-pay

## 2022-01-15 ENCOUNTER — Other Ambulatory Visit: Payer: Self-pay

## 2022-01-15 ENCOUNTER — Other Ambulatory Visit: Payer: 59

## 2022-01-15 VITALS — BP 133/83 | HR 102 | Temp 97.5°F | Resp 18 | Ht 73.0 in | Wt 186.2 lb

## 2022-01-15 VITALS — HR 99

## 2022-01-15 DIAGNOSIS — C7931 Secondary malignant neoplasm of brain: Secondary | ICD-10-CM | POA: Insufficient documentation

## 2022-01-15 DIAGNOSIS — Z5111 Encounter for antineoplastic chemotherapy: Secondary | ICD-10-CM

## 2022-01-15 DIAGNOSIS — C3411 Malignant neoplasm of upper lobe, right bronchus or lung: Secondary | ICD-10-CM | POA: Insufficient documentation

## 2022-01-15 DIAGNOSIS — E119 Type 2 diabetes mellitus without complications: Secondary | ICD-10-CM | POA: Diagnosis not present

## 2022-01-15 DIAGNOSIS — F17210 Nicotine dependence, cigarettes, uncomplicated: Secondary | ICD-10-CM | POA: Insufficient documentation

## 2022-01-15 DIAGNOSIS — Z79899 Other long term (current) drug therapy: Secondary | ICD-10-CM | POA: Insufficient documentation

## 2022-01-15 DIAGNOSIS — C3491 Malignant neoplasm of unspecified part of right bronchus or lung: Secondary | ICD-10-CM | POA: Diagnosis not present

## 2022-01-15 DIAGNOSIS — Z5112 Encounter for antineoplastic immunotherapy: Secondary | ICD-10-CM

## 2022-01-15 DIAGNOSIS — Z7984 Long term (current) use of oral hypoglycemic drugs: Secondary | ICD-10-CM | POA: Diagnosis not present

## 2022-01-15 DIAGNOSIS — Z923 Personal history of irradiation: Secondary | ICD-10-CM | POA: Diagnosis not present

## 2022-01-15 DIAGNOSIS — C7951 Secondary malignant neoplasm of bone: Secondary | ICD-10-CM | POA: Insufficient documentation

## 2022-01-15 DIAGNOSIS — E538 Deficiency of other specified B group vitamins: Secondary | ICD-10-CM | POA: Insufficient documentation

## 2022-01-15 LAB — CMP (CANCER CENTER ONLY)
ALT: 24 U/L (ref 0–44)
AST: 19 U/L (ref 15–41)
Albumin: 3.8 g/dL (ref 3.5–5.0)
Alkaline Phosphatase: 63 U/L (ref 38–126)
Anion gap: 8 (ref 5–15)
BUN: 13 mg/dL (ref 8–23)
CO2: 27 mmol/L (ref 22–32)
Calcium: 9 mg/dL (ref 8.9–10.3)
Chloride: 106 mmol/L (ref 98–111)
Creatinine: 1.13 mg/dL (ref 0.61–1.24)
GFR, Estimated: >60 mL/min (ref >60)
Glucose, Bld: 215 mg/dL — ABNORMAL HIGH (ref 70–99)
Potassium: 3.6 mmol/L (ref 3.5–5.1)
Sodium: 141 mmol/L (ref 135–145)
Total Bilirubin: 0.2 mg/dL — ABNORMAL LOW (ref 0.3–1.2)
Total Protein: 6.3 g/dL — ABNORMAL LOW (ref 6.5–8.1)

## 2022-01-15 LAB — CBC WITH DIFFERENTIAL (CANCER CENTER ONLY)
Abs Immature Granulocytes: 0.08 10*3/uL — ABNORMAL HIGH (ref 0.00–0.07)
Basophils Absolute: 0.1 10*3/uL (ref 0.0–0.1)
Basophils Relative: 1 %
Eosinophils Absolute: 0.1 10*3/uL (ref 0.0–0.5)
Eosinophils Relative: 1 %
HCT: 39.5 % (ref 39.0–52.0)
Hemoglobin: 12.8 g/dL — ABNORMAL LOW (ref 13.0–17.0)
Immature Granulocytes: 1 %
Lymphocytes Relative: 21 %
Lymphs Abs: 2.1 10*3/uL (ref 0.7–4.0)
MCH: 30.5 pg (ref 26.0–34.0)
MCHC: 32.4 g/dL (ref 30.0–36.0)
MCV: 94 fL (ref 80.0–100.0)
Monocytes Absolute: 0.6 10*3/uL (ref 0.1–1.0)
Monocytes Relative: 6 %
Neutro Abs: 7.4 10*3/uL (ref 1.7–7.7)
Neutrophils Relative %: 70 %
Platelet Count: 226 10*3/uL (ref 150–400)
RBC: 4.2 MIL/uL — ABNORMAL LOW (ref 4.22–5.81)
RDW: 16.6 % — ABNORMAL HIGH (ref 11.5–15.5)
WBC Count: 10.4 10*3/uL (ref 4.0–10.5)
nRBC: 0 % (ref 0.0–0.2)

## 2022-01-15 LAB — TSH: TSH: 1.241 u[IU]/mL (ref 0.320–4.118)

## 2022-01-15 MED ORDER — PROCHLORPERAZINE MALEATE 10 MG PO TABS
10.0000 mg | ORAL_TABLET | Freq: Once | ORAL | Status: AC
  Administered 2022-01-15: 10 mg via ORAL
  Filled 2022-01-15: qty 1

## 2022-01-15 MED ORDER — SODIUM CHLORIDE 0.9 % IV SOLN: Freq: Once | INTRAVENOUS | Status: AC

## 2022-01-15 MED ORDER — SODIUM CHLORIDE 0.9 % IV SOLN
200.0000 mg | Freq: Once | INTRAVENOUS | Status: AC
  Administered 2022-01-15: 200 mg via INTRAVENOUS
  Filled 2022-01-15: qty 8

## 2022-01-15 MED ORDER — SODIUM CHLORIDE 0.9 % IV SOLN: Freq: Once | INTRAVENOUS | Status: DC

## 2022-01-15 MED ORDER — SODIUM CHLORIDE 0.9 % IV SOLN
500.0000 mg/m2 | Freq: Once | INTRAVENOUS | Status: AC
  Administered 2022-01-15: 1000 mg via INTRAVENOUS
  Filled 2022-01-15: qty 40

## 2022-01-15 MED ORDER — DEXAMETHASONE 1 MG PO TABS
2.0000 mg | ORAL_TABLET | Freq: Every day | ORAL | 1 refills | Status: DC
  Filled 2022-01-15: qty 14, 7d supply, fill #0
  Filled 2022-01-15: qty 166, 83d supply, fill #0
  Filled 2022-04-13: qty 180, 90d supply, fill #1

## 2022-01-15 NOTE — Patient Instructions (Signed)
[facility]  Discharge Instructions: ?Thank you for choosing [facility] to provide your oncology and hematology care.  ? ?If you have a lab appointment with the [facility], please go directly to the [facility] and check in at the registration area. ?  ?Wear comfortable clothing and clothing appropriate for easy access to any Portacath or PICC line.  ? ?We strive to give you quality time with your provider. You may need to reschedule your appointment if you arrive late (15 or more minutes).  Arriving late affects you and other patients whose appointments are after yours.  Also, if you miss three or more appointments without notifying the office, you may be dismissed from the clinic at the provider?s discretion.    ?  ?For prescription refill requests, have your pharmacy contact our office and allow 72 hours for refills to be completed.   ? ?Today you received the following chemotherapy and/or immunotherapy agents Keytruda and Alimta    ?  ?To help prevent nausea and vomiting after your treatment, we encourage you to take your nausea medication as directed. ? ?BELOW ARE SYMPTOMS THAT SHOULD BE REPORTED IMMEDIATELY: ?*FEVER GREATER THAN 100.4 F (38 ?C) OR HIGHER ?*CHILLS OR SWEATING ?*NAUSEA AND VOMITING THAT IS NOT CONTROLLED WITH YOUR NAUSEA MEDICATION ?*UNUSUAL SHORTNESS OF BREATH ?*UNUSUAL BRUISING OR BLEEDING ?*URINARY PROBLEMS (pain or burning when urinating, or frequent urination) ?*BOWEL PROBLEMS (unusual diarrhea, constipation, pain near the anus) ?TENDERNESS IN MOUTH AND THROAT WITH OR WITHOUT PRESENCE OF ULCERS (sore throat, sores in mouth, or a toothache) ?UNUSUAL RASH, SWELLING OR PAIN  ?UNUSUAL VAGINAL DISCHARGE OR ITCHING  ? ?Items with * indicate a potential emergency and should be followed up as soon as possible or go to the Emergency Department if any problems should occur. ? ?Please show the CHEMOTHERAPY ALERT CARD or IMMUNOTHERAPY ALERT CARD at  check-in to the Emergency Department and triage nurse. ? ?Should you have questions after your visit or need to cancel or reschedule your appointment, please contact [facility]  Dept: [phone]  and follow the prompts.  Office hours are 8:00 a.m. to 4:30 p.m. Monday - Friday. Please note that voicemails left after 4:00 p.m. may not be returned until the following business day.  We are closed weekends and major holidays. You have access to a nurse at all times for urgent questions. Please call the main number to the clinic Dept: [phone] and follow the prompts. ? ? ?For any non-urgent questions, you may also contact your provider using MyChart. We now offer e-Visits for anyone 36 and older to request care online for non-urgent symptoms. For details visit [email or web address]. ?  ?Also download the MyChart app! Go to the app store, search "MyChart", open the app, select [facility], and log in with your MyChart username and password. ? ?Due to Covid, a mask is required upon entering the hospital/clinic. If you do not have a mask, one will be given to you upon arrival. For doctor visits, patients may have 1 support person aged 40 or older with them. For treatment visits, patients cannot have anyone with them due to current Covid guidelines and our immunocompromised population.  ? ?Thank you for choosing [facility] to provide your care.   ?Should you have questions after your visit to the [facility]), please contact this office at [phone] between 8:30 AM and 4:30 PM.  ?Voice mails left after 4:00 PM  may not be returned until the following business day.  ?Calls received after 4:30 PM will be answered by an off-site Nurse Triage Line. ?   ?Prescription Refills:  Please have your pharmacy contact us directly for most prescription requests.  Contact the office directly for refills of narcotics (pain medications). Allow 48-72 hours for  refills. ? ?Appointments: Please contact the [facility] scheduling department [phone] for questions regarding [facility] appointment scheduling.  Contact the schedulers with any scheduling changes so that your appointment can be rescheduled in a timely manner.  ? ?Central Scheduling for [facility] [phone] - Call to schedule procedures such as PET scans, CT scans, MRI, Ultrasound, etc. ? ?To afford each patient quality time with our providers, please arrive 30 minutes before your scheduled appointment time.  If you arrive late for your appointment, you may be asked to reschedule.  We strive to give you quality time with our providers, and arriving late affects you and other patients whose appointments are after yours. If you are a no show for multiple scheduled visits, you may be dismissed from the clinic at the providers discretion.   ?  ?Resources: ?[place] [employer] [phone] for additional information on assistance programs or assistance connecting with community support programs   ?[facility]  [phone]: Information regarding food stamps, Medicaid, and utility assistance ?[place] [employer] [place] [employer]'s shared-ride transportation service for eligible riders who have a disability that prevents them from riding the fixed route bus.   ?Medicare [place] 2265501971 Helps people with Medicare understand their rights and benefits, navigate the Medicare system, and secure the quality healthcare they deserve ?[facility] [phone] Assists patients locate various types of support and financial assistance ?Cancer Care: 1-800-813-HOPE [phone]) Provides financial assistance, online support groups, medication/co-pay assistance.   ?Transportation Assistance for appointments at [facility]: [facility] [phone] ? ? ?   ?

## 2022-01-16 ENCOUNTER — Other Ambulatory Visit (HOSPITAL_COMMUNITY): Payer: Self-pay

## 2022-01-17 ENCOUNTER — Other Ambulatory Visit: Payer: Self-pay | Admitting: Family Medicine

## 2022-01-17 DIAGNOSIS — E1165 Type 2 diabetes mellitus with hyperglycemia: Secondary | ICD-10-CM

## 2022-01-18 ENCOUNTER — Encounter: Payer: Self-pay | Admitting: Family Medicine

## 2022-01-18 ENCOUNTER — Other Ambulatory Visit (HOSPITAL_COMMUNITY): Payer: Self-pay

## 2022-01-18 MED ORDER — FREESTYLE LIBRE 3 SENSOR MISC
1.0000 | 2 refills | Status: DC
  Filled 2022-01-18: qty 2, 28d supply, fill #0
  Filled 2022-02-10: qty 2, 28d supply, fill #1
  Filled 2022-03-07: qty 2, 28d supply, fill #2

## 2022-01-19 ENCOUNTER — Other Ambulatory Visit (HOSPITAL_COMMUNITY): Payer: Self-pay

## 2022-01-22 ENCOUNTER — Other Ambulatory Visit (HOSPITAL_COMMUNITY)
Admission: RE | Admit: 2022-01-22 | Discharge: 2022-01-22 | Disposition: A | Discharge status: Home / Self Care | Payer: 59 | Source: Ambulatory Visit | Attending: "Endocrinology | Admitting: "Endocrinology

## 2022-01-22 DIAGNOSIS — E230 Hypopituitarism: Secondary | ICD-10-CM | POA: Insufficient documentation

## 2022-01-22 LAB — T4, FREE: Free T4: 0.58 ng/dL — ABNORMAL LOW (ref 0.61–1.12)

## 2022-01-22 LAB — TSH: TSH: 1.858 u[IU]/mL (ref 0.350–4.500)

## 2022-01-23 LAB — TESTOSTERONE: Testosterone: 234 ng/dL — ABNORMAL LOW (ref 264–916)

## 2022-01-23 LAB — SEX HORMONE BINDING GLOBULIN: Sex Hormone Binding: 16.7 nmol/L — ABNORMAL LOW (ref 19.3–76.4)

## 2022-01-23 LAB — TESTOSTERONE, FREE: Testosterone, Free: 4.7 pg/mL — ABNORMAL LOW (ref 6.6–18.1)

## 2022-01-23 LAB — T3, FREE: T3, Free: 2 pg/mL (ref 2.0–4.4)

## 2022-01-26 LAB — TESTOSTERONE, % FREE: Testosterone-% Free: 1.9 % — ABNORMAL HIGH (ref 0.2–0.7)

## 2022-01-29 ENCOUNTER — Encounter: Payer: Self-pay | Admitting: Internal Medicine

## 2022-01-29 ENCOUNTER — Ambulatory Visit: Payer: 59 | Admitting: "Endocrinology

## 2022-01-29 ENCOUNTER — Other Ambulatory Visit (HOSPITAL_COMMUNITY): Payer: Self-pay

## 2022-01-29 ENCOUNTER — Encounter: Payer: Self-pay | Admitting: "Endocrinology

## 2022-01-29 VITALS — BP 118/80 | HR 100 | Ht 73.0 in | Wt 185.0 lb

## 2022-01-29 DIAGNOSIS — E291 Testicular hypofunction: Secondary | ICD-10-CM | POA: Diagnosis not present

## 2022-01-29 DIAGNOSIS — E2740 Unspecified adrenocortical insufficiency: Secondary | ICD-10-CM

## 2022-01-29 DIAGNOSIS — E230 Hypopituitarism: Secondary | ICD-10-CM

## 2022-01-29 DIAGNOSIS — E039 Hypothyroidism, unspecified: Secondary | ICD-10-CM | POA: Diagnosis not present

## 2022-01-29 MED ORDER — TESTOSTERONE 20.25 MG/ACT (1.62%) TD GEL: TRANSDERMAL | 0 refills | Status: DC

## 2022-01-29 MED ORDER — LEVOTHYROXINE SODIUM 50 MCG PO TABS
50.0000 ug | ORAL_TABLET | Freq: Every day | ORAL | 1 refills | Status: DC
  Filled 2022-01-29: qty 90, 90d supply, fill #0
  Filled 2022-05-12: qty 90, 90d supply, fill #1

## 2022-01-29 MED ORDER — TESTOSTERONE 20.25 MG/ACT (1.62%) TD GEL
TRANSDERMAL | 0 refills | Status: DC
Start: 2022-01-29 — End: 2022-06-14
  Filled 2022-01-29 – 2022-06-01 (×3): qty 75, 30d supply, fill #0

## 2022-01-29 NOTE — Progress Notes (Signed)
? ?     ?Endocrinology follow-up note ? ?                                           01/29/2022, 3:33 PM ? ? ?Subjective:  ? ? Patient ID: Bradley Goodman, male    DOB: [birth year], PCP [name], FNP ? ? ?Past Medical History:  ?Diagnosis Date  ? Cervical spondylolysis   ? Essential hypertension   ? GERD (gastroesophageal reflux disease)   ? History of kidney stones   ? History of migraine   ? Hyperlipidemia   ? Hypertension   ? PONV (postoperative nausea and vomiting)   ? Type 2 diabetes mellitus ([place])   ? ?Past Surgical History:  ?Procedure Laterality Date  ? Bilateral inguinal hernia repair    ? BRONCHIAL NEEDLE ASPIRATION BIOPSY  04/05/2021  ? Procedure: BRONCHIAL NEEDLE ASPIRATION BIOPSIES;  Surgeon: Garner Nash, DO;  Location: [facility];  Service: Pulmonary;;  ? COLONOSCOPY  01/23/2012  ? Procedure: COLONOSCOPY;  Surgeon: Daneil Dolin, MD;  [facility];  Service: Endoscopy;  Laterality: N/A;  9:30 AM  ? COLONOSCOPY N/A 10/11/2015  ? Procedure: COLONOSCOPY;  Surgeon: Daneil Dolin, MD;  [facility];  Service: Endoscopy;  Laterality: N/A;  830  ? COLONOSCOPY N/A 10/14/2019  ? Procedure: COLONOSCOPY;  Surgeon: Daneil Dolin, MD;  [facility];  Service: Endoscopy;  Laterality: N/A;  1:45  ? POLYPECTOMY  10/14/2019  ? Procedure: POLYPECTOMY;  Surgeon: Daneil Dolin, MD;  [facility];  Service: Endoscopy;;  ascending colon, descending colon  ? VIDEO BRONCHOSCOPY WITH ENDOBRONCHIAL ULTRASOUND N/A 04/05/2021  ? Procedure: VIDEO BRONCHOSCOPY WITH ENDOBRONCHIAL ULTRASOUND;  Surgeon: Garner Nash, DO;  Location: [facility];  Service: Pulmonary;  Laterality: N/A;  ? ?Social History  ? ?Socioeconomic History  ? Marital status: Married  ?  Spouse name: Not on file  ? Number of children: Not on file  ? Years of education: Not on file  ? Highest education level: Not on file  ?Occupational History  ? Not on file  ?Tobacco Use  ? Smoking status: Every Day  ?   Packs/day: 0.50  ?  Years: 30.00  ?  Pack years: 15.00  ?  Types: Cigarettes  ?  Start date: 10/30/1973  ? Smokeless tobacco: Never  ?Vaping Use  ? Vaping Use: Never used  ?Substance and Sexual Activity  ? Alcohol use: Yes  ?  Alcohol/week: 0.0 standard drinks  ?  Comment: One drink every 6 months.  ? Drug use: No  ? Sexual activity: Yes  ?Other Topics Concern  ? Not on file  ?Social History Narrative  ? Not on file  ? ?Social Determinants of Health  ? ?Financial Resource Strain: Not on file  ?Food Insecurity: Not on file  ?Transportation Needs: Not on file  ?Physical Activity: Not on file  ?Stress: Not on file  ?Social Connections: Not on file  ? ?Family History  ?Problem Relation Age of Onset  ? Hypertension Mother   ? Diabetes Mother   ? Heart attack Mother   ? Hypertension Father   ? Heart attack Father   ? Heart attack Brother   ? Colon cancer Neg Hx   ? ?Outpatient Encounter Medications as of 01/29/2022  ?Medication Sig  ? [DISCONTINUED] Testosterone 20.25 MG/ACT (1.62%)  GEL Apply 20.25mg /ACT on each shoulder every morning  ? cholecalciferol (VITAMIN D) 1000 UNITS tablet Take 1,000 Units by mouth every evening.  ? Continuous Blood Gluc Sensor (FREESTYLE LIBRE 3 SENSOR) MISC Place 1 sensor on the skin every 14 days. Use to check glucose continuously  ? dexamethasone (DECADRON) 1 MG tablet Take 2 tablets by mouth daily.  ? docusate sodium (COLACE) 100 MG capsule Take 100 mg by mouth daily.  ? folic acid (FOLVITE) 1 MG tablet Take 1 tablet by mouth daily.  ? Krill Oil 300 MG CAPS Take 300 mg by mouth every evening.  ? levothyroxine (SYNTHROID) 50 MCG tablet Take 1 tablet (50 mcg total) by mouth daily before breakfast.  ? loratadine (CLARITIN) 10 MG tablet Take 10 mg by mouth every evening.   ? losartan (COZAAR) 50 MG tablet Take 1 and 1/2 tablets (75 mg total) by mouth daily.  ? magic mouthwash (nystatin, diphenhydrAMINE, alum & mag hydroxide) suspension mixture Swish and spit 5 mls up to 4 times daily as needed.   ? metFORMIN (GLUCOPHAGE XR) 500 MG 24 hr tablet Take 2 tablets by mouth 2  times daily with a meal. (Patient taking differently: Take 1,000 mg by mouth daily with breakfast.)  ? mirtazapine (REMERON) 15 MG tablet Take 1 tablet by mouth at bedtime.  ? Multiple Vitamins-Minerals (MULTIVITAMIN GUMMIES ADULT PO) Take 2 each by mouth daily.  ? pantoprazole (PROTONIX) 40 MG tablet Take 1 tablet by mouth every evening.  ? prochlorperazine (COMPAZINE) 10 MG tablet Take 1 tablet (10 mg total) by mouth every 6 (six) hours as needed for nausea or vomiting.  ? rosuvastatin (CRESTOR) 20 MG tablet Take 1 tablet by mouth daily.  ? Semaglutide,0.25 or 0.5MG /DOS, (OZEMPIC, 0.25 OR 0.5 MG/DOSE,) 2 MG/3ML SOPN Inject 0.25 mg into the skin once a week.  ? SYRINGE-NEEDLE, DISP, 3 ML 21G X 1-1/2" 3 ML MISC Use to inject testosterone every week  ? Testosterone 20.25 MG/ACT (1.62%) GEL Apply 20.25mg /ACT on each shoulder every morning  ? venlafaxine XR (EFFEXOR-XR) 75 MG 24 hr capsule Take 1 capsule by mouth daily with breakfast.  ? [DISCONTINUED] levothyroxine (SYNTHROID) 25 MCG tablet Take 1 tablet by mouth daily before breakfast.  ? [DISCONTINUED] testosterone cypionate (DEPOTESTOTERONE CYPIONATE) 100 MG/ML injection Inject 0.5 MLs into the muscle every 7 days  ? ?No facility-administered encounter medications on file as of 01/29/2022.  ? ?ALLERGIES: ?No Known Allergies ? ?VACCINATION STATUS: ?Immunization History  ?Administered Date(s) Administered  ? Hepatitis B 02/06/1990, 10/28/1990, 04/02/1991  ? Influenza,inj,Quad PF,6+ Mos 07/07/2019, 07/31/2021  ? Influenza-Unspecified 07/25/2018  ? Moderna Sars-Covid-2 Vaccination 12/01/2019, 12/30/2019, 10/19/2020  ? Pneumococcal Conjugate-13 03/11/2017  ? Tdap 01/17/2016  ? ? ?HPI ?Bradley Goodman is 66 y.o. male who presents today for follow-up after he was seen in consultation for multiple medical problems as follows.  ?He notes from previous visits. ?History is obtained directly from the  patient as well as chart review. ?He is accompanied by his wife.  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  He is status post radiosurgery of CNS metastasis and chemotherapy.  His chemotherapy in the involved high-dose steroids in the form of dexamethasone, currently on dexamethasone 2 mg p.o. daily.   ?During this process, he developed fatigue, low libido, and loss of skeletal muscle mass.  His labs showed hypogonadism, partial adrenal insufficiency, and hypothyroidism.  For this endocrine deficits, he was started on testosterone 50 mg IM every 7  days which has helped increase his testosterone from undetectable to 234, low-dose levothyroxine for secondary hypothyroidism.   ?His previsit thyroid function tests are consistent with slight under replacement. ?He returns with significant improvement symptomatically besides correction of his biochemical deficits. ?He denies prior testicular injury.  He denies testicular radiation . ?He wishes to be continued on testosterone replacement therapy as well as his other hormones.  He has steady weight since last visit.   ? ?He is back to enjoy his outdoor activities including golfing. ? ? ? ?Review of Systems ? ?Constitutional: + Stable weight,   + fatigue, + subjective hypothermia, + low libido ? ?Objective:  ?  ? ?  01/29/2022  ? 10:29 AM 01/15/2022  ?  8:58 AM 01/15/2022  ?  8:09 AM  ?Vitals with BMI  ?Height 6\' 1"   6\' 1"   ?Weight 185 lbs  186 lbs 3 oz  ?BMI 24.41  24.57  ?Systolic 195  093  ?Diastolic 80  83  ?Pulse 100 99 102  ? ? ?BP 118/80   Pulse 100   Ht 6\' 1"  (1.854 m)   Wt 185 lb (83.9 kg)   BMI 24.41 kg/m?   ?Wt Readings from Last 3 Encounters:  ?01/29/22 185 lb (83.9 kg)  ?01/15/22 186 lb 3 oz (84.5 kg)  ?01/10/22 184 lb 12.8 oz (83.8 kg)  ?  ?Physical Exam ? ?Constitutional:  Body mass index is 24.41 kg/m?.,  not in acute distress, normal state of mind ?Eyes: PERRLA, EOMI, no exophthalmos ?ENT: moist  mucous membranes, no gross thyromegaly, no gross cervical lymphadenopathy ? ? ?CMP ( most recent) ?CMP  ?   ?Component Value Date/Time  ? NA 141 01/15/2022 0748  ? NA 144 01/10/2021 0849  ? K 3.6 01/15/2022 0748

## 2022-01-29 NOTE — Patient Instructions (Signed)

## 2022-01-30 ENCOUNTER — Ambulatory Visit (HOSPITAL_COMMUNITY)
Admission: RE | Admit: 2022-01-30 | Discharge: 2022-01-30 | Disposition: A | Discharge status: Home / Self Care | Payer: 59 | Source: Ambulatory Visit | Attending: Physician Assistant | Admitting: Physician Assistant

## 2022-01-30 DIAGNOSIS — R911 Solitary pulmonary nodule: Secondary | ICD-10-CM | POA: Diagnosis not present

## 2022-01-30 DIAGNOSIS — C3491 Malignant neoplasm of unspecified part of right bronchus or lung: Secondary | ICD-10-CM | POA: Insufficient documentation

## 2022-01-30 DIAGNOSIS — I700 Atherosclerosis of aorta: Secondary | ICD-10-CM | POA: Diagnosis not present

## 2022-01-30 DIAGNOSIS — K760 Fatty (change of) liver, not elsewhere classified: Secondary | ICD-10-CM | POA: Diagnosis not present

## 2022-01-30 DIAGNOSIS — R918 Other nonspecific abnormal finding of lung field: Secondary | ICD-10-CM | POA: Diagnosis not present

## 2022-01-30 DIAGNOSIS — N200 Calculus of kidney: Secondary | ICD-10-CM | POA: Diagnosis not present

## 2022-01-30 DIAGNOSIS — K5730 Diverticulosis of large intestine without perforation or abscess without bleeding: Secondary | ICD-10-CM | POA: Diagnosis not present

## 2022-01-30 IMAGING — CT CT CHEST W/ CM
2 of 6 series · 12 of 36 positions shown, 15 images · IV contrast (OMNIPAQUE)
Comparison: CT the chest, abdomen and pelvis [DATE].

CLINICAL DATA: 65-year-old male with history of metastatic
non-small cell lung cancer. Follow-up study.

* Tracking Code: BO *
EXAM:
CT CHEST, ABDOMEN, AND PELVIS WITH CONTRAST
TECHNIQUE: Multidetector CT imaging of the chest, abdomen and pelvis was
performed following the standard protocol during bolus
administration of intravenous contrast.

[Series 2: cap with · axial · 0.76mm/px · z∈[-664,-129]mm · 9 of 135 slices shown, 12 images]
[im 14/135  mediastinal]
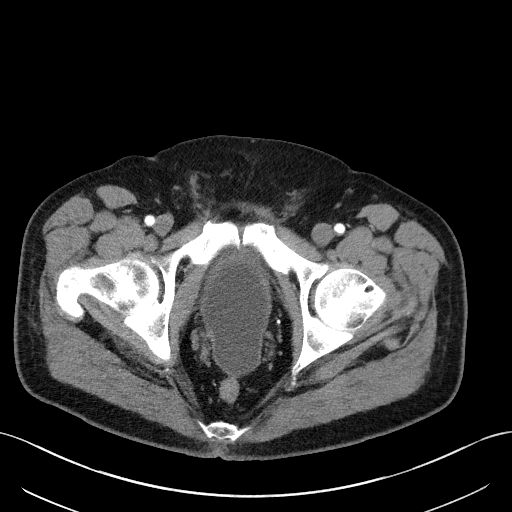
[im 14/135  lung]
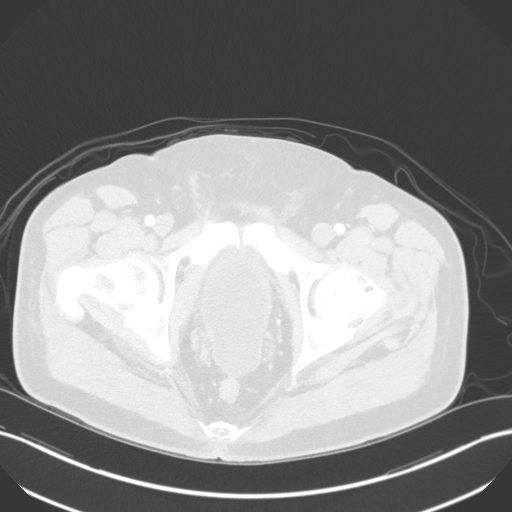
[im 27/135  lung]
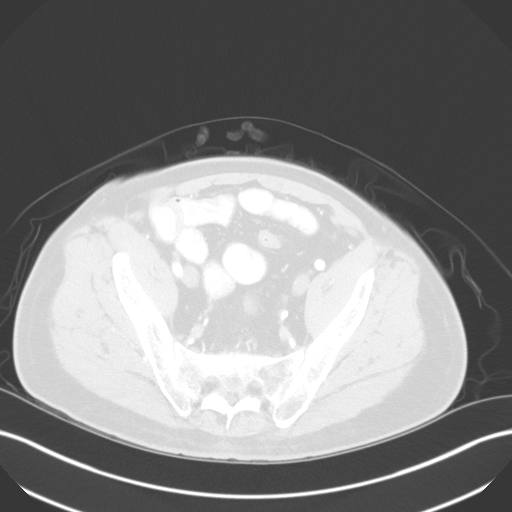
[im 41/135  lung]
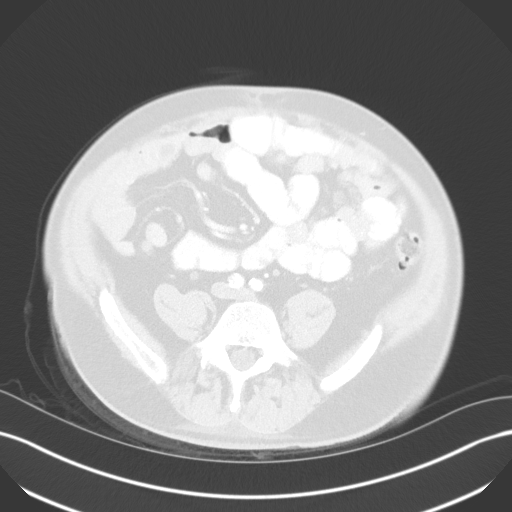
[im 54/135  lung]
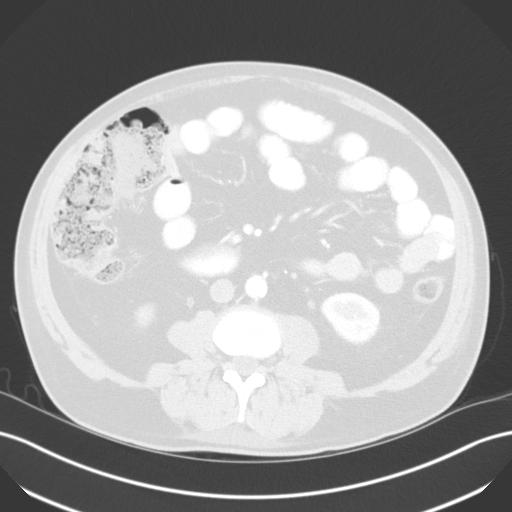
[im 68/135  mediastinal]
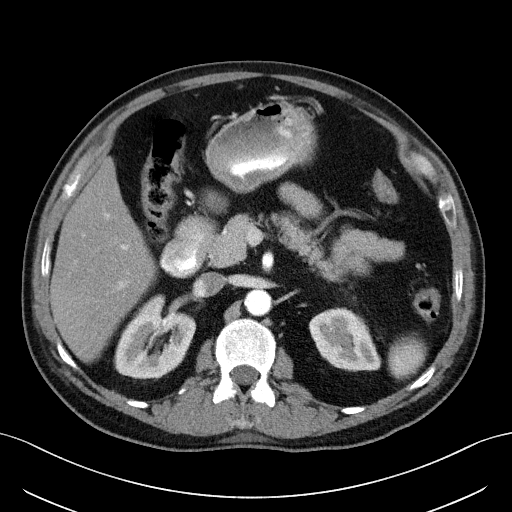
[im 68/135  lung]
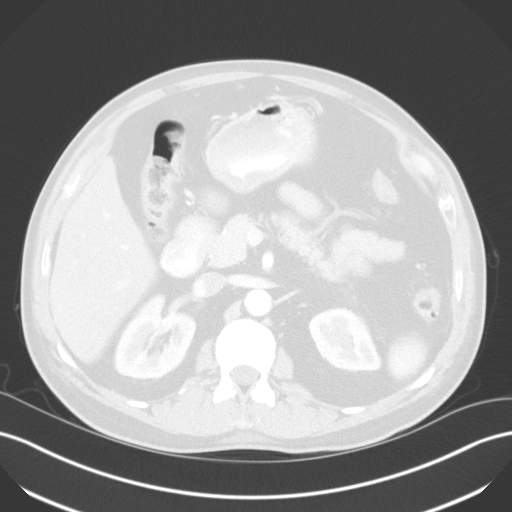
[im 81/135  lung]
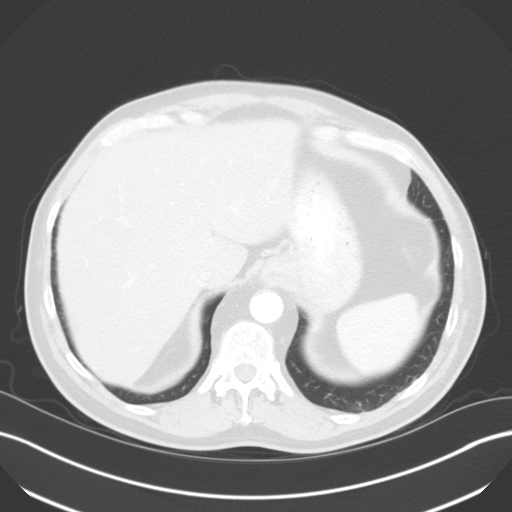
[im 94/135  lung]
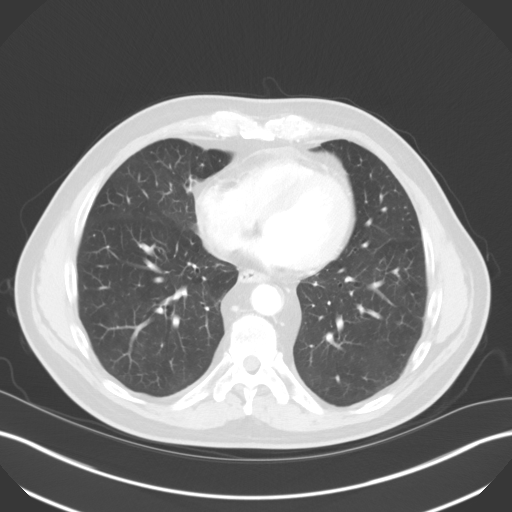
[im 108/135  lung]
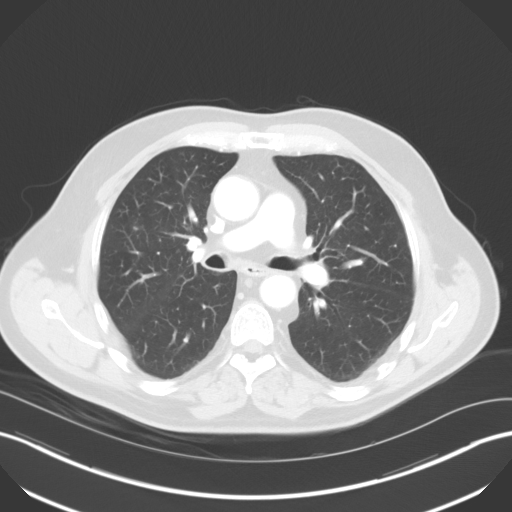
[im 121/135  mediastinal]
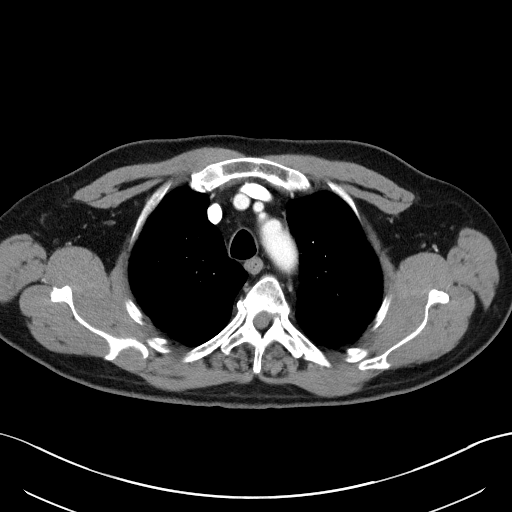
[im 121/135  lung]
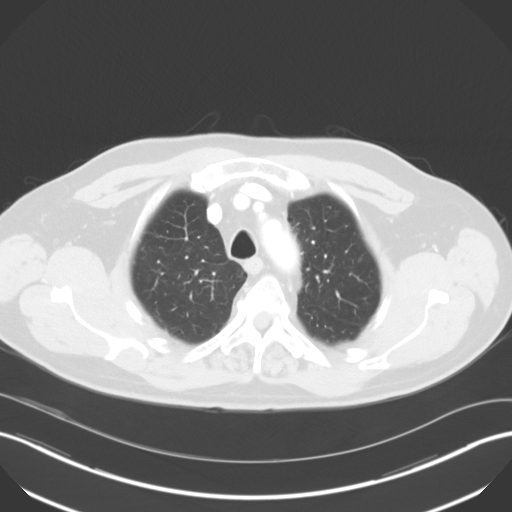

[Series 4: coronals · coronal · 0.89mm/px · 3 of 158 slices shown]
[im 32/158  lung]
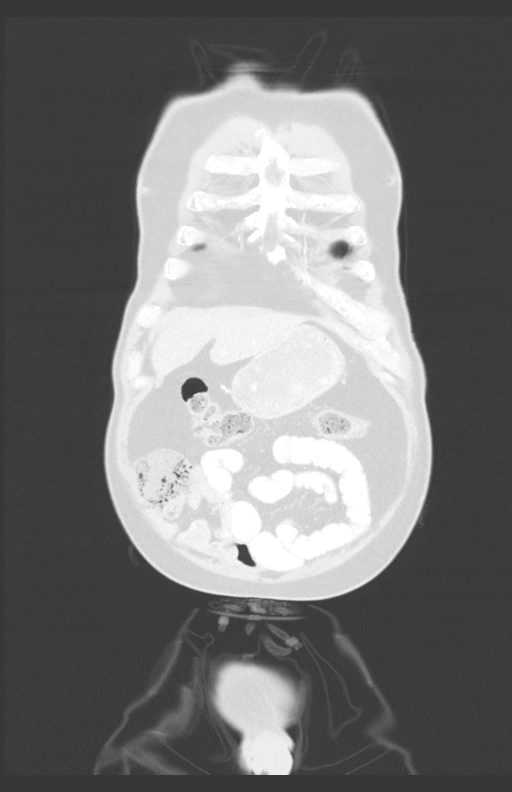
[im 63/158  lung]
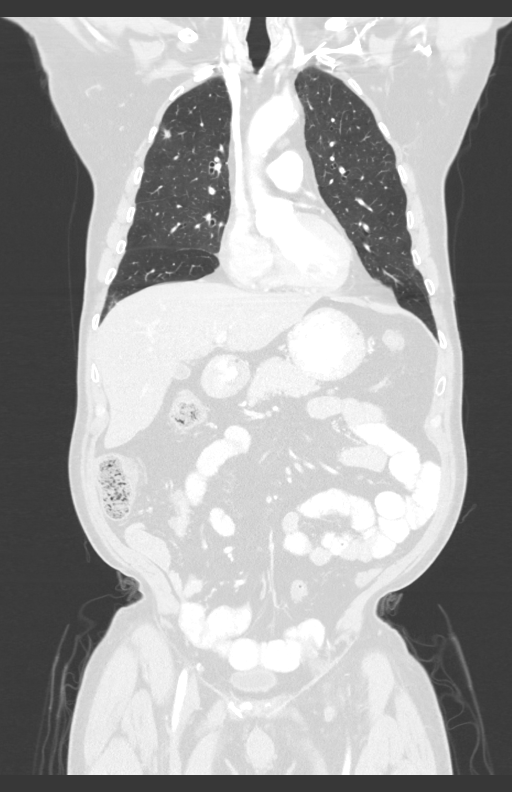
[im 95/158  lung]
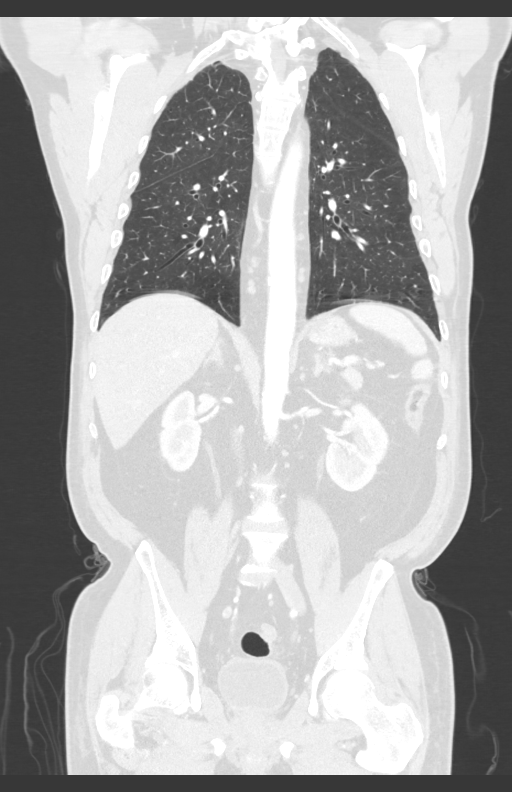

[12 of 36 positions shown; findings below may reference images not displayed]

RADIATION DOSE REDUCTION: This exam was performed according to the
departmental dose-optimization program which includes automated
exposure control, adjustment of the mA and/or kV according to
patient size and/or use of iterative reconstruction technique.

CONTRAST:  100mL OMNIPAQUE IOHEXOL 300 MG/ML  SOLN
FINDINGS: CT CHEST FINDINGS

Cardiovascular: Heart size is normal. There is no significant
pericardial fluid, thickening or pericardial calcification. There is
aortic atherosclerosis, as well as atherosclerosis of the great
vessels of the mediastinum and the coronary arteries, including
calcified atherosclerotic plaque in the left main and left anterior
descending coronary arteries.

Mediastinum/Nodes: No pathologically enlarged mediastinal or hilar
lymph nodes. Esophagus is unremarkable in appearance. No axillary
lymphadenopathy.

Lungs/Pleura: Several small pulmonary nodules are again noted
scattered throughout the lungs bilaterally, generally stable
compared to the prior examination. The exceptions include a small 4
mm subpleural nodule in the anterior left upper lobe (axial image 66
of series 6) and a 8 x 4 mm (mean diameter of 6 mm) right upper lobe
pulmonary nodule (axial image 53 of series 6), both of which are new
compared to the prior study. No other larger more suspicious
appearing pulmonary nodules or masses are noted. No acute
consolidative airspace disease. No pleural effusions.

Musculoskeletal: There are no aggressive appearing lytic or blastic
lesions noted in the visualized portions of the skeleton.

CT ABDOMEN PELVIS FINDINGS

Hepatobiliary: Diffuse low attenuation throughout the hepatic
parenchyma, indicative of a background of hepatic steatosis. 1.3 x
1.0 cm hypovascular lesion in segment 2 of the liver (axial image 52
of series 2), stable, likely benign. Subcentimeter low-attenuation
lesion in segment 6 of the liver (axial image 73 of series 2), too
small to characterize, but also stable compared to the prior
examination, favored to represent a cyst. No other new suspicious
hepatic lesions. No intra or extrahepatic biliary ductal dilatation.
Gallbladder is normal in appearance.

Pancreas: No pancreatic mass. No pancreatic ductal dilatation. No
pancreatic or peripancreatic fluid collections or inflammatory
changes.

Spleen: Unremarkable.

Adrenals/Urinary Tract: 1 cm nonobstructive calculus in the upper
pole collecting system of the left kidney. Right kidney and
bilateral adrenal glands are normal in appearance. No
hydroureteronephrosis. Urinary bladder is normal in appearance.

Stomach/Bowel: The appearance of the stomach is normal. There is no
pathologic dilatation of small bowel or colon. A few scattered
colonic diverticulae are noted, without surrounding inflammatory
changes to indicate an acute diverticulitis at this time. Normal
appendix.

Vascular/Lymphatic: Atherosclerotic calcifications throughout the
abdominal aorta and pelvic vasculature. No aneurysm or dissection
noted in the abdominal or pelvis. No lymphadenopathy noted in the
abdomen or pelvis.

Reproductive: Prostate gland and seminal vesicles are unremarkable
in appearance.

Other: No significant volume of ascites.  No pneumoperitoneum.

Musculoskeletal: There are no aggressive appearing lytic or blastic
lesions noted in the visualized portions of the skeleton.
IMPRESSION: 1. Small pulmonary nodules in the lungs bilaterally, most of which
are stable compared to the prior examination, with a couple of
exceptions (described above), largest of which is in the right upper
lobe with a mean diameter of 6 mm. Attention on follow-up imaging is
recommended to ensure stability or regression.
2. Stable findings in the abdomen or pelvis without definitive
evidence to suggest metastatic disease to the abdomen or pelvis.
3. Aortic atherosclerosis, in addition to left main and left
anterior descending coronary artery disease. Please note that
although the presence of coronary artery calcium documents the
presence of coronary artery disease, the severity of this disease
and any potential stenosis cannot be assessed on this non-gated CT
examination. Assessment for potential risk factor modification,
dietary therapy or pharmacologic therapy may be warranted, if
clinically indicated.
4. Hepatic steatosis.
5. 1 cm nonobstructive calculus in the upper pole collecting system
of left kidney.
6. Colonic diverticulosis without evidence of acute diverticulitis
at this time.
7. Additional incidental findings, as above.

## 2022-01-30 IMAGING — CT CT ABD-PELV W/ CM
2 of 6 series · 12 of 36 positions shown, 15 images · IV contrast (OMNIPAQUE)
Comparison: CT the chest, abdomen and pelvis [DATE].

CLINICAL DATA: 65-year-old male with history of metastatic
non-small cell lung cancer. Follow-up study.

* Tracking Code: BO *
EXAM:
CT CHEST, ABDOMEN, AND PELVIS WITH CONTRAST
TECHNIQUE: Multidetector CT imaging of the chest, abdomen and pelvis was
performed following the standard protocol during bolus
administration of intravenous contrast.

[Series 2: cap with · axial · 0.76mm/px · z∈[-664,-129]mm · 9 of 135 slices shown, 12 images]
[im 14/135  mediastinal]
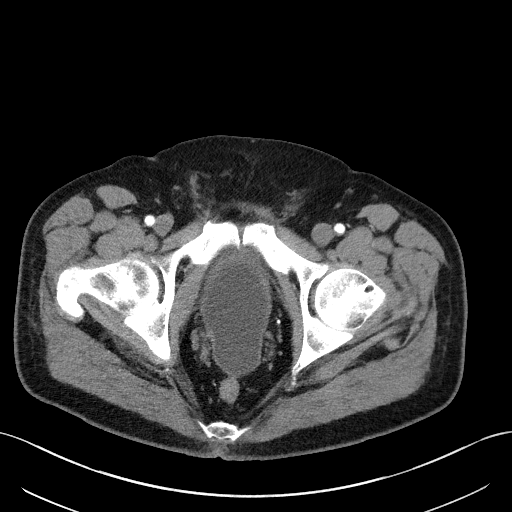
[im 14/135  lung]
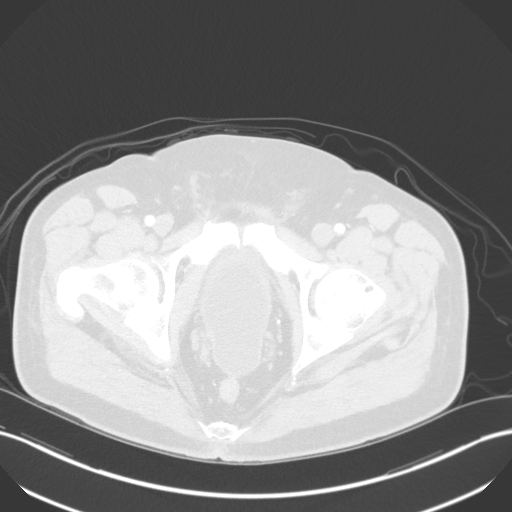
[im 27/135  lung]
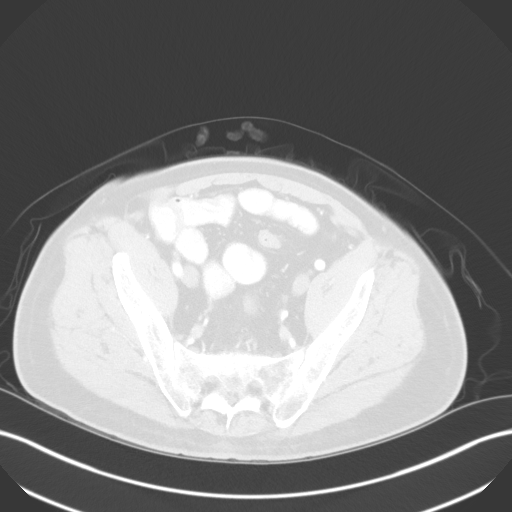
[im 41/135  lung]
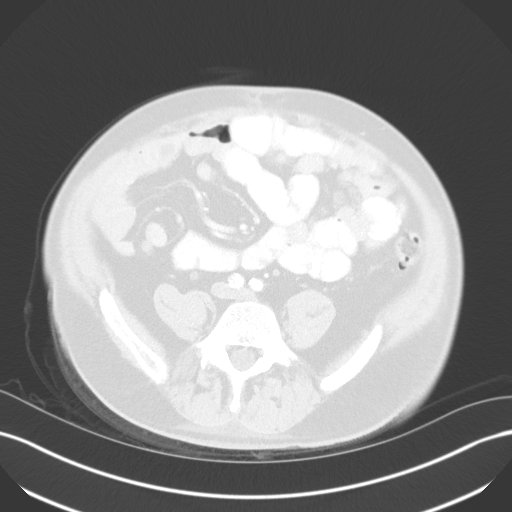
[im 54/135  lung]
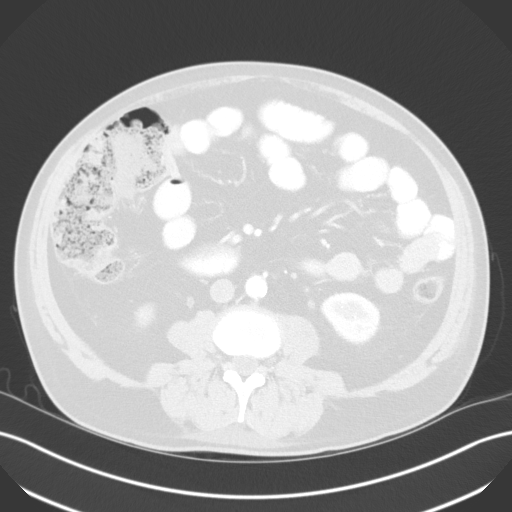
[im 68/135  mediastinal]
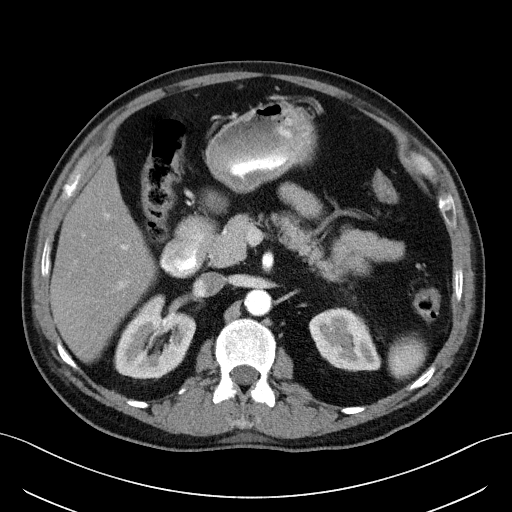
[im 68/135  lung]
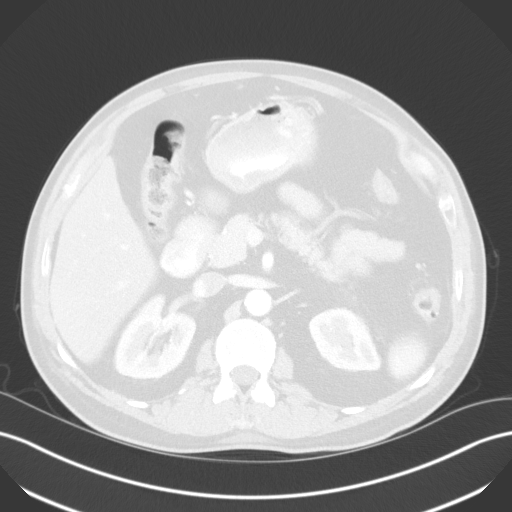
[im 81/135  lung]
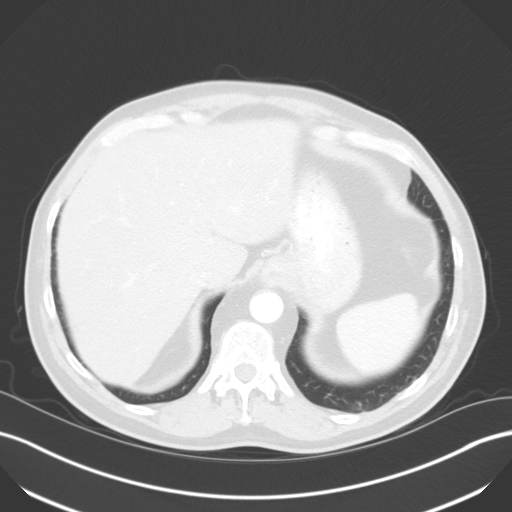
[im 94/135  lung]
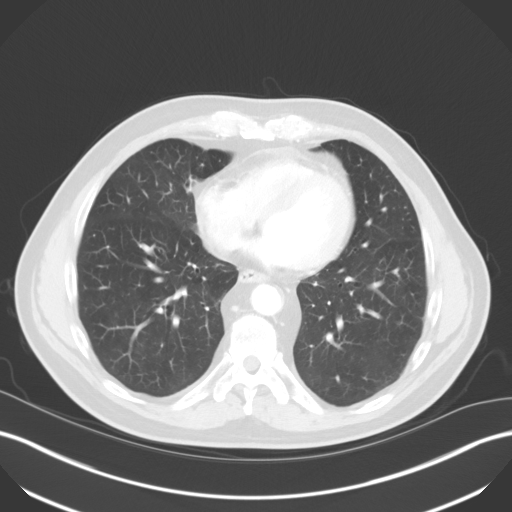
[im 108/135  lung]
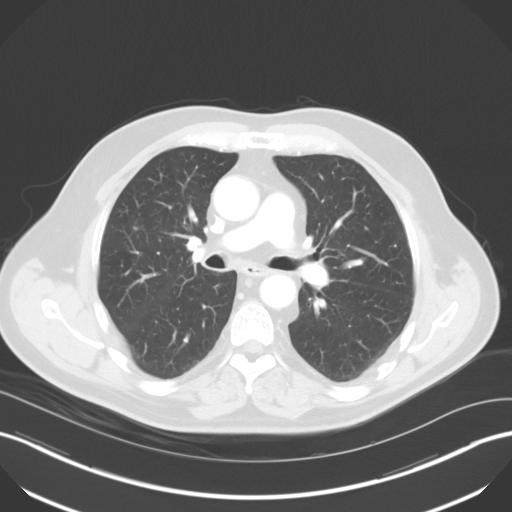
[im 121/135  mediastinal]
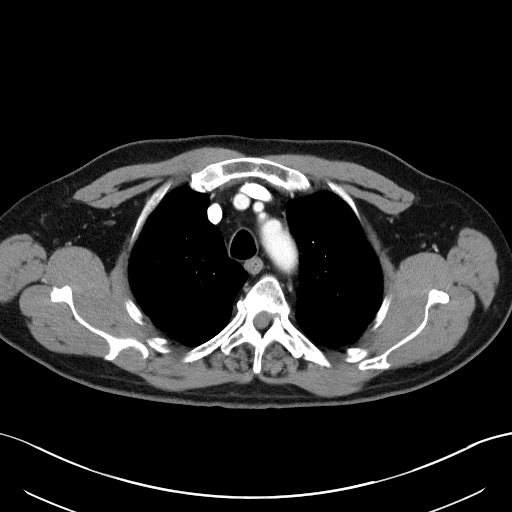
[im 121/135  lung]
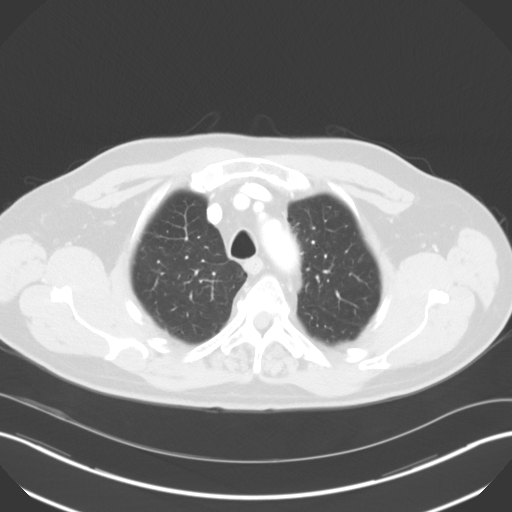

[Series 4: coronals · coronal · 0.89mm/px · 3 of 158 slices shown]
[im 32/158  lung]
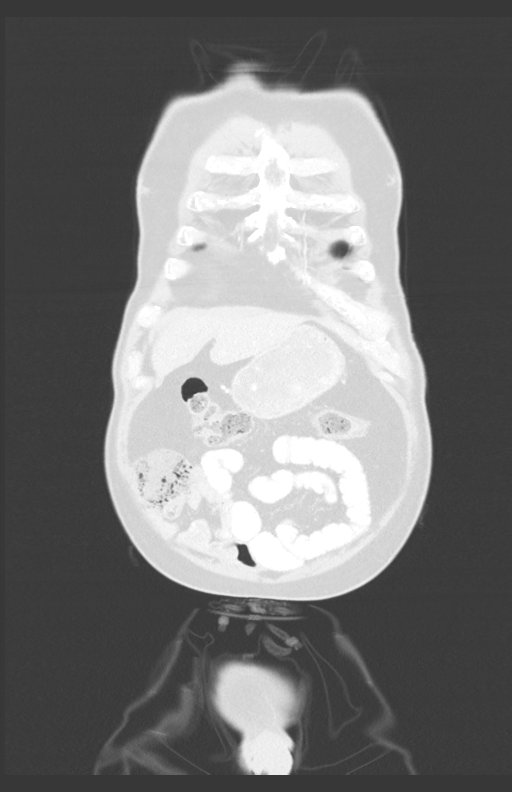
[im 63/158  lung]
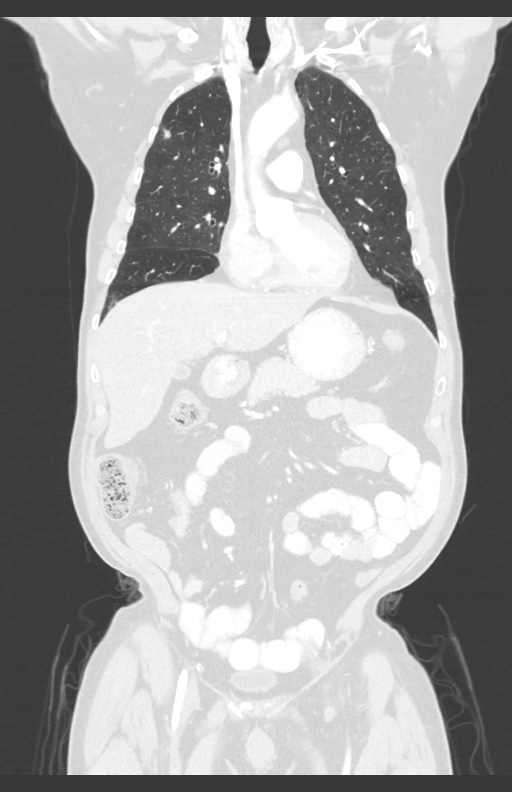
[im 95/158  lung]
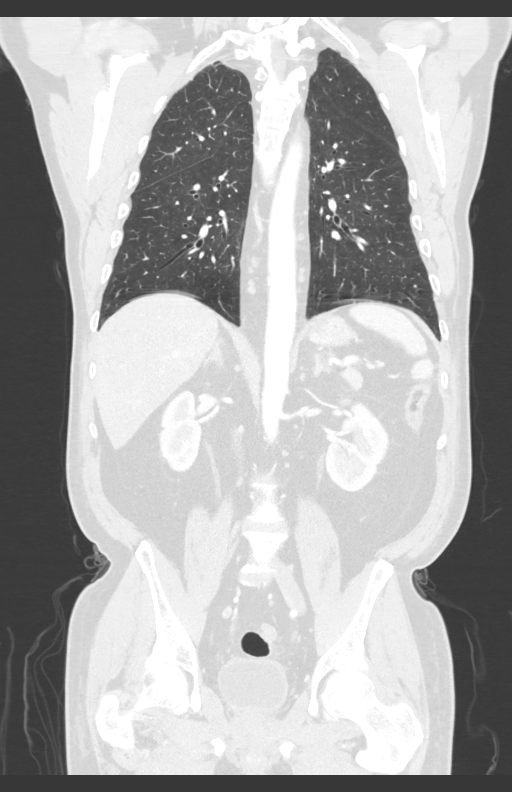

[12 of 36 positions shown; findings below may reference images not displayed]

RADIATION DOSE REDUCTION: This exam was performed according to the
departmental dose-optimization program which includes automated
exposure control, adjustment of the mA and/or kV according to
patient size and/or use of iterative reconstruction technique.

CONTRAST:  100mL OMNIPAQUE IOHEXOL 300 MG/ML  SOLN
FINDINGS: CT CHEST FINDINGS

Cardiovascular: Heart size is normal. There is no significant
pericardial fluid, thickening or pericardial calcification. There is
aortic atherosclerosis, as well as atherosclerosis of the great
vessels of the mediastinum and the coronary arteries, including
calcified atherosclerotic plaque in the left main and left anterior
descending coronary arteries.

Mediastinum/Nodes: No pathologically enlarged mediastinal or hilar
lymph nodes. Esophagus is unremarkable in appearance. No axillary
lymphadenopathy.

Lungs/Pleura: Several small pulmonary nodules are again noted
scattered throughout the lungs bilaterally, generally stable
compared to the prior examination. The exceptions include a small 4
mm subpleural nodule in the anterior left upper lobe (axial image 66
of series 6) and a 8 x 4 mm (mean diameter of 6 mm) right upper lobe
pulmonary nodule (axial image 53 of series 6), both of which are new
compared to the prior study. No other larger more suspicious
appearing pulmonary nodules or masses are noted. No acute
consolidative airspace disease. No pleural effusions.

Musculoskeletal: There are no aggressive appearing lytic or blastic
lesions noted in the visualized portions of the skeleton.

CT ABDOMEN PELVIS FINDINGS

Hepatobiliary: Diffuse low attenuation throughout the hepatic
parenchyma, indicative of a background of hepatic steatosis. 1.3 x
1.0 cm hypovascular lesion in segment 2 of the liver (axial image 52
of series 2), stable, likely benign. Subcentimeter low-attenuation
lesion in segment 6 of the liver (axial image 73 of series 2), too
small to characterize, but also stable compared to the prior
examination, favored to represent a cyst. No other new suspicious
hepatic lesions. No intra or extrahepatic biliary ductal dilatation.
Gallbladder is normal in appearance.

Pancreas: No pancreatic mass. No pancreatic ductal dilatation. No
pancreatic or peripancreatic fluid collections or inflammatory
changes.

Spleen: Unremarkable.

Adrenals/Urinary Tract: 1 cm nonobstructive calculus in the upper
pole collecting system of the left kidney. Right kidney and
bilateral adrenal glands are normal in appearance. No
hydroureteronephrosis. Urinary bladder is normal in appearance.

Stomach/Bowel: The appearance of the stomach is normal. There is no
pathologic dilatation of small bowel or colon. A few scattered
colonic diverticulae are noted, without surrounding inflammatory
changes to indicate an acute diverticulitis at this time. Normal
appendix.

Vascular/Lymphatic: Atherosclerotic calcifications throughout the
abdominal aorta and pelvic vasculature. No aneurysm or dissection
noted in the abdominal or pelvis. No lymphadenopathy noted in the
abdomen or pelvis.

Reproductive: Prostate gland and seminal vesicles are unremarkable
in appearance.

Other: No significant volume of ascites.  No pneumoperitoneum.

Musculoskeletal: There are no aggressive appearing lytic or blastic
lesions noted in the visualized portions of the skeleton.
IMPRESSION: 1. Small pulmonary nodules in the lungs bilaterally, most of which
are stable compared to the prior examination, with a couple of
exceptions (described above), largest of which is in the right upper
lobe with a mean diameter of 6 mm. Attention on follow-up imaging is
recommended to ensure stability or regression.
2. Stable findings in the abdomen or pelvis without definitive
evidence to suggest metastatic disease to the abdomen or pelvis.
3. Aortic atherosclerosis, in addition to left main and left
anterior descending coronary artery disease. Please note that
although the presence of coronary artery calcium documents the
presence of coronary artery disease, the severity of this disease
and any potential stenosis cannot be assessed on this non-gated CT
examination. Assessment for potential risk factor modification,
dietary therapy or pharmacologic therapy may be warranted, if
clinically indicated.
4. Hepatic steatosis.
5. 1 cm nonobstructive calculus in the upper pole collecting system
of left kidney.
6. Colonic diverticulosis without evidence of acute diverticulitis
at this time.
7. Additional incidental findings, as above.

## 2022-01-30 MED ORDER — IOHEXOL 300 MG/ML  SOLN
100.0000 mL | Freq: Once | INTRAMUSCULAR | Status: AC | PRN
  Administered 2022-01-30: 100 mL via INTRAVENOUS

## 2022-01-30 MED ORDER — SODIUM CHLORIDE (PF) 0.9 % IJ SOLN
INTRAMUSCULAR | Status: AC
  Filled 2022-01-30: qty 50

## 2022-02-01 ENCOUNTER — Other Ambulatory Visit (HOSPITAL_COMMUNITY): Payer: Self-pay

## 2022-02-02 ENCOUNTER — Telehealth: Payer: Self-pay | Admitting: Internal Medicine

## 2022-02-02 NOTE — Telephone Encounter (Signed)
Called patient regarding upcoming appointments, patient is notified. 

## 2022-02-05 ENCOUNTER — Other Ambulatory Visit (HOSPITAL_COMMUNITY): Payer: Self-pay

## 2022-02-05 ENCOUNTER — Inpatient Hospital Stay (HOSPITAL_BASED_OUTPATIENT_CLINIC_OR_DEPARTMENT_OTHER): Payer: 59 | Admitting: Internal Medicine

## 2022-02-05 ENCOUNTER — Inpatient Hospital Stay: Payer: 59

## 2022-02-05 ENCOUNTER — Encounter: Payer: Self-pay | Admitting: Internal Medicine

## 2022-02-05 ENCOUNTER — Inpatient Hospital Stay: Payer: 59 | Attending: Physician Assistant

## 2022-02-05 VITALS — BP 145/100 | HR 75 | Temp 97.3°F | Resp 16 | Wt 186.6 lb

## 2022-02-05 VITALS — BP 128/78

## 2022-02-05 DIAGNOSIS — C3491 Malignant neoplasm of unspecified part of right bronchus or lung: Secondary | ICD-10-CM

## 2022-02-05 DIAGNOSIS — Z5111 Encounter for antineoplastic chemotherapy: Secondary | ICD-10-CM | POA: Insufficient documentation

## 2022-02-05 DIAGNOSIS — Z923 Personal history of irradiation: Secondary | ICD-10-CM | POA: Insufficient documentation

## 2022-02-05 DIAGNOSIS — Z7984 Long term (current) use of oral hypoglycemic drugs: Secondary | ICD-10-CM | POA: Diagnosis not present

## 2022-02-05 DIAGNOSIS — C7931 Secondary malignant neoplasm of brain: Secondary | ICD-10-CM | POA: Diagnosis not present

## 2022-02-05 DIAGNOSIS — E119 Type 2 diabetes mellitus without complications: Secondary | ICD-10-CM | POA: Diagnosis not present

## 2022-02-05 DIAGNOSIS — Z79899 Other long term (current) drug therapy: Secondary | ICD-10-CM | POA: Insufficient documentation

## 2022-02-05 DIAGNOSIS — F17210 Nicotine dependence, cigarettes, uncomplicated: Secondary | ICD-10-CM | POA: Insufficient documentation

## 2022-02-05 DIAGNOSIS — Z5112 Encounter for antineoplastic immunotherapy: Secondary | ICD-10-CM

## 2022-02-05 DIAGNOSIS — C3411 Malignant neoplasm of upper lobe, right bronchus or lung: Secondary | ICD-10-CM | POA: Diagnosis not present

## 2022-02-05 DIAGNOSIS — C7951 Secondary malignant neoplasm of bone: Secondary | ICD-10-CM | POA: Diagnosis not present

## 2022-02-05 DIAGNOSIS — E538 Deficiency of other specified B group vitamins: Secondary | ICD-10-CM | POA: Insufficient documentation

## 2022-02-05 LAB — CMP (CANCER CENTER ONLY)
ALT: 27 U/L (ref 0–44)
AST: 19 U/L (ref 15–41)
Albumin: 4 g/dL (ref 3.5–5.0)
Alkaline Phosphatase: 59 U/L (ref 38–126)
Anion gap: 6 (ref 5–15)
BUN: 16 mg/dL (ref 8–23)
CO2: 28 mmol/L (ref 22–32)
Calcium: 9.2 mg/dL (ref 8.9–10.3)
Chloride: 107 mmol/L (ref 98–111)
Creatinine: 1.04 mg/dL (ref 0.61–1.24)
GFR, Estimated: >60 mL/min (ref >60)
Glucose, Bld: 140 mg/dL — ABNORMAL HIGH (ref 70–99)
Potassium: 3.8 mmol/L (ref 3.5–5.1)
Sodium: 141 mmol/L (ref 135–145)
Total Bilirubin: 0.2 mg/dL — ABNORMAL LOW (ref 0.3–1.2)
Total Protein: 6.7 g/dL (ref 6.5–8.1)

## 2022-02-05 LAB — CBC WITH DIFFERENTIAL (CANCER CENTER ONLY)
Abs Immature Granulocytes: 0.09 10*3/uL — ABNORMAL HIGH (ref 0.00–0.07)
Basophils Absolute: 0.1 10*3/uL (ref 0.0–0.1)
Basophils Relative: 0 %
Eosinophils Absolute: 0.1 10*3/uL (ref 0.0–0.5)
Eosinophils Relative: 1 %
HCT: 41.2 % (ref 39.0–52.0)
Hemoglobin: 13.2 g/dL (ref 13.0–17.0)
Immature Granulocytes: 1 %
Lymphocytes Relative: 13 %
Lymphs Abs: 1.5 10*3/uL (ref 0.7–4.0)
MCH: 30.1 pg (ref 26.0–34.0)
MCHC: 32 g/dL (ref 30.0–36.0)
MCV: 93.8 fL (ref 80.0–100.0)
Monocytes Absolute: 0.8 10*3/uL (ref 0.1–1.0)
Monocytes Relative: 7 %
Neutro Abs: 9.3 10*3/uL — ABNORMAL HIGH (ref 1.7–7.7)
Neutrophils Relative %: 78 %
Platelet Count: 253 10*3/uL (ref 150–400)
RBC: 4.39 MIL/uL (ref 4.22–5.81)
RDW: 16.8 % — ABNORMAL HIGH (ref 11.5–15.5)
WBC Count: 11.8 10*3/uL — ABNORMAL HIGH (ref 4.0–10.5)
nRBC: 0 % (ref 0.0–0.2)

## 2022-02-05 LAB — TSH: TSH: 1.245 u[IU]/mL (ref 0.350–4.500)

## 2022-02-05 MED ORDER — SODIUM CHLORIDE 0.9 % IV SOLN
200.0000 mg | Freq: Once | INTRAVENOUS | Status: AC
  Administered 2022-02-05: 200 mg via INTRAVENOUS
  Filled 2022-02-05: qty 200

## 2022-02-05 MED ORDER — SODIUM CHLORIDE 0.9 % IV SOLN: Freq: Once | INTRAVENOUS | Status: AC

## 2022-02-05 MED ORDER — CYANOCOBALAMIN 1000 MCG/ML IJ SOLN: 1000.0000 ug | Freq: Once | INTRAMUSCULAR | Status: DC

## 2022-02-05 MED ORDER — PROCHLORPERAZINE MALEATE 10 MG PO TABS
10.0000 mg | ORAL_TABLET | Freq: Once | ORAL | Status: AC
  Administered 2022-02-05: 10 mg via ORAL
  Filled 2022-02-05: qty 1

## 2022-02-05 MED ORDER — SODIUM CHLORIDE 0.9 % IV SOLN
500.0000 mg/m2 | Freq: Once | INTRAVENOUS | Status: AC
  Administered 2022-02-05: 1000 mg via INTRAVENOUS
  Filled 2022-02-05: qty 40

## 2022-02-05 NOTE — Progress Notes (Signed)
?    [facility] ?[phone]   [phone] ? ?OFFICE PROGRESS NOTE ? ?[name], FNP ?[place] [address] ? ?DIAGNOSIS: Stage IV (T1b, N3, M1C) non-small cell lung cancer, favoring adenocarcinoma presented with right upper lobe lung nodule in addition to right hilar, subcarinal and bilateral mediastinal as well as supraclavicular lymphadenopathy in addition to bone and brain metastasis diagnosed in June 2022. ?  ?  ?PD-L1 expression 80%.   ?  ?Molecular Studies:  ?Biomarker Findings ?Microsatellite status - MS-Stable ?Tumor Mutational Burden - 6 Muts/Mb ?Genomic Findings ?For a complete list of the genes assayed, please refer to the Appendix. ?KRAS G12C, amplification ?ATM [phone] ?CCND1 amplification - ?equivocal? ?HGF amplification - ?equivocal? ?MYC amplification - ?equivocal? ?FGF19 amplification - ?equivocal? ?FGF3 amplification - ?equivocal? ?FGF4 amplification - ?equivocal? ?NFKBIA amplification ?NKX2-1 amplification ?RAD21 amplification - ?equivocal? ?[id number]*26 ?TERT promoter -124C>T ?TP53 rearrangement ?exon 9 ?7 Disease relevant genes with no reportable ?alterations: ALK, BRAF, EGFR, ERBB2, MET, RET, ROS1 ?  ?PRIOR THERAPY: SRS to the metastatic brain lesions under the care of Dr. Tammi Klippel.  Last treatment on 05/04/2021. ?  ?CURRENT THERAPY: Palliative systemic chemotherapy with carboplatin for an AUC 5, Alimta 500 mg/m2 and, Keytruda 200 mg IV every 3 weeks.  First dose expected on 05/08/2021.  Status post 13 cycles.  Starting from cycle #5 he is on maintenance treatment with Alimta and Keytruda every 3 weeks. ? ?INTERVAL HISTORY: ?Bradley Goodman 66 y.o. male returns to the clinic today for follow-up visit.  The patient is feeling fine today with no concerning complaints except for mild fatigue.  He was working this weekend.  He denied having any chest pain, shortness of breath, cough or hemoptysis.  He has no nausea, vomiting, diarrhea or  constipation.  He has no headache or visual changes.  He denied having any weight loss or night sweats.  He continues to tolerate his maintenance treatment with Alimta and Keytruda fairly well.  The patient is here today for evaluation with repeat CT scan of the chest, abdomen and pelvis for restaging of his disease. ? ?MEDICAL HISTORY: ?Past Medical History:  ?Diagnosis Date  ? Cervical spondylolysis   ? Essential hypertension   ? GERD (gastroesophageal reflux disease)   ? History of kidney stones   ? History of migraine   ? Hyperlipidemia   ? Hypertension   ? PONV (postoperative nausea and vomiting)   ? Type 2 diabetes mellitus ([place])   ? ? ?ALLERGIES:  has No Known Allergies. ? ?MEDICATIONS:  ?Current Outpatient Medications  ?Medication Sig Dispense Refill  ? cholecalciferol (VITAMIN D) 1000 UNITS tablet Take 1,000 Units by mouth every evening.    ? Continuous Blood Gluc Sensor (FREESTYLE LIBRE 3 SENSOR) MISC Place 1 sensor on the skin every 14 days. Use to check glucose continuously 2 each 2  ? dexamethasone (DECADRON) 1 MG tablet Take 2 tablets by mouth daily. 180 tablet 1  ? docusate sodium (COLACE) 100 MG capsule Take 100 mg by mouth daily.    ? folic acid (FOLVITE) 1 MG tablet Take 1 tablet by mouth daily. 30 tablet 4  ? Krill Oil 300 MG CAPS Take 300 mg by mouth every evening.    ? levothyroxine (SYNTHROID) 50 MCG tablet Take 1 tablet (50 mcg total) by mouth daily before breakfast. 90 tablet 1  ? loratadine (CLARITIN) 10 MG tablet Take 10 mg by mouth every evening.     ? losartan (COZAAR)  50 MG tablet Take 1 and 1/2 tablets (75 mg total) by mouth daily. 135 tablet 1  ? magic mouthwash (nystatin, diphenhydrAMINE, alum & mag hydroxide) suspension mixture Swish and spit 5 mls up to 4 times daily as needed. 240 mL 1  ? metFORMIN (GLUCOPHAGE XR) 500 MG 24 hr tablet Take 2 tablets by mouth 2  times daily with a meal. (Patient taking differently: Take 1,000 mg by mouth daily with breakfast.) 360 tablet 1  ?  mirtazapine (REMERON) 15 MG tablet Take 1 tablet by mouth at bedtime. 90 tablet 1  ? Multiple Vitamins-Minerals (MULTIVITAMIN GUMMIES ADULT PO) Take 2 each by mouth daily.    ? pantoprazole (PROTONIX) 40 MG tablet Take 1 tablet by mouth every evening. 90 tablet 1  ? prochlorperazine (COMPAZINE) 10 MG tablet Take 1 tablet (10 mg total) by mouth every 6 (six) hours as needed for nausea or vomiting. 30 tablet 0  ? rosuvastatin (CRESTOR) 20 MG tablet Take 1 tablet by mouth daily. 90 tablet 1  ? Semaglutide,0.25 or 0.5MG /DOS, (OZEMPIC, 0.25 OR 0.5 MG/DOSE,) 2 MG/3ML SOPN Inject 0.25 mg into the skin once a week. 3 mL 0  ? SYRINGE-NEEDLE, DISP, 3 ML 21G X 1-1/2" 3 ML MISC Use to inject testosterone every week 100 each 2  ? Testosterone 20.25 MG/ACT (1.62%) GEL Apply 20.25mg /ACT on each shoulder every morning 75 g 0  ? venlafaxine XR (EFFEXOR-XR) 75 MG 24 hr capsule Take 1 capsule by mouth daily with breakfast. 90 capsule 1  ? ?No current facility-administered medications for this visit.  ? ? ?SURGICAL HISTORY:  ?Past Surgical History:  ?Procedure Laterality Date  ? Bilateral inguinal hernia repair    ? BRONCHIAL NEEDLE ASPIRATION BIOPSY  04/05/2021  ? Procedure: BRONCHIAL NEEDLE ASPIRATION BIOPSIES;  Surgeon: Garner Nash, DO;  Location: [facility];  Service: Pulmonary;;  ? COLONOSCOPY  01/23/2012  ? Procedure: COLONOSCOPY;  Surgeon: Daneil Dolin, MD;  [facility];  Service: Endoscopy;  Laterality: N/A;  9:30 AM  ? COLONOSCOPY N/A 10/11/2015  ? Procedure: COLONOSCOPY;  Surgeon: Daneil Dolin, MD;  [facility];  Service: Endoscopy;  Laterality: N/A;  830  ? COLONOSCOPY N/A 10/14/2019  ? Procedure: COLONOSCOPY;  Surgeon: Daneil Dolin, MD;  [facility];  Service: Endoscopy;  Laterality: N/A;  1:45  ? POLYPECTOMY  10/14/2019  ? Procedure: POLYPECTOMY;  Surgeon: Daneil Dolin, MD;  [facility];  Service: Endoscopy;;  ascending colon, descending colon  ? VIDEO BRONCHOSCOPY  WITH ENDOBRONCHIAL ULTRASOUND N/A 04/05/2021  ? Procedure: VIDEO BRONCHOSCOPY WITH ENDOBRONCHIAL ULTRASOUND;  Surgeon: Garner Nash, DO;  Location: [facility];  Service: Pulmonary;  Laterality: N/A;  ? ? ?REVIEW OF SYSTEMS:  Constitutional: positive for fatigue ?Eyes: negative ?Ears, nose, mouth, throat, and face: negative ?Respiratory: negative ?Cardiovascular: negative ?Gastrointestinal: negative ?Genitourinary:negative ?Integument/breast: negative ?Hematologic/lymphatic: negative ?Musculoskeletal:negative ?Neurological: negative ?Behavioral/Psych: negative ?Endocrine: negative ?Allergic/Immunologic: negative  ? ?PHYSICAL EXAMINATION: General appearance: alert, cooperative, appears stated age, and no distress ?Head: Normocephalic, without obvious abnormality, atraumatic ?Neck: no adenopathy, no JVD, supple, symmetrical, trachea midline, and thyroid not enlarged, symmetric, no tenderness/mass/nodules ?Lymph nodes: Cervical, supraclavicular, and axillary nodes normal. ?Resp: clear to auscultation bilaterally ?Back: symmetric, no curvature. ROM normal. No CVA tenderness. ?Cardio: regular rate and rhythm, S1, S2 normal, no murmur, click, rub or gallop ?GI: soft, non-tender; bowel sounds normal; no masses,  no organomegaly ?Extremities: extremities normal, atraumatic, no cyanosis or edema ?Neurologic: Alert and oriented X  3, normal strength and tone. Normal symmetric reflexes. Normal coordination and gait ? ?ECOG PERFORMANCE STATUS: 1 - Symptomatic but completely ambulatory ? ?Blood pressure (!) 145/100, pulse 75, temperature (!) 97.3 ?F (36.3 ?C), temperature source Tympanic, resp. rate 16, weight 186 lb 9 oz (84.6 kg), SpO2 100 %. ? ?LABORATORY DATA: ?Lab Results  ?Component Value Date  ? WBC 10.4 01/15/2022  ? HGB 12.8 (L) 01/15/2022  ? HCT 39.5 01/15/2022  ? MCV 94.0 01/15/2022  ? PLT 226 01/15/2022  ? ? ?  Chemistry   ?   ?Component Value Date/Time  ? NA 141 01/15/2022 0748  ? NA 144 01/10/2021 0849  ? K 3.6  01/15/2022 0748  ? CL 106 01/15/2022 0748  ? CO2 27 01/15/2022 0748  ? BUN 13 01/15/2022 0748  ? BUN 15 01/10/2021 0849  ? CREATININE 1.13 01/15/2022 0748  ?    ?Component Value Date/Time  ? CALCIUM 9.0 01/15/2022

## 2022-02-07 ENCOUNTER — Encounter: Payer: Self-pay | Admitting: Internal Medicine

## 2022-02-08 ENCOUNTER — Other Ambulatory Visit (HOSPITAL_COMMUNITY): Payer: Self-pay

## 2022-02-08 ENCOUNTER — Encounter: Payer: Self-pay | Admitting: Family Medicine

## 2022-02-08 ENCOUNTER — Ambulatory Visit (INDEPENDENT_AMBULATORY_CARE_PROVIDER_SITE_OTHER): Payer: 59 | Admitting: Family Medicine

## 2022-02-08 DIAGNOSIS — E1165 Type 2 diabetes mellitus with hyperglycemia: Secondary | ICD-10-CM

## 2022-02-08 MED ORDER — OZEMPIC (0.25 OR 0.5 MG/DOSE) 2 MG/3ML ~~LOC~~ SOPN
0.5000 mg | PEN_INJECTOR | SUBCUTANEOUS | 2 refills | Status: DC
  Filled 2022-02-08: qty 3, fill #0
  Filled 2022-02-20 – 2022-02-21 (×2): qty 3, 28d supply, fill #0
  Filled 2022-03-29: qty 3, 28d supply, fill #1
  Filled 2022-04-26: qty 3, 28d supply, fill #2

## 2022-02-08 NOTE — Progress Notes (Signed)
? ?Virtual Visit via Telephone Note ? ?I connected with Bradley Goodman on 02/08/22 at 8:10 AM by telephone and verified that I am speaking with the correct person using two identifiers. Bradley Goodman is currently located at home and nobody is currently with him during this visit. The provider, [name], FNP is located in their home at time of visit. ? ?I discussed the limitations, risks, security and privacy concerns of performing an evaluation and management service by telephone and the availability of in person appointments. I also discussed with the patient that there may be a patient responsible charge related to this service. The patient expressed understanding and agreed to proceed. ? ?Subjective: ?PCP: [name], FNP ? ?Chief Complaint  ?Patient presents with  ? Diabetes  ? ?Patient was started on Ozempic at our last visit due to uncontrolled diabetes. He is tolerating the medication well. Blood sugars have been much better per his report; he does use the freestyle libre. ? ? ?ROS: Per HPI ? ?Current Outpatient Medications:  ?  cholecalciferol (VITAMIN D) 1000 UNITS tablet, Take 1,000 Units by mouth every evening., Disp: , Rfl:  ?  Continuous Blood Gluc Sensor (FREESTYLE LIBRE 3 SENSOR) MISC, Place 1 sensor on the skin every 14 days. Use to check glucose continuously, Disp: 2 each, Rfl: 2 ?  dexamethasone (DECADRON) 1 MG tablet, Take 2 tablets by mouth daily., Disp: 180 tablet, Rfl: 1 ?  docusate sodium (COLACE) 100 MG capsule, Take 100 mg by mouth daily., Disp: , Rfl:  ?  folic acid (FOLVITE) 1 MG tablet, Take 1 tablet by mouth daily., Disp: 30 tablet, Rfl: 4 ?  Krill Oil 300 MG CAPS, Take 300 mg by mouth every evening., Disp: , Rfl:  ?  levothyroxine (SYNTHROID) 50 MCG tablet, Take 1 tablet (50 mcg total) by mouth daily before breakfast., Disp: 90 tablet, Rfl: 1 ?  loratadine (CLARITIN) 10 MG tablet, Take 10 mg by mouth every evening. , Disp: , Rfl:  ?  losartan (COZAAR) 50 MG tablet, Take  1 and 1/2 tablets (75 mg total) by mouth daily., Disp: 135 tablet, Rfl: 1 ?  metFORMIN (GLUCOPHAGE XR) 500 MG 24 hr tablet, Take 2 tablets by mouth 2  times daily with a meal. (Patient taking differently: Take 1,000 mg by mouth daily with breakfast.), Disp: 360 tablet, Rfl: 1 ?  mirtazapine (REMERON) 15 MG tablet, Take 1 tablet by mouth at bedtime., Disp: 90 tablet, Rfl: 1 ?  Multiple Vitamins-Minerals (MULTIVITAMIN GUMMIES ADULT PO), Take 2 each by mouth daily., Disp: , Rfl:  ?  pantoprazole (PROTONIX) 40 MG tablet, Take 1 tablet by mouth every evening., Disp: 90 tablet, Rfl: 1 ?  prochlorperazine (COMPAZINE) 10 MG tablet, Take 1 tablet (10 mg total) by mouth every 6 (six) hours as needed for nausea or vomiting. (Patient not taking: Reported on 02/05/2022), Disp: 30 tablet, Rfl: 0 ?  rosuvastatin (CRESTOR) 20 MG tablet, Take 1 tablet by mouth daily., Disp: 90 tablet, Rfl: 1 ?  Semaglutide,0.25 or 0.5MG /DOS, (OZEMPIC, 0.25 OR 0.5 MG/DOSE,) 2 MG/3ML SOPN, Inject 0.25 mg into the skin once a week., Disp: 3 mL, Rfl: 0 ?  SYRINGE-NEEDLE, DISP, 3 ML 21G X 1-1/2" 3 ML MISC, Use to inject testosterone every week, Disp: 100 each, Rfl: 2 ?  Testosterone 20.25 MG/ACT (1.62%) GEL, Apply 20.25mg /ACT on each shoulder every morning, Disp: 75 g, Rfl: 0 ?  venlafaxine XR (EFFEXOR-XR) 75 MG 24 hr capsule, Take 1 capsule by mouth  daily with breakfast., Disp: 90 capsule, Rfl: 1 ? ?No Known Allergies ?Past Medical History:  ?Diagnosis Date  ? Cervical spondylolysis   ? Essential hypertension   ? GERD (gastroesophageal reflux disease)   ? History of kidney stones   ? History of migraine   ? Hyperlipidemia   ? Hypertension   ? PONV (postoperative nausea and vomiting)   ? Type 2 diabetes mellitus ([place])   ? ? ?Observations/Objective: ?A&O  ?No respiratory distress or wheezing audible over the phone ?Mood, judgement, and thought processes all WNL ? ?Assessment and Plan: ?1. Uncontrolled type 2 diabetes mellitus with hyperglycemia, without  long-term current use of insulin ([place]) ?Patient tolerating Ozempic well.  Will increase from 0.25 mg to 0.5 mg weekly.  Plan to recheck A1c and adjust medications as needed at next scheduled appointment in July. ?- Semaglutide,0.25 or 0.5MG /DOS, (OZEMPIC, 0.25 OR 0.5 MG/DOSE,) 2 MG/3ML SOPN; Inject 0.5 mg into the skin once a week.  Dispense: 3 mL; Refill: 2 ? ? ?Follow Up Instructions: ?Return as scheduled. ? ?I discussed the assessment and treatment plan with the patient. The patient was provided an opportunity to ask questions and all were answered. The patient agreed with the plan and demonstrated an understanding of the instructions. ?  ?The patient was advised to call back or seek an in-person evaluation if the symptoms worsen or if the condition fails to improve as anticipated. ? ?The above assessment and management plan was discussed with the patient. The patient verbalized understanding of and has agreed to the management plan. Patient is aware to call the clinic if symptoms persist or worsen. Patient is aware when to return to the clinic for a follow-up visit. Patient educated on when it is appropriate to go to the emergency department.  ? ?Time call ended: 8:21 AM ? ?I provided 11 minutes of non-face-to-face time during this encounter. ? ?Hendricks Limes, MSN, APRN, FNP-C ?[place] ?02/08/22 ? ? ?

## 2022-02-09 ENCOUNTER — Encounter: Payer: Self-pay | Admitting: Internal Medicine

## 2022-02-09 ENCOUNTER — Ambulatory Visit
Admission: RE | Admit: 2022-02-09 | Discharge: 2022-02-09 | Disposition: A | Discharge status: Home / Self Care | Payer: 59 | Source: Ambulatory Visit | Attending: Internal Medicine | Admitting: Internal Medicine

## 2022-02-09 DIAGNOSIS — I619 Nontraumatic intracerebral hemorrhage, unspecified: Secondary | ICD-10-CM | POA: Diagnosis not present

## 2022-02-09 DIAGNOSIS — C719 Malignant neoplasm of brain, unspecified: Secondary | ICD-10-CM | POA: Diagnosis not present

## 2022-02-09 DIAGNOSIS — C7931 Secondary malignant neoplasm of brain: Secondary | ICD-10-CM

## 2022-02-09 IMAGING — MR MR HEAD WO/W CM
12 series · 48 of 48 positions shown · IV contrast (17ml Multihance)
Comparison: None Available.

CLINICAL DATA: Brain/CNS neoplasm, assess treatment response. SRS
Brain. History of lung carcinoma. Brain radiation.

EXAM:
MRI HEAD WITHOUT AND WITH CONTRAST
TECHNIQUE: Multiplanar, multiecho pulse sequences of the brain and surrounding
structures were obtained without and with intravenous contrast.
CONTRAST:  17mL MULTIHANCE GADOBENATE DIMEGLUMINE 529 MG/ML IV SOLN

[Series 2: FLAIR · sagittal · 3.0mm · 0.78mm/px · 3 of 41 slices shown (1 of 2)]
[im 1/41]
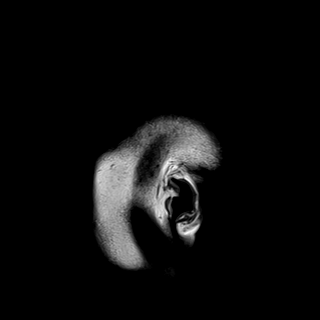
[im 21/41]
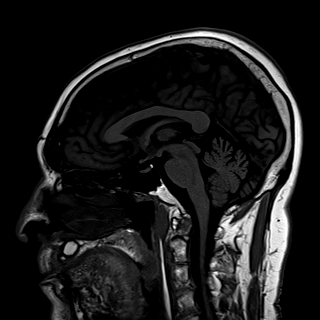
[im 41/41]
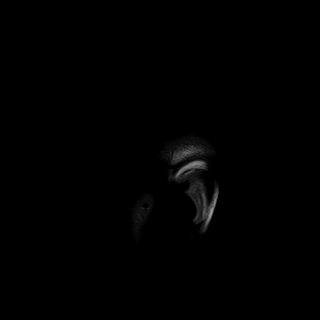

[Series 3: DWI · axial · 3.0mm · 1.56mm/px · z∈[-87,+68]mm · 4 of 82 slices shown (1 of 2)]
[im 1/82]
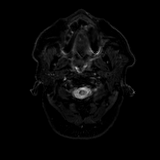
[im 28/82]
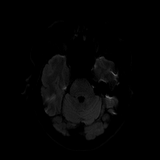
[im 55/82]
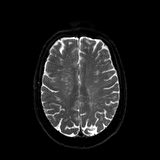
[im 82/82]
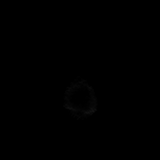

[Series 4: DWI · axial · 3.0mm · 1.56mm/px · z∈[-87,+68]mm · 2 of 40 slices shown (2 of 2)]
[im 1/40]
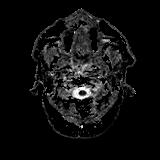
[im 40/40]
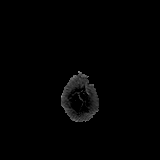

[Series 5: T2 · axial · 5.0mm · 0.65mm/px · 1 of 27 slices shown (1 of 2)]
[im 1/27]
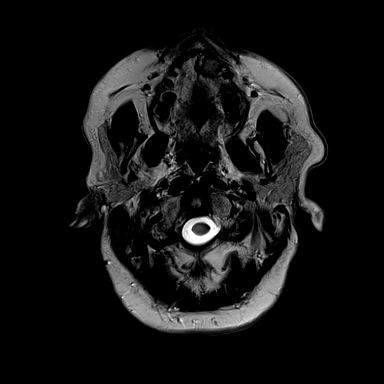

[Series 7: swi_images · axial · 1.5mm · 0.98mm/px · z∈[-79,+74]mm · 5 of 104 slices shown]
[im 1/104]
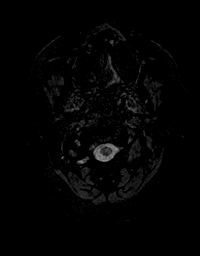
[im 26/104]
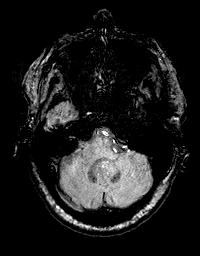
[im 52/104]
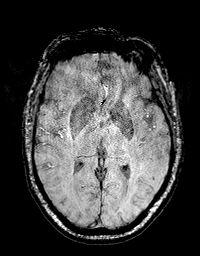
[im 78/104]
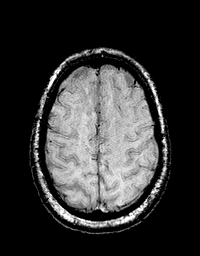
[im 104/104]
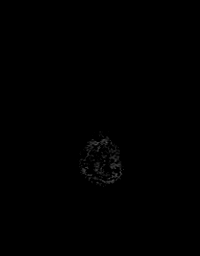

[Series 8: FLAIR · axial · 3.0mm · 0.98mm/px · z∈[-62,+97]mm · 3 of 54 slices shown (2 of 2)]
[im 1/54]
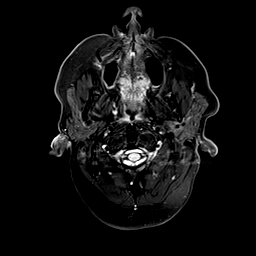
[im 27/54]
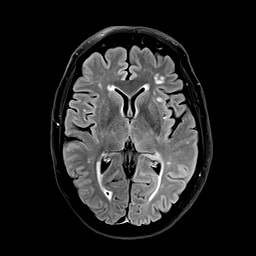
[im 54/54]
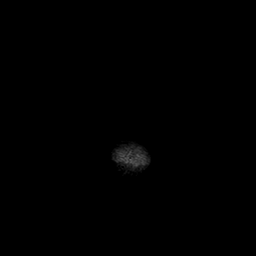

[Series 9: T2 · axial · non-contrast · 1.0mm · 0.98mm/px · z∈[-60,+96]mm · 8 of 160 slices shown (2 of 2)]
[im 1/160]
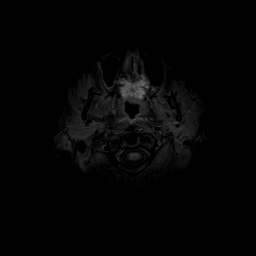
[im 23/160]
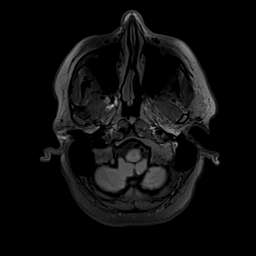
[im 46/160]
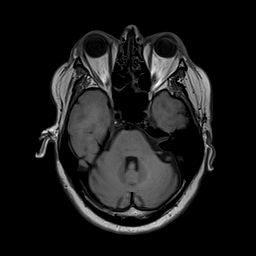
[im 69/160]
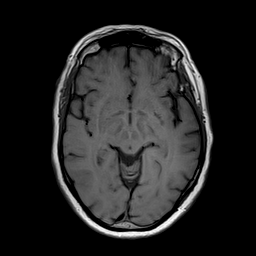
[im 91/160]
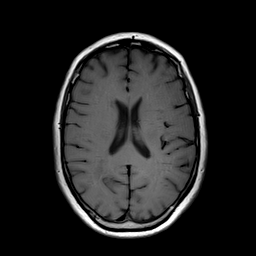
[im 114/160]
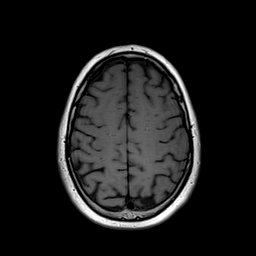
[im 137/160]
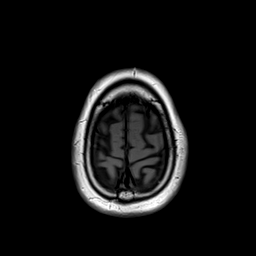
[im 160/160]
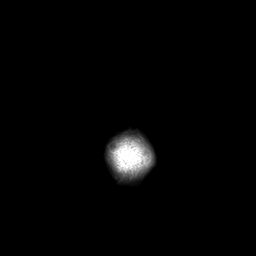

[Series 10: T2 post-contrast · coronal · 3.0mm · 0.57mm/px · 2 of 48 slices shown (1 of 2)]
[im 1/48]
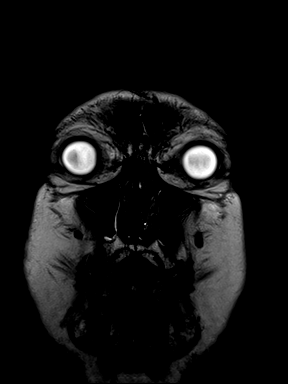
[im 48/48]
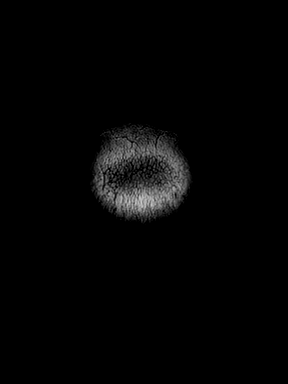

[Series 11: T2 post-contrast · axial · 1.0mm · 0.98mm/px · z∈[-60,+96]mm · 8 of 160 slices shown (2 of 2)]
[im 1/160]
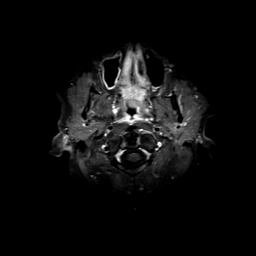
[im 23/160]
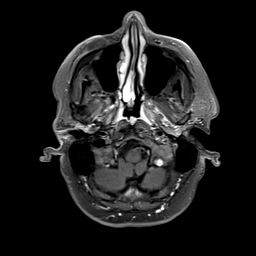
[im 46/160]
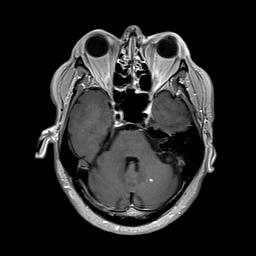
[im 69/160]
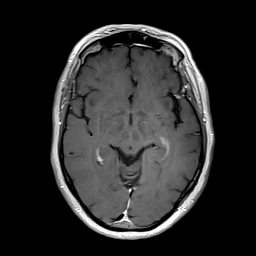
[im 91/160]
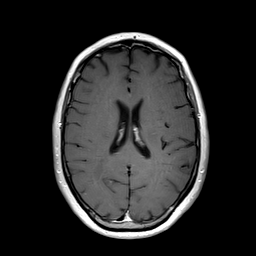
[im 114/160]
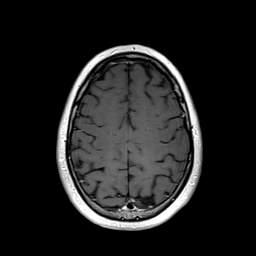
[im 137/160]
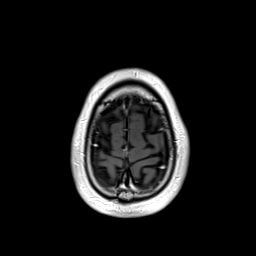
[im 160/160]
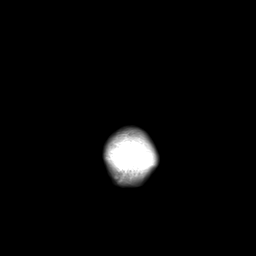

[Series 12: T1 post-contrast · axial · 1.0mm · 0.78mm/px · z∈[-62,+97]mm · 8 of 160 slices shown (1 of 2)]
[im 1/160]
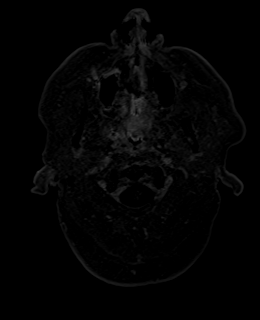
[im 23/160]
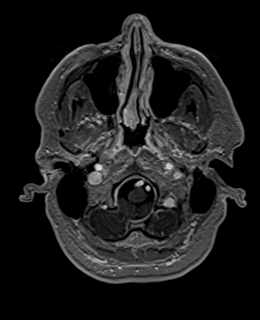
[im 46/160]
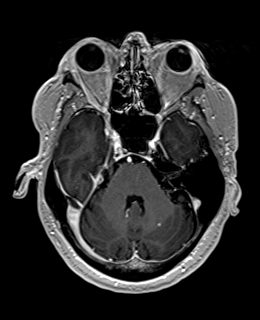
[im 69/160]
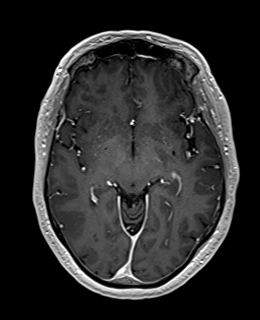
[im 91/160]
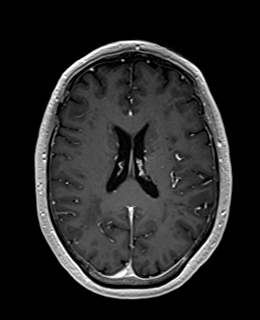
[im 114/160]
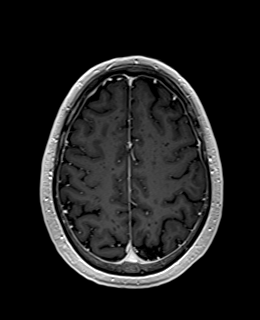
[im 137/160]
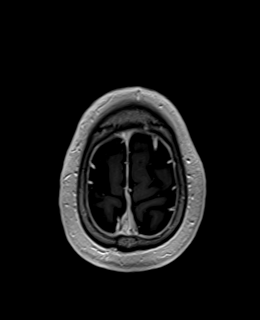
[im 160/160]
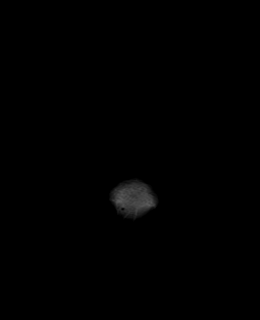

[Series 13: T1 post-contrast · coronal · 3.0mm · 0.57mm/px · 2 of 49 slices shown (2 of 2)]
[im 1/49]
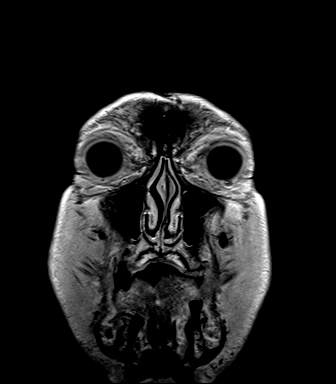
[im 49/49]
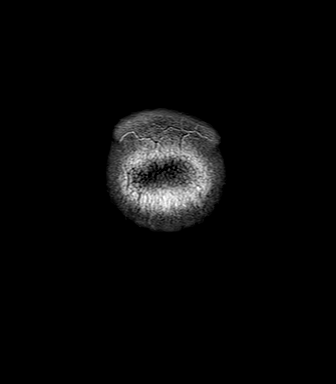

[Series 14: FLAIR post-contrast · sagittal · 3.0mm · 0.78mm/px · 2 of 41 slices shown]
[im 1/41]
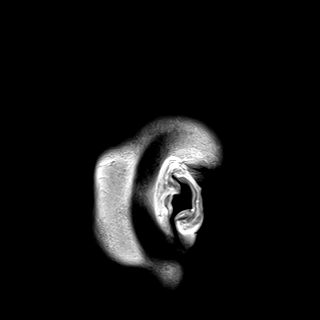
[im 41/41]
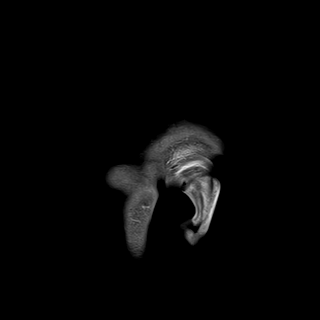

[48 of 48 positions shown; findings below may reference images not displayed]

FINDINGS: Brain: No acute infarct, mass effect or extra-axial collection. No
acute or chronic hemorrhage. There is multifocal hyperintense
T2-weighted signal within the white matter. Parenchymal volume and
CSF spaces are normal. Unchanged punctate contrast enhancing lesions
of the posterior right parietal and left cerebellar lesions (series
11 images 46 and 91). Unchanged appearance of the pituitary stalk.

Vascular: Major flow voids are preserved.

Skull and upper cervical spine: Normal calvarium and skull base.
Visualized upper cervical spine and soft tissues are normal.

Sinuses/Orbits:No paranasal sinus fluid levels or advanced mucosal
thickening. No mastoid or middle ear effusion. Normal orbits.
IMPRESSION: Unchanged appearance of punctate contrast enhancing lesions of the
posterior right parietal and left cerebellar lesions. No new
lesions.

## 2022-02-09 MED ORDER — GADOBENATE DIMEGLUMINE 529 MG/ML IV SOLN
17.0000 mL | Freq: Once | INTRAVENOUS | Status: AC | PRN
  Administered 2022-02-09: 17 mL via INTRAVENOUS

## 2022-02-10 ENCOUNTER — Other Ambulatory Visit (HOSPITAL_COMMUNITY): Payer: Self-pay

## 2022-02-12 ENCOUNTER — Other Ambulatory Visit (HOSPITAL_COMMUNITY): Payer: Self-pay

## 2022-02-12 ENCOUNTER — Inpatient Hospital Stay: Payer: 59

## 2022-02-13 ENCOUNTER — Other Ambulatory Visit: Payer: Self-pay

## 2022-02-13 ENCOUNTER — Inpatient Hospital Stay (HOSPITAL_BASED_OUTPATIENT_CLINIC_OR_DEPARTMENT_OTHER): Payer: 59 | Admitting: Internal Medicine

## 2022-02-13 VITALS — BP 137/90 | HR 107 | Temp 97.4°F | Resp 18 | Ht 73.0 in | Wt 187.0 lb

## 2022-02-13 DIAGNOSIS — Z79899 Other long term (current) drug therapy: Secondary | ICD-10-CM | POA: Diagnosis not present

## 2022-02-13 DIAGNOSIS — C7951 Secondary malignant neoplasm of bone: Secondary | ICD-10-CM | POA: Diagnosis not present

## 2022-02-13 DIAGNOSIS — E119 Type 2 diabetes mellitus without complications: Secondary | ICD-10-CM | POA: Diagnosis not present

## 2022-02-13 DIAGNOSIS — Z7984 Long term (current) use of oral hypoglycemic drugs: Secondary | ICD-10-CM | POA: Diagnosis not present

## 2022-02-13 DIAGNOSIS — C7931 Secondary malignant neoplasm of brain: Secondary | ICD-10-CM

## 2022-02-13 DIAGNOSIS — Z5111 Encounter for antineoplastic chemotherapy: Secondary | ICD-10-CM | POA: Diagnosis not present

## 2022-02-13 DIAGNOSIS — Z5112 Encounter for antineoplastic immunotherapy: Secondary | ICD-10-CM | POA: Diagnosis not present

## 2022-02-13 DIAGNOSIS — E538 Deficiency of other specified B group vitamins: Secondary | ICD-10-CM | POA: Diagnosis not present

## 2022-02-13 DIAGNOSIS — C3411 Malignant neoplasm of upper lobe, right bronchus or lung: Secondary | ICD-10-CM | POA: Diagnosis not present

## 2022-02-13 NOTE — Progress Notes (Signed)
? ?[facility] [address]  ?[place], [place] [address] ?[phone] ? ? ?Interval Evaluation ? ?Date of Service: 02/13/22 ?Patient Name: Bradley Goodman ?Patient MRN: [id number] ?Patient DOB: Jun 15, 1956 ?Provider: Ventura Sellers, MD ? ?Identifying Statement:  ?Bradley Goodman is a 66 y.o. male with Malignant neoplasm metastatic to brain [facility])  ? ?Primary Cancer: ? ?Oncologic History: ?Oncology History  ?Adenocarcinoma of right lung, stage 4 ([place])  ?04/25/2021 Initial Diagnosis  ? Adenocarcinoma of right lung, stage 4 ([place]) ? ?  ?04/25/2021 Cancer Staging  ? Staging form: Lung, AJCC 8th Edition ?- Clinical: Stage IVB (cT1b, cN3, cM1c) - Signed by Curt Bears, MD on 04/25/2021 ? ?  ?05/08/2021 -  Chemotherapy  ? Patient is on Treatment Plan : LUNG CARBOplatin / Pemetrexed / Pembrolizumab q21d Induction x 4 cycles / Maintenance Pemetrexed + Pembrolizumab  ? ?  ?  ? ?CNS Oncologic History ?05/04/21: Radiosurgery to 4 brain metastases, 5 fractions to pituitary lesion ? ?Interval History: ?Bradley Goodman presents today for follow up after recent MRI brain.  Denies any new or progressive changes today.  No issues with visual impairment as prior.  Denies seizures and headaches. ? ?H+P (05/23/21) Patient presented to medical attention initially on 03/27/21 with new onset left eyelid drooping.  CNS imaging was obtained, which demonstrated multiple enhancing lesions consistent with brain metastases.  Systemic workup demonstrated lung cancer, which was confirmed through endobronchial biopsy.  He underwent single fraction radiosurgery to brain lesions, and fractionated radiosurgery to pituitary lesion.  Following radiation, eyelid drooping improved, but he developed "tunnel vision" visual deficits, described as "like looking through plastic".  This has been more or less static over the past week or so, though it did improve transiently after dosing decadron with chemotherapy on 05/08/21.   ? ?Medications: ?Current Outpatient Medications on File Prior to Visit  ?Medication Sig Dispense Refill  ? cholecalciferol (VITAMIN D) 1000 UNITS tablet Take 1,000 Units by mouth every evening.    ? Continuous Blood Gluc Sensor (FREESTYLE LIBRE 3 SENSOR) MISC Place 1 sensor on the skin every 14 days. Use to check glucose continuously 2 each 2  ? dexamethasone (DECADRON) 1 MG tablet Take 2 tablets by mouth daily. 180 tablet 1  ? docusate sodium (COLACE) 100 MG capsule Take 100 mg by mouth daily.    ? folic acid (FOLVITE) 1 MG tablet Take 1 tablet by mouth daily. 30 tablet 4  ? Krill Oil 300 MG CAPS Take 300 mg by mouth every evening.    ? levothyroxine (SYNTHROID) 50 MCG tablet Take 1 tablet (50 mcg total) by mouth daily before breakfast. 90 tablet 1  ? loratadine (CLARITIN) 10 MG tablet Take 10 mg by mouth every evening.     ? losartan (COZAAR) 50 MG tablet Take 1 and 1/2 tablets (75 mg total) by mouth daily. 135 tablet 1  ? metFORMIN (GLUCOPHAGE XR) 500 MG 24 hr tablet Take 2 tablets by mouth 2  times daily with a meal. (Patient taking differently: Take 1,000 mg by mouth daily with breakfast.) 360 tablet 1  ? mirtazapine (REMERON) 15 MG tablet Take 1 tablet by mouth at bedtime. 90 tablet 1  ? Multiple Vitamins-Minerals (MULTIVITAMIN GUMMIES ADULT PO) Take 2 each by mouth daily.    ? pantoprazole (PROTONIX) 40 MG tablet Take 1 tablet by mouth every evening. 90 tablet 1  ? rosuvastatin (CRESTOR) 20 MG tablet Take 1 tablet by mouth daily. 90 tablet 1  ?  Semaglutide,0.25 or 0.5MG /DOS, (OZEMPIC, 0.25 OR 0.5 MG/DOSE,) 2 MG/3ML SOPN Inject 0.5 mg into the skin once a week. 3 mL 2  ? SYRINGE-NEEDLE, DISP, 3 ML 21G X 1-1/2" 3 ML MISC Use to inject testosterone every week 100 each 2  ? Testosterone 20.25 MG/ACT (1.62%) GEL Apply 1 pump on each shoulder every morning 75 g 0  ? venlafaxine XR (EFFEXOR-XR) 75 MG 24 hr capsule Take 1 capsule by mouth daily with breakfast. 90 capsule 1  ? prochlorperazine (COMPAZINE) 10 MG  tablet Take 1 tablet (10 mg total) by mouth every 6 (six) hours as needed for nausea or vomiting. (Patient not taking: Reported on 02/05/2022) 30 tablet 0  ? ?No current facility-administered medications on file prior to visit.  ? ? ?Allergies: No Known Allergies ?Past Medical History:  ?Past Medical History:  ?Diagnosis Date  ? Cervical spondylolysis   ? Essential hypertension   ? GERD (gastroesophageal reflux disease)   ? History of kidney stones   ? History of migraine   ? Hyperlipidemia   ? Hypertension   ? PONV (postoperative nausea and vomiting)   ? Type 2 diabetes mellitus ([place])   ? ?Past Surgical History:  ?Past Surgical History:  ?Procedure Laterality Date  ? Bilateral inguinal hernia repair    ? BRONCHIAL NEEDLE ASPIRATION BIOPSY  04/05/2021  ? Procedure: BRONCHIAL NEEDLE ASPIRATION BIOPSIES;  Surgeon: Garner Nash, DO;  Location: [facility];  Service: Pulmonary;;  ? COLONOSCOPY  01/23/2012  ? Procedure: COLONOSCOPY;  Surgeon: Daneil Dolin, MD;  [facility];  Service: Endoscopy;  Laterality: N/A;  9:30 AM  ? COLONOSCOPY N/A 10/11/2015  ? Procedure: COLONOSCOPY;  Surgeon: Daneil Dolin, MD;  [facility];  Service: Endoscopy;  Laterality: N/A;  830  ? COLONOSCOPY N/A 10/14/2019  ? Procedure: COLONOSCOPY;  Surgeon: Daneil Dolin, MD;  [facility];  Service: Endoscopy;  Laterality: N/A;  1:45  ? POLYPECTOMY  10/14/2019  ? Procedure: POLYPECTOMY;  Surgeon: Daneil Dolin, MD;  [facility];  Service: Endoscopy;;  ascending colon, descending colon  ? VIDEO BRONCHOSCOPY WITH ENDOBRONCHIAL ULTRASOUND N/A 04/05/2021  ? Procedure: VIDEO BRONCHOSCOPY WITH ENDOBRONCHIAL ULTRASOUND;  Surgeon: Garner Nash, DO;  Location: [facility];  Service: Pulmonary;  Laterality: N/A;  ? ?Social History:  ?Social History  ? ?Socioeconomic History  ? Marital status: Married  ?  Spouse name: Not on file  ? Number of children: Not on file  ? Years of education: Not on file  ?  Highest education level: Not on file  ?Occupational History  ? Not on file  ?Tobacco Use  ? Smoking status: Every Day  ?  Packs/day: 0.50  ?  Years: 30.00  ?  Pack years: 15.00  ?  Types: Cigarettes  ?  Start date: 10/30/1973  ? Smokeless tobacco: Never  ?Vaping Use  ? Vaping Use: Never used  ?Substance and Sexual Activity  ? Alcohol use: Yes  ?  Alcohol/week: 0.0 standard drinks  ?  Comment: One drink every 6 months.  ? Drug use: No  ? Sexual activity: Yes  ?Other Topics Concern  ? Not on file  ?Social History Narrative  ? Not on file  ? ?Social Determinants of Health  ? ?Financial Resource Strain: Not on file  ?Food Insecurity: Not on file  ?Transportation Needs: Not on file  ?Physical Activity: Not on file  ?Stress: Not on file  ?Social Connections: Not on file  ?  Intimate Partner Violence: Not on file  ? ?Family History:  ?Family History  ?Problem Relation Age of Onset  ? Hypertension Mother   ? Diabetes Mother   ? Heart attack Mother   ? Hypertension Father   ? Heart attack Father   ? Heart attack Brother   ? Colon cancer Neg Hx   ? ? ?Review of Systems: ?Constitutional: Doesn't report fevers, chills or abnormal weight loss ?Eyes: Doesn't report blurriness of vision ?Ears, nose, mouth, throat, and face: Doesn't report sore throat ?Respiratory: Doesn't report cough, dyspnea or wheezes ?Cardiovascular: Doesn't report palpitation, chest discomfort  ?Gastrointestinal:  Doesn't report nausea, constipation, diarrhea ?GU: Doesn't report incontinence ?Skin: Doesn't report skin rashes ?Neurological: Per HPI ?Musculoskeletal: Doesn't report joint pain ?Behavioral/Psych: Doesn't report anxiety ? ?Physical Exam: ?Vitals:  ? 02/13/22 1054  ?BP: 137/90  ?Pulse: (!) 107  ?Resp: 18  ?Temp: (!) 97.4 ?F (36.3 ?C)  ?SpO2: 99%  ? ?KPS: 90. ?General: Alert, cooperative, pleasant, in no acute distress ?Head: Normal ?EENT: No conjunctival injection or scleral icterus.  ?Lungs: Resp effort normal ?Cardiac: Regular rate ?Abdomen:  Non-distended abdomen ?Skin: No rashes cyanosis or petechiae. ?Extremities: No clubbing or edema ? ?Neurologic Exam: ?Mental Status: Awake, alert, attentive to examiner. Oriented to self and environment. Language is fluent

## 2022-02-14 ENCOUNTER — Other Ambulatory Visit (HOSPITAL_COMMUNITY): Payer: Self-pay

## 2022-02-14 ENCOUNTER — Telehealth: Payer: Self-pay | Admitting: Internal Medicine

## 2022-02-14 NOTE — Telephone Encounter (Signed)
Per 5/9 los called and spoke to pt about return appointment  pt confirmed appointment  ?

## 2022-02-19 DIAGNOSIS — Z760 Encounter for issue of repeat prescription: Secondary | ICD-10-CM | POA: Diagnosis not present

## 2022-02-20 ENCOUNTER — Other Ambulatory Visit (HOSPITAL_COMMUNITY): Payer: Self-pay

## 2022-02-20 ENCOUNTER — Other Ambulatory Visit: Payer: Self-pay | Admitting: Family Medicine

## 2022-02-20 DIAGNOSIS — I10 Essential (primary) hypertension: Secondary | ICD-10-CM

## 2022-02-20 MED ORDER — LOSARTAN POTASSIUM 50 MG PO TABS
75.0000 mg | ORAL_TABLET | Freq: Every day | ORAL | 0 refills | Status: DC
  Filled 2022-02-20: qty 135, 90d supply, fill #0

## 2022-02-21 ENCOUNTER — Other Ambulatory Visit (HOSPITAL_COMMUNITY): Payer: Self-pay

## 2022-02-21 NOTE — Progress Notes (Signed)
[facility] OFFICE PROGRESS NOTE  Bradley Goodman, [place] [address]  DIAGNOSIS: Stage IV (T1b, N3, M1C) non-small cell lung cancer, favoring adenocarcinoma presented with right upper lobe lung nodule in addition to right hilar, subcarinal and bilateral mediastinal as well as supraclavicular lymphadenopathy in addition to bone and brain metastasis diagnosed in June 2022.     PD-L1 expression 80%.     Molecular Studies:  Biomarker Findings Microsatellite status - MS-Stable Tumor Mutational Burden - 6 Muts/Mb Genomic Findings For a complete list of the genes assayed, please refer to the Appendix. KRAS G12C, amplification ATM S470* CCND1 amplification - equivocal? HGF amplification - equivocal? MYC amplification - equivocal? FGF19 amplification - equivocal? FGF3 amplification - equivocal? FGF4 amplification - equivocal? NFKBIA amplification NKX2-1 amplification RAD21 amplification - equivocal? [id number]*26 TERT promoter -124C>T TP53 rearrangement exon 9 7 Disease relevant genes with no reportable alterations: ALK, BRAF, EGFR, ERBB2, MET, RET, ROS1    PRIOR THERAPY: SRS to the metastatic brain lesions under the care of Dr. MTammi Klippel  Last treatment on 05/04/2021.    CURRENT THERAPY: : Palliative systemic chemotherapy with carboplatin for an AUC 5, Alimta 500 mg/m2 and, Keytruda 200 mg IV every 3 weeks.  First dose expected on 05/08/2021.  Status post 14 cycles.  Starting from cycle #5 he is on maintenance treatment with Alimta and Keytruda every 3 weeks.  INTERVAL HISTORY: Bradley Goodman 66y.o. male returns to the clinic today for a follow-up visit accompanied by his wife.  The patient is feeling fairly well today without any concerning complaints. He denies any fever, chills, night sweats, or unexplained weight loss. He denies any significant dyspnea on exertion out of the ordinary but notes that his breathing is "not what it used  to be". Denies any cough, chest pain, or hemoptysis.  Denies any nausea, vomiting, or constipation. He sometimes may have diarrhea which he attributes to his metformin and Ozempic.  Denies any unusual headache or visual changes The patient is followed closely by Dr. VMickeal Skinnerfor his history of metastatic disease to the brain.  He most recently had a repeat brain MRI performed earlier this month and a dedicated follow-up visit which started stable disease.  He also follows closely with endocrinology.  The patient is here today for evaluation and repeat blood work before starting cycle #15.    MEDICAL HISTORY: Past Medical History:  Diagnosis Date   Cervical spondylolysis    Essential hypertension    GERD (gastroesophageal reflux disease)    History of kidney stones    History of migraine    Hyperlipidemia    Hypertension    PONV (postoperative nausea and vomiting)    Type 2 diabetes mellitus (HCC)     ALLERGIES:  has No Known Allergies.  MEDICATIONS:  Current Outpatient Medications  Medication Sig Dispense Refill   cholecalciferol (VITAMIN D) 1000 UNITS tablet Take 1,000 Units by mouth every evening.     Continuous Blood Gluc Sensor (FREESTYLE LIBRE 3 SENSOR) MISC Place 1 sensor on the skin every 14 days. Use to check glucose continuously 2 each 2   dexamethasone (DECADRON) 1 MG tablet Take 2 tablets by mouth daily. 180 tablet 1   docusate sodium (COLACE) 100 MG capsule Take 100 mg by mouth daily.     folic acid (FOLVITE) 1 MG tablet Take 1 tablet by mouth daily. 30 tablet 4   Krill Oil 300 MG CAPS Take 300 mg by mouth every evening.  levothyroxine (SYNTHROID) 50 MCG tablet Take 1 tablet (50 mcg total) by mouth daily before breakfast. 90 tablet 1   loratadine (CLARITIN) 10 MG tablet Take 10 mg by mouth every evening.      losartan (COZAAR) 50 MG tablet Take 1 and 1/2 tablets (75 mg total) by mouth daily. 135 tablet 0   metFORMIN (GLUCOPHAGE XR) 500 MG 24 hr tablet Take 2 tablets by  mouth 2  times daily with a meal. (Patient taking differently: Take 1,000 mg by mouth daily with breakfast.) 360 tablet 1   mirtazapine (REMERON) 15 MG tablet Take 1 tablet by mouth at bedtime. 90 tablet 1   Multiple Vitamins-Minerals (MULTIVITAMIN GUMMIES ADULT PO) Take 2 each by mouth daily.     pantoprazole (PROTONIX) 40 MG tablet Take 1 tablet by mouth every evening. 90 tablet 1   PRESCRIPTION MEDICATION Testosterone injection     rosuvastatin (CRESTOR) 20 MG tablet Take 1 tablet by mouth daily. 90 tablet 1   Semaglutide,0.25 or 0.5MG/DOS, (OZEMPIC, 0.25 OR 0.5 MG/DOSE,) 2 MG/3ML SOPN Inject 0.5 mg into the skin once a week. 3 mL 2   SYRINGE-NEEDLE, DISP, 3 ML 21G X 1-1/2" 3 ML MISC Use to inject testosterone every week 100 each 2   venlafaxine XR (EFFEXOR-XR) 75 MG 24 hr capsule Take 1 capsule by mouth daily with breakfast. 90 capsule 1   prochlorperazine (COMPAZINE) 10 MG tablet Take 1 tablet (10 mg total) by mouth every 6 (six) hours as needed for nausea or vomiting. (Patient not taking: Reported on 02/05/2022) 30 tablet 0   Testosterone 20.25 MG/ACT (1.62%) GEL Apply 1 pump on each shoulder every morning (Patient not taking: Reported on 02/26/2022) 75 g 0   No current facility-administered medications for this visit.    SURGICAL HISTORY:  Past Surgical History:  Procedure Laterality Date   Bilateral inguinal hernia repair     BRONCHIAL NEEDLE ASPIRATION BIOPSY  04/05/2021   Procedure: BRONCHIAL NEEDLE ASPIRATION BIOPSIES;  Surgeon: Garner Nash, DO;  Location: [facility];  Service: Pulmonary;;   COLONOSCOPY  01/23/2012   Procedure: COLONOSCOPY;  Surgeon: Daneil Dolin, MD;  [facility];  Service: Endoscopy;  Laterality: N/A;  9:30 AM   COLONOSCOPY N/A 10/11/2015   Procedure: COLONOSCOPY;  Surgeon: Daneil Dolin, MD;  [facility];  Service: Endoscopy;  Laterality: N/A;  830   COLONOSCOPY N/A 10/14/2019   Procedure: COLONOSCOPY;  Surgeon: Daneil Dolin, MD;   [facility];  Service: Endoscopy;  Laterality: N/A;  1:45   POLYPECTOMY  10/14/2019   Procedure: POLYPECTOMY;  Surgeon: Daneil Dolin, MD;  [facility];  Service: Endoscopy;;  ascending colon, descending colon   VIDEO BRONCHOSCOPY WITH ENDOBRONCHIAL ULTRASOUND N/A 04/05/2021   Procedure: VIDEO BRONCHOSCOPY WITH ENDOBRONCHIAL ULTRASOUND;  Surgeon: Garner Nash, DO;  Location: [facility];  Service: Pulmonary;  Laterality: N/A;    REVIEW OF SYSTEMS:   Constitutional: Positive for baseline fatigue. Negative for appetite change, chills, fever and unexpected weight change.  HENT: Negative for mouth sores, nosebleeds, sore throat and trouble swallowing.   Eyes: Negative for eye problems and icterus.  Respiratory: Positive for occasional baseline shortness of breath. Negative for cough, hemoptysis, and wheezing.   Cardiovascular: Negative for chest pain and leg swelling.  Gastrointestinal: Positive for occasional diarrhea. Negative for abdominal pain, constipation, nausea and vomiting.  Genitourinary: Negative for bladder incontinence, difficulty urinating, dysuria, frequency and hematuria.   Musculoskeletal: Negative for back pain,  gait problem, neck pain and neck stiffness.  Skin: Negative for itching and rash.  Neurological: Negative for dizziness, extremity weakness, gait problem, headaches, light-headedness and seizures.  Hematological: Negative for adenopathy. Does not bruise/bleed easily.  Psychiatric/Behavioral: Negative for confusion, depression and sleep disturbance. The patient is not nervous/anxious.     PHYSICAL EXAMINATION:  Blood pressure 134/90, pulse (!) 103, temperature 97.8 F (36.6 C), temperature source Temporal, resp. rate 19, height 6' 1" (1.854 m), weight 187 lb 11.2 oz (85.1 kg), SpO2 100 %.  ECOG PERFORMANCE STATUS: 1  Physical Exam  Constitutional: Oriented to person, place, and time and well-developed, well-nourished, and in no distress.  No distress.  HENT:  Head: Normocephalic and atraumatic.  Mouth/Throat: Oropharynx is clear and moist. No oropharyngeal exudate.  Eyes: Conjunctivae are normal. Right eye exhibits no discharge. Left eye exhibits no discharge. No scleral icterus.  Neck: Normal range of motion. Neck supple.  Cardiovascular: Normal rate, regular rhythm, normal heart sounds and intact distal pulses.   Pulmonary/Chest: Effort normal and breath sounds normal. No respiratory distress. No wheezes. No rales.  Abdominal: Soft. Bowel sounds are normal. Exhibits no distension and no mass. There is no tenderness.  Musculoskeletal: Normal range of motion. Exhibits no edema.  Lymphadenopathy:    No cervical adenopathy.  Neurological: Alert and oriented to person, place, and time. Exhibits normal muscle tone. Gait normal. Coordination normal.  Skin: Skin is warm and dry. No rash noted. Not diaphoretic. No erythema. No pallor.  Psychiatric: Mood, memory and judgment normal.  Vitals reviewed.  LABORATORY DATA: Lab Results  Component Value Date   WBC 10.8 (H) 02/26/2022   HGB 12.4 (L) 02/26/2022   HCT 39.0 02/26/2022   MCV 93.8 02/26/2022   PLT 226 02/26/2022      Chemistry      Component Value Date/Time   NA 141 02/26/2022 1024   NA 144 01/10/2021 0849   K 3.7 02/26/2022 1024   CL 105 02/26/2022 1024   CO2 31 02/26/2022 1024   BUN 16 02/26/2022 1024   BUN 15 01/10/2021 0849   CREATININE 0.97 02/26/2022 1024      Component Value Date/Time   CALCIUM 9.1 02/26/2022 1024   ALKPHOS 58 02/26/2022 1024   AST 17 02/26/2022 1024   ALT 23 02/26/2022 1024   BILITOT 0.3 02/26/2022 1024       RADIOGRAPHIC STUDIES:  CT Chest W Contrast  Result Date: 01/31/2022 CLINICAL DATA:  66 year old male with history of metastatic non-small cell lung cancer. Follow-up study. * Tracking Code: BO * EXAM: CT CHEST, ABDOMEN, AND PELVIS WITH CONTRAST TECHNIQUE: Multidetector CT imaging of the chest, abdomen and pelvis was  performed following the standard protocol during bolus administration of intravenous contrast. RADIATION DOSE REDUCTION: This exam was performed according to the departmental dose-optimization program which includes automated exposure control, adjustment of the mA and/or kV according to patient size and/or use of iterative reconstruction technique. CONTRAST:  149m OMNIPAQUE IOHEXOL 300 MG/ML  SOLN COMPARISON:  CT the chest, abdomen and pelvis 11/27/2021. FINDINGS: CT CHEST FINDINGS Cardiovascular: Heart size is normal. There is no significant pericardial fluid, thickening or pericardial calcification. There is aortic atherosclerosis, as well as atherosclerosis of the great vessels of the mediastinum and the coronary arteries, including calcified atherosclerotic plaque in the left main and left anterior descending coronary arteries. Mediastinum/Nodes: No pathologically enlarged mediastinal or hilar lymph nodes. Esophagus is unremarkable in appearance. No axillary lymphadenopathy. Lungs/Pleura: Several small pulmonary nodules are again noted scattered throughout  the lungs bilaterally, generally stable compared to the prior examination. The exceptions include a small 4 mm subpleural nodule in the anterior left upper lobe (axial image 66 of series 6) and a 8 x 4 mm (mean diameter of 6 mm) right upper lobe pulmonary nodule (axial image 53 of series 6), both of which are new compared to the prior study. No other larger more suspicious appearing pulmonary nodules or masses are noted. No acute consolidative airspace disease. No pleural effusions. Musculoskeletal: There are no aggressive appearing lytic or blastic lesions noted in the visualized portions of the skeleton. CT ABDOMEN PELVIS FINDINGS Hepatobiliary: Diffuse low attenuation throughout the hepatic parenchyma, indicative of a background of hepatic steatosis. 1.3 x 1.0 cm hypovascular lesion in segment 2 of the liver (axial image 52 of series 2), stable, likely  benign. Subcentimeter low-attenuation lesion in segment 6 of the liver (axial image 73 of series 2), too small to characterize, but also stable compared to the prior examination, favored to represent a cyst. No other new suspicious hepatic lesions. No intra or extrahepatic biliary ductal dilatation. Gallbladder is normal in appearance. Pancreas: No pancreatic mass. No pancreatic ductal dilatation. No pancreatic or peripancreatic fluid collections or inflammatory changes. Spleen: Unremarkable. Adrenals/Urinary Tract: 1 cm nonobstructive calculus in the upper pole collecting system of the left kidney. Right kidney and bilateral adrenal glands are normal in appearance. No hydroureteronephrosis. Urinary bladder is normal in appearance. Stomach/Bowel: The appearance of the stomach is normal. There is no pathologic dilatation of small bowel or colon. A few scattered colonic diverticulae are noted, without surrounding inflammatory changes to indicate an acute diverticulitis at this time. Normal appendix. Vascular/Lymphatic: Atherosclerotic calcifications throughout the abdominal aorta and pelvic vasculature. No aneurysm or dissection noted in the abdominal or pelvis. No lymphadenopathy noted in the abdomen or pelvis. Reproductive: Prostate gland and seminal vesicles are unremarkable in appearance. Other: No significant volume of ascites.  No pneumoperitoneum. Musculoskeletal: There are no aggressive appearing lytic or blastic lesions noted in the visualized portions of the skeleton. IMPRESSION: 1. Small pulmonary nodules in the lungs bilaterally, most of which are stable compared to the prior examination, with a couple of exceptions (described above), largest of which is in the right upper lobe with a mean diameter of 6 mm. Attention on follow-up imaging is recommended to ensure stability or regression. 2. Stable findings in the abdomen or pelvis without definitive evidence to suggest metastatic disease to the abdomen or  pelvis. 3. Aortic atherosclerosis, in addition to left main and left anterior descending coronary artery disease. Please note that although the presence of coronary artery calcium documents the presence of coronary artery disease, the severity of this disease and any potential stenosis cannot be assessed on this non-gated CT examination. Assessment for potential risk factor modification, dietary therapy or pharmacologic therapy may be warranted, if clinically indicated. 4. Hepatic steatosis. 5. 1 cm nonobstructive calculus in the upper pole collecting system of left kidney. 6. Colonic diverticulosis without evidence of acute diverticulitis at this time. 7. Additional incidental findings, as above. Electronically Signed   By: Vinnie Langton M.D.   On: 01/31/2022 09:24   MR BRAIN W WO CONTRAST  Result Date: 02/10/2022 CLINICAL DATA:  Brain/CNS neoplasm, assess treatment response. [facility]. History of lung carcinoma. Brain radiation. EXAM: MRI HEAD WITHOUT AND WITH CONTRAST TECHNIQUE: Multiplanar, multiecho pulse sequences of the brain and surrounding structures were obtained without and with intravenous contrast. CONTRAST:  70m MULTIHANCE GADOBENATE DIMEGLUMINE 529 MG/ML IV SOLN COMPARISON:  None Available. FINDINGS: Brain: No acute infarct, mass effect or extra-axial collection. No acute or chronic hemorrhage. There is multifocal hyperintense T2-weighted signal within the white matter. Parenchymal volume and CSF spaces are normal. Unchanged punctate contrast enhancing lesions of the posterior right parietal and left cerebellar lesions (series 11 images 46 and 91). Unchanged appearance of the pituitary stalk. Vascular: Major flow voids are preserved. Skull and upper cervical spine: Normal calvarium and skull base. Visualized upper cervical spine and soft tissues are normal. Sinuses/Orbits:No paranasal sinus fluid levels or advanced mucosal thickening. No mastoid or middle ear effusion. Normal orbits.  IMPRESSION: Unchanged appearance of punctate contrast enhancing lesions of the posterior right parietal and left cerebellar lesions. No new lesions. Electronically Signed   By: Ulyses Jarred M.D.   On: 02/10/2022 19:22   CT Abdomen Pelvis W Contrast  Result Date: 01/31/2022 CLINICAL DATA:  66 year old male with history of metastatic non-small cell lung cancer. Follow-up study. * Tracking Code: BO * EXAM: CT CHEST, ABDOMEN, AND PELVIS WITH CONTRAST TECHNIQUE: Multidetector CT imaging of the chest, abdomen and pelvis was performed following the standard protocol during bolus administration of intravenous contrast. RADIATION DOSE REDUCTION: This exam was performed according to the departmental dose-optimization program which includes automated exposure control, adjustment of the mA and/or kV according to patient size and/or use of iterative reconstruction technique. CONTRAST:  159m OMNIPAQUE IOHEXOL 300 MG/ML  SOLN COMPARISON:  CT the chest, abdomen and pelvis 11/27/2021. FINDINGS: CT CHEST FINDINGS Cardiovascular: Heart size is normal. There is no significant pericardial fluid, thickening or pericardial calcification. There is aortic atherosclerosis, as well as atherosclerosis of the great vessels of the mediastinum and the coronary arteries, including calcified atherosclerotic plaque in the left main and left anterior descending coronary arteries. Mediastinum/Nodes: No pathologically enlarged mediastinal or hilar lymph nodes. Esophagus is unremarkable in appearance. No axillary lymphadenopathy. Lungs/Pleura: Several small pulmonary nodules are again noted scattered throughout the lungs bilaterally, generally stable compared to the prior examination. The exceptions include a small 4 mm subpleural nodule in the anterior left upper lobe (axial image 66 of series 6) and a 8 x 4 mm (mean diameter of 6 mm) right upper lobe pulmonary nodule (axial image 53 of series 6), both of which are new compared to the prior  study. No other larger more suspicious appearing pulmonary nodules or masses are noted. No acute consolidative airspace disease. No pleural effusions. Musculoskeletal: There are no aggressive appearing lytic or blastic lesions noted in the visualized portions of the skeleton. CT ABDOMEN PELVIS FINDINGS Hepatobiliary: Diffuse low attenuation throughout the hepatic parenchyma, indicative of a background of hepatic steatosis. 1.3 x 1.0 cm hypovascular lesion in segment 2 of the liver (axial image 52 of series 2), stable, likely benign. Subcentimeter low-attenuation lesion in segment 6 of the liver (axial image 73 of series 2), too small to characterize, but also stable compared to the prior examination, favored to represent a cyst. No other new suspicious hepatic lesions. No intra or extrahepatic biliary ductal dilatation. Gallbladder is normal in appearance. Pancreas: No pancreatic mass. No pancreatic ductal dilatation. No pancreatic or peripancreatic fluid collections or inflammatory changes. Spleen: Unremarkable. Adrenals/Urinary Tract: 1 cm nonobstructive calculus in the upper pole collecting system of the left kidney. Right kidney and bilateral adrenal glands are normal in appearance. No hydroureteronephrosis. Urinary bladder is normal in appearance. Stomach/Bowel: The appearance of the stomach is normal. There is no pathologic dilatation of small bowel or colon. A few scattered colonic diverticulae are noted, without  surrounding inflammatory changes to indicate an acute diverticulitis at this time. Normal appendix. Vascular/Lymphatic: Atherosclerotic calcifications throughout the abdominal aorta and pelvic vasculature. No aneurysm or dissection noted in the abdominal or pelvis. No lymphadenopathy noted in the abdomen or pelvis. Reproductive: Prostate gland and seminal vesicles are unremarkable in appearance. Other: No significant volume of ascites.  No pneumoperitoneum. Musculoskeletal: There are no aggressive  appearing lytic or blastic lesions noted in the visualized portions of the skeleton. IMPRESSION: 1. Small pulmonary nodules in the lungs bilaterally, most of which are stable compared to the prior examination, with a couple of exceptions (described above), largest of which is in the right upper lobe with a mean diameter of 6 mm. Attention on follow-up imaging is recommended to ensure stability or regression. 2. Stable findings in the abdomen or pelvis without definitive evidence to suggest metastatic disease to the abdomen or pelvis. 3. Aortic atherosclerosis, in addition to left main and left anterior descending coronary artery disease. Please note that although the presence of coronary artery calcium documents the presence of coronary artery disease, the severity of this disease and any potential stenosis cannot be assessed on this non-gated CT examination. Assessment for potential risk factor modification, dietary therapy or pharmacologic therapy may be warranted, if clinically indicated. 4. Hepatic steatosis. 5. 1 cm nonobstructive calculus in the upper pole collecting system of left kidney. 6. Colonic diverticulosis without evidence of acute diverticulitis at this time. 7. Additional incidental findings, as above. Electronically Signed   By: Vinnie Langton M.D.   On: 01/31/2022 09:24     ASSESSMENT/PLAN:  This is a very pleasant 66 year old Caucasian male diagnosed with stage IV (T1b, N3, M1 C) non-small cell lung cancer, favoring adenocarcinoma.  The patient presented with a right upper lobe lung nodule in addition to right hilar, subcarinal, and bilateral mediastinal lymphadenopathy as well as supraclavicular lymphadenopathy.  The patient also has metastatic disease to the brain.  He was diagnosed in June 2022.  His PD-L1 expression is 80%.  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   The patient  completed SRS to the brain lesions under the care of Dr. Tammi Klippel.  His last treatment was on 05/04/2021. 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    The patient is currently undergoing systemic chemotherapy with carboplatin for an AUC of 5, Alimta 500 mg per metered squared, Keytruda 200 mg IV every 3 weeks.  Status post 14 cycles. Starting from cycle #5, the patient started maintenance Alimta and Keytruda IV every 3 weeks.     Labs were reviewed.His TSH is pending. Recommend that he proceed with cycle #15 today scheduled.   We will see the patient back for follow-up visit in 3 weeks for evaluation and repeat blood work.  The patient had a repeat scan at his last appointment which showed slightly enlarging pulmonary nodules which we will continue to monitor closely on subsequent imaging.  We will likely arrange for restaging scan at his next appointment.  The patient was advised to call immediately if he has any concerning symptoms in the interval. The patient voices understanding of current disease status and treatment options and is in agreement with the current care plan. All questions were answered. The patient knows to call the clinic with any problems, questions or concerns. We can certainly see the patient much sooner if necessary  No orders of the defined types were placed in this encounter.    The total time spent in the appointment was 20-29 minutes.   Emmery Seiler L Diann Bangerter, PA-C 02/26/22

## 2022-02-26 ENCOUNTER — Other Ambulatory Visit: Payer: Self-pay

## 2022-02-26 ENCOUNTER — Encounter: Payer: Self-pay | Admitting: Physician Assistant

## 2022-02-26 ENCOUNTER — Inpatient Hospital Stay: Payer: 59

## 2022-02-26 ENCOUNTER — Inpatient Hospital Stay (HOSPITAL_BASED_OUTPATIENT_CLINIC_OR_DEPARTMENT_OTHER): Payer: 59 | Admitting: Physician Assistant

## 2022-02-26 VITALS — HR 89

## 2022-02-26 VITALS — BP 134/90 | HR 103 | Temp 97.8°F | Resp 19 | Ht 73.0 in | Wt 187.7 lb

## 2022-02-26 DIAGNOSIS — Z79899 Other long term (current) drug therapy: Secondary | ICD-10-CM | POA: Diagnosis not present

## 2022-02-26 DIAGNOSIS — Z5111 Encounter for antineoplastic chemotherapy: Secondary | ICD-10-CM | POA: Diagnosis not present

## 2022-02-26 DIAGNOSIS — Z5112 Encounter for antineoplastic immunotherapy: Secondary | ICD-10-CM | POA: Diagnosis not present

## 2022-02-26 DIAGNOSIS — C3411 Malignant neoplasm of upper lobe, right bronchus or lung: Secondary | ICD-10-CM | POA: Diagnosis not present

## 2022-02-26 DIAGNOSIS — C7951 Secondary malignant neoplasm of bone: Secondary | ICD-10-CM | POA: Diagnosis not present

## 2022-02-26 DIAGNOSIS — C3491 Malignant neoplasm of unspecified part of right bronchus or lung: Secondary | ICD-10-CM | POA: Diagnosis not present

## 2022-02-26 DIAGNOSIS — C7931 Secondary malignant neoplasm of brain: Secondary | ICD-10-CM | POA: Diagnosis not present

## 2022-02-26 DIAGNOSIS — Z7984 Long term (current) use of oral hypoglycemic drugs: Secondary | ICD-10-CM | POA: Diagnosis not present

## 2022-02-26 DIAGNOSIS — E538 Deficiency of other specified B group vitamins: Secondary | ICD-10-CM | POA: Diagnosis not present

## 2022-02-26 DIAGNOSIS — E119 Type 2 diabetes mellitus without complications: Secondary | ICD-10-CM | POA: Diagnosis not present

## 2022-02-26 LAB — CMP (CANCER CENTER ONLY)
ALT: 23 U/L (ref 0–44)
AST: 17 U/L (ref 15–41)
Albumin: 3.8 g/dL (ref 3.5–5.0)
Alkaline Phosphatase: 58 U/L (ref 38–126)
Anion gap: 5 (ref 5–15)
BUN: 16 mg/dL (ref 8–23)
CO2: 31 mmol/L (ref 22–32)
Calcium: 9.1 mg/dL (ref 8.9–10.3)
Chloride: 105 mmol/L (ref 98–111)
Creatinine: 0.97 mg/dL (ref 0.61–1.24)
GFR, Estimated: >60 mL/min (ref >60)
Glucose, Bld: 143 mg/dL — ABNORMAL HIGH (ref 70–99)
Potassium: 3.7 mmol/L (ref 3.5–5.1)
Sodium: 141 mmol/L (ref 135–145)
Total Bilirubin: 0.3 mg/dL (ref 0.3–1.2)
Total Protein: 6.4 g/dL — ABNORMAL LOW (ref 6.5–8.1)

## 2022-02-26 LAB — CBC WITH DIFFERENTIAL (CANCER CENTER ONLY)
Abs Immature Granulocytes: 0.11 10*3/uL — ABNORMAL HIGH (ref 0.00–0.07)
Basophils Absolute: 0 10*3/uL (ref 0.0–0.1)
Basophils Relative: 0 %
Eosinophils Absolute: 0.1 10*3/uL (ref 0.0–0.5)
Eosinophils Relative: 1 %
HCT: 39 % (ref 39.0–52.0)
Hemoglobin: 12.4 g/dL — ABNORMAL LOW (ref 13.0–17.0)
Immature Granulocytes: 1 %
Lymphocytes Relative: 16 %
Lymphs Abs: 1.7 10*3/uL (ref 0.7–4.0)
MCH: 29.8 pg (ref 26.0–34.0)
MCHC: 31.8 g/dL (ref 30.0–36.0)
MCV: 93.8 fL (ref 80.0–100.0)
Monocytes Absolute: 0.6 10*3/uL (ref 0.1–1.0)
Monocytes Relative: 6 %
Neutro Abs: 8.2 10*3/uL — ABNORMAL HIGH (ref 1.7–7.7)
Neutrophils Relative %: 76 %
Platelet Count: 226 10*3/uL (ref 150–400)
RBC: 4.16 MIL/uL — ABNORMAL LOW (ref 4.22–5.81)
RDW: 16.8 % — ABNORMAL HIGH (ref 11.5–15.5)
WBC Count: 10.8 10*3/uL — ABNORMAL HIGH (ref 4.0–10.5)
nRBC: 0 % (ref 0.0–0.2)

## 2022-02-26 LAB — TSH: TSH: 0.642 u[IU]/mL (ref 0.350–4.500)

## 2022-02-26 MED ORDER — PROCHLORPERAZINE MALEATE 10 MG PO TABS
10.0000 mg | ORAL_TABLET | Freq: Once | ORAL | Status: AC
  Administered 2022-02-26: 10 mg via ORAL
  Filled 2022-02-26: qty 1

## 2022-02-26 MED ORDER — SODIUM CHLORIDE 0.9 % IV SOLN
200.0000 mg | Freq: Once | INTRAVENOUS | Status: AC
  Administered 2022-02-26: 200 mg via INTRAVENOUS
  Filled 2022-02-26: qty 200

## 2022-02-26 MED ORDER — SODIUM CHLORIDE 0.9 % IV SOLN
500.0000 mg/m2 | Freq: Once | INTRAVENOUS | Status: AC
  Administered 2022-02-26: 1000 mg via INTRAVENOUS
  Filled 2022-02-26: qty 40

## 2022-02-26 MED ORDER — SODIUM CHLORIDE 0.9 % IV SOLN: Freq: Once | INTRAVENOUS | Status: DC

## 2022-02-26 MED ORDER — CYANOCOBALAMIN 1000 MCG/ML IJ SOLN
1000.0000 ug | Freq: Once | INTRAMUSCULAR | Status: AC
  Administered 2022-02-26: 1000 ug via INTRAMUSCULAR
  Filled 2022-02-26: qty 1

## 2022-03-02 ENCOUNTER — Other Ambulatory Visit (HOSPITAL_COMMUNITY): Payer: Self-pay

## 2022-03-07 ENCOUNTER — Other Ambulatory Visit (HOSPITAL_COMMUNITY): Payer: Self-pay

## 2022-03-07 ENCOUNTER — Other Ambulatory Visit: Payer: Self-pay | Admitting: Internal Medicine

## 2022-03-07 MED ORDER — FOLIC ACID 1 MG PO TABS
1.0000 mg | ORAL_TABLET | Freq: Every day | ORAL | 4 refills | Status: DC
  Filled 2022-03-07: qty 30, 30d supply, fill #0
  Filled 2022-04-03: qty 30, 30d supply, fill #1
  Filled 2022-05-01: qty 30, 30d supply, fill #2
  Filled 2022-06-01: qty 30, 30d supply, fill #3
  Filled 2022-07-04: qty 30, 30d supply, fill #4

## 2022-03-08 ENCOUNTER — Other Ambulatory Visit (HOSPITAL_COMMUNITY): Payer: Self-pay

## 2022-03-09 ENCOUNTER — Other Ambulatory Visit (HOSPITAL_COMMUNITY): Payer: Self-pay

## 2022-03-19 ENCOUNTER — Encounter: Payer: Self-pay | Admitting: Internal Medicine

## 2022-03-19 ENCOUNTER — Other Ambulatory Visit: Payer: Self-pay

## 2022-03-19 ENCOUNTER — Inpatient Hospital Stay: Payer: 59 | Attending: Physician Assistant | Admitting: Internal Medicine

## 2022-03-19 ENCOUNTER — Inpatient Hospital Stay: Payer: 59

## 2022-03-19 VITALS — BP 152/94 | HR 108 | Temp 97.9°F | Resp 18 | Wt 189.1 lb

## 2022-03-19 DIAGNOSIS — E119 Type 2 diabetes mellitus without complications: Secondary | ICD-10-CM | POA: Diagnosis not present

## 2022-03-19 DIAGNOSIS — C7931 Secondary malignant neoplasm of brain: Secondary | ICD-10-CM | POA: Insufficient documentation

## 2022-03-19 DIAGNOSIS — C3411 Malignant neoplasm of upper lobe, right bronchus or lung: Secondary | ICD-10-CM | POA: Diagnosis not present

## 2022-03-19 DIAGNOSIS — Z7984 Long term (current) use of oral hypoglycemic drugs: Secondary | ICD-10-CM | POA: Insufficient documentation

## 2022-03-19 DIAGNOSIS — Z79899 Other long term (current) drug therapy: Secondary | ICD-10-CM | POA: Diagnosis not present

## 2022-03-19 DIAGNOSIS — E538 Deficiency of other specified B group vitamins: Secondary | ICD-10-CM | POA: Insufficient documentation

## 2022-03-19 DIAGNOSIS — C7951 Secondary malignant neoplasm of bone: Secondary | ICD-10-CM | POA: Insufficient documentation

## 2022-03-19 DIAGNOSIS — Z5112 Encounter for antineoplastic immunotherapy: Secondary | ICD-10-CM | POA: Diagnosis not present

## 2022-03-19 DIAGNOSIS — Z5111 Encounter for antineoplastic chemotherapy: Secondary | ICD-10-CM | POA: Diagnosis not present

## 2022-03-19 DIAGNOSIS — Z923 Personal history of irradiation: Secondary | ICD-10-CM | POA: Diagnosis not present

## 2022-03-19 DIAGNOSIS — C3491 Malignant neoplasm of unspecified part of right bronchus or lung: Secondary | ICD-10-CM | POA: Diagnosis not present

## 2022-03-19 DIAGNOSIS — F17210 Nicotine dependence, cigarettes, uncomplicated: Secondary | ICD-10-CM | POA: Diagnosis not present

## 2022-03-19 LAB — CMP (CANCER CENTER ONLY)
ALT: 31 U/L (ref 0–44)
AST: 20 U/L (ref 15–41)
Albumin: 4.1 g/dL (ref 3.5–5.0)
Alkaline Phosphatase: 67 U/L (ref 38–126)
Anion gap: 7 (ref 5–15)
BUN: 17 mg/dL (ref 8–23)
CO2: 28 mmol/L (ref 22–32)
Calcium: 9.4 mg/dL (ref 8.9–10.3)
Chloride: 106 mmol/L (ref 98–111)
Creatinine: 1.12 mg/dL (ref 0.61–1.24)
GFR, Estimated: >60 mL/min (ref >60)
Glucose, Bld: 167 mg/dL — ABNORMAL HIGH (ref 70–99)
Potassium: 3.6 mmol/L (ref 3.5–5.1)
Sodium: 141 mmol/L (ref 135–145)
Total Bilirubin: 0.3 mg/dL (ref 0.3–1.2)
Total Protein: 6.8 g/dL (ref 6.5–8.1)

## 2022-03-19 LAB — TSH: TSH: 0.593 u[IU]/mL (ref 0.350–4.500)

## 2022-03-19 LAB — CBC WITH DIFFERENTIAL (CANCER CENTER ONLY)
Abs Immature Granulocytes: 0.1 10*3/uL — ABNORMAL HIGH (ref 0.00–0.07)
Basophils Absolute: 0.1 10*3/uL (ref 0.0–0.1)
Basophils Relative: 1 %
Eosinophils Absolute: 0.1 10*3/uL (ref 0.0–0.5)
Eosinophils Relative: 1 %
HCT: 42.8 % (ref 39.0–52.0)
Hemoglobin: 13.4 g/dL (ref 13.0–17.0)
Immature Granulocytes: 1 %
Lymphocytes Relative: 15 %
Lymphs Abs: 1.7 10*3/uL (ref 0.7–4.0)
MCH: 29.4 pg (ref 26.0–34.0)
MCHC: 31.3 g/dL (ref 30.0–36.0)
MCV: 93.9 fL (ref 80.0–100.0)
Monocytes Absolute: 0.6 10*3/uL (ref 0.1–1.0)
Monocytes Relative: 5 %
Neutro Abs: 8.9 10*3/uL — ABNORMAL HIGH (ref 1.7–7.7)
Neutrophils Relative %: 77 %
Platelet Count: 247 10*3/uL (ref 150–400)
RBC: 4.56 MIL/uL (ref 4.22–5.81)
RDW: 17 % — ABNORMAL HIGH (ref 11.5–15.5)
WBC Count: 11.5 10*3/uL — ABNORMAL HIGH (ref 4.0–10.5)
nRBC: 0 % (ref 0.0–0.2)

## 2022-03-19 MED ORDER — SODIUM CHLORIDE 0.9 % IV SOLN
500.0000 mg/m2 | Freq: Once | INTRAVENOUS | Status: AC
  Administered 2022-03-19: 1000 mg via INTRAVENOUS
  Filled 2022-03-19: qty 40

## 2022-03-19 MED ORDER — PROCHLORPERAZINE MALEATE 10 MG PO TABS
10.0000 mg | ORAL_TABLET | Freq: Once | ORAL | Status: AC
  Administered 2022-03-19: 10 mg via ORAL
  Filled 2022-03-19: qty 1

## 2022-03-19 MED ORDER — SODIUM CHLORIDE 0.9 % IV SOLN: Freq: Once | INTRAVENOUS | Status: AC

## 2022-03-19 MED ORDER — SODIUM CHLORIDE 0.9 % IV SOLN
200.0000 mg | Freq: Once | INTRAVENOUS | Status: AC
  Administered 2022-03-19: 200 mg via INTRAVENOUS
  Filled 2022-03-19: qty 200

## 2022-03-19 NOTE — Patient Instructions (Signed)
[facility]  Discharge Instructions: Thank you for choosing [facility] to provide your oncology and hematology care.   If you have a lab appointment with the [facility], please go directly to the [facility] and check in at the registration area.   Wear comfortable clothing and clothing appropriate for easy access to any Portacath or PICC line.   We strive to give you quality time with your provider. You may need to reschedule your appointment if you arrive late (15 or more minutes).  Arriving late affects you and other patients whose appointments are after yours.  Also, if you miss three or more appointments without notifying the office, you may be dismissed from the clinic at the provider's discretion.      For prescription refill requests, have your pharmacy contact our office and allow 72 hours for refills to be completed.    Today you received the following chemotherapy and/or immunotherapy agents: Pembroblizumab, Pemetrexed.       To help prevent nausea and vomiting after your treatment, we encourage you to take your nausea medication as directed.  BELOW ARE SYMPTOMS THAT SHOULD BE REPORTED IMMEDIATELY: *FEVER GREATER THAN 100.4 F (38 C) OR HIGHER *CHILLS OR SWEATING *NAUSEA AND VOMITING THAT IS NOT CONTROLLED WITH YOUR NAUSEA MEDICATION *UNUSUAL SHORTNESS OF BREATH *UNUSUAL BRUISING OR BLEEDING *URINARY PROBLEMS (pain or burning when urinating, or frequent urination) *BOWEL PROBLEMS (unusual diarrhea, constipation, pain near the anus) TENDERNESS IN MOUTH AND THROAT WITH OR WITHOUT PRESENCE OF ULCERS (sore throat, sores in mouth, or a toothache) UNUSUAL RASH, SWELLING OR PAIN  UNUSUAL VAGINAL DISCHARGE OR ITCHING   Items with * indicate a potential emergency and should be followed up as soon as possible or go to the Emergency Department if any problems should occur.  Please show the CHEMOTHERAPY ALERT CARD or IMMUNOTHERAPY ALERT  CARD at check-in to the Emergency Department and triage nurse.  Should you have questions after your visit or need to cancel or reschedule your appointment, please contact [facility]  Dept: [phone]  and follow the prompts.  Office hours are 8:00 a.m. to 4:30 p.m. Monday - Friday. Please note that voicemails left after 4:00 p.m. may not be returned until the following business day.  We are closed weekends and major holidays. You have access to a nurse at all times for urgent questions. Please call the main number to the clinic Dept: [phone] and follow the prompts.   For any non-urgent questions, you may also contact your provider using MyChart. We now offer e-Visits for anyone 105 and older to request care online for non-urgent symptoms. For details visit [email or web address].   Also download the MyChart app! Go to the app store, search "MyChart", open the app, select [facility], and log in with your MyChart username and password.  Due to Covid, a mask is required upon entering the hospital/clinic. If you do not have a mask, one will be given to you upon arrival. For doctor visits, patients may have 1 support person aged 37 or older with them. For treatment visits, patients cannot have anyone with them due to current Covid guidelines and our immunocompromised population.

## 2022-03-19 NOTE — Progress Notes (Signed)
Per Dr. Julien Nordmann , it is ok to treat pt today with Alimta and Keytruda and heart rate of 108.

## 2022-03-19 NOTE — Progress Notes (Signed)
[facility] [phone]   [phone]  OFFICE PROGRESS NOTE  Bradley Goodman, [place] [address]  DIAGNOSIS: Stage IV (T1b, N3, M1C) non-small cell lung cancer, favoring adenocarcinoma presented with right upper lobe lung nodule in addition to right hilar, subcarinal and bilateral mediastinal as well as supraclavicular lymphadenopathy in addition to bone and brain metastasis diagnosed in June 2022.     PD-L1 expression 80%.     Molecular Studies:  Biomarker Findings Microsatellite status - MS-Stable Tumor Mutational Burden - 6 Muts/Mb Genomic Findings For a complete list of the genes assayed, please refer to the Appendix. KRAS G12C, amplification ATM S470* CCND1 amplification - equivocal? HGF amplification - equivocal? MYC amplification - equivocal? FGF19 amplification - equivocal? FGF3 amplification - equivocal? FGF4 amplification - equivocal? NFKBIA amplification NKX2-1 amplification RAD21 amplification - equivocal? [id number]*26 TERT promoter -124C>T TP53 rearrangement exon 9 7 Disease relevant genes with no reportable alterations: ALK, BRAF, EGFR, ERBB2, MET, RET, ROS1   PRIOR THERAPY: SRS to the metastatic brain lesions under the care of Dr. MTammi Klippel  Last treatment on 05/04/2021.   CURRENT THERAPY: Palliative systemic chemotherapy with carboplatin for an AUC 5, Alimta 500 mg/m2 and, Keytruda 200 mg IV every 3 weeks.  First dose expected on 05/08/2021.  Status post 15 cycles.  Starting from cycle #5 he is on maintenance treatment with Alimta and Keytruda every 3 weeks.  INTERVAL HISTORY: Bradley Goodman 66y.o. male returns to the clinic today for follow-up visit accompanied by his wife.  The patient is feeling fine today with no concerning complaints except for some bruises on his upper extremities after fixing his more this weekend.  He denied having any chest pain, shortness of breath, cough or  hemoptysis.  He has no nausea, vomiting, diarrhea or constipation.  He has no headache or visual changes.  He has no weight loss or night sweats.  He is here today for evaluation before starting cycle #16 of his treatment. MEDICAL HISTORY: Past Medical History:  Diagnosis Date   Cervical spondylolysis    Essential hypertension    GERD (gastroesophageal reflux disease)    History of kidney stones    History of migraine    Hyperlipidemia    Hypertension    PONV (postoperative nausea and vomiting)    Type 2 diabetes mellitus (HCC)     ALLERGIES:  has No Known Allergies.  MEDICATIONS:  Current Outpatient Medications  Medication Sig Dispense Refill   cholecalciferol (VITAMIN D) 1000 UNITS tablet Take 1,000 Units by mouth every evening.     Continuous Blood Gluc Sensor (FREESTYLE LIBRE 3 SENSOR) MISC Place 1 sensor on the skin every 14 days. Use to check glucose continuously 2 each 2   dexamethasone (DECADRON) 1 MG tablet Take 2 tablets by mouth daily. 180 tablet 1   docusate sodium (COLACE) 100 MG capsule Take 100 mg by mouth daily.     folic acid (FOLVITE) 1 MG tablet Take 1 tablet by mouth daily. 30 tablet 4   Krill Oil 300 MG CAPS Take 300 mg by mouth every evening.     levothyroxine (SYNTHROID) 50 MCG tablet Take 1 tablet (50 mcg total) by mouth daily before breakfast. 90 tablet 1   loratadine (CLARITIN) 10 MG tablet Take 10 mg by mouth every evening.      losartan (COZAAR) 50 MG tablet Take 1 and 1/2 tablets (75 mg total) by mouth daily. 135 tablet 0  metFORMIN (GLUCOPHAGE XR) 500 MG 24 hr tablet Take 2 tablets by mouth 2  times daily with a meal. (Patient taking differently: Take 1,000 mg by mouth daily with breakfast.) 360 tablet 1   mirtazapine (REMERON) 15 MG tablet Take 1 tablet by mouth at bedtime. 90 tablet 1   Multiple Vitamins-Minerals (MULTIVITAMIN GUMMIES ADULT PO) Take 2 each by mouth daily.     pantoprazole (PROTONIX) 40 MG tablet Take 1 tablet by mouth every evening. 90  tablet 1   PRESCRIPTION MEDICATION Testosterone injection     prochlorperazine (COMPAZINE) 10 MG tablet Take 1 tablet (10 mg total) by mouth every 6 (six) hours as needed for nausea or vomiting. (Patient not taking: Reported on 02/05/2022) 30 tablet 0   rosuvastatin (CRESTOR) 20 MG tablet Take 1 tablet by mouth daily. 90 tablet 1   Semaglutide,0.25 or 0.5MG/DOS, (OZEMPIC, 0.25 OR 0.5 MG/DOSE,) 2 MG/3ML SOPN Inject 0.5 mg into the skin once a week. 3 mL 2   SYRINGE-NEEDLE, DISP, 3 ML 21G X 1-1/2" 3 ML MISC Use to inject testosterone every week 100 each 2   Testosterone 20.25 MG/ACT (1.62%) GEL Apply 1 pump on each shoulder every morning (Patient not taking: Reported on 02/26/2022) 75 g 0   venlafaxine XR (EFFEXOR-XR) 75 MG 24 hr capsule Take 1 capsule by mouth daily with breakfast. 90 capsule 1   No current facility-administered medications for this visit.    SURGICAL HISTORY:  Past Surgical History:  Procedure Laterality Date   Bilateral inguinal hernia repair     BRONCHIAL NEEDLE ASPIRATION BIOPSY  04/05/2021   Procedure: BRONCHIAL NEEDLE ASPIRATION BIOPSIES;  Surgeon: Garner Nash, DO;  Location: [facility];  Service: Pulmonary;;   COLONOSCOPY  01/23/2012   Procedure: COLONOSCOPY;  Surgeon: Daneil Dolin, MD;  [facility];  Service: Endoscopy;  Laterality: N/A;  9:30 AM   COLONOSCOPY N/A 10/11/2015   Procedure: COLONOSCOPY;  Surgeon: Daneil Dolin, MD;  [facility];  Service: Endoscopy;  Laterality: N/A;  830   COLONOSCOPY N/A 10/14/2019   Procedure: COLONOSCOPY;  Surgeon: Daneil Dolin, MD;  [facility];  Service: Endoscopy;  Laterality: N/A;  1:45   POLYPECTOMY  10/14/2019   Procedure: POLYPECTOMY;  Surgeon: Daneil Dolin, MD;  [facility];  Service: Endoscopy;;  ascending colon, descending colon   VIDEO BRONCHOSCOPY WITH ENDOBRONCHIAL ULTRASOUND N/A 04/05/2021   Procedure: VIDEO BRONCHOSCOPY WITH ENDOBRONCHIAL ULTRASOUND;  Surgeon:  Garner Nash, DO;  Location: [facility];  Service: Pulmonary;  Laterality: N/A;    REVIEW OF SYSTEMS:  A comprehensive review of systems was negative.   PHYSICAL EXAMINATION: General appearance: alert, cooperative, appears stated age, and no distress Head: Normocephalic, without obvious abnormality, atraumatic Neck: no adenopathy, no JVD, supple, symmetrical, trachea midline, and thyroid not enlarged, symmetric, no tenderness/mass/nodules Lymph nodes: Cervical, supraclavicular, and axillary nodes normal. Resp: clear to auscultation bilaterally Back: symmetric, no curvature. ROM normal. No CVA tenderness. Cardio: regular rate and rhythm, S1, S2 normal, no murmur, click, rub or gallop GI: soft, non-tender; bowel sounds normal; no masses,  no organomegaly Extremities: extremities normal, atraumatic, no cyanosis or edema  ECOG PERFORMANCE STATUS: 1 - Symptomatic but completely ambulatory  Blood pressure (!) 152/94, pulse (!) 108, temperature 97.9 F (36.6 C), temperature source Tympanic, resp. rate 18, weight 189 lb 1 oz (85.8 kg), SpO2 97 %.  LABORATORY DATA: Lab Results  Component Value Date   WBC 11.5 (H) 03/19/2022  HGB 13.4 03/19/2022   HCT 42.8 03/19/2022   MCV 93.9 03/19/2022   PLT 247 03/19/2022      Chemistry      Component Value Date/Time   NA 141 03/19/2022 1004   NA 144 01/10/2021 0849   K 3.6 03/19/2022 1004   CL 106 03/19/2022 1004   CO2 28 03/19/2022 1004   BUN 17 03/19/2022 1004   BUN 15 01/10/2021 0849   CREATININE 1.12 03/19/2022 1004      Component Value Date/Time   CALCIUM 9.4 03/19/2022 1004   ALKPHOS 67 03/19/2022 1004   AST 20 03/19/2022 1004   ALT 31 03/19/2022 1004   BILITOT 0.3 03/19/2022 1004       RADIOGRAPHIC STUDIES: No results found.  ASSESSMENT AND PLAN: This is a very pleasant 66 years old white male recently diagnosed with stage IV (T1b, N3, M1 C) non-small cell lung cancer favoring adenocarcinoma presented with right upper  lobe lung nodule in addition to right hilar, subcarinal and bilateral mediastinal as well as supraclavicular lymphadenopathy.  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  His PD-L1 expression is 80% and his molecular studies showed KRAS G12C mutation. The patient underwent SRS to metastatic brain lesion under the care of Dr. Tammi Klippel and he is currently undergoing systemic chemotherapy with carboplatin for AUC of 5, Alimta 500 Mg/M2 and Keytruda 200 Mg IV every 3 weeks status post 15 cycles.  Starting from cycle #5 the patient will be treated with maintenance treatment with Alimta and Keytruda every 3 weeks. The patient has been tolerating this treatment well with no concerning adverse effects. I recommended for him to proceed with cycle #16 today as planned. I will see him back for follow-up visit in 3 weeks for evaluation before starting cycle #17.  We will order repeat imaging studies after the next cycle of his treatment. The patient was advised to call immediately if he has any other concerning symptoms in the interval. The patient voices understanding of current disease status and treatment options and is in agreement with the current care plan.  All questions were answered. The patient knows to call the clinic with any problems, questions or concerns. We can certainly see the patient much sooner if necessary.  Disclaimer: This note was dictated with voice recognition software. Similar sounding words can inadvertently be transcribed and may not be corrected upon review.

## 2022-03-19 NOTE — Patient Instructions (Signed)
Steps to Quit Smoking Smoking tobacco is the leading cause of preventable death. It can affect almost every organ in the body. Smoking puts you and people around you at risk for many serious, long-lasting (chronic) diseases. Quitting smoking can be hard, but it is one of the best things that you can do for your health. It is never too late to quit. Do not give up if you cannot quit the first time. Some people need to try many times to quit. Do your best to stick to your quit plan, and talk with your doctor if you have any questions or concerns. How do I get ready to quit? Pick a date to quit. Set a date within the next 2 weeks to give you time to prepare. Write down the reasons why you are quitting. Keep this list in places where you will see it often. Tell your family, friends, and co-workers that you are quitting. Their support is important. Talk with your doctor about the choices that may help you quit. Find out if your health insurance will pay for these treatments. Know the people, places, things, and activities that make you want to smoke (triggers). Avoid them. What first steps can I take to quit smoking? Throw away all cigarettes at home, at work, and in your car. Throw away the things that you use when you smoke, such as ashtrays and lighters. Clean your car. Empty the ashtray. Clean your home, including curtains and carpets. What can I do to help me quit smoking? Talk with your doctor about taking medicines and seeing a counselor. You are more likely to succeed when you do both. If you are pregnant or breastfeeding: Talk with your doctor about counseling or other ways to quit smoking. Do not take medicine to help you quit smoking unless your doctor tells you to. Quit right away Quit smoking completely, instead of slowly cutting back on how much you smoke over a period of time. Stopping smoking right away may be more successful than slowly quitting. Go to counseling. In-person is best  if this is an option. You are more likely to quit if you go to counseling sessions regularly. Take medicine You may take medicines to help you quit. Some medicines need a prescription, and some you can buy over-the-counter. Some medicines may contain a drug called nicotine to replace the nicotine in cigarettes. Medicines may: Help you stop having the desire to smoke (cravings). Help to stop the problems that come when you stop smoking (withdrawal symptoms). Your doctor may ask you to use: Nicotine patches, gum, or lozenges. Nicotine inhalers or sprays. Non-nicotine medicine that you take by mouth. Find resources Find resources and other ways to help you quit smoking and remain smoke-free after you quit. They include: Online chats with a counselor. Phone quitlines. Printed self-help materials. Support groups or group counseling. Text messaging programs. Mobile phone apps. Use apps on your mobile phone or tablet that can help you stick to your quit plan. Examples of free services include Quit Guide from the CDC and smokefree.gov  What can I do to make it easier to quit?  Talk to your family and friends. Ask them to support and encourage you. Call a phone quitline, such as 1-800-QUIT-NOW, reach out to support groups, or work with a counselor. Ask people who smoke to not smoke around you. Avoid places that make you want to smoke, such as: Bars. Parties. Smoke-break areas at work. Spend time with people who do not smoke. Lower   the stress in your life. Stress can make you want to smoke. Try these things to lower stress: Getting regular exercise. Doing deep-breathing exercises. Doing yoga. Meditating. What benefits will I see if I quit smoking? Over time, you may have: A better sense of smell and taste. Less coughing and sore throat. A slower heart rate. Lower blood pressure. Clearer skin. Better breathing. Fewer sick days. Summary Quitting smoking can be hard, but it is one of  the best things that you can do for your health. Do not give up if you cannot quit the first time. Some people need to try many times to quit. When you decide to quit smoking, make a plan to help you succeed. Quit smoking right away, not slowly over a period of time. When you start quitting, get help and support to keep you smoke-free. This information is not intended to replace advice given to you by your health care provider. Make sure you discuss any questions you have with your health care provider. Document Revised: 09/15/2021 Document Reviewed: 09/15/2021 Elsevier Patient Education  2023 [employer].  

## 2022-03-19 NOTE — Addendum Note (Signed)
Addended by: Ardeen Garland on: 03/19/2022 12:37 PM   Modules accepted: Orders

## 2022-03-29 ENCOUNTER — Other Ambulatory Visit (HOSPITAL_COMMUNITY): Payer: Self-pay

## 2022-04-02 ENCOUNTER — Encounter: Payer: Self-pay | Admitting: *Deleted

## 2022-04-04 ENCOUNTER — Other Ambulatory Visit (HOSPITAL_COMMUNITY): Payer: Self-pay

## 2022-04-05 NOTE — Progress Notes (Signed)
[facility] OFFICE PROGRESS NOTE  [name], [place] [address]  DIAGNOSIS:  Stage IV (T1b, N3, M1C) non-small cell lung cancer, favoring adenocarcinoma presented with right upper lobe lung nodule in addition to right hilar, subcarinal and bilateral mediastinal as well as supraclavicular lymphadenopathy in addition to bone and brain metastasis diagnosed in June 2022.     PD-L1 expression 80%.     Molecular Studies:  Biomarker Findings Microsatellite status - MS-Stable Tumor Mutational Burden - 6 Muts/Mb Genomic Findings For a complete list of the genes assayed, please refer to the Appendix. KRAS G12C, amplification ATM S470* CCND1 amplification - equivocal? HGF amplification - equivocal? MYC amplification - equivocal? FGF19 amplification - equivocal? FGF3 amplification - equivocal? FGF4 amplification - equivocal? NFKBIA amplification NKX2-1 amplification RAD21 amplification - equivocal? [id number]*26 TERT promoter -124C>T TP53 rearrangement exon 9 7 Disease relevant genes with no reportable alterations: ALK, BRAF, EGFR, ERBB2, MET, RET, ROS1  PRIOR THERAPY: SRS to the metastatic brain lesions under the care of Dr. MTammi Klippel  Last treatment on 05/04/2021.  CURRENT THERAPY: : Palliative systemic chemotherapy with carboplatin for an AUC 5, Alimta 500 mg/m2 and, Keytruda 200 mg IV every 3 weeks.  First dose expected on 05/08/2021.  Status post 16 cycles.  Starting from cycle #5 he is on maintenance treatment with Alimta and Keytruda every 3 weeks.  INTERVAL HISTORY: [facility] 66y.o. male returns to the clinic today for a follow-up visit accompanied by his daughter  The patient is feeling fairly well today without any new concerning complaints.  He is currently undergoing treatment with steroids (decadron 2 mg) under the care of Dr. NDorris Fetch He noticed some associated weight gain and puffiness around his shoulders. He denies any  fever, chills, night sweats, or unexplained weight loss.  He denies any significant dyspnea exertion out of the ordinary but he reports his breathing is not what it used to be. His breathing may be labored if carrying heavy objects.  Denies any cough, chest pain, or hemoptysis.  Denies any nausea, vomiting, or constipation.  He may sometimes have intermittent diarrhea which he attributes to his metformin and Ozempic.  Denies any unusual headache or visual changes. Denies rashes or skin changes. He has some easy bruising. He is here today for evaluation and repeat blood work before undergoing cycle #17.   MEDICAL HISTORY: Past Medical History:  Diagnosis Date   Cervical spondylolysis    Essential hypertension    GERD (gastroesophageal reflux disease)    History of kidney stones    History of migraine    Hyperlipidemia    Hypertension    PONV (postoperative nausea and vomiting)    Type 2 diabetes mellitus (HCC)     ALLERGIES:  has No Known Allergies.  MEDICATIONS:  Current Outpatient Medications  Medication Sig Dispense Refill   cholecalciferol (VITAMIN D) 1000 UNITS tablet Take 1,000 Units by mouth every evening.     Continuous Blood Gluc Sensor (FREESTYLE LIBRE 3 SENSOR) MISC Place 1 sensor on the skin every 14 days. Use to check glucose continuously 2 each 2   dexamethasone (DECADRON) 1 MG tablet Take 2 tablets by mouth daily. 180 tablet 1   docusate sodium (COLACE) 100 MG capsule Take 100 mg by mouth daily.     folic acid (FOLVITE) 1 MG tablet Take 1 tablet by mouth daily. 30 tablet 4   Krill Oil 300 MG CAPS Take 300 mg by mouth every evening.  levothyroxine (SYNTHROID) 50 MCG tablet Take 1 tablet (50 mcg total) by mouth daily before breakfast. 90 tablet 1   loratadine (CLARITIN) 10 MG tablet Take 10 mg by mouth every evening.      losartan (COZAAR) 50 MG tablet Take 1 and 1/2 tablets (75 mg total) by mouth daily. 135 tablet 0   metFORMIN (GLUCOPHAGE) 500 MG tablet Take 500 mg by  mouth daily with breakfast. Take 529m once a day with breakfast     mirtazapine (REMERON) 15 MG tablet Take 1 tablet by mouth at bedtime. 90 tablet 1   Multiple Vitamins-Minerals (MULTIVITAMIN GUMMIES ADULT PO) Take 2 each by mouth daily.     pantoprazole (PROTONIX) 40 MG tablet Take 1 tablet by mouth every evening. 90 tablet 1   PRESCRIPTION MEDICATION Testosterone injection     prochlorperazine (COMPAZINE) 10 MG tablet Take 1 tablet (10 mg total) by mouth every 6 (six) hours as needed for nausea or vomiting. 30 tablet 0   rosuvastatin (CRESTOR) 20 MG tablet Take 1 tablet by mouth daily. 90 tablet 1   Semaglutide,0.25 or 0.5MG/DOS, (OZEMPIC, 0.25 OR 0.5 MG/DOSE,) 2 MG/3ML SOPN Inject 0.5 mg into the skin once a week. 3 mL 2   SYRINGE-NEEDLE, DISP, 3 ML 21G X 1-1/2" 3 ML MISC Use to inject testosterone every week 100 each 2   venlafaxine XR (EFFEXOR-XR) 75 MG 24 hr capsule Take 1 capsule by mouth daily with breakfast. 90 capsule 1   Testosterone 20.25 MG/ACT (1.62%) GEL Apply 1 pump on each shoulder every morning (Patient not taking: Reported on 02/26/2022) 75 g 0   No current facility-administered medications for this visit.    SURGICAL HISTORY:  Past Surgical History:  Procedure Laterality Date   Bilateral inguinal hernia repair     BRONCHIAL NEEDLE ASPIRATION BIOPSY  04/05/2021   Procedure: BRONCHIAL NEEDLE ASPIRATION BIOPSIES;  Surgeon: IGarner Nash DO;  Location: [facility]  Service: Pulmonary;;   COLONOSCOPY  01/23/2012   Procedure: COLONOSCOPY;  Surgeon: RDaneil Dolin MD;  [facility];  Service: Endoscopy;  Laterality: N/A;  9:30 AM   COLONOSCOPY N/A 10/11/2015   Procedure: COLONOSCOPY;  Surgeon: RDaneil Dolin MD;  [facility];  Service: Endoscopy;  Laterality: N/A;  830   COLONOSCOPY N/A 10/14/2019   Procedure: COLONOSCOPY;  Surgeon: RDaneil Dolin MD;  [facility];  Service: Endoscopy;  Laterality: N/A;  1:45   POLYPECTOMY  10/14/2019    Procedure: POLYPECTOMY;  Surgeon: RDaneil Dolin MD;  [facility];  Service: Endoscopy;;  ascending colon, descending colon   VIDEO BRONCHOSCOPY WITH ENDOBRONCHIAL ULTRASOUND N/A 04/05/2021   Procedure: VIDEO BRONCHOSCOPY WITH ENDOBRONCHIAL ULTRASOUND;  Surgeon: IGarner Nash DO;  Location: [facility]  Service: Pulmonary;  Laterality: N/A;    REVIEW OF SYSTEMS:   Constitutional: Positive for baseline fatigue. Negative for appetite change, chills, fever and unexpected weight change.  HENT: Negative for mouth sores, nosebleeds, sore throat and trouble swallowing.   Eyes: Negative for eye problems and icterus.  Respiratory: Positive for occasional baseline shortness of breath. Negative for cough, hemoptysis, and wheezing.   Cardiovascular: Negative for chest pain and leg swelling.  Gastrointestinal: Positive for occasional diarrhea. Negative for abdominal pain, constipation, nausea and vomiting.  Genitourinary: Negative for bladder incontinence, difficulty urinating, dysuria, frequency and hematuria.   Musculoskeletal: Negative for back pain, gait problem, neck pain and neck stiffness.  Skin: Negative for itching and rash.  Neurological: Negative for  dizziness, extremity weakness, gait problem, headaches, light-headedness and seizures.  Hematological: Negative for adenopathy. Does not bruise/bleed easily.  Psychiatric/Behavioral: Negative for confusion, depression and sleep disturbance. The patient is not nervous/anxious.       PHYSICAL EXAMINATION:  Blood pressure (!) 149/100, pulse (!) 104, temperature 98.2 F (36.8 C), temperature source Oral, resp. rate 16, height '6\' 1"'  (1.854 m), weight 190 lb 9.6 oz (86.5 kg), SpO2 97 %.  ECOG PERFORMANCE STATUS: 1  Physical Exam  Constitutional: Oriented to person, place, and time and well-developed, well-nourished, and in no distress.  HENT:  Head: Normocephalic and atraumatic.  Mouth/Throat: Oropharynx is clear and moist. No  oropharyngeal exudate.  Eyes: Conjunctivae are normal. Right eye exhibits no discharge. Left eye exhibits no discharge. No scleral icterus.  Neck: Normal range of motion. Neck supple.  Cardiovascular: Normal rate, regular rhythm, normal heart sounds and intact distal pulses.   Pulmonary/Chest: Effort normal and breath sounds normal. No respiratory distress. No wheezes. No rales.  Abdominal: Soft. Bowel sounds are normal. Exhibits no distension and no mass. There is no tenderness.  Musculoskeletal: Normal range of motion. Exhibits no edema.  Lymphadenopathy:    No cervical adenopathy. Positive for some puffiness of the supraclavicular area which is likely secondary to his steroids.  Neurological: Alert and oriented to person, place, and time. Exhibits normal muscle tone. Gait normal. Coordination normal.  Skin: Skin is warm and dry. No rash noted. Not diaphoretic. No erythema. No pallor. Has some bruising on his upper extremities bilaterally.  Psychiatric: Mood, memory and judgment normal.  Vitals reviewed.  LABORATORY DATA: Lab Results  Component Value Date   WBC 11.8 (H) 04/09/2022   HGB 12.5 (L) 04/09/2022   HCT 39.9 04/09/2022   MCV 91.9 04/09/2022   PLT 256 04/09/2022      Chemistry      Component Value Date/Time   NA 142 04/09/2022 1042   NA 144 01/10/2021 0849   K 3.8 04/09/2022 1042   CL 107 04/09/2022 1042   CO2 28 04/09/2022 1042   BUN 11 04/09/2022 1042   BUN 15 01/10/2021 0849   CREATININE 1.06 04/09/2022 1042      Component Value Date/Time   CALCIUM 9.1 04/09/2022 1042   ALKPHOS 65 04/09/2022 1042   AST 19 04/09/2022 1042   ALT 24 04/09/2022 1042   BILITOT 0.3 04/09/2022 1042       RADIOGRAPHIC STUDIES:  No results found.   ASSESSMENT/PLAN:  This is a very pleasant 66 year old Caucasian male diagnosed with stage IV (T1b, N3, M1 C) non-small cell lung cancer, favoring adenocarcinoma.  The patient presented with a right upper lobe lung nodule in addition  to right hilar, subcarinal, and bilateral mediastinal lymphadenopathy as well as supraclavicular lymphadenopathy.  The patient also has metastatic disease to the brain.  He was diagnosed in June 2022.  His PD-L1 expression is 80%.  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   The patient completed SRS to the brain lesions under the care of Dr. Tammi Klippel.  His last treatment was on 05/04/2021. 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    The patient is currently undergoing systemic chemotherapy with carboplatin for an AUC of 5, Alimta 500 mg per metered squared, Keytruda 200 mg IV every 3 weeks.  Status post 16 cycles. Starting from cycle #5, the patient started maintenance Alimta  and Keytruda IV every 3 weeks.     Labs were reviewed. His TSH is pending. Recommend that he proceed with cycle #17 today scheduled.   I will arrange for restaging CT scan of the chest, abdomen, pelvis prior to starting his next cycle of treatment. The patients most recent repeat scan at his last appointment which showed slightly enlarging pulmonary nodules which we will continue to monitor closely on subsequent imaging. The patient also reported some puffiness in the lower neck/supraclavicular area, while this is likely due to his steroids, we will assess this area on upcoming imaging.   We will see him back for follow-up visit in 3 weeks for evaluation and to review his scan before starting cycle #18.  The patient was advised to call immediately if he has any concerning symptoms in the interval. The patient voices understanding of current disease status and treatment options and is in agreement with the current care plan. All questions were answered. The patient knows to call the clinic with any problems, questions or concerns. We can certainly see the patient much sooner if  necessary        Orders Placed This Encounter  Procedures   CT Chest W Contrast    Standing Status:   Future    Standing Expiration Date:   04/09/2023    Order Specific Question:   If indicated for the ordered procedure, I authorize the administration of contrast media per Radiology protocol    Answer:   Yes    Order Specific Question:   Preferred imaging location?    Answer:   [facility]   CT Abdomen Pelvis W Contrast    Standing Status:   Future    Standing Expiration Date:   04/09/2023    Order Specific Question:   If indicated for the ordered procedure, I authorize the administration of contrast media per Radiology protocol    Answer:   Yes    Order Specific Question:   Preferred imaging location?    Answer:   [facility]    Order Specific Question:   Is Oral Contrast requested for this exam?    Answer:   Yes, Per Radiology protocol      The total time spent in the appointment was 20-29 minutes.   Raynah Gomes L Aidee Latimore, PA-C 04/09/22

## 2022-04-07 ENCOUNTER — Other Ambulatory Visit: Payer: Self-pay | Admitting: Family Medicine

## 2022-04-07 DIAGNOSIS — E1165 Type 2 diabetes mellitus with hyperglycemia: Secondary | ICD-10-CM

## 2022-04-09 ENCOUNTER — Inpatient Hospital Stay (HOSPITAL_BASED_OUTPATIENT_CLINIC_OR_DEPARTMENT_OTHER): Payer: 59 | Admitting: Physician Assistant

## 2022-04-09 ENCOUNTER — Other Ambulatory Visit (HOSPITAL_COMMUNITY): Payer: Self-pay

## 2022-04-09 ENCOUNTER — Inpatient Hospital Stay: Payer: 59 | Attending: Physician Assistant

## 2022-04-09 ENCOUNTER — Other Ambulatory Visit: Payer: Self-pay

## 2022-04-09 ENCOUNTER — Inpatient Hospital Stay: Payer: 59

## 2022-04-09 VITALS — HR 98

## 2022-04-09 VITALS — BP 149/100 | HR 104 | Temp 98.2°F | Resp 16 | Ht 73.0 in | Wt 190.6 lb

## 2022-04-09 DIAGNOSIS — Z5111 Encounter for antineoplastic chemotherapy: Secondary | ICD-10-CM | POA: Insufficient documentation

## 2022-04-09 DIAGNOSIS — Z7984 Long term (current) use of oral hypoglycemic drugs: Secondary | ICD-10-CM | POA: Insufficient documentation

## 2022-04-09 DIAGNOSIS — C7931 Secondary malignant neoplasm of brain: Secondary | ICD-10-CM | POA: Insufficient documentation

## 2022-04-09 DIAGNOSIS — Z923 Personal history of irradiation: Secondary | ICD-10-CM | POA: Diagnosis not present

## 2022-04-09 DIAGNOSIS — Z5112 Encounter for antineoplastic immunotherapy: Secondary | ICD-10-CM | POA: Diagnosis not present

## 2022-04-09 DIAGNOSIS — C7951 Secondary malignant neoplasm of bone: Secondary | ICD-10-CM | POA: Insufficient documentation

## 2022-04-09 DIAGNOSIS — E538 Deficiency of other specified B group vitamins: Secondary | ICD-10-CM | POA: Insufficient documentation

## 2022-04-09 DIAGNOSIS — F17210 Nicotine dependence, cigarettes, uncomplicated: Secondary | ICD-10-CM | POA: Insufficient documentation

## 2022-04-09 DIAGNOSIS — C3491 Malignant neoplasm of unspecified part of right bronchus or lung: Secondary | ICD-10-CM | POA: Diagnosis not present

## 2022-04-09 DIAGNOSIS — C3411 Malignant neoplasm of upper lobe, right bronchus or lung: Secondary | ICD-10-CM | POA: Diagnosis not present

## 2022-04-09 DIAGNOSIS — Z79899 Other long term (current) drug therapy: Secondary | ICD-10-CM | POA: Diagnosis not present

## 2022-04-09 LAB — CBC WITH DIFFERENTIAL (CANCER CENTER ONLY)
Abs Immature Granulocytes: 0.11 10*3/uL — ABNORMAL HIGH (ref 0.00–0.07)
Basophils Absolute: 0.1 10*3/uL (ref 0.0–0.1)
Basophils Relative: 1 %
Eosinophils Absolute: 0.1 10*3/uL (ref 0.0–0.5)
Eosinophils Relative: 1 %
HCT: 39.9 % (ref 39.0–52.0)
Hemoglobin: 12.5 g/dL — ABNORMAL LOW (ref 13.0–17.0)
Immature Granulocytes: 1 %
Lymphocytes Relative: 11 %
Lymphs Abs: 1.3 10*3/uL (ref 0.7–4.0)
MCH: 28.8 pg (ref 26.0–34.0)
MCHC: 31.3 g/dL (ref 30.0–36.0)
MCV: 91.9 fL (ref 80.0–100.0)
Monocytes Absolute: 0.7 10*3/uL (ref 0.1–1.0)
Monocytes Relative: 6 %
Neutro Abs: 9.5 10*3/uL — ABNORMAL HIGH (ref 1.7–7.7)
Neutrophils Relative %: 80 %
Platelet Count: 256 10*3/uL (ref 150–400)
RBC: 4.34 MIL/uL (ref 4.22–5.81)
RDW: 16.9 % — ABNORMAL HIGH (ref 11.5–15.5)
WBC Count: 11.8 10*3/uL — ABNORMAL HIGH (ref 4.0–10.5)
nRBC: 0 % (ref 0.0–0.2)

## 2022-04-09 LAB — CMP (CANCER CENTER ONLY)
ALT: 24 U/L (ref 0–44)
AST: 19 U/L (ref 15–41)
Albumin: 3.9 g/dL (ref 3.5–5.0)
Alkaline Phosphatase: 65 U/L (ref 38–126)
Anion gap: 7 (ref 5–15)
BUN: 11 mg/dL (ref 8–23)
CO2: 28 mmol/L (ref 22–32)
Calcium: 9.1 mg/dL (ref 8.9–10.3)
Chloride: 107 mmol/L (ref 98–111)
Creatinine: 1.06 mg/dL (ref 0.61–1.24)
GFR, Estimated: >60 mL/min (ref >60)
Glucose, Bld: 116 mg/dL — ABNORMAL HIGH (ref 70–99)
Potassium: 3.8 mmol/L (ref 3.5–5.1)
Sodium: 142 mmol/L (ref 135–145)
Total Bilirubin: 0.3 mg/dL (ref 0.3–1.2)
Total Protein: 6.7 g/dL (ref 6.5–8.1)

## 2022-04-09 LAB — TSH: TSH: 0.597 u[IU]/mL (ref 0.350–4.500)

## 2022-04-09 MED ORDER — SODIUM CHLORIDE 0.9 % IV SOLN: Freq: Once | INTRAVENOUS | Status: AC

## 2022-04-09 MED ORDER — PROCHLORPERAZINE MALEATE 10 MG PO TABS
10.0000 mg | ORAL_TABLET | Freq: Once | ORAL | Status: AC
  Administered 2022-04-09: 10 mg via ORAL
  Filled 2022-04-09: qty 1

## 2022-04-09 MED ORDER — SODIUM CHLORIDE 0.9 % IV SOLN
500.0000 mg/m2 | Freq: Once | INTRAVENOUS | Status: AC
  Administered 2022-04-09: 1000 mg via INTRAVENOUS
  Filled 2022-04-09: qty 40

## 2022-04-09 MED ORDER — SODIUM CHLORIDE 0.9 % IV SOLN
200.0000 mg | Freq: Once | INTRAVENOUS | Status: AC
  Administered 2022-04-09: 200 mg via INTRAVENOUS
  Filled 2022-04-09: qty 200

## 2022-04-09 MED ORDER — FREESTYLE LIBRE 3 SENSOR MISC
1.0000 | 2 refills | Status: DC
  Filled 2022-04-09: qty 2, 28d supply, fill #0
  Filled 2022-05-08: qty 2, 28d supply, fill #1
  Filled 2022-06-01: qty 2, 28d supply, fill #2

## 2022-04-09 NOTE — Patient Instructions (Signed)
[facility]   Discharge Instructions: Thank you for choosing [facility] to provide your oncology and hematology care.   If you have a lab appointment with the [facility], please go directly to the [facility] and check in at the registration area.   Wear comfortable clothing and clothing appropriate for easy access to any Portacath or PICC line.   We strive to give you quality time with your provider. You may need to reschedule your appointment if you arrive late (15 or more minutes).  Arriving late affects you and other patients whose appointments are after yours.  Also, if you miss three or more appointments without notifying the office, you may be dismissed from the clinic at the provider's discretion.      For prescription refill requests, have your pharmacy contact our office and allow 72 hours for refills to be completed.    Today you received the following chemotherapy and/or immunotherapy agents: pembrolizumab and pemetrexed      To help prevent nausea and vomiting after your treatment, we encourage you to take your nausea medication as directed.  BELOW ARE SYMPTOMS THAT SHOULD BE REPORTED IMMEDIATELY: *FEVER GREATER THAN 100.4 F (38 C) OR HIGHER *CHILLS OR SWEATING *NAUSEA AND VOMITING THAT IS NOT CONTROLLED WITH YOUR NAUSEA MEDICATION *UNUSUAL SHORTNESS OF BREATH *UNUSUAL BRUISING OR BLEEDING *URINARY PROBLEMS (pain or burning when urinating, or frequent urination) *BOWEL PROBLEMS (unusual diarrhea, constipation, pain near the anus) TENDERNESS IN MOUTH AND THROAT WITH OR WITHOUT PRESENCE OF ULCERS (sore throat, sores in mouth, or a toothache) UNUSUAL RASH, SWELLING OR PAIN  UNUSUAL VAGINAL DISCHARGE OR ITCHING   Items with * indicate a potential emergency and should be followed up as soon as possible or go to the Emergency Department if any problems should occur.  Please show the CHEMOTHERAPY ALERT CARD or IMMUNOTHERAPY ALERT  CARD at check-in to the Emergency Department and triage nurse.  Should you have questions after your visit or need to cancel or reschedule your appointment, please contact [facility]  Dept: [phone]  and follow the prompts.  Office hours are 8:00 a.m. to 4:30 p.m. Monday - Friday. Please note that voicemails left after 4:00 p.m. may not be returned until the following business day.  We are closed weekends and major holidays. You have access to a nurse at all times for urgent questions. Please call the main number to the clinic Dept: [phone] and follow the prompts.   For any non-urgent questions, you may also contact your provider using MyChart. We now offer e-Visits for anyone 9 and older to request care online for non-urgent symptoms. For details visit [email or web address].   Also download the MyChart app! Go to the app store, search "MyChart", open the app, select [facility], and log in with your MyChart username and password.  Masks are optional in the cancer centers. If you would like for your care team to wear a mask while they are taking care of you, please let them know. For doctor visits, patients may have with them one support person who is at least 65 years old. At this time, visitors are not allowed in the infusion area.

## 2022-04-14 ENCOUNTER — Other Ambulatory Visit (HOSPITAL_COMMUNITY): Payer: Self-pay

## 2022-04-16 ENCOUNTER — Other Ambulatory Visit (HOSPITAL_COMMUNITY): Payer: Self-pay

## 2022-04-17 ENCOUNTER — Ambulatory Visit: Payer: 59 | Admitting: Family Medicine

## 2022-04-17 ENCOUNTER — Encounter: Payer: Self-pay | Admitting: Family Medicine

## 2022-04-17 ENCOUNTER — Other Ambulatory Visit (HOSPITAL_COMMUNITY): Payer: Self-pay

## 2022-04-17 VITALS — BP 139/95 | HR 100 | Temp 97.5°F | Ht 73.0 in | Wt 190.6 lb

## 2022-04-17 DIAGNOSIS — E782 Mixed hyperlipidemia: Secondary | ICD-10-CM | POA: Diagnosis not present

## 2022-04-17 DIAGNOSIS — K219 Gastro-esophageal reflux disease without esophagitis: Secondary | ICD-10-CM | POA: Diagnosis not present

## 2022-04-17 DIAGNOSIS — E1165 Type 2 diabetes mellitus with hyperglycemia: Secondary | ICD-10-CM

## 2022-04-17 DIAGNOSIS — I10 Essential (primary) hypertension: Secondary | ICD-10-CM | POA: Diagnosis not present

## 2022-04-17 DIAGNOSIS — F3340 Major depressive disorder, recurrent, in remission, unspecified: Secondary | ICD-10-CM

## 2022-04-17 LAB — BAYER DCA HB A1C WAIVED: HB A1C (BAYER DCA - WAIVED): 6.6 % — ABNORMAL HIGH (ref 4.8–5.6)

## 2022-04-17 MED ORDER — ROSUVASTATIN CALCIUM 20 MG PO TABS
20.0000 mg | ORAL_TABLET | Freq: Every day | ORAL | 1 refills | Status: DC
  Filled 2022-04-17 – 2022-07-10 (×2): qty 90, 90d supply, fill #0

## 2022-04-17 MED ORDER — VENLAFAXINE HCL ER 75 MG PO CP24
75.0000 mg | ORAL_CAPSULE | Freq: Every day | ORAL | 1 refills | Status: DC
  Filled 2022-04-17 – 2022-07-10 (×2): qty 90, 90d supply, fill #0

## 2022-04-17 NOTE — Patient Instructions (Signed)
Stop Metformin

## 2022-04-17 NOTE — Progress Notes (Signed)
Assessment & Plan:  1. Type 2 diabetes mellitus with hyperglycemia, without long-term current use of insulin (HCC) Lab Results  Component Value Date   HGBA1C 6.6 (H) 04/17/2022   HGBA1C 7.5 (H) 01/10/2022   HGBA1C 9.7 (H) 10/19/2021    - Diabetes is at goal of A1c < 7. - Medications: continue current medications, D/C metformin due to increasing diarrhea. - Home glucose monitoring: continue using freestyle libre. - Patient is currently taking a statin. Patient is taking an ACE-inhibitor/ARB.   Diabetes Health Maintenance Due  Topic Date Due   HEMOGLOBIN A1C  10/18/2022   OPHTHALMOLOGY EXAM  11/15/2022   FOOT EXAM  01/11/2023    Lab Results  Component Value Date   LABMICR 7.3 10/19/2021   LABMICR 4.8 03/09/2020   MICROALBUR neg 07/13/2014   - Bayer DCA Hb A1c Waived  2. Essential hypertension Well controlled on current regimen normally. Encouraged to monitor BP and let me know if consistently >130/90.  3. Mixed hyperlipidemia Well controlled on current regimen.  - rosuvastatin (CRESTOR) 20 MG tablet; Take 1 tablet by mouth daily.  Dispense: 90 tablet; Refill: 1  4. Recurrent major depression in remission ([place]) Well controlled on current regimen.  - venlafaxine XR (EFFEXOR-XR) 75 MG 24 hr capsule; Take 1 capsule by mouth daily with breakfast.  Dispense: 90 capsule; Refill: 1  5. Gastroesophageal reflux disease without esophagitis Well controlled on current regimen.    Return in about 3 months (around 07/18/2022) for follow-up of chronic medication conditions with M. [name].  Hendricks Limes, MSN, APRN, FNP-C [facility]  Subjective:    Patient ID: Bradley Goodman, male    DOB: 1956-03-05, 66 y.o.   MRN: [id number]  Patient Care Team: [name], FNP as PCP - General (Family Medicine) Gala Romney Cristopher Estimable, MD as Consulting Physician (Gastroenterology) Celestia Khat, [place] (Optometry)   Chief Complaint:  Chief Complaint  Patient presents with    Medical Management of Chronic Issues    HPI: GEORGIO Goodman is a 66 y.o. male presenting on 04/17/2022 for Medical Management of Chronic Issues  Diabetes: Patient presents for follow up of diabetes. Current symptoms include: hyperglycemia. Known diabetic complications: none. Medication compliance: yes. He has had more burping and diarrhea since increasing his dose of Ozempic. Current diet: in general, a "healthy" diet  . States he is trying to learn what foods increase his glucose and the freestyle Elenor Legato is helping a lot with this. Current exercise: weightlifting. Home blood sugar records:  using the freestyle libre 3 . Is he  on ACE inhibitor or angiotensin II receptor blocker? Yes (Losartan). Is he on a statin? Yes (Rosuvastatin).   Depression: doing well with Effexor and Remeron. He has seen a great improvement in difficulty sleeping and appetite.      04/17/2022    9:41 AM 01/10/2022    8:11 AM 11/16/2021    8:23 AM  Depression screen PHQ 2/9  Decreased Interest 0 0 0  Down, Depressed, Hopeless 0 1 0  PHQ - 2 Score 0 1 0  Altered sleeping 2 0 1  Tired, decreased energy 0 1 0  Change in appetite 0 0 1  Feeling bad or failure about yourself  0 0 0  Trouble concentrating 0 0 0  Moving slowly or fidgety/restless 2 0 0  Suicidal thoughts 0 0 0  PHQ-9 Score _0 Difficult doing work/chores Not difficult at all Not difficult at all Not difficult at all  Hypertension: patient does not monitor his blood pressure at home. Blood pressure is elevated today, but this is not typical.  Hyperlipidemia: taking rosuvastatin.  Vitamin D Deficiency: taking OTC vitamin D supplement.  Lung cancer with metastasis to the bone and brain. Diagnosed in June 2022. Managed by oncologist, Dr. Julien Nordmann.   New complaints: None   Social history:  Relevant past medical, surgical, family and social history reviewed and updated as indicated. Interim medical history since our last visit  reviewed.  Allergies and medications reviewed and updated.  DATA REVIEWED: CHART IN EPIC  ROS: Negative unless specifically indicated above in HPI.    Current Outpatient Medications:    cholecalciferol (VITAMIN D) 1000 UNITS tablet, Take 1,000 Units by mouth every evening., Disp: , Rfl:    Continuous Blood Gluc Sensor (FREESTYLE LIBRE 3 SENSOR) MISC, Place 1 sensor on the skin every 14 days. Use to check glucose continuously, Disp: 2 each, Rfl: 2   dexamethasone (DECADRON) 1 MG tablet, Take 2 tablets by mouth daily., Disp: 180 tablet, Rfl: 1   docusate sodium (COLACE) 100 MG capsule, Take 100 mg by mouth daily., Disp: , Rfl:    folic acid (FOLVITE) 1 MG tablet, Take 1 tablet by mouth daily., Disp: 30 tablet, Rfl: 4   Krill Oil 300 MG CAPS, Take 300 mg by mouth every evening., Disp: , Rfl:    levothyroxine (SYNTHROID) 50 MCG tablet, Take 1 tablet (50 mcg total) by mouth daily before breakfast., Disp: 90 tablet, Rfl: 1   loratadine (CLARITIN) 10 MG tablet, Take 10 mg by mouth every evening. , Disp: , Rfl:    losartan (COZAAR) 50 MG tablet, Take 1 and 1/2 tablets (75 mg total) by mouth daily., Disp: 135 tablet, Rfl: 0   metFORMIN (GLUCOPHAGE) 500 MG tablet, Take 500 mg by mouth daily with breakfast. Take 535m once a day with breakfast, Disp: , Rfl:    mirtazapine (REMERON) 15 MG tablet, Take 1 tablet by mouth at bedtime., Disp: 90 tablet, Rfl: 1   Multiple Vitamins-Minerals (MULTIVITAMIN GUMMIES ADULT PO), Take 2 each by mouth daily., Disp: , Rfl:    pantoprazole (PROTONIX) 40 MG tablet, Take 1 tablet by mouth every evening., Disp: 90 tablet, Rfl: 1   PRESCRIPTION MEDICATION, Testosterone injection, Disp: , Rfl:    prochlorperazine (COMPAZINE) 10 MG tablet, Take 1 tablet (10 mg total) by mouth every 6 (six) hours as needed for nausea or vomiting., Disp: 30 tablet, Rfl: 0   rosuvastatin (CRESTOR) 20 MG tablet, Take 1 tablet by mouth daily., Disp: 90 tablet, Rfl: 1   Semaglutide,0.25 or  0.5MG/DOS, (OZEMPIC, 0.25 OR 0.5 MG/DOSE,) 2 MG/3ML SOPN, Inject 0.5 mg into the skin once a week., Disp: 3 mL, Rfl: 2   SYRINGE-NEEDLE, DISP, 3 ML 21G X 1-1/2" 3 ML MISC, Use to inject testosterone every week, Disp: 100 each, Rfl: 2   Testosterone 20.25 MG/ACT (1.62%) GEL, Apply 1 pump on each shoulder every morning, Disp: 75 g, Rfl: 0   venlafaxine XR (EFFEXOR-XR) 75 MG 24 hr capsule, Take 1 capsule by mouth daily with breakfast., Disp: 90 capsule, Rfl: 1   No Known Allergies Past Medical History:  Diagnosis Date   Cervical spondylolysis    Essential hypertension    GERD (gastroesophageal reflux disease)    History of kidney stones    History of migraine    Hyperlipidemia    Hypertension    PONV (postoperative nausea and vomiting)    [place] 2 [place]  Past Surgical History:  Procedure Laterality Date   Bilateral inguinal hernia repair     BRONCHIAL NEEDLE ASPIRATION BIOPSY  04/05/2021   Procedure: BRONCHIAL NEEDLE ASPIRATION BIOPSIES;  Surgeon: Icard, Bradley L, DO;  Location: [facility];  Service: Pulmonary;;   COLONOSCOPY  01/23/2012   Procedure: COLONOSCOPY;  Surgeon: Robert M Rourk, MD;  Location: [facility];  Service: Endoscopy;  Laterality: N/A;  9:30 AM   COLONOSCOPY N/A 10/11/2015   Procedure: COLONOSCOPY;  Surgeon: Robert M Rourk, MD;  Location: [facility];  Service: Endoscopy;  Laterality: N/A;  830   COLONOSCOPY N/A 10/14/2019   Procedure: COLONOSCOPY;  Surgeon: Rourk, Robert M, MD;  Location: [facility];  Service: Endoscopy;  Laterality: N/A;  1:45   POLYPECTOMY  10/14/2019   Procedure: POLYPECTOMY;  Surgeon: Rourk, Robert M, MD;  Location: [facility];  Service: Endoscopy;;  ascending colon, descending colon   VIDEO BRONCHOSCOPY WITH ENDOBRONCHIAL ULTRASOUND N/A 04/05/2021   Procedure: VIDEO BRONCHOSCOPY WITH ENDOBRONCHIAL ULTRASOUND;  Surgeon: Icard, Bradley L, DO;  Location: [facility];  Service: Pulmonary;  Laterality: N/A;    Social History    Socioeconomic History   Marital status: Married    Spouse name: Not on file   Number of children: Not on file   Years of education: Not on file   Highest education level: Not on file  Occupational History   Not on file  Tobacco Use   Smoking status: Every Day    Packs/day: 0.50    Years: 30.00    Total pack years: 15.00    Types: Cigarettes    Start date: 10/30/1973   Smokeless tobacco: Never  Vaping Use   Vaping Use: Never used  Substance and Sexual Activity   Alcohol use: Yes    Alcohol/week: 0.0 standard drinks of alcohol    Comment: One drink every 6 months.   Drug use: No   Sexual activity: Yes  Other Topics Concern   Not on file  Social History Narrative   Not on file   Social Determinants of Health   Financial Resource Strain: Not on file  Food Insecurity: Not on file  Transportation Needs: Not on file  Physical Activity: Not on file  Stress: Not on file  Social Connections: Not on file  Intimate Partner Violence: Not on file        Objective:    BP (!) 139/95   Pulse 100   Temp (!) 97.5 F (36.4 C) (Temporal)   Ht 6' 1" (1.854 m)   Wt 190 lb 9.6 oz (86.5 kg)   SpO2 94%   BMI 25.15 kg/m   Wt Readings from Last 3 Encounters:  04/17/22 190 lb 9.6 oz (86.5 kg)  04/09/22 190 lb 9.6 oz (86.5 kg)  03/19/22 189 lb 1 oz (85.8 kg)    Physical Exam Vitals reviewed.  Constitutional:      General: He is not in acute distress.    Appearance: Normal appearance. He is normal weight. He is not ill-appearing, toxic-appearing or diaphoretic.  HENT:     Head: Normocephalic and atraumatic.  Eyes:     General: No scleral icterus.       Right eye: No discharge.        Left eye: No discharge.     Conjunctiva/sclera: Conjunctivae normal.  Cardiovascular:     Rate and Rhythm: Normal rate and regular rhythm.     Heart sounds: Normal heart sounds. No murmur heard.      No friction rub. No gallop.  Pulmonary:     Effort: Pulmonary effort is normal. No  respiratory distress.     Breath sounds: Normal breath sounds. No stridor. No wheezing, rhonchi or rales.  Musculoskeletal:        General: Normal range of motion.     Cervical back: Normal range of motion.  Skin:    General: Skin is warm and dry.  Neurological:     Mental Status: He is alert and oriented to person, place, and time. Mental status is at baseline.  Psychiatric:        Mood and Affect: Mood normal.        Behavior: Behavior normal.        Thought Content: Thought content normal.        Judgment: Judgment normal.    Lab Results  Component Value Date   TSH 0.597 04/09/2022   Lab Results  Component Value Date   WBC 11.8 (H) 04/09/2022   HGB 12.5 (L) 04/09/2022   HCT 39.9 04/09/2022   MCV 91.9 04/09/2022   PLT 256 04/09/2022   Lab Results  Component Value Date   NA 142 04/09/2022   K 3.8 04/09/2022   CO2 28 04/09/2022   GLUCOSE 116 (H) 04/09/2022   BUN 11 04/09/2022   CREATININE 1.06 04/09/2022   BILITOT 0.3 04/09/2022   ALKPHOS 65 04/09/2022   AST 19 04/09/2022   ALT 24 04/09/2022   PROT 6.7 04/09/2022   ALBUMIN 3.9 04/09/2022   CALCIUM 9.1 04/09/2022   ANIONGAP 7 04/09/2022   EGFR 76 01/10/2021   Lab Results  Component Value Date   CHOL 104 01/10/2022   Lab Results  Component Value Date   HDL 41 01/10/2022   Lab Results  Component Value Date   LDLCALC 26 01/10/2022   Lab Results  Component Value Date   TRIG 241 (H) 01/10/2022   Lab Results  Component Value Date   CHOLHDL 2.5 01/10/2022   Lab Results  Component Value Date   HGBA1C 7.5 (H) 01/10/2022           

## 2022-04-26 ENCOUNTER — Other Ambulatory Visit (HOSPITAL_COMMUNITY): Payer: Self-pay

## 2022-04-26 ENCOUNTER — Encounter: Payer: Self-pay | Admitting: Family Medicine

## 2022-04-26 DIAGNOSIS — F3340 Major depressive disorder, recurrent, in remission, unspecified: Secondary | ICD-10-CM | POA: Insufficient documentation

## 2022-04-27 ENCOUNTER — Telehealth: Payer: Self-pay | Admitting: Physician Assistant

## 2022-04-27 ENCOUNTER — Encounter (HOSPITAL_COMMUNITY): Payer: Self-pay

## 2022-04-27 ENCOUNTER — Ambulatory Visit (HOSPITAL_COMMUNITY)
Admission: RE | Admit: 2022-04-27 | Discharge: 2022-04-27 | Disposition: A | Discharge status: Home / Self Care | Payer: 59 | Source: Ambulatory Visit | Attending: Physician Assistant | Admitting: Physician Assistant

## 2022-04-27 DIAGNOSIS — K760 Fatty (change of) liver, not elsewhere classified: Secondary | ICD-10-CM | POA: Diagnosis not present

## 2022-04-27 DIAGNOSIS — R918 Other nonspecific abnormal finding of lung field: Secondary | ICD-10-CM | POA: Diagnosis not present

## 2022-04-27 DIAGNOSIS — C3490 Malignant neoplasm of unspecified part of unspecified bronchus or lung: Secondary | ICD-10-CM | POA: Diagnosis not present

## 2022-04-27 DIAGNOSIS — C3491 Malignant neoplasm of unspecified part of right bronchus or lung: Secondary | ICD-10-CM | POA: Insufficient documentation

## 2022-04-27 DIAGNOSIS — J439 Emphysema, unspecified: Secondary | ICD-10-CM | POA: Diagnosis not present

## 2022-04-27 DIAGNOSIS — N200 Calculus of kidney: Secondary | ICD-10-CM | POA: Diagnosis not present

## 2022-04-27 DIAGNOSIS — J90 Pleural effusion, not elsewhere classified: Secondary | ICD-10-CM | POA: Diagnosis not present

## 2022-04-27 MED ORDER — SODIUM CHLORIDE (PF) 0.9 % IJ SOLN
INTRAMUSCULAR | Status: AC
  Filled 2022-04-27: qty 50

## 2022-04-27 MED ORDER — IOHEXOL 300 MG/ML  SOLN
100.0000 mL | Freq: Once | INTRAMUSCULAR | Status: AC | PRN
  Administered 2022-04-27: 100 mL via INTRAVENOUS

## 2022-04-27 NOTE — Telephone Encounter (Signed)
I got the results of the patient's restaging CT scan which looks stable from a cancer standpoint.  The scan mentioned new right middle lobe changes which could be consistent with infection or aspiration.  I called the patient.  He states his breathing is "fine".  He denies any changes in shortness of breath or any unusual cough.  He reports he was throwing up Monday night and could potentially have had some aspiration.  I reviewed with Dr. Julien Nordmann today.  Since the patient does not have any symptoms, we will see him as scheduled on Monday.  The patient verbalized understanding.

## 2022-04-30 ENCOUNTER — Inpatient Hospital Stay: Payer: 59

## 2022-04-30 ENCOUNTER — Other Ambulatory Visit: Payer: Self-pay

## 2022-04-30 ENCOUNTER — Encounter: Payer: Self-pay | Admitting: Internal Medicine

## 2022-04-30 ENCOUNTER — Inpatient Hospital Stay (HOSPITAL_BASED_OUTPATIENT_CLINIC_OR_DEPARTMENT_OTHER): Payer: 59 | Admitting: Internal Medicine

## 2022-04-30 VITALS — BP 133/83 | HR 100 | Temp 98.1°F | Resp 18 | Ht 73.0 in | Wt 189.0 lb

## 2022-04-30 VITALS — BP 138/91 | HR 95 | Temp 97.9°F | Resp 18

## 2022-04-30 DIAGNOSIS — C7931 Secondary malignant neoplasm of brain: Secondary | ICD-10-CM

## 2022-04-30 DIAGNOSIS — C3491 Malignant neoplasm of unspecified part of right bronchus or lung: Secondary | ICD-10-CM

## 2022-04-30 DIAGNOSIS — C7951 Secondary malignant neoplasm of bone: Secondary | ICD-10-CM | POA: Diagnosis not present

## 2022-04-30 DIAGNOSIS — E538 Deficiency of other specified B group vitamins: Secondary | ICD-10-CM | POA: Diagnosis not present

## 2022-04-30 DIAGNOSIS — F17210 Nicotine dependence, cigarettes, uncomplicated: Secondary | ICD-10-CM | POA: Diagnosis not present

## 2022-04-30 DIAGNOSIS — Z7984 Long term (current) use of oral hypoglycemic drugs: Secondary | ICD-10-CM | POA: Diagnosis not present

## 2022-04-30 DIAGNOSIS — Z5111 Encounter for antineoplastic chemotherapy: Secondary | ICD-10-CM

## 2022-04-30 DIAGNOSIS — Z79899 Other long term (current) drug therapy: Secondary | ICD-10-CM | POA: Diagnosis not present

## 2022-04-30 DIAGNOSIS — C3411 Malignant neoplasm of upper lobe, right bronchus or lung: Secondary | ICD-10-CM | POA: Diagnosis not present

## 2022-04-30 DIAGNOSIS — Z5112 Encounter for antineoplastic immunotherapy: Secondary | ICD-10-CM

## 2022-04-30 LAB — CBC WITH DIFFERENTIAL (CANCER CENTER ONLY)
Abs Immature Granulocytes: 0.1 10*3/uL — ABNORMAL HIGH (ref 0.00–0.07)
Basophils Absolute: 0.1 10*3/uL (ref 0.0–0.1)
Basophils Relative: 1 %
Eosinophils Absolute: 0.1 10*3/uL (ref 0.0–0.5)
Eosinophils Relative: 1 %
HCT: 42.4 % (ref 39.0–52.0)
Hemoglobin: 13 g/dL (ref 13.0–17.0)
Immature Granulocytes: 1 %
Lymphocytes Relative: 12 %
Lymphs Abs: 1.3 10*3/uL (ref 0.7–4.0)
MCH: 28.9 pg (ref 26.0–34.0)
MCHC: 30.7 g/dL (ref 30.0–36.0)
MCV: 94.2 fL (ref 80.0–100.0)
Monocytes Absolute: 0.6 10*3/uL (ref 0.1–1.0)
Monocytes Relative: 5 %
Neutro Abs: 9.3 10*3/uL — ABNORMAL HIGH (ref 1.7–7.7)
Neutrophils Relative %: 80 %
Platelet Count: 252 10*3/uL (ref 150–400)
RBC: 4.5 MIL/uL (ref 4.22–5.81)
RDW: 17.5 % — ABNORMAL HIGH (ref 11.5–15.5)
WBC Count: 11.4 10*3/uL — ABNORMAL HIGH (ref 4.0–10.5)
nRBC: 0 % (ref 0.0–0.2)

## 2022-04-30 LAB — CMP (CANCER CENTER ONLY)
ALT: 26 U/L (ref 0–44)
AST: 19 U/L (ref 15–41)
Albumin: 3.8 g/dL (ref 3.5–5.0)
Alkaline Phosphatase: 64 U/L (ref 38–126)
Anion gap: 8 (ref 5–15)
BUN: 14 mg/dL (ref 8–23)
CO2: 28 mmol/L (ref 22–32)
Calcium: 9.4 mg/dL (ref 8.9–10.3)
Chloride: 106 mmol/L (ref 98–111)
Creatinine: 1.04 mg/dL (ref 0.61–1.24)
GFR, Estimated: >60 mL/min (ref >60)
Glucose, Bld: 141 mg/dL — ABNORMAL HIGH (ref 70–99)
Potassium: 4.2 mmol/L (ref 3.5–5.1)
Sodium: 142 mmol/L (ref 135–145)
Total Bilirubin: 0.3 mg/dL (ref 0.3–1.2)
Total Protein: 5.9 g/dL — ABNORMAL LOW (ref 6.5–8.1)

## 2022-04-30 LAB — TSH: TSH: 1.014 u[IU]/mL (ref 0.350–4.500)

## 2022-04-30 MED ORDER — PROCHLORPERAZINE MALEATE 10 MG PO TABS
10.0000 mg | ORAL_TABLET | Freq: Once | ORAL | Status: AC
  Administered 2022-04-30: 10 mg via ORAL
  Filled 2022-04-30: qty 1

## 2022-04-30 MED ORDER — SODIUM CHLORIDE 0.9 % IV SOLN
200.0000 mg | Freq: Once | INTRAVENOUS | Status: AC
  Administered 2022-04-30: 200 mg via INTRAVENOUS
  Filled 2022-04-30: qty 200

## 2022-04-30 MED ORDER — CYANOCOBALAMIN 1000 MCG/ML IJ SOLN
1000.0000 ug | Freq: Once | INTRAMUSCULAR | Status: AC
  Administered 2022-04-30: 1000 ug via INTRAMUSCULAR
  Filled 2022-04-30: qty 1

## 2022-04-30 MED ORDER — SODIUM CHLORIDE 0.9 % IV SOLN
500.0000 mg/m2 | Freq: Once | INTRAVENOUS | Status: AC
  Administered 2022-04-30: 1000 mg via INTRAVENOUS
  Filled 2022-04-30: qty 40

## 2022-04-30 MED ORDER — SODIUM CHLORIDE 0.9 % IV SOLN: Freq: Once | INTRAVENOUS | Status: AC

## 2022-04-30 NOTE — Progress Notes (Signed)
[facility] Telephone:(336) [phone]   Fax:(336) [phone]  OFFICE PROGRESS NOTE  [name], [place] [address]  DIAGNOSIS: Stage IV (T1b, N3, M1C) non-small cell lung cancer, favoring adenocarcinoma presented with right upper lobe lung nodule in addition to right hilar, subcarinal and bilateral mediastinal as well as supraclavicular lymphadenopathy in addition to bone and brain metastasis diagnosed in June 2022.     PD-L1 expression 80%.     Molecular Studies:  Biomarker Findings Microsatellite status - MS-Stable Tumor Mutational Burden - 6 Muts/Mb Genomic Findings For a complete list of the genes assayed, please refer to the Appendix. KRAS G12C, amplification ATM S470* CCND1 amplification - equivocal? HGF amplification - equivocal? MYC amplification - equivocal? FGF19 amplification - equivocal? FGF3 amplification - equivocal? FGF4 amplification - equivocal? NFKBIA amplification NKX2-1 amplification RAD21 amplification - equivocal? [id number]*26 TERT promoter -124C>T TP53 rearrangement exon 9 7 Disease relevant genes with no reportable alterations: ALK, BRAF, EGFR, ERBB2, MET, RET, ROS1   PRIOR THERAPY: SRS to the metastatic brain lesions under the care of Dr. MTammi Klippel  Last treatment on 05/04/2021.   CURRENT THERAPY: Palliative systemic chemotherapy with carboplatin for an AUC 5, Alimta 500 mg/m2 and, Keytruda 200 mg IV every 3 weeks.  First dose expected on 05/08/2021.  Status post 17 cycles.  Starting from cycle #5 he is on maintenance treatment with Alimta and Keytruda every 3 weeks.  INTERVAL HISTORY: Bradley Goodman 66y.o. male returns to the clinic today for follow-up visit accompanied by his wife.  The patient is feeling fine today with no concerning complaints.  He denied having any current chest pain, shortness of breath, cough or hemoptysis.  He has no nausea, vomiting, diarrhea or constipation.  He denied  having any headache or visual changes.  He has no weight loss or night sweats.  He continues to tolerate his maintenance treatment with Alimta and Keytruda fairly well.  The patient had repeat CT scan of the chest, abdomen and pelvis performed recently and he is here for evaluation and discussion of his scan results.  MEDICAL HISTORY: Past Medical History:  Diagnosis Date   Cervical spondylolysis    Essential hypertension    GERD (gastroesophageal reflux disease)    History of kidney stones    History of migraine    Hyperlipidemia    Hypertension    PONV (postoperative nausea and vomiting)    Type 2 diabetes mellitus (HCC)     ALLERGIES:  has No Known Allergies.  MEDICATIONS:  Current Outpatient Medications  Medication Sig Dispense Refill   cholecalciferol (VITAMIN D) 1000 UNITS tablet Take 1,000 Units by mouth every evening.     Continuous Blood Gluc Sensor (FREESTYLE LIBRE 3 SENSOR) MISC Place 1 sensor on the skin every 14 days. Use to check glucose continuously 2 each 2   dexamethasone (DECADRON) 1 MG tablet Take 2 tablets by mouth daily. 180 tablet 1   docusate sodium (COLACE) 100 MG capsule Take 100 mg by mouth daily.     folic acid (FOLVITE) 1 MG tablet Take 1 tablet by mouth daily. 30 tablet 4   Krill Oil 300 MG CAPS Take 300 mg by mouth every evening.     levothyroxine (SYNTHROID) 50 MCG tablet Take 1 tablet (50 mcg total) by mouth daily before breakfast. 90 tablet 1   loratadine (CLARITIN) 10 MG tablet Take 10 mg by mouth every evening.      losartan (COZAAR) 50  MG tablet Take 1 and 1/2 tablets (75 mg total) by mouth daily. 135 tablet 0   mirtazapine (REMERON) 15 MG tablet Take 1 tablet by mouth at bedtime. 90 tablet 1   Multiple Vitamins-Minerals (MULTIVITAMIN GUMMIES ADULT PO) Take 2 each by mouth daily.     pantoprazole (PROTONIX) 40 MG tablet Take 1 tablet by mouth every evening. 90 tablet 1   PRESCRIPTION MEDICATION Testosterone injection     prochlorperazine  (COMPAZINE) 10 MG tablet Take 1 tablet (10 mg total) by mouth every 6 (six) hours as needed for nausea or vomiting. 30 tablet 0   rosuvastatin (CRESTOR) 20 MG tablet Take 1 tablet by mouth daily. 90 tablet 1   Semaglutide,0.25 or 0.5MG/DOS, (OZEMPIC, 0.25 OR 0.5 MG/DOSE,) 2 MG/3ML SOPN Inject 0.5 mg into the skin once a week. 3 mL 2   SYRINGE-NEEDLE, DISP, 3 ML 21G X 1-1/2" 3 ML MISC Use to inject testosterone every week 100 each 2   Testosterone 20.25 MG/ACT (1.62%) GEL Apply 1 pump on each shoulder every morning 75 g 0   venlafaxine XR (EFFEXOR-XR) 75 MG 24 hr capsule Take 1 capsule by mouth daily with breakfast. 90 capsule 1   No current facility-administered medications for this visit.    SURGICAL HISTORY:  Past Surgical History:  Procedure Laterality Date   Bilateral inguinal hernia repair     BRONCHIAL NEEDLE ASPIRATION BIOPSY  04/05/2021   Procedure: BRONCHIAL NEEDLE ASPIRATION BIOPSIES;  Surgeon: Garner Nash, DO;  Location: [facility];  Service: Pulmonary;;   COLONOSCOPY  01/23/2012   Procedure: COLONOSCOPY;  Surgeon: Daneil Dolin, MD;  [facility];  Service: Endoscopy;  Laterality: N/A;  9:30 AM   COLONOSCOPY N/A 10/11/2015   Procedure: COLONOSCOPY;  Surgeon: Daneil Dolin, MD;  [facility];  Service: Endoscopy;  Laterality: N/A;  830   COLONOSCOPY N/A 10/14/2019   Procedure: COLONOSCOPY;  Surgeon: Daneil Dolin, MD;  [facility];  Service: Endoscopy;  Laterality: N/A;  1:45   POLYPECTOMY  10/14/2019   Procedure: POLYPECTOMY;  Surgeon: Daneil Dolin, MD;  [facility];  Service: Endoscopy;;  ascending colon, descending colon   VIDEO BRONCHOSCOPY WITH ENDOBRONCHIAL ULTRASOUND N/A 04/05/2021   Procedure: VIDEO BRONCHOSCOPY WITH ENDOBRONCHIAL ULTRASOUND;  Surgeon: Garner Nash, DO;  Location: [facility];  Service: Pulmonary;  Laterality: N/A;    REVIEW OF SYSTEMS:  Constitutional: negative Eyes: negative Ears, nose, mouth,  throat, and face: negative Respiratory: negative Cardiovascular: negative Gastrointestinal: negative Genitourinary:negative Integument/breast: negative Hematologic/lymphatic: negative Musculoskeletal:negative Neurological: negative Behavioral/Psych: negative Endocrine: negative Allergic/Immunologic: negative   PHYSICAL EXAMINATION: General appearance: alert, cooperative, appears stated age, and no distress Head: Normocephalic, without obvious abnormality, atraumatic Neck: no adenopathy, no JVD, supple, symmetrical, trachea midline, and thyroid not enlarged, symmetric, no tenderness/mass/nodules Lymph nodes: Cervical, supraclavicular, and axillary nodes normal. Resp: clear to auscultation bilaterally Back: symmetric, no curvature. ROM normal. No CVA tenderness. Cardio: regular rate and rhythm, S1, S2 normal, no murmur, click, rub or gallop GI: soft, non-tender; bowel sounds normal; no masses,  no organomegaly Extremities: extremities normal, atraumatic, no cyanosis or edema Neurologic: Alert and oriented X 3, normal strength and tone. Normal symmetric reflexes. Normal coordination and gait  ECOG PERFORMANCE STATUS: 1 - Symptomatic but completely ambulatory  Blood pressure 133/83, pulse 100, temperature 98.1 F (36.7 C), temperature source Oral, resp. rate 18, height '6\' 1"'  (1.854 m), weight 189 lb (85.7 kg), SpO2 97 %.  LABORATORY DATA: Lab Results  Component Value Date   WBC 11.4 (H) 04/30/2022   HGB 13.0 04/30/2022   HCT 42.4 04/30/2022   MCV 94.2 04/30/2022   PLT 252 04/30/2022      Chemistry      Component Value Date/Time   NA 142 04/09/2022 1042   NA 144 01/10/2021 0849   K 3.8 04/09/2022 1042   CL 107 04/09/2022 1042   CO2 28 04/09/2022 1042   BUN 11 04/09/2022 1042   BUN 15 01/10/2021 0849   CREATININE 1.06 04/09/2022 1042      Component Value Date/Time   CALCIUM 9.1 04/09/2022 1042   ALKPHOS 65 04/09/2022 1042   AST 19 04/09/2022 1042   ALT 24 04/09/2022  1042   BILITOT 0.3 04/09/2022 1042       RADIOGRAPHIC STUDIES: CT Chest W Contrast  Result Date: 04/27/2022 CLINICAL DATA:  Primary Cancer Type: Lung Imaging Indication: Assess response to therapy Interval therapy since last imaging? Yes Initial Cancer Diagnosis Date: 04/05/2021; Established by: Biopsy-proven Detailed Pathology: Stage IV non-small cell lung cancer, adenocarcinoma. Primary Tumor location:  Right upper lobe. Brain metastasis. Surgeries: No. Chemotherapy: Yes; Ongoing? Yes; Most recent administration: 04/09/2022 Immunotherapy?  Yes; Type: Keytruda; Ongoing? Yes Radiation therapy? Yes; Date Range: 04/24/2021 - 05/04/2021; Target: Brain * Tracking Code: BO * EXAM: CT CHEST, ABDOMEN, AND PELVIS WITH CONTRAST TECHNIQUE: Multidetector CT imaging of the chest, abdomen and pelvis was performed following the standard protocol during bolus administration of intravenous contrast. RADIATION DOSE REDUCTION: This exam was performed according to the departmental dose-optimization program which includes automated exposure control, adjustment of the mA and/or kV according to patient size and/or use of iterative reconstruction technique. CONTRAST:  156m OMNIPAQUE IOHEXOL 300 MG/ML SOLN additional oral enteric contrast COMPARISON:  Most recent CT chest, abdomen and pelvis 01/30/2022. FINDINGS: CT CHEST FINDINGS Cardiovascular: Aortic atherosclerosis. Normal heart size. Left coronary artery calcifications. No pericardial effusion. Mediastinum/Nodes: No enlarged mediastinal, hilar, or axillary lymph nodes. Thyroid gland, trachea, and esophagus demonstrate no significant findings. Lungs/Pleura: Mild paraseptal emphysema. New, heterogeneous nodular and consolidative airspace disease of the central right middle lobe (series 6, image 89). Multiple small bilateral pulmonary nodules are again seen, and unchanged compared to prior examination, with specific attention to previously new nodules of the anterior right  upper lobe measuring 0.8 cm (series 6, image 46) and of the anterior left upper lobe measuring 0.4 cm (series 6, image 59). Background of very fine centrilobular pulmonary nodules throughout the lungs, most concentrated in the lung apices. New, trace right pleural effusion. Musculoskeletal: No chest wall mass or suspicious osseous lesions identified. CT ABDOMEN PELVIS FINDINGS Hepatobiliary: No solid liver abnormality is seen. Unchanged small low-attenuation lesions scattered throughout the liver, generally subcentimeter and previously characterized as cysts or hemangiomata by prior MR. Hepatic steatosis. No gallstones, gallbladder wall thickening, or biliary dilatation. Pancreas: Unremarkable. No pancreatic ductal dilatation or surrounding inflammatory changes. Spleen: Normal in size without significant abnormality. Adrenals/Urinary Tract: Adrenal glands are unremarkable. Nonobstructive calculus of the superior pole of the left kidney. No right-sided calculi, ureteral calculi, or hydronephrosis. Bladder is unremarkable. Stomach/Bowel: Stomach is within normal limits. Appendix appears normal. No evidence of bowel wall thickening, distention, or inflammatory changes. Vascular/Lymphatic: Aortic atherosclerosis. No enlarged abdominal or pelvic lymph nodes. Reproductive: No mass or other abnormality. Other: Status post bilateral inguinal hernia repair. Trace ascites, new compared to prior examination. Musculoskeletal: No acute osseous findings. IMPRESSION: 1. New, heterogeneous nodular and consolidative airspace disease of the central right middle lobe, consistent with  infection or aspiration. 2. Multiple small bilateral pulmonary nodules are again seen, and unchanged compared to prior examination, with specific attention to previously new nodules of the bilateral upper lobes. Attention on follow-up. 3. Emphysema with a background of very fine centrilobular pulmonary nodules throughout the lungs, most concentrated in  the lung apices, consistent with smoking-related respiratory bronchiolitis. 4. New, trace right pleural effusion. 5. New trace ascites. 6. Coronary artery disease. 7. Hepatic steatosis. 8. Nonobstructive left nephrolithiasis. Aortic Atherosclerosis (ICD10-I70.0) and Emphysema (ICD10-J43.9). Electronically Signed   By: Delanna Ahmadi M.D.   On: 04/27/2022 12:02   CT Abdomen Pelvis W Contrast  Result Date: 04/27/2022 CLINICAL DATA:  Primary Cancer Type: Lung Imaging Indication: Assess response to therapy Interval therapy since last imaging? Yes Initial Cancer Diagnosis Date: 04/05/2021; Established by: Biopsy-proven Detailed Pathology: Stage IV non-small cell lung cancer, adenocarcinoma. Primary Tumor location:  Right upper lobe. Brain metastasis. Surgeries: No. Chemotherapy: Yes; Ongoing? Yes; Most recent administration: 04/09/2022 Immunotherapy?  Yes; Type: Keytruda; Ongoing? Yes Radiation therapy? Yes; Date Range: 04/24/2021 - 05/04/2021; Target: Brain * Tracking Code: BO * EXAM: CT CHEST, ABDOMEN, AND PELVIS WITH CONTRAST TECHNIQUE: Multidetector CT imaging of the chest, abdomen and pelvis was performed following the standard protocol during bolus administration of intravenous contrast. RADIATION DOSE REDUCTION: This exam was performed according to the departmental dose-optimization program which includes automated exposure control, adjustment of the mA and/or kV according to patient size and/or use of iterative reconstruction technique. CONTRAST:  161m OMNIPAQUE IOHEXOL 300 MG/ML SOLN additional oral enteric contrast COMPARISON:  Most recent CT chest, abdomen and pelvis 01/30/2022. FINDINGS: CT CHEST FINDINGS Cardiovascular: Aortic atherosclerosis. Normal heart size. Left coronary artery calcifications. No pericardial effusion. Mediastinum/Nodes: No enlarged mediastinal, hilar, or axillary lymph nodes. Thyroid gland, trachea, and esophagus demonstrate no significant findings. Lungs/Pleura: Mild paraseptal  emphysema. New, heterogeneous nodular and consolidative airspace disease of the central right middle lobe (series 6, image 89). Multiple small bilateral pulmonary nodules are again seen, and unchanged compared to prior examination, with specific attention to previously new nodules of the anterior right upper lobe measuring 0.8 cm (series 6, image 46) and of the anterior left upper lobe measuring 0.4 cm (series 6, image 59). Background of very fine centrilobular pulmonary nodules throughout the lungs, most concentrated in the lung apices. New, trace right pleural effusion. Musculoskeletal: No chest wall mass or suspicious osseous lesions identified. CT ABDOMEN PELVIS FINDINGS Hepatobiliary: No solid liver abnormality is seen. Unchanged small low-attenuation lesions scattered throughout the liver, generally subcentimeter and previously characterized as cysts or hemangiomata by prior MR. Hepatic steatosis. No gallstones, gallbladder wall thickening, or biliary dilatation. Pancreas: Unremarkable. No pancreatic ductal dilatation or surrounding inflammatory changes. Spleen: Normal in size without significant abnormality. Adrenals/Urinary Tract: Adrenal glands are unremarkable. Nonobstructive calculus of the superior pole of the left kidney. No right-sided calculi, ureteral calculi, or hydronephrosis. Bladder is unremarkable. Stomach/Bowel: Stomach is within normal limits. Appendix appears normal. No evidence of bowel wall thickening, distention, or inflammatory changes. Vascular/Lymphatic: Aortic atherosclerosis. No enlarged abdominal or pelvic lymph nodes. Reproductive: No mass or other abnormality. Other: Status post bilateral inguinal hernia repair. Trace ascites, new compared to prior examination. Musculoskeletal: No acute osseous findings. IMPRESSION: 1. New, heterogeneous nodular and consolidative airspace disease of the central right middle lobe, consistent with infection or aspiration. 2. Multiple small bilateral  pulmonary nodules are again seen, and unchanged compared to prior examination, with specific attention to previously new nodules of the bilateral upper lobes. Attention on follow-up. 3.  Emphysema with a background of very fine centrilobular pulmonary nodules throughout the lungs, most concentrated in the lung apices, consistent with smoking-related respiratory bronchiolitis. 4. New, trace right pleural effusion. 5. New trace ascites. 6. Coronary artery disease. 7. Hepatic steatosis. 8. Nonobstructive left nephrolithiasis. Aortic Atherosclerosis (ICD10-I70.0) and Emphysema (ICD10-J43.9). Electronically Signed   By: Delanna Ahmadi M.D.   On: 04/27/2022 12:02    ASSESSMENT AND PLAN: This is a very pleasant 66 years old white male recently diagnosed with stage IV (T1b, N3, M1 C) non-small cell lung cancer favoring adenocarcinoma presented with right upper lobe lung nodule in addition to right hilar, subcarinal and bilateral mediastinal as well as supraclavicular lymphadenopathy.  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  His PD-L1 expression is 80% and his molecular studies showed KRAS G12C mutation. The patient underwent SRS to metastatic brain lesion under the care of Dr. Tammi Klippel and he is currently undergoing systemic chemotherapy with carboplatin for AUC of 5, Alimta 500 Mg/M2 and Keytruda 200 Mg IV every 3 weeks status post 17 cycles.  Starting from cycle #5 the patient will be treated with maintenance treatment with Alimta and Keytruda every 3 weeks. The patient has been tolerating this treatment well with no concerning adverse effects. He had repeat CT scan of the chest, abdomen and pelvis performed recently.  I personally and independently reviewed the scan images and discussed the result with the patient and his wife. His scan showed no concerning findings for disease progression.  He has some new heterogeneous nodular and consolidative airspace disease in the central middle  lobe concerning to be inflammatory in origin and will continue to monitor it closely.  The patient is currently asymptomatic. He will proceed with cycle #18 today as planned. I will see him back for follow-up visit in 3 weeks for evaluation before the next cycle of his treatment. The patient was advised to call immediately if he has any other concerning symptoms in the interval. The patient voices understanding of current disease status and treatment options and is in agreement with the current care plan.  All questions were answered. The patient knows to call the clinic with any problems, questions or concerns. We can certainly see the patient much sooner if necessary.  Disclaimer: This note was dictated with voice recognition software. Similar sounding words can inadvertently be transcribed and may not be corrected upon review.

## 2022-04-30 NOTE — Patient Instructions (Signed)
[facility]   Discharge Instructions: Thank you for choosing [facility] to provide your oncology and hematology care.   If you have a lab appointment with the [facility], please go directly to the [facility] and check in at the registration area.   Wear comfortable clothing and clothing appropriate for easy access to any Portacath or PICC line.   We strive to give you quality time with your provider. You may need to reschedule your appointment if you arrive late (15 or more minutes).  Arriving late affects you and other patients whose appointments are after yours.  Also, if you miss three or more appointments without notifying the office, you may be dismissed from the clinic at the provider's discretion.      For prescription refill requests, have your pharmacy contact our office and allow 72 hours for refills to be completed.    Today you received the following chemotherapy and/or immunotherapy agents: pembrolizumab and pemetrexed      To help prevent nausea and vomiting after your treatment, we encourage you to take your nausea medication as directed.  BELOW ARE SYMPTOMS THAT SHOULD BE REPORTED IMMEDIATELY: *FEVER GREATER THAN 100.4 F (38 C) OR HIGHER *CHILLS OR SWEATING *NAUSEA AND VOMITING THAT IS NOT CONTROLLED WITH YOUR NAUSEA MEDICATION *UNUSUAL SHORTNESS OF BREATH *UNUSUAL BRUISING OR BLEEDING *URINARY PROBLEMS (pain or burning when urinating, or frequent urination) *BOWEL PROBLEMS (unusual diarrhea, constipation, pain near the anus) TENDERNESS IN MOUTH AND THROAT WITH OR WITHOUT PRESENCE OF ULCERS (sore throat, sores in mouth, or a toothache) UNUSUAL RASH, SWELLING OR PAIN  UNUSUAL VAGINAL DISCHARGE OR ITCHING   Items with * indicate a potential emergency and should be followed up as soon as possible or go to the Emergency Department if any problems should occur.  Please show the CHEMOTHERAPY ALERT CARD or IMMUNOTHERAPY ALERT  CARD at check-in to the Emergency Department and triage nurse.  Should you have questions after your visit or need to cancel or reschedule your appointment, please contact [facility]  Dept: [phone]  and follow the prompts.  Office hours are 8:00 a.m. to 4:30 p.m. Monday - Friday. Please note that voicemails left after 4:00 p.m. may not be returned until the following business day.  We are closed weekends and major holidays. You have access to a nurse at all times for urgent questions. Please call the main number to the clinic Dept: [phone] and follow the prompts.   For any non-urgent questions, you may also contact your provider using MyChart. We now offer e-Visits for anyone 12 and older to request care online for non-urgent symptoms. For details visit [email or web address].   Also download the MyChart app! Go to the app store, search "MyChart", open the app, select [facility], and log in with your MyChart username and password.  Masks are optional in the cancer centers. If you would like for your care team to wear a mask while they are taking care of you, please let them know. For doctor visits, patients may have with them one support person who is at least 65 years old. At this time, visitors are not allowed in the infusion area.

## 2022-04-30 NOTE — Patient Instructions (Signed)
Steps to Quit Smoking Smoking tobacco is the leading cause of preventable death. It can affect almost every organ in the body. Smoking puts you and people around you at risk for many serious, long-lasting (chronic) diseases. Quitting smoking can be hard, but it is one of the best things that you can do for your health. It is never too late to quit. Do not give up if you cannot quit the first time. Some people need to try many times to quit. Do your best to stick to your quit plan, and talk with your doctor if you have any questions or concerns. How do I get ready to quit? Pick a date to quit. Set a date within the next 2 weeks to give you time to prepare. Write down the reasons why you are quitting. Keep this list in places where you will see it often. Tell your family, friends, and co-workers that you are quitting. Their support is important. Talk with your doctor about the choices that may help you quit. Find out if your health insurance will pay for these treatments. Know the people, places, things, and activities that make you want to smoke (triggers). Avoid them. What first steps can I take to quit smoking? Throw away all cigarettes at home, at work, and in your car. Throw away the things that you use when you smoke, such as ashtrays and lighters. Clean your car. Empty the ashtray. Clean your home, including curtains and carpets. What can I do to help me quit smoking? Talk with your doctor about taking medicines and seeing a counselor. You are more likely to succeed when you do both. If you are pregnant or breastfeeding: Talk with your doctor about counseling or other ways to quit smoking. Do not take medicine to help you quit smoking unless your doctor tells you to. Quit right away Quit smoking completely, instead of slowly cutting back on how much you smoke over a period of time. Stopping smoking right away may be more successful than slowly quitting. Go to counseling. In-person is best  if this is an option. You are more likely to quit if you go to counseling sessions regularly. Take medicine You may take medicines to help you quit. Some medicines need a prescription, and some you can buy over-the-counter. Some medicines may contain a drug called nicotine to replace the nicotine in cigarettes. Medicines may: Help you stop having the desire to smoke (cravings). Help to stop the problems that come when you stop smoking (withdrawal symptoms). Your doctor may ask you to use: Nicotine patches, gum, or lozenges. Nicotine inhalers or sprays. Non-nicotine medicine that you take by mouth. Find resources Find resources and other ways to help you quit smoking and remain smoke-free after you quit. They include: Online chats with a counselor. Phone quitlines. Printed self-help materials. Support groups or group counseling. Text messaging programs. Mobile phone apps. Use apps on your mobile phone or tablet that can help you stick to your quit plan. Examples of free services include Quit Guide from the CDC and smokefree.gov  What can I do to make it easier to quit?  Talk to your family and friends. Ask them to support and encourage you. Call a phone quitline, such as 1-800-QUIT-NOW, reach out to support groups, or work with a counselor. Ask people who smoke to not smoke around you. Avoid places that make you want to smoke, such as: Bars. Parties. Smoke-break areas at work. Spend time with people who do not smoke. Lower   the stress in your life. Stress can make you want to smoke. Try these things to lower stress: Getting regular exercise. Doing deep-breathing exercises. Doing yoga. Meditating. What benefits will I see if I quit smoking? Over time, you may have: A better sense of smell and taste. Less coughing and sore throat. A slower heart rate. Lower blood pressure. Clearer skin. Better breathing. Fewer sick days. Summary Quitting smoking can be hard, but it is one of  the best things that you can do for your health. Do not give up if you cannot quit the first time. Some people need to try many times to quit. When you decide to quit smoking, make a plan to help you succeed. Quit smoking right away, not slowly over a period of time. When you start quitting, get help and support to keep you smoke-free. This information is not intended to replace advice given to you by your health care provider. Make sure you discuss any questions you have with your health care provider. Document Revised: 09/15/2021 Document Reviewed: 09/15/2021 Elsevier Patient Education  2023 [employer].  

## 2022-05-02 ENCOUNTER — Other Ambulatory Visit (HOSPITAL_COMMUNITY): Payer: Self-pay

## 2022-05-02 ENCOUNTER — Other Ambulatory Visit: Payer: Self-pay

## 2022-05-08 ENCOUNTER — Other Ambulatory Visit (HOSPITAL_COMMUNITY): Payer: Self-pay

## 2022-05-08 ENCOUNTER — Other Ambulatory Visit: Payer: Self-pay | Admitting: Radiation Therapy

## 2022-05-13 ENCOUNTER — Other Ambulatory Visit (HOSPITAL_COMMUNITY): Payer: Self-pay

## 2022-05-14 ENCOUNTER — Other Ambulatory Visit (HOSPITAL_COMMUNITY): Payer: Self-pay

## 2022-05-15 ENCOUNTER — Telehealth: Payer: Self-pay | Admitting: "Endocrinology

## 2022-05-15 ENCOUNTER — Other Ambulatory Visit (HOSPITAL_COMMUNITY): Payer: Self-pay

## 2022-05-15 NOTE — Telephone Encounter (Signed)
Monica with [facility] stated [facility] is switching brands of levothyroxine from [employer] brand to [facility] brand. Is there any reason pt should not be switched?

## 2022-05-15 NOTE — Telephone Encounter (Signed)
Bradley Goodman left a VM stating they are switching brands of the Levothyroxine to something else for all patients, so she said they are needing permission to do this. I did not understand the name she left of the medication, something I have never heard of. Can you give them a call at [phone]

## 2022-05-15 NOTE — Telephone Encounter (Signed)
[facility] aware.

## 2022-05-16 ENCOUNTER — Other Ambulatory Visit: Payer: Self-pay | Admitting: Family Medicine

## 2022-05-16 ENCOUNTER — Other Ambulatory Visit (HOSPITAL_COMMUNITY): Payer: Self-pay

## 2022-05-16 DIAGNOSIS — E1165 Type 2 diabetes mellitus with hyperglycemia: Secondary | ICD-10-CM

## 2022-05-16 MED ORDER — OZEMPIC (0.25 OR 0.5 MG/DOSE) 2 MG/3ML ~~LOC~~ SOPN
0.5000 mg | PEN_INJECTOR | SUBCUTANEOUS | 0 refills | Status: DC
  Filled 2022-05-16 – 2022-05-22 (×2): qty 9, 84d supply, fill #0

## 2022-05-21 ENCOUNTER — Inpatient Hospital Stay (HOSPITAL_BASED_OUTPATIENT_CLINIC_OR_DEPARTMENT_OTHER): Payer: 59 | Admitting: Internal Medicine

## 2022-05-21 ENCOUNTER — Other Ambulatory Visit: Payer: Self-pay

## 2022-05-21 ENCOUNTER — Inpatient Hospital Stay: Payer: 59 | Attending: Physician Assistant

## 2022-05-21 ENCOUNTER — Encounter: Payer: Self-pay | Admitting: Internal Medicine

## 2022-05-21 ENCOUNTER — Inpatient Hospital Stay: Payer: 59

## 2022-05-21 VITALS — BP 148/97 | HR 108 | Temp 98.3°F | Resp 15 | Ht 71.5 in | Wt 190.1 lb

## 2022-05-21 VITALS — BP 138/78 | HR 98 | Temp 98.2°F | Resp 18

## 2022-05-21 DIAGNOSIS — F17210 Nicotine dependence, cigarettes, uncomplicated: Secondary | ICD-10-CM | POA: Diagnosis not present

## 2022-05-21 DIAGNOSIS — C3411 Malignant neoplasm of upper lobe, right bronchus or lung: Secondary | ICD-10-CM | POA: Insufficient documentation

## 2022-05-21 DIAGNOSIS — C3491 Malignant neoplasm of unspecified part of right bronchus or lung: Secondary | ICD-10-CM

## 2022-05-21 DIAGNOSIS — C7951 Secondary malignant neoplasm of bone: Secondary | ICD-10-CM | POA: Diagnosis not present

## 2022-05-21 DIAGNOSIS — C7931 Secondary malignant neoplasm of brain: Secondary | ICD-10-CM | POA: Insufficient documentation

## 2022-05-21 DIAGNOSIS — Z5111 Encounter for antineoplastic chemotherapy: Secondary | ICD-10-CM

## 2022-05-21 DIAGNOSIS — Z79899 Other long term (current) drug therapy: Secondary | ICD-10-CM | POA: Diagnosis not present

## 2022-05-21 DIAGNOSIS — Z5112 Encounter for antineoplastic immunotherapy: Secondary | ICD-10-CM

## 2022-05-21 DIAGNOSIS — I10 Essential (primary) hypertension: Secondary | ICD-10-CM | POA: Diagnosis not present

## 2022-05-21 DIAGNOSIS — Z923 Personal history of irradiation: Secondary | ICD-10-CM | POA: Insufficient documentation

## 2022-05-21 LAB — CMP (CANCER CENTER ONLY)
ALT: 21 U/L (ref 0–44)
AST: 17 U/L (ref 15–41)
Albumin: 3.8 g/dL (ref 3.5–5.0)
Alkaline Phosphatase: 62 U/L (ref 38–126)
Anion gap: 7 (ref 5–15)
BUN: 18 mg/dL (ref 8–23)
CO2: 26 mmol/L (ref 22–32)
Calcium: 8.7 mg/dL — ABNORMAL LOW (ref 8.9–10.3)
Chloride: 107 mmol/L (ref 98–111)
Creatinine: 1.1 mg/dL (ref 0.61–1.24)
GFR, Estimated: >60 mL/min (ref >60)
Glucose, Bld: 174 mg/dL — ABNORMAL HIGH (ref 70–99)
Potassium: 3.6 mmol/L (ref 3.5–5.1)
Sodium: 140 mmol/L (ref 135–145)
Total Bilirubin: 0.3 mg/dL (ref 0.3–1.2)
Total Protein: 6.3 g/dL — ABNORMAL LOW (ref 6.5–8.1)

## 2022-05-21 LAB — CBC WITH DIFFERENTIAL (CANCER CENTER ONLY)
Abs Immature Granulocytes: 0.11 10*3/uL — ABNORMAL HIGH (ref 0.00–0.07)
Basophils Absolute: 0.1 10*3/uL (ref 0.0–0.1)
Basophils Relative: 1 %
Eosinophils Absolute: 0.1 10*3/uL (ref 0.0–0.5)
Eosinophils Relative: 1 %
HCT: 39 % (ref 39.0–52.0)
Hemoglobin: 12.5 g/dL — ABNORMAL LOW (ref 13.0–17.0)
Immature Granulocytes: 1 %
Lymphocytes Relative: 10 %
Lymphs Abs: 1.2 10*3/uL (ref 0.7–4.0)
MCH: 28.9 pg (ref 26.0–34.0)
MCHC: 32.1 g/dL (ref 30.0–36.0)
MCV: 90.1 fL (ref 80.0–100.0)
Monocytes Absolute: 0.6 10*3/uL (ref 0.1–1.0)
Monocytes Relative: 5 %
Neutro Abs: 9.7 10*3/uL — ABNORMAL HIGH (ref 1.7–7.7)
Neutrophils Relative %: 82 %
Platelet Count: 276 10*3/uL (ref 150–400)
RBC: 4.33 MIL/uL (ref 4.22–5.81)
RDW: 17.5 % — ABNORMAL HIGH (ref 11.5–15.5)
WBC Count: 11.7 10*3/uL — ABNORMAL HIGH (ref 4.0–10.5)
nRBC: 0 % (ref 0.0–0.2)

## 2022-05-21 LAB — TSH: TSH: 0.969 u[IU]/mL (ref 0.350–4.500)

## 2022-05-21 MED ORDER — PROCHLORPERAZINE MALEATE 10 MG PO TABS
10.0000 mg | ORAL_TABLET | Freq: Once | ORAL | Status: AC
  Administered 2022-05-21: 10 mg via ORAL

## 2022-05-21 MED ORDER — SODIUM CHLORIDE 0.9 % IV SOLN
200.0000 mg | Freq: Once | INTRAVENOUS | Status: AC
  Administered 2022-05-21: 200 mg via INTRAVENOUS
  Filled 2022-05-21: qty 200

## 2022-05-21 MED ORDER — SODIUM CHLORIDE 0.9 % IV SOLN: Freq: Once | INTRAVENOUS | Status: AC

## 2022-05-21 MED ORDER — SODIUM CHLORIDE 0.9 % IV SOLN
500.0000 mg/m2 | Freq: Once | INTRAVENOUS | Status: AC
  Administered 2022-05-21: 1000 mg via INTRAVENOUS
  Filled 2022-05-21: qty 40

## 2022-05-21 NOTE — Progress Notes (Signed)
Per Dr. Julien Nordmann ,it is ok to treat pt with Pembrolizumab and Pemetrexed and pulse of 108.

## 2022-05-21 NOTE — Patient Instructions (Signed)
[facility]   Discharge Instructions: Thank you for choosing [facility] to provide your oncology and hematology care.   If you have a lab appointment with the [facility], please go directly to the [facility] and check in at the registration area.   Wear comfortable clothing and clothing appropriate for easy access to any Portacath or PICC line.   We strive to give you quality time with your provider. You may need to reschedule your appointment if you arrive late (15 or more minutes).  Arriving late affects you and other patients whose appointments are after yours.  Also, if you miss three or more appointments without notifying the office, you may be dismissed from the clinic at the provider's discretion.      For prescription refill requests, have your pharmacy contact our office and allow 72 hours for refills to be completed.    Today you received the following chemotherapy and/or immunotherapy agents: pembrolizumab and pemetrexed      To help prevent nausea and vomiting after your treatment, we encourage you to take your nausea medication as directed.  BELOW ARE SYMPTOMS THAT SHOULD BE REPORTED IMMEDIATELY: *FEVER GREATER THAN 100.4 F (38 C) OR HIGHER *CHILLS OR SWEATING *NAUSEA AND VOMITING THAT IS NOT CONTROLLED WITH YOUR NAUSEA MEDICATION *UNUSUAL SHORTNESS OF BREATH *UNUSUAL BRUISING OR BLEEDING *URINARY PROBLEMS (pain or burning when urinating, or frequent urination) *BOWEL PROBLEMS (unusual diarrhea, constipation, pain near the anus) TENDERNESS IN MOUTH AND THROAT WITH OR WITHOUT PRESENCE OF ULCERS (sore throat, sores in mouth, or a toothache) UNUSUAL RASH, SWELLING OR PAIN  UNUSUAL VAGINAL DISCHARGE OR ITCHING   Items with * indicate a potential emergency and should be followed up as soon as possible or go to the Emergency Department if any problems should occur.  Please show the CHEMOTHERAPY ALERT CARD or IMMUNOTHERAPY ALERT  CARD at check-in to the Emergency Department and triage nurse.  Should you have questions after your visit or need to cancel or reschedule your appointment, please contact [facility]  Dept: [phone]  and follow the prompts.  Office hours are 8:00 a.m. to 4:30 p.m. Monday - Friday. Please note that voicemails left after 4:00 p.m. may not be returned until the following business day.  We are closed weekends and major holidays. You have access to a nurse at all times for urgent questions. Please call the main number to the clinic Dept: [phone] and follow the prompts.   For any non-urgent questions, you may also contact your provider using MyChart. We now offer e-Visits for anyone 40 and older to request care online for non-urgent symptoms. For details visit [email or web address].   Also download the MyChart app! Go to the app store, search "MyChart", open the app, select [facility], and log in with your MyChart username and password.  Masks are optional in the cancer centers. If you would like for your care team to wear a mask while they are taking care of you, please let them know. For doctor visits, patients may have with them one support person who is at least 65 years old. At this time, visitors are not allowed in the infusion area.

## 2022-05-21 NOTE — Progress Notes (Signed)
[facility] [phone]   [phone]  OFFICE PROGRESS NOTE  [address], [place] [address]  DIAGNOSIS: Stage IV (T1b, N3, M1C) non-small cell lung cancer, favoring adenocarcinoma presented with right upper lobe lung nodule in addition to right hilar, subcarinal and bilateral mediastinal as well as supraclavicular lymphadenopathy in addition to bone and brain metastasis diagnosed in June 2022.     PD-L1 expression 80%.     Molecular Studies:  Biomarker Findings Microsatellite status - MS-Stable Tumor Mutational Burden - 6 Muts/Mb Genomic Findings For a complete list of the genes assayed, please refer to the Appendix. KRAS G12C, amplification ATM S470* CCND1 amplification - equivocal? HGF amplification - equivocal? MYC amplification - equivocal? FGF19 amplification - equivocal? FGF3 amplification - equivocal? FGF4 amplification - equivocal? NFKBIA amplification NKX2-1 amplification RAD21 amplification - equivocal? [id number]*26 TERT promoter -124C>T TP53 rearrangement exon 9 7 Disease relevant genes with no reportable alterations: ALK, BRAF, EGFR, ERBB2, MET, RET, ROS1   PRIOR THERAPY: SRS to the metastatic brain lesions under the care of Dr. MTammi Klippel  Last treatment on 05/04/2021.   CURRENT THERAPY: Palliative systemic chemotherapy with carboplatin for an AUC 5, Alimta 500 mg/m2 and, Keytruda 200 mg IV every 3 weeks.  First dose expected on 05/08/2021.  Status post 18 cycles.  Starting from cycle #5 he is on maintenance treatment with Alimta and Keytruda every 3 weeks.  INTERVAL HISTORY: Bradley Goodman 66y.o. male returns to the clinic today for follow-up visit.  The patient is feeling fine today with no concerning complaints except for some bruises and ecchymosis of his upper extremities.  He denied having any chest pain, shortness of breath, cough or hemoptysis.  He has no nausea, vomiting,  diarrhea or constipation.  He has no headache or visual changes.  He denied having any recent weight loss or night sweats.  He has no fever or chills.  He continues to tolerate his treatment with maintenance Alimta and Keytruda fairly well.  The patient is here today for evaluation before starting cycle #19.  MEDICAL HISTORY: Past Medical History:  Diagnosis Date   Cervical spondylolysis    Essential hypertension    GERD (gastroesophageal reflux disease)    History of kidney stones    History of migraine    Hyperlipidemia    Hypertension    PONV (postoperative nausea and vomiting)    Type 2 diabetes mellitus (HCC)     ALLERGIES:  has No Known Allergies.  MEDICATIONS:  Current Outpatient Medications  Medication Sig Dispense Refill   cholecalciferol (VITAMIN D) 1000 UNITS tablet Take 1,000 Units by mouth every evening.     Continuous Blood Gluc Sensor (FREESTYLE LIBRE 3 SENSOR) MISC Place 1 sensor on the skin every 14 days. Use to check glucose continuously 2 each 2   dexamethasone (DECADRON) 1 MG tablet Take 2 tablets by mouth daily. 180 tablet 1   docusate sodium (COLACE) 100 MG capsule Take 100 mg by mouth daily.     folic acid (FOLVITE) 1 MG tablet Take 1 tablet by mouth daily. 30 tablet 4   Krill Oil 300 MG CAPS Take 300 mg by mouth every evening.     levothyroxine (SYNTHROID) 50 MCG tablet Take 1 tablet (50 mcg total) by mouth daily before breakfast. 90 tablet 1   loratadine (CLARITIN) 10 MG tablet Take 10 mg by mouth every evening.      losartan (COZAAR) 50 MG tablet  Take 1 and 1/2 tablets (75 mg total) by mouth daily. 135 tablet 0   mirtazapine (REMERON) 15 MG tablet Take 1 tablet by mouth at bedtime. 90 tablet 1   Multiple Vitamins-Minerals (MULTIVITAMIN GUMMIES ADULT PO) Take 2 each by mouth daily.     pantoprazole (PROTONIX) 40 MG tablet Take 1 tablet by mouth every evening. 90 tablet 1   PRESCRIPTION MEDICATION Testosterone injection     prochlorperazine (COMPAZINE) 10 MG  tablet Take 1 tablet (10 mg total) by mouth every 6 (six) hours as needed for nausea or vomiting. 30 tablet 0   rosuvastatin (CRESTOR) 20 MG tablet Take 1 tablet by mouth daily. 90 tablet 1   Semaglutide,0.25 or 0.5MG/DOS, (OZEMPIC, 0.25 OR 0.5 MG/DOSE,) 2 MG/3ML SOPN Inject 0.5 mg into the skin once a week. 9 mL 0   SYRINGE-NEEDLE, DISP, 3 ML 21G X 1-1/2" 3 ML MISC Use to inject testosterone every week 100 each 2   Testosterone 20.25 MG/ACT (1.62%) GEL Apply 1 pump on each shoulder every morning 75 g 0   venlafaxine XR (EFFEXOR-XR) 75 MG 24 hr capsule Take 1 capsule by mouth daily with breakfast. 90 capsule 1   No current facility-administered medications for this visit.    SURGICAL HISTORY:  Past Surgical History:  Procedure Laterality Date   Bilateral inguinal hernia repair     BRONCHIAL NEEDLE ASPIRATION BIOPSY  04/05/2021   Procedure: BRONCHIAL NEEDLE ASPIRATION BIOPSIES;  Surgeon: Garner Nash, DO;  Location: [facility];  Service: Pulmonary;;   COLONOSCOPY  01/23/2012   Procedure: COLONOSCOPY;  Surgeon: Daneil Dolin, MD;  [facility];  Service: Endoscopy;  Laterality: N/A;  9:30 AM   COLONOSCOPY N/A 10/11/2015   Procedure: COLONOSCOPY;  Surgeon: Daneil Dolin, MD;  [facility];  Service: Endoscopy;  Laterality: N/A;  830   COLONOSCOPY N/A 10/14/2019   Procedure: COLONOSCOPY;  Surgeon: Daneil Dolin, MD;  [facility];  Service: Endoscopy;  Laterality: N/A;  1:45   POLYPECTOMY  10/14/2019   Procedure: POLYPECTOMY;  Surgeon: Daneil Dolin, MD;  [facility];  Service: Endoscopy;;  ascending colon, descending colon   VIDEO BRONCHOSCOPY WITH ENDOBRONCHIAL ULTRASOUND N/A 04/05/2021   Procedure: VIDEO BRONCHOSCOPY WITH ENDOBRONCHIAL ULTRASOUND;  Surgeon: Garner Nash, DO;  Location: [facility];  Service: Pulmonary;  Laterality: N/A;    REVIEW OF SYSTEMS:  Constitutional: positive for fatigue Eyes: negative Ears, nose, mouth,  throat, and face: negative Respiratory: negative Cardiovascular: negative Gastrointestinal: negative Genitourinary:negative Integument/breast: negative Hematologic/lymphatic: positive for easy bruising Musculoskeletal:negative Neurological: negative Behavioral/Psych: negative Endocrine: negative Allergic/Immunologic: negative   PHYSICAL EXAMINATION: General appearance: alert, cooperative, appears stated age, and no distress Head: Normocephalic, without obvious abnormality, atraumatic Neck: no adenopathy, no JVD, supple, symmetrical, trachea midline, and thyroid not enlarged, symmetric, no tenderness/mass/nodules Lymph nodes: Cervical, supraclavicular, and axillary nodes normal. Resp: clear to auscultation bilaterally Back: symmetric, no curvature. ROM normal. No CVA tenderness. Cardio: regular rate and rhythm, S1, S2 normal, no murmur, click, rub or gallop GI: soft, non-tender; bowel sounds normal; no masses,  no organomegaly Extremities: extremities normal, atraumatic, no cyanosis or edema Neurologic: Alert and oriented X 3, normal strength and tone. Normal symmetric reflexes. Normal coordination and gait  ECOG PERFORMANCE STATUS: 1 - Symptomatic but completely ambulatory  Blood pressure (!) 148/97, pulse (!) 108, temperature 98.3 F (36.8 C), temperature source Oral, resp. rate 15, height 5' 11.5" (1.816 m), weight 190 lb 0.9 oz (86.2 kg), SpO2  98 %.  LABORATORY DATA: Lab Results  Component Value Date   WBC 11.7 (H) 05/21/2022   HGB 12.5 (L) 05/21/2022   HCT 39.0 05/21/2022   MCV 90.1 05/21/2022   PLT 276 05/21/2022      Chemistry      Component Value Date/Time   NA 140 05/21/2022 0812   NA 144 01/10/2021 0849   K 3.6 05/21/2022 0812   CL 107 05/21/2022 0812   CO2 26 05/21/2022 0812   BUN 18 05/21/2022 0812   BUN 15 01/10/2021 0849   CREATININE 1.10 05/21/2022 0812      Component Value Date/Time   CALCIUM 8.7 (L) 05/21/2022 0812   ALKPHOS 62 05/21/2022 0812    AST 17 05/21/2022 0812   ALT 21 05/21/2022 0812   BILITOT 0.3 05/21/2022 0812       RADIOGRAPHIC STUDIES: CT Chest W Contrast  Result Date: 04/27/2022 CLINICAL DATA:  Primary Cancer Type: Lung Imaging Indication: Assess response to therapy Interval therapy since last imaging? Yes Initial Cancer Diagnosis Date: 04/05/2021; Established by: Biopsy-proven Detailed Pathology: Stage IV non-small cell lung cancer, adenocarcinoma. Primary Tumor location:  Right upper lobe. Brain metastasis. Surgeries: No. Chemotherapy: Yes; Ongoing? Yes; Most recent administration: 04/09/2022 Immunotherapy?  Yes; Type: Keytruda; Ongoing? Yes Radiation therapy? Yes; Date Range: 04/24/2021 - 05/04/2021; Target: Brain * Tracking Code: BO * EXAM: CT CHEST, ABDOMEN, AND PELVIS WITH CONTRAST TECHNIQUE: Multidetector CT imaging of the chest, abdomen and pelvis was performed following the standard protocol during bolus administration of intravenous contrast. RADIATION DOSE REDUCTION: This exam was performed according to the departmental dose-optimization program which includes automated exposure control, adjustment of the mA and/or kV according to patient size and/or use of iterative reconstruction technique. CONTRAST:  131m OMNIPAQUE IOHEXOL 300 MG/ML SOLN additional oral enteric contrast COMPARISON:  Most recent CT chest, abdomen and pelvis 01/30/2022. FINDINGS: CT CHEST FINDINGS Cardiovascular: Aortic atherosclerosis. Normal heart size. Left coronary artery calcifications. No pericardial effusion. Mediastinum/Nodes: No enlarged mediastinal, hilar, or axillary lymph nodes. Thyroid gland, trachea, and esophagus demonstrate no significant findings. Lungs/Pleura: Mild paraseptal emphysema. New, heterogeneous nodular and consolidative airspace disease of the central right middle lobe (series 6, image 89). Multiple small bilateral pulmonary nodules are again seen, and unchanged compared to prior examination, with specific attention to  previously new nodules of the anterior right upper lobe measuring 0.8 cm (series 6, image 46) and of the anterior left upper lobe measuring 0.4 cm (series 6, image 59). Background of very fine centrilobular pulmonary nodules throughout the lungs, most concentrated in the lung apices. New, trace right pleural effusion. Musculoskeletal: No chest wall mass or suspicious osseous lesions identified. CT ABDOMEN PELVIS FINDINGS Hepatobiliary: No solid liver abnormality is seen. Unchanged small low-attenuation lesions scattered throughout the liver, generally subcentimeter and previously characterized as cysts or hemangiomata by prior MR. Hepatic steatosis. No gallstones, gallbladder wall thickening, or biliary dilatation. Pancreas: Unremarkable. No pancreatic ductal dilatation or surrounding inflammatory changes. Spleen: Normal in size without significant abnormality. Adrenals/Urinary Tract: Adrenal glands are unremarkable. Nonobstructive calculus of the superior pole of the left kidney. No right-sided calculi, ureteral calculi, or hydronephrosis. Bladder is unremarkable. Stomach/Bowel: Stomach is within normal limits. Appendix appears normal. No evidence of bowel wall thickening, distention, or inflammatory changes. Vascular/Lymphatic: Aortic atherosclerosis. No enlarged abdominal or pelvic lymph nodes. Reproductive: No mass or other abnormality. Other: Status post bilateral inguinal hernia repair. Trace ascites, new compared to prior examination. Musculoskeletal: No acute osseous findings. IMPRESSION: 1. New, heterogeneous nodular and consolidative  airspace disease of the central right middle lobe, consistent with infection or aspiration. 2. Multiple small bilateral pulmonary nodules are again seen, and unchanged compared to prior examination, with specific attention to previously new nodules of the bilateral upper lobes. Attention on follow-up. 3. Emphysema with a background of very fine centrilobular pulmonary nodules  throughout the lungs, most concentrated in the lung apices, consistent with smoking-related respiratory bronchiolitis. 4. New, trace right pleural effusion. 5. New trace ascites. 6. Coronary artery disease. 7. Hepatic steatosis. 8. Nonobstructive left nephrolithiasis. Aortic Atherosclerosis (ICD10-I70.0) and Emphysema (ICD10-J43.9). Electronically Signed   By: Delanna Ahmadi M.D.   On: 04/27/2022 12:02   CT Abdomen Pelvis W Contrast  Result Date: 04/27/2022 CLINICAL DATA:  Primary Cancer Type: Lung Imaging Indication: Assess response to therapy Interval therapy since last imaging? Yes Initial Cancer Diagnosis Date: 04/05/2021; Established by: Biopsy-proven Detailed Pathology: Stage IV non-small cell lung cancer, adenocarcinoma. Primary Tumor location:  Right upper lobe. Brain metastasis. Surgeries: No. Chemotherapy: Yes; Ongoing? Yes; Most recent administration: 04/09/2022 Immunotherapy?  Yes; Type: Keytruda; Ongoing? Yes Radiation therapy? Yes; Date Range: 04/24/2021 - 05/04/2021; Target: Brain * Tracking Code: BO * EXAM: CT CHEST, ABDOMEN, AND PELVIS WITH CONTRAST TECHNIQUE: Multidetector CT imaging of the chest, abdomen and pelvis was performed following the standard protocol during bolus administration of intravenous contrast. RADIATION DOSE REDUCTION: This exam was performed according to the departmental dose-optimization program which includes automated exposure control, adjustment of the mA and/or kV according to patient size and/or use of iterative reconstruction technique. CONTRAST:  158m OMNIPAQUE IOHEXOL 300 MG/ML SOLN additional oral enteric contrast COMPARISON:  Most recent CT chest, abdomen and pelvis 01/30/2022. FINDINGS: CT CHEST FINDINGS Cardiovascular: Aortic atherosclerosis. Normal heart size. Left coronary artery calcifications. No pericardial effusion. Mediastinum/Nodes: No enlarged mediastinal, hilar, or axillary lymph nodes. Thyroid gland, trachea, and esophagus demonstrate no significant  findings. Lungs/Pleura: Mild paraseptal emphysema. New, heterogeneous nodular and consolidative airspace disease of the central right middle lobe (series 6, image 89). Multiple small bilateral pulmonary nodules are again seen, and unchanged compared to prior examination, with specific attention to previously new nodules of the anterior right upper lobe measuring 0.8 cm (series 6, image 46) and of the anterior left upper lobe measuring 0.4 cm (series 6, image 59). Background of very fine centrilobular pulmonary nodules throughout the lungs, most concentrated in the lung apices. New, trace right pleural effusion. Musculoskeletal: No chest wall mass or suspicious osseous lesions identified. CT ABDOMEN PELVIS FINDINGS Hepatobiliary: No solid liver abnormality is seen. Unchanged small low-attenuation lesions scattered throughout the liver, generally subcentimeter and previously characterized as cysts or hemangiomata by prior MR. Hepatic steatosis. No gallstones, gallbladder wall thickening, or biliary dilatation. Pancreas: Unremarkable. No pancreatic ductal dilatation or surrounding inflammatory changes. Spleen: Normal in size without significant abnormality. Adrenals/Urinary Tract: Adrenal glands are unremarkable. Nonobstructive calculus of the superior pole of the left kidney. No right-sided calculi, ureteral calculi, or hydronephrosis. Bladder is unremarkable. Stomach/Bowel: Stomach is within normal limits. Appendix appears normal. No evidence of bowel wall thickening, distention, or inflammatory changes. Vascular/Lymphatic: Aortic atherosclerosis. No enlarged abdominal or pelvic lymph nodes. Reproductive: No mass or other abnormality. Other: Status post bilateral inguinal hernia repair. Trace ascites, new compared to prior examination. Musculoskeletal: No acute osseous findings. IMPRESSION: 1. New, heterogeneous nodular and consolidative airspace disease of the central right middle lobe, consistent with infection or  aspiration. 2. Multiple small bilateral pulmonary nodules are again seen, and unchanged compared to prior examination, with specific attention to previously new  nodules of the bilateral upper lobes. Attention on follow-up. 3. Emphysema with a background of very fine centrilobular pulmonary nodules throughout the lungs, most concentrated in the lung apices, consistent with smoking-related respiratory bronchiolitis. 4. New, trace right pleural effusion. 5. New trace ascites. 6. Coronary artery disease. 7. Hepatic steatosis. 8. Nonobstructive left nephrolithiasis. Aortic Atherosclerosis (ICD10-I70.0) and Emphysema (ICD10-J43.9). Electronically Signed   By: Delanna Ahmadi M.D.   On: 04/27/2022 12:02    ASSESSMENT AND PLAN: This is a very pleasant 66 years old white male recently diagnosed with stage IV (T1b, N3, M1 C) non-small cell lung cancer favoring adenocarcinoma presented with right upper lobe lung nodule in addition to right hilar, subcarinal and bilateral mediastinal as well as supraclavicular lymphadenopathy.  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  His PD-L1 expression is 80% and his molecular studies showed KRAS G12C mutation. The patient underwent SRS to metastatic brain lesion under the care of Dr. Tammi Klippel and he is currently undergoing systemic chemotherapy with carboplatin for AUC of 5, Alimta 500 Mg/M2 and Keytruda 200 Mg IV every 3 weeks status post 18 cycles.  Starting from cycle #5 the patient will be treated with maintenance treatment with Alimta and Keytruda every 3 weeks. The patient has been tolerating his treatment with maintenance Alimta and Keytruda fairly well. I recommended for him to proceed with cycle #19 today as planned. I will see him back for follow-up visit in 3 weeks for evaluation before the next cycle of his treatment. For the hypertension he was advised to continue with his current blood pressure medications and to monitor it closely at  home. The patient was advised to call immediately if he has any other concerning symptoms in the interval. The patient voices understanding of current disease status and treatment options and is in agreement with the current care plan.  All questions were answered. The patient knows to call the clinic with any problems, questions or concerns. We can certainly see the patient much sooner if necessary. The total time spent in the appointment was 30 minutes.  Disclaimer: This note was dictated with voice recognition software. Similar sounding words can inadvertently be transcribed and may not be corrected upon review.

## 2022-05-23 ENCOUNTER — Other Ambulatory Visit (HOSPITAL_COMMUNITY): Payer: Self-pay

## 2022-06-01 ENCOUNTER — Other Ambulatory Visit: Payer: Self-pay | Admitting: Family Medicine

## 2022-06-01 ENCOUNTER — Other Ambulatory Visit (HOSPITAL_COMMUNITY): Payer: Self-pay

## 2022-06-01 DIAGNOSIS — I10 Essential (primary) hypertension: Secondary | ICD-10-CM

## 2022-06-01 MED ORDER — LOSARTAN POTASSIUM 50 MG PO TABS
75.0000 mg | ORAL_TABLET | Freq: Every day | ORAL | 0 refills | Status: DC
  Filled 2022-06-01: qty 135, 90d supply, fill #0

## 2022-06-04 ENCOUNTER — Other Ambulatory Visit (HOSPITAL_COMMUNITY)
Admission: RE | Admit: 2022-06-04 | Discharge: 2022-06-04 | Disposition: A | Discharge status: Home / Self Care | Payer: 59 | Source: Ambulatory Visit | Attending: "Endocrinology | Admitting: "Endocrinology

## 2022-06-04 DIAGNOSIS — E291 Testicular hypofunction: Secondary | ICD-10-CM | POA: Diagnosis not present

## 2022-06-04 LAB — COMPREHENSIVE METABOLIC PANEL
ALT: 39 U/L (ref 0–44)
AST: 25 U/L (ref 15–41)
Albumin: 3.5 g/dL (ref 3.5–5.0)
Alkaline Phosphatase: 67 U/L (ref 38–126)
Anion gap: 7 (ref 5–15)
BUN: 19 mg/dL (ref 8–23)
CO2: 28 mmol/L (ref 22–32)
Calcium: 9.1 mg/dL (ref 8.9–10.3)
Chloride: 108 mmol/L (ref 98–111)
Creatinine, Ser: 1.06 mg/dL (ref 0.61–1.24)
GFR, Estimated: >60 mL/min (ref >60)
Glucose, Bld: 120 mg/dL — ABNORMAL HIGH (ref 70–99)
Potassium: 3.9 mmol/L (ref 3.5–5.1)
Sodium: 143 mmol/L (ref 135–145)
Total Bilirubin: 0.3 mg/dL (ref 0.3–1.2)
Total Protein: 6.7 g/dL (ref 6.5–8.1)

## 2022-06-04 LAB — T4, FREE: Free T4: 0.76 ng/dL (ref 0.61–1.12)

## 2022-06-04 LAB — TSH: TSH: 1.289 u[IU]/mL (ref 0.350–4.500)

## 2022-06-05 ENCOUNTER — Telehealth: Payer: Self-pay | Admitting: Internal Medicine

## 2022-06-05 NOTE — Telephone Encounter (Signed)
Called patient regarding upcoming appointments, left a voicemail. 

## 2022-06-06 ENCOUNTER — Ambulatory Visit: Payer: 59 | Admitting: "Endocrinology

## 2022-06-12 ENCOUNTER — Inpatient Hospital Stay: Payer: 59

## 2022-06-12 ENCOUNTER — Other Ambulatory Visit (HOSPITAL_COMMUNITY): Payer: Self-pay

## 2022-06-12 ENCOUNTER — Other Ambulatory Visit: Payer: Self-pay

## 2022-06-12 ENCOUNTER — Inpatient Hospital Stay: Payer: 59 | Attending: Physician Assistant | Admitting: Internal Medicine

## 2022-06-12 VITALS — BP 145/88 | HR 109 | Temp 98.6°F | Resp 16 | Wt 190.9 lb

## 2022-06-12 VITALS — BP 154/103 | HR 74 | Temp 98.4°F | Resp 16

## 2022-06-12 DIAGNOSIS — C3491 Malignant neoplasm of unspecified part of right bronchus or lung: Secondary | ICD-10-CM

## 2022-06-12 DIAGNOSIS — C7931 Secondary malignant neoplasm of brain: Secondary | ICD-10-CM | POA: Insufficient documentation

## 2022-06-12 DIAGNOSIS — Z5111 Encounter for antineoplastic chemotherapy: Secondary | ICD-10-CM | POA: Insufficient documentation

## 2022-06-12 DIAGNOSIS — E538 Deficiency of other specified B group vitamins: Secondary | ICD-10-CM | POA: Insufficient documentation

## 2022-06-12 DIAGNOSIS — Z79899 Other long term (current) drug therapy: Secondary | ICD-10-CM | POA: Diagnosis not present

## 2022-06-12 DIAGNOSIS — Z5112 Encounter for antineoplastic immunotherapy: Secondary | ICD-10-CM | POA: Insufficient documentation

## 2022-06-12 DIAGNOSIS — Z923 Personal history of irradiation: Secondary | ICD-10-CM | POA: Insufficient documentation

## 2022-06-12 DIAGNOSIS — I10 Essential (primary) hypertension: Secondary | ICD-10-CM | POA: Diagnosis not present

## 2022-06-12 DIAGNOSIS — F17210 Nicotine dependence, cigarettes, uncomplicated: Secondary | ICD-10-CM | POA: Diagnosis not present

## 2022-06-12 DIAGNOSIS — C7951 Secondary malignant neoplasm of bone: Secondary | ICD-10-CM | POA: Diagnosis not present

## 2022-06-12 DIAGNOSIS — C3411 Malignant neoplasm of upper lobe, right bronchus or lung: Secondary | ICD-10-CM | POA: Insufficient documentation

## 2022-06-12 LAB — CBC WITH DIFFERENTIAL (CANCER CENTER ONLY)
Abs Immature Granulocytes: 0.16 10*3/uL — ABNORMAL HIGH (ref 0.00–0.07)
Basophils Absolute: 0.1 10*3/uL (ref 0.0–0.1)
Basophils Relative: 1 %
Eosinophils Absolute: 0.1 10*3/uL (ref 0.0–0.5)
Eosinophils Relative: 0 %
HCT: 38.9 % — ABNORMAL LOW (ref 39.0–52.0)
Hemoglobin: 12.5 g/dL — ABNORMAL LOW (ref 13.0–17.0)
Immature Granulocytes: 1 %
Lymphocytes Relative: 9 %
Lymphs Abs: 1.2 10*3/uL (ref 0.7–4.0)
MCH: 28.9 pg (ref 26.0–34.0)
MCHC: 32.1 g/dL (ref 30.0–36.0)
MCV: 89.8 fL (ref 80.0–100.0)
Monocytes Absolute: 0.7 10*3/uL (ref 0.1–1.0)
Monocytes Relative: 6 %
Neutro Abs: 10.6 10*3/uL — ABNORMAL HIGH (ref 1.7–7.7)
Neutrophils Relative %: 83 %
Platelet Count: 277 10*3/uL (ref 150–400)
RBC: 4.33 MIL/uL (ref 4.22–5.81)
RDW: 18 % — ABNORMAL HIGH (ref 11.5–15.5)
WBC Count: 12.8 10*3/uL — ABNORMAL HIGH (ref 4.0–10.5)
nRBC: 0 % (ref 0.0–0.2)

## 2022-06-12 LAB — TSH: TSH: 0.858 u[IU]/mL (ref 0.350–4.500)

## 2022-06-12 LAB — CMP (CANCER CENTER ONLY)
ALT: 25 U/L (ref 0–44)
AST: 16 U/L (ref 15–41)
Albumin: 3.9 g/dL (ref 3.5–5.0)
Alkaline Phosphatase: 68 U/L (ref 38–126)
Anion gap: 9 (ref 5–15)
BUN: 14 mg/dL (ref 8–23)
CO2: 27 mmol/L (ref 22–32)
Calcium: 9.3 mg/dL (ref 8.9–10.3)
Chloride: 105 mmol/L (ref 98–111)
Creatinine: 0.96 mg/dL (ref 0.61–1.24)
GFR, Estimated: >60 mL/min (ref >60)
Glucose, Bld: 180 mg/dL — ABNORMAL HIGH (ref 70–99)
Potassium: 3.8 mmol/L (ref 3.5–5.1)
Sodium: 141 mmol/L (ref 135–145)
Total Bilirubin: 0.2 mg/dL — ABNORMAL LOW (ref 0.3–1.2)
Total Protein: 6.4 g/dL — ABNORMAL LOW (ref 6.5–8.1)

## 2022-06-12 LAB — TESTOSTERONE,FREE AND TOTAL
Testosterone, Free: <0.2 pg/mL — ABNORMAL LOW (ref 6.6–18.1)
Testosterone: 22 ng/dL — ABNORMAL LOW (ref 264–916)

## 2022-06-12 MED ORDER — SODIUM CHLORIDE 0.9 % IV SOLN: Freq: Once | INTRAVENOUS | Status: AC

## 2022-06-12 MED ORDER — SODIUM CHLORIDE 0.9 % IV SOLN
200.0000 mg | Freq: Once | INTRAVENOUS | Status: AC
  Administered 2022-06-12: 200 mg via INTRAVENOUS
  Filled 2022-06-12: qty 200

## 2022-06-12 MED ORDER — SODIUM CHLORIDE 0.9 % IV SOLN
500.0000 mg/m2 | Freq: Once | INTRAVENOUS | Status: AC
  Administered 2022-06-12: 1000 mg via INTRAVENOUS
  Filled 2022-06-12: qty 40

## 2022-06-12 MED ORDER — PROCHLORPERAZINE MALEATE 10 MG PO TABS
10.0000 mg | ORAL_TABLET | Freq: Once | ORAL | Status: AC
  Administered 2022-06-12: 10 mg via ORAL
  Filled 2022-06-12: qty 1

## 2022-06-12 NOTE — Progress Notes (Signed)
Ok to treat today with R of 102-109 per Dr. Julien Nordmann.

## 2022-06-12 NOTE — Patient Instructions (Signed)
[facility]   Discharge Instructions: Thank you for choosing [facility] to provide your oncology and hematology care.   If you have a lab appointment with the [facility], please go directly to the [facility] and check in at the registration area.   Wear comfortable clothing and clothing appropriate for easy access to any Portacath or PICC line.   We strive to give you quality time with your provider. You may need to reschedule your appointment if you arrive late (15 or more minutes).  Arriving late affects you and other patients whose appointments are after yours.  Also, if you miss three or more appointments without notifying the office, you may be dismissed from the clinic at the provider's discretion.      For prescription refill requests, have your pharmacy contact our office and allow 72 hours for refills to be completed.    Today you received the following chemotherapy and/or immunotherapy agents: pembrolizumab and pemetrexed      To help prevent nausea and vomiting after your treatment, we encourage you to take your nausea medication as directed.  BELOW ARE SYMPTOMS THAT SHOULD BE REPORTED IMMEDIATELY: *FEVER GREATER THAN 100.4 F (38 C) OR HIGHER *CHILLS OR SWEATING *NAUSEA AND VOMITING THAT IS NOT CONTROLLED WITH YOUR NAUSEA MEDICATION *UNUSUAL SHORTNESS OF BREATH *UNUSUAL BRUISING OR BLEEDING *URINARY PROBLEMS (pain or burning when urinating, or frequent urination) *BOWEL PROBLEMS (unusual diarrhea, constipation, pain near the anus) TENDERNESS IN MOUTH AND THROAT WITH OR WITHOUT PRESENCE OF ULCERS (sore throat, sores in mouth, or a toothache) UNUSUAL RASH, SWELLING OR PAIN  UNUSUAL VAGINAL DISCHARGE OR ITCHING   Items with * indicate a potential emergency and should be followed up as soon as possible or go to the Emergency Department if any problems should occur.  Please show the CHEMOTHERAPY ALERT CARD or IMMUNOTHERAPY ALERT  CARD at check-in to the Emergency Department and triage nurse.  Should you have questions after your visit or need to cancel or reschedule your appointment, please contact [facility]  Dept: [phone]  and follow the prompts.  Office hours are 8:00 a.m. to 4:30 p.m. Monday - Friday. Please note that voicemails left after 4:00 p.m. may not be returned until the following business day.  We are closed weekends and major holidays. You have access to a nurse at all times for urgent questions. Please call the main number to the clinic Dept: [phone] and follow the prompts.   For any non-urgent questions, you may also contact your provider using MyChart. We now offer e-Visits for anyone 65 and older to request care online for non-urgent symptoms. For details visit [email or web address].   Also download the MyChart app! Go to the app store, search "MyChart", open the app, select [facility], and log in with your MyChart username and password.  Masks are optional in the cancer centers. If you would like for your care team to wear a mask while they are taking care of you, please let them know. For doctor visits, patients may have with them one support person who is at least 65 years old. At this time, visitors are not allowed in the infusion area.

## 2022-06-12 NOTE — Progress Notes (Signed)
[facility] [phone]   [phone]  OFFICE PROGRESS NOTE  Bradley Goodman, [place] [address]  DIAGNOSIS: Stage IV (T1b, N3, M1C) non-small cell lung cancer, favoring adenocarcinoma presented with right upper lobe lung nodule in addition to right hilar, subcarinal and bilateral mediastinal as well as supraclavicular lymphadenopathy in addition to bone and brain metastasis diagnosed in June 2022.     PD-L1 expression 80%.     Molecular Studies:  Biomarker Findings Microsatellite status - MS-Stable Tumor Mutational Burden - 6 Muts/Mb Genomic Findings For a complete list of the genes assayed, please refer to the Appendix. KRAS G12C, amplification ATM S470* CCND1 amplification - equivocal? HGF amplification - equivocal? MYC amplification - equivocal? FGF19 amplification - equivocal? FGF3 amplification - equivocal? FGF4 amplification - equivocal? NFKBIA amplification NKX2-1 amplification RAD21 amplification - equivocal? [id number]*26 TERT promoter -124C>T TP53 rearrangement exon 9 7 Disease relevant genes with no reportable alterations: ALK, BRAF, EGFR, ERBB2, MET, RET, ROS1   PRIOR THERAPY: SRS to the metastatic brain lesions under the care of Dr. MTammi Klippel  Last treatment on 05/04/2021.   CURRENT THERAPY: Palliative systemic chemotherapy with carboplatin for an AUC 5, Alimta 500 mg/m2 and, Keytruda 200 mg IV every 3 weeks.  First dose expected on 05/08/2021.  Status post 19 cycles.  Starting from cycle #5 he is on maintenance treatment with Alimta and Keytruda every 3 weeks.  INTERVAL HISTORY: Bradley Goodman 66y.o. male returns to the clinic today for follow-up visit accompanied by his wife.  The patient is feeling fine today with no concerning complaints.  He will celebrate his 66th birthday tomorrow.  He denied having any chest pain, shortness of breath except with exertion with no cough or hemoptysis.   He has no nausea, vomiting, diarrhea or constipation.  He has no headache or visual changes.  He is here today for evaluation before starting cycle #20 of his treatment.  MEDICAL HISTORY: Past Medical History:  Diagnosis Date   Cervical spondylolysis    Essential hypertension    GERD (gastroesophageal reflux disease)    History of kidney stones    History of migraine    Hyperlipidemia    Hypertension    PONV (postoperative nausea and vomiting)    Type 2 diabetes mellitus (HCC)     ALLERGIES:  has No Known Allergies.  MEDICATIONS:  Current Outpatient Medications  Medication Sig Dispense Refill   cholecalciferol (VITAMIN D) 1000 UNITS tablet Take 1,000 Units by mouth every evening.     Continuous Blood Gluc Sensor (FREESTYLE LIBRE 3 SENSOR) MISC Place 1 sensor on the skin every 14 days. Use to check glucose continuously 2 each 2   dexamethasone (DECADRON) 1 MG tablet Take 2 tablets by mouth daily. 180 tablet 1   docusate sodium (COLACE) 100 MG capsule Take 100 mg by mouth daily.     folic acid (FOLVITE) 1 MG tablet Take 1 tablet by mouth daily. 30 tablet 4   Krill Oil 300 MG CAPS Take 300 mg by mouth every evening.     levothyroxine (SYNTHROID) 50 MCG tablet Take 1 tablet (50 mcg total) by mouth daily before breakfast. 90 tablet 1   loratadine (CLARITIN) 10 MG tablet Take 10 mg by mouth every evening.      losartan (COZAAR) 50 MG tablet Take 1 and 1/2 tablets (75 mg total) by mouth daily. 135 tablet 0   mirtazapine (REMERON) 15 MG tablet Take 1  tablet by mouth at bedtime. 90 tablet 1   Multiple Vitamins-Minerals (MULTIVITAMIN GUMMIES ADULT PO) Take 2 each by mouth daily.     pantoprazole (PROTONIX) 40 MG tablet Take 1 tablet by mouth every evening. 90 tablet 1   PRESCRIPTION MEDICATION Testosterone injection     prochlorperazine (COMPAZINE) 10 MG tablet Take 1 tablet (10 mg total) by mouth every 6 (six) hours as needed for nausea or vomiting. 30 tablet 0   rosuvastatin (CRESTOR) 20  MG tablet Take 1 tablet by mouth daily. 90 tablet 1   Semaglutide,0.25 or 0.5MG/DOS, (OZEMPIC, 0.25 OR 0.5 MG/DOSE,) 2 MG/3ML SOPN Inject 0.5 mg into the skin once a week. 9 mL 0   SYRINGE-NEEDLE, DISP, 3 ML 21G X 1-1/2" 3 ML MISC Use to inject testosterone every week 100 each 2   Testosterone 20.25 MG/ACT (1.62%) GEL Apply 1 pump on each shoulder every morning 75 g 0   venlafaxine XR (EFFEXOR-XR) 75 MG 24 hr capsule Take 1 capsule by mouth daily with breakfast. 90 capsule 1   No current facility-administered medications for this visit.    SURGICAL HISTORY:  Past Surgical History:  Procedure Laterality Date   Bilateral inguinal hernia repair     BRONCHIAL NEEDLE ASPIRATION BIOPSY  04/05/2021   Procedure: BRONCHIAL NEEDLE ASPIRATION BIOPSIES;  Surgeon: Garner Nash, DO;  Location: [facility];  Service: Pulmonary;;   COLONOSCOPY  01/23/2012   Procedure: COLONOSCOPY;  Surgeon: Daneil Dolin, MD;  [facility];  Service: Endoscopy;  Laterality: N/A;  9:30 AM   COLONOSCOPY N/A 10/11/2015   Procedure: COLONOSCOPY;  Surgeon: Daneil Dolin, MD;  [facility];  Service: Endoscopy;  Laterality: N/A;  830   COLONOSCOPY N/A 10/14/2019   Procedure: COLONOSCOPY;  Surgeon: Daneil Dolin, MD;  [facility];  Service: Endoscopy;  Laterality: N/A;  1:45   POLYPECTOMY  10/14/2019   Procedure: POLYPECTOMY;  Surgeon: Daneil Dolin, MD;  [facility];  Service: Endoscopy;;  ascending colon, descending colon   VIDEO BRONCHOSCOPY WITH ENDOBRONCHIAL ULTRASOUND N/A 04/05/2021   Procedure: VIDEO BRONCHOSCOPY WITH ENDOBRONCHIAL ULTRASOUND;  Surgeon: Garner Nash, DO;  Location: [facility];  Service: Pulmonary;  Laterality: N/A;    REVIEW OF SYSTEMS:  A comprehensive review of systems was negative.   PHYSICAL EXAMINATION: General appearance: alert, cooperative, appears stated age, and no distress Head: Normocephalic, without obvious abnormality, atraumatic Neck:  no adenopathy, no JVD, supple, symmetrical, trachea midline, and thyroid not enlarged, symmetric, no tenderness/mass/nodules Lymph nodes: Cervical, supraclavicular, and axillary nodes normal. Resp: clear to auscultation bilaterally Back: symmetric, no curvature. ROM normal. No CVA tenderness. Cardio: regular rate and rhythm, S1, S2 normal, no murmur, click, rub or gallop GI: soft, non-tender; bowel sounds normal; no masses,  no organomegaly Extremities: extremities normal, atraumatic, no cyanosis or edema  ECOG PERFORMANCE STATUS: 1 - Symptomatic but completely ambulatory  Blood pressure (!) 145/88, pulse (!) 109, temperature 98.6 F (37 C), temperature source Oral, resp. rate 16, weight 190 lb 14.4 oz (86.6 kg), SpO2 98 %.  LABORATORY DATA: Lab Results  Component Value Date   WBC 12.8 (H) 06/12/2022   HGB 12.5 (L) 06/12/2022   HCT 38.9 (L) 06/12/2022   MCV 89.8 06/12/2022   PLT 277 06/12/2022      Chemistry      Component Value Date/Time   NA 143 06/04/2022 0816   NA 144 01/10/2021 0849   K 3.9 06/04/2022 0816   CL  108 06/04/2022 0816   CO2 28 06/04/2022 0816   BUN 19 06/04/2022 0816   BUN 15 01/10/2021 0849   CREATININE 1.06 06/04/2022 0816   CREATININE 1.10 05/21/2022 0812      Component Value Date/Time   CALCIUM 9.1 06/04/2022 0816   ALKPHOS 67 06/04/2022 0816   AST 25 06/04/2022 0816   AST 17 05/21/2022 0812   ALT 39 06/04/2022 0816   ALT 21 05/21/2022 0812   BILITOT 0.3 06/04/2022 0816   BILITOT 0.3 05/21/2022 0812       RADIOGRAPHIC STUDIES: No results found.  ASSESSMENT AND PLAN: This is a very pleasant 66 years old white male recently diagnosed with stage IV (T1b, N3, M1 C) non-small cell lung cancer favoring adenocarcinoma presented with right upper lobe lung nodule in addition to right hilar, subcarinal and bilateral mediastinal as well as supraclavicular lymphadenopathy.  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  His PD-L1  expression is 80% and his molecular studies showed KRAS G12C mutation. The patient underwent SRS to metastatic brain lesion under the care of Dr. Tammi Klippel and he is currently undergoing systemic chemotherapy with carboplatin for AUC of 5, Alimta 500 Mg/M2 and Keytruda 200 Mg IV every 3 weeks status post 19 cycles.  Starting from cycle #5 the patient will be treated with maintenance treatment with Alimta and Keytruda every 3 weeks. The patient has been tolerating this treatment well with no concerning adverse effects. I recommended for him to proceed with cycle #20 today as planned. He is scheduled to have repeat MRI of the brain by Dr. Mickeal Skinner soon. I will see him back for follow-up visit in 3 weeks for evaluation before starting the next cycle of his treatment. He was advised to call immediately if he has any other concerning symptoms in the interval. The patient voices understanding of current disease status and treatment options and is in agreement with the current care plan.  All questions were answered. The patient knows to call the clinic with any problems, questions or concerns. We can certainly see the patient much sooner if necessary. The total time spent in the appointment was 15 minutes.  Disclaimer: This note was dictated with voice recognition software. Similar sounding words can inadvertently be transcribed and may not be corrected upon review.

## 2022-06-14 ENCOUNTER — Other Ambulatory Visit (HOSPITAL_COMMUNITY): Payer: Self-pay

## 2022-06-14 ENCOUNTER — Ambulatory Visit: Payer: 59 | Admitting: "Endocrinology

## 2022-06-14 ENCOUNTER — Encounter: Payer: Self-pay | Admitting: Internal Medicine

## 2022-06-14 ENCOUNTER — Encounter: Payer: Self-pay | Admitting: "Endocrinology

## 2022-06-14 VITALS — BP 126/94 | HR 104 | Ht 71.5 in | Wt 191.8 lb

## 2022-06-14 DIAGNOSIS — E291 Testicular hypofunction: Secondary | ICD-10-CM

## 2022-06-14 DIAGNOSIS — E2740 Unspecified adrenocortical insufficiency: Secondary | ICD-10-CM | POA: Diagnosis not present

## 2022-06-14 DIAGNOSIS — E230 Hypopituitarism: Secondary | ICD-10-CM | POA: Diagnosis not present

## 2022-06-14 DIAGNOSIS — E039 Hypothyroidism, unspecified: Secondary | ICD-10-CM

## 2022-06-14 DIAGNOSIS — F17200 Nicotine dependence, unspecified, uncomplicated: Secondary | ICD-10-CM

## 2022-06-14 MED ORDER — TESTOSTERONE CYPIONATE 100 MG/ML IM SOLN
50.0000 mg | INTRAMUSCULAR | 0 refills | Status: DC
  Filled 2022-06-14: qty 10, 28d supply, fill #0

## 2022-06-14 NOTE — Progress Notes (Signed)
Endocrinology follow-up note                                             06/14/2022, 1:28 PM   Subjective:    Patient ID: Bradley Goodman, male    DOB: [birth year], PCP Bradley Brooklyn, FNP   Past Medical History:  Diagnosis Date   Cervical spondylolysis    Essential hypertension    GERD (gastroesophageal reflux disease)    History of kidney stones    History of migraine    Hyperlipidemia    Hypertension    PONV (postoperative nausea and vomiting)    Type 2 diabetes mellitus ([place])    Past Surgical History:  Procedure Laterality Date   Bilateral inguinal hernia repair     BRONCHIAL NEEDLE ASPIRATION BIOPSY  04/05/2021   Procedure: BRONCHIAL NEEDLE ASPIRATION BIOPSIES;  Surgeon: Bradley Nash, DO;  Location: [facility];  Service: Pulmonary;;   COLONOSCOPY  01/23/2012   Procedure: COLONOSCOPY;  Surgeon: Bradley Dolin, MD;  [facility];  Service: Endoscopy;  Laterality: N/A;  9:30 AM   COLONOSCOPY N/A 10/11/2015   Procedure: COLONOSCOPY;  Surgeon: Bradley Dolin, MD;  [facility];  Service: Endoscopy;  Laterality: N/A;  830   COLONOSCOPY N/A 10/14/2019   Procedure: COLONOSCOPY;  Surgeon: Bradley Dolin, MD;  [facility];  Service: Endoscopy;  Laterality: N/A;  1:45   POLYPECTOMY  10/14/2019   Procedure: POLYPECTOMY;  Surgeon: Bradley Dolin, MD;  [facility];  Service: Endoscopy;;  ascending colon, descending colon   VIDEO BRONCHOSCOPY WITH ENDOBRONCHIAL ULTRASOUND N/A 04/05/2021   Procedure: VIDEO BRONCHOSCOPY WITH ENDOBRONCHIAL ULTRASOUND;  Surgeon: Bradley Nash, DO;  Location: [facility];  Service: Pulmonary;  Laterality: N/A;   Social History   Socioeconomic History   Marital status: Married    Spouse name: Not on file   Number of children: Not on file   Years of education: Not on file   Highest education level: Not on file  Occupational History   Not on file  Tobacco Use   Smoking status: Every Day     Packs/day: 0.50    Years: 30.00    Total pack years: 15.00    Types: Cigarettes    Start date: 10/30/1973   Smokeless tobacco: Never  Vaping Use   Vaping Use: Never used  Substance and Sexual Activity   Alcohol use: Yes    Alcohol/week: 0.0 standard drinks of alcohol    Comment: One drink every 6 months.   Drug use: No   Sexual activity: Yes  Other Topics Concern   Not on file  Social History Narrative   Not on file   Social Determinants of Health   Financial Resource Strain: Not on file  Food Insecurity: Not on file  Transportation Needs: Not on file  Physical Activity: Not on file  Stress: Not on file  Social Connections: Not on file   Family History  Problem Relation Age of Onset   Hypertension Mother    Diabetes Mother    Heart attack Mother    Hypertension Father    Heart attack Father    Heart attack Brother    Colon cancer Neg Hx    Outpatient Encounter Medications as of 06/14/2022  Medication Sig   testosterone cypionate (  DEPOTESTOTERONE CYPIONATE) 100 MG/ML injection Inject 0.5 mLs (50 mg total) into the muscle once a week. For IM use only.   cholecalciferol (VITAMIN D) 1000 UNITS tablet Take 1,000 Units by mouth every evening.   Continuous Blood Gluc Sensor (FREESTYLE LIBRE 3 SENSOR) MISC Place 1 sensor on the skin every 14 days. Use to check glucose continuously   dexamethasone (DECADRON) 1 MG tablet Take 2 tablets by mouth daily.   docusate sodium (COLACE) 100 MG capsule Take 100 mg by mouth daily.   folic acid (FOLVITE) 1 MG tablet Take 1 tablet by mouth daily.   Krill Oil 300 MG CAPS Take 300 mg by mouth every evening.   levothyroxine (SYNTHROID) 50 MCG tablet Take 1 tablet (50 mcg total) by mouth daily before breakfast.   loratadine (CLARITIN) 10 MG tablet Take 10 mg by mouth every evening.    losartan (COZAAR) 50 MG tablet Take 1 and 1/2 tablets (75 mg total) by mouth daily.   mirtazapine (REMERON) 15 MG tablet Take 1 tablet by mouth at bedtime.    Multiple Vitamins-Minerals (MULTIVITAMIN GUMMIES ADULT PO) Take 2 each by mouth daily.   pantoprazole (PROTONIX) 40 MG tablet Take 1 tablet by mouth every evening.   PRESCRIPTION MEDICATION Testosterone injection   prochlorperazine (COMPAZINE) 10 MG tablet Take 1 tablet (10 mg total) by mouth every 6 (six) hours as needed for nausea or vomiting.   rosuvastatin (CRESTOR) 20 MG tablet Take 1 tablet by mouth daily.   Semaglutide,0.25 or 0.5MG /DOS, (OZEMPIC, 0.25 OR 0.5 MG/DOSE,) 2 MG/3ML SOPN Inject 0.5 mg into the skin once a week.   SYRINGE-NEEDLE, DISP, 3 ML 21G X 1-1/2" 3 ML MISC Use to inject testosterone every week   venlafaxine XR (EFFEXOR-XR) 75 MG 24 hr capsule Take 1 capsule by mouth daily with breakfast.   [DISCONTINUED] Testosterone 20.25 MG/ACT (1.62%) GEL Apply 1 pump on each shoulder every morning   No facility-administered encounter medications on file as of 06/14/2022.   ALLERGIES: No Known Allergies  VACCINATION STATUS: Immunization History  Administered Date(s) Administered   Hepatitis B 02/06/1990, 10/28/1990, 04/02/1991   Influenza,inj,Quad PF,6+ Mos 07/07/2019, 07/31/2021   Influenza-Unspecified 07/25/2018   Moderna Sars-Covid-2 Vaccination 12/01/2019, 12/30/2019, 10/19/2020   Pneumococcal Conjugate-13 03/11/2017   Tdap 01/17/2016    HPI Bradley Goodman is 66 y.o. male who presents today for follow-up after he was seen in consultation for multiple medical problems as follows.  He notes from previous visits. History is obtained directly from the patient as well as chart review. He is accompanied by his wife.  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  He is status post radiosurgery of CNS metastasis and chemotherapy.  His chemotherapy in the involved high-dose steroids in the form of dexamethasone, currently on dexamethasone 2 mg p.o. daily.   During this process, he developed fatigue, low libido, and loss of  skeletal muscle mass.  His labs showed hypogonadism, partial adrenal insufficiency, and hypothyroidism.  For this endocrine deficits, he was started on testosterone injections to which he has responded until last visit.  However, testosterone gel prescription was not covered by his insurance.  He has not taken any testosterone in the last several weeks and he presents with very low total testosterone.   His previsit thyroid function tests are consistent with appropriate replacement.   He denies prior testicular injury.  He denies testicular radiation . He wishes to be continued on testosterone replacement therapy as well  as his other hormones.  Continues to gain weight progressively.  He continues to smoke. He is back to enjoy his outdoor activities including golfing.    Review of Systems  Constitutional: + Stable weight,   + fatigue, + subjective hypothermia, + low libido  Objective:       06/14/2022    9:09 AM 06/12/2022   12:36 PM 06/12/2022   10:59 AM  Vitals with BMI  Height 5' 11.5"    Weight 191 lbs 13 oz    BMI 56.81    Systolic 275 170   Diastolic 94 017   Pulse 494 74 102    BP (!) 126/94   Pulse (!) 104   Ht 5' 11.5" (1.816 m)   Wt 191 lb 12.8 oz (87 kg)   BMI 26.38 kg/m   Wt Readings from Last 3 Encounters:  06/14/22 191 lb 12.8 oz (87 kg)  06/12/22 190 lb 14.4 oz (86.6 kg)  05/21/22 190 lb 0.9 oz (86.2 kg)    Physical Exam  Constitutional:  Body mass index is 26.38 kg/m.,  not in acute distress, normal state of mind Eyes: PERRLA, EOMI, no exophthalmos ENT: moist mucous membranes, no gross thyromegaly, no gross cervical lymphadenopathy   CMP ( most recent) CMP     Component Value Date/Time   NA 141 06/12/2022 1001   NA 144 01/10/2021 0849   K 3.8 06/12/2022 1001   CL 105 06/12/2022 1001   CO2 27 06/12/2022 1001   GLUCOSE 180 (H) 06/12/2022 1001   BUN 14 06/12/2022 1001   BUN 15 01/10/2021 0849   CREATININE 0.96 06/12/2022 1001   CALCIUM 9.3  06/12/2022 1001   PROT 6.4 (L) 06/12/2022 1001   PROT 6.3 01/10/2021 0849   ALBUMIN 3.9 06/12/2022 1001   ALBUMIN 4.2 01/10/2021 0849   AST 16 06/12/2022 1001   ALT 25 06/12/2022 1001   ALKPHOS 68 06/12/2022 1001   BILITOT 0.2 (L) 06/12/2022 1001   GFRNONAA >60 06/12/2022 1001   GFRAA 82 07/14/2020 0945     Diabetic Labs (most recent): Lab Results  Component Value Date   HGBA1C 6.6 (H) 04/17/2022   HGBA1C 7.5 (H) 01/10/2022   HGBA1C 9.7 (H) 10/19/2021   MICROALBUR neg 07/13/2014     Lipid Panel ( most recent) Lipid Panel     Component Value Date/Time   CHOL 104 01/10/2022 0800   TRIG 241 (H) 01/10/2022 0800   TRIG 87 10/18/2016 0851   HDL 41 01/10/2022 0800   HDL 37 (L) 10/18/2016 0851   CHOLHDL 2.5 01/10/2022 0800   LDLCALC 26 01/10/2022 0800   LDLCALC 53 07/13/2014 0925   LABVLDL 37 01/10/2022 0800      Lab Results  Component Value Date   TSH 0.858 06/12/2022   TSH 1.289 06/04/2022   TSH 0.969 05/21/2022   TSH 1.014 04/30/2022   TSH 0.597 04/09/2022   TSH 0.593 03/19/2022   TSH 0.642 02/26/2022   TSH 1.245 02/05/2022   TSH 1.858 01/22/2022   TSH 1.241 01/15/2022   FREET4 0.76 06/04/2022   FREET4 0.58 (L) 01/22/2022   FREET4 0.75 09/22/2021      Latest Reference Range & Units 01/22/22 08:18 01/22/22 08:20  Sex Horm Binding Glob, Serum 19.3 - 76.4 nmol/L  16.7 (L)  Testosterone 264 - 916 ng/dL  234 (L)  Testosterone Free 6.6 - 18.1 pg/mL  4.7 (L)  Testosterone-% Free 0.2 - 0.7 %  1.9 (H)  TSH 0.350 - 4.500 uIU/mL 1.858  Triiodothyronine,Free,Serum 2.0 - 4.4 pg/mL 2.0   T4,Free(Direct) 0.61 - 1.12 ng/dL 0.58 (L)   (L): Data is abnormally low (H): Data is abnormally high  Assessment & Plan:   1. Hypopituitarism  2.  Hypogonadism 3.hypothyroidism 4.partial adrenal insufficiency -He is accompanied by his wife to clinic.  I discussed his recent findings and previous lab work with him.  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 -He would benefit from continued supplement with Decadron.  He is advised to continue Decadron 2 mg p.o. daily at breakfast.   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  His previsit thyroid function tests are consistent with adequate replacement.  He is advised to continue levothyroxine 50 mcg p.o. daily before breakfast.     - We discussed about the correct intake of his thyroid hormone, on empty stomach at fasting, with water, separated by at least 30 minutes from breakfast and other medications,  and separated by more than 4 hours from calcium, iron, multivitamins, acid reflux medications (PPIs). -Patient is made aware of the fact that thyroid hormone replacement is needed for life, dose to be adjusted by periodic monitoring of thyroid function tests.  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  His insurance did not provide coverage for testosterone gel.  I discussed and initiated testosterone cypionate IM injection 50 mg every week with plan to repeat labs in 3 months.     -Treatment target for him will be between 250-350 ng per DL, with the aim being to help him with libido as well as possibly avoid further loss of skeletal muscle mass.  Regarding his type 2 diabetes, his recent A1c was 6.6%.   He is advised to continue Ozempic 0.25 mg subcutaneously weekly.  He was taken off of metformin recently.   The patient was counseled on the dangers of tobacco use, and was advised to quit.  Reviewed strategies to maximize success, including removing cigarettes and smoking materials from environment.   - he is advised to maintain close follow up with his oncologist, neurologist, radiologist and PCP Bradley Brooklyn, FNP for primary care needs.   I spent 32 minutes in the care of the patient today  including review of labs from Thyroid Function, CMP, and other relevant labs ; imaging/biopsy records (current and previous including abstractions from other facilities); face-to-face time discussing  his lab results and symptoms, medications doses, his options of short and long term treatment based on the latest standards of care / guidelines;   and documenting the encounter.  Ernesta Amble Voorhees  participated in the discussions, expressed understanding, and voiced agreement with the above plans.  All questions were answered to his satisfaction. he is encouraged to contact clinic should he have any questions or concerns prior to his return visit.   Follow up plan: Return in about 3 months (around 09/13/2022) for A1c -NV, Fasting Labs  in AM B4 8.   Glade Lloyd, MD [facility] [address] [place], [place] [address] Phone: [phone]  Fax: [phone]     06/14/2022, 1:28 PM  This note was partially dictated with voice recognition software. Similar sounding words can be transcribed inadequately or may not  be corrected upon review.

## 2022-06-15 ENCOUNTER — Ambulatory Visit
Admission: RE | Admit: 2022-06-15 | Discharge: 2022-06-15 | Disposition: A | Discharge status: Home / Self Care | Payer: 59 | Source: Ambulatory Visit | Attending: Internal Medicine | Admitting: Internal Medicine

## 2022-06-15 ENCOUNTER — Other Ambulatory Visit (HOSPITAL_COMMUNITY): Payer: Self-pay

## 2022-06-15 DIAGNOSIS — G939 Disorder of brain, unspecified: Secondary | ICD-10-CM | POA: Diagnosis not present

## 2022-06-15 DIAGNOSIS — I619 Nontraumatic intracerebral hemorrhage, unspecified: Secondary | ICD-10-CM | POA: Diagnosis not present

## 2022-06-15 DIAGNOSIS — C719 Malignant neoplasm of brain, unspecified: Secondary | ICD-10-CM | POA: Diagnosis not present

## 2022-06-15 DIAGNOSIS — C7931 Secondary malignant neoplasm of brain: Secondary | ICD-10-CM

## 2022-06-15 MED ORDER — GADOBENATE DIMEGLUMINE 529 MG/ML IV SOLN
18.0000 mL | Freq: Once | INTRAVENOUS | Status: AC | PRN
  Administered 2022-06-15: 18 mL via INTRAVENOUS

## 2022-06-18 ENCOUNTER — Inpatient Hospital Stay: Payer: 59

## 2022-06-19 ENCOUNTER — Other Ambulatory Visit: Payer: Self-pay | Admitting: Radiation Therapy

## 2022-06-19 ENCOUNTER — Inpatient Hospital Stay (HOSPITAL_BASED_OUTPATIENT_CLINIC_OR_DEPARTMENT_OTHER): Payer: 59 | Admitting: Internal Medicine

## 2022-06-19 VITALS — BP 134/88 | HR 114 | Temp 98.0°F | Resp 16 | Ht 71.5 in | Wt 190.1 lb

## 2022-06-19 DIAGNOSIS — Z79899 Other long term (current) drug therapy: Secondary | ICD-10-CM | POA: Diagnosis not present

## 2022-06-19 DIAGNOSIS — Z923 Personal history of irradiation: Secondary | ICD-10-CM | POA: Diagnosis not present

## 2022-06-19 DIAGNOSIS — C7931 Secondary malignant neoplasm of brain: Secondary | ICD-10-CM | POA: Diagnosis not present

## 2022-06-19 DIAGNOSIS — E538 Deficiency of other specified B group vitamins: Secondary | ICD-10-CM | POA: Diagnosis not present

## 2022-06-19 DIAGNOSIS — Z5112 Encounter for antineoplastic immunotherapy: Secondary | ICD-10-CM | POA: Diagnosis not present

## 2022-06-19 DIAGNOSIS — C3411 Malignant neoplasm of upper lobe, right bronchus or lung: Secondary | ICD-10-CM | POA: Diagnosis not present

## 2022-06-19 DIAGNOSIS — I10 Essential (primary) hypertension: Secondary | ICD-10-CM | POA: Diagnosis not present

## 2022-06-19 DIAGNOSIS — Z5111 Encounter for antineoplastic chemotherapy: Secondary | ICD-10-CM | POA: Diagnosis not present

## 2022-06-19 DIAGNOSIS — C7951 Secondary malignant neoplasm of bone: Secondary | ICD-10-CM | POA: Diagnosis not present

## 2022-06-19 NOTE — Progress Notes (Signed)
[facility] [place], [place] [address] [phone]   Interval Evaluation  Date of Service: 06/19/22 Patient Name: RAVIN DENARDO Patient MRN: [id number] Patient DOB: 1956-02-04 Provider: Ventura Sellers, MD  Identifying Statement:  BRANSEN FASSNACHT is a 66 y.o. male with Malignant neoplasm metastatic to brain [facility])   Primary Cancer:  Oncologic History: Oncology History  Adenocarcinoma of right lung, stage 4 ([place])  04/25/2021 Initial Diagnosis   Adenocarcinoma of right lung, stage 4 ([place])   04/25/2021 Cancer Staging   Staging form: Lung, AJCC 8th Edition - Clinical: Stage IVB (cT1b, cN3, cM1c) - Signed by Curt Bears, MD on 04/25/2021   05/08/2021 -  Chemotherapy   Patient is on Treatment Plan : LUNG Carboplatin (5) + Pemetrexed (500) + Pembrolizumab (200) D1 q21d Induction x 4 cycles / Maintenance Pemetrexed (500) + Pembrolizumab (200) D1 q21d      CNS Oncologic History 05/04/21: Radiosurgery to 4 brain metastases, 5 fractions to pituitary lesion  Interval History: EDRICK WHITEHORN presents today for follow up after recent MRI brain.  No new or progressive changes.  No issues with visual impairment as prior.  Denies seizures and headaches.  H+P (05/23/21) Patient presented to medical attention initially on 03/27/21 with new onset left eyelid drooping.  CNS imaging was obtained, which demonstrated multiple enhancing lesions consistent with brain metastases.  Systemic workup demonstrated lung cancer, which was confirmed through endobronchial biopsy.  He underwent single fraction radiosurgery to brain lesions, and fractionated radiosurgery to pituitary lesion.  Following radiation, eyelid drooping improved, but he developed "tunnel vision" visual deficits, described as "like looking through plastic".  This has been more or less static over the past week or so, though it did improve transiently after dosing decadron with  chemotherapy on 05/08/21.   Medications: Current Outpatient Medications on File Prior to Visit  Medication Sig Dispense Refill   cholecalciferol (VITAMIN D) 1000 UNITS tablet Take 1,000 Units by mouth every evening.     Continuous Blood Gluc Sensor (FREESTYLE LIBRE 3 SENSOR) MISC Place 1 sensor on the skin every 14 days. Use to check glucose continuously 2 each 2   dexamethasone (DECADRON) 1 MG tablet Take 2 tablets by mouth daily. 180 tablet 1   docusate sodium (COLACE) 100 MG capsule Take 100 mg by mouth daily.     folic acid (FOLVITE) 1 MG tablet Take 1 tablet by mouth daily. 30 tablet 4   Krill Oil 300 MG CAPS Take 300 mg by mouth every evening.     levothyroxine (SYNTHROID) 50 MCG tablet Take 1 tablet (50 mcg total) by mouth daily before breakfast. 90 tablet 1   loratadine (CLARITIN) 10 MG tablet Take 10 mg by mouth every evening.      losartan (COZAAR) 50 MG tablet Take 1 and 1/2 tablets (75 mg total) by mouth daily. 135 tablet 0   mirtazapine (REMERON) 15 MG tablet Take 1 tablet by mouth at bedtime. 90 tablet 1   Multiple Vitamins-Minerals (MULTIVITAMIN GUMMIES ADULT PO) Take 2 each by mouth daily.     pantoprazole (PROTONIX) 40 MG tablet Take 1 tablet by mouth every evening. 90 tablet 1   PRESCRIPTION MEDICATION Testosterone injection     prochlorperazine (COMPAZINE) 10 MG tablet Take 1 tablet (10 mg total) by mouth every 6 (six) hours as needed for nausea or vomiting. 30 tablet 0   rosuvastatin (CRESTOR) 20 MG tablet Take 1 tablet by mouth daily. [place]  tablet 1   Semaglutide,0.25 or 0.5MG /DOS, (OZEMPIC, 0.25 OR 0.5 MG/DOSE,) 2 MG/3ML SOPN Inject 0.5 mg into the skin once a week. 9 mL 0   SYRINGE-NEEDLE, DISP, 3 ML 21G X 1-1/2" 3 ML MISC Use to inject testosterone every week 100 each 2   testosterone cypionate (DEPOTESTOTERONE CYPIONATE) 100 MG/ML injection Inject 0.5 mLs (50 mg total) into the muscle once a week. For IM use only. 10 mL 0   venlafaxine XR (EFFEXOR-XR) 75 MG 24 hr capsule  Take 1 capsule by mouth daily with breakfast. 90 capsule 1   No current facility-administered medications on file prior to visit.    Allergies: No Known Allergies Past Medical History:  Past Medical History:  Diagnosis Date   Cervical spondylolysis    Essential hypertension    GERD (gastroesophageal reflux disease)    History of kidney stones    History of migraine    Hyperlipidemia    Hypertension    PONV (postoperative nausea and vomiting)    Type 2 diabetes mellitus ([place])    Past Surgical History:  Past Surgical History:  Procedure Laterality Date   Bilateral inguinal hernia repair     BRONCHIAL NEEDLE ASPIRATION BIOPSY  04/05/2021   Procedure: BRONCHIAL NEEDLE ASPIRATION BIOPSIES;  Surgeon: Garner Nash, DO;  Location: [place];  Service: Pulmonary;;   COLONOSCOPY  01/23/2012   Procedure: COLONOSCOPY;  Surgeon: Daneil Dolin, MD;  [facility];  Service: Endoscopy;  Laterality: N/A;  9:30 AM   COLONOSCOPY N/A 10/11/2015   Procedure: COLONOSCOPY;  Surgeon: Daneil Dolin, MD;  [facility];  Service: Endoscopy;  Laterality: N/A;  830   COLONOSCOPY N/A 10/14/2019   Procedure: COLONOSCOPY;  Surgeon: Daneil Dolin, MD;  [facility];  Service: Endoscopy;  Laterality: N/A;  1:45   POLYPECTOMY  10/14/2019   Procedure: POLYPECTOMY;  Surgeon: Daneil Dolin, MD;  [facility];  Service: Endoscopy;;  ascending colon, descending colon   VIDEO BRONCHOSCOPY WITH ENDOBRONCHIAL ULTRASOUND N/A 04/05/2021   Procedure: VIDEO BRONCHOSCOPY WITH ENDOBRONCHIAL ULTRASOUND;  Surgeon: Garner Nash, DO;  Location: [facility];  Service: Pulmonary;  Laterality: N/A;   Social History:  Social History   Socioeconomic History   Marital status: Married    Spouse name: Not on file   Number of children: Not on file   Years of education: Not on file   Highest education level: Not on file  Occupational History   Not on file  Tobacco Use   Smoking  status: Every Day    Packs/day: 0.50    Years: 30.00    Total pack years: 15.00    Types: Cigarettes    Start date: 10/30/1973   Smokeless tobacco: Never  Vaping Use   Vaping Use: Never used  Substance and Sexual Activity   Alcohol use: Yes    Alcohol/week: 0.0 standard drinks of alcohol    Comment: One drink every 6 months.   Drug use: No   Sexual activity: Yes  Other Topics Concern   Not on file  Social History Narrative   Not on file   Social Determinants of Health   Financial Resource Strain: Not on file  Food Insecurity: Not on file  Transportation Needs: Not on file  Physical Activity: Not on file  Stress: Not on file  Social Connections: Not on file  Intimate Partner Violence: Not on file   Family History:  Family History  Problem Relation Age  of Onset   Hypertension Mother    Diabetes Mother    Heart attack Mother    Hypertension Father    Heart attack Father    Heart attack Brother    Colon cancer Neg Hx     Review of Systems: Constitutional: Doesn't report fevers, chills or abnormal weight loss Eyes: Doesn't report blurriness of vision Ears, nose, mouth, throat, and face: Doesn't report sore throat Respiratory: Doesn't report cough, dyspnea or wheezes Cardiovascular: Doesn't report palpitation, chest discomfort  Gastrointestinal:  Doesn't report nausea, constipation, diarrhea GU: Doesn't report incontinence Skin: Doesn't report skin rashes Neurological: Per HPI Musculoskeletal: Doesn't report joint pain Behavioral/Psych: Doesn't report anxiety  Physical Exam: Vitals:   06/19/22 1202  BP: 134/88  Pulse: (!) 114  Resp: 16  Temp: 98 F (36.7 C)  SpO2: 98%   KPS: 90. General: Alert, cooperative, pleasant, in no acute distress Head: Normal EENT: No conjunctival injection or scleral icterus.  Lungs: Resp effort normal Cardiac: Regular rate Abdomen: Non-distended abdomen Skin: No rashes cyanosis or petechiae. Extremities: No clubbing or  edema  Neurologic Exam: Mental Status: Awake, alert, attentive to examiner. Oriented to self and environment. Language is fluent with intact comprehension.  Cranial Nerves: Visual acuity is grossly normal. Visual fields are full. Extra-ocular movements intact. No ptosis. Face is symmetric Motor: Tone and bulk are normal. Power is full in both arms and legs. Reflexes are symmetric, no pathologic reflexes present.  Sensory: Intact to light touch Gait: Normal.   Labs: I have reviewed the data as listed    Component Value Date/Time   NA 141 06/12/2022 1001   NA 144 01/10/2021 0849   K 3.8 06/12/2022 1001   CL 105 06/12/2022 1001   CO2 27 06/12/2022 1001   GLUCOSE 180 (H) 06/12/2022 1001   BUN 14 06/12/2022 1001   BUN 15 01/10/2021 0849   CREATININE 0.96 06/12/2022 1001   CALCIUM 9.3 06/12/2022 1001   PROT 6.4 (L) 06/12/2022 1001   PROT 6.3 01/10/2021 0849   ALBUMIN 3.9 06/12/2022 1001   ALBUMIN 4.2 01/10/2021 0849   AST 16 06/12/2022 1001   ALT 25 06/12/2022 1001   ALKPHOS 68 06/12/2022 1001   BILITOT 0.2 (L) 06/12/2022 1001   GFRNONAA >60 06/12/2022 1001   GFRAA 82 07/14/2020 0945   Lab Results  Component Value Date   WBC 12.8 (H) 06/12/2022   NEUTROABS 10.6 (H) 06/12/2022   HGB 12.5 (L) 06/12/2022   HCT 38.9 (L) 06/12/2022   MCV 89.8 06/12/2022   PLT 277 06/12/2022   Imaging:  [place] Clinician Interpretation: I have personally reviewed the CNS images as listed.  My interpretation, in the context of the patient's clinical presentation, is stable disease  MR BRAIN W WO CONTRAST  Result Date: 06/16/2022 CLINICAL DATA:  Brain/CNS neoplasm, assess treatment response. EXAM: MRI HEAD WITHOUT AND WITH CONTRAST TECHNIQUE: Multiplanar, multiecho pulse sequences of the brain and surrounding structures were obtained without and with intravenous contrast. CONTRAST:  57mL MULTIHANCE GADOBENATE DIMEGLUMINE 529 MG/ML IV SOLN COMPARISON:  02/09/2022 FINDINGS: Brain: No acute infarct, mass  effect or extra-axial collection. No acute or chronic hemorrhage. There is multifocal hyperintense T2-weighted signal within the white matter. Parenchymal volume and CSF spaces are normal. The midline structures are normal. Unchanged 3 mm contrast enhancing lesion of the left cerebellum (series 13, image 45). Unchanged punctate focus of contrast enhancement in the right posterior parietal lobe (series 13, image 90). No edema around either lesion. No new lesions. Vascular:  Major flow voids are preserved. Skull and upper cervical spine: Normal calvarium and skull base. Visualized upper cervical spine and soft tissues are normal. Sinuses/Orbits:No paranasal sinus fluid levels or advanced mucosal thickening. No mastoid or middle ear effusion. Normal orbits. IMPRESSION: Unchanged 3 mm contrast enhancing lesion of the left cerebellum and punctate focus of contrast enhancement in the right posterior parietal lobe. No new lesions. Electronically Signed   By: Ulyses Jarred M.D.   On: 06/16/2022 16:31     Assessment/Plan Malignant neoplasm metastatic to brain [facility])  Yedidya D Cherne is clinically and radiographically stable today.  No new or progressive changes.  Maintains activity, independence.  For pan-hypopit, will continue to follow with Dr. Dorris Fetch.    He will con't to undergo chemotherapy with Dr. Julien Nordmann.  We appreciate the opportunity to participate in the care of VIGGO PERKO.   We ask that MICK TANGUMA return to clinic in 4 months following next brain MRI, or sooner as needed.  All questions were answered. The patient knows to call the clinic with any problems, questions or concerns. No barriers to learning were detected.  The total time spent in the encounter was 30 minutes and more than 50% was on counseling and review of test results   Ventura Sellers, MD Medical Director of Neuro-Oncology [facility] [place] 06/19/22 12:11 PM

## 2022-06-20 ENCOUNTER — Telehealth: Payer: Self-pay | Admitting: Internal Medicine

## 2022-06-20 ENCOUNTER — Other Ambulatory Visit: Payer: Self-pay

## 2022-06-20 NOTE — Telephone Encounter (Signed)
Left patient message regarding next appointment 01/16

## 2022-06-21 ENCOUNTER — Other Ambulatory Visit: Payer: Self-pay

## 2022-06-26 ENCOUNTER — Other Ambulatory Visit (HOSPITAL_COMMUNITY): Payer: Self-pay

## 2022-06-29 NOTE — Progress Notes (Unsigned)
[facility] OFFICE PROGRESS NOTE  [name], [place] [address]  DIAGNOSIS: Stage IV (T1b, N3, M1C) non-small cell lung cancer, favoring adenocarcinoma presented with right upper lobe lung nodule in addition to right hilar, subcarinal and bilateral mediastinal as well as supraclavicular lymphadenopathy in addition to bone and brain metastasis diagnosed in June 2022.     PD-L1 expression 80%.     Molecular Studies:  Biomarker Findings Microsatellite status - MS-Stable Tumor Mutational Burden - 6 Muts/Mb Genomic Findings For a complete list of the genes assayed, please refer to the Appendix. KRAS G12C, amplification ATM S470* CCND1 amplification - equivocal? HGF amplification - equivocal? MYC amplification - equivocal? FGF19 amplification - equivocal? FGF3 amplification - equivocal? FGF4 amplification - equivocal? NFKBIA amplification NKX2-1 amplification RAD21 amplification - equivocal? [id number]*26 TERT promoter -124C>T TP53 rearrangement exon 9 7 Disease relevant genes with no reportable alterations: ALK, BRAF, EGFR, ERBB2, MET, RET, ROS1  PRIOR THERAPY: SRS to the metastatic brain lesions under the care of Dr. MTammi Klippel  Last treatment on 05/04/2021.  CURRENT THERAPY: Palliative systemic chemotherapy with carboplatin for an AUC 5, Alimta 500 mg/m2 and, Keytruda 200 mg IV every 3 weeks.  First dose expected on 05/08/2021.  Status post 20 cycles.  Starting from cycle #5 he is on maintenance treatment with Alimta and Keytruda every 3 weeks.  INTERVAL HISTORY: TCalixto PavelGoins 66y.o. male returns to the clinic today for a follow-up visit accompanied by his daughter and wife. The patient is feeling fairly well today without any new concerning complaints.  The patient is feeling fairly well today without any new concerning complaints.  Today, he denies any fever, chills, night sweats, or unexplained weight loss.  Denies any  significant dyspnea on exertion out of the ordinary but reports his breathing is not what it used to be.  His breathing may be labored if he is carrying heavy objects.  Denies any cough, chest pain, or hemoptysis.  Denies any nausea, vomiting, unusual diarrhea, or constipation. The patient denies any unusual headaches or vision changes. He mentions his breathing has actually improved.  He did recently have a follow-up visit with Dr. VMickeal Skinner  He denies any rashes or skin changes.  He is here today for evaluation and repeat blood work before undergoing cycle #21.   MEDICAL HISTORY: Past Medical History:  Diagnosis Date   Cervical spondylolysis    Essential hypertension    GERD (gastroesophageal reflux disease)    History of kidney stones    History of migraine    Hyperlipidemia    Hypertension    PONV (postoperative nausea and vomiting)    Type 2 diabetes mellitus (HCC)     ALLERGIES:  has No Known Allergies.  MEDICATIONS:  Current Outpatient Medications  Medication Sig Dispense Refill   cholecalciferol (VITAMIN D) 1000 UNITS tablet Take 1,000 Units by mouth every evening.     Continuous Blood Gluc Sensor (FREESTYLE LIBRE 3 SENSOR) MISC Place 1 sensor on the skin every 14 days. Use to check glucose continuously 2 each 2   dexamethasone (DECADRON) 1 MG tablet Take 2 tablets by mouth daily. 180 tablet 1   docusate sodium (COLACE) 100 MG capsule Take 100 mg by mouth daily.     folic acid (FOLVITE) 1 MG tablet Take 1 tablet by mouth daily. 30 tablet 4   Krill Oil 300 MG CAPS Take 300 mg by mouth every evening.     levothyroxine (SYNTHROID) 50 MCG  tablet Take 1 tablet (50 mcg total) by mouth daily before breakfast. 90 tablet 1   loratadine (CLARITIN) 10 MG tablet Take 10 mg by mouth every evening.      losartan (COZAAR) 50 MG tablet Take 1 and 1/2 tablets (75 mg total) by mouth daily. 135 tablet 0   mirtazapine (REMERON) 15 MG tablet Take 1 tablet by mouth at bedtime. 90 tablet 1   Multiple  Vitamins-Minerals (MULTIVITAMIN GUMMIES ADULT PO) Take 2 each by mouth daily.     pantoprazole (PROTONIX) 40 MG tablet Take 1 tablet by mouth every evening. 90 tablet 1   PRESCRIPTION MEDICATION Testosterone injection     prochlorperazine (COMPAZINE) 10 MG tablet Take 1 tablet (10 mg total) by mouth every 6 (six) hours as needed for nausea or vomiting. 30 tablet 0   rosuvastatin (CRESTOR) 20 MG tablet Take 1 tablet by mouth daily. 90 tablet 1   Semaglutide,0.25 or 0.5MG/DOS, (OZEMPIC, 0.25 OR 0.5 MG/DOSE,) 2 MG/3ML SOPN Inject 0.5 mg into the skin once a week. 9 mL 0   SYRINGE-NEEDLE, DISP, 3 ML 21G X 1-1/2" 3 ML MISC Use to inject testosterone every week 100 each 2   testosterone cypionate (DEPOTESTOTERONE CYPIONATE) 100 MG/ML injection Inject 0.5 mLs (50 mg total) into the muscle once a week. For IM use only. 10 mL 0   venlafaxine XR (EFFEXOR-XR) 75 MG 24 hr capsule Take 1 capsule by mouth daily with breakfast. 90 capsule 1   No current facility-administered medications for this visit.    SURGICAL HISTORY:  Past Surgical History:  Procedure Laterality Date   Bilateral inguinal hernia repair     BRONCHIAL NEEDLE ASPIRATION BIOPSY  04/05/2021   Procedure: BRONCHIAL NEEDLE ASPIRATION BIOPSIES;  Surgeon: Garner Nash, DO;  Location: [facility];  Service: Pulmonary;;   COLONOSCOPY  01/23/2012   Procedure: COLONOSCOPY;  Surgeon: Daneil Dolin, MD;  [facility];  Service: Endoscopy;  Laterality: N/A;  9:30 AM   COLONOSCOPY N/A 10/11/2015   Procedure: COLONOSCOPY;  Surgeon: Daneil Dolin, MD;  [facility];  Service: Endoscopy;  Laterality: N/A;  830   COLONOSCOPY N/A 10/14/2019   Procedure: COLONOSCOPY;  Surgeon: Daneil Dolin, MD;  [facility];  Service: Endoscopy;  Laterality: N/A;  1:45   POLYPECTOMY  10/14/2019   Procedure: POLYPECTOMY;  Surgeon: Daneil Dolin, MD;  [facility];  Service: Endoscopy;;  ascending colon, descending colon    VIDEO BRONCHOSCOPY WITH ENDOBRONCHIAL ULTRASOUND N/A 04/05/2021   Procedure: VIDEO BRONCHOSCOPY WITH ENDOBRONCHIAL ULTRASOUND;  Surgeon: Garner Nash, DO;  Location: [facility];  Service: Pulmonary;  Laterality: N/A;    REVIEW OF SYSTEMS:   Constitutional: Positive for baseline fatigue. Negative for appetite change, chills, fever and unexpected weight change.  HENT: Negative for mouth sores, nosebleeds, sore throat and trouble swallowing.   Eyes: Negative for eye problems and icterus.  Respiratory: Positive for occasional baseline shortness of breath. Negative for cough, hemoptysis, and wheezing.   Cardiovascular: Negative for chest pain and leg swelling.  Gastrointestinal:  Negative for abdominal pain, diarrhea, constipation, nausea and vomiting.  Genitourinary: Negative for bladder incontinence, difficulty urinating, dysuria, frequency and hematuria.   Musculoskeletal: Negative for back pain, gait problem, neck pain and neck stiffness.  Skin: Negative for itching and rash.  Neurological: Negative for dizziness, extremity weakness, gait problem, headaches, light-headedness and seizures.  Hematological: Negative for adenopathy. Does not bruise/bleed easily.  Psychiatric/Behavioral: Negative for confusion, depression and sleep  disturbance. The patient is not nervous/anxious.     PHYSICAL EXAMINATION:  Blood pressure (!) 146/84, pulse 94, temperature 97.9 F (36.6 C), temperature source Oral, resp. rate 15, weight 189 lb 14.4 oz (86.1 kg), SpO2 97 %.  ECOG PERFORMANCE STATUS: 1  Physical Exam  Constitutional: Oriented to person, place, and time and well-developed, well-nourished, and in no distress.  HENT:  Head: Normocephalic and atraumatic.  Mouth/Throat: Oropharynx is clear and moist. No oropharyngeal exudate.  Eyes: Conjunctivae are normal. Right eye exhibits no discharge. Left eye exhibits no discharge. No scleral icterus.  Neck: Normal range of motion. Neck supple.   Cardiovascular: Normal rate, regular rhythm, normal heart sounds and intact distal pulses.   Pulmonary/Chest: Effort normal and breath sounds normal. No respiratory distress. No wheezes. No rales.  Abdominal: Soft. Bowel sounds are normal. Exhibits no distension and no mass. There is no tenderness.  Musculoskeletal: Normal range of motion. Exhibits no edema.  Lymphadenopathy:    No cervical adenopathy.  Neurological: Alert and oriented to person, place, and time. Exhibits normal muscle tone. Gait normal. Coordination normal.  Skin: Skin is warm and dry. No rash noted. Not diaphoretic. No erythema. No pallor. Has some bruising on his upper extremities bilaterally.  Psychiatric: Mood, memory and judgment normal.  Vitals reviewed.  LABORATORY DATA: Lab Results  Component Value Date   WBC 9.8 07/02/2022   HGB 12.1 (L) 07/02/2022   HCT 38.6 (L) 07/02/2022   MCV 91.0 07/02/2022   PLT 243 07/02/2022      Chemistry      Component Value Date/Time   NA 141 07/02/2022 0935   NA 144 01/10/2021 0849   K 3.7 07/02/2022 0935   CL 107 07/02/2022 0935   CO2 28 07/02/2022 0935   BUN 15 07/02/2022 0935   BUN 15 01/10/2021 0849   CREATININE 1.04 07/02/2022 0935      Component Value Date/Time   CALCIUM 8.9 07/02/2022 0935   ALKPHOS 60 07/02/2022 0935   AST 25 07/02/2022 0935   ALT 28 07/02/2022 0935   BILITOT 0.3 07/02/2022 0935       RADIOGRAPHIC STUDIES:  MR BRAIN W WO CONTRAST  Result Date: 06/16/2022 CLINICAL DATA:  Brain/CNS neoplasm, assess treatment response. EXAM: MRI HEAD WITHOUT AND WITH CONTRAST TECHNIQUE: Multiplanar, multiecho pulse sequences of the brain and surrounding structures were obtained without and with intravenous contrast. CONTRAST:  35m MULTIHANCE GADOBENATE DIMEGLUMINE 529 MG/ML IV SOLN COMPARISON:  02/09/2022 FINDINGS: Brain: No acute infarct, mass effect or extra-axial collection. No acute or chronic hemorrhage. There is multifocal hyperintense T2-weighted  signal within the white matter. Parenchymal volume and CSF spaces are normal. The midline structures are normal. Unchanged 3 mm contrast enhancing lesion of the left cerebellum (series 13, image 45). Unchanged punctate focus of contrast enhancement in the right posterior parietal lobe (series 13, image 90). No edema around either lesion. No new lesions. Vascular: Major flow voids are preserved. Skull and upper cervical spine: Normal calvarium and skull base. Visualized upper cervical spine and soft tissues are normal. Sinuses/Orbits:No paranasal sinus fluid levels or advanced mucosal thickening. No mastoid or middle ear effusion. Normal orbits. IMPRESSION: Unchanged 3 mm contrast enhancing lesion of the left cerebellum and punctate focus of contrast enhancement in the right posterior parietal lobe. No new lesions. Electronically Signed   By: KUlyses JarredM.D.   On: 06/16/2022 16:31     ASSESSMENT/PLAN:  This is a very pleasant 66year old Caucasian male diagnosed with stage IV (T1b, N3,  M1 C) non-small cell lung cancer, favoring adenocarcinoma.  The patient presented with a right upper lobe lung nodule in addition to right hilar, subcarinal, and bilateral mediastinal lymphadenopathy as well as supraclavicular lymphadenopathy.  The patient also has metastatic disease to the brain.  He was diagnosed in June 2022.  His PD-L1 expression is 80%.  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   The patient completed SRS to the brain lesions under the care of Dr. Tammi Klippel.  His last treatment was on 05/04/2021. 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    The patient is currently undergoing systemic chemotherapy with carboplatin for an AUC of 5, Alimta 500 mg per metered squared, Keytruda 200 mg IV every 3 weeks.  Status post 20 cycles. Starting from cycle #5, the patient  started maintenance Alimta and Keytruda IV every 3 weeks.     Labs were reviewed. His TSH is pending. Recommend that he proceed with cycle #21 today scheduled.    I will arrange for restaging CT scan of the chest, abdomen, pelvis prior to starting his next cycle of treatment. The patients most recent repeat scan at his last appointment which showed multiple small bilateral pulmonary nodules similar to prior exam which is non-specific and attention to follow up was recommended.  We will see him back for follow-up visit in 3 weeks for evaluation and to review his scan before starting cycle #22.  The patient was advised to call immediately if he has any concerning symptoms in the interval. The patient voices understanding of current disease status and treatment options and is in agreement with the current care plan. All questions were answered. The patient knows to call the clinic with any problems, questions or concerns. We can certainly see the patient much sooner if necessary    Orders Placed This Encounter  Procedures   CT Chest W Contrast    Standing Status:   Future    Standing Expiration Date:   07/02/2023    Order Specific Question:   If indicated for the ordered procedure, I authorize the administration of contrast media per Radiology protocol    Answer:   Yes    Order Specific Question:   Preferred imaging location?    Answer:   [facility]   CT Abdomen Pelvis W Contrast    Standing Status:   Future    Standing Expiration Date:   07/02/2023    Order Specific Question:   If indicated for the ordered procedure, I authorize the administration of contrast media per Radiology protocol    Answer:   Yes    Order Specific Question:   Preferred imaging location?    Answer:   [facility]    Order Specific Question:   Is Oral Contrast requested for this exam?    Answer:   Yes, Per Radiology protocol      The total time spent in the appointment was 20-29 minutes.    Tieler Cournoyer L Ericson Nafziger, PA-C 07/02/22

## 2022-07-02 ENCOUNTER — Inpatient Hospital Stay (HOSPITAL_BASED_OUTPATIENT_CLINIC_OR_DEPARTMENT_OTHER): Payer: 59 | Admitting: Physician Assistant

## 2022-07-02 ENCOUNTER — Inpatient Hospital Stay: Payer: 59

## 2022-07-02 ENCOUNTER — Other Ambulatory Visit: Payer: Self-pay

## 2022-07-02 VITALS — BP 146/84 | HR 94 | Temp 97.9°F | Resp 15 | Wt 189.9 lb

## 2022-07-02 DIAGNOSIS — Z5111 Encounter for antineoplastic chemotherapy: Secondary | ICD-10-CM

## 2022-07-02 DIAGNOSIS — C3411 Malignant neoplasm of upper lobe, right bronchus or lung: Secondary | ICD-10-CM | POA: Diagnosis not present

## 2022-07-02 DIAGNOSIS — C7951 Secondary malignant neoplasm of bone: Secondary | ICD-10-CM | POA: Diagnosis not present

## 2022-07-02 DIAGNOSIS — Z923 Personal history of irradiation: Secondary | ICD-10-CM | POA: Diagnosis not present

## 2022-07-02 DIAGNOSIS — C3491 Malignant neoplasm of unspecified part of right bronchus or lung: Secondary | ICD-10-CM

## 2022-07-02 DIAGNOSIS — C7931 Secondary malignant neoplasm of brain: Secondary | ICD-10-CM | POA: Diagnosis not present

## 2022-07-02 DIAGNOSIS — I10 Essential (primary) hypertension: Secondary | ICD-10-CM | POA: Diagnosis not present

## 2022-07-02 DIAGNOSIS — Z5112 Encounter for antineoplastic immunotherapy: Secondary | ICD-10-CM | POA: Diagnosis not present

## 2022-07-02 DIAGNOSIS — Z79899 Other long term (current) drug therapy: Secondary | ICD-10-CM | POA: Diagnosis not present

## 2022-07-02 DIAGNOSIS — E538 Deficiency of other specified B group vitamins: Secondary | ICD-10-CM | POA: Diagnosis not present

## 2022-07-02 LAB — CBC WITH DIFFERENTIAL (CANCER CENTER ONLY)
Abs Immature Granulocytes: 0.05 10*3/uL (ref 0.00–0.07)
Basophils Absolute: 0.1 10*3/uL (ref 0.0–0.1)
Basophils Relative: 1 %
Eosinophils Absolute: 0.1 10*3/uL (ref 0.0–0.5)
Eosinophils Relative: 1 %
HCT: 38.6 % — ABNORMAL LOW (ref 39.0–52.0)
Hemoglobin: 12.1 g/dL — ABNORMAL LOW (ref 13.0–17.0)
Immature Granulocytes: 1 %
Lymphocytes Relative: 20 %
Lymphs Abs: 2 10*3/uL (ref 0.7–4.0)
MCH: 28.5 pg (ref 26.0–34.0)
MCHC: 31.3 g/dL (ref 30.0–36.0)
MCV: 91 fL (ref 80.0–100.0)
Monocytes Absolute: 0.6 10*3/uL (ref 0.1–1.0)
Monocytes Relative: 6 %
Neutro Abs: 7 10*3/uL (ref 1.7–7.7)
Neutrophils Relative %: 71 %
Platelet Count: 243 10*3/uL (ref 150–400)
RBC: 4.24 MIL/uL (ref 4.22–5.81)
RDW: 18.3 % — ABNORMAL HIGH (ref 11.5–15.5)
WBC Count: 9.8 10*3/uL (ref 4.0–10.5)
nRBC: 0 % (ref 0.0–0.2)

## 2022-07-02 LAB — CMP (CANCER CENTER ONLY)
ALT: 28 U/L (ref 0–44)
AST: 25 U/L (ref 15–41)
Albumin: 3.8 g/dL (ref 3.5–5.0)
Alkaline Phosphatase: 60 U/L (ref 38–126)
Anion gap: 6 (ref 5–15)
BUN: 15 mg/dL (ref 8–23)
CO2: 28 mmol/L (ref 22–32)
Calcium: 8.9 mg/dL (ref 8.9–10.3)
Chloride: 107 mmol/L (ref 98–111)
Creatinine: 1.04 mg/dL (ref 0.61–1.24)
GFR, Estimated: >60 mL/min (ref >60)
Glucose, Bld: 117 mg/dL — ABNORMAL HIGH (ref 70–99)
Potassium: 3.7 mmol/L (ref 3.5–5.1)
Sodium: 141 mmol/L (ref 135–145)
Total Bilirubin: 0.3 mg/dL (ref 0.3–1.2)
Total Protein: 6.6 g/dL (ref 6.5–8.1)

## 2022-07-02 LAB — TSH: TSH: 1.331 u[IU]/mL (ref 0.350–4.500)

## 2022-07-02 MED ORDER — CYANOCOBALAMIN 1000 MCG/ML IJ SOLN
1000.0000 ug | Freq: Once | INTRAMUSCULAR | Status: AC
  Administered 2022-07-02: 1000 ug via INTRAMUSCULAR
  Filled 2022-07-02: qty 1

## 2022-07-02 MED ORDER — SODIUM CHLORIDE 0.9 % IV SOLN
500.0000 mg/m2 | Freq: Once | INTRAVENOUS | Status: AC
  Administered 2022-07-02: 1000 mg via INTRAVENOUS
  Filled 2022-07-02: qty 40

## 2022-07-02 MED ORDER — SODIUM CHLORIDE 0.9 % IV SOLN
200.0000 mg | Freq: Once | INTRAVENOUS | Status: AC
  Administered 2022-07-02: 200 mg via INTRAVENOUS
  Filled 2022-07-02: qty 8

## 2022-07-02 MED ORDER — PROCHLORPERAZINE MALEATE 10 MG PO TABS
10.0000 mg | ORAL_TABLET | Freq: Once | ORAL | Status: AC
  Administered 2022-07-02: 10 mg via ORAL
  Filled 2022-07-02: qty 1

## 2022-07-02 MED ORDER — SODIUM CHLORIDE 0.9 % IV SOLN: Freq: Once | INTRAVENOUS | Status: AC

## 2022-07-02 NOTE — Patient Instructions (Signed)
[facility]  Discharge Instructions: Thank you for choosing [facility] to provide your oncology and hematology care.   If you have a lab appointment with the [facility], please go directly to the [facility] and check in at the registration area.   Wear comfortable clothing and clothing appropriate for easy access to any Portacath or PICC line.   We strive to give you quality time with your provider. You may need to reschedule your appointment if you arrive late (15 or more minutes).  Arriving late affects you and other patients whose appointments are after yours.  Also, if you miss three or more appointments without notifying the office, you may be dismissed from the clinic at the provider's discretion.      For prescription refill requests, have your pharmacy contact our office and allow 72 hours for refills to be completed.    Today you received the following chemotherapy and/or immunotherapy agents: pembrolizumab, pemetrexed      To help prevent nausea and vomiting after your treatment, we encourage you to take your nausea medication as directed.  BELOW ARE SYMPTOMS THAT SHOULD BE REPORTED IMMEDIATELY: *FEVER GREATER THAN 100.4 F (38 C) OR HIGHER *CHILLS OR SWEATING *NAUSEA AND VOMITING THAT IS NOT CONTROLLED WITH YOUR NAUSEA MEDICATION *UNUSUAL SHORTNESS OF BREATH *UNUSUAL BRUISING OR BLEEDING *URINARY PROBLEMS (pain or burning when urinating, or frequent urination) *BOWEL PROBLEMS (unusual diarrhea, constipation, pain near the anus) TENDERNESS IN MOUTH AND THROAT WITH OR WITHOUT PRESENCE OF ULCERS (sore throat, sores in mouth, or a toothache) UNUSUAL RASH, SWELLING OR PAIN  UNUSUAL VAGINAL DISCHARGE OR ITCHING   Items with * indicate a potential emergency and should be followed up as soon as possible or go to the Emergency Department if any problems should occur.  Please show the CHEMOTHERAPY ALERT CARD or IMMUNOTHERAPY ALERT CARD  at check-in to the Emergency Department and triage nurse.  Should you have questions after your visit or need to cancel or reschedule your appointment, please contact [facility]  Dept: [phone]  and follow the prompts.  Office hours are 8:00 a.m. to 4:30 p.m. Monday - Friday. Please note that voicemails left after 4:00 p.m. may not be returned until the following business day.  We are closed weekends and major holidays. You have access to a nurse at all times for urgent questions. Please call the main number to the clinic Dept: [phone] and follow the prompts.   For any non-urgent questions, you may also contact your provider using MyChart. We now offer e-Visits for anyone 70 and older to request care online for non-urgent symptoms. For details visit [email or web address].   Also download the MyChart app! Go to the app store, search "MyChart", open the app, select [facility], and log in with your MyChart username and password.  Masks are optional in the cancer centers. If you would like for your care team to wear a mask while they are taking care of you, please let them know. You may have one support person who is at least 65 years old accompany you for your appointments.

## 2022-07-04 ENCOUNTER — Other Ambulatory Visit (HOSPITAL_COMMUNITY): Payer: Self-pay

## 2022-07-04 LAB — T4: T4, Total: 7.5 ug/dL (ref 4.5–12.0)

## 2022-07-09 ENCOUNTER — Other Ambulatory Visit: Payer: Self-pay | Admitting: Family Medicine

## 2022-07-09 DIAGNOSIS — E1165 Type 2 diabetes mellitus with hyperglycemia: Secondary | ICD-10-CM

## 2022-07-10 ENCOUNTER — Other Ambulatory Visit (HOSPITAL_COMMUNITY): Payer: Self-pay

## 2022-07-10 ENCOUNTER — Other Ambulatory Visit: Payer: Self-pay | Admitting: "Endocrinology

## 2022-07-10 MED ORDER — DEXAMETHASONE 1 MG PO TABS
2.0000 mg | ORAL_TABLET | Freq: Every day | ORAL | 1 refills | Status: DC
  Filled 2022-07-10: qty 180, 90d supply, fill #0

## 2022-07-10 MED ORDER — FREESTYLE LIBRE 3 SENSOR MISC
1.0000 | 2 refills | Status: DC
  Filled 2022-07-10: qty 2, 28d supply, fill #0
  Filled 2022-08-10: qty 2, 28d supply, fill #1
  Filled 2022-09-04: qty 2, 28d supply, fill #2

## 2022-07-11 ENCOUNTER — Other Ambulatory Visit: Payer: Self-pay

## 2022-07-11 ENCOUNTER — Other Ambulatory Visit (HOSPITAL_COMMUNITY): Payer: Self-pay

## 2022-07-13 ENCOUNTER — Other Ambulatory Visit (HOSPITAL_COMMUNITY): Payer: Self-pay

## 2022-07-20 ENCOUNTER — Ambulatory Visit (HOSPITAL_COMMUNITY)
Admission: RE | Admit: 2022-07-20 | Discharge: 2022-07-20 | Disposition: A | Discharge status: Home / Self Care | Payer: 59 | Source: Ambulatory Visit | Attending: Physician Assistant | Admitting: Physician Assistant

## 2022-07-20 DIAGNOSIS — C3490 Malignant neoplasm of unspecified part of unspecified bronchus or lung: Secondary | ICD-10-CM | POA: Diagnosis not present

## 2022-07-20 DIAGNOSIS — C3491 Malignant neoplasm of unspecified part of right bronchus or lung: Secondary | ICD-10-CM | POA: Insufficient documentation

## 2022-07-20 DIAGNOSIS — R918 Other nonspecific abnormal finding of lung field: Secondary | ICD-10-CM | POA: Diagnosis not present

## 2022-07-20 DIAGNOSIS — N200 Calculus of kidney: Secondary | ICD-10-CM | POA: Diagnosis not present

## 2022-07-20 DIAGNOSIS — K7689 Other specified diseases of liver: Secondary | ICD-10-CM | POA: Diagnosis not present

## 2022-07-20 DIAGNOSIS — J439 Emphysema, unspecified: Secondary | ICD-10-CM | POA: Diagnosis not present

## 2022-07-20 MED ORDER — SODIUM CHLORIDE (PF) 0.9 % IJ SOLN
INTRAMUSCULAR | Status: AC
  Filled 2022-07-20: qty 50

## 2022-07-20 MED ORDER — IOHEXOL 300 MG/ML  SOLN
100.0000 mL | Freq: Once | INTRAMUSCULAR | Status: AC | PRN
  Administered 2022-07-20: 100 mL via INTRAVENOUS

## 2022-07-23 ENCOUNTER — Inpatient Hospital Stay (HOSPITAL_BASED_OUTPATIENT_CLINIC_OR_DEPARTMENT_OTHER): Payer: 59 | Admitting: Internal Medicine

## 2022-07-23 ENCOUNTER — Inpatient Hospital Stay: Payer: 59

## 2022-07-23 ENCOUNTER — Inpatient Hospital Stay: Payer: 59 | Attending: Physician Assistant

## 2022-07-23 ENCOUNTER — Encounter: Payer: Self-pay | Admitting: Internal Medicine

## 2022-07-23 DIAGNOSIS — C3491 Malignant neoplasm of unspecified part of right bronchus or lung: Secondary | ICD-10-CM

## 2022-07-23 DIAGNOSIS — Z5112 Encounter for antineoplastic immunotherapy: Secondary | ICD-10-CM | POA: Diagnosis present

## 2022-07-23 DIAGNOSIS — C7931 Secondary malignant neoplasm of brain: Secondary | ICD-10-CM | POA: Diagnosis not present

## 2022-07-23 DIAGNOSIS — I10 Essential (primary) hypertension: Secondary | ICD-10-CM | POA: Diagnosis not present

## 2022-07-23 DIAGNOSIS — Z79899 Other long term (current) drug therapy: Secondary | ICD-10-CM | POA: Diagnosis not present

## 2022-07-23 DIAGNOSIS — Z5111 Encounter for antineoplastic chemotherapy: Secondary | ICD-10-CM | POA: Diagnosis present

## 2022-07-23 DIAGNOSIS — Z923 Personal history of irradiation: Secondary | ICD-10-CM | POA: Diagnosis not present

## 2022-07-23 DIAGNOSIS — E538 Deficiency of other specified B group vitamins: Secondary | ICD-10-CM | POA: Diagnosis not present

## 2022-07-23 DIAGNOSIS — C3411 Malignant neoplasm of upper lobe, right bronchus or lung: Secondary | ICD-10-CM | POA: Diagnosis present

## 2022-07-23 DIAGNOSIS — C7951 Secondary malignant neoplasm of bone: Secondary | ICD-10-CM | POA: Diagnosis not present

## 2022-07-23 DIAGNOSIS — F17210 Nicotine dependence, cigarettes, uncomplicated: Secondary | ICD-10-CM | POA: Diagnosis not present

## 2022-07-23 LAB — CMP (CANCER CENTER ONLY)
ALT: 26 U/L (ref 0–44)
AST: 19 U/L (ref 15–41)
Albumin: 4 g/dL (ref 3.5–5.0)
Alkaline Phosphatase: 74 U/L (ref 38–126)
Anion gap: 9 (ref 5–15)
BUN: 15 mg/dL (ref 8–23)
CO2: 27 mmol/L (ref 22–32)
Calcium: 9.2 mg/dL (ref 8.9–10.3)
Chloride: 105 mmol/L (ref 98–111)
Creatinine: 1.06 mg/dL (ref 0.61–1.24)
GFR, Estimated: >60 mL/min (ref >60)
Glucose, Bld: 158 mg/dL — ABNORMAL HIGH (ref 70–99)
Potassium: 3.8 mmol/L (ref 3.5–5.1)
Sodium: 141 mmol/L (ref 135–145)
Total Bilirubin: 0.3 mg/dL (ref 0.3–1.2)
Total Protein: 6.8 g/dL (ref 6.5–8.1)

## 2022-07-23 LAB — CBC WITH DIFFERENTIAL (CANCER CENTER ONLY)
Abs Immature Granulocytes: 0.07 10*3/uL (ref 0.00–0.07)
Basophils Absolute: 0.1 10*3/uL (ref 0.0–0.1)
Basophils Relative: 0 %
Eosinophils Absolute: 0 10*3/uL (ref 0.0–0.5)
Eosinophils Relative: 0 %
HCT: 43.7 % (ref 39.0–52.0)
Hemoglobin: 13.7 g/dL (ref 13.0–17.0)
Immature Granulocytes: 1 %
Lymphocytes Relative: 9 %
Lymphs Abs: 1 10*3/uL (ref 0.7–4.0)
MCH: 28.7 pg (ref 26.0–34.0)
MCHC: 31.4 g/dL (ref 30.0–36.0)
MCV: 91.4 fL (ref 80.0–100.0)
Monocytes Absolute: 0.5 10*3/uL (ref 0.1–1.0)
Monocytes Relative: 4 %
Neutro Abs: 9.9 10*3/uL — ABNORMAL HIGH (ref 1.7–7.7)
Neutrophils Relative %: 86 %
Platelet Count: 270 10*3/uL (ref 150–400)
RBC: 4.78 MIL/uL (ref 4.22–5.81)
RDW: 18.1 % — ABNORMAL HIGH (ref 11.5–15.5)
WBC Count: 11.5 10*3/uL — ABNORMAL HIGH (ref 4.0–10.5)
nRBC: 0 % (ref 0.0–0.2)

## 2022-07-23 LAB — TSH: TSH: 1.076 u[IU]/mL (ref 0.350–4.500)

## 2022-07-23 MED ORDER — SODIUM CHLORIDE 0.9 % IV SOLN: Freq: Once | INTRAVENOUS | Status: AC

## 2022-07-23 MED ORDER — CYANOCOBALAMIN 1000 MCG/ML IJ SOLN
1000.0000 ug | Freq: Once | INTRAMUSCULAR | Status: DC
  Filled 2022-07-23: qty 1

## 2022-07-23 MED ORDER — PROCHLORPERAZINE MALEATE 10 MG PO TABS
10.0000 mg | ORAL_TABLET | Freq: Once | ORAL | Status: AC
  Administered 2022-07-23: 10 mg via ORAL
  Filled 2022-07-23: qty 1

## 2022-07-23 MED ORDER — SODIUM CHLORIDE 0.9 % IV SOLN
200.0000 mg | Freq: Once | INTRAVENOUS | Status: AC
  Administered 2022-07-23: 200 mg via INTRAVENOUS
  Filled 2022-07-23: qty 200

## 2022-07-23 MED ORDER — SODIUM CHLORIDE 0.9 % IV SOLN
500.0000 mg/m2 | Freq: Once | INTRAVENOUS | Status: AC
  Administered 2022-07-23: 1000 mg via INTRAVENOUS
  Filled 2022-07-23: qty 40

## 2022-07-23 NOTE — Progress Notes (Signed)
[facility] [phone]   [phone]  OFFICE PROGRESS NOTE  [name], [place] [address]  DIAGNOSIS: Stage IV (T1b, N3, M1C) non-small cell lung cancer, favoring adenocarcinoma presented with right upper lobe lung nodule in addition to right hilar, subcarinal and bilateral mediastinal as well as supraclavicular lymphadenopathy in addition to bone and brain metastasis diagnosed in June 2022.     PD-L1 expression 80%.     Molecular Studies:  Biomarker Findings Microsatellite status - MS-Stable Tumor Mutational Burden - 6 Muts/Mb Genomic Findings For a complete list of the genes assayed, please refer to the Appendix. KRAS G12C, amplification ATM S470* CCND1 amplification - equivocal? HGF amplification - equivocal? MYC amplification - equivocal? FGF19 amplification - equivocal? FGF3 amplification - equivocal? FGF4 amplification - equivocal? NFKBIA amplification NKX2-1 amplification RAD21 amplification - equivocal? [id number]*26 TERT promoter -124C>T TP53 rearrangement exon 9 7 Disease relevant genes with no reportable alterations: ALK, BRAF, EGFR, ERBB2, MET, RET, ROS1   PRIOR THERAPY: SRS to the metastatic brain lesions under the care of Dr. MTammi Klippel  Last treatment on 05/04/2021.   CURRENT THERAPY: Palliative systemic chemotherapy with carboplatin for an AUC 5, Alimta 500 mg/m2 and, Keytruda 200 mg IV every 3 weeks.  First dose expected on 05/08/2021.  Status post 21 cycles.  Starting from cycle #5 he is on maintenance treatment with Alimta and Keytruda every 3 weeks.  INTERVAL HISTORY: Bradley Goodman 65y.o. male returns to the clinic today for follow-up visit accompanied by his wife and daughter.  The patient is feeling fine today with no concerning complaints.  He denied having any chest pain, shortness of breath, cough or hemoptysis.  He has no nausea, vomiting, diarrhea or constipation.  He denied  having any significant weight loss or night sweats.  He was seen by a chiropractor recently and he has adjustment of his back and felt much better and breathing better.  He is here today for evaluation with repeat CT scan of the chest, abdomen and pelvis for restaging of his disease.  MEDICAL HISTORY: Past Medical History:  Diagnosis Date   Cervical spondylolysis    Essential hypertension    GERD (gastroesophageal reflux disease)    History of kidney stones    History of migraine    Hyperlipidemia    Hypertension    PONV (postoperative nausea and vomiting)    Type 2 diabetes mellitus (HCC)     ALLERGIES:  has No Known Allergies.  MEDICATIONS:  Current Outpatient Medications  Medication Sig Dispense Refill   cholecalciferol (VITAMIN D) 1000 UNITS tablet Take 1,000 Units by mouth every evening.     Continuous Blood Gluc Sensor (FREESTYLE LIBRE 3 SENSOR) MISC Place 1 sensor on the skin every 14 days. Use to check glucose continuously 2 each 2   dexamethasone (DECADRON) 1 MG tablet Take 2 tablets by mouth daily. 180 tablet 1   docusate sodium (COLACE) 100 MG capsule Take 100 mg by mouth daily.     folic acid (FOLVITE) 1 MG tablet Take 1 tablet by mouth daily. 30 tablet 4   Krill Oil 300 MG CAPS Take 300 mg by mouth every evening.     levothyroxine (SYNTHROID) 50 MCG tablet Take 1 tablet (50 mcg total) by mouth daily before breakfast. 90 tablet 1   loratadine (CLARITIN) 10 MG tablet Take 10 mg by mouth every evening.      losartan (COZAAR) 50 MG tablet Take  1 and 1/2 tablets (75 mg total) by mouth daily. 135 tablet 0   mirtazapine (REMERON) 15 MG tablet Take 1 tablet by mouth at bedtime. 90 tablet 1   Multiple Vitamins-Minerals (MULTIVITAMIN GUMMIES ADULT PO) Take 2 each by mouth daily.     pantoprazole (PROTONIX) 40 MG tablet Take 1 tablet by mouth every evening. 90 tablet 1   PRESCRIPTION MEDICATION Testosterone injection     prochlorperazine (COMPAZINE) 10 MG tablet Take 1 tablet (10  mg total) by mouth every 6 (six) hours as needed for nausea or vomiting. 30 tablet 0   rosuvastatin (CRESTOR) 20 MG tablet Take 1 tablet (20 mg total) by mouth daily. 90 tablet 1   Semaglutide,0.25 or 0.5MG/DOS, (OZEMPIC, 0.25 OR 0.5 MG/DOSE,) 2 MG/3ML SOPN Inject 0.5 mg into the skin once a week. 9 mL 0   SYRINGE-NEEDLE, DISP, 3 ML 21G X 1-1/2" 3 ML MISC Use to inject testosterone every week 100 each 2   testosterone cypionate (DEPOTESTOTERONE CYPIONATE) 100 MG/ML injection Inject 0.5 mLs (50 mg total) into the muscle once a week. For IM use only. 10 mL 0   venlafaxine XR (EFFEXOR-XR) 75 MG 24 hr capsule Take 1 capsule (75 mg total) by mouth daily with breakfast. 90 capsule 1   No current facility-administered medications for this visit.    SURGICAL HISTORY:  Past Surgical History:  Procedure Laterality Date   Bilateral inguinal hernia repair     BRONCHIAL NEEDLE ASPIRATION BIOPSY  04/05/2021   Procedure: BRONCHIAL NEEDLE ASPIRATION BIOPSIES;  Surgeon: Garner Nash, DO;  Location: [facility];  Service: Pulmonary;;   COLONOSCOPY  01/23/2012   Procedure: COLONOSCOPY;  Surgeon: Daneil Dolin, MD;  [facility];  Service: Endoscopy;  Laterality: N/A;  9:30 AM   COLONOSCOPY N/A 10/11/2015   Procedure: COLONOSCOPY;  Surgeon: Daneil Dolin, MD;  [facility];  Service: Endoscopy;  Laterality: N/A;  830   COLONOSCOPY N/A 10/14/2019   Procedure: COLONOSCOPY;  Surgeon: Daneil Dolin, MD;  [facility];  Service: Endoscopy;  Laterality: N/A;  1:45   POLYPECTOMY  10/14/2019   Procedure: POLYPECTOMY;  Surgeon: Daneil Dolin, MD;  [facility];  Service: Endoscopy;;  ascending colon, descending colon   VIDEO BRONCHOSCOPY WITH ENDOBRONCHIAL ULTRASOUND N/A 04/05/2021   Procedure: VIDEO BRONCHOSCOPY WITH ENDOBRONCHIAL ULTRASOUND;  Surgeon: Garner Nash, DO;  Location: [facility];  Service: Pulmonary;  Laterality: N/A;    REVIEW OF SYSTEMS:   Constitutional: negative Eyes: negative Ears, nose, mouth, throat, and face: negative Respiratory: negative Cardiovascular: negative Gastrointestinal: negative Genitourinary:negative Integument/breast: negative Hematologic/lymphatic: negative Musculoskeletal:negative Neurological: negative Behavioral/Psych: negative Endocrine: negative Allergic/Immunologic: negative   PHYSICAL EXAMINATION: General appearance: alert, cooperative, appears stated age, and no distress Head: Normocephalic, without obvious abnormality, atraumatic Neck: no adenopathy, no JVD, supple, symmetrical, trachea midline, and thyroid not enlarged, symmetric, no tenderness/mass/nodules Lymph nodes: Cervical, supraclavicular, and axillary nodes normal. Resp: clear to auscultation bilaterally Back: symmetric, no curvature. ROM normal. No CVA tenderness. Cardio: regular rate and rhythm, S1, S2 normal, no murmur, click, rub or gallop GI: soft, non-tender; bowel sounds normal; no masses,  no organomegaly Extremities: extremities normal, atraumatic, no cyanosis or edema Neurologic: Alert and oriented X 3, normal strength and tone. Normal symmetric reflexes. Normal coordination and gait  ECOG PERFORMANCE STATUS: 1 - Symptomatic but completely ambulatory  Blood pressure (!) 150/83, pulse 99, temperature 98.1 F (36.7 C), temperature source Oral, resp. rate 16, weight 190 lb 14.4  oz (86.6 kg), SpO2 99 %.  LABORATORY DATA: Lab Results  Component Value Date   WBC 11.5 (H) 07/23/2022   HGB 13.7 07/23/2022   HCT 43.7 07/23/2022   MCV 91.4 07/23/2022   PLT 270 07/23/2022      Chemistry      Component Value Date/Time   NA 141 07/02/2022 0935   NA 144 01/10/2021 0849   K 3.7 07/02/2022 0935   CL 107 07/02/2022 0935   CO2 28 07/02/2022 0935   BUN 15 07/02/2022 0935   BUN 15 01/10/2021 0849   CREATININE 1.04 07/02/2022 0935      Component Value Date/Time   CALCIUM 8.9 07/02/2022 0935   ALKPHOS 60 07/02/2022 0935    AST 25 07/02/2022 0935   ALT 28 07/02/2022 0935   BILITOT 0.3 07/02/2022 0935       RADIOGRAPHIC STUDIES: CT Chest W Contrast  Result Date: 07/22/2022 CLINICAL DATA:  Non-small cell lung cancer. Restaging. * Tracking Code: BO * EXAM: CT CHEST, ABDOMEN, AND PELVIS WITH CONTRAST TECHNIQUE: Multidetector CT imaging of the chest, abdomen and pelvis was performed following the standard protocol during bolus administration of intravenous contrast. RADIATION DOSE REDUCTION: This exam was performed according to the departmental dose-optimization program which includes automated exposure control, adjustment of the mA and/or kV according to patient size and/or use of iterative reconstruction technique. CONTRAST:  128m OMNIPAQUE IOHEXOL 300 MG/ML  SOLN COMPARISON:  Multiple prior studies. The most recent is 04/27/2022 FINDINGS: CT CHEST FINDINGS Cardiovascular: The heart is normal in size. Small amount of pericardial fluid but no overt effusion. The aorta is normal in caliber. No dissection. Stable scattered coronary artery calcifications. Mediastinum/Nodes: No mediastinal or hilar mass or lymphadenopathy. The esophagus is unremarkable. Lungs/Pleura: Stable underlying emphysematous changes and areas of pulmonary scarring. Stable small scattered pulmonary nodules. No new or progressive findings. Largest right lung nodule is in the right upper lobe and measures 7 mm on image 41/505. No change. The largest nodule in the left lung is a ground-glass nodule which measures 7 mm on image 78/505. This is also stable. Stable appearing medial right middle lobe atelectasis or post pneumonic scarring. No pleural effusions or pleural lesions. Musculoskeletal: Stable cystic lesion in the T6 vertebral body. No change since 2018. No worrisome bone lesions. CT ABDOMEN PELVIS FINDINGS Hepatobiliary: No worrisome hepatic lesions or intrahepatic biliary dilatation. Stable small hepatic cysts. The gallbladder is unremarkable. No  common bile duct dilatation. Pancreas: No mass, inflammation or ductal dilatation. Spleen: Normal size. No focal lesions. Adrenals/Urinary Tract: The adrenal glands are normal. Stable 9 mm left renal calculus. No renal lesions or hydronephrosis. The bladder is unremarkable. Stomach/Bowel: The stomach, duodenum, small and colon are. No acute inflammatory process, mass lesions or obstructive findings. The terminal ileum and appendix are normal. Scattered colonic diverticulosis without findings for acute diverticulitis. Vascular/Lymphatic: Stable age advanced atherosclerotic calcifications involving the aorta and branch vessels but no aneurysm dissection. The major venous structures are patent. No mesenteric or retroperitoneal mass or adenopathy. Reproductive: The prostate gland and seminal vesicles are unremarkable. Other: No pelvic mass or adenopathy. No free pelvic fluid collections. No inguinal mass or adenopathy. No abdominal wall hernia or subcutaneous lesions. Musculoskeletal: No significant bony findings. IMPRESSION: 1. Stable CT appearance of the chest, abdomen and pelvis. No findings for recurrent or metastatic disease. 2. Stable emphysematous changes and pulmonary scarring. 3. Stable scattered pulmonary nodules. 4. Stable 9 mm left renal calculus. 5. Stable age advanced atherosclerotic calcifications involving the aorta  and branch vessels. 6. Aortic atherosclerosis. Aortic Atherosclerosis (ICD10-I70.0) and Emphysema (ICD10-J43.9). Electronically Signed   By: Marijo Sanes M.D.   On: 07/22/2022 12:06   CT Abdomen Pelvis W Contrast  Result Date: 07/22/2022 CLINICAL DATA:  Non-small cell lung cancer. Restaging. * Tracking Code: BO * EXAM: CT CHEST, ABDOMEN, AND PELVIS WITH CONTRAST TECHNIQUE: Multidetector CT imaging of the chest, abdomen and pelvis was performed following the standard protocol during bolus administration of intravenous contrast. RADIATION DOSE REDUCTION: This exam was performed according  to the departmental dose-optimization program which includes automated exposure control, adjustment of the mA and/or kV according to patient size and/or use of iterative reconstruction technique. CONTRAST:  126m OMNIPAQUE IOHEXOL 300 MG/ML  SOLN COMPARISON:  Multiple prior studies. The most recent is 04/27/2022 FINDINGS: CT CHEST FINDINGS Cardiovascular: The heart is normal in size. Small amount of pericardial fluid but no overt effusion. The aorta is normal in caliber. No dissection. Stable scattered coronary artery calcifications. Mediastinum/Nodes: No mediastinal or hilar mass or lymphadenopathy. The esophagus is unremarkable. Lungs/Pleura: Stable underlying emphysematous changes and areas of pulmonary scarring. Stable small scattered pulmonary nodules. No new or progressive findings. Largest right lung nodule is in the right upper lobe and measures 7 mm on image 41/505. No change. The largest nodule in the left lung is a ground-glass nodule which measures 7 mm on image 78/505. This is also stable. Stable appearing medial right middle lobe atelectasis or post pneumonic scarring. No pleural effusions or pleural lesions. Musculoskeletal: Stable cystic lesion in the T6 vertebral body. No change since 2018. No worrisome bone lesions. CT ABDOMEN PELVIS FINDINGS Hepatobiliary: No worrisome hepatic lesions or intrahepatic biliary dilatation. Stable small hepatic cysts. The gallbladder is unremarkable. No common bile duct dilatation. Pancreas: No mass, inflammation or ductal dilatation. Spleen: Normal size. No focal lesions. Adrenals/Urinary Tract: The adrenal glands are normal. Stable 9 mm left renal calculus. No renal lesions or hydronephrosis. The bladder is unremarkable. Stomach/Bowel: The stomach, duodenum, small and colon are. No acute inflammatory process, mass lesions or obstructive findings. The terminal ileum and appendix are normal. Scattered colonic diverticulosis without findings for acute diverticulitis.  Vascular/Lymphatic: Stable age advanced atherosclerotic calcifications involving the aorta and branch vessels but no aneurysm dissection. The major venous structures are patent. No mesenteric or retroperitoneal mass or adenopathy. Reproductive: The prostate gland and seminal vesicles are unremarkable. Other: No pelvic mass or adenopathy. No free pelvic fluid collections. No inguinal mass or adenopathy. No abdominal wall hernia or subcutaneous lesions. Musculoskeletal: No significant bony findings. IMPRESSION: 1. Stable CT appearance of the chest, abdomen and pelvis. No findings for recurrent or metastatic disease. 2. Stable emphysematous changes and pulmonary scarring. 3. Stable scattered pulmonary nodules. 4. Stable 9 mm left renal calculus. 5. Stable age advanced atherosclerotic calcifications involving the aorta and branch vessels. 6. Aortic atherosclerosis. Aortic Atherosclerosis (ICD10-I70.0) and Emphysema (ICD10-J43.9). Electronically Signed   By: PMarijo SanesM.D.   On: 07/22/2022 12:06    ASSESSMENT AND PLAN: This is a very pleasant 66years old white male recently diagnosed with stage IV (T1b, N3, M1 C) non-small cell lung cancer favoring adenocarcinoma presented with right upper lobe lung nodule in addition to right hilar, subcarinal and bilateral mediastinal as well as supraclavicular lymphadenopathy.  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  His PD-L1 expression is 80% and his molecular studies showed KRAS G12C mutation. The patient underwent SRS to metastatic brain lesion under the care of Dr. MTammi Klippeland  he is currently undergoing systemic chemotherapy with carboplatin for AUC of 5, Alimta 500 Mg/M2 and Keytruda 200 Mg IV every 3 weeks status post 21 cycles.  Starting from cycle #5 the patient will be treated with maintenance treatment with Alimta and Keytruda every 3 weeks. The patient has been tolerating this treatment well with no concerning adverse effects. He  had repeat CT scan of the chest, abdomen and pelvis performed recently.  I personally and independently reviewed the scan and discussed the result with the patient and his wife and daughter. His scan showed no concerning findings for disease progression. I recommended for him to continue his current treatment with maintenance Alimta and Keytruda with the same dose. I will see him back for follow-up visit in 3 weeks for evaluation before the next cycle of his treatment. The patient was advised to call immediately if he has any other concerning symptoms in the interval. The patient voices understanding of current disease status and treatment options and is in agreement with the current care plan.  All questions were answered. The patient knows to call the clinic with any problems, questions or concerns. We can certainly see the patient much sooner if necessary. The total time spent in the appointment was 30 minutes.  Disclaimer: This note was dictated with voice recognition software. Similar sounding words can inadvertently be transcribed and may not be corrected upon review.

## 2022-07-23 NOTE — Patient Instructions (Signed)
Steps to Quit Smoking Smoking tobacco is the leading cause of preventable death. It can affect almost every organ in the body. Smoking puts you and people around you at risk for many serious, long-lasting (chronic) diseases. Quitting smoking can be hard, but it is one of the best things that you can do for your health. It is never too late to quit. Do not give up if you cannot quit the first time. Some people need to try many times to quit. Do your best to stick to your quit plan, and talk with your doctor if you have any questions or concerns. How do I get ready to quit? Pick a date to quit. Set a date within the next 2 weeks to give you time to prepare. Write down the reasons why you are quitting. Keep this list in places where you will see it often. Tell your family, friends, and co-workers that you are quitting. Their support is important. Talk with your doctor about the choices that may help you quit. Find out if your health insurance will pay for these treatments. Know the people, places, things, and activities that make you want to smoke (triggers). Avoid them. What first steps can I take to quit smoking? Throw away all cigarettes at home, at work, and in your car. Throw away the things that you use when you smoke, such as ashtrays and lighters. Clean your car. Empty the ashtray. Clean your home, including curtains and carpets. What can I do to help me quit smoking? Talk with your doctor about taking medicines and seeing a counselor. You are more likely to succeed when you do both. If you are pregnant or breastfeeding: Talk with your doctor about counseling or other ways to quit smoking. Do not take medicine to help you quit smoking unless your doctor tells you to. Quit right away Quit smoking completely, instead of slowly cutting back on how much you smoke over a period of time. Stopping smoking right away may be more successful than slowly quitting. Go to counseling. In-person is best  if this is an option. You are more likely to quit if you go to counseling sessions regularly. Take medicine You may take medicines to help you quit. Some medicines need a prescription, and some you can buy over-the-counter. Some medicines may contain a drug called nicotine to replace the nicotine in cigarettes. Medicines may: Help you stop having the desire to smoke (cravings). Help to stop the problems that come when you stop smoking (withdrawal symptoms). Your doctor may ask you to use: Nicotine patches, gum, or lozenges. Nicotine inhalers or sprays. Non-nicotine medicine that you take by mouth. Find resources Find resources and other ways to help you quit smoking and remain smoke-free after you quit. They include: Online chats with a counselor. Phone quitlines. Printed self-help materials. Support groups or group counseling. Text messaging programs. Mobile phone apps. Use apps on your mobile phone or tablet that can help you stick to your quit plan. Examples of free services include Quit Guide from the CDC and smokefree.gov  What can I do to make it easier to quit?  Talk to your family and friends. Ask them to support and encourage you. Call a phone quitline, such as 1-800-QUIT-NOW, reach out to support groups, or work with a counselor. Ask people who smoke to not smoke around you. Avoid places that make you want to smoke, such as: Bars. Parties. Smoke-break areas at work. Spend time with people who do not smoke. Lower   the stress in your life. Stress can make you want to smoke. Try these things to lower stress: Getting regular exercise. Doing deep-breathing exercises. Doing yoga. Meditating. What benefits will I see if I quit smoking? Over time, you may have: A better sense of smell and taste. Less coughing and sore throat. A slower heart rate. Lower blood pressure. Clearer skin. Better breathing. Fewer sick days. Summary Quitting smoking can be hard, but it is one of  the best things that you can do for your health. Do not give up if you cannot quit the first time. Some people need to try many times to quit. When you decide to quit smoking, make a plan to help you succeed. Quit smoking right away, not slowly over a period of time. When you start quitting, get help and support to keep you smoke-free. This information is not intended to replace advice given to you by your health care provider. Make sure you discuss any questions you have with your health care provider. Document Revised: 09/15/2021 Document Reviewed: 09/15/2021 Elsevier Patient Education  2023 [employer].  

## 2022-07-23 NOTE — Patient Instructions (Signed)
[facility]  Discharge Instructions: Thank you for choosing [facility] to provide your oncology and hematology care.   If you have a lab appointment with the [facility], please go directly to the [facility] and check in at the registration area.   Wear comfortable clothing and clothing appropriate for easy access to any Portacath or PICC line.   We strive to give you quality time with your provider. You may need to reschedule your appointment if you arrive late (15 or more minutes).  Arriving late affects you and other patients whose appointments are after yours.  Also, if you miss three or more appointments without notifying the office, you may be dismissed from the clinic at the provider's discretion.      For prescription refill requests, have your pharmacy contact our office and allow 72 hours for refills to be completed.    Today you received the following chemotherapy and/or immunotherapy agents keytruda, alimta      To help prevent nausea and vomiting after your treatment, we encourage you to take your nausea medication as directed.  BELOW ARE SYMPTOMS THAT SHOULD BE REPORTED IMMEDIATELY: *FEVER GREATER THAN 100.4 F (38 C) OR HIGHER *CHILLS OR SWEATING *NAUSEA AND VOMITING THAT IS NOT CONTROLLED WITH YOUR NAUSEA MEDICATION *UNUSUAL SHORTNESS OF BREATH *UNUSUAL BRUISING OR BLEEDING *URINARY PROBLEMS (pain or burning when urinating, or frequent urination) *BOWEL PROBLEMS (unusual diarrhea, constipation, pain near the anus) TENDERNESS IN MOUTH AND THROAT WITH OR WITHOUT PRESENCE OF ULCERS (sore throat, sores in mouth, or a toothache) UNUSUAL RASH, SWELLING OR PAIN  UNUSUAL VAGINAL DISCHARGE OR ITCHING   Items with * indicate a potential emergency and should be followed up as soon as possible or go to the Emergency Department if any problems should occur.  Please show the CHEMOTHERAPY ALERT CARD or IMMUNOTHERAPY ALERT CARD at  check-in to the Emergency Department and triage nurse.  Should you have questions after your visit or need to cancel or reschedule your appointment, please contact [facility]  Dept: [phone]  and follow the prompts.  Office hours are 8:00 a.m. to 4:30 p.m. Monday - Friday. Please note that voicemails left after 4:00 p.m. may not be returned until the following business day.  We are closed weekends and major holidays. You have access to a nurse at all times for urgent questions. Please call the main number to the clinic Dept: [phone] and follow the prompts.   For any non-urgent questions, you may also contact your provider using MyChart. We now offer e-Visits for anyone 8 and older to request care online for non-urgent symptoms. For details visit [email or web address].   Also download the MyChart app! Go to the app store, search "MyChart", open the app, select [facility], and log in with your MyChart username and password.  Masks are optional in the cancer centers. If you would like for your care team to wear a mask while they are taking care of you, please let them know. You may have one support person who is at least 65 years old accompany you for your appointments.

## 2022-07-24 ENCOUNTER — Ambulatory Visit: Payer: 59 | Admitting: Family Medicine

## 2022-07-24 LAB — T4: T4, Total: 7.4 ug/dL (ref 4.5–12.0)

## 2022-07-31 ENCOUNTER — Encounter: Payer: Self-pay | Admitting: Family Medicine

## 2022-07-31 ENCOUNTER — Other Ambulatory Visit (HOSPITAL_COMMUNITY): Payer: Self-pay

## 2022-07-31 ENCOUNTER — Ambulatory Visit: Payer: 59 | Admitting: Family Medicine

## 2022-07-31 ENCOUNTER — Telehealth: Payer: Self-pay | Admitting: Internal Medicine

## 2022-07-31 VITALS — BP 149/83 | HR 102 | Temp 97.5°F | Ht 71.5 in | Wt 193.0 lb

## 2022-07-31 DIAGNOSIS — E1169 Type 2 diabetes mellitus with other specified complication: Secondary | ICD-10-CM | POA: Diagnosis not present

## 2022-07-31 DIAGNOSIS — R630 Anorexia: Secondary | ICD-10-CM

## 2022-07-31 DIAGNOSIS — E785 Hyperlipidemia, unspecified: Secondary | ICD-10-CM

## 2022-07-31 DIAGNOSIS — E1165 Type 2 diabetes mellitus with hyperglycemia: Secondary | ICD-10-CM | POA: Diagnosis not present

## 2022-07-31 DIAGNOSIS — E1159 Type 2 diabetes mellitus with other circulatory complications: Secondary | ICD-10-CM

## 2022-07-31 DIAGNOSIS — F3340 Major depressive disorder, recurrent, in remission, unspecified: Secondary | ICD-10-CM

## 2022-07-31 DIAGNOSIS — C3491 Malignant neoplasm of unspecified part of right bronchus or lung: Secondary | ICD-10-CM

## 2022-07-31 DIAGNOSIS — K219 Gastro-esophageal reflux disease without esophagitis: Secondary | ICD-10-CM

## 2022-07-31 DIAGNOSIS — I152 Hypertension secondary to endocrine disorders: Secondary | ICD-10-CM

## 2022-07-31 DIAGNOSIS — G479 Sleep disorder, unspecified: Secondary | ICD-10-CM | POA: Diagnosis not present

## 2022-07-31 DIAGNOSIS — E782 Mixed hyperlipidemia: Secondary | ICD-10-CM | POA: Diagnosis not present

## 2022-07-31 LAB — BAYER DCA HB A1C WAIVED: HB A1C (BAYER DCA - WAIVED): 6.6 % — ABNORMAL HIGH (ref 4.8–5.6)

## 2022-07-31 MED ORDER — PANTOPRAZOLE SODIUM 40 MG PO TBEC
40.0000 mg | DELAYED_RELEASE_TABLET | Freq: Every evening | ORAL | 1 refills | Status: DC
  Filled 2022-07-31: qty 90, 90d supply, fill #0
  Filled 2022-10-30: qty 90, 90d supply, fill #1

## 2022-07-31 MED ORDER — MIRTAZAPINE 15 MG PO TABS
15.0000 mg | ORAL_TABLET | Freq: Every day | ORAL | 1 refills | Status: DC
  Filled 2022-07-31: qty 90, 90d supply, fill #0
  Filled 2022-10-30: qty 90, 90d supply, fill #1

## 2022-07-31 MED ORDER — LOSARTAN POTASSIUM 50 MG PO TABS
75.0000 mg | ORAL_TABLET | Freq: Every day | ORAL | 1 refills | Status: DC
  Filled 2022-07-31 – 2022-08-20 (×2): qty 135, 90d supply, fill #0
  Filled 2022-11-26: qty 135, 90d supply, fill #1

## 2022-07-31 MED ORDER — VENLAFAXINE HCL ER 75 MG PO CP24
75.0000 mg | ORAL_CAPSULE | Freq: Every day | ORAL | 1 refills | Status: DC
  Filled 2022-07-31 – 2022-10-17 (×2): qty 90, 90d supply, fill #0
  Filled 2023-01-22: qty 90, 90d supply, fill #1

## 2022-07-31 MED ORDER — FOLIC ACID 1 MG PO TABS
1.0000 mg | ORAL_TABLET | Freq: Every day | ORAL | 4 refills | Status: DC
  Filled 2022-07-31: qty 90, 90d supply, fill #0
  Filled 2022-10-30: qty 90, 90d supply, fill #1
  Filled 2023-02-07: qty 90, 90d supply, fill #2
  Filled 2023-05-05: qty 90, 90d supply, fill #3

## 2022-07-31 MED ORDER — ROSUVASTATIN CALCIUM 20 MG PO TABS
20.0000 mg | ORAL_TABLET | Freq: Every day | ORAL | 1 refills | Status: DC
  Filled 2022-07-31 – 2022-10-17 (×2): qty 90, 90d supply, fill #0
  Filled 2023-01-22: qty 90, 90d supply, fill #1

## 2022-07-31 MED ORDER — OZEMPIC (0.25 OR 0.5 MG/DOSE) 2 MG/3ML ~~LOC~~ SOPN
0.5000 mg | PEN_INJECTOR | SUBCUTANEOUS | 0 refills | Status: DC
  Filled 2022-07-31: qty 9, 84d supply, fill #0
  Filled 2022-08-10: qty 3, 28d supply, fill #0
  Filled 2022-09-17: qty 3, 28d supply, fill #1
  Filled 2022-10-11: qty 3, 28d supply, fill #2

## 2022-07-31 NOTE — Progress Notes (Signed)
Subjective:  Patient ID: Bradley Goodman, male    DOB: 1956/02/23, 66 y.o.   MRN: [id number]  Patient Care Team: [name], FNP as PCP - General (Family Medicine) Gala Romney, Cristopher Estimable, MD as Consulting Physician (Gastroenterology) Celestia Khat, [place] (Optometry)   Chief Complaint:  Medical Management of Chronic Issues, Hyperlipidemia, Hypertension, and Diabetes   HPI: Bradley Goodman is a 66 y.o. male presenting on 07/31/2022 for Medical Management of Chronic Issues, Hyperlipidemia, Hypertension, and Diabetes  Pt presents today to establish care with new PCP as his is no longer at the office.   1. Type 2 diabetes mellitus with hyperglycemia, without long-term current use of insulin ([place]) Currently on Ozempic and tolerating well. Does have some GERD symptoms after dosing but usually resolve quickly. Does have intermittent diarrhea with dosing also. Nothing sustained. Is on ARB, statin, and ASA daily. No polyuria, polyphagia, or polydipsia.   2. Hyperlipidemia associated with diabetes ([place]) On statin therapy and tolerating well. Denies myalgias. Does try to follow a healthier diet. No regular exercise.   3. Hypertension associated with type 2 diabetes mellitus ([place]) Compliant with medications. Denies headaches, chest pain, leg swelling, dizziness, weakness, confusion, or visual changes.   4. Difficulty sleeping 5. Decreased appetite 6. Recurrent major depression in remission [facility]) Reports improvement in symptoms since last visit. Denies SI or HI. Sleep is still hit or miss but improving.     07/31/2022    8:48 AM 07/31/2022    8:47 AM 04/17/2022    9:41 AM 01/10/2022    8:11 AM 11/16/2021    8:23 AM  Depression screen PHQ 2/9  Decreased Interest  1 0 0 0  Down, Depressed, Hopeless  0 0 1 0  PHQ - 2 Score  1 0 1 0  Altered sleeping 0  2 0 1  Tired, decreased energy 1  0 1 0  Change in appetite 0  0 0 1  Feeling bad or failure about yourself  0  0 0 0  Trouble concentrating 0  0  0 0  Moving slowly or fidgety/restless 0  2 0 0  Suicidal thoughts 0  0 0 0  PHQ-9 Score   4 2 2   Difficult doing work/chores Not difficult at all  Not difficult at all Not difficult at all Not difficult at all      07/31/2022    8:47 AM 04/17/2022    9:41 AM 01/10/2022    8:12 AM 11/16/2021    8:23 AM  GAD 7 : Generalized Anxiety Score  Nervous, Anxious, on Edge 0 0 0 0  Control/stop worrying 0 0 0 0  Worry too much - different things 0 0 0 0  Trouble relaxing 0 0 0 0  Restless 0 0 0 0  Easily annoyed or irritable 1 1 1  0  Afraid - awful might happen 0 0 0 0  Total GAD 7 Score 1 1 1  0  Anxiety Difficulty Not difficult at all Not difficult at all Not difficult at all Not difficult at all    7. Gastroesophageal reflux disease without esophagitis On PPI therapy and tolerating well. Denies hemoptysis, melena, or hematochezia. No abdominal pain. Does have some reflux symptoms after weekly dosing of Ozempic, not persistent.   8. Adenocarcinoma of right lung, stage 4 ([place]) Followed by oncology on a regular basis. Reports he feels well overall. Has cut back on smoking but has not stopped completely.  Relevant past medical, surgical, family, and social history reviewed and updated as indicated.  Allergies and medications reviewed and updated. Data reviewed: Chart in Epic.   Past Medical History:  Diagnosis Date   Cervical spondylolysis    Essential hypertension    GERD (gastroesophageal reflux disease)    History of kidney stones    History of migraine    Hyperlipidemia    Hypertension    PONV (postoperative nausea and vomiting)    Type 2 diabetes mellitus ([place])     Past Surgical History:  Procedure Laterality Date   Bilateral inguinal hernia repair     BRONCHIAL NEEDLE ASPIRATION BIOPSY  04/05/2021   Procedure: BRONCHIAL NEEDLE ASPIRATION BIOPSIES;  Surgeon: Garner Nash, DO;  Location: [place];  Service: Pulmonary;;   COLONOSCOPY  01/23/2012   Procedure:  COLONOSCOPY;  Surgeon: Daneil Dolin, MD;  [facility];  Service: Endoscopy;  Laterality: N/A;  9:30 AM   COLONOSCOPY N/A 10/11/2015   Procedure: COLONOSCOPY;  Surgeon: Daneil Dolin, MD;  [facility];  Service: Endoscopy;  Laterality: N/A;  830   COLONOSCOPY N/A 10/14/2019   Procedure: COLONOSCOPY;  Surgeon: Daneil Dolin, MD;  [facility];  Service: Endoscopy;  Laterality: N/A;  1:45   POLYPECTOMY  10/14/2019   Procedure: POLYPECTOMY;  Surgeon: Daneil Dolin, MD;  [facility];  Service: Endoscopy;;  ascending colon, descending colon   VIDEO BRONCHOSCOPY WITH ENDOBRONCHIAL ULTRASOUND N/A 04/05/2021   Procedure: VIDEO BRONCHOSCOPY WITH ENDOBRONCHIAL ULTRASOUND;  Surgeon: Garner Nash, DO;  Location: [place];  Service: Pulmonary;  Laterality: N/A;    Social History   Socioeconomic History   Marital status: Married    Spouse name: Not on file   Number of children: Not on file   Years of education: Not on file   Highest education level: Not on file  Occupational History   Not on file  Tobacco Use   Smoking status: Every Day    Packs/day: 0.50    Years: 30.00    Total pack years: 15.00    Types: Cigarettes    Start date: 10/30/1973   Smokeless tobacco: Never  Vaping Use   Vaping Use: Never used  Substance and Sexual Activity   Alcohol use: Yes    Alcohol/week: 0.0 standard drinks of alcohol    Comment: One drink every 6 months.   Drug use: No   Sexual activity: Yes  Other Topics Concern   Not on file  Social History Narrative   Not on file   Social Determinants of Health   Financial Resource Strain: Not on file  Food Insecurity: Not on file  Transportation Needs: Not on file  Physical Activity: Not on file  Stress: Not on file  Social Connections: Not on file  Intimate Partner Violence: Not on file    Outpatient Encounter Medications as of 07/31/2022  Medication Sig   cholecalciferol (VITAMIN D) 1000 UNITS  tablet Take 1,000 Units by mouth every evening.   Continuous Blood Gluc Sensor (FREESTYLE LIBRE 3 SENSOR) MISC Place 1 sensor on the skin every 14 days. Use to check glucose continuously   dexamethasone (DECADRON) 1 MG tablet Take 2 tablets by mouth daily.   docusate sodium (COLACE) 100 MG capsule Take 100 mg by mouth daily.   Krill Oil 300 MG CAPS Take 300 mg by mouth every evening.   levothyroxine (SYNTHROID) 50 MCG tablet Take 1 tablet (50 mcg total) by mouth  daily before breakfast.   loratadine (CLARITIN) 10 MG tablet Take 10 mg by mouth every evening.    Multiple Vitamins-Minerals (MULTIVITAMIN GUMMIES ADULT PO) Take 2 each by mouth daily.   PRESCRIPTION MEDICATION Testosterone injection   prochlorperazine (COMPAZINE) 10 MG tablet Take 1 tablet (10 mg total) by mouth every 6 (six) hours as needed for nausea or vomiting.   SYRINGE-NEEDLE, DISP, 3 ML 21G X 1-1/2" 3 ML MISC Use to inject testosterone every week   testosterone cypionate (DEPOTESTOTERONE CYPIONATE) 100 MG/ML injection Inject 0.5 mLs (50 mg total) into the muscle once a week. For IM use only.   [DISCONTINUED] folic acid (FOLVITE) 1 MG tablet Take 1 tablet by mouth daily.   [DISCONTINUED] losartan (COZAAR) 50 MG tablet Take 1 and 1/2 tablets (75 mg total) by mouth daily.   [DISCONTINUED] mirtazapine (REMERON) 15 MG tablet Take 1 tablet by mouth at bedtime.   [DISCONTINUED] pantoprazole (PROTONIX) 40 MG tablet Take 1 tablet by mouth every evening.   [DISCONTINUED] rosuvastatin (CRESTOR) 20 MG tablet Take 1 tablet (20 mg total) by mouth daily.   [DISCONTINUED] Semaglutide,0.25 or 0.5MG /DOS, (OZEMPIC, 0.25 OR 0.5 MG/DOSE,) 2 MG/3ML SOPN Inject 0.5 mg into the skin once a week.   [DISCONTINUED] venlafaxine XR (EFFEXOR-XR) 75 MG 24 hr capsule Take 1 capsule (75 mg total) by mouth daily with breakfast.   folic acid (FOLVITE) 1 MG tablet Take 1 tablet by mouth daily.   losartan (COZAAR) 50 MG tablet Take 1 and 1/2 tablets (75 mg total) by  mouth daily.   mirtazapine (REMERON) 15 MG tablet Take 1 tablet by mouth at bedtime.   pantoprazole (PROTONIX) 40 MG tablet Take 1 tablet by mouth every evening.   rosuvastatin (CRESTOR) 20 MG tablet Take 1 tablet (20 mg total) by mouth daily.   Semaglutide,0.25 or 0.5MG /DOS, (OZEMPIC, 0.25 OR 0.5 MG/DOSE,) 2 MG/3ML SOPN Inject 0.5 mg into the skin once a week.   venlafaxine XR (EFFEXOR-XR) 75 MG 24 hr capsule Take 1 capsule (75 mg total) by mouth daily with breakfast.   No facility-administered encounter medications on file as of 07/31/2022.    No Known Allergies  Review of Systems  Constitutional:  Negative for activity change, appetite change, chills, diaphoresis, fatigue, fever and unexpected weight change.  HENT: Negative.    Eyes: Negative.  Negative for photophobia and visual disturbance.  Respiratory:  Negative for cough, chest tightness and shortness of breath.   Cardiovascular:  Negative for chest pain, palpitations and leg swelling.  Gastrointestinal:  Negative for abdominal pain, blood in stool, constipation, diarrhea, nausea and vomiting.  Endocrine: Negative.   Genitourinary:  Negative for decreased urine volume, difficulty urinating, dysuria, frequency and urgency.  Musculoskeletal:  Negative for arthralgias and myalgias.  Skin: Negative.   Allergic/Immunologic: Negative.   Neurological:  Negative for dizziness, tremors, seizures, syncope, facial asymmetry, speech difficulty, weakness, light-headedness, numbness and headaches.  Hematological: Negative.   Psychiatric/Behavioral:  Positive for agitation and sleep disturbance. Negative for behavioral problems, confusion, decreased concentration, dysphoric mood, hallucinations, self-injury and suicidal ideas. The patient is not nervous/anxious and is not hyperactive.   All other systems reviewed and are negative.       Objective:  BP (!) 149/83   Pulse (!) 102   Temp (!) 97.5 F (36.4 C)   Ht 5' 11.5" (1.816 m)   Wt  193 lb (87.5 kg)   SpO2 95%   BMI 26.54 kg/m    Wt Readings from Last 3 Encounters:  07/31/22  193 lb (87.5 kg)  07/23/22 190 lb 14.4 oz (86.6 kg)  07/02/22 189 lb 14.4 oz (86.1 kg)    Physical Exam Vitals and nursing note reviewed.  Constitutional:      General: He is not in acute distress.    Appearance: Normal appearance. He is well-developed and well-groomed. He is not ill-appearing, toxic-appearing or diaphoretic.  HENT:     Head: Normocephalic and atraumatic.     Jaw: There is normal jaw occlusion.     Right Ear: Hearing normal.     Left Ear: Hearing normal.     Nose: Nose normal.     Mouth/Throat:     Lips: Pink.     Mouth: Mucous membranes are moist.     Pharynx: Uvula midline.  Eyes:     General: Lids are normal.     Conjunctiva/sclera: Conjunctivae normal.     Pupils: Pupils are equal, round, and reactive to light.  Neck:     Thyroid: No thyroid mass, thyromegaly or thyroid tenderness.     Vascular: No JVD.     Trachea: Trachea and phonation normal.  Cardiovascular:     Rate and Rhythm: Normal rate and regular rhythm.     Chest Wall: PMI is not displaced.     Pulses: Normal pulses.     Heart sounds: Normal heart sounds. No murmur heard.    No friction rub. No gallop.  Pulmonary:     Effort: Pulmonary effort is normal.     Breath sounds: Decreased breath sounds present.  Abdominal:     General: Bowel sounds are normal. There is no distension or abdominal bruit.     Palpations: Abdomen is soft. There is no hepatomegaly or splenomegaly.     Tenderness: There is no abdominal tenderness. There is no right CVA tenderness or left CVA tenderness.     Hernia: No hernia is present.  Musculoskeletal:        General: Normal range of motion.     Cervical back: Neck supple.     Right lower leg: No edema.     Left lower leg: No edema.  Skin:    General: Skin is warm and dry.     Capillary Refill: Capillary refill takes less than 2 seconds.     Coloration: Skin is not  cyanotic, jaundiced or pale.     Findings: No rash.  Neurological:     General: No focal deficit present.     Mental Status: He is alert and oriented to person, place, and time.     Sensory: Sensation is intact.     Motor: Motor function is intact.     Coordination: Coordination is intact.     Gait: Gait is intact.     Deep Tendon Reflexes: Reflexes are normal and symmetric.  Psychiatric:        Attention and Perception: Attention and perception normal.        Mood and Affect: Mood and affect normal.        Speech: Speech normal.        Behavior: Behavior normal. Behavior is cooperative.        Thought Content: Thought content normal.        Cognition and Memory: Cognition and memory normal.        Judgment: Judgment normal.     Results for orders placed or performed in visit on 07/31/22  Bayer DCA Hb A1c Waived  Result Value Ref Range   HB A1C ([facility]  [place] - WAIVED) 6.6 (H) 4.8 - 5.6 %       Pertinent labs & imaging results that were available during my care of the patient were reviewed by me and considered in my medical decision making.  Assessment & Plan:  [place] was seen today for medical management of chronic issues, hyperlipidemia, hypertension and diabetes.  Diagnoses and all orders for this visit:  Type 2 diabetes mellitus with hyperglycemia, without long-term current use of insulin (H[facility]) A1C 6.6 today, well controlled on current regimen, will continue. Diet and exercise encouraged.  -     Lipid panel -     Bayer DCA Hb A1c Waived -     Semaglutide,0.25 or 0.5MG /DOS, (OZEMPIC, 0.25 OR 0.5 MG/DOSE,) 2 MG/3ML SOPN; Inject 0.5 mg into the skin once a week.  Hyperlipidemia associated with diabetes ([place]) Diet encouraged - increase intake of fresh fruits and vegetables, increase intake of lean proteins. Bake, broil, or grill foods. Avoid fried, greasy, and fatty foods. Avoid fast foods. Increase intake of fiber-rich whole grains. Exercise encouraged - at least 150 minutes  per week and advance as tolerated. Continue medications as prescribed. Follow up in 3-6 months as discussed.  -     Lipid panel -     Bayer DCA Hb A1c Waived -     rosuvastatin (CRESTOR) 20 MG tablet; Take 1 tablet (20 mg total) by mouth daily.  Hypertension associated with type 2 diabetes mellitus (HCC) BP fairly controlled. Changes were not made in regimen today. Goal BP is 130/80. Pt aware to report any persistent high or low readings. DASH diet and exercise encouraged. Exercise at least 150 minutes per week and increase as tolerated. Goal BMI > 25. Stress management encouraged. Avoid nicotine and tobacco product use. Avoid excessive alcohol and NSAID's. Avoid more than 2000 mg of sodium daily. Medications as prescribed. Follow up as scheduled.  -     losartan (COZAAR) 50 MG tablet; Take 1 and 1/2 tablets (75 mg total) by mouth daily.  Difficulty sleeping Decreased appetite Appetite and sleeping has improved with below, will continue.  -     mirtazapine (REMERON) 15 MG tablet; Take 1 tablet by mouth at bedtime. -     folic acid (FOLVITE) 1 MG tablet; Take 1 tablet by mouth daily.  Recurrent major depression in remission [facility]) Doing well with current regimen, will continue.  -     mirtazapine (REMERON) 15 MG tablet; Take 1 tablet by mouth at bedtime. -     venlafaxine XR (EFFEXOR-XR) 75 MG 24 hr capsule; Take 1 capsule (75 mg total) by mouth daily with breakfast.  Gastroesophageal reflux disease without esophagitis No red flags present. Diet discussed. Avoid fried, spicy, fatty, greasy, and acidic foods. Avoid caffeine, nicotine, and alcohol. Do not eat 2-3 hours before bedtime and stay upright for at least 1-2 hours after eating. Eat small frequent meals. Avoid NSAID's like motrin and aleve. Medications as prescribed. Report any new or worsening symptoms. Follow up as discussed or sooner if needed.  -     pantoprazole (PROTONIX) 40 MG tablet; Take 1 tablet by mouth every  evening.  Adenocarcinoma of right lung, stage 4 ([place]) Followed by oncology on a regular basis.     Continue all other maintenance medications.  Follow up plan: Return in about 3 months (around 10/31/2022), or if symptoms worsen or fail to improve, for DM.   Continue healthy lifestyle choices, including diet (rich in fruits, vegetables, and lean proteins, and low  in salt and simple carbohydrates) and exercise (at least 30 minutes of moderate physical activity daily).  Educational handout given for DM  The above assessment and management plan was discussed with the patient. The patient verbalized understanding of and has agreed to the management plan. Patient is aware to call the clinic if they develop any new symptoms or if symptoms persist or worsen. Patient is aware when to return to the clinic for a follow-up visit. Patient educated on when it is appropriate to go to the emergency department.   Monia Pouch, FNP-C [facility] [phone]

## 2022-07-31 NOTE — Patient Instructions (Addendum)

## 2022-07-31 NOTE — Telephone Encounter (Signed)
Called patient regarding upcoming November appointments, left a voicemail.

## 2022-08-01 LAB — LIPID PANEL
Chol/HDL Ratio: 2.9 ratio (ref 0.0–5.0)
Cholesterol, Total: 112 mg/dL (ref 100–199)
HDL: 38 mg/dL — ABNORMAL LOW (ref >39)
LDL Chol Calc (NIH): 40 mg/dL (ref 0–99)
Triglycerides: 214 mg/dL — ABNORMAL HIGH (ref 0–149)
VLDL Cholesterol Cal: 34 mg/dL (ref 5–40)

## 2022-08-09 NOTE — Progress Notes (Unsigned)
[facility] OFFICE PROGRESS NOTE  [name], [place] [address]  DIAGNOSIS: Stage IV (T1b, N3, M1C) non-small cell lung cancer, favoring adenocarcinoma presented with right upper lobe lung nodule in addition to right hilar, subcarinal and bilateral mediastinal as well as supraclavicular lymphadenopathy in addition to bone and brain metastasis diagnosed in June 2022.     PD-L1 expression 80%.     Molecular Studies:  Biomarker Findings Microsatellite status - MS-Stable Tumor Mutational Burden - 6 Muts/Mb Genomic Findings For a complete list of the genes assayed, please refer to the Appendix. KRAS G12C, amplification ATM S470* CCND1 amplification - equivocal? HGF amplification - equivocal? MYC amplification - equivocal? FGF19 amplification - equivocal? FGF3 amplification - equivocal? FGF4 amplification - equivocal? NFKBIA amplification NKX2-1 amplification RAD21 amplification - equivocal? [id number]*26 TERT promoter -124C>T TP53 rearrangement exon 9 7 Disease relevant genes with no reportable alterations: ALK, BRAF, EGFR, ERBB2, MET, RET, ROS1    PRIOR THERAPY: SRS to the metastatic brain lesions under the care of Dr. MTammi Klippel  Last treatment on 05/04/2021.   CURRENT THERAPY:  Palliative systemic chemotherapy with carboplatin for an AUC 5, Alimta 500 mg/m2 and, Keytruda 200 mg IV every 3 weeks.  First dose expected on 05/08/2021.  Status post 22 cycles.  Starting from cycle #5 he is on maintenance treatment with Alimta and Keytruda every 3 weeks.   INTERVAL HISTORY: Bradley Goodman 66y.o. male returns to the clinic today for a follow-up visit accompanied by his wife. The patient is feeling fairly well today without any new concerning complaints. Today, he denies any fever, chills, night sweats, or unexplained weight loss.  Denies any dyspnea, cough, chest pain, or hemoptysis.  Denies any nausea, vomiting, unusual diarrhea, or  constipation. He reports diarrhea with ozempic. The patient reports a long history of migraines, states he has not had any for a while but has some recently. He states this come in bouts of 3's. He states his migraines are typical for him and this was not unusual. He denies any vision changes.  He is here today for evaluation and repeat blood work before undergoing cycle #23.   MEDICAL HISTORY: Past Medical History:  Diagnosis Date   Cervical spondylolysis    Essential hypertension    GERD (gastroesophageal reflux disease)    History of kidney stones    History of migraine    Hyperlipidemia    Hypertension    PONV (postoperative nausea and vomiting)    Type 2 diabetes mellitus (HCC)     ALLERGIES:  has No Known Allergies.  MEDICATIONS:  Current Outpatient Medications  Medication Sig Dispense Refill   cholecalciferol (VITAMIN D) 1000 UNITS tablet Take 1,000 Units by mouth every evening.     Continuous Blood Gluc Sensor (FREESTYLE LIBRE 3 SENSOR) MISC Place 1 sensor on the skin every 14 days. Use to check glucose continuously 2 each 2   dexamethasone (DECADRON) 1 MG tablet Take 2 tablets by mouth daily. 180 tablet 1   docusate sodium (COLACE) 100 MG capsule Take 100 mg by mouth daily.     folic acid (FOLVITE) 1 MG tablet Take 1 tablet by mouth daily. 90 tablet 4   Krill Oil 300 MG CAPS Take 300 mg by mouth every evening.     levothyroxine (SYNTHROID) 50 MCG tablet Take 1 tablet (50 mcg total) by mouth daily before breakfast. 90 tablet 1   loratadine (CLARITIN) 10 MG tablet Take 10 mg by mouth  every evening.      losartan (COZAAR) 50 MG tablet Take 1 and 1/2 tablets (75 mg total) by mouth daily. 135 tablet 1   mirtazapine (REMERON) 15 MG tablet Take 1 tablet by mouth at bedtime. 90 tablet 1   Multiple Vitamins-Minerals (MULTIVITAMIN GUMMIES ADULT PO) Take 2 each by mouth daily.     pantoprazole (PROTONIX) 40 MG tablet Take 1 tablet by mouth every evening. 90 tablet 1   PRESCRIPTION  MEDICATION Testosterone injection     prochlorperazine (COMPAZINE) 10 MG tablet Take 1 tablet (10 mg total) by mouth every 6 (six) hours as needed for nausea or vomiting. 30 tablet 0   rosuvastatin (CRESTOR) 20 MG tablet Take 1 tablet (20 mg total) by mouth daily. 90 tablet 1   Semaglutide,0.25 or 0.5MG/DOS, (OZEMPIC, 0.25 OR 0.5 MG/DOSE,) 2 MG/3ML SOPN Inject 0.5 mg into the skin once a week. 9 mL 0   SYRINGE-NEEDLE, DISP, 3 ML 21G X 1-1/2" 3 ML MISC Use to inject testosterone every week 100 each 2   testosterone cypionate (DEPOTESTOTERONE CYPIONATE) 100 MG/ML injection Inject 0.5 mLs (50 mg total) into the muscle once a week. For IM use only. 10 mL 0   venlafaxine XR (EFFEXOR-XR) 75 MG 24 hr capsule Take 1 capsule (75 mg total) by mouth daily with breakfast. 90 capsule 1   No current facility-administered medications for this visit.   Facility-Administered Medications Ordered in Other Visits  Medication Dose Route Frequency Provider Last Rate Last Admin   0.9 %  sodium chloride infusion   Intravenous Once Curt Bears, MD       pembrolizumab [facility]) 200 mg in sodium chloride 0.9 % 50 mL chemo infusion  200 mg Intravenous Once Curt Bears, MD       PEMEtrexed (ALIMTA) 1,000 mg in sodium chloride 0.9 % 100 mL chemo infusion  500 mg/m2 (Treatment Plan Recorded) Intravenous Once Curt Bears, MD        SURGICAL HISTORY:  Past Surgical History:  Procedure Laterality Date   Bilateral inguinal hernia repair     BRONCHIAL NEEDLE ASPIRATION BIOPSY  04/05/2021   Procedure: BRONCHIAL NEEDLE ASPIRATION BIOPSIES;  Surgeon: Garner Nash, DO;  Location: [facility];  Service: Pulmonary;;   COLONOSCOPY  01/23/2012   Procedure: COLONOSCOPY;  Surgeon: Daneil Dolin, MD;  [facility];  Service: Endoscopy;  Laterality: N/A;  9:30 AM   COLONOSCOPY N/A 10/11/2015   Procedure: COLONOSCOPY;  Surgeon: Daneil Dolin, MD;  [facility];  Service: Endoscopy;  Laterality:  N/A;  830   COLONOSCOPY N/A 10/14/2019   Procedure: COLONOSCOPY;  Surgeon: Daneil Dolin, MD;  [facility];  Service: Endoscopy;  Laterality: N/A;  1:45   POLYPECTOMY  10/14/2019   Procedure: POLYPECTOMY;  Surgeon: Daneil Dolin, MD;  [facility];  Service: Endoscopy;;  ascending colon, descending colon   VIDEO BRONCHOSCOPY WITH ENDOBRONCHIAL ULTRASOUND N/A 04/05/2021   Procedure: VIDEO BRONCHOSCOPY WITH ENDOBRONCHIAL ULTRASOUND;  Surgeon: Garner Nash, DO;  Location: [facility];  Service: Pulmonary;  Laterality: N/A;    REVIEW OF SYSTEMS:   Constitutional: Positive for baseline fatigue. Negative for appetite change, chills, fever and unexpected weight change.  HENT: Negative for mouth sores, nosebleeds, sore throat and trouble swallowing.   Eyes: Negative for eye problems and icterus.  Respiratory: Positive for occasional baseline shortness of breath. Negative for cough, hemoptysis, and wheezing.   Cardiovascular: Negative for chest pain and leg swelling.  Gastrointestinal:  Negative for abdominal pain, diarrhea, constipation, nausea and vomiting.  Genitourinary: Negative for bladder incontinence, difficulty urinating, dysuria, frequency and hematuria.   Musculoskeletal: Negative for back pain, gait problem, neck pain and neck stiffness.  Skin: Negative for itching and rash.  Neurological: Positive for occasional migraines (states nothing out of the ordinary for his baseline). Negative for dizziness, extremity weakness, gait problem, headaches, light-headedness and seizures.  Hematological: Negative for adenopathy. Does not bruise/bleed easily.  Psychiatric/Behavioral: Negative for confusion, depression and sleep disturbance. The patient is not nervous/anxious.      PHYSICAL EXAMINATION:  Blood pressure (!) 134/91, pulse (!) 109, temperature 98.4 F (36.9 C), temperature source Oral, resp. rate 17, weight 194 lb 4.8 oz (88.1 kg), SpO2 98 %.  ECOG PERFORMANCE  STATUS: 1  Physical Exam  Constitutional: Oriented to person, place, and time and well-developed, well-nourished, and in no distress. No distress.  HENT:  Head: Normocephalic and atraumatic.  Mouth/Throat: Oropharynx is clear and moist. No oropharyngeal exudate.  Eyes: Conjunctivae are normal. Right eye exhibits no discharge. Left eye exhibits no discharge. No scleral icterus.  Neck: Normal range of motion. Neck supple.  Cardiovascular: Normal rate, regular rhythm, normal heart sounds and intact distal pulses.   Pulmonary/Chest: Effort normal and breath sounds normal. No respiratory distress. No wheezes. No rales.  Abdominal: Soft. Bowel sounds are normal. Exhibits no distension and no mass. There is no tenderness.  Musculoskeletal: Normal range of motion. Exhibits no edema.  Lymphadenopathy:    No cervical adenopathy.  Neurological: Alert and oriented to person, place, and time. Exhibits normal muscle tone. Gait normal. Coordination normal.  Skin: Skin is warm and dry. No rash noted. Not diaphoretic. No erythema. No pallor.  Psychiatric: Mood, memory and judgment normal.  Vitals reviewed.  LABORATORY DATA: Lab Results  Component Value Date   WBC 10.6 (H) 08/13/2022   HGB 13.4 08/13/2022   HCT 43.1 08/13/2022   MCV 92.1 08/13/2022   PLT 288 08/13/2022      Chemistry      Component Value Date/Time   NA 142 08/13/2022 1028   NA 144 01/10/2021 0849   K 3.7 08/13/2022 1028   CL 106 08/13/2022 1028   CO2 29 08/13/2022 1028   BUN 16 08/13/2022 1028   BUN 15 01/10/2021 0849   CREATININE 1.01 08/13/2022 1028      Component Value Date/Time   CALCIUM 9.4 08/13/2022 1028   ALKPHOS 71 08/13/2022 1028   AST 18 08/13/2022 1028   ALT 25 08/13/2022 1028   BILITOT 0.3 08/13/2022 1028       RADIOGRAPHIC STUDIES:  CT Chest W Contrast  Result Date: 07/22/2022 CLINICAL DATA:  Non-small cell lung cancer. Restaging. * Tracking Code: BO * EXAM: CT CHEST, ABDOMEN, AND PELVIS WITH  CONTRAST TECHNIQUE: Multidetector CT imaging of the chest, abdomen and pelvis was performed following the standard protocol during bolus administration of intravenous contrast. RADIATION DOSE REDUCTION: This exam was performed according to the departmental dose-optimization program which includes automated exposure control, adjustment of the mA and/or kV according to patient size and/or use of iterative reconstruction technique. CONTRAST:  125m OMNIPAQUE IOHEXOL 300 MG/ML  SOLN COMPARISON:  Multiple prior studies. The most recent is 04/27/2022 FINDINGS: CT CHEST FINDINGS Cardiovascular: The heart is normal in size. Small amount of pericardial fluid but no overt effusion. The aorta is normal in caliber. No dissection. Stable scattered coronary artery calcifications. Mediastinum/Nodes: No mediastinal or hilar mass or lymphadenopathy. The esophagus is unremarkable. Lungs/Pleura: Stable underlying  emphysematous changes and areas of pulmonary scarring. Stable small scattered pulmonary nodules. No new or progressive findings. Largest right lung nodule is in the right upper lobe and measures 7 mm on image 41/505. No change. The largest nodule in the left lung is a ground-glass nodule which measures 7 mm on image 78/505. This is also stable. Stable appearing medial right middle lobe atelectasis or post pneumonic scarring. No pleural effusions or pleural lesions. Musculoskeletal: Stable cystic lesion in the T6 vertebral body. No change since 2018. No worrisome bone lesions. CT ABDOMEN PELVIS FINDINGS Hepatobiliary: No worrisome hepatic lesions or intrahepatic biliary dilatation. Stable small hepatic cysts. The gallbladder is unremarkable. No common bile duct dilatation. Pancreas: No mass, inflammation or ductal dilatation. Spleen: Normal size. No focal lesions. Adrenals/Urinary Tract: The adrenal glands are normal. Stable 9 mm left renal calculus. No renal lesions or hydronephrosis. The bladder is unremarkable.  Stomach/Bowel: The stomach, duodenum, small and colon are. No acute inflammatory process, mass lesions or obstructive findings. The terminal ileum and appendix are normal. Scattered colonic diverticulosis without findings for acute diverticulitis. Vascular/Lymphatic: Stable age advanced atherosclerotic calcifications involving the aorta and branch vessels but no aneurysm dissection. The major venous structures are patent. No mesenteric or retroperitoneal mass or adenopathy. Reproductive: The prostate gland and seminal vesicles are unremarkable. Other: No pelvic mass or adenopathy. No free pelvic fluid collections. No inguinal mass or adenopathy. No abdominal wall hernia or subcutaneous lesions. Musculoskeletal: No significant bony findings. IMPRESSION: 1. Stable CT appearance of the chest, abdomen and pelvis. No findings for recurrent or metastatic disease. 2. Stable emphysematous changes and pulmonary scarring. 3. Stable scattered pulmonary nodules. 4. Stable 9 mm left renal calculus. 5. Stable age advanced atherosclerotic calcifications involving the aorta and branch vessels. 6. Aortic atherosclerosis. Aortic Atherosclerosis (ICD10-I70.0) and Emphysema (ICD10-J43.9). Electronically Signed   By: Marijo Sanes M.D.   On: 07/22/2022 12:06   CT Abdomen Pelvis W Contrast  Result Date: 07/22/2022 CLINICAL DATA:  Non-small cell lung cancer. Restaging. * Tracking Code: BO * EXAM: CT CHEST, ABDOMEN, AND PELVIS WITH CONTRAST TECHNIQUE: Multidetector CT imaging of the chest, abdomen and pelvis was performed following the standard protocol during bolus administration of intravenous contrast. RADIATION DOSE REDUCTION: This exam was performed according to the departmental dose-optimization program which includes automated exposure control, adjustment of the mA and/or kV according to patient size and/or use of iterative reconstruction technique. CONTRAST:  170m OMNIPAQUE IOHEXOL 300 MG/ML  SOLN COMPARISON:  Multiple prior  studies. The most recent is 04/27/2022 FINDINGS: CT CHEST FINDINGS Cardiovascular: The heart is normal in size. Small amount of pericardial fluid but no overt effusion. The aorta is normal in caliber. No dissection. Stable scattered coronary artery calcifications. Mediastinum/Nodes: No mediastinal or hilar mass or lymphadenopathy. The esophagus is unremarkable. Lungs/Pleura: Stable underlying emphysematous changes and areas of pulmonary scarring. Stable small scattered pulmonary nodules. No new or progressive findings. Largest right lung nodule is in the right upper lobe and measures 7 mm on image 41/505. No change. The largest nodule in the left lung is a ground-glass nodule which measures 7 mm on image 78/505. This is also stable. Stable appearing medial right middle lobe atelectasis or post pneumonic scarring. No pleural effusions or pleural lesions. Musculoskeletal: Stable cystic lesion in the T6 vertebral body. No change since 2018. No worrisome bone lesions. CT ABDOMEN PELVIS FINDINGS Hepatobiliary: No worrisome hepatic lesions or intrahepatic biliary dilatation. Stable small hepatic cysts. The gallbladder is unremarkable. No common bile duct dilatation. Pancreas: No  mass, inflammation or ductal dilatation. Spleen: Normal size. No focal lesions. Adrenals/Urinary Tract: The adrenal glands are normal. Stable 9 mm left renal calculus. No renal lesions or hydronephrosis. The bladder is unremarkable. Stomach/Bowel: The stomach, duodenum, small and colon are. No acute inflammatory process, mass lesions or obstructive findings. The terminal ileum and appendix are normal. Scattered colonic diverticulosis without findings for acute diverticulitis. Vascular/Lymphatic: Stable age advanced atherosclerotic calcifications involving the aorta and branch vessels but no aneurysm dissection. The major venous structures are patent. No mesenteric or retroperitoneal mass or adenopathy. Reproductive: The prostate gland and seminal  vesicles are unremarkable. Other: No pelvic mass or adenopathy. No free pelvic fluid collections. No inguinal mass or adenopathy. No abdominal wall hernia or subcutaneous lesions. Musculoskeletal: No significant bony findings. IMPRESSION: 1. Stable CT appearance of the chest, abdomen and pelvis. No findings for recurrent or metastatic disease. 2. Stable emphysematous changes and pulmonary scarring. 3. Stable scattered pulmonary nodules. 4. Stable 9 mm left renal calculus. 5. Stable age advanced atherosclerotic calcifications involving the aorta and branch vessels. 6. Aortic atherosclerosis. Aortic Atherosclerosis (ICD10-I70.0) and Emphysema (ICD10-J43.9). Electronically Signed   By: Marijo Sanes M.D.   On: 07/22/2022 12:06     ASSESSMENT/PLAN:  This is a very pleasant 66 year old Caucasian male diagnosed with stage IV (T1b, N3, M1 C) non-small cell lung cancer, favoring adenocarcinoma.  The patient presented with a right upper lobe lung nodule in addition to right hilar, subcarinal, and bilateral mediastinal lymphadenopathy as well as supraclavicular lymphadenopathy.  The patient also has metastatic disease to the brain.  He was diagnosed in June 2022.  His PD-L1 expression is 80%.  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   The patient completed SRS to the brain lesions under the care of Dr. Tammi Klippel.  His last treatment was on 05/04/2021. 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    The patient is currently undergoing systemic chemotherapy with carboplatin for an AUC of 5, Alimta 500 mg per metered squared, Keytruda 200 mg IV every 3 weeks.  Status post 22 cycles. Starting from cycle #5, the patient started maintenance Alimta and Keytruda IV every 3 weeks.    Labs were reviewed. His TSH is pending. Recommend that he proceed with cycle #23 today scheduled.    Advised to monitor his migraines, if they worsen then to notify us. His previous MRI brain was 9/8 and his next is scheduled for 10/19/2022. He states this is not different than his normal headache.   We will see him back for follow-up visit in 3 weeks for evaluation and to review his scan before starting cycle #24  The patient was advised to call immediately if she has any concerning symptoms in the interval. The patient voices understanding of current disease status and treatment options and is in agreement with the current care plan. All questions were answered. The patient knows to call the clinic with any problems, questions or concerns. We can certainly see the patient much sooner if necessary.      Orders Placed This Encounter  Procedures   CBC with Differential ([facility] Only)    Standing Status:   Future    Standing Expiration Date:   08/14/2023   CMP ([facility] only)    Standing Status:   Future    Standing Expiration Date:   08/14/2023   T4  Standing Status:   Future    Standing Expiration Date:   08/14/2023   TSH    Standing Status:   Future    Standing Expiration Date:   08/14/2023   CBC with Differential (Cancer Center Only)    Standing Status:   Future    Standing Expiration Date:   09/04/2023   CMP ([facility] only)    Standing Status:   Future    Standing Expiration Date:   09/04/2023   T4    Standing Status:   Future    Standing Expiration Date:   09/04/2023   TSH    Standing Status:   Future    Standing Expiration Date:   09/04/2023   CBC with Differential (Cancer Center Only)    Standing Status:   Future    Standing Expiration Date:   09/25/2023   CMP ([facility] only)    Standing Status:   Future    Standing Expiration Date:   09/25/2023   T4    Standing Status:   Future    Standing Expiration Date:   09/25/2023   TSH    Standing Status:   Future    Standing Expiration Date:   09/25/2023   CBC with Differential (Cancer Center  Only)    Standing Status:   Future    Standing Expiration Date:   10/16/2023   CMP ([facility] only)    Standing Status:   Future    Standing Expiration Date:   10/16/2023   CBC with Differential ([facility] Only)    Standing Status:   Future    Standing Expiration Date:   11/06/2023   CMP ([facility] only)    Standing Status:   Future    Standing Expiration Date:   11/06/2023   CBC with Differential ([facility] Only)    Standing Status:   Future    Standing Expiration Date:   11/27/2023   CMP ([facility] only)    Standing Status:   Future    Standing Expiration Date:   11/27/2023   T4    Standing Status:   Future    Standing Expiration Date:   11/27/2023   TSH    Standing Status:   Future    Standing Expiration Date:   11/27/2023   CBC with Differential (Cancer Center Only)    Standing Status:   Future    Standing Expiration Date:   12/18/2023   CMP ([facility] only)    Standing Status:   Future    Standing Expiration Date:   12/18/2023     The total time spent in the appointment was 20-29 minutes.   Bradley Fortenberry L Michale Weikel, PA-C 08/13/22

## 2022-08-10 ENCOUNTER — Other Ambulatory Visit (HOSPITAL_COMMUNITY): Payer: Self-pay

## 2022-08-13 ENCOUNTER — Inpatient Hospital Stay: Payer: 59

## 2022-08-13 ENCOUNTER — Other Ambulatory Visit: Payer: Self-pay

## 2022-08-13 ENCOUNTER — Inpatient Hospital Stay: Payer: 59 | Attending: Physician Assistant | Admitting: Physician Assistant

## 2022-08-13 VITALS — BP 134/91 | HR 109 | Temp 98.4°F | Resp 17 | Wt 194.3 lb

## 2022-08-13 VITALS — HR 100

## 2022-08-13 DIAGNOSIS — E538 Deficiency of other specified B group vitamins: Secondary | ICD-10-CM | POA: Diagnosis not present

## 2022-08-13 DIAGNOSIS — Z5112 Encounter for antineoplastic immunotherapy: Secondary | ICD-10-CM | POA: Diagnosis present

## 2022-08-13 DIAGNOSIS — C3491 Malignant neoplasm of unspecified part of right bronchus or lung: Secondary | ICD-10-CM | POA: Diagnosis not present

## 2022-08-13 DIAGNOSIS — Z79899 Other long term (current) drug therapy: Secondary | ICD-10-CM | POA: Insufficient documentation

## 2022-08-13 DIAGNOSIS — I10 Essential (primary) hypertension: Secondary | ICD-10-CM | POA: Diagnosis not present

## 2022-08-13 DIAGNOSIS — C7931 Secondary malignant neoplasm of brain: Secondary | ICD-10-CM | POA: Diagnosis not present

## 2022-08-13 DIAGNOSIS — E230 Hypopituitarism: Secondary | ICD-10-CM | POA: Insufficient documentation

## 2022-08-13 DIAGNOSIS — F17210 Nicotine dependence, cigarettes, uncomplicated: Secondary | ICD-10-CM | POA: Diagnosis not present

## 2022-08-13 DIAGNOSIS — G43909 Migraine, unspecified, not intractable, without status migrainosus: Secondary | ICD-10-CM | POA: Diagnosis not present

## 2022-08-13 DIAGNOSIS — C7951 Secondary malignant neoplasm of bone: Secondary | ICD-10-CM | POA: Insufficient documentation

## 2022-08-13 DIAGNOSIS — Z5111 Encounter for antineoplastic chemotherapy: Secondary | ICD-10-CM | POA: Diagnosis present

## 2022-08-13 DIAGNOSIS — R5383 Other fatigue: Secondary | ICD-10-CM | POA: Insufficient documentation

## 2022-08-13 DIAGNOSIS — C3411 Malignant neoplasm of upper lobe, right bronchus or lung: Secondary | ICD-10-CM | POA: Diagnosis present

## 2022-08-13 DIAGNOSIS — Z923 Personal history of irradiation: Secondary | ICD-10-CM | POA: Insufficient documentation

## 2022-08-13 LAB — CMP (CANCER CENTER ONLY)
ALT: 25 U/L (ref 0–44)
AST: 18 U/L (ref 15–41)
Albumin: 4 g/dL (ref 3.5–5.0)
Alkaline Phosphatase: 71 U/L (ref 38–126)
Anion gap: 7 (ref 5–15)
BUN: 16 mg/dL (ref 8–23)
CO2: 29 mmol/L (ref 22–32)
Calcium: 9.4 mg/dL (ref 8.9–10.3)
Chloride: 106 mmol/L (ref 98–111)
Creatinine: 1.01 mg/dL (ref 0.61–1.24)
GFR, Estimated: >60 mL/min (ref >60)
Glucose, Bld: 182 mg/dL — ABNORMAL HIGH (ref 70–99)
Potassium: 3.7 mmol/L (ref 3.5–5.1)
Sodium: 142 mmol/L (ref 135–145)
Total Bilirubin: 0.3 mg/dL (ref 0.3–1.2)
Total Protein: 7 g/dL (ref 6.5–8.1)

## 2022-08-13 LAB — CBC WITH DIFFERENTIAL (CANCER CENTER ONLY)
Abs Immature Granulocytes: 0.1 10*3/uL — ABNORMAL HIGH (ref 0.00–0.07)
Basophils Absolute: 0.1 10*3/uL (ref 0.0–0.1)
Basophils Relative: 1 %
Eosinophils Absolute: 0 10*3/uL (ref 0.0–0.5)
Eosinophils Relative: 0 %
HCT: 43.1 % (ref 39.0–52.0)
Hemoglobin: 13.4 g/dL (ref 13.0–17.0)
Immature Granulocytes: 1 %
Lymphocytes Relative: 8 %
Lymphs Abs: 0.9 10*3/uL (ref 0.7–4.0)
MCH: 28.6 pg (ref 26.0–34.0)
MCHC: 31.1 g/dL (ref 30.0–36.0)
MCV: 92.1 fL (ref 80.0–100.0)
Monocytes Absolute: 0.3 10*3/uL (ref 0.1–1.0)
Monocytes Relative: 3 %
Neutro Abs: 9.3 10*3/uL — ABNORMAL HIGH (ref 1.7–7.7)
Neutrophils Relative %: 87 %
Platelet Count: 288 10*3/uL (ref 150–400)
RBC: 4.68 MIL/uL (ref 4.22–5.81)
RDW: 17.9 % — ABNORMAL HIGH (ref 11.5–15.5)
WBC Count: 10.6 10*3/uL — ABNORMAL HIGH (ref 4.0–10.5)
nRBC: 0 % (ref 0.0–0.2)

## 2022-08-13 LAB — TSH: TSH: 0.705 u[IU]/mL (ref 0.350–4.500)

## 2022-08-13 MED ORDER — SODIUM CHLORIDE 0.9 % IV SOLN: Freq: Once | INTRAVENOUS | Status: AC

## 2022-08-13 MED ORDER — SODIUM CHLORIDE 0.9 % IV SOLN
500.0000 mg/m2 | Freq: Once | INTRAVENOUS | Status: AC
  Administered 2022-08-13: 1000 mg via INTRAVENOUS
  Filled 2022-08-13: qty 40

## 2022-08-13 MED ORDER — PROCHLORPERAZINE MALEATE 10 MG PO TABS
10.0000 mg | ORAL_TABLET | Freq: Once | ORAL | Status: AC
  Administered 2022-08-13: 10 mg via ORAL
  Filled 2022-08-13: qty 1

## 2022-08-13 MED ORDER — SODIUM CHLORIDE 0.9 % IV SOLN
200.0000 mg | Freq: Once | INTRAVENOUS | Status: AC
  Administered 2022-08-13: 200 mg via INTRAVENOUS
  Filled 2022-08-13: qty 200

## 2022-08-13 NOTE — Patient Instructions (Signed)
[facility]  Discharge Instructions: Thank you for choosing [facility] to provide your oncology and hematology care.   If you have a lab appointment with the [facility], please go directly to the [facility] and check in at the registration area.   Wear comfortable clothing and clothing appropriate for easy access to any Portacath or PICC line.   We strive to give you quality time with your provider. You may need to reschedule your appointment if you arrive late (15 or more minutes).  Arriving late affects you and other patients whose appointments are after yours.  Also, if you miss three or more appointments without notifying the office, you may be dismissed from the clinic at the provider's discretion.      For prescription refill requests, have your pharmacy contact our office and allow 72 hours for refills to be completed.    Today you received the following chemotherapy and/or immunotherapy agents keytruda, alimta      To help prevent nausea and vomiting after your treatment, we encourage you to take your nausea medication as directed.  BELOW ARE SYMPTOMS THAT SHOULD BE REPORTED IMMEDIATELY: *FEVER GREATER THAN 100.4 F (38 C) OR HIGHER *CHILLS OR SWEATING *NAUSEA AND VOMITING THAT IS NOT CONTROLLED WITH YOUR NAUSEA MEDICATION *UNUSUAL SHORTNESS OF BREATH *UNUSUAL BRUISING OR BLEEDING *URINARY PROBLEMS (pain or burning when urinating, or frequent urination) *BOWEL PROBLEMS (unusual diarrhea, constipation, pain near the anus) TENDERNESS IN MOUTH AND THROAT WITH OR WITHOUT PRESENCE OF ULCERS (sore throat, sores in mouth, or a toothache) UNUSUAL RASH, SWELLING OR PAIN  UNUSUAL VAGINAL DISCHARGE OR ITCHING   Items with * indicate a potential emergency and should be followed up as soon as possible or go to the Emergency Department if any problems should occur.  Please show the CHEMOTHERAPY ALERT CARD or IMMUNOTHERAPY ALERT CARD at  check-in to the Emergency Department and triage nurse.  Should you have questions after your visit or need to cancel or reschedule your appointment, please contact [facility]  Dept: [phone]  and follow the prompts.  Office hours are 8:00 a.m. to 4:30 p.m. Monday - Friday. Please note that voicemails left after 4:00 p.m. may not be returned until the following business day.  We are closed weekends and major holidays. You have access to a nurse at all times for urgent questions. Please call the main number to the clinic Dept: [phone] and follow the prompts.   For any non-urgent questions, you may also contact your provider using MyChart. We now offer e-Visits for anyone 60 and older to request care online for non-urgent symptoms. For details visit [email or web address].   Also download the MyChart app! Go to the app store, search "MyChart", open the app, select [facility], and log in with your MyChart username and password.  Masks are optional in the cancer centers. If you would like for your care team to wear a mask while they are taking care of you, please let them know. You may have one support Bradley Goodman who is at least 65 years old accompany you for your appointments.

## 2022-08-14 ENCOUNTER — Other Ambulatory Visit: Payer: Self-pay

## 2022-08-16 ENCOUNTER — Telehealth: Payer: Self-pay | Admitting: Internal Medicine

## 2022-08-16 ENCOUNTER — Other Ambulatory Visit: Payer: Self-pay

## 2022-08-16 NOTE — Telephone Encounter (Signed)
Scheduled per 11/06 work-queue, patient has been called regarding upcoming ALL upcoming appointments. Left a voicemail.

## 2022-08-20 ENCOUNTER — Other Ambulatory Visit: Payer: Self-pay | Admitting: "Endocrinology

## 2022-08-20 ENCOUNTER — Other Ambulatory Visit (HOSPITAL_COMMUNITY): Payer: Self-pay

## 2022-08-21 ENCOUNTER — Other Ambulatory Visit (HOSPITAL_COMMUNITY): Payer: Self-pay

## 2022-08-21 MED ORDER — LEVOTHYROXINE SODIUM 50 MCG PO TABS
50.0000 ug | ORAL_TABLET | Freq: Every day | ORAL | 0 refills | Status: DC
  Filled 2022-08-21: qty 90, 90d supply, fill #0

## 2022-08-22 ENCOUNTER — Other Ambulatory Visit (HOSPITAL_COMMUNITY): Payer: Self-pay

## 2022-09-03 ENCOUNTER — Inpatient Hospital Stay: Payer: 59

## 2022-09-03 ENCOUNTER — Ambulatory Visit: Payer: 59

## 2022-09-03 ENCOUNTER — Other Ambulatory Visit: Payer: 59

## 2022-09-03 ENCOUNTER — Ambulatory Visit: Payer: 59 | Admitting: Physician Assistant

## 2022-09-03 ENCOUNTER — Inpatient Hospital Stay: Payer: 59 | Admitting: Physician Assistant

## 2022-09-03 ENCOUNTER — Ambulatory Visit: Payer: 59 | Admitting: Internal Medicine

## 2022-09-03 ENCOUNTER — Other Ambulatory Visit: Payer: Self-pay

## 2022-09-03 VITALS — BP 137/87 | HR 103 | Temp 98.3°F | Resp 15 | Wt 197.1 lb

## 2022-09-03 VITALS — BP 148/98 | HR 93 | Resp 16

## 2022-09-03 DIAGNOSIS — E230 Hypopituitarism: Secondary | ICD-10-CM | POA: Diagnosis not present

## 2022-09-03 DIAGNOSIS — C3491 Malignant neoplasm of unspecified part of right bronchus or lung: Secondary | ICD-10-CM

## 2022-09-03 DIAGNOSIS — I10 Essential (primary) hypertension: Secondary | ICD-10-CM | POA: Diagnosis not present

## 2022-09-03 DIAGNOSIS — Z5112 Encounter for antineoplastic immunotherapy: Secondary | ICD-10-CM | POA: Diagnosis not present

## 2022-09-03 DIAGNOSIS — Z5111 Encounter for antineoplastic chemotherapy: Secondary | ICD-10-CM

## 2022-09-03 DIAGNOSIS — Z79899 Other long term (current) drug therapy: Secondary | ICD-10-CM | POA: Diagnosis not present

## 2022-09-03 DIAGNOSIS — E538 Deficiency of other specified B group vitamins: Secondary | ICD-10-CM | POA: Diagnosis not present

## 2022-09-03 DIAGNOSIS — C7931 Secondary malignant neoplasm of brain: Secondary | ICD-10-CM | POA: Diagnosis not present

## 2022-09-03 DIAGNOSIS — C7951 Secondary malignant neoplasm of bone: Secondary | ICD-10-CM | POA: Diagnosis not present

## 2022-09-03 DIAGNOSIS — C3411 Malignant neoplasm of upper lobe, right bronchus or lung: Secondary | ICD-10-CM | POA: Diagnosis not present

## 2022-09-03 LAB — CMP (CANCER CENTER ONLY)
ALT: 23 U/L (ref 0–44)
AST: 18 U/L (ref 15–41)
Albumin: 3.8 g/dL (ref 3.5–5.0)
Alkaline Phosphatase: 63 U/L (ref 38–126)
Anion gap: 7 (ref 5–15)
BUN: 13 mg/dL (ref 8–23)
CO2: 26 mmol/L (ref 22–32)
Calcium: 8.8 mg/dL — ABNORMAL LOW (ref 8.9–10.3)
Chloride: 108 mmol/L (ref 98–111)
Creatinine: 1.16 mg/dL (ref 0.61–1.24)
GFR, Estimated: >60 mL/min (ref >60)
Glucose, Bld: 208 mg/dL — ABNORMAL HIGH (ref 70–99)
Potassium: 3.5 mmol/L (ref 3.5–5.1)
Sodium: 141 mmol/L (ref 135–145)
Total Bilirubin: 0.3 mg/dL (ref 0.3–1.2)
Total Protein: 6.3 g/dL — ABNORMAL LOW (ref 6.5–8.1)

## 2022-09-03 LAB — CBC WITH DIFFERENTIAL (CANCER CENTER ONLY)
Abs Immature Granulocytes: 0.06 10*3/uL (ref 0.00–0.07)
Basophils Absolute: 0.1 10*3/uL (ref 0.0–0.1)
Basophils Relative: 1 %
Eosinophils Absolute: 0 10*3/uL (ref 0.0–0.5)
Eosinophils Relative: 0 %
HCT: 39.2 % (ref 39.0–52.0)
Hemoglobin: 12.2 g/dL — ABNORMAL LOW (ref 13.0–17.0)
Immature Granulocytes: 1 %
Lymphocytes Relative: 7 %
Lymphs Abs: 0.8 10*3/uL (ref 0.7–4.0)
MCH: 28.3 pg (ref 26.0–34.0)
MCHC: 31.1 g/dL (ref 30.0–36.0)
MCV: 91 fL (ref 80.0–100.0)
Monocytes Absolute: 0.4 10*3/uL (ref 0.1–1.0)
Monocytes Relative: 4 %
Neutro Abs: 9.6 10*3/uL — ABNORMAL HIGH (ref 1.7–7.7)
Neutrophils Relative %: 87 %
Platelet Count: 248 10*3/uL (ref 150–400)
RBC: 4.31 MIL/uL (ref 4.22–5.81)
RDW: 17.7 % — ABNORMAL HIGH (ref 11.5–15.5)
WBC Count: 10.9 10*3/uL — ABNORMAL HIGH (ref 4.0–10.5)
nRBC: 0 % (ref 0.0–0.2)

## 2022-09-03 LAB — TSH: TSH: 0.751 u[IU]/mL (ref 0.350–4.500)

## 2022-09-03 MED ORDER — SODIUM CHLORIDE 0.9 % IV SOLN: Freq: Once | INTRAVENOUS | Status: AC

## 2022-09-03 MED ORDER — PROCHLORPERAZINE MALEATE 10 MG PO TABS
10.0000 mg | ORAL_TABLET | Freq: Once | ORAL | Status: AC
  Administered 2022-09-03: 10 mg via ORAL
  Filled 2022-09-03: qty 1

## 2022-09-03 MED ORDER — SODIUM CHLORIDE 0.9 % IV SOLN
500.0000 mg/m2 | Freq: Once | INTRAVENOUS | Status: AC
  Administered 2022-09-03: 1000 mg via INTRAVENOUS
  Filled 2022-09-03: qty 40

## 2022-09-03 MED ORDER — SODIUM CHLORIDE 0.9 % IV SOLN
200.0000 mg | Freq: Once | INTRAVENOUS | Status: AC
  Administered 2022-09-03: 200 mg via INTRAVENOUS
  Filled 2022-09-03: qty 200

## 2022-09-03 MED ORDER — CYANOCOBALAMIN 1000 MCG/ML IJ SOLN
1000.0000 ug | Freq: Once | INTRAMUSCULAR | Status: AC
  Administered 2022-09-03: 1000 ug via INTRAMUSCULAR
  Filled 2022-09-03: qty 1

## 2022-09-03 NOTE — Progress Notes (Signed)
[facility] OFFICE PROGRESS NOTE  Bradley Goodman, Bradley Goodman [id number]  DIAGNOSIS: Stage IV (T1b, N3, M1C) non-small cell lung cancer, favoring adenocarcinoma presented with right upper lobe lung nodule in addition to right hilar, subcarinal and bilateral mediastinal as well as supraclavicular lymphadenopathy in addition to bone and brain metastasis diagnosed in June 2022.   PD-L1 expression 80%.     Molecular Studies:  Biomarker Findings Microsatellite status - MS-Stable Tumor Mutational Burden - 6 Muts/Mb Genomic Findings For a complete list of the genes assayed, please refer to the Appendix. KRAS G12C, amplification ATM S470* CCND1 amplification - equivocal? HGF amplification - equivocal? MYC amplification - equivocal? FGF19 amplification - equivocal? FGF3 amplification - equivocal? FGF4 amplification - equivocal? NFKBIA amplification NKX2-1 amplification RAD21 amplification - equivocal? [id number]*26 TERT promoter -124C>T TP53 rearrangement exon 9 7 Disease relevant genes with no reportable alterations: ALK, BRAF, EGFR, ERBB2, MET, RET, ROS1  PRIOR THERAPY: SRS to the metastatic brain lesions under the care of Dr. MTammi Klippel  Last treatment on 05/04/2021.    CURRENT THERAPY:  Palliative systemic chemotherapy with carboplatin for an AUC 5, Alimta 500 mg/m2 and, Keytruda 200 mg IV every 3 weeks.  First dose expected on 05/08/2021.  Status post 23 cycles.  Starting from cycle #5 he is on maintenance treatment with Alimta and Keytruda every 3 weeks.    INTERVAL HISTORY: Bradley Goodman 66y.o. male returns to the clinic today for a follow-up visit accompanied by his wife. The patient is feeling fairly well today without any new concerning complaints. He does have watery eyes without discharge, erythema, or itching. His wife is wondering if that is related to treatment. Today, he denies any fever, chills, night sweats, or unexplained weight  loss.  Denies any dyspnea, cough, chest pain, or hemoptysis.  Denies any nausea, vomiting, unusual diarrhea, or constipation. He sometimes feels bloated and diarrhea with ozempic. The patient reports a long history of migraines. He denies unusual headaches. He denies any vision changes. If anything, he believes his vision is better. He is here today for evaluation and repeat blood work before undergoing cycle #24.   MEDICAL HISTORY: Past Medical History:  Diagnosis Date   Cervical spondylolysis    Essential hypertension    GERD (gastroesophageal reflux disease)    History of kidney stones    History of migraine    Hyperlipidemia    Hypertension    PONV (postoperative nausea and vomiting)    Type 2 diabetes mellitus (HCC)     ALLERGIES:  has No Known Allergies.  MEDICATIONS:  Current Outpatient Medications  Medication Sig Dispense Refill   cholecalciferol (VITAMIN D) 1000 UNITS tablet Take 1,000 Units by mouth every evening.     Continuous Blood Gluc Sensor (FREESTYLE LIBRE 3 SENSOR) MISC Place 1 sensor on the skin every 14 days. Use to check glucose continuously 2 each 2   dexamethasone (DECADRON) 1 MG tablet Take 2 tablets by mouth daily. 180 tablet 1   docusate sodium (COLACE) 100 MG capsule Take 100 mg by mouth daily.     folic acid (FOLVITE) 1 MG tablet Take 1 tablet by mouth daily. 90 tablet 4   Krill Oil 300 MG CAPS Take 300 mg by mouth every evening.     levothyroxine (SYNTHROID) 50 MCG tablet Take 1 tablet (50 mcg total) by mouth daily before breakfast. 90 tablet 0   loratadine (CLARITIN) 10 MG tablet Take 10 mg by mouth every evening.  losartan (COZAAR) 50 MG tablet Take 1 and 1/2 tablets (75 mg total) by mouth daily. 135 tablet 1   mirtazapine (REMERON) 15 MG tablet Take 1 tablet by mouth at bedtime. 90 tablet 1   Multiple Vitamins-Minerals (MULTIVITAMIN GUMMIES ADULT PO) Take 2 each by mouth daily.     pantoprazole (PROTONIX) 40 MG tablet Take 1 tablet by mouth every  evening. 90 tablet 1   PRESCRIPTION MEDICATION Testosterone injection     prochlorperazine (COMPAZINE) 10 MG tablet Take 1 tablet (10 mg total) by mouth every 6 (six) hours as needed for nausea or vomiting. 30 tablet 0   rosuvastatin (CRESTOR) 20 MG tablet Take 1 tablet (20 mg total) by mouth daily. 90 tablet 1   Semaglutide,0.25 or 0.5MG/DOS, (OZEMPIC, 0.25 OR 0.5 MG/DOSE,) 2 MG/3ML SOPN Inject 0.5 mg into the skin once a week. 9 mL 0   SYRINGE-NEEDLE, DISP, 3 ML 21G X 1-1/2" 3 ML MISC Use to inject testosterone every week 100 each 2   testosterone cypionate (DEPOTESTOTERONE CYPIONATE) 100 MG/ML injection Inject 0.5 mLs (50 mg total) into the muscle once a week. For IM use only. 10 mL 0   venlafaxine XR (EFFEXOR-XR) 75 MG 24 hr capsule Take 1 capsule (75 mg total) by mouth daily with breakfast. 90 capsule 1   No current facility-administered medications for this visit.    SURGICAL HISTORY:  Past Surgical History:  Procedure Laterality Date   Bilateral inguinal hernia repair     BRONCHIAL NEEDLE ASPIRATION BIOPSY  04/05/2021   Procedure: BRONCHIAL NEEDLE ASPIRATION BIOPSIES;  Surgeon: Garner Nash, DO;  Location: [facility];  Service: Pulmonary;;   COLONOSCOPY  01/23/2012   Procedure: COLONOSCOPY;  Surgeon: Daneil Dolin, MD;  [facility];  Service: Endoscopy;  Laterality: N/A;  9:30 AM   COLONOSCOPY N/A 10/11/2015   Procedure: COLONOSCOPY;  Surgeon: Daneil Dolin, MD;  [facility];  Service: Endoscopy;  Laterality: N/A;  830   COLONOSCOPY N/A 10/14/2019   Procedure: COLONOSCOPY;  Surgeon: Daneil Dolin, MD;  [facility];  Service: Endoscopy;  Laterality: N/A;  1:45   POLYPECTOMY  10/14/2019   Procedure: POLYPECTOMY;  Surgeon: Daneil Dolin, MD;  [facility];  Service: Endoscopy;;  ascending colon, descending colon   VIDEO BRONCHOSCOPY WITH ENDOBRONCHIAL ULTRASOUND N/A 04/05/2021   Procedure: VIDEO BRONCHOSCOPY WITH ENDOBRONCHIAL  ULTRASOUND;  Surgeon: Garner Nash, DO;  Location: [facility];  Service: Pulmonary;  Laterality: N/A;    REVIEW OF SYSTEMS:   Constitutional: Positive for baseline fatigue. Negative for appetite change, chills, fever and unexpected weight change.  HENT: Negative for mouth sores, nosebleeds, sore throat and trouble swallowing.   Eyes: Negative for eye problems and icterus.  Respiratory: Positive for occasional baseline shortness of breath. Negative for cough, hemoptysis, and wheezing.   Cardiovascular: Negative for chest pain and leg swelling.  Gastrointestinal:  Negative for abdominal pain, diarrhea, constipation, nausea and vomiting.  Genitourinary: Negative for bladder incontinence, difficulty urinating, dysuria, frequency and hematuria.   Musculoskeletal: Negative for back pain, gait problem, neck pain and neck stiffness.  Skin: Negative for itching and rash.  Neurological: Negative for dizziness, headache, extremity weakness, gait problem, headaches, light-headedness and seizures.  Hematological: Negative for adenopathy. Does not bruise/bleed easily.  Psychiatric/Behavioral: Negative for confusion, depression and sleep disturbance. The patient is not nervous/anxious.   PHYSICAL EXAMINATION:  Blood pressure 137/87, pulse (!) 103, temperature 98.3 F (36.8 C), temperature source Oral, resp. rate  15, weight 197 lb 1.6 oz (89.4 kg), SpO2 99 %.  ECOG PERFORMANCE STATUS: 1  Physical Exam  Constitutional: Oriented to person, place, and time and well-developed, well-nourished, and in no distress. No distress.  HENT:  Head: Normocephalic and atraumatic.  Mouth/Throat: Oropharynx is clear and moist. No oropharyngeal exudate.  Eyes: Conjunctivae are normal. Right eye exhibits no discharge. Left eye exhibits no discharge. No scleral icterus.  Neck: Normal range of motion. Neck supple.  Cardiovascular: Normal rate, regular rhythm, normal heart sounds and intact distal pulses.    Pulmonary/Chest: Effort normal and breath sounds normal. No respiratory distress. No wheezes. No rales.  Abdominal: Soft. Bowel sounds are normal. Exhibits no distension and no mass. There is no tenderness.  Musculoskeletal: Normal range of motion. Exhibits no edema.  Lymphadenopathy:    No cervical adenopathy.  Neurological: Alert and oriented to person, place, and time. Exhibits normal muscle tone. Gait normal. Coordination normal.  Skin: Skin is warm and dry. No rash noted. Not diaphoretic. No erythema. No pallor.  Psychiatric: Mood, memory and judgment normal.  Vitals reviewed.    LABORATORY DATA: Lab Results  Component Value Date   WBC 10.9 (H) 09/03/2022   HGB 12.2 (L) 09/03/2022   HCT 39.2 09/03/2022   MCV 91.0 09/03/2022   PLT 248 09/03/2022      Chemistry      Component Value Date/Time   NA 141 09/03/2022 1207   NA 144 01/10/2021 0849   K 3.5 09/03/2022 1207   CL 108 09/03/2022 1207   CO2 26 09/03/2022 1207   BUN 13 09/03/2022 1207   BUN 15 01/10/2021 0849   CREATININE 1.16 09/03/2022 1207      Component Value Date/Time   CALCIUM 8.8 (L) 09/03/2022 1207   ALKPHOS 63 09/03/2022 1207   AST 18 09/03/2022 1207   ALT 23 09/03/2022 1207   BILITOT 0.3 09/03/2022 1207       RADIOGRAPHIC STUDIES:  No results found.   ASSESSMENT/PLAN:  This is a very pleasant 66 year old Caucasian male diagnosed with stage IV (T1b, N3, M1 C) non-small cell lung cancer, favoring adenocarcinoma.  The patient presented with a right upper lobe lung nodule in addition to right hilar, subcarinal, and bilateral mediastinal lymphadenopathy as well as supraclavicular lymphadenopathy.  The patient also has metastatic disease to the brain.  He was diagnosed in June 2022.  His PD-L1 expression is 80%.  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   The patient completed SRS to the brain lesions under  the care of Dr. Tammi Klippel.  His last treatment was on 05/04/2021. 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    The patient is currently undergoing systemic chemotherapy with carboplatin for an AUC of 5, Alimta 500 mg per metered squared, Keytruda 200 mg IV every 3 weeks.  Status post 23 cycles. Starting from cycle #5, the patient started maintenance Alimta and Keytruda IV every 3 weeks.    Labs were reviewed. His TSH is pending. Recommend that he proceed with cycle #24 today scheduled.   We will see him back for follow-up visit in 3 weeks for evaluation and repeat blood work before starting cycle #25  Discussed his Alimta can cause tearing. Advised he use refresh tears.   The patient was advised to call immediately if he has any concerning symptoms in the interval. The patient voices  understanding of current disease status and treatment options and is in agreement with the current care plan. All questions were answered. The patient knows to call the clinic with any problems, questions or concerns. We can certainly see the patient much sooner if necessary   No orders of the defined types were placed in this encounter.    The total time spent in the appointment was 20-29 minutes.   Bradley Cerasoli L Tarini Carrier, PA-C 09/03/22

## 2022-09-03 NOTE — Progress Notes (Signed)
Per Cassie H, PA-C, ok to treat with elevated HR today.

## 2022-09-03 NOTE — Progress Notes (Deleted)
[facility]   Telephone:(336) [phone] Fax:([phone]) [phone]   Clinic Follow up Note   Patient Care Team: [name], Connye Burkitt, FNP as PCP - General (Family Medicine) Gala Romney, Cristopher Estimable, MD as Consulting Physician (Gastroenterology) Celestia Khat, [place] (Optometry) 09/03/2022  CHIEF COMPLAINT: Follow up metastatic non-small cell lung cancer   SUMMARY OF ONCOLOGIC HISTORY: Oncology History  Adenocarcinoma of right lung, stage 4 ([facility])  04/25/2021 Initial Diagnosis   Adenocarcinoma of right lung, stage 4 ([facility])   04/25/2021 Cancer Staging   Staging form: Lung, AJCC 8th Edition - Clinical: Stage IVB (cT1b, cN3, cM1c) - Signed by Curt Bears, MD on 04/25/2021   05/08/2021 -  Chemotherapy   Patient is on Treatment Plan : LUNG Carboplatin (5) + Pemetrexed (500) + Pembrolizumab (200) D1 q21d Induction x 4 cycles / Maintenance Pemetrexed (500) + Pembrolizumab (200) D1 q21d       CURRENT THERAPY: Palliative systemic chemotherapy with carboplatin AUC 5, Alimta 500 mg/m2 and, Keytruda 200 mg IV every 3 weeks; started 05/08/2021.  Starting from cycle #5 he is on maintenance treatment with Alimta and Keytruda every 3 weeks.    INTERVAL HISTORY: Mr. Heffron returns for follow up as scheduled. Last seen by my colleague Fritz Pickerel, PA on 11/6 and completed another cycle (#23) of maintenance Alimta/keytruda.    REVIEW OF SYSTEMS:   Constitutional: Denies fevers, chills or abnormal weight loss Eyes: Denies blurriness of vision Ears, nose, mouth, throat, and face: Denies mucositis or sore throat Respiratory: Denies cough, dyspnea or wheezes Cardiovascular: Denies palpitation, chest discomfort or lower extremity swelling Gastrointestinal:  Denies nausea, heartburn or change in bowel habits Skin: Denies abnormal skin rashes Lymphatics: Denies new lymphadenopathy or easy bruising Neurological:Denies numbness, tingling or new weaknesses Behavioral/Psych: Mood is stable, no new changes  All other  systems were reviewed with the patient and are negative.  MEDICAL HISTORY:  Past Medical History:  Diagnosis Date   Cervical spondylolysis    Essential hypertension    GERD (gastroesophageal reflux disease)    History of kidney stones    History of migraine    Hyperlipidemia    Hypertension    PONV (postoperative nausea and vomiting)    Type 2 diabetes mellitus ([place])     SURGICAL HISTORY: Past Surgical History:  Procedure Laterality Date   Bilateral inguinal hernia repair     BRONCHIAL NEEDLE ASPIRATION BIOPSY  04/05/2021   Procedure: BRONCHIAL NEEDLE ASPIRATION BIOPSIES;  Surgeon: Garner Nash, DO;  Location: [facility];  Service: Pulmonary;;   COLONOSCOPY  01/23/2012   Procedure: COLONOSCOPY;  Surgeon: Daneil Dolin, MD;  [facility];  Service: Endoscopy;  Laterality: N/A;  9:30 AM   COLONOSCOPY N/A 10/11/2015   Procedure: COLONOSCOPY;  Surgeon: Daneil Dolin, MD;  [facility];  Service: Endoscopy;  Laterality: N/A;  830   COLONOSCOPY N/A 10/14/2019   Procedure: COLONOSCOPY;  Surgeon: Daneil Dolin, MD;  [facility];  Service: Endoscopy;  Laterality: N/A;  1:45   POLYPECTOMY  10/14/2019   Procedure: POLYPECTOMY;  Surgeon: Daneil Dolin, MD;  [facility];  Service: Endoscopy;;  ascending colon, descending colon   VIDEO BRONCHOSCOPY WITH ENDOBRONCHIAL ULTRASOUND N/A 04/05/2021   Procedure: VIDEO BRONCHOSCOPY WITH ENDOBRONCHIAL ULTRASOUND;  Surgeon: Garner Nash, DO;  Location: [facility];  Service: Pulmonary;  Laterality: N/A;    I have reviewed the social history and family history with the patient and they are unchanged from previous note.  ALLERGIES:  has No Known Allergies.  MEDICATIONS:  Current Outpatient Medications  Medication Sig Dispense Refill   cholecalciferol (VITAMIN D) 1000 UNITS tablet Take 1,000 Units by mouth every evening.     Continuous Blood Gluc Sensor (FREESTYLE LIBRE 3 SENSOR) MISC Place 1  sensor on the skin every 14 days. Use to check glucose continuously 2 each 2   dexamethasone (DECADRON) 1 MG tablet Take 2 tablets by mouth daily. 180 tablet 1   docusate sodium (COLACE) 100 MG capsule Take 100 mg by mouth daily.     folic acid (FOLVITE) 1 MG tablet Take 1 tablet by mouth daily. 90 tablet 4   Krill Oil 300 MG CAPS Take 300 mg by mouth every evening.     levothyroxine (SYNTHROID) 50 MCG tablet Take 1 tablet (50 mcg total) by mouth daily before breakfast. 90 tablet 0   loratadine (CLARITIN) 10 MG tablet Take 10 mg by mouth every evening.      losartan (COZAAR) 50 MG tablet Take 1 and 1/2 tablets (75 mg total) by mouth daily. 135 tablet 1   mirtazapine (REMERON) 15 MG tablet Take 1 tablet by mouth at bedtime. 90 tablet 1   Multiple Vitamins-Minerals (MULTIVITAMIN GUMMIES ADULT PO) Take 2 each by mouth daily.     pantoprazole (PROTONIX) 40 MG tablet Take 1 tablet by mouth every evening. 90 tablet 1   PRESCRIPTION MEDICATION Testosterone injection     prochlorperazine (COMPAZINE) 10 MG tablet Take 1 tablet (10 mg total) by mouth every 6 (six) hours as needed for nausea or vomiting. 30 tablet 0   rosuvastatin (CRESTOR) 20 MG tablet Take 1 tablet (20 mg total) by mouth daily. 90 tablet 1   Semaglutide,0.25 or 0.5MG /DOS, (OZEMPIC, 0.25 OR 0.5 MG/DOSE,) 2 MG/3ML SOPN Inject 0.5 mg into the skin once a week. 9 mL 0   SYRINGE-NEEDLE, DISP, 3 ML 21G X 1-1/2" 3 ML MISC Use to inject testosterone every week 100 each 2   testosterone cypionate (DEPOTESTOTERONE CYPIONATE) 100 MG/ML injection Inject 0.5 mLs (50 mg total) into the muscle once a week. For IM use only. 10 mL 0   venlafaxine XR (EFFEXOR-XR) 75 MG 24 hr capsule Take 1 capsule (75 mg total) by mouth daily with breakfast. 90 capsule 1   No current facility-administered medications for this visit.    PHYSICAL EXAMINATION: ECOG PERFORMANCE STATUS: {CHL ONC ECOG PS:[phone]}  There were no vitals filed for this visit. There were no  vitals filed for this visit.  GENERAL:alert, no distress and comfortable SKIN: skin color, texture, turgor are normal, no rashes or significant lesions EYES: normal, Conjunctiva are pink and non-injected, sclera clear OROPHARYNX:no exudate, no erythema and lips, buccal mucosa, and tongue normal  NECK: supple, thyroid normal size, non-tender, without nodularity LYMPH:  no palpable lymphadenopathy in the cervical, axillary or inguinal LUNGS: clear to auscultation and percussion with normal breathing effort HEART: regular rate & rhythm and no murmurs and no lower extremity edema ABDOMEN:abdomen soft, non-tender and normal bowel sounds Musculoskeletal:no cyanosis of digits and no clubbing  NEURO: alert & oriented x 3 with fluent speech, no focal motor/sensory deficits  LABORATORY DATA:  I have reviewed the data as listed    Latest Ref Rng & Units 08/13/2022   10:28 AM 07/23/2022    9:44 AM 07/02/2022    9:35 AM  CBC  WBC 4.0 - 10.5 K/uL 10.6  11.5  9.8   Hemoglobin 13.0 - 17.0 g/dL 13.4  13.7  12.1  Hematocrit 39.0 - 52.0 % 43.1  43.7  38.6   Platelets 150 - 400 K/uL 288  270  243         Latest Ref Rng & Units 08/13/2022   10:28 AM 07/23/2022    9:44 AM 07/02/2022    9:35 AM  CMP  Glucose 70 - 99 mg/dL 182  158  117   BUN 8 - 23 mg/dL 16  15  15    Creatinine 0.61 - 1.24 mg/dL 1.01  1.06  1.04   Sodium 135 - 145 mmol/L 142  141  141   Potassium 3.5 - 5.1 mmol/L 3.7  3.8  3.7   Chloride 98 - 111 mmol/L 106  105  107   CO2 22 - 32 mmol/L 29  27  28    Calcium 8.9 - 10.3 mg/dL 9.4  9.2  8.9   Total Protein 6.5 - 8.1 g/dL 7.0  6.8  6.6   Total Bilirubin 0.3 - 1.2 mg/dL 0.3  0.3  0.3   Alkaline Phos 38 - 126 U/L 71  74  60   AST 15 - 41 U/L 18  19  25    ALT 0 - 44 U/L 25  26  28        RADIOGRAPHIC STUDIES: I have personally reviewed the radiological images as listed and agreed with the findings in the report. No results found.   ASSESSMENT & PLAN:  No problem-specific  Assessment & Plan notes found for this encounter.   No orders of the defined types were placed in this encounter.  All questions were answered. The patient knows to call the clinic with any problems, questions or concerns. No barriers to learning was detected. I spent {CHL ONC TIME VISIT - FOYDX:[id number]} counseling the patient face to face. The total time spent in the appointment was {CHL ONC TIME VISIT - MCNOB:[id number]} and more than 50% was on counseling and review of test results     Alla Feeling, NP 09/03/22

## 2022-09-03 NOTE — Patient Instructions (Signed)
[facility]  Discharge Instructions: Thank you for choosing [facility] to provide your oncology and hematology care.   If you have a lab appointment with the [facility], please go directly to the [facility] and check in at the registration area.   Wear comfortable clothing and clothing appropriate for easy access to any Portacath or PICC line.   We strive to give you quality time with your provider. You may need to reschedule your appointment if you arrive late (15 or more minutes).  Arriving late affects you and other patients whose appointments are after yours.  Also, if you miss three or more appointments without notifying the office, you may be dismissed from the clinic at the provider's discretion.      For prescription refill requests, have your pharmacy contact our office and allow 72 hours for refills to be completed.    Today you received the following chemotherapy and/or immunotherapy agents: pembrolizumab and pemetrexed      To help prevent nausea and vomiting after your treatment, we encourage you to take your nausea medication as directed.  BELOW ARE SYMPTOMS THAT SHOULD BE REPORTED IMMEDIATELY: *FEVER GREATER THAN 100.4 F (38 C) OR HIGHER *CHILLS OR SWEATING *NAUSEA AND VOMITING THAT IS NOT CONTROLLED WITH YOUR NAUSEA MEDICATION *UNUSUAL SHORTNESS OF BREATH *UNUSUAL BRUISING OR BLEEDING *URINARY PROBLEMS (pain or burning when urinating, or frequent urination) *BOWEL PROBLEMS (unusual diarrhea, constipation, pain near the anus) TENDERNESS IN MOUTH AND THROAT WITH OR WITHOUT PRESENCE OF ULCERS (sore throat, sores in mouth, or a toothache) UNUSUAL RASH, SWELLING OR PAIN  UNUSUAL VAGINAL DISCHARGE OR ITCHING   Items with * indicate a potential emergency and should be followed up as soon as possible or go to the Emergency Department if any problems should occur.  Please show the CHEMOTHERAPY ALERT CARD or IMMUNOTHERAPY ALERT  CARD at check-in to the Emergency Department and triage nurse.  Should you have questions after your visit or need to cancel or reschedule your appointment, please contact [facility]  Dept: [phone]  and follow the prompts.  Office hours are 8:00 a.m. to 4:30 p.m. Monday - Friday. Please note that voicemails left after 4:00 p.m. may not be returned until the following business day.  We are closed weekends and major holidays. You have access to a nurse at all times for urgent questions. Please call the main number to the clinic Dept: [phone] and follow the prompts.   For any non-urgent questions, you may also contact your provider using MyChart. We now offer e-Visits for anyone 23 and older to request care online for non-urgent symptoms. For details visit [email or web address].   Also download the MyChart app! Go to the app store, search "MyChart", open the app, select [facility], and log in with your MyChart username and password.  Masks are optional in the cancer centers. If you would like for your care team to wear a mask while they are taking care of you, please let them know. You may have one support person who is at least 65 years old accompany you for your appointments.

## 2022-09-04 ENCOUNTER — Ambulatory Visit: Payer: 59

## 2022-09-04 ENCOUNTER — Other Ambulatory Visit: Payer: 59

## 2022-09-04 ENCOUNTER — Ambulatory Visit: Payer: 59 | Admitting: Physician Assistant

## 2022-09-04 ENCOUNTER — Other Ambulatory Visit: Payer: Self-pay | Admitting: "Endocrinology

## 2022-09-04 ENCOUNTER — Encounter: Payer: Self-pay | Admitting: Internal Medicine

## 2022-09-04 ENCOUNTER — Other Ambulatory Visit (HOSPITAL_COMMUNITY): Payer: Self-pay

## 2022-09-04 LAB — T4: T4, Total: 7 ug/dL (ref 4.5–12.0)

## 2022-09-05 ENCOUNTER — Other Ambulatory Visit (HOSPITAL_COMMUNITY): Payer: Self-pay

## 2022-09-05 ENCOUNTER — Encounter: Payer: Self-pay | Admitting: Internal Medicine

## 2022-09-05 MED ORDER — TESTOSTERONE CYPIONATE 100 MG/ML IM SOLN
50.0000 mg | INTRAMUSCULAR | 0 refills | Status: DC
  Filled 2022-09-05: qty 10, 90d supply, fill #0

## 2022-09-06 ENCOUNTER — Other Ambulatory Visit (HOSPITAL_COMMUNITY): Payer: Self-pay

## 2022-09-07 ENCOUNTER — Other Ambulatory Visit: Payer: Self-pay

## 2022-09-07 ENCOUNTER — Other Ambulatory Visit (HOSPITAL_COMMUNITY)
Admission: RE | Admit: 2022-09-07 | Discharge: 2022-09-07 | Disposition: A | Discharge status: Home / Self Care | Payer: 59 | Source: Ambulatory Visit | Attending: "Endocrinology | Admitting: "Endocrinology

## 2022-09-07 DIAGNOSIS — E230 Hypopituitarism: Secondary | ICD-10-CM | POA: Diagnosis not present

## 2022-09-07 LAB — COMPREHENSIVE METABOLIC PANEL
ALT: 32 U/L (ref 0–44)
AST: 27 U/L (ref 15–41)
Albumin: 3.5 g/dL (ref 3.5–5.0)
Alkaline Phosphatase: 66 U/L (ref 38–126)
Anion gap: 11 (ref 5–15)
BUN: 16 mg/dL (ref 8–23)
CO2: 27 mmol/L (ref 22–32)
Calcium: 8.9 mg/dL (ref 8.9–10.3)
Chloride: 106 mmol/L (ref 98–111)
Creatinine, Ser: 1.07 mg/dL (ref 0.61–1.24)
GFR, Estimated: >60 mL/min (ref >60)
Glucose, Bld: 113 mg/dL — ABNORMAL HIGH (ref 70–99)
Potassium: 3.6 mmol/L (ref 3.5–5.1)
Sodium: 144 mmol/L (ref 135–145)
Total Bilirubin: 0.3 mg/dL (ref 0.3–1.2)
Total Protein: 6.8 g/dL (ref 6.5–8.1)

## 2022-09-07 LAB — PSA: Prostatic Specific Antigen: 1.95 ng/mL (ref 0.00–4.00)

## 2022-09-07 LAB — TSH: TSH: 1.053 u[IU]/mL (ref 0.350–4.500)

## 2022-09-07 LAB — T4, FREE: Free T4: 0.6 ng/dL — ABNORMAL LOW (ref 0.61–1.12)

## 2022-09-08 ENCOUNTER — Other Ambulatory Visit (HOSPITAL_COMMUNITY): Payer: Self-pay

## 2022-09-11 LAB — TESTOSTERONE,FREE AND TOTAL
Testosterone, Free: 1.1 pg/mL — ABNORMAL LOW (ref 6.6–18.1)
Testosterone: 145 ng/dL — ABNORMAL LOW (ref 264–916)

## 2022-09-17 ENCOUNTER — Other Ambulatory Visit (HOSPITAL_COMMUNITY): Payer: Self-pay

## 2022-09-18 ENCOUNTER — Other Ambulatory Visit (HOSPITAL_COMMUNITY): Payer: Self-pay

## 2022-09-18 ENCOUNTER — Ambulatory Visit: Payer: 59 | Admitting: "Endocrinology

## 2022-09-18 ENCOUNTER — Encounter: Payer: Self-pay | Admitting: "Endocrinology

## 2022-09-18 VITALS — BP 158/100 | HR 96 | Ht 71.5 in | Wt 197.8 lb

## 2022-09-18 DIAGNOSIS — E2740 Unspecified adrenocortical insufficiency: Secondary | ICD-10-CM | POA: Diagnosis not present

## 2022-09-18 DIAGNOSIS — F17200 Nicotine dependence, unspecified, uncomplicated: Secondary | ICD-10-CM

## 2022-09-18 DIAGNOSIS — E039 Hypothyroidism, unspecified: Secondary | ICD-10-CM

## 2022-09-18 DIAGNOSIS — E291 Testicular hypofunction: Secondary | ICD-10-CM

## 2022-09-18 DIAGNOSIS — E230 Hypopituitarism: Secondary | ICD-10-CM

## 2022-09-18 MED ORDER — LEVOTHYROXINE SODIUM 75 MCG PO TABS
75.0000 ug | ORAL_TABLET | Freq: Every day | ORAL | 1 refills | Status: DC
  Filled 2022-09-18: qty 90, 90d supply, fill #0
  Filled 2023-01-22: qty 90, 90d supply, fill #1

## 2022-09-18 MED ORDER — DEXAMETHASONE 1 MG PO TABS
1.0000 mg | ORAL_TABLET | Freq: Every day | ORAL | 1 refills | Status: DC
  Filled 2022-09-18 – 2022-12-04 (×2): qty 90, 90d supply, fill #0
  Filled 2023-03-18: qty 90, 90d supply, fill #1

## 2022-09-18 MED ORDER — TESTOSTERONE CYPIONATE 100 MG/ML IM SOLN: INTRAMUSCULAR | 0 refills | Status: DC

## 2022-09-18 NOTE — Progress Notes (Signed)
Endocrinology follow-up note                                             09/18/2022, 1:05 PM   Subjective:    Patient ID: Bradley Goodman, male    DOB: 10-May-1956, PCP Bradley Gouty, FNP   Past Medical History:  Diagnosis Date   Cervical spondylolysis    Essential hypertension    GERD (gastroesophageal reflux disease)    History of kidney stones    History of migraine    Hyperlipidemia    Hypertension    PONV (postoperative nausea and vomiting)    Type 2 diabetes mellitus ([place])    Past Surgical History:  Procedure Laterality Date   Bilateral inguinal hernia repair     BRONCHIAL NEEDLE ASPIRATION BIOPSY  04/05/2021   Procedure: BRONCHIAL NEEDLE ASPIRATION BIOPSIES;  Surgeon: Bradley Nash, DO;  Location: [facility];  Service: Pulmonary;;   COLONOSCOPY  01/23/2012   Procedure: COLONOSCOPY;  Surgeon: Bradley Dolin, MD;  [facility];  Service: Endoscopy;  Laterality: N/A;  9:30 AM   COLONOSCOPY N/A 10/11/2015   Procedure: COLONOSCOPY;  Surgeon: Bradley Dolin, MD;  [facility];  Service: Endoscopy;  Laterality: N/A;  830   COLONOSCOPY N/A 10/14/2019   Procedure: COLONOSCOPY;  Surgeon: Bradley Dolin, MD;  [facility];  Service: Endoscopy;  Laterality: N/A;  1:45   POLYPECTOMY  10/14/2019   Procedure: POLYPECTOMY;  Surgeon: Bradley Dolin, MD;  [facility];  Service: Endoscopy;;  ascending colon, descending colon   VIDEO BRONCHOSCOPY WITH ENDOBRONCHIAL ULTRASOUND N/A 04/05/2021   Procedure: VIDEO BRONCHOSCOPY WITH ENDOBRONCHIAL ULTRASOUND;  Surgeon: Bradley Nash, DO;  Location: [facility];  Service: Pulmonary;  Laterality: N/A;   Social History   Socioeconomic History   Marital status: Married    Spouse name: Not on file   Number of children: Not on file   Years of education: Not on file   Highest education level: Not on file  Occupational History   Not on file  Tobacco Use   Smoking status: Every Day     Packs/day: 0.50    Years: 30.00    Total pack years: 15.00    Types: Cigarettes    Start date: 10/30/1973   Smokeless tobacco: Never  Vaping Use   Vaping Use: Never used  Substance and Sexual Activity   Alcohol use: Yes    Alcohol/week: 0.0 standard drinks of alcohol    Comment: One drink every 6 months.   Drug use: No   Sexual activity: Yes  Other Topics Concern   Not on file  Social History Narrative   Not on file   Social Determinants of Health   Financial Resource Strain: Not on file  Food Insecurity: Not on file  Transportation Needs: Not on file  Physical Activity: Not on file  Stress: Not on file  Social Connections: Not on file   Family History  Problem Relation Age of Onset   Hypertension Mother    Diabetes Mother    Heart attack Mother    Hypertension Father    Heart attack Father    Heart attack Brother    Colon cancer Neg Hx    Outpatient Encounter Medications as of 09/18/2022  Medication Sig   cholecalciferol (VITAMIN  D) 1000 UNITS tablet Take 1,000 Units by mouth every evening.   Continuous Blood Gluc Sensor (FREESTYLE LIBRE 3 SENSOR) MISC Place 1 sensor on the skin every 14 days. Use to check glucose continuously   dexamethasone (DECADRON) 1 MG tablet Take 1 tablet (1 mg total) by mouth daily with breakfast.   docusate sodium (COLACE) 100 MG capsule Take 100 mg by mouth daily.   folic acid (FOLVITE) 1 MG tablet Take 1 tablet by mouth daily.   Krill Oil 300 MG CAPS Take 300 mg by mouth every evening.   levothyroxine (SYNTHROID) 75 MCG tablet Take 1 tablet (75 mcg total) by mouth daily before breakfast.   loratadine (CLARITIN) 10 MG tablet Take 10 mg by mouth every evening.    losartan (COZAAR) 50 MG tablet Take 1 and 1/2 tablets (75 mg total) by mouth daily.   mirtazapine (REMERON) 15 MG tablet Take 1 tablet by mouth at bedtime.   Multiple Vitamins-Minerals (MULTIVITAMIN GUMMIES ADULT PO) Take 2 each by mouth daily.   pantoprazole (PROTONIX) 40 MG  tablet Take 1 tablet by mouth every evening.   PRESCRIPTION MEDICATION Testosterone injection   prochlorperazine (COMPAZINE) 10 MG tablet Take 1 tablet (10 mg total) by mouth every 6 (six) hours as needed for nausea or vomiting.   rosuvastatin (CRESTOR) 20 MG tablet Take 1 tablet (20 mg total) by mouth daily.   Semaglutide,0.25 or 0.5MG /DOS, (OZEMPIC, 0.25 OR 0.5 MG/DOSE,) 2 MG/3ML SOPN Inject 0.5 mg into the skin once a week.   SYRINGE-NEEDLE, DISP, 3 ML 21G X 1-1/2" 3 ML MISC Use to inject testosterone every week   testosterone cypionate (DEPOTESTOTERONE CYPIONATE) 100 MG/ML injection Take 50mg  ( 0.19ml) and 100mg  ( 45ml) into muscle alternatively weekly.   venlafaxine XR (EFFEXOR-XR) 75 MG 24 hr capsule Take 1 capsule (75 mg total) by mouth daily with breakfast.   [DISCONTINUED] dexamethasone (DECADRON) 1 MG tablet Take 2 tablets by mouth daily.   [DISCONTINUED] levothyroxine (SYNTHROID) 50 MCG tablet Take 1 tablet (50 mcg total) by mouth daily before breakfast.   [DISCONTINUED] testosterone cypionate (DEPOTESTOTERONE CYPIONATE) 100 MG/ML injection Inject 0.5 mLs (50 mg total) into the muscle once a week. For IM use only.   No facility-administered encounter medications on file as of 09/18/2022.   ALLERGIES: No Known Allergies  VACCINATION STATUS: Immunization History  Administered Date(s) Administered   Fluad Quad(high Dose 65+) 07/28/2022   Hepatitis B 02/06/1990, 10/28/1990, 04/02/1991   Influenza,inj,Quad PF,6+ Mos 07/07/2019, 07/31/2021   Influenza-Unspecified 07/25/2018   Moderna Sars-Covid-2 Vaccination 12/01/2019, 12/30/2019, 10/19/2020   Pneumococcal Conjugate-13 03/11/2017   Tdap 01/17/2016    HPI Bradley Goodman is 66 y.o. male who presents today for follow-up after he was seen in consultation for multiple medical problems as follows.  He notes from previous visits. History is obtained directly from the patient as well as chart review. He is accompanied by his wife.  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  He is status post radiosurgery of CNS metastasis and chemotherapy.  His chemotherapy in the involved high-dose steroids in the form of dexamethasone, currently on dexamethasone 2 mg p.o. daily.     During this process, he developed fatigue, low libido, and loss of skeletal muscle mass.  His labs showed hypogonadism, partial adrenal insufficiency, and hypothyroidism.  For this endocrine deficits, he was started on testosterone injection 50 mg IM every week.  His previsit total testosterone is still suboptimal.  His previsit thyroid function tests are consistent with under replacement.   He denies prior testicular injury.  He denies testicular radiation . He wishes to be continued on testosterone replacement therapy as well as his other hormones.  Continues to gain weight progressively.  He continues to smoke. He is back to enjoy his outdoor activities including golfing.    Review of Systems  Constitutional: + Stable weight,   + fatigue, + subjective hypothermia, + low libido  Objective:       09/18/2022    8:51 AM 09/03/2022    2:58 PM 09/03/2022   12:29 PM  Vitals with BMI  Height 5' 11.5"    Weight 197 lbs 13 oz  197 lbs 2 oz  BMI 78.24    Systolic 235 361 443  Diastolic 154 98 87  Pulse 96 93 103    BP (!) 158/100 Comment: takes BP medication at night  Pulse 96   Ht 5' 11.5" (1.816 m)   Wt 197 lb 12.8 oz (89.7 kg)   BMI 27.20 kg/m   Wt Readings from Last 3 Encounters:  09/18/22 197 lb 12.8 oz (89.7 kg)  09/03/22 197 lb 1.6 oz (89.4 kg)  08/13/22 194 lb 4.8 oz (88.1 kg)    Physical Exam  Constitutional:  Body mass index is 27.2 kg/m.,  not in acute distress, normal state of mind Eyes: PERRLA, EOMI, no exophthalmos ENT: moist mucous membranes, no gross thyromegaly, no gross cervical lymphadenopathy   CMP ( most recent) CMP     Component Value Date/Time   NA 144  09/07/2022 0854   NA 144 01/10/2021 0849   K 3.6 09/07/2022 0854   CL 106 09/07/2022 0854   CO2 27 09/07/2022 0854   GLUCOSE 113 (H) 09/07/2022 0854   BUN 16 09/07/2022 0854   BUN 15 01/10/2021 0849   CREATININE 1.07 09/07/2022 0854   CREATININE 1.16 09/03/2022 1207   CALCIUM 8.9 09/07/2022 0854   PROT 6.8 09/07/2022 0854   PROT 6.3 01/10/2021 0849   ALBUMIN 3.5 09/07/2022 0854   ALBUMIN 4.2 01/10/2021 0849   AST 27 09/07/2022 0854   AST 18 09/03/2022 1207   ALT 32 09/07/2022 0854   ALT 23 09/03/2022 1207   ALKPHOS 66 09/07/2022 0854   BILITOT 0.3 09/07/2022 0854   BILITOT 0.3 09/03/2022 1207   GFRNONAA >60 09/07/2022 0854   GFRNONAA >60 09/03/2022 1207   GFRAA 82 07/14/2020 0945     Diabetic Labs (most recent): Lab Results  Component Value Date   HGBA1C 6.6 (H) 07/31/2022   HGBA1C 6.6 (H) 04/17/2022   HGBA1C 7.5 (H) 01/10/2022   MICROALBUR neg 07/13/2014     Lipid Panel ( most recent) Lipid Panel     Component Value Date/Time   CHOL 112 07/31/2022 0821   TRIG 214 (H) 07/31/2022 0821   TRIG 87 10/18/2016 0851   HDL 38 (L) 07/31/2022 0821   HDL 37 (L) 10/18/2016 0851   CHOLHDL 2.9 07/31/2022 0821   LDLCALC 40 07/31/2022 0821   LDLCALC 53 07/13/2014 0925   LABVLDL 34 07/31/2022 0821      Lab Results  Component Value Date   TSH 1.053 09/07/2022   TSH 0.751 09/03/2022   TSH 0.705 08/13/2022   TSH 1.076 07/23/2022   TSH 1.331 07/02/2022   TSH 0.858 06/12/2022   TSH 1.289 06/04/2022   TSH 0.969 05/21/2022   TSH 1.014 04/30/2022   TSH 0.597 04/09/2022   FREET4 0.60 (L) 09/07/2022  FREET4 0.76 06/04/2022   FREET4 0.58 (L) 01/22/2022   FREET4 0.75 09/22/2021      Latest Reference Range & Units 01/22/22 08:18 01/22/22 08:20  Sex Horm Binding Glob, Serum 19.3 - 76.4 nmol/L  16.7 (L)  Testosterone 264 - 916 ng/dL  234 (L)  Testosterone Free 6.6 - 18.1 pg/mL  4.7 (L)  Testosterone-% Free 0.2 - 0.7 %  1.9 (H)  TSH 0.350 - 4.500 uIU/mL 1.858    Triiodothyronine,Free,Serum 2.0 - 4.4 pg/mL 2.0   T4,Free(Direct) 0.61 - 1.12 ng/dL 0.58 (L)   (L): Data is abnormally low (H): Data is abnormally high  Assessment & Plan:   1. Hypopituitarism  2.  Hypogonadism 3.hypothyroidism 4.partial adrenal insufficiency -He is accompanied by his wife to clinic.  I discussed his recent findings and previous lab work with him.  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 -He will be considered for lower dose of Decadron to avoid side effects of excessive steroid replacement.  I discussed and lowered his Decadron to 1 mg p.o. daily at breakfast.     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  His previsit thyroid function tests are consistent with under replacement.  I discussed and increase his levothyroxine to 75 mcg p.o. daily before breakfast.     - We discussed about the correct intake of his thyroid hormone, on empty stomach at fasting, with water, separated by at least 30 minutes from breakfast and other medications,  and separated by more than 4 hours from calcium, iron, multivitamins, acid reflux medications (PPIs). -Patient is made aware of the fact that thyroid hormone replacement is needed for life, dose to be adjusted by periodic monitoring of thyroid function tests.  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  I discussed and increased his testosterone cypionate IM to 50 mg alternating with  100 mg intramuscularly weekly until next measurement.  This will give him a total of 300 mg of testosterone monthly.    -Treatment target for him will be between 250-350 ng per DL, with the aim being to help him with libido as well as possibly avoid further loss of skeletal muscle mass.  Regarding his type 2 diabetes, his recent A1c was  6.6%.   He is advised to continue Ozempic 0.5 mg subcutaneously weekly.  He was taken off of metformin recently.   The patient was counseled on the dangers of tobacco use, and was advised to quit.  Reviewed strategies to maximize success, including removing cigarettes and smoking materials from environment.   - he is advised to maintain close follow up with his oncologist, neurologist, radiologist and PCP Bradley Gouty, FNP for primary care needs.    I spent 26 minutes in the care of the patient today including review of labs from Thyroid Function, CMP, and other relevant labs ; imaging/biopsy records (current and previous including abstractions from other facilities); face-to-face time discussing  his lab results and symptoms, medications doses, his options of short and long term treatment based on the latest standards of care / guidelines;   and documenting the encounter.  Ernesta Amble Juul  participated in the discussions, expressed understanding, and voiced agreement with the above plans.  All questions were answered to his satisfaction. he is encouraged to contact clinic should he have any questions or concerns prior to his return visit.  Follow up plan: Return in about 3 months (around 12/18/2022) for Fasting Labs  in AM B4 8, A1c -NV.   Glade Lloyd, MD [facility] [address] [place], [place] [address] Phone: [phone]  Fax: [phone]     09/18/2022, 1:05 PM  This note was partially dictated with voice recognition software. Similar sounding words can be transcribed inadequately or may not  be corrected upon review.

## 2022-09-19 ENCOUNTER — Other Ambulatory Visit: Payer: Self-pay

## 2022-09-20 ENCOUNTER — Encounter: Payer: Self-pay | Admitting: Internal Medicine

## 2022-09-22 ENCOUNTER — Encounter: Payer: Self-pay | Admitting: Internal Medicine

## 2022-09-24 ENCOUNTER — Inpatient Hospital Stay: Payer: 59 | Attending: Physician Assistant

## 2022-09-24 ENCOUNTER — Inpatient Hospital Stay (HOSPITAL_BASED_OUTPATIENT_CLINIC_OR_DEPARTMENT_OTHER): Payer: 59 | Admitting: Internal Medicine

## 2022-09-24 ENCOUNTER — Inpatient Hospital Stay: Payer: 59

## 2022-09-24 VITALS — HR 100

## 2022-09-24 VITALS — BP 147/89 | HR 109 | Temp 97.3°F | Resp 18 | Wt 194.2 lb

## 2022-09-24 DIAGNOSIS — Z5112 Encounter for antineoplastic immunotherapy: Secondary | ICD-10-CM | POA: Diagnosis present

## 2022-09-24 DIAGNOSIS — C3490 Malignant neoplasm of unspecified part of unspecified bronchus or lung: Secondary | ICD-10-CM

## 2022-09-24 DIAGNOSIS — C7951 Secondary malignant neoplasm of bone: Secondary | ICD-10-CM | POA: Diagnosis not present

## 2022-09-24 DIAGNOSIS — I10 Essential (primary) hypertension: Secondary | ICD-10-CM | POA: Insufficient documentation

## 2022-09-24 DIAGNOSIS — C3491 Malignant neoplasm of unspecified part of right bronchus or lung: Secondary | ICD-10-CM

## 2022-09-24 DIAGNOSIS — Z5111 Encounter for antineoplastic chemotherapy: Secondary | ICD-10-CM | POA: Diagnosis present

## 2022-09-24 DIAGNOSIS — Z79899 Other long term (current) drug therapy: Secondary | ICD-10-CM | POA: Insufficient documentation

## 2022-09-24 DIAGNOSIS — Z923 Personal history of irradiation: Secondary | ICD-10-CM | POA: Diagnosis not present

## 2022-09-24 DIAGNOSIS — C3411 Malignant neoplasm of upper lobe, right bronchus or lung: Secondary | ICD-10-CM | POA: Diagnosis present

## 2022-09-24 DIAGNOSIS — F17210 Nicotine dependence, cigarettes, uncomplicated: Secondary | ICD-10-CM | POA: Insufficient documentation

## 2022-09-24 DIAGNOSIS — C7931 Secondary malignant neoplasm of brain: Secondary | ICD-10-CM | POA: Diagnosis not present

## 2022-09-24 LAB — CBC WITH DIFFERENTIAL (CANCER CENTER ONLY)
Abs Immature Granulocytes: 0.09 10*3/uL — ABNORMAL HIGH (ref 0.00–0.07)
Basophils Absolute: 0.1 10*3/uL (ref 0.0–0.1)
Basophils Relative: 1 %
Eosinophils Absolute: 0 10*3/uL (ref 0.0–0.5)
Eosinophils Relative: 0 %
HCT: 44 % (ref 39.0–52.0)
Hemoglobin: 13.5 g/dL (ref 13.0–17.0)
Immature Granulocytes: 1 %
Lymphocytes Relative: 9 %
Lymphs Abs: 1 10*3/uL (ref 0.7–4.0)
MCH: 27.7 pg (ref 26.0–34.0)
MCHC: 30.7 g/dL (ref 30.0–36.0)
MCV: 90.2 fL (ref 80.0–100.0)
Monocytes Absolute: 0.4 10*3/uL (ref 0.1–1.0)
Monocytes Relative: 3 %
Neutro Abs: 9.6 10*3/uL — ABNORMAL HIGH (ref 1.7–7.7)
Neutrophils Relative %: 86 %
Platelet Count: 215 10*3/uL (ref 150–400)
RBC: 4.88 MIL/uL (ref 4.22–5.81)
RDW: 17.7 % — ABNORMAL HIGH (ref 11.5–15.5)
WBC Count: 11.1 10*3/uL — ABNORMAL HIGH (ref 4.0–10.5)
nRBC: 0 % (ref 0.0–0.2)

## 2022-09-24 LAB — CMP (CANCER CENTER ONLY)
ALT: 32 U/L (ref 0–44)
AST: 25 U/L (ref 15–41)
Albumin: 4.1 g/dL (ref 3.5–5.0)
Alkaline Phosphatase: 73 U/L (ref 38–126)
Anion gap: 8 (ref 5–15)
BUN: 11 mg/dL (ref 8–23)
CO2: 27 mmol/L (ref 22–32)
Calcium: 9.6 mg/dL (ref 8.9–10.3)
Chloride: 107 mmol/L (ref 98–111)
Creatinine: 1.2 mg/dL (ref 0.61–1.24)
GFR, Estimated: >60 mL/min (ref >60)
Glucose, Bld: 140 mg/dL — ABNORMAL HIGH (ref 70–99)
Potassium: 3.7 mmol/L (ref 3.5–5.1)
Sodium: 142 mmol/L (ref 135–145)
Total Bilirubin: 0.3 mg/dL (ref 0.3–1.2)
Total Protein: 6.6 g/dL (ref 6.5–8.1)

## 2022-09-24 LAB — TSH: TSH: 0.883 u[IU]/mL (ref 0.350–4.500)

## 2022-09-24 MED ORDER — SODIUM CHLORIDE 0.9 % IV SOLN
500.0000 mg/m2 | Freq: Once | INTRAVENOUS | Status: AC
  Administered 2022-09-24: 1000 mg via INTRAVENOUS
  Filled 2022-09-24: qty 40

## 2022-09-24 MED ORDER — SODIUM CHLORIDE 0.9 % IV SOLN: Freq: Once | INTRAVENOUS | Status: AC

## 2022-09-24 MED ORDER — PROCHLORPERAZINE MALEATE 10 MG PO TABS
10.0000 mg | ORAL_TABLET | Freq: Once | ORAL | Status: AC
  Administered 2022-09-24: 10 mg via ORAL
  Filled 2022-09-24: qty 1

## 2022-09-24 MED ORDER — SODIUM CHLORIDE 0.9 % IV SOLN
200.0000 mg | Freq: Once | INTRAVENOUS | Status: AC
  Administered 2022-09-24: 200 mg via INTRAVENOUS
  Filled 2022-09-24: qty 200

## 2022-09-24 NOTE — Progress Notes (Signed)
[facility] Telephone:([phone]) [phone]   Fax:(336) [phone]  OFFICE PROGRESS NOTE  [name], [place] [address]  DIAGNOSIS: Stage IV (T1b, N3, M1C) non-small cell lung cancer, favoring adenocarcinoma presented with right upper lobe lung nodule in addition to right hilar, subcarinal and bilateral mediastinal as well as supraclavicular lymphadenopathy in addition to bone and brain metastasis diagnosed in June 2022.     PD-L1 expression 80%.     Molecular Studies:  Biomarker Findings Microsatellite status - MS-Stable Tumor Mutational Burden - 6 Muts/Mb Genomic Findings For a complete list of the genes assayed, please refer to the Appendix. KRAS G12C, amplification ATM S470* CCND1 amplification - equivocal? HGF amplification - equivocal? MYC amplification - equivocal? FGF19 amplification - equivocal? FGF3 amplification - equivocal? FGF4 amplification - equivocal? NFKBIA amplification NKX2-1 amplification RAD21 amplification - equivocal? [id number]*26 TERT promoter -124C>T TP53 rearrangement exon 9 7 Disease relevant genes with no reportable alterations: ALK, BRAF, EGFR, ERBB2, MET, RET, ROS1   PRIOR THERAPY: SRS to the metastatic brain lesions under the care of Dr. MTammi Klippel  Last treatment on 05/04/2021.   CURRENT THERAPY: Palliative systemic chemotherapy with carboplatin for an AUC 5, Alimta 500 mg/m2 and, Keytruda 200 mg IV every 3 weeks.  First dose expected on 05/08/2021.  Status post 24 cycles.  Starting from cycle #5 he is on maintenance treatment with Alimta and Keytruda every 3 weeks.  INTERVAL HISTORY: Bradley Goodman 66y.o. male returns to the clinic today for follow-up visit accompanied by his wife.  The patient is feeling fine today with no concerning complaints except for the increased lacrimation of his eyes likely secondary to treatment with Alimta.  He denied having any current chest pain, shortness of breath,  cough or hemoptysis.  He has no nausea, vomiting, diarrhea or constipation.  He has no headache or visual changes.  He denied having any significant weight loss or night sweats.  He continues to tolerate his treatment with maintenance Alimta and Keytruda fairly well.  He is here today for evaluation before starting cycle #25.  MEDICAL HISTORY: Past Medical History:  Diagnosis Date   Cervical spondylolysis    Essential hypertension    GERD (gastroesophageal reflux disease)    History of kidney stones    History of migraine    Hyperlipidemia    Hypertension    PONV (postoperative nausea and vomiting)    Type 2 diabetes mellitus ([facility])     ALLERGIES:  has No Known Allergies.  MEDICATIONS:  Current Outpatient Medications  Medication Sig Dispense Refill   cholecalciferol (VITAMIN D) 1000 UNITS tablet Take 1,000 Units by mouth every evening.     Continuous Blood Gluc Sensor (FREESTYLE LIBRE 3 SENSOR) MISC Place 1 sensor on the skin every 14 days. Use to check glucose continuously 2 each 2   dexamethasone (DECADRON) 1 MG tablet Take 1 tablet (1 mg total) by mouth daily with breakfast. 90 tablet 1   docusate sodium (COLACE) 100 MG capsule Take 100 mg by mouth daily.     folic acid (FOLVITE) 1 MG tablet Take 1 tablet by mouth daily. 90 tablet 4   Krill Oil 300 MG CAPS Take 300 mg by mouth every evening.     levothyroxine (SYNTHROID) 75 MCG tablet Take 1 tablet (75 mcg total) by mouth daily before breakfast. 90 tablet 1   loratadine (CLARITIN) 10 MG tablet Take 10 mg by mouth every evening.  losartan (COZAAR) 50 MG tablet Take 1 and 1/2 tablets (75 mg total) by mouth daily. 135 tablet 1   mirtazapine (REMERON) 15 MG tablet Take 1 tablet by mouth at bedtime. 90 tablet 1   Multiple Vitamins-Minerals (MULTIVITAMIN GUMMIES ADULT PO) Take 2 each by mouth daily.     pantoprazole (PROTONIX) 40 MG tablet Take 1 tablet by mouth every evening. 90 tablet 1   PRESCRIPTION MEDICATION Testosterone  injection     prochlorperazine (COMPAZINE) 10 MG tablet Take 1 tablet (10 mg total) by mouth every 6 (six) hours as needed for nausea or vomiting. 30 tablet 0   rosuvastatin (CRESTOR) 20 MG tablet Take 1 tablet (20 mg total) by mouth daily. 90 tablet 1   Semaglutide,0.25 or 0.5MG/DOS, (OZEMPIC, 0.25 OR 0.5 MG/DOSE,) 2 MG/3ML SOPN Inject 0.5 mg into the skin once a week. 9 mL 0   SYRINGE-NEEDLE, DISP, 3 ML 21G X 1-1/2" 3 ML MISC Use to inject testosterone every week 100 each 2   testosterone cypionate (DEPOTESTOTERONE CYPIONATE) 100 MG/ML injection Take 81m ( 0.531m and 10063m 1ml35mnto muscle alternatively weekly. 10 mL 0   venlafaxine XR (EFFEXOR-XR) 75 MG 24 hr capsule Take 1 capsule (75 mg total) by mouth daily with breakfast. 90 capsule 1   No current facility-administered medications for this visit.    SURGICAL HISTORY:  Past Surgical History:  Procedure Laterality Date   Bilateral inguinal hernia repair     BRONCHIAL NEEDLE ASPIRATION BIOPSY  04/05/2021   Procedure: BRONCHIAL NEEDLE ASPIRATION BIOPSIES;  Surgeon: IcarGarner Nash;  [facility];;   COLONOSCOPY  01/23/2012   Procedure: COLONOSCOPY;  Surgeon: RobeDaneil Dolin;  [facility];  Service: Endoscopy;  Laterality: N/A;  9:30 AM   COLONOSCOPY N/A 10/11/2015   Procedure: COLONOSCOPY;  Surgeon: RobeDaneil Dolin;  [facility];  Service: Endoscopy;  Laterality: N/A;  830   COLONOSCOPY N/A 10/14/2019   Procedure: COLONOSCOPY;  Surgeon: RourDaneil Dolin;  [facility];  Service: Endoscopy;  Laterality: N/A;  1:45   POLYPECTOMY  10/14/2019   Procedure: POLYPECTOMY;  Surgeon: RourDaneil Dolin;  [facility];  Service: Endoscopy;;  ascending colon, descending colon   VIDEO BRONCHOSCOPY WITH ENDOBRONCHIAL ULTRASOUND N/A 04/05/2021   Procedure: VIDEO BRONCHOSCOPY WITH ENDOBRONCHIAL ULTRASOUND;  Surgeon: IcarGarner Nash;  [facility];  Laterality: N/A;    REVIEW OF SYSTEMS:  A comprehensive review of systems was negative except for: Eyes: positive for increased lacrimation    PHYSICAL EXAMINATION: General appearance: alert, cooperative, appears stated age, and no distress Head: Normocephalic, without obvious abnormality, atraumatic Neck: no adenopathy, no JVD, supple, symmetrical, trachea midline, and thyroid not enlarged, symmetric, no tenderness/mass/nodules Lymph nodes: Cervical, supraclavicular, and axillary nodes normal. Resp: clear to auscultation bilaterally Back: symmetric, no curvature. ROM normal. No CVA tenderness. Cardio: regular rate and rhythm, S1, S2 normal, no murmur, click, rub or gallop GI: soft, non-tender; bowel sounds normal; no masses,  no organomegaly Extremities: extremities normal, atraumatic, no cyanosis or edema  ECOG PERFORMANCE STATUS: 1 - Symptomatic but completely ambulatory  Blood pressure (!) 147/89, pulse (!) 109, temperature (!) 97.3 F (36.3 C), resp. rate 18, weight 194 lb 4 oz (88.1 kg), SpO2 99 %.  LABORATORY DATA: Lab Results  Component Value Date   WBC 10.9 (H) 09/03/2022   HGB 12.2 (L) 09/03/2022   HCT 39.2 09/03/2022  MCV 91.0 09/03/2022   PLT 248 09/03/2022      Chemistry      Component Value Date/Time   NA 144 09/07/2022 0854   NA 144 01/10/2021 0849   K 3.6 09/07/2022 0854   CL 106 09/07/2022 0854   CO2 27 09/07/2022 0854   BUN 16 09/07/2022 0854   BUN 15 01/10/2021 0849   CREATININE 1.07 09/07/2022 0854   CREATININE 1.16 09/03/2022 1207      Component Value Date/Time   CALCIUM 8.9 09/07/2022 0854   ALKPHOS 66 09/07/2022 0854   AST 27 09/07/2022 0854   AST 18 09/03/2022 1207   ALT 32 09/07/2022 0854   ALT 23 09/03/2022 1207   BILITOT 0.3 09/07/2022 0854   BILITOT 0.3 09/03/2022 1207       RADIOGRAPHIC STUDIES: No results found.  ASSESSMENT AND PLAN: This is a very pleasant 66 years old white male recently diagnosed with  stage IV (T1b, N3, M1 C) non-small cell lung cancer favoring adenocarcinoma presented with right upper lobe lung nodule in addition to right hilar, subcarinal and bilateral mediastinal as well as supraclavicular lymphadenopathy.  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  His PD-L1 expression is 80% and his molecular studies showed KRAS G12C mutation. The patient underwent SRS to metastatic brain lesion under the care of Dr. Tammi Klippel and he is currently undergoing systemic chemotherapy with carboplatin for AUC of 5, Alimta 500 Mg/M2 and Keytruda 200 Mg IV every 3 weeks status post 24 cycles.  Starting from cycle #5 the patient will be treated with maintenance treatment with Alimta and Keytruda every 3 weeks. The patient has been tolerating his treatment with maintenance Alimta and Keytruda fairly well. I recommended for him to proceed with cycle #25 today as planned. I will see him back for follow-up visit in 3 weeks for evaluation with repeat CT scan of the chest, abdomen and pelvis for restaging of his disease. The patient was advised to call immediately if he has any other concerning symptoms in the interval. The patient voices understanding of current disease status and treatment options and is in agreement with the current care plan.  All questions were answered. The patient knows to call the clinic with any problems, questions or concerns. We can certainly see the patient much sooner if necessary. The total time spent in the appointment was 20 minutes.  Disclaimer: This note was dictated with voice recognition software. Similar sounding words can inadvertently be transcribed and may not be corrected upon review.

## 2022-09-24 NOTE — Patient Instructions (Signed)
[facility]  Discharge Instructions: Thank you for choosing [facility] to provide your oncology and hematology care.   If you have a lab appointment with the [facility], please go directly to the [facility] and check in at the registration area.   Wear comfortable clothing and clothing appropriate for easy access to any Portacath or PICC line.   We strive to give you quality time with your provider. You may need to reschedule your appointment if you arrive late (15 or more minutes).  Arriving late affects you and other patients whose appointments are after yours.  Also, if you miss three or more appointments without notifying the office, you may be dismissed from the clinic at the provider's discretion.      For prescription refill requests, have your pharmacy contact our office and allow 72 hours for refills to be completed.    Today you received the following chemotherapy and/or immunotherapy agents: pembrolizumab and pemetrexed      To help prevent nausea and vomiting after your treatment, we encourage you to take your nausea medication as directed.  BELOW ARE SYMPTOMS THAT SHOULD BE REPORTED IMMEDIATELY: *FEVER GREATER THAN 100.4 F (38 C) OR HIGHER *CHILLS OR SWEATING *NAUSEA AND VOMITING THAT IS NOT CONTROLLED WITH YOUR NAUSEA MEDICATION *UNUSUAL SHORTNESS OF BREATH *UNUSUAL BRUISING OR BLEEDING *URINARY PROBLEMS (pain or burning when urinating, or frequent urination) *BOWEL PROBLEMS (unusual diarrhea, constipation, pain near the anus) TENDERNESS IN MOUTH AND THROAT WITH OR WITHOUT PRESENCE OF ULCERS (sore throat, sores in mouth, or a toothache) UNUSUAL RASH, SWELLING OR PAIN  UNUSUAL VAGINAL DISCHARGE OR ITCHING   Items with * indicate a potential emergency and should be followed up as soon as possible or go to the Emergency Department if any problems should occur.  Please show the CHEMOTHERAPY ALERT CARD or IMMUNOTHERAPY ALERT  CARD at check-in to the Emergency Department and triage nurse.  Should you have questions after your visit or need to cancel or reschedule your appointment, please contact [facility]  Dept: [phone]  and follow the prompts.  Office hours are 8:00 a.m. to 4:30 p.m. Monday - Friday. Please note that voicemails left after 4:00 p.m. may not be returned until the following business day.  We are closed weekends and major holidays. You have access to a nurse at all times for urgent questions. Please call the main number to the clinic Dept: [phone] and follow the prompts.   For any non-urgent questions, you may also contact your provider using MyChart. We now offer e-Visits for anyone 26 and older to request care online for non-urgent symptoms. For details visit [email or web address].   Also download the MyChart app! Go to the app store, search "MyChart", open the app, select [facility], and log in with your MyChart username and password.  Masks are optional in the cancer centers. If you would like for your care team to wear a mask while they are taking care of you, please let them know. You may have one support Quina Wilbourne who is at least 65 years old accompany you for your appointments.

## 2022-09-25 LAB — T4: T4, Total: 8.4 ug/dL (ref 4.5–12.0)

## 2022-09-27 ENCOUNTER — Telehealth: Payer: Self-pay

## 2022-09-28 ENCOUNTER — Telehealth: Payer: Self-pay | Admitting: Family Medicine

## 2022-09-28 ENCOUNTER — Encounter: Payer: Self-pay | Admitting: Family Medicine

## 2022-09-29 ENCOUNTER — Telehealth: Payer: Self-pay | Admitting: Urgent Care

## 2022-09-29 DIAGNOSIS — Z20828 Contact with and (suspected) exposure to other viral communicable diseases: Secondary | ICD-10-CM

## 2022-09-29 DIAGNOSIS — R0981 Nasal congestion: Secondary | ICD-10-CM

## 2022-09-29 MED ORDER — IPRATROPIUM BROMIDE 0.03 % NA SOLN: 2.0000 | Freq: Two times a day (BID) | NASAL | 0 refills | Status: DC

## 2022-09-29 MED ORDER — OSELTAMIVIR PHOSPHATE 75 MG PO CAPS: 75.0000 mg | ORAL_CAPSULE | Freq: Every day | ORAL | 0 refills | Status: AC

## 2022-09-29 MED ORDER — BENZONATATE 100 MG PO CAPS: 100.0000 mg | ORAL_CAPSULE | Freq: Three times a day (TID) | ORAL | 0 refills | Status: DC | PRN

## 2022-09-29 NOTE — Progress Notes (Signed)
E-Visit for Upper Respiratory Infection   We are sorry you are not feeling well.  Here is how we plan to help!  Based on what you have shared with me, it looks like you may have a viral upper respiratory infection.  Upper respiratory infections are caused by a large number of viruses; however, rhinovirus is the most common cause.   Symptoms vary from person to person, with common symptoms including sore throat, cough, fatigue or lack of energy and feeling of general discomfort.  A low-grade fever of up to 100.4 may present, but is often uncommon.  Symptoms vary however, and are closely related to a person's age or underlying illnesses.  The most common symptoms associated with an upper respiratory infection are nasal discharge or congestion, cough, sneezing, headache and pressure in the ears and face.  These symptoms usually persist for about 3 to 10 days, but can last up to 2 weeks.  It is important to know that upper respiratory infections do not cause serious illness or complications in most cases.    Upper respiratory infections can be transmitted from person to person, with the most common method of transmission being a person's hands.  The virus is able to live on the skin and can infect other persons for up to 2 hours after direct contact.  Also, these can be transmitted when someone coughs or sneezes; thus, it is important to cover the mouth to reduce this risk.  To keep the spread of the illness at [facility], good hand hygiene is very important.  This is an infection that is most likely caused by a virus. There are no specific treatments other than to help you with the symptoms until the infection runs its course.  We are sorry you are not feeling well.  Here is how we plan to help!   For nasal congestion, you may use an oral decongestants such as Mucinex D or if you have glaucoma or high blood pressure use plain Mucinex.  Saline nasal spray or nasal drops can help and can safely be used as often as  needed for congestion.  For your congestion, I have prescribed Ipratropium Bromide nasal spray 0.03% two sprays in each nostril 2-3 times a day  If you do not have a history of heart disease, hypertension, diabetes or thyroid disease, prostate/bladder issues or glaucoma, you may also use Sudafed to treat nasal congestion.  It is highly recommended that you consult with a pharmacist or your primary care physician to ensure this medication is safe for you to take.     If you have a cough, you may use cough suppressants such as Delsym and Robitussin.  If you have glaucoma or high blood pressure, you can also use Coricidin HBP.   For cough I have prescribed for you A prescription cough medication called Tessalon Perles 100 mg. You may take 1-2 capsules every 8 hours as needed for cough   Because of your exposure to influenza, I have also called in tamiflu for you to take, 1 tab daily x 10 days.   If you have a sore or scratchy throat, use a saltwater gargle-  to  teaspoon of salt dissolved in a 4-ounce to 8-ounce glass of warm water.  Gargle the solution for approximately 15-30 seconds and then spit.  It is important not to swallow the solution.  You can also use throat lozenges/cough drops and Chloraseptic spray to help with throat pain or discomfort.  Warm or cold liquids  can also be helpful in relieving throat pain.  For headache, pain or general discomfort, you can use Ibuprofen or Tylenol as directed.   Some authorities believe that zinc sprays or the use of Echinacea may shorten the course of your symptoms.   HOME CARE Only take medications as instructed by your medical team. Be sure to drink plenty of fluids. Water is fine as well as fruit juices, sodas and electrolyte beverages. You may want to stay away from caffeine or alcohol. If you are nauseated, try taking small sips of liquids. How do you know if you are getting enough fluid? Your urine should be a pale yellow or almost colorless. Get  rest. Taking a steamy shower or using a humidifier may help nasal congestion and ease sore throat pain. You can place a towel over your head and breathe in the steam from hot water coming from a faucet. Using a saline nasal spray works much the same way. Cough drops, hard candies and sore throat lozenges may ease your cough. Avoid close contacts especially the very young and the elderly Cover your mouth if you cough or sneeze Always remember to wash your hands.   GET HELP RIGHT AWAY IF: You develop worsening fever. If your symptoms do not improve within 10 days You develop yellow or green discharge from your nose over 3 days. You have coughing fits You develop a severe head ache or visual changes. You develop shortness of breath, difficulty breathing or start having chest pain Your symptoms persist after you have completed your treatment plan  MAKE SURE YOU  Understand these instructions. Will watch your condition. Will get help right away if you are not doing well or get worse.  Thank you for choosing an e-visit.  Your e-visit answers were reviewed by a board certified advanced clinical practitioner to complete your personal care plan. Depending upon the condition, your plan could have included both over the counter or prescription medications.  Please review your pharmacy choice. Make sure the pharmacy is open so you can pick up prescription now. If there is a problem, you may contact your provider through [employer] and have the prescription routed to another pharmacy.  Your safety is important to [place]. If you have drug allergies check your prescription carefully.   For the next 24 hours you can use MyChart to ask questions about today's visit, request a non-urgent call back, or ask for a work or school excuse. You will get an email in the next two days asking about your experience. I hope that your e-visit has been valuable and will speed your recovery.      I have spent 5  minutes in review of e-visit questionnaire, review and updating patient chart, medical decision making and response to patient.   Garrison, PA

## 2022-10-02 ENCOUNTER — Other Ambulatory Visit: Payer: Self-pay | Admitting: Nurse Practitioner

## 2022-10-02 ENCOUNTER — Other Ambulatory Visit (HOSPITAL_COMMUNITY): Payer: Self-pay

## 2022-10-02 ENCOUNTER — Other Ambulatory Visit: Payer: Self-pay

## 2022-10-02 NOTE — Telephone Encounter (Signed)
Left message to return call if needed.

## 2022-10-02 NOTE — Telephone Encounter (Signed)
Tamifu was ala;ready been called in on 09/29/22

## 2022-10-03 ENCOUNTER — Other Ambulatory Visit: Payer: Self-pay | Admitting: Family Medicine

## 2022-10-03 ENCOUNTER — Other Ambulatory Visit: Payer: Self-pay

## 2022-10-03 DIAGNOSIS — E1165 Type 2 diabetes mellitus with hyperglycemia: Secondary | ICD-10-CM

## 2022-10-03 MED ORDER — FREESTYLE LIBRE 3 SENSOR MISC
1.0000 | 2 refills | Status: DC
  Filled 2022-10-03 – 2022-10-17 (×2): qty 2, 28d supply, fill #0
  Filled 2022-10-30 – 2022-11-14 (×3): qty 2, 28d supply, fill #1
  Filled 2022-12-04: qty 2, 28d supply, fill #2

## 2022-10-11 ENCOUNTER — Ambulatory Visit (HOSPITAL_COMMUNITY)
Admission: RE | Admit: 2022-10-11 | Discharge: 2022-10-11 | Disposition: A | Discharge status: Home / Self Care | Payer: Commercial Managed Care - PPO | Source: Ambulatory Visit | Attending: Internal Medicine | Admitting: Internal Medicine

## 2022-10-11 ENCOUNTER — Other Ambulatory Visit (HOSPITAL_COMMUNITY): Payer: Self-pay

## 2022-10-11 ENCOUNTER — Encounter (HOSPITAL_COMMUNITY): Payer: Self-pay

## 2022-10-11 DIAGNOSIS — C3490 Malignant neoplasm of unspecified part of unspecified bronchus or lung: Secondary | ICD-10-CM | POA: Insufficient documentation

## 2022-10-11 DIAGNOSIS — J439 Emphysema, unspecified: Secondary | ICD-10-CM | POA: Diagnosis not present

## 2022-10-11 DIAGNOSIS — N200 Calculus of kidney: Secondary | ICD-10-CM | POA: Diagnosis not present

## 2022-10-11 DIAGNOSIS — R918 Other nonspecific abnormal finding of lung field: Secondary | ICD-10-CM | POA: Diagnosis not present

## 2022-10-11 MED ORDER — IOHEXOL 300 MG/ML  SOLN
100.0000 mL | Freq: Once | INTRAMUSCULAR | Status: AC | PRN
  Administered 2022-10-11: 100 mL via INTRAVENOUS

## 2022-10-11 MED ORDER — SODIUM CHLORIDE (PF) 0.9 % IJ SOLN
INTRAMUSCULAR | Status: AC
  Filled 2022-10-11: qty 50

## 2022-10-15 ENCOUNTER — Telehealth: Payer: Self-pay | Admitting: Medical Oncology

## 2022-10-15 ENCOUNTER — Inpatient Hospital Stay: Payer: Commercial Managed Care - PPO | Attending: Internal Medicine | Admitting: Internal Medicine

## 2022-10-15 ENCOUNTER — Inpatient Hospital Stay: Payer: Commercial Managed Care - PPO

## 2022-10-15 ENCOUNTER — Other Ambulatory Visit: Payer: Self-pay

## 2022-10-15 VITALS — BP 131/86 | HR 107 | Temp 98.6°F | Resp 17 | Wt 194.2 lb

## 2022-10-15 DIAGNOSIS — Z923 Personal history of irradiation: Secondary | ICD-10-CM | POA: Diagnosis not present

## 2022-10-15 DIAGNOSIS — C7931 Secondary malignant neoplasm of brain: Secondary | ICD-10-CM | POA: Diagnosis not present

## 2022-10-15 DIAGNOSIS — C3491 Malignant neoplasm of unspecified part of right bronchus or lung: Secondary | ICD-10-CM

## 2022-10-15 DIAGNOSIS — C7951 Secondary malignant neoplasm of bone: Secondary | ICD-10-CM | POA: Insufficient documentation

## 2022-10-15 DIAGNOSIS — Z5111 Encounter for antineoplastic chemotherapy: Secondary | ICD-10-CM | POA: Insufficient documentation

## 2022-10-15 DIAGNOSIS — C3411 Malignant neoplasm of upper lobe, right bronchus or lung: Secondary | ICD-10-CM | POA: Insufficient documentation

## 2022-10-15 DIAGNOSIS — F17210 Nicotine dependence, cigarettes, uncomplicated: Secondary | ICD-10-CM | POA: Insufficient documentation

## 2022-10-15 DIAGNOSIS — I10 Essential (primary) hypertension: Secondary | ICD-10-CM | POA: Diagnosis not present

## 2022-10-15 DIAGNOSIS — Z5112 Encounter for antineoplastic immunotherapy: Secondary | ICD-10-CM | POA: Diagnosis not present

## 2022-10-15 LAB — CBC WITH DIFFERENTIAL (CANCER CENTER ONLY)
Abs Immature Granulocytes: 0.06 10*3/uL (ref 0.00–0.07)
Basophils Absolute: 0.1 10*3/uL (ref 0.0–0.1)
Basophils Relative: 1 %
Eosinophils Absolute: 0.1 10*3/uL (ref 0.0–0.5)
Eosinophils Relative: 1 %
HCT: 39.1 % (ref 39.0–52.0)
Hemoglobin: 12.3 g/dL — ABNORMAL LOW (ref 13.0–17.0)
Immature Granulocytes: 1 %
Lymphocytes Relative: 17 %
Lymphs Abs: 1.1 10*3/uL (ref 0.7–4.0)
MCH: 27.8 pg (ref 26.0–34.0)
MCHC: 31.5 g/dL (ref 30.0–36.0)
MCV: 88.3 fL (ref 80.0–100.0)
Monocytes Absolute: 0.7 10*3/uL (ref 0.1–1.0)
Monocytes Relative: 11 %
Neutro Abs: 4.6 10*3/uL (ref 1.7–7.7)
Neutrophils Relative %: 69 %
Platelet Count: 325 10*3/uL (ref 150–400)
RBC: 4.43 MIL/uL (ref 4.22–5.81)
RDW: 18 % — ABNORMAL HIGH (ref 11.5–15.5)
WBC Count: 6.6 10*3/uL (ref 4.0–10.5)
nRBC: 0 % (ref 0.0–0.2)

## 2022-10-15 LAB — CMP (CANCER CENTER ONLY)
ALT: 44 U/L (ref 0–44)
AST: 40 U/L (ref 15–41)
Albumin: 3.8 g/dL (ref 3.5–5.0)
Alkaline Phosphatase: 63 U/L (ref 38–126)
Anion gap: 7 (ref 5–15)
BUN: 15 mg/dL (ref 8–23)
CO2: 28 mmol/L (ref 22–32)
Calcium: 8.7 mg/dL — ABNORMAL LOW (ref 8.9–10.3)
Chloride: 104 mmol/L (ref 98–111)
Creatinine: 1.1 mg/dL (ref 0.61–1.24)
GFR, Estimated: >60 mL/min (ref >60)
Glucose, Bld: 82 mg/dL (ref 70–99)
Potassium: 3.7 mmol/L (ref 3.5–5.1)
Sodium: 139 mmol/L (ref 135–145)
Total Bilirubin: 0.3 mg/dL (ref 0.3–1.2)
Total Protein: 6.1 g/dL — ABNORMAL LOW (ref 6.5–8.1)

## 2022-10-15 NOTE — Telephone Encounter (Signed)
Will be late today due to nausea and vomiting yesterday .

## 2022-10-15 NOTE — Progress Notes (Signed)
[facility] [phone]   [phone]  OFFICE PROGRESS NOTE  Bradley Goodman, [place] [address]  DIAGNOSIS: Stage IV (T1b, N3, M1C) non-small cell lung cancer, favoring adenocarcinoma presented with right upper lobe lung nodule in addition to right hilar, subcarinal and bilateral mediastinal as well as supraclavicular lymphadenopathy in addition to bone and brain metastasis diagnosed in June 2022.     PD-L1 expression 80%.     Molecular Studies:  Biomarker Findings Microsatellite status - MS-Stable Tumor Mutational Burden - 6 Muts/Mb Genomic Findings For a complete list of the genes assayed, please refer to the Appendix. KRAS G12C, amplification ATM S470* CCND1 amplification - equivocal? HGF amplification - equivocal? MYC amplification - equivocal? FGF19 amplification - equivocal? FGF3 amplification - equivocal? FGF4 amplification - equivocal? NFKBIA amplification NKX2-1 amplification RAD21 amplification - equivocal? [id number]*26 TERT promoter -124C>T TP53 rearrangement exon 9 7 Disease relevant genes with no reportable alterations: ALK, BRAF, EGFR, ERBB2, MET, RET, ROS1   PRIOR THERAPY: SRS to the metastatic brain lesions under the care of Dr. Tammi Klippel.  Last treatment on 05/04/2021.   CURRENT THERAPY: Palliative systemic chemotherapy with carboplatin for an AUC 5, Alimta 500 mg/m2 and, Keytruda 200 mg IV every 3 weeks.  First dose expected on 05/08/2021.  Status post 25 cycles.  Starting from cycle #5 he is on maintenance treatment with Alimta and Keytruda every 3 weeks.  INTERVAL HISTORY: Bradley Goodman 67 y.o. male returns to the clinic today for follow-up visit accompanied by his wife.  The patient has few episodes of nausea, vomiting and diarrhea started yesterday.  He thinks that he has a viral gastroenteritis because he was sick during the holiday.  He denied having any current chest pain, shortness  of breath, cough or hemoptysis.  He has no fever or chills.  He has no abdominal pain.  He denied having any significant weight loss or night sweats.  He has been tolerating his treatment with systemic chemotherapy fairly well.  He had repeat CT scan of the chest, abdomen pelvis performed recently and is here for evaluation and discussion of his scan results.  MEDICAL HISTORY: Past Medical History:  Diagnosis Date   Cervical spondylolysis    Essential hypertension    GERD (gastroesophageal reflux disease)    History of kidney stones    History of migraine    Hyperlipidemia    Hypertension    PONV (postoperative nausea and vomiting)    Type 2 diabetes mellitus (HCC)     ALLERGIES:  has No Known Allergies.  MEDICATIONS:  Current Outpatient Medications  Medication Sig Dispense Refill   benzonatate (TESSALON) 100 MG capsule Take 1 capsule (100 mg total) by mouth 3 (three) times daily as needed for cough. 20 capsule 0   cholecalciferol (VITAMIN D) 1000 UNITS tablet Take 1,000 Units by mouth every evening.     Continuous Blood Gluc Sensor (FREESTYLE LIBRE 3 SENSOR) MISC Place 1 sensor on the skin every 14 days. Use to check glucose continuously 2 each 2   dexamethasone (DECADRON) 1 MG tablet Take 1 tablet (1 mg total) by mouth daily with breakfast. 90 tablet 1   docusate sodium (COLACE) 100 MG capsule Take 100 mg by mouth daily.     folic acid (FOLVITE) 1 MG tablet Take 1 tablet by mouth daily. 90 tablet 4   ipratropium (ATROVENT) 0.03 % nasal spray Place 2 sprays into both nostrils every 12 (twelve) hours.  30 mL 0   Krill Oil 300 MG CAPS Take 300 mg by mouth every evening.     levothyroxine (SYNTHROID) 75 MCG tablet Take 1 tablet (75 mcg total) by mouth daily before breakfast. 90 tablet 1   loratadine (CLARITIN) 10 MG tablet Take 10 mg by mouth every evening.      losartan (COZAAR) 50 MG tablet Take 1 and 1/2 tablets (75 mg total) by mouth daily. 135 tablet 1   mirtazapine (REMERON) 15 MG  tablet Take 1 tablet by mouth at bedtime. 90 tablet 1   Multiple Vitamins-Minerals (MULTIVITAMIN GUMMIES ADULT PO) Take 2 each by mouth daily.     pantoprazole (PROTONIX) 40 MG tablet Take 1 tablet by mouth every evening. 90 tablet 1   PRESCRIPTION MEDICATION Testosterone injection     prochlorperazine (COMPAZINE) 10 MG tablet Take 1 tablet (10 mg total) by mouth every 6 (six) hours as needed for nausea or vomiting. 30 tablet 0   rosuvastatin (CRESTOR) 20 MG tablet Take 1 tablet (20 mg total) by mouth daily. 90 tablet 1   Semaglutide,0.25 or 0.5MG /DOS, (OZEMPIC, 0.25 OR 0.5 MG/DOSE,) 2 MG/3ML SOPN Inject 0.5 mg into the skin once a week. 9 mL 0   SYRINGE-NEEDLE, DISP, 3 ML 21G X 1-1/2" 3 ML MISC Use to inject testosterone every week 100 each 2   testosterone cypionate (DEPOTESTOTERONE CYPIONATE) 100 MG/ML injection Take 50mg  ( 0.36ml) and 100mg  ( 17ml) into muscle alternatively weekly. 10 mL 0   venlafaxine XR (EFFEXOR-XR) 75 MG 24 hr capsule Take 1 capsule (75 mg total) by mouth daily with breakfast. 90 capsule 1   No current facility-administered medications for this visit.    SURGICAL HISTORY:  Past Surgical History:  Procedure Laterality Date   Bilateral inguinal hernia repair     BRONCHIAL NEEDLE ASPIRATION BIOPSY  04/05/2021   Procedure: BRONCHIAL NEEDLE ASPIRATION BIOPSIES;  Surgeon: Garner Nash, DO;  Location: [facility];  Service: Pulmonary;;   COLONOSCOPY  01/23/2012   Procedure: COLONOSCOPY;  Surgeon: Daneil Dolin, MD;  [facility];  Service: Endoscopy;  Laterality: N/A;  9:30 AM   COLONOSCOPY N/A 10/11/2015   Procedure: COLONOSCOPY;  Surgeon: Daneil Dolin, MD;  [facility];  Service: Endoscopy;  Laterality: N/A;  830   COLONOSCOPY N/A 10/14/2019   Procedure: COLONOSCOPY;  Surgeon: Daneil Dolin, MD;  [facility];  Service: Endoscopy;  Laterality: N/A;  1:45   POLYPECTOMY  10/14/2019   Procedure: POLYPECTOMY;  Surgeon: Daneil Dolin, MD;   [facility];  Service: Endoscopy;;  ascending colon, descending colon   VIDEO BRONCHOSCOPY WITH ENDOBRONCHIAL ULTRASOUND N/A 04/05/2021   Procedure: VIDEO BRONCHOSCOPY WITH ENDOBRONCHIAL ULTRASOUND;  Surgeon: Garner Nash, DO;  Location: [facility];  Service: Pulmonary;  Laterality: N/A;    REVIEW OF SYSTEMS:  Constitutional: positive for fatigue Eyes: negative Ears, nose, mouth, throat, and face: negative Respiratory: negative Cardiovascular: negative Gastrointestinal: positive for diarrhea, nausea, and vomiting Genitourinary:negative Integument/breast: negative Hematologic/lymphatic: negative Musculoskeletal:negative Neurological: negative Behavioral/Psych: negative Endocrine: negative Allergic/Immunologic: negative   PHYSICAL EXAMINATION: General appearance: alert, cooperative, appears stated age, and no distress Head: Normocephalic, without obvious abnormality, atraumatic Neck: no adenopathy, no JVD, supple, symmetrical, trachea midline, and thyroid not enlarged, symmetric, no tenderness/mass/nodules Lymph nodes: Cervical, supraclavicular, and axillary nodes normal. Resp: clear to auscultation bilaterally Back: symmetric, no curvature. ROM normal. No CVA tenderness. Cardio: regular rate and rhythm, S1, S2 normal, no murmur, click, rub or gallop  GI: soft, non-tender; bowel sounds normal; no masses,  no organomegaly Extremities: extremities normal, atraumatic, no cyanosis or edema Neurologic: Alert and oriented X 3, normal strength and tone. Normal symmetric reflexes. Normal coordination and gait  ECOG PERFORMANCE STATUS: 1 - Symptomatic but completely ambulatory  Blood pressure 131/86, pulse (!) 107, temperature 98.6 F (37 C), temperature source Oral, resp. rate 17, weight 194 lb 3.2 oz (88.1 kg), SpO2 94 %.  LABORATORY DATA: Lab Results  Component Value Date   WBC 11.1 (H) 09/24/2022   HGB 13.5 09/24/2022   HCT 44.0 09/24/2022   MCV 90.2 09/24/2022    PLT 215 09/24/2022      Chemistry      Component Value Date/Time   NA 142 09/24/2022 1044   NA 144 01/10/2021 0849   K 3.7 09/24/2022 1044   CL 107 09/24/2022 1044   CO2 27 09/24/2022 1044   BUN 11 09/24/2022 1044   BUN 15 01/10/2021 0849   CREATININE 1.20 09/24/2022 1044      Component Value Date/Time   CALCIUM 9.6 09/24/2022 1044   ALKPHOS 73 09/24/2022 1044   AST 25 09/24/2022 1044   ALT 32 09/24/2022 1044   BILITOT 0.3 09/24/2022 1044       RADIOGRAPHIC STUDIES: CT Chest W Contrast  Result Date: 10/12/2022 CLINICAL DATA:  Non-small-cell lung cancer.  Restaging. 07/20/2022 EXAM: CT CHEST, ABDOMEN, AND PELVIS WITH CONTRAST TECHNIQUE: Multidetector CT imaging of the chest, abdomen and pelvis was performed following the standard protocol during bolus administration of intravenous contrast. RADIATION DOSE REDUCTION: This exam was performed according to the departmental dose-optimization program which includes automated exposure control, adjustment of the mA and/or kV according to patient size and/or use of iterative reconstruction technique. CONTRAST:  136mL OMNIPAQUE IOHEXOL 300 MG/ML  SOLN COMPARISON:  07/20/2022 FINDINGS: CT CHEST FINDINGS Cardiovascular: The heart size is normal. No substantial pericardial effusion. Coronary artery calcification is evident. Mild atherosclerotic calcification is noted in the wall of the thoracic aorta. Mediastinum/Nodes: No mediastinal lymphadenopathy. There is no hilar lymphadenopathy. The esophagus has normal imaging features. There is no axillary lymphadenopathy. Lungs/Pleura: Biapical pleuroparenchymal scarring again noted. 7 mm right upper lobe pulmonary nodule measured previously is 7 mm again today on 50/505 5 mm posterior right upper lobe nodule on the same image was 5 mm previously, remeasured. Several additional scattered tiny solid and ground-glass nodules are also unchanged. Including the dominant ground-glass opacity measuring 7 mm in the  peripheral left upper lobe (78/505). No focal airspace consolidation. There is no evidence of pleural effusion. Stable scarring medial right middle lobe. Subtle changes of centrilobular emphysema noted. Musculoskeletal: No worrisome lytic or sclerotic osseous abnormality. CT ABDOMEN PELVIS FINDINGS Hepatobiliary: No suspicious focal abnormality within the liver parenchyma. Tiny hypodensities at the gallbladder fossa and posterior right liver inferiorly are stable, likely benign. There is no evidence for gallstones, gallbladder wall thickening, or pericholecystic fluid. No intrahepatic or extrahepatic biliary dilation. Pancreas: No focal mass lesion. No dilatation of the main duct. No intraparenchymal cyst. No peripancreatic edema. Spleen: No splenomegaly. No focal mass lesion. Adrenals/Urinary Tract: No adrenal nodule or mass. Right kidney unremarkable. 11 mm nonobstructing stone again identified interpolar left kidney. No evidence for hydroureter. The urinary bladder appears normal for the degree of distention. Stomach/Bowel: Stomach is unremarkable. No gastric wall thickening. No evidence of outlet obstruction. Duodenum is normally positioned as is the ligament of Treitz. No small bowel wall thickening. No small bowel dilatation. The terminal ileum is normal. The appendix  is normal. No gross colonic mass. No colonic wall thickening. Vascular/Lymphatic: There is moderate atherosclerotic calcification of the abdominal aorta without aneurysm. There is no gastrohepatic or hepatoduodenal ligament lymphadenopathy. No retroperitoneal or mesenteric lymphadenopathy. No pelvic sidewall lymphadenopathy. Reproductive: Prostate gland is enlarged. Other: No intraperitoneal free fluid. Musculoskeletal: No worrisome lytic or sclerotic osseous abnormality. IMPRESSION: 1. Stable exam. No new or progressive findings in the chest, abdomen, or pelvis to suggest recurrent or metastatic disease. 2. Stable bilateral pulmonary nodules  measuring up to 7 mm. 3. 11 mm nonobstructing left renal stone. 4. Prostatomegaly. 5. Aortic Atherosclerosis (ICD10-I70.0) and Emphysema (ICD10-J43.9). Electronically Signed   By: Misty Stanley M.D.   On: 10/12/2022 09:44   CT Abdomen Pelvis W Contrast  Result Date: 10/12/2022 CLINICAL DATA:  Non-small-cell lung cancer.  Restaging. 07/20/2022 EXAM: CT CHEST, ABDOMEN, AND PELVIS WITH CONTRAST TECHNIQUE: Multidetector CT imaging of the chest, abdomen and pelvis was performed following the standard protocol during bolus administration of intravenous contrast. RADIATION DOSE REDUCTION: This exam was performed according to the departmental dose-optimization program which includes automated exposure control, adjustment of the mA and/or kV according to patient size and/or use of iterative reconstruction technique. CONTRAST:  1102mL OMNIPAQUE IOHEXOL 300 MG/ML  SOLN COMPARISON:  07/20/2022 FINDINGS: CT CHEST FINDINGS Cardiovascular: The heart size is normal. No substantial pericardial effusion. Coronary artery calcification is evident. Mild atherosclerotic calcification is noted in the wall of the thoracic aorta. Mediastinum/Nodes: No mediastinal lymphadenopathy. There is no hilar lymphadenopathy. The esophagus has normal imaging features. There is no axillary lymphadenopathy. Lungs/Pleura: Biapical pleuroparenchymal scarring again noted. 7 mm right upper lobe pulmonary nodule measured previously is 7 mm again today on 50/505 5 mm posterior right upper lobe nodule on the same image was 5 mm previously, remeasured. Several additional scattered tiny solid and ground-glass nodules are also unchanged. Including the dominant ground-glass opacity measuring 7 mm in the peripheral left upper lobe (78/505). No focal airspace consolidation. There is no evidence of pleural effusion. Stable scarring medial right middle lobe. Subtle changes of centrilobular emphysema noted. Musculoskeletal: No worrisome lytic or sclerotic osseous  abnormality. CT ABDOMEN PELVIS FINDINGS Hepatobiliary: No suspicious focal abnormality within the liver parenchyma. Tiny hypodensities at the gallbladder fossa and posterior right liver inferiorly are stable, likely benign. There is no evidence for gallstones, gallbladder wall thickening, or pericholecystic fluid. No intrahepatic or extrahepatic biliary dilation. Pancreas: No focal mass lesion. No dilatation of the main duct. No intraparenchymal cyst. No peripancreatic edema. Spleen: No splenomegaly. No focal mass lesion. Adrenals/Urinary Tract: No adrenal nodule or mass. Right kidney unremarkable. 11 mm nonobstructing stone again identified interpolar left kidney. No evidence for hydroureter. The urinary bladder appears normal for the degree of distention. Stomach/Bowel: Stomach is unremarkable. No gastric wall thickening. No evidence of outlet obstruction. Duodenum is normally positioned as is the ligament of Treitz. No small bowel wall thickening. No small bowel dilatation. The terminal ileum is normal. The appendix is normal. No gross colonic mass. No colonic wall thickening. Vascular/Lymphatic: There is moderate atherosclerotic calcification of the abdominal aorta without aneurysm. There is no gastrohepatic or hepatoduodenal ligament lymphadenopathy. No retroperitoneal or mesenteric lymphadenopathy. No pelvic sidewall lymphadenopathy. Reproductive: Prostate gland is enlarged. Other: No intraperitoneal free fluid. Musculoskeletal: No worrisome lytic or sclerotic osseous abnormality. IMPRESSION: 1. Stable exam. No new or progressive findings in the chest, abdomen, or pelvis to suggest recurrent or metastatic disease. 2. Stable bilateral pulmonary nodules measuring up to 7 mm. 3. 11 mm nonobstructing left renal stone. 4.  Prostatomegaly. 5. Aortic Atherosclerosis (ICD10-I70.0) and Emphysema (ICD10-J43.9). Electronically Signed   By: Misty Stanley M.D.   On: 10/12/2022 09:44    ASSESSMENT AND PLAN: This is a very  pleasant 67 years old white male recently diagnosed with stage IV (T1b, N3, M1 C) non-small cell lung cancer favoring adenocarcinoma presented with right upper lobe lung nodule in addition to right hilar, subcarinal and bilateral mediastinal as well as supraclavicular lymphadenopathy.  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  His PD-L1 expression is 80% and his molecular studies showed KRAS G12C mutation. The patient underwent SRS to metastatic brain lesion under the care of Dr. Tammi Klippel and he is currently undergoing systemic chemotherapy with carboplatin for AUC of 5, Alimta 500 Mg/M2 and Keytruda 200 Mg IV every 3 weeks status post 25 cycles.  Starting from cycle #5 the patient will be treated with maintenance treatment with Alimta and Keytruda every 3 weeks. He has been tolerating this treatment well with no concerning adverse effects. He had repeat CT scan of the chest, abdomen and pelvis performed recently.  I personally and independently reviewed the scan and discussed the result with the patient and his wife. His scan showed no concerning findings for disease progression. I recommended for the patient to continue his treatment but I will delay the start of cycle number 26 x 1 week until improvement of his gastrointestinal symptoms which is likely to be viral gastroenteritis especially with his recent sickness during the holiday. He also has some erythema of the chin of the lower extremities, this is likely related to his history of diabetes mellitus and hyperglycemia.  He is seeing his primary care physician tomorrow and she may consider him for short course of treatment with antibiotics. I will see the patient back for follow-up visit next week for evaluation before resuming his treatment. The patient was advised to call immediately if he has any other concerning symptoms in the interval. The patient voices understanding of current disease status and treatment options  and is in agreement with the current care plan.  All questions were answered. The patient knows to call the clinic with any problems, questions or concerns. We can certainly see the patient much sooner if necessary. The total time spent in the appointment was 30 minutes.  Disclaimer: This note was dictated with voice recognition software. Similar sounding words can inadvertently be transcribed and may not be corrected upon review.

## 2022-10-16 ENCOUNTER — Ambulatory Visit: Payer: 59 | Admitting: Family Medicine

## 2022-10-16 ENCOUNTER — Ambulatory Visit (INDEPENDENT_AMBULATORY_CARE_PROVIDER_SITE_OTHER): Payer: Commercial Managed Care - PPO | Admitting: Family Medicine

## 2022-10-16 ENCOUNTER — Encounter: Payer: Self-pay | Admitting: Internal Medicine

## 2022-10-16 ENCOUNTER — Encounter: Payer: Self-pay | Admitting: Family Medicine

## 2022-10-16 ENCOUNTER — Other Ambulatory Visit (HOSPITAL_COMMUNITY): Payer: Self-pay

## 2022-10-16 VITALS — BP 138/87 | HR 91 | Temp 97.6°F | Ht 71.5 in | Wt 190.2 lb

## 2022-10-16 DIAGNOSIS — E1169 Type 2 diabetes mellitus with other specified complication: Secondary | ICD-10-CM

## 2022-10-16 DIAGNOSIS — E1165 Type 2 diabetes mellitus with hyperglycemia: Secondary | ICD-10-CM

## 2022-10-16 DIAGNOSIS — E039 Hypothyroidism, unspecified: Secondary | ICD-10-CM

## 2022-10-16 DIAGNOSIS — I152 Hypertension secondary to endocrine disorders: Secondary | ICD-10-CM

## 2022-10-16 DIAGNOSIS — R197 Diarrhea, unspecified: Secondary | ICD-10-CM | POA: Diagnosis not present

## 2022-10-16 DIAGNOSIS — E785 Hyperlipidemia, unspecified: Secondary | ICD-10-CM | POA: Diagnosis not present

## 2022-10-16 DIAGNOSIS — E1159 Type 2 diabetes mellitus with other circulatory complications: Secondary | ICD-10-CM

## 2022-10-16 DIAGNOSIS — C3491 Malignant neoplasm of unspecified part of right bronchus or lung: Secondary | ICD-10-CM

## 2022-10-16 DIAGNOSIS — L03115 Cellulitis of right lower limb: Secondary | ICD-10-CM | POA: Diagnosis not present

## 2022-10-16 LAB — BAYER DCA HB A1C WAIVED: HB A1C (BAYER DCA - WAIVED): 6.5 % — ABNORMAL HIGH (ref 4.8–5.6)

## 2022-10-16 MED ORDER — CEPHALEXIN 500 MG PO CAPS: 500.0000 mg | ORAL_CAPSULE | Freq: Four times a day (QID) | ORAL | 0 refills | Status: AC

## 2022-10-16 MED ORDER — OZEMPIC (0.25 OR 0.5 MG/DOSE) 2 MG/3ML ~~LOC~~ SOPN
0.5000 mg | PEN_INJECTOR | SUBCUTANEOUS | 1 refills | Status: DC
  Filled 2022-10-16: qty 9, 84d supply, fill #0
  Filled 2022-11-07: qty 3, 28d supply, fill #0
  Filled 2022-12-04: qty 3, 28d supply, fill #1
  Filled 2023-01-01: qty 3, 28d supply, fill #2
  Filled 2023-02-07: qty 3, 28d supply, fill #3
  Filled 2023-03-07 – 2023-03-18 (×2): qty 3, 28d supply, fill #4
  Filled 2023-04-18: qty 3, 28d supply, fill #5

## 2022-10-16 NOTE — Progress Notes (Signed)
Subjective:  Patient ID: Bradley Goodman, male    DOB: Apr 09, 1956, 67 y.o.   MRN: [id number]  Patient Care Team: [name], FNP as PCP - General (Family Medicine) Gala Romney Cristopher Estimable, MD as Consulting Physician (Gastroenterology) Celestia Khat, [facility] (Optometry)   Chief Complaint:  Diabetes (3 month follow up) and Diarrhea (X 2 day )   HPI: Bradley Goodman is a 67 y.o. male presenting on 10/16/2022 for Diabetes (3 month follow up) and Diarrhea (X 2 day )   1. Type 2 diabetes mellitus with hyperglycemia, without long-term current use of insulin (HCC) Pt has been doing well with Ozempic, does have diarrhea at present. States this started 2 days ago. Did have viral URI prior to diarrhea. Denies blood in stool, weakness, confusion, or decreased urine output. Has not tried anything for the symptoms. Seen by oncology yesterday and chemo treatment was delayed by one week due to illness. No polyuria, polyphagia, or polydipsia. He is followed by endocrinology and advised to lessen burden of appointments, will let endo manage diabetes and thyroid disease.   2. Acquired hypothyroidism Denies any hypo- or hyperthyroid symptoms. Compliant with medications.   3. Hyperlipidemia associated with type 2 diabetes mellitus ([place]) On statin and tolerating well. No reported myalgias. Does not follow a specific diet or exercise routine.   4. Hypertension associated with type 2 diabetes mellitus ([place]) Compliant with medications. No headaches, chest pain, leg swelling, weakness, or confusion.   5. Adenocarcinoma of right lung, stage 4 ([place]) Followed by oncology on a regular basis and is currently undergoing chemotherapy treatments.      Relevant past medical, surgical, family, and social history reviewed and updated as indicated.  Allergies and medications reviewed and updated. Data reviewed: Chart in Epic.   Past Medical History:  Diagnosis Date   Cervical spondylolysis    Essential hypertension     GERD (gastroesophageal reflux disease)    History of kidney stones    History of migraine    Hyperlipidemia    Hypertension    PONV (postoperative nausea and vomiting)    Type 2 diabetes mellitus ([place])     Past Surgical History:  Procedure Laterality Date   Bilateral inguinal hernia repair     BRONCHIAL NEEDLE ASPIRATION BIOPSY  04/05/2021   Procedure: BRONCHIAL NEEDLE ASPIRATION BIOPSIES;  Surgeon: Garner Nash, DO;  Location: [facility];  Service: Pulmonary;;   COLONOSCOPY  01/23/2012   Procedure: COLONOSCOPY;  Surgeon: Daneil Dolin, MD;  [facility];  Service: Endoscopy;  Laterality: N/A;  9:30 AM   COLONOSCOPY N/A 10/11/2015   Procedure: COLONOSCOPY;  Surgeon: Daneil Dolin, MD;  [facility];  Service: Endoscopy;  Laterality: N/A;  830   COLONOSCOPY N/A 10/14/2019   Procedure: COLONOSCOPY;  Surgeon: Daneil Dolin, MD;  [facility];  Service: Endoscopy;  Laterality: N/A;  1:45   POLYPECTOMY  10/14/2019   Procedure: POLYPECTOMY;  Surgeon: Daneil Dolin, MD;  [facility];  Service: Endoscopy;;  ascending colon, descending colon   VIDEO BRONCHOSCOPY WITH ENDOBRONCHIAL ULTRASOUND N/A 04/05/2021   Procedure: VIDEO BRONCHOSCOPY WITH ENDOBRONCHIAL ULTRASOUND;  Surgeon: Garner Nash, DO;  Location: [facility];  Service: Pulmonary;  Laterality: N/A;    Social History   Socioeconomic History   Marital status: Married    Spouse name: Not on file   Number of children: Not on file   Years of education: Not on file  Highest education level: Not on file  Occupational History   Not on file  Tobacco Use   Smoking status: Every Day    Packs/day: 0.50    Years: 30.00    Total pack years: 15.00    Types: Cigarettes    Start date: 10/30/1973   Smokeless tobacco: Never  Vaping Use   Vaping Use: Never used  Substance and Sexual Activity   Alcohol use: Yes    Alcohol/week: 0.0 standard drinks of alcohol    Comment: One drink every  6 months.   Drug use: No   Sexual activity: Yes  Other Topics Concern   Not on file  Social History Narrative   Not on file   Social Determinants of Health   Financial Resource Strain: Not on file  Food Insecurity: Not on file  Transportation Needs: Not on file  Physical Activity: Not on file  Stress: Not on file  Social Connections: Not on file  Intimate Partner Violence: Not on file    Outpatient Encounter Medications as of 10/16/2022  Medication Sig   cephALEXin (KEFLEX) 500 MG capsule Take 1 capsule (500 mg total) by mouth 4 (four) times daily for 7 days.   cholecalciferol (VITAMIN D) 1000 UNITS tablet Take 1,000 Units by mouth every evening.   Continuous Blood Gluc Sensor (FREESTYLE LIBRE 3 SENSOR) MISC Place 1 sensor on the skin every 14 days. Use to check glucose continuously   dexamethasone (DECADRON) 1 MG tablet Take 1 tablet (1 mg total) by mouth daily with breakfast.   docusate sodium (COLACE) 100 MG capsule Take 100 mg by mouth daily.   folic acid (FOLVITE) 1 MG tablet Take 1 tablet by mouth daily.   Krill Oil 300 MG CAPS Take 300 mg by mouth every evening.   levothyroxine (SYNTHROID) 75 MCG tablet Take 1 tablet (75 mcg total) by mouth daily before breakfast.   loratadine (CLARITIN) 10 MG tablet Take 10 mg by mouth every evening.    losartan (COZAAR) 50 MG tablet Take 1 and 1/2 tablets (75 mg total) by mouth daily.   mirtazapine (REMERON) 15 MG tablet Take 1 tablet by mouth at bedtime.   Multiple Vitamins-Minerals (MULTIVITAMIN GUMMIES ADULT PO) Take 2 each by mouth daily.   pantoprazole (PROTONIX) 40 MG tablet Take 1 tablet by mouth every evening.   PRESCRIPTION MEDICATION Testosterone injection   prochlorperazine (COMPAZINE) 10 MG tablet Take 1 tablet (10 mg total) by mouth every 6 (six) hours as needed for nausea or vomiting.   rosuvastatin (CRESTOR) 20 MG tablet Take 1 tablet (20 mg total) by mouth daily.   SYRINGE-NEEDLE, DISP, 3 ML 21G X 1-1/2" 3 ML MISC Use to  inject testosterone every week   testosterone cypionate (DEPOTESTOTERONE CYPIONATE) 100 MG/ML injection Take 50mg  ( 0.89ml) and 100mg  ( 30ml) into muscle alternatively weekly.   venlafaxine XR (EFFEXOR-XR) 75 MG 24 hr capsule Take 1 capsule (75 mg total) by mouth daily with breakfast.   [DISCONTINUED] Semaglutide,0.25 or 0.5MG /DOS, (OZEMPIC, 0.25 OR 0.5 MG/DOSE,) 2 MG/3ML SOPN Inject 0.5 mg into the skin once a week.   Semaglutide,0.25 or 0.5MG /DOS, (OZEMPIC, 0.25 OR 0.5 MG/DOSE,) 2 MG/3ML SOPN Inject 0.5 mg into the skin once a week.   [DISCONTINUED] benzonatate (TESSALON) 100 MG capsule Take 1 capsule (100 mg total) by mouth 3 (three) times daily as needed for cough.   [DISCONTINUED] ipratropium (ATROVENT) 0.03 % nasal spray Place 2 sprays into both nostrils every 12 (twelve) hours.   No facility-administered encounter  medications on file as of 10/16/2022.    No Known Allergies  Review of Systems  Constitutional:  Positive for fatigue. Negative for activity change, appetite change, chills, diaphoresis, fever and unexpected weight change.  HENT: Negative.    Eyes: Negative.   Respiratory:  Negative for cough, chest tightness and shortness of breath.   Cardiovascular:  Negative for chest pain, palpitations and leg swelling.  Gastrointestinal:  Positive for diarrhea. Negative for abdominal pain, blood in stool, constipation, nausea and vomiting.  Endocrine: Negative.  Negative for polydipsia, polyphagia and polyuria.  Genitourinary:  Negative for decreased urine volume, difficulty urinating, dysuria, frequency and urgency.  Musculoskeletal:  Negative for arthralgias and myalgias.  Skin:  Positive for color change and wound. Negative for pallor and rash.  Allergic/Immunologic: Negative.   Neurological:  Negative for dizziness, weakness and headaches.  Hematological: Negative.   Psychiatric/Behavioral:  Positive for sleep disturbance. Negative for agitation, behavioral problems, confusion,  decreased concentration, dysphoric mood, hallucinations, self-injury and suicidal ideas. The patient is not nervous/anxious and is not hyperactive.   All other systems reviewed and are negative.       Objective:  BP 138/87   Pulse 91   Temp 97.6 F (36.4 C) (Temporal)   Ht 5' 11.5" (1.816 m)   Wt 190 lb 3.2 oz (86.3 kg)   SpO2 97%   BMI 26.16 kg/m    Wt Readings from Last 3 Encounters:  10/16/22 190 lb 3.2 oz (86.3 kg)  10/15/22 194 lb 3.2 oz (88.1 kg)  09/24/22 194 lb 4 oz (88.1 kg)    Physical Exam Vitals and nursing note reviewed.  Constitutional:      General: He is not in acute distress.    Appearance: Normal appearance. He is well-developed and well-groomed. He is not ill-appearing, toxic-appearing or diaphoretic.  HENT:     Head: Normocephalic and atraumatic.     Jaw: There is normal jaw occlusion.     Right Ear: Hearing normal.     Left Ear: Hearing normal.     Nose: Nose normal.     Mouth/Throat:     Lips: Pink.     Mouth: Mucous membranes are moist.     Pharynx: Oropharynx is clear. Uvula midline.  Eyes:     General: Lids are normal.     Extraocular Movements: Extraocular movements intact.     Conjunctiva/sclera: Conjunctivae normal.     Pupils: Pupils are equal, round, and reactive to light.  Neck:     Thyroid: No thyroid mass, thyromegaly or thyroid tenderness.     Vascular: No carotid bruit or JVD.     Trachea: Trachea and phonation normal.  Cardiovascular:     Rate and Rhythm: Normal rate and regular rhythm.     Chest Wall: PMI is not displaced.     Pulses: Normal pulses.     Heart sounds: Normal heart sounds. No murmur heard.    No friction rub. No gallop.  Pulmonary:     Effort: Pulmonary effort is normal. No respiratory distress.     Breath sounds: Normal breath sounds. No wheezing or rhonchi.  Abdominal:     General: Bowel sounds are normal. There is no distension or abdominal bruit.     Palpations: Abdomen is soft. There is no hepatomegaly  or splenomegaly.     Tenderness: There is no abdominal tenderness. There is no right CVA tenderness or left CVA tenderness.     Hernia: No hernia is present.  Musculoskeletal:  General: Normal range of motion.     Cervical back: Normal range of motion and neck supple.     Right lower leg: No edema.     Left lower leg: No edema.  Lymphadenopathy:     Cervical: No cervical adenopathy.  Skin:    General: Skin is warm and dry.     Capillary Refill: Capillary refill takes less than 2 seconds.     Coloration: Skin is not cyanotic, jaundiced or pale.     Findings: Erythema and wound present. No rash.       Neurological:     General: No focal deficit present.     Mental Status: He is alert and oriented to person, place, and time.     Sensory: Sensation is intact.     Motor: Motor function is intact.     Coordination: Coordination is intact.     Gait: Gait is intact.     Deep Tendon Reflexes: Reflexes are normal and symmetric.  Psychiatric:        Attention and Perception: Attention and perception normal.        Mood and Affect: Mood and affect normal.        Speech: Speech normal.        Behavior: Behavior normal. Behavior is cooperative.        Thought Content: Thought content normal.        Cognition and Memory: Cognition and memory normal.        Judgment: Judgment normal.     Results for orders placed or performed in visit on 10/16/22  Bayer DCA Hb A1c Waived  Result Value Ref Range   HB A1C (BAYER DCA - WAIVED) 6.5 (H) 4.8 - 5.6 %       Pertinent labs & imaging results that were available during my care of the patient were reviewed by me and considered in my medical decision making.  Assessment & Plan:  Jaymz was seen today for diabetes and diarrhea.  Diagnoses and all orders for this visit:  Type 2 diabetes mellitus with hyperglycemia, without long-term current use of insulin (HCC) Pt is followed by endocrinology on a regular basis, will have pt to see  endocrinology for DM management to lessen the number of appointments with multiple providers. Great control as A1C is 6.5 today. Continue current regimen.  -     Bayer DCA Hb A1c Waived -     Semaglutide,0.25 or 0.5MG /DOS, (OZEMPIC, 0.25 OR 0.5 MG/DOSE,) 2 MG/3ML SOPN; Inject 0.5 mg into the skin once a week.  Acquired hypothyroidism Will refer back to endocrinology for management to help lessen the number of appointments with multiple providers. Labs pending.  -     Thyroid Panel With TSH  Hyperlipidemia associated with type 2 diabetes mellitus ([place]) Diet encouraged - increase intake of fresh fruits and vegetables, increase intake of lean proteins. Bake, broil, or grill foods. Avoid fried, greasy, and fatty foods. Avoid fast foods. Increase intake of fiber-rich whole grains. Exercise encouraged - at least 150 minutes per week and advance as tolerated. Goal BMI < 25. Continue medications as prescribed. Follow up in 3-6 months as discussed.   Hypertension associated with type 2 diabetes mellitus (HCC) BP well controlled. Changes were not made in regimen today. Goal BP is 130/80. Pt aware to report any persistent high or low readings. DASH diet and exercise encouraged. Exercise at least 150 minutes per week and increase as tolerated. Goal BMI > 25. Stress management encouraged. Avoid  nicotine and tobacco product use. Avoid excessive alcohol and NSAID's. Avoid more than 2000 mg of sodium daily. Medications as prescribed. Follow up as scheduled.   Adenocarcinoma of right lung, stage 4 ([place]) Followed by oncology on a regular basis and is currently undergoing chemotherapy.   Diarrhea in adult patient 2 days of watery diarrhea. Will obtain stool sample for testing. BRAT diet encouraged. Further testing pending results.  -     Cdiff NAA+O+P+Stool Culture  Cellulitis of right leg Will treat with below. Pt aware to report new, worsening, or persistent symptoms.  -     cephALEXin (KEFLEX) 500 MG capsule;  Take 1 capsule (500 mg total) by mouth 4 (four) times daily for 7 days.     Continue all other maintenance medications.  Follow up plan: Return in about 6 months (around 04/16/2023), or if symptoms worsen or fail to improve, for chronic follow.   Continue healthy lifestyle choices, including diet (rich in fruits, vegetables, and lean proteins, and low in salt and simple carbohydrates) and exercise (at least 30 minutes of moderate physical activity daily).  Educational handout given for DM  The above assessment and management plan was discussed with the patient. The patient verbalized understanding of and has agreed to the management plan. Patient is aware to call the clinic if they develop any new symptoms or if symptoms persist or worsen. Patient is aware when to return to the clinic for a follow-up visit. Patient educated on when it is appropriate to go to the emergency department.   Monia Pouch, FNP-C [facility] [phone]

## 2022-10-16 NOTE — Patient Instructions (Addendum)

## 2022-10-17 ENCOUNTER — Other Ambulatory Visit: Payer: Self-pay

## 2022-10-17 ENCOUNTER — Other Ambulatory Visit (HOSPITAL_COMMUNITY): Payer: Self-pay

## 2022-10-17 LAB — THYROID PANEL WITH TSH
Free Thyroxine Index: 2 (ref 1.2–4.9)
T3 Uptake Ratio: 26 % (ref 24–39)
T4, Total: 7.8 ug/dL (ref 4.5–12.0)
TSH: 0.78 u[IU]/mL (ref 0.450–4.500)

## 2022-10-18 NOTE — Progress Notes (Unsigned)
[facility] OFFICE PROGRESS NOTE  Bradley Masters, FNP [address] [place] [address]  DIAGNOSIS: Stage IV (T1b, N3, M1C) non-small cell lung cancer, favoring adenocarcinoma presented with right upper lobe lung nodule in addition to right hilar, subcarinal and bilateral mediastinal as well as supraclavicular lymphadenopathy in addition to bone and brain metastasis diagnosed in June 2022.   PD-L1 expression 80%.     Molecular Studies:  Biomarker Findings Microsatellite status - MS-Stable Tumor Mutational Burden - 6 Muts/Mb Genomic Findings For a complete list of the genes assayed, please refer to the Appendix. KRAS G12C, amplification ATM S470* CCND1 amplification - equivocal? HGF amplification - equivocal? MYC amplification - equivocal? FGF19 amplification - equivocal? FGF3 amplification - equivocal? FGF4 amplification - equivocal? NFKBIA amplification NKX2-1 amplification RAD21 amplification - equivocal? [id number]*26 TERT promoter -124C>T TP53 rearrangement exon 9 7 Disease relevant genes with no reportable alterations: ALK, BRAF, EGFR, ERBB2, MET, RET, ROS1  PRIOR THERAPY: SRS to the metastatic brain lesions under the care of Dr. Kathrynn Running.  Last treatment on 05/04/2021.    CURRENT THERAPY: Palliative systemic chemotherapy with carboplatin for an AUC 5, Alimta 500 mg/m2 and, Keytruda 200 mg IV every 3 weeks.  First dose expected on 05/08/2021.  Status post 25 cycles.  Starting from cycle #5 he is on maintenance treatment with Alimta and Keytruda every 3 weeks.   INTERVAL HISTORY: Bradley Goodman 67 y.o. male returns to the clinic today for a follow-up visit accompanied by his wife.  The patient was last seen last week by Dr. Arbutus Ped.  The patient was recovering from a suspected viral gastroenteritis.  Therefore Dr. Arbutus Ped gave him the next week to recover.  Per chart review, the patient submitted a stool sample with his PCP on 10/19/22. It appears this  is positive for C. Diff. Vancomycin was sent to the pharmacy yesterday. The patient was not aware. He will pick this up today.   At this time the patient is feeling better.  Diarrhea nausea and vomiting has resolved. Today, he denies any fever, chills, night sweats, or unexplained weight loss.  Denies any dyspnea, cough, chest pain, or hemoptysis.  Denies any nausea, vomiting, or constipation. The patient reports a long history of migraines. He denies unusual headaches. He denies any vision changes. He is scheduled to review his brain MRI with Dr. Barbaraann Cao tomorrow.  He is here today for evaluation and repeat blood work before undergoing cycle #6    MEDICAL HISTORY: Past Medical History:  Diagnosis Date   Cervical spondylolysis    Essential hypertension    GERD (gastroesophageal reflux disease)    History of kidney stones    History of migraine    Hyperlipidemia    Hypertension    PONV (postoperative nausea and vomiting)    Type 2 diabetes mellitus (HCC)     ALLERGIES:  has No Known Allergies.  MEDICATIONS:  Current Outpatient Medications  Medication Sig Dispense Refill   cephALEXin (KEFLEX) 500 MG capsule Take 1 capsule (500 mg total) by mouth 4 (four) times daily for 7 days. 28 capsule 0   cholecalciferol (VITAMIN D) 1000 UNITS tablet Take 1,000 Units by mouth every evening.     Continuous Blood Gluc Sensor (FREESTYLE LIBRE 3 SENSOR) MISC Place 1 sensor on the skin every 14 days. Use to check glucose continuously 2 each 2   dexamethasone (DECADRON) 1 MG tablet Take 1 tablet (1 mg total) by mouth daily with breakfast. 90 tablet 1  docusate sodium (COLACE) 100 MG capsule Take 100 mg by mouth daily.     folic acid (FOLVITE) 1 MG tablet Take 1 tablet by mouth daily. 90 tablet 4   Krill Oil 300 MG CAPS Take 300 mg by mouth every evening.     levothyroxine (SYNTHROID) 75 MCG tablet Take 1 tablet (75 mcg total) by mouth daily before breakfast. 90 tablet 1   loratadine (CLARITIN) 10 MG  tablet Take 10 mg by mouth every evening.      losartan (COZAAR) 50 MG tablet Take 1 and 1/2 tablets (75 mg total) by mouth daily. 135 tablet 1   mirtazapine (REMERON) 15 MG tablet Take 1 tablet by mouth at bedtime. 90 tablet 1   Multiple Vitamins-Minerals (MULTIVITAMIN GUMMIES ADULT PO) Take 2 each by mouth daily.     pantoprazole (PROTONIX) 40 MG tablet Take 1 tablet by mouth every evening. 90 tablet 1   PRESCRIPTION MEDICATION Testosterone injection     prochlorperazine (COMPAZINE) 10 MG tablet Take 1 tablet (10 mg total) by mouth every 6 (six) hours as needed for nausea or vomiting. 30 tablet 0   rosuvastatin (CRESTOR) 20 MG tablet Take 1 tablet (20 mg total) by mouth daily. 90 tablet 1   Semaglutide,0.25 or 0.5MG /DOS, (OZEMPIC, 0.25 OR 0.5 MG/DOSE,) 2 MG/3ML SOPN Inject 0.5 mg into the skin once a week. 9 mL 1   SYRINGE-NEEDLE, DISP, 3 ML 21G X 1-1/2" 3 ML MISC Use to inject testosterone every week 100 each 2   testosterone cypionate (DEPOTESTOTERONE CYPIONATE) 100 MG/ML injection Take 50mg  ( 0.20ml) and 100mg  ( 67ml) into muscle alternatively weekly. 10 mL 0   vancomycin (VANCOCIN) 125 MG capsule Take 1 capsule (125 mg total) by mouth 4 (four) times daily for 10 days. 40 capsule 0   venlafaxine XR (EFFEXOR-XR) 75 MG 24 hr capsule Take 1 capsule (75 mg total) by mouth daily with breakfast. 90 capsule 1   No current facility-administered medications for this visit.    SURGICAL HISTORY:  Past Surgical History:  Procedure Laterality Date   Bilateral inguinal hernia repair     BRONCHIAL NEEDLE ASPIRATION BIOPSY  04/05/2021   Procedure: BRONCHIAL NEEDLE ASPIRATION BIOPSIES;  Surgeon: Garner Nash, DO;  Location: [facility];  Service: Pulmonary;;   COLONOSCOPY  01/23/2012   Procedure: COLONOSCOPY;  Surgeon: Daneil Dolin, MD;  [facility];  Service: Endoscopy;  Laterality: N/A;  9:30 AM   COLONOSCOPY N/A 10/11/2015   Procedure: COLONOSCOPY;  Surgeon: Daneil Dolin, MD;   [facility];  Service: Endoscopy;  Laterality: N/A;  830   COLONOSCOPY N/A 10/14/2019   Procedure: COLONOSCOPY;  Surgeon: Daneil Dolin, MD;  [facility];  Service: Endoscopy;  Laterality: N/A;  1:45   POLYPECTOMY  10/14/2019   Procedure: POLYPECTOMY;  Surgeon: Daneil Dolin, MD;  [facility];  Service: Endoscopy;;  ascending colon, descending colon   VIDEO BRONCHOSCOPY WITH ENDOBRONCHIAL ULTRASOUND N/A 04/05/2021   Procedure: VIDEO BRONCHOSCOPY WITH ENDOBRONCHIAL ULTRASOUND;  Surgeon: Garner Nash, DO;  Location: [facility];  Service: Pulmonary;  Laterality: N/A;    REVIEW OF SYSTEMS:   Review of Systems  Constitutional: Negative for appetite change, chills, fatigue, fever and unexpected weight change.  HENT:   Negative for mouth sores, nosebleeds, sore throat and trouble swallowing.   Eyes: Negative for eye problems and icterus.  Respiratory: Negative for cough, hemoptysis, shortness of breath and wheezing.   Cardiovascular: Negative for chest  pain and leg swelling.  Gastrointestinal: Negative for abdominal pain, constipation, diarrhea (resolved), nausea and vomiting.  Genitourinary: Negative for bladder incontinence, difficulty urinating, dysuria, frequency and hematuria.   Musculoskeletal: Negative for back pain, gait problem, neck pain and neck stiffness.  Skin: Negative for itching and rash.  Neurological: Negative for dizziness, extremity weakness, gait problem, headaches, light-headedness and seizures.  Hematological: Negative for adenopathy. Does not bruise/bleed easily.  Psychiatric/Behavioral: Negative for confusion, depression and sleep disturbance. The patient is not nervous/anxious.     PHYSICAL EXAMINATION:  Blood pressure (!) 118/90, pulse (!) 102, temperature 98.2 F (36.8 C), temperature source Oral, resp. rate 17, weight 192 lb 12.8 oz (87.5 kg), SpO2 96 %.  ECOG PERFORMANCE STATUS: 1  Physical Exam  Constitutional: Oriented  to person, place, and time and well-developed, well-nourished, and in no distress. No distress.  HENT:  Head: Normocephalic and atraumatic.  Mouth/Throat: Oropharynx is clear and moist. No oropharyngeal exudate.  Eyes: Conjunctivae are normal. Right eye exhibits no discharge. Left eye exhibits no discharge. No scleral icterus.  Neck: Normal range of motion. Neck supple.  Cardiovascular: Normal rate, regular rhythm, normal heart sounds and intact distal pulses.   Pulmonary/Chest: Effort normal and breath sounds normal. No respiratory distress. No wheezes. No rales.  Abdominal: Soft. Bowel sounds are normal. Exhibits no distension and no mass. There is no tenderness.  Musculoskeletal: Normal range of motion. Exhibits no edema.  Lymphadenopathy:    No cervical adenopathy.  Neurological: Alert and oriented to person, place, and time. Exhibits normal muscle tone. Gait normal. Coordination normal.  Skin: Skin is warm and dry. No rash noted. Not diaphoretic. No erythema. No pallor.  Psychiatric: Mood, memory and judgment normal.  Vitals reviewed.  LABORATORY DATA: Lab Results  Component Value Date   WBC 9.4 10/22/2022   HGB 12.3 (L) 10/22/2022   HCT 39.4 10/22/2022   MCV 88.3 10/22/2022   PLT 248 10/22/2022      Chemistry      Component Value Date/Time   NA 140 10/22/2022 0831   NA 144 01/10/2021 0849   K 3.8 10/22/2022 0831   CL 107 10/22/2022 0831   CO2 27 10/22/2022 0831   BUN 10 10/22/2022 0831   BUN 15 01/10/2021 0849   CREATININE 1.03 10/22/2022 0831      Component Value Date/Time   CALCIUM 8.9 10/22/2022 0831   ALKPHOS 63 10/22/2022 0831   AST 19 10/22/2022 0831   ALT 20 10/22/2022 0831   BILITOT 0.2 (L) 10/22/2022 0831       RADIOGRAPHIC STUDIES:  MR BRAIN W WO CONTRAST  Result Date: 10/21/2022 CLINICAL DATA:  Provided history: Malignant neoplasm metastatic to brain. Brain/CNS neoplasm, assess treatment response. EXAM: MRI HEAD WITHOUT AND WITH CONTRAST  TECHNIQUE: Multiplanar, multiecho pulse sequences of the brain and surrounding structures were obtained without and with intravenous contrast. CONTRAST:  9 mL Vueway intravenous contrast. COMPARISON:  Prior brain MRI examinations 06/15/22 and earlier FINDINGS: Brain: No age advanced or lobar predominant parenchymal atrophy. A 3 mm enhancing lesion in the left cerebellar hemisphere is unchanged (SE 13, IM 51). T2 FLAIR hyperintense signal abnormality at this site has also remained stable. The previously demonstrated punctate enhancing parenchymal lesion at the right parieto-occipital junction is not appreciated on today's exam. No new intracranial metastasis is identified. Moderate for age multifocal T2 FLAIR hyperintense signal abnormality within the bilateral cerebral white matter, non-specific but compatible with chronic small vessel ischemic disease. Minimal chronic small vessel ischemic changes are  also again demonstrated within the pons. There is no acute infarct. No chronic intracranial blood products. No extra-axial fluid collection. No midline shift. Vascular: Maintained flow voids within the proximal large arterial vessels. Skull and upper cervical spine: No focal suspicious marrow lesion. Sinuses/Orbits: No mass or acute finding within the imaged orbits. Minimal mucosal thickening within the bilateral ethmoid and right maxillary sinuses. Other: Small-volume fluid within the bilateral mastoid air cells. Unchanged chronic round-to-ovoid T1 hyperintense lesion within the anterior maxilla to the left, non-specific but possibly reflecting a residual cyst (SE 11, IM 10). IMPRESSION: 1. Unchanged 3 mm enhancing lesion in the left cerebellar hemisphere. 2. The previously demonstrated punctate enhancing parenchymal lesion at the right parieto-occipital junction is not appreciated on today's exam. 3. No new intracranial metastasis is identified. Electronically Signed   By: Jackey Loge D.O.   On: 10/21/2022 14:57    CT Chest W Contrast  Result Date: 10/12/2022 CLINICAL DATA:  Non-small-cell lung cancer.  Restaging. 07/20/2022 EXAM: CT CHEST, ABDOMEN, AND PELVIS WITH CONTRAST TECHNIQUE: Multidetector CT imaging of the chest, abdomen and pelvis was performed following the standard protocol during bolus administration of intravenous contrast. RADIATION DOSE REDUCTION: This exam was performed according to the departmental dose-optimization program which includes automated exposure control, adjustment of the mA and/or kV according to patient size and/or use of iterative reconstruction technique. CONTRAST:  OMNIPAQUE IOHEXOL 300 MG/ML  SOLN COMPARISON:  07/20/2022 FINDINGS: CT CHEST FINDINGS Cardiovascular: The heart size is normal. No substantial pericardial effusion. Coronary artery calcification is evident. Mild atherosclerotic calcification is noted in the wall of the thoracic aorta. Mediastinum/Nodes: No mediastinal lymphadenopathy. There is no hilar lymphadenopathy. The esophagus has normal imaging features. There is no axillary lymphadenopathy. Lungs/Pleura: Biapical pleuroparenchymal scarring again noted. 7 mm right upper lobe pulmonary nodule measured previously is 7 mm again today on 50/505 5 mm posterior right upper lobe nodule on the same image was 5 mm previously, remeasured. Several additional scattered tiny solid and ground-glass nodules are also unchanged. Including the dominant ground-glass opacity measuring 7 mm in the peripheral left upper lobe (78/505). No focal airspace consolidation. There is no evidence of pleural effusion. Stable scarring medial right middle lobe. Subtle changes of centrilobular emphysema noted. Musculoskeletal: No worrisome lytic or sclerotic osseous abnormality. CT ABDOMEN PELVIS FINDINGS Hepatobiliary: No suspicious focal abnormality within the liver parenchyma. Tiny hypodensities at the gallbladder fossa and posterior right liver inferiorly are stable, likely benign. There is no  evidence for gallstones, gallbladder wall thickening, or pericholecystic fluid. No intrahepatic or extrahepatic biliary dilation. Pancreas: No focal mass lesion. No dilatation of the main duct. No intraparenchymal cyst. No peripancreatic edema. Spleen: No splenomegaly. No focal mass lesion. Adrenals/Urinary Tract: No adrenal nodule or mass. Right kidney unremarkable. 11 mm nonobstructing stone again identified interpolar left kidney. No evidence for hydroureter. The urinary bladder appears normal for the degree of distention. Stomach/Bowel: Stomach is unremarkable. No gastric wall thickening. No evidence of outlet obstruction. Duodenum is normally positioned as is the ligament of Treitz. No small bowel wall thickening. No small bowel dilatation. The terminal ileum is normal. The appendix is normal. No gross colonic mass. No colonic wall thickening. Vascular/Lymphatic: There is moderate atherosclerotic calcification of the abdominal aorta without aneurysm. There is no gastrohepatic or hepatoduodenal ligament lymphadenopathy. No retroperitoneal or mesenteric lymphadenopathy. No pelvic sidewall lymphadenopathy. Reproductive: Prostate gland is enlarged. Other: No intraperitoneal free fluid. Musculoskeletal: No worrisome lytic or sclerotic osseous abnormality. IMPRESSION: 1. Stable exam. No new or progressive findings  in the chest, abdomen, or pelvis to suggest recurrent or metastatic disease. 2. Stable bilateral pulmonary nodules measuring up to 7 mm. 3. 11 mm nonobstructing left renal stone. 4. Prostatomegaly. 5. Aortic Atherosclerosis (ICD10-I70.0) and Emphysema (ICD10-J43.9). Electronically Signed   By: Misty Stanley M.D.   On: 10/12/2022 09:44   CT Abdomen Pelvis W Contrast  Result Date: 10/12/2022 CLINICAL DATA:  Non-small-cell lung cancer.  Restaging. 07/20/2022 EXAM: CT CHEST, ABDOMEN, AND PELVIS WITH CONTRAST TECHNIQUE: Multidetector CT imaging of the chest, abdomen and pelvis was performed following the  standard protocol during bolus administration of intravenous contrast. RADIATION DOSE REDUCTION: This exam was performed according to the departmental dose-optimization program which includes automated exposure control, adjustment of the mA and/or kV according to patient size and/or use of iterative reconstruction technique. CONTRAST:  163mL OMNIPAQUE IOHEXOL 300 MG/ML  SOLN COMPARISON:  07/20/2022 FINDINGS: CT CHEST FINDINGS Cardiovascular: The heart size is normal. No substantial pericardial effusion. Coronary artery calcification is evident. Mild atherosclerotic calcification is noted in the wall of the thoracic aorta. Mediastinum/Nodes: No mediastinal lymphadenopathy. There is no hilar lymphadenopathy. The esophagus has normal imaging features. There is no axillary lymphadenopathy. Lungs/Pleura: Biapical pleuroparenchymal scarring again noted. 7 mm right upper lobe pulmonary nodule measured previously is 7 mm again today on 50/505 5 mm posterior right upper lobe nodule on the same image was 5 mm previously, remeasured. Several additional scattered tiny solid and ground-glass nodules are also unchanged. Including the dominant ground-glass opacity measuring 7 mm in the peripheral left upper lobe (78/505). No focal airspace consolidation. There is no evidence of pleural effusion. Stable scarring medial right middle lobe. Subtle changes of centrilobular emphysema noted. Musculoskeletal: No worrisome lytic or sclerotic osseous abnormality. CT ABDOMEN PELVIS FINDINGS Hepatobiliary: No suspicious focal abnormality within the liver parenchyma. Tiny hypodensities at the gallbladder fossa and posterior right liver inferiorly are stable, likely benign. There is no evidence for gallstones, gallbladder wall thickening, or pericholecystic fluid. No intrahepatic or extrahepatic biliary dilation. Pancreas: No focal mass lesion. No dilatation of the main duct. No intraparenchymal cyst. No peripancreatic edema. Spleen: No  splenomegaly. No focal mass lesion. Adrenals/Urinary Tract: No adrenal nodule or mass. Right kidney unremarkable. 11 mm nonobstructing stone again identified interpolar left kidney. No evidence for hydroureter. The urinary bladder appears normal for the degree of distention. Stomach/Bowel: Stomach is unremarkable. No gastric wall thickening. No evidence of outlet obstruction. Duodenum is normally positioned as is the ligament of Treitz. No small bowel wall thickening. No small bowel dilatation. The terminal ileum is normal. The appendix is normal. No gross colonic mass. No colonic wall thickening. Vascular/Lymphatic: There is moderate atherosclerotic calcification of the abdominal aorta without aneurysm. There is no gastrohepatic or hepatoduodenal ligament lymphadenopathy. No retroperitoneal or mesenteric lymphadenopathy. No pelvic sidewall lymphadenopathy. Reproductive: Prostate gland is enlarged. Other: No intraperitoneal free fluid. Musculoskeletal: No worrisome lytic or sclerotic osseous abnormality. IMPRESSION: 1. Stable exam. No new or progressive findings in the chest, abdomen, or pelvis to suggest recurrent or metastatic disease. 2. Stable bilateral pulmonary nodules measuring up to 7 mm. 3. 11 mm nonobstructing left renal stone. 4. Prostatomegaly. 5. Aortic Atherosclerosis (ICD10-I70.0) and Emphysema (ICD10-J43.9). Electronically Signed   By: Misty Stanley M.D.   On: 10/12/2022 09:44     ASSESSMENT/PLAN:  This is a very pleasant 67 year old Caucasian male diagnosed with stage IV (T1b, N3, M1 C) non-small cell lung cancer, favoring adenocarcinoma.  The patient presented with a right upper lobe lung nodule in addition to right hilar,  subcarinal, and bilateral mediastinal lymphadenopathy as well as supraclavicular lymphadenopathy.  The patient also has metastatic disease to the brain.  He was diagnosed in June 2022.  His PD-L1 expression is 80%.  The patient's molecular studies show that he has a K-ras  G12C mutation which can be used for targeted treatment in the second line setting.   The patient completed SRS to the brain lesions under the care of Dr. Kathrynn Running.  His last treatment was on 05/04/2021. 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   The patient is currently undergoing systemic chemotherapy with carboplatin for an AUC of 5, Alimta 500 mg per metered squared, Keytruda 200 mg IV every 3 weeks.  Status post 25 cycles. Starting from cycle #5, the patient started maintenance Alimta and Keytruda IV every 3 weeks.   He is going to pick up his vancomycin for his C.diff today and will start taking this. We will delay his treatment by 1 week until he can be treated.    Labs were reviewed. Next week, he is wondering if he needs weekly labs since he has had labs performed the last two weeks. I discussed we can use his labs from this week, unless he gets new symptoms in the interval, such as worsening diarrhea, in which case I would like labs to ensure no dehydration or electrolyte derangement. He does not need to see a provider next week.    We will see him back for follow-up visit in 4 weeks for evaluation and repeat blood work before starting cycle #27  The patient was advised to call immediately if he has any concerning symptoms in the interval. The patient voices understanding of current disease status and treatment options and is in agreement with the current care plan. All questions were answered. The patient knows to call the clinic with any problems, questions or concerns. We can certainly see the patient much sooner if necessary      Orders Placed This Encounter  Procedures   CBC with Differential (Cancer Center Only)    Standing Status:   Future    Standing Expiration Date:   11/02/2023   CMP (Cancer Center only)    Standing Status:   Future    Standing Expiration Date:   11/02/2023     . The total time spent in the appointment was  20-29 minutes.   Ahmiyah Coil L Daijah Scrivens, PA-C 10/22/22

## 2022-10-19 ENCOUNTER — Telehealth: Payer: Self-pay | Admitting: Internal Medicine

## 2022-10-19 ENCOUNTER — Ambulatory Visit
Admission: RE | Admit: 2022-10-19 | Discharge: 2022-10-19 | Disposition: A | Discharge status: Home / Self Care | Payer: Commercial Managed Care - PPO | Source: Ambulatory Visit | Attending: Internal Medicine

## 2022-10-19 ENCOUNTER — Other Ambulatory Visit: Payer: Self-pay | Admitting: Family Medicine

## 2022-10-19 ENCOUNTER — Other Ambulatory Visit: Payer: Commercial Managed Care - PPO

## 2022-10-19 DIAGNOSIS — C7931 Secondary malignant neoplasm of brain: Secondary | ICD-10-CM

## 2022-10-19 DIAGNOSIS — A0472 Enterocolitis due to Clostridium difficile, not specified as recurrent: Secondary | ICD-10-CM

## 2022-10-19 DIAGNOSIS — R197 Diarrhea, unspecified: Secondary | ICD-10-CM | POA: Diagnosis not present

## 2022-10-19 DIAGNOSIS — C729 Malignant neoplasm of central nervous system, unspecified: Secondary | ICD-10-CM | POA: Diagnosis not present

## 2022-10-19 MED ORDER — GADOPICLENOL 0.5 MMOL/ML IV SOLN
9.0000 mL | Freq: Once | INTRAVENOUS | Status: AC | PRN
  Administered 2022-10-19: 9 mL via INTRAVENOUS

## 2022-10-19 NOTE — Telephone Encounter (Signed)
Scheduled per 01/08 work-queue and los, patient has been called and voicemail was left regarding January - April appointments.

## 2022-10-21 MED ORDER — VANCOMYCIN HCL 125 MG PO CAPS
125.0000 mg | ORAL_CAPSULE | Freq: Four times a day (QID) | ORAL | 0 refills | Status: AC
  Filled 2022-10-21 – 2022-10-22 (×2): qty 40, 10d supply, fill #0

## 2022-10-21 NOTE — Addendum Note (Signed)
Addended by: [name] on: 10/21/2022 07:53 PM   Modules accepted: Orders

## 2022-10-22 ENCOUNTER — Inpatient Hospital Stay: Payer: Commercial Managed Care - PPO

## 2022-10-22 ENCOUNTER — Encounter: Payer: Self-pay | Admitting: Internal Medicine

## 2022-10-22 ENCOUNTER — Inpatient Hospital Stay (HOSPITAL_BASED_OUTPATIENT_CLINIC_OR_DEPARTMENT_OTHER): Payer: Commercial Managed Care - PPO | Admitting: Physician Assistant

## 2022-10-22 ENCOUNTER — Other Ambulatory Visit (HOSPITAL_COMMUNITY): Payer: Self-pay

## 2022-10-22 VITALS — BP 118/90 | HR 102 | Temp 98.2°F | Resp 17 | Wt 192.8 lb

## 2022-10-22 DIAGNOSIS — Z5112 Encounter for antineoplastic immunotherapy: Secondary | ICD-10-CM | POA: Diagnosis not present

## 2022-10-22 DIAGNOSIS — C3491 Malignant neoplasm of unspecified part of right bronchus or lung: Secondary | ICD-10-CM

## 2022-10-22 LAB — CMP (CANCER CENTER ONLY)
ALT: 20 U/L (ref 0–44)
AST: 19 U/L (ref 15–41)
Albumin: 3.6 g/dL (ref 3.5–5.0)
Alkaline Phosphatase: 63 U/L (ref 38–126)
Anion gap: 6 (ref 5–15)
BUN: 10 mg/dL (ref 8–23)
CO2: 27 mmol/L (ref 22–32)
Calcium: 8.9 mg/dL (ref 8.9–10.3)
Chloride: 107 mmol/L (ref 98–111)
Creatinine: 1.03 mg/dL (ref 0.61–1.24)
GFR, Estimated: >60 mL/min (ref >60)
Glucose, Bld: 89 mg/dL (ref 70–99)
Potassium: 3.8 mmol/L (ref 3.5–5.1)
Sodium: 140 mmol/L (ref 135–145)
Total Bilirubin: 0.2 mg/dL — ABNORMAL LOW (ref 0.3–1.2)
Total Protein: 6.3 g/dL — ABNORMAL LOW (ref 6.5–8.1)

## 2022-10-22 LAB — CBC WITH DIFFERENTIAL (CANCER CENTER ONLY)
Abs Immature Granulocytes: 0.07 10*3/uL (ref 0.00–0.07)
Basophils Absolute: 0.1 10*3/uL (ref 0.0–0.1)
Basophils Relative: 1 %
Eosinophils Absolute: 0.2 10*3/uL (ref 0.0–0.5)
Eosinophils Relative: 2 %
HCT: 39.4 % (ref 39.0–52.0)
Hemoglobin: 12.3 g/dL — ABNORMAL LOW (ref 13.0–17.0)
Immature Granulocytes: 1 %
Lymphocytes Relative: 17 %
Lymphs Abs: 1.6 10*3/uL (ref 0.7–4.0)
MCH: 27.6 pg (ref 26.0–34.0)
MCHC: 31.2 g/dL (ref 30.0–36.0)
MCV: 88.3 fL (ref 80.0–100.0)
Monocytes Absolute: 0.6 10*3/uL (ref 0.1–1.0)
Monocytes Relative: 6 %
Neutro Abs: 6.9 10*3/uL (ref 1.7–7.7)
Neutrophils Relative %: 73 %
Platelet Count: 248 10*3/uL (ref 150–400)
RBC: 4.46 MIL/uL (ref 4.22–5.81)
RDW: 17.7 % — ABNORMAL HIGH (ref 11.5–15.5)
WBC Count: 9.4 10*3/uL (ref 4.0–10.5)
nRBC: 0 % (ref 0.0–0.2)

## 2022-10-23 ENCOUNTER — Inpatient Hospital Stay: Payer: Commercial Managed Care - PPO | Admitting: Internal Medicine

## 2022-10-23 VITALS — BP 141/86 | HR 104 | Temp 97.7°F | Resp 20 | Wt 196.8 lb

## 2022-10-23 DIAGNOSIS — C7931 Secondary malignant neoplasm of brain: Secondary | ICD-10-CM | POA: Diagnosis not present

## 2022-10-23 DIAGNOSIS — Z5112 Encounter for antineoplastic immunotherapy: Secondary | ICD-10-CM | POA: Diagnosis not present

## 2022-10-23 NOTE — Progress Notes (Signed)
[facility] at [facility] [address]  [place], [place] [address] [phone]   Interval Evaluation  Date of Service: 10/23/22 Patient Name: Bradley Goodman Patient MRN: [id number] Patient DOB: [birth year] Provider: Henreitta Leber, MD  Identifying Statement:  Bradley Goodman is a 67 y.o. male with Malignant neoplasm metastatic to brain [facility])   Primary Cancer:  Oncologic History: Oncology History  Adenocarcinoma of right lung, stage 4 (HCC)  04/25/2021 Initial Diagnosis   Adenocarcinoma of right lung, stage 4 (HCC)   04/25/2021 Cancer Staging   Staging form: Lung, AJCC 8th Edition - Clinical: Stage IVB (cT1b, cN3, cM1c) - Signed by Si Gaul, MD on 04/25/2021   05/08/2021 -  Chemotherapy   Patient is on Treatment Plan : LUNG Carboplatin (5) + Pemetrexed (500) + Pembrolizumab (200) D1 q21d Induction x 4 cycles / Maintenance Pemetrexed (500) + Pembrolizumab (200) D1 q21d      CNS Oncologic History 05/04/21: Radiosurgery to 4 brain metastases, 5 fractions to pituitary lesion  Interval History: Bradley Goodman presents today for follow up after recent MRI brain.  No new or progressive changes.  No issues with visual impairment as prior.  Diarrhea is improving with the oral vancomycin.  Denies seizures and headaches.  H+P (05/23/21) Patient presented to medical attention initially on 03/27/21 with new onset left eyelid drooping.  CNS imaging was obtained, which demonstrated multiple enhancing lesions consistent with brain metastases.  Systemic workup demonstrated lung cancer, which was confirmed through endobronchial biopsy.  He underwent single fraction radiosurgery to brain lesions, and fractionated radiosurgery to pituitary lesion.  Following radiation, eyelid drooping improved, but he developed "tunnel vision" visual deficits, described as "like looking through plastic".  This has been more or less static over the past week or so, though it did improve  transiently after dosing decadron with chemotherapy on 05/08/21.   Medications: Current Outpatient Medications on File Prior to Visit  Medication Sig Dispense Refill   cephALEXin (KEFLEX) 500 MG capsule Take 1 capsule (500 mg total) by mouth 4 (four) times daily for 7 days. 28 capsule 0   cholecalciferol (VITAMIN D) 1000 UNITS tablet Take 1,000 Units by mouth every evening.     Continuous Blood Gluc Sensor (FREESTYLE LIBRE 3 SENSOR) MISC Place 1 sensor on the skin every 14 days. Use to check glucose continuously 2 each 2   dexamethasone (DECADRON) 1 MG tablet Take 1 tablet (1 mg total) by mouth daily with breakfast. 90 tablet 1   docusate sodium (COLACE) 100 MG capsule Take 100 mg by mouth daily.     folic acid (FOLVITE) 1 MG tablet Take 1 tablet by mouth daily. 90 tablet 4   Krill Oil 300 MG CAPS Take 300 mg by mouth every evening.     levothyroxine (SYNTHROID) 75 MCG tablet Take 1 tablet (75 mcg total) by mouth daily before breakfast. 90 tablet 1   loratadine (CLARITIN) 10 MG tablet Take 10 mg by mouth every evening.      losartan (COZAAR) 50 MG tablet Take 1 and 1/2 tablets (75 mg total) by mouth daily. 135 tablet 1   mirtazapine (REMERON) 15 MG tablet Take 1 tablet by mouth at bedtime. 90 tablet 1   Multiple Vitamins-Minerals (MULTIVITAMIN GUMMIES ADULT PO) Take 2 each by mouth daily.     pantoprazole (PROTONIX) 40 MG tablet Take 1 tablet by mouth every evening. 90 tablet 1   PRESCRIPTION MEDICATION Testosterone injection     prochlorperazine (COMPAZINE)  10 MG tablet Take 1 tablet (10 mg total) by mouth every 6 (six) hours as needed for nausea or vomiting. 30 tablet 0   rosuvastatin (CRESTOR) 20 MG tablet Take 1 tablet (20 mg total) by mouth daily. 90 tablet 1   Semaglutide,0.25 or 0.5MG /DOS, (OZEMPIC, 0.25 OR 0.5 MG/DOSE,) 2 MG/3ML SOPN Inject 0.5 mg into the skin once a week. 9 mL 1   SYRINGE-NEEDLE, DISP, 3 ML 21G X 1-1/2" 3 ML MISC Use to inject testosterone every week 100 each 2    testosterone cypionate (DEPOTESTOTERONE CYPIONATE) 100 MG/ML injection Take 50mg  ( 0.65ml) and 100mg  ( 67ml) into muscle alternatively weekly. 10 mL 0   vancomycin (VANCOCIN) 125 MG capsule Take 1 capsule (125 mg total) by mouth 4 (four) times daily for 10 days. 40 capsule 0   venlafaxine XR (EFFEXOR-XR) 75 MG 24 hr capsule Take 1 capsule (75 mg total) by mouth daily with breakfast. 90 capsule 1   No current facility-administered medications on file prior to visit.    Allergies: No Known Allergies Past Medical History:  Past Medical History:  Diagnosis Date   Cervical spondylolysis    Essential hypertension    GERD (gastroesophageal reflux disease)    History of kidney stones    History of migraine    Hyperlipidemia    Hypertension    PONV (postoperative nausea and vomiting)    Type 2 diabetes mellitus ([place])    Past Surgical History:  Past Surgical History:  Procedure Laterality Date   Bilateral inguinal hernia repair     BRONCHIAL NEEDLE ASPIRATION BIOPSY  04/05/2021   Procedure: BRONCHIAL NEEDLE ASPIRATION BIOPSIES;  Surgeon: Garner Nash, DO;  Location: [facility];  Service: Pulmonary;;   COLONOSCOPY  01/23/2012   Procedure: COLONOSCOPY;  Surgeon: Daneil Dolin, MD;  [facility];  Service: Endoscopy;  Laterality: N/A;  9:30 AM   COLONOSCOPY N/A 10/11/2015   Procedure: COLONOSCOPY;  Surgeon: Daneil Dolin, MD;  [facility];  Service: Endoscopy;  Laterality: N/A;  830   COLONOSCOPY N/A 10/14/2019   Procedure: COLONOSCOPY;  Surgeon: Daneil Dolin, MD;  [facility];  Service: Endoscopy;  Laterality: N/A;  1:45   POLYPECTOMY  10/14/2019   Procedure: POLYPECTOMY;  Surgeon: Daneil Dolin, MD;  [facility];  Service: Endoscopy;;  ascending colon, descending colon   VIDEO BRONCHOSCOPY WITH ENDOBRONCHIAL ULTRASOUND N/A 04/05/2021   Procedure: VIDEO BRONCHOSCOPY WITH ENDOBRONCHIAL ULTRASOUND;  Surgeon: Garner Nash, DO;  Location: [facility];  Service: Pulmonary;  Laterality: N/A;   Social History:  Social History   Socioeconomic History   Marital status: Married    Spouse name: Not on file   Number of children: Not on file   Years of education: Not on file   Highest education level: Not on file  Occupational History   Not on file  Tobacco Use   Smoking status: Every Day    Packs/day: 0.50    Years: 30.00    Total pack years: 15.00    Types: Cigarettes    Start date: 10/30/1973   Smokeless tobacco: Never  Vaping Use   Vaping Use: Never used  Substance and Sexual Activity   Alcohol use: Yes    Alcohol/week: 0.0 standard drinks of alcohol    Comment: One drink every 6 months.   Drug use: No   Sexual activity: Yes  Other Topics Concern   Not on file  Social History Narrative  Not on file   Social Determinants of Health   Financial Resource Strain: Not on file  Food Insecurity: Not on file  Transportation Needs: Not on file  Physical Activity: Not on file  Stress: Not on file  Social Connections: Not on file  Intimate Partner Violence: Not on file   Family History:  Family History  Problem Relation Age of Onset   Hypertension Mother    Diabetes Mother    Heart attack Mother    Hypertension Father    Heart attack Father    Heart attack Brother    Colon cancer Neg Hx     Review of Systems: Constitutional: Doesn't report fevers, chills or abnormal weight loss Eyes: Doesn't report blurriness of vision Ears, nose, mouth, throat, and face: Doesn't report sore throat Respiratory: Doesn't report cough, dyspnea or wheezes Cardiovascular: Doesn't report palpitation, chest discomfort  Gastrointestinal:  Doesn't report nausea, constipation, diarrhea GU: Doesn't report incontinence Skin: Doesn't report skin rashes Neurological: Per HPI Musculoskeletal: Doesn't report joint pain Behavioral/Psych: Doesn't report anxiety  Physical Exam: There were no vitals filed for this visit.  KPS:  90. General: Alert, cooperative, pleasant, in no acute distress Head: Normal EENT: No conjunctival injection or scleral icterus.  Lungs: Resp effort normal Cardiac: Regular rate Abdomen: Non-distended abdomen Skin: No rashes cyanosis or petechiae. Extremities: No clubbing or edema  Neurologic Exam: Mental Status: Awake, alert, attentive to examiner. Oriented to self and environment. Language is fluent with intact comprehension.  Cranial Nerves: Visual acuity is grossly normal. Visual fields are full. Extra-ocular movements intact. No ptosis. Face is symmetric Motor: Tone and bulk are normal. Power is full in both arms and legs. Reflexes are symmetric, no pathologic reflexes present.  Sensory: Intact to light touch Gait: Normal.   Labs: I have reviewed the data as listed    Component Value Date/Time   NA 140 10/22/2022 0831   NA 144 01/10/2021 0849   K 3.8 10/22/2022 0831   CL 107 10/22/2022 0831   CO2 27 10/22/2022 0831   GLUCOSE 89 10/22/2022 0831   BUN 10 10/22/2022 0831   BUN 15 01/10/2021 0849   CREATININE 1.03 10/22/2022 0831   CALCIUM 8.9 10/22/2022 0831   PROT 6.3 (L) 10/22/2022 0831   PROT 6.3 01/10/2021 0849   ALBUMIN 3.6 10/22/2022 0831   ALBUMIN 4.2 01/10/2021 0849   AST 19 10/22/2022 0831   ALT 20 10/22/2022 0831   ALKPHOS 63 10/22/2022 0831   BILITOT 0.2 (L) 10/22/2022 0831   GFRNONAA >60 10/22/2022 0831   GFRAA 82 07/14/2020 0945   Lab Results  Component Value Date   WBC 9.4 10/22/2022   NEUTROABS 6.9 10/22/2022   HGB 12.3 (L) 10/22/2022   HCT 39.4 10/22/2022   MCV 88.3 10/22/2022   PLT 248 10/22/2022   Imaging:  [facility] Clinician Interpretation: I have personally reviewed the CNS images as listed.  My interpretation, in the context of the patient's clinical presentation, is stable disease  MR BRAIN W WO CONTRAST  Result Date: 10/21/2022 CLINICAL DATA:  Provided history: Malignant neoplasm metastatic to brain. Brain/CNS neoplasm, assess treatment  response. EXAM: MRI HEAD WITHOUT AND WITH CONTRAST TECHNIQUE: Multiplanar, multiecho pulse sequences of the brain and surrounding structures were obtained without and with intravenous contrast. CONTRAST:  9 mL Vueway intravenous contrast. COMPARISON:  Prior brain MRI examinations 06/15/22 and earlier FINDINGS: Brain: No age advanced or lobar predominant parenchymal atrophy. A 3 mm enhancing lesion in the left cerebellar hemisphere is unchanged (SE  13, IM 51). T2 FLAIR hyperintense signal abnormality at this site has also remained stable. The previously demonstrated punctate enhancing parenchymal lesion at the right parieto-occipital junction is not appreciated on today's exam. No new intracranial metastasis is identified. Moderate for age multifocal T2 FLAIR hyperintense signal abnormality within the bilateral cerebral white matter, non-specific but compatible with chronic small vessel ischemic disease. Minimal chronic small vessel ischemic changes are also again demonstrated within the pons. There is no acute infarct. No chronic intracranial blood products. No extra-axial fluid collection. No midline shift. Vascular: Maintained flow voids within the proximal large arterial vessels. Skull and upper cervical spine: No focal suspicious marrow lesion. Sinuses/Orbits: No mass or acute finding within the imaged orbits. Minimal mucosal thickening within the bilateral ethmoid and right maxillary sinuses. Other: Small-volume fluid within the bilateral mastoid air cells. Unchanged chronic round-to-ovoid T1 hyperintense lesion within the anterior maxilla to the left, non-specific but possibly reflecting a residual cyst (SE 11, IM 10). IMPRESSION: 1. Unchanged 3 mm enhancing lesion in the left cerebellar hemisphere. 2. The previously demonstrated punctate enhancing parenchymal lesion at the right parieto-occipital junction is not appreciated on today's exam. 3. No new intracranial metastasis is identified. Electronically Signed    By: Jackey Loge D.O.   On: 10/21/2022 14:57   CT Chest W Contrast  Result Date: 10/12/2022 CLINICAL DATA:  Non-small-cell lung cancer.  Restaging. 07/20/2022 EXAM: CT CHEST, ABDOMEN, AND PELVIS WITH CONTRAST TECHNIQUE: Multidetector CT imaging of the chest, abdomen and pelvis was performed following the standard protocol during bolus administration of intravenous contrast. RADIATION DOSE REDUCTION: This exam was performed according to the departmental dose-optimization program which includes automated exposure control, adjustment of the mA and/or kV according to patient size and/or use of iterative reconstruction technique. CONTRAST:  OMNIPAQUE IOHEXOL 300 MG/ML  SOLN COMPARISON:  07/20/2022 FINDINGS: CT CHEST FINDINGS Cardiovascular: The heart size is normal. No substantial pericardial effusion. Coronary artery calcification is evident. Mild atherosclerotic calcification is noted in the wall of the thoracic aorta. Mediastinum/Nodes: No mediastinal lymphadenopathy. There is no hilar lymphadenopathy. The esophagus has normal imaging features. There is no axillary lymphadenopathy. Lungs/Pleura: Biapical pleuroparenchymal scarring again noted. 7 mm right upper lobe pulmonary nodule measured previously is 7 mm again today on 50/505 5 mm posterior right upper lobe nodule on the same image was 5 mm previously, remeasured. Several additional scattered tiny solid and ground-glass nodules are also unchanged. Including the dominant ground-glass opacity measuring 7 mm in the peripheral left upper lobe (78/505). No focal airspace consolidation. There is no evidence of pleural effusion. Stable scarring medial right middle lobe. Subtle changes of centrilobular emphysema noted. Musculoskeletal: No worrisome lytic or sclerotic osseous abnormality. CT ABDOMEN PELVIS FINDINGS Hepatobiliary: No suspicious focal abnormality within the liver parenchyma. Tiny hypodensities at the gallbladder fossa and posterior right liver  inferiorly are stable, likely benign. There is no evidence for gallstones, gallbladder wall thickening, or pericholecystic fluid. No intrahepatic or extrahepatic biliary dilation. Pancreas: No focal mass lesion. No dilatation of the main duct. No intraparenchymal cyst. No peripancreatic edema. Spleen: No splenomegaly. No focal mass lesion. Adrenals/Urinary Tract: No adrenal nodule or mass. Right kidney unremarkable. 11 mm nonobstructing stone again identified interpolar left kidney. No evidence for hydroureter. The urinary bladder appears normal for the degree of distention. Stomach/Bowel: Stomach is unremarkable. No gastric wall thickening. No evidence of outlet obstruction. Duodenum is normally positioned as is the ligament of Treitz. No small bowel wall thickening. No small bowel dilatation. The terminal ileum  is normal. The appendix is normal. No gross colonic mass. No colonic wall thickening. Vascular/Lymphatic: There is moderate atherosclerotic calcification of the abdominal aorta without aneurysm. There is no gastrohepatic or hepatoduodenal ligament lymphadenopathy. No retroperitoneal or mesenteric lymphadenopathy. No pelvic sidewall lymphadenopathy. Reproductive: Prostate gland is enlarged. Other: No intraperitoneal free fluid. Musculoskeletal: No worrisome lytic or sclerotic osseous abnormality. IMPRESSION: 1. Stable exam. No new or progressive findings in the chest, abdomen, or pelvis to suggest recurrent or metastatic disease. 2. Stable bilateral pulmonary nodules measuring up to 7 mm. 3. 11 mm nonobstructing left renal stone. 4. Prostatomegaly. 5. Aortic Atherosclerosis (ICD10-I70.0) and Emphysema (ICD10-J43.9). Electronically Signed   By: Kennith Center M.D.   On: 10/12/2022 09:44   CT Abdomen Pelvis W Contrast  Result Date: 10/12/2022 CLINICAL DATA:  Non-small-cell lung cancer.  Restaging. 07/20/2022 EXAM: CT CHEST, ABDOMEN, AND PELVIS WITH CONTRAST TECHNIQUE: Multidetector CT imaging of the chest,  abdomen and pelvis was performed following the standard protocol during bolus administration of intravenous contrast. RADIATION DOSE REDUCTION: This exam was performed according to the departmental dose-optimization program which includes automated exposure control, adjustment of the mA and/or kV according to patient size and/or use of iterative reconstruction technique. CONTRAST:  OMNIPAQUE IOHEXOL 300 MG/ML  SOLN COMPARISON:  07/20/2022 FINDINGS: CT CHEST FINDINGS Cardiovascular: The heart size is normal. No substantial pericardial effusion. Coronary artery calcification is evident. Mild atherosclerotic calcification is noted in the wall of the thoracic aorta. Mediastinum/Nodes: No mediastinal lymphadenopathy. There is no hilar lymphadenopathy. The esophagus has normal imaging features. There is no axillary lymphadenopathy. Lungs/Pleura: Biapical pleuroparenchymal scarring again noted. 7 mm right upper lobe pulmonary nodule measured previously is 7 mm again today on 50/505 5 mm posterior right upper lobe nodule on the same image was 5 mm previously, remeasured. Several additional scattered tiny solid and ground-glass nodules are also unchanged. Including the dominant ground-glass opacity measuring 7 mm in the peripheral left upper lobe (78/505). No focal airspace consolidation. There is no evidence of pleural effusion. Stable scarring medial right middle lobe. Subtle changes of centrilobular emphysema noted. Musculoskeletal: No worrisome lytic or sclerotic osseous abnormality. CT ABDOMEN PELVIS FINDINGS Hepatobiliary: No suspicious focal abnormality within the liver parenchyma. Tiny hypodensities at the gallbladder fossa and posterior right liver inferiorly are stable, likely benign. There is no evidence for gallstones, gallbladder wall thickening, or pericholecystic fluid. No intrahepatic or extrahepatic biliary dilation. Pancreas: No focal mass lesion. No dilatation of the main duct. No intraparenchymal  cyst. No peripancreatic edema. Spleen: No splenomegaly. No focal mass lesion. Adrenals/Urinary Tract: No adrenal nodule or mass. Right kidney unremarkable. 11 mm nonobstructing stone again identified interpolar left kidney. No evidence for hydroureter. The urinary bladder appears normal for the degree of distention. Stomach/Bowel: Stomach is unremarkable. No gastric wall thickening. No evidence of outlet obstruction. Duodenum is normally positioned as is the ligament of Treitz. No small bowel wall thickening. No small bowel dilatation. The terminal ileum is normal. The appendix is normal. No gross colonic mass. No colonic wall thickening. Vascular/Lymphatic: There is moderate atherosclerotic calcification of the abdominal aorta without aneurysm. There is no gastrohepatic or hepatoduodenal ligament lymphadenopathy. No retroperitoneal or mesenteric lymphadenopathy. No pelvic sidewall lymphadenopathy. Reproductive: Prostate gland is enlarged. Other: No intraperitoneal free fluid. Musculoskeletal: No worrisome lytic or sclerotic osseous abnormality. IMPRESSION: 1. Stable exam. No new or progressive findings in the chest, abdomen, or pelvis to suggest recurrent or metastatic disease. 2. Stable bilateral pulmonary nodules measuring up to 7 mm. 3. 11 mm nonobstructing  left renal stone. 4. Prostatomegaly. 5. Aortic Atherosclerosis (ICD10-I70.0) and Emphysema (ICD10-J43.9). Electronically Signed   By: Kennith Center M.D.   On: 10/12/2022 09:44     Assessment/Plan Malignant neoplasm metastatic to brain [facility])  Bradley Goodman is clinically and radiographically stable today.  Clinically stable today, on oral vancomycin for mild case of C-diff through Dr. Arbutus Ped.    For pan-hypopit, will continue to follow with Dr. Fransico Him.    He will con't to undergo chemotherapy with Dr. Arbutus Ped.  We appreciate the opportunity to participate in the care of Bradley Goodman.   We ask that Bradley Goodman return to clinic in 4 months  following next brain MRI, or sooner as needed.  All questions were answered. The patient knows to call the clinic with any problems, questions or concerns. No barriers to learning were detected.  The total time spent in the encounter was 30 minutes and more than 50% was on counseling and review of test results   Henreitta Leber, MD Medical Director of [facility] at [facility] 10/23/22 9:23 AM

## 2022-10-24 LAB — CDIFF NAA+O+P+STOOL CULTURE
E coli, Shiga toxin Assay: NEGATIVE
Toxigenic C. Difficile by PCR: POSITIVE — AB

## 2022-10-25 ENCOUNTER — Other Ambulatory Visit: Payer: Self-pay | Admitting: Radiation Therapy

## 2022-10-31 ENCOUNTER — Other Ambulatory Visit: Payer: Self-pay

## 2022-11-01 ENCOUNTER — Ambulatory Visit: Payer: Self-pay

## 2022-11-01 ENCOUNTER — Other Ambulatory Visit: Payer: Self-pay

## 2022-11-01 ENCOUNTER — Inpatient Hospital Stay: Payer: Commercial Managed Care - PPO

## 2022-11-01 ENCOUNTER — Other Ambulatory Visit: Payer: Commercial Managed Care - PPO

## 2022-11-01 VITALS — BP 159/102 | HR 97 | Temp 98.6°F | Resp 16 | Wt 191.8 lb

## 2022-11-01 DIAGNOSIS — Z5112 Encounter for antineoplastic immunotherapy: Secondary | ICD-10-CM | POA: Diagnosis not present

## 2022-11-01 DIAGNOSIS — C3491 Malignant neoplasm of unspecified part of right bronchus or lung: Secondary | ICD-10-CM

## 2022-11-01 LAB — CBC WITH DIFFERENTIAL (CANCER CENTER ONLY)
Abs Immature Granulocytes: 0.07 10*3/uL (ref 0.00–0.07)
Basophils Absolute: 0.1 10*3/uL (ref 0.0–0.1)
Basophils Relative: 1 %
Eosinophils Absolute: 0.2 10*3/uL (ref 0.0–0.5)
Eosinophils Relative: 1 %
HCT: 39.6 % (ref 39.0–52.0)
Hemoglobin: 12.3 g/dL — ABNORMAL LOW (ref 13.0–17.0)
Immature Granulocytes: 1 %
Lymphocytes Relative: 13 %
Lymphs Abs: 1.6 10*3/uL (ref 0.7–4.0)
MCH: 27.4 pg (ref 26.0–34.0)
MCHC: 31.1 g/dL (ref 30.0–36.0)
MCV: 88.2 fL (ref 80.0–100.0)
Monocytes Absolute: 0.7 10*3/uL (ref 0.1–1.0)
Monocytes Relative: 5 %
Neutro Abs: 10.1 10*3/uL — ABNORMAL HIGH (ref 1.7–7.7)
Neutrophils Relative %: 79 %
Platelet Count: 266 10*3/uL (ref 150–400)
RBC: 4.49 MIL/uL (ref 4.22–5.81)
RDW: 17.4 % — ABNORMAL HIGH (ref 11.5–15.5)
WBC Count: 12.7 10*3/uL — ABNORMAL HIGH (ref 4.0–10.5)
nRBC: 0 % (ref 0.0–0.2)

## 2022-11-01 LAB — CMP (CANCER CENTER ONLY)
ALT: 24 U/L (ref 0–44)
AST: 20 U/L (ref 15–41)
Albumin: 3.6 g/dL (ref 3.5–5.0)
Alkaline Phosphatase: 60 U/L (ref 38–126)
Anion gap: 5 (ref 5–15)
BUN: 14 mg/dL (ref 8–23)
CO2: 30 mmol/L (ref 22–32)
Calcium: 9.2 mg/dL (ref 8.9–10.3)
Chloride: 107 mmol/L (ref 98–111)
Creatinine: 1.01 mg/dL (ref 0.61–1.24)
GFR, Estimated: >60 mL/min (ref >60)
Glucose, Bld: 120 mg/dL — ABNORMAL HIGH (ref 70–99)
Potassium: 3.6 mmol/L (ref 3.5–5.1)
Sodium: 142 mmol/L (ref 135–145)
Total Bilirubin: 0.2 mg/dL — ABNORMAL LOW (ref 0.3–1.2)
Total Protein: 6.4 g/dL — ABNORMAL LOW (ref 6.5–8.1)

## 2022-11-01 MED ORDER — SODIUM CHLORIDE 0.9 % IV SOLN
500.0000 mg/m2 | Freq: Once | INTRAVENOUS | Status: AC
  Administered 2022-11-01: 1000 mg via INTRAVENOUS
  Filled 2022-11-01: qty 40

## 2022-11-01 MED ORDER — SODIUM CHLORIDE 0.9 % IV SOLN
200.0000 mg | Freq: Once | INTRAVENOUS | Status: AC
  Administered 2022-11-01: 200 mg via INTRAVENOUS
  Filled 2022-11-01: qty 8

## 2022-11-01 MED ORDER — SODIUM CHLORIDE 0.9 % IV SOLN: Freq: Once | INTRAVENOUS | Status: AC

## 2022-11-01 MED ORDER — PROCHLORPERAZINE MALEATE 10 MG PO TABS
10.0000 mg | ORAL_TABLET | Freq: Once | ORAL | Status: AC
  Administered 2022-11-01: 10 mg via ORAL
  Filled 2022-11-01: qty 1

## 2022-11-01 NOTE — Progress Notes (Signed)
Ok to treat today with a heart rate of 101-105 per Cassie Heilingoetter PA- C.

## 2022-11-01 NOTE — Patient Instructions (Signed)
[facility]  Discharge Instructions: Thank you for choosing [facility] to provide your oncology and hematology care.   If you have a lab appointment with the [facility], please go directly to the [facility] and check in at the registration area.   Wear comfortable clothing and clothing appropriate for easy access to any Portacath or PICC line.   We strive to give you quality time with your provider. You may need to reschedule your appointment if you arrive late (15 or more minutes).  Arriving late affects you and other patients whose appointments are after yours.  Also, if you miss three or more appointments without notifying the office, you may be dismissed from the clinic at the provider's discretion.      For prescription refill requests, have your pharmacy contact our office and allow 72 hours for refills to be completed.    Today you received the following chemotherapy and/or immunotherapy agents: pembrolizumab and pemetrexed      To help prevent nausea and vomiting after your treatment, we encourage you to take your nausea medication as directed.  BELOW ARE SYMPTOMS THAT SHOULD BE REPORTED IMMEDIATELY: *FEVER GREATER THAN 100.4 F (38 C) OR HIGHER *CHILLS OR SWEATING *NAUSEA AND VOMITING THAT IS NOT CONTROLLED WITH YOUR NAUSEA MEDICATION *UNUSUAL SHORTNESS OF BREATH *UNUSUAL BRUISING OR BLEEDING *URINARY PROBLEMS (pain or burning when urinating, or frequent urination) *BOWEL PROBLEMS (unusual diarrhea, constipation, pain near the anus) TENDERNESS IN MOUTH AND THROAT WITH OR WITHOUT PRESENCE OF ULCERS (sore throat, sores in mouth, or a toothache) UNUSUAL RASH, SWELLING OR PAIN  UNUSUAL VAGINAL DISCHARGE OR ITCHING   Items with * indicate a potential emergency and should be followed up as soon as possible or go to the Emergency Department if any problems should occur.  Please show the CHEMOTHERAPY ALERT CARD or IMMUNOTHERAPY  ALERT CARD at check-in to the Emergency Department and triage nurse.  Should you have questions after your visit or need to cancel or reschedule your appointment, please contact [facility]  Dept: [phone]  and follow the prompts.  Office hours are 8:00 a.m. to 4:30 p.m. Monday - Friday. Please note that voicemails left after 4:00 p.m. may not be returned until the following business day.  We are closed weekends and major holidays. You have access to a nurse at all times for urgent questions. Please call the main number to the clinic Dept: [phone] and follow the prompts.   For any non-urgent questions, you may also contact your provider using MyChart. We now offer e-Visits for anyone 56 and older to request care online for non-urgent symptoms. For details visit [email or web address].   Also download the MyChart app! Go to the app store, search "MyChart", open the app, select [facility], and log in with your MyChart username and password.  Masks are optional in the cancer centers. If you would like for your care team to wear a mask while they are taking care of you, please let them know. You may have one support person who is at least 66 years old accompany you for your appointments.

## 2022-11-02 ENCOUNTER — Other Ambulatory Visit: Payer: Self-pay | Admitting: Radiation Therapy

## 2022-11-05 ENCOUNTER — Ambulatory Visit: Payer: 59

## 2022-11-05 ENCOUNTER — Ambulatory Visit: Payer: 59 | Admitting: Physician Assistant

## 2022-11-05 ENCOUNTER — Other Ambulatory Visit: Payer: 59

## 2022-11-07 ENCOUNTER — Other Ambulatory Visit: Payer: Self-pay

## 2022-11-07 ENCOUNTER — Emergency Department (HOSPITAL_COMMUNITY)
Admission: EM | Admit: 2022-11-07 | Discharge: 2022-11-08 | Disposition: A | Discharge status: Home / Self Care | Payer: Commercial Managed Care - PPO | Attending: Emergency Medicine | Admitting: Emergency Medicine

## 2022-11-07 ENCOUNTER — Encounter (HOSPITAL_COMMUNITY): Payer: Self-pay

## 2022-11-07 ENCOUNTER — Emergency Department (HOSPITAL_COMMUNITY): Payer: Commercial Managed Care - PPO

## 2022-11-07 DIAGNOSIS — C3490 Malignant neoplasm of unspecified part of unspecified bronchus or lung: Secondary | ICD-10-CM | POA: Insufficient documentation

## 2022-11-07 DIAGNOSIS — R0602 Shortness of breath: Secondary | ICD-10-CM | POA: Diagnosis not present

## 2022-11-07 DIAGNOSIS — Z1152 Encounter for screening for COVID-19: Secondary | ICD-10-CM | POA: Diagnosis not present

## 2022-11-07 DIAGNOSIS — R000 Tachycardia, unspecified: Secondary | ICD-10-CM | POA: Diagnosis not present

## 2022-11-07 DIAGNOSIS — R059 Cough, unspecified: Secondary | ICD-10-CM | POA: Diagnosis not present

## 2022-11-07 DIAGNOSIS — R079 Chest pain, unspecified: Secondary | ICD-10-CM | POA: Diagnosis not present

## 2022-11-07 DIAGNOSIS — R911 Solitary pulmonary nodule: Secondary | ICD-10-CM | POA: Diagnosis not present

## 2022-11-07 DIAGNOSIS — J069 Acute upper respiratory infection, unspecified: Secondary | ICD-10-CM | POA: Diagnosis not present

## 2022-11-07 NOTE — ED Triage Notes (Signed)
Pt reports nasal congestion, cough, shortness of breath.

## 2022-11-08 ENCOUNTER — Encounter: Payer: Self-pay | Admitting: Internal Medicine

## 2022-11-08 ENCOUNTER — Emergency Department (HOSPITAL_COMMUNITY): Payer: Commercial Managed Care - PPO

## 2022-11-08 DIAGNOSIS — R0602 Shortness of breath: Secondary | ICD-10-CM | POA: Diagnosis not present

## 2022-11-08 DIAGNOSIS — R059 Cough, unspecified: Secondary | ICD-10-CM | POA: Diagnosis not present

## 2022-11-08 LAB — URINALYSIS, W/ REFLEX TO CULTURE (INFECTION SUSPECTED)
Bilirubin Urine: NEGATIVE
Glucose, UA: NEGATIVE mg/dL
Ketones, ur: NEGATIVE mg/dL
Leukocytes,Ua: NEGATIVE
Nitrite: NEGATIVE
Protein, ur: NEGATIVE mg/dL
Specific Gravity, Urine: <1.005 — ABNORMAL LOW (ref 1.005–1.030)
pH: 6 (ref 5.0–8.0)

## 2022-11-08 LAB — COMPREHENSIVE METABOLIC PANEL
ALT: 26 U/L (ref 0–44)
AST: 27 U/L (ref 15–41)
Albumin: 3.2 g/dL — ABNORMAL LOW (ref 3.5–5.0)
Alkaline Phosphatase: 57 U/L (ref 38–126)
Anion gap: 10 (ref 5–15)
BUN: 10 mg/dL (ref 8–23)
CO2: 24 mmol/L (ref 22–32)
Calcium: 8.6 mg/dL — ABNORMAL LOW (ref 8.9–10.3)
Chloride: 103 mmol/L (ref 98–111)
Creatinine, Ser: 1.08 mg/dL (ref 0.61–1.24)
GFR, Estimated: >60 mL/min (ref >60)
Glucose, Bld: 159 mg/dL — ABNORMAL HIGH (ref 70–99)
Potassium: 3.5 mmol/L (ref 3.5–5.1)
Sodium: 137 mmol/L (ref 135–145)
Total Bilirubin: 0.6 mg/dL (ref 0.3–1.2)
Total Protein: 6.9 g/dL (ref 6.5–8.1)

## 2022-11-08 LAB — CBC WITH DIFFERENTIAL/PLATELET
Abs Immature Granulocytes: 0.04 10*3/uL (ref 0.00–0.07)
Basophils Absolute: 0 10*3/uL (ref 0.0–0.1)
Basophils Relative: 1 %
Eosinophils Absolute: 0.1 10*3/uL (ref 0.0–0.5)
Eosinophils Relative: 1 %
HCT: 38.5 % — ABNORMAL LOW (ref 39.0–52.0)
Hemoglobin: 11.7 g/dL — ABNORMAL LOW (ref 13.0–17.0)
Immature Granulocytes: 1 %
Lymphocytes Relative: 20 %
Lymphs Abs: 1.3 10*3/uL (ref 0.7–4.0)
MCH: 27 pg (ref 26.0–34.0)
MCHC: 30.4 g/dL (ref 30.0–36.0)
MCV: 88.9 fL (ref 80.0–100.0)
Monocytes Absolute: 0.4 10*3/uL (ref 0.1–1.0)
Monocytes Relative: 6 %
Neutro Abs: 4.6 10*3/uL (ref 1.7–7.7)
Neutrophils Relative %: 71 %
Platelets: 193 10*3/uL (ref 150–400)
RBC: 4.33 MIL/uL (ref 4.22–5.81)
RDW: 17.4 % — ABNORMAL HIGH (ref 11.5–15.5)
WBC: 6.5 10*3/uL (ref 4.0–10.5)
nRBC: 0.3 % — ABNORMAL HIGH (ref 0.0–0.2)

## 2022-11-08 LAB — LACTIC ACID, PLASMA
Lactic Acid, Venous: 1.7 mmol/L (ref 0.5–1.9)
Lactic Acid, Venous: 1.8 mmol/L (ref 0.5–1.9)

## 2022-11-08 LAB — APTT: aPTT: 31 seconds (ref 24–36)

## 2022-11-08 LAB — PROTIME-INR
INR: 1.1 (ref 0.8–1.2)
Prothrombin Time: 13.6 seconds (ref 11.4–15.2)

## 2022-11-08 LAB — RESP PANEL BY RT-PCR (RSV, FLU A&B, COVID)  RVPGX2
Influenza A by PCR: NEGATIVE
Influenza B by PCR: NEGATIVE
Resp Syncytial Virus by PCR: NEGATIVE
SARS Coronavirus 2 by RT PCR: NEGATIVE

## 2022-11-08 LAB — BRAIN NATRIURETIC PEPTIDE: B Natriuretic Peptide: 22 pg/mL (ref 0.0–100.0)

## 2022-11-08 LAB — TROPONIN I (HIGH SENSITIVITY)
Troponin I (High Sensitivity): 4 ng/L (ref <18)
Troponin I (High Sensitivity): 4 ng/L (ref <18)

## 2022-11-08 MED ORDER — IOHEXOL 350 MG/ML SOLN
100.0000 mL | Freq: Once | INTRAVENOUS | Status: AC | PRN
  Administered 2022-11-08: 100 mL via INTRAVENOUS

## 2022-11-08 MED ORDER — ACETAMINOPHEN 500 MG PO TABS
1000.0000 mg | ORAL_TABLET | Freq: Once | ORAL | Status: AC
  Administered 2022-11-08: 1000 mg via ORAL
  Filled 2022-11-08: qty 2

## 2022-11-08 MED ORDER — LACTATED RINGERS IV BOLUS
1000.0000 mL | Freq: Once | INTRAVENOUS | Status: AC
  Administered 2022-11-08: 1000 mL via INTRAVENOUS

## 2022-11-08 NOTE — Telephone Encounter (Signed)
Chart Review.  No changes made.

## 2022-11-08 NOTE — ED Notes (Signed)
Patient transported to CT 

## 2022-11-08 NOTE — ED Provider Notes (Signed)
[facility] Provider Note   CSN: [id number] Arrival date & time: 11/07/22  2253     History  Chief Complaint  Patient presents with   Shortness of Breath   Chest Pain    LEVELLE EDELEN is a 67 y.o. male.  67 year old male with history of non-small cell lung cancer currently on chemotherapy.  Has had significant improvement with his treatment so far.  States that he has been having cough, congestion and chills with some shortness of breath and chest pain recently.  States that he has had decreased appetite as well.  No sick contacts.  Last had chemo about a week ago.  No fevers.   Shortness of Breath Associated symptoms: chest pain   Chest Pain Associated symptoms: shortness of breath        Home Medications Prior to Admission medications   Medication Sig Start Date End Date Taking? Authorizing Provider  cholecalciferol (VITAMIN D) 1000 UNITS tablet Take 1,000 Units by mouth every evening.    [provider]  Continuous Blood Gluc Sensor (FREESTYLE LIBRE 3 SENSOR) MISC Place 1 sensor on the skin every 14 days. Use to check glucose continuously 10/03/22   [name], Connye Burkitt, FNP  dexamethasone (DECADRON) 1 MG tablet Take 1 tablet (1 mg total) by mouth daily with breakfast. 09/18/22   Cassandria Anger, MD  docusate sodium (COLACE) 100 MG capsule Take 100 mg by mouth daily. Patient not taking: Reported on 10/23/2022    [provider]  folic acid (FOLVITE) 1 MG tablet Take 1 tablet by mouth daily. 07/31/22   [name], FNP  Krill Oil 300 MG CAPS Take 300 mg by mouth every evening.    [provider]  levothyroxine (SYNTHROID) 75 MCG tablet Take 1 tablet (75 mcg total) by mouth daily before breakfast. 09/18/22   Nida, Marella Chimes, MD  loratadine (CLARITIN) 10 MG tablet Take 10 mg by mouth every evening.     [provider]  losartan (COZAAR) 50 MG tablet Take 1 and 1/2 tablets (75 mg total)  by mouth daily. 07/31/22   [name], FNP  mirtazapine (REMERON) 15 MG tablet Take 1 tablet by mouth at bedtime. 07/31/22   [name], FNP  Multiple Vitamins-Minerals (MULTIVITAMIN GUMMIES ADULT PO) Take 2 each by mouth daily.    [provider]  pantoprazole (PROTONIX) 40 MG tablet Take 1 tablet by mouth every evening. 07/31/22   [name], FNP  PRESCRIPTION MEDICATION Testosterone injection    [provider]  prochlorperazine (COMPAZINE) 10 MG tablet Take 1 tablet (10 mg total) by mouth every 6 (six) hours as needed for nausea or vomiting. 04/25/21   Curt Bears, MD  rosuvastatin (CRESTOR) 20 MG tablet Take 1 tablet (20 mg total) by mouth daily. 07/31/22   [name], FNP  Semaglutide,0.25 or 0.5MG /DOS, (OZEMPIC, 0.25 OR 0.5 MG/DOSE,) 2 MG/3ML SOPN Inject 0.5 mg into the skin once a week. 10/16/22   [name], FNP  SYRINGE-NEEDLE, DISP, 3 ML 21G X 1-1/2" 3 ML MISC Use to inject testosterone every week 09/26/21   Cassandria Anger, MD  testosterone cypionate (DEPOTESTOTERONE CYPIONATE) 100 MG/ML injection Take 50mg  ( 0.11ml) and 100mg  ( 26ml) into muscle alternatively weekly. 09/18/22   Cassandria Anger, MD  venlafaxine XR (EFFEXOR-XR) 75 MG 24 hr capsule Take 1 capsule (75 mg total) by mouth daily with breakfast. 07/31/22   [name], Connye Burkitt, FNP  Allergies    Patient has no known allergies.    Review of Systems   Review of Systems  Respiratory:  Positive for shortness of breath.   Cardiovascular:  Positive for chest pain.    Physical Exam Updated Vital Signs BP (!) 140/89   Pulse (!) 102   Temp 100 F (37.8 C) (Temporal)   Resp 18   Ht 5\' 11"  (1.803 m)   Wt 86.6 kg   SpO2 96%   BMI 26.64 kg/m  Physical Exam Vitals and nursing note reviewed.  Constitutional:      Appearance: He is well-developed.  HENT:     Head: Normocephalic and atraumatic.  Cardiovascular:     Rate and Rhythm: Tachycardia present.  Pulmonary:      Effort: Pulmonary effort is normal. No tachypnea or respiratory distress.     Breath sounds: No decreased breath sounds or wheezing.  Abdominal:     General: There is no distension.  Musculoskeletal:        General: Normal range of motion.     Cervical back: Normal range of motion.     Right lower leg: Edema present.     Left lower leg: Edema present.  Skin:    General: Skin is warm and dry.  Neurological:     Mental Status: He is alert.     ED Results / Procedures / Treatments   Labs (all labs ordered are listed, but only abnormal results are displayed) Labs Reviewed  COMPREHENSIVE METABOLIC PANEL - Abnormal; Notable for the following components:      Result Value   Glucose, Bld 159 (*)    Calcium 8.6 (*)    Albumin 3.2 (*)    All other components within normal limits  CBC WITH DIFFERENTIAL/PLATELET - Abnormal; Notable for the following components:   Hemoglobin 11.7 (*)    HCT 38.5 (*)    RDW 17.4 (*)    nRBC 0.3 (*)    All other components within normal limits  URINALYSIS, W/ REFLEX TO CULTURE (INFECTION SUSPECTED) - Abnormal; Notable for the following components:   Specific Gravity, Urine <1.005 (*)    Hgb urine dipstick SMALL (*)    Bacteria, UA RARE (*)    All other components within normal limits  RESP PANEL BY RT-PCR (RSV, FLU A&B, COVID)  RVPGX2  CULTURE, BLOOD (ROUTINE X 2)  CULTURE, BLOOD (ROUTINE X 2)  LACTIC ACID, PLASMA  LACTIC ACID, PLASMA  PROTIME-INR  APTT  BRAIN NATRIURETIC PEPTIDE  TROPONIN I (HIGH SENSITIVITY)  TROPONIN I (HIGH SENSITIVITY)    EKG None  Radiology CT Angio Chest PE W and/or Wo Contrast  Result Date: 11/08/2022 CLINICAL DATA:  Cough, shortness of breath.  History of lung cancer. EXAM: CT ANGIOGRAPHY CHEST WITH CONTRAST TECHNIQUE: Multidetector CT imaging of the chest was performed using the standard protocol during bolus administration of intravenous contrast. Multiplanar CT image reconstructions and MIPs were obtained to  evaluate the vascular anatomy. RADIATION DOSE REDUCTION: This exam was performed according to the departmental dose-optimization program which includes automated exposure control, adjustment of the mA and/or kV according to patient size and/or use of iterative reconstruction technique. CONTRAST:  117mL OMNIPAQUE IOHEXOL 350 MG/ML SOLN COMPARISON:  Chest radiograph dated 11/07/2022. CT chest dated 10/11/2022. FINDINGS: Cardiovascular: Satisfactory opacification of the bilateral pulmonary arteries to lobar level. Evaluation of the bilateral lower lobes is constrained by suboptimal bolus timing. Within that constraint, there is no evidence of pulmonary embolism. Although not tailored for evaluation of  the thoracic aorta, there is no evidence of thoracic aortic aneurysm or dissection. Atherosclerotic calcifications of the aortic arch. Heart is normal in size.  Small pericardial effusion. Mediastinum/Nodes: No suspicious mediastinal lymphadenopathy. Visualized thyroid is unremarkable. Lungs/Pleura: Biapical pleural-parenchymal scarring. Scattered bilateral pulmonary nodules, including a stable 7 mm nodule in the anterior right upper lobe (series 6/image 39. Mild paraseptal emphysematous changes, upper lung predominant. No focal consolidation. No pleural effusion or pneumothorax. Upper Abdomen: Visualized upper abdomen is grossly unremarkable. Musculoskeletal: Visualized osseous structures are within normal limits. Review of the MIP images confirms the above findings. IMPRESSION: No evidence of pulmonary embolism. Stable bilateral pulmonary nodules measuring up to 7 mm. No findings suspicious for recurrent or metastatic disease. Aortic Atherosclerosis (ICD10-I70.0) and Emphysema (ICD10-J43.9). Electronically Signed   By: Julian Hy M.D.   On: 11/08/2022 01:31   DG Chest 2 View  Result Date: 11/07/2022 CLINICAL DATA:  Chest pain. Cough, shortness of breath, nasal congestion. EXAM: CHEST - 2 VIEW COMPARISON:   Chest CT 10/11/2022 FINDINGS: The heart is normal in size. Stable mediastinal contours. Hyperinflation with mild bronchial thickening. There is biapical pleuroparenchymal scarring. Pulmonary nodules on CT are not seen by radiograph. No confluent airspace disease. Blunting of the costophrenic angles is likely due to hyperinflation rather than pleural effusions. No pneumothorax. No pulmonary edema. IMPRESSION: Chronic hyperinflation with mild bronchial thickening. No acute findings. Electronically Signed   By: Keith Rake M.D.   On: 11/07/2022 23:41    Procedures Procedures    Medications Ordered in ED Medications  lactated ringers bolus 1,000 mL (0 mLs Intravenous Stopped 11/08/22 0216)  acetaminophen (TYLENOL) tablet 1,000 mg (1,000 mg Oral Given 11/08/22 0102)  iohexol (OMNIPAQUE) 350 MG/ML injection 100 mL (100 mLs Intravenous Contrast Given 11/08/22 0109)  lactated ringers bolus 1,000 mL (0 mLs Intravenous Stopped 11/08/22 0502)    ED Course/ Medical Decision Making/ A&P                             Medical Decision Making Amount and/or Complexity of Data Reviewed Labs: ordered. Radiology: ordered. ECG/medicine tests: ordered.  Risk OTC drugs. Prescription drug management.   Secondary to his chemotherapy status, tachycardia and upper story symptoms blood cultures and other sepsis type labs were ordered.  Patient had a clear x-ray as interpreted by me so CT scan done to evaluate for PE with his history of cancer, mild hypoxia and tachycardia without other clear etiology and this was also negative for any infiltrates, fluid or PE on my interpretation.  COVID/flu were negative.  Labs are reassuring.  Specifically no white count or lactic acid elevation or depression.  Was given couple liters of fluid and his heart rate responded appropriately patient felt better.  I discussed with Dr. Delton Coombes who felt that we did all the appropriate treatments and did not recommend any further treatments  and the patient was feeling okay to follow-up with his primary oncologist.  Will send a message to Dr. Earlie Server requesting at least a phone call if not a follow-up appointment.  Multiple discussions with patient feels stable and improved and ready for discharge.  Final Clinical Impression(s) / ED Diagnoses Final diagnoses:  Upper respiratory tract infection, unspecified type  Tachycardia    Rx / DC Orders ED Discharge Orders     None         Annaly Skop, Corene Cornea, MD 11/08/22 [id number]

## 2022-11-12 ENCOUNTER — Ambulatory Visit: Payer: Self-pay | Admitting: Internal Medicine

## 2022-11-12 ENCOUNTER — Ambulatory Visit: Payer: Self-pay

## 2022-11-12 ENCOUNTER — Other Ambulatory Visit: Payer: Self-pay

## 2022-11-13 LAB — CULTURE, BLOOD (ROUTINE X 2)
Culture: NO GROWTH
Culture: NO GROWTH
Special Requests: ADEQUATE
Special Requests: ADEQUATE

## 2022-11-14 ENCOUNTER — Other Ambulatory Visit (HOSPITAL_COMMUNITY): Payer: Self-pay

## 2022-11-14 ENCOUNTER — Other Ambulatory Visit: Payer: Self-pay

## 2022-11-21 ENCOUNTER — Inpatient Hospital Stay: Payer: Commercial Managed Care - PPO | Attending: Physician Assistant

## 2022-11-21 ENCOUNTER — Encounter: Payer: Self-pay | Admitting: Internal Medicine

## 2022-11-21 ENCOUNTER — Inpatient Hospital Stay: Payer: Commercial Managed Care - PPO

## 2022-11-21 ENCOUNTER — Inpatient Hospital Stay (HOSPITAL_BASED_OUTPATIENT_CLINIC_OR_DEPARTMENT_OTHER): Payer: Commercial Managed Care - PPO | Admitting: Internal Medicine

## 2022-11-21 VITALS — BP 141/89 | HR 107 | Temp 100.1°F | Resp 19 | Wt 191.5 lb

## 2022-11-21 DIAGNOSIS — Z5112 Encounter for antineoplastic immunotherapy: Secondary | ICD-10-CM | POA: Diagnosis not present

## 2022-11-21 DIAGNOSIS — F17210 Nicotine dependence, cigarettes, uncomplicated: Secondary | ICD-10-CM | POA: Diagnosis not present

## 2022-11-21 DIAGNOSIS — Z5111 Encounter for antineoplastic chemotherapy: Secondary | ICD-10-CM | POA: Insufficient documentation

## 2022-11-21 DIAGNOSIS — C3411 Malignant neoplasm of upper lobe, right bronchus or lung: Secondary | ICD-10-CM | POA: Diagnosis not present

## 2022-11-21 DIAGNOSIS — C3491 Malignant neoplasm of unspecified part of right bronchus or lung: Secondary | ICD-10-CM

## 2022-11-21 DIAGNOSIS — C7931 Secondary malignant neoplasm of brain: Secondary | ICD-10-CM | POA: Insufficient documentation

## 2022-11-21 DIAGNOSIS — C7951 Secondary malignant neoplasm of bone: Secondary | ICD-10-CM | POA: Insufficient documentation

## 2022-11-21 DIAGNOSIS — Z923 Personal history of irradiation: Secondary | ICD-10-CM | POA: Insufficient documentation

## 2022-11-21 DIAGNOSIS — I10 Essential (primary) hypertension: Secondary | ICD-10-CM | POA: Diagnosis not present

## 2022-11-21 LAB — CMP (CANCER CENTER ONLY)
ALT: 20 U/L (ref 0–44)
AST: 18 U/L (ref 15–41)
Albumin: 3.7 g/dL (ref 3.5–5.0)
Alkaline Phosphatase: 66 U/L (ref 38–126)
Anion gap: 6 (ref 5–15)
BUN: 10 mg/dL (ref 8–23)
CO2: 29 mmol/L (ref 22–32)
Calcium: 9.4 mg/dL (ref 8.9–10.3)
Chloride: 106 mmol/L (ref 98–111)
Creatinine: 0.9 mg/dL (ref 0.61–1.24)
GFR, Estimated: >60 mL/min (ref >60)
Glucose, Bld: 107 mg/dL — ABNORMAL HIGH (ref 70–99)
Potassium: 4.4 mmol/L (ref 3.5–5.1)
Sodium: 141 mmol/L (ref 135–145)
Total Bilirubin: 0.2 mg/dL — ABNORMAL LOW (ref 0.3–1.2)
Total Protein: 6.5 g/dL (ref 6.5–8.1)

## 2022-11-21 LAB — CBC WITH DIFFERENTIAL (CANCER CENTER ONLY)
Abs Immature Granulocytes: 0.11 10*3/uL — ABNORMAL HIGH (ref 0.00–0.07)
Basophils Absolute: 0.1 10*3/uL (ref 0.0–0.1)
Basophils Relative: 1 %
Eosinophils Absolute: 0.2 10*3/uL (ref 0.0–0.5)
Eosinophils Relative: 1 %
HCT: 41 % (ref 39.0–52.0)
Hemoglobin: 12.4 g/dL — ABNORMAL LOW (ref 13.0–17.0)
Immature Granulocytes: 1 %
Lymphocytes Relative: 12 %
Lymphs Abs: 1.7 10*3/uL (ref 0.7–4.0)
MCH: 26.8 pg (ref 26.0–34.0)
MCHC: 30.2 g/dL (ref 30.0–36.0)
MCV: 88.6 fL (ref 80.0–100.0)
Monocytes Absolute: 0.7 10*3/uL (ref 0.1–1.0)
Monocytes Relative: 5 %
Neutro Abs: 11.4 10*3/uL — ABNORMAL HIGH (ref 1.7–7.7)
Neutrophils Relative %: 80 %
Platelet Count: 438 10*3/uL — ABNORMAL HIGH (ref 150–400)
RBC: 4.63 MIL/uL (ref 4.22–5.81)
RDW: 18.2 % — ABNORMAL HIGH (ref 11.5–15.5)
WBC Count: 14.2 10*3/uL — ABNORMAL HIGH (ref 4.0–10.5)
nRBC: 0 % (ref 0.0–0.2)

## 2022-11-21 MED ORDER — PROCHLORPERAZINE MALEATE 10 MG PO TABS
10.0000 mg | ORAL_TABLET | Freq: Once | ORAL | Status: AC
  Administered 2022-11-21: 10 mg via ORAL
  Filled 2022-11-21: qty 1

## 2022-11-21 MED ORDER — SODIUM CHLORIDE 0.9 % IV SOLN
500.0000 mg/m2 | Freq: Once | INTRAVENOUS | Status: AC
  Administered 2022-11-21: 1000 mg via INTRAVENOUS
  Filled 2022-11-21: qty 40

## 2022-11-21 MED ORDER — SODIUM CHLORIDE 0.9 % IV SOLN
200.0000 mg | Freq: Once | INTRAVENOUS | Status: AC
  Administered 2022-11-21: 200 mg via INTRAVENOUS
  Filled 2022-11-21: qty 8

## 2022-11-21 MED ORDER — SODIUM CHLORIDE 0.9 % IV SOLN: Freq: Once | INTRAVENOUS | Status: AC

## 2022-11-21 MED ORDER — CYANOCOBALAMIN 1000 MCG/ML IJ SOLN
1000.0000 ug | Freq: Once | INTRAMUSCULAR | Status: AC
  Administered 2022-11-21: 1000 ug via INTRAMUSCULAR
  Filled 2022-11-21: qty 1

## 2022-11-21 NOTE — Patient Instructions (Signed)
Steps to Quit Smoking Smoking tobacco is the leading cause of preventable death. It can affect almost every organ in the body. Smoking puts you and people around you at risk for many serious, long-lasting (chronic) diseases. Quitting smoking can be hard, but it is one of the best things that you can do for your health. It is never too late to quit. Do not give up if you cannot quit the first time. Some people need to try many times to quit. Do your best to stick to your quit plan, and talk with your doctor if you have any questions or concerns. How do I get ready to quit? Pick a date to quit. Set a date within the next 2 weeks to give you time to prepare. Write down the reasons why you are quitting. Keep this list in places where you will see it often. Tell your family, friends, and co-workers that you are quitting. Their support is important. Talk with your doctor about the choices that may help you quit. Find out if your health insurance will pay for these treatments. Know the people, places, things, and activities that make you want to smoke (triggers). Avoid them. What first steps can I take to quit smoking? Throw away all cigarettes at home, at work, and in your car. Throw away the things that you use when you smoke, such as ashtrays and lighters. Clean your car. Empty the ashtray. Clean your home, including curtains and carpets. What can I do to help me quit smoking? Talk with your doctor about taking medicines and seeing a counselor. You are more likely to succeed when you do both. If you are pregnant or breastfeeding: Talk with your doctor about counseling or other ways to quit smoking. Do not take medicine to help you quit smoking unless your doctor tells you to. Quit right away Quit smoking completely, instead of slowly cutting back on how much you smoke over a period of time. Stopping smoking right away may be more successful than slowly quitting. Go to counseling. In-person is best  if this is an option. You are more likely to quit if you go to counseling sessions regularly. Take medicine You may take medicines to help you quit. Some medicines need a prescription, and some you can buy over-the-counter. Some medicines may contain a drug called nicotine to replace the nicotine in cigarettes. Medicines may: Help you stop having the desire to smoke (cravings). Help to stop the problems that come when you stop smoking (withdrawal symptoms). Your doctor may ask you to use: Nicotine patches, gum, or lozenges. Nicotine inhalers or sprays. Non-nicotine medicine that you take by mouth. Find resources Find resources and other ways to help you quit smoking and remain smoke-free after you quit. They include: Online chats with a counselor. Phone quitlines. Printed self-help materials. Support groups or group counseling. Text messaging programs. Mobile phone apps. Use apps on your mobile phone or tablet that can help you stick to your quit plan. Examples of free services include Quit Guide from the CDC and smokefree.gov  What can I do to make it easier to quit?  Talk to your family and friends. Ask them to support and encourage you. Call a phone quitline, such as 1-800-QUIT-NOW, reach out to support groups, or work with a counselor. Ask people who smoke to not smoke around you. Avoid places that make you want to smoke, such as: Bars. Parties. Smoke-break areas at work. Spend time with people who do not smoke. Lower   the stress in your life. Stress can make you want to smoke. Try these things to lower stress: Getting regular exercise. Doing deep-breathing exercises. Doing yoga. Meditating. What benefits will I see if I quit smoking? Over time, you may have: A better sense of smell and taste. Less coughing and sore throat. A slower heart rate. Lower blood pressure. Clearer skin. Better breathing. Fewer sick days. Summary Quitting smoking can be hard, but it is one of  the best things that you can do for your health. Do not give up if you cannot quit the first time. Some people need to try many times to quit. When you decide to quit smoking, make a plan to help you succeed. Quit smoking right away, not slowly over a period of time. When you start quitting, get help and support to keep you smoke-free. This information is not intended to replace advice given to you by your health care provider. Make sure you discuss any questions you have with your health care provider. Document Revised: 09/15/2021 Document Reviewed: 09/15/2021 Elsevier Patient Education  2023 [employer].  

## 2022-11-21 NOTE — Progress Notes (Signed)
Per Dr. Julien Nordmann , it is ok to treat Bradley Goodman today with Keytruda and Alimta and pulse of 107 and  temperature of 100.1 f.

## 2022-11-21 NOTE — Patient Instructions (Signed)
[facility]  Discharge Instructions: Thank you for choosing [facility] to provide your oncology and hematology care.   If you have a lab appointment with the [facility], please go directly to the [facility] and check in at the registration area.   Wear comfortable clothing and clothing appropriate for easy access to any Portacath or PICC line.   We strive to give you quality time with your provider. You may need to reschedule your appointment if you arrive late (15 or more minutes).  Arriving late affects you and other patients whose appointments are after yours.  Also, if you miss three or more appointments without notifying the office, you may be dismissed from the clinic at the provider's discretion.      For prescription refill requests, have your pharmacy contact our office and allow 72 hours for refills to be completed.    Today you received the following chemotherapy and/or immunotherapy agents Keytruda, Alimta.      To help prevent nausea and vomiting after your treatment, we encourage you to take your nausea medication as directed.  BELOW ARE SYMPTOMS THAT SHOULD BE REPORTED IMMEDIATELY: *FEVER GREATER THAN 100.4 F (38 C) OR HIGHER *CHILLS OR SWEATING *NAUSEA AND VOMITING THAT IS NOT CONTROLLED WITH YOUR NAUSEA MEDICATION *UNUSUAL SHORTNESS OF BREATH *UNUSUAL BRUISING OR BLEEDING *URINARY PROBLEMS (pain or burning when urinating, or frequent urination) *BOWEL PROBLEMS (unusual diarrhea, constipation, pain near the anus) TENDERNESS IN MOUTH AND THROAT WITH OR WITHOUT PRESENCE OF ULCERS (sore throat, sores in mouth, or a toothache) UNUSUAL RASH, SWELLING OR PAIN  UNUSUAL VAGINAL DISCHARGE OR ITCHING   Items with * indicate a potential emergency and should be followed up as soon as possible or go to the Emergency Department if any problems should occur.  Please show the CHEMOTHERAPY ALERT CARD or IMMUNOTHERAPY ALERT CARD  at check-in to the Emergency Department and triage nurse.  Should you have questions after your visit or need to cancel or reschedule your appointment, please contact [facility]  Dept: [phone]  and follow the prompts.  Office hours are 8:00 a.m. to 4:30 p.m. Monday - Friday. Please note that voicemails left after 4:00 p.m. may not be returned until the following business day.  We are closed weekends and major holidays. You have access to a nurse at all times for urgent questions. Please call the main number to the clinic Dept: [phone] and follow the prompts.   For any non-urgent questions, you may also contact your provider using MyChart. We now offer e-Visits for anyone 19 and older to request care online for non-urgent symptoms. For details visit [email or web address].   Also download the MyChart app! Go to the app store, search "MyChart", open the app, select [facility], and log in with your MyChart username and password.

## 2022-11-21 NOTE — Progress Notes (Signed)
[facility] [phone]   [phone]  OFFICE PROGRESS NOTE  [name], [place] [address]  DIAGNOSIS: Stage IV (T1b, N3, M1C) non-small cell lung cancer, favoring adenocarcinoma presented with right upper lobe lung nodule in addition to right hilar, subcarinal and bilateral mediastinal as well as supraclavicular lymphadenopathy in addition to bone and brain metastasis diagnosed in June 2022.     PD-L1 expression 80%.     Molecular Studies:  Biomarker Findings Microsatellite status - MS-Stable Tumor Mutational Burden - 6 Muts/Mb Genomic Findings For a complete list of the genes assayed, please refer to the Appendix. KRAS G12C, amplification ATM S470* CCND1 amplification - equivocal? HGF amplification - equivocal? MYC amplification - equivocal? FGF19 amplification - equivocal? FGF3 amplification - equivocal? FGF4 amplification - equivocal? NFKBIA amplification NKX2-1 amplification RAD21 amplification - equivocal? [id number]*26 TERT promoter -124C>T TP53 rearrangement exon 9 7 Disease relevant genes with no reportable alterations: ALK, BRAF, EGFR, ERBB2, MET, RET, ROS1   PRIOR THERAPY: SRS to the metastatic brain lesions under the care of Dr. Tammi Klippel.  Last treatment on 05/04/2021.   CURRENT THERAPY: Palliative systemic chemotherapy with carboplatin for an AUC 5, Alimta 500 mg/m2 and, Keytruda 200 mg IV every 3 weeks.  First dose expected on 05/08/2021.  Status post 26 cycles.  Starting from cycle #5 he is on maintenance treatment with Alimta and Keytruda every 3 weeks.  INTERVAL HISTORY: Bradley Goodman 67 y.o. male returns to the clinic today for follow-up visit.  The patient is feeling fine today with no concerning complaints.  He was treated for cellulitis of the left lower extremity and this significantly improved.  He denied having any current chest pain, shortness of breath but has mild cough with  no hemoptysis.  The patient has no nausea, vomiting, diarrhea or constipation.  He has no headache or visual changes.  He was seen at [facility] 2 weeks ago for shortness of breath and CT scan of the chest showed no concerning findings for pneumonia or disease progression.  The patient is here today for evaluation before resuming his treatment.  MEDICAL HISTORY: Past Medical History:  Diagnosis Date   Cervical spondylolysis    Essential hypertension    GERD (gastroesophageal reflux disease)    History of kidney stones    History of migraine    Hyperlipidemia    Hypertension    PONV (postoperative nausea and vomiting)    Type 2 diabetes mellitus (HCC)     ALLERGIES:  has No Known Allergies.  MEDICATIONS:  Current Outpatient Medications  Medication Sig Dispense Refill   cholecalciferol (VITAMIN D) 1000 UNITS tablet Take 1,000 Units by mouth every evening.     Continuous Blood Gluc Sensor (FREESTYLE LIBRE 3 SENSOR) MISC Place 1 sensor on the skin every 14 days. Use to check glucose continuously 2 each 2   dexamethasone (DECADRON) 1 MG tablet Take 1 tablet (1 mg total) by mouth daily with breakfast. 90 tablet 1   docusate sodium (COLACE) 100 MG capsule Take 100 mg by mouth daily. (Patient not taking: Reported on 1/95/0932)     folic acid (FOLVITE) 1 MG tablet Take 1 tablet by mouth daily. 90 tablet 4   Krill Oil 300 MG CAPS Take 300 mg by mouth every evening.     levothyroxine (SYNTHROID) 75 MCG tablet Take 1 tablet (75 mcg total) by mouth daily before breakfast. 90 tablet 1   loratadine (CLARITIN)  10 MG tablet Take 10 mg by mouth every evening.      losartan (COZAAR) 50 MG tablet Take 1 and 1/2 tablets (75 mg total) by mouth daily. 135 tablet 1   mirtazapine (REMERON) 15 MG tablet Take 1 tablet by mouth at bedtime. 90 tablet 1   Multiple Vitamins-Minerals (MULTIVITAMIN GUMMIES ADULT PO) Take 2 each by mouth daily.     pantoprazole (PROTONIX) 40 MG tablet Take 1 tablet by mouth  every evening. 90 tablet 1   PRESCRIPTION MEDICATION Testosterone injection     prochlorperazine (COMPAZINE) 10 MG tablet Take 1 tablet (10 mg total) by mouth every 6 (six) hours as needed for nausea or vomiting. 30 tablet 0   rosuvastatin (CRESTOR) 20 MG tablet Take 1 tablet (20 mg total) by mouth daily. 90 tablet 1   Semaglutide,0.25 or 0.5MG /DOS, (OZEMPIC, 0.25 OR 0.5 MG/DOSE,) 2 MG/3ML SOPN Inject 0.5 mg into the skin once a week. 9 mL 1   SYRINGE-NEEDLE, DISP, 3 ML 21G X 1-1/2" 3 ML MISC Use to inject testosterone every week 100 each 2   testosterone cypionate (DEPOTESTOTERONE CYPIONATE) 100 MG/ML injection Take 50mg  ( 0.47ml) and 100mg  ( 80ml) into muscle alternatively weekly. 10 mL 0   venlafaxine XR (EFFEXOR-XR) 75 MG 24 hr capsule Take 1 capsule (75 mg total) by mouth daily with breakfast. 90 capsule 1   No current facility-administered medications for this visit.    SURGICAL HISTORY:  Past Surgical History:  Procedure Laterality Date   Bilateral inguinal hernia repair     BRONCHIAL NEEDLE ASPIRATION BIOPSY  04/05/2021   Procedure: BRONCHIAL NEEDLE ASPIRATION BIOPSIES;  Surgeon: Garner Nash, DO;  Location: [facility];  Service: Pulmonary;;   COLONOSCOPY  01/23/2012   Procedure: COLONOSCOPY;  Surgeon: Daneil Dolin, MD;  [facility];  Service: Endoscopy;  Laterality: N/A;  9:30 AM   COLONOSCOPY N/A 10/11/2015   Procedure: COLONOSCOPY;  Surgeon: Daneil Dolin, MD;  [facility];  Service: Endoscopy;  Laterality: N/A;  830   COLONOSCOPY N/A 10/14/2019   Procedure: COLONOSCOPY;  Surgeon: Daneil Dolin, MD;  [facility];  Service: Endoscopy;  Laterality: N/A;  1:45   POLYPECTOMY  10/14/2019   Procedure: POLYPECTOMY;  Surgeon: Daneil Dolin, MD;  [facility];  Service: Endoscopy;;  ascending colon, descending colon   VIDEO BRONCHOSCOPY WITH ENDOBRONCHIAL ULTRASOUND N/A 04/05/2021   Procedure: VIDEO BRONCHOSCOPY WITH ENDOBRONCHIAL  ULTRASOUND;  Surgeon: Garner Nash, DO;  Location: [facility];  Service: Pulmonary;  Laterality: N/A;    REVIEW OF SYSTEMS:  A comprehensive review of systems was negative except for: Constitutional: positive for fatigue Respiratory: positive for cough   PHYSICAL EXAMINATION: General appearance: alert, cooperative, appears stated age, and no distress Head: Normocephalic, without obvious abnormality, atraumatic Neck: no adenopathy, no JVD, supple, symmetrical, trachea midline, and thyroid not enlarged, symmetric, no tenderness/mass/nodules Lymph nodes: Cervical, supraclavicular, and axillary nodes normal. Resp: clear to auscultation bilaterally Back: symmetric, no curvature. ROM normal. No CVA tenderness. Cardio: regular rate and rhythm, S1, S2 normal, no murmur, click, rub or gallop GI: soft, non-tender; bowel sounds normal; no masses,  no organomegaly Extremities: extremities normal, atraumatic, no cyanosis or edema  ECOG PERFORMANCE STATUS: 1 - Symptomatic but completely ambulatory  Blood pressure (!) 141/89, pulse (!) 107, temperature 100.1 F (37.8 C), temperature source Oral, resp. rate 19, weight 191 lb 8 oz (86.9 kg), SpO2 97 %.  LABORATORY DATA: Lab Results  Component  Value Date   WBC 14.2 (H) 11/21/2022   HGB 12.4 (L) 11/21/2022   HCT 41.0 11/21/2022   MCV 88.6 11/21/2022   PLT 438 (H) 11/21/2022      Chemistry      Component Value Date/Time   NA 141 11/21/2022 0833   NA 144 01/10/2021 0849   K 4.4 11/21/2022 0833   CL 106 11/21/2022 0833   CO2 29 11/21/2022 0833   BUN 10 11/21/2022 0833   BUN 15 01/10/2021 0849   CREATININE 0.90 11/21/2022 0833      Component Value Date/Time   CALCIUM 9.4 11/21/2022 0833   ALKPHOS 66 11/21/2022 0833   AST 18 11/21/2022 0833   ALT 20 11/21/2022 0833   BILITOT 0.2 (L) 11/21/2022 0833       RADIOGRAPHIC STUDIES: CT Angio Chest PE W and/or Wo Contrast  Result Date: 11/08/2022 CLINICAL DATA:  Cough, shortness of  breath.  History of lung cancer. EXAM: CT ANGIOGRAPHY CHEST WITH CONTRAST TECHNIQUE: Multidetector CT imaging of the chest was performed using the standard protocol during bolus administration of intravenous contrast. Multiplanar CT image reconstructions and MIPs were obtained to evaluate the vascular anatomy. RADIATION DOSE REDUCTION: This exam was performed according to the departmental dose-optimization program which includes automated exposure control, adjustment of the mA and/or kV according to patient size and/or use of iterative reconstruction technique. CONTRAST:  178mL OMNIPAQUE IOHEXOL 350 MG/ML SOLN COMPARISON:  Chest radiograph dated 11/07/2022. CT chest dated 10/11/2022. FINDINGS: Cardiovascular: Satisfactory opacification of the bilateral pulmonary arteries to lobar level. Evaluation of the bilateral lower lobes is constrained by suboptimal bolus timing. Within that constraint, there is no evidence of pulmonary embolism. Although not tailored for evaluation of the thoracic aorta, there is no evidence of thoracic aortic aneurysm or dissection. Atherosclerotic calcifications of the aortic arch. Heart is normal in size.  Small pericardial effusion. Mediastinum/Nodes: No suspicious mediastinal lymphadenopathy. Visualized thyroid is unremarkable. Lungs/Pleura: Biapical pleural-parenchymal scarring. Scattered bilateral pulmonary nodules, including a stable 7 mm nodule in the anterior right upper lobe (series 6/image 39. Mild paraseptal emphysematous changes, upper lung predominant. No focal consolidation. No pleural effusion or pneumothorax. Upper Abdomen: Visualized upper abdomen is grossly unremarkable. Musculoskeletal: Visualized osseous structures are within normal limits. Review of the MIP images confirms the above findings. IMPRESSION: No evidence of pulmonary embolism. Stable bilateral pulmonary nodules measuring up to 7 mm. No findings suspicious for recurrent or metastatic disease. Aortic  Atherosclerosis (ICD10-I70.0) and Emphysema (ICD10-J43.9). Electronically Signed   By: Julian Hy M.D.   On: 11/08/2022 01:31   DG Chest 2 View  Result Date: 11/07/2022 CLINICAL DATA:  Chest pain. Cough, shortness of breath, nasal congestion. EXAM: CHEST - 2 VIEW COMPARISON:  Chest CT 10/11/2022 FINDINGS: The heart is normal in size. Stable mediastinal contours. Hyperinflation with mild bronchial thickening. There is biapical pleuroparenchymal scarring. Pulmonary nodules on CT are not seen by radiograph. No confluent airspace disease. Blunting of the costophrenic angles is likely due to hyperinflation rather than pleural effusions. No pneumothorax. No pulmonary edema. IMPRESSION: Chronic hyperinflation with mild bronchial thickening. No acute findings. Electronically Signed   By: Keith Rake M.D.   On: 11/07/2022 23:41    ASSESSMENT AND PLAN: This is a very pleasant 67 years old white male recently diagnosed with stage IV (T1b, N3, M1 C) non-small cell lung cancer favoring adenocarcinoma presented with right upper lobe lung nodule in addition to right hilar, subcarinal and bilateral mediastinal as well as supraclavicular lymphadenopathy.  The patient  also has bone and brain metastasis diagnosed in June 2022.  His PD-L1 expression is 80% and his molecular studies showed KRAS G12C mutation. The patient underwent SRS to metastatic brain lesion under the care of Dr. Tammi Klippel and he is currently undergoing systemic chemotherapy with carboplatin for AUC of 5, Alimta 500 Mg/M2 and Keytruda 200 Mg IV every 3 weeks status post 26 cycles.  Starting from cycle #5 the patient will be treated with maintenance treatment with Alimta and Keytruda every 3 weeks. He has been tolerating this treatment well with no concerning adverse effects. I recommended for him to proceed with cycle #27 today as planned. I will see him back for follow-up visit in 3 weeks for evaluation before the next cycle of his  treatment. The patient was advised to call immediately if he has any other concerning symptoms in the interval. The patient voices understanding of current disease status and treatment options and is in agreement with the current care plan.  All questions were answered. The patient knows to call the clinic with any problems, questions or concerns. We can certainly see the patient much sooner if necessary. The total time spent in the appointment was 20 minutes.  Disclaimer: This note was dictated with voice recognition software. Similar sounding words can inadvertently be transcribed and may not be corrected upon review.

## 2022-11-26 ENCOUNTER — Ambulatory Visit: Payer: 59

## 2022-11-26 ENCOUNTER — Other Ambulatory Visit (HOSPITAL_COMMUNITY): Payer: Self-pay

## 2022-11-26 ENCOUNTER — Ambulatory Visit: Payer: 59 | Admitting: Internal Medicine

## 2022-11-26 ENCOUNTER — Other Ambulatory Visit: Payer: 59

## 2022-12-03 ENCOUNTER — Other Ambulatory Visit: Payer: Self-pay

## 2022-12-03 ENCOUNTER — Ambulatory Visit: Payer: Self-pay | Admitting: Physician Assistant

## 2022-12-03 ENCOUNTER — Ambulatory Visit: Payer: Self-pay

## 2022-12-04 ENCOUNTER — Other Ambulatory Visit: Payer: Self-pay

## 2022-12-08 NOTE — Progress Notes (Unsigned)
[facility] OFFICE PROGRESS NOTE  [name], [place] [address]  DIAGNOSIS: Stage IV (T1b, N3, M1C) non-small cell lung cancer, favoring adenocarcinoma presented with right upper lobe lung nodule in addition to right hilar, subcarinal and bilateral mediastinal as well as supraclavicular lymphadenopathy in addition to bone and brain metastasis diagnosed in June 2022.   PD-L1 expression 80%.     Molecular Studies:  Biomarker Findings Microsatellite status - MS-Stable Tumor Mutational Burden - 6 Muts/Mb Genomic Findings For a complete list of the genes assayed, please refer to the Appendix. KRAS G12C, amplification ATM S470* CCND1 amplification - equivocal? HGF amplification - equivocal? MYC amplification - equivocal? FGF19 amplification - equivocal? FGF3 amplification - equivocal? FGF4 amplification - equivocal? NFKBIA amplification NKX2-1 amplification RAD21 amplification - equivocal? [id number] TERT promoter -124C>T TP53 rearrangement exon 9 7 Disease relevant genes with no reportable alterations: ALK, BRAF, EGFR, ERBB2, MET, RET, ROS1  PRIOR THERAPY:  SRS to the metastatic brain lesions under the care of Dr. MTammi Klippel  Last treatment on 05/04/2021.   CURRENT THERAPY:  Palliative systemic chemotherapy with carboplatin for an AUC 5, Alimta 500 mg/m2 and, Keytruda 200 mg IV every 3 weeks.  First dose expected on 05/08/2021.  Status post 27 cycles.  Starting from cycle #5 he is on maintenance treatment with Alimta and Keytruda every 3 weeks.     INTERVAL HISTORY: Bradley Goodman 67y.o. male returns to the clinic today for a follow-up visit accompanied by his wife.  The patient was last seen by Dr. MJulien Nordmannon ***.  At this time the patient is feeling better.  Diarrhea nausea and vomiting has resolved. Today, he denies any fever, chills, night sweats, or unexplained weight loss.  Denies any dyspnea, cough, chest pain, or hemoptysis.   Denies any nausea, vomiting, or constipation. The patient reports a long history of migraines. He denies unusual headaches. He denies any vision changes. He had a routine brain MRI last month. He is here today for evaluation and repeat blood work before undergoing cycle #28    MEDICAL HISTORY: Past Medical History:  Diagnosis Date   Cervical spondylolysis    Essential hypertension    GERD (gastroesophageal reflux disease)    History of kidney stones    History of migraine    Hyperlipidemia    Hypertension    PONV (postoperative nausea and vomiting)    Type 2 diabetes mellitus (HCC)     ALLERGIES:  has No Known Allergies.  MEDICATIONS:  Current Outpatient Medications  Medication Sig Dispense Refill   cholecalciferol (VITAMIN D) 1000 UNITS tablet Take 1,000 Units by mouth every evening.     Continuous Blood Gluc Sensor (FREESTYLE LIBRE 3 SENSOR) MISC Place 1 sensor on the skin every 14 days. Use to check glucose continuously 2 each 2   dexamethasone (DECADRON) 1 MG tablet Take 1 tablet (1 mg total) by mouth daily with breakfast. 90 tablet 1   docusate sodium (COLACE) 100 MG capsule Take 100 mg by mouth daily. (Patient not taking: Reported on [id number]     folic acid (FOLVITE) 1 MG tablet Take 1 tablet by mouth daily. 90 tablet 4   Krill Oil 300 MG CAPS Take 300 mg by mouth every evening.     levothyroxine (SYNTHROID) 75 MCG tablet Take 1 tablet (75 mcg total) by mouth daily before breakfast. 90 tablet 1   loratadine (CLARITIN) 10 MG tablet Take 10 mg by mouth every evening.  losartan (COZAAR) 50 MG tablet Take 1 and 1/2 tablets (75 mg total) by mouth daily. 135 tablet 1   mirtazapine (REMERON) 15 MG tablet Take 1 tablet by mouth at bedtime. 90 tablet 1   Multiple Vitamins-Minerals (MULTIVITAMIN GUMMIES ADULT PO) Take 2 each by mouth daily.     pantoprazole (PROTONIX) 40 MG tablet Take 1 tablet by mouth every evening. 90 tablet 1   PRESCRIPTION MEDICATION Testosterone injection      prochlorperazine (COMPAZINE) 10 MG tablet Take 1 tablet (10 mg total) by mouth every 6 (six) hours as needed for nausea or vomiting. 30 tablet 0   rosuvastatin (CRESTOR) 20 MG tablet Take 1 tablet (20 mg total) by mouth daily. 90 tablet 1   Semaglutide,0.25 or 0.'5MG'$ /DOS, (OZEMPIC, 0.25 OR 0.5 MG/DOSE,) 2 MG/3ML SOPN Inject 0.5 mg into the skin once a week. 9 mL 1   SYRINGE-NEEDLE, DISP, 3 ML 21G X 1-1/2" 3 ML MISC Use to inject testosterone every week 100 each 2   testosterone cypionate (DEPOTESTOTERONE CYPIONATE) 100 MG/ML injection Take '50mg'$  ( 0.38m) and '100mg'$  ( 168m into muscle alternatively weekly. 10 mL 0   venlafaxine XR (EFFEXOR-XR) 75 MG 24 hr capsule Take 1 capsule (75 mg total) by mouth daily with breakfast. 90 capsule 1   No current facility-administered medications for this visit.    SURGICAL HISTORY:  Past Surgical History:  Procedure Laterality Date   Bilateral inguinal hernia repair     BRONCHIAL NEEDLE ASPIRATION BIOPSY  04/05/2021   Procedure: BRONCHIAL NEEDLE ASPIRATION BIOPSIES;  Surgeon: IcGarner NashDO;  [facility] [facility] Service: Pulmonary;;   COLONOSCOPY  01/23/2012   Procedure: COLONOSCOPY;  Surgeon: RoDaneil DolinMD;  [facility];  Service: Endoscopy;  Laterality: N/A;  9:30 AM   COLONOSCOPY N/A 10/11/2015   Procedure: COLONOSCOPY;  Surgeon: RoDaneil DolinMD;  [facility];  Service: Endoscopy;  Laterality: N/A;  830   COLONOSCOPY N/A 10/14/2019   Procedure: COLONOSCOPY;  Surgeon: RoDaneil DolinMD;  [facility];  Service: Endoscopy;  Laterality: N/A;  1:45   POLYPECTOMY  10/14/2019   Procedure: POLYPECTOMY;  Surgeon: RoDaneil DolinMD;  [facility];  Service: Endoscopy;;  ascending colon, descending colon   VIDEO BRONCHOSCOPY WITH ENDOBRONCHIAL ULTRASOUND N/A 04/05/2021   Procedure: VIDEO BRONCHOSCOPY WITH ENDOBRONCHIAL ULTRASOUND;  Surgeon: IcGarner NashDO;  [place] [facility] Service:  Pulmonary;  Laterality: N/A;    REVIEW OF SYSTEMS:   Review of Systems  Constitutional: Negative for appetite change, chills, fatigue, fever and unexpected weight change.  HENT:   Negative for mouth sores, nosebleeds, sore throat and trouble swallowing.   Eyes: Negative for eye problems and icterus.  Respiratory: Negative for cough, hemoptysis, shortness of breath and wheezing.   Cardiovascular: Negative for chest pain and leg swelling.  Gastrointestinal: Negative for abdominal pain, constipation, diarrhea, nausea and vomiting.  Genitourinary: Negative for bladder incontinence, difficulty urinating, dysuria, frequency and hematuria.   Musculoskeletal: Negative for back pain, gait problem, neck pain and neck stiffness.  Skin: Negative for itching and rash.  Neurological: Negative for dizziness, extremity weakness, gait problem, headaches, light-headedness and seizures.  Hematological: Negative for adenopathy. Does not bruise/bleed easily.  Psychiatric/Behavioral: Negative for confusion, depression and sleep disturbance. The patient is not nervous/anxious.     PHYSICAL EXAMINATION:  There were no vitals taken for this visit.  ECOG PERFORMANCE STATUS: {CHL ONC ECOG [id number] Exam  Constitutional:  Oriented to person, place, and time and well-developed, well-nourished, and in no distress. No distress.  HENT:  Head: Normocephalic and atraumatic.  Mouth/Throat: Oropharynx is clear and moist. No oropharyngeal exudate.  Eyes: Conjunctivae are normal. Right eye exhibits no discharge. Left eye exhibits no discharge. No scleral icterus.  Neck: Normal range of motion. Neck supple.  Cardiovascular: Normal rate, regular rhythm, normal heart sounds and intact distal pulses.   Pulmonary/Chest: Effort normal and breath sounds normal. No respiratory distress. No wheezes. No rales.  Abdominal: Soft. Bowel sounds are normal. Exhibits no distension and no mass. There is no tenderness.   Musculoskeletal: Normal range of motion. Exhibits no edema.  Lymphadenopathy:    No cervical adenopathy.  Neurological: Alert and oriented to person, place, and time. Exhibits normal muscle tone. Gait normal. Coordination normal.  Skin: Skin is warm and dry. No rash noted. Not diaphoretic. No erythema. No pallor.  Psychiatric: Mood, memory and judgment normal.  Vitals reviewed.  LABORATORY DATA: Lab Results  Component Value Date   WBC 14.2 (H) 11/21/2022   HGB 12.4 (L) 11/21/2022   HCT 41.0 11/21/2022   MCV 88.6 11/21/2022   PLT 438 (H) 11/21/2022      Chemistry      Component Value Date/Time   NA 141 11/21/2022 0833   NA 144 01/10/2021 0849   K 4.4 11/21/2022 0833   CL 106 11/21/2022 0833   CO2 29 11/21/2022 0833   BUN 10 11/21/2022 0833   BUN 15 01/10/2021 0849   CREATININE 0.90 11/21/2022 0833      Component Value Date/Time   CALCIUM 9.4 11/21/2022 0833   ALKPHOS 66 11/21/2022 0833   AST 18 11/21/2022 0833   ALT 20 11/21/2022 0833   BILITOT 0.2 (L) 11/21/2022 [id number]       RADIOGRAPHIC STUDIES:  No results found.   ASSESSMENT/PLAN:  This is a very pleasant 67 year old Caucasian male diagnosed with stage IV (T1b, N3, M1 C) non-small cell lung cancer, favoring adenocarcinoma.  The patient presented with a right upper lobe lung nodule in addition to right hilar, subcarinal, and bilateral mediastinal lymphadenopathy as well as supraclavicular lymphadenopathy.  The patient also has metastatic disease to the brain.  He was diagnosed in June 2022.  His PD-L1 expression is 80%.  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   The patient completed SRS to the brain lesions under the care of Dr. Tammi Klippel.  His last treatment was on 05/04/2021. 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    The patient is currently undergoing systemic  chemotherapy with carboplatin for an AUC of 5, Alimta 500 mg per metered squared, Keytruda 200 mg IV every 3 weeks.  Status post 27 cycles. Starting from cycle #5, the patient started maintenance Alimta and Keytruda IV every 3 weeks.   The patient was seen with Dr. Julien Nordmann. Labs were reviewed. Recommend that he *** with cycle #28 today as scheduled.   We will see him back for a follow up in 3 weeks for evaluation and repeat blood work before starting cycle #29.   The patient was advised to call immediately if she has any concerning symptoms in the interval. The patient voices understanding of current disease status and treatment options and is in agreement with the current care plan. All questions were answered. The patient knows to call the clinic with any  problems, questions or concerns. We can certainly see the patient much sooner if necessary    No orders of the defined types were placed in this encounter.    I spent {CHL ONC TIME VISIT - [id number] counseling the patient face to face. The total time spent in the appointment was {CHL ONC TIME VISIT - [id number].  Wladyslaw Henrichs L Wylee Dorantes, PA-C 12/08/22

## 2022-12-10 ENCOUNTER — Other Ambulatory Visit (HOSPITAL_COMMUNITY)
Admission: RE | Admit: 2022-12-10 | Discharge: 2022-12-10 | Disposition: A | Discharge status: Home / Self Care | Payer: Commercial Managed Care - PPO | Source: Ambulatory Visit | Attending: "Endocrinology | Admitting: "Endocrinology

## 2022-12-10 DIAGNOSIS — E230 Hypopituitarism: Secondary | ICD-10-CM | POA: Insufficient documentation

## 2022-12-10 DIAGNOSIS — C3491 Malignant neoplasm of unspecified part of right bronchus or lung: Secondary | ICD-10-CM | POA: Insufficient documentation

## 2022-12-10 LAB — LIPID PANEL
Cholesterol: 95 mg/dL (ref 0–200)
HDL: 34 mg/dL — ABNORMAL LOW (ref >40)
LDL Cholesterol: 21 mg/dL (ref 0–99)
Total CHOL/HDL Ratio: 2.8 RATIO
Triglycerides: 199 mg/dL — ABNORMAL HIGH (ref <150)
VLDL: 40 mg/dL (ref 0–40)

## 2022-12-10 LAB — T4, FREE: Free T4: 0.81 ng/dL (ref 0.61–1.12)

## 2022-12-12 ENCOUNTER — Inpatient Hospital Stay (HOSPITAL_BASED_OUTPATIENT_CLINIC_OR_DEPARTMENT_OTHER): Payer: Commercial Managed Care - PPO | Admitting: Physician Assistant

## 2022-12-12 ENCOUNTER — Inpatient Hospital Stay: Payer: Commercial Managed Care - PPO

## 2022-12-12 ENCOUNTER — Inpatient Hospital Stay: Payer: Commercial Managed Care - PPO | Attending: Physician Assistant

## 2022-12-12 DIAGNOSIS — C3491 Malignant neoplasm of unspecified part of right bronchus or lung: Secondary | ICD-10-CM

## 2022-12-12 DIAGNOSIS — C7951 Secondary malignant neoplasm of bone: Secondary | ICD-10-CM | POA: Insufficient documentation

## 2022-12-12 DIAGNOSIS — E230 Hypopituitarism: Secondary | ICD-10-CM | POA: Diagnosis not present

## 2022-12-12 DIAGNOSIS — Z5112 Encounter for antineoplastic immunotherapy: Secondary | ICD-10-CM | POA: Diagnosis not present

## 2022-12-12 DIAGNOSIS — C3411 Malignant neoplasm of upper lobe, right bronchus or lung: Secondary | ICD-10-CM | POA: Insufficient documentation

## 2022-12-12 DIAGNOSIS — F17210 Nicotine dependence, cigarettes, uncomplicated: Secondary | ICD-10-CM | POA: Insufficient documentation

## 2022-12-12 DIAGNOSIS — Z79620 Long term (current) use of immunosuppressive biologic: Secondary | ICD-10-CM | POA: Diagnosis not present

## 2022-12-12 DIAGNOSIS — Z5111 Encounter for antineoplastic chemotherapy: Secondary | ICD-10-CM | POA: Insufficient documentation

## 2022-12-12 DIAGNOSIS — Z923 Personal history of irradiation: Secondary | ICD-10-CM | POA: Diagnosis not present

## 2022-12-12 DIAGNOSIS — C7931 Secondary malignant neoplasm of brain: Secondary | ICD-10-CM | POA: Insufficient documentation

## 2022-12-12 DIAGNOSIS — I10 Essential (primary) hypertension: Secondary | ICD-10-CM | POA: Insufficient documentation

## 2022-12-12 LAB — CMP (CANCER CENTER ONLY)
ALT: 22 U/L (ref 0–44)
AST: 19 U/L (ref 15–41)
Albumin: 3.9 g/dL (ref 3.5–5.0)
Alkaline Phosphatase: 60 U/L (ref 38–126)
Anion gap: 8 (ref 5–15)
BUN: 11 mg/dL (ref 8–23)
CO2: 27 mmol/L (ref 22–32)
Calcium: 8.9 mg/dL (ref 8.9–10.3)
Chloride: 106 mmol/L (ref 98–111)
Creatinine: 1 mg/dL (ref 0.61–1.24)
GFR, Estimated: >60 mL/min (ref >60)
Glucose, Bld: 97 mg/dL (ref 70–99)
Potassium: 3.8 mmol/L (ref 3.5–5.1)
Sodium: 141 mmol/L (ref 135–145)
Total Bilirubin: 0.2 mg/dL — ABNORMAL LOW (ref 0.3–1.2)
Total Protein: 6.7 g/dL (ref 6.5–8.1)

## 2022-12-12 LAB — CBC WITH DIFFERENTIAL (CANCER CENTER ONLY)
Abs Immature Granulocytes: 0.07 10*3/uL (ref 0.00–0.07)
Basophils Absolute: 0.1 10*3/uL (ref 0.0–0.1)
Basophils Relative: 1 %
Eosinophils Absolute: 0.1 10*3/uL (ref 0.0–0.5)
Eosinophils Relative: 1 %
HCT: 42 % (ref 39.0–52.0)
Hemoglobin: 12.9 g/dL — ABNORMAL LOW (ref 13.0–17.0)
Immature Granulocytes: 1 %
Lymphocytes Relative: 15 %
Lymphs Abs: 2.1 10*3/uL (ref 0.7–4.0)
MCH: 26.9 pg (ref 26.0–34.0)
MCHC: 30.7 g/dL (ref 30.0–36.0)
MCV: 87.5 fL (ref 80.0–100.0)
Monocytes Absolute: 0.7 10*3/uL (ref 0.1–1.0)
Monocytes Relative: 5 %
Neutro Abs: 10.7 10*3/uL — ABNORMAL HIGH (ref 1.7–7.7)
Neutrophils Relative %: 77 %
Platelet Count: 285 10*3/uL (ref 150–400)
RBC: 4.8 MIL/uL (ref 4.22–5.81)
RDW: 18.6 % — ABNORMAL HIGH (ref 11.5–15.5)
WBC Count: 13.8 10*3/uL — ABNORMAL HIGH (ref 4.0–10.5)
nRBC: 0 % (ref 0.0–0.2)

## 2022-12-12 LAB — TSH: TSH: 0.802 u[IU]/mL (ref 0.350–4.500)

## 2022-12-12 MED ORDER — PROCHLORPERAZINE MALEATE 10 MG PO TABS
10.0000 mg | ORAL_TABLET | Freq: Once | ORAL | Status: AC
  Administered 2022-12-12: 10 mg via ORAL
  Filled 2022-12-12: qty 1

## 2022-12-12 MED ORDER — SODIUM CHLORIDE 0.9 % IV SOLN: Freq: Once | INTRAVENOUS | Status: AC

## 2022-12-12 MED ORDER — SODIUM CHLORIDE 0.9 % IV SOLN
500.0000 mg/m2 | Freq: Once | INTRAVENOUS | Status: AC
  Administered 2022-12-12: 1000 mg via INTRAVENOUS
  Filled 2022-12-12: qty 40

## 2022-12-12 MED ORDER — SODIUM CHLORIDE 0.9 % IV SOLN
200.0000 mg | Freq: Once | INTRAVENOUS | Status: AC
  Administered 2022-12-12: 200 mg via INTRAVENOUS
  Filled 2022-12-12: qty 8

## 2022-12-12 NOTE — Patient Instructions (Signed)
[facility]  Discharge Instructions: Thank you for choosing [facility] to provide your oncology and hematology care.   If you have a lab appointment with the Cancer Center, please go directly to the Cancer Center and check in at the registration area.   Wear comfortable clothing and clothing appropriate for easy access to any Portacath or PICC line.   We strive to give you quality time with your provider. You may need to reschedule your appointment if you arrive late (15 or more minutes).  Arriving late affects you and other patients whose appointments are after yours.  Also, if you miss three or more appointments without notifying the office, you may be dismissed from the clinic at the provider's discretion.      For prescription refill requests, have your pharmacy contact our office and allow 72 hours for refills to be completed.    Today you received the following chemotherapy and/or immunotherapy agents: Keytruda, Alimta.       To help prevent nausea and vomiting after your treatment, we encourage you to take your nausea medication as directed.  BELOW ARE SYMPTOMS THAT SHOULD BE REPORTED IMMEDIATELY: *FEVER GREATER THAN 100.4 F (38 C) OR HIGHER *CHILLS OR SWEATING *NAUSEA AND VOMITING THAT IS NOT CONTROLLED WITH YOUR NAUSEA MEDICATION *UNUSUAL SHORTNESS OF BREATH *UNUSUAL BRUISING OR BLEEDING *URINARY PROBLEMS (pain or burning when urinating, or frequent urination) *BOWEL PROBLEMS (unusual diarrhea, constipation, pain near the anus) TENDERNESS IN MOUTH AND THROAT WITH OR WITHOUT PRESENCE OF ULCERS (sore throat, sores in mouth, or a toothache) UNUSUAL RASH, SWELLING OR PAIN  UNUSUAL VAGINAL DISCHARGE OR ITCHING   Items with * indicate a potential emergency and should be followed up as soon as possible or go to the Emergency Department if any problems should occur.  Please show the CHEMOTHERAPY ALERT CARD or IMMUNOTHERAPY ALERT CARD  at check-in to the Emergency Department and triage nurse.  Should you have questions after your visit or need to cancel or reschedule your appointment, please contact [facility]  Dept: [phone]  and follow the prompts.  Office hours are 8:00 a.m. to 4:30 p.m. Monday - Friday. Please note that voicemails left after 4:00 p.m. may not be returned until the following business day.  We are closed weekends and major holidays. You have access to a nurse at all times for urgent questions. Please call the main number to the clinic Dept: [phone] and follow the prompts.   For any non-urgent questions, you may also contact your provider using MyChart. We now offer e-Visits for anyone 18 and older to request care online for non-urgent symptoms. For details visit [email or web address].   Also download the MyChart app! Go to the app store, search "MyChart", open the app, select , and log in with your MyChart username and password.   

## 2022-12-12 NOTE — Progress Notes (Signed)
Per Cassie, PA ok to treat with HR of 109 today

## 2022-12-13 DIAGNOSIS — H02834 Dermatochalasis of left upper eyelid: Secondary | ICD-10-CM | POA: Diagnosis not present

## 2022-12-13 DIAGNOSIS — H02831 Dermatochalasis of right upper eyelid: Secondary | ICD-10-CM | POA: Diagnosis not present

## 2022-12-13 DIAGNOSIS — S0502XA Injury of conjunctiva and corneal abrasion without foreign body, left eye, initial encounter: Secondary | ICD-10-CM | POA: Diagnosis not present

## 2022-12-13 DIAGNOSIS — H1132 Conjunctival hemorrhage, left eye: Secondary | ICD-10-CM | POA: Diagnosis not present

## 2022-12-13 DIAGNOSIS — H2513 Age-related nuclear cataract, bilateral: Secondary | ICD-10-CM | POA: Diagnosis not present

## 2022-12-13 DIAGNOSIS — H524 Presbyopia: Secondary | ICD-10-CM | POA: Diagnosis not present

## 2022-12-13 DIAGNOSIS — E119 Type 2 diabetes mellitus without complications: Secondary | ICD-10-CM | POA: Diagnosis not present

## 2022-12-13 DIAGNOSIS — H52223 Regular astigmatism, bilateral: Secondary | ICD-10-CM | POA: Diagnosis not present

## 2022-12-13 LAB — T4: T4, Total: 7.4 ug/dL (ref 4.5–12.0)

## 2022-12-13 LAB — HM DIABETES EYE EXAM

## 2022-12-13 LAB — TESTOSTERONE,FREE AND TOTAL
Testosterone, Free: 6.5 pg/mL — ABNORMAL LOW (ref 6.6–18.1)
Testosterone: 487 ng/dL (ref 264–916)

## 2022-12-17 ENCOUNTER — Ambulatory Visit: Payer: 59

## 2022-12-17 ENCOUNTER — Ambulatory Visit: Payer: 59 | Admitting: Physician Assistant

## 2022-12-17 ENCOUNTER — Other Ambulatory Visit: Payer: 59

## 2022-12-18 ENCOUNTER — Ambulatory Visit: Payer: Commercial Managed Care - PPO | Admitting: "Endocrinology

## 2022-12-19 DIAGNOSIS — H52223 Regular astigmatism, bilateral: Secondary | ICD-10-CM | POA: Diagnosis not present

## 2022-12-19 DIAGNOSIS — H1132 Conjunctival hemorrhage, left eye: Secondary | ICD-10-CM | POA: Diagnosis not present

## 2022-12-19 DIAGNOSIS — H524 Presbyopia: Secondary | ICD-10-CM | POA: Diagnosis not present

## 2022-12-19 DIAGNOSIS — S0502XA Injury of conjunctiva and corneal abrasion without foreign body, left eye, initial encounter: Secondary | ICD-10-CM | POA: Diagnosis not present

## 2022-12-21 ENCOUNTER — Telehealth: Payer: Self-pay | Admitting: Internal Medicine

## 2022-12-21 NOTE — Telephone Encounter (Signed)
Called patient regarding upcoming March/May appointments, left a voicemail.

## 2022-12-24 ENCOUNTER — Other Ambulatory Visit: Payer: Commercial Managed Care - PPO

## 2022-12-24 ENCOUNTER — Ambulatory Visit: Payer: Commercial Managed Care - PPO

## 2022-12-24 ENCOUNTER — Encounter: Payer: Self-pay | Admitting: "Endocrinology

## 2022-12-24 ENCOUNTER — Other Ambulatory Visit (HOSPITAL_COMMUNITY): Payer: Self-pay

## 2022-12-24 ENCOUNTER — Ambulatory Visit: Payer: Commercial Managed Care - PPO | Admitting: "Endocrinology

## 2022-12-24 ENCOUNTER — Other Ambulatory Visit: Payer: Self-pay | Admitting: Family Medicine

## 2022-12-24 ENCOUNTER — Ambulatory Visit: Payer: Commercial Managed Care - PPO | Admitting: Internal Medicine

## 2022-12-24 VITALS — BP 118/84 | HR 92 | Ht 71.5 in | Wt 193.8 lb

## 2022-12-24 DIAGNOSIS — E230 Hypopituitarism: Secondary | ICD-10-CM

## 2022-12-24 DIAGNOSIS — E039 Hypothyroidism, unspecified: Secondary | ICD-10-CM

## 2022-12-24 DIAGNOSIS — E1165 Type 2 diabetes mellitus with hyperglycemia: Secondary | ICD-10-CM

## 2022-12-24 DIAGNOSIS — E2740 Unspecified adrenocortical insufficiency: Secondary | ICD-10-CM | POA: Diagnosis not present

## 2022-12-24 DIAGNOSIS — E291 Testicular hypofunction: Secondary | ICD-10-CM

## 2022-12-24 MED ORDER — TESTOSTERONE CYPIONATE 100 MG/ML IM SOLN
INTRAMUSCULAR | 0 refills | Status: DC
  Filled 2022-12-24: qty 10, 84d supply, fill #0

## 2022-12-24 NOTE — Progress Notes (Signed)
Endocrinology follow-up note                                             12/24/2022, 12:53 PM   Subjective:    Patient ID: Bradley Goodman, male    DOB: October 14, 1955, PCP [name], FNP   Past Medical History:  Diagnosis Date   Cervical spondylolysis    Essential hypertension    GERD (gastroesophageal reflux disease)    History of kidney stones    History of migraine    Hyperlipidemia    Hypertension    PONV (postoperative nausea and vomiting)    Type 2 diabetes mellitus ([place])    Past Surgical History:  Procedure Laterality Date   Bilateral inguinal hernia repair     BRONCHIAL NEEDLE ASPIRATION BIOPSY  04/05/2021   Procedure: BRONCHIAL NEEDLE ASPIRATION BIOPSIES;  Surgeon: Garner Nash, DO;  Location: [facility];  Service: Pulmonary;;   COLONOSCOPY  01/23/2012   Procedure: COLONOSCOPY;  Surgeon: Daneil Dolin, MD;  [facility];  Service: Endoscopy;  Laterality: N/A;  9:30 AM   COLONOSCOPY N/A 10/11/2015   Procedure: COLONOSCOPY;  Surgeon: Daneil Dolin, MD;  [facility];  Service: Endoscopy;  Laterality: N/A;  830   COLONOSCOPY N/A 10/14/2019   Procedure: COLONOSCOPY;  Surgeon: Daneil Dolin, MD;  [facility];  Service: Endoscopy;  Laterality: N/A;  1:45   POLYPECTOMY  10/14/2019   Procedure: POLYPECTOMY;  Surgeon: Daneil Dolin, MD;  [facility];  Service: Endoscopy;;  ascending colon, descending colon   VIDEO BRONCHOSCOPY WITH ENDOBRONCHIAL ULTRASOUND N/A 04/05/2021   Procedure: VIDEO BRONCHOSCOPY WITH ENDOBRONCHIAL ULTRASOUND;  Surgeon: Garner Nash, DO;  Location: [facility];  Service: Pulmonary;  Laterality: N/A;   Social History   Socioeconomic History   Marital status: Married    Spouse name: Not on file   Number of children: Not on file   Years of education: Not on file   Highest education level: Not on file  Occupational History   Not on file  Tobacco Use   Smoking status: Every Day     Packs/day: 0.50    Years: 30.00    Additional pack years: 0.00    Total pack years: 15.00    Types: Cigarettes    Start date: 10/30/1973   Smokeless tobacco: Never  Vaping Use   Vaping Use: Never used  Substance and Sexual Activity   Alcohol use: Not Currently    Comment: One drink every 6 months.   Drug use: No   Sexual activity: Yes  Other Topics Concern   Not on file  Social History Narrative   Not on file   Social Determinants of Health   Financial Resource Strain: Not on file  Food Insecurity: No Food Insecurity (12/12/2022)   Hunger Vital Sign    Worried About Running Out of Food in the Last Year: Never true    Ran Out of Food in the Last Year: Never true  Transportation Needs: No Transportation Needs (12/12/2022)   PRAPARE - Hydrologist (Medical): No    Lack of Transportation (Non-Medical): No  Physical Activity: Not on file  Stress: Not on file  Social Connections: Not on file   Family History  Problem Relation Age of Onset  Hypertension Mother    Diabetes Mother    Heart attack Mother    Hypertension Father    Heart attack Father    Heart attack Brother    Colon cancer Neg Hx    Outpatient Encounter Medications as of 12/24/2022  Medication Sig   cholecalciferol (VITAMIN D) 1000 UNITS tablet Take 1,000 Units by mouth every evening.   Continuous Blood Gluc Sensor (FREESTYLE LIBRE 3 SENSOR) MISC Place 1 sensor on the skin every 14 days. Use to check glucose continuously   dexamethasone (DECADRON) 1 MG tablet Take 1 tablet (1 mg total) by mouth daily with breakfast.   docusate sodium (COLACE) 100 MG capsule Take 100 mg by mouth daily.   folic acid (FOLVITE) 1 MG tablet Take 1 tablet by mouth daily.   Krill Oil 300 MG CAPS Take 300 mg by mouth every evening.   levothyroxine (SYNTHROID) 75 MCG tablet Take 1 tablet (75 mcg total) by mouth daily before breakfast.   loratadine (CLARITIN) 10 MG tablet Take 10 mg by mouth every evening.     losartan (COZAAR) 50 MG tablet Take 1 and 1/2 tablets (75 mg total) by mouth daily.   mirtazapine (REMERON) 15 MG tablet Take 1 tablet by mouth at bedtime.   Multiple Vitamins-Minerals (MULTIVITAMIN GUMMIES ADULT PO) Take 2 each by mouth daily.   pantoprazole (PROTONIX) 40 MG tablet Take 1 tablet by mouth every evening.   PRESCRIPTION MEDICATION Testosterone injection   prochlorperazine (COMPAZINE) 10 MG tablet Take 1 tablet (10 mg total) by mouth every 6 (six) hours as needed for nausea or vomiting.   rosuvastatin (CRESTOR) 20 MG tablet Take 1 tablet (20 mg total) by mouth daily.   Semaglutide,0.25 or 0.5MG /DOS, (OZEMPIC, 0.25 OR 0.5 MG/DOSE,) 2 MG/3ML SOPN Inject 0.5 mg into the skin once a week.   SYRINGE-NEEDLE, DISP, 3 ML 21G X 1-1/2" 3 ML MISC Use to inject testosterone every week   testosterone cypionate (DEPOTESTOTERONE CYPIONATE) 100 MG/ML injection Take 50mg  ( 0.106ml) and 100mg  ( 69ml) into muscle alternatively weekly.   venlafaxine XR (EFFEXOR-XR) 75 MG 24 hr capsule Take 1 capsule (75 mg total) by mouth daily with breakfast.   [DISCONTINUED] testosterone cypionate (DEPOTESTOTERONE CYPIONATE) 100 MG/ML injection Take 50mg  ( 0.66ml) and 100mg  ( 15ml) into muscle alternatively weekly.   No facility-administered encounter medications on file as of 12/24/2022.   ALLERGIES: No Known Allergies  VACCINATION STATUS: Immunization History  Administered Date(s) Administered   Fluad Quad(high Dose 65+) 07/28/2022   Hepatitis B 02/06/1990, 10/28/1990, 04/02/1991   Influenza,inj,Quad PF,6+ Mos 07/07/2019, 07/31/2021   Influenza-Unspecified 07/25/2018   Moderna Sars-Covid-2 Vaccination 12/01/2019, 12/30/2019, 10/19/2020   Pneumococcal Conjugate-13 03/11/2017   Tdap 01/17/2016    HPI Bradley Goodman is 67 y.o. male who presents today for follow-up after he was seen in consultation for multiple medical problems as follows.  He notes from previous visits. History is obtained directly from the  patient as well as chart review. He is accompanied by his wife.  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  He is status post radiosurgery of CNS metastasis and chemotherapy.  His chemotherapy in the involved high-dose steroids in the form of dexamethasone, currently on dexamethasone 2 mg p.o. daily.     During this process, he developed fatigue, low libido, and loss of skeletal muscle mass.  His labs showed hypogonadism, partial adrenal insufficiency, and hypothyroidism.  For this endocrine deficits, he was started on testosterone injection  50 mg 100 mg IM in alternate weeks.  His previsit labs show total testosterone of 487, improving from his last measurement.  He has no new complaints today.  He is also on Decadron 1 mg p.o. daily for glucocorticoid deficit.   He denies prior testicular injury.  He denies testicular radiation . His previsit thyroid function tests are consistent with appropriate replacement, currently on levothyroxine 75 mcg p.o. daily before breakfast.     He wishes to be continued on testosterone replacement therapy as well as his other hormones.  Continues to gain weight progressively.  He continues to smoke. He is back to enjoy his outdoor activities including golfing.    Review of Systems  Constitutional: + Stable weight,   + fatigue, + subjective hypothermia, + low libido  Objective:       12/24/2022   10:21 AM 12/12/2022   10:16 AM 11/21/2022    8:57 AM  Vitals with BMI  Height 5' 11.5"    Weight 193 lbs 13 oz 194 lbs 3 oz 191 lbs 8 oz  BMI [id number]  [id number]  Systolic 123456 123456 [id number]  Diastolic 84 95 89  Pulse 92 109 107    BP 118/84   Pulse 92   Ht 5' 11.5" (1.816 m)   Wt 193 lb 12.8 oz (87.9 kg)   BMI 26.65 kg/m   Wt Readings from Last 3 Encounters:  12/24/22 193 lb 12.8 oz (87.9 kg)  12/12/22 194 lb 3.2 oz (88.1 kg)  11/21/22 191 lb 8 oz (86.9 kg)    Physical Exam  Constitutional:  Body mass index is  26.65 kg/m.,  not in acute distress, normal state of mind Eyes: PERRLA, EOMI, no exophthalmos ENT: moist mucous membranes, no gross thyromegaly, no gross cervical lymphadenopathy   CMP ( most recent) CMP     Component Value Date/Time   NA 141 12/12/2022 0938   NA 144 01/10/2021 0849   K 3.8 12/12/2022 0938   CL 106 12/12/2022 0938   CO2 27 12/12/2022 0938   GLUCOSE 97 12/12/2022 0938   BUN 11 12/12/2022 0938   BUN 15 01/10/2021 0849   CREATININE 1.00 12/12/2022 0938   CALCIUM 8.9 12/12/2022 0938   PROT 6.7 12/12/2022 0938   PROT 6.3 01/10/2021 0849   ALBUMIN 3.9 12/12/2022 0938   ALBUMIN 4.2 01/10/2021 0849   AST 19 12/12/2022 0938   ALT 22 12/12/2022 0938   ALKPHOS 60 12/12/2022 0938   BILITOT 0.2 (L) 12/12/2022 0938   GFRNONAA >60 12/12/2022 0938   GFRAA 82 07/14/2020 0945     Diabetic Labs (most recent): Lab Results  Component Value Date   HGBA1C 6.5 (H) 10/16/2022   HGBA1C 6.6 (H) 07/31/2022   HGBA1C 6.6 (H) 04/17/2022   MICROALBUR neg 07/13/2014     Lipid Panel ( most recent) Lipid Panel     Component Value Date/Time   CHOL 95 12/10/2022 0805   CHOL 112 07/31/2022 0821   TRIG 199 (H) 12/10/2022 0805   TRIG 87 10/18/2016 0851   HDL 34 (L) 12/10/2022 0805   HDL 38 (L) 07/31/2022 0821   HDL 37 (L) 10/18/2016 0851   CHOLHDL 2.8 12/10/2022 0805   VLDL 40 12/10/2022 0805   LDLCALC 21 12/10/2022 0805   LDLCALC 40 07/31/2022 0821   LDLCALC 53 07/13/2014 0925   LABVLDL 34 07/31/2022 0821      Lab Results  Component Value Date   TSH 0.802 12/12/2022   TSH 0.780 10/16/2022  TSH 0.883 09/24/2022   TSH 1.053 09/07/2022   TSH 0.751 09/03/2022   TSH 0.705 08/13/2022   TSH 1.076 07/23/2022   TSH 1.331 07/02/2022   TSH 0.858 06/12/2022   TSH 1.289 06/04/2022   FREET4 0.81 12/10/2022   FREET4 0.60 (L) 09/07/2022   FREET4 0.76 06/04/2022   FREET4 0.58 (L) 01/22/2022   FREET4 0.75 09/22/2021      Latest Reference Range & Units 01/22/22 08:18 01/22/22  08:20  Sex Horm Binding Glob, Serum 19.3 - 76.4 nmol/L  16.7 (L)  Testosterone 264 - 916 ng/dL  234 (L)  Testosterone Free 6.6 - 18.1 pg/mL  4.7 (L)  Testosterone-% Free 0.2 - 0.7 %  1.9 (H)  TSH 0.350 - 4.500 uIU/mL 1.858   Triiodothyronine,Free,Serum 2.0 - 4.4 pg/mL 2.0   T4,Free(Direct) 0.61 - 1.12 ng/dL 0.58 (L)   (L): Data is abnormally low (H): Data is abnormally high  Assessment & Plan:   1. Hypopituitarism  2.  Hypogonadism 3.hypothyroidism 4.partial adrenal insufficiency -He is accompanied by his wife to clinic.  I discussed his recent findings and previous lab work with him.  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 -He is advised to continue Decadron 1 mg p.o. daily at breakfast.   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  His previsit thyroid function tests are consistent with under replacement.  I discussed and increase his levothyroxine to 75 mcg p.o. daily before breakfast.    - We discussed about the correct intake of his thyroid hormone, on empty stomach at fasting, with water, separated by at least 30 minutes from breakfast and other medications,  and separated by more than 4 hours from calcium, iron, multivitamins, acid reflux medications (PPIs). -Patient is made aware of the fact that thyroid hormone replacement is needed for life, dose to be adjusted by periodic monitoring of thyroid function tests.   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  He is advised to continue testosterone cypionate IM  50 mg alternating with  100 mg intramuscularly weekly until next measurement.  This will give him a total of 300 mg of testosterone monthly.    -Treatment target for him will be between 250-350 ng per DL, with the aim being to help him with libido as well as  possibly avoid further loss of skeletal muscle mass.  Regarding his type 2 diabetes, his recent A1c was 6.6%.   He is advised to continue Ozempic 0.5 mg subcutaneously weekly.  He was taken off of metformin recently.   The patient was counseled on the dangers of tobacco use, and was advised to quit.  Reviewed strategies to maximize success, including removing cigarettes and smoking materials from environment.   - he is advised to maintain close follow up with his oncologist, neurologist, radiologist and PCP [name], FNP for primary care needs.    I spent  26  minutes in the care of the patient today including review of labs from Thyroid Function, CMP, and other relevant labs ; imaging/biopsy records (current and previous including abstractions from other facilities); face-to-face time discussing  his lab results and symptoms, medications doses, his options of short and long term treatment based on the latest standards of care / guidelines;   and documenting the encounter.  Ernesta Amble Trivett  participated in the discussions, expressed understanding, and  voiced agreement with the above plans.  All questions were answered to his satisfaction. he is encouraged to contact clinic should he have any questions or concerns prior to his return visit.    Follow up plan: Return in about 4 months (around 04/25/2023) for Fasting Labs  in AM B4 8.   Glade Lloyd, MD [facility] [address] [place], [place] [address] Phone: [phone]  Fax: [phone]     12/24/2022, 12:53 PM  This note was partially dictated with voice recognition software. Similar sounding words can be transcribed inadequately or may not  be corrected upon review.

## 2022-12-25 ENCOUNTER — Other Ambulatory Visit: Payer: Self-pay

## 2022-12-25 ENCOUNTER — Other Ambulatory Visit (HOSPITAL_COMMUNITY): Payer: Self-pay

## 2022-12-25 MED ORDER — FREESTYLE LIBRE 3 SENSOR MISC
1.0000 | 2 refills | Status: DC
  Filled 2022-12-25: qty 2, 28d supply, fill #0
  Filled 2023-01-22: qty 2, 28d supply, fill #1
  Filled 2023-02-14: qty 2, 28d supply, fill #2

## 2022-12-28 ENCOUNTER — Other Ambulatory Visit: Payer: Self-pay

## 2022-12-28 ENCOUNTER — Other Ambulatory Visit (HOSPITAL_COMMUNITY): Payer: Self-pay

## 2022-12-28 ENCOUNTER — Encounter: Payer: Self-pay | Admitting: Internal Medicine

## 2022-12-28 NOTE — Progress Notes (Unsigned)
[facility] OFFICE PROGRESS NOTE  [name], Bradley Goodman [id number]  DIAGNOSIS: Stage IV (T1b, N3, M1C) non-small cell lung cancer, favoring adenocarcinoma presented with right upper lobe lung nodule in addition to right hilar, subcarinal and bilateral mediastinal as well as supraclavicular lymphadenopathy in addition to bone and brain metastasis diagnosed in June 2022.   PD-L1 expression 80%.     Molecular Studies:  Biomarker Findings Microsatellite status - MS-Stable Tumor Mutational Burden - 6 Muts/Mb Genomic Findings For a complete list of the genes assayed, please refer to the Appendix. KRAS G12C, amplification ATM S470* CCND1 amplification - equivocal? HGF amplification - equivocal? MYC amplification - equivocal? FGF19 amplification - equivocal? FGF3 amplification - equivocal? FGF4 amplification - equivocal? NFKBIA amplification NKX2-1 amplification RAD21 amplification - equivocal? [id number]*26 TERT promoter -124C>T TP53 rearrangement exon 9 7 Disease relevant genes with no reportable alterations: ALK, BRAF, EGFR, ERBB2, MET, RET, ROS1  PRIOR THERAPY:  SRS to the metastatic brain lesions under the care of Dr. Tammi Klippel.  Last treatment on 05/04/2021.   CURRENT THERAPY:  Palliative systemic chemotherapy with carboplatin for an AUC 5, Alimta 500 mg/m2 and, Keytruda 200 mg IV every 3 weeks.  First dose expected on 05/08/2021.  Status post 28 cycles.  Starting from cycle #5 he is on maintenance treatment with Alimta and Keytruda every 3 weeks.    INTERVAL HISTORY: Bradley Goodman 67 y.o. male returns to the clinic today for a follow-up visit accompanied by his wife.  The patient was last seen by myself 3 weeks ago. He feels well today. He reports a few weeks ago he had a low grade fever and chills with T max around 100.4. He took a COVID test and it was negative. He denies sick contacts. Otherwise, he denies any recent fevers/chills,  night sweats, or unexplained weight loss.  Denies any dyspnea, cough, chest pain, or hemoptysis.  Denies any nausea, vomiting, diarrhea, or constipation. The patient reports a long history of migraines. He states he typically gets them in a cluster of 3, however, they have been more frequent in a cluster of 4's recently. He is scheduled for his next brain MRI in May 2023. He is here today for evaluation and repeat blood work before undergoing cycle #29.    MEDICAL HISTORY: Past Medical History:  Diagnosis Date   Cervical spondylolysis    Essential hypertension    GERD (gastroesophageal reflux disease)    History of kidney stones    History of migraine    Hyperlipidemia    Hypertension    PONV (postoperative nausea and vomiting)    Type 2 diabetes mellitus (HCC)     ALLERGIES:  has No Known Allergies.  MEDICATIONS:  Current Outpatient Medications  Medication Sig Dispense Refill   cholecalciferol (VITAMIN D) 1000 UNITS tablet Take 1,000 Units by mouth every evening.     Continuous Blood Gluc Sensor (FREESTYLE LIBRE 3 SENSOR) MISC Place 1 sensor on the skin every 14 days. Use to check glucose continuously 2 each 2   dexamethasone (DECADRON) 1 MG tablet Take 1 tablet (1 mg total) by mouth daily with breakfast. 90 tablet 1   docusate sodium (COLACE) 100 MG capsule Take 100 mg by mouth daily.     folic acid (FOLVITE) 1 MG tablet Take 1 tablet by mouth daily. 90 tablet 4   Krill Oil 300 MG CAPS Take 300 mg by mouth every evening.     levothyroxine (SYNTHROID) 75 MCG  tablet Take 1 tablet (75 mcg total) by mouth daily before breakfast. 90 tablet 1   loratadine (CLARITIN) 10 MG tablet Take 10 mg by mouth every evening.      losartan (COZAAR) 50 MG tablet Take 1 and 1/2 tablets (75 mg total) by mouth daily. 135 tablet 1   mirtazapine (REMERON) 15 MG tablet Take 1 tablet by mouth at bedtime. 90 tablet 1   Multiple Vitamins-Minerals (MULTIVITAMIN GUMMIES ADULT PO) Take 2 each by mouth daily.      pantoprazole (PROTONIX) 40 MG tablet Take 1 tablet by mouth every evening. 90 tablet 1   PRESCRIPTION MEDICATION Testosterone injection     prochlorperazine (COMPAZINE) 10 MG tablet Take 1 tablet (10 mg total) by mouth every 6 (six) hours as needed for nausea or vomiting. 30 tablet 0   rosuvastatin (CRESTOR) 20 MG tablet Take 1 tablet (20 mg total) by mouth daily. 90 tablet 1   Semaglutide,0.25 or 0.5MG /DOS, (OZEMPIC, 0.25 OR 0.5 MG/DOSE,) 2 MG/3ML SOPN Inject 0.5 mg into the skin once a week. 9 mL 1   SYRINGE-NEEDLE, DISP, 3 ML 21G X 1-1/2" 3 ML MISC Use to inject testosterone every week 100 each 2   testosterone cypionate (DEPOTESTOTERONE CYPIONATE) 100 MG/ML injection Take 50mg  ( 0.80ml) and 100mg  ( 3ml) into muscle alternatively weekly. 10 mL 0   tobramycin (TOBREX) 0.3 % ophthalmic solution Place 1 drop into the left eye 4 (four) times daily.     venlafaxine XR (EFFEXOR-XR) 75 MG 24 hr capsule Take 1 capsule (75 mg total) by mouth daily with breakfast. 90 capsule 1   No current facility-administered medications for this visit.    SURGICAL HISTORY:  Past Surgical History:  Procedure Laterality Date   Bilateral inguinal hernia repair     BRONCHIAL NEEDLE ASPIRATION BIOPSY  04/05/2021   Procedure: BRONCHIAL NEEDLE ASPIRATION BIOPSIES;  Surgeon: Garner Nash, DO;  Location: [place];  Service: Pulmonary;;   COLONOSCOPY  01/23/2012   Procedure: COLONOSCOPY;  Surgeon: Daneil Dolin, MD;  [facility];  Service: Endoscopy;  Laterality: N/A;  9:30 AM   COLONOSCOPY N/A 10/11/2015   Procedure: COLONOSCOPY;  Surgeon: Daneil Dolin, MD;  [facility];  Service: Endoscopy;  Laterality: N/A;  830   COLONOSCOPY N/A 10/14/2019   Procedure: COLONOSCOPY;  Surgeon: Daneil Dolin, MD;  [facility];  Service: Endoscopy;  Laterality: N/A;  1:45   POLYPECTOMY  10/14/2019   Procedure: POLYPECTOMY;  Surgeon: Daneil Dolin, MD;  [facility];  Service:  Endoscopy;;  ascending colon, descending colon   VIDEO BRONCHOSCOPY WITH ENDOBRONCHIAL ULTRASOUND N/A 04/05/2021   Procedure: VIDEO BRONCHOSCOPY WITH ENDOBRONCHIAL ULTRASOUND;  Surgeon: Garner Nash, DO;  Location: [place];  Service: Pulmonary;  Laterality: N/A;    REVIEW OF SYSTEMS:   Review of Systems  Constitutional: Negative for appetite change, chills, fatigue, fever and unexpected weight change.  HENT:   Negative for mouth sores, nosebleeds, sore throat and trouble swallowing.   Eyes: Negative for eye problems and icterus.  Respiratory: Negative for cough, hemoptysis, shortness of breath and wheezing.   Cardiovascular: Negative for chest pain and leg swelling.  Gastrointestinal: Negative for abdominal pain, constipation, diarrhea, nausea and vomiting.  Genitourinary: Negative for bladder incontinence, difficulty urinating, dysuria, frequency and hematuria.   Musculoskeletal: Negative for back pain, gait problem, neck pain and neck stiffness.  Skin: Negative for itching and rash.  Neurological: Negative for dizziness, extremity weakness, gait problem,  headaches, light-headedness and seizures.  Hematological: Negative for adenopathy. Does not bruise/bleed easily.  Psychiatric/Behavioral: Negative for confusion, depression and sleep disturbance. The patient is not nervous/anxious.     PHYSICAL EXAMINATION:  Blood pressure 135/88, pulse (!) 107, temperature 98.2 F (36.8 C), temperature source Oral, resp. rate 16, height 5' 11.5" (1.816 m), weight 190 lb (86.2 kg), SpO2 98 %.  ECOG PERFORMANCE STATUS: 1  Physical Exam  Constitutional: Oriented to person, place, and time and well-developed, well-nourished, and in no distress. HENT:  Head: Normocephalic and atraumatic.  Mouth/Throat: Oropharynx is clear and moist. No oropharyngeal exudate.  Eyes: positive for some redness of the left eye. No scleral icterus.  Neck: Normal range of motion. Neck supple.  Cardiovascular: Normal  rate, regular rhythm, normal heart sounds and intact distal pulses.   Pulmonary/Chest: Effort normal and breath sounds normal. No respiratory distress. No wheezes. No rales.  Abdominal: Soft. Bowel sounds are normal. Exhibits no distension and no mass. There is no tenderness.  Musculoskeletal: Normal range of motion. Exhibits no edema.  Lymphadenopathy:    No cervical adenopathy.  Neurological: Alert and oriented to person, place, and time. Exhibits normal muscle tone. Gait normal. Coordination normal.  Skin: Skin is warm and dry. No rash noted. Not diaphoretic. No erythema. No pallor.  Psychiatric: Mood, memory and judgment normal.  Vitals reviewed.  LABORATORY DATA: Lab Results  Component Value Date   WBC 10.9 (H) 01/02/2023   HGB 13.2 01/02/2023   HCT 43.1 01/02/2023   MCV 87.1 01/02/2023   PLT 338 01/02/2023      Chemistry      Component Value Date/Time   NA 140 01/02/2023 0956   NA 144 01/10/2021 0849   K 4.2 01/02/2023 0956   CL 106 01/02/2023 0956   CO2 27 01/02/2023 0956   BUN 13 01/02/2023 0956   BUN 15 01/10/2021 0849   CREATININE 1.02 01/02/2023 0956      Component Value Date/Time   CALCIUM 9.2 01/02/2023 0956   ALKPHOS 58 01/02/2023 0956   AST 18 01/02/2023 0956   ALT 16 01/02/2023 0956   BILITOT 0.3 01/02/2023 0956       RADIOGRAPHIC STUDIES:  No results found.   ASSESSMENT/PLAN:  This is a very pleasant 67 year old Caucasian male diagnosed with stage IV (T1b, N3, M1 C) non-small cell lung cancer, favoring adenocarcinoma.  The patient presented with a right upper lobe lung nodule in addition to right hilar, subcarinal, and bilateral mediastinal lymphadenopathy as well as supraclavicular lymphadenopathy.  The patient also has metastatic disease to the brain.  He was diagnosed in June 2022.  His PD-L1 expression is 80%.  The patient's molecular studies show that he has a K-ras G12C mutation which can be used for targeted treatment in the second line  setting.   The patient completed SRS to the brain lesions under the care of Dr. Tammi Klippel.  His last treatment was on 05/04/2021. 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    The patient is currently undergoing systemic chemotherapy with carboplatin for an AUC of 5, Alimta 500 mg per metered squared, Keytruda 200 mg IV every 3 weeks.  Status post 28 cycles. Starting from cycle #5, the patient started maintenance Alimta and Keytruda IV every 3 weeks.     Labs were reviewed. Recommend that he proceed  with cycle #29 today as scheduled.    We will see him back for a follow up in 3 weeks for  evaluation and repeat blood work before starting cycle #30.   The patient was advised to call immediately if he has any concerning symptoms in the interval. The patient voices understanding of current disease status and treatment options and is in agreement with the current care plan. All questions were answered. The patient knows to call the clinic with any problems, questions or concerns. We can certainly see the patient much sooner if necessary    No orders of the defined types were placed in this encounter.    The total time spent in the appointment was 20-29 minutes  Mairead Schwarzkopf L Audrey Thull, PA-C 01/02/23

## 2022-12-31 ENCOUNTER — Other Ambulatory Visit: Payer: Self-pay

## 2023-01-02 ENCOUNTER — Other Ambulatory Visit: Payer: Self-pay

## 2023-01-02 ENCOUNTER — Other Ambulatory Visit (HOSPITAL_COMMUNITY): Payer: Self-pay

## 2023-01-02 ENCOUNTER — Inpatient Hospital Stay: Payer: Commercial Managed Care - PPO

## 2023-01-02 ENCOUNTER — Inpatient Hospital Stay (HOSPITAL_BASED_OUTPATIENT_CLINIC_OR_DEPARTMENT_OTHER): Payer: Commercial Managed Care - PPO | Admitting: Physician Assistant

## 2023-01-02 VITALS — BP 135/88 | HR 107 | Temp 98.2°F | Resp 16 | Ht 71.5 in | Wt 190.0 lb

## 2023-01-02 VITALS — HR 98

## 2023-01-02 DIAGNOSIS — C3491 Malignant neoplasm of unspecified part of right bronchus or lung: Secondary | ICD-10-CM

## 2023-01-02 DIAGNOSIS — Z5111 Encounter for antineoplastic chemotherapy: Secondary | ICD-10-CM | POA: Diagnosis not present

## 2023-01-02 DIAGNOSIS — Z5112 Encounter for antineoplastic immunotherapy: Secondary | ICD-10-CM | POA: Diagnosis not present

## 2023-01-02 LAB — CMP (CANCER CENTER ONLY)
ALT: 16 U/L (ref 0–44)
AST: 18 U/L (ref 15–41)
Albumin: 3.8 g/dL (ref 3.5–5.0)
Alkaline Phosphatase: 58 U/L (ref 38–126)
Anion gap: 7 (ref 5–15)
BUN: 13 mg/dL (ref 8–23)
CO2: 27 mmol/L (ref 22–32)
Calcium: 9.2 mg/dL (ref 8.9–10.3)
Chloride: 106 mmol/L (ref 98–111)
Creatinine: 1.02 mg/dL (ref 0.61–1.24)
GFR, Estimated: >60 mL/min (ref >60)
Glucose, Bld: 90 mg/dL (ref 70–99)
Potassium: 4.2 mmol/L (ref 3.5–5.1)
Sodium: 140 mmol/L (ref 135–145)
Total Bilirubin: 0.3 mg/dL (ref 0.3–1.2)
Total Protein: 6.7 g/dL (ref 6.5–8.1)

## 2023-01-02 LAB — CBC WITH DIFFERENTIAL (CANCER CENTER ONLY)
Abs Immature Granulocytes: 0.05 10*3/uL (ref 0.00–0.07)
Basophils Absolute: 0.1 10*3/uL (ref 0.0–0.1)
Basophils Relative: 1 %
Eosinophils Absolute: 0.1 10*3/uL (ref 0.0–0.5)
Eosinophils Relative: 1 %
HCT: 43.1 % (ref 39.0–52.0)
Hemoglobin: 13.2 g/dL (ref 13.0–17.0)
Immature Granulocytes: 1 %
Lymphocytes Relative: 19 %
Lymphs Abs: 2.1 10*3/uL (ref 0.7–4.0)
MCH: 26.7 pg (ref 26.0–34.0)
MCHC: 30.6 g/dL (ref 30.0–36.0)
MCV: 87.1 fL (ref 80.0–100.0)
Monocytes Absolute: 0.6 10*3/uL (ref 0.1–1.0)
Monocytes Relative: 6 %
Neutro Abs: 8 10*3/uL — ABNORMAL HIGH (ref 1.7–7.7)
Neutrophils Relative %: 72 %
Platelet Count: 338 10*3/uL (ref 150–400)
RBC: 4.95 MIL/uL (ref 4.22–5.81)
RDW: 18.6 % — ABNORMAL HIGH (ref 11.5–15.5)
WBC Count: 10.9 10*3/uL — ABNORMAL HIGH (ref 4.0–10.5)
nRBC: 0 % (ref 0.0–0.2)

## 2023-01-02 MED ORDER — SODIUM CHLORIDE 0.9 % IV SOLN
200.0000 mg | Freq: Once | INTRAVENOUS | Status: AC
  Administered 2023-01-02: 200 mg via INTRAVENOUS
  Filled 2023-01-02: qty 8

## 2023-01-02 MED ORDER — PROCHLORPERAZINE MALEATE 10 MG PO TABS
10.0000 mg | ORAL_TABLET | Freq: Once | ORAL | Status: AC
  Administered 2023-01-02: 10 mg via ORAL
  Filled 2023-01-02: qty 1

## 2023-01-02 MED ORDER — SODIUM CHLORIDE 0.9 % IV SOLN: Freq: Once | INTRAVENOUS | Status: AC

## 2023-01-02 MED ORDER — SODIUM CHLORIDE 0.9 % IV SOLN
500.0000 mg/m2 | Freq: Once | INTRAVENOUS | Status: AC
  Administered 2023-01-02: 1000 mg via INTRAVENOUS
  Filled 2023-01-02: qty 40

## 2023-01-02 NOTE — Progress Notes (Signed)
Patient seen by PA today  Vitals are not all within treatment parameters. Elevated heart rate.  Provider aware, No new orders given at this time.   Labs reviewed: and are within treatment parameters.  Per physician team, patient is ready for treatment and there are NO modifications to the treatment plan.

## 2023-01-02 NOTE — Patient Instructions (Signed)
[facility]  Discharge Instructions: Thank you for choosing [facility] to provide your oncology and hematology care.   If you have a lab appointment with the Cancer Center, please go directly to the Cancer Center and check in at the registration area.   Wear comfortable clothing and clothing appropriate for easy access to any Portacath or PICC line.   We strive to give you quality time with your provider. You may need to reschedule your appointment if you arrive late (15 or more minutes).  Arriving late affects you and other patients whose appointments are after yours.  Also, if you miss three or more appointments without notifying the office, you may be dismissed from the clinic at the provider's discretion.      For prescription refill requests, have your pharmacy contact our office and allow 72 hours for refills to be completed.    Today you received the following chemotherapy and/or immunotherapy agents: Keytruda/Alimta      To help prevent nausea and vomiting after your treatment, we encourage you to take your nausea medication as directed.  BELOW ARE SYMPTOMS THAT SHOULD BE REPORTED IMMEDIATELY: *FEVER GREATER THAN 100.4 F (38 C) OR HIGHER *CHILLS OR SWEATING *NAUSEA AND VOMITING THAT IS NOT CONTROLLED WITH YOUR NAUSEA MEDICATION *UNUSUAL SHORTNESS OF BREATH *UNUSUAL BRUISING OR BLEEDING *URINARY PROBLEMS (pain or burning when urinating, or frequent urination) *BOWEL PROBLEMS (unusual diarrhea, constipation, pain near the anus) TENDERNESS IN MOUTH AND THROAT WITH OR WITHOUT PRESENCE OF ULCERS (sore throat, sores in mouth, or a toothache) UNUSUAL RASH, SWELLING OR PAIN  UNUSUAL VAGINAL DISCHARGE OR ITCHING   Items with * indicate a potential emergency and should be followed up as soon as possible or go to the Emergency Department if any problems should occur.  Please show the CHEMOTHERAPY ALERT CARD or IMMUNOTHERAPY ALERT CARD at  check-in to the Emergency Department and triage nurse.  Should you have questions after your visit or need to cancel or reschedule your appointment, please contact [facility]  Dept: [phone]  and follow the prompts.  Office hours are 8:00 a.m. to 4:30 p.m. Monday - Friday. Please note that voicemails left after 4:00 p.m. may not be returned until the following business day.  We are closed weekends and major holidays. You have access to a nurse at all times for urgent questions. Please call the main number to the clinic Dept: [phone] and follow the prompts.   For any non-urgent questions, you may also contact your provider using MyChart. We now offer e-Visits for anyone 18 and older to request care online for non-urgent symptoms. For details visit [email or web address].   Also download the MyChart app! Go to the app store, search "MyChart", open the app, select [facility], and log in with your MyChart username and password.   

## 2023-01-08 ENCOUNTER — Other Ambulatory Visit: Payer: Self-pay

## 2023-01-14 ENCOUNTER — Encounter: Payer: Self-pay | Admitting: Internal Medicine

## 2023-01-14 ENCOUNTER — Other Ambulatory Visit: Payer: Self-pay | Admitting: Physician Assistant

## 2023-01-14 ENCOUNTER — Ambulatory Visit: Payer: Commercial Managed Care - PPO

## 2023-01-14 ENCOUNTER — Other Ambulatory Visit: Payer: Commercial Managed Care - PPO

## 2023-01-14 ENCOUNTER — Ambulatory Visit: Payer: Commercial Managed Care - PPO | Admitting: Physician Assistant

## 2023-01-14 ENCOUNTER — Encounter: Payer: Self-pay | Admitting: Physician Assistant

## 2023-01-14 DIAGNOSIS — L0390 Cellulitis, unspecified: Secondary | ICD-10-CM

## 2023-01-14 MED ORDER — CEPHALEXIN 500 MG PO CAPS: 500.0000 mg | ORAL_CAPSULE | Freq: Four times a day (QID) | ORAL | 0 refills | Status: DC

## 2023-01-22 ENCOUNTER — Other Ambulatory Visit: Payer: Self-pay | Admitting: Family Medicine

## 2023-01-22 ENCOUNTER — Other Ambulatory Visit (HOSPITAL_COMMUNITY): Payer: Self-pay

## 2023-01-22 DIAGNOSIS — G479 Sleep disorder, unspecified: Secondary | ICD-10-CM

## 2023-01-22 DIAGNOSIS — R630 Anorexia: Secondary | ICD-10-CM

## 2023-01-22 DIAGNOSIS — F3340 Major depressive disorder, recurrent, in remission, unspecified: Secondary | ICD-10-CM

## 2023-01-22 DIAGNOSIS — K219 Gastro-esophageal reflux disease without esophagitis: Secondary | ICD-10-CM

## 2023-01-23 ENCOUNTER — Inpatient Hospital Stay: Payer: Commercial Managed Care - PPO | Attending: Physician Assistant

## 2023-01-23 ENCOUNTER — Inpatient Hospital Stay (HOSPITAL_BASED_OUTPATIENT_CLINIC_OR_DEPARTMENT_OTHER): Payer: Commercial Managed Care - PPO | Admitting: Internal Medicine

## 2023-01-23 ENCOUNTER — Encounter: Payer: Self-pay | Admitting: Medical Oncology

## 2023-01-23 ENCOUNTER — Other Ambulatory Visit: Payer: Self-pay

## 2023-01-23 ENCOUNTER — Inpatient Hospital Stay: Payer: Commercial Managed Care - PPO

## 2023-01-23 VITALS — BP 148/92 | HR 100 | Temp 98.0°F | Resp 15 | Wt 188.6 lb

## 2023-01-23 DIAGNOSIS — Z5112 Encounter for antineoplastic immunotherapy: Secondary | ICD-10-CM | POA: Insufficient documentation

## 2023-01-23 DIAGNOSIS — C3490 Malignant neoplasm of unspecified part of unspecified bronchus or lung: Secondary | ICD-10-CM | POA: Diagnosis not present

## 2023-01-23 DIAGNOSIS — Z5111 Encounter for antineoplastic chemotherapy: Secondary | ICD-10-CM | POA: Diagnosis not present

## 2023-01-23 DIAGNOSIS — C3411 Malignant neoplasm of upper lobe, right bronchus or lung: Secondary | ICD-10-CM | POA: Insufficient documentation

## 2023-01-23 DIAGNOSIS — R634 Abnormal weight loss: Secondary | ICD-10-CM | POA: Insufficient documentation

## 2023-01-23 DIAGNOSIS — C7951 Secondary malignant neoplasm of bone: Secondary | ICD-10-CM | POA: Insufficient documentation

## 2023-01-23 DIAGNOSIS — Z79620 Long term (current) use of immunosuppressive biologic: Secondary | ICD-10-CM | POA: Diagnosis not present

## 2023-01-23 DIAGNOSIS — C3491 Malignant neoplasm of unspecified part of right bronchus or lung: Secondary | ICD-10-CM | POA: Diagnosis not present

## 2023-01-23 DIAGNOSIS — Z923 Personal history of irradiation: Secondary | ICD-10-CM | POA: Diagnosis not present

## 2023-01-23 DIAGNOSIS — C7931 Secondary malignant neoplasm of brain: Secondary | ICD-10-CM | POA: Insufficient documentation

## 2023-01-23 LAB — CBC WITH DIFFERENTIAL (CANCER CENTER ONLY)
Abs Immature Granulocytes: 0.04 10*3/uL (ref 0.00–0.07)
Basophils Absolute: 0.1 10*3/uL (ref 0.0–0.1)
Basophils Relative: 1 %
Eosinophils Absolute: 0.1 10*3/uL (ref 0.0–0.5)
Eosinophils Relative: 1 %
HCT: 40.6 % (ref 39.0–52.0)
Hemoglobin: 12.4 g/dL — ABNORMAL LOW (ref 13.0–17.0)
Immature Granulocytes: 0 %
Lymphocytes Relative: 23 %
Lymphs Abs: 2.3 10*3/uL (ref 0.7–4.0)
MCH: 26.2 pg (ref 26.0–34.0)
MCHC: 30.5 g/dL (ref 30.0–36.0)
MCV: 85.7 fL (ref 80.0–100.0)
Monocytes Absolute: 0.6 10*3/uL (ref 0.1–1.0)
Monocytes Relative: 6 %
Neutro Abs: 6.6 10*3/uL (ref 1.7–7.7)
Neutrophils Relative %: 69 %
Platelet Count: 331 10*3/uL (ref 150–400)
RBC: 4.74 MIL/uL (ref 4.22–5.81)
RDW: 19 % — ABNORMAL HIGH (ref 11.5–15.5)
WBC Count: 9.7 10*3/uL (ref 4.0–10.5)
nRBC: 0 % (ref 0.0–0.2)

## 2023-01-23 LAB — CMP (CANCER CENTER ONLY)
ALT: 17 U/L (ref 0–44)
AST: 19 U/L (ref 15–41)
Albumin: 3.7 g/dL (ref 3.5–5.0)
Alkaline Phosphatase: 67 U/L (ref 38–126)
Anion gap: 4 — ABNORMAL LOW (ref 5–15)
BUN: 12 mg/dL (ref 8–23)
CO2: 29 mmol/L (ref 22–32)
Calcium: 9 mg/dL (ref 8.9–10.3)
Chloride: 107 mmol/L (ref 98–111)
Creatinine: 1.05 mg/dL (ref 0.61–1.24)
GFR, Estimated: >60 mL/min (ref >60)
Glucose, Bld: 90 mg/dL (ref 70–99)
Potassium: 4 mmol/L (ref 3.5–5.1)
Sodium: 140 mmol/L (ref 135–145)
Total Bilirubin: 0.3 mg/dL (ref 0.3–1.2)
Total Protein: 6.6 g/dL (ref 6.5–8.1)

## 2023-01-23 MED ORDER — CYANOCOBALAMIN 1000 MCG/ML IJ SOLN
1000.0000 ug | Freq: Once | INTRAMUSCULAR | Status: AC
  Administered 2023-01-23: 1000 ug via INTRAMUSCULAR
  Filled 2023-01-23: qty 1

## 2023-01-23 MED ORDER — MIRTAZAPINE 15 MG PO TABS
15.0000 mg | ORAL_TABLET | Freq: Every day | ORAL | 0 refills | Status: DC
  Filled 2023-01-23: qty 90, 90d supply, fill #0

## 2023-01-23 MED ORDER — SODIUM CHLORIDE 0.9 % IV SOLN
200.0000 mg | Freq: Once | INTRAVENOUS | Status: AC
  Administered 2023-01-23: 200 mg via INTRAVENOUS
  Filled 2023-01-23: qty 200

## 2023-01-23 MED ORDER — PANTOPRAZOLE SODIUM 40 MG PO TBEC
40.0000 mg | DELAYED_RELEASE_TABLET | Freq: Every evening | ORAL | 0 refills | Status: DC
  Filled 2023-01-23: qty 90, 90d supply, fill #0

## 2023-01-23 MED ORDER — SODIUM CHLORIDE 0.9 % IV SOLN: Freq: Once | INTRAVENOUS | Status: AC

## 2023-01-23 MED ORDER — PROCHLORPERAZINE MALEATE 10 MG PO TABS
10.0000 mg | ORAL_TABLET | Freq: Once | ORAL | Status: AC
  Administered 2023-01-23: 10 mg via ORAL
  Filled 2023-01-23: qty 1

## 2023-01-23 MED ORDER — SODIUM CHLORIDE 0.9 % IV SOLN
500.0000 mg/m2 | Freq: Once | INTRAVENOUS | Status: AC
  Administered 2023-01-23: 1000 mg via INTRAVENOUS
  Filled 2023-01-23: qty 40

## 2023-01-23 NOTE — Progress Notes (Signed)
[facility] Telephone:([phone]) [phone]   [phone]  OFFICE PROGRESS NOTE  [name], FNP [address] [place] [address]  DIAGNOSIS: Stage IV (T1b, N3, M1C) non-small cell lung cancer, favoring adenocarcinoma presented with right upper lobe lung nodule in addition to right hilar, subcarinal and bilateral mediastinal as well as supraclavicular lymphadenopathy in addition to bone and brain metastasis diagnosed in June 2022.     PD-L1 expression 80%.     Molecular Studies:  Biomarker Findings Microsatellite status - MS-Stable Tumor Mutational Burden - 6 Muts/Mb Genomic Findings For a complete list of the genes assayed, please refer to the Appendix. KRAS G12C, amplification ATM S470* CCND1 amplification - equivocal? HGF amplification - equivocal? MYC amplification - equivocal? FGF19 amplification - equivocal? FGF3 amplification - equivocal? FGF4 amplification - equivocal? NFKBIA amplification NKX2-1 amplification RAD21 amplification - equivocal? [id number]*26 TERT promoter -124C>T TP53 rearrangement exon 9 7 Disease relevant genes with no reportable alterations: ALK, BRAF, EGFR, ERBB2, MET, RET, ROS1   PRIOR THERAPY: SRS to the metastatic brain lesions under the care of Dr. Kathrynn Running.  Last treatment on 05/04/2021.   CURRENT THERAPY: Palliative systemic chemotherapy with carboplatin for an AUC 5, Alimta 500 mg/m2 and, Keytruda 200 mg IV every 3 weeks.  First dose expected on 05/08/2021.  Status post 29 cycles.  Starting from cycle #5 he is on maintenance treatment with Alimta and Keytruda every 3 weeks.  INTERVAL HISTORY: Bradley Goodman 67 y.o. male returns to the clinic today for follow-up visit accompanied by his wife.  The patient is feeling fine today with no concerning complaints except for mild swelling and erythema of his right leg.  He was treated recently with Keflex and has some improvement.  He denied having any chest pain,  shortness of breath, cough or hemoptysis.  He has no nausea, vomiting, diarrhea or constipation.  He has no headache or visual changes.  He lost few pounds secondary to diuresis.  He is here today for evaluation before starting cycle #30 of his treatment.  MEDICAL HISTORY: Past Medical History:  Diagnosis Date   Cervical spondylolysis    Essential hypertension    GERD (gastroesophageal reflux disease)    History of kidney stones    History of migraine    Hyperlipidemia    Hypertension    PONV (postoperative nausea and vomiting)    Type 2 diabetes mellitus (HCC)     ALLERGIES:  has No Known Allergies.  MEDICATIONS:  Current Outpatient Medications  Medication Sig Dispense Refill   cephALEXin (KEFLEX) 500 MG capsule Take 1 capsule (500 mg total) by mouth 4 (four) times daily. 20 capsule 0   cholecalciferol (VITAMIN D) 1000 UNITS tablet Take 1,000 Units by mouth every evening.     Continuous Glucose Sensor (FREESTYLE LIBRE 3 SENSOR) MISC Place 1 sensor on the skin every 14 days. Use to check glucose continuously 2 each 2   dexamethasone (DECADRON) 1 MG tablet Take 1 tablet (1 mg total) by mouth daily with breakfast. 90 tablet 1   docusate sodium (COLACE) 100 MG capsule Take 100 mg by mouth daily.     folic acid (FOLVITE) 1 MG tablet Take 1 tablet by mouth daily. 90 tablet 4   Krill Oil 300 MG CAPS Take 300 mg by mouth every evening.     levothyroxine (SYNTHROID) 75 MCG tablet Take 1 tablet (75 mcg total) by mouth daily before breakfast. 90 tablet 1   loratadine (CLARITIN) 10  MG tablet Take 10 mg by mouth every evening.      losartan (COZAAR) 50 MG tablet Take 1 and 1/2 tablets (75 mg total) by mouth daily. 135 tablet 1   mirtazapine (REMERON) 15 MG tablet Take 1 tablet by mouth at bedtime. 90 tablet 0   Multiple Vitamins-Minerals (MULTIVITAMIN GUMMIES ADULT PO) Take 2 each by mouth daily.     pantoprazole (PROTONIX) 40 MG tablet Take 1 tablet by mouth every evening. 90 tablet 0    PRESCRIPTION MEDICATION Testosterone injection     prochlorperazine (COMPAZINE) 10 MG tablet Take 1 tablet (10 mg total) by mouth every 6 (six) hours as needed for nausea or vomiting. 30 tablet 0   rosuvastatin (CRESTOR) 20 MG tablet Take 1 tablet (20 mg total) by mouth daily. 90 tablet 1   Semaglutide,0.25 or 0.5MG /DOS, (OZEMPIC, 0.25 OR 0.5 MG/DOSE,) 2 MG/3ML SOPN Inject 0.5 mg into the skin once a week. 9 mL 1   SYRINGE-NEEDLE, DISP, 3 ML 21G X 1-1/2" 3 ML MISC Use to inject testosterone every week 100 each 2   testosterone cypionate (DEPOTESTOTERONE CYPIONATE) 100 MG/ML injection Take  ( 0.54ml) and  ( 1ml) into muscle alternatively weekly. 10 mL 0   tobramycin (TOBREX) 0.3 % ophthalmic solution Place 1 drop into the left eye 4 (four) times daily.     venlafaxine XR (EFFEXOR-XR) 75 MG 24 hr capsule Take 1 capsule (75 mg total) by mouth daily with breakfast. 90 capsule 1   No current facility-administered medications for this visit.    SURGICAL HISTORY:  Past Surgical History:  Procedure Laterality Date   Bilateral inguinal hernia repair     BRONCHIAL NEEDLE ASPIRATION BIOPSY  04/05/2021   Procedure: BRONCHIAL NEEDLE ASPIRATION BIOPSIES;  Surgeon: Josephine Igo, DO;  [facility];  Service: Pulmonary;;   COLONOSCOPY  01/23/2012   Procedure: COLONOSCOPY;  Surgeon: Corbin Ade, MD;  [facility];  Service: Endoscopy;  Laterality: N/A;  9:30 AM   COLONOSCOPY N/A 10/11/2015   Procedure: COLONOSCOPY;  Surgeon: Corbin Ade, MD;  [facility];  Service: Endoscopy;  Laterality: N/A;  830   COLONOSCOPY N/A 10/14/2019   Procedure: COLONOSCOPY;  Surgeon: Corbin Ade, MD;  [facility];  Service: Endoscopy;  Laterality: N/A;  1:45   POLYPECTOMY  10/14/2019   Procedure: POLYPECTOMY;  Surgeon: Corbin Ade, MD;  [facility];  Service: Endoscopy;;  ascending colon, descending colon   VIDEO BRONCHOSCOPY WITH ENDOBRONCHIAL ULTRASOUND  N/A 04/05/2021   Procedure: VIDEO BRONCHOSCOPY WITH ENDOBRONCHIAL ULTRASOUND;  Surgeon: Josephine Igo, DO;  Location[facility];  Service: Pulmonary;  Laterality: N/A;    REVIEW OF SYSTEMS:  A comprehensive review of systems was negative except for: Constitutional: positive for fatigue   PHYSICAL EXAMINATION: General appearance: alert, cooperative, appears stated age, and no distress Head: Normocephalic, without obvious abnormality, atraumatic Neck: no adenopathy, no JVD, supple, symmetrical, trachea midline, and thyroid not enlarged, symmetric, no tenderness/mass/nodules Lymph nodes: Cervical, supraclavicular, and axillary nodes normal. Resp: clear to auscultation bilaterally Back: symmetric, no curvature. ROM normal. No CVA tenderness. Cardio: regular rate and rhythm, S1, S2 normal, no murmur, click, rub or gallop GI: soft, non-tender; bowel sounds normal; no masses,  no organomegaly Extremities: extremities normal, atraumatic, no cyanosis or edema  ECOG PERFORMANCE STATUS: 1 - Symptomatic but completely ambulatory  Blood pressure (!) 148/92, pulse 100, temperature 98 F (36.7 C), temperature source Oral, resp. rate 15, weight 188  lb 9.6 oz (85.5 kg), SpO2 97 %.  LABORATORY DATA: Lab Results  Component Value Date   WBC 9.7 01/23/2023   HGB 12.4 (L) 01/23/2023   HCT 40.6 01/23/2023   MCV 85.7 01/23/2023   PLT 331 01/23/2023      Chemistry      Component Value Date/Time   NA 140 01/23/2023 1043   NA 144 01/10/2021 0849   K 4.0 01/23/2023 1043   CL 107 01/23/2023 1043   CO2 29 01/23/2023 1043   BUN 12 01/23/2023 1043   BUN 15 01/10/2021 0849   CREATININE 1.05 01/23/2023 1043      Component Value Date/Time   CALCIUM 9.0 01/23/2023 1043   ALKPHOS 67 01/23/2023 1043   AST 19 01/23/2023 1043   ALT 17 01/23/2023 1043   BILITOT 0.3 01/23/2023 1043       RADIOGRAPHIC STUDIES: No results found.  ASSESSMENT AND PLAN: This is a very pleasant 67 years old white male  recently diagnosed with stage IV (T1b, N3, M1 C) non-small cell lung cancer favoring adenocarcinoma presented with right upper lobe lung nodule in addition to right hilar, subcarinal and bilateral mediastinal as well as supraclavicular lymphadenopathy.  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  His PD-L1 expression is 80% and his molecular studies showed KRAS G12C mutation. The patient underwent SRS to metastatic brain lesion under the care of Dr. Kathrynn Running and he is currently undergoing systemic chemotherapy with carboplatin for AUC of 5, Alimta 500 Mg/M2 and Keytruda 200 Mg IV every 3 weeks status post 29 cycles.  Starting from cycle #5 the patient will be treated with maintenance treatment with Alimta and Keytruda every 3 weeks. The patient has been tolerating this treatment well with no concerning adverse effect except for mild fatigue. I recommended for him to proceed with cycle #30 today as planned. I will see him back for follow-up visit in 3 weeks for evaluation with repeat CT scan of the chest, abdomen and pelvis for restaging of his disease. The patient was advised to call immediately if he has any concerning symptoms in the interval. The patient voices understanding of current disease status and treatment options and is in agreement with the current care plan.  All questions were answered. The patient knows to call the clinic with any problems, questions or concerns. We can certainly see the patient much sooner if necessary. The total time spent in the appointment was 20 minutes.  Disclaimer: This note was dictated with voice recognition software. Similar sounding words can inadvertently be transcribed and may not be corrected upon review.

## 2023-01-23 NOTE — Addendum Note (Signed)
Addended by: Charma Igo on: 01/23/2023 12:05 PM   Modules accepted: Orders

## 2023-01-23 NOTE — Progress Notes (Signed)
Patient seen by Dr. Mohamed  Vitals are within treatment parameters.  Labs reviewed: and are within treatment parameters.  Per physician team, patient is ready for treatment and there are NO modifications to the treatment plan.  

## 2023-01-23 NOTE — Patient Instructions (Signed)
[facility]  Discharge Instructions: Thank you for choosing [facility] to provide your oncology and hematology care.   If you have a lab appointment with the Cancer Center, please go directly to the Cancer Center and check in at the registration area.   Wear comfortable clothing and clothing appropriate for easy access to any Portacath or PICC line.   We strive to give you quality time with your provider. You may need to reschedule your appointment if you arrive late (15 or more minutes).  Arriving late affects you and other patients whose appointments are after yours.  Also, if you miss three or more appointments without notifying the office, you may be dismissed from the clinic at the provider's discretion.      For prescription refill requests, have your pharmacy contact our office and allow 72 hours for refills to be completed.    Today you received the following chemotherapy and/or immunotherapy agents: Keytruda, Alimta.       To help prevent nausea and vomiting after your treatment, we encourage you to take your nausea medication as directed.  BELOW ARE SYMPTOMS THAT SHOULD BE REPORTED IMMEDIATELY: *FEVER GREATER THAN 100.4 F (38 C) OR HIGHER *CHILLS OR SWEATING *NAUSEA AND VOMITING THAT IS NOT CONTROLLED WITH YOUR NAUSEA MEDICATION *UNUSUAL SHORTNESS OF BREATH *UNUSUAL BRUISING OR BLEEDING *URINARY PROBLEMS (pain or burning when urinating, or frequent urination) *BOWEL PROBLEMS (unusual diarrhea, constipation, pain near the anus) TENDERNESS IN MOUTH AND THROAT WITH OR WITHOUT PRESENCE OF ULCERS (sore throat, sores in mouth, or a toothache) UNUSUAL RASH, SWELLING OR PAIN  UNUSUAL VAGINAL DISCHARGE OR ITCHING   Items with * indicate a potential emergency and should be followed up as soon as possible or go to the Emergency Department if any problems should occur.  Please show the CHEMOTHERAPY ALERT CARD or IMMUNOTHERAPY ALERT CARD  at check-in to the Emergency Department and triage nurse.  Should you have questions after your visit or need to cancel or reschedule your appointment, please contact [facility]  Dept: [phone]  and follow the prompts.  Office hours are 8:00 a.m. to 4:30 p.m. Monday - Friday. Please note that voicemails left after 4:00 p.m. may not be returned until the following business day.  We are closed weekends and major holidays. You have access to a nurse at all times for urgent questions. Please call the main number to the clinic Dept: [phone] and follow the prompts.   For any non-urgent questions, you may also contact your provider using MyChart. We now offer e-Visits for anyone 18 and older to request care online for non-urgent symptoms. For details visit [email or web address].   Also download the MyChart app! Go to the app store, search "MyChart", open the app, select [facility], and log in with your MyChart username and password.   

## 2023-01-24 ENCOUNTER — Other Ambulatory Visit: Payer: Commercial Managed Care - PPO

## 2023-01-24 ENCOUNTER — Ambulatory Visit: Payer: Commercial Managed Care - PPO

## 2023-01-24 ENCOUNTER — Ambulatory Visit: Payer: Commercial Managed Care - PPO | Admitting: Internal Medicine

## 2023-01-29 ENCOUNTER — Telehealth: Payer: Self-pay | Admitting: Internal Medicine

## 2023-01-29 NOTE — Telephone Encounter (Signed)
Called patient regarding upcoming May/June appointments, patient is notified.  

## 2023-02-04 ENCOUNTER — Encounter: Payer: Self-pay | Admitting: Internal Medicine

## 2023-02-07 ENCOUNTER — Other Ambulatory Visit: Payer: Self-pay

## 2023-02-11 ENCOUNTER — Ambulatory Visit: Payer: Commercial Managed Care - PPO | Admitting: Internal Medicine

## 2023-02-11 ENCOUNTER — Other Ambulatory Visit: Payer: Commercial Managed Care - PPO

## 2023-02-11 ENCOUNTER — Ambulatory Visit: Payer: Commercial Managed Care - PPO

## 2023-02-11 ENCOUNTER — Ambulatory Visit (HOSPITAL_COMMUNITY)
Admission: RE | Admit: 2023-02-11 | Discharge: 2023-02-11 | Disposition: A | Discharge status: Home / Self Care | Payer: Commercial Managed Care - PPO | Source: Ambulatory Visit | Attending: Internal Medicine | Admitting: Internal Medicine

## 2023-02-11 DIAGNOSIS — J439 Emphysema, unspecified: Secondary | ICD-10-CM | POA: Diagnosis not present

## 2023-02-11 DIAGNOSIS — R918 Other nonspecific abnormal finding of lung field: Secondary | ICD-10-CM | POA: Diagnosis not present

## 2023-02-11 DIAGNOSIS — K769 Liver disease, unspecified: Secondary | ICD-10-CM | POA: Diagnosis not present

## 2023-02-11 DIAGNOSIS — C3490 Malignant neoplasm of unspecified part of unspecified bronchus or lung: Secondary | ICD-10-CM | POA: Diagnosis not present

## 2023-02-11 DIAGNOSIS — N200 Calculus of kidney: Secondary | ICD-10-CM | POA: Diagnosis not present

## 2023-02-11 MED ORDER — SODIUM CHLORIDE (PF) 0.9 % IJ SOLN
INTRAMUSCULAR | Status: AC
  Filled 2023-02-11: qty 50

## 2023-02-11 MED ORDER — IOHEXOL 300 MG/ML  SOLN
100.0000 mL | Freq: Once | INTRAMUSCULAR | Status: AC | PRN
  Administered 2023-02-11: 100 mL via INTRAVENOUS

## 2023-02-12 ENCOUNTER — Encounter: Payer: Self-pay | Admitting: Medical Oncology

## 2023-02-12 ENCOUNTER — Inpatient Hospital Stay: Payer: Commercial Managed Care - PPO | Attending: Physician Assistant

## 2023-02-12 ENCOUNTER — Inpatient Hospital Stay: Payer: Commercial Managed Care - PPO

## 2023-02-12 ENCOUNTER — Inpatient Hospital Stay (HOSPITAL_BASED_OUTPATIENT_CLINIC_OR_DEPARTMENT_OTHER): Payer: Commercial Managed Care - PPO | Admitting: Internal Medicine

## 2023-02-12 VITALS — BP 135/84 | HR 93 | Resp 16

## 2023-02-12 DIAGNOSIS — C3491 Malignant neoplasm of unspecified part of right bronchus or lung: Secondary | ICD-10-CM | POA: Diagnosis not present

## 2023-02-12 DIAGNOSIS — C3411 Malignant neoplasm of upper lobe, right bronchus or lung: Secondary | ICD-10-CM | POA: Insufficient documentation

## 2023-02-12 DIAGNOSIS — Z79620 Long term (current) use of immunosuppressive biologic: Secondary | ICD-10-CM | POA: Insufficient documentation

## 2023-02-12 DIAGNOSIS — F17210 Nicotine dependence, cigarettes, uncomplicated: Secondary | ICD-10-CM | POA: Insufficient documentation

## 2023-02-12 DIAGNOSIS — Z923 Personal history of irradiation: Secondary | ICD-10-CM | POA: Insufficient documentation

## 2023-02-12 DIAGNOSIS — C7951 Secondary malignant neoplasm of bone: Secondary | ICD-10-CM | POA: Insufficient documentation

## 2023-02-12 DIAGNOSIS — Z5111 Encounter for antineoplastic chemotherapy: Secondary | ICD-10-CM | POA: Insufficient documentation

## 2023-02-12 DIAGNOSIS — Z79899 Other long term (current) drug therapy: Secondary | ICD-10-CM | POA: Insufficient documentation

## 2023-02-12 DIAGNOSIS — C7931 Secondary malignant neoplasm of brain: Secondary | ICD-10-CM | POA: Diagnosis not present

## 2023-02-12 DIAGNOSIS — Z5112 Encounter for antineoplastic immunotherapy: Secondary | ICD-10-CM | POA: Diagnosis not present

## 2023-02-12 LAB — CBC WITH DIFFERENTIAL (CANCER CENTER ONLY)
Abs Immature Granulocytes: 0.07 10*3/uL (ref 0.00–0.07)
Basophils Absolute: 0.1 10*3/uL (ref 0.0–0.1)
Basophils Relative: 1 %
Eosinophils Absolute: 0.2 10*3/uL (ref 0.0–0.5)
Eosinophils Relative: 2 %
HCT: 39.6 % (ref 39.0–52.0)
Hemoglobin: 12.2 g/dL — ABNORMAL LOW (ref 13.0–17.0)
Immature Granulocytes: 1 %
Lymphocytes Relative: 14 %
Lymphs Abs: 1.4 10*3/uL (ref 0.7–4.0)
MCH: 26.4 pg (ref 26.0–34.0)
MCHC: 30.8 g/dL (ref 30.0–36.0)
MCV: 85.7 fL (ref 80.0–100.0)
Monocytes Absolute: 0.6 10*3/uL (ref 0.1–1.0)
Monocytes Relative: 6 %
Neutro Abs: 8.2 10*3/uL — ABNORMAL HIGH (ref 1.7–7.7)
Neutrophils Relative %: 76 %
Platelet Count: 283 10*3/uL (ref 150–400)
RBC: 4.62 MIL/uL (ref 4.22–5.81)
RDW: 19.8 % — ABNORMAL HIGH (ref 11.5–15.5)
WBC Count: 10.5 10*3/uL (ref 4.0–10.5)
nRBC: 0 % (ref 0.0–0.2)

## 2023-02-12 LAB — TSH: TSH: 1.189 u[IU]/mL (ref 0.350–4.500)

## 2023-02-12 LAB — CMP (CANCER CENTER ONLY)
ALT: 21 U/L (ref 0–44)
AST: 21 U/L (ref 15–41)
Albumin: 3.6 g/dL (ref 3.5–5.0)
Alkaline Phosphatase: 60 U/L (ref 38–126)
Anion gap: 8 (ref 5–15)
BUN: 10 mg/dL (ref 8–23)
CO2: 24 mmol/L (ref 22–32)
Calcium: 8.7 mg/dL — ABNORMAL LOW (ref 8.9–10.3)
Chloride: 107 mmol/L (ref 98–111)
Creatinine: 0.99 mg/dL (ref 0.61–1.24)
GFR, Estimated: >60 mL/min (ref >60)
Glucose, Bld: 134 mg/dL — ABNORMAL HIGH (ref 70–99)
Potassium: 3.6 mmol/L (ref 3.5–5.1)
Sodium: 139 mmol/L (ref 135–145)
Total Bilirubin: 0.3 mg/dL (ref 0.3–1.2)
Total Protein: 6.7 g/dL (ref 6.5–8.1)

## 2023-02-12 MED ORDER — SODIUM CHLORIDE 0.9 % IV SOLN
500.0000 mg/m2 | Freq: Once | INTRAVENOUS | Status: AC
  Administered 2023-02-12: 1000 mg via INTRAVENOUS
  Filled 2023-02-12: qty 40

## 2023-02-12 MED ORDER — SODIUM CHLORIDE 0.9 % IV SOLN
200.0000 mg | Freq: Once | INTRAVENOUS | Status: AC
  Administered 2023-02-12: 200 mg via INTRAVENOUS
  Filled 2023-02-12: qty 200

## 2023-02-12 MED ORDER — PROCHLORPERAZINE MALEATE 10 MG PO TABS
10.0000 mg | ORAL_TABLET | Freq: Once | ORAL | Status: AC
  Administered 2023-02-12: 10 mg via ORAL
  Filled 2023-02-12: qty 1

## 2023-02-12 MED ORDER — SODIUM CHLORIDE 0.9 % IV SOLN: Freq: Once | INTRAVENOUS | Status: AC

## 2023-02-12 NOTE — Progress Notes (Signed)
[facility] [phone]   [phone]  OFFICE PROGRESS NOTE  Bradley Masters, FNP [address] [place] [address]  DIAGNOSIS: Stage IV (T1b, N3, M1C) non-small cell lung cancer, favoring adenocarcinoma presented with right upper lobe lung nodule in addition to right hilar, subcarinal and bilateral mediastinal as well as supraclavicular lymphadenopathy in addition to bone and brain metastasis diagnosed in June 2022.     PD-L1 expression 80%.     Molecular Studies:  Biomarker Findings Microsatellite status - MS-Stable Tumor Mutational Burden - 6 Muts/Mb Genomic Findings For a complete list of the genes assayed, please refer to the Appendix. KRAS G12C, amplification ATM S470* CCND1 amplification - equivocal? HGF amplification - equivocal? MYC amplification - equivocal? FGF19 amplification - equivocal? FGF3 amplification - equivocal? FGF4 amplification - equivocal? NFKBIA amplification NKX2-1 amplification RAD21 amplification - equivocal? [id number]*26 TERT promoter -124C>T TP53 rearrangement exon 9 7 Disease relevant genes with no reportable alterations: ALK, BRAF, EGFR, ERBB2, MET, RET, ROS1   PRIOR THERAPY: SRS to the metastatic brain lesions under the care of Dr. Kathrynn Running.  Last treatment on 05/04/2021.   CURRENT THERAPY: Palliative systemic chemotherapy with carboplatin for an AUC 5, Alimta 500 mg/m2 and, Keytruda 200 mg IV every 3 weeks.  First dose expected on 05/08/2021.  Status post 30 cycles.  Starting from cycle #5 he is on maintenance treatment with Alimta and Keytruda every 3 weeks.  INTERVAL HISTORY: Bradley Goodman 67 y.o. male returns to the clinic today for follow-up visit accompanied by his wife.  The patient is feeling much better today with no concerning complaints.  He has more energy in the last few days.  He denied having any current chest pain, shortness of breath, cough or hemoptysis.  He has no nausea,  vomiting, diarrhea or constipation.  He has no headache or visual changes.  He had repeat CT scan of the chest, abdomen and pelvis performed recently and he is here for evaluation and discussion of his scan results.  MEDICAL HISTORY: Past Medical History:  Diagnosis Date   Cervical spondylolysis    Essential hypertension    GERD (gastroesophageal reflux disease)    History of kidney stones    History of migraine    Hyperlipidemia    Hypertension    PONV (postoperative nausea and vomiting)    Type 2 diabetes mellitus (HCC)     ALLERGIES:  has No Known Allergies.  MEDICATIONS:  Current Outpatient Medications  Medication Sig Dispense Refill   cholecalciferol (VITAMIN D) 1000 UNITS tablet Take 1,000 Units by mouth every evening.     Continuous Glucose Sensor (FREESTYLE LIBRE 3 SENSOR) MISC Place 1 sensor on the skin every 14 days. Use to check glucose continuously 2 each 2   dexamethasone (DECADRON) 1 MG tablet Take 1 tablet (1 mg total) by mouth daily with breakfast. 90 tablet 1   docusate sodium (COLACE) 100 MG capsule Take 100 mg by mouth daily.     folic acid (FOLVITE) 1 MG tablet Take 1 tablet by mouth daily. 90 tablet 4   Krill Oil 300 MG CAPS Take 300 mg by mouth every evening.     levothyroxine (SYNTHROID) 75 MCG tablet Take 1 tablet (75 mcg total) by mouth daily before breakfast. 90 tablet 1   loratadine (CLARITIN) 10 MG tablet Take 10 mg by mouth every evening.      losartan (COZAAR) 50 MG tablet Take 1 and 1/2 tablets (75 mg total)  by mouth daily. 135 tablet 1   mirtazapine (REMERON) 15 MG tablet Take 1 tablet by mouth at bedtime. 90 tablet 0   Multiple Vitamins-Minerals (MULTIVITAMIN GUMMIES ADULT PO) Take 2 each by mouth daily.     pantoprazole (PROTONIX) 40 MG tablet Take 1 tablet by mouth every evening. 90 tablet 0   PRESCRIPTION MEDICATION Testosterone injection     prochlorperazine (COMPAZINE) 10 MG tablet Take 1 tablet (10 mg total) by mouth every 6 (six) hours as  needed for nausea or vomiting. 30 tablet 0   rosuvastatin (CRESTOR) 20 MG tablet Take 1 tablet (20 mg total) by mouth daily. 90 tablet 1   Semaglutide,0.25 or 0.5MG /DOS, (OZEMPIC, 0.25 OR 0.5 MG/DOSE,) 2 MG/3ML SOPN Inject 0.5 mg into the skin once a week. 9 mL 1   SYRINGE-NEEDLE, DISP, 3 ML 21G X 1-1/2" 3 ML MISC Use to inject testosterone every week 100 each 2   testosterone cypionate (DEPOTESTOTERONE CYPIONATE) 100 MG/ML injection Take 50mg  ( 0.51ml) and 100mg  ( 1ml) into muscle alternatively weekly. 10 mL 0   venlafaxine XR (EFFEXOR-XR) 75 MG 24 hr capsule Take 1 capsule (75 mg total) by mouth daily with breakfast. 90 capsule 1   No current facility-administered medications for this visit.    SURGICAL HISTORY:  Past Surgical History:  Procedure Laterality Date   Bilateral inguinal hernia repair     BRONCHIAL NEEDLE ASPIRATION BIOPSY  04/05/2021   Procedure: BRONCHIAL NEEDLE ASPIRATION BIOPSIES;  Surgeon: Josephine Igo, DO;  Location: [facility];  Service: Pulmonary;;   COLONOSCOPY  01/23/2012   Procedure: COLONOSCOPY;  Surgeon: Corbin Ade, MD;  Location: [facility];  Service: Endoscopy;  Laterality: N/A;  9:30 AM   COLONOSCOPY N/A 10/11/2015   Procedure: COLONOSCOPY;  Surgeon: Corbin Ade, MD;  Location: [facility];  Service: Endoscopy;  Laterality: N/A;  830   COLONOSCOPY N/A 10/14/2019   Procedure: COLONOSCOPY;  Surgeon: Corbin Ade, MD;  Location: [facility];  Service: Endoscopy;  Laterality: N/A;  1:45   POLYPECTOMY  10/14/2019   Procedure: POLYPECTOMY;  Surgeon: Corbin Ade, MD;  Location: [facility];  Service: Endoscopy;;  ascending colon, descending colon   VIDEO BRONCHOSCOPY WITH ENDOBRONCHIAL ULTRASOUND N/A 04/05/2021   Procedure: VIDEO BRONCHOSCOPY WITH ENDOBRONCHIAL ULTRASOUND;  Surgeon: Josephine Igo, DO;  Location: [facility];  Service: Pulmonary;  Laterality: N/A;    REVIEW OF SYSTEMS:  Constitutional: negative Eyes: negative Ears, nose,  mouth, throat, and face: negative Respiratory: negative Cardiovascular: negative Gastrointestinal: negative Genitourinary:negative Integument/breast: negative Hematologic/lymphatic: negative Musculoskeletal:negative Neurological: negative Behavioral/Psych: negative Endocrine: negative Allergic/Immunologic: negative   PHYSICAL EXAMINATION: General appearance: alert, cooperative, appears stated age, and no distress Head: Normocephalic, without obvious abnormality, atraumatic Neck: no adenopathy, no JVD, supple, symmetrical, trachea midline, and thyroid not enlarged, symmetric, no tenderness/mass/nodules Lymph nodes: Cervical, supraclavicular, and axillary nodes normal. Resp: clear to auscultation bilaterally Back: symmetric, no curvature. ROM normal. No CVA tenderness. Cardio: regular rate and rhythm, S1, S2 normal, no murmur, click, rub or gallop GI: soft, non-tender; bowel sounds normal; no masses,  no organomegaly Extremities: extremities normal, atraumatic, no cyanosis or edema Neurologic: Alert and oriented X 3, normal strength and tone. Normal symmetric reflexes. Normal coordination and gait  ECOG PERFORMANCE STATUS: 1 - Symptomatic but completely ambulatory  Blood pressure 132/79, pulse (!) 106, temperature 98.4 F (36.9 C), resp. rate 18, weight 191 lb 12.8 oz (87 kg), SpO2 100 %.  LABORATORY DATA: Lab Results  Component Value  Date   WBC 10.5 02/12/2023   HGB 12.2 (L) 02/12/2023   HCT 39.6 02/12/2023   MCV 85.7 02/12/2023   PLT 283 02/12/2023      Chemistry      Component Value Date/Time   NA 140 01/23/2023 1043   NA 144 01/10/2021 0849   K 4.0 01/23/2023 1043   CL 107 01/23/2023 1043   CO2 29 01/23/2023 1043   BUN 12 01/23/2023 1043   BUN 15 01/10/2021 0849   CREATININE 1.05 01/23/2023 1043      Component Value Date/Time   CALCIUM 9.0 01/23/2023 1043   ALKPHOS 67 01/23/2023 1043   AST 19 01/23/2023 1043   ALT 17 01/23/2023 1043   BILITOT 0.3 01/23/2023  1043       RADIOGRAPHIC STUDIES: CT Chest W Contrast  Result Date: 02/12/2023 CLINICAL DATA:  Non-small-cell lung cancer. Ongoing chemotherapy. Asymptomatic. EXAM: CT CHEST, ABDOMEN, AND PELVIS WITH CONTRAST TECHNIQUE: Multidetector CT imaging of the chest, abdomen and pelvis was performed following the standard protocol during bolus administration of intravenous contrast. RADIATION DOSE REDUCTION: This exam was performed according to the departmental dose-optimization program which includes automated exposure control, adjustment of the mA and/or kV according to patient size and/or use of iterative reconstruction technique. CONTRAST:  OMNIPAQUE IOHEXOL 300 MG/ML  SOLN COMPARISON:  10/11/2022 staging CTs. Interval CTA chest of 11/08/2022. FINDINGS: CT CHEST FINDINGS Cardiovascular: Aortic atherosclerosis. Normal heart size with minimal anterior pericardial fluid, most likely physiologic. Left main coronary artery calcification. No central pulmonary embolism, on this non-dedicated study. Mediastinum/Nodes: No supraclavicular adenopathy. No axillary adenopathy. No mediastinal or hilar adenopathy. Lungs/Pleura: No pleural fluid. Mild centrilobular emphysema. Mild biapical pleuroparenchymal scarring. Again identified are bilateral solid and non solid pulmonary nodules which are similar in size and distribution. Index right upper lobe solid 7 mm nodule is similar on 54/4. A posterior right upper lobe vague, possibly mixed attenuation 6 mm nodule is unchanged on 52/4. Left upper lobe 7 mm ground-glass nodule on 86/4 is unchanged. Musculoskeletal: No acute osseous abnormality. Tiny sclerotic lesions within bilateral ribs are similar and likely bone islands. Posterior posterior T6 lytic lesion including on sagittal image 117 is similar back to 01/17/2017, most consistent with a benign entity, likely a hemangioma. CT ABDOMEN PELVIS FINDINGS Hepatobiliary: Tiny low-density liver lesions are too small to  characterize but similar to on the prior including at up to 6 mm. Normal gallbladder, without biliary ductal dilatation. Pancreas: Normal, without mass or ductal dilatation. Spleen: Normal in size, without focal abnormality. Adrenals/Urinary Tract: Normal adrenal glands. 7 mm left renal collecting system calculus. Normal right kidney. No hydronephrosis. Normal urinary bladder. Stomach/Bowel: Normal stomach, without wall thickening. Scattered colonic diverticula. Colonic stool burden suggests constipation. Normal terminal ileum and appendix. Normal small bowel. Vascular/Lymphatic: Aortic atherosclerosis. No abdominopelvic adenopathy. Reproductive: Mild prostatomegaly. Other: No significant free fluid. No free intraperitoneal air. No evidence of omental or peritoneal disease. Musculoskeletal: No acute osseous abnormality. IMPRESSION: 1. No significant change since 10/11/2022. Small nonspecific bilateral pulmonary nodules are unchanged. 2. Aortic atherosclerosis (ICD10-I70.0), coronary artery atherosclerosis and emphysema (ICD10-J43.9). 3. Left nephrolithiasis Electronically Signed   By: Jeronimo Greaves M.D.   On: 02/12/2023 10:25   CT Abdomen Pelvis W Contrast  Result Date: 02/12/2023 CLINICAL DATA:  Non-small-cell lung cancer. Ongoing chemotherapy. Asymptomatic. EXAM: CT CHEST, ABDOMEN, AND PELVIS WITH CONTRAST TECHNIQUE: Multidetector CT imaging of the chest, abdomen and pelvis was performed following the standard protocol during bolus administration of intravenous contrast. RADIATION DOSE REDUCTION:  This exam was performed according to the departmental dose-optimization program which includes automated exposure control, adjustment of the mA and/or kV according to patient size and/or use of iterative reconstruction technique. CONTRAST:  OMNIPAQUE IOHEXOL 300 MG/ML  SOLN COMPARISON:  10/11/2022 staging CTs. Interval CTA chest of 11/08/2022. FINDINGS: CT CHEST FINDINGS Cardiovascular: Aortic atherosclerosis.  Normal heart size with minimal anterior pericardial fluid, most likely physiologic. Left main coronary artery calcification. No central pulmonary embolism, on this non-dedicated study. Mediastinum/Nodes: No supraclavicular adenopathy. No axillary adenopathy. No mediastinal or hilar adenopathy. Lungs/Pleura: No pleural fluid. Mild centrilobular emphysema. Mild biapical pleuroparenchymal scarring. Again identified are bilateral solid and non solid pulmonary nodules which are similar in size and distribution. Index right upper lobe solid 7 mm nodule is similar on 54/4. A posterior right upper lobe vague, possibly mixed attenuation 6 mm nodule is unchanged on 52/4. Left upper lobe 7 mm ground-glass nodule on 86/4 is unchanged. Musculoskeletal: No acute osseous abnormality. Tiny sclerotic lesions within bilateral ribs are similar and likely bone islands. Posterior posterior T6 lytic lesion including on sagittal image 117 is similar back to 01/17/2017, most consistent with a benign entity, likely a hemangioma. CT ABDOMEN PELVIS FINDINGS Hepatobiliary: Tiny low-density liver lesions are too small to characterize but similar to on the prior including at up to 6 mm. Normal gallbladder, without biliary ductal dilatation. Pancreas: Normal, without mass or ductal dilatation. Spleen: Normal in size, without focal abnormality. Adrenals/Urinary Tract: Normal adrenal glands. 7 mm left renal collecting system calculus. Normal right kidney. No hydronephrosis. Normal urinary bladder. Stomach/Bowel: Normal stomach, without wall thickening. Scattered colonic diverticula. Colonic stool burden suggests constipation. Normal terminal ileum and appendix. Normal small bowel. Vascular/Lymphatic: Aortic atherosclerosis. No abdominopelvic adenopathy. Reproductive: Mild prostatomegaly. Other: No significant free fluid. No free intraperitoneal air. No evidence of omental or peritoneal disease. Musculoskeletal: No acute osseous abnormality.  IMPRESSION: 1. No significant change since 10/11/2022. Small nonspecific bilateral pulmonary nodules are unchanged. 2. Aortic atherosclerosis (ICD10-I70.0), coronary artery atherosclerosis and emphysema (ICD10-J43.9). 3. Left nephrolithiasis Electronically Signed   By: Jeronimo Greaves M.D.   On: 02/12/2023 10:25    ASSESSMENT AND PLAN: This is a very pleasant 67 years old white male recently diagnosed with stage IV (T1b, N3, M1 C) non-small cell lung cancer favoring adenocarcinoma presented with right upper lobe lung nodule in addition to right hilar, subcarinal and bilateral mediastinal as well as supraclavicular lymphadenopathy.  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  His PD-L1 expression is 80% and his molecular studies showed KRAS G12C mutation. The patient underwent SRS to metastatic brain lesion under the care of Dr. Kathrynn Running and he is currently undergoing systemic chemotherapy with carboplatin for AUC of 5, Alimta 500 Mg/M2 and Keytruda 200 Mg IV every 3 weeks status post 30 cycles.  Starting from cycle #5 the patient will be treated with maintenance treatment with Alimta and Keytruda every 3 weeks. The patient has been tolerating this treatment well with no concerning adverse effects. The patient had repeat CT scan of the chest, abdomen and pelvis performed recently.  I personally and independently reviewed the scan and discussed the result with the patient and his wife. His scan showed no concerning findings for disease progression. I recommended for the patient to continue his current treatment with maintenance Alimta and Keytruda and he will proceed with cycle #31 today as planned. I will see him back for follow-up visit in 3 weeks for evaluation before the next cycle of  his treatment. He was advised to call immediately if he has any other concerning symptoms in the interval. The patient voices understanding of current disease status and treatment options and is in  agreement with the current care plan.  All questions were answered. The patient knows to call the clinic with any problems, questions or concerns. We can certainly see the patient much sooner if necessary. The total time spent in the appointment was 30 minutes.  Disclaimer: This note was dictated with voice recognition software. Similar sounding words can inadvertently be transcribed and may not be corrected upon review.

## 2023-02-12 NOTE — Progress Notes (Signed)
Patient seen by Dr. Gypsy Balsam are NOT within treatment parameters.Per Dr. Arbutus Ped it is okay to treat pt today with Keytruda and Alimta and heart rate of 106  Labs reviewed: and are within treatment parameters.  Per physician team, patient is ready for treatment and there are NO modifications to the treatment plan.

## 2023-02-12 NOTE — Patient Instructions (Signed)
[facility]  Discharge Instructions: Thank you for choosing [facility] to provide your oncology and hematology care.   If you have a lab appointment with the Cancer Center, please go directly to the Cancer Center and check in at the registration area.   Wear comfortable clothing and clothing appropriate for easy access to any Portacath or PICC line.   We strive to give you quality time with your provider. You may need to reschedule your appointment if you arrive late (15 or more minutes).  Arriving late affects you and other patients whose appointments are after yours.  Also, if you miss three or more appointments without notifying the office, you may be dismissed from the clinic at the provider's discretion.      For prescription refill requests, have your pharmacy contact our office and allow 72 hours for refills to be completed.    Today you received the following chemotherapy and/or immunotherapy agents: Keytruda, Alimta.       To help prevent nausea and vomiting after your treatment, we encourage you to take your nausea medication as directed.  BELOW ARE SYMPTOMS THAT SHOULD BE REPORTED IMMEDIATELY: *FEVER GREATER THAN 100.4 F (38 C) OR HIGHER *CHILLS OR SWEATING *NAUSEA AND VOMITING THAT IS NOT CONTROLLED WITH YOUR NAUSEA MEDICATION *UNUSUAL SHORTNESS OF BREATH *UNUSUAL BRUISING OR BLEEDING *URINARY PROBLEMS (pain or burning when urinating, or frequent urination) *BOWEL PROBLEMS (unusual diarrhea, constipation, pain near the anus) TENDERNESS IN MOUTH AND THROAT WITH OR WITHOUT PRESENCE OF ULCERS (sore throat, sores in mouth, or a toothache) UNUSUAL RASH, SWELLING OR PAIN  UNUSUAL VAGINAL DISCHARGE OR ITCHING   Items with * indicate a potential emergency and should be followed up as soon as possible or go to the Emergency Department if any problems should occur.  Please show the CHEMOTHERAPY ALERT CARD or IMMUNOTHERAPY ALERT CARD  at check-in to the Emergency Department and triage nurse.  Should you have questions after your visit or need to cancel or reschedule your appointment, please contact [facility]  Dept: [phone]  and follow the prompts.  Office hours are 8:00 a.m. to 4:30 p.m. Monday - Friday. Please note that voicemails left after 4:00 p.m. may not be returned until the following business day.  We are closed weekends and major holidays. You have access to a nurse at all times for urgent questions. Please call the main number to the clinic Dept: [phone] and follow the prompts.   For any non-urgent questions, you may also contact your provider using MyChart. We now offer e-Visits for anyone 18 and older to request care online for non-urgent symptoms. For details visit [email or web address].   Also download the MyChart app! Go to the app store, search "MyChart", open the app, select [facility], and log in with your MyChart username and password.   

## 2023-02-13 ENCOUNTER — Ambulatory Visit: Payer: Commercial Managed Care - PPO

## 2023-02-13 ENCOUNTER — Other Ambulatory Visit: Payer: Commercial Managed Care - PPO

## 2023-02-13 ENCOUNTER — Ambulatory Visit: Payer: Commercial Managed Care - PPO | Admitting: Internal Medicine

## 2023-02-14 ENCOUNTER — Other Ambulatory Visit: Payer: Self-pay

## 2023-02-14 ENCOUNTER — Other Ambulatory Visit (HOSPITAL_COMMUNITY): Payer: Self-pay

## 2023-02-14 LAB — T4: T4, Total: 7.1 ug/dL (ref 4.5–12.0)

## 2023-02-18 ENCOUNTER — Encounter: Payer: Self-pay | Admitting: Internal Medicine

## 2023-02-22 ENCOUNTER — Ambulatory Visit
Admission: RE | Admit: 2023-02-22 | Discharge: 2023-02-22 | Disposition: A | Discharge status: Home / Self Care | Payer: Commercial Managed Care - PPO | Source: Ambulatory Visit | Attending: Internal Medicine | Admitting: Internal Medicine

## 2023-02-22 DIAGNOSIS — C7931 Secondary malignant neoplasm of brain: Secondary | ICD-10-CM

## 2023-02-22 DIAGNOSIS — C716 Malignant neoplasm of cerebellum: Secondary | ICD-10-CM | POA: Diagnosis not present

## 2023-02-22 MED ORDER — GADOPICLENOL 0.5 MMOL/ML IV SOLN
8.0000 mL | Freq: Once | INTRAVENOUS | Status: AC | PRN
  Administered 2023-02-22: 8 mL via INTRAVENOUS

## 2023-02-25 ENCOUNTER — Inpatient Hospital Stay: Payer: Commercial Managed Care - PPO

## 2023-02-26 ENCOUNTER — Inpatient Hospital Stay (HOSPITAL_BASED_OUTPATIENT_CLINIC_OR_DEPARTMENT_OTHER): Payer: Commercial Managed Care - PPO | Admitting: Internal Medicine

## 2023-02-26 VITALS — BP 148/96 | HR 93 | Temp 98.2°F | Resp 15 | Ht 71.5 in | Wt 191.0 lb

## 2023-02-26 DIAGNOSIS — C7931 Secondary malignant neoplasm of brain: Secondary | ICD-10-CM

## 2023-02-26 DIAGNOSIS — Z5112 Encounter for antineoplastic immunotherapy: Secondary | ICD-10-CM | POA: Diagnosis not present

## 2023-02-26 NOTE — Progress Notes (Signed)
[facility] [address]  [place], [place] [address] [phone]   Interval Evaluation  Date of Service: 02/26/23 Patient Name: Bradley Goodman Patient MRN: [id number] Patient DOB: Dec 12, 1955 Provider: Henreitta Leber, MD  Identifying Statement:  Bradley Goodman is a 67 y.o. male with Malignant neoplasm metastatic to brain [facility])   Primary Cancer:  Oncologic History: Oncology History  Adenocarcinoma of right lung, stage 4 (HCC)  04/25/2021 Initial Diagnosis   Adenocarcinoma of right lung, stage 4 (HCC)   04/25/2021 Cancer Staging   Staging form: Lung, AJCC 8th Edition - Clinical: Stage IVB (cT1b, cN3, cM1c) - Signed by Si Gaul, MD on 04/25/2021   05/08/2021 -  Chemotherapy   Patient is on Treatment Plan : LUNG Carboplatin (5) + Pemetrexed (500) + Pembrolizumab (200) D1 q21d Induction x 4 cycles / Maintenance Pemetrexed (500) + Pembrolizumab (200) D1 q21d      CNS Oncologic History 05/04/21: Radiosurgery to 4 brain metastases, 5 fractions to pituitary lesion  Interval History: Bradley Goodman presents today for follow up after recent MRI brain.  No new or progressive changes.  No issues with visual impairment as prior.  Remains active with his garden, golfing.  Denies seizures and headaches.  H+P (05/23/21) Patient presented to medical attention initially on 03/27/21 with new onset left eyelid drooping.  CNS imaging was obtained, which demonstrated multiple enhancing lesions consistent with brain metastases.  Systemic workup demonstrated lung cancer, which was confirmed through endobronchial biopsy.  He underwent single fraction radiosurgery to brain lesions, and fractionated radiosurgery to pituitary lesion.  Following radiation, eyelid drooping improved, but he developed "tunnel vision" visual deficits, described as "like looking through plastic".  This has been more or less static over the past week or so, though it did improve  transiently after dosing decadron with chemotherapy on 05/08/21.   Medications: Current Outpatient Medications on File Prior to Visit  Medication Sig Dispense Refill   cholecalciferol (VITAMIN D) 1000 UNITS tablet Take 1,000 Units by mouth every evening.     Continuous Glucose Sensor (FREESTYLE LIBRE 3 SENSOR) MISC Place 1 sensor on the skin every 14 days. Use to check glucose continuously 2 each 2   dexamethasone (DECADRON) 1 MG tablet Take 1 tablet (1 mg total) by mouth daily with breakfast. 90 tablet 1   docusate sodium (COLACE) 100 MG capsule Take 100 mg by mouth daily.     folic acid (FOLVITE) 1 MG tablet Take 1 tablet by mouth daily. 90 tablet 4   Krill Oil 300 MG CAPS Take 300 mg by mouth every evening.     levothyroxine (SYNTHROID) 75 MCG tablet Take 1 tablet (75 mcg total) by mouth daily before breakfast. 90 tablet 1   loratadine (CLARITIN) 10 MG tablet Take 10 mg by mouth every evening.      losartan (COZAAR) 50 MG tablet Take 1 and 1/2 tablets (75 mg total) by mouth daily. 135 tablet 1   mirtazapine (REMERON) 15 MG tablet Take 1 tablet by mouth at bedtime. 90 tablet 0   Multiple Vitamins-Minerals (MULTIVITAMIN GUMMIES ADULT PO) Take 2 each by mouth daily.     pantoprazole (PROTONIX) 40 MG tablet Take 1 tablet by mouth every evening. 90 tablet 0   PRESCRIPTION MEDICATION Testosterone injection     prochlorperazine (COMPAZINE) 10 MG tablet Take 1 tablet (10 mg total) by mouth every 6 (six) hours as needed for nausea or vomiting. 30 tablet 0   rosuvastatin (  CRESTOR) 20 MG tablet Take 1 tablet (20 mg total) by mouth daily. 90 tablet 1   Semaglutide,0.25 or 0.5MG /DOS, (OZEMPIC, 0.25 OR 0.5 MG/DOSE,) 2 MG/3ML SOPN Inject 0.5 mg into the skin once a week. 9 mL 1   SYRINGE-NEEDLE, DISP, 3 ML 21G X 1-1/2" 3 ML MISC Use to inject testosterone every week 100 each 2   testosterone cypionate (DEPOTESTOTERONE CYPIONATE) 100 MG/ML injection Take 50mg  ( 0.56ml) and 100mg  ( 1ml) into muscle  alternatively weekly. 10 mL 0   venlafaxine XR (EFFEXOR-XR) 75 MG 24 hr capsule Take 1 capsule (75 mg total) by mouth daily with breakfast. 90 capsule 1   No current facility-administered medications on file prior to visit.    Allergies: No Known Allergies Past Medical History:  Past Medical History:  Diagnosis Date   Cervical spondylolysis    Essential hypertension    GERD (gastroesophageal reflux disease)    History of kidney stones    History of migraine    Hyperlipidemia    Hypertension    PONV (postoperative nausea and vomiting)    Type 2 diabetes mellitus (HCC)    Past Surgical History:  Past Surgical History:  Procedure Laterality Date   Bilateral inguinal hernia repair     BRONCHIAL NEEDLE ASPIRATION BIOPSY  04/05/2021   Procedure: BRONCHIAL NEEDLE ASPIRATION BIOPSIES;  Surgeon: Josephine Igo, DO;  Location: [facility];  Service: Pulmonary;;   COLONOSCOPY  01/23/2012   Procedure: COLONOSCOPY;  Surgeon: Corbin Ade, MD;  Location: [facility];  Service: Endoscopy;  Laterality: N/A;  9:30 AM   COLONOSCOPY N/A 10/11/2015   Procedure: COLONOSCOPY;  Surgeon: Corbin Ade, MD;  Location: [facility];  Service: Endoscopy;  Laterality: N/A;  830   COLONOSCOPY N/A 10/14/2019   Procedure: COLONOSCOPY;  Surgeon: Corbin Ade, MD;  Location: [facility];  Service: Endoscopy;  Laterality: N/A;  1:45   POLYPECTOMY  10/14/2019   Procedure: POLYPECTOMY;  Surgeon: Corbin Ade, MD;  Location: [facility];  Service: Endoscopy;;  ascending colon, descending colon   VIDEO BRONCHOSCOPY WITH ENDOBRONCHIAL ULTRASOUND N/A 04/05/2021   Procedure: VIDEO BRONCHOSCOPY WITH ENDOBRONCHIAL ULTRASOUND;  Surgeon: Josephine Igo, DO;  Location: [facility];  Service: Pulmonary;  Laterality: N/A;   Social History:  Social History   Socioeconomic History   Marital status: Married    Spouse name: Not on file   Number of children: Not on file   Years of education: Not on file    Highest education level: Not on file  Occupational History   Not on file  Tobacco Use   Smoking status: Every Day    Packs/day: 0.50    Years: 30.00    Additional pack years: 0.00    Total pack years: 15.00    Types: Cigarettes    Start date: 10/30/1973   Smokeless tobacco: Never  Vaping Use   Vaping Use: Never used  Substance and Sexual Activity   Alcohol use: Not Currently    Comment: One drink every 6 months.   Drug use: No   Sexual activity: Yes  Other Topics Concern   Not on file  Social History Narrative   Not on file   Social Determinants of Health   Financial Resource Strain: Not on file  Food Insecurity: No Food Insecurity (12/12/2022)   Hunger Vital Sign    Worried About Running Out of Food in the Last Year: Never true    Ran Out of Food in  the Last Year: Never true  Transportation Needs: No Transportation Needs (12/12/2022)   PRAPARE - Administrator, Civil Service (Medical): No    Lack of Transportation (Non-Medical): No  Physical Activity: Not on file  Stress: Not on file  Social Connections: Not on file  Intimate Partner Violence: Not on file   Family History:  Family History  Problem Relation Age of Onset   Hypertension Mother    Diabetes Mother    Heart attack Mother    Hypertension Father    Heart attack Father    Heart attack Brother    Colon cancer Neg Hx     Review of Systems: Constitutional: Doesn't report fevers, chills or abnormal weight loss Eyes: Doesn't report blurriness of vision Ears, nose, mouth, throat, and face: Doesn't report sore throat Respiratory: Doesn't report cough, dyspnea or wheezes Cardiovascular: Doesn't report palpitation, chest discomfort  Gastrointestinal:  Doesn't report nausea, constipation, diarrhea GU: Doesn't report incontinence Skin: Doesn't report skin rashes Neurological: Per HPI Musculoskeletal: Doesn't report joint pain Behavioral/Psych: Doesn't report anxiety  Physical Exam: Vitals:    02/26/23 1032  BP: (!) 148/96  Pulse: 93  Resp: 15  Temp: 98.2 F (36.8 C)  SpO2: 99%    KPS: 90. General: Alert, cooperative, pleasant, in no acute distress Head: Normal EENT: No conjunctival injection or scleral icterus.  Lungs: Resp effort normal Cardiac: Regular rate Abdomen: Non-distended abdomen Skin: No rashes cyanosis or petechiae. Extremities: No clubbing or edema  Neurologic Exam: Mental Status: Awake, alert, attentive to examiner. Oriented to self and environment. Language is fluent with intact comprehension.  Cranial Nerves: Visual acuity is grossly normal. Visual fields are full. Extra-ocular movements intact. No ptosis. Face is symmetric Motor: Tone and bulk are normal. Power is full in both arms and legs. Reflexes are symmetric, no pathologic reflexes present.  Sensory: Intact to light touch Gait: Normal.   Labs: I have reviewed the data as listed    Component Value Date/Time   NA 139 02/12/2023 1042   NA 144 01/10/2021 0849   K 3.6 02/12/2023 1042   CL 107 02/12/2023 1042   CO2 24 02/12/2023 1042   GLUCOSE 134 (H) 02/12/2023 1042   BUN 10 02/12/2023 1042   BUN 15 01/10/2021 0849   CREATININE 0.99 02/12/2023 1042   CALCIUM 8.7 (L) 02/12/2023 1042   PROT 6.7 02/12/2023 1042   PROT 6.3 01/10/2021 0849   ALBUMIN 3.6 02/12/2023 1042   ALBUMIN 4.2 01/10/2021 0849   AST 21 02/12/2023 1042   ALT 21 02/12/2023 1042   ALKPHOS 60 02/12/2023 1042   BILITOT 0.3 02/12/2023 1042   GFRNONAA >60 02/12/2023 1042   GFRAA 82 07/14/2020 0945   Lab Results  Component Value Date   WBC 10.5 02/12/2023   NEUTROABS 8.2 (H) 02/12/2023   HGB 12.2 (L) 02/12/2023   HCT 39.6 02/12/2023   MCV 85.7 02/12/2023   PLT 283 02/12/2023   Imaging:  [facility] Clinician Interpretation: I have personally reviewed the CNS images as listed.  My interpretation, in the context of the patient's clinical presentation, is stable disease  CT Chest W Contrast  Result Date:  02/12/2023 CLINICAL DATA:  Non-small-cell lung cancer. Ongoing chemotherapy. Asymptomatic. EXAM: CT CHEST, ABDOMEN, AND PELVIS WITH CONTRAST TECHNIQUE: Multidetector CT imaging of the chest, abdomen and pelvis was performed following the standard protocol during bolus administration of intravenous contrast. RADIATION DOSE REDUCTION: This exam was performed according to the departmental dose-optimization program which includes automated exposure control,  adjustment of the mA and/or kV according to patient size and/or use of iterative reconstruction technique. CONTRAST:  OMNIPAQUE IOHEXOL 300 MG/ML  SOLN COMPARISON:  10/11/2022 staging CTs. Interval CTA chest of 11/08/2022. FINDINGS: CT CHEST FINDINGS Cardiovascular: Aortic atherosclerosis. Normal heart size with minimal anterior pericardial fluid, most likely physiologic. Left main coronary artery calcification. No central pulmonary embolism, on this non-dedicated study. Mediastinum/Nodes: No supraclavicular adenopathy. No axillary adenopathy. No mediastinal or hilar adenopathy. Lungs/Pleura: No pleural fluid. Mild centrilobular emphysema. Mild biapical pleuroparenchymal scarring. Again identified are bilateral solid and non solid pulmonary nodules which are similar in size and distribution. Index right upper lobe solid 7 mm nodule is similar on 54/4. A posterior right upper lobe vague, possibly mixed attenuation 6 mm nodule is unchanged on 52/4. Left upper lobe 7 mm ground-glass nodule on 86/4 is unchanged. Musculoskeletal: No acute osseous abnormality. Tiny sclerotic lesions within bilateral ribs are similar and likely bone islands. Posterior posterior T6 lytic lesion including on sagittal image 117 is similar back to 01/17/2017, most consistent with a benign entity, likely a hemangioma. CT ABDOMEN PELVIS FINDINGS Hepatobiliary: Tiny low-density liver lesions are too small to characterize but similar to on the prior including at up to 6 mm. Normal gallbladder,  without biliary ductal dilatation. Pancreas: Normal, without mass or ductal dilatation. Spleen: Normal in size, without focal abnormality. Adrenals/Urinary Tract: Normal adrenal glands. 7 mm left renal collecting system calculus. Normal right kidney. No hydronephrosis. Normal urinary bladder. Stomach/Bowel: Normal stomach, without wall thickening. Scattered colonic diverticula. Colonic stool burden suggests constipation. Normal terminal ileum and appendix. Normal small bowel. Vascular/Lymphatic: Aortic atherosclerosis. No abdominopelvic adenopathy. Reproductive: Mild prostatomegaly. Other: No significant free fluid. No free intraperitoneal air. No evidence of omental or peritoneal disease. Musculoskeletal: No acute osseous abnormality. IMPRESSION: 1. No significant change since 10/11/2022. Small nonspecific bilateral pulmonary nodules are unchanged. 2. Aortic atherosclerosis (ICD10-I70.0), coronary artery atherosclerosis and emphysema (ICD10-J43.9). 3. Left nephrolithiasis Electronically Signed   By: Jeronimo Greaves M.D.   On: 02/12/2023 10:25   CT Abdomen Pelvis W Contrast  Result Date: 02/12/2023 CLINICAL DATA:  Non-small-cell lung cancer. Ongoing chemotherapy. Asymptomatic. EXAM: CT CHEST, ABDOMEN, AND PELVIS WITH CONTRAST TECHNIQUE: Multidetector CT imaging of the chest, abdomen and pelvis was performed following the standard protocol during bolus administration of intravenous contrast. RADIATION DOSE REDUCTION: This exam was performed according to the departmental dose-optimization program which includes automated exposure control, adjustment of the mA and/or kV according to patient size and/or use of iterative reconstruction technique. CONTRAST:  OMNIPAQUE IOHEXOL 300 MG/ML  SOLN COMPARISON:  10/11/2022 staging CTs. Interval CTA chest of 11/08/2022. FINDINGS: CT CHEST FINDINGS Cardiovascular: Aortic atherosclerosis. Normal heart size with minimal anterior pericardial fluid, most likely physiologic. Left  main coronary artery calcification. No central pulmonary embolism, on this non-dedicated study. Mediastinum/Nodes: No supraclavicular adenopathy. No axillary adenopathy. No mediastinal or hilar adenopathy. Lungs/Pleura: No pleural fluid. Mild centrilobular emphysema. Mild biapical pleuroparenchymal scarring. Again identified are bilateral solid and non solid pulmonary nodules which are similar in size and distribution. Index right upper lobe solid 7 mm nodule is similar on 54/4. A posterior right upper lobe vague, possibly mixed attenuation 6 mm nodule is unchanged on 52/4. Left upper lobe 7 mm ground-glass nodule on 86/4 is unchanged. Musculoskeletal: No acute osseous abnormality. Tiny sclerotic lesions within bilateral ribs are similar and likely bone islands. Posterior posterior T6 lytic lesion including on sagittal image 117 is similar back to 01/17/2017, most consistent with a benign entity, likely a  hemangioma. CT ABDOMEN PELVIS FINDINGS Hepatobiliary: Tiny low-density liver lesions are too small to characterize but similar to on the prior including at up to 6 mm. Normal gallbladder, without biliary ductal dilatation. Pancreas: Normal, without mass or ductal dilatation. Spleen: Normal in size, without focal abnormality. Adrenals/Urinary Tract: Normal adrenal glands. 7 mm left renal collecting system calculus. Normal right kidney. No hydronephrosis. Normal urinary bladder. Stomach/Bowel: Normal stomach, without wall thickening. Scattered colonic diverticula. Colonic stool burden suggests constipation. Normal terminal ileum and appendix. Normal small bowel. Vascular/Lymphatic: Aortic atherosclerosis. No abdominopelvic adenopathy. Reproductive: Mild prostatomegaly. Other: No significant free fluid. No free intraperitoneal air. No evidence of omental or peritoneal disease. Musculoskeletal: No acute osseous abnormality. IMPRESSION: 1. No significant change since 10/11/2022. Small nonspecific bilateral pulmonary  nodules are unchanged. 2. Aortic atherosclerosis (ICD10-I70.0), coronary artery atherosclerosis and emphysema (ICD10-J43.9). 3. Left nephrolithiasis Electronically Signed   By: Jeronimo Greaves M.D.   On: 02/12/2023 10:25     Assessment/Plan Malignant neoplasm metastatic to brain [facility])  Bradley Goodman is clinically and radiographically stable today.  MRI brain is still pending offical read.  For pan-hypopit, will continue to follow with Dr. Fransico Him.    He will con't to undergo chemotherapy with Dr. Arbutus Ped.  We appreciate the opportunity to participate in the care of Bradley Goodman.   We ask that Bradley Goodman return to clinic in 6 months following next brain MRI, or sooner as needed.  All questions were answered. The patient knows to call the clinic with any problems, questions or concerns. No barriers to learning were detected.  The total time spent in the encounter was 30 minutes and more than 50% was on counseling and review of test results   Henreitta Leber, MD Medical Director of Neuro-Oncology [facility] [place] 02/26/23 10:39 AM

## 2023-02-27 ENCOUNTER — Other Ambulatory Visit: Payer: Self-pay

## 2023-03-01 ENCOUNTER — Other Ambulatory Visit: Payer: Self-pay

## 2023-03-05 ENCOUNTER — Inpatient Hospital Stay: Payer: Commercial Managed Care - PPO

## 2023-03-05 ENCOUNTER — Other Ambulatory Visit: Payer: Self-pay | Admitting: Radiation Therapy

## 2023-03-05 ENCOUNTER — Inpatient Hospital Stay (HOSPITAL_BASED_OUTPATIENT_CLINIC_OR_DEPARTMENT_OTHER): Payer: Commercial Managed Care - PPO | Admitting: Internal Medicine

## 2023-03-05 ENCOUNTER — Encounter: Payer: Self-pay | Admitting: Medical Oncology

## 2023-03-05 VITALS — BP 143/86 | HR 100 | Resp 16

## 2023-03-05 DIAGNOSIS — C3491 Malignant neoplasm of unspecified part of right bronchus or lung: Secondary | ICD-10-CM

## 2023-03-05 DIAGNOSIS — Z5112 Encounter for antineoplastic immunotherapy: Secondary | ICD-10-CM | POA: Diagnosis not present

## 2023-03-05 LAB — CMP (CANCER CENTER ONLY)
ALT: 17 U/L (ref 0–44)
AST: 19 U/L (ref 15–41)
Albumin: 3.9 g/dL (ref 3.5–5.0)
Alkaline Phosphatase: 56 U/L (ref 38–126)
Anion gap: 7 (ref 5–15)
BUN: 12 mg/dL (ref 8–23)
CO2: 28 mmol/L (ref 22–32)
Calcium: 8.7 mg/dL — ABNORMAL LOW (ref 8.9–10.3)
Chloride: 106 mmol/L (ref 98–111)
Creatinine: 1.06 mg/dL (ref 0.61–1.24)
GFR, Estimated: >60 mL/min (ref >60)
Glucose, Bld: 105 mg/dL — ABNORMAL HIGH (ref 70–99)
Potassium: 3.8 mmol/L (ref 3.5–5.1)
Sodium: 141 mmol/L (ref 135–145)
Total Bilirubin: 0.3 mg/dL (ref 0.3–1.2)
Total Protein: 6.6 g/dL (ref 6.5–8.1)

## 2023-03-05 LAB — CBC WITH DIFFERENTIAL (CANCER CENTER ONLY)
Abs Immature Granulocytes: 0.05 10*3/uL (ref 0.00–0.07)
Basophils Absolute: 0.1 10*3/uL (ref 0.0–0.1)
Basophils Relative: 1 %
Eosinophils Absolute: 0.2 10*3/uL (ref 0.0–0.5)
Eosinophils Relative: 2 %
HCT: 42.2 % (ref 39.0–52.0)
Hemoglobin: 12.6 g/dL — ABNORMAL LOW (ref 13.0–17.0)
Immature Granulocytes: 1 %
Lymphocytes Relative: 20 %
Lymphs Abs: 1.9 10*3/uL (ref 0.7–4.0)
MCH: 25.7 pg — ABNORMAL LOW (ref 26.0–34.0)
MCHC: 29.9 g/dL — ABNORMAL LOW (ref 30.0–36.0)
MCV: 85.9 fL (ref 80.0–100.0)
Monocytes Absolute: 0.6 10*3/uL (ref 0.1–1.0)
Monocytes Relative: 7 %
Neutro Abs: 6.5 10*3/uL (ref 1.7–7.7)
Neutrophils Relative %: 69 %
Platelet Count: 312 10*3/uL (ref 150–400)
RBC: 4.91 MIL/uL (ref 4.22–5.81)
RDW: 20.4 % — ABNORMAL HIGH (ref 11.5–15.5)
WBC Count: 9.3 10*3/uL (ref 4.0–10.5)
nRBC: 0 % (ref 0.0–0.2)

## 2023-03-05 MED ORDER — PROCHLORPERAZINE MALEATE 10 MG PO TABS
10.0000 mg | ORAL_TABLET | Freq: Once | ORAL | Status: AC
  Administered 2023-03-05: 10 mg via ORAL
  Filled 2023-03-05: qty 1

## 2023-03-05 MED ORDER — SODIUM CHLORIDE 0.9 % IV SOLN
200.0000 mg | Freq: Once | INTRAVENOUS | Status: AC
  Administered 2023-03-05: 200 mg via INTRAVENOUS
  Filled 2023-03-05: qty 200

## 2023-03-05 MED ORDER — SODIUM CHLORIDE 0.9 % IV SOLN
500.0000 mg/m2 | Freq: Once | INTRAVENOUS | Status: AC
  Administered 2023-03-05: 1000 mg via INTRAVENOUS
  Filled 2023-03-05: qty 40

## 2023-03-05 MED ORDER — SODIUM CHLORIDE 0.9 % IV SOLN: Freq: Once | INTRAVENOUS | Status: AC

## 2023-03-05 NOTE — Patient Instructions (Signed)
[facility]  Discharge Instructions: Thank you for choosing [facility] to provide your oncology and hematology care.   If you have a lab appointment with the Cancer Center, please go directly to the Cancer Center and check in at the registration area.   Wear comfortable clothing and clothing appropriate for easy access to any Portacath or PICC line.   We strive to give you quality time with your provider. You may need to reschedule your appointment if you arrive late (15 or more minutes).  Arriving late affects you and other patients whose appointments are after yours.  Also, if you miss three or more appointments without notifying the office, you may be dismissed from the clinic at the provider's discretion.      For prescription refill requests, have your pharmacy contact our office and allow 72 hours for refills to be completed.    Today you received the following chemotherapy and/or immunotherapy agents: Keytruda/Alimta      To help prevent nausea and vomiting after your treatment, we encourage you to take your nausea medication as directed.  BELOW ARE SYMPTOMS THAT SHOULD BE REPORTED IMMEDIATELY: *FEVER GREATER THAN 100.4 F (38 C) OR HIGHER *CHILLS OR SWEATING *NAUSEA AND VOMITING THAT IS NOT CONTROLLED WITH YOUR NAUSEA MEDICATION *UNUSUAL SHORTNESS OF BREATH *UNUSUAL BRUISING OR BLEEDING *URINARY PROBLEMS (pain or burning when urinating, or frequent urination) *BOWEL PROBLEMS (unusual diarrhea, constipation, pain near the anus) TENDERNESS IN MOUTH AND THROAT WITH OR WITHOUT PRESENCE OF ULCERS (sore throat, sores in mouth, or a toothache) UNUSUAL RASH, SWELLING OR PAIN  UNUSUAL VAGINAL DISCHARGE OR ITCHING   Items with * indicate a potential emergency and should be followed up as soon as possible or go to the Emergency Department if any problems should occur.  Please show the CHEMOTHERAPY ALERT CARD or IMMUNOTHERAPY ALERT CARD at  check-in to the Emergency Department and triage nurse.  Should you have questions after your visit or need to cancel or reschedule your appointment, please contact [facility]  Dept: [phone]  and follow the prompts.  Office hours are 8:00 a.m. to 4:30 p.m. Monday - Friday. Please note that voicemails left after 4:00 p.m. may not be returned until the following business day.  We are closed weekends and major holidays. You have access to a nurse at all times for urgent questions. Please call the main number to the clinic Dept: [phone] and follow the prompts.   For any non-urgent questions, you may also contact your provider using MyChart. We now offer e-Visits for anyone 18 and older to request care online for non-urgent symptoms. For details visit [email or web address].   Also download the MyChart app! Go to the app store, search "MyChart", open the app, select [facility], and log in with your MyChart username and password.   

## 2023-03-05 NOTE — Progress Notes (Signed)
[facility] Telephone:([phone]   Fax:([phone]) [phone]  OFFICE PROGRESS NOTE  [name], FNP [address] [place] [address]  DIAGNOSIS: Stage IV (T1b, N3, M1C) non-small cell lung cancer, favoring adenocarcinoma presented with right upper lobe lung nodule in addition to right hilar, subcarinal and bilateral mediastinal as well as supraclavicular lymphadenopathy in addition to bone and brain metastasis diagnosed in June 2022.     PD-L1 expression 80%.     Molecular Studies:  Biomarker Findings Microsatellite status - MS-Stable Tumor Mutational Burden - 6 Muts/Mb Genomic Findings For a complete list of the genes assayed, please refer to the Appendix. KRAS G12C, amplification ATM S470* CCND1 amplification - equivocal? HGF amplification - equivocal? MYC amplification - equivocal? FGF19 amplification - equivocal? FGF3 amplification - equivocal? FGF4 amplification - equivocal? NFKBIA amplification NKX2-1 amplification RAD21 amplification - equivocal? [id number]*26 TERT promoter -124C>T TP53 rearrangement exon 9 7 Disease relevant genes with no reportable alterations: ALK, BRAF, EGFR, ERBB2, MET, RET, ROS1   PRIOR THERAPY: SRS to the metastatic brain lesions under the care of Dr. Kathrynn Running.  Last treatment on 05/04/2021.   CURRENT THERAPY: Palliative systemic chemotherapy with carboplatin for an AUC 5, Alimta 500 mg/m2 and, Keytruda 200 mg IV every 3 weeks.  First dose expected on 05/08/2021.  Status post 31 cycles.  Starting from cycle #5 he is on maintenance treatment with Alimta and Keytruda every 3 weeks.  INTERVAL HISTORY: Bradley Goodman 67 y.o. male returns to the clinic today for follow-up visit accompanied by his wife.  The patient is feeling fine today with no concerning complaints.  He has no chest pain, shortness of breath, cough or hemoptysis.  He has no nausea, vomiting, diarrhea or constipation.  He has no recent weight loss or  night sweats.  He has no headache or visual changes.  He continues to tolerate his treatment with maintenance Alimta and Keytruda fairly well.  The patient is here today for evaluation before starting cycle #32.   MEDICAL HISTORY: Past Medical History:  Diagnosis Date   Cervical spondylolysis    Essential hypertension    GERD (gastroesophageal reflux disease)    History of kidney stones    History of migraine    Hyperlipidemia    Hypertension    PONV (postoperative nausea and vomiting)    Type 2 diabetes mellitus (HCC)     ALLERGIES:  has No Known Allergies.  MEDICATIONS:  Current Outpatient Medications  Medication Sig Dispense Refill   cholecalciferol (VITAMIN D) 1000 UNITS tablet Take 1,000 Units by mouth every evening.     Continuous Glucose Sensor (FREESTYLE LIBRE 3 SENSOR) MISC Place 1 sensor on the skin every 14 days. Use to check glucose continuously 2 each 2   dexamethasone (DECADRON) 1 MG tablet Take 1 tablet (1 mg total) by mouth daily with breakfast. 90 tablet 1   docusate sodium (COLACE) 100 MG capsule Take 100 mg by mouth daily.     folic acid (FOLVITE) 1 MG tablet Take 1 tablet by mouth daily. 90 tablet 4   Krill Oil 300 MG CAPS Take 300 mg by mouth every evening.     levothyroxine (SYNTHROID) 75 MCG tablet Take 1 tablet (75 mcg total) by mouth daily before breakfast. 90 tablet 1   loratadine (CLARITIN) 10 MG tablet Take 10 mg by mouth every evening.      losartan (COZAAR) 50 MG tablet Take 1 and 1/2 tablets (75 mg total) by mouth  daily. 135 tablet 1   mirtazapine (REMERON) 15 MG tablet Take 1 tablet by mouth at bedtime. 90 tablet 0   Multiple Vitamins-Minerals (MULTIVITAMIN GUMMIES ADULT PO) Take 2 each by mouth daily.     pantoprazole (PROTONIX) 40 MG tablet Take 1 tablet by mouth every evening. 90 tablet 0   PRESCRIPTION MEDICATION Testosterone injection     prochlorperazine (COMPAZINE) 10 MG tablet Take 1 tablet (10 mg total) by mouth every 6 (six) hours as needed  for nausea or vomiting. 30 tablet 0   rosuvastatin (CRESTOR) 20 MG tablet Take 1 tablet (20 mg total) by mouth daily. 90 tablet 1   Semaglutide,0.25 or 0.5MG /DOS, (OZEMPIC, 0.25 OR 0.5 MG/DOSE,) 2 MG/3ML SOPN Inject 0.5 mg into the skin once a week. 9 mL 1   SYRINGE-NEEDLE, DISP, 3 ML 21G X 1-1/2" 3 ML MISC Use to inject testosterone every week 100 each 2   testosterone cypionate (DEPOTESTOTERONE CYPIONATE) 100 MG/ML injection Take 50mg  ( 0.62ml) and 100mg  ( 1ml) into muscle alternatively weekly. 10 mL 0   venlafaxine XR (EFFEXOR-XR) 75 MG 24 hr capsule Take 1 capsule (75 mg total) by mouth daily with breakfast. 90 capsule 1   No current facility-administered medications for this visit.    SURGICAL HISTORY:  Past Surgical History:  Procedure Laterality Date   Bilateral inguinal hernia repair     BRONCHIAL NEEDLE ASPIRATION BIOPSY  04/05/2021   Procedure: BRONCHIAL NEEDLE ASPIRATION BIOPSIES;  Surgeon: Josephine Igo, DO;  Location: [facility];  Service: Pulmonary;;   COLONOSCOPY  01/23/2012   Procedure: COLONOSCOPY;  Surgeon: Corbin Ade, MD;  Location: [facility];  Service: Endoscopy;  Laterality: N/A;  9:30 AM   COLONOSCOPY N/A 10/11/2015   Procedure: COLONOSCOPY;  Surgeon: Corbin Ade, MD;  Location: [facility];  Service: Endoscopy;  Laterality: N/A;  830   COLONOSCOPY N/A 10/14/2019   Procedure: COLONOSCOPY;  Surgeon: Corbin Ade, MD;  Location: [facility];  Service: Endoscopy;  Laterality: N/A;  1:45   POLYPECTOMY  10/14/2019   Procedure: POLYPECTOMY;  Surgeon: Corbin Ade, MD;  Location: [facility];  Service: Endoscopy;;  ascending colon, descending colon   VIDEO BRONCHOSCOPY WITH ENDOBRONCHIAL ULTRASOUND N/A 04/05/2021   Procedure: VIDEO BRONCHOSCOPY WITH ENDOBRONCHIAL ULTRASOUND;  Surgeon: Josephine Igo, DO;  Location: [facility];  Service: Pulmonary;  Laterality: N/A;    REVIEW OF SYSTEMS:  A comprehensive review of systems was negative.   PHYSICAL  EXAMINATION: General appearance: alert, cooperative, appears stated age, and no distress Head: Normocephalic, without obvious abnormality, atraumatic Neck: no adenopathy, no JVD, supple, symmetrical, trachea midline, and thyroid not enlarged, symmetric, no tenderness/mass/nodules Lymph nodes: Cervical, supraclavicular, and axillary nodes normal. Resp: clear to auscultation bilaterally Back: symmetric, no curvature. ROM normal. No CVA tenderness. Cardio: regular rate and rhythm, S1, S2 normal, no murmur, click, rub or gallop GI: soft, non-tender; bowel sounds normal; no masses,  no organomegaly Extremities: extremities normal, atraumatic, no cyanosis or edema  ECOG PERFORMANCE STATUS: 1 - Symptomatic but completely ambulatory  Blood pressure (!) 135/94, pulse 97, temperature 98.3 F (36.8 C), temperature source Oral, resp. rate 18, height 5\' 11"  (1.803 m), weight 190 lb 14.4 oz (86.6 kg), SpO2 100 %.  LABORATORY DATA: Lab Results  Component Value Date   WBC 9.3 03/05/2023   HGB 12.6 (L) 03/05/2023   HCT 42.2 03/05/2023   MCV 85.9 03/05/2023   PLT 312 03/05/2023      Chemistry  Component Value Date/Time   NA 139 02/12/2023 1042   NA 144 01/10/2021 0849   K 3.6 02/12/2023 1042   CL 107 02/12/2023 1042   CO2 24 02/12/2023 1042   BUN 10 02/12/2023 1042   BUN 15 01/10/2021 0849   CREATININE 0.99 02/12/2023 1042      Component Value Date/Time   CALCIUM 8.7 (L) 02/12/2023 1042   ALKPHOS 60 02/12/2023 1042   AST 21 02/12/2023 1042   ALT 21 02/12/2023 1042   BILITOT 0.3 02/12/2023 1042       RADIOGRAPHIC STUDIES: MR BRAIN W WO CONTRAST  Result Date: 03/01/2023 CLINICAL DATA:  Brain/CNS neoplasm, assess treatment response EXAM: MRI HEAD WITHOUT AND WITH CONTRAST TECHNIQUE: Multiplanar, multiecho pulse sequences of the brain and surrounding structures were obtained without and with intravenous contrast. CONTRAST:  2 mL of Vueway IV COMPARISON:  MRI head 10/19/2022. FINDINGS:  Brain: Unchanged 3 mm enhancing lesion in the left cerebellum. Punctate focus of enhancement in the right parietal lobe (see series 13, image 81) was not visualized on the most recent prior but appear similar to more remote prior from 06/15/2022. No other enhancing lesions identified. No evidence of acute infarct, acute hemorrhage, mass lesion, midline shift or hydrocephalus. Vascular: Major arterial flow voids are maintained at the skull base. Skull and upper cervical spine: Normal marrow signal. Sinuses/Orbits: Mild paranasal sinus mucosal thickening. No acute orbital findings. Other: Trace bilateral mastoid effusions. IMPRESSION: 1. Unchanged 3 mm enhancing lesion in the left cerebellum. 2. Punctate focus of enhancement in the right parietal lobe was not visualized on the most recent prior but appear similar to more remote prior from 06/15/2022. Electronically Signed   By: Feliberto Harts M.D.   On: 03/01/2023 10:48   CT Chest W Contrast  Result Date: 02/12/2023 CLINICAL DATA:  Non-small-cell lung cancer. Ongoing chemotherapy. Asymptomatic. EXAM: CT CHEST, ABDOMEN, AND PELVIS WITH CONTRAST TECHNIQUE: Multidetector CT imaging of the chest, abdomen and pelvis was performed following the standard protocol during bolus administration of intravenous contrast. RADIATION DOSE REDUCTION: This exam was performed according to the departmental dose-optimization program which includes automated exposure control, adjustment of the mA and/or kV according to patient size and/or use of iterative reconstruction technique. CONTRAST:  OMNIPAQUE IOHEXOL 300 MG/ML  SOLN COMPARISON:  10/11/2022 staging CTs. Interval CTA chest of 11/08/2022. FINDINGS: CT CHEST FINDINGS Cardiovascular: Aortic atherosclerosis. Normal heart size with minimal anterior pericardial fluid, most likely physiologic. Left main coronary artery calcification. No central pulmonary embolism, on this non-dedicated study. Mediastinum/Nodes: No  supraclavicular adenopathy. No axillary adenopathy. No mediastinal or hilar adenopathy. Lungs/Pleura: No pleural fluid. Mild centrilobular emphysema. Mild biapical pleuroparenchymal scarring. Again identified are bilateral solid and non solid pulmonary nodules which are similar in size and distribution. Index right upper lobe solid 7 mm nodule is similar on 54/4. A posterior right upper lobe vague, possibly mixed attenuation 6 mm nodule is unchanged on 52/4. Left upper lobe 7 mm ground-glass nodule on 86/4 is unchanged. Musculoskeletal: No acute osseous abnormality. Tiny sclerotic lesions within bilateral ribs are similar and likely bone islands. Posterior posterior T6 lytic lesion including on sagittal image 117 is similar back to 01/17/2017, most consistent with a benign entity, likely a hemangioma. CT ABDOMEN PELVIS FINDINGS Hepatobiliary: Tiny low-density liver lesions are too small to characterize but similar to on the prior including at up to 6 mm. Normal gallbladder, without biliary ductal dilatation. Pancreas: Normal, without mass or ductal dilatation. Spleen: Normal in size, without focal abnormality. Adrenals/Urinary  Tract: Normal adrenal glands. 7 mm left renal collecting system calculus. Normal right kidney. No hydronephrosis. Normal urinary bladder. Stomach/Bowel: Normal stomach, without wall thickening. Scattered colonic diverticula. Colonic stool burden suggests constipation. Normal terminal ileum and appendix. Normal small bowel. Vascular/Lymphatic: Aortic atherosclerosis. No abdominopelvic adenopathy. Reproductive: Mild prostatomegaly. Other: No significant free fluid. No free intraperitoneal air. No evidence of omental or peritoneal disease. Musculoskeletal: No acute osseous abnormality. IMPRESSION: 1. No significant change since 10/11/2022. Small nonspecific bilateral pulmonary nodules are unchanged. 2. Aortic atherosclerosis (ICD10-I70.0), coronary artery atherosclerosis and emphysema  (ICD10-J43.9). 3. Left nephrolithiasis Electronically Signed   By: Jeronimo Greaves M.D.   On: 02/12/2023 10:25   CT Abdomen Pelvis W Contrast  Result Date: 02/12/2023 CLINICAL DATA:  Non-small-cell lung cancer. Ongoing chemotherapy. Asymptomatic. EXAM: CT CHEST, ABDOMEN, AND PELVIS WITH CONTRAST TECHNIQUE: Multidetector CT imaging of the chest, abdomen and pelvis was performed following the standard protocol during bolus administration of intravenous contrast. RADIATION DOSE REDUCTION: This exam was performed according to the departmental dose-optimization program which includes automated exposure control, adjustment of the mA and/or kV according to patient size and/or use of iterative reconstruction technique. CONTRAST:  OMNIPAQUE IOHEXOL 300 MG/ML  SOLN COMPARISON:  10/11/2022 staging CTs. Interval CTA chest of 11/08/2022. FINDINGS: CT CHEST FINDINGS Cardiovascular: Aortic atherosclerosis. Normal heart size with minimal anterior pericardial fluid, most likely physiologic. Left main coronary artery calcification. No central pulmonary embolism, on this non-dedicated study. Mediastinum/Nodes: No supraclavicular adenopathy. No axillary adenopathy. No mediastinal or hilar adenopathy. Lungs/Pleura: No pleural fluid. Mild centrilobular emphysema. Mild biapical pleuroparenchymal scarring. Again identified are bilateral solid and non solid pulmonary nodules which are similar in size and distribution. Index right upper lobe solid 7 mm nodule is similar on 54/4. A posterior right upper lobe vague, possibly mixed attenuation 6 mm nodule is unchanged on 52/4. Left upper lobe 7 mm ground-glass nodule on 86/4 is unchanged. Musculoskeletal: No acute osseous abnormality. Tiny sclerotic lesions within bilateral ribs are similar and likely bone islands. Posterior posterior T6 lytic lesion including on sagittal image 117 is similar back to 01/17/2017, most consistent with a benign entity, likely a hemangioma. CT ABDOMEN PELVIS  FINDINGS Hepatobiliary: Tiny low-density liver lesions are too small to characterize but similar to on the prior including at up to 6 mm. Normal gallbladder, without biliary ductal dilatation. Pancreas: Normal, without mass or ductal dilatation. Spleen: Normal in size, without focal abnormality. Adrenals/Urinary Tract: Normal adrenal glands. 7 mm left renal collecting system calculus. Normal right kidney. No hydronephrosis. Normal urinary bladder. Stomach/Bowel: Normal stomach, without wall thickening. Scattered colonic diverticula. Colonic stool burden suggests constipation. Normal terminal ileum and appendix. Normal small bowel. Vascular/Lymphatic: Aortic atherosclerosis. No abdominopelvic adenopathy. Reproductive: Mild prostatomegaly. Other: No significant free fluid. No free intraperitoneal air. No evidence of omental or peritoneal disease. Musculoskeletal: No acute osseous abnormality. IMPRESSION: 1. No significant change since 10/11/2022. Small nonspecific bilateral pulmonary nodules are unchanged. 2. Aortic atherosclerosis (ICD10-I70.0), coronary artery atherosclerosis and emphysema (ICD10-J43.9). 3. Left nephrolithiasis Electronically Signed   By: Jeronimo Greaves M.D.   On: 02/12/2023 10:25    ASSESSMENT AND PLAN: This is a very pleasant 67 years old white male recently diagnosed with stage IV (T1b, N3, M1 C) non-small cell lung cancer favoring adenocarcinoma presented with right upper lobe lung nodule in addition to right hilar, subcarinal and bilateral mediastinal as well as supraclavicular lymphadenopathy.  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  His PD-L1 expression is 80% and his  molecular studies showed KRAS G12C mutation. The patient underwent SRS to metastatic brain lesion under the care of Dr. Kathrynn Running and he is currently undergoing systemic chemotherapy with carboplatin for AUC of 5, Alimta 500 Mg/M2 and Keytruda 200 Mg IV every 3 weeks status post 31 cycles.   Starting from cycle #5 the patient will be treated with maintenance treatment with Alimta and Keytruda every 3 weeks. The patient has been tolerating this treatment well with no concerning adverse effects. I recommended for him to proceed with cycle #32 today as planned. I will see him back for follow-up visit in 3 weeks for evaluation before the next cycle of his treatment. He was advised to call immediately if he has any other concerning symptoms in the interval. The patient voices understanding of current disease status and treatment options and is in agreement with the current care plan.  All questions were answered. The patient knows to call the clinic with any problems, questions or concerns. We can certainly see the patient much sooner if necessary. The total time spent in the appointment was 20 minutes.  Disclaimer: This note was dictated with voice recognition software. Similar sounding words can inadvertently be transcribed and may not be corrected upon review.

## 2023-03-05 NOTE — Progress Notes (Signed)
Patient seen by Dr. Gypsy Balsam are and are within treatment parameters.  Labs reviewed: and are within treatment parameters.  Per physician team, patient is ready for treatment and there are NO modifications to the treatment plan. Carboplatin.Alimta and Pembrolizumab.

## 2023-03-06 DIAGNOSIS — H01001 Unspecified blepharitis right upper eyelid: Secondary | ICD-10-CM | POA: Diagnosis not present

## 2023-03-06 DIAGNOSIS — H01004 Unspecified blepharitis left upper eyelid: Secondary | ICD-10-CM | POA: Diagnosis not present

## 2023-03-06 DIAGNOSIS — H02834 Dermatochalasis of left upper eyelid: Secondary | ICD-10-CM | POA: Diagnosis not present

## 2023-03-06 DIAGNOSIS — H02831 Dermatochalasis of right upper eyelid: Secondary | ICD-10-CM | POA: Diagnosis not present

## 2023-03-07 ENCOUNTER — Other Ambulatory Visit (HOSPITAL_COMMUNITY): Payer: Self-pay

## 2023-03-07 ENCOUNTER — Other Ambulatory Visit: Payer: Self-pay

## 2023-03-07 ENCOUNTER — Other Ambulatory Visit: Payer: Self-pay | Admitting: Family Medicine

## 2023-03-07 DIAGNOSIS — I152 Hypertension secondary to endocrine disorders: Secondary | ICD-10-CM

## 2023-03-07 MED ORDER — LOSARTAN POTASSIUM 50 MG PO TABS
75.0000 mg | ORAL_TABLET | Freq: Every day | ORAL | 0 refills | Status: DC
Start: 2023-03-07 — End: 2023-04-16
  Filled 2023-03-07: qty 135, 90d supply, fill #0

## 2023-03-14 DIAGNOSIS — H02834 Dermatochalasis of left upper eyelid: Secondary | ICD-10-CM | POA: Diagnosis not present

## 2023-03-14 DIAGNOSIS — H02831 Dermatochalasis of right upper eyelid: Secondary | ICD-10-CM | POA: Diagnosis not present

## 2023-03-14 DIAGNOSIS — H01004 Unspecified blepharitis left upper eyelid: Secondary | ICD-10-CM | POA: Diagnosis not present

## 2023-03-18 ENCOUNTER — Other Ambulatory Visit: Payer: Self-pay

## 2023-03-18 ENCOUNTER — Other Ambulatory Visit: Payer: Self-pay | Admitting: Family Medicine

## 2023-03-18 ENCOUNTER — Other Ambulatory Visit (HOSPITAL_COMMUNITY): Payer: Self-pay

## 2023-03-18 DIAGNOSIS — E1165 Type 2 diabetes mellitus with hyperglycemia: Secondary | ICD-10-CM

## 2023-03-18 MED ORDER — FREESTYLE LIBRE 3 SENSOR MISC
1.0000 | 3 refills | Status: DC
Start: 2023-03-18 — End: 2024-02-29
  Filled 2023-03-18: qty 6, 84d supply, fill #0
  Filled 2023-04-04: qty 2, 28d supply, fill #0
  Filled 2023-05-05: qty 2, 28d supply, fill #1
  Filled 2023-05-30: qty 2, 28d supply, fill #2
  Filled 2023-06-25: qty 2, 28d supply, fill #3
  Filled 2023-07-30: qty 2, 28d supply, fill #4
  Filled 2023-09-02: qty 2, 28d supply, fill #5
  Filled 2023-10-02: qty 2, 28d supply, fill #6
  Filled 2023-10-27: qty 2, 28d supply, fill #7
  Filled 2023-11-23: qty 2, 28d supply, fill #8
  Filled 2023-12-24: qty 2, 28d supply, fill #9
  Filled 2024-01-19: qty 2, 28d supply, fill #10
  Filled 2024-02-10: qty 2, 28d supply, fill #11

## 2023-03-19 ENCOUNTER — Other Ambulatory Visit: Payer: Self-pay

## 2023-03-19 ENCOUNTER — Encounter: Payer: Self-pay | Admitting: Pharmacist

## 2023-03-20 NOTE — Progress Notes (Signed)
[facility] OFFICE PROGRESS NOTE  Bradley Masters, FNP [address] [place] [address]  DIAGNOSIS: Stage IV (T1b, N3, M1C) non-small cell lung cancer, favoring adenocarcinoma presented with right upper lobe lung nodule in addition to right hilar, subcarinal and bilateral mediastinal as well as supraclavicular lymphadenopathy in addition to bone and brain metastasis diagnosed in June 2022.   PD-L1 expression 80%.     Molecular Studies:  Biomarker Findings Microsatellite status - MS-Stable Tumor Mutational Burden - 6 Muts/Mb Genomic Findings For a complete list of the genes assayed, please refer to the Appendix. KRAS G12C, amplification ATM S470* CCND1 amplification - equivocal? HGF amplification - equivocal? MYC amplification - equivocal? FGF19 amplification - equivocal? FGF3 amplification - equivocal? FGF4 amplification - equivocal? NFKBIA amplification NKX2-1 amplification RAD21 amplification - equivocal? [id number]*26 TERT promoter -124C>T TP53 rearrangement exon 9 7 Disease relevant genes with no reportable alterations: ALK, BRAF, EGFR, ERBB2, MET, RET, ROS1  PRIOR THERAPY:  SRS to the metastatic brain lesions under the care of Dr. Kathrynn Running.  Last treatment on 05/04/2021.   CURRENT THERAPY: Palliative systemic chemotherapy with carboplatin for an AUC 5, Alimta 500 mg/m2 and, Keytruda 200 mg IV every 3 weeks.  First dose expected on 05/08/2021.  Status post 32 cycles.  Starting from cycle #5 he is on maintenance treatment with Alimta and Keytruda every 3 weeks.    INTERVAL HISTORY: Bradley Goodman 67 y.o. male returns to the clinic today for a follow-up visit accompanied by his wife.  The patient was last seen by Dr. Arbutus Ped 3 weeks ago. He feels well today. He is taking doxycycline for an eyelid infection which has cleared up.  They are wondering how long he needs to stay out of the sun for after completing his antibiotic.  The patient enjoys  gardening.  He has a UV longsleeve shirt.  Today, he denies any fever, chills, night sweats, or unexplained weight loss.  Denies any dyspnea, cough, chest pain, or hemoptysis.  Denies any nausea, vomiting, diarrhea, or constipation. The patient reports a long history of migraines. He denies unusual headaches. Denies rashes and skin changes except for easy bruising. He is here for evaluation and repeat blood work before starting cycle 33   MEDICAL HISTORY: Past Medical History:  Diagnosis Date   Cervical spondylolysis    Essential hypertension    GERD (gastroesophageal reflux disease)    History of kidney stones    History of migraine    Hyperlipidemia    Hypertension    PONV (postoperative nausea and vomiting)    Type 2 diabetes mellitus (HCC)     ALLERGIES:  has No Known Allergies.  MEDICATIONS:  Current Outpatient Medications  Medication Sig Dispense Refill   cholecalciferol (VITAMIN D) 1000 UNITS tablet Take 1,000 Units by mouth every evening.     Continuous Glucose Sensor (FREESTYLE LIBRE 3 SENSOR) MISC Place 1 sensor on the skin every 14 days. Use to check glucose continuously 6 each 3   dexamethasone (DECADRON) 1 MG tablet Take 1 tablet (1 mg total) by mouth daily with breakfast. 90 tablet 1   docusate sodium (COLACE) 100 MG capsule Take 100 mg by mouth daily.     folic acid (FOLVITE) 1 MG tablet Take 1 tablet by mouth daily. 90 tablet 4   Krill Oil 300 MG CAPS Take 300 mg by mouth every evening.     levothyroxine (SYNTHROID) 75 MCG tablet Take 1 tablet (75 mcg total) by mouth daily before breakfast.  90 tablet 1   loratadine (CLARITIN) 10 MG tablet Take 10 mg by mouth every evening.      losartan (COZAAR) 50 MG tablet Take 1 and 1/2 tablets (75 mg total) by mouth daily. 135 tablet 0   mirtazapine (REMERON) 15 MG tablet Take 1 tablet by mouth at bedtime. 90 tablet 0   Multiple Vitamins-Minerals (MULTIVITAMIN GUMMIES ADULT PO) Take 2 each by mouth daily.     pantoprazole  (PROTONIX) 40 MG tablet Take 1 tablet by mouth every evening. 90 tablet 0   PRESCRIPTION MEDICATION Testosterone injection     prochlorperazine (COMPAZINE) 10 MG tablet Take 1 tablet (10 mg total) by mouth every 6 (six) hours as needed for nausea or vomiting. 30 tablet 0   rosuvastatin (CRESTOR) 20 MG tablet Take 1 tablet (20 mg total) by mouth daily. 90 tablet 1   Semaglutide,0.25 or 0.5MG /DOS, (OZEMPIC, 0.25 OR 0.5 MG/DOSE,) 2 MG/3ML SOPN Inject 0.5 mg into the skin once a week. 9 mL 1   SYRINGE-NEEDLE, DISP, 3 ML 21G X 1-1/2" 3 ML MISC Use to inject testosterone every week 100 each 2   testosterone cypionate (DEPOTESTOTERONE CYPIONATE) 100 MG/ML injection Take 50mg  ( 0.60ml) and 100mg  ( 1ml) into muscle alternatively weekly. 10 mL 0   venlafaxine XR (EFFEXOR-XR) 75 MG 24 hr capsule Take 1 capsule (75 mg total) by mouth daily with breakfast. 90 capsule 1   No current facility-administered medications for this visit.    SURGICAL HISTORY:  Past Surgical History:  Procedure Laterality Date   Bilateral inguinal hernia repair     BRONCHIAL NEEDLE ASPIRATION BIOPSY  04/05/2021   Procedure: BRONCHIAL NEEDLE ASPIRATION BIOPSIES;  Surgeon: Josephine Igo, DO;  Location: [facility];  Service: Pulmonary;;   COLONOSCOPY  01/23/2012   Procedure: COLONOSCOPY;  Surgeon: Corbin Ade, MD;  Location: [facility];  Service: Endoscopy;  Laterality: N/A;  9:30 AM   COLONOSCOPY N/A 10/11/2015   Procedure: COLONOSCOPY;  Surgeon: Corbin Ade, MD;  Location: [facility];  Service: Endoscopy;  Laterality: N/A;  830   COLONOSCOPY N/A 10/14/2019   Procedure: COLONOSCOPY;  Surgeon: Corbin Ade, MD;  Location: [facility];  Service: Endoscopy;  Laterality: N/A;  1:45   POLYPECTOMY  10/14/2019   Procedure: POLYPECTOMY;  Surgeon: Corbin Ade, MD;  Location: [facility];  Service: Endoscopy;;  ascending colon, descending colon   VIDEO BRONCHOSCOPY WITH ENDOBRONCHIAL ULTRASOUND N/A 04/05/2021   Procedure:  VIDEO BRONCHOSCOPY WITH ENDOBRONCHIAL ULTRASOUND;  Surgeon: Josephine Igo, DO;  Location: [facility];  Service: Pulmonary;  Laterality: N/A;    REVIEW OF SYSTEMS:   Review of Systems  Constitutional: Negative for appetite change, chills, fatigue, fever and unexpected weight change.  HENT:   Negative for mouth sores, nosebleeds, sore throat and trouble swallowing.   Eyes: Negative for eye problems and icterus.  Respiratory: Negative for cough, hemoptysis, shortness of breath and wheezing.   Cardiovascular: Negative for chest pain and leg swelling.  Gastrointestinal: Negative for abdominal pain, constipation, diarrhea, nausea and vomiting.  Genitourinary: Negative for bladder incontinence, difficulty urinating, dysuria, frequency and hematuria.   Musculoskeletal: Negative for back pain, gait problem, neck pain and neck stiffness.  Skin: Negative for itching and rash.  Neurological: Negative for dizziness, extremity weakness, gait problem, headaches, light-headedness and seizures.  Hematological: Negative for adenopathy. Does not bleed easily. Positive for easy bruising.  Psychiatric/Behavioral: Negative for confusion, depression and sleep disturbance. The patient is not nervous/anxious.  PHYSICAL EXAMINATION:  Blood pressure 127/86, pulse 98, temperature 98.2 F (36.8 C), temperature source Oral, resp. rate 16, weight 187 lb 12.8 oz (85.2 kg), SpO2 100 %.  ECOG PERFORMANCE STATUS: 1  Physical Exam  Constitutional: Oriented to person, place, and time and well-developed, well-nourished, and in no distress. HENT:  Head: Normocephalic and atraumatic.  Mouth/Throat: Oropharynx is clear and moist. No oropharyngeal exudate.  Eyes: Conjunctivae are normal. Right eye exhibits no discharge. Left eye exhibits no discharge. No scleral icterus.  Neck: Normal range of motion. Neck supple.  Cardiovascular: Normal rate, regular rhythm, normal heart sounds and intact distal pulses.    Pulmonary/Chest: Effort normal and breath sounds normal. No respiratory distress. No wheezes. No rales.  Abdominal: Soft. Bowel sounds are normal. Exhibits no distension and no mass. There is no tenderness.  Musculoskeletal: Normal range of motion. Exhibits no edema.  Lymphadenopathy:    No cervical adenopathy.  Neurological: Alert and oriented to person, place, and time. Exhibits normal muscle tone. Gait normal. Coordination normal.  Skin: Positive for extremity bruising. Skin is warm and dry. No rash noted. Not diaphoretic. No erythema. No pallor.  Psychiatric: Mood, memory and judgment normal.  Vitals reviewed.  LABORATORY DATA: Lab Results  Component Value Date   WBC 9.3 03/05/2023   HGB 12.6 (L) 03/05/2023   HCT 42.2 03/05/2023   MCV 85.9 03/05/2023   PLT 312 03/05/2023      Chemistry      Component Value Date/Time   NA 141 03/05/2023 0838   NA 144 01/10/2021 0849   K 3.8 03/05/2023 0838   CL 106 03/05/2023 0838   CO2 28 03/05/2023 0838   BUN 12 03/05/2023 0838   BUN 15 01/10/2021 0849   CREATININE 1.06 03/05/2023 0838      Component Value Date/Time   CALCIUM 8.7 (L) 03/05/2023 0838   ALKPHOS 56 03/05/2023 0838   AST 19 03/05/2023 0838   ALT 17 03/05/2023 0838   BILITOT 0.3 03/05/2023 0838       RADIOGRAPHIC STUDIES:  No results found.   ASSESSMENT/PLAN:  This is a very pleasant 67 year old Caucasian male diagnosed with stage IV (T1b, N3, M1 C) non-small cell lung cancer, favoring adenocarcinoma.  The patient presented with a right upper lobe lung nodule in addition to right hilar, subcarinal, and bilateral mediastinal lymphadenopathy as well as supraclavicular lymphadenopathy.  The patient also has metastatic disease to the brain.  He was diagnosed in June 2022.  His PD-L1 expression is 80%.  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   The patient completed SRS  to the brain lesions under the care of Dr. Kathrynn Running.  His last treatment was on 05/04/2021. 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   The patient is currently undergoing systemic chemotherapy with carboplatin for an AUC of 5, Alimta 500 mg per metered squared, Keytruda 200 mg IV every 3 weeks.  Status post 32 cycles. Starting from cycle #5, the patient started maintenance Alimta and Keytruda IV every 3 weeks.   Labs are still pending at this time. As long as these are within parameters, recommend that he proceed  with cycle #33 today as scheduled.    We will see him back for a follow up in 3 weeks for evaluation and repeat blood work before starting cycle #34.   We discussed waiting 10-14 days after  completing doxycycline for sun exposure, even then, he will be careful and use sun protection.   The patient was advised to call immediately if she has any concerning symptoms in the interval. The patient voices understanding of current disease status and treatment options and is in agreement with the current care plan. All questions were answered. The patient knows to call the clinic with any problems, questions or concerns. We can certainly see the patient much sooner if necessary          Orders Placed This Encounter  Procedures   CBC with Differential (Cancer Center Only)    Standing Status:   Future    Standing Expiration Date:   03/25/2024   CMP (Cancer Center only)    Standing Status:   Future    Standing Expiration Date:   03/25/2024   CBC with Differential (Cancer Center Only)    Standing Status:   Future    Standing Expiration Date:   04/15/2024   CMP (Cancer Center only)    Standing Status:   Future    Standing Expiration Date:   04/15/2024   T4    Standing Status:   Future    Standing Expiration Date:   04/15/2024   TSH    Standing Status:   Future    Standing Expiration Date:   04/15/2024   CBC with Differential (Cancer Center Only)     Standing Status:   Future    Standing Expiration Date:   05/06/2024   CMP (Cancer Center only)    Standing Status:   Future    Standing Expiration Date:   05/06/2024   CBC with Differential (Cancer Center Only)    Standing Status:   Future    Standing Expiration Date:   05/27/2024   CMP (Cancer Center only)    Standing Status:   Future    Standing Expiration Date:   05/27/2024   CBC with Differential (Cancer Center Only)    Standing Status:   Future    Standing Expiration Date:   06/17/2024   CMP (Cancer Center only)    Standing Status:   Future    Standing Expiration Date:   06/17/2024   T4    Standing Status:   Future    Standing Expiration Date:   06/17/2024   TSH    Standing Status:   Future    Standing Expiration Date:   06/17/2024   CBC with Differential (Cancer Center Only)    Standing Status:   Future    Standing Expiration Date:   07/08/2024   CMP (Cancer Center only)    Standing Status:   Future    Standing Expiration Date:   07/08/2024   CBC with Differential (Cancer Center Only)    Standing Status:   Future    Standing Expiration Date:   07/29/2024   CMP (Cancer Center only)    Standing Status:   Future    Standing Expiration Date:   07/29/2024    The total time spent in the appointment was 20-29 minutes.   Salena Ortlieb L Timoth Schara, PA-C 03/26/23

## 2023-03-21 ENCOUNTER — Other Ambulatory Visit: Payer: Self-pay

## 2023-03-22 ENCOUNTER — Other Ambulatory Visit: Payer: Self-pay

## 2023-03-25 ENCOUNTER — Other Ambulatory Visit: Payer: Self-pay | Admitting: *Deleted

## 2023-03-25 DIAGNOSIS — C3491 Malignant neoplasm of unspecified part of right bronchus or lung: Secondary | ICD-10-CM

## 2023-03-26 ENCOUNTER — Other Ambulatory Visit: Payer: Self-pay

## 2023-03-26 ENCOUNTER — Inpatient Hospital Stay: Payer: Commercial Managed Care - PPO | Admitting: Physician Assistant

## 2023-03-26 ENCOUNTER — Inpatient Hospital Stay: Payer: Commercial Managed Care - PPO | Attending: Physician Assistant

## 2023-03-26 ENCOUNTER — Inpatient Hospital Stay: Payer: Commercial Managed Care - PPO

## 2023-03-26 VITALS — BP 127/86 | HR 98 | Temp 98.2°F | Resp 16 | Wt 187.8 lb

## 2023-03-26 VITALS — BP 143/90 | HR 87 | Resp 16

## 2023-03-26 DIAGNOSIS — E230 Hypopituitarism: Secondary | ICD-10-CM | POA: Insufficient documentation

## 2023-03-26 DIAGNOSIS — Z79899 Other long term (current) drug therapy: Secondary | ICD-10-CM | POA: Diagnosis not present

## 2023-03-26 DIAGNOSIS — C7951 Secondary malignant neoplasm of bone: Secondary | ICD-10-CM | POA: Insufficient documentation

## 2023-03-26 DIAGNOSIS — F17210 Nicotine dependence, cigarettes, uncomplicated: Secondary | ICD-10-CM | POA: Diagnosis not present

## 2023-03-26 DIAGNOSIS — C7931 Secondary malignant neoplasm of brain: Secondary | ICD-10-CM | POA: Diagnosis not present

## 2023-03-26 DIAGNOSIS — Z5112 Encounter for antineoplastic immunotherapy: Secondary | ICD-10-CM

## 2023-03-26 DIAGNOSIS — C3491 Malignant neoplasm of unspecified part of right bronchus or lung: Secondary | ICD-10-CM | POA: Diagnosis not present

## 2023-03-26 DIAGNOSIS — Z5111 Encounter for antineoplastic chemotherapy: Secondary | ICD-10-CM | POA: Insufficient documentation

## 2023-03-26 DIAGNOSIS — Z79620 Long term (current) use of immunosuppressive biologic: Secondary | ICD-10-CM | POA: Insufficient documentation

## 2023-03-26 DIAGNOSIS — Z923 Personal history of irradiation: Secondary | ICD-10-CM | POA: Diagnosis not present

## 2023-03-26 DIAGNOSIS — C3411 Malignant neoplasm of upper lobe, right bronchus or lung: Secondary | ICD-10-CM | POA: Insufficient documentation

## 2023-03-26 LAB — CMP (CANCER CENTER ONLY)
ALT: 15 U/L (ref 0–44)
AST: 23 U/L (ref 15–41)
Albumin: 3.8 g/dL (ref 3.5–5.0)
Alkaline Phosphatase: 64 U/L (ref 38–126)
Anion gap: 7 (ref 5–15)
BUN: 13 mg/dL (ref 8–23)
CO2: 27 mmol/L (ref 22–32)
Calcium: 9.3 mg/dL (ref 8.9–10.3)
Chloride: 107 mmol/L (ref 98–111)
Creatinine: 1 mg/dL (ref 0.61–1.24)
GFR, Estimated: >60 mL/min (ref >60)
Glucose, Bld: 99 mg/dL (ref 70–99)
Potassium: 4.3 mmol/L (ref 3.5–5.1)
Sodium: 141 mmol/L (ref 135–145)
Total Bilirubin: 0.3 mg/dL (ref 0.3–1.2)
Total Protein: 6.6 g/dL (ref 6.5–8.1)

## 2023-03-26 LAB — CBC WITH DIFFERENTIAL (CANCER CENTER ONLY)
Abs Immature Granulocytes: 0.05 10*3/uL (ref 0.00–0.07)
Basophils Absolute: 0.1 10*3/uL (ref 0.0–0.1)
Basophils Relative: 1 %
Eosinophils Absolute: 0.1 10*3/uL (ref 0.0–0.5)
Eosinophils Relative: 1 %
HCT: 42.2 % (ref 39.0–52.0)
Hemoglobin: 12.8 g/dL — ABNORMAL LOW (ref 13.0–17.0)
Immature Granulocytes: 1 %
Lymphocytes Relative: 13 %
Lymphs Abs: 1.3 10*3/uL (ref 0.7–4.0)
MCH: 25.9 pg — ABNORMAL LOW (ref 26.0–34.0)
MCHC: 30.3 g/dL (ref 30.0–36.0)
MCV: 85.4 fL (ref 80.0–100.0)
Monocytes Absolute: 0.6 10*3/uL (ref 0.1–1.0)
Monocytes Relative: 6 %
Neutro Abs: 8.1 10*3/uL — ABNORMAL HIGH (ref 1.7–7.7)
Neutrophils Relative %: 78 %
Platelet Count: 397 10*3/uL (ref 150–400)
RBC: 4.94 MIL/uL (ref 4.22–5.81)
RDW: 20.1 % — ABNORMAL HIGH (ref 11.5–15.5)
WBC Count: 10.3 10*3/uL (ref 4.0–10.5)
nRBC: 0 % (ref 0.0–0.2)

## 2023-03-26 LAB — TSH: TSH: 0.839 u[IU]/mL (ref 0.350–4.500)

## 2023-03-26 MED ORDER — SODIUM CHLORIDE 0.9 % IV SOLN
500.0000 mg/m2 | Freq: Once | INTRAVENOUS | Status: AC
  Administered 2023-03-26: 1000 mg via INTRAVENOUS
  Filled 2023-03-26: qty 40

## 2023-03-26 MED ORDER — SODIUM CHLORIDE 0.9 % IV SOLN
200.0000 mg | Freq: Once | INTRAVENOUS | Status: AC
  Administered 2023-03-26: 200 mg via INTRAVENOUS
  Filled 2023-03-26: qty 200

## 2023-03-26 MED ORDER — CYANOCOBALAMIN 1000 MCG/ML IJ SOLN
1000.0000 ug | Freq: Once | INTRAMUSCULAR | Status: AC
  Administered 2023-03-26: 1000 ug via INTRAMUSCULAR
  Filled 2023-03-26: qty 1

## 2023-03-26 MED ORDER — PROCHLORPERAZINE MALEATE 10 MG PO TABS
10.0000 mg | ORAL_TABLET | Freq: Once | ORAL | Status: AC
  Administered 2023-03-26: 10 mg via ORAL
  Filled 2023-03-26: qty 1

## 2023-03-26 MED ORDER — SODIUM CHLORIDE 0.9 % IV SOLN: Freq: Once | INTRAVENOUS | Status: AC

## 2023-03-26 NOTE — Patient Instructions (Signed)
[facility]   Discharge Instructions: Thank you for choosing [facility] to provide your oncology and hematology care.   If you have a lab appointment with the Cancer Center, please go directly to the Cancer Center and check in at the registration area.   Wear comfortable clothing and clothing appropriate for easy access to any Portacath or PICC line.   We strive to give you quality time with your provider. You may need to reschedule your appointment if you arrive late (15 or more minutes).  Arriving late affects you and other patients whose appointments are after yours.  Also, if you miss three or more appointments without notifying the office, you may be dismissed from the clinic at the provider's discretion.      For prescription refill requests, have your pharmacy contact our office and allow 72 hours for refills to be completed.    Today you received the following chemotherapy and/or immunotherapy agents: Pembrolizumab (Keytruda) and Pemetrexed (Alimta)      To help prevent nausea and vomiting after your treatment, we encourage you to take your nausea medication as directed.  BELOW ARE SYMPTOMS THAT SHOULD BE REPORTED IMMEDIATELY: *FEVER GREATER THAN 100.4 F (38 C) OR HIGHER *CHILLS OR SWEATING *NAUSEA AND VOMITING THAT IS NOT CONTROLLED WITH YOUR NAUSEA MEDICATION *UNUSUAL SHORTNESS OF BREATH *UNUSUAL BRUISING OR BLEEDING *URINARY PROBLEMS (pain or burning when urinating, or frequent urination) *BOWEL PROBLEMS (unusual diarrhea, constipation, pain near the anus) TENDERNESS IN MOUTH AND THROAT WITH OR WITHOUT PRESENCE OF ULCERS (sore throat, sores in mouth, or a toothache) UNUSUAL RASH, SWELLING OR PAIN  UNUSUAL VAGINAL DISCHARGE OR ITCHING   Items with * indicate a potential emergency and should be followed up as soon as possible or go to the Emergency Department if any problems should occur.  Please show the CHEMOTHERAPY ALERT  CARD or IMMUNOTHERAPY ALERT CARD at check-in to the Emergency Department and triage nurse.  Should you have questions after your visit or need to cancel or reschedule your appointment, please contact [facility]  Dept: [phone]  and follow the prompts.  Office hours are 8:00 a.m. to 4:30 p.m. Monday - Friday. Please note that voicemails left after 4:00 p.m. may not be returned until the following business day.  We are closed weekends and major holidays. You have access to a nurse at all times for urgent questions. Please call the main number to the clinic Dept: [phone] and follow the prompts.   For any non-urgent questions, you may also contact your provider using MyChart. We now offer e-Visits for anyone 18 and older to request care online for non-urgent symptoms. For details visit [email or web address].   Also download the MyChart app! Go to the app store, search "MyChart", open the app, select , and log in with your MyChart username and password. 

## 2023-03-28 LAB — T4: T4, Total: 7.4 ug/dL (ref 4.5–12.0)

## 2023-04-04 ENCOUNTER — Other Ambulatory Visit: Payer: Self-pay

## 2023-04-04 ENCOUNTER — Other Ambulatory Visit (HOSPITAL_COMMUNITY): Payer: Self-pay

## 2023-04-08 NOTE — Progress Notes (Signed)
[facility] OFFICE PROGRESS NOTE  Bradley Masters, FNP [address] [place] [address]  DIAGNOSIS:  Stage IV (T1b, N3, M1C) non-small cell lung cancer, favoring adenocarcinoma presented with right upper lobe lung nodule in addition to right hilar, subcarinal and bilateral mediastinal as well as supraclavicular lymphadenopathy in addition to bone and brain metastasis diagnosed in June 2022.   PD-L1 expression 80%.     Molecular Studies:  Biomarker Findings Microsatellite status - MS-Stable Tumor Mutational Burden - 6 Muts/Mb Genomic Findings For a complete list of the genes assayed, please refer to the Appendix. KRAS G12C, amplification ATM S470* CCND1 amplification - equivocal? HGF amplification - equivocal? MYC amplification - equivocal? FGF19 amplification - equivocal? FGF3 amplification - equivocal? FGF4 amplification - equivocal? NFKBIA amplification NKX2-1 amplification RAD21 amplification - equivocal? [id number]*26 TERT promoter -124C>T TP53 rearrangement exon 9 7 Disease relevant genes with no reportable alterations: ALK, BRAF, EGFR, ERBB2, MET, RET, ROS1  PRIOR THERAPY: SRS to the metastatic brain lesions under the care of Dr. Kathrynn Running.  Last treatment on 05/04/2021.   CURRENT THERAPY: Palliative systemic chemotherapy with carboplatin for an AUC 5, Alimta 500 mg/m2 and, Keytruda 200 mg IV every 3 weeks.  First dose expected on 05/08/2021.  Status post 33 cycles.  Starting from cycle #5 he is on maintenance treatment with Alimta and Keytruda every 3 weeks.    INTERVAL HISTORY: Bradley Goodman 67 y.o. male returns to the clinic today for a follow-up visit accompanied by his wife. The patient was last seen by myself 3 weeks ago. today, he denies any fever, chills, night sweats, or unexplained weight loss.  Denies any dyspnea, cough, chest pain, or hemoptysis.  Denies any nausea, vomiting, diarrhea, or constipation. The patient reports a long history  of migraines. He denies unusual headaches. When he was last seen, he had an eyelid infection for which he was taking doxycycline. This is better at this time. Denies rashes and skin changes except for easy bruising. He is here for evaluation and repeat blood work before starting cycle 34  MEDICAL HISTORY: Past Medical History:  Diagnosis Date   Cervical spondylolysis    Essential hypertension    GERD (gastroesophageal reflux disease)    History of kidney stones    History of migraine    Hyperlipidemia    Hypertension    PONV (postoperative nausea and vomiting)    Type 2 diabetes mellitus (HCC)     ALLERGIES:  has No Known Allergies.  MEDICATIONS:  Current Outpatient Medications  Medication Sig Dispense Refill   cholecalciferol (VITAMIN D) 1000 UNITS tablet Take 1,000 Units by mouth every evening.     Continuous Glucose Sensor (FREESTYLE LIBRE 3 SENSOR) MISC Place 1 sensor on the skin every 14 days. Use to check glucose continuously 6 each 3   dexamethasone (DECADRON) 1 MG tablet Take 1 tablet (1 mg total) by mouth daily with breakfast. 90 tablet 1   docusate sodium (COLACE) 100 MG capsule Take 100 mg by mouth daily.     folic acid (FOLVITE) 1 MG tablet Take 1 tablet by mouth daily. 90 tablet 4   Krill Oil 300 MG CAPS Take 300 mg by mouth every evening.     levothyroxine (SYNTHROID) 75 MCG tablet Take 1 tablet (75 mcg total) by mouth daily before breakfast. 90 tablet 1   loratadine (CLARITIN) 10 MG tablet Take 10 mg by mouth every evening.      losartan (COZAAR) 50 MG tablet Take 1  and 1/2 tablets (75 mg total) by mouth daily. 135 tablet 0   mirtazapine (REMERON) 15 MG tablet Take 1 tablet by mouth at bedtime. 90 tablet 0   Multiple Vitamins-Minerals (MULTIVITAMIN GUMMIES ADULT PO) Take 2 each by mouth daily.     pantoprazole (PROTONIX) 40 MG tablet Take 1 tablet by mouth every evening. 90 tablet 0   PRESCRIPTION MEDICATION Testosterone injection     prochlorperazine (COMPAZINE) 10  MG tablet Take 1 tablet (10 mg total) by mouth every 6 (six) hours as needed for nausea or vomiting. 30 tablet 0   rosuvastatin (CRESTOR) 20 MG tablet Take 1 tablet (20 mg total) by mouth daily. 90 tablet 1   Semaglutide,0.25 or 0.5MG /DOS, (OZEMPIC, 0.25 OR 0.5 MG/DOSE,) 2 MG/3ML SOPN Inject 0.5 mg into the skin once a week. 9 mL 1   SYRINGE-NEEDLE, DISP, 3 ML 21G X 1-1/2" 3 ML MISC Use to inject testosterone every week 100 each 2   testosterone cypionate (DEPOTESTOTERONE CYPIONATE) 100 MG/ML injection Take 50mg  ( 0.53ml) and 100mg  ( 1ml) into muscle alternatively weekly. 10 mL 0   venlafaxine XR (EFFEXOR-XR) 75 MG 24 hr capsule Take 1 capsule (75 mg total) by mouth daily with breakfast. 90 capsule 1   No current facility-administered medications for this visit.    SURGICAL HISTORY:  Past Surgical History:  Procedure Laterality Date   Bilateral inguinal hernia repair     BRONCHIAL NEEDLE ASPIRATION BIOPSY  04/05/2021   Procedure: BRONCHIAL NEEDLE ASPIRATION BIOPSIES;  Surgeon: Josephine Igo, DO;  Location: [facility];  Service: Pulmonary;;   COLONOSCOPY  01/23/2012   Procedure: COLONOSCOPY;  Surgeon: Corbin Ade, MD;  Location: [facility];  Service: Endoscopy;  Laterality: N/A;  9:30 AM   COLONOSCOPY N/A 10/11/2015   Procedure: COLONOSCOPY;  Surgeon: Corbin Ade, MD;  Location: [facility];  Service: Endoscopy;  Laterality: N/A;  830   COLONOSCOPY N/A 10/14/2019   Procedure: COLONOSCOPY;  Surgeon: Corbin Ade, MD;  Location: [facility];  Service: Endoscopy;  Laterality: N/A;  1:45   POLYPECTOMY  10/14/2019   Procedure: POLYPECTOMY;  Surgeon: Corbin Ade, MD;  Location: [facility];  Service: Endoscopy;;  ascending colon, descending colon   VIDEO BRONCHOSCOPY WITH ENDOBRONCHIAL ULTRASOUND N/A 04/05/2021   Procedure: VIDEO BRONCHOSCOPY WITH ENDOBRONCHIAL ULTRASOUND;  Surgeon: Josephine Igo, DO;  Location: [facility];  Service: Pulmonary;  Laterality: N/A;    REVIEW  OF SYSTEMS:   Review of Systems  Constitutional: Negative for appetite change, chills, fatigue, fever and unexpected weight change.  HENT: Negative for mouth sores, nosebleeds, sore throat and trouble swallowing.   Eyes: Negative for eye problems and icterus.  Respiratory: Negative for cough, hemoptysis, shortness of breath and wheezing.   Cardiovascular: Negative for chest pain and leg swelling.  Gastrointestinal: Negative for abdominal pain, constipation, diarrhea, nausea and vomiting.  Genitourinary: Negative for bladder incontinence, difficulty urinating, dysuria, frequency and hematuria.   Musculoskeletal: Negative for back pain, gait problem, neck pain and neck stiffness.  Skin: Negative for itching and rash.  Neurological: Negative for dizziness, extremity weakness, gait problem, headaches, light-headedness and seizures.  Hematological: Negative for adenopathy. Does not bruise/bleed easily.  Psychiatric/Behavioral: Negative for confusion, depression and sleep disturbance. The patient is not nervous/anxious.     PHYSICAL EXAMINATION:  Blood pressure 114/71, pulse 100, temperature 98.4 F (36.9 C), temperature source Oral, resp. rate 16, weight 186 lb 6.4 oz (84.6 kg), SpO2 99 %.  ECOG PERFORMANCE STATUS:  1  Physical Exam  Constitutional: Oriented to person, place, and time and well-developed, well-nourished, and in no distress. No distress.  HENT:  Head: Normocephalic and atraumatic.  Mouth/Throat: Oropharynx is clear and moist. No oropharyngeal exudate.  Eyes: Conjunctivae are normal. Right eye exhibits no discharge. Left eye exhibits no discharge. No scleral icterus.  Neck: Normal range of motion. Neck supple.  Cardiovascular: Normal rate, regular rhythm, normal heart sounds and intact distal pulses.   Pulmonary/Chest: Effort normal and breath sounds normal. No respiratory distress. No wheezes. No rales.  Abdominal: Soft. Bowel sounds are normal. Exhibits no distension and no  mass. There is no tenderness.  Musculoskeletal: Normal range of motion. Exhibits no edema.  Lymphadenopathy:    No cervical adenopathy.  Neurological: Alert and oriented to person, place, and time. Exhibits normal muscle tone. Gait normal. Coordination normal.  Skin: Skin is warm and dry. No rash noted. Not diaphoretic. No erythema. No pallor.  Psychiatric: Mood, memory and judgment normal.  Vitals reviewed.  LABORATORY DATA: Lab Results  Component Value Date   WBC 11.5 (H) 04/15/2023   HGB 11.9 (L) 04/15/2023   HCT 38.1 (L) 04/15/2023   MCV 86.0 04/15/2023   PLT 296 04/15/2023      Chemistry      Component Value Date/Time   NA 141 03/26/2023 0804   NA 144 01/10/2021 0849   K 4.3 03/26/2023 0804   CL 107 03/26/2023 0804   CO2 27 03/26/2023 0804   BUN 13 03/26/2023 0804   BUN 15 01/10/2021 0849   CREATININE 1.00 03/26/2023 0804      Component Value Date/Time   CALCIUM 9.3 03/26/2023 0804   ALKPHOS 64 03/26/2023 0804   AST 23 03/26/2023 0804   ALT 15 03/26/2023 0804   BILITOT 0.3 03/26/2023 0804       RADIOGRAPHIC STUDIES:  No results found.   ASSESSMENT/PLAN:  This is a very pleasant 67 year old Caucasian male diagnosed with stage IV (T1b, N3, M1 C) non-small cell lung cancer, favoring adenocarcinoma.  The patient presented with a right upper lobe lung nodule in addition to right hilar, subcarinal, and bilateral mediastinal lymphadenopathy as well as supraclavicular lymphadenopathy.  The patient also has metastatic disease to the brain.  He was diagnosed in June 2022.  His PD-L1 expression is 80%.  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   The patient completed SRS to the brain lesions under the care of Dr. Kathrynn Running.  His last treatment was on 05/04/2021. 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    The patient is currently  undergoing systemic chemotherapy with carboplatin for an AUC of 5, Alimta 500 mg per metered squared, Keytruda 200 mg IV every 3 weeks.  Status post 32 cycles. Starting from cycle #5, the patient started maintenance Alimta and Keytruda IV every 3 weeks.    CMP is pending. As long as these are within parameters, recommend that he proceed with cycle #34 today as scheduled.    We will see him back for a follow up in 3 weeks for evaluation and repeat blood work before starting cycle #35.    The patient was advised to call immediately if he has any concerning symptoms in the interval. The patient voices understanding of current disease status and treatment options and is in agreement with the current care plan. All questions were answered. The  patient knows to call the clinic with any problems, questions or concerns. We can certainly see the patient much sooner if necessary    No orders of the defined types were placed in this encounter.    The total time spent in the appointment was 20-29 minutes  Lynette Noah L Jammie Troup, PA-C 04/15/23

## 2023-04-10 ENCOUNTER — Other Ambulatory Visit: Payer: Self-pay

## 2023-04-10 ENCOUNTER — Other Ambulatory Visit (HOSPITAL_COMMUNITY): Payer: Self-pay

## 2023-04-15 ENCOUNTER — Inpatient Hospital Stay: Payer: Commercial Managed Care - PPO

## 2023-04-15 ENCOUNTER — Inpatient Hospital Stay: Payer: Commercial Managed Care - PPO | Admitting: Physician Assistant

## 2023-04-15 ENCOUNTER — Inpatient Hospital Stay: Payer: Commercial Managed Care - PPO | Attending: Physician Assistant

## 2023-04-15 VITALS — BP 114/71 | HR 100 | Temp 98.4°F | Resp 16 | Wt 186.4 lb

## 2023-04-15 VITALS — BP 140/84 | HR 85 | Temp 97.8°F | Resp 16

## 2023-04-15 DIAGNOSIS — Z5112 Encounter for antineoplastic immunotherapy: Secondary | ICD-10-CM | POA: Insufficient documentation

## 2023-04-15 DIAGNOSIS — Z79899 Other long term (current) drug therapy: Secondary | ICD-10-CM | POA: Diagnosis not present

## 2023-04-15 DIAGNOSIS — Z5111 Encounter for antineoplastic chemotherapy: Secondary | ICD-10-CM | POA: Diagnosis not present

## 2023-04-15 DIAGNOSIS — C3491 Malignant neoplasm of unspecified part of right bronchus or lung: Secondary | ICD-10-CM

## 2023-04-15 DIAGNOSIS — Z79620 Long term (current) use of immunosuppressive biologic: Secondary | ICD-10-CM | POA: Insufficient documentation

## 2023-04-15 DIAGNOSIS — C7931 Secondary malignant neoplasm of brain: Secondary | ICD-10-CM | POA: Diagnosis not present

## 2023-04-15 DIAGNOSIS — F17210 Nicotine dependence, cigarettes, uncomplicated: Secondary | ICD-10-CM | POA: Insufficient documentation

## 2023-04-15 DIAGNOSIS — C7951 Secondary malignant neoplasm of bone: Secondary | ICD-10-CM | POA: Insufficient documentation

## 2023-04-15 DIAGNOSIS — E230 Hypopituitarism: Secondary | ICD-10-CM | POA: Insufficient documentation

## 2023-04-15 DIAGNOSIS — C3411 Malignant neoplasm of upper lobe, right bronchus or lung: Secondary | ICD-10-CM | POA: Insufficient documentation

## 2023-04-15 LAB — CBC WITH DIFFERENTIAL (CANCER CENTER ONLY)
Abs Immature Granulocytes: 0.05 10*3/uL (ref 0.00–0.07)
Basophils Absolute: 0.1 10*3/uL (ref 0.0–0.1)
Basophils Relative: 1 %
Eosinophils Absolute: 0 10*3/uL (ref 0.0–0.5)
Eosinophils Relative: 0 %
HCT: 38.1 % — ABNORMAL LOW (ref 39.0–52.0)
Hemoglobin: 11.9 g/dL — ABNORMAL LOW (ref 13.0–17.0)
Immature Granulocytes: 0 %
Lymphocytes Relative: 11 %
Lymphs Abs: 1.3 10*3/uL (ref 0.7–4.0)
MCH: 26.9 pg (ref 26.0–34.0)
MCHC: 31.2 g/dL (ref 30.0–36.0)
MCV: 86 fL (ref 80.0–100.0)
Monocytes Absolute: 0.4 10*3/uL (ref 0.1–1.0)
Monocytes Relative: 4 %
Neutro Abs: 9.6 10*3/uL — ABNORMAL HIGH (ref 1.7–7.7)
Neutrophils Relative %: 84 %
Platelet Count: 296 10*3/uL (ref 150–400)
RBC: 4.43 MIL/uL (ref 4.22–5.81)
RDW: 19.5 % — ABNORMAL HIGH (ref 11.5–15.5)
WBC Count: 11.5 10*3/uL — ABNORMAL HIGH (ref 4.0–10.5)
nRBC: 0 % (ref 0.0–0.2)

## 2023-04-15 LAB — TSH: TSH: 0.468 u[IU]/mL (ref 0.350–4.500)

## 2023-04-15 LAB — CMP (CANCER CENTER ONLY)
ALT: 16 U/L (ref 0–44)
AST: 19 U/L (ref 15–41)
Albumin: 3.6 g/dL (ref 3.5–5.0)
Alkaline Phosphatase: 57 U/L (ref 38–126)
Anion gap: 6 (ref 5–15)
BUN: 13 mg/dL (ref 8–23)
CO2: 25 mmol/L (ref 22–32)
Calcium: 8.9 mg/dL (ref 8.9–10.3)
Chloride: 108 mmol/L (ref 98–111)
Creatinine: 1.02 mg/dL (ref 0.61–1.24)
GFR, Estimated: >60 mL/min (ref >60)
Glucose, Bld: 117 mg/dL — ABNORMAL HIGH (ref 70–99)
Potassium: 3.9 mmol/L (ref 3.5–5.1)
Sodium: 139 mmol/L (ref 135–145)
Total Bilirubin: 0.3 mg/dL (ref 0.3–1.2)
Total Protein: 6.6 g/dL (ref 6.5–8.1)

## 2023-04-15 MED ORDER — SODIUM CHLORIDE 0.9 % IV SOLN: Freq: Once | INTRAVENOUS | Status: AC

## 2023-04-15 MED ORDER — SODIUM CHLORIDE 0.9 % IV SOLN
500.0000 mg/m2 | Freq: Once | INTRAVENOUS | Status: AC
  Administered 2023-04-15: 1000 mg via INTRAVENOUS
  Filled 2023-04-15: qty 40

## 2023-04-15 MED ORDER — PROCHLORPERAZINE MALEATE 10 MG PO TABS
10.0000 mg | ORAL_TABLET | Freq: Once | ORAL | Status: AC
  Administered 2023-04-15: 10 mg via ORAL
  Filled 2023-04-15: qty 1

## 2023-04-15 MED ORDER — SODIUM CHLORIDE 0.9 % IV SOLN
200.0000 mg | Freq: Once | INTRAVENOUS | Status: AC
  Administered 2023-04-15: 200 mg via INTRAVENOUS
  Filled 2023-04-15: qty 200

## 2023-04-15 NOTE — Patient Instructions (Signed)
[facility]   Discharge Instructions: Thank you for choosing [facility] to provide your oncology and hematology care.   If you have a lab appointment with the Cancer Center, please go directly to the Cancer Center and check in at the registration area.   Wear comfortable clothing and clothing appropriate for easy access to any Portacath or PICC line.   We strive to give you quality time with your provider. You may need to reschedule your appointment if you arrive late (15 or more minutes).  Arriving late affects you and other patients whose appointments are after yours.  Also, if you miss three or more appointments without notifying the office, you may be dismissed from the clinic at the provider's discretion.      For prescription refill requests, have your pharmacy contact our office and allow 72 hours for refills to be completed.    Today you received the following chemotherapy and/or immunotherapy agents: Pembrolizumab (Keytruda) and Pemetrexed (Alimta)      To help prevent nausea and vomiting after your treatment, we encourage you to take your nausea medication as directed.  BELOW ARE SYMPTOMS THAT SHOULD BE REPORTED IMMEDIATELY: *FEVER GREATER THAN 100.4 F (38 C) OR HIGHER *CHILLS OR SWEATING *NAUSEA AND VOMITING THAT IS NOT CONTROLLED WITH YOUR NAUSEA MEDICATION *UNUSUAL SHORTNESS OF BREATH *UNUSUAL BRUISING OR BLEEDING *URINARY PROBLEMS (pain or burning when urinating, or frequent urination) *BOWEL PROBLEMS (unusual diarrhea, constipation, pain near the anus) TENDERNESS IN MOUTH AND THROAT WITH OR WITHOUT PRESENCE OF ULCERS (sore throat, sores in mouth, or a toothache) UNUSUAL RASH, SWELLING OR PAIN  UNUSUAL VAGINAL DISCHARGE OR ITCHING   Items with * indicate a potential emergency and should be followed up as soon as possible or go to the Emergency Department if any problems should occur.  Please show the CHEMOTHERAPY ALERT  CARD or IMMUNOTHERAPY ALERT CARD at check-in to the Emergency Department and triage nurse.  Should you have questions after your visit or need to cancel or reschedule your appointment, please contact [facility]  Dept: [phone]  and follow the prompts.  Office hours are 8:00 a.m. to 4:30 p.m. Monday - Friday. Please note that voicemails left after 4:00 p.m. may not be returned until the following business day.  We are closed weekends and major holidays. You have access to a nurse at all times for urgent questions. Please call the main number to the clinic Dept: [phone] and follow the prompts.   For any non-urgent questions, you may also contact your provider using MyChart. We now offer e-Visits for anyone 18 and older to request care online for non-urgent symptoms. For details visit [email or web address].   Also download the MyChart app! Go to the app store, search "MyChart", open the app, select [facility], and log in with your MyChart username and password. 

## 2023-04-16 ENCOUNTER — Ambulatory Visit: Payer: Commercial Managed Care - PPO | Admitting: Physician Assistant

## 2023-04-16 ENCOUNTER — Encounter: Payer: Self-pay | Admitting: Family Medicine

## 2023-04-16 ENCOUNTER — Other Ambulatory Visit: Payer: Commercial Managed Care - PPO

## 2023-04-16 ENCOUNTER — Other Ambulatory Visit: Payer: Self-pay

## 2023-04-16 ENCOUNTER — Other Ambulatory Visit (HOSPITAL_COMMUNITY): Payer: Self-pay

## 2023-04-16 ENCOUNTER — Ambulatory Visit: Payer: Commercial Managed Care - PPO | Admitting: Family Medicine

## 2023-04-16 ENCOUNTER — Ambulatory Visit: Payer: Commercial Managed Care - PPO

## 2023-04-16 VITALS — BP 128/86 | HR 103 | Temp 97.6°F | Ht 71.0 in | Wt 188.2 lb

## 2023-04-16 DIAGNOSIS — F3340 Major depressive disorder, recurrent, in remission, unspecified: Secondary | ICD-10-CM

## 2023-04-16 DIAGNOSIS — E1159 Type 2 diabetes mellitus with other circulatory complications: Secondary | ICD-10-CM

## 2023-04-16 DIAGNOSIS — K219 Gastro-esophageal reflux disease without esophagitis: Secondary | ICD-10-CM | POA: Diagnosis not present

## 2023-04-16 DIAGNOSIS — E2740 Unspecified adrenocortical insufficiency: Secondary | ICD-10-CM | POA: Diagnosis not present

## 2023-04-16 DIAGNOSIS — I152 Hypertension secondary to endocrine disorders: Secondary | ICD-10-CM

## 2023-04-16 DIAGNOSIS — E1165 Type 2 diabetes mellitus with hyperglycemia: Secondary | ICD-10-CM | POA: Diagnosis not present

## 2023-04-16 DIAGNOSIS — R630 Anorexia: Secondary | ICD-10-CM | POA: Diagnosis not present

## 2023-04-16 DIAGNOSIS — G479 Sleep disorder, unspecified: Secondary | ICD-10-CM | POA: Diagnosis not present

## 2023-04-16 DIAGNOSIS — E039 Hypothyroidism, unspecified: Secondary | ICD-10-CM

## 2023-04-16 LAB — T4: T4, Total: 6.8 ug/dL (ref 4.5–12.0)

## 2023-04-16 LAB — BAYER DCA HB A1C WAIVED: HB A1C (BAYER DCA - WAIVED): 6 % — ABNORMAL HIGH (ref 4.8–5.6)

## 2023-04-16 MED ORDER — LOSARTAN POTASSIUM 50 MG PO TABS
75.0000 mg | ORAL_TABLET | Freq: Every day | ORAL | 1 refills | Status: DC
Start: 2023-04-16 — End: 2023-12-12
  Filled 2023-04-16 – 2023-06-14 (×2): qty 135, 90d supply, fill #0
  Filled 2023-09-04: qty 135, 90d supply, fill #1

## 2023-04-16 MED ORDER — PANTOPRAZOLE SODIUM 40 MG PO TBEC
40.0000 mg | DELAYED_RELEASE_TABLET | Freq: Every evening | ORAL | 1 refills | Status: DC
Start: 2023-04-16 — End: 2023-10-24
  Filled 2023-04-16: qty 90, 90d supply, fill #0
  Filled 2023-07-23: qty 90, 90d supply, fill #1

## 2023-04-16 MED ORDER — MIRTAZAPINE 15 MG PO TABS
15.0000 mg | ORAL_TABLET | Freq: Every day | ORAL | 1 refills | Status: DC
Start: 2023-04-16 — End: 2023-10-27
  Filled 2023-04-16: qty 90, 90d supply, fill #0
  Filled 2023-07-23: qty 90, 90d supply, fill #1

## 2023-04-16 MED ORDER — DEXAMETHASONE 1 MG PO TABS
1.0000 mg | ORAL_TABLET | Freq: Every day | ORAL | 1 refills | Status: DC
  Filled 2023-04-16 – 2023-06-14 (×2): qty 90, 90d supply, fill #0
  Filled 2023-09-15: qty 90, 90d supply, fill #1

## 2023-04-16 MED ORDER — LEVOTHYROXINE SODIUM 75 MCG PO TABS
75.0000 ug | ORAL_TABLET | Freq: Every day | ORAL | 1 refills | Status: DC
Start: 2023-04-16 — End: 2023-05-07
  Filled 2023-04-16: qty 90, 90d supply, fill #0

## 2023-04-16 NOTE — Progress Notes (Signed)
Subjective:  Patient ID: Bradley Goodman, male    DOB: 11/09/1955, 67 y.o.   MRN: [id number]  Patient Care Team: [name], FNP as PCP - General (Family Medicine) Jena Gauss Gerrit Friends, MD as Consulting Physician (Gastroenterology) Delora Fuel, OD (Optometry)   Chief Complaint:  Medical Management of Chronic Issues (6 month chronic follow up )   HPI: Bradley Goodman is a 67 y.o. male presenting on 04/16/2023 for Medical Management of Chronic Issues (6 month chronic follow up )    1. Type 2 diabetes mellitus with hyperglycemia, without long-term current use of insulin (HCC) Currently on Ozempic and tolerating well. Denies polyuria, polyphagia, or polydipsia. Blood sugars have been good, some low readings but this only lasted for a few minutes. No associated hypoglycemic symptoms. Has been watching his diet and has done well with this.   2. Hypertension associated with type 2 diabetes mellitus (HCC) Complaint with meds - Yes Current Medications - losartan  Checking BP at home - no Exercising Regularly - No Watching Salt intake - Yes Pertinent ROS:  Headache - No Fatigue - Yes Visual Disturbances - No Chest pain - No Dyspnea - No Palpitations - No LE edema - Yes They report good compliance with medications and can restate their regimen by memory. No medication side effects.  BP Readings from Last 3 Encounters:  04/16/23 128/86  04/15/23 (!) 140/84  04/15/23 114/71     3. Recurrent major depression in remission [facility]) Taking medications as prescribed and reports he is tolerating well. No SI or HI.     04/16/2023    9:04 AM 10/16/2022   10:20 AM 07/31/2022    8:48 AM 07/31/2022    8:47 AM 04/17/2022    9:41 AM  Depression screen PHQ 2/9  Decreased Interest 1 2  1  0  Down, Depressed, Hopeless 0 0  0 0  PHQ - 2 Score 1 2  1  0  Altered sleeping 0 0 0  2  Tired, decreased energy 2 2 1   0  Change in appetite 0 0 0  0  Feeling bad or failure about yourself  0 0 0  0   Trouble concentrating 0 0 0  0  Moving slowly or fidgety/restless 0 0 0  2  Suicidal thoughts 0 0 0  0  PHQ-9 Score 3 4   4   Difficult doing work/chores Not difficult at all Not difficult at all Not difficult at all  Not difficult at all      04/16/2023    9:04 AM 10/16/2022   10:20 AM 07/31/2022    8:47 AM 04/17/2022    9:41 AM  GAD 7 : Generalized Anxiety Score  Nervous, Anxious, on Edge 0 0 0 0  Control/stop worrying 0 0 0 0  Worry too much - different things 0 0 0 0  Trouble relaxing 0 0 0 0  Restless 0 0 0 0  Easily annoyed or irritable 1 0 1 1  Afraid - awful might happen 0 0 0 0  Total GAD 7 Score 1 0 1 1  Anxiety Difficulty Not difficult at all Not difficult at all Not difficult at all Not difficult at all      4. 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 Remeron helps. Has been maintaining weight well. Has been eating better over the last several months.   5. Difficulty sleeping Does well with medications. Tries to avoid stimulants.   6. Gastroesophageal reflux disease  without esophagitis Compliant with medications - Yes Current medications - Protonix Adverse side effects - No Cough - No Sore throat - No Voice change - No Hemoptysis - No Dysphagia or dyspepsia - No Water brash - No Red Flags (weight loss, hematochezia, melena, weight loss, early satiety, fevers, odynophagia, or persistent vomiting) - No   7. Acquired hypothyroidism 8. Adrenal insufficiency (HCC) He is followed by endocrinology on a regular basis who manages his thyroid and adrenal disease.     Relevant past medical, surgical, family, and social history reviewed and updated as indicated.  Allergies and medications reviewed and updated. Data reviewed: Chart in Epic.   Past Medical History:  Diagnosis Date   Cervical spondylolysis    Essential hypertension    GERD (gastroesophageal reflux disease)    History of kidney stones    History of migraine    Hyperlipidemia     Hypertension    PONV (postoperative nausea and vomiting)    Type 2 diabetes mellitus (HCC)     Past Surgical History:  Procedure Laterality Date   Bilateral inguinal hernia repair     BRONCHIAL NEEDLE ASPIRATION BIOPSY  04/05/2021   Procedure: BRONCHIAL NEEDLE ASPIRATION BIOPSIES;  Surgeon: Josephine Igo, DO;  Location: [facility];  Service: Pulmonary;;   COLONOSCOPY  01/23/2012   Procedure: COLONOSCOPY;  Surgeon: Corbin Ade, MD;  Location: [facility];  Service: Endoscopy;  Laterality: N/A;  9:30 AM   COLONOSCOPY N/A 10/11/2015   Procedure: COLONOSCOPY;  Surgeon: Corbin Ade, MD;  Location: [facility];  Service: Endoscopy;  Laterality: N/A;  830   COLONOSCOPY N/A 10/14/2019   Procedure: COLONOSCOPY;  Surgeon: Corbin Ade, MD;  Location: [facility];  Service: Endoscopy;  Laterality: N/A;  1:45   POLYPECTOMY  10/14/2019   Procedure: POLYPECTOMY;  Surgeon: Corbin Ade, MD;  Location: [facility];  Service: Endoscopy;;  ascending colon, descending colon   VIDEO BRONCHOSCOPY WITH ENDOBRONCHIAL ULTRASOUND N/A 04/05/2021   Procedure: VIDEO BRONCHOSCOPY WITH ENDOBRONCHIAL ULTRASOUND;  Surgeon: Josephine Igo, DO;  Location: [facility];  Service: Pulmonary;  Laterality: N/A;    Social History   Socioeconomic History   Marital status: Married    Spouse name: Not on file   Number of children: Not on file   Years of education: Not on file   Highest education level: Bachelor's degree (e.g., BA, AB, BS)  Occupational History   Not on file  Tobacco Use   Smoking status: Every Day    Packs/day: 0.50    Years: 30.00    Additional pack years: 0.00    Total pack years: 15.00    Types: Cigarettes    Start date: 10/30/1973   Smokeless tobacco: Never  Vaping Use   Vaping Use: Never used  Substance and Sexual Activity   Alcohol use: Not Currently    Comment: One drink every 6 months.   Drug use: No   Sexual activity: Yes  Other Topics Concern   Not on file  Social  History Narrative   Not on file   Social Determinants of Health   Financial Resource Strain: Patient Declined (04/15/2023)   Overall Financial Resource Strain (CARDIA)    Difficulty of Paying Living Expenses: Patient declined  Food Insecurity: No Food Insecurity (04/15/2023)   Hunger Vital Sign    Worried About Running Out of Food in the Last Year: Never true    Ran Out of Food in the Last Year: Never true  Transportation Needs: No Transportation Needs (04/15/2023)   PRAPARE - Administrator, Civil Service (Medical): No    Lack of Transportation (Non-Medical): No  Physical Activity: Unknown (04/15/2023)   Exercise Vital Sign    Days of Exercise per Week: 1 day    Minutes of Exercise per Session: Patient declined  Stress: No Stress Concern Present (04/15/2023)   [employer] of Occupational Health - Occupational Stress Questionnaire    Feeling of Stress : Only a little  Social Connections: Unknown (04/15/2023)   Social Connection and Isolation Panel [NHANES]    Frequency of Communication with Friends and Family: Patient declined    Frequency of Social Gatherings with Friends and Family: Patient declined    Attends Religious Services: Not on Insurance claims handler of Clubs or Organizations: No    Attends Banker Meetings: Not on file    Marital Status: Married  Intimate Partner Violence: Not on file    Outpatient Encounter Medications as of 04/16/2023  Medication Sig   cholecalciferol (VITAMIN D) 1000 UNITS tablet Take 1,000 Units by mouth every evening.   Continuous Glucose Sensor (FREESTYLE LIBRE 3 SENSOR) MISC Place 1 sensor on the skin every 14 days. Use to check glucose continuously   docusate sodium (COLACE) 100 MG capsule Take 100 mg by mouth daily.   folic acid (FOLVITE) 1 MG tablet Take 1 tablet by mouth daily.   Krill Oil 300 MG CAPS Take 300 mg by mouth every evening.   loratadine (CLARITIN) 10 MG tablet Take 10 mg by mouth every evening.    Multiple  Vitamins-Minerals (MULTIVITAMIN GUMMIES ADULT PO) Take 2 each by mouth daily.   PRESCRIPTION MEDICATION Testosterone injection   prochlorperazine (COMPAZINE) 10 MG tablet Take 1 tablet (10 mg total) by mouth every 6 (six) hours as needed for nausea or vomiting.   rosuvastatin (CRESTOR) 20 MG tablet Take 1 tablet (20 mg total) by mouth daily.   Semaglutide,0.25 or 0.5MG /DOS, (OZEMPIC, 0.25 OR 0.5 MG/DOSE,) 2 MG/3ML SOPN Inject 0.5 mg into the skin once a week.   SYRINGE-NEEDLE, DISP, 3 ML 21G X 1-1/2" 3 ML MISC Use to inject testosterone every week   testosterone cypionate (DEPOTESTOTERONE CYPIONATE) 100 MG/ML injection Take 50mg  ( 0.18ml) and 100mg  ( 1ml) into muscle alternatively weekly.   venlafaxine XR (EFFEXOR-XR) 75 MG 24 hr capsule Take 1 capsule (75 mg total) by mouth daily with breakfast.   [DISCONTINUED] dexamethasone (DECADRON) 1 MG tablet Take 1 tablet (1 mg total) by mouth daily with breakfast.   [DISCONTINUED] levothyroxine (SYNTHROID) 75 MCG tablet Take 1 tablet (75 mcg total) by mouth daily before breakfast.   [DISCONTINUED] losartan (COZAAR) 50 MG tablet Take 1 and 1/2 tablets (75 mg total) by mouth daily.   [DISCONTINUED] mirtazapine (REMERON) 15 MG tablet Take 1 tablet by mouth at bedtime.   [DISCONTINUED] pantoprazole (PROTONIX) 40 MG tablet Take 1 tablet by mouth every evening.   dexamethasone (DECADRON) 1 MG tablet Take 1 tablet (1 mg total) by mouth daily with breakfast.   levothyroxine (SYNTHROID) 75 MCG tablet Take 1 tablet (75 mcg total) by mouth daily before breakfast.   losartan (COZAAR) 50 MG tablet Take 1.5 tablets (75 mg total) by mouth daily.   mirtazapine (REMERON) 15 MG tablet Take 1 tablet (15 mg total) by mouth at bedtime.   pantoprazole (PROTONIX) 40 MG tablet Take 1 tablet (40 mg total) by mouth every evening.   No facility-administered encounter medications on file as  of 04/16/2023.    No Known Allergies  Review of Systems  Constitutional:  Positive for  activity change, appetite change and fatigue. Negative for chills, diaphoresis, fever and unexpected weight change.  HENT: Negative.    Eyes: Negative.   Respiratory:  Negative for cough, chest tightness and shortness of breath.   Cardiovascular:  Positive for leg swelling. Negative for chest pain and palpitations.  Gastrointestinal:  Negative for abdominal pain, blood in stool, constipation, diarrhea, nausea and vomiting.  Endocrine: Negative.  Negative for polydipsia, polyphagia and polyuria.  Genitourinary:  Negative for decreased urine volume, difficulty urinating, dysuria, frequency and urgency.  Musculoskeletal:  Negative for arthralgias and myalgias.  Skin: Negative.   Allergic/Immunologic: Negative.   Neurological:  Negative for dizziness, tremors, seizures, syncope, facial asymmetry, speech difficulty, weakness, light-headedness, numbness and headaches.  Hematological: Negative.   Psychiatric/Behavioral:  Positive for agitation and sleep disturbance. Negative for behavioral problems, confusion, decreased concentration, dysphoric mood, hallucinations, self-injury and suicidal ideas. The patient is not nervous/anxious and is not hyperactive.   All other systems reviewed and are negative.       Objective:  BP 128/86   Pulse (!) 103   Temp 97.6 F (36.4 C) (Temporal)   Ht 5\' 11"  (1.803 m)   Wt 188 lb 3.2 oz (85.4 kg)   SpO2 97%   BMI 26.25 kg/m    Wt Readings from Last 3 Encounters:  04/16/23 188 lb 3.2 oz (85.4 kg)  04/15/23 186 lb 6.4 oz (84.6 kg)  03/26/23 187 lb 12.8 oz (85.2 kg)    Physical Exam Vitals and nursing note reviewed.  Constitutional:      General: He is not in acute distress.    Appearance: Normal appearance. He is well-developed and well-groomed. He is not ill-appearing, toxic-appearing or diaphoretic.  HENT:     Head: Normocephalic and atraumatic.     Jaw: There is normal jaw occlusion.     Right Ear: Hearing normal.     Left Ear: Hearing normal.      Nose: Nose normal.     Mouth/Throat:     Lips: Pink.     Mouth: Mucous membranes are moist.     Pharynx: Uvula midline.  Eyes:     General: Lids are normal.     Conjunctiva/sclera: Conjunctivae normal.     Pupils: Pupils are equal, round, and reactive to light.  Neck:     Thyroid: No thyroid mass, thyromegaly or thyroid tenderness.     Vascular: No carotid bruit or JVD.     Trachea: Trachea and phonation normal.  Cardiovascular:     Rate and Rhythm: Normal rate and regular rhythm.     Chest Wall: PMI is not displaced.     Pulses: Normal pulses.          Dorsalis pedis pulses are 2+ on the right side and 2+ on the left side.       Posterior tibial pulses are 2+ on the right side and 2+ on the left side.     Heart sounds: Normal heart sounds. No murmur heard.    No friction rub. No gallop.  Pulmonary:     Effort: Pulmonary effort is normal.     Breath sounds: Normal breath sounds.  Abdominal:     General: Bowel sounds are normal.     Palpations: Abdomen is soft.  Musculoskeletal:        General: Normal range of motion.     Cervical back: Normal range  of motion and neck supple.     Right lower leg: Edema present.     Left lower leg: Edema present.     Right foot: Normal range of motion. No deformity, bunion, Charcot foot, foot drop or prominent metatarsal heads.     Left foot: Normal range of motion. No deformity, bunion, Charcot foot, foot drop or prominent metatarsal heads.  Feet:     Right foot:     Protective Sensation: 10 sites tested.  10 sites sensed.     Skin integrity: Skin integrity normal.     Left foot:     Protective Sensation: 10 sites tested.  10 sites sensed.     Skin integrity: Skin integrity normal.  Lymphadenopathy:     Cervical: No cervical adenopathy.  Skin:    General: Skin is warm and dry.     Capillary Refill: Capillary refill takes less than 2 seconds.     Coloration: Skin is not cyanotic, jaundiced or pale.     Findings: No rash.   Neurological:     General: No focal deficit present.     Mental Status: He is alert and oriented to person, place, and time.     Sensory: Sensation is intact.     Motor: Motor function is intact.     Coordination: Coordination is intact.     Gait: Gait is intact.     Deep Tendon Reflexes: Reflexes are normal and symmetric.  Psychiatric:        Attention and Perception: Attention and perception normal.        Mood and Affect: Mood and affect normal.        Speech: Speech normal.        Behavior: Behavior normal. Behavior is cooperative.        Thought Content: Thought content normal.        Cognition and Memory: Cognition and memory normal.        Judgment: Judgment normal.     Results for orders placed or performed in visit on 04/16/23  Bayer DCA Hb A1c Waived  Result Value Ref Range   HB A1C (BAYER DCA - WAIVED) 6.0 (H) 4.8 - 5.6 %       Pertinent labs & imaging results that were available during my care of the patient were reviewed by me and considered in my medical decision making.  Assessment & Plan:  Andras was seen today for medical management of chronic issues.  Diagnoses and all orders for this visit:  Type 2 diabetes mellitus with hyperglycemia, without long-term current use of insulin (HCC) A1C 6.0, continue current regimen. Diet and exercise encouraged.  -     Bayer DCA Hb A1c Waived -     Microalbumin / creatinine urine ratio  Hypertension associated with type 2 diabetes mellitus (HCC) BP well controlled. Changes were not made in regimen today. Goal BP is 130/80. Pt aware to report any persistent high or low readings. DASH diet and exercise encouraged. Exercise at least 150 minutes per week and increase as tolerated. Goal BMI > 25. Stress management encouraged. Avoid nicotine and tobacco product use. Avoid excessive alcohol and NSAID's. Avoid more than 2000 mg of sodium daily. Medications as prescribed. Follow up as scheduled.  -     losartan (COZAAR) 50 MG  tablet; Take 1.5 tablets (75 mg total) by mouth daily.  Recurrent major depression in remission [facility]) Doing well on below, will continue.  -     mirtazapine (REMERON) 15  MG tablet; Take 1 tablet (15 mg total) by mouth at bedtime.  Decreased appetite Doing well on below, will continue.  -     mirtazapine (REMERON) 15 MG tablet; Take 1 tablet (15 mg total) by mouth at bedtime.  Difficulty sleeping Doing well on below, will continue.  -     mirtazapine (REMERON) 15 MG tablet; Take 1 tablet (15 mg total) by mouth at bedtime.  Gastroesophageal reflux disease without esophagitis No red flags present. Diet discussed. Avoid fried, spicy, fatty, greasy, and acidic foods. Avoid caffeine, nicotine, and alcohol. Do not eat 2-3 hours before bedtime and stay upright for at least 1-2 hours after eating. Eat small frequent meals. Avoid NSAID's like motrin and aleve. Medications as prescribed. Report any new or worsening symptoms. Follow up as discussed or sooner if needed.   -     pantoprazole (PROTONIX) 40 MG tablet; Take 1 tablet (40 mg total) by mouth every evening.  Acquired hypothyroidism Needs refills today, followed by endocrinology who manages dose adjustments  -     levothyroxine (SYNTHROID) 75 MCG tablet; Take 1 tablet (75 mcg total) by mouth daily before breakfast.  Adrenal insufficiency (HCC) Needs refills today. Followed by endocrinology who manages dose adjustments.  -     dexamethasone (DECADRON) 1 MG tablet; Take 1 tablet (1 mg total) by mouth daily with breakfast.     Continue all other maintenance medications.  Follow up plan: Return in about 6 months (around 10/17/2023) for CPE.   Continue healthy lifestyle choices, including diet (rich in fruits, vegetables, and lean proteins, and low in salt and simple carbohydrates) and exercise (at least 30 minutes of moderate physical activity daily).  Educational handout given for DM  The above assessment and management plan was discussed  with the patient. The patient verbalized understanding of and has agreed to the management plan. Patient is aware to call the clinic if they develop any new symptoms or if symptoms persist or worsen. Patient is aware when to return to the clinic for a follow-up visit. Patient educated on when it is appropriate to go to the emergency department.   Kari Baars, FNP-C [facility] [phone]

## 2023-04-16 NOTE — Patient Instructions (Addendum)

## 2023-04-17 ENCOUNTER — Other Ambulatory Visit: Payer: Self-pay

## 2023-04-18 ENCOUNTER — Other Ambulatory Visit: Payer: Self-pay

## 2023-04-19 ENCOUNTER — Telehealth: Payer: Self-pay | Admitting: Internal Medicine

## 2023-04-19 NOTE — Telephone Encounter (Signed)
Called patient regarding July/August and November appointments, left a voicemail.

## 2023-04-23 ENCOUNTER — Other Ambulatory Visit: Payer: Self-pay | Admitting: Family Medicine

## 2023-04-23 ENCOUNTER — Other Ambulatory Visit (HOSPITAL_COMMUNITY): Payer: Self-pay

## 2023-04-23 ENCOUNTER — Other Ambulatory Visit: Payer: Self-pay

## 2023-04-23 DIAGNOSIS — E1169 Type 2 diabetes mellitus with other specified complication: Secondary | ICD-10-CM

## 2023-04-23 DIAGNOSIS — F3340 Major depressive disorder, recurrent, in remission, unspecified: Secondary | ICD-10-CM

## 2023-04-23 MED ORDER — VENLAFAXINE HCL ER 75 MG PO CP24
75.0000 mg | ORAL_CAPSULE | Freq: Every day | ORAL | 0 refills | Status: DC
Start: 2023-04-23 — End: 2023-07-15
  Filled 2023-04-23: qty 90, 90d supply, fill #0

## 2023-04-23 MED ORDER — ROSUVASTATIN CALCIUM 20 MG PO TABS
20.0000 mg | ORAL_TABLET | Freq: Every day | ORAL | 0 refills | Status: DC
Start: 2023-04-23 — End: 2023-07-15
  Filled 2023-04-23: qty 90, 90d supply, fill #0

## 2023-04-24 ENCOUNTER — Other Ambulatory Visit (HOSPITAL_COMMUNITY): Payer: Self-pay

## 2023-04-29 ENCOUNTER — Other Ambulatory Visit (HOSPITAL_COMMUNITY)
Admission: RE | Admit: 2023-04-29 | Discharge: 2023-04-29 | Disposition: A | Discharge status: Home / Self Care | Payer: Commercial Managed Care - PPO | Attending: "Endocrinology | Admitting: "Endocrinology

## 2023-04-29 DIAGNOSIS — C3491 Malignant neoplasm of unspecified part of right bronchus or lung: Secondary | ICD-10-CM | POA: Insufficient documentation

## 2023-04-29 DIAGNOSIS — E1165 Type 2 diabetes mellitus with hyperglycemia: Secondary | ICD-10-CM | POA: Diagnosis not present

## 2023-04-29 LAB — T4, FREE: Free T4: 0.81 ng/dL (ref 0.61–1.12)

## 2023-04-29 LAB — TSH: TSH: 0.768 u[IU]/mL (ref 0.350–4.500)

## 2023-04-30 ENCOUNTER — Encounter: Payer: Self-pay | Admitting: *Deleted

## 2023-05-01 LAB — TESTOSTERONE,FREE AND TOTAL
Testosterone, Free: 5.2 pg/mL — ABNORMAL LOW (ref 6.6–18.1)
Testosterone: 502 ng/dL (ref 264–916)

## 2023-05-06 ENCOUNTER — Other Ambulatory Visit: Payer: Self-pay

## 2023-05-06 ENCOUNTER — Other Ambulatory Visit (HOSPITAL_COMMUNITY): Payer: Self-pay

## 2023-05-06 ENCOUNTER — Inpatient Hospital Stay: Payer: Commercial Managed Care - PPO

## 2023-05-06 ENCOUNTER — Inpatient Hospital Stay (HOSPITAL_BASED_OUTPATIENT_CLINIC_OR_DEPARTMENT_OTHER): Payer: Commercial Managed Care - PPO | Admitting: Internal Medicine

## 2023-05-06 VITALS — BP 138/85 | HR 92 | Temp 98.5°F | Resp 18 | Ht 71.0 in | Wt 187.0 lb

## 2023-05-06 DIAGNOSIS — C3491 Malignant neoplasm of unspecified part of right bronchus or lung: Secondary | ICD-10-CM

## 2023-05-06 DIAGNOSIS — E230 Hypopituitarism: Secondary | ICD-10-CM | POA: Diagnosis not present

## 2023-05-06 DIAGNOSIS — C7951 Secondary malignant neoplasm of bone: Secondary | ICD-10-CM | POA: Diagnosis not present

## 2023-05-06 DIAGNOSIS — C3490 Malignant neoplasm of unspecified part of unspecified bronchus or lung: Secondary | ICD-10-CM

## 2023-05-06 DIAGNOSIS — Z79899 Other long term (current) drug therapy: Secondary | ICD-10-CM | POA: Diagnosis not present

## 2023-05-06 DIAGNOSIS — Z5112 Encounter for antineoplastic immunotherapy: Secondary | ICD-10-CM | POA: Diagnosis not present

## 2023-05-06 DIAGNOSIS — C7931 Secondary malignant neoplasm of brain: Secondary | ICD-10-CM | POA: Diagnosis not present

## 2023-05-06 DIAGNOSIS — Z5111 Encounter for antineoplastic chemotherapy: Secondary | ICD-10-CM | POA: Diagnosis not present

## 2023-05-06 DIAGNOSIS — F17210 Nicotine dependence, cigarettes, uncomplicated: Secondary | ICD-10-CM | POA: Diagnosis not present

## 2023-05-06 DIAGNOSIS — Z79620 Long term (current) use of immunosuppressive biologic: Secondary | ICD-10-CM | POA: Diagnosis not present

## 2023-05-06 DIAGNOSIS — C3411 Malignant neoplasm of upper lobe, right bronchus or lung: Secondary | ICD-10-CM | POA: Diagnosis not present

## 2023-05-06 LAB — CMP (CANCER CENTER ONLY)
ALT: 13 U/L (ref 0–44)
AST: 18 U/L (ref 15–41)
Albumin: 3.8 g/dL (ref 3.5–5.0)
Alkaline Phosphatase: 62 U/L (ref 38–126)
Anion gap: 6 (ref 5–15)
BUN: 12 mg/dL (ref 8–23)
CO2: 26 mmol/L (ref 22–32)
Calcium: 8.9 mg/dL (ref 8.9–10.3)
Chloride: 109 mmol/L (ref 98–111)
Creatinine: 1.01 mg/dL (ref 0.61–1.24)
GFR, Estimated: >60 mL/min (ref >60)
Glucose, Bld: 96 mg/dL (ref 70–99)
Potassium: 3.7 mmol/L (ref 3.5–5.1)
Sodium: 141 mmol/L (ref 135–145)
Total Bilirubin: 0.2 mg/dL — ABNORMAL LOW (ref 0.3–1.2)
Total Protein: 6.2 g/dL — ABNORMAL LOW (ref 6.5–8.1)

## 2023-05-06 LAB — CBC WITH DIFFERENTIAL (CANCER CENTER ONLY)
Abs Immature Granulocytes: 0.03 10*3/uL (ref 0.00–0.07)
Basophils Absolute: 0.1 10*3/uL (ref 0.0–0.1)
Basophils Relative: 1 %
Eosinophils Absolute: 0.1 10*3/uL (ref 0.0–0.5)
Eosinophils Relative: 1 %
HCT: 38.3 % — ABNORMAL LOW (ref 39.0–52.0)
Hemoglobin: 11.5 g/dL — ABNORMAL LOW (ref 13.0–17.0)
Immature Granulocytes: 0 %
Lymphocytes Relative: 11 %
Lymphs Abs: 1.1 10*3/uL (ref 0.7–4.0)
MCH: 25.6 pg — ABNORMAL LOW (ref 26.0–34.0)
MCHC: 30 g/dL (ref 30.0–36.0)
MCV: 85.3 fL (ref 80.0–100.0)
Monocytes Absolute: 0.5 10*3/uL (ref 0.1–1.0)
Monocytes Relative: 5 %
Neutro Abs: 8 10*3/uL — ABNORMAL HIGH (ref 1.7–7.7)
Neutrophils Relative %: 82 %
Platelet Count: 319 10*3/uL (ref 150–400)
RBC: 4.49 MIL/uL (ref 4.22–5.81)
RDW: 19.1 % — ABNORMAL HIGH (ref 11.5–15.5)
WBC Count: 9.8 10*3/uL (ref 4.0–10.5)
nRBC: 0 % (ref 0.0–0.2)

## 2023-05-06 MED ORDER — SODIUM CHLORIDE 0.9 % IV SOLN
200.0000 mg | Freq: Once | INTRAVENOUS | Status: AC
  Administered 2023-05-06: 200 mg via INTRAVENOUS
  Filled 2023-05-06: qty 200

## 2023-05-06 MED ORDER — SODIUM CHLORIDE 0.9 % IV SOLN: Freq: Once | INTRAVENOUS | Status: AC

## 2023-05-06 MED ORDER — PROCHLORPERAZINE MALEATE 10 MG PO TABS
10.0000 mg | ORAL_TABLET | Freq: Once | ORAL | Status: AC
  Administered 2023-05-06: 10 mg via ORAL
  Filled 2023-05-06: qty 1

## 2023-05-06 MED ORDER — SODIUM CHLORIDE 0.9 % IV SOLN
500.0000 mg/m2 | Freq: Once | INTRAVENOUS | Status: AC
  Administered 2023-05-06: 1000 mg via INTRAVENOUS
  Filled 2023-05-06: qty 40

## 2023-05-06 NOTE — Progress Notes (Signed)
[facility] [phone]   Fax:([phone]) [phone]  OFFICE PROGRESS NOTE  Bradley Masters, FNP [address] [place] [address]  DIAGNOSIS: Stage IV (T1b, N3, M1C) non-small cell lung cancer, favoring adenocarcinoma presented with right upper lobe lung nodule in addition to right hilar, subcarinal and bilateral mediastinal as well as supraclavicular lymphadenopathy in addition to bone and brain metastasis diagnosed in June 2022.     PD-L1 expression 80%.     Molecular Studies:  Biomarker Findings Microsatellite status - MS-Stable Tumor Mutational Burden - 6 Muts/Mb Genomic Findings For a complete list of the genes assayed, please refer to the Appendix. KRAS G12C, amplification ATM S470* CCND1 amplification - equivocal? HGF amplification - equivocal? MYC amplification - equivocal? FGF19 amplification - equivocal? FGF3 amplification - equivocal? FGF4 amplification - equivocal? NFKBIA amplification NKX2-1 amplification RAD21 amplification - equivocal? [id number]*26 TERT promoter -124C>T TP53 rearrangement exon 9 7 Disease relevant genes with no reportable alterations: ALK, BRAF, EGFR, ERBB2, MET, RET, ROS1   PRIOR THERAPY: SRS to the metastatic brain lesions under the care of Dr. Kathrynn Running.  Last treatment on 05/04/2021.   CURRENT THERAPY: Palliative systemic chemotherapy with carboplatin for an AUC 5, Alimta 500 mg/m2 and, Keytruda 200 mg IV every 3 weeks.  First dose expected on 05/08/2021.  Status post 34 cycles.  Starting from cycle #5 he is on maintenance treatment with Alimta and Keytruda every 3 weeks.  INTERVAL HISTORY: Bradley Goodman 67 y.o. male returns to the clinic today for follow-up visit accompanied by his wife.  The patient is feeling fine today with no concerning complaints.  He denied having any chest pain, shortness of breath, cough or hemoptysis.  He has no nausea, vomiting, diarrhea or constipation.  He has no headache or  visual changes.  He denied having any recent weight loss or night sweats.  He has been tolerating his treatment fairly well.  He is here today for evaluation before starting cycle #35 of his treatment.  MEDICAL HISTORY: Past Medical History:  Diagnosis Date   Cervical spondylolysis    Essential hypertension    GERD (gastroesophageal reflux disease)    History of kidney stones    History of migraine    Hyperlipidemia    Hypertension    PONV (postoperative nausea and vomiting)    Type 2 diabetes mellitus (HCC)     ALLERGIES:  has No Known Allergies.  MEDICATIONS:  Current Outpatient Medications  Medication Sig Dispense Refill   cholecalciferol (VITAMIN D) 1000 UNITS tablet Take 1,000 Units by mouth every evening.     Continuous Glucose Sensor (FREESTYLE LIBRE 3 SENSOR) MISC Place 1 sensor on the skin every 14 days. Use to check glucose continuously 6 each 3   dexamethasone (DECADRON) 1 MG tablet Take 1 tablet (1 mg total) by mouth daily with breakfast. 90 tablet 1   docusate sodium (COLACE) 100 MG capsule Take 100 mg by mouth daily.     folic acid (FOLVITE) 1 MG tablet Take 1 tablet by mouth daily. 90 tablet 4   Krill Oil 300 MG CAPS Take 300 mg by mouth every evening.     levothyroxine (SYNTHROID) 75 MCG tablet Take 1 tablet (75 mcg total) by mouth daily before breakfast. 90 tablet 1   loratadine (CLARITIN) 10 MG tablet Take 10 mg by mouth every evening.      losartan (COZAAR) 50 MG tablet Take 1.5 tablets (75 mg total) by mouth daily. 135 tablet 1  mirtazapine (REMERON) 15 MG tablet Take 1 tablet (15 mg total) by mouth at bedtime. 90 tablet 1   Multiple Vitamins-Minerals (MULTIVITAMIN GUMMIES ADULT PO) Take 2 each by mouth daily.     pantoprazole (PROTONIX) 40 MG tablet Take 1 tablet (40 mg total) by mouth every evening. 90 tablet 1   PRESCRIPTION MEDICATION Testosterone injection     prochlorperazine (COMPAZINE) 10 MG tablet Take 1 tablet (10 mg total) by mouth every 6 (six) hours  as needed for nausea or vomiting. 30 tablet 0   rosuvastatin (CRESTOR) 20 MG tablet Take 1 tablet (20 mg total) by mouth daily. 90 tablet 0   Semaglutide,0.25 or 0.5MG /DOS, (OZEMPIC, 0.25 OR 0.5 MG/DOSE,) 2 MG/3ML SOPN Inject 0.5 mg into the skin once a week. 9 mL 1   SYRINGE-NEEDLE, DISP, 3 ML 21G X 1-1/2" 3 ML MISC Use to inject testosterone every week 100 each 2   testosterone cypionate (DEPOTESTOTERONE CYPIONATE) 100 MG/ML injection Take 50mg  ( 0.56ml) and 100mg  ( 1ml) into muscle alternatively weekly. 10 mL 0   venlafaxine XR (EFFEXOR-XR) 75 MG 24 hr capsule Take 1 capsule (75 mg total) by mouth daily with breakfast. 90 capsule 0   No current facility-administered medications for this visit.    SURGICAL HISTORY:  Past Surgical History:  Procedure Laterality Date   Bilateral inguinal hernia repair     BRONCHIAL NEEDLE ASPIRATION BIOPSY  04/05/2021   Procedure: BRONCHIAL NEEDLE ASPIRATION BIOPSIES;  Surgeon: Josephine Igo, DO;  Location: [facility];  Service: Pulmonary;;   COLONOSCOPY  01/23/2012   Procedure: COLONOSCOPY;  Surgeon: Corbin Ade, MD;  Location: [facility];  Service: Endoscopy;  Laterality: N/A;  9:30 AM   COLONOSCOPY N/A 10/11/2015   Procedure: COLONOSCOPY;  Surgeon: Corbin Ade, MD;  Location: [facility];  Service: Endoscopy;  Laterality: N/A;  830   COLONOSCOPY N/A 10/14/2019   Procedure: COLONOSCOPY;  Surgeon: Corbin Ade, MD;  Location: [facility];  Service: Endoscopy;  Laterality: N/A;  1:45   POLYPECTOMY  10/14/2019   Procedure: POLYPECTOMY;  Surgeon: Corbin Ade, MD;  Location: [facility];  Service: Endoscopy;;  ascending colon, descending colon   VIDEO BRONCHOSCOPY WITH ENDOBRONCHIAL ULTRASOUND N/A 04/05/2021   Procedure: VIDEO BRONCHOSCOPY WITH ENDOBRONCHIAL ULTRASOUND;  Surgeon: Josephine Igo, DO;  Location: [facility];  Service: Pulmonary;  Laterality: N/A;    REVIEW OF SYSTEMS:  A comprehensive review of systems was negative.    PHYSICAL EXAMINATION: General appearance: alert, cooperative, appears stated age, and no distress Head: Normocephalic, without obvious abnormality, atraumatic Neck: no adenopathy, no JVD, supple, symmetrical, trachea midline, and thyroid not enlarged, symmetric, no tenderness/mass/nodules Lymph nodes: Cervical, supraclavicular, and axillary nodes normal. Resp: clear to auscultation bilaterally Back: symmetric, no curvature. ROM normal. No CVA tenderness. Cardio: regular rate and rhythm, S1, S2 normal, no murmur, click, rub or gallop GI: soft, non-tender; bowel sounds normal; no masses,  no organomegaly Extremities: extremities normal, atraumatic, no cyanosis or edema  ECOG PERFORMANCE STATUS: 1 - Symptomatic but completely ambulatory  Blood pressure 138/85, pulse 92, temperature 98.5 F (36.9 C), temperature source Oral, resp. rate 18, height 5\' 11"  (1.803 m), weight 187 lb (84.8 kg), SpO2 100%.  LABORATORY DATA: Lab Results  Component Value Date   WBC 11.5 (H) 04/15/2023   HGB 11.9 (L) 04/15/2023   HCT 38.1 (L) 04/15/2023   MCV 86.0 04/15/2023   PLT 296 04/15/2023      Chemistry  Component Value Date/Time   NA 139 04/15/2023 1406   NA 144 01/10/2021 0849   K 3.9 04/15/2023 1406   CL 108 04/15/2023 1406   CO2 25 04/15/2023 1406   BUN 13 04/15/2023 1406   BUN 15 01/10/2021 0849   CREATININE 1.02 04/15/2023 1406      Component Value Date/Time   CALCIUM 8.9 04/15/2023 1406   ALKPHOS 57 04/15/2023 1406   AST 19 04/15/2023 1406   ALT 16 04/15/2023 1406   BILITOT 0.3 04/15/2023 1406       RADIOGRAPHIC STUDIES: No results found.  ASSESSMENT AND PLAN: This is a very pleasant 67 years old white male recently diagnosed with stage IV (T1b, N3, M1 C) non-small cell lung cancer favoring adenocarcinoma presented with right upper lobe lung nodule in addition to right hilar, subcarinal and bilateral mediastinal as well as supraclavicular lymphadenopathy.  The patient also  has bone and brain metastasis diagnosed in June 2022.  His PD-L1 expression is 80% and his molecular studies showed KRAS G12C mutation. The patient underwent SRS to metastatic brain lesion under the care of Dr. Kathrynn Running and he is currently undergoing systemic chemotherapy with carboplatin for AUC of 5, Alimta 500 Mg/M2 and Keytruda 200 Mg IV every 3 weeks status post 34 cycles.  Starting from cycle #5 the patient will be treated with maintenance treatment with Alimta and Keytruda every 3 weeks. The patient has been tolerating this treatment well with no concerning adverse effects. I recommended for him to proceed with cycle #35 today as planned. The patient will come back for follow-up visit in 3 weeks for evaluation with repeat CT scan of the chest, abdomen and pelvis for restaging of his disease. He was advised to call immediately if he has any other concerning symptoms in the interval. The patient voices understanding of current disease status and treatment options and is in agreement with the current care plan.  All questions were answered. The patient knows to call the clinic with any problems, questions or concerns. We can certainly see the patient much sooner if necessary. The total time spent in the appointment was 20 minutes.  Disclaimer: This note was dictated with voice recognition software. Similar sounding words can inadvertently be transcribed and may not be corrected upon review.

## 2023-05-06 NOTE — Patient Instructions (Signed)
[facility]   Discharge Instructions: Thank you for choosing [facility] to provide your oncology and hematology care.   If you have a lab appointment with the Cancer Center, please go directly to the Cancer Center and check in at the registration area.   Wear comfortable clothing and clothing appropriate for easy access to any Portacath or PICC line.   We strive to give you quality time with your provider. You may need to reschedule your appointment if you arrive late (15 or more minutes).  Arriving late affects you and other patients whose appointments are after yours.  Also, if you miss three or more appointments without notifying the office, you may be dismissed from the clinic at the provider's discretion.      For prescription refill requests, have your pharmacy contact our office and allow 72 hours for refills to be completed.    Today you received the following chemotherapy and/or immunotherapy agents: Pembrolizumab (Keytruda) and Pemetrexed (Alimta)      To help prevent nausea and vomiting after your treatment, we encourage you to take your nausea medication as directed.  BELOW ARE SYMPTOMS THAT SHOULD BE REPORTED IMMEDIATELY: *FEVER GREATER THAN 100.4 F (38 C) OR HIGHER *CHILLS OR SWEATING *NAUSEA AND VOMITING THAT IS NOT CONTROLLED WITH YOUR NAUSEA MEDICATION *UNUSUAL SHORTNESS OF BREATH *UNUSUAL BRUISING OR BLEEDING *URINARY PROBLEMS (pain or burning when urinating, or frequent urination) *BOWEL PROBLEMS (unusual diarrhea, constipation, pain near the anus) TENDERNESS IN MOUTH AND THROAT WITH OR WITHOUT PRESENCE OF ULCERS (sore throat, sores in mouth, or a toothache) UNUSUAL RASH, SWELLING OR PAIN  UNUSUAL VAGINAL DISCHARGE OR ITCHING   Items with * indicate a potential emergency and should be followed up as soon as possible or go to the Emergency Department if any problems should occur.  Please show the CHEMOTHERAPY ALERT  CARD or IMMUNOTHERAPY ALERT CARD at check-in to the Emergency Department and triage nurse.  Should you have questions after your visit or need to cancel or reschedule your appointment, please contact [facility]  Dept: [phone]  and follow the prompts.  Office hours are 8:00 a.m. to 4:30 p.m. Monday - Friday. Please note that voicemails left after 4:00 p.m. may not be returned until the following business day.  We are closed weekends and major holidays. You have access to a nurse at all times for urgent questions. Please call the main number to the clinic Dept: [phone] and follow the prompts.   For any non-urgent questions, you may also contact your provider using MyChart. We now offer e-Visits for anyone 18 and older to request care online for non-urgent symptoms. For details visit [email or web address].   Also download the MyChart app! Go to the app store, search "MyChart", open the app, select [facility], and log in with your MyChart username and password. 

## 2023-05-07 ENCOUNTER — Other Ambulatory Visit (HOSPITAL_COMMUNITY): Payer: Self-pay

## 2023-05-07 ENCOUNTER — Encounter: Payer: Self-pay | Admitting: Internal Medicine

## 2023-05-07 ENCOUNTER — Other Ambulatory Visit: Payer: Self-pay

## 2023-05-07 ENCOUNTER — Encounter: Payer: Self-pay | Admitting: "Endocrinology

## 2023-05-07 ENCOUNTER — Ambulatory Visit (INDEPENDENT_AMBULATORY_CARE_PROVIDER_SITE_OTHER): Payer: Commercial Managed Care - PPO | Admitting: "Endocrinology

## 2023-05-07 VITALS — BP 126/78 | HR 92 | Ht 71.0 in | Wt 190.0 lb

## 2023-05-07 DIAGNOSIS — Z7985 Long-term (current) use of injectable non-insulin antidiabetic drugs: Secondary | ICD-10-CM

## 2023-05-07 DIAGNOSIS — E2740 Unspecified adrenocortical insufficiency: Secondary | ICD-10-CM

## 2023-05-07 DIAGNOSIS — E230 Hypopituitarism: Secondary | ICD-10-CM | POA: Diagnosis not present

## 2023-05-07 DIAGNOSIS — E039 Hypothyroidism, unspecified: Secondary | ICD-10-CM

## 2023-05-07 DIAGNOSIS — E119 Type 2 diabetes mellitus without complications: Secondary | ICD-10-CM

## 2023-05-07 DIAGNOSIS — E291 Testicular hypofunction: Secondary | ICD-10-CM

## 2023-05-07 MED ORDER — LEVOTHYROXINE SODIUM 88 MCG PO TABS
88.0000 ug | ORAL_TABLET | Freq: Every day | ORAL | 1 refills | Status: DC
Start: 2023-05-07 — End: 2023-11-23
  Filled 2023-05-07: qty 90, 90d supply, fill #0
  Filled 2023-08-01: qty 90, 90d supply, fill #1

## 2023-05-07 MED ORDER — TESTOSTERONE CYPIONATE 100 MG/ML IM SOLN
INTRAMUSCULAR | 1 refills | Status: DC
  Filled 2023-05-07: qty 10, 84d supply, fill #0
  Filled 2023-09-02: qty 10, 84d supply, fill #1

## 2023-05-07 NOTE — Patient Instructions (Signed)

## 2023-05-07 NOTE — Progress Notes (Signed)
Endocrinology follow-up note                                             05/07/2023, 10:11 AM   Subjective:    Patient ID: Bradley Goodman, male    DOB: 28-May-1956, PCP [name], FNP   Past Medical History:  Diagnosis Date   Cervical spondylolysis    Essential hypertension    GERD (gastroesophageal reflux disease)    History of kidney stones    History of migraine    Hyperlipidemia    Hypertension    PONV (postoperative nausea and vomiting)    Type 2 diabetes mellitus (HCC)    Past Surgical History:  Procedure Laterality Date   Bilateral inguinal hernia repair     BRONCHIAL NEEDLE ASPIRATION BIOPSY  04/05/2021   Procedure: BRONCHIAL NEEDLE ASPIRATION BIOPSIES;  Surgeon: Josephine Igo, DO;  Location: [facility];  Service: Pulmonary;;   COLONOSCOPY  01/23/2012   Procedure: COLONOSCOPY;  Surgeon: Corbin Ade, MD;  Location: [facility];  Service: Endoscopy;  Laterality: N/A;  9:30 AM   COLONOSCOPY N/A 10/11/2015   Procedure: COLONOSCOPY;  Surgeon: Corbin Ade, MD;  Location: [facility];  Service: Endoscopy;  Laterality: N/A;  830   COLONOSCOPY N/A 10/14/2019   Procedure: COLONOSCOPY;  Surgeon: Corbin Ade, MD;  Location: [facility];  Service: Endoscopy;  Laterality: N/A;  1:45   POLYPECTOMY  10/14/2019   Procedure: POLYPECTOMY;  Surgeon: Corbin Ade, MD;  Location: [facility];  Service: Endoscopy;;  ascending colon, descending colon   VIDEO BRONCHOSCOPY WITH ENDOBRONCHIAL ULTRASOUND N/A 04/05/2021   Procedure: VIDEO BRONCHOSCOPY WITH ENDOBRONCHIAL ULTRASOUND;  Surgeon: Josephine Igo, DO;  Location: [facility];  Service: Pulmonary;  Laterality: N/A;   Social History   Socioeconomic History   Marital status: Married    Spouse name: Not on file   Number of children: Not on file   Years of education: Not on file   Highest education level: Bachelor's degree (e.g., BA, AB, BS)  Occupational History   Not on file  Tobacco Use   Smoking  status: Every Day    Current packs/day: 0.50    Average packs/day: 0.5 packs/day for 49.5 years (24.8 ttl pk-yrs)    Types: Cigarettes    Start date: 10/30/1973   Smokeless tobacco: Never  Vaping Use   Vaping status: Never Used  Substance and Sexual Activity   Alcohol use: Not Currently    Comment: One drink every 6 months.   Drug use: No   Sexual activity: Yes  Other Topics Concern   Not on file  Social History Narrative   Not on file   Social Determinants of Health   Financial Resource Strain: Patient Declined (04/15/2023)   Overall Financial Resource Strain (CARDIA)    Difficulty of Paying Living Expenses: Patient declined  Food Insecurity: No Food Insecurity (04/15/2023)   Hunger Vital Sign    Worried About Running Out of Food in the Last Year: Never true    Ran Out of Food in the Last Year: Never true  Transportation Needs: No Transportation Needs (04/15/2023)   PRAPARE - Administrator, Civil Service (Medical): No    Lack of Transportation (Non-Medical): No  Physical Activity: Unknown (04/15/2023)   Exercise Vital Sign  Days of Exercise per Week: 1 day    Minutes of Exercise per Session: Patient declined  Stress: No Stress Concern Present (04/15/2023)   [employer] of Occupational Health - Occupational Stress Questionnaire    Feeling of Stress : Only a little  Social Connections: Unknown (04/15/2023)   Social Connection and Isolation Panel [NHANES]    Frequency of Communication with Friends and Family: Patient declined    Frequency of Social Gatherings with Friends and Family: Patient declined    Attends Religious Services: Not on Marketing executive or Organizations: No    Attends Engineer, structural: Not on file    Marital Status: Married   Family History  Problem Relation Age of Onset   Hypertension Mother    Diabetes Mother    Heart attack Mother    Hypertension Father    Heart attack Father    Heart attack Brother    Colon  cancer Neg Hx    Outpatient Encounter Medications as of 05/07/2023  Medication Sig   cholecalciferol (VITAMIN D) 1000 UNITS tablet Take 1,000 Units by mouth every evening.   Continuous Glucose Sensor (FREESTYLE LIBRE 3 SENSOR) MISC Place 1 sensor on the skin every 14 days. Use to check glucose continuously   dexamethasone (DECADRON) 1 MG tablet Take 1 tablet (1 mg total) by mouth daily with breakfast.   docusate sodium (COLACE) 100 MG capsule Take 100 mg by mouth daily.   folic acid (FOLVITE) 1 MG tablet Take 1 tablet by mouth daily.   Krill Oil 300 MG CAPS Take 300 mg by mouth every evening.   levothyroxine (SYNTHROID) 88 MCG tablet Take 1 tablet (88 mcg total) by mouth daily before breakfast.   loratadine (CLARITIN) 10 MG tablet Take 10 mg by mouth every evening.    losartan (COZAAR) 50 MG tablet Take 1.5 tablets (75 mg total) by mouth daily.   mirtazapine (REMERON) 15 MG tablet Take 1 tablet (15 mg total) by mouth at bedtime.   Multiple Vitamins-Minerals (MULTIVITAMIN GUMMIES ADULT PO) Take 2 each by mouth daily.   pantoprazole (PROTONIX) 40 MG tablet Take 1 tablet (40 mg total) by mouth every evening.   PRESCRIPTION MEDICATION Testosterone injection   prochlorperazine (COMPAZINE) 10 MG tablet Take 1 tablet (10 mg total) by mouth every 6 (six) hours as needed for nausea or vomiting.   rosuvastatin (CRESTOR) 20 MG tablet Take 1 tablet (20 mg total) by mouth daily.   Semaglutide,0.25 or 0.5MG /DOS, (OZEMPIC, 0.25 OR 0.5 MG/DOSE,) 2 MG/3ML SOPN Inject 0.5 mg into the skin once a week.   SYRINGE-NEEDLE, DISP, 3 ML 21G X 1-1/2" 3 ML MISC Use to inject testosterone every week   testosterone cypionate (DEPOTESTOTERONE CYPIONATE) 100 MG/ML injection Take 50mg  ( 0.27ml) and 100mg  ( 1ml) into muscle alternatively weekly.   venlafaxine XR (EFFEXOR-XR) 75 MG 24 hr capsule Take 1 capsule (75 mg total) by mouth daily with breakfast.   [DISCONTINUED] levothyroxine (SYNTHROID) 75 MCG tablet Take 1 tablet (75  mcg total) by mouth daily before breakfast.   [DISCONTINUED] testosterone cypionate (DEPOTESTOTERONE CYPIONATE) 100 MG/ML injection Take 50mg  ( 0.4ml) and 100mg  ( 1ml) into muscle alternatively weekly.   No facility-administered encounter medications on file as of 05/07/2023.   ALLERGIES: No Known Allergies  VACCINATION STATUS: Immunization History  Administered Date(s) Administered   Fluad Quad(high Dose 65+) 07/28/2022   Hepatitis B 02/06/1990, 10/28/1990, 04/02/1991   Influenza,inj,Quad PF,6+ Mos 07/07/2019, 07/31/2021   Influenza-Unspecified 07/25/2018  Moderna Sars-Covid-2 Vaccination 12/01/2019, 12/30/2019, 10/19/2020   Pneumococcal Conjugate-13 03/11/2017   Tdap 01/17/2016    HPI TERIN BARSE is 67 y.o. male who presents today for follow-up after he was seen in consultation for multiple medical problems as follows.  He notes from previous visits. History is obtained directly from the patient as well as chart review. He is accompanied by his wife.  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   He is status post radiosurgery of CNS metastasis and chemotherapy.  His chemotherapy in the involved high-dose steroids in the form of dexamethasone, currently on dexamethasone 2 mg p.o. daily.     During this process, he developed fatigue, low libido, and loss of skeletal muscle mass.  His labs showed hypogonadism, partial adrenal insufficiency, and hypothyroidism.  For this endocrine deficits, he was started on testosterone injection 50 mg 100 mg IM in alternate weeks.  His previsit labs show total testosterone of 502, improving from his last measurement.  He has no new complaints today.  He is also on Decadron 1 mg p.o. daily for glucocorticoid deficit.   He denies prior testicular injury.  He denies testicular radiation . His previsit thyroid function tests are consistent with appropriate replacement, currently on levothyroxine 75 mcg  p.o. daily before breakfast.     He wishes to be continued on testosterone replacement therapy as well as his other hormones.  Continues to gain weight progressively.  He continues to smoke. He is back to enjoy his outdoor activities including golfing.    Review of Systems  Constitutional: + Stable weight,   + fatigue, + subjective hypothermia, + low libido  Objective:       05/07/2023    9:48 AM 05/06/2023    1:39 PM 04/16/2023    8:42 AM  Vitals with BMI  Height 5\' 11"  5\' 11"    Weight 190 lbs 187 lbs   BMI 26.51 26.09   Systolic 126 138 409  Diastolic 78 85 86  Pulse 92 92     BP 126/78   Pulse 92   Ht 5\' 11"  (1.803 m)   Wt 190 lb (86.2 kg)   BMI 26.50 kg/m   Wt Readings from Last 3 Encounters:  05/07/23 190 lb (86.2 kg)  05/06/23 187 lb (84.8 kg)  04/16/23 188 lb 3.2 oz (85.4 kg)    Physical Exam  Constitutional:  Body mass index is 26.5 kg/m.,  not in acute distress, normal state of mind Eyes: PERRLA, EOMI, no exophthalmos ENT: moist mucous membranes, no gross thyromegaly, no gross cervical lymphadenopathy   CMP ( most recent) CMP     Component Value Date/Time   NA 141 05/06/2023 1307   NA 144 01/10/2021 0849   K 3.7 05/06/2023 1307   CL 109 05/06/2023 1307   CO2 26 05/06/2023 1307   GLUCOSE 96 05/06/2023 1307   BUN 12 05/06/2023 1307   BUN 15 01/10/2021 0849   CREATININE 1.01 05/06/2023 1307   CALCIUM 8.9 05/06/2023 1307   PROT 6.2 (L) 05/06/2023 1307   PROT 6.3 01/10/2021 0849   ALBUMIN 3.8 05/06/2023 1307   ALBUMIN 4.2 01/10/2021 0849   AST 18 05/06/2023 1307   ALT 13 05/06/2023 1307   ALKPHOS 62 05/06/2023 1307   BILITOT 0.2 (L) 05/06/2023 1307   GFRNONAA >60 05/06/2023 1307   GFRAA 82 07/14/2020 0945     Diabetic Labs (most recent): Lab Results  Component Value Date  HGBA1C 6.0 (H) 04/16/2023   HGBA1C 6.5 (H) 10/16/2022   HGBA1C 6.6 (H) 07/31/2022   MICROALBUR neg 07/13/2014     Lipid Panel ( most recent) Lipid Panel      Component Value Date/Time   CHOL 95 12/10/2022 0805   CHOL 112 07/31/2022 0821   TRIG 199 (H) 12/10/2022 0805   TRIG 87 10/18/2016 0851   HDL 34 (L) 12/10/2022 0805   HDL 38 (L) 07/31/2022 0821   HDL 37 (L) 10/18/2016 0851   CHOLHDL 2.8 12/10/2022 0805   VLDL 40 12/10/2022 0805   LDLCALC 21 12/10/2022 0805   LDLCALC 40 07/31/2022 0821   LDLCALC 53 07/13/2014 0925   LABVLDL 34 07/31/2022 0821      Lab Results  Component Value Date   TSH 0.768 04/29/2023   TSH 0.468 04/15/2023   TSH 0.839 03/26/2023   TSH 1.189 02/12/2023   TSH 0.802 12/12/2022   TSH 0.780 10/16/2022   TSH 0.883 09/24/2022   TSH 1.053 09/07/2022   TSH 0.751 09/03/2022   TSH 0.705 08/13/2022   FREET4 0.81 04/29/2023   FREET4 0.81 12/10/2022   FREET4 0.60 (L) 09/07/2022   FREET4 0.76 06/04/2022   FREET4 0.58 (L) 01/22/2022   FREET4 0.75 09/22/2021      Latest Reference Range & Units 01/22/22 08:18 01/22/22 08:20  Sex Horm Binding Glob, Serum 19.3 - 76.4 nmol/L  16.7 (L)  Testosterone 264 - 916 ng/dL  161 (L)  Testosterone Free 6.6 - 18.1 pg/mL  4.7 (L)  Testosterone-% Free 0.2 - 0.7 %  1.9 (H)  TSH 0.350 - 4.500 uIU/mL 1.858   Triiodothyronine,Free,Serum 2.0 - 4.4 pg/mL 2.0   T4,Free(Direct) 0.61 - 1.12 ng/dL 0.96 (L)   (L): Data is abnormally low (H): Data is abnormally high  Assessment & Plan:   1. Hypopituitarism  2.  Hypogonadism 3.hypothyroidism 4.partial adrenal insufficiency -He is accompanied by his wife to clinic.  I discussed his recent findings and previous lab work with him.  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 -He is advised to continue Decadron 1 mg p.o. daily at breakfast.     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  His previsit thyroid function tests are improving, however will benefit from slight increase in his levothyroxine dose.   I discussed and increased his levothyroxine to 88 mcg p.o. daily before breakfast    - We discussed about the correct intake of his thyroid hormone, on empty stomach at fasting, with water, separated by at least 30 minutes from breakfast and other medications,  and separated by more than 4 hours from calcium, iron, multivitamins, acid reflux medications (PPIs). -Patient is made aware of the fact that thyroid hormone replacement is needed for life, dose to be adjusted by periodic monitoring of thyroid function tests.   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  He is advised to continue testosterone cypionate IM  50 mg alternating with  100 mg intramuscularly weekly until next measurement.  This will give him a total of 300 mg of testosterone monthly.    -Treatment target for him will be between 250-350 ng per DL, with the aim being to help him with libido as well as possibly avoid further loss of skeletal muscle mass.  Regarding his type 2 diabetes, his recent A1c was 6.6%.   He is  advised to continue Ozempic 0.5 mg subcutaneously weekly.  He was taken off of metformin recently.   The patient was counseled on the dangers of tobacco use, and was advised to quit.  Reviewed strategies to maximize success, including removing cigarettes and smoking materials from environment.   - he is advised to maintain close follow up with his oncologist, neurologist, radiologist and PCP [name], FNP for primary care needs.    I spent  26  minutes in the care of the patient today including review of labs from Thyroid Function, CMP, and other relevant labs ; imaging/biopsy records (current and previous including abstractions from other facilities); face-to-face time discussing  his lab results and symptoms, medications doses, his options of short and long term treatment based on the latest standards of care / guidelines;   and  documenting the encounter.  Jewel Baize Salais  participated in the discussions, expressed understanding, and voiced agreement with the above plans.  All questions were answered to his satisfaction. he is encouraged to contact clinic should he have any questions or concerns prior to his return visit.    Follow up plan: Return in about 6 months (around 11/07/2023) for Fasting Labs  in AM B4 8, A1c -[place].   Marquis Lunch, MD [facility] [address] [place], [place] [address] Phone: [phone]  Fax: [phone]     05/07/2023, 10:11 AM  This note was partially dictated with voice recognition software. Similar sounding words can be transcribed inadequately or may not  be corrected upon review.

## 2023-05-08 ENCOUNTER — Other Ambulatory Visit: Payer: Self-pay

## 2023-05-20 ENCOUNTER — Other Ambulatory Visit: Payer: Self-pay | Admitting: Family Medicine

## 2023-05-20 ENCOUNTER — Other Ambulatory Visit: Payer: Self-pay

## 2023-05-20 ENCOUNTER — Other Ambulatory Visit (HOSPITAL_COMMUNITY): Payer: Self-pay

## 2023-05-20 DIAGNOSIS — E1165 Type 2 diabetes mellitus with hyperglycemia: Secondary | ICD-10-CM

## 2023-05-20 MED ORDER — OZEMPIC (0.25 OR 0.5 MG/DOSE) 2 MG/3ML ~~LOC~~ SOPN
0.5000 mg | PEN_INJECTOR | SUBCUTANEOUS | 0 refills | Status: DC
Start: 2023-05-20 — End: 2023-07-15
  Filled 2023-05-20: qty 9, 84d supply, fill #0

## 2023-05-22 NOTE — Progress Notes (Signed)
[facility] OFFICE PROGRESS NOTE  Bradley Masters, FNP [address] [place] [address]  DIAGNOSIS: Stage IV (T1b, N3, M1C) non-small cell lung cancer, favoring adenocarcinoma presented with right upper lobe lung nodule in addition to right hilar, subcarinal and bilateral mediastinal as well as supraclavicular lymphadenopathy in addition to bone and brain metastasis diagnosed in June 2022.   PD-L1 expression 80%.     Molecular Studies:  Biomarker Findings Microsatellite status - MS-Stable Tumor Mutational Burden - 6 Muts/Mb Genomic Findings For a complete list of the genes assayed, please refer to the Appendix. KRAS G12C, amplification ATM S470* CCND1 amplification - equivocal? HGF amplification - equivocal? MYC amplification - equivocal? FGF19 amplification - equivocal? FGF3 amplification - equivocal? FGF4 amplification - equivocal? NFKBIA amplification NKX2-1 amplification RAD21 amplification - equivocal? [id number]*26 TERT promoter -124C>T TP53 rearrangement exon 9 7 Disease relevant genes with no reportable alterations: ALK, BRAF, EGFR, ERBB2, MET, RET, ROS1   PRIOR THERAPY: SRS to the metastatic brain lesions under the care of Dr. Kathrynn Running.  Last treatment on 05/04/2021.   CURRENT THERAPY: Palliative systemic chemotherapy with carboplatin for an AUC 5, Alimta 500 mg/m2 and, Keytruda 200 mg IV every 3 weeks.  First dose expected on 05/08/2021.  Status post 35 cycles.  Starting from cycle #5 he is on maintenance treatment with Alimta and Keytruda every 3 weeks.   INTERVAL HISTORY: Bradley Goodman 67 y.o. male returns to the clinic today for a follow-up visit accompanied by his wife. The patient was last seen by myself 3 weeks ago. Today, he denies any fever, chills, or night sweats. He lost a lot of weight since he was last seen which he attributes to improvement in his lower extremity swelling. He has a new blister on his right lower extremity. Denies  any dyspnea, cough, chest pain, or hemoptysis.  Denies any nausea, vomiting, diarrhea, or constipation. He denies unusual headaches.  Denies rashes and skin changes except for easy bruising and the blister, which is not draining. He recently had a restaging CT scan. He is here for evaluation and repeat blood work before starting cycle 36   MEDICAL HISTORY: Past Medical History:  Diagnosis Date   Cervical spondylolysis    Essential hypertension    GERD (gastroesophageal reflux disease)    History of kidney stones    History of migraine    Hyperlipidemia    Hypertension    PONV (postoperative nausea and vomiting)    Type 2 diabetes mellitus (HCC)     ALLERGIES:  has No Known Allergies.  MEDICATIONS:  Current Outpatient Medications  Medication Sig Dispense Refill   cholecalciferol (VITAMIN D) 1000 UNITS tablet Take 1,000 Units by mouth every evening.     Continuous Glucose Sensor (FREESTYLE LIBRE 3 SENSOR) MISC Place 1 sensor on the skin every 14 days. Use to check glucose continuously 6 each 3   dexamethasone (DECADRON) 1 MG tablet Take 1 tablet (1 mg total) by mouth daily with breakfast. 90 tablet 1   docusate sodium (COLACE) 100 MG capsule Take 100 mg by mouth daily.     folic acid (FOLVITE) 1 MG tablet Take 1 tablet by mouth daily. 90 tablet 4   Krill Oil 300 MG CAPS Take 300 mg by mouth every evening.     levothyroxine (SYNTHROID) 88 MCG tablet Take 1 tablet (88 mcg total) by mouth daily before breakfast. 90 tablet 1   loratadine (CLARITIN) 10 MG tablet Take 10 mg by mouth every evening.  losartan (COZAAR) 50 MG tablet Take 1.5 tablets (75 mg total) by mouth daily. 135 tablet 1   mirtazapine (REMERON) 15 MG tablet Take 1 tablet (15 mg total) by mouth at bedtime. 90 tablet 1   Multiple Vitamins-Minerals (MULTIVITAMIN GUMMIES ADULT PO) Take 2 each by mouth daily.     pantoprazole (PROTONIX) 40 MG tablet Take 1 tablet (40 mg total) by mouth every evening. 90 tablet 1    PRESCRIPTION MEDICATION Testosterone injection     prochlorperazine (COMPAZINE) 10 MG tablet Take 1 tablet (10 mg total) by mouth every 6 (six) hours as needed for nausea or vomiting. 30 tablet 0   rosuvastatin (CRESTOR) 20 MG tablet Take 1 tablet (20 mg total) by mouth daily. 90 tablet 0   Semaglutide,0.25 or 0.5MG /DOS, (OZEMPIC, 0.25 OR 0.5 MG/DOSE,) 2 MG/3ML SOPN Inject 0.5 mg into the skin once a week. 9 mL 0   SYRINGE-NEEDLE, DISP, 3 ML 21G X 1-1/2" 3 ML MISC Use to inject testosterone every week 100 each 2   testosterone cypionate (DEPOTESTOTERONE CYPIONATE) 100 MG/ML injection Take 50mg  ( 0.5ml) and 100mg  ( 1ml) into muscle alternatively weekly. 10 mL 1   venlafaxine XR (EFFEXOR-XR) 75 MG 24 hr capsule Take 1 capsule (75 mg total) by mouth daily with breakfast. 90 capsule 0   No current facility-administered medications for this visit.    SURGICAL HISTORY:  Past Surgical History:  Procedure Laterality Date   Bilateral inguinal hernia repair     BRONCHIAL NEEDLE ASPIRATION BIOPSY  04/05/2021   Procedure: BRONCHIAL NEEDLE ASPIRATION BIOPSIES;  Surgeon: Josephine Igo, DO;  Location: [facility];  Service: Pulmonary;;   COLONOSCOPY  01/23/2012   Procedure: COLONOSCOPY;  Surgeon: Corbin Ade, MD;  Location: [facility];  Service: Endoscopy;  Laterality: N/A;  9:30 AM   COLONOSCOPY N/A 10/11/2015   Procedure: COLONOSCOPY;  Surgeon: Corbin Ade, MD;  Location: [facility];  Service: Endoscopy;  Laterality: N/A;  830   COLONOSCOPY N/A 10/14/2019   Procedure: COLONOSCOPY;  Surgeon: Corbin Ade, MD;  Location: [facility];  Service: Endoscopy;  Laterality: N/A;  1:45   POLYPECTOMY  10/14/2019   Procedure: POLYPECTOMY;  Surgeon: Corbin Ade, MD;  Location: [facility];  Service: Endoscopy;;  ascending colon, descending colon   VIDEO BRONCHOSCOPY WITH ENDOBRONCHIAL ULTRASOUND N/A 04/05/2021   Procedure: VIDEO BRONCHOSCOPY WITH ENDOBRONCHIAL ULTRASOUND;  Surgeon: Josephine Igo, DO;  Location: [facility];  Service: Pulmonary;  Laterality: N/A;    REVIEW OF SYSTEMS:   Review of Systems  Constitutional: Negative for appetite change, chills, fatigue, fever and unexpected weight change.  HENT:   Negative for mouth sores, nosebleeds, sore throat and trouble swallowing.   Eyes: Negative for eye problems and icterus.  Respiratory: Negative for cough, hemoptysis, shortness of breath and wheezing.   Cardiovascular: Negative for chest pain and leg swelling.  Gastrointestinal: Negative for abdominal pain, constipation, diarrhea, nausea and vomiting.  Genitourinary: Negative for bladder incontinence, difficulty urinating, dysuria, frequency and hematuria.   Musculoskeletal: Negative for back pain, gait problem, neck pain and neck stiffness.  Skin: Negative for itching and rash.  Neurological: Negative for dizziness, extremity weakness, gait problem, headaches, light-headedness and seizures.  Hematological: Negative for adenopathy. Does not bruise/bleed easily.  Psychiatric/Behavioral: Negative for confusion, depression and sleep disturbance. The patient is not nervous/anxious.     PHYSICAL EXAMINATION:  Blood pressure 134/79, pulse 99, temperature 98.6 F (37 C), temperature source Oral, resp. rate 17, height  5\' 11"  (1.803 m), weight 184 lb (83.5 kg), SpO2 100%.  ECOG PERFORMANCE STATUS: 1  Physical Exam  Constitutional: Oriented to person, place, and time and well-developed, well-nourished, and in no distress.  HENT:  Head: Normocephalic and atraumatic.  Mouth/Throat: Oropharynx is clear and moist. No oropharyngeal exudate.  Eyes: Conjunctivae are normal. Right eye exhibits no discharge. Left eye exhibits no discharge. No scleral icterus.  Neck: Normal range of motion. Neck supple.  Cardiovascular: Normal rate, regular rhythm, normal heart sounds and intact distal pulses.   Pulmonary/Chest: Effort normal and breath sounds normal. No respiratory distress. No  wheezes. No rales.  Abdominal: Soft. Bowel sounds are normal. Exhibits no distension and no mass. There is no tenderness.  Musculoskeletal: Normal range of motion. Improved edema.  Lymphadenopathy:    No cervical adenopathy.  Neurological: Alert and oriented to person, place, and time. Exhibits normal muscle tone. Gait normal. Coordination normal.  Skin: Blister on right lower leg. Skin is warm and dry. No rash noted. Not diaphoretic. No erythema. No pallor.  Psychiatric: Mood, memory and judgment normal.  Vitals reviewed.    LABORATORY DATA: Lab Results  Component Value Date   WBC 11.3 (H) 05/28/2023   HGB 13.6 05/28/2023   HCT 45.9 05/28/2023   MCV 85.3 05/28/2023   PLT 146 (L) 05/28/2023      Chemistry      Component Value Date/Time   NA 143 05/28/2023 1048   NA 144 01/10/2021 0849   K 4.3 05/28/2023 1048   CL 108 05/28/2023 1048   CO2 28 05/28/2023 1048   BUN 15 05/28/2023 1048   BUN 15 01/10/2021 0849   CREATININE 1.01 05/28/2023 1048      Component Value Date/Time   CALCIUM 9.2 05/28/2023 1048   ALKPHOS 60 05/28/2023 1048   AST 23 05/28/2023 1048   ALT 19 05/28/2023 1048   BILITOT 0.3 05/28/2023 1048       RADIOGRAPHIC STUDIES:  CT Chest W Contrast  Result Date: 05/28/2023 CLINICAL DATA:  Is non-small cell lung cancer staging. * Tracking Code: BO * EXAM: CT CHEST, ABDOMEN, AND PELVIS WITH CONTRAST TECHNIQUE: Multidetector CT imaging of the chest, abdomen and pelvis was performed following the standard protocol during bolus administration of intravenous contrast. RADIATION DOSE REDUCTION: This exam was performed according to the departmental dose-optimization program which includes automated exposure control, adjustment of the mA and/or kV according to patient size and/or use of iterative reconstruction technique. CONTRAST:  OMNIPAQUE IOHEXOL 300 MG/ML  SOLN COMPARISON:  02/11/2023 FINDINGS: CT CHEST FINDINGS Cardiovascular: Small pericardial effusion.  Mediastinum/Nodes: No axillary or supraclavicular adenopathy. No mediastinal or hilar adenopathy. Esophagus normal. Lungs/Pleura: Scattered small nodules unchanged. Example RIGHT upper lobe nodule measuring 7 mm on image 50/6 unchanged from 7 mm. RIGHT upper lobe ill-defined nodule measuring 5 mm on image 67 is unchanged. Posterior RIGHT upper lobe nodule measuring 5 mm image 45 is unchanged. No new pulmonary nodules. Musculoskeletal: No aggressive osseous lesion. CT ABDOMEN AND PELVIS FINDINGS Hepatobiliary: Hypodense lesion in the inferior RIGHT hepatic lobe measuring 7 mm unchanged. Fat density lesion in the dome liver unchanged. No new hepatic lesions. Pancreas: Pancreas is normal. No ductal dilatation. No pancreatic inflammation. Spleen: Normal spleen Adrenals/urinary tract: Adrenal glands and kidneys are normal. Nonobstructing LEFT renal calculus again noted. The ureters and bladder normal. Stomach/Bowel: Stomach, small bowel, appendix, and cecum are normal. The colon and rectosigmoid colon are normal. Vascular/Lymphatic: Abdominal aorta is normal caliber with atherosclerotic calcification. There is no  retroperitoneal or periportal lymphadenopathy. No pelvic lymphadenopathy. Reproductive: Prostate normal Other: No free fluid. Musculoskeletal: No aggressive osseous lesion. IMPRESSION: CHEST: No evidence of thoracic metastatic disease. Stable small RIGHT lung pulmonary nodules. PELVIS: No evidence of metastatic disease in the abdomen pelvis. Electronically Signed   By: Genevive Bi M.D.   On: 05/28/2023 10:44   CT ABDOMEN PELVIS W CONTRAST  Result Date: 05/28/2023 CLINICAL DATA:  Is non-small cell lung cancer staging. * Tracking Code: BO * EXAM: CT CHEST, ABDOMEN, AND PELVIS WITH CONTRAST TECHNIQUE: Multidetector CT imaging of the chest, abdomen and pelvis was performed following the standard protocol during bolus administration of intravenous contrast. RADIATION DOSE REDUCTION: This exam was performed  according to the departmental dose-optimization program which includes automated exposure control, adjustment of the mA and/or kV according to patient size and/or use of iterative reconstruction technique. CONTRAST:  OMNIPAQUE IOHEXOL 300 MG/ML  SOLN COMPARISON:  02/11/2023 FINDINGS: CT CHEST FINDINGS Cardiovascular: Small pericardial effusion. Mediastinum/Nodes: No axillary or supraclavicular adenopathy. No mediastinal or hilar adenopathy. Esophagus normal. Lungs/Pleura: Scattered small nodules unchanged. Example RIGHT upper lobe nodule measuring 7 mm on image 50/6 unchanged from 7 mm. RIGHT upper lobe ill-defined nodule measuring 5 mm on image 67 is unchanged. Posterior RIGHT upper lobe nodule measuring 5 mm image 45 is unchanged. No new pulmonary nodules. Musculoskeletal: No aggressive osseous lesion. CT ABDOMEN AND PELVIS FINDINGS Hepatobiliary: Hypodense lesion in the inferior RIGHT hepatic lobe measuring 7 mm unchanged. Fat density lesion in the dome liver unchanged. No new hepatic lesions. Pancreas: Pancreas is normal. No ductal dilatation. No pancreatic inflammation. Spleen: Normal spleen Adrenals/urinary tract: Adrenal glands and kidneys are normal. Nonobstructing LEFT renal calculus again noted. The ureters and bladder normal. Stomach/Bowel: Stomach, small bowel, appendix, and cecum are normal. The colon and rectosigmoid colon are normal. Vascular/Lymphatic: Abdominal aorta is normal caliber with atherosclerotic calcification. There is no retroperitoneal or periportal lymphadenopathy. No pelvic lymphadenopathy. Reproductive: Prostate normal Other: No free fluid. Musculoskeletal: No aggressive osseous lesion. IMPRESSION: CHEST: No evidence of thoracic metastatic disease. Stable small RIGHT lung pulmonary nodules. PELVIS: No evidence of metastatic disease in the abdomen pelvis. Electronically Signed   By: Genevive Bi M.D.   On: 05/28/2023 10:44     ASSESSMENT/PLAN:  This is a very pleasant  67 year old Caucasian male diagnosed with stage IV (T1b, N3, M1 C) non-small cell lung cancer, favoring adenocarcinoma.  The patient presented with a right upper lobe lung nodule in addition to right hilar, subcarinal, and bilateral mediastinal lymphadenopathy as well as supraclavicular lymphadenopathy.  The patient also has metastatic disease to the brain.  He was diagnosed in June 2022.  His PD-L1 expression is 80%.  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   The patient completed SRS to the brain lesions under the care of Dr. Kathrynn Running.  His last treatment was on 05/04/2021. 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    The patient is currently undergoing systemic chemotherapy with carboplatin for an AUC of 5, Alimta 500 mg per metered squared, Keytruda 200 mg IV every 3 weeks.  Status post 35 cycles. Starting from cycle #5, the patient started maintenance Alimta and Keytruda IV every 3 weeks.   The patient was seen with Dr. Arbutus Ped today.  Dr. Arbutus Ped personally and independently reviewed the scan and discussed results with the patient today.  The scan showed  no evidence of disease progression.   Since the patient had completed two years of immunotherapy, Dr. Arbutus Ped discussed the option of discontinuing immunotherapy and continue on single agent Alimta IV every 3 weeks versus continuing with Alimta and immunotherapy as the patient recently got notification of insurance approval until January 2025.  The patient opted to continue with both Alimta and Keytruda he is not have any adverse side effects from [facility] and he is concerned about discontinuing this since it has been effective for his cancer.  He will proceed with cycle #36 today.  We will see him back for a follow up in 3 weeks for evaluation and repeat blood work before starting cycle #37.   The  patient was advised to call immediately if he has any concerning symptoms in the interval. The patient voices understanding of current disease status and treatment options and is in agreement with the current care plan. All questions were answered. The patient knows to call the clinic with any problems, questions or concerns. We can certainly see the patient much sooner if necessary       Orders Placed This Encounter  Procedures   CBC with Differential (Cancer Center Only)    Standing Status:   Future    Standing Expiration Date:   08/19/2024   CMP (Cancer Center only)    Standing Status:   Future    Standing Expiration Date:   08/19/2024   CBC with Differential (Cancer Center Only)    Standing Status:   Future    Standing Expiration Date:   09/09/2024   CMP (Cancer Center only)    Standing Status:   Future    Standing Expiration Date:   09/09/2024   CBC with Differential (Cancer Center Only)    Standing Status:   Future    Standing Expiration Date:   09/30/2024   CMP (Cancer Center only)    Standing Status:   Future    Standing Expiration Date:   09/30/2024   CBC with Differential (Cancer Center Only)    Standing Status:   Future    Standing Expiration Date:   10/21/2024   CMP (Cancer Center only)    Standing Status:   Future    Standing Expiration Date:   10/21/2024     Johnette Abraham Caitlan Chauca, PA-C 05/28/23  ADDENDUM: Hematology/Oncology Attending: I had a face-to-face encounter with the patient today.  I reviewed his record, lab, scan and recommended his care plan.  This is a very pleasant 67 years old white male with a stage IV non-small cell lung cancer, adenocarcinoma diagnosed in June 2022 with positive KRAS G12C mutation and PD-L1 expression of 80%.  He is status post SRS to brain metastasis under the care of Dr. Kathrynn Running. The patient started systemic chemotherapy initially with induction carboplatin, Alimta and Keytruda for 4 cycles.  He has been on maintenance  treatment with Alimta and Keytruda starting from cycle #5 and currently status post 35 cycles.  He has been tolerating this treatment well with no concerning adverse effects. He had repeat CT scan of the chest, abdomen and pelvis performed recently.  I personally and independently reviewed the scan and discussed the result with the patient and his wife. His scan showed no concerning findings for disease progression. I recommended for the patient to continue his current maintenance therapy with the 2 agents for now. I will see him back for follow-up visit in 3 weeks for evaluation before the next cycle of his treatment. The  patient was advised to call immediately if he has any other concerning symptoms in the interval. The total time spent in the appointment was 30 minutes. Disclaimer: This note was dictated with voice recognition software. Similar sounding words can inadvertently be transcribed and may be missed upon review. Lajuana Matte, MD

## 2023-05-23 ENCOUNTER — Ambulatory Visit (HOSPITAL_COMMUNITY)
Admission: RE | Admit: 2023-05-23 | Discharge: 2023-05-23 | Disposition: A | Discharge status: Home / Self Care | Payer: Commercial Managed Care - PPO | Source: Ambulatory Visit | Attending: Internal Medicine | Admitting: Internal Medicine

## 2023-05-23 DIAGNOSIS — K769 Liver disease, unspecified: Secondary | ICD-10-CM | POA: Diagnosis not present

## 2023-05-23 DIAGNOSIS — C3490 Malignant neoplasm of unspecified part of unspecified bronchus or lung: Secondary | ICD-10-CM | POA: Insufficient documentation

## 2023-05-23 DIAGNOSIS — I3139 Other pericardial effusion (noninflammatory): Secondary | ICD-10-CM | POA: Diagnosis not present

## 2023-05-23 DIAGNOSIS — N200 Calculus of kidney: Secondary | ICD-10-CM | POA: Diagnosis not present

## 2023-05-23 MED ORDER — IOHEXOL 300 MG/ML  SOLN
100.0000 mL | Freq: Once | INTRAMUSCULAR | Status: AC | PRN
  Administered 2023-05-23: 100 mL via INTRAVENOUS

## 2023-05-23 MED ORDER — SODIUM CHLORIDE (PF) 0.9 % IJ SOLN
INTRAMUSCULAR | Status: AC
  Filled 2023-05-23: qty 50

## 2023-05-28 ENCOUNTER — Inpatient Hospital Stay: Payer: Commercial Managed Care - PPO

## 2023-05-28 ENCOUNTER — Inpatient Hospital Stay: Payer: Commercial Managed Care - PPO | Attending: Physician Assistant

## 2023-05-28 ENCOUNTER — Inpatient Hospital Stay: Payer: Commercial Managed Care - PPO | Admitting: Physician Assistant

## 2023-05-28 VITALS — BP 134/79 | HR 99 | Temp 98.6°F | Resp 17 | Ht 71.0 in | Wt 184.0 lb

## 2023-05-28 DIAGNOSIS — Z5111 Encounter for antineoplastic chemotherapy: Secondary | ICD-10-CM | POA: Insufficient documentation

## 2023-05-28 DIAGNOSIS — C7931 Secondary malignant neoplasm of brain: Secondary | ICD-10-CM | POA: Insufficient documentation

## 2023-05-28 DIAGNOSIS — Z79899 Other long term (current) drug therapy: Secondary | ICD-10-CM | POA: Diagnosis not present

## 2023-05-28 DIAGNOSIS — C3411 Malignant neoplasm of upper lobe, right bronchus or lung: Secondary | ICD-10-CM | POA: Insufficient documentation

## 2023-05-28 DIAGNOSIS — F17210 Nicotine dependence, cigarettes, uncomplicated: Secondary | ICD-10-CM | POA: Diagnosis not present

## 2023-05-28 DIAGNOSIS — C3491 Malignant neoplasm of unspecified part of right bronchus or lung: Secondary | ICD-10-CM

## 2023-05-28 DIAGNOSIS — E230 Hypopituitarism: Secondary | ICD-10-CM | POA: Diagnosis not present

## 2023-05-28 DIAGNOSIS — Z5112 Encounter for antineoplastic immunotherapy: Secondary | ICD-10-CM | POA: Insufficient documentation

## 2023-05-28 DIAGNOSIS — C7951 Secondary malignant neoplasm of bone: Secondary | ICD-10-CM | POA: Diagnosis not present

## 2023-05-28 DIAGNOSIS — Z79620 Long term (current) use of immunosuppressive biologic: Secondary | ICD-10-CM | POA: Insufficient documentation

## 2023-05-28 LAB — CMP (CANCER CENTER ONLY)
ALT: 19 U/L (ref 0–44)
AST: 23 U/L (ref 15–41)
Albumin: 4.1 g/dL (ref 3.5–5.0)
Alkaline Phosphatase: 60 U/L (ref 38–126)
Anion gap: 7 (ref 5–15)
BUN: 15 mg/dL (ref 8–23)
CO2: 28 mmol/L (ref 22–32)
Calcium: 9.2 mg/dL (ref 8.9–10.3)
Chloride: 108 mmol/L (ref 98–111)
Creatinine: 1.01 mg/dL (ref 0.61–1.24)
GFR, Estimated: >60 mL/min (ref >60)
Glucose, Bld: 105 mg/dL — ABNORMAL HIGH (ref 70–99)
Potassium: 4.3 mmol/L (ref 3.5–5.1)
Sodium: 143 mmol/L (ref 135–145)
Total Bilirubin: 0.3 mg/dL (ref 0.3–1.2)
Total Protein: 7.3 g/dL (ref 6.5–8.1)

## 2023-05-28 LAB — CBC WITH DIFFERENTIAL (CANCER CENTER ONLY)
Abs Immature Granulocytes: 0.08 10*3/uL — ABNORMAL HIGH (ref 0.00–0.07)
Basophils Absolute: 0.1 10*3/uL (ref 0.0–0.1)
Basophils Relative: 1 %
Eosinophils Absolute: 0.1 10*3/uL (ref 0.0–0.5)
Eosinophils Relative: 1 %
HCT: 45.9 % (ref 39.0–52.0)
Hemoglobin: 13.6 g/dL (ref 13.0–17.0)
Immature Granulocytes: 1 %
Lymphocytes Relative: 11 %
Lymphs Abs: 1.2 10*3/uL (ref 0.7–4.0)
MCH: 25.3 pg — ABNORMAL LOW (ref 26.0–34.0)
MCHC: 29.6 g/dL — ABNORMAL LOW (ref 30.0–36.0)
MCV: 85.3 fL (ref 80.0–100.0)
Monocytes Absolute: 0.5 10*3/uL (ref 0.1–1.0)
Monocytes Relative: 4 %
Neutro Abs: 9.4 10*3/uL — ABNORMAL HIGH (ref 1.7–7.7)
Neutrophils Relative %: 82 %
Platelet Count: 146 10*3/uL — ABNORMAL LOW (ref 150–400)
RBC: 5.38 MIL/uL (ref 4.22–5.81)
RDW: 19.6 % — ABNORMAL HIGH (ref 11.5–15.5)
WBC Count: 11.3 10*3/uL — ABNORMAL HIGH (ref 4.0–10.5)
nRBC: 0 % (ref 0.0–0.2)

## 2023-05-28 MED ORDER — SODIUM CHLORIDE 0.9 % IV SOLN: Freq: Once | INTRAVENOUS | Status: AC

## 2023-05-28 MED ORDER — PROCHLORPERAZINE MALEATE 10 MG PO TABS
10.0000 mg | ORAL_TABLET | Freq: Once | ORAL | Status: AC
  Administered 2023-05-28: 10 mg via ORAL
  Filled 2023-05-28: qty 1

## 2023-05-28 MED ORDER — CYANOCOBALAMIN 1000 MCG/ML IJ SOLN
1000.0000 ug | Freq: Once | INTRAMUSCULAR | Status: AC
  Administered 2023-05-28: 1000 ug via INTRAMUSCULAR
  Filled 2023-05-28: qty 1

## 2023-05-28 MED ORDER — SODIUM CHLORIDE 0.9 % IV SOLN
500.0000 mg/m2 | Freq: Once | INTRAVENOUS | Status: AC
  Administered 2023-05-28: 1000 mg via INTRAVENOUS
  Filled 2023-05-28: qty 40

## 2023-05-28 MED ORDER — SODIUM CHLORIDE 0.9 % IV SOLN
200.0000 mg | Freq: Once | INTRAVENOUS | Status: AC
  Administered 2023-05-28: 200 mg via INTRAVENOUS
  Filled 2023-05-28: qty 200

## 2023-05-28 NOTE — Patient Instructions (Signed)
[facility]  Discharge Instructions: Thank you for choosing [facility] to provide your oncology and hematology care.   If you have a lab appointment with the Cancer Center, please go directly to the Cancer Center and check in at the registration area.   Wear comfortable clothing and clothing appropriate for easy access to any Portacath or PICC line.   We strive to give you quality time with your provider. You may need to reschedule your appointment if you arrive late (15 or more minutes).  Arriving late affects you and other patients whose appointments are after yours.  Also, if you miss three or more appointments without notifying the office, you may be dismissed from the clinic at the provider's discretion.      For prescription refill requests, have your pharmacy contact our office and allow 72 hours for refills to be completed.    Today you received the following chemotherapy and/or immunotherapy agents: Keytruda and Alimta      To help prevent nausea and vomiting after your treatment, we encourage you to take your nausea medication as directed.  BELOW ARE SYMPTOMS THAT SHOULD BE REPORTED IMMEDIATELY: *FEVER GREATER THAN 100.4 F (38 C) OR HIGHER *CHILLS OR SWEATING *NAUSEA AND VOMITING THAT IS NOT CONTROLLED WITH YOUR NAUSEA MEDICATION *UNUSUAL SHORTNESS OF BREATH *UNUSUAL BRUISING OR BLEEDING *URINARY PROBLEMS (pain or burning when urinating, or frequent urination) *BOWEL PROBLEMS (unusual diarrhea, constipation, pain near the anus) TENDERNESS IN MOUTH AND THROAT WITH OR WITHOUT PRESENCE OF ULCERS (sore throat, sores in mouth, or a toothache) UNUSUAL RASH, SWELLING OR PAIN  UNUSUAL VAGINAL DISCHARGE OR ITCHING   Items with * indicate a potential emergency and should be followed up as soon as possible or go to the Emergency Department if any problems should occur.  Please show the CHEMOTHERAPY ALERT CARD or IMMUNOTHERAPY ALERT  CARD at check-in to the Emergency Department and triage nurse.  Should you have questions after your visit or need to cancel or reschedule your appointment, please contact [facility]  Dept: [phone]  and follow the prompts.  Office hours are 8:00 a.m. to 4:30 p.m. Monday - Friday. Please note that voicemails left after 4:00 p.m. may not be returned until the following business day.  We are closed weekends and major holidays. You have access to a nurse at all times for urgent questions. Please call the main number to the clinic Dept: [phone] and follow the prompts.   For any non-urgent questions, you may also contact your provider using MyChart. We now offer e-Visits for anyone 18 and older to request care online for non-urgent symptoms. For details visit [email or web address].   Also download the MyChart app! Go to the app store, search "MyChart", open the app, select [facility], and log in with your MyChart username and password.   

## 2023-05-30 ENCOUNTER — Other Ambulatory Visit (HOSPITAL_COMMUNITY): Payer: Self-pay

## 2023-05-30 ENCOUNTER — Other Ambulatory Visit: Payer: Self-pay

## 2023-05-31 ENCOUNTER — Encounter: Payer: Commercial Managed Care - PPO | Admitting: Family Medicine

## 2023-06-07 ENCOUNTER — Ambulatory Visit (INDEPENDENT_AMBULATORY_CARE_PROVIDER_SITE_OTHER): Payer: Commercial Managed Care - PPO | Admitting: Family Medicine

## 2023-06-07 ENCOUNTER — Encounter: Payer: Self-pay | Admitting: Family Medicine

## 2023-06-07 VITALS — BP 111/75 | HR 94 | Temp 97.4°F | Ht 71.0 in | Wt 185.2 lb

## 2023-06-07 DIAGNOSIS — Z7985 Long-term (current) use of injectable non-insulin antidiabetic drugs: Secondary | ICD-10-CM

## 2023-06-07 DIAGNOSIS — E1169 Type 2 diabetes mellitus with other specified complication: Secondary | ICD-10-CM | POA: Diagnosis not present

## 2023-06-07 DIAGNOSIS — E039 Hypothyroidism, unspecified: Secondary | ICD-10-CM | POA: Diagnosis not present

## 2023-06-07 DIAGNOSIS — Z0001 Encounter for general adult medical examination with abnormal findings: Secondary | ICD-10-CM

## 2023-06-07 DIAGNOSIS — E1159 Type 2 diabetes mellitus with other circulatory complications: Secondary | ICD-10-CM

## 2023-06-07 DIAGNOSIS — Z125 Encounter for screening for malignant neoplasm of prostate: Secondary | ICD-10-CM

## 2023-06-07 DIAGNOSIS — I152 Hypertension secondary to endocrine disorders: Secondary | ICD-10-CM

## 2023-06-07 DIAGNOSIS — Z0000 Encounter for general adult medical examination without abnormal findings: Secondary | ICD-10-CM | POA: Diagnosis not present

## 2023-06-07 DIAGNOSIS — E559 Vitamin D deficiency, unspecified: Secondary | ICD-10-CM

## 2023-06-07 DIAGNOSIS — E785 Hyperlipidemia, unspecified: Secondary | ICD-10-CM

## 2023-06-07 NOTE — Progress Notes (Signed)
Complete physical exam  Patient: Bradley Goodman   DOB: [birth year]   67 y.o. Male  MRN: [id number]  Subjective:    Chief Complaint  Patient presents with   Annual Exam    Bradley Goodman is a 67 y.o. male who presents today for a complete physical exam. He reports consuming a general diet. The patient does not participate in regular exercise at present. He generally feels fairly well. He reports sleeping fairly well. He does not have additional problems to discuss today.  He is currently undergoing chemotherapy and tolerating well. He has increased his protein intake and this has helped with his lower extremity swelling.   Most recent fall risk assessment:    06/07/2023    8:12 AM  Fall Risk   Falls in the past year? 0     Most recent depression screenings:    06/07/2023    8:12 AM 04/16/2023    9:04 AM  PHQ 2/9 Scores  PHQ - 2 Score 1 1  PHQ- 9 Score 2 3    Vision:Not within last year  and Dental: No regular dental care   Patient Active Problem List   Diagnosis Date Noted   Current smoker 06/14/2022   Recurrent major depression in remission (HCC) 04/26/2022   Type 2 diabetes mellitus with hyperglycemia, without long-term current use of insulin (HCC) 01/10/2022   Adrenal insufficiency (HCC) 06/27/2021   Hypogonadism, male 06/27/2021   Acquired hypothyroidism 06/27/2021   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   Adenocarcinoma of right lung, stage 4 (HCC) 04/25/2021   Encounter for antineoplastic chemotherapy 04/25/2021   Encounter for antineoplastic immunotherapy 04/25/2021   Malignant neoplasm metastatic to brain (HCC) 04/05/2021   Adenopathy 04/03/2021   Tobacco use 07/14/2020   Hyperlipidemia associated with type 2 diabetes mellitus (HCC) 11/09/2019   Vitamin D deficiency 07/08/2019   Depression, recurrent (HCC) 07/08/2019   History of colonic polyps    History of adenomatous polyp of colon 09/14/2015   GERD (gastroesophageal reflux disease) 06/24/2013   Hypertension  associated with type 2 diabetes mellitus (HCC) 06/24/2013   Past Medical History:  Diagnosis Date   Cervical spondylolysis    Essential hypertension    GERD (gastroesophageal reflux disease)    History of kidney stones    History of migraine    Hyperlipidemia    Hypertension    PONV (postoperative nausea and vomiting)    Type 2 diabetes mellitus (HCC)    Past Surgical History:  Procedure Laterality Date   Bilateral inguinal hernia repair     BRONCHIAL NEEDLE ASPIRATION BIOPSY  04/05/2021   Procedure: BRONCHIAL NEEDLE ASPIRATION BIOPSIES;  Surgeon: Josephine Igo, DO;  Location: [facility];  Service: Pulmonary;;   COLONOSCOPY  01/23/2012   Procedure: COLONOSCOPY;  Surgeon: Corbin Ade, MD;  Location: [facility];  Service: Endoscopy;  Laterality: N/A;  9:30 AM   COLONOSCOPY N/A 10/11/2015   Procedure: COLONOSCOPY;  Surgeon: Corbin Ade, MD;  Location: [facility];  Service: Endoscopy;  Laterality: N/A;  830   COLONOSCOPY N/A 10/14/2019   Procedure: COLONOSCOPY;  Surgeon: Corbin Ade, MD;  Location: [facility];  Service: Endoscopy;  Laterality: N/A;  1:45   POLYPECTOMY  10/14/2019   Procedure: POLYPECTOMY;  Surgeon: Corbin Ade, MD;  Location: [facility];  Service: Endoscopy;;  ascending colon, descending colon   VIDEO BRONCHOSCOPY WITH ENDOBRONCHIAL ULTRASOUND N/A 04/05/2021   Procedure: VIDEO BRONCHOSCOPY WITH ENDOBRONCHIAL ULTRASOUND;  Surgeon: Josephine Igo, DO;  Location: [facility];  Service: Pulmonary;  Laterality: N/A;   Social History   Tobacco Use   Smoking status: Every Day    Current packs/day: 0.50    Average packs/day: 0.5 packs/day for 49.6 years (24.8 ttl pk-yrs)    Types: Cigarettes    Start date: 10/30/1973   Smokeless tobacco: Never  Vaping Use   Vaping status: Never Used  Substance Use Topics   Alcohol use: Not Currently    Comment: One drink every 6 months.   Drug use: No   Social History   Socioeconomic History   Marital  status: Married    Spouse name: Not on file   Number of children: Not on file   Years of education: Not on file   Highest education level: Bachelor's degree (e.g., BA, AB, BS)  Occupational History   Not on file  Tobacco Use   Smoking status: Every Day    Current packs/day: 0.50    Average packs/day: 0.5 packs/day for 49.6 years (24.8 ttl pk-yrs)    Types: Cigarettes    Start date: 10/30/1973   Smokeless tobacco: Never  Vaping Use   Vaping status: Never Used  Substance and Sexual Activity   Alcohol use: Not Currently    Comment: One drink every 6 months.   Drug use: No   Sexual activity: Yes  Other Topics Concern   Not on file  Social History Narrative   Not on file   Social Determinants of Health   Financial Resource Strain: Patient Declined (04/15/2023)   Overall Financial Resource Strain (CARDIA)    Difficulty of Paying Living Expenses: Patient declined  Food Insecurity: No Food Insecurity (04/15/2023)   Hunger Vital Sign    Worried About Running Out of Food in the Last Year: Never true    Ran Out of Food in the Last Year: Never true  Transportation Needs: No Transportation Needs (04/15/2023)   PRAPARE - Administrator, Civil Service (Medical): No    Lack of Transportation (Non-Medical): No  Physical Activity: Unknown (04/15/2023)   Exercise Vital Sign    Days of Exercise per Week: 1 day    Minutes of Exercise per Session: Patient declined  Stress: No Stress Concern Present (04/15/2023)   [employer] of Occupational Health - Occupational Stress Questionnaire    Feeling of Stress : Only a little  Social Connections: Unknown (04/15/2023)   Social Connection and Isolation Panel [NHANES]    Frequency of Communication with Friends and Family: Patient declined    Frequency of Social Gatherings with Friends and Family: Patient declined    Attends Religious Services: Not on Marketing executive or Organizations: No    Attends Engineer, structural:  Not on file    Marital Status: Married  Catering manager Violence: Not on file   Family Status  Relation Name Status   Mother  Deceased   Father  Deceased   Brother  Alive   Neg Hx  (Not Specified)  No partnership data on file   Family History  Problem Relation Age of Onset   Hypertension Mother    Diabetes Mother    Heart attack Mother    Hypertension Father    Heart attack Father    Heart attack Brother    Colon cancer Neg Hx    No Known Allergies    Patient Care Team: Swetha Rayle, Doralee Albino, FNP as PCP - General (Family Medicine) Jena Gauss, Gerrit Friends, MD as  Consulting Physician (Gastroenterology) Delora Fuel, OD [facility])   Outpatient Medications Prior to Visit  Medication Sig   cholecalciferol (VITAMIN D) 1000 UNITS tablet Take 1,000 Units by mouth every evening.   Continuous Glucose Sensor (FREESTYLE LIBRE 3 SENSOR) MISC Place 1 sensor on the skin every 14 days. Use to check glucose continuously   dexamethasone (DECADRON) 1 MG tablet Take 1 tablet (1 mg total) by mouth daily with breakfast.   docusate sodium (COLACE) 100 MG capsule Take 100 mg by mouth daily.   folic acid (FOLVITE) 1 MG tablet Take 1 tablet by mouth daily.   Krill Oil 300 MG CAPS Take 300 mg by mouth every evening.   levothyroxine (SYNTHROID) 88 MCG tablet Take 1 tablet (88 mcg total) by mouth daily before breakfast.   loratadine (CLARITIN) 10 MG tablet Take 10 mg by mouth every evening.    losartan (COZAAR) 50 MG tablet Take 1.5 tablets (75 mg total) by mouth daily.   mirtazapine (REMERON) 15 MG tablet Take 1 tablet (15 mg total) by mouth at bedtime.   Multiple Vitamins-Minerals (MULTIVITAMIN GUMMIES ADULT PO) Take 2 each by mouth daily.   pantoprazole (PROTONIX) 40 MG tablet Take 1 tablet (40 mg total) by mouth every evening.   PRESCRIPTION MEDICATION Testosterone injection   prochlorperazine (COMPAZINE) 10 MG tablet Take 1 tablet (10 mg total) by mouth every 6 (six) hours as needed for nausea or vomiting.    rosuvastatin (CRESTOR) 20 MG tablet Take 1 tablet (20 mg total) by mouth daily.   Semaglutide,0.25 or 0.5MG /DOS, (OZEMPIC, 0.25 OR 0.5 MG/DOSE,) 2 MG/3ML SOPN Inject 0.5 mg into the skin once a week.   SYRINGE-NEEDLE, DISP, 3 ML 21G X 1-1/2" 3 ML MISC Use to inject testosterone every week   testosterone cypionate (DEPOTESTOTERONE CYPIONATE) 100 MG/ML injection Take 50mg  ( 0.66ml) and 100mg  ( 1ml) into muscle alternatively weekly.   venlafaxine XR (EFFEXOR-XR) 75 MG 24 hr capsule Take 1 capsule (75 mg total) by mouth daily with breakfast.   No facility-administered medications prior to visit.    Review of Systems  Constitutional:  Positive for malaise/fatigue. Negative for chills, diaphoresis, fever and weight loss.  Cardiovascular:  Negative for chest pain, palpitations, orthopnea, claudication, leg swelling and PND.  Genitourinary:  Negative for dysuria, flank pain, frequency, hematuria and urgency.  Neurological:  Negative for dizziness, tingling, tremors, sensory change, speech change, focal weakness, seizures, loss of consciousness, weakness and headaches.  All other systems reviewed and are negative.         Objective:     BP 111/75   Pulse 94   Temp (!) 97.4 F (36.3 C) (Temporal)   Ht 5\' 11"  (1.803 m)   Wt 185 lb 3.2 oz (84 kg)   SpO2 100%   BMI 25.83 kg/m  BP Readings from Last 3 Encounters:  06/07/23 111/75  05/28/23 134/79  05/07/23 126/78   Wt Readings from Last 3 Encounters:  06/07/23 185 lb 3.2 oz (84 kg)  05/28/23 184 lb (83.5 kg)  05/07/23 190 lb (86.2 kg)   SpO2 Readings from Last 3 Encounters:  06/07/23 100%  05/28/23 100%  05/06/23 100%      Physical Exam Vitals and nursing note reviewed.  Constitutional:      General: He is not in acute distress.    Appearance: Normal appearance. He is well-developed and well-groomed. He is not ill-appearing, toxic-appearing or diaphoretic.  HENT:     Head: Normocephalic and atraumatic.     Jaw: There  is  normal jaw occlusion.     Right Ear: Hearing, tympanic membrane, ear canal and external ear normal.     Left Ear: Hearing, tympanic membrane, ear canal and external ear normal.     Nose: Nose normal.     Mouth/Throat:     Lips: Pink.     Mouth: Mucous membranes are moist.     Pharynx: Oropharynx is clear. Uvula midline.  Eyes:     General: Lids are normal.     Extraocular Movements: Extraocular movements intact.     Conjunctiva/sclera: Conjunctivae normal.     Pupils: Pupils are equal, round, and reactive to light.  Neck:     Thyroid: No thyroid mass, thyromegaly or thyroid tenderness.     Vascular: No carotid bruit or JVD.     Trachea: Trachea and phonation normal.  Cardiovascular:     Rate and Rhythm: Normal rate and regular rhythm.     Chest Wall: PMI is not displaced.     Pulses: Normal pulses.     Heart sounds: Normal heart sounds. No murmur heard.    No friction rub. No gallop.  Pulmonary:     Effort: Pulmonary effort is normal. No respiratory distress.     Breath sounds: Normal breath sounds. No wheezing.  Abdominal:     General: Bowel sounds are normal. There is no distension or abdominal bruit.     Palpations: Abdomen is soft. There is no hepatomegaly or splenomegaly.     Tenderness: There is no abdominal tenderness. There is no right CVA tenderness or left CVA tenderness.     Hernia: No hernia is present.  Musculoskeletal:        General: Normal range of motion.     Cervical back: Normal range of motion and neck supple.     Right lower leg: No edema.     Left lower leg: No edema.  Lymphadenopathy:     Cervical: No cervical adenopathy.  Skin:    General: Skin is warm and dry.     Capillary Refill: Capillary refill takes less than 2 seconds.     Coloration: Skin is not cyanotic, jaundiced or pale.     Findings: No rash.  Neurological:     General: No focal deficit present.     Mental Status: He is alert and oriented to person, place, and time.     Sensory:  Sensation is intact.     Motor: Motor function is intact.     Coordination: Coordination is intact.     Gait: Gait is intact.     Deep Tendon Reflexes: Reflexes are normal and symmetric.  Psychiatric:        Attention and Perception: Attention and perception normal.        Mood and Affect: Mood and affect normal.        Speech: Speech normal.        Behavior: Behavior normal. Behavior is cooperative.        Thought Content: Thought content normal.        Cognition and Memory: Cognition and memory normal.        Judgment: Judgment normal.      Last CBC Lab Results  Component Value Date   WBC 11.3 (H) 05/28/2023   HGB 13.6 05/28/2023   HCT 45.9 05/28/2023   MCV 85.3 05/28/2023   MCH 25.3 (L) 05/28/2023   RDW 19.6 (H) 05/28/2023   PLT 146 (L) 05/28/2023   Last metabolic panel Lab Results  Component Value Date   GLUCOSE 105 (H) 05/28/2023   NA 143 05/28/2023   K 4.3 05/28/2023   CL 108 05/28/2023   CO2 28 05/28/2023   BUN 15 05/28/2023   CREATININE 1.01 05/28/2023   GFRNONAA >60 05/28/2023   CALCIUM 9.2 05/28/2023   PROT 7.3 05/28/2023   ALBUMIN 4.1 05/28/2023   LABGLOB 2.1 01/10/2021   AGRATIO 2.0 01/10/2021   BILITOT 0.3 05/28/2023   ALKPHOS 60 05/28/2023   AST 23 05/28/2023   ALT 19 05/28/2023   ANIONGAP 7 05/28/2023   Last lipids Lab Results  Component Value Date   CHOL 95 12/10/2022   HDL 34 (L) 12/10/2022   LDLCALC 21 12/10/2022   TRIG 199 (H) 12/10/2022   CHOLHDL 2.8 12/10/2022   Last hemoglobin A1c Lab Results  Component Value Date   HGBA1C 6.0 (H) 04/16/2023   Last thyroid functions Lab Results  Component Value Date   TSH 0.768 04/29/2023   T4TOTAL 6.8 04/15/2023   Last vitamin D Lab Results  Component Value Date   VD25OH 58.9 01/10/2022   Last vitamin B12 and Folate Lab Results  Component Value Date   VITAMINB12 557 01/10/2022        Assessment & Plan:    Routine Health Maintenance and Physical Exam  Immunization History   Administered Date(s) Administered   Fluad Quad(high Dose 65+) 07/28/2022   Hepatitis B 02/06/1990, 10/28/1990, 04/02/1991   Influenza,inj,Quad PF,6+ Mos 07/07/2019, 07/31/2021   Influenza-Unspecified 07/25/2018   Moderna Sars-Covid-2 Vaccination 12/01/2019, 12/30/2019, 10/19/2020   Pneumococcal Conjugate-13 03/11/2017   Tdap 01/17/2016    Health Maintenance  Topic Date Due   Diabetic kidney evaluation - Urine ACR  10/19/2022   COVID-19 Vaccine (4 - 2023-24 season) 06/23/2023 (Originally 06/08/2022)   Zoster Vaccines- Shingrix (1 of 2) 07/17/2023 (Originally 06/13/1976)   Pneumonia Vaccine 76+ Years old (2 of 2 - PPSV23 or PCV20) 08/01/2023 (Originally 05/06/2017)   INFLUENZA VACCINE  01/06/2024 (Originally 05/09/2023)   HIV Screening  06/06/2024 (Originally 06/13/1971)   HEMOGLOBIN A1C  10/17/2023   OPHTHALMOLOGY EXAM  12/13/2023   FOOT EXAM  04/15/2024   Diabetic kidney evaluation - eGFR measurement  05/27/2024   DTaP/Tdap/Td (2 - Td or Tdap) 01/16/2026   Colonoscopy  10/13/2029   Hepatitis C Screening  Completed   HPV VACCINES  Aged Out    Discussed health benefits of physical activity, and encouraged him to engage in regular exercise appropriate for his age and condition.  Problem List Items Addressed This Visit       Cardiovascular and Mediastinum   Hypertension associated with type 2 diabetes mellitus (HCC)   Relevant Orders   CBC with Differential/Platelet   CMP14+EGFR     Endocrine   Hyperlipidemia associated with type 2 diabetes mellitus (HCC)   Relevant Orders   CMP14+EGFR   Lipid panel   Acquired hypothyroidism   Relevant Orders   Thyroid Panel With TSH     Other   Vitamin D deficiency   Relevant Orders   CMP14+EGFR   VITAMIN D 25 Hydroxy (Vit-D Deficiency, Fractures)   Other Visit Diagnoses     Annual physical exam    -  Primary   Relevant Orders   CBC with Differential/Platelet   CMP14+EGFR   Lipid panel   Thyroid Panel With TSH   PSA, total and free    Screening for prostate cancer       Relevant Orders   PSA, total and free  Return in about 1 year (around 06/06/2024) for CPE.      The above assessment and management plan was discussed with the patient. The patient verbalized understanding of and has agreed to the management plan. Patient is aware to call the clinic if they develop any new symptoms or if symptoms fail to improve or worsen. Patient is aware when to return to the clinic for a follow-up visit. Patient educated on when it is appropriate to go to the emergency department.   Kari Baars, FNP-C [facility] [address] [place], [place] [address] [phone]

## 2023-06-08 ENCOUNTER — Other Ambulatory Visit: Payer: Self-pay

## 2023-06-08 ENCOUNTER — Encounter: Payer: Self-pay | Admitting: Internal Medicine

## 2023-06-08 LAB — LIPID PANEL
Chol/HDL Ratio: 3.7 ratio (ref 0.0–5.0)
Cholesterol, Total: 114 mg/dL (ref 100–199)
HDL: 31 mg/dL — ABNORMAL LOW (ref >39)
LDL Chol Calc (NIH): 43 mg/dL (ref 0–99)
Triglycerides: 259 mg/dL — ABNORMAL HIGH (ref 0–149)
VLDL Cholesterol Cal: 40 mg/dL (ref 5–40)

## 2023-06-08 LAB — CMP14+EGFR
ALT: 26 IU/L (ref 0–44)
AST: 26 IU/L (ref 0–40)
Albumin: 3.9 g/dL (ref 3.9–4.9)
Alkaline Phosphatase: 70 IU/L (ref 44–121)
BUN/Creatinine Ratio: 15 (ref 10–24)
BUN: 19 mg/dL (ref 8–27)
Bilirubin Total: 0.2 mg/dL (ref 0.0–1.2)
CO2: 26 mmol/L (ref 20–29)
Calcium: 9.5 mg/dL (ref 8.6–10.2)
Chloride: 103 mmol/L (ref 96–106)
Creatinine, Ser: 1.24 mg/dL (ref 0.76–1.27)
Globulin, Total: 2.3 g/dL (ref 1.5–4.5)
Glucose: 98 mg/dL (ref 70–99)
Potassium: 4.2 mmol/L (ref 3.5–5.2)
Sodium: 145 mmol/L — ABNORMAL HIGH (ref 134–144)
Total Protein: 6.2 g/dL (ref 6.0–8.5)
eGFR: 65 mL/min/{1.73_m2} (ref >59)

## 2023-06-08 LAB — CBC WITH DIFFERENTIAL/PLATELET
Basophils Absolute: 0.1 10*3/uL (ref 0.0–0.2)
Basos: 1 %
EOS (ABSOLUTE): 0.2 10*3/uL (ref 0.0–0.4)
Eos: 2 %
Hematocrit: 41.6 % (ref 37.5–51.0)
Hemoglobin: 12.1 g/dL — ABNORMAL LOW (ref 13.0–17.7)
Immature Grans (Abs): 0.2 10*3/uL — ABNORMAL HIGH (ref 0.0–0.1)
Immature Granulocytes: 2 %
Lymphocytes Absolute: 2.6 10*3/uL (ref 0.7–3.1)
Lymphs: 26 %
MCH: 25 pg — ABNORMAL LOW (ref 26.6–33.0)
MCHC: 29.1 g/dL — ABNORMAL LOW (ref 31.5–35.7)
MCV: 86 fL (ref 79–97)
Monocytes Absolute: 1.1 10*3/uL — ABNORMAL HIGH (ref 0.1–0.9)
Monocytes: 11 %
Neutrophils Absolute: 5.9 10*3/uL (ref 1.4–7.0)
Neutrophils: 58 %
Platelets: 205 10*3/uL (ref 150–450)
RBC: 4.84 x10E6/uL (ref 4.14–5.80)
RDW: 18.2 % — ABNORMAL HIGH (ref 11.6–15.4)
WBC: 9.9 10*3/uL (ref 3.4–10.8)

## 2023-06-08 LAB — THYROID PANEL WITH TSH
Free Thyroxine Index: 1.9 (ref 1.2–4.9)
T3 Uptake Ratio: 26 % (ref 24–39)
T4, Total: 7.2 ug/dL (ref 4.5–12.0)
TSH: 0.384 u[IU]/mL — ABNORMAL LOW (ref 0.450–4.500)

## 2023-06-08 LAB — PSA, TOTAL AND FREE
PSA, Free Pct: 14.5 %
PSA, Free: 0.42 ng/mL
Prostate Specific Ag, Serum: 2.9 ng/mL (ref 0.0–4.0)

## 2023-06-08 LAB — VITAMIN D 25 HYDROXY (VIT D DEFICIENCY, FRACTURES): Vit D, 25-Hydroxy: 61.7 ng/mL (ref 30.0–100.0)

## 2023-06-14 ENCOUNTER — Other Ambulatory Visit: Payer: Self-pay

## 2023-06-14 ENCOUNTER — Other Ambulatory Visit (HOSPITAL_COMMUNITY): Payer: Self-pay

## 2023-06-14 NOTE — Progress Notes (Unsigned)
[facility] OFFICE PROGRESS NOTE  Bradley Masters, FNP [address] [place] [address]  DIAGNOSIS: Stage IV (T1b, N3, M1C) non-small cell lung cancer, favoring adenocarcinoma presented with right upper lobe lung nodule in addition to right hilar, subcarinal and bilateral mediastinal as well as supraclavicular lymphadenopathy in addition to bone and brain metastasis diagnosed in June 2022.   PD-L1 expression 80%.     Molecular Studies:  Biomarker Findings Microsatellite status - MS-Stable Tumor Mutational Burden - 6 Muts/Mb Genomic Findings For a complete list of the genes assayed, please refer to the Appendix. KRAS G12C, amplification ATM S470* CCND1 amplification - equivocal? HGF amplification - equivocal? MYC amplification - equivocal? FGF19 amplification - equivocal? FGF3 amplification - equivocal? FGF4 amplification - equivocal? NFKBIA amplification NKX2-1 amplification RAD21 amplification - equivocal? [id number]*26 TERT promoter -124C>T TP53 rearrangement exon 9 7 Disease relevant genes with no reportable alterations: ALK, BRAF, EGFR, ERBB2, MET, RET, ROS1   PRIOR THERAPY: SRS to the metastatic brain lesions under the care of Dr. Kathrynn Running.  Last treatment on 05/04/2021.     CURRENT THERAPY:  Palliative systemic chemotherapy with carboplatin for an AUC 5, Alimta 500 mg/m2 and, Keytruda 200 mg IV every 3 weeks.  First dose expected on 05/08/2021.  Status post 36 cycles.  Starting from cycle #5 he is on maintenance treatment with Alimta and Keytruda every 3 weeks.    INTERVAL HISTORY: Bradley Goodman 67 y.o. male returns to the clinic today for a follow-up visit accompanied by his wife. The patient was last seen by myself and Dr. Arbutus Ped 3 weeks ago.  He had a restaging CT scan at that time.  He was offered to continue on single agent chemotherapy with Alimta or continue with maintenance Alimta and immunotherapy with Keytruda.  He opted to continue on  his current treatment with Alimta and Keytruda. They are wondering if he can go to the dentist and have dental cleanings. He may need some dental work in the future as well. Today, he denies any fever, chills, or night sweats. At his last appointment, he was endorsing a new blister on the right lower extremity which is improving and has scabbed over.. Denies any dyspnea, cough, chest pain, or hemoptysis.  Denies any nausea, vomiting, diarrhea, or constipation. He denies unusual headaches.  Denies rashes and skin changes except for easy bruising and the blister. He is here today for evaluation repeat blood work before undergoing cycle #37.   MEDICAL HISTORY: Past Medical History:  Diagnosis Date   Cervical spondylolysis    Essential hypertension    GERD (gastroesophageal reflux disease)    History of kidney stones    History of migraine    Hyperlipidemia    Hypertension    PONV (postoperative nausea and vomiting)    Type 2 diabetes mellitus (HCC)     ALLERGIES:  has No Known Allergies.  MEDICATIONS:  Current Outpatient Medications  Medication Sig Dispense Refill   cholecalciferol (VITAMIN D) 1000 UNITS tablet Take 1,000 Units by mouth every evening.     Continuous Glucose Sensor (FREESTYLE LIBRE 3 SENSOR) MISC Place 1 sensor on the skin every 14 days. Use to check glucose continuously 6 each 3   dexamethasone (DECADRON) 1 MG tablet Take 1 tablet (1 mg total) by mouth daily with breakfast. 90 tablet 1   docusate sodium (COLACE) 100 MG capsule Take 100 mg by mouth daily.     folic acid (FOLVITE) 1 MG tablet Take 1 tablet by  mouth daily. 90 tablet 4   Krill Oil 300 MG CAPS Take 300 mg by mouth every evening.     levothyroxine (SYNTHROID) 88 MCG tablet Take 1 tablet (88 mcg total) by mouth daily before breakfast. 90 tablet 1   loratadine (CLARITIN) 10 MG tablet Take 10 mg by mouth every evening.      losartan (COZAAR) 50 MG tablet Take 1.5 tablets (75 mg total) by mouth daily. 135 tablet 1    mirtazapine (REMERON) 15 MG tablet Take 1 tablet (15 mg total) by mouth at bedtime. 90 tablet 1   Multiple Vitamins-Minerals (MULTIVITAMIN GUMMIES ADULT PO) Take 2 each by mouth daily.     pantoprazole (PROTONIX) 40 MG tablet Take 1 tablet (40 mg total) by mouth every evening. 90 tablet 1   PRESCRIPTION MEDICATION Testosterone injection     prochlorperazine (COMPAZINE) 10 MG tablet Take 1 tablet (10 mg total) by mouth every 6 (six) hours as needed for nausea or vomiting. 30 tablet 0   rosuvastatin (CRESTOR) 20 MG tablet Take 1 tablet (20 mg total) by mouth daily. 90 tablet 0   Semaglutide,0.25 or 0.5MG /DOS, (OZEMPIC, 0.25 OR 0.5 MG/DOSE,) 2 MG/3ML SOPN Inject 0.5 mg into the skin once a week. 9 mL 0   SYRINGE-NEEDLE, DISP, 3 ML 21G X 1-1/2" 3 ML MISC Use to inject testosterone every week 100 each 2   testosterone cypionate (DEPOTESTOTERONE CYPIONATE) 100 MG/ML injection Take 50mg  ( 0.107ml) and 100mg  ( 1ml) into muscle alternatively weekly. 10 mL 1   venlafaxine XR (EFFEXOR-XR) 75 MG 24 hr capsule Take 1 capsule (75 mg total) by mouth daily with breakfast. 90 capsule 0   No current facility-administered medications for this visit.    SURGICAL HISTORY:  Past Surgical History:  Procedure Laterality Date   Bilateral inguinal hernia repair     BRONCHIAL NEEDLE ASPIRATION BIOPSY  04/05/2021   Procedure: BRONCHIAL NEEDLE ASPIRATION BIOPSIES;  Surgeon: Josephine Igo, DO;  Location: [facility];  Service: Pulmonary;;   COLONOSCOPY  01/23/2012   Procedure: COLONOSCOPY;  Surgeon: Corbin Ade, MD;  Location: [facility];  Service: Endoscopy;  Laterality: N/A;  9:30 AM   COLONOSCOPY N/A 10/11/2015   Procedure: COLONOSCOPY;  Surgeon: Corbin Ade, MD;  Location: [facility];  Service: Endoscopy;  Laterality: N/A;  830   COLONOSCOPY N/A 10/14/2019   Procedure: COLONOSCOPY;  Surgeon: Corbin Ade, MD;  Location: [facility];  Service: Endoscopy;  Laterality: N/A;  1:45   POLYPECTOMY  10/14/2019    Procedure: POLYPECTOMY;  Surgeon: Corbin Ade, MD;  Location: [facility];  Service: Endoscopy;;  ascending colon, descending colon   VIDEO BRONCHOSCOPY WITH ENDOBRONCHIAL ULTRASOUND N/A 04/05/2021   Procedure: VIDEO BRONCHOSCOPY WITH ENDOBRONCHIAL ULTRASOUND;  Surgeon: Josephine Igo, DO;  Location: [facility];  Service: Pulmonary;  Laterality: N/A;    REVIEW OF SYSTEMS:   Review of Systems  Constitutional: Negative for appetite change, chills, fatigue, fever and unexpected weight change.  HENT: Poor dentition. Negative for mouth sores, nosebleeds, sore throat and trouble swallowing.   Eyes: Negative for eye problems and icterus.  Respiratory: Negative for cough, hemoptysis, shortness of breath and wheezing.   Cardiovascular: Negative for chest pain. Improving lower extremity swelling bilaterally.  Gastrointestinal: Negative for abdominal pain, constipation, diarrhea, nausea and vomiting.  Genitourinary: Negative for bladder incontinence, difficulty urinating, dysuria, frequency and hematuria.   Musculoskeletal: Negative for back pain, gait problem, neck pain and neck stiffness.  Skin: Negative for  itching and rash.  Neurological: Negative for dizziness, extremity weakness, gait problem, headaches, light-headedness and seizures.  Hematological: Negative for adenopathy. Does not bruise/bleed easily.  Psychiatric/Behavioral: Negative for confusion, depression and sleep disturbance. The patient is not nervous/anxious   PHYSICAL EXAMINATION:  There were no vitals taken for this visit.  ECOG PERFORMANCE STATUS: 1  Physical Exam  Constitutional: Oriented to person, place, and time and well-developed, well-nourished, and in no distress.  HENT:  Head: Normocephalic and atraumatic.  Mouth/Throat: Positive for poor dentition. Oropharynx is clear and moist. No oropharyngeal exudate.  Eyes: Conjunctivae are normal. Right eye exhibits no discharge. Left eye exhibits no discharge. No  scleral icterus.  Neck: Normal range of motion. Neck supple.  Cardiovascular: Normal rate, regular rhythm, normal heart sounds and intact distal pulses.   Pulmonary/Chest: Effort normal and breath sounds normal. No respiratory distress. No wheezes. No rales.  Abdominal: Soft. Bowel sounds are normal. Exhibits no distension and no mass. There is no tenderness.  Musculoskeletal: Normal range of motion. Improved edema.  Lymphadenopathy:    No cervical adenopathy.  Neurological: Alert and oriented to person, place, and time. Exhibits normal muscle tone. Gait normal. Coordination normal.  Skin: Skin is warm and dry. No rash noted. Not diaphoretic. No erythema. No pallor.  Psychiatric: Mood, memory and judgment normal.  Vitals reviewed.  LABORATORY DATA: Lab Results  Component Value Date   WBC 9.9 06/07/2023   HGB 12.1 (L) 06/07/2023   HCT 41.6 06/07/2023   MCV 86 06/07/2023   PLT 205 06/07/2023      Chemistry      Component Value Date/Time   NA 145 (H) 06/07/2023 0825   K 4.2 06/07/2023 0825   CL 103 06/07/2023 0825   CO2 26 06/07/2023 0825   BUN 19 06/07/2023 0825   CREATININE 1.24 06/07/2023 0825   CREATININE 1.01 05/28/2023 1048      Component Value Date/Time   CALCIUM 9.5 06/07/2023 0825   ALKPHOS 70 06/07/2023 0825   AST 26 06/07/2023 0825   AST 23 05/28/2023 1048   ALT 26 06/07/2023 0825   ALT 19 05/28/2023 1048   BILITOT 0.2 06/07/2023 0825   BILITOT 0.3 05/28/2023 1048       RADIOGRAPHIC STUDIES:  CT Chest W Contrast  Result Date: 05/28/2023 CLINICAL DATA:  Is non-small cell lung cancer staging. * Tracking Code: BO * EXAM: CT CHEST, ABDOMEN, AND PELVIS WITH CONTRAST TECHNIQUE: Multidetector CT imaging of the chest, abdomen and pelvis was performed following the standard protocol during bolus administration of intravenous contrast. RADIATION DOSE REDUCTION: This exam was performed according to the departmental dose-optimization program which includes automated  exposure control, adjustment of the mA and/or kV according to patient size and/or use of iterative reconstruction technique. CONTRAST:  OMNIPAQUE IOHEXOL 300 MG/ML  SOLN COMPARISON:  02/11/2023 FINDINGS: CT CHEST FINDINGS Cardiovascular: Small pericardial effusion. Mediastinum/Nodes: No axillary or supraclavicular adenopathy. No mediastinal or hilar adenopathy. Esophagus normal. Lungs/Pleura: Scattered small nodules unchanged. Example RIGHT upper lobe nodule measuring 7 mm on image 50/6 unchanged from 7 mm. RIGHT upper lobe ill-defined nodule measuring 5 mm on image 67 is unchanged. Posterior RIGHT upper lobe nodule measuring 5 mm image 45 is unchanged. No new pulmonary nodules. Musculoskeletal: No aggressive osseous lesion. CT ABDOMEN AND PELVIS FINDINGS Hepatobiliary: Hypodense lesion in the inferior RIGHT hepatic lobe measuring 7 mm unchanged. Fat density lesion in the dome liver unchanged. No new hepatic lesions. Pancreas: Pancreas is normal. No ductal dilatation. No pancreatic inflammation.  Spleen: Normal spleen Adrenals/urinary tract: Adrenal glands and kidneys are normal. Nonobstructing LEFT renal calculus again noted. The ureters and bladder normal. Stomach/Bowel: Stomach, small bowel, appendix, and cecum are normal. The colon and rectosigmoid colon are normal. Vascular/Lymphatic: Abdominal aorta is normal caliber with atherosclerotic calcification. There is no retroperitoneal or periportal lymphadenopathy. No pelvic lymphadenopathy. Reproductive: Prostate normal Other: No free fluid. Musculoskeletal: No aggressive osseous lesion. IMPRESSION: CHEST: No evidence of thoracic metastatic disease. Stable small RIGHT lung pulmonary nodules. PELVIS: No evidence of metastatic disease in the abdomen pelvis. Electronically Signed   By: Genevive Bi M.D.   On: 05/28/2023 10:44   CT ABDOMEN PELVIS W CONTRAST  Result Date: 05/28/2023 CLINICAL DATA:  Is non-small cell lung cancer staging. * Tracking Code: BO  * EXAM: CT CHEST, ABDOMEN, AND PELVIS WITH CONTRAST TECHNIQUE: Multidetector CT imaging of the chest, abdomen and pelvis was performed following the standard protocol during bolus administration of intravenous contrast. RADIATION DOSE REDUCTION: This exam was performed according to the departmental dose-optimization program which includes automated exposure control, adjustment of the mA and/or kV according to patient size and/or use of iterative reconstruction technique. CONTRAST:  OMNIPAQUE IOHEXOL 300 MG/ML  SOLN COMPARISON:  02/11/2023 FINDINGS: CT CHEST FINDINGS Cardiovascular: Small pericardial effusion. Mediastinum/Nodes: No axillary or supraclavicular adenopathy. No mediastinal or hilar adenopathy. Esophagus normal. Lungs/Pleura: Scattered small nodules unchanged. Example RIGHT upper lobe nodule measuring 7 mm on image 50/6 unchanged from 7 mm. RIGHT upper lobe ill-defined nodule measuring 5 mm on image 67 is unchanged. Posterior RIGHT upper lobe nodule measuring 5 mm image 45 is unchanged. No new pulmonary nodules. Musculoskeletal: No aggressive osseous lesion. CT ABDOMEN AND PELVIS FINDINGS Hepatobiliary: Hypodense lesion in the inferior RIGHT hepatic lobe measuring 7 mm unchanged. Fat density lesion in the dome liver unchanged. No new hepatic lesions. Pancreas: Pancreas is normal. No ductal dilatation. No pancreatic inflammation. Spleen: Normal spleen Adrenals/urinary tract: Adrenal glands and kidneys are normal. Nonobstructing LEFT renal calculus again noted. The ureters and bladder normal. Stomach/Bowel: Stomach, small bowel, appendix, and cecum are normal. The colon and rectosigmoid colon are normal. Vascular/Lymphatic: Abdominal aorta is normal caliber with atherosclerotic calcification. There is no retroperitoneal or periportal lymphadenopathy. No pelvic lymphadenopathy. Reproductive: Prostate normal Other: No free fluid. Musculoskeletal: No aggressive osseous lesion. IMPRESSION: CHEST: No  evidence of thoracic metastatic disease. Stable small RIGHT lung pulmonary nodules. PELVIS: No evidence of metastatic disease in the abdomen pelvis. Electronically Signed   By: Genevive Bi M.D.   On: 05/28/2023 10:44     ASSESSMENT/PLAN:  This is a very pleasant 67 year old Caucasian male diagnosed with stage IV (T1b, N3, M1 C) non-small cell lung cancer, favoring adenocarcinoma.  The patient presented with a right upper lobe lung nodule in addition to right hilar, subcarinal, and bilateral mediastinal lymphadenopathy as well as supraclavicular lymphadenopathy.  The patient also has metastatic disease to the brain.  He was diagnosed in June 2022.  His PD-L1 expression is 80%.  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   The patient completed SRS to the brain lesions under the care of Dr. Kathrynn Running.  His last treatment was on 05/04/2021. 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    The patient is currently undergoing systemic chemotherapy with carboplatin for an AUC of 5, Alimta 500 mg per metered squared, Keytruda 200 mg IV every 3 weeks.  Status post 36 cycles. Starting from cycle #5, the patient started maintenance Alimta and Keytruda IV every 3 weeks.    Labs were reviewed.  Recommend that he proceed with cycle #37 today as scheduled.  We will see him back for follow-up visit in 3 weeks for evaluation repeat blood work before undergoing cycle #38.  I let the patient know that dental cleanings are okay, although I would recommend having a dental cleaning in between his chemotherapy cycle.  If he does need some more major dental work, would recommend waiting approximately 2 weeks after chemotherapy and if needed, we can delay his next cycle of treatment by an additional week as well.   The patient was advised to call immediately if he has any concerning  symptoms in the interval. The patient voices understanding of current disease status and treatment options and is in agreement with the current care plan. All questions were answered. The patient knows to call the clinic with any problems, questions or concerns. We can certainly see the patient much sooner if necessary   No orders of the defined types were placed in this encounter.   The total time spent in the appointment was 20-29 minutes   Idalis Hoelting L Emmanuelle Coxe, PA-C 06/14/23

## 2023-06-18 ENCOUNTER — Inpatient Hospital Stay: Payer: Commercial Managed Care - PPO | Attending: Physician Assistant

## 2023-06-18 ENCOUNTER — Inpatient Hospital Stay: Payer: Commercial Managed Care - PPO | Admitting: Physician Assistant

## 2023-06-18 ENCOUNTER — Inpatient Hospital Stay: Payer: Commercial Managed Care - PPO

## 2023-06-18 VITALS — BP 145/97 | HR 95 | Temp 97.9°F | Resp 18

## 2023-06-18 VITALS — BP 130/91 | HR 98 | Temp 98.1°F | Resp 17 | Wt 187.3 lb

## 2023-06-18 DIAGNOSIS — F17210 Nicotine dependence, cigarettes, uncomplicated: Secondary | ICD-10-CM | POA: Diagnosis not present

## 2023-06-18 DIAGNOSIS — C3491 Malignant neoplasm of unspecified part of right bronchus or lung: Secondary | ICD-10-CM | POA: Diagnosis not present

## 2023-06-18 DIAGNOSIS — C7931 Secondary malignant neoplasm of brain: Secondary | ICD-10-CM | POA: Diagnosis not present

## 2023-06-18 DIAGNOSIS — Z79620 Long term (current) use of immunosuppressive biologic: Secondary | ICD-10-CM | POA: Diagnosis not present

## 2023-06-18 DIAGNOSIS — Z5112 Encounter for antineoplastic immunotherapy: Secondary | ICD-10-CM | POA: Diagnosis not present

## 2023-06-18 DIAGNOSIS — Z5111 Encounter for antineoplastic chemotherapy: Secondary | ICD-10-CM | POA: Diagnosis not present

## 2023-06-18 DIAGNOSIS — C3411 Malignant neoplasm of upper lobe, right bronchus or lung: Secondary | ICD-10-CM | POA: Diagnosis not present

## 2023-06-18 DIAGNOSIS — E230 Hypopituitarism: Secondary | ICD-10-CM | POA: Insufficient documentation

## 2023-06-18 DIAGNOSIS — C7951 Secondary malignant neoplasm of bone: Secondary | ICD-10-CM | POA: Diagnosis not present

## 2023-06-18 DIAGNOSIS — Z79899 Other long term (current) drug therapy: Secondary | ICD-10-CM | POA: Diagnosis not present

## 2023-06-18 LAB — CMP (CANCER CENTER ONLY)
ALT: 21 U/L (ref 0–44)
AST: 19 U/L (ref 15–41)
Albumin: 4.1 g/dL (ref 3.5–5.0)
Alkaline Phosphatase: 59 U/L (ref 38–126)
Anion gap: 8 (ref 5–15)
BUN: 15 mg/dL (ref 8–23)
CO2: 30 mmol/L (ref 22–32)
Calcium: 9.4 mg/dL (ref 8.9–10.3)
Chloride: 104 mmol/L (ref 98–111)
Creatinine: 1.06 mg/dL (ref 0.61–1.24)
GFR, Estimated: >60 mL/min (ref >60)
Glucose, Bld: 107 mg/dL — ABNORMAL HIGH (ref 70–99)
Potassium: 4 mmol/L (ref 3.5–5.1)
Sodium: 142 mmol/L (ref 135–145)
Total Bilirubin: 0.3 mg/dL (ref 0.3–1.2)
Total Protein: 7.2 g/dL (ref 6.5–8.1)

## 2023-06-18 LAB — CBC WITH DIFFERENTIAL (CANCER CENTER ONLY)
Abs Immature Granulocytes: 0.07 10*3/uL (ref 0.00–0.07)
Basophils Absolute: 0.1 10*3/uL (ref 0.0–0.1)
Basophils Relative: 1 %
Eosinophils Absolute: 0.1 10*3/uL (ref 0.0–0.5)
Eosinophils Relative: 0 %
HCT: 43.8 % (ref 39.0–52.0)
Hemoglobin: 12.9 g/dL — ABNORMAL LOW (ref 13.0–17.0)
Immature Granulocytes: 1 %
Lymphocytes Relative: 12 %
Lymphs Abs: 1.3 10*3/uL (ref 0.7–4.0)
MCH: 25.3 pg — ABNORMAL LOW (ref 26.0–34.0)
MCHC: 29.5 g/dL — ABNORMAL LOW (ref 30.0–36.0)
MCV: 85.9 fL (ref 80.0–100.0)
Monocytes Absolute: 0.7 10*3/uL (ref 0.1–1.0)
Monocytes Relative: 6 %
Neutro Abs: 9.1 10*3/uL — ABNORMAL HIGH (ref 1.7–7.7)
Neutrophils Relative %: 80 %
Platelet Count: 319 10*3/uL (ref 150–400)
RBC: 5.1 MIL/uL (ref 4.22–5.81)
RDW: 19.5 % — ABNORMAL HIGH (ref 11.5–15.5)
WBC Count: 11.3 10*3/uL — ABNORMAL HIGH (ref 4.0–10.5)
nRBC: 0 % (ref 0.0–0.2)

## 2023-06-18 MED ORDER — SODIUM CHLORIDE 0.9 % IV SOLN
500.0000 mg/m2 | Freq: Once | INTRAVENOUS | Status: AC
  Administered 2023-06-18: 1000 mg via INTRAVENOUS
  Filled 2023-06-18: qty 40

## 2023-06-18 MED ORDER — SODIUM CHLORIDE 0.9 % IV SOLN
200.0000 mg | Freq: Once | INTRAVENOUS | Status: AC
  Administered 2023-06-18: 200 mg via INTRAVENOUS
  Filled 2023-06-18: qty 200

## 2023-06-18 MED ORDER — SODIUM CHLORIDE 0.9 % IV SOLN: Freq: Once | INTRAVENOUS | Status: AC

## 2023-06-18 MED ORDER — PROCHLORPERAZINE MALEATE 10 MG PO TABS
10.0000 mg | ORAL_TABLET | Freq: Once | ORAL | Status: AC
  Administered 2023-06-18: 10 mg via ORAL
  Filled 2023-06-18: qty 1

## 2023-06-18 NOTE — Patient Instructions (Signed)
[facility]  Discharge Instructions: Thank you for choosing [facility] to provide your oncology and hematology care.   If you have a lab appointment with the [facility], please go directly to the [facility] and check in at the registration area.   Wear comfortable clothing and clothing appropriate for easy access to any Portacath or PICC line.   We strive to give you quality time with your provider. You may need to reschedule your appointment if you arrive late (15 or more minutes).  Arriving late affects you and other patients whose appointments are after yours.  Also, if you miss three or more appointments without notifying the office, you may be dismissed from the clinic at the provider's discretion.      For prescription refill requests, have your pharmacy contact our office and allow 72 hours for refills to be completed.    Today you received the following chemotherapy and/or immunotherapy agents: Keytruda, Alimta.       To help prevent nausea and vomiting after your treatment, we encourage you to take your nausea medication as directed.  BELOW ARE SYMPTOMS THAT SHOULD BE REPORTED IMMEDIATELY: *FEVER GREATER THAN 100.4 F (38 C) OR HIGHER *CHILLS OR SWEATING *NAUSEA AND VOMITING THAT IS NOT CONTROLLED WITH YOUR NAUSEA MEDICATION *UNUSUAL SHORTNESS OF BREATH *UNUSUAL BRUISING OR BLEEDING *URINARY PROBLEMS (pain or burning when urinating, or frequent urination) *BOWEL PROBLEMS (unusual diarrhea, constipation, pain near the anus) TENDERNESS IN MOUTH AND THROAT WITH OR WITHOUT PRESENCE OF ULCERS (sore throat, sores in mouth, or a toothache) UNUSUAL RASH, SWELLING OR PAIN  UNUSUAL VAGINAL DISCHARGE OR ITCHING   Items with * indicate a potential emergency and should be followed up as soon as possible or go to the Emergency Department if any problems should occur.  Please show the CHEMOTHERAPY ALERT CARD or IMMUNOTHERAPY ALERT CARD  at check-in to the Emergency Department and triage nurse.  Should you have questions after your visit or need to cancel or reschedule your appointment, please contact [facility]  Dept: [phone]  and follow the prompts.  Office hours are 8:00 a.m. to 4:30 p.m. Monday - Friday. Please note that voicemails left after 4:00 p.m. may not be returned until the following business day.  We are closed weekends and major holidays. You have access to a nurse at all times for urgent questions. Please call the main number to the clinic Dept: [phone] and follow the prompts.   For any non-urgent questions, you may also contact your provider using MyChart. We now offer e-Visits for anyone 34 and older to request care online for non-urgent symptoms. For details visit [email or web address].   Also download the MyChart app! Go to the app store, search "MyChart", open the app, select [facility], and log in with your MyChart username and password.

## 2023-06-19 LAB — T4: T4, Total: 8.6 ug/dL (ref 4.5–12.0)

## 2023-06-26 ENCOUNTER — Other Ambulatory Visit: Payer: Self-pay

## 2023-06-26 ENCOUNTER — Other Ambulatory Visit (HOSPITAL_COMMUNITY): Payer: Self-pay

## 2023-07-09 ENCOUNTER — Inpatient Hospital Stay: Payer: Commercial Managed Care - PPO | Attending: Physician Assistant

## 2023-07-09 ENCOUNTER — Inpatient Hospital Stay (HOSPITAL_BASED_OUTPATIENT_CLINIC_OR_DEPARTMENT_OTHER): Payer: Commercial Managed Care - PPO | Admitting: Internal Medicine

## 2023-07-09 ENCOUNTER — Inpatient Hospital Stay: Payer: Commercial Managed Care - PPO

## 2023-07-09 DIAGNOSIS — Z79899 Other long term (current) drug therapy: Secondary | ICD-10-CM | POA: Insufficient documentation

## 2023-07-09 DIAGNOSIS — F17210 Nicotine dependence, cigarettes, uncomplicated: Secondary | ICD-10-CM | POA: Insufficient documentation

## 2023-07-09 DIAGNOSIS — C3411 Malignant neoplasm of upper lobe, right bronchus or lung: Secondary | ICD-10-CM | POA: Insufficient documentation

## 2023-07-09 DIAGNOSIS — C7951 Secondary malignant neoplasm of bone: Secondary | ICD-10-CM | POA: Insufficient documentation

## 2023-07-09 DIAGNOSIS — Z5111 Encounter for antineoplastic chemotherapy: Secondary | ICD-10-CM | POA: Insufficient documentation

## 2023-07-09 DIAGNOSIS — C3491 Malignant neoplasm of unspecified part of right bronchus or lung: Secondary | ICD-10-CM | POA: Diagnosis not present

## 2023-07-09 DIAGNOSIS — Z5112 Encounter for antineoplastic immunotherapy: Secondary | ICD-10-CM | POA: Diagnosis not present

## 2023-07-09 DIAGNOSIS — C7931 Secondary malignant neoplasm of brain: Secondary | ICD-10-CM | POA: Diagnosis not present

## 2023-07-09 DIAGNOSIS — E538 Deficiency of other specified B group vitamins: Secondary | ICD-10-CM | POA: Insufficient documentation

## 2023-07-09 DIAGNOSIS — E230 Hypopituitarism: Secondary | ICD-10-CM | POA: Diagnosis not present

## 2023-07-09 LAB — CBC WITH DIFFERENTIAL (CANCER CENTER ONLY)
Abs Immature Granulocytes: 0.04 10*3/uL (ref 0.00–0.07)
Basophils Absolute: 0.1 10*3/uL (ref 0.0–0.1)
Basophils Relative: 1 %
Eosinophils Absolute: 0.1 10*3/uL (ref 0.0–0.5)
Eosinophils Relative: 1 %
HCT: 39.7 % (ref 39.0–52.0)
Hemoglobin: 11.7 g/dL — ABNORMAL LOW (ref 13.0–17.0)
Immature Granulocytes: 0 %
Lymphocytes Relative: 16 %
Lymphs Abs: 1.8 10*3/uL (ref 0.7–4.0)
MCH: 25.4 pg — ABNORMAL LOW (ref 26.0–34.0)
MCHC: 29.5 g/dL — ABNORMAL LOW (ref 30.0–36.0)
MCV: 86.3 fL (ref 80.0–100.0)
Monocytes Absolute: 0.7 10*3/uL (ref 0.1–1.0)
Monocytes Relative: 7 %
Neutro Abs: 8.2 10*3/uL — ABNORMAL HIGH (ref 1.7–7.7)
Neutrophils Relative %: 75 %
Platelet Count: 338 10*3/uL (ref 150–400)
RBC: 4.6 MIL/uL (ref 4.22–5.81)
RDW: 19.9 % — ABNORMAL HIGH (ref 11.5–15.5)
WBC Count: 10.8 10*3/uL — ABNORMAL HIGH (ref 4.0–10.5)
nRBC: 0 % (ref 0.0–0.2)

## 2023-07-09 LAB — CMP (CANCER CENTER ONLY)
ALT: 13 U/L (ref 0–44)
AST: 15 U/L (ref 15–41)
Albumin: 3.8 g/dL (ref 3.5–5.0)
Alkaline Phosphatase: 57 U/L (ref 38–126)
Anion gap: 7 (ref 5–15)
BUN: 13 mg/dL (ref 8–23)
CO2: 28 mmol/L (ref 22–32)
Calcium: 9.2 mg/dL (ref 8.9–10.3)
Chloride: 109 mmol/L (ref 98–111)
Creatinine: 0.99 mg/dL (ref 0.61–1.24)
GFR, Estimated: >60 mL/min (ref >60)
Glucose, Bld: 95 mg/dL (ref 70–99)
Potassium: 4 mmol/L (ref 3.5–5.1)
Sodium: 144 mmol/L (ref 135–145)
Total Bilirubin: 0.2 mg/dL — ABNORMAL LOW (ref 0.3–1.2)
Total Protein: 6.5 g/dL (ref 6.5–8.1)

## 2023-07-09 MED ORDER — PROCHLORPERAZINE MALEATE 10 MG PO TABS
10.0000 mg | ORAL_TABLET | Freq: Once | ORAL | Status: AC
  Administered 2023-07-09: 10 mg via ORAL
  Filled 2023-07-09: qty 1

## 2023-07-09 MED ORDER — SODIUM CHLORIDE 0.9 % IV SOLN
500.0000 mg/m2 | Freq: Once | INTRAVENOUS | Status: AC
  Administered 2023-07-09: 1000 mg via INTRAVENOUS
  Filled 2023-07-09: qty 40

## 2023-07-09 MED ORDER — SODIUM CHLORIDE 0.9 % IV SOLN
200.0000 mg | Freq: Once | INTRAVENOUS | Status: AC
  Administered 2023-07-09: 200 mg via INTRAVENOUS
  Filled 2023-07-09: qty 200

## 2023-07-09 MED ORDER — SODIUM CHLORIDE 0.9 % IV SOLN: Freq: Once | INTRAVENOUS | Status: AC

## 2023-07-09 NOTE — Patient Instructions (Signed)
[facility]  Discharge Instructions: Thank you for choosing [facility] to provide your oncology and hematology care.   If you have a lab appointment with the [facility], please go directly to the [facility] and check in at the registration area.   Wear comfortable clothing and clothing appropriate for easy access to any Portacath or PICC line.   We strive to give you quality time with your provider. You may need to reschedule your appointment if you arrive late (15 or more minutes).  Arriving late affects you and other patients whose appointments are after yours.  Also, if you miss three or more appointments without notifying the office, you may be dismissed from the clinic at the provider's discretion.      For prescription refill requests, have your pharmacy contact our office and allow 72 hours for refills to be completed.    Today you received the following chemotherapy and/or immunotherapy agents keytruda , alimta      To help prevent nausea and vomiting after your treatment, we encourage you to take your nausea medication as directed.  BELOW ARE SYMPTOMS THAT SHOULD BE REPORTED IMMEDIATELY: *FEVER GREATER THAN 100.4 F (38 C) OR HIGHER *CHILLS OR SWEATING *NAUSEA AND VOMITING THAT IS NOT CONTROLLED WITH YOUR NAUSEA MEDICATION *UNUSUAL SHORTNESS OF BREATH *UNUSUAL BRUISING OR BLEEDING *URINARY PROBLEMS (pain or burning when urinating, or frequent urination) *BOWEL PROBLEMS (unusual diarrhea, constipation, pain near the anus) TENDERNESS IN MOUTH AND THROAT WITH OR WITHOUT PRESENCE OF ULCERS (sore throat, sores in mouth, or a toothache) UNUSUAL RASH, SWELLING OR PAIN  UNUSUAL VAGINAL DISCHARGE OR ITCHING   Items with * indicate a potential emergency and should be followed up as soon as possible or go to the Emergency Department if any problems should occur.  Please show the CHEMOTHERAPY ALERT CARD or IMMUNOTHERAPY ALERT CARD  at check-in to the Emergency Department and triage nurse.  Should you have questions after your visit or need to cancel or reschedule your appointment, please contact [facility]  Dept: [phone]  and follow the prompts.  Office hours are 8:00 a.m. to 4:30 p.m. Monday - Friday. Please note that voicemails left after 4:00 p.m. may not be returned until the following business day.  We are closed weekends and major holidays. You have access to a nurse at all times for urgent questions. Please call the main number to the clinic Dept: [phone] and follow the prompts.   For any non-urgent questions, you may also contact your provider using MyChart. We now offer e-Visits for anyone 33 and older to request care online for non-urgent symptoms. For details visit [email or web address].   Also download the MyChart app! Go to the app store, search "MyChart", open the app, select [facility], and log in with your MyChart username and password.

## 2023-07-09 NOTE — Progress Notes (Signed)
[facility] Telephone:(336) [phone]   Fax:(336) [phone]  OFFICE PROGRESS NOTE  Bradley Masters, FNP [address] [place] [address]  DIAGNOSIS: Stage IV (T1b, N3, M1C) non-small cell lung cancer, favoring adenocarcinoma presented with right upper lobe lung nodule in addition to right hilar, subcarinal and bilateral mediastinal as well as supraclavicular lymphadenopathy in addition to bone and brain metastasis diagnosed in June 2022.     PD-L1 expression 80%.     Molecular Studies:  Biomarker Findings Microsatellite status - MS-Stable Tumor Mutational Burden - 6 Muts/Mb Genomic Findings For a complete list of the genes assayed, please refer to the Appendix. KRAS G12C, amplification ATM S470* CCND1 amplification - equivocal? HGF amplification - equivocal? MYC amplification - equivocal? FGF19 amplification - equivocal? FGF3 amplification - equivocal? FGF4 amplification - equivocal? NFKBIA amplification NKX2-1 amplification RAD21 amplification - equivocal? [id number]*26 TERT promoter -124C>T TP53 rearrangement exon 9 7 Disease relevant genes with no reportable alterations: ALK, BRAF, EGFR, ERBB2, MET, RET, ROS1   PRIOR THERAPY: SRS to the metastatic brain lesions under the care of Dr. Kathrynn Running.  Last treatment on 05/04/2021.   CURRENT THERAPY: Palliative systemic chemotherapy with carboplatin for an AUC 5, Alimta 500 mg/m2 and, Keytruda 200 mg IV every 3 weeks.  First dose expected on 05/08/2021.  Status post 37 cycles.  Starting from cycle #5 he is on maintenance treatment with Alimta and Keytruda every 3 weeks.  INTERVAL HISTORY: Bradley Goodman 67 y.o. male returns to the clinic today for follow-up visit accompanied by his wife.Discussed the use of AI scribe software for clinical note transcription with the patient, who gave verbal consent to proceed.  History of Present Illness   The patient, a 67 year old white male diagnosed with stage  four non-small cell lung cancer (adenocarcinoma) in June 2022, has been undergoing treatment since diagnosis. He underwent stereotactic radiosurgery [facility]) for brain metastases and started on a chemotherapy regimen of Carboplatin, Alimta, and Keytruda. After four cycles, the treatment was continued with Alimta and Keytruda. At the time of the consultation, he had completed 37 cycles of this regimen.  In the past three weeks, the patient has not experienced any new or worsening symptoms. He denies any nausea, vomiting, diarrhea, constipation, chest pain, breathing issues, hemoptysis, or unintentional weight loss. He reports a history of fluid retention leading to leg swelling, which has significantly improved. He is not currently taking Lasix for this issue.  The patient also mentions the need to manage his diabetes with Ozempic, indicating a comorbid condition. However, he does not report any specific symptoms or complications related to his diabetes at this time.       MEDICAL HISTORY: Past Medical History:  Diagnosis Date   Cervical spondylolysis    Essential hypertension    GERD (gastroesophageal reflux disease)    History of kidney stones    History of migraine    Hyperlipidemia    Hypertension    PONV (postoperative nausea and vomiting)    Type 2 diabetes mellitus (HCC)     ALLERGIES:  has No Known Allergies.  MEDICATIONS:  Current Outpatient Medications  Medication Sig Dispense Refill   cholecalciferol (VITAMIN D) 1000 UNITS tablet Take 1,000 Units by mouth every evening.     Continuous Glucose Sensor (FREESTYLE LIBRE 3 SENSOR) MISC Place 1 sensor on the skin every 14 days. Use to check glucose continuously 6 each 3   dexamethasone (DECADRON) 1 MG tablet Take 1 tablet (1 mg total)  by mouth daily with breakfast. 90 tablet 1   docusate sodium (COLACE) 100 MG capsule Take 100 mg by mouth daily.     folic acid (FOLVITE) 1 MG tablet Take 1 tablet by mouth daily. 90 tablet 4   Krill  Oil 300 MG CAPS Take 300 mg by mouth every evening.     levothyroxine (SYNTHROID) 88 MCG tablet Take 1 tablet (88 mcg total) by mouth daily before breakfast. 90 tablet 1   loratadine (CLARITIN) 10 MG tablet Take 10 mg by mouth every evening.      losartan (COZAAR) 50 MG tablet Take 1.5 tablets (75 mg total) by mouth daily. 135 tablet 1   mirtazapine (REMERON) 15 MG tablet Take 1 tablet (15 mg total) by mouth at bedtime. 90 tablet 1   Multiple Vitamins-Minerals (MULTIVITAMIN GUMMIES ADULT PO) Take 2 each by mouth daily.     pantoprazole (PROTONIX) 40 MG tablet Take 1 tablet (40 mg total) by mouth every evening. 90 tablet 1   PRESCRIPTION MEDICATION Testosterone injection     prochlorperazine (COMPAZINE) 10 MG tablet Take 1 tablet (10 mg total) by mouth every 6 (six) hours as needed for nausea or vomiting. 30 tablet 0   rosuvastatin (CRESTOR) 20 MG tablet Take 1 tablet (20 mg total) by mouth daily. 90 tablet 0   Semaglutide,0.25 or 0.5MG /DOS, (OZEMPIC, 0.25 OR 0.5 MG/DOSE,) 2 MG/3ML SOPN Inject 0.5 mg into the skin once a week. 9 mL 0   SYRINGE-NEEDLE, DISP, 3 ML 21G X 1-1/2" 3 ML MISC Use to inject testosterone every week 100 each 2   testosterone cypionate (DEPOTESTOTERONE CYPIONATE) 100 MG/ML injection Take 50mg  ( 0.58ml) and 100mg  ( 1ml) into muscle alternatively weekly. 10 mL 1   venlafaxine XR (EFFEXOR-XR) 75 MG 24 hr capsule Take 1 capsule (75 mg total) by mouth daily with breakfast. 90 capsule 0   No current facility-administered medications for this visit.    SURGICAL HISTORY:  Past Surgical History:  Procedure Laterality Date   Bilateral inguinal hernia repair     BRONCHIAL NEEDLE ASPIRATION BIOPSY  04/05/2021   Procedure: BRONCHIAL NEEDLE ASPIRATION BIOPSIES;  Surgeon: Josephine Igo, DO;  [facility]: [facility];  Service: Pulmonary;;   COLONOSCOPY  01/23/2012   Procedure: COLONOSCOPY;  Surgeon: Corbin Ade, MD;  [facility];  Service: Endoscopy;  Laterality: N/A;   9:30 AM   COLONOSCOPY N/A 10/11/2015   Procedure: COLONOSCOPY;  Surgeon: Corbin Ade, MD;  [facility]: [facility];  Service: Endoscopy;  Laterality: N/A;  830   COLONOSCOPY N/A 10/14/2019   Procedure: COLONOSCOPY;  Surgeon: Corbin Ade, MD;  [facility]: [facility];  Service: Endoscopy;  Laterality: N/A;  1:45   POLYPECTOMY  10/14/2019   Procedure: POLYPECTOMY;  Surgeon: Corbin Ade, MD;  [facility];  Service: Endoscopy;;  ascending colon, descending colon   VIDEO BRONCHOSCOPY WITH ENDOBRONCHIAL ULTRASOUND N/A 04/05/2021   Procedure: VIDEO BRONCHOSCOPY WITH ENDOBRONCHIAL ULTRASOUND;  Surgeon: Josephine Igo, DO;  Location: [facility];  Service: Pulmonary;  Laterality: N/A;    REVIEW OF SYSTEMS:  A comprehensive review of systems was negative.   PHYSICAL EXAMINATION: General appearance: alert, cooperative, appears stated age, and no distress Head: Normocephalic, without obvious abnormality, atraumatic Neck: no adenopathy, no JVD, supple, symmetrical, trachea midline, and thyroid not enlarged, symmetric, no tenderness/mass/nodules Lymph nodes: Cervical, supraclavicular, and axillary nodes normal. Resp: clear to auscultation bilaterally Back: symmetric, no curvature. ROM normal. No CVA tenderness. Cardio: regular rate  and rhythm, S1, S2 normal, no murmur, click, rub or gallop GI: soft, non-tender; bowel sounds normal; no masses,  no organomegaly Extremities: extremities normal, atraumatic, no cyanosis or edema  ECOG PERFORMANCE STATUS: 1 - Symptomatic but completely ambulatory  Blood pressure 139/82, pulse 94, temperature 98.4 F (36.9 C), temperature source Oral, resp. rate 18, weight 185 lb 0.5 oz (83.9 kg), SpO2 98%.  LABORATORY DATA: Lab Results  Component Value Date   WBC 10.8 (H) 07/09/2023   HGB 11.7 (L) 07/09/2023   HCT 39.7 07/09/2023   MCV 86.3 07/09/2023   PLT 338 07/09/2023      Chemistry      Component Value Date/Time   NA 144 07/09/2023 0849    NA 145 (H) 06/07/2023 0825   K 4.0 07/09/2023 0849   CL 109 07/09/2023 0849   CO2 28 07/09/2023 0849   BUN 13 07/09/2023 0849   BUN 19 06/07/2023 0825   CREATININE 0.99 07/09/2023 0849      Component Value Date/Time   CALCIUM 9.2 07/09/2023 0849   ALKPHOS 57 07/09/2023 0849   AST 15 07/09/2023 0849   ALT 13 07/09/2023 0849   BILITOT 0.2 (L) 07/09/2023 0849       RADIOGRAPHIC STUDIES: No results found.  ASSESSMENT AND PLAN: This is a very pleasant 67 years old white male recently diagnosed with stage IV (T1b, N3, M1 C) non-small cell lung cancer favoring adenocarcinoma presented with right upper lobe lung nodule in addition to right hilar, subcarinal and bilateral mediastinal as well as supraclavicular lymphadenopathy.  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  His PD-L1 expression is 80% and his molecular studies showed KRAS G12C mutation. The patient underwent SRS to metastatic brain lesion under the care of Dr. Kathrynn Running and he is currently undergoing systemic chemotherapy with carboplatin for AUC of 5, Alimta 500 Mg/M2 and Keytruda 200 Mg IV every 3 weeks status post 37 cycles.  Starting from cycle #5 the patient will be treated with maintenance treatment with Alimta and Keytruda every 3 weeks.  He has been tolerating this treatment fairly well. Assessment and Plan    Stage IV Non-Small Cell Lung Cancer (Adenocarcinoma) Diagnosed in June 2022 with brain metastasis treated with SRS. Currently on cycle 38 of maintenance Alimta and Keytruda after initial 4 cycles of Carboplatin, Alimta, and Keytruda. No new symptoms or side effects reported. -Continue current regimen of Alimta and Keytruda. -Plan for imaging after the next cycle (cycle  39) to assess response to treatment.  Edema Improved, no current use of Lasix. -Monitor for recurrence.  Diabetes Mention of Ozempic, no further discussion of control or complications. -Continue current  management.  Follow-up in 3 weeks.   The patient was advised to call immediately if he has any concerning symptoms in the interval. The patient voices understanding of current disease status and treatment options and is in agreement with the current care plan.  All questions were answered. The patient knows to call the clinic with any problems, questions or concerns. We can certainly see the patient much sooner if necessary. The total time spent in the appointment was 20 minutes.  Disclaimer: This note was dictated with voice recognition software. Similar sounding words can inadvertently be transcribed and may not be corrected upon review.

## 2023-07-15 ENCOUNTER — Other Ambulatory Visit: Payer: Self-pay | Admitting: Family Medicine

## 2023-07-15 DIAGNOSIS — E785 Hyperlipidemia, unspecified: Secondary | ICD-10-CM

## 2023-07-15 DIAGNOSIS — E1165 Type 2 diabetes mellitus with hyperglycemia: Secondary | ICD-10-CM

## 2023-07-15 DIAGNOSIS — F3340 Major depressive disorder, recurrent, in remission, unspecified: Secondary | ICD-10-CM

## 2023-07-15 MED ORDER — ROSUVASTATIN CALCIUM 20 MG PO TABS
20.0000 mg | ORAL_TABLET | Freq: Every day | ORAL | 0 refills | Status: DC
  Filled 2023-07-15 – 2023-07-23 (×2): qty 90, 90d supply, fill #0

## 2023-07-15 MED ORDER — VENLAFAXINE HCL ER 75 MG PO CP24
75.0000 mg | ORAL_CAPSULE | Freq: Every day | ORAL | 0 refills | Status: DC
  Filled 2023-07-15 – 2023-07-23 (×2): qty 90, 90d supply, fill #0

## 2023-07-15 MED ORDER — OZEMPIC (0.25 OR 0.5 MG/DOSE) 2 MG/3ML ~~LOC~~ SOPN
0.5000 mg | PEN_INJECTOR | SUBCUTANEOUS | 0 refills | Status: DC
  Filled 2023-07-15 – 2023-08-09 (×2): qty 9, 84d supply, fill #0

## 2023-07-16 ENCOUNTER — Other Ambulatory Visit (HOSPITAL_COMMUNITY): Payer: Self-pay

## 2023-07-16 ENCOUNTER — Other Ambulatory Visit: Payer: Self-pay

## 2023-07-23 ENCOUNTER — Other Ambulatory Visit: Payer: Self-pay

## 2023-07-23 ENCOUNTER — Other Ambulatory Visit (HOSPITAL_COMMUNITY): Payer: Self-pay

## 2023-07-30 ENCOUNTER — Inpatient Hospital Stay: Payer: Commercial Managed Care - PPO

## 2023-07-30 ENCOUNTER — Inpatient Hospital Stay (HOSPITAL_BASED_OUTPATIENT_CLINIC_OR_DEPARTMENT_OTHER): Payer: Commercial Managed Care - PPO | Admitting: Internal Medicine

## 2023-07-30 VITALS — BP 143/83 | HR 94 | Temp 98.0°F | Resp 17 | Ht 71.0 in | Wt 183.2 lb

## 2023-07-30 DIAGNOSIS — E538 Deficiency of other specified B group vitamins: Secondary | ICD-10-CM | POA: Diagnosis not present

## 2023-07-30 DIAGNOSIS — F17210 Nicotine dependence, cigarettes, uncomplicated: Secondary | ICD-10-CM | POA: Diagnosis not present

## 2023-07-30 DIAGNOSIS — E230 Hypopituitarism: Secondary | ICD-10-CM | POA: Diagnosis not present

## 2023-07-30 DIAGNOSIS — C3411 Malignant neoplasm of upper lobe, right bronchus or lung: Secondary | ICD-10-CM | POA: Diagnosis not present

## 2023-07-30 DIAGNOSIS — C3490 Malignant neoplasm of unspecified part of unspecified bronchus or lung: Secondary | ICD-10-CM

## 2023-07-30 DIAGNOSIS — Z79899 Other long term (current) drug therapy: Secondary | ICD-10-CM | POA: Diagnosis not present

## 2023-07-30 DIAGNOSIS — C7951 Secondary malignant neoplasm of bone: Secondary | ICD-10-CM | POA: Diagnosis not present

## 2023-07-30 DIAGNOSIS — C7931 Secondary malignant neoplasm of brain: Secondary | ICD-10-CM | POA: Diagnosis not present

## 2023-07-30 DIAGNOSIS — Z5112 Encounter for antineoplastic immunotherapy: Secondary | ICD-10-CM | POA: Diagnosis not present

## 2023-07-30 DIAGNOSIS — C3491 Malignant neoplasm of unspecified part of right bronchus or lung: Secondary | ICD-10-CM | POA: Diagnosis not present

## 2023-07-30 DIAGNOSIS — Z5111 Encounter for antineoplastic chemotherapy: Secondary | ICD-10-CM | POA: Diagnosis not present

## 2023-07-30 LAB — CBC WITH DIFFERENTIAL (CANCER CENTER ONLY)
Abs Immature Granulocytes: 0.06 10*3/uL (ref 0.00–0.07)
Basophils Absolute: 0.1 10*3/uL (ref 0.0–0.1)
Basophils Relative: 1 %
Eosinophils Absolute: 0.2 10*3/uL (ref 0.0–0.5)
Eosinophils Relative: 2 %
HCT: 40 % (ref 39.0–52.0)
Hemoglobin: 12 g/dL — ABNORMAL LOW (ref 13.0–17.0)
Immature Granulocytes: 1 %
Lymphocytes Relative: 18 %
Lymphs Abs: 1.9 10*3/uL (ref 0.7–4.0)
MCH: 25.8 pg — ABNORMAL LOW (ref 26.0–34.0)
MCHC: 30 g/dL (ref 30.0–36.0)
MCV: 86 fL (ref 80.0–100.0)
Monocytes Absolute: 0.6 10*3/uL (ref 0.1–1.0)
Monocytes Relative: 6 %
Neutro Abs: 7.3 10*3/uL (ref 1.7–7.7)
Neutrophils Relative %: 72 %
Platelet Count: 337 10*3/uL (ref 150–400)
RBC: 4.65 MIL/uL (ref 4.22–5.81)
RDW: 20.1 % — ABNORMAL HIGH (ref 11.5–15.5)
WBC Count: 10.1 10*3/uL (ref 4.0–10.5)
nRBC: 0 % (ref 0.0–0.2)

## 2023-07-30 LAB — CMP (CANCER CENTER ONLY)
ALT: 16 U/L (ref 0–44)
AST: 19 U/L (ref 15–41)
Albumin: 3.7 g/dL (ref 3.5–5.0)
Alkaline Phosphatase: 61 U/L (ref 38–126)
Anion gap: 6 (ref 5–15)
BUN: 12 mg/dL (ref 8–23)
CO2: 27 mmol/L (ref 22–32)
Calcium: 9.1 mg/dL (ref 8.9–10.3)
Chloride: 108 mmol/L (ref 98–111)
Creatinine: 1.04 mg/dL (ref 0.61–1.24)
GFR, Estimated: >60 mL/min (ref >60)
Glucose, Bld: 100 mg/dL — ABNORMAL HIGH (ref 70–99)
Potassium: 4.1 mmol/L (ref 3.5–5.1)
Sodium: 141 mmol/L (ref 135–145)
Total Bilirubin: 0.3 mg/dL (ref 0.3–1.2)
Total Protein: 6.7 g/dL (ref 6.5–8.1)

## 2023-07-30 MED ORDER — CYANOCOBALAMIN 1000 MCG/ML IJ SOLN
1000.0000 ug | Freq: Once | INTRAMUSCULAR | Status: AC
  Administered 2023-07-30: 1000 ug via INTRAMUSCULAR
  Filled 2023-07-30: qty 1

## 2023-07-30 MED ORDER — PEMBROLIZUMAB CHEMO INJECTION 100 MG/4ML
200.0000 mg | Freq: Once | INTRAVENOUS | Status: AC
  Administered 2023-07-30: 200 mg via INTRAVENOUS
  Filled 2023-07-30: qty 200

## 2023-07-30 MED ORDER — PROCHLORPERAZINE MALEATE 10 MG PO TABS
10.0000 mg | ORAL_TABLET | Freq: Once | ORAL | Status: AC
  Administered 2023-07-30: 10 mg via ORAL
  Filled 2023-07-30: qty 1

## 2023-07-30 MED ORDER — SODIUM CHLORIDE 0.9 % IV SOLN: Freq: Once | INTRAVENOUS | Status: AC

## 2023-07-30 MED ORDER — SODIUM CHLORIDE 0.9 % IV SOLN
500.0000 mg/m2 | Freq: Once | INTRAVENOUS | Status: AC
  Administered 2023-07-30: 1000 mg via INTRAVENOUS
  Filled 2023-07-30: qty 40

## 2023-07-30 NOTE — Patient Instructions (Signed)
[facility]  Discharge Instructions: Thank you for choosing [facility] to provide your oncology and hematology care.   If you have a lab appointment with the [facility], please go directly to the [facility] and check in at the registration area.   Wear comfortable clothing and clothing appropriate for easy access to any Portacath or PICC line.   We strive to give you quality time with your provider. You may need to reschedule your appointment if you arrive late (15 or more minutes).  Arriving late affects you and other patients whose appointments are after yours.  Also, if you miss three or more appointments without notifying the office, you may be dismissed from the clinic at the provider's discretion.      For prescription refill requests, have your pharmacy contact our office and allow 72 hours for refills to be completed.    Today you received the following chemotherapy and/or immunotherapy agents keytruda , alimta      To help prevent nausea and vomiting after your treatment, we encourage you to take your nausea medication as directed.  BELOW ARE SYMPTOMS THAT SHOULD BE REPORTED IMMEDIATELY: *FEVER GREATER THAN 100.4 F (38 C) OR HIGHER *CHILLS OR SWEATING *NAUSEA AND VOMITING THAT IS NOT CONTROLLED WITH YOUR NAUSEA MEDICATION *UNUSUAL SHORTNESS OF BREATH *UNUSUAL BRUISING OR BLEEDING *URINARY PROBLEMS (pain or burning when urinating, or frequent urination) *BOWEL PROBLEMS (unusual diarrhea, constipation, pain near the anus) TENDERNESS IN MOUTH AND THROAT WITH OR WITHOUT PRESENCE OF ULCERS (sore throat, sores in mouth, or a toothache) UNUSUAL RASH, SWELLING OR PAIN  UNUSUAL VAGINAL DISCHARGE OR ITCHING   Items with * indicate a potential emergency and should be followed up as soon as possible or go to the Emergency Department if any problems should occur.  Please show the CHEMOTHERAPY ALERT CARD or IMMUNOTHERAPY ALERT CARD  at check-in to the Emergency Department and triage nurse.  Should you have questions after your visit or need to cancel or reschedule your appointment, please contact [facility]  Dept: [phone]  and follow the prompts.  Office hours are 8:00 a.m. to 4:30 p.m. Monday - Friday. Please note that voicemails left after 4:00 p.m. may not be returned until the following business day.  We are closed weekends and major holidays. You have access to a nurse at all times for urgent questions. Please call the main number to the clinic Dept: [phone] and follow the prompts.   For any non-urgent questions, you may also contact your provider using MyChart. We now offer e-Visits for anyone 33 and older to request care online for non-urgent symptoms. For details visit [email or web address].   Also download the MyChart app! Go to the app store, search "MyChart", open the app, select [facility], and log in with your MyChart username and password.

## 2023-07-30 NOTE — Progress Notes (Signed)
[facility] Telephone:([phone]   Fax:(336) [phone]  OFFICE PROGRESS NOTE  Bradley Masters, FNP [address] [place] [address]  DIAGNOSIS: Stage IV (T1b, N3, M1C) non-small cell lung cancer, favoring adenocarcinoma presented with right upper lobe lung nodule in addition to right hilar, subcarinal and bilateral mediastinal as well as supraclavicular lymphadenopathy in addition to bone and brain metastasis diagnosed in June 2022.     PD-L1 expression 80%.     Molecular Studies:  Biomarker Findings Microsatellite status - MS-Stable Tumor Mutational Burden - 6 Muts/Mb Genomic Findings For a complete list of the genes assayed, please refer to the Appendix. KRAS G12C, amplification ATM S470* CCND1 amplification - equivocal? HGF amplification - equivocal? MYC amplification - equivocal? FGF19 amplification - equivocal? FGF3 amplification - equivocal? FGF4 amplification - equivocal? NFKBIA amplification NKX2-1 amplification RAD21 amplification - equivocal? [id number]*26 TERT promoter -124C>T TP53 rearrangement exon 9 7 Disease relevant genes with no reportable alterations: ALK, BRAF, EGFR, ERBB2, MET, RET, ROS1   PRIOR THERAPY: SRS to the metastatic brain lesions under the care of Dr. Kathrynn Running.  Last treatment on 05/04/2021.   CURRENT THERAPY: Palliative systemic chemotherapy with carboplatin for an AUC 5, Alimta 500 mg/m2 and, Keytruda 200 mg IV every 3 weeks.  First dose expected on 05/08/2021.  Status post 37 cycles.  Starting from cycle #5 he is on maintenance treatment with Alimta and Keytruda every 3 weeks.  INTERVAL HISTORY: Bradley Goodman 67 y.o. male returns to the clinic today for follow-up visit accompanied by his wife. Discussed the use of AI scribe software for clinical note transcription with the patient, who gave verbal consent to proceed.  History of Present Illness   Bradley Goodman, a 67 year old individual diagnosed with stage four  non-small cell lung cancer adenocarcinoma in June 2022, presents for a follow-up. He underwent stereotactic radiosurgery [facility]) for brain metastasis under the care of Dr. Kathrynn Running. He was treated initially with 4 cycles of systemic chemotherapy with carboplatin, Alimta and Keytruda and starting from cycle #5 he has been on maintenance Alimta and Keytruda status post 38 cycles. He reports a single episode of feeling cold and unwell two weeks ago, but otherwise, he has been feeling fine. He denies experiencing chills during the episode of feeling cold. He denies any recent symptoms such as nausea, vomiting, diarrhea, constipation, chest pain, breathing issues, headaches, visual changes, or weight loss. He is scheduled to receive cycle number thirty-nine of Pemetrexed (Alimta) and Pembrolizumab (Keytruda) today.       MEDICAL HISTORY: Past Medical History:  Diagnosis Date   Cervical spondylolysis    Essential hypertension    GERD (gastroesophageal reflux disease)    History of kidney stones    History of migraine    Hyperlipidemia    Hypertension    PONV (postoperative nausea and vomiting)    Type 2 diabetes mellitus (HCC)     ALLERGIES:  has No Known Allergies.  MEDICATIONS:  Current Outpatient Medications  Medication Sig Dispense Refill   cholecalciferol (VITAMIN D) 1000 UNITS tablet Take 1,000 Units by mouth every evening.     Continuous Glucose Sensor (FREESTYLE LIBRE 3 SENSOR) MISC Place 1 sensor on the skin every 14 days. Use to check glucose continuously 6 each 3   dexamethasone (DECADRON) 1 MG tablet Take 1 tablet (1 mg total) by mouth daily with breakfast. 90 tablet 1   docusate sodium (COLACE) 100 MG capsule Take 100 mg by mouth daily.  folic acid (FOLVITE) 1 MG tablet Take 1 tablet by mouth daily. 90 tablet 4   Krill Oil 300 MG CAPS Take 300 mg by mouth every evening.     levothyroxine (SYNTHROID) 88 MCG tablet Take 1 tablet (88 mcg total) by mouth daily before breakfast. 90  tablet 1   loratadine (CLARITIN) 10 MG tablet Take 10 mg by mouth every evening.      losartan (COZAAR) 50 MG tablet Take 1.5 tablets (75 mg total) by mouth daily. 135 tablet 1   mirtazapine (REMERON) 15 MG tablet Take 1 tablet (15 mg total) by mouth at bedtime. 90 tablet 1   Multiple Vitamins-Minerals (MULTIVITAMIN GUMMIES ADULT PO) Take 2 each by mouth daily.     pantoprazole (PROTONIX) 40 MG tablet Take 1 tablet (40 mg total) by mouth every evening. 90 tablet 1   PRESCRIPTION MEDICATION Testosterone injection     prochlorperazine (COMPAZINE) 10 MG tablet Take 1 tablet (10 mg total) by mouth every 6 (six) hours as needed for nausea or vomiting. 30 tablet 0   rosuvastatin (CRESTOR) 20 MG tablet Take 1 tablet (20 mg total) by mouth daily. 90 tablet 0   Semaglutide,0.25 or 0.5MG /DOS, (OZEMPIC, 0.25 OR 0.5 MG/DOSE,) 2 MG/3ML SOPN Inject 0.5 mg into the skin once a week. 9 mL 0   SYRINGE-NEEDLE, DISP, 3 ML 21G X 1-1/2" 3 ML MISC Use to inject testosterone every week 100 each 2   testosterone cypionate (DEPOTESTOTERONE CYPIONATE) 100 MG/ML injection Take 50mg  ( 0.6ml) and 100mg  ( 1ml) into muscle alternatively weekly. 10 mL 1   venlafaxine XR (EFFEXOR-XR) 75 MG 24 hr capsule Take 1 capsule (75 mg total) by mouth daily with breakfast. 90 capsule 0   No current facility-administered medications for this visit.    SURGICAL HISTORY:  Past Surgical History:  Procedure Laterality Date   Bilateral inguinal hernia repair     BRONCHIAL NEEDLE ASPIRATION BIOPSY  04/05/2021   Procedure: BRONCHIAL NEEDLE ASPIRATION BIOPSIES;  Surgeon: Josephine Igo, DO;  [facility]: [facility];  Service: Pulmonary;;   COLONOSCOPY  01/23/2012   Procedure: COLONOSCOPY;  Surgeon: Corbin Ade, MD;  [facility];  Service: Endoscopy;  Laterality: N/A;  9:30 AM   COLONOSCOPY N/A 10/11/2015   Procedure: COLONOSCOPY;  Surgeon: Corbin Ade, MD;  [facility];  Service: Endoscopy;  Laterality: N/A;  830    COLONOSCOPY N/A 10/14/2019   Procedure: COLONOSCOPY;  Surgeon: Corbin Ade, MD;  [facility];  Service: Endoscopy;  Laterality: N/A;  1:45   POLYPECTOMY  10/14/2019   Procedure: POLYPECTOMY;  Surgeon: Corbin Ade, MD;  [facility];  Service: Endoscopy;;  ascending colon, descending colon   VIDEO BRONCHOSCOPY WITH ENDOBRONCHIAL ULTRASOUND N/A 04/05/2021   Procedure: VIDEO BRONCHOSCOPY WITH ENDOBRONCHIAL ULTRASOUND;  Surgeon: Josephine Igo, DO;  Location: [facility];  Service: Pulmonary;  Laterality: N/A;    REVIEW OF SYSTEMS:  A comprehensive review of systems was negative.   PHYSICAL EXAMINATION: General appearance: alert, cooperative, appears stated age, and no distress Head: Normocephalic, without obvious abnormality, atraumatic Neck: no adenopathy, no JVD, supple, symmetrical, trachea midline, and thyroid not enlarged, symmetric, no tenderness/mass/nodules Lymph nodes: Cervical, supraclavicular, and axillary nodes normal. Resp: clear to auscultation bilaterally Back: symmetric, no curvature. ROM normal. No CVA tenderness. Cardio: regular rate and rhythm, S1, S2 normal, no murmur, click, rub or gallop GI: soft, non-tender; bowel sounds normal; no masses,  no organomegaly Extremities: extremities normal, atraumatic,  no cyanosis or edema  ECOG PERFORMANCE STATUS: 1 - Symptomatic but completely ambulatory  Blood pressure (!) 143/83, pulse 94, temperature 98 F (36.7 C), temperature source Oral, resp. rate 17, height 5\' 11"  (1.803 m), weight 183 lb 3 oz (83.1 kg), SpO2 99%.  LABORATORY DATA: Lab Results  Component Value Date   WBC 10.1 07/30/2023   HGB 12.0 (L) 07/30/2023   HCT 40.0 07/30/2023   MCV 86.0 07/30/2023   PLT 337 07/30/2023      Chemistry      Component Value Date/Time   NA 144 07/09/2023 0849   NA 145 (H) 06/07/2023 0825   K 4.0 07/09/2023 0849   CL 109 07/09/2023 0849   CO2 28 07/09/2023 0849   BUN 13 07/09/2023 0849   BUN 19  06/07/2023 0825   CREATININE 0.99 07/09/2023 0849      Component Value Date/Time   CALCIUM 9.2 07/09/2023 0849   ALKPHOS 57 07/09/2023 0849   AST 15 07/09/2023 0849   ALT 13 07/09/2023 0849   BILITOT 0.2 (L) 07/09/2023 0849       RADIOGRAPHIC STUDIES: No results found.  ASSESSMENT AND PLAN: This is a very pleasant 67 years old white male recently diagnosed with stage IV (T1b, N3, M1 C) non-small cell lung cancer favoring adenocarcinoma presented with right upper lobe lung nodule in addition to right hilar, subcarinal and bilateral mediastinal as well as supraclavicular lymphadenopathy.  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  His PD-L1 expression is 80% and his molecular studies showed KRAS G12C mutation. The patient underwent SRS to metastatic brain lesion under the care of Dr. Kathrynn Running and he is currently undergoing systemic chemotherapy with carboplatin for AUC of 5, Alimta 500 Mg/M2 and Keytruda 200 Mg IV every 3 weeks status post 38 cycles.  Starting from cycle #5 the patient will be treated with maintenance treatment with Alimta and Keytruda every 3 weeks.  He has been tolerating this treatment fairly well.    Stage IV Non-Small Cell Lung Cancer (Adenocarcinoma) Diagnosed in June 2022 with brain metastasis treated with SRS. He was treated initially with 4 cycles of systemic chemotherapy with carboplatin, Alimta and Keytruda and starting from cycle #5 he has been on maintenance Alimta and Keytruda status post 38 cycles. Currently on cycle 39 of Alimta and Keytruda. No new symptoms or side effects reported. -Continue Alimta and Keytruda. -Order imaging studies with repeat CT scan of the chest, Abdomen and pelvis in 2 weeks to assess disease status. -Follow-up appointment in 3 weeks.  Mild Anemia Hemoglobin above 12, no symptoms reported. -Monitor closely.   The patient was advised to call immediately if he has any concerning symptoms in the  interval. The patient voices understanding of current disease status and treatment options and is in agreement with the current care plan.  All questions were answered. The patient knows to call the clinic with any problems, questions or concerns. We can certainly see the patient much sooner if necessary. The total time spent in the appointment was 20 minutes.  Disclaimer: This note was dictated with voice recognition software. Similar sounding words can inadvertently be transcribed and may not be corrected upon review.

## 2023-07-31 ENCOUNTER — Other Ambulatory Visit (HOSPITAL_COMMUNITY): Payer: Self-pay

## 2023-07-31 ENCOUNTER — Other Ambulatory Visit: Payer: Self-pay

## 2023-08-01 ENCOUNTER — Other Ambulatory Visit (HOSPITAL_COMMUNITY): Payer: Self-pay

## 2023-08-05 ENCOUNTER — Other Ambulatory Visit: Payer: Self-pay

## 2023-08-06 ENCOUNTER — Other Ambulatory Visit (HOSPITAL_COMMUNITY): Payer: Self-pay

## 2023-08-09 ENCOUNTER — Encounter: Payer: Self-pay | Admitting: Internal Medicine

## 2023-08-09 ENCOUNTER — Other Ambulatory Visit (HOSPITAL_COMMUNITY): Payer: Self-pay

## 2023-08-13 ENCOUNTER — Ambulatory Visit (HOSPITAL_COMMUNITY)
Admission: RE | Admit: 2023-08-13 | Discharge: 2023-08-13 | Disposition: A | Discharge status: Home / Self Care | Payer: Commercial Managed Care - PPO | Source: Ambulatory Visit | Attending: Internal Medicine | Admitting: Internal Medicine

## 2023-08-13 ENCOUNTER — Other Ambulatory Visit: Payer: Self-pay | Admitting: Internal Medicine

## 2023-08-13 DIAGNOSIS — N200 Calculus of kidney: Secondary | ICD-10-CM | POA: Diagnosis not present

## 2023-08-13 DIAGNOSIS — C3490 Malignant neoplasm of unspecified part of unspecified bronchus or lung: Secondary | ICD-10-CM

## 2023-08-13 DIAGNOSIS — C3491 Malignant neoplasm of unspecified part of right bronchus or lung: Secondary | ICD-10-CM

## 2023-08-13 DIAGNOSIS — J439 Emphysema, unspecified: Secondary | ICD-10-CM | POA: Diagnosis not present

## 2023-08-13 DIAGNOSIS — K7689 Other specified diseases of liver: Secondary | ICD-10-CM | POA: Diagnosis not present

## 2023-08-13 MED ORDER — IOHEXOL 300 MG/ML  SOLN
100.0000 mL | Freq: Once | INTRAMUSCULAR | Status: AC | PRN
  Administered 2023-08-13: 100 mL via INTRAVENOUS

## 2023-08-15 ENCOUNTER — Ambulatory Visit
Admission: RE | Admit: 2023-08-15 | Discharge: 2023-08-15 | Disposition: A | Discharge status: Home / Self Care | Payer: Commercial Managed Care - PPO | Source: Ambulatory Visit | Attending: Internal Medicine | Admitting: Internal Medicine

## 2023-08-15 DIAGNOSIS — C7931 Secondary malignant neoplasm of brain: Secondary | ICD-10-CM

## 2023-08-15 DIAGNOSIS — C3490 Malignant neoplasm of unspecified part of unspecified bronchus or lung: Secondary | ICD-10-CM | POA: Diagnosis not present

## 2023-08-15 MED ORDER — GADOPICLENOL 0.5 MMOL/ML IV SOLN
9.0000 mL | Freq: Once | INTRAVENOUS | Status: AC | PRN
  Administered 2023-08-15: 9 mL via INTRAVENOUS

## 2023-08-20 ENCOUNTER — Inpatient Hospital Stay: Payer: Commercial Managed Care - PPO

## 2023-08-20 ENCOUNTER — Inpatient Hospital Stay: Payer: Commercial Managed Care - PPO | Attending: Physician Assistant | Admitting: Internal Medicine

## 2023-08-20 ENCOUNTER — Other Ambulatory Visit: Payer: Self-pay | Admitting: Medical Oncology

## 2023-08-20 ENCOUNTER — Inpatient Hospital Stay: Payer: Commercial Managed Care - PPO | Admitting: Internal Medicine

## 2023-08-20 VITALS — BP 131/84 | HR 96 | Temp 98.3°F | Resp 17 | Ht 71.0 in | Wt 183.3 lb

## 2023-08-20 DIAGNOSIS — Z5111 Encounter for antineoplastic chemotherapy: Secondary | ICD-10-CM | POA: Diagnosis not present

## 2023-08-20 DIAGNOSIS — C7931 Secondary malignant neoplasm of brain: Secondary | ICD-10-CM | POA: Diagnosis not present

## 2023-08-20 DIAGNOSIS — F17210 Nicotine dependence, cigarettes, uncomplicated: Secondary | ICD-10-CM | POA: Insufficient documentation

## 2023-08-20 DIAGNOSIS — Z79899 Other long term (current) drug therapy: Secondary | ICD-10-CM | POA: Insufficient documentation

## 2023-08-20 DIAGNOSIS — C7951 Secondary malignant neoplasm of bone: Secondary | ICD-10-CM | POA: Insufficient documentation

## 2023-08-20 DIAGNOSIS — E230 Hypopituitarism: Secondary | ICD-10-CM | POA: Insufficient documentation

## 2023-08-20 DIAGNOSIS — Z5112 Encounter for antineoplastic immunotherapy: Secondary | ICD-10-CM | POA: Insufficient documentation

## 2023-08-20 DIAGNOSIS — C3491 Malignant neoplasm of unspecified part of right bronchus or lung: Secondary | ICD-10-CM

## 2023-08-20 DIAGNOSIS — C3411 Malignant neoplasm of upper lobe, right bronchus or lung: Secondary | ICD-10-CM | POA: Diagnosis not present

## 2023-08-20 LAB — CBC WITH DIFFERENTIAL (CANCER CENTER ONLY)
Abs Immature Granulocytes: 0.06 10*3/uL (ref 0.00–0.07)
Basophils Absolute: 0.1 10*3/uL (ref 0.0–0.1)
Basophils Relative: 0 %
Eosinophils Absolute: 0 10*3/uL (ref 0.0–0.5)
Eosinophils Relative: 0 %
HCT: 45.2 % (ref 39.0–52.0)
Hemoglobin: 13.6 g/dL (ref 13.0–17.0)
Immature Granulocytes: 1 %
Lymphocytes Relative: 9 %
Lymphs Abs: 1.1 10*3/uL (ref 0.7–4.0)
MCH: 25.8 pg — ABNORMAL LOW (ref 26.0–34.0)
MCHC: 30.1 g/dL (ref 30.0–36.0)
MCV: 85.8 fL (ref 80.0–100.0)
Monocytes Absolute: 0.4 10*3/uL (ref 0.1–1.0)
Monocytes Relative: 3 %
Neutro Abs: 10.6 10*3/uL — ABNORMAL HIGH (ref 1.7–7.7)
Neutrophils Relative %: 87 %
Platelet Count: 288 10*3/uL (ref 150–400)
RBC: 5.27 MIL/uL (ref 4.22–5.81)
RDW: 20.9 % — ABNORMAL HIGH (ref 11.5–15.5)
WBC Count: 12.3 10*3/uL — ABNORMAL HIGH (ref 4.0–10.5)
nRBC: 0 % (ref 0.0–0.2)

## 2023-08-20 LAB — CMP (CANCER CENTER ONLY)
ALT: 22 U/L (ref 0–44)
AST: 19 U/L (ref 15–41)
Albumin: 4.2 g/dL (ref 3.5–5.0)
Alkaline Phosphatase: 72 U/L (ref 38–126)
Anion gap: 7 (ref 5–15)
BUN: 15 mg/dL (ref 8–23)
CO2: 30 mmol/L (ref 22–32)
Calcium: 9.7 mg/dL (ref 8.9–10.3)
Chloride: 105 mmol/L (ref 98–111)
Creatinine: 1 mg/dL (ref 0.61–1.24)
GFR, Estimated: >60 mL/min (ref >60)
Glucose, Bld: 87 mg/dL (ref 70–99)
Potassium: 3.6 mmol/L (ref 3.5–5.1)
Sodium: 142 mmol/L (ref 135–145)
Total Bilirubin: 0.3 mg/dL (ref <1.2)
Total Protein: 7.5 g/dL (ref 6.5–8.1)

## 2023-08-20 MED ORDER — SODIUM CHLORIDE 0.9 % IV SOLN
500.0000 mg/m2 | Freq: Once | INTRAVENOUS | Status: AC
  Administered 2023-08-20: 1000 mg via INTRAVENOUS
  Filled 2023-08-20: qty 40

## 2023-08-20 MED ORDER — PROCHLORPERAZINE MALEATE 10 MG PO TABS
10.0000 mg | ORAL_TABLET | Freq: Once | ORAL | Status: AC
Start: 2023-08-20 — End: 2023-08-20
  Administered 2023-08-20: 10 mg via ORAL
  Filled 2023-08-20: qty 1

## 2023-08-20 MED ORDER — SODIUM CHLORIDE 0.9 % IV SOLN
200.0000 mg | Freq: Once | INTRAVENOUS | Status: AC
  Administered 2023-08-20: 200 mg via INTRAVENOUS
  Filled 2023-08-20: qty 200

## 2023-08-20 MED ORDER — SODIUM CHLORIDE 0.9 % IV SOLN: Freq: Once | INTRAVENOUS | Status: AC

## 2023-08-20 NOTE — Progress Notes (Signed)
[facility] at [facility] [address]  [place], [place] [address] [phone]   Interval Evaluation  Date of Service: 08/20/23 Patient Name: Bradley Goodman Patient MRN: [id number] Patient DOB: 09/23/1956 Provider: Henreitta Leber, MD  Identifying Statement:  Bradley Goodman is a 67 y.o. male with Malignant neoplasm metastatic to brain [facility])   Primary Cancer:  Oncologic History: Oncology History  Adenocarcinoma of right lung, stage 4 (HCC)  04/25/2021 Initial Diagnosis   Adenocarcinoma of right lung, stage 4 (HCC)   04/25/2021 Cancer Staging   Staging form: Lung, AJCC 8th Edition - Clinical: Stage IVB (cT1b, cN3, cM1c) - Signed by Si Gaul, MD on 04/25/2021   05/08/2021 -  Chemotherapy   Patient is on Treatment Plan : LUNG Carboplatin (5) + Pemetrexed (500) + Pembrolizumab (200) D1 q21d Induction x 4 cycles / Maintenance Pemetrexed (500) + Pembrolizumab (200) D1 q21d      CNS Oncologic History 05/04/21: Radiosurgery to 4 brain metastases, 5 fractions to pituitary lesion  Interval History: Bradley Goodman presents today for follow up after recent MRI brain.  Denies and new or worsening neurologic symptoms today.  No issues with visual impairment as prior.  Remains active with his garden, golfing.  Denies seizures and headaches.  H+P (05/23/21) Patient presented to medical attention initially on 03/27/21 with new onset left eyelid drooping.  CNS imaging was obtained, which demonstrated multiple enhancing lesions consistent with brain metastases.  Systemic workup demonstrated lung cancer, which was confirmed through endobronchial biopsy.  He underwent single fraction radiosurgery to brain lesions, and fractionated radiosurgery to pituitary lesion.  Following radiation, eyelid drooping improved, but he developed "tunnel vision" visual deficits, described as "like looking through plastic".  This has been more or less static over the past week or so, though  it did improve transiently after dosing decadron with chemotherapy on 05/08/21.   Medications: Current Outpatient Medications on File Prior to Visit  Medication Sig Dispense Refill   cholecalciferol (VITAMIN D) 1000 UNITS tablet Take 1,000 Units by mouth every evening.     Continuous Glucose Sensor (FREESTYLE LIBRE 3 SENSOR) MISC Place 1 sensor on the skin every 14 days. Use to check glucose continuously 6 each 3   dexamethasone (DECADRON) 1 MG tablet Take 1 tablet (1 mg total) by mouth daily with breakfast. 90 tablet 1   docusate sodium (COLACE) 100 MG capsule Take 100 mg by mouth daily.     folic acid (FOLVITE) 1 MG tablet Take 1 tablet by mouth daily. 90 tablet 4   Krill Oil 300 MG CAPS Take 300 mg by mouth every evening.     levothyroxine (SYNTHROID) 88 MCG tablet Take 1 tablet (88 mcg total) by mouth daily before breakfast. 90 tablet 1   loratadine (CLARITIN) 10 MG tablet Take 10 mg by mouth every evening.      losartan (COZAAR) 50 MG tablet Take 1.5 tablets (75 mg total) by mouth daily. 135 tablet 1   mirtazapine (REMERON) 15 MG tablet Take 1 tablet (15 mg total) by mouth at bedtime. 90 tablet 1   Multiple Vitamins-Minerals (MULTIVITAMIN GUMMIES ADULT PO) Take 2 each by mouth daily.     pantoprazole (PROTONIX) 40 MG tablet Take 1 tablet (40 mg total) by mouth every evening. 90 tablet 1   PRESCRIPTION MEDICATION Testosterone injection     prochlorperazine (COMPAZINE) 10 MG tablet Take 1 tablet (10 mg total) by mouth every 6 (six) hours as needed for nausea or  vomiting. 30 tablet 0   rosuvastatin (CRESTOR) 20 MG tablet Take 1 tablet (20 mg total) by mouth daily. 90 tablet 0   Semaglutide,0.25 or 0.5MG /DOS, (OZEMPIC, 0.25 OR 0.5 MG/DOSE,) 2 MG/3ML SOPN Inject 0.5 mg into the skin once a week. 9 mL 0   SYRINGE-NEEDLE, DISP, 3 ML 21G X 1-1/2" 3 ML MISC Use to inject testosterone every week 100 each 2   testosterone cypionate (DEPOTESTOTERONE CYPIONATE) 100 MG/ML injection Take 50mg  ( 0.41ml) and  100mg  ( 1ml) into muscle alternatively weekly. 10 mL 1   venlafaxine XR (EFFEXOR-XR) 75 MG 24 hr capsule Take 1 capsule (75 mg total) by mouth daily with breakfast. 90 capsule 0   No current facility-administered medications on file prior to visit.    Allergies: No Known Allergies Past Medical History:  Past Medical History:  Diagnosis Date   Cervical spondylolysis    Essential hypertension    GERD (gastroesophageal reflux disease)    History of kidney stones    History of migraine    Hyperlipidemia    Hypertension    PONV (postoperative nausea and vomiting)    Type 2 diabetes mellitus (HCC)    Past Surgical History:  Past Surgical History:  Procedure Laterality Date   Bilateral inguinal hernia repair     BRONCHIAL NEEDLE ASPIRATION BIOPSY  04/05/2021   Procedure: BRONCHIAL NEEDLE ASPIRATION BIOPSIES;  Surgeon: Josephine Igo, DO;  [facility]: [facility];  Service: Pulmonary;;   COLONOSCOPY  01/23/2012   Procedure: COLONOSCOPY;  Surgeon: Corbin Ade, MD;  Location: [facility];  Service: Endoscopy;  Laterality: N/A;  9:30 AM   COLONOSCOPY N/A 10/11/2015   Procedure: COLONOSCOPY;  Surgeon: Corbin Ade, MD;  Location: [facility];  Service: Endoscopy;  Laterality: N/A;  830   COLONOSCOPY N/A 10/14/2019   Procedure: COLONOSCOPY;  Surgeon: Corbin Ade, MD;  Location: [facility];  Service: Endoscopy;  Laterality: N/A;  1:45   POLYPECTOMY  10/14/2019   Procedure: POLYPECTOMY;  Surgeon: Corbin Ade, MD;  Location: [facility];  Service: Endoscopy;;  ascending colon, descending colon   VIDEO BRONCHOSCOPY WITH ENDOBRONCHIAL ULTRASOUND N/A 04/05/2021   Procedure: VIDEO BRONCHOSCOPY WITH ENDOBRONCHIAL ULTRASOUND;  Surgeon: Josephine Igo, DO;  Location: [facility];  Service: Pulmonary;  Laterality: N/A;   Social History:  Social History   Socioeconomic History   Marital status: Married    Spouse name: Not on file   Number of children: Not on file   Years of  education: Not on file   Highest education level: Bachelor's degree (e.g., BA, AB, BS)  Occupational History   Not on file  Tobacco Use   Smoking status: Every Day    Current packs/day: 0.50    Average packs/day: 0.5 packs/day for 49.8 years (24.9 ttl pk-yrs)    Types: Cigarettes    Start date: 10/30/1973   Smokeless tobacco: Never  Vaping Use   Vaping status: Never Used  Substance and Sexual Activity   Alcohol use: Not Currently    Comment: One drink every 6 months.   Drug use: No   Sexual activity: Yes  Other Topics Concern   Not on file  Social History Narrative   Not on file   Social Determinants of Health   Financial Resource Strain: Patient Declined (04/15/2023)   Overall Financial Resource Strain (CARDIA)    Difficulty of Paying Living Expenses: Patient declined  Food Insecurity: No Food Insecurity (04/15/2023)   Hunger Vital Sign  Worried About Programme researcher, broadcasting/film/video in the Last Year: Never true    Ran Out of Food in the Last Year: Never true  Transportation Needs: No Transportation Needs (04/15/2023)   PRAPARE - Administrator, Civil Service (Medical): No    Lack of Transportation (Non-Medical): No  Physical Activity: Unknown (04/15/2023)   Exercise Vital Sign    Days of Exercise per Week: 1 day    Minutes of Exercise per Session: Patient declined  Stress: No Stress Concern Present (04/15/2023)   [employer] of Occupational Health - Occupational Stress Questionnaire    Feeling of Stress : Only a little  Social Connections: Unknown (04/15/2023)   Social Connection and Isolation Panel [NHANES]    Frequency of Communication with Friends and Family: Patient declined    Frequency of Social Gatherings with Friends and Family: Patient declined    Attends Religious Services: Not on Marketing executive or Organizations: No    Attends Engineer, structural: Not on file    Marital Status: Married  Catering manager Violence: Not on file   Family  History:  Family History  Problem Relation Age of Onset   Hypertension Mother    Diabetes Mother    Heart attack Mother    Hypertension Father    Heart attack Father    Heart attack Brother    Colon cancer Neg Hx     Review of Systems: Constitutional: Doesn't report fevers, chills or abnormal weight loss Eyes: Doesn't report blurriness of vision Ears, nose, mouth, throat, and face: Doesn't report sore throat Respiratory: Doesn't report cough, dyspnea or wheezes Cardiovascular: Doesn't report palpitation, chest discomfort  Gastrointestinal:  Doesn't report nausea, constipation, diarrhea GU: Doesn't report incontinence Skin: Doesn't report skin rashes Neurological: Per HPI Musculoskeletal: Doesn't report joint pain Behavioral/Psych: Doesn't report anxiety  Physical Exam: There were no vitals filed for this visit.   KPS: 90. General: Alert, cooperative, pleasant, in no acute distress Head: Normal EENT: No conjunctival injection or scleral icterus.  Lungs: Resp effort normal Cardiac: Regular rate Abdomen: Non-distended abdomen Skin: No rashes cyanosis or petechiae. Extremities: No clubbing or edema  Neurologic Exam: Mental Status: Awake, alert, attentive to examiner. Oriented to self and environment. Language is fluent with intact comprehension.  Cranial Nerves: Visual acuity is grossly normal. Visual fields are full. Extra-ocular movements intact. No ptosis. Face is symmetric Motor: Tone and bulk are normal. Power is full in both arms and legs. Reflexes are symmetric, no pathologic reflexes present.  Sensory: Intact to light touch Gait: Normal.   Labs: I have reviewed the data as listed    Component Value Date/Time   NA 141 07/30/2023 0826   NA 145 (H) 06/07/2023 0825   K 4.1 07/30/2023 0826   CL 108 07/30/2023 0826   CO2 27 07/30/2023 0826   GLUCOSE 100 (H) 07/30/2023 0826   BUN 12 07/30/2023 0826   BUN 19 06/07/2023 0825   CREATININE 1.04 07/30/2023 0826    CALCIUM 9.1 07/30/2023 0826   PROT 6.7 07/30/2023 0826   PROT 6.2 06/07/2023 0825   ALBUMIN 3.7 07/30/2023 0826   ALBUMIN 3.9 06/07/2023 0825   AST 19 07/30/2023 0826   ALT 16 07/30/2023 0826   ALKPHOS 61 07/30/2023 0826   BILITOT 0.3 07/30/2023 0826   GFRNONAA >60 07/30/2023 0826   GFRAA 82 07/14/2020 0945   Lab Results  Component Value Date   WBC 12.3 (H) 08/20/2023   NEUTROABS 10.6 (  H) 08/20/2023   HGB 13.6 08/20/2023   HCT 45.2 08/20/2023   MCV 85.8 08/20/2023   PLT 288 08/20/2023   Imaging:  [facility] Clinician Interpretation: I have personally reviewed the CNS images as listed.  My interpretation, in the context of the patient's clinical presentation, is stable disease  MR BRAIN W WO CONTRAST  Result Date: 08/15/2023 CLINICAL DATA:  Brain/CNS neoplasm, assess treatment response. Follow-up metastases from lung cancer. EXAM: MRI HEAD WITHOUT AND WITH CONTRAST TECHNIQUE: Multiplanar, multiecho pulse sequences of the brain and surrounding structures were obtained without and with intravenous contrast. CONTRAST:  9 mL Vueway. COMPARISON:  MRI brain 02/22/2023 and older. FINDINGS: Brain: Increased conspicuity of the enhancing lesion in the right parietal lobe, measuring up to 3 mm (axial image 85 series 14). Unchanged punctate enhancing lesion in the left cerebellar hemisphere (axial image 42 series 14). No new enhancing lesions. Stable background of moderate chronic small-vessel disease. No hydrocephalus, extra-axial collection or midline shift. No foci of abnormal susceptibility. Vascular: Normal flow voids and vessel enhancement. Skull and upper cervical spine: Normal marrow signal and enhancement. Sinuses/Orbits: Unchanged trace fluid in the right mastoid air cells. Paranasal sinuses and orbits are unremarkable. Other: None. IMPRESSION: Increased conspicuity of the enhancing lesion in the right parietal lobe, measuring up to 3 mm. Unchanged punctate enhancing lesion in the left cerebellar  hemisphere. No new enhancing lesions. Electronically Signed   By: Orvan Falconer M.D.   On: 08/15/2023 15:40   CT CHEST ABDOMEN PELVIS W CONTRAST  Result Date: 08/14/2023 CLINICAL DATA:  Lung cancer restaging * Tracking Code: BO * EXAM: CT CHEST, ABDOMEN, AND PELVIS WITH CONTRAST TECHNIQUE: Multidetector CT imaging of the chest, abdomen and pelvis was performed following the standard protocol during bolus administration of intravenous contrast. RADIATION DOSE REDUCTION: This exam was performed according to the departmental dose-optimization program which includes automated exposure control, adjustment of the mA and/or kV according to patient size and/or use of iterative reconstruction technique. CONTRAST:  OMNIPAQUE IOHEXOL 300 MG/ML  SOLN COMPARISON:  05/23/2023 FINDINGS: CT CHEST FINDINGS Cardiovascular: Aortic atherosclerosis. Normal heart size. Left and right coronary artery calcifications. No pericardial effusion. Mediastinum/Nodes: No enlarged mediastinal, hilar, or axillary lymph nodes. Thyroid gland, trachea, and esophagus demonstrate no significant findings. Lungs/Pleura: Mild paraseptal emphysema. Background fine centrilobular nodularity throughout the lungs. New small solid nodule of the posterior left upper lobe measuring 0.3 cm (series 4, image 81). Multiple additional small bilateral nodules of varying composition are unchanged, for example largest subsolid nodule within a cluster in the right upper lobe again measuring 0.6 cm (series 4, image 67), a ground-glass nodule of the posterior right upper lobe measuring 0.6 cm (series 4, image 48), and a ground-glass nodule of the peripheral left upper lobe measuring 0.5 cm (series 4, image 81). No pleural effusion or pneumothorax. Musculoskeletal: No chest wall abnormality. No acute osseous findings. CT ABDOMEN PELVIS FINDINGS Hepatobiliary: No solid liver abnormality is seen. Unchanged subcentimeter cysts of the right lobe of the liver, benign,  requiring no further follow-up or characterization (series 2, image 73). Contracted gallbladder. No gallstones, gallbladder wall thickening, or biliary dilatation. Pancreas: Unremarkable. No pancreatic ductal dilatation or surrounding inflammatory changes. Spleen: Normal in size without significant abnormality. Adrenals/Urinary Tract: Adrenal glands are unremarkable. Nonobstructive calculus of the superior left kidney. No right-sided calculi, ureteral calculi, hydronephrosis. Bladder is unremarkable. Stomach/Bowel: Stomach is within normal limits. Appendix appears normal. No evidence of bowel wall thickening, distention, or inflammatory changes. Descending colonic diverticulosis. Vascular/Lymphatic: No  significant vascular findings are present. No enlarged abdominal or pelvic lymph nodes. Reproductive: No mass or other abnormality. Other: No abdominal wall hernia or abnormality. No ascites. Musculoskeletal: No acute osseous findings. IMPRESSION: 1. New small solid nodule of the posterior left upper lobe measuring 0.3 cm. 2. Multiple additional small bilateral nodules of varying composition are unchanged. 3. No evidence of lymphadenopathy or metastatic disease in the chest, abdomen, or pelvis. 4. Emphysema, diffuse bilateral bronchial wall thickening, and background of fine centrilobular nodularity, consistent with smoking-related respiratory bronchiolitis. 5. Coronary artery disease. 6. Nonobstructive left nephrolithiasis. Aortic Atherosclerosis (ICD10-I70.0) and Emphysema (ICD10-J43.9). Electronically Signed   By: Jearld Lesch M.D.   On: 08/14/2023 13:10     Assessment/Plan Malignant neoplasm metastatic to brain [facility])  Bradley Goodman is clinically and radiographically stable today.  MRI brain demonstrates very subtle increase in enhancement within treated right parietal lesion.  Change not c/w tumor progression.  For pan-hypopit, will continue to follow with Dr. Fransico Him.    He will con't to undergo  chemotherapy with Dr. Arbutus Ped.  We appreciate the opportunity to participate in the care of Bradley Goodman.   We ask that Bradley Goodman return to clinic in 6 months following next brain MRI, or sooner as needed.  All questions were answered. The patient knows to call the clinic with any problems, questions or concerns. No barriers to learning were detected.  The total time spent in the encounter was 30 minutes and more than 50% was on counseling and review of test results   Henreitta Leber, MD Medical Director of Neuro-Oncology [facility] at [place] 08/20/23 11:52 AM

## 2023-08-20 NOTE — Progress Notes (Signed)
[facility] - [facility] [phone]   [phone]  OFFICE PROGRESS NOTE  [name], FNP [address] [place] [address]  DIAGNOSIS: Stage IV (T1b, N3, M1C) non-small cell lung cancer, favoring adenocarcinoma presented with right upper lobe lung nodule in addition to right hilar, subcarinal and bilateral mediastinal as well as supraclavicular lymphadenopathy in addition to bone and brain metastasis diagnosed in June 2022.     PD-L1 expression 80%.     Molecular Studies:  Biomarker Findings Microsatellite status - MS-Stable Tumor Mutational Burden - 6 Muts/Mb Genomic Findings For a complete list of the genes assayed, please refer to the Appendix. KRAS G12C, amplification ATM S470* CCND1 amplification - equivocal? HGF amplification - equivocal? MYC amplification - equivocal? FGF19 amplification - equivocal? FGF3 amplification - equivocal? FGF4 amplification - equivocal? NFKBIA amplification NKX2-1 amplification RAD21 amplification - equivocal? [id number]*26 TERT promoter -124C>T TP53 rearrangement exon 9 7 Disease relevant genes with no reportable alterations: ALK, BRAF, EGFR, ERBB2, MET, RET, ROS1   PRIOR THERAPY: SRS to the metastatic brain lesions under the care of Dr. Kathrynn Running.  Last treatment on 05/04/2021.   CURRENT THERAPY: Palliative systemic chemotherapy with carboplatin for an AUC 5, Alimta 500 mg/m2 and, Keytruda 200 mg IV every 3 weeks.  First dose expected on 05/08/2021.  Status post 39 cycles.  Starting from cycle #5 he is on maintenance treatment with Alimta and Keytruda every 3 weeks.  INTERVAL HISTORY: Bun Rinne Schaffer 67 y.o. male returns to the clinic today for follow-up visit accompanied by his wife. Discussed the use of AI scribe software for clinical note transcription with the patient, who gave verbal consent to proceed.  History of Present Illness   The patient, a 67 year old with a history of stage four  non-small cell lung cancer adenocarcinoma, was diagnosed in June 2000. He has an 18% expression and also contains a K-RAS G12C mutation. He underwent chemotherapy with Carboplatin, Alimta, and Keytruda for four cycles and then transitioned to Alimta and Keytruda every three months for a total of 39 cycles.  Since the last visit three weeks ago, the patient reports no new symptoms or changes in health status. He denies experiencing chest pain, breathing issues, cough, nausea, vomiting, diarrhea, headaches, or changes in vision. The patient's weight has remained stable, with a one-pound loss since the last visit.       MEDICAL HISTORY: Past Medical History:  Diagnosis Date   Cervical spondylolysis    Essential hypertension    GERD (gastroesophageal reflux disease)    History of kidney stones    History of migraine    Hyperlipidemia    Hypertension    PONV (postoperative nausea and vomiting)    Type 2 diabetes mellitus (HCC)     ALLERGIES:  has No Known Allergies.  MEDICATIONS:  Current Outpatient Medications  Medication Sig Dispense Refill   cholecalciferol (VITAMIN D) 1000 UNITS tablet Take 1,000 Units by mouth every evening.     Continuous Glucose Sensor (FREESTYLE LIBRE 3 SENSOR) MISC Place 1 sensor on the skin every 14 days. Use to check glucose continuously 6 each 3   dexamethasone (DECADRON) 1 MG tablet Take 1 tablet (1 mg total) by mouth daily with breakfast. 90 tablet 1   docusate sodium (COLACE) 100 MG capsule Take 100 mg by mouth daily.     folic acid (FOLVITE) 1 MG tablet Take 1 tablet by mouth daily. 90 tablet 4   Krill Oil 300 MG CAPS Take  300 mg by mouth every evening.     levothyroxine (SYNTHROID) 88 MCG tablet Take 1 tablet (88 mcg total) by mouth daily before breakfast. 90 tablet 1   loratadine (CLARITIN) 10 MG tablet Take 10 mg by mouth every evening.      losartan (COZAAR) 50 MG tablet Take 1.5 tablets (75 mg total) by mouth daily. 135 tablet 1   mirtazapine  (REMERON) 15 MG tablet Take 1 tablet (15 mg total) by mouth at bedtime. 90 tablet 1   Multiple Vitamins-Minerals (MULTIVITAMIN GUMMIES ADULT PO) Take 2 each by mouth daily.     pantoprazole (PROTONIX) 40 MG tablet Take 1 tablet (40 mg total) by mouth every evening. 90 tablet 1   PRESCRIPTION MEDICATION Testosterone injection     prochlorperazine (COMPAZINE) 10 MG tablet Take 1 tablet (10 mg total) by mouth every 6 (six) hours as needed for nausea or vomiting. 30 tablet 0   rosuvastatin (CRESTOR) 20 MG tablet Take 1 tablet (20 mg total) by mouth daily. 90 tablet 0   Semaglutide,0.25 or 0.5MG /DOS, (OZEMPIC, 0.25 OR 0.5 MG/DOSE,) 2 MG/3ML SOPN Inject 0.5 mg into the skin once a week. 9 mL 0   SYRINGE-NEEDLE, DISP, 3 ML 21G X 1-1/2" 3 ML MISC Use to inject testosterone every week 100 each 2   testosterone cypionate (DEPOTESTOTERONE CYPIONATE) 100 MG/ML injection Take 50mg  ( 0.15ml) and 100mg  ( 1ml) into muscle alternatively weekly. 10 mL 1   venlafaxine XR (EFFEXOR-XR) 75 MG 24 hr capsule Take 1 capsule (75 mg total) by mouth daily with breakfast. 90 capsule 0   No current facility-administered medications for this visit.    SURGICAL HISTORY:  Past Surgical History:  Procedure Laterality Date   Bilateral inguinal hernia repair     BRONCHIAL NEEDLE ASPIRATION BIOPSY  04/05/2021   Procedure: BRONCHIAL NEEDLE ASPIRATION BIOPSIES;  Surgeon: Josephine Igo, DO;  Location: [facility];  Service: Pulmonary;;   COLONOSCOPY  01/23/2012   Procedure: COLONOSCOPY;  Surgeon: Corbin Ade, MD;  Location: [facility];  Service: Endoscopy;  Laterality: N/A;  9:30 AM   COLONOSCOPY N/A 10/11/2015   Procedure: COLONOSCOPY;  Surgeon: Corbin Ade, MD;  Location: [facility];  Service: Endoscopy;  Laterality: N/A;  830   COLONOSCOPY N/A 10/14/2019   Procedure: COLONOSCOPY;  Surgeon: Corbin Ade, MD;  Location: [facility];  Service: Endoscopy;  Laterality: N/A;  1:45   POLYPECTOMY  10/14/2019   Procedure:  POLYPECTOMY;  Surgeon: Corbin Ade, MD;  Location: [facility];  Service: Endoscopy;;  ascending colon, descending colon   VIDEO BRONCHOSCOPY WITH ENDOBRONCHIAL ULTRASOUND N/A 04/05/2021   Procedure: VIDEO BRONCHOSCOPY WITH ENDOBRONCHIAL ULTRASOUND;  Surgeon: Josephine Igo, DO;  Location: [facility];  Service: Pulmonary;  Laterality: N/A;    REVIEW OF SYSTEMS:  Constitutional: negative Eyes: negative Ears, nose, mouth, throat, and face: negative Respiratory: negative Cardiovascular: negative Gastrointestinal: negative Genitourinary:negative Integument/breast: negative Hematologic/lymphatic: negative Musculoskeletal:negative Neurological: negative Behavioral/Psych: negative Endocrine: negative Allergic/Immunologic: negative   PHYSICAL EXAMINATION: General appearance: alert, cooperative, appears stated age, and no distress Head: Normocephalic, without obvious abnormality, atraumatic Neck: no adenopathy, no JVD, supple, symmetrical, trachea midline, and thyroid not enlarged, symmetric, no tenderness/mass/nodules Lymph nodes: Cervical, supraclavicular, and axillary nodes normal. Resp: clear to auscultation bilaterally Back: symmetric, no curvature. ROM normal. No CVA tenderness. Cardio: regular rate and rhythm, S1, S2 normal, no murmur, click, rub or gallop GI: soft, non-tender; bowel sounds normal; no masses,  no organomegaly Extremities: extremities normal,  atraumatic, no cyanosis or edema Neurologic: Alert and oriented X 3, normal strength and tone. Normal symmetric reflexes. Normal coordination and gait  ECOG PERFORMANCE STATUS: 1 - Symptomatic but completely ambulatory  Blood pressure 131/84, pulse 96, temperature 98.3 F (36.8 C), temperature source Temporal, resp. rate 17, height 5\' 11"  (1.803 m), weight 183 lb 4.8 oz (83.1 kg), SpO2 98%.  LABORATORY DATA: Lab Results  Component Value Date   WBC 10.1 07/30/2023   HGB 12.0 (L) 07/30/2023   HCT 40.0 07/30/2023    MCV 86.0 07/30/2023   PLT 337 07/30/2023      Chemistry      Component Value Date/Time   NA 141 07/30/2023 0826   NA 145 (H) 06/07/2023 0825   K 4.1 07/30/2023 0826   CL 108 07/30/2023 0826   CO2 27 07/30/2023 0826   BUN 12 07/30/2023 0826   BUN 19 06/07/2023 0825   CREATININE 1.04 07/30/2023 0826      Component Value Date/Time   CALCIUM 9.1 07/30/2023 0826   ALKPHOS 61 07/30/2023 0826   AST 19 07/30/2023 0826   ALT 16 07/30/2023 0826   BILITOT 0.3 07/30/2023 0826       RADIOGRAPHIC STUDIES: MR BRAIN W WO CONTRAST  Result Date: 08/15/2023 CLINICAL DATA:  Brain/CNS neoplasm, assess treatment response. Follow-up metastases from lung cancer. EXAM: MRI HEAD WITHOUT AND WITH CONTRAST TECHNIQUE: Multiplanar, multiecho pulse sequences of the brain and surrounding structures were obtained without and with intravenous contrast. CONTRAST:  9 mL Vueway. COMPARISON:  MRI brain 02/22/2023 and older. FINDINGS: Brain: Increased conspicuity of the enhancing lesion in the right parietal lobe, measuring up to 3 mm (axial image 85 series 14). Unchanged punctate enhancing lesion in the left cerebellar hemisphere (axial image 42 series 14). No new enhancing lesions. Stable background of moderate chronic small-vessel disease. No hydrocephalus, extra-axial collection or midline shift. No foci of abnormal susceptibility. Vascular: Normal flow voids and vessel enhancement. Skull and upper cervical spine: Normal marrow signal and enhancement. Sinuses/Orbits: Unchanged trace fluid in the right mastoid air cells. Paranasal sinuses and orbits are unremarkable. Other: None. IMPRESSION: Increased conspicuity of the enhancing lesion in the right parietal lobe, measuring up to 3 mm. Unchanged punctate enhancing lesion in the left cerebellar hemisphere. No new enhancing lesions. Electronically Signed   By: Orvan Falconer M.D.   On: 08/15/2023 15:40   CT CHEST ABDOMEN PELVIS W CONTRAST  Result Date:  08/14/2023 CLINICAL DATA:  Lung cancer restaging * Tracking Code: BO * EXAM: CT CHEST, ABDOMEN, AND PELVIS WITH CONTRAST TECHNIQUE: Multidetector CT imaging of the chest, abdomen and pelvis was performed following the standard protocol during bolus administration of intravenous contrast. RADIATION DOSE REDUCTION: This exam was performed according to the departmental dose-optimization program which includes automated exposure control, adjustment of the mA and/or kV according to patient size and/or use of iterative reconstruction technique. CONTRAST:  OMNIPAQUE IOHEXOL 300 MG/ML  SOLN COMPARISON:  05/23/2023 FINDINGS: CT CHEST FINDINGS Cardiovascular: Aortic atherosclerosis. Normal heart size. Left and right coronary artery calcifications. No pericardial effusion. Mediastinum/Nodes: No enlarged mediastinal, hilar, or axillary lymph nodes. Thyroid gland, trachea, and esophagus demonstrate no significant findings. Lungs/Pleura: Mild paraseptal emphysema. Background fine centrilobular nodularity throughout the lungs. New small solid nodule of the posterior left upper lobe measuring 0.3 cm (series 4, image 81). Multiple additional small bilateral nodules of varying composition are unchanged, for example largest subsolid nodule within a cluster in the right upper lobe again measuring 0.6 cm (series 4,  image 67), a ground-glass nodule of the posterior right upper lobe measuring 0.6 cm (series 4, image 48), and a ground-glass nodule of the peripheral left upper lobe measuring 0.5 cm (series 4, image 81). No pleural effusion or pneumothorax. Musculoskeletal: No chest wall abnormality. No acute osseous findings. CT ABDOMEN PELVIS FINDINGS Hepatobiliary: No solid liver abnormality is seen. Unchanged subcentimeter cysts of the right lobe of the liver, benign, requiring no further follow-up or characterization (series 2, image 73). Contracted gallbladder. No gallstones, gallbladder wall thickening, or biliary dilatation.  Pancreas: Unremarkable. No pancreatic ductal dilatation or surrounding inflammatory changes. Spleen: Normal in size without significant abnormality. Adrenals/Urinary Tract: Adrenal glands are unremarkable. Nonobstructive calculus of the superior left kidney. No right-sided calculi, ureteral calculi, hydronephrosis. Bladder is unremarkable. Stomach/Bowel: Stomach is within normal limits. Appendix appears normal. No evidence of bowel wall thickening, distention, or inflammatory changes. Descending colonic diverticulosis. Vascular/Lymphatic: No significant vascular findings are present. No enlarged abdominal or pelvic lymph nodes. Reproductive: No mass or other abnormality. Other: No abdominal wall hernia or abnormality. No ascites. Musculoskeletal: No acute osseous findings. IMPRESSION: 1. New small solid nodule of the posterior left upper lobe measuring 0.3 cm. 2. Multiple additional small bilateral nodules of varying composition are unchanged. 3. No evidence of lymphadenopathy or metastatic disease in the chest, abdomen, or pelvis. 4. Emphysema, diffuse bilateral bronchial wall thickening, and background of fine centrilobular nodularity, consistent with smoking-related respiratory bronchiolitis. 5. Coronary artery disease. 6. Nonobstructive left nephrolithiasis. Aortic Atherosclerosis (ICD10-I70.0) and Emphysema (ICD10-J43.9). Electronically Signed   By: Jearld Lesch M.D.   On: 08/14/2023 13:10    ASSESSMENT AND PLAN: This is a very pleasant 67 years old white male recently diagnosed with stage IV (T1b, N3, M1 C) non-small cell lung cancer favoring adenocarcinoma presented with right upper lobe lung nodule in addition to right hilar, subcarinal and bilateral mediastinal as well as supraclavicular lymphadenopathy.  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  His PD-L1 expression is 80% and his molecular studies showed KRAS G12C mutation. The patient underwent SRS to metastatic brain  lesion under the care of Dr. Kathrynn Running and he is currently undergoing systemic chemotherapy with carboplatin for AUC of 5, Alimta 500 Mg/M2 and Keytruda 200 Mg IV every 3 weeks status post 39 cycles.  Starting from cycle #5 the patient will be treated with maintenance treatment with Alimta and Keytruda every 3 weeks.  He has been tolerating this treatment fairly well.  The patient had repeat CT scan of the chest, abdomen and pelvis performed recently.  I personally and independently reviewed the scan images and discussed the result and showed the images to the patient and his wife. His scan showed no concerning findings for disease progression except for new small solid nodule of the posterior left upper lobe measuring 0.3 cm.    Stage IV Non-Small Cell Lung Cancer (NSCLC) Adenocarcinoma Diagnosed June 2022 with stage IV NSCLC adenocarcinoma. 18% PD-L1 expression, KRAS G12C mutation. Initially treated with carboplatin, pemetrexed, and pembrolizumab for 4 cycles, followed by maintenance therapy with pemetrexed and pembrolizumab every 3 weeks. Currently on 39th cycle. Recent CT shows new 3mm left lung nodule, likely inflammatory. No new symptoms. Weight stable with 1-pound loss. Brain MRI shows 3mm area of concern, to be followed up by Dr. Barbaraann Cao. Risks of current treatment include potential inflammation and other side effects; benefits include continued disease control. Decision to continue current treatment based on well-managed condition and absence of concerning symptoms. - Continue  pemetrexed and pembrolizumab - Monitor 3mm left lung nodule with upcoming scans - Follow up on 3mm brain area with Dr. Barbaraann Cao  Follow-up - Schedule next follow-up per treatment protocol - Obtain lab sample if needed.  He will come back for follow-up visit in 3 weeks for evaluation before the next cycle of his treatment.  The patient was advised to call immediately if he has any other concerning symptoms in the interval. The  patient voices understanding of current disease status and treatment options and is in agreement with the current care plan.  All questions were answered. The patient knows to call the clinic with any problems, questions or concerns. We can certainly see the patient much sooner if necessary. The total time spent in the appointment was 30 minutes.  Disclaimer: This note was dictated with voice recognition software. Similar sounding words can inadvertently be transcribed and may not be corrected upon review.

## 2023-08-20 NOTE — Patient Instructions (Signed)
 [facility] - A DEPT OF [facility]  Discharge Instructions: Thank you for choosing [facility] to provide your oncology and hematology care.   If you have a lab appointment with the Cancer Center, please go directly to the Cancer Center and check in at the registration area.   Wear comfortable clothing and clothing appropriate for easy access to any Portacath or PICC line.   We strive to give you quality time with your provider. You may need to reschedule your appointment if you arrive late (15 or more minutes).  Arriving late affects you and other patients whose appointments are after yours.  Also, if you miss three or more appointments without notifying the office, you may be dismissed from the clinic at the provider's discretion.      For prescription refill requests, have your pharmacy contact our office and allow 72 hours for refills to be completed.    Today you received the following chemotherapy and/or immunotherapy agents Keytruda / Alimta      To help prevent nausea and vomiting after your treatment, we encourage you to take your nausea medication as directed.  BELOW ARE SYMPTOMS THAT SHOULD BE REPORTED IMMEDIATELY: *FEVER GREATER THAN 100.4 F (38 C) OR HIGHER *CHILLS OR SWEATING *NAUSEA AND VOMITING THAT IS NOT CONTROLLED WITH YOUR NAUSEA MEDICATION *UNUSUAL SHORTNESS OF BREATH *UNUSUAL BRUISING OR BLEEDING *URINARY PROBLEMS (pain or burning when urinating, or frequent urination) *BOWEL PROBLEMS (unusual diarrhea, constipation, pain near the anus) TENDERNESS IN MOUTH AND THROAT WITH OR WITHOUT PRESENCE OF ULCERS (sore throat, sores in mouth, or a toothache) UNUSUAL RASH, SWELLING OR PAIN  UNUSUAL VAGINAL DISCHARGE OR ITCHING   Items with * indicate a potential emergency and should be followed up as soon as possible or go to the Emergency Department if any problems should occur.  Please show the CHEMOTHERAPY ALERT CARD or  IMMUNOTHERAPY ALERT CARD at check-in to the Emergency Department and triage nurse.  Should you have questions after your visit or need to cancel or reschedule your appointment, please contact [facility] - A DEPT OF [facility]  Dept: [phone]  and follow the prompts.  Office hours are 8:00 a.m. to 4:30 p.m. Monday - Friday. Please note that voicemails left after 4:00 p.m. may not be returned until the following business day.  We are closed weekends and major holidays. You have access to a nurse at all times for urgent questions. Please call the main number to the clinic Dept: [phone] and follow the prompts.   For any non-urgent questions, you may also contact your provider using MyChart. We now offer e-Visits for anyone 98 and older to request care online for non-urgent symptoms. For details visit [email or web address].   Also download the MyChart app! Go to the app store, search "MyChart", open the app, select [facility], and log in with your MyChart username and password.

## 2023-08-22 ENCOUNTER — Telehealth: Payer: Self-pay | Admitting: Physician Assistant

## 2023-08-22 NOTE — Telephone Encounter (Signed)
Patient is aware of rescheduled appointment times/dates 

## 2023-09-02 ENCOUNTER — Other Ambulatory Visit (HOSPITAL_COMMUNITY): Payer: Self-pay

## 2023-09-02 ENCOUNTER — Other Ambulatory Visit: Payer: Self-pay

## 2023-09-02 ENCOUNTER — Other Ambulatory Visit: Payer: Self-pay | Admitting: "Endocrinology

## 2023-09-02 ENCOUNTER — Encounter: Payer: Self-pay | Admitting: Internal Medicine

## 2023-09-02 MED ORDER — BD LUER-LOK SYRINGE 21G X 1-1/2" 3 ML MISC
2 refills | Status: DC
  Filled 2023-09-02: qty 50, 180d supply, fill #0

## 2023-09-03 ENCOUNTER — Other Ambulatory Visit: Payer: Self-pay

## 2023-09-03 ENCOUNTER — Encounter: Payer: Self-pay | Admitting: Internal Medicine

## 2023-09-04 ENCOUNTER — Other Ambulatory Visit: Payer: Self-pay | Admitting: Family Medicine

## 2023-09-04 DIAGNOSIS — R630 Anorexia: Secondary | ICD-10-CM

## 2023-09-06 ENCOUNTER — Other Ambulatory Visit: Payer: Self-pay

## 2023-09-09 ENCOUNTER — Other Ambulatory Visit: Payer: Self-pay

## 2023-09-09 MED ORDER — FOLIC ACID 1 MG PO TABS
1.0000 mg | ORAL_TABLET | Freq: Every day | ORAL | 0 refills | Status: DC
  Filled 2023-09-09: qty 90, 90d supply, fill #0

## 2023-09-10 ENCOUNTER — Other Ambulatory Visit: Payer: Commercial Managed Care - PPO

## 2023-09-10 ENCOUNTER — Ambulatory Visit: Payer: Commercial Managed Care - PPO

## 2023-09-10 ENCOUNTER — Ambulatory Visit: Payer: Commercial Managed Care - PPO | Admitting: Physician Assistant

## 2023-09-13 ENCOUNTER — Other Ambulatory Visit (HOSPITAL_COMMUNITY)
Admission: RE | Admit: 2023-09-13 | Discharge: 2023-09-13 | Disposition: A | Discharge status: Home / Self Care | Payer: Commercial Managed Care - PPO | Source: Ambulatory Visit | Attending: Family Medicine | Admitting: Family Medicine

## 2023-09-15 NOTE — Progress Notes (Signed)
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    PD-L1 expression 80%.     Molecular Studies:  Biomarker Findings Microsatellite status - MS-Stable Tumor Mutational Burden - 6 Muts/Mb Genomic Findings For a complete list of the genes assayed, please refer to the Appendix. KRAS G12C, amplification ATM S470* CCND1 amplification - equivocal? HGF amplification - equivocal? MYC amplification - equivocal? FGF19 amplification - equivocal? FGF3 amplification - equivocal? FGF4 amplification - equivocal? NFKBIA amplification NKX2-1 amplification RAD21 amplification - equivocal? [id number]*26 TERT promoter -124C>T TP53 rearrangement exon 9 7 Disease relevant genes with no reportable alterations: ALK, BRAF, EGFR, ERBB2, MET, RET, ROS1   PRIOR THERAPY: SRS to the metastatic brain lesions under the care of Dr. Kathrynn Running.  Last treatment on 05/04/2021.   CURRENT THERAPY: Palliative systemic chemotherapy with carboplatin for an AUC 5, Alimta 500 mg/m2 and, Keytruda 200 mg IV every 3 weeks.  First dose expected on 05/08/2021.  Status post 39 cycles.  Starting from cycle #5 he is on maintenance treatment with Alimta and Keytruda every 3 weeks.  INTERVAL HISTORY Bradley Goodman returns for follow up and treatment as scheduled. Last seen by Dr. Arbutus Ped and Dr. Barbaraann Cao 08/20/23. He completed another cycle of Alimta/Keytruda.  He tolerates treatment very well, mainly with fatigue a few days after treatment then recovers.  He is  able to eat and drink.  Bowels moving normally, no nausea/vomiting.  Denies pain, cough, chest pain, dyspnea, rash, or any other new or specific complaints.  Has been on Dex 1 mg daily for a couple years to support his cortisol level which is acceptable dose on immunotherapy.  ROS  All other systems reviewed and negative  Past Medical History:  Diagnosis Date   Cervical spondylolysis    Essential hypertension    GERD (gastroesophageal reflux disease)    History of kidney stones    History of migraine    Hyperlipidemia    Hypertension    PONV (postoperative nausea and vomiting)    Type 2 diabetes mellitus (HCC)      Past Surgical History:  Procedure Laterality Date   Bilateral inguinal hernia repair     BRONCHIAL NEEDLE ASPIRATION BIOPSY  04/05/2021   Procedure: BRONCHIAL NEEDLE ASPIRATION BIOPSIES;  Surgeon: Josephine Igo, DO;  [facility];  Service: Pulmonary;;   COLONOSCOPY  01/23/2012   Procedure: COLONOSCOPY;  Surgeon: Corbin Ade, MD;  [facility];  Service: Endoscopy;  Laterality: N/A;  9:30 AM   COLONOSCOPY N/A 10/11/2015   Procedure: COLONOSCOPY;  Surgeon: Corbin Ade, MD;  [facility];  Service: Endoscopy;  Laterality: N/A;  830   COLONOSCOPY N/A 10/14/2019   Procedure: COLONOSCOPY;  Surgeon: Corbin Ade, MD;  [facility];  Service: Endoscopy;  Laterality: N/A;  1:45   POLYPECTOMY  10/14/2019   Procedure: POLYPECTOMY;  Surgeon: Corbin Ade, MD;  [facility];  Service: Endoscopy;;  ascending colon, descending colon   VIDEO BRONCHOSCOPY WITH ENDOBRONCHIAL ULTRASOUND N/A 04/05/2021   Procedure: VIDEO BRONCHOSCOPY WITH ENDOBRONCHIAL ULTRASOUND;  Surgeon: Josephine Igo, DO;  [facility];  Service: Pulmonary;  Laterality: N/A;     Outpatient Encounter Medications as of 09/16/2023  Medication Sig   cholecalciferol (VITAMIN D) 1000 UNITS tablet Take 1,000 Units by mouth every evening.   Continuous  Glucose Sensor (FREESTYLE LIBRE 3 SENSOR) MISC Place 1 sensor on the skin every 14 days. Use to check glucose continuously   dexamethasone (DECADRON) 1 MG tablet Take 1 tablet (1 mg total) by mouth daily with breakfast.   docusate sodium (COLACE) 100 MG capsule Take 100 mg by mouth daily.   folic acid (FOLVITE) 1 MG tablet Take 1 tablet (1 mg total) by mouth daily.   Krill Oil 300 MG CAPS Take 300 mg by mouth every evening.   levothyroxine (SYNTHROID) 88 MCG tablet Take 1 tablet (88 mcg total) by mouth daily before breakfast.   loratadine (CLARITIN) 10 MG tablet Take 10 mg by mouth every evening.    losartan (COZAAR) 50 MG tablet Take 1.5 tablets (75 mg total) by mouth daily.   mirtazapine (REMERON) 15 MG tablet Take 1 tablet (15 mg total) by mouth at bedtime.   Multiple Vitamins-Minerals (MULTIVITAMIN GUMMIES ADULT PO) Take 2 each by mouth daily.   pantoprazole (PROTONIX) 40 MG tablet Take 1 tablet (40 mg total) by mouth every evening.   PRESCRIPTION MEDICATION Testosterone injection   prochlorperazine (COMPAZINE) 10 MG tablet Take 1 tablet (10 mg total) by mouth every 6 (six) hours as needed for nausea or vomiting.   rosuvastatin (CRESTOR) 20 MG tablet Take 1 tablet (20 mg total) by mouth daily.   Semaglutide,0.25 or 0.5MG /DOS, (OZEMPIC, 0.25 OR 0.5 MG/DOSE,) 2 MG/3ML SOPN Inject 0.5 mg into the skin once a week.   SYRINGE-NEEDLE, DISP, 3 ML (B-D 3CC LUER-LOK SYR 21GX1-1/2) 21G X 1-1/2" 3 ML MISC Use to inject testosterone every week   testosterone cypionate (DEPOTESTOTERONE CYPIONATE) 100 MG/ML injection Take 50mg  ( 0.13ml) and 100mg  ( 1ml) into muscle alternatively weekly.   venlafaxine XR (EFFEXOR-XR) 75 MG 24 hr capsule Take 1 capsule (75 mg total) by mouth daily with breakfast.   No facility-administered encounter medications on file as of 09/16/2023.     Today's Vitals   09/16/23 0853 09/16/23 0854  BP:  136/79  Pulse:  95  Resp:  16  Temp:  98.3 F (36.8 C)  TempSrc:  Temporal   SpO2:  98%  Weight:  185 lb 11.2 oz (84.2 kg)  PainSc: 0-No pain    Body mass index is 25.9 kg/m.   PHYSICAL EXAM GENERAL:alert, no distress and comfortable SKIN: no rash  EYES: sclera clear NECK: without mass LYMPH:  no palpable cervical or supraclavicular lymphadenopathy  LUNGS: clear with normal breathing effort HEART: regular rate & rhythm, mild bilateral lower extremity edema ABDOMEN: abdomen soft, non-tender and normal bowel sounds NEURO: alert & oriented x 3 with fluent speech, no focal motor/sensory deficits   CBC    Component Value Date/Time   WBC 10.1 09/16/2023 0801   WBC 6.5 11/08/2022 0002   RBC 4.79 09/16/2023 0801   HGB 12.3 (L) 09/16/2023 0801   HGB 12.1 (L) 06/07/2023 0825   HCT 41.4 09/16/2023 0801   HCT 41.6 06/07/2023 0825   PLT 270 09/16/2023 0801   PLT 205 06/07/2023 0825   MCV 86.4 09/16/2023 0801   MCV 86 06/07/2023 0825   MCH 25.7 (L) 09/16/2023 0801   MCHC 29.7 (L) 09/16/2023 0801   RDW 19.1 (H) 09/16/2023 0801  RDW 18.2 (H) 06/07/2023 0825   LYMPHSABS 2.1 09/16/2023 0801   LYMPHSABS 2.6 06/07/2023 0825   MONOABS 0.7 09/16/2023 0801   EOSABS 0.1 09/16/2023 0801   EOSABS 0.2 06/07/2023 0825   BASOSABS 0.1 09/16/2023 0801   BASOSABS 0.1 06/07/2023 0825     CMP     Component Value Date/Time   NA 142 09/16/2023 0801   NA 145 (H) 06/07/2023 0825   K 3.9 09/16/2023 0801   CL 105 09/16/2023 0801   CO2 29 09/16/2023 0801   GLUCOSE 85 09/16/2023 0801   BUN 13 09/16/2023 0801   BUN 19 06/07/2023 0825   CREATININE 1.11 09/16/2023 0801   CALCIUM 9.4 09/16/2023 0801   PROT 6.8 09/16/2023 0801   PROT 6.2 06/07/2023 0825   ALBUMIN 3.8 09/16/2023 0801   ALBUMIN 3.9 06/07/2023 0825   AST 19 09/16/2023 0801   ALT 16 09/16/2023 0801   ALKPHOS 53 09/16/2023 0801   BILITOT 0.3 09/16/2023 0801   GFRNONAA >60 09/16/2023 0801   GFRAA 82 07/14/2020 0945     ASSESSMENT & PLAN: 67 yo male with   Stage IV Non-Small Cell Lung Cancer (NSCLC)  Adenocarcinoma Diagnosed June 2022, stage IV NSCLC adenocarcinoma, with nodal, bone, and brain metastasis  -molecular studies show  18% PD-L1 expression, KRAS G12C mutation -Initially treated with 4 cycles of carboplatin, pemetrexed, and pembrolizumab, followed by maintenance pemetrexed and pembrolizumab q 21 days. Currently s/o 40th cycle.  -Recent CT 11/7 shows new 3mm left lung nodule, likely inflammatory, otherwise stable.  Scans every 3 months; and Brain MRI shows 3mm area of concern, to be followed up by Dr. Barbaraann Cao. -Mr. Golz appears stable.  S/p cycle 40 maintenance Alimta/Keytruda, tolerating very well with fatigue otherwise no significant toxicities.  He is able to recover and function and maintain adequate performance status.  There is no clinical evidence of disease progression -Labs reviewed, adequate to proceed with cycle 41 today as scheduled   PLAN: -Labs reviewed -Proceed with cycle 41 Alimta/Keytruda today as scheduled, no dose adjustments -F/up and next cycle in 3 weeks    All questions were answered. The patient knows to call the clinic with any problems, questions or concerns. No barriers to learning were detected.   Santiago Glad, NP-C 09/16/2023

## 2023-09-16 ENCOUNTER — Other Ambulatory Visit: Payer: Self-pay

## 2023-09-16 ENCOUNTER — Inpatient Hospital Stay: Payer: Commercial Managed Care - PPO | Admitting: Nurse Practitioner

## 2023-09-16 ENCOUNTER — Inpatient Hospital Stay: Payer: Commercial Managed Care - PPO | Attending: Physician Assistant

## 2023-09-16 ENCOUNTER — Inpatient Hospital Stay: Payer: Commercial Managed Care - PPO

## 2023-09-16 ENCOUNTER — Encounter: Payer: Self-pay | Admitting: Nurse Practitioner

## 2023-09-16 VITALS — BP 136/79 | HR 95 | Temp 98.3°F | Resp 16 | Wt 185.7 lb

## 2023-09-16 DIAGNOSIS — Z79620 Long term (current) use of immunosuppressive biologic: Secondary | ICD-10-CM | POA: Diagnosis not present

## 2023-09-16 DIAGNOSIS — C3491 Malignant neoplasm of unspecified part of right bronchus or lung: Secondary | ICD-10-CM | POA: Diagnosis not present

## 2023-09-16 DIAGNOSIS — C7931 Secondary malignant neoplasm of brain: Secondary | ICD-10-CM | POA: Insufficient documentation

## 2023-09-16 DIAGNOSIS — E538 Deficiency of other specified B group vitamins: Secondary | ICD-10-CM | POA: Insufficient documentation

## 2023-09-16 DIAGNOSIS — C3411 Malignant neoplasm of upper lobe, right bronchus or lung: Secondary | ICD-10-CM | POA: Diagnosis not present

## 2023-09-16 DIAGNOSIS — Z5111 Encounter for antineoplastic chemotherapy: Secondary | ICD-10-CM | POA: Diagnosis not present

## 2023-09-16 DIAGNOSIS — R5383 Other fatigue: Secondary | ICD-10-CM | POA: Insufficient documentation

## 2023-09-16 DIAGNOSIS — Z79899 Other long term (current) drug therapy: Secondary | ICD-10-CM | POA: Insufficient documentation

## 2023-09-16 DIAGNOSIS — C7951 Secondary malignant neoplasm of bone: Secondary | ICD-10-CM | POA: Insufficient documentation

## 2023-09-16 DIAGNOSIS — Z5112 Encounter for antineoplastic immunotherapy: Secondary | ICD-10-CM | POA: Diagnosis not present

## 2023-09-16 LAB — CMP (CANCER CENTER ONLY)
ALT: 16 U/L (ref 0–44)
AST: 19 U/L (ref 15–41)
Albumin: 3.8 g/dL (ref 3.5–5.0)
Alkaline Phosphatase: 53 U/L (ref 38–126)
Anion gap: 8 (ref 5–15)
BUN: 13 mg/dL (ref 8–23)
CO2: 29 mmol/L (ref 22–32)
Calcium: 9.4 mg/dL (ref 8.9–10.3)
Chloride: 105 mmol/L (ref 98–111)
Creatinine: 1.11 mg/dL (ref 0.61–1.24)
GFR, Estimated: >60 mL/min (ref >60)
Glucose, Bld: 85 mg/dL (ref 70–99)
Potassium: 3.9 mmol/L (ref 3.5–5.1)
Sodium: 142 mmol/L (ref 135–145)
Total Bilirubin: 0.3 mg/dL (ref <1.2)
Total Protein: 6.8 g/dL (ref 6.5–8.1)

## 2023-09-16 LAB — CBC WITH DIFFERENTIAL (CANCER CENTER ONLY)
Abs Immature Granulocytes: 0.05 10*3/uL (ref 0.00–0.07)
Basophils Absolute: 0.1 10*3/uL (ref 0.0–0.1)
Basophils Relative: 1 %
Eosinophils Absolute: 0.1 10*3/uL (ref 0.0–0.5)
Eosinophils Relative: 1 %
HCT: 41.4 % (ref 39.0–52.0)
Hemoglobin: 12.3 g/dL — ABNORMAL LOW (ref 13.0–17.0)
Immature Granulocytes: 1 %
Lymphocytes Relative: 21 %
Lymphs Abs: 2.1 10*3/uL (ref 0.7–4.0)
MCH: 25.7 pg — ABNORMAL LOW (ref 26.0–34.0)
MCHC: 29.7 g/dL — ABNORMAL LOW (ref 30.0–36.0)
MCV: 86.4 fL (ref 80.0–100.0)
Monocytes Absolute: 0.7 10*3/uL (ref 0.1–1.0)
Monocytes Relative: 7 %
Neutro Abs: 7.1 10*3/uL (ref 1.7–7.7)
Neutrophils Relative %: 69 %
Platelet Count: 270 10*3/uL (ref 150–400)
RBC: 4.79 MIL/uL (ref 4.22–5.81)
RDW: 19.1 % — ABNORMAL HIGH (ref 11.5–15.5)
WBC Count: 10.1 10*3/uL (ref 4.0–10.5)
nRBC: 0 % (ref 0.0–0.2)

## 2023-09-16 MED ORDER — PEMETREXED DISODIUM CHEMO INJECTION 500 MG
500.0000 mg/m2 | Freq: Once | INTRAVENOUS | Status: AC
  Administered 2023-09-16: 1000 mg via INTRAVENOUS
  Filled 2023-09-16: qty 40

## 2023-09-16 MED ORDER — PROCHLORPERAZINE MALEATE 10 MG PO TABS
10.0000 mg | ORAL_TABLET | Freq: Once | ORAL | Status: AC
Start: 2023-09-16 — End: 2023-09-16
  Administered 2023-09-16: 10 mg via ORAL
  Filled 2023-09-16: qty 1

## 2023-09-16 MED ORDER — SODIUM CHLORIDE 0.9 % IV SOLN
200.0000 mg | Freq: Once | INTRAVENOUS | Status: AC
  Administered 2023-09-16: 200 mg via INTRAVENOUS
  Filled 2023-09-16: qty 200

## 2023-09-16 MED ORDER — CYANOCOBALAMIN 1000 MCG/ML IJ SOLN
1000.0000 ug | Freq: Once | INTRAMUSCULAR | Status: AC
  Administered 2023-09-16: 1000 ug via INTRAMUSCULAR
  Filled 2023-09-16: qty 1

## 2023-09-16 MED ORDER — SODIUM CHLORIDE 0.9 % IV SOLN: Freq: Once | INTRAVENOUS | Status: AC

## 2023-09-16 NOTE — Patient Instructions (Signed)
 [facility] - A DEPT OF [facility]  Discharge Instructions: Thank you for choosing [facility] to provide your oncology and hematology care.   If you have a lab appointment with the Cancer Center, please go directly to the Cancer Center and check in at the registration area.   Wear comfortable clothing and clothing appropriate for easy access to any Portacath or PICC line.   We strive to give you quality time with your provider. You may need to reschedule your appointment if you arrive late (15 or more minutes).  Arriving late affects you and other patients whose appointments are after yours.  Also, if you miss three or more appointments without notifying the office, you may be dismissed from the clinic at the provider's discretion.      For prescription refill requests, have your pharmacy contact our office and allow 72 hours for refills to be completed.    Today you received the following chemotherapy and/or immunotherapy agents: Keytruda, Alimta      To help prevent nausea and vomiting after your treatment, we encourage you to take your nausea medication as directed.  BELOW ARE SYMPTOMS THAT SHOULD BE REPORTED IMMEDIATELY: *FEVER GREATER THAN 100.4 F (38 C) OR HIGHER *CHILLS OR SWEATING *NAUSEA AND VOMITING THAT IS NOT CONTROLLED WITH YOUR NAUSEA MEDICATION *UNUSUAL SHORTNESS OF BREATH *UNUSUAL BRUISING OR BLEEDING *URINARY PROBLEMS (pain or burning when urinating, or frequent urination) *BOWEL PROBLEMS (unusual diarrhea, constipation, pain near the anus) TENDERNESS IN MOUTH AND THROAT WITH OR WITHOUT PRESENCE OF ULCERS (sore throat, sores in mouth, or a toothache) UNUSUAL RASH, SWELLING OR PAIN  UNUSUAL VAGINAL DISCHARGE OR ITCHING   Items with * indicate a potential emergency and should be followed up as soon as possible or go to the Emergency Department if any problems should occur.  Please show the CHEMOTHERAPY ALERT CARD or  IMMUNOTHERAPY ALERT CARD at check-in to the Emergency Department and triage nurse.  Should you have questions after your visit or need to cancel or reschedule your appointment, please contact [facility] - A DEPT OF [facility]  Dept: [phone]  and follow the prompts.  Office hours are 8:00 a.m. to 4:30 p.m. Monday - Friday. Please note that voicemails left after 4:00 p.m. may not be returned until the following business day.  We are closed weekends and major holidays. You have access to a nurse at all times for urgent questions. Please call the main number to the clinic Dept: [phone] and follow the prompts.   For any non-urgent questions, you may also contact your provider using MyChart. We now offer e-Visits for anyone 79 and older to request care online for non-urgent symptoms. For details visit [email or web address].   Also download the MyChart app! Go to the app store, search "MyChart", open the app, select [facility], and log in with your MyChart username and password.

## 2023-09-16 NOTE — Addendum Note (Signed)
Addended by: Pollyann Samples on: 09/16/2023 10:03 AM   Modules accepted: Orders

## 2023-09-18 LAB — QUANTIFERON-TB GOLD PLUS (RQFGPL)
QuantiFERON Mitogen Value: 4.47 [IU]/mL
QuantiFERON Nil Value: 0 [IU]/mL
QuantiFERON TB1 Ag Value: 0.04 [IU]/mL
QuantiFERON TB2 Ag Value: 0.01 [IU]/mL

## 2023-09-18 LAB — QUANTIFERON-TB GOLD PLUS: QuantiFERON-TB Gold Plus: NEGATIVE

## 2023-09-28 NOTE — Progress Notes (Signed)
 [facility] OFFICE PROGRESS NOTE  Bradley [name], FNP [address] [place] [address]  DIAGNOSIS: Stage IV (T1b, N3, M1C) non-small cell lung cancer, favoring adenocarcinoma presented with right upper lobe lung nodule in addition to right hilar, subcarinal and bilateral mediastinal as well as supraclavicular lymphadenopathy in addition to bone and brain metastasis diagnosed in June 2022.   PD-L1 expression 80%.     Molecular Studies:  Biomarker Findings Microsatellite status - MS-Stable Tumor Mutational Burden - 6 Muts/Mb Genomic Findings For a complete list of the genes assayed, please refer to the Appendix. KRAS G12C, amplification ATM S470* CCND1 amplification - equivocal? HGF amplification - equivocal? MYC amplification - equivocal? FGF19 amplification - equivocal? FGF3 amplification - equivocal? FGF4 amplification - equivocal? NFKBIA amplification NKX2-1 amplification RAD21 amplification - equivocal? [id number]*26 TERT promoter -124C>T TP53 rearrangement exon 9 7 Disease relevant genes with no reportable alterations: ALK, BRAF, EGFR, ERBB2, MET, RET, ROS1   PRIOR THERAPY: SRS to the metastatic brain lesions under the care of Dr. Patrcia.  Last treatment on 05/04/2021.   CURRENT THERAPY: Palliative systemic chemotherapy with carboplatin  for an AUC 5, Alimta  500 mg/m2 and, Keytruda  200 mg IV every 3 weeks.  First dose expected on 05/08/2021.  Status post 41 cycles.  Starting from cycle #5 he is on maintenance treatment with Alimta  and Keytruda  every 3 weeks.    INTERVAL HISTORY: Bradley Goodman 67 y.o. male returns to the clinic today for a follow-up visit accompanied by his wife. The patient was last seen by my colleague 3 weeks ago.  He is currently on treatment with Alimta  and Keytruda . He tolerates well except for some mild fatigue after treatment. Today, he denies any fever, chills, or night sweats. Denies any dyspnea, cough, chest pain, or  hemoptysis.  Denies any nausea, vomiting, diarrhea, or constipation. He denies unusual headaches.  Denies rashes and skin changes except for easy bruising. He is here today for evaluation repeat blood work before undergoing cycle #42    MEDICAL HISTORY: Past Medical History:  Diagnosis Date   Cervical spondylolysis    Essential hypertension    GERD (gastroesophageal reflux disease)    History of kidney stones    History of migraine    Hyperlipidemia    Hypertension    PONV (postoperative nausea and vomiting)    Type 2 diabetes mellitus (HCC)     ALLERGIES:  has no known allergies.  MEDICATIONS:  Current Outpatient Medications  Medication Sig Dispense Refill   cholecalciferol  (VITAMIN D ) 1000 UNITS tablet Take 1,000 Units by mouth every evening.     Continuous Glucose Sensor (FREESTYLE LIBRE 3 SENSOR) MISC Place 1 sensor on the skin every 14 days. Use to check glucose continuously 6 each 3   dexamethasone  (DECADRON ) 1 MG tablet Take 1 tablet (1 mg total) by mouth daily with breakfast. 90 tablet 1   docusate sodium  (COLACE) 100 MG capsule Take 100 mg by mouth daily.     folic acid  (FOLVITE ) 1 MG tablet Take 1 tablet (1 mg total) by mouth daily. 90 tablet 0   Krill Oil 300 MG CAPS Take 300 mg by mouth every evening.     levothyroxine  (SYNTHROID ) 88 MCG tablet Take 1 tablet (88 mcg total) by mouth daily before breakfast. 90 tablet 1   loratadine  (CLARITIN ) 10 MG tablet Take 10 mg by mouth every evening.      losartan  (COZAAR ) 50 MG tablet Take 1.5 tablets (75 mg total) by mouth daily.  135 tablet 1   mirtazapine  (REMERON ) 15 MG tablet Take 1 tablet (15 mg total) by mouth at bedtime. 90 tablet 1   Multiple Vitamins-Minerals (MULTIVITAMIN GUMMIES ADULT PO) Take 2 each by mouth daily.     pantoprazole  (PROTONIX ) 40 MG tablet Take 1 tablet (40 mg total) by mouth every evening. 90 tablet 1   PRESCRIPTION MEDICATION Testosterone  injection     prochlorperazine  (COMPAZINE ) 10 MG tablet Take 1  tablet (10 mg total) by mouth every 6 (six) hours as needed for nausea or vomiting. 30 tablet 0   rosuvastatin  (CRESTOR ) 20 MG tablet Take 1 tablet (20 mg total) by mouth daily. 90 tablet 0   Semaglutide ,0.25 or 0.5MG /DOS, (OZEMPIC , 0.25 OR 0.5 MG/DOSE,) 2 MG/3ML SOPN Inject 0.5 mg into the skin once a week. 9 mL 0   SYRINGE-NEEDLE, DISP, 3 ML (B-D 3CC LUER-LOK SYR 21GX1-1/2) 21G X 1-1/2 3 ML MISC Use to inject testosterone  every week 100 each 2   testosterone  cypionate (DEPOTESTOTERONE CYPIONATE) 100 MG/ML injection Take 50mg  ( 0.65ml) and 100mg  ( 1ml) into muscle alternatively weekly. 10 mL 1   venlafaxine  XR (EFFEXOR -XR) 75 MG 24 hr capsule Take 1 capsule (75 mg total) by mouth daily with breakfast. 90 capsule 0   No current facility-administered medications for this visit.    SURGICAL HISTORY:  Past Surgical History:  Procedure Laterality Date   Bilateral inguinal hernia repair     BRONCHIAL NEEDLE ASPIRATION BIOPSY  04/05/2021   Procedure: BRONCHIAL NEEDLE ASPIRATION BIOPSIES;  Surgeon: Brenna Adine CROME, DO;  [facility];  Service: Pulmonary;;   COLONOSCOPY  01/23/2012   Procedure: COLONOSCOPY;  Surgeon: Lamar CHRISTELLA Hollingshead, MD;  [facility];  Service: Endoscopy;  Laterality: N/A;  9:30 AM   COLONOSCOPY N/A 10/11/2015   Procedure: COLONOSCOPY;  Surgeon: Lamar CHRISTELLA Hollingshead, MD;  [facility];  Service: Endoscopy;  Laterality: N/A;  830   COLONOSCOPY N/A 10/14/2019   Procedure: COLONOSCOPY;  Surgeon: Hollingshead Lamar CHRISTELLA, MD;  [facility];  Service: Endoscopy;  Laterality: N/A;  1:45   POLYPECTOMY  10/14/2019   Procedure: POLYPECTOMY;  Surgeon: Hollingshead Lamar CHRISTELLA, MD;  [facility];  Service: Endoscopy;;  ascending colon, descending colon   VIDEO BRONCHOSCOPY WITH ENDOBRONCHIAL ULTRASOUND N/A 04/05/2021   Procedure: VIDEO BRONCHOSCOPY WITH ENDOBRONCHIAL ULTRASOUND;  Surgeon: Brenna Adine CROME, DO;  [facility];  Service: Pulmonary;  Laterality:  N/A;    REVIEW OF SYSTEMS:   Constitutional: Positive for fatigue. Negative for appetite change, chills, fever and unexpected weight change.  HENT: Poor dentition. Negative for mouth sores, nosebleeds, sore throat and trouble swallowing.   Eyes: Negative for eye problems and icterus.  Respiratory: Negative for cough, hemoptysis, shortness of breath and wheezing.   Cardiovascular: Negative for chest pain or swelling. Gastrointestinal: Negative for abdominal pain, constipation, diarrhea, nausea and vomiting.  Genitourinary: Negative for bladder incontinence, difficulty urinating, dysuria, frequency and hematuria.   Musculoskeletal: Negative for back pain, gait problem, neck pain and neck stiffness.  Skin: Negative for itching and rash.  Neurological: Negative for dizziness, extremity weakness, gait problem, headaches, light-headedness and seizures.  Hematological: Negative for adenopathy. Does not bruise/bleed easily.  Psychiatric/Behavioral: Negative for confusion, depression and sleep disturbance. The patient is not nervous/anxious   PHYSICAL EXAMINATION:  There were no vitals taken for this visit.  ECOG PERFORMANCE STATUS: 1  Physical Exam  Constitutional: Oriented to person, place, and time and well-developed, well-nourished, and in no distress.  HENT:  Head: Normocephalic and atraumatic.  Mouth/Throat: Positive for poor dentition. Oropharynx is clear and moist. No oropharyngeal exudate.  Eyes: Conjunctivae are normal. Right eye exhibits no discharge. Left eye exhibits no discharge. No scleral icterus.  Neck: Normal range of motion. Neck supple.  Cardiovascular: Normal rate, regular rhythm, normal heart sounds and intact distal pulses.   Pulmonary/Chest: Effort normal and breath sounds normal. No respiratory distress. No wheezes. No rales.  Abdominal: Soft. Bowel sounds are normal. Exhibits no distension and no mass. There is no tenderness.  Musculoskeletal: Normal range of motion.  No swelling.  Lymphadenopathy:    No cervical adenopathy.  Neurological: Alert and oriented to person, place, and time. Exhibits normal muscle tone. Gait normal. Coordination normal.  Skin: Positive for upper extremity bruising (L>R). Skin is warm and dry. No rash noted. Not diaphoretic. No erythema. No pallor.  Psychiatric: Mood, memory and judgment normal.  Vitals reviewed.  LABORATORY DATA: Lab Results  Component Value Date   WBC 10.1 09/16/2023   HGB 12.3 (L) 09/16/2023   HCT 41.4 09/16/2023   MCV 86.4 09/16/2023   PLT 270 09/16/2023      Chemistry      Component Value Date/Time   NA 142 09/16/2023 0801   NA 145 (H) 06/07/2023 0825   K 3.9 09/16/2023 0801   CL 105 09/16/2023 0801   CO2 29 09/16/2023 0801   BUN 13 09/16/2023 0801   BUN 19 06/07/2023 0825   CREATININE 1.11 09/16/2023 0801      Component Value Date/Time   CALCIUM  9.4 09/16/2023 0801   ALKPHOS 53 09/16/2023 0801   AST 19 09/16/2023 0801   ALT 16 09/16/2023 0801   BILITOT 0.3 09/16/2023 0801       RADIOGRAPHIC STUDIES:  No results found.   ASSESSMENT/PLAN:  This is a very pleasant 67 year old Caucasian male diagnosed with stage IV (T1b, N3, M1 C) non-small cell lung cancer, favoring adenocarcinoma.  The patient presented with a right upper lobe lung nodule in addition to right hilar, subcarinal, and bilateral mediastinal lymphadenopathy as well as supraclavicular lymphadenopathy.  The patient also has metastatic disease to the brain.  He was diagnosed in June 2022.  His PD-L1 expression is 80%.  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    The patient completed SRS to the brain lesions under the care of Dr. Patrcia.  His last treatment was on 05/04/2021. 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     The patient is currently undergoing systemic chemotherapy with  carboplatin  for an AUC of 5, Alimta  500 mg per metered squared, Keytruda  200 mg IV every 3 weeks.  Status post 41 cycles. Starting from cycle #5, the patient started maintenance Alimta  and Keytruda  IV every 3 weeks.     Labs were reviewed.  Recommend that he proceed with cycle #42 today as scheduled.   We will see him back for follow-up visit in 3 weeks for evaluation repeat blood work before undergoing cycle #43.   The patient was advised to call immediately if he has any concerning symptoms in the interval. The patient voices understanding of current disease status and treatment options and is in agreement with the current care plan. All questions were answered. The patient knows to call the clinic with any problems, questions or concerns. We can certainly see the patient much sooner if  necessary     No orders of the defined types were placed in this encounter.    The total time spent in the appointment was 20-29 minutes  Teshara Moree L Anastasia Tompson, PA-C 09/28/23

## 2023-10-02 ENCOUNTER — Other Ambulatory Visit (HOSPITAL_COMMUNITY): Payer: Self-pay

## 2023-10-03 ENCOUNTER — Other Ambulatory Visit: Payer: Self-pay

## 2023-10-08 ENCOUNTER — Inpatient Hospital Stay: Payer: Commercial Managed Care - PPO

## 2023-10-08 ENCOUNTER — Inpatient Hospital Stay: Payer: Commercial Managed Care - PPO | Admitting: Physician Assistant

## 2023-10-08 VITALS — BP 136/91 | HR 99 | Temp 97.9°F | Resp 16 | Ht 71.0 in | Wt 186.4 lb

## 2023-10-08 DIAGNOSIS — Z5112 Encounter for antineoplastic immunotherapy: Secondary | ICD-10-CM | POA: Diagnosis not present

## 2023-10-08 DIAGNOSIS — E538 Deficiency of other specified B group vitamins: Secondary | ICD-10-CM | POA: Diagnosis not present

## 2023-10-08 DIAGNOSIS — C7951 Secondary malignant neoplasm of bone: Secondary | ICD-10-CM | POA: Diagnosis not present

## 2023-10-08 DIAGNOSIS — C3411 Malignant neoplasm of upper lobe, right bronchus or lung: Secondary | ICD-10-CM | POA: Diagnosis not present

## 2023-10-08 DIAGNOSIS — C7931 Secondary malignant neoplasm of brain: Secondary | ICD-10-CM | POA: Diagnosis not present

## 2023-10-08 DIAGNOSIS — C3491 Malignant neoplasm of unspecified part of right bronchus or lung: Secondary | ICD-10-CM

## 2023-10-08 DIAGNOSIS — Z5111 Encounter for antineoplastic chemotherapy: Secondary | ICD-10-CM

## 2023-10-08 DIAGNOSIS — Z79620 Long term (current) use of immunosuppressive biologic: Secondary | ICD-10-CM | POA: Diagnosis not present

## 2023-10-08 DIAGNOSIS — R5383 Other fatigue: Secondary | ICD-10-CM | POA: Diagnosis not present

## 2023-10-08 DIAGNOSIS — Z79899 Other long term (current) drug therapy: Secondary | ICD-10-CM | POA: Diagnosis not present

## 2023-10-08 LAB — CMP (CANCER CENTER ONLY)
ALT: 19 U/L (ref 0–44)
AST: 19 U/L (ref 15–41)
Albumin: 3.9 g/dL (ref 3.5–5.0)
Alkaline Phosphatase: 65 U/L (ref 38–126)
Anion gap: 7 (ref 5–15)
BUN: 12 mg/dL (ref 8–23)
CO2: 27 mmol/L (ref 22–32)
Calcium: 9 mg/dL (ref 8.9–10.3)
Chloride: 108 mmol/L (ref 98–111)
Creatinine: 1.01 mg/dL (ref 0.61–1.24)
GFR, Estimated: >60 mL/min (ref >60)
Glucose, Bld: 86 mg/dL (ref 70–99)
Potassium: 3.6 mmol/L (ref 3.5–5.1)
Sodium: 142 mmol/L (ref 135–145)
Total Bilirubin: 0.2 mg/dL (ref 0.0–1.2)
Total Protein: 6.8 g/dL (ref 6.5–8.1)

## 2023-10-08 LAB — CBC WITH DIFFERENTIAL (CANCER CENTER ONLY)
Abs Immature Granulocytes: 0.05 10*3/uL (ref 0.00–0.07)
Basophils Absolute: 0.1 10*3/uL (ref 0.0–0.1)
Basophils Relative: 1 %
Eosinophils Absolute: 0.1 10*3/uL (ref 0.0–0.5)
Eosinophils Relative: 1 %
HCT: 42.6 % (ref 39.0–52.0)
Hemoglobin: 12.9 g/dL — ABNORMAL LOW (ref 13.0–17.0)
Immature Granulocytes: 1 %
Lymphocytes Relative: 17 %
Lymphs Abs: 1.7 10*3/uL (ref 0.7–4.0)
MCH: 25.6 pg — ABNORMAL LOW (ref 26.0–34.0)
MCHC: 30.3 g/dL (ref 30.0–36.0)
MCV: 84.5 fL (ref 80.0–100.0)
Monocytes Absolute: 0.7 10*3/uL (ref 0.1–1.0)
Monocytes Relative: 6 %
Neutro Abs: 7.7 10*3/uL (ref 1.7–7.7)
Neutrophils Relative %: 74 %
Platelet Count: 327 10*3/uL (ref 150–400)
RBC: 5.04 MIL/uL (ref 4.22–5.81)
RDW: 19.2 % — ABNORMAL HIGH (ref 11.5–15.5)
WBC Count: 10.4 10*3/uL (ref 4.0–10.5)
nRBC: 0 % (ref 0.0–0.2)

## 2023-10-08 LAB — TSH: TSH: 0.133 u[IU]/mL — ABNORMAL LOW (ref 0.350–4.500)

## 2023-10-08 MED ORDER — SODIUM CHLORIDE 0.9 % IV SOLN
200.0000 mg | Freq: Once | INTRAVENOUS | Status: AC
  Administered 2023-10-08: 200 mg via INTRAVENOUS
  Filled 2023-10-08: qty 200

## 2023-10-08 MED ORDER — SODIUM CHLORIDE 0.9 % IV SOLN: Freq: Once | INTRAVENOUS | Status: AC

## 2023-10-08 MED ORDER — PROCHLORPERAZINE MALEATE 10 MG PO TABS
10.0000 mg | ORAL_TABLET | Freq: Once | ORAL | Status: AC
Start: 2023-10-08 — End: 2023-10-08
  Administered 2023-10-08: 10 mg via ORAL
  Filled 2023-10-08: qty 1

## 2023-10-08 MED ORDER — CYANOCOBALAMIN 1000 MCG/ML IJ SOLN
1000.0000 ug | Freq: Once | INTRAMUSCULAR | Status: AC
  Administered 2023-10-08: 1000 ug via INTRAMUSCULAR
  Filled 2023-10-08: qty 1

## 2023-10-08 MED ORDER — SODIUM CHLORIDE 0.9 % IV SOLN
500.0000 mg/m2 | Freq: Once | INTRAVENOUS | Status: AC
  Administered 2023-10-08: 1000 mg via INTRAVENOUS
  Filled 2023-10-08: qty 40

## 2023-10-08 NOTE — Patient Instructions (Signed)
 [facility] - A DEPT OF [facility]  Discharge Instructions: Thank you for choosing [facility] to provide your oncology and hematology care.   If you have a lab appointment with the Cancer Center, please go directly to the Cancer Center and check in at the registration area.   Wear comfortable clothing and clothing appropriate for easy access to any Portacath or PICC line.   We strive to give you quality time with your provider. You may need to reschedule your appointment if you arrive late (15 or more minutes).  Arriving late affects you and other patients whose appointments are after yours.  Also, if you miss three or more appointments without notifying the office, you may be dismissed from the clinic at the provider's discretion.      For prescription refill requests, have your pharmacy contact our office and allow 72 hours for refills to be completed.    Today you received the following chemotherapy and/or immunotherapy agents: Keytruda, Alimta.       To help prevent nausea and vomiting after your treatment, we encourage you to take your nausea medication as directed.  BELOW ARE SYMPTOMS THAT SHOULD BE REPORTED IMMEDIATELY: *FEVER GREATER THAN 100.4 F (38 C) OR HIGHER *CHILLS OR SWEATING *NAUSEA AND VOMITING THAT IS NOT CONTROLLED WITH YOUR NAUSEA MEDICATION *UNUSUAL SHORTNESS OF BREATH *UNUSUAL BRUISING OR BLEEDING *URINARY PROBLEMS (pain or burning when urinating, or frequent urination) *BOWEL PROBLEMS (unusual diarrhea, constipation, pain near the anus) TENDERNESS IN MOUTH AND THROAT WITH OR WITHOUT PRESENCE OF ULCERS (sore throat, sores in mouth, or a toothache) UNUSUAL RASH, SWELLING OR PAIN  UNUSUAL VAGINAL DISCHARGE OR ITCHING   Items with * indicate a potential emergency and should be followed up as soon as possible or go to the Emergency Department if any problems should occur.  Please show the CHEMOTHERAPY ALERT CARD or  IMMUNOTHERAPY ALERT CARD at check-in to the Emergency Department and triage nurse.  Should you have questions after your visit or need to cancel or reschedule your appointment, please contact [facility] - A DE[facility]  Dept: [phone]  and follow the prompts.  Office hours are 8:00 a.m. to 4:30 p.m. Monday - Friday. Please note that voicemails left after 4:00 p.m. may not be returned until the following business day.  We are closed weekends and major holidays. You have access to a nurse at all times for urgent questions. Please call the main number to the clinic Dept: [phone] and follow the prompts.   For any non-urgent questions, you may also contact your provider using MyChart. We now offer e-Visits for anyone 61 and older to request care online for non-urgent symptoms. For details visit [email or web address].   Also download the MyChart app! Go to the app store, search "MyChart", open the app, select , and log in with your MyChart username and password.

## 2023-10-11 ENCOUNTER — Other Ambulatory Visit: Payer: Self-pay | Admitting: Physician Assistant

## 2023-10-11 DIAGNOSIS — E079 Disorder of thyroid, unspecified: Secondary | ICD-10-CM

## 2023-10-15 ENCOUNTER — Ambulatory Visit: Payer: Commercial Managed Care - PPO | Admitting: Family Medicine

## 2023-10-15 ENCOUNTER — Encounter: Payer: Self-pay | Admitting: *Deleted

## 2023-10-24 ENCOUNTER — Other Ambulatory Visit: Payer: Self-pay

## 2023-10-24 ENCOUNTER — Encounter: Payer: Self-pay | Admitting: Family Medicine

## 2023-10-24 ENCOUNTER — Other Ambulatory Visit (HOSPITAL_COMMUNITY): Payer: Self-pay

## 2023-10-24 ENCOUNTER — Ambulatory Visit: Payer: Commercial Managed Care - PPO | Admitting: Family Medicine

## 2023-10-24 VITALS — BP 109/79 | HR 95 | Temp 97.6°F | Ht 71.0 in | Wt 182.8 lb

## 2023-10-24 DIAGNOSIS — E1169 Type 2 diabetes mellitus with other specified complication: Secondary | ICD-10-CM

## 2023-10-24 DIAGNOSIS — E1159 Type 2 diabetes mellitus with other circulatory complications: Secondary | ICD-10-CM

## 2023-10-24 DIAGNOSIS — E1165 Type 2 diabetes mellitus with hyperglycemia: Secondary | ICD-10-CM

## 2023-10-24 DIAGNOSIS — K219 Gastro-esophageal reflux disease without esophagitis: Secondary | ICD-10-CM | POA: Diagnosis not present

## 2023-10-24 DIAGNOSIS — Z7985 Long-term (current) use of injectable non-insulin antidiabetic drugs: Secondary | ICD-10-CM | POA: Diagnosis not present

## 2023-10-24 DIAGNOSIS — E785 Hyperlipidemia, unspecified: Secondary | ICD-10-CM | POA: Diagnosis not present

## 2023-10-24 DIAGNOSIS — F3340 Major depressive disorder, recurrent, in remission, unspecified: Secondary | ICD-10-CM

## 2023-10-24 LAB — BAYER DCA HB A1C WAIVED: HB A1C (BAYER DCA - WAIVED): 5.5 % (ref 4.8–5.6)

## 2023-10-24 MED ORDER — ROSUVASTATIN CALCIUM 20 MG PO TABS
20.0000 mg | ORAL_TABLET | Freq: Every day | ORAL | 1 refills | Status: DC
  Filled 2023-10-24: qty 90, 90d supply, fill #0
  Filled 2024-01-19: qty 90, 90d supply, fill #1

## 2023-10-24 MED ORDER — PANTOPRAZOLE SODIUM 40 MG PO TBEC
40.0000 mg | DELAYED_RELEASE_TABLET | Freq: Every evening | ORAL | 1 refills | Status: DC
  Filled 2023-10-24: qty 90, 90d supply, fill #0
  Filled 2024-01-19: qty 90, 90d supply, fill #1

## 2023-10-24 MED ORDER — VENLAFAXINE HCL ER 75 MG PO CP24
75.0000 mg | ORAL_CAPSULE | Freq: Every day | ORAL | 1 refills | Status: DC
  Filled 2023-10-24: qty 90, 90d supply, fill #0

## 2023-10-24 NOTE — Patient Instructions (Addendum)
Continue to monitor your blood sugars as we discussed and record them. Bring the log to your next appointment.  Take your medications as directed.   ? ?Goal Blood glucose:  ?  Fasting (before meals) = 80 to 130 ?  Within 2 hours of eating = less than 180 ? ? ?Understanding your Hemoglobin A1c: 5.5 ? ? ? ? ?Diabetes Mellitus and Nutrition ? ? ? ?I think that you would greatly benefit from seeing a nutritionist. If this is something you are interested in, please call Dr Jenne Campus at [phone] to schedule an appointment. ? ? ?When you have diabetes (diabetes mellitus), it is very important to have healthy eating habits because your blood sugar (glucose) levels are greatly affected by what you eat and drink. Eating healthy foods in the appropriate amounts, at about the same times every day, can help you: ?Control your blood glucose. ?Lower your risk of heart disease. ?Improve your blood pressure. ?Reach or maintain a healthy weight. ? ?Every person with diabetes is different, and each person has different needs for a meal plan. Your health care provider may recommend that you work with a diet and nutrition specialist (dietitian) to make a meal plan that is best for you. Your meal plan may vary depending on factors such as: ?The calories you need. ?The medicines you take. ?Your weight. ?Your blood glucose, blood pressure, and cholesterol levels. ?Your activity level. ?Other health conditions you have, such as heart or kidney disease. ? ?How do carbohydrates affect me? ?Carbohydrates affect your blood glucose level more than any other type of food. Eating carbohydrates naturally increases the amount of glucose in your blood. Carbohydrate counting is a method for keeping track of how many carbohydrates you eat. Counting carbohydrates is important to keep your blood glucose at a healthy level, especially if you use insulin or take certain oral diabetes medicines. ?It is important to know how many carbohydrates you can safely  have in each meal. This is different for every person. Your dietitian can help you calculate how many carbohydrates you should have at each meal and for snack. ?Foods that contain carbohydrates include: ?Bread, cereal, rice, pasta, and crackers. ?Potatoes and corn. ?Peas, beans, and lentils. ?Milk and yogurt. ?Fruit and juice. ?Desserts, such as cakes, cookies, ice cream, and candy. ? ?How does alcohol affect me? ?Alcohol can cause a sudden decrease in blood glucose (hypoglycemia), especially if you use insulin or take certain oral diabetes medicines. Hypoglycemia can be a life-threatening condition. Symptoms of hypoglycemia (sleepiness, dizziness, and confusion) are similar to symptoms of having too much alcohol. ?If your health care provider says that alcohol is safe for you, follow these guidelines: ?Limit alcohol intake to no more than 1 drink per day for nonpregnant women and 2 drinks per day for men. One drink equals 12 oz of beer, 5 oz of wine, or 1? oz of hard liquor. ?Do not drink on an empty stomach. ?Keep yourself hydrated with water, diet soda, or unsweetened iced tea. ?Keep in mind that regular soda, juice, and other mixers may contain a lot of sugar and must be counted as carbohydrates. ? ?What are tips for following this plan? ? ?Reading food labels ?Start by checking the serving size on the label. The amount of calories, carbohydrates, fats, and other nutrients listed on the label are based on one serving of the food. Many foods contain more than one serving per package. ?Check the total grams (g) of carbohydrates in one serving. You can calculate  number of servings of carbohydrates in one serving by dividing the total carbohydrates by 15. For example, if a food has 30 g of total carbohydrates, it would be equal to 2 servings of carbohydrates. Check the number of grams (g) of saturated and trans fats in one serving. Choose foods that have low or no amount of these fats. Check the number of  milligrams (mg) of sodium in one serving. Most people should limit total sodium intake to less than 2,300 mg per day. Always check the nutrition information of foods labeled as "low-fat" or "nonfat". These foods may be higher in added sugar or refined carbohydrates and should be avoided. Talk to your dietitian to identify your daily goals for nutrients listed on the label.  Shopping Avoid buying canned, premade, or processed foods. These foods tend to be high in fat, sodium, and added sugar. Shop around the outside edge of the grocery store. This includes fresh fruits and vegetables, bulk grains, fresh meats, and fresh dairy.  Cooking Use low-heat cooking methods, such as baking, instead of high-heat cooking methods like deep frying. Cook using healthy oils, such as olive, canola, or sunflower oil. Avoid cooking with butter, cream, or high-fat meats.  Meal planning Eat meals and snacks regularly, preferably at the same times every day. Avoid going long periods of time without eating. Eat foods high in fiber, such as fresh fruits, vegetables, beans, and whole grains. Talk to your dietitian about how many servings of carbohydrates you can eat at each meal. Eat 4-6 ounces of lean protein each day, such as lean meat, chicken, fish, eggs, or tofu. 1 ounce is equal to 1 ounce of meat, chicken, or fish, 1 egg, or 1/4 cup of tofu. Eat some foods each day that contain healthy fats, such as avocado, nuts, seeds, and fish.  Lifestyle  Check your blood glucose regularly. Exercise at least 30 minutes 5 or more days each week, or as told by your health care provider. Take medicines as told by your health care provider. Do not use any products that contain nicotine or tobacco, such as cigarettes and e-cigarettes. If you need help quitting, ask your health care provider. Work with a counselor or diabetes educator to identify strategies to manage stress and any emotional and social challenges.  What are  some questions to ask my health care provider? Do I need to meet with a diabetes educator? Do I need to meet with a dietitian? What number can I call if I have questions? When are the best times to check my blood glucose?  Where to find more information: American Diabetes Association: diabetes.org/food-and-fitness/food Academy of Nutrition and Dietetics: www.eatright.org/resources/health/diseases-and-conditions/diabetes National Institute of Diabetes and Digestive and Kidney Diseases (NIH): www.niddk.nih.gov/health-information/diabetes/overview/diet-eating-physical-activity  Summary A healthy meal plan will help you control your blood glucose and maintain a healthy lifestyle. Working with a diet and nutrition specialist (dietitian) can help you make a meal plan that is best for you. Keep in mind that carbohydrates and alcohol have immediate effects on your blood glucose levels. It is important to count carbohydrates and to use alcohol carefully. This information is not intended to replace advice given to you by your health care provider. Make sure you discuss any questions you have with your health care provider. Document Released: 06/21/2005 Document Revised: 10/29/2016 Document Reviewed: 10/29/2016 [employer] Interactive Patient Education  2018 [employer]    

## 2023-10-24 NOTE — Progress Notes (Signed)
Subjective:  Patient ID: Bradley Goodman, male    DOB: 05/18/1956, 68 y.o.   MRN: [id number]  Patient Care Team: [name], FNP as PCP - General (Family Medicine) Jena Gauss Gerrit Friends, MD as Consulting Physician (Gastroenterology) Delora Fuel, OD (Optometry)   Chief Complaint:  Diabetes (3 month follow up )   HPI: Bradley Goodman is a 68 y.o. male presenting on 10/24/2023 for Diabetes (3 month follow up )   Discussed the use of AI scribe software for clinical note transcription with the patient, who gave verbal consent to proceed.  History of Present Illness   The patient, known with lung cancer and currently on Keytruda and Decadron, reports feeling "crazy" and "worn out" for the past couple of days. They also mention experiencing bloating, which they attribute to the Decadron. However, they note that the bloating has improved and is not as severe as it was previously. They deny any significant weight changes, headaches, chest pain, or leg swelling.  In addition to lung cancer, the patient has a history of thyroid issues and Addison's disease, both of which are being monitored by Dr. Fransico Him. They report no recent adjustments to their thyroid regimen.  The patient also has diabetes, which is being managed with Ozempic. They report feeling a bit sluggish, which they speculate might be due to their blood sugar levels being well-controlled. Their most recent A1c was 5.5, indicating good glycemic control.  The patient is also on Effexor for depression and anxiety. Feel the dose may need to be adjusted due to intermittent worsening symptoms.   Lastly, the patient is planning to reduce their work hours significantly, possibly retiring in the next six to seven months, but they intend to maintain their health insurance coverage.         10/24/2023   11:44 AM 06/07/2023    8:12 AM 04/16/2023    9:04 AM 10/16/2022   10:20 AM 07/31/2022    8:48 AM  Depression screen PHQ 2/9  Decreased  Interest 0 1 1 2    Down, Depressed, Hopeless 0 0 0 0   PHQ - 2 Score 0 1 1 2    Altered sleeping 1 0 0 0 0  Tired, decreased energy 1 1 2 2 1   Change in appetite 0 0 0 0 0  Feeling bad or failure about yourself  0 0 0 0 0  Trouble concentrating 0 0 0 0 0  Moving slowly or fidgety/restless 0 0 0 0 0  Suicidal thoughts 0 0 0 0 0  PHQ-9 Score 2 2 3 4    Difficult doing work/chores Not difficult at all Not difficult at all Not difficult at all Not difficult at all Not difficult at all      10/24/2023   11:45 AM 06/07/2023    8:13 AM 04/16/2023    9:04 AM 10/16/2022   10:20 AM  GAD 7 : Generalized Anxiety Score  Nervous, Anxious, on Edge 0 0 0 0  Control/stop worrying 0 0 0 0  Worry too much - different things 0 0 0 0  Trouble relaxing 0 0 0 0  Restless 0 0 0 0  Easily annoyed or irritable 1 1 1  0  Afraid - awful might happen 0 0 0 0  Total GAD 7 Score 1 1 1  0  Anxiety Difficulty Not difficult at all Not difficult at all Not difficult at all Not difficult at all        Relevant past  medical, surgical, family, and social history reviewed and updated as indicated.  Allergies and medications reviewed and updated. Data reviewed: Chart in Epic.   Past Medical History:  Diagnosis Date   Cervical spondylolysis    Essential hypertension    GERD (gastroesophageal reflux disease)    History of kidney stones    History of migraine    Hyperlipidemia    Hypertension    PONV (postoperative nausea and vomiting)    Type 2 diabetes mellitus (HCC)     Past Surgical History:  Procedure Laterality Date   Bilateral inguinal hernia repair     BRONCHIAL NEEDLE ASPIRATION BIOPSY  04/05/2021   Procedure: BRONCHIAL NEEDLE ASPIRATION BIOPSIES;  Surgeon: Josephine Igo, DO;  Location: [facility];  Service: Pulmonary;;   COLONOSCOPY  01/23/2012   Procedure: COLONOSCOPY;  Surgeon: Corbin Ade, MD;  Location: [facility];  Service: Endoscopy;  Laterality: N/A;  9:30 AM   COLONOSCOPY N/A 10/11/2015    Procedure: COLONOSCOPY;  Surgeon: Corbin Ade, MD;  Location: [facility];  Service: Endoscopy;  Laterality: N/A;  830   COLONOSCOPY N/A 10/14/2019   Procedure: COLONOSCOPY;  Surgeon: Corbin Ade, MD;  Location: [facility];  Service: Endoscopy;  Laterality: N/A;  1:45   POLYPECTOMY  10/14/2019   Procedure: POLYPECTOMY;  Surgeon: Corbin Ade, MD;  Location: [facility];  Service: Endoscopy;;  ascending colon, descending colon   VIDEO BRONCHOSCOPY WITH ENDOBRONCHIAL ULTRASOUND N/A 04/05/2021   Procedure: VIDEO BRONCHOSCOPY WITH ENDOBRONCHIAL ULTRASOUND;  Surgeon: Josephine Igo, DO;  Location: [facility];  Service: Pulmonary;  Laterality: N/A;    Social History   Socioeconomic History   Marital status: Married    Spouse name: Not on file   Number of children: Not on file   Years of education: Not on file   Highest education level: Bachelor's degree (e.g., BA, AB, BS)  Occupational History   Not on file  Tobacco Use   Smoking status: Every Day    Current packs/day: 0.50    Average packs/day: 0.5 packs/day for 50.0 years (25.0 ttl pk-yrs)    Types: Cigarettes    Start date: 10/30/1973   Smokeless tobacco: Never  Vaping Use   Vaping status: Never Used  Substance and Sexual Activity   Alcohol use: Not Currently    Comment: One drink every 6 months.   Drug use: No   Sexual activity: Yes  Other Topics Concern   Not on file  Social History Narrative   Not on file   Social Drivers of Health   Financial Resource Strain: Patient Declined (10/14/2023)   Overall Financial Resource Strain (CARDIA)    Difficulty of Paying Living Expenses: Patient declined  Food Insecurity: Patient Declined (10/14/2023)   Hunger Vital Sign    Worried About Running Out of Food in the Last Year: Patient declined    Ran Out of Food in the Last Year: Patient declined  Transportation Needs: Patient Declined (10/14/2023)   PRAPARE - Administrator, Civil Service (Medical): Patient  declined    Lack of Transportation (Non-Medical): Patient declined  Physical Activity: Unknown (10/14/2023)   Exercise Vital Sign    Days of Exercise per Week: 1 day    Minutes of Exercise per Session: Patient declined  Stress: No Stress Concern Present (10/14/2023)   [employer] of Occupational Health - Occupational Stress Questionnaire    Feeling of Stress : Not at all  Social Connections: Unknown (10/14/2023)  Social Advertising account executive [NHANES]    Frequency of Communication with Friends and Family: Patient declined    Frequency of Social Gatherings with Friends and Family: Patient declined    Attends Religious Services: Patient declined    Database administrator or Organizations: No    Attends Engineer, structural: Not on file    Marital Status: Patient declined  Intimate Partner Violence: Not on file    Outpatient Encounter Medications as of 10/24/2023  Medication Sig   cholecalciferol (VITAMIN D) 1000 UNITS tablet Take 1,000 Units by mouth every evening.   Continuous Glucose Sensor (FREESTYLE LIBRE 3 SENSOR) MISC Place 1 sensor on the skin every 14 days. Use to check glucose continuously   dexamethasone (DECADRON) 1 MG tablet Take 1 tablet (1 mg total) by mouth daily with breakfast.   docusate sodium (COLACE) 100 MG capsule Take 100 mg by mouth daily.   folic acid (FOLVITE) 1 MG tablet Take 1 tablet (1 mg total) by mouth daily.   Krill Oil 300 MG CAPS Take 300 mg by mouth every evening.   levothyroxine (SYNTHROID) 88 MCG tablet Take 1 tablet (88 mcg total) by mouth daily before breakfast.   loratadine (CLARITIN) 10 MG tablet Take 10 mg by mouth every evening.    losartan (COZAAR) 50 MG tablet Take 1.5 tablets (75 mg total) by mouth daily.   mirtazapine (REMERON) 15 MG tablet Take 1 tablet (15 mg total) by mouth at bedtime.   Multiple Vitamins-Minerals (MULTIVITAMIN GUMMIES ADULT PO) Take 2 each by mouth daily.   PRESCRIPTION MEDICATION Testosterone injection    prochlorperazine (COMPAZINE) 10 MG tablet Take 1 tablet (10 mg total) by mouth every 6 (six) hours as needed for nausea or vomiting.   Semaglutide,0.25 or 0.5MG /DOS, (OZEMPIC, 0.25 OR 0.5 MG/DOSE,) 2 MG/3ML SOPN Inject 0.5 mg into the skin once a week.   SYRINGE-NEEDLE, DISP, 3 ML (B-D 3CC LUER-LOK SYR 21GX1-1/2) 21G X 1-1/2" 3 ML MISC Use to inject testosterone every week   testosterone cypionate (DEPOTESTOTERONE CYPIONATE) 100 MG/ML injection Take 50mg  ( 0.73ml) and 100mg  ( 1ml) into muscle alternatively weekly.   [DISCONTINUED] pantoprazole (PROTONIX) 40 MG tablet Take 1 tablet (40 mg total) by mouth every evening.   [DISCONTINUED] rosuvastatin (CRESTOR) 20 MG tablet Take 1 tablet (20 mg total) by mouth daily.   [DISCONTINUED] venlafaxine XR (EFFEXOR-XR) 75 MG 24 hr capsule Take 1 capsule (75 mg total) by mouth daily with breakfast.   pantoprazole (PROTONIX) 40 MG tablet Take 1 tablet (40 mg total) by mouth every evening.   rosuvastatin (CRESTOR) 20 MG tablet Take 1 tablet (20 mg total) by mouth daily.   venlafaxine XR (EFFEXOR-XR) 75 MG 24 hr capsule Take 1 capsule (75 mg total) by mouth daily with breakfast.   No facility-administered encounter medications on file as of 10/24/2023.    No Known Allergies  Pertinent ROS per HPI, otherwise unremarkable      Objective:  BP 109/79   Pulse 95   Temp 97.6 F (36.4 C)   Ht 5\' 11"  (1.803 m)   Wt 182 lb 12.8 oz (82.9 kg)   BMI 25.50 kg/m    Wt Readings from Last 3 Encounters:  10/24/23 182 lb 12.8 oz (82.9 kg)  10/08/23 186 lb 6.4 oz (84.6 kg)  09/16/23 185 lb 11.2 oz (84.2 kg)    Physical Exam Vitals and nursing note reviewed.  Constitutional:      General: He is not in acute distress.  Appearance: Normal appearance. He is normal weight. He is not ill-appearing, toxic-appearing or diaphoretic.  HENT:     Head: Normocephalic and atraumatic.     Mouth/Throat:     Mouth: Mucous membranes are moist.  Eyes:      Conjunctiva/sclera: Conjunctivae normal.     Pupils: Pupils are equal, round, and reactive to light.  Cardiovascular:     Rate and Rhythm: Normal rate and regular rhythm.     Heart sounds: Normal heart sounds.  Pulmonary:     Effort: Pulmonary effort is normal.     Breath sounds: Normal breath sounds.  Abdominal:     General: Bowel sounds are normal. There is distension.     Tenderness: There is no abdominal tenderness.  Musculoskeletal:     Cervical back: Normal range of motion and neck supple.     Right lower leg: No edema.     Left lower leg: No edema.  Skin:    General: Skin is warm and dry.     Capillary Refill: Capillary refill takes less than 2 seconds.  Neurological:     General: No focal deficit present.     Mental Status: He is alert and oriented to person, place, and time.  Psychiatric:        Mood and Affect: Mood normal.        Behavior: Behavior normal.        Thought Content: Thought content normal.        Judgment: Judgment normal.       Results for orders placed or performed in visit on 10/08/23  CMP (Cancer Center only)   Collection Time: 10/08/23  1:05 PM  Result Value Ref Range   Sodium 142 135 - 145 mmol/L   Potassium 3.6 3.5 - 5.1 mmol/L   Chloride 108 98 - 111 mmol/L   CO2 27 22 - 32 mmol/L   Glucose, Bld 86 70 - 99 mg/dL   BUN 12 8 - 23 mg/dL   Creatinine 7.82 9.56 - 1.24 mg/dL   Calcium 9.0 8.9 - 21.3 mg/dL   Total Protein 6.8 6.5 - 8.1 g/dL   Albumin 3.9 3.5 - 5.0 g/dL   AST 19 15 - 41 U/L   ALT 19 0 - 44 U/L   Alkaline Phosphatase 65 38 - 126 U/L   Total Bilirubin 0.2 0.0 - 1.2 mg/dL   GFR, Estimated >08 >65 mL/min   Anion gap 7 5 - 15  CBC with Differential (Cancer Center Only)   Collection Time: 10/08/23  1:05 PM  Result Value Ref Range   WBC Count 10.4 4.0 - 10.5 K/uL   RBC 5.04 4.22 - 5.81 MIL/uL   Hemoglobin 12.9 (L) 13.0 - 17.0 g/dL   HCT 78.4 69.6 - 29.5 %   MCV 84.5 80.0 - 100.0 fL   MCH 25.6 (L) 26.0 - 34.0 pg   MCHC 30.3  30.0 - 36.0 g/dL   RDW 28.4 (H) 13.2 - 44.0 %   Platelet Count 327 150 - 400 K/uL   nRBC 0.0 0.0 - 0.2 %   Neutrophils Relative % 74 %   Neutro Abs 7.7 1.7 - 7.7 K/uL   Lymphocytes Relative 17 %   Lymphs Abs 1.7 0.7 - 4.0 K/uL   Monocytes Relative 6 %   Monocytes Absolute 0.7 0.1 - 1.0 K/uL   Eosinophils Relative 1 %   Eosinophils Absolute 0.1 0.0 - 0.5 K/uL   Basophils Relative 1 %   Basophils Absolute  0.1 0.0 - 0.1 K/uL   Immature Granulocytes 1 %   Abs Immature Granulocytes 0.05 0.00 - 0.07 K/uL  TSH   Collection Time: 10/08/23  1:05 PM  Result Value Ref Range   TSH 0.133 (L) 0.350 - 4.500 uIU/mL       Pertinent labs & imaging results that were available during my care of the patient were reviewed by me and considered in my medical decision making.  Assessment & Plan:  Chloe was seen today for diabetes.  Diagnoses and all orders for this visit:  Type 2 diabetes mellitus with hyperglycemia, without long-term current use of insulin (HCC) -     Bayer DCA Hb A1c Waived -     Microalbumin / creatinine urine ratio  Recurrent major depression in remission (HCC) -     venlafaxine XR (EFFEXOR-XR) 75 MG 24 hr capsule; Take 1 capsule (75 mg total) by mouth daily with breakfast.  Hypertension associated with type 2 diabetes mellitus (HCC) -     Microalbumin / creatinine urine ratio  Hyperlipidemia associated with type 2 diabetes mellitus (HCC) -     rosuvastatin (CRESTOR) 20 MG tablet; Take 1 tablet (20 mg total) by mouth daily.  Gastroesophageal reflux disease without esophagitis -     pantoprazole (PROTONIX) 40 MG tablet; Take 1 tablet (40 mg total) by mouth every evening.     Assessment and Plan    Lung Cancer Continues on Keytruda and Decadron. No significant weight changes other than bloating due to Decadron. No new symptoms such as hemoptysis or changes in voice. Chronic steroid use causing swelling and weight gain. Discussed the importance of monitoring for new  symptoms and maintaining current treatment regimen. - Continue Keytruda - Continue Decadron - Monitor for new symptoms such as hemoptysis or changes in voice  Diabetes Mellitus A1c is 5.5, indicating good control. On Ozempic, which will be adjusted if A1c trends lower. Urine to be checked for protein to monitor for diabetic nephropathy. - Continue current Ozempic dose - Re-evaluate A1c in 3 months - Check urine for protein to monitor for diabetic nephropathy  Hypertension Blood pressure is well-controlled with current medication. No reported issues or side effects. - Continue current antihypertensive medication  Hyperlipidemia On Crestor. No reported issues or side effects. - Refill Crestor prescription  Gastroesophageal Reflux Disease (GERD) Reflux is well-controlled with Protonix. No new symptoms reported. - Refill Protonix prescription  Hypothyroidism Thyroid function is stable with current regimen. Follow-up with Dr. Fransico Him at the end of the month. - Continue current thyroid medication - Follow-up with Dr. Fransico Him at the end of the month  Addison's Disease Condition is being monitored by Dr. Fransico Him. No recent adjustments needed. - Continue current management - Follow-up with Dr. Fransico Him at the end of the month  Depression On Effexor. Considering increasing the dose to 150 mg for better symptom control. Discussed increasing dose to 150 mg and monitoring response. Insurance should cover the increase. - Increase Effexor to 150 mg daily - Monitor response to increased dose - Refill Effexor prescription  General Health Maintenance Discussed the importance of maintaining insurance for ongoing treatments. Considering Medicare Part D for medication coverage. - Continue to work part-time to [employer] coverage - Consider Medicare Part D for medication coverage  Follow-up - Follow-up with Dr. Fransico Him at the end of the month - Follow-up in 3 months for A1c re-evaluation.           Continue all other maintenance medications.  Follow up plan:  Return in about 3 months (around 01/22/2024), or if symptoms worsen or fail to improve, for DM.   Continue healthy lifestyle choices, including diet (rich in fruits, vegetables, and lean proteins, and low in salt and simple carbohydrates) and exercise (at least 30 minutes of moderate physical activity daily).  Educational handout given for DM  The above assessment and management plan was discussed with the patient. The patient verbalized understanding of and has agreed to the management plan. Patient is aware to call the clinic if they develop any new symptoms or if symptoms persist or worsen. Patient is aware when to return to the clinic for a follow-up visit. Patient educated on when it is appropriate to go to the emergency department.   Kari Baars, FNP-C [facility] [phone]

## 2023-10-25 ENCOUNTER — Other Ambulatory Visit (HOSPITAL_COMMUNITY)
Admission: RE | Admit: 2023-10-25 | Discharge: 2023-10-25 | Disposition: A | Discharge status: Home / Self Care | Payer: Commercial Managed Care - PPO | Source: Ambulatory Visit | Attending: "Endocrinology | Admitting: "Endocrinology

## 2023-10-25 DIAGNOSIS — E230 Hypopituitarism: Secondary | ICD-10-CM | POA: Diagnosis not present

## 2023-10-25 LAB — LIPID PANEL
Cholesterol: 103 mg/dL (ref 0–200)
HDL: 37 mg/dL — ABNORMAL LOW (ref >40)
LDL Cholesterol: 34 mg/dL (ref 0–99)
Total CHOL/HDL Ratio: 2.8 {ratio}
Triglycerides: 158 mg/dL — ABNORMAL HIGH (ref <150)
VLDL: 32 mg/dL (ref 0–40)

## 2023-10-25 LAB — PSA: Prostatic Specific Antigen: 2.77 ng/mL (ref 0.00–4.00)

## 2023-10-25 LAB — TSH: TSH: 0.199 u[IU]/mL — ABNORMAL LOW (ref 0.350–4.500)

## 2023-10-25 LAB — MICROALBUMIN / CREATININE URINE RATIO
Creatinine, Urine: 155.1 mg/dL
Microalb/Creat Ratio: 7 mg/g{creat} (ref 0–29)
Microalbumin, Urine: 10.2 ug/mL

## 2023-10-25 LAB — T4, FREE: Free T4: 0.76 ng/dL (ref 0.61–1.12)

## 2023-10-27 ENCOUNTER — Other Ambulatory Visit: Payer: Self-pay | Admitting: Family Medicine

## 2023-10-27 DIAGNOSIS — R630 Anorexia: Secondary | ICD-10-CM

## 2023-10-27 DIAGNOSIS — F3340 Major depressive disorder, recurrent, in remission, unspecified: Secondary | ICD-10-CM

## 2023-10-27 DIAGNOSIS — G479 Sleep disorder, unspecified: Secondary | ICD-10-CM

## 2023-10-28 ENCOUNTER — Other Ambulatory Visit: Payer: Self-pay

## 2023-10-28 LAB — TESTOSTERONE,FREE AND TOTAL
Testosterone, Free: 4.4 pg/mL — ABNORMAL LOW (ref 6.6–18.1)
Testosterone: 508 ng/dL (ref 264–916)

## 2023-10-28 MED ORDER — MIRTAZAPINE 15 MG PO TABS
15.0000 mg | ORAL_TABLET | Freq: Every day | ORAL | 0 refills | Status: DC
  Filled 2023-10-28: qty 90, 90d supply, fill #0

## 2023-10-29 ENCOUNTER — Inpatient Hospital Stay: Payer: Commercial Managed Care - PPO

## 2023-10-29 ENCOUNTER — Inpatient Hospital Stay: Payer: Commercial Managed Care - PPO | Attending: Physician Assistant | Admitting: Internal Medicine

## 2023-10-29 ENCOUNTER — Ambulatory Visit: Payer: Commercial Managed Care - PPO

## 2023-10-29 VITALS — BP 153/91 | HR 95 | Temp 97.0°F | Resp 17 | Ht 71.0 in | Wt 184.9 lb

## 2023-10-29 DIAGNOSIS — E538 Deficiency of other specified B group vitamins: Secondary | ICD-10-CM | POA: Diagnosis not present

## 2023-10-29 DIAGNOSIS — Z79899 Other long term (current) drug therapy: Secondary | ICD-10-CM | POA: Insufficient documentation

## 2023-10-29 DIAGNOSIS — C3490 Malignant neoplasm of unspecified part of unspecified bronchus or lung: Secondary | ICD-10-CM

## 2023-10-29 DIAGNOSIS — D649 Anemia, unspecified: Secondary | ICD-10-CM | POA: Insufficient documentation

## 2023-10-29 DIAGNOSIS — R5383 Other fatigue: Secondary | ICD-10-CM | POA: Insufficient documentation

## 2023-10-29 DIAGNOSIS — C3491 Malignant neoplasm of unspecified part of right bronchus or lung: Secondary | ICD-10-CM

## 2023-10-29 DIAGNOSIS — C7951 Secondary malignant neoplasm of bone: Secondary | ICD-10-CM | POA: Diagnosis not present

## 2023-10-29 DIAGNOSIS — Z5112 Encounter for antineoplastic immunotherapy: Secondary | ICD-10-CM | POA: Diagnosis present

## 2023-10-29 DIAGNOSIS — Z5111 Encounter for antineoplastic chemotherapy: Secondary | ICD-10-CM | POA: Diagnosis present

## 2023-10-29 DIAGNOSIS — C3411 Malignant neoplasm of upper lobe, right bronchus or lung: Secondary | ICD-10-CM | POA: Insufficient documentation

## 2023-10-29 DIAGNOSIS — E079 Disorder of thyroid, unspecified: Secondary | ICD-10-CM

## 2023-10-29 DIAGNOSIS — Z79620 Long term (current) use of immunosuppressive biologic: Secondary | ICD-10-CM | POA: Insufficient documentation

## 2023-10-29 DIAGNOSIS — C7931 Secondary malignant neoplasm of brain: Secondary | ICD-10-CM | POA: Insufficient documentation

## 2023-10-29 LAB — CBC WITH DIFFERENTIAL (CANCER CENTER ONLY)
Abs Immature Granulocytes: 0.09 10*3/uL — ABNORMAL HIGH (ref 0.00–0.07)
Basophils Absolute: 0.1 10*3/uL (ref 0.0–0.1)
Basophils Relative: 1 %
Eosinophils Absolute: 0.2 10*3/uL (ref 0.0–0.5)
Eosinophils Relative: 1 %
HCT: 41.3 % (ref 39.0–52.0)
Hemoglobin: 12.3 g/dL — ABNORMAL LOW (ref 13.0–17.0)
Immature Granulocytes: 1 %
Lymphocytes Relative: 18 %
Lymphs Abs: 2.4 10*3/uL (ref 0.7–4.0)
MCH: 25.8 pg — ABNORMAL LOW (ref 26.0–34.0)
MCHC: 29.8 g/dL — ABNORMAL LOW (ref 30.0–36.0)
MCV: 86.8 fL (ref 80.0–100.0)
Monocytes Absolute: 0.9 10*3/uL (ref 0.1–1.0)
Monocytes Relative: 7 %
Neutro Abs: 9.6 10*3/uL — ABNORMAL HIGH (ref 1.7–7.7)
Neutrophils Relative %: 72 %
Platelet Count: 341 10*3/uL (ref 150–400)
RBC: 4.76 MIL/uL (ref 4.22–5.81)
RDW: 19.1 % — ABNORMAL HIGH (ref 11.5–15.5)
WBC Count: 13.2 10*3/uL — ABNORMAL HIGH (ref 4.0–10.5)
nRBC: 0 % (ref 0.0–0.2)

## 2023-10-29 LAB — TSH: TSH: 0.21 u[IU]/mL — ABNORMAL LOW (ref 0.350–4.500)

## 2023-10-29 LAB — T4, FREE: Free T4: 0.88 ng/dL (ref 0.61–1.12)

## 2023-10-29 LAB — CMP (CANCER CENTER ONLY)
ALT: 14 U/L (ref 0–44)
AST: 18 U/L (ref 15–41)
Albumin: 4 g/dL (ref 3.5–5.0)
Alkaline Phosphatase: 65 U/L (ref 38–126)
Anion gap: 8 (ref 5–15)
BUN: 14 mg/dL (ref 8–23)
CO2: 29 mmol/L (ref 22–32)
Calcium: 9.2 mg/dL (ref 8.9–10.3)
Chloride: 106 mmol/L (ref 98–111)
Creatinine: 1.02 mg/dL (ref 0.61–1.24)
GFR, Estimated: >60 mL/min (ref >60)
Glucose, Bld: 86 mg/dL (ref 70–99)
Potassium: 4.1 mmol/L (ref 3.5–5.1)
Sodium: 143 mmol/L (ref 135–145)
Total Bilirubin: 0.3 mg/dL (ref 0.0–1.2)
Total Protein: 6.8 g/dL (ref 6.5–8.1)

## 2023-10-29 MED ORDER — SODIUM CHLORIDE 0.9 % IV SOLN
500.0000 mg/m2 | Freq: Once | INTRAVENOUS | Status: AC
  Administered 2023-10-29: 1000 mg via INTRAVENOUS
  Filled 2023-10-29: qty 40

## 2023-10-29 MED ORDER — CYANOCOBALAMIN 1000 MCG/ML IJ SOLN
1000.0000 ug | Freq: Once | INTRAMUSCULAR | Status: DC
Start: 2023-10-29 — End: 2023-10-29

## 2023-10-29 MED ORDER — SODIUM CHLORIDE 0.9 % IV SOLN
200.0000 mg | Freq: Once | INTRAVENOUS | Status: AC
  Administered 2023-10-29: 200 mg via INTRAVENOUS
  Filled 2023-10-29: qty 200

## 2023-10-29 MED ORDER — PROCHLORPERAZINE MALEATE 10 MG PO TABS
10.0000 mg | ORAL_TABLET | Freq: Once | ORAL | Status: AC
  Administered 2023-10-29: 10 mg via ORAL
  Filled 2023-10-29: qty 1

## 2023-10-29 NOTE — Progress Notes (Signed)
[facility] [phone]   [phone]  OFFICE PROGRESS NOTE  Bradley Masters, FNP [address] [place] [address]  DIAGNOSIS: Stage IV (T1b, N3, M1C) non-small cell lung cancer, favoring adenocarcinoma presented with right upper lobe lung nodule in addition to right hilar, subcarinal and bilateral mediastinal as well as supraclavicular lymphadenopathy in addition to bone and brain metastasis diagnosed in June 2022.     PD-L1 expression 80%.     Molecular Studies:  Biomarker Findings Microsatellite status - MS-Stable Tumor Mutational Burden - 6 Muts/Mb Genomic Findings For a complete list of the genes assayed, please refer to the Appendix. KRAS G12C, amplification ATM S470* CCND1 amplification - equivocal? HGF amplification - equivocal? MYC amplification - equivocal? FGF19 amplification - equivocal? FGF3 amplification - equivocal? FGF4 amplification - equivocal? NFKBIA amplification NKX2-1 amplification RAD21 amplification - equivocal? [id number]*26 TERT promoter -124C>T TP53 rearrangement exon 9 7 Disease relevant genes with no reportable alterations: ALK, BRAF, EGFR, ERBB2, MET, RET, ROS1   PRIOR THERAPY: SRS to the metastatic brain lesions under the care of Dr. Kathrynn Running.  Last treatment on 05/04/2021.   CURRENT THERAPY: Palliative systemic chemotherapy with carboplatin for an AUC 5, Alimta 500 mg/m2 and, Keytruda 200 mg IV every 3 weeks.  First dose expected on 05/08/2021.  Status post 42 cycles.  Starting from cycle #5 he is on maintenance treatment with Alimta and Keytruda every 3 weeks.  INTERVAL HISTORY: Bradley Goodman 68 y.o. male returns to the clinic today for follow-up visit accompanied by his wife.Discussed the use of AI scribe software for clinical note transcription with the patient, who gave verbal consent to proceed.  History of Present Illness   The patient, a 68 year old with a history of stage 4 cancer  NSCLC, Adenocarcinoma, has been undergoing treatment with Carboplatin, Alimta, and Keytruda for 4 cycles. He has been on maintenance therapy with Alimta and Keytruda every three weeks for total of 42 cycles. Over the past three weeks, the patient reports no significant changes in his health status. He has not experienced any nausea, vomiting, diarrhea, or recent weight loss. His weight has remained stable. He has not reported any headaches, changes in vision, or double vision. The patient's lab work shows mild anemia with a hemoglobin level of 12.3. The patient's Effexor dosage was recently increased, which he reports has been helpful. The patient's last scan was not specified in the conversation. The patient is married and recently celebrated his 46th wedding anniversary.       MEDICAL HISTORY: Past Medical History:  Diagnosis Date   Cervical spondylolysis    Essential hypertension    GERD (gastroesophageal reflux disease)    History of kidney stones    History of migraine    Hyperlipidemia    Hypertension    PONV (postoperative nausea and vomiting)    Type 2 diabetes mellitus (HCC)     ALLERGIES:  has no known allergies.  MEDICATIONS:  Current Outpatient Medications  Medication Sig Dispense Refill   cholecalciferol (VITAMIN D) 1000 UNITS tablet Take 1,000 Units by mouth every evening.     Continuous Glucose Sensor (FREESTYLE LIBRE 3 SENSOR) MISC Place 1 sensor on the skin every 14 days. Use to check glucose continuously 6 each 3   dexamethasone (DECADRON) 1 MG tablet Take 1 tablet (1 mg total) by mouth daily with breakfast. 90 tablet 1   docusate sodium (COLACE) 100 MG capsule Take 100 mg by mouth daily.  folic acid (FOLVITE) 1 MG tablet Take 1 tablet (1 mg total) by mouth daily. 90 tablet 0   Krill Oil 300 MG CAPS Take 300 mg by mouth every evening.     levothyroxine (SYNTHROID) 88 MCG tablet Take 1 tablet (88 mcg total) by mouth daily before breakfast. 90 tablet 1   loratadine  (CLARITIN) 10 MG tablet Take 10 mg by mouth every evening.      losartan (COZAAR) 50 MG tablet Take 1.5 tablets (75 mg total) by mouth daily. 135 tablet 1   mirtazapine (REMERON) 15 MG tablet Take 1 tablet (15 mg total) by mouth at bedtime. 90 tablet 0   Multiple Vitamins-Minerals (MULTIVITAMIN GUMMIES ADULT PO) Take 2 each by mouth daily.     pantoprazole (PROTONIX) 40 MG tablet Take 1 tablet (40 mg total) by mouth every evening. 90 tablet 1   PRESCRIPTION MEDICATION Testosterone injection     prochlorperazine (COMPAZINE) 10 MG tablet Take 1 tablet (10 mg total) by mouth every 6 (six) hours as needed for nausea or vomiting. 30 tablet 0   rosuvastatin (CRESTOR) 20 MG tablet Take 1 tablet (20 mg total) by mouth daily. 90 tablet 1   Semaglutide,0.25 or 0.5MG /DOS, (OZEMPIC, 0.25 OR 0.5 MG/DOSE,) 2 MG/3ML SOPN Inject 0.5 mg into the skin once a week. 9 mL 0   SYRINGE-NEEDLE, DISP, 3 ML (B-D 3CC LUER-LOK SYR 21GX1-1/2) 21G X 1-1/2" 3 ML MISC Use to inject testosterone every week 100 each 2   testosterone cypionate (DEPOTESTOTERONE CYPIONATE) 100 MG/ML injection Take 50mg  ( 0.61ml) and 100mg  ( 1ml) into muscle alternatively weekly. 10 mL 1   venlafaxine XR (EFFEXOR-XR) 75 MG 24 hr capsule Take 1 capsule (75 mg total) by mouth daily with breakfast. 90 capsule 1   No current facility-administered medications for this visit.    SURGICAL HISTORY:  Past Surgical History:  Procedure Laterality Date   Bilateral inguinal hernia repair     BRONCHIAL NEEDLE ASPIRATION BIOPSY  04/05/2021   Procedure: BRONCHIAL NEEDLE ASPIRATION BIOPSIES;  Surgeon: Josephine Igo, DO;  Location: [facility];  Service: Pulmonary;;   COLONOSCOPY  01/23/2012   Procedure: COLONOSCOPY;  Surgeon: Corbin Ade, MD;  Location: [facility];  Service: Endoscopy;  Laterality: N/A;  9:30 AM   COLONOSCOPY N/A 10/11/2015   Procedure: COLONOSCOPY;  Surgeon: Corbin Ade, MD;  Location: [facility];  Service: Endoscopy;  Laterality:  N/A;  830   COLONOSCOPY N/A 10/14/2019   Procedure: COLONOSCOPY;  Surgeon: Corbin Ade, MD;  Location: [facility];  Service: Endoscopy;  Laterality: N/A;  1:45   POLYPECTOMY  10/14/2019   Procedure: POLYPECTOMY;  Surgeon: Corbin Ade, MD;  Location: [facility];  Service: Endoscopy;;  ascending colon, descending colon   VIDEO BRONCHOSCOPY WITH ENDOBRONCHIAL ULTRASOUND N/A 04/05/2021   Procedure: VIDEO BRONCHOSCOPY WITH ENDOBRONCHIAL ULTRASOUND;  Surgeon: Josephine Igo, DO;  Location: [facility];  Service: Pulmonary;  Laterality: N/A;    REVIEW OF SYSTEMS:  A comprehensive review of systems was negative except for: Constitutional: positive for fatigue   PHYSICAL EXAMINATION: General appearance: alert, cooperative, appears stated age, and no distress Head: Normocephalic, without obvious abnormality, atraumatic Neck: no adenopathy, no JVD, supple, symmetrical, trachea midline, and thyroid not enlarged, symmetric, no tenderness/mass/nodules Lymph nodes: Cervical, supraclavicular, and axillary nodes normal. Resp: clear to auscultation bilaterally Back: symmetric, no curvature. ROM normal. No CVA tenderness. Cardio: regular rate and rhythm, S1, S2 normal, no murmur, click, rub or gallop GI:  soft, non-tender; bowel sounds normal; no masses,  no organomegaly Extremities: extremities normal, atraumatic, no cyanosis or edema  ECOG PERFORMANCE STATUS: 1 - Symptomatic but completely ambulatory  Blood pressure (!) 153/91, pulse 95, temperature (!) 97 F (36.1 C), temperature source Temporal, resp. rate 17, height 5\' 11"  (1.803 m), weight 184 lb 14.4 oz (83.9 kg), SpO2 98%.  LABORATORY DATA: Lab Results  Component Value Date   WBC 13.2 (H) 10/29/2023   HGB 12.3 (L) 10/29/2023   HCT 41.3 10/29/2023   MCV 86.8 10/29/2023   PLT 341 10/29/2023      Chemistry      Component Value Date/Time   NA 142 10/08/2023 1305   NA 145 (H) 06/07/2023 0825   K 3.6 10/08/2023 1305   CL 108  10/08/2023 1305   CO2 27 10/08/2023 1305   BUN 12 10/08/2023 1305   BUN 19 06/07/2023 0825   CREATININE 1.01 10/08/2023 1305      Component Value Date/Time   CALCIUM 9.0 10/08/2023 1305   ALKPHOS 65 10/08/2023 1305   AST 19 10/08/2023 1305   ALT 19 10/08/2023 1305   BILITOT 0.2 10/08/2023 1305       RADIOGRAPHIC STUDIES: No results found.  ASSESSMENT AND PLAN: This is a very pleasant 68 years old white male recently diagnosed with stage IV (T1b, N3, M1 C) non-small cell lung cancer favoring adenocarcinoma presented with right upper lobe lung nodule in addition to right hilar, subcarinal and bilateral mediastinal as well as supraclavicular lymphadenopathy.  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  His PD-L1 expression is 80% and his molecular studies showed KRAS G12C mutation. The patient underwent SRS to metastatic brain lesion under the care of Dr. Kathrynn Running and he is currently undergoing systemic chemotherapy with carboplatin for AUC of 5, Alimta 500 Mg/M2 and Keytruda 200 Mg IV every 3 weeks status post 42 cycles.  Starting from cycle #5 the patient will be treated with maintenance treatment with Alimta and Keytruda every 3 weeks.  The patient has been tolerating this treatment well with no concerning adverse effects.    Non-Small Cell Lung Cancer (NSCLC) 68 year old with stage IV NSCLC, adenocarcinoma subtype, and KRAS G12C mutation. Currently on maintenance therapy with Alimta and Keytruda every three weeks. Completed 42 cycles and here for the 43rd cycle. Reports no new symptoms. Mild anemia with hemoglobin at 12.3, which is well-managed. Effexor dose increased by family doctor with beneficial effects. - Continue Alimta and Keytruda every three weeks - order CT scan CAP before next visit. - Follow-up in three weeks  Mild Anemia Mild anemia with hemoglobin at 12.3, well-managed. No new symptoms reported. - Monitor hemoglobin levels  General Health  Maintenance No specific issues discussed.  Follow-up - Follow-up in three weeks for next cycle of maintenance therapy   He was advised to call immediately if he has any concerning symptoms in the interval. The patient voices understanding of current disease status and treatment options and is in agreement with the current care plan.  All questions were answered. The patient knows to call the clinic with any problems, questions or concerns. We can certainly see the patient much sooner if necessary. The total time spent in the appointment was 20 minutes.  Disclaimer: This note was dictated with voice recognition software. Similar sounding words can inadvertently be transcribed and may not be corrected upon review.

## 2023-11-07 ENCOUNTER — Ambulatory Visit: Payer: Commercial Managed Care - PPO | Admitting: "Endocrinology

## 2023-11-07 ENCOUNTER — Encounter: Payer: Self-pay | Admitting: "Endocrinology

## 2023-11-07 VITALS — BP 134/84 | HR 96 | Ht 71.0 in | Wt 182.0 lb

## 2023-11-07 DIAGNOSIS — E230 Hypopituitarism: Secondary | ICD-10-CM | POA: Diagnosis not present

## 2023-11-07 DIAGNOSIS — E2740 Unspecified adrenocortical insufficiency: Secondary | ICD-10-CM | POA: Diagnosis not present

## 2023-11-07 DIAGNOSIS — F17200 Nicotine dependence, unspecified, uncomplicated: Secondary | ICD-10-CM | POA: Diagnosis not present

## 2023-11-07 DIAGNOSIS — E039 Hypothyroidism, unspecified: Secondary | ICD-10-CM

## 2023-11-07 DIAGNOSIS — E291 Testicular hypofunction: Secondary | ICD-10-CM

## 2023-11-07 NOTE — Progress Notes (Signed)
Endocrinology follow-up note                                             11/07/2023, 11:45 AM   Subjective:    Patient ID: Bradley Goodman, male    DOB: [birth year], PCP [name], FNP   Past Medical History:  Diagnosis Date   Cervical spondylolysis    Essential hypertension    GERD (gastroesophageal reflux disease)    History of kidney stones    History of migraine    Hyperlipidemia    Hypertension    PONV (postoperative nausea and vomiting)    Type 2 diabetes mellitus (HCC)    Past Surgical History:  Procedure Laterality Date   Bilateral inguinal hernia repair     BRONCHIAL NEEDLE ASPIRATION BIOPSY  04/05/2021   Procedure: BRONCHIAL NEEDLE ASPIRATION BIOPSIES;  Surgeon: Josephine Igo, DO;  Location: [facility];  Service: Pulmonary;;   COLONOSCOPY  01/23/2012   Procedure: COLONOSCOPY;  Surgeon: Corbin Ade, MD;  Location: [facility];  Service: Endoscopy;  Laterality: N/A;  9:30 AM   COLONOSCOPY N/A 10/11/2015   Procedure: COLONOSCOPY;  Surgeon: Corbin Ade, MD;  Location: [facility];  Service: Endoscopy;  Laterality: N/A;  830   COLONOSCOPY N/A 10/14/2019   Procedure: COLONOSCOPY;  Surgeon: Corbin Ade, MD;  Location: [facility];  Service: Endoscopy;  Laterality: N/A;  1:45   POLYPECTOMY  10/14/2019   Procedure: POLYPECTOMY;  Surgeon: Corbin Ade, MD;  Location: [facility];  Service: Endoscopy;;  ascending colon, descending colon   VIDEO BRONCHOSCOPY WITH ENDOBRONCHIAL ULTRASOUND N/A 04/05/2021   Procedure: VIDEO BRONCHOSCOPY WITH ENDOBRONCHIAL ULTRASOUND;  Surgeon: Josephine Igo, DO;  Location: [facility];  Service: Pulmonary;  Laterality: N/A;   Social History   Socioeconomic History   Marital status: Married    Spouse name: Not on file   Number of children: Not on file   Years of education: Not on file   Highest education level: Bachelor's degree (e.g., BA, AB, BS)  Occupational History   Not on file  Tobacco Use   Smoking  status: Every Day    Current packs/day: 0.50    Average packs/day: 0.5 packs/day for 50.0 years (25.0 ttl pk-yrs)    Types: Cigarettes    Start date: 10/30/1973   Smokeless tobacco: Never  Vaping Use   Vaping status: Never Used  Substance and Sexual Activity   Alcohol use: Not Currently    Comment: One drink every 6 months.   Drug use: No   Sexual activity: Yes  Other Topics Concern   Not on file  Social History Narrative   Not on file   Social Drivers of Health   Financial Resource Strain: Patient Declined (10/14/2023)   Overall Financial Resource Strain (CARDIA)    Difficulty of Paying Living Expenses: Patient declined  Food Insecurity: Patient Declined (10/14/2023)   Hunger Vital Sign    Worried About Running Out of Food in the Last Year: Patient declined    Ran Out of Food in the Last Year: Patient declined  Transportation Needs: Patient Declined (10/14/2023)   PRAPARE - Administrator, Civil Service (Medical): Patient declined    Lack of Transportation (Non-Medical): Patient declined  Physical Activity: Unknown (10/14/2023)   Exercise Vital Sign  Days of Exercise per Week: 1 day    Minutes of Exercise per Session: Patient declined  Stress: No Stress Concern Present (10/14/2023)   [employer] of Occupational Health - Occupational Stress Questionnaire    Feeling of Stress : Not at all  Social Connections: Unknown (10/14/2023)   Social Connection and Isolation Panel [NHANES]    Frequency of Communication with Friends and Family: Patient declined    Frequency of Social Gatherings with Friends and Family: Patient declined    Attends Religious Services: Patient declined    Database administrator or Organizations: No    Attends Engineer, structural: Not on file    Marital Status: Patient declined   Family History  Problem Relation Age of Onset   Hypertension Mother    Diabetes Mother    Heart attack Mother    Hypertension Father    Heart attack Father     Heart attack Brother    Colon cancer Neg Hx    Outpatient Encounter Medications as of 11/07/2023  Medication Sig   cholecalciferol (VITAMIN D) 1000 UNITS tablet Take 1,000 Units by mouth every evening.   Continuous Glucose Sensor (FREESTYLE LIBRE 3 SENSOR) MISC Place 1 sensor on the skin every 14 days. Use to check glucose continuously   dexamethasone (DECADRON) 1 MG tablet Take 1 tablet (1 mg total) by mouth daily with breakfast.   docusate sodium (COLACE) 100 MG capsule Take 100 mg by mouth daily.   folic acid (FOLVITE) 1 MG tablet Take 1 tablet (1 mg total) by mouth daily.   Krill Oil 300 MG CAPS Take 300 mg by mouth every evening.   levothyroxine (SYNTHROID) 88 MCG tablet Take 1 tablet (88 mcg total) by mouth daily before breakfast.   loratadine (CLARITIN) 10 MG tablet Take 10 mg by mouth every evening.    losartan (COZAAR) 50 MG tablet Take 1.5 tablets (75 mg total) by mouth daily.   mirtazapine (REMERON) 15 MG tablet Take 1 tablet (15 mg total) by mouth at bedtime.   Multiple Vitamins-Minerals (MULTIVITAMIN GUMMIES ADULT PO) Take 2 each by mouth daily.   pantoprazole (PROTONIX) 40 MG tablet Take 1 tablet (40 mg total) by mouth every evening.   PRESCRIPTION MEDICATION Testosterone injection   prochlorperazine (COMPAZINE) 10 MG tablet Take 1 tablet (10 mg total) by mouth every 6 (six) hours as needed for nausea or vomiting.   rosuvastatin (CRESTOR) 20 MG tablet Take 1 tablet (20 mg total) by mouth daily.   Semaglutide,0.25 or 0.5MG /DOS, (OZEMPIC, 0.25 OR 0.5 MG/DOSE,) 2 MG/3ML SOPN Inject 0.5 mg into the skin once a week.   SYRINGE-NEEDLE, DISP, 3 ML (B-D 3CC LUER-LOK SYR 21GX1-1/2) 21G X 1-1/2" 3 ML MISC Use to inject testosterone every week   testosterone cypionate (DEPOTESTOTERONE CYPIONATE) 100 MG/ML injection Take 50mg  ( 0.38ml) and 100mg  ( 1ml) into muscle alternatively weekly.   venlafaxine XR (EFFEXOR-XR) 75 MG 24 hr capsule Take 1 capsule (75 mg total) by mouth daily with  breakfast. (Patient taking differently: Take 2 capsules by mouth daily with breakfast.)   No facility-administered encounter medications on file as of 11/07/2023.   ALLERGIES: No Known Allergies  VACCINATION STATUS: Immunization History  Administered Date(s) Administered   Fluad Quad(high Dose 65+) 07/28/2022   Hepatitis B 02/06/1990, 10/28/1990, 04/02/1991   Influenza,inj,Quad PF,6+ Mos 07/07/2019, 07/31/2021   Influenza-Unspecified 07/25/2018   Moderna Sars-Covid-2 Vaccination 12/01/2019, 12/30/2019, 10/19/2020   Pneumococcal Conjugate-13 03/11/2017   Tdap 01/17/2016    HPI Marcial Pacas D  Dunford is 68 y.o. male who presents today for follow-up after he was seen in consultation for multiple medical problems as follows.  See notes from previous visits. History is obtained directly from the patient as well as chart review. He is accompanied by his wife.  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   He is status post radiosurgery of CNS metastasis and chemotherapy.  His chemotherapy  involved high-dose steroids in the form of dexamethasone with subsequent hypopituitarism,  currently on dexamethasone 2 mg p.o. daily.      His labs showed hypogonadism, partial adrenal insufficiency, and hypothyroidism.  For this endocrine deficits, he was started on testosterone injection 50 mg 100 mg IM in alternate weeks.  His previsit labs show total testosterone stable at 508.  Has no new complaints today.  He remains on dexamethasone 1 mg p.o. daily in the morning for glucocorticoid deficit.   He denies prior testicular injury.  He denies testicular radiation . His previsit thyroid function tests are consistent with appropriate replacement, currently on levothyroxine 88 mcg p.o. daily before breakfast.     He wishes to be continued on testosterone replacement therapy as well as his other hormones.  Continues to gain weight progressively.  He continues  to smoke. He is back to enjoy his outdoor activities including golfing.    Review of Systems  Constitutional: + Stable weight,   + fatigue, + subjective hypothermia, + low libido  Objective:       11/07/2023   10:04 AM 10/29/2023   10:11 AM 10/29/2023   10:10 AM  Vitals with BMI  Height 5\' 11"   5\' 11"   Weight 182 lbs  184 lbs 14 oz  BMI 25.4  25.8  Systolic 134 153 161  Diastolic 84 91 82  Pulse 96  95    BP 134/84   Pulse 96   Ht 5\' 11"  (1.803 m)   Wt 182 lb (82.6 kg)   BMI 25.38 kg/m   Wt Readings from Last 3 Encounters:  11/07/23 182 lb (82.6 kg)  10/29/23 184 lb 14.4 oz (83.9 kg)  10/24/23 182 lb 12.8 oz (82.9 kg)    Physical Exam  Constitutional:  Body mass index is 25.38 kg/m.,  not in acute distress, normal state of mind Eyes: PERRLA, EOMI, no exophthalmos ENT: moist mucous membranes, no gross thyromegaly, no gross cervical lymphadenopathy   CMP ( most recent) CMP     Component Value Date/Time   NA 143 10/29/2023 0939   NA 145 (H) 06/07/2023 0825   K 4.1 10/29/2023 0939   CL 106 10/29/2023 0939   CO2 29 10/29/2023 0939   GLUCOSE 86 10/29/2023 0939   BUN 14 10/29/2023 0939   BUN 19 06/07/2023 0825   CREATININE 1.02 10/29/2023 0939   CALCIUM 9.2 10/29/2023 0939   PROT 6.8 10/29/2023 0939   PROT 6.2 06/07/2023 0825   ALBUMIN 4.0 10/29/2023 0939   ALBUMIN 3.9 06/07/2023 0825   AST 18 10/29/2023 0939   ALT 14 10/29/2023 0939   ALKPHOS 65 10/29/2023 0939   BILITOT 0.3 10/29/2023 0939   GFRNONAA >60 10/29/2023 0939   GFRAA 82 07/14/2020 0945     Diabetic Labs (most recent): Lab Results  Component Value Date   HGBA1C 5.5 10/24/2023   HGBA1C 6.0 (H) 04/16/2023   HGBA1C 6.5 (H) 10/16/2022   MICROALBUR neg 07/13/2014     Lipid Panel ( most recent) Lipid Panel  Component Value Date/Time   CHOL 103 10/25/2023 0907   CHOL 114 06/07/2023 0825   TRIG 158 (H) 10/25/2023 0907   TRIG 87 10/18/2016 0851   HDL 37 (L) 10/25/2023 0907   HDL 31  (L) 06/07/2023 0825   HDL 37 (L) 10/18/2016 0851   CHOLHDL 2.8 10/25/2023 0907   VLDL 32 10/25/2023 0907   LDLCALC 34 10/25/2023 0907   LDLCALC 43 06/07/2023 0825   LDLCALC 53 07/13/2014 0925   LABVLDL 40 06/07/2023 0825      Lab Results  Component Value Date   TSH 0.210 (L) 10/29/2023   TSH 0.199 (L) 10/25/2023   TSH 0.133 (L) 10/08/2023   TSH 0.384 (L) 06/07/2023   TSH 0.768 04/29/2023   TSH 0.468 04/15/2023   TSH 0.839 03/26/2023   TSH 1.189 02/12/2023   TSH 0.802 12/12/2022   TSH 0.780 10/16/2022   FREET4 0.88 10/29/2023   FREET4 0.76 10/25/2023   FREET4 0.81 04/29/2023   FREET4 0.81 12/10/2022   FREET4 0.60 (L) 09/07/2022   FREET4 0.76 06/04/2022   FREET4 0.58 (L) 01/22/2022   FREET4 0.75 09/22/2021      Latest Reference Range & Units 01/22/22 08:18 01/22/22 08:20  Sex Horm Binding Glob, Serum 19.3 - 76.4 nmol/L  16.7 (L)  Testosterone 264 - 916 ng/dL  161 (L)  Testosterone Free 6.6 - 18.1 pg/mL  4.7 (L)  Testosterone-% Free 0.2 - 0.7 %  1.9 (H)  TSH 0.350 - 4.500 uIU/mL 1.858   Triiodothyronine,Free,Serum 2.0 - 4.4 pg/mL 2.0   T4,Free(Direct) 0.61 - 1.12 ng/dL 0.96 (L)   (L): Data is abnormally low (H): Data is abnormally high  Assessment & Plan:   1. Hypopituitarism  2.  Hypogonadism 3.hypothyroidism 4.partial adrenal insufficiency -He is accompanied by his wife to clinic.  I reviewed and discussed his recent lab findings with him.   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 -He is advised to continue Decadron 1 mg p.o. daily at breakfast.   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  His previsit thyroid function tests are improving, advised to continue levothyroxine 88 mcg p.o. daily before breakfast.  TSH may be unreliable in this patient, hence, we will use Free T4 exclusively for dose adjustment.     - We  discussed about the correct intake of his thyroid hormone, on empty stomach at fasting, with water, separated by at least 30 minutes from breakfast and other medications,  and separated by more than 4 hours from calcium, iron, multivitamins, acid reflux medications (PPIs). -Patient is made aware of the fact that thyroid hormone replacement is needed for life, dose to be adjusted by periodic monitoring of thyroid function tests.   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  He is advised to continue testosterone cypionate  IM  50 mg alternating with  100 mg intramuscularly weekly until next measurement.  This will give him a total of 300 mg of testosterone monthly.    -Treatment target for him will be between 250-350 ng per DL, with the aim being to help him with libido as well as possibly avoid further loss of skeletal muscle mass.  Regarding his type 2 diabetes, his recent A1c was 5.5%.  He is currently on Ozempic 0.5 mg subcutaneously weekly.   He was taken off of metformin recently.   The  patient was counseled on the dangers of tobacco use, and was advised to quit.  Reviewed strategies to maximize success, including removing cigarettes and smoking materials from environment.   - he is advised to maintain close follow up with his oncologist, neurologist, radiologist and PCP [name], FNP for primary care needs.   I spent  26  minutes in the care of the patient today including review of labs from Thyroid Function, CMP, and other relevant labs ; imaging/biopsy records (current and previous including abstractions from other facilities); face-to-face time discussing  his lab results and symptoms, medications doses, his options of short and long term treatment based on the latest standards of care / guidelines;   and documenting the encounter.  Jewel Baize Holz  participated in the discussions, expressed  understanding, and voiced agreement with the above plans.  All questions were answered to his satisfaction. he is encouraged to contact clinic should he have any questions or concerns prior to his return visit.   Follow up plan: Return in about 4 months (around 03/06/2024) for Fasting Labs  in AM B4 8.   Marquis Lunch, MD [facility] [address] [place], [place] [address] Phone: [phone]  Fax: [phone]     11/07/2023, 11:45 AM  This note was partially dictated with voice recognition software. Similar sounding words can be transcribed inadequately or may not  be corrected upon review.

## 2023-11-08 ENCOUNTER — Encounter: Payer: Self-pay | Admitting: Internal Medicine

## 2023-11-11 ENCOUNTER — Other Ambulatory Visit: Payer: Self-pay | Admitting: Internal Medicine

## 2023-11-11 ENCOUNTER — Ambulatory Visit (HOSPITAL_COMMUNITY)
Admission: RE | Admit: 2023-11-11 | Discharge: 2023-11-11 | Disposition: A | Discharge status: Home / Self Care | Payer: Commercial Managed Care - PPO | Source: Ambulatory Visit | Attending: Internal Medicine | Admitting: Internal Medicine

## 2023-11-11 DIAGNOSIS — C3490 Malignant neoplasm of unspecified part of unspecified bronchus or lung: Secondary | ICD-10-CM | POA: Diagnosis not present

## 2023-11-11 DIAGNOSIS — I700 Atherosclerosis of aorta: Secondary | ICD-10-CM | POA: Diagnosis not present

## 2023-11-11 DIAGNOSIS — N200 Calculus of kidney: Secondary | ICD-10-CM | POA: Diagnosis not present

## 2023-11-11 DIAGNOSIS — C3491 Malignant neoplasm of unspecified part of right bronchus or lung: Secondary | ICD-10-CM

## 2023-11-11 MED ORDER — IOHEXOL 300 MG/ML  SOLN
100.0000 mL | Freq: Once | INTRAMUSCULAR | Status: AC | PRN
  Administered 2023-11-11: 100 mL via INTRAVENOUS

## 2023-11-12 NOTE — Progress Notes (Deleted)
 [facility] OFFICE PROGRESS NOTE  Bradley Masters, FNP [address] [place] [address]  DIAGNOSIS: Stage IV (T1b, N3, M1C) non-small cell lung cancer, favoring adenocarcinoma presented with right upper lobe lung nodule in addition to right hilar, subcarinal and bilateral mediastinal as well as supraclavicular lymphadenopathy in addition to bone and brain metastasis diagnosed in June 2022.   PD-L1 expression 80%.     Molecular Studies:  Biomarker Findings Microsatellite status - MS-Stable Tumor Mutational Burden - 6 Muts/Mb Genomic Findings For a complete list of the genes assayed, please refer to the Appendix. KRAS G12C, amplification ATM S470* CCND1 amplification - equivocal? HGF amplification - equivocal? MYC amplification - equivocal? FGF19 amplification - equivocal? FGF3 amplification - equivocal? FGF4 amplification - equivocal? NFKBIA amplification NKX2-1 amplification RAD21 amplification - equivocal? [id number]*26 TERT promoter -124C>T TP53 rearrangement exon 9 7 Disease relevant genes with no reportable alterations: ALK, BRAF, EGFR, ERBB2, MET, RET, ROS1  PRIOR THERAPY: SRS to the metastatic brain lesions under the care of Dr. Kathrynn Running.  Last treatment on 05/04/2021.   CURRENT THERAPY: Palliative systemic chemotherapy with carboplatin for an AUC 5, Alimta 500 mg/m2 and, Keytruda 200 mg IV every 3 weeks.  First dose expected on 05/08/2021.  Status post 43 cycles.  Starting from cycle #5 he is on maintenance treatment with Alimta and Keytruda every 3 weeks.    INTERVAL HISTORY: Bradley Goodman 68 y.o. male returns to the clinic today for a follow-up visit accompanied by his wife. The patient was last seen by my colleague 3 weeks ago.  He is currently on treatment with Alimta and Keytruda. He tolerates well except for some mild fatigue after treatment. Today, he denies any fever, chills, or night sweats. Denies any dyspnea, cough, chest pain, or  hemoptysis.  Denies any nausea, vomiting, diarrhea, or constipation. He denies unusual headaches.  Denies rashes and skin changes except for easy bruising. He recently had a restaging CT scan performed. He is here today for evaluation and to review his scan results before undergoing cycle #44  MEDICAL HISTORY: Past Medical History:  Diagnosis Date   Cervical spondylolysis    Essential hypertension    GERD (gastroesophageal reflux disease)    History of kidney stones    History of migraine    Hyperlipidemia    Hypertension    PONV (postoperative nausea and vomiting)    Type 2 diabetes mellitus (HCC)     ALLERGIES:  has no known allergies.  MEDICATIONS:  Current Outpatient Medications  Medication Sig Dispense Refill   cholecalciferol (VITAMIN D) 1000 UNITS tablet Take 1,000 Units by mouth every evening.     Continuous Glucose Sensor (FREESTYLE LIBRE 3 SENSOR) MISC Place 1 sensor on the skin every 14 days. Use to check glucose continuously 6 each 3   dexamethasone (DECADRON) 1 MG tablet Take 1 tablet (1 mg total) by mouth daily with breakfast. 90 tablet 1   docusate sodium (COLACE) 100 MG capsule Take 100 mg by mouth daily.     folic acid (FOLVITE) 1 MG tablet Take 1 tablet (1 mg total) by mouth daily. 90 tablet 0   Krill Oil 300 MG CAPS Take 300 mg by mouth every evening.     levothyroxine (SYNTHROID) 88 MCG tablet Take 1 tablet (88 mcg total) by mouth daily before breakfast. 90 tablet 1   loratadine (CLARITIN) 10 MG tablet Take 10 mg by mouth every evening.      losartan (COZAAR) 50 MG tablet Take  1.5 tablets (75 mg total) by mouth daily. 135 tablet 1   mirtazapine (REMERON) 15 MG tablet Take 1 tablet (15 mg total) by mouth at bedtime. 90 tablet 0   Multiple Vitamins-Minerals (MULTIVITAMIN GUMMIES ADULT PO) Take 2 each by mouth daily.     pantoprazole (PROTONIX) 40 MG tablet Take 1 tablet (40 mg total) by mouth every evening. 90 tablet 1   PRESCRIPTION MEDICATION Testosterone  injection     prochlorperazine (COMPAZINE) 10 MG tablet Take 1 tablet (10 mg total) by mouth every 6 (six) hours as needed for nausea or vomiting. 30 tablet 0   rosuvastatin (CRESTOR) 20 MG tablet Take 1 tablet (20 mg total) by mouth daily. 90 tablet 1   Semaglutide,0.25 or 0.5MG /DOS, (OZEMPIC, 0.25 OR 0.5 MG/DOSE,) 2 MG/3ML SOPN Inject 0.5 mg into the skin once a week. 9 mL 0   SYRINGE-NEEDLE, DISP, 3 ML (B-D 3CC LUER-LOK SYR 21GX1-1/2) 21G X 1-1/2" 3 ML MISC Use to inject testosterone every week 100 each 2   testosterone cypionate (DEPOTESTOTERONE CYPIONATE) 100 MG/ML injection Take 50mg  ( 0.75ml) and 100mg  ( 1ml) into muscle alternatively weekly. 10 mL 1   venlafaxine XR (EFFEXOR-XR) 75 MG 24 hr capsule Take 1 capsule (75 mg total) by mouth daily with breakfast. (Patient taking differently: Take 2 capsules by mouth daily with breakfast.) 90 capsule 1   No current facility-administered medications for this visit.    SURGICAL HISTORY:  Past Surgical History:  Procedure Laterality Date   Bilateral inguinal hernia repair     BRONCHIAL NEEDLE ASPIRATION BIOPSY  04/05/2021   Procedure: BRONCHIAL NEEDLE ASPIRATION BIOPSIES;  Surgeon: Josephine Igo, DO;  Location: [facility];  Service: Pulmonary;;   COLONOSCOPY  01/23/2012   Procedure: COLONOSCOPY;  Surgeon: Corbin Ade, MD;  Location: [facility];  Service: Endoscopy;  Laterality: N/A;  9:30 AM   COLONOSCOPY N/A 10/11/2015   Procedure: COLONOSCOPY;  Surgeon: Corbin Ade, MD;  Location: [facility];  Service: Endoscopy;  Laterality: N/A;  830   COLONOSCOPY N/A 10/14/2019   Procedure: COLONOSCOPY;  Surgeon: Corbin Ade, MD;  Location: [facility];  Service: Endoscopy;  Laterality: N/A;  1:45   POLYPECTOMY  10/14/2019   Procedure: POLYPECTOMY;  Surgeon: Corbin Ade, MD;  Location: [facility];  Service: Endoscopy;;  ascending colon, descending colon   VIDEO BRONCHOSCOPY WITH ENDOBRONCHIAL ULTRASOUND N/A 04/05/2021   Procedure:  VIDEO BRONCHOSCOPY WITH ENDOBRONCHIAL ULTRASOUND;  Surgeon: Josephine Igo, DO;  Location: [facility];  Service: Pulmonary;  Laterality: N/A;    REVIEW OF SYSTEMS:   Review of Systems  Constitutional: Negative for appetite change, chills, fatigue, fever and unexpected weight change.  HENT:   Negative for mouth sores, nosebleeds, sore throat and trouble swallowing.   Eyes: Negative for eye problems and icterus.  Respiratory: Negative for cough, hemoptysis, shortness of breath and wheezing.   Cardiovascular: Negative for chest pain and leg swelling.  Gastrointestinal: Negative for abdominal pain, constipation, diarrhea, nausea and vomiting.  Genitourinary: Negative for bladder incontinence, difficulty urinating, dysuria, frequency and hematuria.   Musculoskeletal: Negative for back pain, gait problem, neck pain and neck stiffness.  Skin: Negative for itching and rash.  Neurological: Negative for dizziness, extremity weakness, gait problem, headaches, light-headedness and seizures.  Hematological: Negative for adenopathy. Does not bruise/bleed easily.  Psychiatric/Behavioral: Negative for confusion, depression and sleep disturbance. The patient is not nervous/anxious.     PHYSICAL EXAMINATION:  There were no vitals taken for this  visit.  ECOG PERFORMANCE STATUS: {CHL ONC ECOG ZO:[id number]}  Physical Exam  Constitutional: Oriented to person, place, and time and well-developed, well-nourished, and in no distress. No distress.  HENT:  Head: Normocephalic and atraumatic.  Mouth/Throat: Oropharynx is clear and moist. No oropharyngeal exudate.  Eyes: Conjunctivae are normal. Right eye exhibits no discharge. Left eye exhibits no discharge. No scleral icterus.  Neck: Normal range of motion. Neck supple.  Cardiovascular: Normal rate, regular rhythm, normal heart sounds and intact distal pulses.   Pulmonary/Chest: Effort normal and breath sounds normal. No respiratory distress. No wheezes. No  rales.  Abdominal: Soft. Bowel sounds are normal. Exhibits no distension and no mass. There is no tenderness.  Musculoskeletal: Normal range of motion. Exhibits no edema.  Lymphadenopathy:    No cervical adenopathy.  Neurological: Alert and oriented to person, place, and time. Exhibits normal muscle tone. Gait normal. Coordination normal.  Skin: Skin is warm and dry. No rash noted. Not diaphoretic. No erythema. No pallor.  Psychiatric: Mood, memory and judgment normal.  Vitals reviewed.  LABORATORY DATA: Lab Results  Component Value Date   WBC 13.2 (H) 10/29/2023   HGB 12.3 (L) 10/29/2023   HCT 41.3 10/29/2023   MCV 86.8 10/29/2023   PLT 341 10/29/2023      Chemistry      Component Value Date/Time   NA 143 10/29/2023 0939   NA 145 (H) 06/07/2023 0825   K 4.1 10/29/2023 0939   CL 106 10/29/2023 0939   CO2 29 10/29/2023 0939   BUN 14 10/29/2023 0939   BUN 19 06/07/2023 0825   CREATININE 1.02 10/29/2023 0939      Component Value Date/Time   CALCIUM 9.2 10/29/2023 0939   ALKPHOS 65 10/29/2023 0939   AST 18 10/29/2023 0939   ALT 14 10/29/2023 0939   BILITOT 0.3 10/29/2023 0939       RADIOGRAPHIC STUDIES:  No results found.   ASSESSMENT/PLAN:  This is a very pleasant 68 year old Caucasian male diagnosed with stage IV (T1b, N3, M1 C) non-small cell lung cancer, favoring adenocarcinoma.  The patient presented with a right upper lobe lung nodule in addition to right hilar, subcarinal, and bilateral mediastinal lymphadenopathy as well as supraclavicular lymphadenopathy.  The patient also has metastatic disease to the brain.  He was diagnosed in June 2022.  His PD-L1 expression is 80%.  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    The patient completed SRS to the brain lesions under the care of Dr. Kathrynn Running.  His last treatment was on 05/04/2021. 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     The patient is currently undergoing systemic chemotherapy with carboplatin for an AUC of 5, Alimta 500 mg per metered squared, Keytruda 200 mg IV every 3 weeks.  Status post 43 cycles. Starting from cycle #5, the patient started maintenance Alimta and Keytruda IV every 3 weeks.    The patient was seen with Dr. Arbutus Ped today.  Dr. Arbutus Ped personally and independently reviewed the scan and discussed results with the patient today.  The scan showed ***.  Dr. Arbutus Ped recommends ***    Labs were reviewed.  Recommend that he proceed with cycle #44 today as scheduled.   We will see him back for follow-up visit in 3 weeks for evaluation repeat blood work before undergoing cycle #45.   The patient was advised  to call immediately if she has any concerning symptoms in the interval. The patient voices understanding of current disease status and treatment options and is in agreement with the current care plan. All questions were answered. The patient knows to call the clinic with any problems, questions or concerns. We can certainly see the patient much sooner if necessary   No orders of the defined types were placed in this encounter.    I spent {CHL ONC TIME VISIT - WUJWJ:[id number]} counseling the patient face to face. The total time spent in the appointment was {CHL ONC TIME VISIT - OZHYQ:[id number]}.  Bradley Mcgovern L Kaedance Magos, PA-C 11/12/23

## 2023-11-17 ENCOUNTER — Inpatient Hospital Stay (HOSPITAL_COMMUNITY)
Admission: EM | Admit: 2023-11-17 | Discharge: 2023-11-23 | DRG: 389 | Disposition: A | Discharge status: Home / Self Care | Payer: Commercial Managed Care - PPO | Attending: Family Medicine | Admitting: Family Medicine

## 2023-11-17 DIAGNOSIS — R000 Tachycardia, unspecified: Secondary | ICD-10-CM | POA: Diagnosis not present

## 2023-11-17 DIAGNOSIS — D75838 Other thrombocytosis: Secondary | ICD-10-CM | POA: Diagnosis present

## 2023-11-17 DIAGNOSIS — E1165 Type 2 diabetes mellitus with hyperglycemia: Secondary | ICD-10-CM | POA: Diagnosis present

## 2023-11-17 DIAGNOSIS — I152 Hypertension secondary to endocrine disorders: Secondary | ICD-10-CM | POA: Diagnosis present

## 2023-11-17 DIAGNOSIS — Z794 Long term (current) use of insulin: Secondary | ICD-10-CM

## 2023-11-17 DIAGNOSIS — D72829 Elevated white blood cell count, unspecified: Secondary | ICD-10-CM | POA: Insufficient documentation

## 2023-11-17 DIAGNOSIS — I10 Essential (primary) hypertension: Secondary | ICD-10-CM | POA: Diagnosis present

## 2023-11-17 DIAGNOSIS — K219 Gastro-esophageal reflux disease without esophagitis: Secondary | ICD-10-CM | POA: Diagnosis present

## 2023-11-17 DIAGNOSIS — F17210 Nicotine dependence, cigarettes, uncomplicated: Secondary | ICD-10-CM | POA: Diagnosis present

## 2023-11-17 DIAGNOSIS — M4302 Spondylolysis, cervical region: Secondary | ICD-10-CM | POA: Diagnosis present

## 2023-11-17 DIAGNOSIS — D75839 Thrombocytosis, unspecified: Secondary | ICD-10-CM | POA: Insufficient documentation

## 2023-11-17 DIAGNOSIS — E11649 Type 2 diabetes mellitus with hypoglycemia without coma: Secondary | ICD-10-CM | POA: Diagnosis not present

## 2023-11-17 DIAGNOSIS — E1159 Type 2 diabetes mellitus with other circulatory complications: Secondary | ICD-10-CM | POA: Diagnosis present

## 2023-11-17 DIAGNOSIS — Z8249 Family history of ischemic heart disease and other diseases of the circulatory system: Secondary | ICD-10-CM

## 2023-11-17 DIAGNOSIS — E039 Hypothyroidism, unspecified: Secondary | ICD-10-CM | POA: Diagnosis present

## 2023-11-17 DIAGNOSIS — E782 Mixed hyperlipidemia: Secondary | ICD-10-CM | POA: Diagnosis present

## 2023-11-17 DIAGNOSIS — K56609 Unspecified intestinal obstruction, unspecified as to partial versus complete obstruction: Principal | ICD-10-CM | POA: Diagnosis present

## 2023-11-17 DIAGNOSIS — Z7985 Long-term (current) use of injectable non-insulin antidiabetic drugs: Secondary | ICD-10-CM

## 2023-11-17 DIAGNOSIS — F3340 Major depressive disorder, recurrent, in remission, unspecified: Secondary | ICD-10-CM

## 2023-11-17 DIAGNOSIS — Z79890 Hormone replacement therapy (postmenopausal): Secondary | ICD-10-CM

## 2023-11-17 DIAGNOSIS — Z79899 Other long term (current) drug therapy: Secondary | ICD-10-CM

## 2023-11-17 DIAGNOSIS — E876 Hypokalemia: Secondary | ICD-10-CM | POA: Diagnosis present

## 2023-11-17 DIAGNOSIS — C3491 Malignant neoplasm of unspecified part of right bronchus or lung: Secondary | ICD-10-CM | POA: Diagnosis present

## 2023-11-17 DIAGNOSIS — E1169 Type 2 diabetes mellitus with other specified complication: Secondary | ICD-10-CM | POA: Diagnosis present

## 2023-11-17 DIAGNOSIS — Z833 Family history of diabetes mellitus: Secondary | ICD-10-CM

## 2023-11-17 HISTORY — DX: Malignant (primary) neoplasm, unspecified: C80.1

## 2023-11-17 NOTE — ED Triage Notes (Signed)
 Pt with c/o abdominal pain, distention, bloating, and diarrhea x 24hrs.

## 2023-11-18 ENCOUNTER — Inpatient Hospital Stay (HOSPITAL_COMMUNITY): Payer: Commercial Managed Care - PPO

## 2023-11-18 ENCOUNTER — Ambulatory Visit: Payer: Commercial Managed Care - PPO | Admitting: Physician Assistant

## 2023-11-18 ENCOUNTER — Encounter (HOSPITAL_COMMUNITY): Payer: Self-pay | Admitting: Emergency Medicine

## 2023-11-18 ENCOUNTER — Emergency Department (HOSPITAL_COMMUNITY): Payer: Commercial Managed Care - PPO

## 2023-11-18 ENCOUNTER — Other Ambulatory Visit: Payer: Self-pay

## 2023-11-18 ENCOUNTER — Ambulatory Visit: Payer: Commercial Managed Care - PPO

## 2023-11-18 ENCOUNTER — Other Ambulatory Visit: Payer: Commercial Managed Care - PPO

## 2023-11-18 DIAGNOSIS — Z79890 Hormone replacement therapy (postmenopausal): Secondary | ICD-10-CM | POA: Diagnosis not present

## 2023-11-18 DIAGNOSIS — E039 Hypothyroidism, unspecified: Secondary | ICD-10-CM | POA: Diagnosis not present

## 2023-11-18 DIAGNOSIS — E1165 Type 2 diabetes mellitus with hyperglycemia: Secondary | ICD-10-CM | POA: Diagnosis not present

## 2023-11-18 DIAGNOSIS — K219 Gastro-esophageal reflux disease without esophagitis: Secondary | ICD-10-CM | POA: Diagnosis not present

## 2023-11-18 DIAGNOSIS — E11649 Type 2 diabetes mellitus with hypoglycemia without coma: Secondary | ICD-10-CM | POA: Diagnosis not present

## 2023-11-18 DIAGNOSIS — C3490 Malignant neoplasm of unspecified part of unspecified bronchus or lung: Secondary | ICD-10-CM

## 2023-11-18 DIAGNOSIS — N400 Enlarged prostate without lower urinary tract symptoms: Secondary | ICD-10-CM | POA: Diagnosis not present

## 2023-11-18 DIAGNOSIS — D75839 Thrombocytosis, unspecified: Secondary | ICD-10-CM

## 2023-11-18 DIAGNOSIS — R109 Unspecified abdominal pain: Secondary | ICD-10-CM | POA: Diagnosis not present

## 2023-11-18 DIAGNOSIS — E782 Mixed hyperlipidemia: Secondary | ICD-10-CM

## 2023-11-18 DIAGNOSIS — K56609 Unspecified intestinal obstruction, unspecified as to partial versus complete obstruction: Principal | ICD-10-CM | POA: Diagnosis present

## 2023-11-18 DIAGNOSIS — D72829 Elevated white blood cell count, unspecified: Secondary | ICD-10-CM

## 2023-11-18 DIAGNOSIS — Z833 Family history of diabetes mellitus: Secondary | ICD-10-CM | POA: Diagnosis not present

## 2023-11-18 DIAGNOSIS — Z79899 Other long term (current) drug therapy: Secondary | ICD-10-CM | POA: Diagnosis not present

## 2023-11-18 DIAGNOSIS — M4302 Spondylolysis, cervical region: Secondary | ICD-10-CM | POA: Diagnosis present

## 2023-11-18 DIAGNOSIS — C3491 Malignant neoplasm of unspecified part of right bronchus or lung: Secondary | ICD-10-CM | POA: Diagnosis not present

## 2023-11-18 DIAGNOSIS — R0602 Shortness of breath: Secondary | ICD-10-CM | POA: Diagnosis not present

## 2023-11-18 DIAGNOSIS — F17210 Nicotine dependence, cigarettes, uncomplicated: Secondary | ICD-10-CM | POA: Diagnosis present

## 2023-11-18 DIAGNOSIS — R079 Chest pain, unspecified: Secondary | ICD-10-CM | POA: Diagnosis not present

## 2023-11-18 DIAGNOSIS — Z7985 Long-term (current) use of injectable non-insulin antidiabetic drugs: Secondary | ICD-10-CM | POA: Diagnosis not present

## 2023-11-18 DIAGNOSIS — Z4682 Encounter for fitting and adjustment of non-vascular catheter: Secondary | ICD-10-CM | POA: Diagnosis not present

## 2023-11-18 DIAGNOSIS — I10 Essential (primary) hypertension: Secondary | ICD-10-CM | POA: Diagnosis not present

## 2023-11-18 DIAGNOSIS — E876 Hypokalemia: Secondary | ICD-10-CM | POA: Diagnosis present

## 2023-11-18 DIAGNOSIS — N200 Calculus of kidney: Secondary | ICD-10-CM | POA: Diagnosis not present

## 2023-11-18 DIAGNOSIS — Z794 Long term (current) use of insulin: Secondary | ICD-10-CM | POA: Diagnosis not present

## 2023-11-18 DIAGNOSIS — D75838 Other thrombocytosis: Secondary | ICD-10-CM | POA: Diagnosis not present

## 2023-11-18 DIAGNOSIS — Z8249 Family history of ischemic heart disease and other diseases of the circulatory system: Secondary | ICD-10-CM | POA: Diagnosis not present

## 2023-11-18 LAB — COMPREHENSIVE METABOLIC PANEL
ALT: 18 U/L (ref 0–44)
AST: 20 U/L (ref 15–41)
Albumin: 3.7 g/dL (ref 3.5–5.0)
Alkaline Phosphatase: 56 U/L (ref 38–126)
Anion gap: 11 (ref 5–15)
BUN: 20 mg/dL (ref 8–23)
CO2: 25 mmol/L (ref 22–32)
Calcium: 10 mg/dL (ref 8.9–10.3)
Chloride: 103 mmol/L (ref 98–111)
Creatinine, Ser: 1.27 mg/dL — ABNORMAL HIGH (ref 0.61–1.24)
GFR, Estimated: >60 mL/min (ref >60)
Glucose, Bld: 113 mg/dL — ABNORMAL HIGH (ref 70–99)
Potassium: 3.8 mmol/L (ref 3.5–5.1)
Sodium: 139 mmol/L (ref 135–145)
Total Bilirubin: 0.6 mg/dL (ref 0.0–1.2)
Total Protein: 7.3 g/dL (ref 6.5–8.1)

## 2023-11-18 LAB — CBC
HCT: 48.1 % (ref 39.0–52.0)
Hemoglobin: 14.1 g/dL (ref 13.0–17.0)
MCH: 25.4 pg — ABNORMAL LOW (ref 26.0–34.0)
MCHC: 29.3 g/dL — ABNORMAL LOW (ref 30.0–36.0)
MCV: 86.7 fL (ref 80.0–100.0)
Platelets: 404 10*3/uL — ABNORMAL HIGH (ref 150–400)
RBC: 5.55 MIL/uL (ref 4.22–5.81)
RDW: 20 % — ABNORMAL HIGH (ref 11.5–15.5)
WBC: 18.5 10*3/uL — ABNORMAL HIGH (ref 4.0–10.5)
nRBC: 0 % (ref 0.0–0.2)

## 2023-11-18 LAB — HIV ANTIBODY (ROUTINE TESTING W REFLEX): HIV Screen 4th Generation wRfx: NONREACTIVE

## 2023-11-18 LAB — URINALYSIS, ROUTINE W REFLEX MICROSCOPIC
Bilirubin Urine: NEGATIVE
Glucose, UA: NEGATIVE mg/dL
Hgb urine dipstick: NEGATIVE
Ketones, ur: NEGATIVE mg/dL
Leukocytes,Ua: NEGATIVE
Nitrite: NEGATIVE
Protein, ur: NEGATIVE mg/dL
Specific Gravity, Urine: 1.005 (ref 1.005–1.030)
pH: 5 (ref 5.0–8.0)

## 2023-11-18 LAB — HEMOGLOBIN A1C
Hgb A1c MFr Bld: 5.8 % — ABNORMAL HIGH (ref 4.8–5.6)
Mean Plasma Glucose: 119.76 mg/dL

## 2023-11-18 LAB — LIPASE, BLOOD: Lipase: 31 U/L (ref 11–51)

## 2023-11-18 MED ORDER — ONDANSETRON HCL 4 MG PO TABS: 4.0000 mg | ORAL_TABLET | Freq: Four times a day (QID) | ORAL | Status: DC | PRN

## 2023-11-18 MED ORDER — INSULIN ASPART 100 UNIT/ML IJ SOLN: 0.0000 [IU] | INTRAMUSCULAR | Status: DC

## 2023-11-18 MED ORDER — MORPHINE SULFATE (PF) 2 MG/ML IV SOLN: 2.0000 mg | INTRAVENOUS | Status: DC | PRN

## 2023-11-18 MED ORDER — IOHEXOL 300 MG/ML  SOLN
100.0000 mL | Freq: Once | INTRAMUSCULAR | Status: AC | PRN
  Administered 2023-11-18: 100 mL via INTRAVENOUS

## 2023-11-18 MED ORDER — ENOXAPARIN SODIUM 40 MG/0.4ML IJ SOSY
40.0000 mg | PREFILLED_SYRINGE | INTRAMUSCULAR | Status: DC
  Administered 2023-11-18 – 2023-11-22 (×5): 40 mg via SUBCUTANEOUS
  Filled 2023-11-18 (×5): qty 0.4

## 2023-11-18 MED ORDER — ONDANSETRON HCL 4 MG/2ML IJ SOLN: 4.0000 mg | Freq: Four times a day (QID) | INTRAMUSCULAR | Status: DC | PRN

## 2023-11-18 MED ORDER — MORPHINE SULFATE (PF) 2 MG/ML IV SOLN
1.0000 mg | Freq: Three times a day (TID) | INTRAVENOUS | Status: DC | PRN
  Administered 2023-11-20 – 2023-11-21 (×2): 1 mg via INTRAVENOUS
  Filled 2023-11-18 (×4): qty 1

## 2023-11-18 MED ORDER — SODIUM CHLORIDE 0.9 % IV SOLN: INTRAVENOUS | Status: AC

## 2023-11-18 MED ORDER — INSULIN ASPART 100 UNIT/ML IJ SOLN: 0.0000 [IU] | Freq: Three times a day (TID) | INTRAMUSCULAR | Status: DC

## 2023-11-18 MED ORDER — HYDRALAZINE HCL 20 MG/ML IJ SOLN: 10.0000 mg | Freq: Four times a day (QID) | INTRAMUSCULAR | Status: DC | PRN

## 2023-11-18 NOTE — Progress Notes (Signed)
 Patient has the free style Libre,he has a device that he wants to use to check his own blood glucose,he doesn't want to be stuck.Dr Evette Hoes notified.Plan of care on going.

## 2023-11-18 NOTE — Progress Notes (Signed)
 11/18/2023  0440 Received pt to room 304 from ED with DX Small Bowel Obstruction.  Noted vented NGT to the L nares that is clamped.  Waiting on Xray results for placement confirmation before use.  Pt is A&O, no C/O pain at this time.  Oriented to room, call light and bed.  Call bell in reach, family at bedside. Carleene Chase

## 2023-11-18 NOTE — H&P (Signed)
 History and Physical    Patient: Bradley Goodman EAV:[id number] DOB: 1956/07/22 DOA: 11/17/2023 DOS: the patient was seen and examined on 11/18/2023 PCP: [name], FNP  Patient coming from: Home  Chief Complaint:  Chief Complaint  Patient presents with   Abdominal Pain   Diarrhea   HPI: Bradley Goodman is a 67 y.o. male with medical history significant of hypertension, hyperlipidemia, hypothyroidism, type 2 diabetes mellitus, adenocarcinoma of right lung who presents to the emergency department due to diarrhea, mid abdominal pain which was of moderate intensity, abdominal distention and bloating which started within the last 24 hours.  Patient has a remote history of small bowel obstruction.  Abdominal pain resolved while in the ED.  Patient denies nausea, vomiting, fever  ED Course:  In the emergency department, pulse on arrival was 101 bpm, other vital signs were within normal range.  Workup in the ED showed leukocytosis and thrombocytosis.  BMP was normal except for blood glucose of 113 and creatinine of 1.27, lipase was 31, urinalysis was normal. CT abdomen and pelvis with contrast showed small bowel obstruction with an abrupt transition point within the left lower quadrant. NG tube was placed.  Hospitalist was asked to admit patient for further evaluation and management.  Review of Systems: Review of systems as noted in the HPI. All other systems reviewed and are negative.   Past Medical History:  Diagnosis Date   Cancer (HCC)    Cervical spondylolysis    Essential hypertension    GERD (gastroesophageal reflux disease)    History of kidney stones    History of migraine    Hyperlipidemia    Hypertension    PONV (postoperative nausea and vomiting)    Type 2 diabetes mellitus (HCC)    Past Surgical History:  Procedure Laterality Date   Bilateral inguinal hernia repair     BRONCHIAL NEEDLE ASPIRATION BIOPSY  04/05/2021   Procedure: BRONCHIAL NEEDLE ASPIRATION BIOPSIES;   Surgeon: Prudy Brownie, DO;  Location: [facility];  Service: Pulmonary;;   COLONOSCOPY  01/23/2012   Procedure: COLONOSCOPY;  Surgeon: Suzette Espy, MD;  Location: [facility];  Service: Endoscopy;  Laterality: N/A;  9:30 AM   COLONOSCOPY N/A 10/11/2015   Procedure: COLONOSCOPY;  Surgeon: Suzette Espy, MD;  Location: [facility];  Service: Endoscopy;  Laterality: N/A;  830   COLONOSCOPY N/A 10/14/2019   Procedure: COLONOSCOPY;  Surgeon: Suzette Espy, MD;  Location: [facility];  Service: Endoscopy;  Laterality: N/A;  1:45   POLYPECTOMY  10/14/2019   Procedure: POLYPECTOMY;  Surgeon: Suzette Espy, MD;  Location: [facility];  Service: Endoscopy;;  ascending colon, descending colon   VIDEO BRONCHOSCOPY WITH ENDOBRONCHIAL ULTRASOUND N/A 04/05/2021   Procedure: VIDEO BRONCHOSCOPY WITH ENDOBRONCHIAL ULTRASOUND;  Surgeon: Prudy Brownie, DO;  Location: [facility];  Service: Pulmonary;  Laterality: N/A;    Social History:  reports that he has been smoking cigarettes. He started smoking about 50 years ago. He has a 25 pack-year smoking history. He has never used smokeless tobacco. He reports that he does not currently use alcohol. He reports that he does not use drugs.   No Known Allergies  Family History  Problem Relation Age of Onset   Hypertension Mother    Diabetes Mother    Heart attack Mother    Hypertension Father    Heart attack Father    Heart attack Brother    Colon cancer Neg Hx  Prior to Admission medications   Medication Sig Start Date End Date Taking? Authorizing Provider  cholecalciferol  (VITAMIN D ) 1000 UNITS tablet Take 1,000 Units by mouth every evening.    [provider]  Continuous Glucose Sensor (FREESTYLE LIBRE 3 SENSOR) MISC Place 1 sensor on the skin every 14 days. Use to check glucose continuously 03/18/23   [name], FNP  dexamethasone  (DECADRON ) 1 MG tablet Take 1 tablet (1 mg total) by mouth daily with breakfast. 04/16/23    [name], Georgeann Kindred, FNP  docusate sodium  (COLACE) 100 MG capsule Take 100 mg by mouth daily.    [provider]  folic acid  (FOLVITE ) 1 MG tablet Take 1 tablet (1 mg total) by mouth daily. 09/09/23   [name], FNP  Krill Oil 300 MG CAPS Take 300 mg by mouth every evening.    [provider]  levothyroxine  (SYNTHROID ) 88 MCG tablet Take 1 tablet (88 mcg total) by mouth daily before breakfast. 05/07/23   Nida, Gebreselassie W, MD  loratadine  (CLARITIN ) 10 MG tablet Take 10 mg by mouth every evening.     [provider]  losartan  (COZAAR ) 50 MG tablet Take 1.5 tablets (75 mg total) by mouth daily. 04/16/23   [name], FNP  mirtazapine  (REMERON ) 15 MG tablet Take 1 tablet (15 mg total) by mouth at bedtime. 10/28/23   [name], FNP  Multiple Vitamins-Minerals (MULTIVITAMIN GUMMIES ADULT PO) Take 2 each by mouth daily.    [provider]  pantoprazole  (PROTONIX ) 40 MG tablet Take 1 tablet (40 mg total) by mouth every evening. 10/24/23   [name], FNP  PRESCRIPTION MEDICATION Testosterone  injection    [provider]  prochlorperazine  (COMPAZINE ) 10 MG tablet Take 1 tablet (10 mg total) by mouth every 6 (six) hours as needed for nausea or vomiting. 04/25/21   Marlene Simas, MD  rosuvastatin  (CRESTOR ) 20 MG tablet Take 1 tablet (20 mg total) by mouth daily. 10/24/23   [name], FNP  Semaglutide ,0.25 or 0.5MG /DOS, (OZEMPIC , 0.25 OR 0.5 MG/DOSE,) 2 MG/3ML SOPN Inject 0.5 mg into the skin once a week. 07/15/23   [name], Georgeann Kindred, FNP  SYRINGE-NEEDLE, DISP, 3 ML (B-D 3CC LUER-LOK SYR 21GX1-1/2) 21G X 1-1/2" 3 ML MISC Use to inject testosterone  every week 09/02/23   Nida, Gebreselassie W, MD  testosterone  cypionate (DEPOTESTOTERONE CYPIONATE) 100 MG/ML injection Take 50mg  ( 0.49ml) and 100mg  ( 1ml) into muscle alternatively weekly. 05/07/23   Nida, Gebreselassie W, MD  venlafaxine  XR (EFFEXOR -XR) 75 MG 24 hr capsule Take 1 capsule (75 mg total) by mouth  daily with breakfast. Patient taking differently: Take 2 capsules by mouth daily with breakfast. 10/24/23   [name], FNP    Physical Exam: BP 116/84 (BP Location: Left Arm)   Pulse (!) 101   Temp 98.5 F (36.9 C) (Oral)   Resp 17   Ht 5\' 11"  (1.803 m)   Wt 83 kg   SpO2 94%   BMI 25.52 kg/m   General: 68 y.o. year-old male well developed well nourished in no acute distress.  Alert and oriented x3. HEENT: NCAT, EOMI Neck: Supple, trachea medial Cardiovascular: Regular rate and rhythm with no rubs or gallops.  No thyromegaly or JVD noted.  No lower extremity edema. 2/4 pulses in all 4 extremities. Respiratory: Clear to auscultation with no wheezes or rales. Good inspiratory effort. Abdomen: Soft, nontender nondistended with normal bowel sounds x4 quadrants. Muskuloskeletal: No cyanosis, clubbing or edema noted  bilaterally Neuro: CN II-XII intact, strength 5/5 x 4, sensation, reflexes intact Skin: No ulcerative lesions noted or rashes Psychiatry: Judgement and insight appear normal. Mood is appropriate for condition and setting          Labs on Admission:  Basic Metabolic Panel: Recent Labs  Lab 11/18/23 0014  NA 139  K 3.8  CL 103  CO2 25  GLUCOSE 113*  BUN 20  CREATININE 1.27*  CALCIUM  10.0   Liver Function Tests: Recent Labs  Lab 11/18/23 0014  AST 20  ALT 18  ALKPHOS 56  BILITOT 0.6  PROT 7.3  ALBUMIN 3.7   Recent Labs  Lab 11/18/23 0014  LIPASE 31   No results for input(s): "AMMONIA" in the last 168 hours. CBC: Recent Labs  Lab 11/18/23 0014  WBC 18.5*  HGB 14.1  HCT 48.1  MCV 86.7  PLT 404*   Cardiac Enzymes: No results for input(s): "CKTOTAL", "CKMB", "CKMBINDEX", "TROPONINI" in the last 168 hours.  BNP (last 3 results) No results for input(s): "BNP" in the last 8760 hours.  ProBNP (last 3 results) No results for input(s): "PROBNP" in the last 8760 hours.  CBG: No results for input(s): "GLUCAP" in the last 168  hours.  Radiological Exams on Admission: DG Abd Portable 1 View Result Date: 11/18/2023 CLINICAL DATA:  67 year old male status post nasogastric tube placement. EXAM: PORTABLE ABDOMEN - 1 VIEW COMPARISON:  CT of the abdomen and pelvis 11/18/2023. FINDINGS: Tip of nasogastric tube is in the distal esophagus shortly before the gastroesophageal junction. Numerous gas-filled loops of small bowel are noted throughout the central abdomen measuring up to 5.1 cm in diameter. Paucity of colonic gas. IMPRESSION: Tip of nasogastric tube is in the distal esophagus and should be advanced approximately 15 cm for more optimal placement. Electronically Signed   By: Alexandria Angel M.D.   On: 11/18/2023 05:25   CT ABDOMEN PELVIS W CONTRAST Result Date: 11/18/2023 CLINICAL DATA:  Abdominal pain and diarrhea. EXAM: CT ABDOMEN AND PELVIS WITH CONTRAST TECHNIQUE: Multidetector CT imaging of the abdomen and pelvis was performed using the standard protocol following bolus administration of intravenous contrast. RADIATION DOSE REDUCTION: This exam was performed according to the departmental dose-optimization program which includes automated exposure control, adjustment of the mA and/or kV according to patient size and/or use of iterative reconstruction technique. CONTRAST:  OMNIPAQUE  IOHEXOL  300 MG/ML  SOLN COMPARISON:  November 11, 2023 FINDINGS: Lower chest: There is a small, stable pericardial effusion. Hepatobiliary: A small, stable hepatic cyst is seen within the right lobe of the liver. No gallstones, gallbladder wall thickening, or biliary dilatation. Pancreas: Unremarkable. No pancreatic ductal dilatation or surrounding inflammatory changes. Spleen: Normal in size without focal abnormality. Adrenals/Urinary Tract: Adrenal glands are unremarkable. Kidneys are normal in size, without obstructing renal calculi, focal lesion, or hydronephrosis. A 9 mm cluster of nonobstructing renal calculi are seen within the left kidney.  Bladder is unremarkable. Stomach/Bowel: Stomach is within normal limits. Appendix appears normal. Multiple dilated small bowel loops are seen throughout the abdomen and pelvis (maximum small bowel diameter of approximately 3.7 cm). An abrupt transition joint is seen within the left lower quadrant (best seen on coronal reformatted images 55 through 59, CT series 5). Vascular/Lymphatic: Aortic atherosclerosis. No enlarged abdominal or pelvic lymph nodes. Reproductive: There is mild to moderate severity prostate gland enlargement and prostate calcification. Other: No abdominal wall hernia or abnormality. No abdominopelvic ascites. Musculoskeletal: No acute or significant osseous findings. IMPRESSION: 1. Small bowel obstruction  with an abrupt transition point within the left lower quadrant. 2. Small, stable pericardial effusion. 3. Nonobstructing left renal calculi. 4. Mild to moderate severity prostate gland enlargement. 5. Aortic atherosclerosis. Aortic Atherosclerosis (ICD10-I70.0). Electronically Signed   By: Virgle Grime M.D.   On: 11/18/2023 01:57    EKG: I independently viewed the EKG done and my findings are as followed: Sinus tachycardia at a rate of 102 bpm  Assessment/Plan Present on Admission:  Small bowel obstruction (HCC)  Essential hypertension  Mixed hyperlipidemia  Acquired hypothyroidism  Adenocarcinoma of right lung, stage 4 (HCC)  Type 2 diabetes mellitus with hyperglycemia, without long-term current use of insulin  (HCC)  Principal Problem:   Small bowel obstruction (HCC) Active Problems:   Essential hypertension   Mixed hyperlipidemia   Adenocarcinoma of right lung, stage 4 (HCC)   Acquired hypothyroidism   Type 2 diabetes mellitus with hyperglycemia, without long-term current use of insulin  (HCC)   Leukocytosis   Thrombocytosis   Small bowel obstruction Continue NG tube  Continue NPO at this time with plan to advance diet as tolerated Continue IV  hydration Continue IV morphine  2 mg q.4h p.r.n. for moderate/severe pain Continue Zofran  p.r.n. for nausea/vomiting General Surgery was consulted and we shall await further recommendations  Leukocytosis, thrombocytosis possibly reactive Continue to monitor CBC with morning labs  Essential hypertension Continue IV hydralazine  10 mg every 6 as needed for SBP greater than 170 Consider starting patient's home med when able to resume oral intake  Mixed hyperlipidemia Consider starting patient's home med when able to resume oral intake  Acquired hypothyroidism Consider starting patient's home med when able to resume oral intake  Type 2 diabetes mellitus without long-term use of insulin  Hemoglobin A1c on 04/16/2023 was 6.0 Continue ISS and hypoglycemia protocol  Adenocarcinoma of lung, stage IV Patient has stage IV (T1b, N3, M1 C) non-small cell lung cancer  He follows with Dr. Liam Redhead and is currently on maintenance therapy with Alimta  and Keytruda  every three weeks. Completed 43 cycles He follows with Dr. Liam Redhead and last office visit was 10/29/2023  DVT prophylaxis: Lovenox   Code Status: Full code  Family Communication: Wife at bedside (all questions answered to satisfaction)  Consults:   Severity of Illness: The appropriate patient status for this patient is INPATIENT. Inpatient status is judged to be reasonable and necessary in order to provide the required intensity of service to ensure the patient's safety. The patient's presenting symptoms, physical exam findings, and initial radiographic and laboratory data in the context of their chronic comorbidities is felt to place them at high risk for further clinical deterioration. Furthermore, it is not anticipated that the patient will be medically stable for discharge from the hospital within 2 midnights of admission.   * I certify that at the point of admission it is my clinical judgment that the patient will require inpatient  hospital care spanning beyond 2 midnights from the point of admission due to high intensity of service, high risk for further deterioration and high frequency of surveillance required.*  Author: Kyana Aicher, DO 11/18/2023 9:32 AM  For on call review [email or web address].

## 2023-11-18 NOTE — Progress Notes (Signed)
 Nurse at bedside,patient alert and oriented times four.No c/o pain or discomfort noted .Plan of care on going. Family at bedside.

## 2023-11-18 NOTE — Progress Notes (Signed)
   11/18/23 1021  TOC Brief Assessment  Insurance and Status Reviewed  Patient has primary care physician Yes  Home environment has been reviewed from home  Prior level of function: independent  Prior/Current Home Services No current home services  Social Drivers of Health Review SDOH reviewed no interventions necessary  Readmission risk has been reviewed Yes  Transition of care needs no transition of care needs at this time     Transition of Care Department [facility]) has reviewed patient and no TOC needs have been identified at this time. We will continue to monitor patient advancement through interdisciplinary progression rounds. If new patient transition needs arise, please place a TOC consult.

## 2023-11-18 NOTE — Progress Notes (Addendum)
  INTERVAL PROGRESS NOTE    Bradley Goodman- 68 y.o. male  LOS: 0 __________________________________________________________________  SUBJECTIVE: Admitted 11/17/2023 with cc of  Chief Complaint  Patient presents with   Abdominal Pain   Diarrhea   Since admission, NG tube placed.  OBJECTIVE: Blood pressure 129/85, pulse (!) 107, temperature 99 F (37.2 C), temperature source Oral, resp. rate 19, height 5\' 11"  (1.803 m), weight 83 kg, SpO2 96%.  ASSESSMENT/PLAN:  I have reviewed the full H&P by Dr. Russel Goodman, and I agree with the assessment and plan as outlined therein. In addition:  SBO: NG tube placed to intermittent suction.  Decrease opioid pain medications Awaiting general surgery consult.   Type II DM: no home insulin . Weekly semaglutide  Patient requests to use his continuous glucose monitor in place of finger sticks for glucose monitoring. Stop sliding scale insulin  and monitor TID   Principal Problem:   Small bowel obstruction (HCC) Active Problems:   Essential hypertension   Mixed hyperlipidemia   Adenocarcinoma of right lung, stage 4 (HCC)   Acquired hypothyroidism   Type 2 diabetes mellitus with hyperglycemia, without long-term current use of insulin  (HCC)   Leukocytosis   Thrombocytosis      Bradley Candy, DO [facility] 11/18/2023, 3:09 PM    [email or web address] Available by Epic secure chat 7AM-7PM. If 7PM-7AM, please contact night-coverage   No Charge

## 2023-11-18 NOTE — ED Provider Notes (Signed)
 [facility]-EMERGENCY DEPT [facility] Emergency Department Provider Note MRN:  [id number]  Arrival date & time: 11/18/23     Chief Complaint   Abdominal Pain and Diarrhea   History of Present Illness   Bradley Goodman is a 68 y.o. year-old male with a history of SBO presenting to the ED with chief complaint of abdominal pain.  Diarrhea for the past 2 days, this evening with some abdominal bloating and distention, discomfort.  History of SBO and so here for evaluation.  Seems to be feeling better over the past 30 minutes or so, no abdominal pain at this time.  Denies fever.  No nausea or vomiting.  Review of Systems  A thorough review of systems was obtained and all systems are negative except as noted in the HPI and PMH.   Patient's Health History    Past Medical History:  Diagnosis Date   Cancer [facility])    Cervical spondylolysis    Essential hypertension    GERD (gastroesophageal reflux disease)    History of kidney stones    History of migraine    Hyperlipidemia    Hypertension    PONV (postoperative nausea and vomiting)    Type 2 diabetes mellitus (HCC)     Past Surgical History:  Procedure Laterality Date   Bilateral inguinal hernia repair     BRONCHIAL NEEDLE ASPIRATION BIOPSY  04/05/2021   Procedure: BRONCHIAL NEEDLE ASPIRATION BIOPSIES;  Surgeon: Prudy Brownie, DO;  [facility];  Service: Pulmonary;;   COLONOSCOPY  01/23/2012   Procedure: COLONOSCOPY;  Surgeon: Suzette Espy, MD;  Location[facility];  Service: Endoscopy;  Laterality: N/A;  9:30 AM   COLONOSCOPY N/A 10/11/2015   Procedure: COLONOSCOPY;  Surgeon: Suzette Espy, MD;  [facility];  Service: Endoscopy;  Laterality: N/A;  830   COLONOSCOPY N/A 10/14/2019   Procedure: COLONOSCOPY;  Surgeon: Suzette Espy, MD;  [facility];  Service: Endoscopy;  Laterality: N/A;  1:45   POLYPECTOMY  10/14/2019   Procedure: POLYPECTOMY;  Surgeon: Suzette Espy, MD;  [facility];  Service: Endoscopy;;  ascending colon, descending colon   VIDEO BRONCHOSCOPY WITH ENDOBRONCHIAL ULTRASOUND N/A 04/05/2021   Procedure: VIDEO BRONCHOSCOPY WITH ENDOBRONCHIAL ULTRASOUND;  Surgeon: Prudy Brownie, DO;  [facility];  Service: Pulmonary;  Laterality: N/A;    Family History  Problem Relation Age of Onset   Hypertension Mother    Diabetes Mother    Heart attack Mother    Hypertension Father    Heart attack Father    Heart attack Brother    Colon cancer Neg Hx     Social History   Socioeconomic History   Marital status: Married    Spouse name: Not on file   Number of children: Not on file   Years of education: Not on file   Highest education level: Bachelor's degree (e.g., BA, AB, BS)  Occupational History   Not on file  Tobacco Use   Smoking status: Every Day    Current packs/day: 0.50    Average packs/day: 0.5 packs/day for 50.0 years (25.0 ttl pk-yrs)    Types: Cigarettes    Start date: 10/30/1973   Smokeless tobacco: Never  Vaping Use   Vaping status: Never Used  Substance and Sexual Activity   Alcohol use: Not Currently    Comment: One drink every 6 months.   Drug use: No   Sexual activity: Yes  Other Topics Concern  Not on file  Social History Narrative   Not on file   Social Drivers of Health   Financial Resource Strain: Patient Declined (10/14/2023)   Overall Financial Resource Strain (CARDIA)    Difficulty of Paying Living Expenses: Patient declined  Food Insecurity: Patient Declined (10/14/2023)   Hunger Vital Sign    Worried About Running Out of Food in the Last Year: Patient declined    Ran Out of Food in the Last Year: Patient declined  Transportation Needs: Patient Declined (10/14/2023)   PRAPARE - Administrator, Civil Service (Medical): Patient declined    Lack of Transportation (Non-Medical): Patient declined  Physical Activity: Unknown (10/14/2023)   Exercise Vital Sign    Days of Exercise per Week: 1 day     Minutes of Exercise per Session: Patient declined  Stress: No Stress Concern Present (10/14/2023)   [employer] of Occupational Health - Occupational Stress Questionnaire    Feeling of Stress : Not at all  Social Connections: Unknown (10/14/2023)   Social Connection and Isolation Panel [NHANES]    Frequency of Communication with Friends and Family: Patient declined    Frequency of Social Gatherings with Friends and Family: Patient declined    Attends Religious Services: Patient declined    Database administrator or Organizations: No    Attends Engineer, structural: Not on file    Marital Status: Patient declined  Intimate Partner Violence: Not on file     Physical Exam   Vitals:   11/18/23 0003 11/18/23 0130  BP: 130/81 113/83  Pulse: (!) 101 98  Resp: 18 16  Temp: 98 F (36.7 C)   SpO2: 97% 95%    CONSTITUTIONAL: Well-appearing, NAD NEURO/PSYCH:  Alert and oriented x 3, no focal deficits EYES:  eyes equal and reactive ENT/NECK:  no LAD, no JVD CARDIO: Regular rate, well-perfused, normal S1 and S2 PULM:  CTAB no wheezing or rhonchi GI/GU:  non-distended, non-tender MSK/SPINE:  No gross deformities, no edema SKIN:  no rash, atraumatic   *Additional and/or pertinent findings included in MDM below  Diagnostic and Interventional Summary    EKG Interpretation Date/Time:    Ventricular Rate:    PR Interval:    QRS Duration:    QT Interval:    QTC Calculation:   R Axis:      Text Interpretation:         Labs Reviewed  COMPREHENSIVE METABOLIC PANEL - Abnormal; Notable for the following components:      Result Value   Glucose, Bld 113 (*)    Creatinine, Ser 1.27 (*)    All other components within normal limits  CBC - Abnormal; Notable for the following components:   WBC 18.5 (*)    MCH 25.4 (*)    MCHC 29.3 (*)    RDW 20.0 (*)    Platelets 404 (*)    All other components within normal limits  LIPASE, BLOOD  URINALYSIS, ROUTINE W REFLEX  MICROSCOPIC    CT ABDOMEN PELVIS W CONTRAST  Final Result      Medications  iohexol  (OMNIPAQUE ) 300 MG/ML solution 100 mL (100 mLs Intravenous Contrast Given 11/18/23 0045)     Procedures  /  Critical Care .Critical Care  Performed by: Edson Graces, MD Authorized by: Edson Graces, MD   Critical care provider statement:    Critical care time (minutes):  45   Critical care was necessary to treat or prevent imminent or life-threatening deterioration of  the following conditions: Small bowel obstruction.   Critical care was time spent personally by me on the following activities:  Development of treatment plan with patient or surrogate, discussions with consultants, evaluation of patient's response to treatment, examination of patient, ordering and review of laboratory studies, ordering and review of radiographic studies, ordering and performing treatments and interventions, pulse oximetry, re-evaluation of patient's condition and review of old charts   ED Course and Medical Decision Making  Initial Impression and Ddx Differential diagnosis includes gastroenteritis, SBO, colitis.  Will obtain CT given patient's history.  Mild distention on exam  Past medical/surgical history that increases complexity of ED encounter: SBO  Interpretation of Diagnostics I personally reviewed the laboratory assessment and my interpretation is as follows: Leukocytosis otherwise no significant blood count or electrolyte disturbance, mild elevation in creatinine.  CT showing high-grade obstruction.    Patient Reassessment and Ultimate Disposition/Management     Plan is to consult general surgery and admit to medicine.  Placing NG tube.  Patient management required discussion with the following services or consulting groups:  Hospitalist Service and General/Trauma Surgery  Complexity of Problems Addressed Acute illness or injury that poses threat of life of bodily function  Additional Data Reviewed  and Analyzed Further history obtained from: Further history from spouse/family member  Additional Factors Impacting ED Encounter Risk Consideration of hospitalization  Merrick Abe. Harless Lien, MD [facility] Emergency Medicine [facility] [email or web address]  Final Clinical Impressions(s) / ED Diagnoses     ICD-10-CM   1. Small bowel obstruction (HCC)  K56.609       ED Discharge Orders     None        Discharge Instructions Discussed with and Provided to Patient:   Discharge Instructions   None      Edson Graces, MD 11/18/23 0201

## 2023-11-18 NOTE — Plan of Care (Signed)
   Problem: Education: Goal: Knowledge of General Education information will improve Description Including pain rating scale, medication(s)/side effects and non-pharmacologic comfort measures Outcome: Progressing   Problem: Education: Goal: Knowledge of General Education information will improve Description Including pain rating scale, medication(s)/side effects and non-pharmacologic comfort measures Outcome: Progressing

## 2023-11-18 NOTE — Progress Notes (Signed)
 Nurse at bedside,patient resting quietly.Plan of care on going.

## 2023-11-18 NOTE — ED Notes (Signed)
 ED TO INPATIENT HANDOFF REPORT  ED Nurse Name and Phone #: [phone]  S Name/Age/Gender Bradley Goodman 68 y.o. male Room/Bed: APA10/APA10  Code Status   Code Status: Prior  Home/SNF/Other Home Patient oriented to: self, place, time, and situation Is this baseline? Yes   Triage Complete: Triage complete  Chief Complaint Small bowel obstruction (HCC) [K56.609]  Triage Note Pt with c/o abdominal pain, distention, bloating, and diarrhea x 24hrs.    Allergies No Known Allergies  Level of Care/Admitting Diagnosis ED Disposition     ED Disposition  Admit   Condition  --   Comment  Hospital Area: [facility] [[id number]]  Level of Care: Med-Surg [16]  Covid Evaluation: Asymptomatic - no recent exposure (last 10 days) testing not required  Diagnosis: Small bowel obstruction [facility]) [[id number]]  Admitting Physician: ADEFESO, OLADAPO [[id number]]  Attending Physician: ADEFESO, OLADAPO [[id number]]  Certification:: I certify this patient will need inpatient services for at least 2 midnights  Expected Medical Readiness: 11/21/2023          B Medical/Surgery History Past Medical History:  Diagnosis Date   Cancer (HCC)    Cervical spondylolysis    Essential hypertension    GERD (gastroesophageal reflux disease)    History of kidney stones    History of migraine    Hyperlipidemia    Hypertension    PONV (postoperative nausea and vomiting)    Type 2 diabetes mellitus (HCC)    Past Surgical History:  Procedure Laterality Date   Bilateral inguinal hernia repair     BRONCHIAL NEEDLE ASPIRATION BIOPSY  04/05/2021   Procedure: BRONCHIAL NEEDLE ASPIRATION BIOPSIES;  Surgeon: Prudy Brownie, DO;  [facility];  Service: Pulmonary;;   COLONOSCOPY  01/23/2012   Procedure: COLONOSCOPY;  Surgeon: Suzette Espy, MD;  [facility];  Service: Endoscopy;  Laterality: N/A;  9:30 AM   COLONOSCOPY N/A 10/11/2015   Procedure: COLONOSCOPY;  Surgeon: Suzette Espy, MD;   [facility];  Service: Endoscopy;  Laterality: N/A;  830   COLONOSCOPY N/A 10/14/2019   Procedure: COLONOSCOPY;  Surgeon: Suzette Espy, MD;  [facility];  Service: Endoscopy;  Laterality: N/A;  1:45   POLYPECTOMY  10/14/2019   Procedure: POLYPECTOMY;  Surgeon: Suzette Espy, MD;  [facility];  Service: Endoscopy;;  ascending colon, descending colon   VIDEO BRONCHOSCOPY WITH ENDOBRONCHIAL ULTRASOUND N/A 04/05/2021   Procedure: VIDEO BRONCHOSCOPY WITH ENDOBRONCHIAL ULTRASOUND;  Surgeon: Prudy Brownie, DO;  [facility];  Service: Pulmonary;  Laterality: N/A;     A IV Location/Drains/Wounds Patient Lines/Drains/Airways Status     Active Line/Drains/Airways     Name Placement date Placement time Site Days   Peripheral IV 11/18/23 20 G 1.88" Anterior;Right;Upper Arm 11/18/23  0015  Arm  less than 1   NG/OG Vented/Dual Lumen 16 Fr. Right nare External length of tube 50 cm 11/18/23  0233  Right nare  less than 1            Intake/Output Last 24 hours No intake or output data in the 24 hours ending 11/18/23 0865  Labs/Imaging Results for orders placed or performed during the hospital encounter of 11/17/23 (from the past 48 hours)  Urinalysis, Routine w reflex microscopic -Urine, Clean Catch     Status: None   Collection Time: 11/18/23 12:02 AM  Result Value Ref Range   Color, Urine YELLOW YELLOW   APPearance CLEAR CLEAR   Specific Gravity, Urine  1.005 1.005 - 1.030   pH 5.0 5.0 - 8.0   Glucose, UA NEGATIVE NEGATIVE mg/dL   Hgb urine dipstick NEGATIVE NEGATIVE   Bilirubin Urine NEGATIVE NEGATIVE   Ketones, ur NEGATIVE NEGATIVE mg/dL   Protein, ur NEGATIVE NEGATIVE mg/dL   Nitrite NEGATIVE NEGATIVE   Leukocytes,Ua NEGATIVE NEGATIVE    Comment: Performed at [facility], [address] [place], [place] [address]  Lipase, blood     Status: None   Collection Time: 11/18/23 12:14 AM  Result Value Ref Range   Lipase 31 11 - 51 U/L     Comment: Performed at [facility], [address] [place], [place] [address]  Comprehensive metabolic panel     Status: Abnormal   Collection Time: 11/18/23 12:14 AM  Result Value Ref Range   Sodium 139 135 - 145 mmol/L   Potassium 3.8 3.5 - 5.1 mmol/L   Chloride 103 98 - 111 mmol/L   CO2 25 22 - 32 mmol/L   Glucose, Bld 113 (H) 70 - 99 mg/dL    Comment: Glucose reference range applies only to samples taken after fasting for at least 8 hours.   BUN 20 8 - 23 mg/dL   Creatinine, Ser 4.69 (H) 0.61 - 1.24 mg/dL   Calcium  10.0 8.9 - 10.3 mg/dL   Total Protein 7.3 6.5 - 8.1 g/dL   Albumin 3.7 3.5 - 5.0 g/dL   AST 20 15 - 41 U/L   ALT 18 0 - 44 U/L   Alkaline Phosphatase 56 38 - 126 U/L   Total Bilirubin 0.6 0.0 - 1.2 mg/dL   GFR, Estimated >62 >95 mL/min    Comment: (NOTE) Calculated using the CKD-EPI Creatinine Equation (2021)    Anion gap 11 5 - 15    Comment: Performed at [facility], [address], [place], [place] [address]  CBC     Status: Abnormal   Collection Time: 11/18/23 12:14 AM  Result Value Ref Range   WBC 18.5 (H) 4.0 - 10.5 K/uL   RBC 5.55 4.22 - 5.81 MIL/uL   Hemoglobin 14.1 13.0 - 17.0 g/dL   HCT 24.4 01.0 - 27.2 %   MCV 86.7 80.0 - 100.0 fL   MCH 25.4 (L) 26.0 - 34.0 pg   MCHC 29.3 (L) 30.0 - 36.0 g/dL   RDW 53.6 (H) 64.4 - 03.4 %   Platelets 404 (H) 150 - 400 K/uL   nRBC 0.0 0.0 - 0.2 %    Comment: Performed at [facility], [address], [place], [place] [address]   CT ABDOMEN PELVIS W CONTRAST Result Date: 11/18/2023 CLINICAL DATA:  Abdominal pain and diarrhea. EXAM: CT ABDOMEN AND PELVIS WITH CONTRAST TECHNIQUE: Multidetector CT imaging of the abdomen and pelvis was performed using the standard protocol following bolus administration of intravenous contrast. RADIATION DOSE REDUCTION: This exam was performed according to the departmental dose-optimization program which includes automated exposure control, adjustment of the mA and/or kV according to patient  size and/or use of iterative reconstruction technique. CONTRAST:  OMNIPAQUE  IOHEXOL  300 MG/ML  SOLN COMPARISON:  November 11, 2023 FINDINGS: Lower chest: There is a small, stable pericardial effusion. Hepatobiliary: A small, stable hepatic cyst is seen within the right lobe of the liver. No gallstones, gallbladder wall thickening, or biliary dilatation. Pancreas: Unremarkable. No pancreatic ductal dilatation or surrounding inflammatory changes. Spleen: Normal in size without focal abnormality. Adrenals/Urinary Tract: Adrenal glands are unremarkable. Kidneys are normal in size, without obstructing renal calculi, focal lesion, or  hydronephrosis. A 9 mm cluster of nonobstructing renal calculi are seen within the left kidney. Bladder is unremarkable. Stomach/Bowel: Stomach is within normal limits. Appendix appears normal. Multiple dilated small bowel loops are seen throughout the abdomen and pelvis (maximum small bowel diameter of approximately 3.7 cm). An abrupt transition joint is seen within the left lower quadrant (best seen on coronal reformatted images 55 through 59, CT series 5). Vascular/Lymphatic: Aortic atherosclerosis. No enlarged abdominal or pelvic lymph nodes. Reproductive: There is mild to moderate severity prostate gland enlargement and prostate calcification. Other: No abdominal wall hernia or abnormality. No abdominopelvic ascites. Musculoskeletal: No acute or significant osseous findings. IMPRESSION: 1. Small bowel obstruction with an abrupt transition point within the left lower quadrant. 2. Small, stable pericardial effusion. 3. Nonobstructing left renal calculi. 4. Mild to moderate severity prostate gland enlargement. 5. Aortic atherosclerosis. Aortic Atherosclerosis (ICD10-I70.0). Electronically Signed   By: Virgle Grime M.D.   On: 11/18/2023 01:57    Pending Labs Unresulted Labs (From admission, onward)    None       Vitals/Pain Today's Vitals   11/18/23 0003 11/18/23 0130  11/18/23 0215 11/18/23 0300  BP: 130/81 113/83 128/86 (!) 135/90  Pulse: (!) 101 98 91 96  Resp: 18 16 14 17   Temp: 98 F (36.7 C)     TempSrc: Oral     SpO2: 97% 95% 95% 95%  Weight:      Height:      PainSc:        Isolation Precautions No active isolations  Medications Medications  iohexol  (OMNIPAQUE ) 300 MG/ML solution 100 mL (100 mLs Intravenous Contrast Given 11/18/23 0045)    Mobility walks       R Recommendations: See Admitting Provider Note  Report given to:   Additional Notes:

## 2023-11-19 DIAGNOSIS — K56609 Unspecified intestinal obstruction, unspecified as to partial versus complete obstruction: Secondary | ICD-10-CM | POA: Diagnosis not present

## 2023-11-19 LAB — COMPREHENSIVE METABOLIC PANEL
ALT: 13 U/L (ref 0–44)
AST: 14 U/L — ABNORMAL LOW (ref 15–41)
Albumin: 2.8 g/dL — ABNORMAL LOW (ref 3.5–5.0)
Alkaline Phosphatase: 48 U/L (ref 38–126)
Anion gap: 8 (ref 5–15)
BUN: 17 mg/dL (ref 8–23)
CO2: 22 mmol/L (ref 22–32)
Calcium: 8 mg/dL — ABNORMAL LOW (ref 8.9–10.3)
Chloride: 108 mmol/L (ref 98–111)
Creatinine, Ser: 0.96 mg/dL (ref 0.61–1.24)
GFR, Estimated: >60 mL/min (ref >60)
Glucose, Bld: 70 mg/dL (ref 70–99)
Potassium: 3.4 mmol/L — ABNORMAL LOW (ref 3.5–5.1)
Sodium: 138 mmol/L (ref 135–145)
Total Bilirubin: 0.5 mg/dL (ref 0.0–1.2)
Total Protein: 5.5 g/dL — ABNORMAL LOW (ref 6.5–8.1)

## 2023-11-19 LAB — CBC
HCT: 38.6 % — ABNORMAL LOW (ref 39.0–52.0)
Hemoglobin: 11.3 g/dL — ABNORMAL LOW (ref 13.0–17.0)
MCH: 25.1 pg — ABNORMAL LOW (ref 26.0–34.0)
MCHC: 29.3 g/dL — ABNORMAL LOW (ref 30.0–36.0)
MCV: 85.8 fL (ref 80.0–100.0)
Platelets: 300 10*3/uL (ref 150–400)
RBC: 4.5 MIL/uL (ref 4.22–5.81)
RDW: 19.7 % — ABNORMAL HIGH (ref 11.5–15.5)
WBC: 10.2 10*3/uL (ref 4.0–10.5)
nRBC: 0 % (ref 0.0–0.2)

## 2023-11-19 LAB — MAGNESIUM: Magnesium: 1.6 mg/dL — ABNORMAL LOW (ref 1.7–2.4)

## 2023-11-19 LAB — PHOSPHORUS: Phosphorus: 2.3 mg/dL — ABNORMAL LOW (ref 2.5–4.6)

## 2023-11-19 MED ORDER — MENTHOL 3 MG MT LOZG
1.0000 | LOZENGE | OROMUCOSAL | Status: DC | PRN
  Administered 2023-11-19 – 2023-11-21 (×6): 3 mg via ORAL
  Filled 2023-11-19 (×6): qty 9

## 2023-11-19 MED ORDER — DEXTROSE-SODIUM CHLORIDE 5-0.9 % IV SOLN: INTRAVENOUS | Status: AC

## 2023-11-19 MED ORDER — SODIUM CHLORIDE 0.9 % IV SOLN: INTRAVENOUS | Status: DC

## 2023-11-19 MED ORDER — DEXTROSE 50 % IV SOLN
INTRAVENOUS | Status: AC
  Administered 2023-11-19: 50 mL
  Filled 2023-11-19: qty 50

## 2023-11-19 NOTE — Plan of Care (Signed)
Problem: Education: Goal: Knowledge of General Education information will improve Description: Including pain rating scale, medication(s)/side effects and non-pharmacologic comfort measures Outcome: Progressing   Problem: Activity: Goal: Risk for activity intolerance will decrease Outcome: Progressing   Problem: Education: Goal: Ability to describe self-care measures that may prevent or decrease complications (Diabetes Survival Skills Education) will improve Outcome: Progressing

## 2023-11-19 NOTE — Consult Note (Signed)
Reason for Consult: Small bowel obstruction Referring Physician: Dr. Jessie Foot is an 68 y.o. male.  HPI: Patient is a 68 year old white male with a history of adenocarcinoma of the right lung, hypothyroidism, and type 2 diabetes who presented to the emergency room due to abdominal pain and diarrhea.  He also started having bloating over the past 24 hours.  He denies any nausea, vomiting, or fevers.  A CT scan of the abdomen pelvis showed a small bowel obstruction with a transition zone in the left lower quadrant.  An NG tube was placed.  This morning, he states he has no significant abdominal pain.  He last had a bowel movement yesterday.  He does occasionally pass some gas.  He states he may have had an episode of a bowel obstruction many years ago which resolved without abdominal surgery.  His only abdominal surgery has been bilateral inguinal hernia repair.  Past Medical History:  Diagnosis Date   Cancer (HCC)    Cervical spondylolysis    Essential hypertension    GERD (gastroesophageal reflux disease)    History of kidney stones    History of migraine    Hyperlipidemia    Hypertension    PONV (postoperative nausea and vomiting)    Type 2 diabetes mellitus (HCC)     Past Surgical History:  Procedure Laterality Date   Bilateral inguinal hernia repair     BRONCHIAL NEEDLE ASPIRATION BIOPSY  04/05/2021   Procedure: BRONCHIAL NEEDLE ASPIRATION BIOPSIES;  Surgeon: Josephine Igo, DO;  Location: [facility];  Service: Pulmonary;;   COLONOSCOPY  01/23/2012   Procedure: COLONOSCOPY;  Surgeon: Corbin Ade, MD;  Location: [facility];  Service: Endoscopy;  Laterality: N/A;  9:30 AM   COLONOSCOPY N/A 10/11/2015   Procedure: COLONOSCOPY;  Surgeon: Corbin Ade, MD;  Location: [facility];  Service: Endoscopy;  Laterality: N/A;  830   COLONOSCOPY N/A 10/14/2019   Procedure: COLONOSCOPY;  Surgeon: Corbin Ade, MD;  Location: [facility];  Service: Endoscopy;   Laterality: N/A;  1:45   POLYPECTOMY  10/14/2019   Procedure: POLYPECTOMY;  Surgeon: Corbin Ade, MD;  Location: [facility];  Service: Endoscopy;;  ascending colon, descending colon   VIDEO BRONCHOSCOPY WITH ENDOBRONCHIAL ULTRASOUND N/A 04/05/2021   Procedure: VIDEO BRONCHOSCOPY WITH ENDOBRONCHIAL ULTRASOUND;  Surgeon: Josephine Igo, DO;  Location: [facility];  Service: Pulmonary;  Laterality: N/A;    Family History  Problem Relation Age of Onset   Hypertension Mother    Diabetes Mother    Heart attack Mother    Hypertension Father    Heart attack Father    Heart attack Brother    Colon cancer Neg Hx     Social History:  reports that he has been smoking cigarettes. He started smoking about 50 years ago. He has a 25 pack-year smoking history. He has never used smokeless tobacco. He reports that he does not currently use alcohol. He reports that he does not use drugs.  Allergies: No Known Allergies  Medications: I have reviewed the patient's current medications. Prior to Admission:  Medications Prior to Admission  Medication Sig Dispense Refill Last Dose/Taking   cholecalciferol (VITAMIN D) 1000 UNITS tablet Take 1,000 Units by mouth every evening.   11/17/2023 Evening   dexamethasone (DECADRON) 1 MG tablet Take 1 tablet (1 mg total) by mouth daily with breakfast. 90 tablet 1 11/17/2023 Morning   docusate sodium (COLACE) 100 MG capsule Take 100 mg  by mouth daily.   11/17/2023 Evening   folic acid (FOLVITE) 1 MG tablet Take 1 tablet (1 mg total) by mouth daily. 90 tablet 0 11/17/2023 Evening   Krill Oil 300 MG CAPS Take 300 mg by mouth every evening.   11/17/2023 Evening   levothyroxine (SYNTHROID) 88 MCG tablet Take 1 tablet (88 mcg total) by mouth daily before breakfast. 90 tablet 1 11/17/2023 Morning   loratadine (CLARITIN) 10 MG tablet Take 10 mg by mouth every evening.    11/17/2023 Evening   losartan (COZAAR) 50 MG tablet Take 1.5 tablets (75 mg total) by mouth daily. 135 tablet 1  11/17/2023 Evening   mirtazapine (REMERON) 15 MG tablet Take 1 tablet (15 mg total) by mouth at bedtime. 90 tablet 0 11/17/2023 Bedtime   Multiple Vitamins-Minerals (MULTIVITAMIN GUMMIES ADULT PO) Take 2 each by mouth daily.   11/17/2023 Evening   pantoprazole (PROTONIX) 40 MG tablet Take 1 tablet (40 mg total) by mouth every evening. 90 tablet 1 11/17/2023 Evening   rosuvastatin (CRESTOR) 20 MG tablet Take 1 tablet (20 mg total) by mouth daily. 90 tablet 1 11/17/2023 Evening   Semaglutide,0.25 or 0.5MG /DOS, (OZEMPIC, 0.25 OR 0.5 MG/DOSE,) 2 MG/3ML SOPN Inject 0.5 mg into the skin once a week. 9 mL 0 11/03/2023   testosterone cypionate (DEPOTESTOTERONE CYPIONATE) 100 MG/ML injection Take 50mg  ( 0.61ml) and 100mg  ( 1ml) into muscle alternatively weekly. 10 mL 1 11/14/2023   venlafaxine XR (EFFEXOR-XR) 75 MG 24 hr capsule Take 1 capsule (75 mg total) by mouth daily with breakfast. (Patient taking differently: Take 2 capsules by mouth daily with breakfast.) 90 capsule 1 11/17/2023 Evening   Continuous Glucose Sensor (FREESTYLE LIBRE 3 SENSOR) MISC Place 1 sensor on the skin every 14 days. Use to check glucose continuously 6 each 3    PRESCRIPTION MEDICATION Testosterone injection      SYRINGE-NEEDLE, DISP, 3 ML (B-D 3CC LUER-LOK SYR 21GX1-1/2) 21G X 1-1/2" 3 ML MISC Use to inject testosterone every week 100 each 2     Results for orders placed or performed during the hospital encounter of 11/17/23 (from the past 48 hours)  Urinalysis, Routine w reflex microscopic -Urine, Clean Catch     Status: None   Collection Time: 11/18/23 12:02 AM  Result Value Ref Range   Color, Urine YELLOW YELLOW   APPearance CLEAR CLEAR   Specific Gravity, Urine 1.005 1.005 - 1.030   pH 5.0 5.0 - 8.0   Glucose, UA NEGATIVE NEGATIVE mg/dL   Hgb urine dipstick NEGATIVE NEGATIVE   Bilirubin Urine NEGATIVE NEGATIVE   Ketones, ur NEGATIVE NEGATIVE mg/dL   Protein, ur NEGATIVE NEGATIVE mg/dL   Nitrite NEGATIVE NEGATIVE   Leukocytes,Ua  NEGATIVE NEGATIVE    Comment: Performed at [facility], [address] [place], [place] [address]  Lipase, blood     Status: None   Collection Time: 11/18/23 12:14 AM  Result Value Ref Range   Lipase 31 11 - 51 U/L    Comment: Performed at [facility], [address], [place], [place] [address]  Comprehensive metabolic panel     Status: Abnormal   Collection Time: 11/18/23 12:14 AM  Result Value Ref Range   Sodium 139 135 - 145 mmol/L   Potassium 3.8 3.5 - 5.1 mmol/L   Chloride 103 98 - 111 mmol/L   CO2 25 22 - 32 mmol/L   Glucose, Bld 113 (H) 70 - 99 mg/dL    Comment: Glucose reference range applies only to samples taken  after fasting for at least 8 hours.   BUN 20 8 - 23 mg/dL   Creatinine, Ser 0.98 (H) 0.61 - 1.24 mg/dL   Calcium 11.9 8.9 - 14.7 mg/dL   Total Protein 7.3 6.5 - 8.1 g/dL   Albumin 3.7 3.5 - 5.0 g/dL   AST 20 15 - 41 U/L   ALT 18 0 - 44 U/L   Alkaline Phosphatase 56 38 - 126 U/L   Total Bilirubin 0.6 0.0 - 1.2 mg/dL   GFR, Estimated >82 >95 mL/min    Comment: (NOTE) Calculated using the CKD-EPI Creatinine Equation (2021)    Anion gap 11 5 - 15    Comment: Performed at [facility], [address], [place], [place] [address]  CBC     Status: Abnormal   Collection Time: 11/18/23 12:14 AM  Result Value Ref Range   WBC 18.5 (H) 4.0 - 10.5 K/uL   RBC 5.55 4.22 - 5.81 MIL/uL   Hemoglobin 14.1 13.0 - 17.0 g/dL   HCT 86.5 78.4 - 69.6 %   MCV 86.7 80.0 - 100.0 fL   MCH 25.4 (L) 26.0 - 34.0 pg   MCHC 29.3 (L) 30.0 - 36.0 g/dL   RDW 29.5 (H) 28.4 - 13.2 %   Platelets 404 (H) 150 - 400 K/uL   nRBC 0.0 0.0 - 0.2 %    Comment: Performed at [facility], [address] [place], [place] [address]  HIV Antibody (routine testing w rflx)     Status: None   Collection Time: 11/18/23  9:35 AM  Result Value Ref Range   HIV Screen 4th Generation wRfx Non Reactive Non Reactive    Comment: Performed at [facility], [address], [place], [place] [address]   Hemoglobin A1c     Status: Abnormal   Collection Time: 11/18/23  9:35 AM  Result Value Ref Range   Hgb A1c MFr Bld 5.8 (H) 4.8 - 5.6 %    Comment: (NOTE) Pre diabetes:          5.7%-6.4%  Diabetes:              >6.4%  Glycemic control for   <7.0% adults with diabetes    Mean Plasma Glucose 119.76 mg/dL    Comment: Performed at [facility], [address] [place], [place] [address]  Comprehensive metabolic panel     Status: Abnormal   Collection Time: 11/19/23  3:50 AM  Result Value Ref Range   Sodium 138 135 - 145 mmol/L   Potassium 3.4 (L) 3.5 - 5.1 mmol/L   Chloride 108 98 - 111 mmol/L   CO2 22 22 - 32 mmol/L   Glucose, Bld 70 70 - 99 mg/dL    Comment: Glucose reference range applies only to samples taken after fasting for at least 8 hours.   BUN 17 8 - 23 mg/dL   Creatinine, Ser 3.47 0.61 - 1.24 mg/dL   Calcium 8.0 (L) 8.9 - 10.3 mg/dL    Comment: DELTA CHECK NOTED   Total Protein 5.5 (L) 6.5 - 8.1 g/dL   Albumin 2.8 (L) 3.5 - 5.0 g/dL   AST 14 (L) 15 - 41 U/L   ALT 13 0 - 44 U/L   Alkaline Phosphatase 48 38 - 126 U/L   Total Bilirubin 0.5 0.0 - 1.2 mg/dL   GFR, Estimated >42 >59 mL/min    Comment: (NOTE) Calculated using the CKD-EPI Creatinine Equation (2021)    Anion gap 8  5 - 15    Comment: Performed at [facility], [address], [place], [place] [address]  CBC     Status: Abnormal   Collection Time: 11/19/23  3:50 AM  Result Value Ref Range   WBC 10.2 4.0 - 10.5 K/uL   RBC 4.50 4.22 - 5.81 MIL/uL   Hemoglobin 11.3 (L) 13.0 - 17.0 g/dL   HCT 09.8 (L) 11.9 - 14.7 %   MCV 85.8 80.0 - 100.0 fL   MCH 25.1 (L) 26.0 - 34.0 pg   MCHC 29.3 (L) 30.0 - 36.0 g/dL   RDW 82.9 (H) 56.2 - 13.0 %   Platelets 300 150 - 400 K/uL   nRBC 0.0 0.0 - 0.2 %    Comment: Performed at [facility], [address] [place], [place] [address]  Magnesium     Status: Abnormal   Collection Time: 11/19/23  3:50 AM  Result Value Ref Range   Magnesium 1.6 (L) 1.7 - 2.4 mg/dL     Comment: Performed at [facility], [address] [place], [place] [address]  Phosphorus     Status: Abnormal   Collection Time: 11/19/23  3:50 AM  Result Value Ref Range   Phosphorus 2.3 (L) 2.5 - 4.6 mg/dL    Comment: Performed at [facility], [address] [place], [place] [address]    DG Abd Portable 1 View Result Date: 11/18/2023 CLINICAL DATA:  68 year old male status post nasogastric tube placement. EXAM: PORTABLE ABDOMEN - 1 VIEW COMPARISON:  CT of the abdomen and pelvis 11/18/2023. FINDINGS: Tip of nasogastric tube is in the distal esophagus shortly before the gastroesophageal junction. Numerous gas-filled loops of small bowel are noted throughout the central abdomen measuring up to 5.1 cm in diameter. Paucity of colonic gas. IMPRESSION: Tip of nasogastric tube is in the distal esophagus and should be advanced approximately 15 cm for more optimal placement. Electronically Signed   By: Trudie Reed M.D.   On: 11/18/2023 05:25   CT ABDOMEN PELVIS W CONTRAST Result Date: 11/18/2023 CLINICAL DATA:  Abdominal pain and diarrhea. EXAM: CT ABDOMEN AND PELVIS WITH CONTRAST TECHNIQUE: Multidetector CT imaging of the abdomen and pelvis was performed using the standard protocol following bolus administration of intravenous contrast. RADIATION DOSE REDUCTION: This exam was performed according to the departmental dose-optimization program which includes automated exposure control, adjustment of the mA and/or kV according to patient size and/or use of iterative reconstruction technique. CONTRAST:  OMNIPAQUE IOHEXOL 300 MG/ML  SOLN COMPARISON:  November 11, 2023 FINDINGS: Lower chest: There is a small, stable pericardial effusion. Hepatobiliary: A small, stable hepatic cyst is seen within the right lobe of the liver. No gallstones, gallbladder wall thickening, or biliary dilatation. Pancreas: Unremarkable. No pancreatic ductal dilatation or surrounding inflammatory changes. Spleen: Normal in size  without focal abnormality. Adrenals/Urinary Tract: Adrenal glands are unremarkable. Kidneys are normal in size, without obstructing renal calculi, focal lesion, or hydronephrosis. A 9 mm cluster of nonobstructing renal calculi are seen within the left kidney. Bladder is unremarkable. Stomach/Bowel: Stomach is within normal limits. Appendix appears normal. Multiple dilated small bowel loops are seen throughout the abdomen and pelvis (maximum small bowel diameter of approximately 3.7 cm). An abrupt transition joint is seen within the left lower quadrant (best seen on coronal reformatted images 55 through 59, CT series 5). Vascular/Lymphatic: Aortic atherosclerosis. No enlarged abdominal or pelvic lymph nodes. Reproductive: There is mild to moderate severity prostate gland enlargement and prostate calcification. Other: No abdominal wall hernia or  abnormality. No abdominopelvic ascites. Musculoskeletal: No acute or significant osseous findings. IMPRESSION: 1. Small bowel obstruction with an abrupt transition point within the left lower quadrant. 2. Small, stable pericardial effusion. 3. Nonobstructing left renal calculi. 4. Mild to moderate severity prostate gland enlargement. 5. Aortic atherosclerosis. Aortic Atherosclerosis (ICD10-I70.0). Electronically Signed   By: Aram Candela M.D.   On: 11/18/2023 01:57    ROS:  Pertinent items are noted in HPI.  Blood pressure 128/87, pulse (!) 104, temperature 98.5 F (36.9 C), temperature source Oral, resp. rate 18, height 5\' 11"  (1.803 m), weight 83 kg, SpO2 95%. Physical Exam: Well-developed well-nourished white male no acute distress Head is normocephalic, atraumatic Lungs clear to auscultation with equal breath sounds bilaterally Heart examination reveals regular rate and rhythm without S3, S4, murmurs Abdomen is soft, nontender.  Mild distention is noted.  No rigidity is noted.  NG tube was advanced due to malposition.  Repositioning was confirmed with  auscultation and air insufflation.  CT scan images personally reviewed  Assessment/Plan: Impression: Small bowel obstruction of unknown etiology.  No need for acute surgical intervention at the present time.  Interestingly, he had some diarrhea at this may be due to an enteritis. Plan: Continue NG tube decompression for now.  May get small bowel obstruction protocol study tomorrow pending hospital course.  Franky Macho 11/19/2023, 10:40 AM

## 2023-11-19 NOTE — Progress Notes (Signed)
Nurse at bedside,patient's CBG was 69,Dr Dareen Piano notified. Patient received  Dextrose 1 ampule given IV,patient tolerated the medication with no problems. Recheck CBG was 71. New orders received and given. Plan of care on going.

## 2023-11-19 NOTE — Progress Notes (Signed)
PROGRESS NOTE  Bradley Goodman    DOB: 11-Feb-1956, 68 y.o.  ZOX:[id number]    Code Status: Full Code   DOA: 11/17/2023   LOS: 1   Brief hospital course  Bradley Goodman is a 68 y.o. male with medical history significant of hypertension, hyperlipidemia, hypothyroidism, type 2 diabetes mellitus, adenocarcinoma of right lung who presents to the emergency department due to diarrhea, mid abdominal pain which was of moderate intensity, abdominal distention and bloating which started within the last 24 hours.  Patient has a remote history of small bowel obstruction.   ED Course: 101 bpm, other vital signs were within normal range.  leukocytosis and thrombocytosis.  BMP was normal except for blood glucose of 113 and creatinine of 1.27, lipase was 31, urinalysis was normal. CT abdomen and pelvis with contrast showed small bowel obstruction with an abrupt transition point within the left lower quadrant. NG tube was placed.  Hospitalist was asked to admit patient for further evaluation and management. General surgery is following.  11/19/23 -pain is improving. Continues to have bloating. Endorses small amount of gas passing  Assessment & Plan  Principal Problem:   Small bowel obstruction (HCC) Active Problems:   Essential hypertension   Mixed hyperlipidemia   Adenocarcinoma of right lung, stage 4 (HCC)   Acquired hypothyroidism   Type 2 diabetes mellitus with hyperglycemia, without long-term current use of insulin (HCC)   Leukocytosis   Thrombocytosis  Small bowel obstruction- as seen on CT in [facility]. Bloated but pain improved. Passing gas. No BM since admission. Consider possible post-viral ileus since had recent diarrheal event - Continue NG tube  - Continue NPO and started dextrose containing IVF for asymptomatic hypoglycemia this morning.  - f/u with general surgery consult   Essential hypertension- remaining well controlled while NPO and not able to tolerate his BP medication   Mixed  hyperlipidemia - restart statin when taking PO   Acquired hypothyroidism - restart levothyroxine when taking PO   Type 2 diabetes mellitus without long-term use of insulin Hemoglobin A1c on 04/16/2023 was 6.0 Monitor glucose while NPO to avoid hypoglycemia   Adenocarcinoma of lung, stage IV Patient has stage IV (T1b, N3, M1 C) non-small cell lung cancer  He follows with Dr. Shirline Frees and is currently on maintenance therapy with Alimta and Keytruda every three weeks. Completed 43 cycles He follows with Dr. Shirline Frees and last office visit was 10/29/2023 Currently stable ORA. Follow up outpatient  Body mass index is 25.52 kg/m.  VTE ppx: enoxaparin (LOVENOX) injection 40 mg Start: 11/18/23 2200 SCDs Start: 11/18/23 0731   Diet:     Diet   Diet NPO time specified   Consultants: General surgery   Subjective 11/19/23    Pt reports feeling improved. Denies nausea or abdominal pain at rest but had pain when his wife accidentally brushed up against his abdomen. Bloating is still the same. Denies BM but passing small amounts of gas   Objective   Vitals:   11/18/23 0924 11/18/23 1318 11/18/23 2016 11/19/23 0551  BP: 116/84 129/85 118/82 127/86  Pulse: (!) 101 (!) 107 (!) 110 (!) 103  Resp: 17 19 16 16   Temp: 98.5 F (36.9 C) 99 F (37.2 C) 98.7 F (37.1 C) 98.5 F (36.9 C)  TempSrc: Oral Oral Oral Oral  SpO2: 94% 96% 95% 95%  Weight:      Height:        Intake/Output Summary (Last 24 hours) at 11/19/2023 8119 Last data filed at  11/19/2023 0531 Gross per 24 hour  Intake 534.68 ml  Output 850 ml  Net -315.32 ml   Filed Weights   11/17/23 2359  Weight: 83 kg     Physical Exam:  General: awake, alert, NAD. sweaty HEENT: atraumatic, clear conjunctiva, anicteric sclera, MMM, hearing grossly normal Respiratory: normal respiratory effort. Cardiovascular: quick capillary refill, normal S1/S2, RRR, no JVD, murmurs Gastrointestinal: soft but bloated. No tenderness with gentle  palpation of all quadrants.  Nervous: A&O x3. no gross focal neurologic deficits, normal speech Extremities: moves all equally, no edema, normal tone Skin: dry, intact, normal temperature, normal color. No rashes, lesions or ulcers on exposed skin Psychiatry: normal mood, congruent affect  Labs   I have personally reviewed the following labs and imaging studies CBC    Component Value Date/Time   WBC 10.2 11/19/2023 0350   RBC 4.50 11/19/2023 0350   HGB 11.3 (L) 11/19/2023 0350   HGB 12.3 (L) 10/29/2023 0939   HGB 12.1 (L) 06/07/2023 0825   HCT 38.6 (L) 11/19/2023 0350   HCT 41.6 06/07/2023 0825   PLT 300 11/19/2023 0350   PLT 341 10/29/2023 0939   PLT 205 06/07/2023 0825   MCV 85.8 11/19/2023 0350   MCV 86 06/07/2023 0825   MCH 25.1 (L) 11/19/2023 0350   MCHC 29.3 (L) 11/19/2023 0350   RDW 19.7 (H) 11/19/2023 0350   RDW 18.2 (H) 06/07/2023 0825   LYMPHSABS 2.4 10/29/2023 0939   LYMPHSABS 2.6 06/07/2023 0825   MONOABS 0.9 10/29/2023 0939   EOSABS 0.2 10/29/2023 0939   EOSABS 0.2 06/07/2023 0825   BASOSABS 0.1 10/29/2023 0939   BASOSABS 0.1 06/07/2023 0825      Latest Ref Rng & Units 11/19/2023    3:50 AM 11/18/2023   12:14 AM 10/29/2023    9:39 AM  BMP  Glucose 70 - 99 mg/dL 70  102  86   BUN 8 - 23 mg/dL 17  20  14    Creatinine 0.61 - 1.24 mg/dL 7.25  3.66  4.40   Sodium 135 - 145 mmol/L 138  139  143   Potassium 3.5 - 5.1 mmol/L 3.4  3.8  4.1   Chloride 98 - 111 mmol/L 108  103  106   CO2 22 - 32 mmol/L 22  25  29    Calcium 8.9 - 10.3 mg/dL 8.0  34.7  9.2     DG Abd Portable 1 View Result Date: 11/18/2023 CLINICAL DATA:  68 year old male status post nasogastric tube placement. EXAM: PORTABLE ABDOMEN - 1 VIEW COMPARISON:  CT of the abdomen and pelvis 11/18/2023. FINDINGS: Tip of nasogastric tube is in the distal esophagus shortly before the gastroesophageal junction. Numerous gas-filled loops of small bowel are noted throughout the central abdomen measuring up to 5.1  cm in diameter. Paucity of colonic gas. IMPRESSION: Tip of nasogastric tube is in the distal esophagus and should be advanced approximately 15 cm for more optimal placement. Electronically Signed   By: Trudie Reed M.D.   On: 11/18/2023 05:25   CT ABDOMEN PELVIS W CONTRAST Result Date: 11/18/2023 CLINICAL DATA:  Abdominal pain and diarrhea. EXAM: CT ABDOMEN AND PELVIS WITH CONTRAST TECHNIQUE: Multidetector CT imaging of the abdomen and pelvis was performed using the standard protocol following bolus administration of intravenous contrast. RADIATION DOSE REDUCTION: This exam was performed according to the departmental dose-optimization program which includes automated exposure control, adjustment of the mA and/or kV according to patient size and/or use of iterative reconstruction technique. CONTRAST:  OMNIPAQUE IOHEXOL 300 MG/ML  SOLN COMPARISON:  November 11, 2023 FINDINGS: Lower chest: There is a small, stable pericardial effusion. Hepatobiliary: A small, stable hepatic cyst is seen within the right lobe of the liver. No gallstones, gallbladder wall thickening, or biliary dilatation. Pancreas: Unremarkable. No pancreatic ductal dilatation or surrounding inflammatory changes. Spleen: Normal in size without focal abnormality. Adrenals/Urinary Tract: Adrenal glands are unremarkable. Kidneys are normal in size, without obstructing renal calculi, focal lesion, or hydronephrosis. A 9 mm cluster of nonobstructing renal calculi are seen within the left kidney. Bladder is unremarkable. Stomach/Bowel: Stomach is within normal limits. Appendix appears normal. Multiple dilated small bowel loops are seen throughout the abdomen and pelvis (maximum small bowel diameter of approximately 3.7 cm). An abrupt transition joint is seen within the left lower quadrant (best seen on coronal reformatted images 55 through 59, CT series 5). Vascular/Lymphatic: Aortic atherosclerosis. No enlarged abdominal or pelvic lymph nodes.  Reproductive: There is mild to moderate severity prostate gland enlargement and prostate calcification. Other: No abdominal wall hernia or abnormality. No abdominopelvic ascites. Musculoskeletal: No acute or significant osseous findings. IMPRESSION: 1. Small bowel obstruction with an abrupt transition point within the left lower quadrant. 2. Small, stable pericardial effusion. 3. Nonobstructing left renal calculi. 4. Mild to moderate severity prostate gland enlargement. 5. Aortic atherosclerosis. Aortic Atherosclerosis (ICD10-I70.0). Electronically Signed   By: Aram Candela M.D.   On: 11/18/2023 01:57    Disposition Plan & Communication  Patient status: Inpatient  Admitted From: Home Planned disposition location: Home Anticipated discharge date: 2/12 pending recovering bowel function   Family Communication: wife at bedside    Author: Leeroy Bock, DO [facility] 11/19/2023, 7:19 AM   Available by Epic secure chat 7AM-7PM. If 7PM-7AM, please contact night-coverage.  [facility] contact information found on [email or web address].

## 2023-11-19 NOTE — Progress Notes (Signed)
Mobility Specialist Progress Note:    11/19/23 0940  Mobility  Activity Ambulated with assistance in hallway  Level of Assistance Standby assist, set-up cues, supervision of patient - no hands on  Assistive Device None  Distance Ambulated (ft) 140 ft  Range of Motion/Exercises Active;All extremities  Activity Response Tolerated well  Mobility Referral Yes  Mobility visit 1 Mobility  Mobility Specialist Start Time (ACUTE ONLY) 0940  Mobility Specialist Stop Time (ACUTE ONLY) 1000  Mobility Specialist Time Calculation (min) (ACUTE ONLY) 20 min   Pt received in bed, visitor in room. Agreeable to mobility, required SBA to stand and ambulate with no AD. Tolerated well, asx throughout. Returned pt sitting EOB, all needs met.  Bradley Goodman Mobility Specialist Please contact via Special educational needs teacher or  Rehab office at [phone]

## 2023-11-19 NOTE — Plan of Care (Signed)
Problem: Education: Goal: Knowledge of General Education information will improve Description Including pain rating scale, medication(s)/side effects and non-pharmacologic comfort measures Outcome: Progressing   Problem: Education: Goal: Knowledge of General Education information will improve Description Including pain rating scale, medication(s)/side effects and non-pharmacologic comfort measures Outcome: Progressing

## 2023-11-20 ENCOUNTER — Inpatient Hospital Stay (HOSPITAL_COMMUNITY): Payer: Commercial Managed Care - PPO

## 2023-11-20 DIAGNOSIS — K56609 Unspecified intestinal obstruction, unspecified as to partial versus complete obstruction: Secondary | ICD-10-CM | POA: Diagnosis not present

## 2023-11-20 LAB — CBC
HCT: 36.8 % — ABNORMAL LOW (ref 39.0–52.0)
Hemoglobin: 11.3 g/dL — ABNORMAL LOW (ref 13.0–17.0)
MCH: 26.2 pg (ref 26.0–34.0)
MCHC: 30.7 g/dL (ref 30.0–36.0)
MCV: 85.2 fL (ref 80.0–100.0)
Platelets: 291 10*3/uL (ref 150–400)
RBC: 4.32 MIL/uL (ref 4.22–5.81)
RDW: 19.8 % — ABNORMAL HIGH (ref 11.5–15.5)
WBC: 8.3 10*3/uL (ref 4.0–10.5)
nRBC: 0 % (ref 0.0–0.2)

## 2023-11-20 LAB — BASIC METABOLIC PANEL
Anion gap: 8 (ref 5–15)
BUN: 13 mg/dL (ref 8–23)
CO2: 24 mmol/L (ref 22–32)
Calcium: 7.9 mg/dL — ABNORMAL LOW (ref 8.9–10.3)
Chloride: 107 mmol/L (ref 98–111)
Creatinine, Ser: 0.92 mg/dL (ref 0.61–1.24)
GFR, Estimated: >60 mL/min (ref >60)
Glucose, Bld: 84 mg/dL (ref 70–99)
Potassium: 3.2 mmol/L — ABNORMAL LOW (ref 3.5–5.1)
Sodium: 139 mmol/L (ref 135–145)

## 2023-11-20 LAB — HEMOGLOBIN AND HEMATOCRIT, BLOOD
HCT: 40.4 % (ref 39.0–52.0)
Hemoglobin: 12.1 g/dL — ABNORMAL LOW (ref 13.0–17.0)

## 2023-11-20 LAB — TROPONIN I (HIGH SENSITIVITY): Troponin I (High Sensitivity): 6 ng/L (ref <18)

## 2023-11-20 MED ORDER — ACETAMINOPHEN 10 MG/ML IV SOLN
1000.0000 mg | Freq: Once | INTRAVENOUS | Status: AC
  Administered 2023-11-20: 1000 mg via INTRAVENOUS
  Filled 2023-11-20: qty 100

## 2023-11-20 MED ORDER — DEXTROSE-SODIUM CHLORIDE 5-0.9 % IV SOLN: INTRAVENOUS | Status: DC

## 2023-11-20 MED ORDER — POTASSIUM CHLORIDE 10 MEQ/100ML IV SOLN
10.0000 meq | INTRAVENOUS | Status: AC
  Administered 2023-11-20 (×5): 10 meq via INTRAVENOUS

## 2023-11-20 MED ORDER — METOPROLOL TARTRATE 5 MG/5ML IV SOLN
2.5000 mg | Freq: Three times a day (TID) | INTRAVENOUS | Status: DC
  Administered 2023-11-20 – 2023-11-23 (×9): 2.5 mg via INTRAVENOUS
  Filled 2023-11-20 (×9): qty 5

## 2023-11-20 MED ORDER — MORPHINE SULFATE (PF) 2 MG/ML IV SOLN
1.0000 mg | Freq: Once | INTRAVENOUS | Status: AC
  Administered 2023-11-20: 1 mg via INTRAVENOUS

## 2023-11-20 MED ORDER — ACETAMINOPHEN 650 MG RE SUPP: 650.0000 mg | Freq: Once | RECTAL | Status: DC

## 2023-11-20 NOTE — Progress Notes (Addendum)
MEWS Progress Note  Patient Details Name: Bradley Goodman MRN: [id number] DOB: 1956-08-02 Today's Date: 11/20/2023   MEWS Flowsheet Documentation:  Assess: MEWS Score Temp: 99.9 F (37.7 C) BP: 124/88 MAP (mmHg): 100 Pulse Rate: (!) 116 (Isaias Dowson, Rn notified by Marchelle Folks, CNA) ECG Heart Rate: 94 Resp: 18 Level of Consciousness: Alert SpO2: 94 % O2 Device: Room Air O2 Flow Rate (L/min): 0 L/min FiO2 (%): (!) 0 % Assess: MEWS Score MEWS Temp: 0 MEWS Systolic: 0 MEWS Pulse: 2 MEWS RR: 0 MEWS LOC: 0 MEWS Score: 2 MEWS Score Color: Yellow Assess: SIRS CRITERIA SIRS Temperature : 0 SIRS Respirations : 0 SIRS Pulse: 1 SIRS WBC: 0 SIRS Score Sum : 1 SIRS Temperature : 0 SIRS Pulse: 1 SIRS Respirations : 0 SIRS WBC: 0 SIRS Score Sum : 1 Provider Notification Provider Name/TitleArville Care Date Provider Notified: 11/20/23 Time Provider Notified: 2038 Method of Notification: Page Notification Reason: Other (Comment) (Hr is 116)      Bradley Goodman 11/20/2023, 8:48 PM

## 2023-11-20 NOTE — Progress Notes (Signed)
Nurse at bedside, Morphine 1 mg IV, per MAR prn for back pain.Family at bedside. Plan of care on going.

## 2023-11-20 NOTE — Progress Notes (Signed)
  Subjective: No abdominal pain.  Possibly small amount of flatus.  No bowel movement.  Objective: Vital signs in last 24 hours: Temp:  [98.9 F (37.2 C)-100.6 F (38.1 C)] 99.7 F (37.6 C) (02/12 0813) Pulse Rate:  [107-112] 108 (02/12 0813) Resp:  [14-20] 14 (02/12 0813) BP: (121-129)/(86-89) 125/87 (02/12 0813) SpO2:  [90 %-95 %] 90 % (02/12 0813) Last BM Date : 11/17/23  Intake/Output from previous day: 02/11 0701 - 02/12 0700 In: 1428.9 [I.V.:1278.9; NG/GT:150] Out: 2575 [Urine:300; Emesis/NG output:2275] Intake/Output this shift: No intake/output data recorded.  General appearance: alert, cooperative, and no distress GI: Soft, mildly distended.  Minimal bowel sounds appreciated.  No tenderness noted.  No rigidity noted.  Lab Results:  Recent Labs    11/19/23 0350 11/20/23 0451  WBC 10.2 8.3  HGB 11.3* 11.3*  HCT 38.6* 36.8*  PLT 300 291   BMET Recent Labs    11/19/23 0350 11/20/23 0451  NA 138 139  K 3.4* 3.2*  CL 108 107  CO2 22 24  GLUCOSE 70 84  BUN 17 13  CREATININE 0.96 0.92  CALCIUM 8.0* 7.9*   PT/INR No results for input(s): "LABPROT", "INR" in the last 72 hours.  Studies/Results: No results found.  Anti-infectives: Anti-infectives (From admission, onward)    None       Assessment/Plan: Impression: Small bowel obstruction.  Patient has had over 2 L of NG tube output.  No need for acute surgical intervention.  Will consider small bowel obstruction protocol study tomorrow if the NG tube output decreases.  Will supplement potassium.  Patient may need exploratory laparotomy should his obstruction not resolved.  LOS: 2 days    Franky Macho 11/20/2023

## 2023-11-20 NOTE — Progress Notes (Signed)
PROGRESS NOTE  Bradley Goodman    DOB: 1956-07-03, 68 y.o.  NFA:[id number]    Code Status: Full Code   DOA: 11/17/2023   LOS: 2   Brief hospital course  Bradley Goodman is a 68 y.o. male with medical history significant of hypertension, hyperlipidemia, hypothyroidism, type 2 diabetes mellitus, adenocarcinoma of right lung who presents to the emergency department due to diarrhea, mid abdominal pain which was of moderate intensity, abdominal distention and bloating which started within the last 24 hours.  Patient has a remote history of small bowel obstruction.   ED Course: 101 bpm, other vital signs were within normal range.  leukocytosis and thrombocytosis.  BMP was normal except for blood glucose of 113 and creatinine of 1.27, lipase was 31, urinalysis was normal. CT abdomen and pelvis with contrast showed small bowel obstruction with an abrupt transition point within the left lower quadrant. NG tube was placed.  Hospitalist was asked to admit patient for further evaluation and management. General surgery is following.  11/20/23 -pain is improving. Continues to have bloating. Endorses small amount of gas passing  Assessment & Plan  Principal Problem:   Small bowel obstruction (HCC) Active Problems:   Essential hypertension   Mixed hyperlipidemia   Adenocarcinoma of right lung, stage 4 (HCC)   Acquired hypothyroidism   Type 2 diabetes mellitus with hyperglycemia, without long-term current use of insulin (HCC)   Leukocytosis   Thrombocytosis  Small bowel obstruction- as seen on CT in LLQ. Bloated and pain improved. Passing gas. No BM since admission. Consider possible post-viral ileus since had recent diarrheal event. Having a significant amount of output from ng tube still. Repeat  KUB showed persistent small bowel dilation and states that catheter needs to be advanced further.  - Continue NG tube  - Continue NPO - started dextrose containing IVF for asymptomatic hypoglycemia episode and  has been stable - f/u with general surgery consult  Febrile- reactionary vs infectious.  - tylenol IV given and blood cultures collected. Will monitor fever curve and start antibiotics if continues. Has normal WBC count.  Chest pressure and SOB episode this am. Suspect from the NG tube/obstruction but completed evaluation for ACS. Negative troponin and ECG and resolved spontaneously. Extremely low suspicion for ACS - monitor for repeat chest pain   Essential hypertension- remaining well controlled while NPO and not able to tolerate his BP medication   Mixed hyperlipidemia - restart statin when taking PO   Acquired hypothyroidism - restart levothyroxine when taking PO   Type 2 diabetes mellitus without long-term use of insulin Hemoglobin A1c on 04/16/2023 was 6.0 Monitor glucose while NPO to avoid hypoglycemia   Adenocarcinoma of lung, stage IV Patient has stage IV (T1b, N3, M1 C) non-small cell lung cancer  He follows with Dr. Shirline Frees and is currently on maintenance therapy with Alimta and Keytruda every three weeks. Completed 43 cycles He follows with Dr. Shirline Frees and last office visit was 10/29/2023 Currently stable ORA. Follow up outpatient  Body mass index is 25.52 kg/m.  VTE ppx: enoxaparin (LOVENOX) injection 40 mg Start: 11/18/23 2200 SCDs Start: 11/18/23 0731   Diet:     Diet   Diet NPO time specified   Consultants: General surgery   Subjective 11/20/23    Pt reports feeling as though his bloating has gone down. Denies BM. No abdominal pain at rest.  Had chest pressure this morning which has resolved.    Objective   Vitals:   11/19/23 0915 11/19/23  2122 11/20/23 0405 11/20/23 0700  BP: 128/87 128/89 121/86 129/87  Pulse: (!) 104 (!) 112 (!) 111 (!) 107  Resp: 18 18 20 18   Temp:  (!) 100.6 F (38.1 C) (!) 100.4 F (38 C) 98.9 F (37.2 C)  TempSrc:  Oral Oral Oral  SpO2: 95% 95% 95% 93%  Weight:      Height:        Intake/Output Summary (Last 24  hours) at 11/20/2023 0720 Last data filed at 11/20/2023 0543 Gross per 24 hour  Intake 1428.87 ml  Output 2575 ml  Net -1146.13 ml   Filed Weights   11/17/23 2359  Weight: 83 kg     Physical Exam:  General: awake, alert, NAD. sweaty HEENT: atraumatic, clear conjunctiva, anicteric sclera, MMM, hearing grossly normal Respiratory: normal respiratory effort. Cardiovascular: quick capillary refill, normal S1/S2, RRR, no JVD, murmurs Gastrointestinal: soft but bloated. No tenderness with gentle palpation of all quadrants.  Nervous: A&O x3. no gross focal neurologic deficits, normal speech Extremities: moves all equally, no edema, normal tone Skin: dry, intact, normal temperature, normal color. No rashes, lesions or ulcers on exposed skin Psychiatry: normal mood, congruent affect  Labs   I have personally reviewed the following labs and imaging studies CBC    Component Value Date/Time   WBC 8.3 11/20/2023 0451   RBC 4.32 11/20/2023 0451   HGB 11.3 (L) 11/20/2023 0451   HGB 12.3 (L) 10/29/2023 0939   HGB 12.1 (L) 06/07/2023 0825   HCT 36.8 (L) 11/20/2023 0451   HCT 41.6 06/07/2023 0825   PLT 291 11/20/2023 0451   PLT 341 10/29/2023 0939   PLT 205 06/07/2023 0825   MCV 85.2 11/20/2023 0451   MCV 86 06/07/2023 0825   MCH 26.2 11/20/2023 0451   MCHC 30.7 11/20/2023 0451   RDW 19.8 (H) 11/20/2023 0451   RDW 18.2 (H) 06/07/2023 0825   LYMPHSABS 2.4 10/29/2023 0939   LYMPHSABS 2.6 06/07/2023 0825   MONOABS 0.9 10/29/2023 0939   EOSABS 0.2 10/29/2023 0939   EOSABS 0.2 06/07/2023 0825   BASOSABS 0.1 10/29/2023 0939   BASOSABS 0.1 06/07/2023 0825      Latest Ref Rng & Units 11/20/2023    4:51 AM 11/19/2023    3:50 AM 11/18/2023   12:14 AM  BMP  Glucose 70 - 99 mg/dL 84  70  161   BUN 8 - 23 mg/dL 13  17  20    Creatinine 0.61 - 1.24 mg/dL 0.96  0.45  4.09   Sodium 135 - 145 mmol/L 139  138  139   Potassium 3.5 - 5.1 mmol/L 3.2  3.4  3.8   Chloride 98 - 111 mmol/L 107  108  103    CO2 22 - 32 mmol/L 24  22  25    Calcium 8.9 - 10.3 mg/dL 7.9  8.0  81.1     No results found.   Disposition Plan & Communication  Patient status: Inpatient  Admitted From: Home Planned disposition location: Home Anticipated discharge date: 2/12 pending recovering bowel function   Family Communication: wife at bedside    Author: Leeroy Bock, DO [facility] 11/20/2023, 7:20 AM   Available by Epic secure chat 7AM-7PM. If 7PM-7AM, please contact night-coverage.  [facility] contact information found on [email or web address].

## 2023-11-20 NOTE — Progress Notes (Signed)
[place] notifed of Yellow mews   11/20/23 2035  Vitals  Temp 99.9 F (37.7 C)  Temp Source Oral  BP 124/88  MAP (mmHg) 100  BP Location Left Arm  BP Method Automatic  Patient Position (if appropriate) Lying  Pulse Rate (!) 116  Pulse Rate Source Monitor  Resp 18  Level of Consciousness  Level of Consciousness Alert  MEWS COLOR  MEWS Score Color Yellow  Oxygen Therapy  SpO2 94 %  O2 Device Room Air  MEWS Score  MEWS Temp 0  MEWS Systolic 0  MEWS Pulse 2  MEWS RR 0  MEWS LOC 0  MEWS Score 2

## 2023-11-20 NOTE — Plan of Care (Signed)
  Problem: Education: Goal: Knowledge of General Education information will improve Description: Including pain rating scale, medication(s)/side effects and non-pharmacologic comfort measures Outcome: Progressing   Problem: Health Behavior/Discharge Planning: Goal: Ability to manage health-related needs will improve Outcome: Progressing   Problem: Coping: Goal: Level of anxiety will decrease Outcome: Progressing   Problem: Skin Integrity: Goal: Risk for impaired skin integrity will decrease Outcome: Progressing

## 2023-11-20 NOTE — Progress Notes (Signed)
Nurse at bedside,patient alert and oriented time four this am .However,patient c/o low back pain,sharp rated a 3,constant. Order received and given Plan of care on going.

## 2023-11-20 NOTE — Progress Notes (Signed)
Nurse at bedside,patient is wanting to get a break in between the potassium runs,some discomfort to the hand.Family at bedside. Plan of  care on going. MD notified.

## 2023-11-20 NOTE — Progress Notes (Signed)
Nurse at bedside, NG tube advanced per Dr Ewell Poe orders. Patient tolerated advancement with some discomfort noted.MD notified. Family at bedside. Plan of are on going.

## 2023-11-20 NOTE — Progress Notes (Addendum)
Emptied 1000 mls from NG canister earlier. Denies pain and nausea and says abd distention has reduced considerably.  Wife at bedside.    Emptied another 400 just now.  Has been running temp 100.6 and 100.4.  No C/O pain or nausea.

## 2023-11-20 NOTE — Progress Notes (Addendum)
   11/20/23 1640  Assess: MEWS Score  Temp 99.8 F (37.7 C)  BP 128/86  MAP (mmHg) 97  Pulse Rate (!) 118  Resp 16  SpO2 96 %  O2 Device Room Air  Assess: MEWS Score  MEWS Temp 0  MEWS Systolic 0  MEWS Pulse 2  MEWS RR 0  MEWS LOC 0  MEWS Score 2  MEWS Score Color Yellow  Assess: if the MEWS score is Yellow or Red  Were vital signs accurate and taken at a resting state? Yes  Does the patient meet 2 or more of the SIRS criteria? No  MEWS guidelines implemented  Yes, yellow  Treat  MEWS Interventions Considered administering scheduled or prn medications/treatments as ordered  Take Vital Signs  Increase Vital Sign Frequency  Yellow: Q2hr x1, continue Q4hrs until patient remains green for 12hrs  Escalate  MEWS: Escalate Yellow: Discuss with charge nurse and consider notifying provider and/or RRT  Notify: Charge Nurse/RN  Name of Charge Nurse/RN Notified mary ann rn  Provider Notification  Provider Name/Title MD Jamelle Rushing  Date Provider Notified 11/20/23  Time Provider Notified 1643  Method of Notification Page  Notification Reason Other (Comment) (yellow mews)  Assess: SIRS CRITERIA  SIRS Temperature  0  SIRS Respirations  0  SIRS Pulse 1  SIRS WBC 0  SIRS Score Sum  1   MD Chelsey Anderson aware. MD ordered IV metoprolol. Will recheck vitals and continue to monitor.

## 2023-11-21 ENCOUNTER — Encounter (HOSPITAL_COMMUNITY): Payer: Self-pay | Admitting: Anesthesiology

## 2023-11-21 ENCOUNTER — Inpatient Hospital Stay (HOSPITAL_COMMUNITY): Payer: Commercial Managed Care - PPO

## 2023-11-21 DIAGNOSIS — K56609 Unspecified intestinal obstruction, unspecified as to partial versus complete obstruction: Secondary | ICD-10-CM | POA: Diagnosis not present

## 2023-11-21 LAB — BASIC METABOLIC PANEL
Anion gap: 11 (ref 5–15)
BUN: 11 mg/dL (ref 8–23)
CO2: 25 mmol/L (ref 22–32)
Calcium: 8.3 mg/dL — ABNORMAL LOW (ref 8.9–10.3)
Chloride: 106 mmol/L (ref 98–111)
Creatinine, Ser: 1.09 mg/dL (ref 0.61–1.24)
GFR, Estimated: >60 mL/min (ref >60)
Glucose, Bld: 98 mg/dL (ref 70–99)
Potassium: 3.5 mmol/L (ref 3.5–5.1)
Sodium: 142 mmol/L (ref 135–145)

## 2023-11-21 LAB — CBC
HCT: 41.6 % (ref 39.0–52.0)
Hemoglobin: 12.3 g/dL — ABNORMAL LOW (ref 13.0–17.0)
MCH: 25.5 pg — ABNORMAL LOW (ref 26.0–34.0)
MCHC: 29.6 g/dL — ABNORMAL LOW (ref 30.0–36.0)
MCV: 86.1 fL (ref 80.0–100.0)
Platelets: 323 10*3/uL (ref 150–400)
RBC: 4.83 MIL/uL (ref 4.22–5.81)
RDW: 19.7 % — ABNORMAL HIGH (ref 11.5–15.5)
WBC: 10.2 10*3/uL (ref 4.0–10.5)
nRBC: 0 % (ref 0.0–0.2)

## 2023-11-21 LAB — GLUCOSE, CAPILLARY: Glucose-Capillary: 104 mg/dL — ABNORMAL HIGH (ref 70–99)

## 2023-11-21 MED ORDER — KCL IN DEXTROSE-NACL 20-5-0.45 MEQ/L-%-% IV SOLN: INTRAVENOUS | Status: DC

## 2023-11-21 MED ORDER — KETOROLAC TROMETHAMINE 15 MG/ML IJ SOLN
15.0000 mg | Freq: Four times a day (QID) | INTRAMUSCULAR | Status: DC | PRN
  Administered 2023-11-21 – 2023-11-22 (×5): 15 mg via INTRAVENOUS
  Filled 2023-11-21 (×5): qty 1

## 2023-11-21 MED ORDER — POTASSIUM CHLORIDE 10 MEQ/100ML IV SOLN
10.0000 meq | INTRAVENOUS | Status: AC
  Administered 2023-11-21 (×2): 10 meq via INTRAVENOUS
  Filled 2023-11-21 (×5): qty 100

## 2023-11-21 MED ORDER — DIATRIZOATE MEGLUMINE & SODIUM 66-10 % PO SOLN
90.0000 mL | Freq: Once | ORAL | Status: AC
  Administered 2023-11-21: 90 mL via NASOGASTRIC
  Filled 2023-11-21: qty 90

## 2023-11-21 NOTE — Progress Notes (Signed)
  Subjective: No abdominal pain  Objective: Vital signs in last 24 hours: Temp:  [97.6 F (36.4 C)-99.9 F (37.7 C)] 98 F (36.7 C) (02/13 0836) Pulse Rate:  [103-118] 103 (02/13 0836) Resp:  [16-20] 18 (02/13 0836) BP: (107-128)/(79-88) 107/84 (02/13 0836) SpO2:  [94 %-98 %] 94 % (02/13 0836) Last BM Date :  (Pt states he had a BM sometime this week, can't remember what day.)  Intake/Output from previous day: 02/12 0701 - 02/13 0700 In: 0  Out: 4800 [Urine:400; Emesis/NG output:4400] Intake/Output this shift: No intake/output data recorded.  General appearance: alert, cooperative, and no distress GI: Soft, mildly distended.  Nontender.  Lab Results:  Recent Labs    11/20/23 0451 11/20/23 2117 11/21/23 0317  WBC 8.3  --  10.2  HGB 11.3* 12.1* 12.3*  HCT 36.8* 40.4 41.6  PLT 291  --  323   BMET Recent Labs    11/20/23 0451 11/21/23 0317  NA 139 142  K 3.2* 3.5  CL 107 106  CO2 24 25  GLUCOSE 84 98  BUN 13 11  CREATININE 0.92 1.09  CALCIUM 7.9* 8.3*   PT/INR No results for input(s): "LABPROT", "INR" in the last 72 hours.  Studies/Results: DG Abd 1 View Result Date: 11/20/2023 CLINICAL DATA:  Nasogastric tube present. EXAM: ABDOMEN - 1 VIEW COMPARISON:  Radiograph earlier today FINDINGS: Tip and side port of the enteric tube below the diaphragm in the stomach. No bowel dilatation in the included upper abdomen. IMPRESSION: Tip and side port of the enteric tube below the diaphragm in the stomach. Electronically Signed   By: Narda Rutherford M.D.   On: 11/20/2023 16:03   DG Abd 1 View Result Date: 11/20/2023 CLINICAL DATA:  Abdominal pain, check gastric catheter placement EXAM: ABDOMEN - 1 VIEW COMPARISON:  11/18/2023 FINDINGS: Gastric catheter has been advanced into the stomach although the proximal side port lies in the distal esophagus. This should be advanced deeper into the stomach. Mild small bowel dilatation is noted centrally. No free air is noted.  IMPRESSION: Persistent central small bowel dilatation. Gastric catheter as described. This should be advanced deeper into the stomach. Electronically Signed   By: Alcide Clever M.D.   On: 11/20/2023 10:21   DG CHEST PORT 1 VIEW Result Date: 11/20/2023 CLINICAL DATA:  Chest pain and shortness of breath EXAM: PORTABLE CHEST 1 VIEW COMPARISON:  11/07/2022 FINDINGS: Cardiac shadow is stable. Gastric catheter extends into the stomach although the proximal side port lies in the distal esophagus. This could be advanced deeper into the stomach. Lungs are clear bilaterally. No bony abnormality is noted. IMPRESSION: Gastric catheter as described. This could be advanced deeper into the stomach. Electronically Signed   By: Alcide Clever M.D.   On: 11/20/2023 10:20    Anti-infectives: Anti-infectives (From admission, onward)    None       Assessment/Plan: Impression: Small bowel obstruction of unknown etiology.  Patient still has reported high output from the NG tube.  Will get small bowel obstruction protocol study today.  Should this be positive, we will proceed with exploratory laparotomy tomorrow.  Will supplement potassium again.  LOS: 3 days    Franky Macho 11/21/2023

## 2023-11-21 NOTE — Progress Notes (Signed)
[facility] PROGRESS NOTE  Bradley Goodman (DOB: October 14, 1955) ZOX:[id number] PCP: [name], FNP  Brief Narrative: Bradley Goodman is a 68 y.o. male with a history of T2DM, HTN, HLD, right lung NSCLC, hypothyroidism, and an episode of SBO that resolved nonsurgically who presented to the ED on 11/17/2023 with abdominal pain and distention admitted for SBO.  Subjective: Feels better in some ways, less distended. No stool yet, still having significant NG output.   Objective: BP 106/73 (BP Location: Left Arm)   Pulse 99   Temp 98.4 F (36.9 C) (Oral)   Resp 16   Ht 5\' 11"  (1.803 m)   Wt 83 kg   SpO2 95%   BMI 25.52 kg/m   Gen: No distress Pulm: Clear, nonlabored  CV: RRR, borderline tachycardia without MRG or JVD. Trace pitting LE edema. GI: Soft but distended, not tender to light palpation, hypoactive BS. Neuro: Alert and oriented. No new focal deficits. Ext: Warm, no deformities. Skin: No open wounds on visualized skin   Assessment & Plan: SBO: Recurrent. TP in LLQ on initial CT. Abdominal exam stable from prior report, still with significant NG output, clinically obstructed.  - Gastrografin protocol today, appreciate general surgery evaluation.  - Will continue IVF, IV antiemetic prn, IV analgesics prn, and NPO for possible operative exploration 2/14.   Low grade fever: Tmax 100.61F 2/11 PM and temp declining since, WBC wnl since 2/10 with no antimicrobial Tx thus far.  - Monitor off abx - Blood cultures from 2/12 remain NGTD.   T2DM: Well-controlled with recent HbA1c 5.8%.  - Continue dextrose-containing IVF.   HTN:  - Normotensive  - Continue prn metoprolol  HLD:  - Restart statin once taking po  Hypothyroidism:  - If remains NPO much longer, would give synthroid by IV  Adenocarcinoma of lung, stage IV: Patient has stage IV (T1b, N3, M1 C) non-small cell lung cancer. He follows with Dr. Shirline Frees (last OV 10/29/2023) and is currently on maintenance therapy  with Alimta and Keytruda every three weeks. Completed 43 cycles - Continue outpatient f/u.   Hypokalemia: Normalized with supplement. Continue supp given no PO intake and ongoing losses.  Tyrone Nine, MD [facility] [email or web address] 11/21/2023, 12:48 PM

## 2023-11-21 NOTE — Plan of Care (Signed)
  Problem: Clinical Measurements: Goal: Respiratory complications will improve Outcome: Progressing Goal: Cardiovascular complication will be avoided Outcome: Progressing   Problem: Activity: Goal: Risk for activity intolerance will decrease Outcome: Progressing   Problem: Elimination: Goal: Will not experience complications related to urinary retention Outcome: Progressing   Problem: Pain Managment: Goal: General experience of comfort will improve and/or be controlled Outcome: Not Progressing

## 2023-11-21 NOTE — Progress Notes (Signed)
NG tube drained about 3000 brown liquid today.  KUB just performed at beside.  Ambulated to bathroom today for voiding.  Sat in chair for about 15 minutes

## 2023-11-22 ENCOUNTER — Encounter (HOSPITAL_COMMUNITY): Payer: Self-pay | Admitting: Internal Medicine

## 2023-11-22 ENCOUNTER — Encounter (HOSPITAL_COMMUNITY)
Admission: EM | Disposition: A | Discharge status: Home / Self Care | Payer: Self-pay | Source: Home / Self Care | Attending: Student in an Organized Health Care Education/Training Program

## 2023-11-22 DIAGNOSIS — C3491 Malignant neoplasm of unspecified part of right bronchus or lung: Secondary | ICD-10-CM

## 2023-11-22 DIAGNOSIS — E039 Hypothyroidism, unspecified: Secondary | ICD-10-CM | POA: Diagnosis not present

## 2023-11-22 DIAGNOSIS — I10 Essential (primary) hypertension: Secondary | ICD-10-CM | POA: Diagnosis not present

## 2023-11-22 DIAGNOSIS — K56609 Unspecified intestinal obstruction, unspecified as to partial versus complete obstruction: Secondary | ICD-10-CM | POA: Diagnosis not present

## 2023-11-22 LAB — BASIC METABOLIC PANEL
Anion gap: 10 (ref 5–15)
BUN: 14 mg/dL (ref 8–23)
CO2: 27 mmol/L (ref 22–32)
Calcium: 8 mg/dL — ABNORMAL LOW (ref 8.9–10.3)
Chloride: 104 mmol/L (ref 98–111)
Creatinine, Ser: 1.31 mg/dL — ABNORMAL HIGH (ref 0.61–1.24)
GFR, Estimated: >60 mL/min (ref >60)
Glucose, Bld: 137 mg/dL — ABNORMAL HIGH (ref 70–99)
Potassium: 3.3 mmol/L — ABNORMAL LOW (ref 3.5–5.1)
Sodium: 141 mmol/L (ref 135–145)

## 2023-11-22 LAB — CBC
HCT: 41.2 % (ref 39.0–52.0)
Hemoglobin: 12 g/dL — ABNORMAL LOW (ref 13.0–17.0)
MCH: 25.3 pg — ABNORMAL LOW (ref 26.0–34.0)
MCHC: 29.1 g/dL — ABNORMAL LOW (ref 30.0–36.0)
MCV: 86.7 fL (ref 80.0–100.0)
Platelets: 279 10*3/uL (ref 150–400)
RBC: 4.75 MIL/uL (ref 4.22–5.81)
RDW: 19.9 % — ABNORMAL HIGH (ref 11.5–15.5)
WBC: 9.5 10*3/uL (ref 4.0–10.5)
nRBC: 0 % (ref 0.0–0.2)

## 2023-11-22 LAB — GLUCOSE, CAPILLARY
Glucose-Capillary: 114 mg/dL — ABNORMAL HIGH (ref 70–99)
Glucose-Capillary: 116 mg/dL — ABNORMAL HIGH (ref 70–99)

## 2023-11-22 LAB — MAGNESIUM: Magnesium: 1.9 mg/dL (ref 1.7–2.4)

## 2023-11-22 SURGERY — EXPLORATORY LAPAROTOMY
Anesthesia: General

## 2023-11-22 MED ORDER — MAGNESIUM SULFATE 2 GM/50ML IV SOLN
2.0000 g | Freq: Once | INTRAVENOUS | Status: AC
  Administered 2023-11-22: 2 g via INTRAVENOUS
  Filled 2023-11-22: qty 50

## 2023-11-22 MED ORDER — NICOTINE 21 MG/24HR TD PT24: 21.0000 mg | MEDICATED_PATCH | Freq: Every day | TRANSDERMAL | Status: DC | PRN

## 2023-11-22 MED ORDER — KCL-LACTATED RINGERS 20 MEQ/L IV SOLN: INTRAVENOUS | Status: DC

## 2023-11-22 MED ORDER — VENLAFAXINE HCL ER 75 MG PO CP24
150.0000 mg | ORAL_CAPSULE | Freq: Every day | ORAL | Status: DC
  Filled 2023-11-22: qty 2

## 2023-11-22 MED ORDER — POTASSIUM CHLORIDE CRYS ER 20 MEQ PO TBCR
40.0000 meq | EXTENDED_RELEASE_TABLET | Freq: Once | ORAL | Status: AC
  Administered 2023-11-22: 40 meq via ORAL
  Filled 2023-11-22: qty 2

## 2023-11-22 MED ORDER — POTASSIUM CHLORIDE 2 MEQ/ML IV SOLN
INTRAVENOUS | Status: DC
  Filled 2023-11-22 (×2): qty 1000

## 2023-11-22 MED ORDER — CHLORPROMAZINE HCL 25 MG PO TABS
25.0000 mg | ORAL_TABLET | Freq: Three times a day (TID) | ORAL | Status: DC | PRN
  Filled 2023-11-22 (×2): qty 1

## 2023-11-22 MED ORDER — DOCUSATE SODIUM 100 MG PO CAPS
100.0000 mg | ORAL_CAPSULE | Freq: Every day | ORAL | Status: DC
  Administered 2023-11-22: 100 mg via ORAL
  Filled 2023-11-22: qty 1

## 2023-11-22 MED ORDER — VENLAFAXINE HCL ER 75 MG PO CP24
150.0000 mg | ORAL_CAPSULE | Freq: Every day | ORAL | Status: DC
  Administered 2023-11-22: 150 mg via ORAL
  Filled 2023-11-22: qty 2

## 2023-11-22 MED ORDER — ROSUVASTATIN CALCIUM 20 MG PO TABS
20.0000 mg | ORAL_TABLET | Freq: Every evening | ORAL | Status: DC
  Administered 2023-11-22: 20 mg via ORAL
  Filled 2023-11-22: qty 1

## 2023-11-22 MED ORDER — PANTOPRAZOLE SODIUM 40 MG PO TBEC
40.0000 mg | DELAYED_RELEASE_TABLET | Freq: Every evening | ORAL | Status: DC
  Administered 2023-11-22: 40 mg via ORAL
  Filled 2023-11-22: qty 1

## 2023-11-22 MED ORDER — FOLIC ACID 1 MG PO TABS
1.0000 mg | ORAL_TABLET | Freq: Every day | ORAL | Status: DC
  Administered 2023-11-22: 1 mg via ORAL
  Filled 2023-11-22 (×2): qty 1

## 2023-11-22 MED ORDER — DEXAMETHASONE 0.5 MG PO TABS
1.0000 mg | ORAL_TABLET | Freq: Every day | ORAL | Status: DC
  Administered 2023-11-22 – 2023-11-23 (×2): 1 mg via ORAL
  Filled 2023-11-22 (×4): qty 2

## 2023-11-22 MED ORDER — LORATADINE 10 MG PO TABS
10.0000 mg | ORAL_TABLET | Freq: Every evening | ORAL | Status: DC
  Administered 2023-11-22: 10 mg via ORAL
  Filled 2023-11-22: qty 1

## 2023-11-22 MED ORDER — LEVOTHYROXINE SODIUM 88 MCG PO TABS
88.0000 ug | ORAL_TABLET | Freq: Every day | ORAL | Status: DC
  Administered 2023-11-22 – 2023-11-23 (×2): 88 ug via ORAL
  Filled 2023-11-22 (×2): qty 1

## 2023-11-22 MED ORDER — BISACODYL 10 MG RE SUPP
10.0000 mg | Freq: Once | RECTAL | Status: DC
  Filled 2023-11-22: qty 1

## 2023-11-22 MED ORDER — MIRTAZAPINE 15 MG PO TABS
15.0000 mg | ORAL_TABLET | Freq: Every day | ORAL | Status: DC
  Administered 2023-11-22: 15 mg via ORAL
  Filled 2023-11-22: qty 1

## 2023-11-22 NOTE — Progress Notes (Signed)
Ate a good amount of full liquid breakfast tray.  Has ambulated to bathroom twice and had liquid bms.   No increased pain, nausea or distention at this point.  Wife at bedside

## 2023-11-22 NOTE — Progress Notes (Signed)
Subjective: Patient continues to have flatus.  Has not had a bowel movement yet.  Denies any abdominal pain.  Objective: Vital signs in last 24 hours: Temp:  [98 F (36.7 C)-98.7 F (37.1 C)] 98 F (36.7 C) (02/14 0504) Pulse Rate:  [99-111] 109 (02/13 1954) Resp:  [14-20] 20 (02/14 0504) BP: (106-121)/(61-88) 120/76 (02/14 0504) SpO2:  [91 %-98 %] 94 % (02/14 0504) Last BM Date :  (Pt states he had a BM sometime this week, can't remember what day.)  Intake/Output from previous day: 02/13 0701 - 02/14 0700 In: 0  Out: 4300 [Urine:400; Emesis/NG output:3900] Intake/Output this shift: No intake/output data recorded.  General appearance: alert, cooperative, and no distress GI: soft, non-tender; bowel sounds normal; no masses,  no organomegaly  Lab Results:  Recent Labs    11/20/23 0451 11/20/23 2117 11/21/23 0317  WBC 8.3  --  10.2  HGB 11.3* 12.1* 12.3*  HCT 36.8* 40.4 41.6  PLT 291  --  323   BMET Recent Labs    11/20/23 0451 11/21/23 0317  NA 139 142  K 3.2* 3.5  CL 107 106  CO2 24 25  GLUCOSE 84 98  BUN 13 11  CREATININE 0.92 1.09  CALCIUM 7.9* 8.3*   PT/INR No results for input(s): "LABPROT", "INR" in the last 72 hours.  Studies/Results: DG Abd Portable 1V-Small Bowel Obstruction Protocol-initial, 8 hr delay Result Date: 11/21/2023 CLINICAL DATA:  Small-bowel obstruction, 8 hour delay EXAM: PORTABLE ABDOMEN - 1 VIEW COMPARISON:  Radiograph 11/20/2023 at 2:59 p.m. FINDINGS: Enteric tube tip and side-port in the stomach. Enteric contrast is present throughout the colon. No acute osseous abnormality. IMPRESSION: No small-bowel obstruction. Electronically Signed   By: Minerva Fester M.D.   On: 11/21/2023 21:08   DG Abd 1 View Result Date: 11/20/2023 CLINICAL DATA:  Nasogastric tube present. EXAM: ABDOMEN - 1 VIEW COMPARISON:  Radiograph earlier today FINDINGS: Tip and side port of the enteric tube below the diaphragm in the stomach. No bowel dilatation in  the included upper abdomen. IMPRESSION: Tip and side port of the enteric tube below the diaphragm in the stomach. Electronically Signed   By: Narda Rutherford M.D.   On: 11/20/2023 16:03   DG Abd 1 View Result Date: 11/20/2023 CLINICAL DATA:  Abdominal pain, check gastric catheter placement EXAM: ABDOMEN - 1 VIEW COMPARISON:  11/18/2023 FINDINGS: Gastric catheter has been advanced into the stomach although the proximal side port lies in the distal esophagus. This should be advanced deeper into the stomach. Mild small bowel dilatation is noted centrally. No free air is noted. IMPRESSION: Persistent central small bowel dilatation. Gastric catheter as described. This should be advanced deeper into the stomach. Electronically Signed   By: Alcide Clever M.D.   On: 11/20/2023 10:21   DG CHEST PORT 1 VIEW Result Date: 11/20/2023 CLINICAL DATA:  Chest pain and shortness of breath EXAM: PORTABLE CHEST 1 VIEW COMPARISON:  11/07/2022 FINDINGS: Cardiac shadow is stable. Gastric catheter extends into the stomach although the proximal side port lies in the distal esophagus. This could be advanced deeper into the stomach. Lungs are clear bilaterally. No bony abnormality is noted. IMPRESSION: Gastric catheter as described. This could be advanced deeper into the stomach. Electronically Signed   By: Alcide Clever M.D.   On: 11/20/2023 10:20    Anti-infectives: Anti-infectives (From admission, onward)    None       Assessment/Plan: Impression: Small bowel obstruction protocol study negative for obstruction.  There is  contrast throughout the colon.  Despite elevated NG tube output, will remove NG tube and start full liquid diet.  Surgery is canceled for today.  Continue ambulation.  Morning labs pending.  LOS: 4 days    Bradley Goodman 11/22/2023

## 2023-11-22 NOTE — Progress Notes (Signed)
PROGRESS NOTE  Bradley Goodman  FXT:[id number] DOB: 1956-06-15 DOA: 11/17/2023 PCP: [name], FNP   Chief Complaint  Patient presents with   Abdominal Pain   Diarrhea   Level of care: Med-Surg  Brief Admission History:  68 y.o. male with medical history significant of hypertension, hyperlipidemia, hypothyroidism, type 2 diabetes mellitus, adenocarcinoma of right lung who presented to the emergency department due to diarrhea, mid abdominal pain which was of moderate intensity, abdominal distention and bloating which started within the last 24 hours.  Patient has a remote history of small bowel obstruction.  Abdominal pain resolved while in the ED.  Patient denies nausea, vomiting, fever.    In the emergency department, pulse on arrival was 101 bpm, other vital signs were within normal range.  Workup in the ED showed leukocytosis and thrombocytosis.  BMP was normal except for blood glucose of 113 and creatinine of 1.27, lipase was 31, urinalysis was normal.  CT abdomen and pelvis with contrast showed small bowel obstruction with an abrupt transition point within the left lower quadrant.  NG tube was placed.  Hospitalist was asked to admit patient for further evaluation and management.   Assessment and Plan:  SBO: Recurrent - cause not identified - fortunately resolving with supportive measures - NG removed this morning by Dr. Lovell Sheehan with reassuring small bowel follow thru study - surgery team starting full liquids - ambulate room and halls today    Low grade fever: resolved   - Monitor off abx - Blood cultures from 2/12 remain NGTD.    T2DM from steroids:  Well-controlled with recent HbA1c 5.8%.  - Continue IVF CBG (last 3)  Recent Labs    11/21/23 2022 11/22/23 0047 11/22/23 0504  GLUCAP 104* 114* 116*    HTN:  - Normotensive  - Continue prn metoprolol   HLD:  - Restart statin   Hypothyroidism:  - resume home oral levothyroxine    Adenocarcinoma of lung, stage IV:  Patient has stage IV (T1b, N3, M1 C) non-small cell lung cancer. He follows with Dr. Shirline Frees (last OV 10/29/2023) and is currently on maintenance therapy with Alimta and Keytruda every three weeks. Completed 43 cycles - Continue outpatient f/u  - restarting daily oral dexamethasone    Hypokalemia - check Mg - oral and IV replacement ordered - recheck in AM   DVT prophylaxis: enoxaparin  Code Status: Full  Family Communication: wife bedside updated 2/14  Disposition: anticipate home in 1-2 days if continues to improve    Consultants:  Surgery   Procedures:   Antimicrobials:    Subjective: Pt feels relieved that his NG tube removed and tolerating full liquid diet and had liquid BM already. He wants to ambulate.    Objective: Vitals:   11/21/23 1335 11/21/23 1704 11/21/23 1954 11/22/23 0504  BP: 106/75 111/84 115/61 120/76  Pulse: (!) 107 (!) 111 (!) 109   Resp: 14 16 18 20   Temp: 98.4 F (36.9 C) 98.7 F (37.1 C) 98.6 F (37 C) 98 F (36.7 C)  TempSrc: Oral Oral Oral Oral  SpO2: 98% 91% 96% 94%  Weight:      Height:        Intake/Output Summary (Last 24 hours) at 11/22/2023 1125 Last data filed at 11/22/2023 0915 Gross per 24 hour  Intake 240 ml  Output 3600 ml  Net -3360 ml   Filed Weights   11/17/23 2359  Weight: 83 kg   Examination:  General exam: Appears calm and comfortable  Respiratory system: Clear to auscultation. Respiratory effort normal. Cardiovascular system: normal S1 & S2 heard. No JVD, murmurs, rubs, gallops or clicks. No pedal edema. Gastrointestinal system: Abdomen is nondistended, soft and nontender. No organomegaly or masses felt. Normal bowel sounds heard. Central nervous system: Alert and oriented. No focal neurological deficits. Extremities: Symmetric 5 x 5 power. Skin: No rashes, lesions or ulcers. Psychiatry: Judgement and insight appear normal. Mood & affect appropriate.   Data Reviewed: I have personally reviewed following labs and  imaging studies  CBC: Recent Labs  Lab 11/18/23 0014 11/19/23 0350 11/20/23 0451 11/20/23 2117 11/21/23 0317 11/22/23 0745  WBC 18.5* 10.2 8.3  --  10.2 9.5  HGB 14.1 11.3* 11.3* 12.1* 12.3* 12.0*  HCT 48.1 38.6* 36.8* 40.4 41.6 41.2  MCV 86.7 85.8 85.2  --  86.1 86.7  PLT 404* 300 291  --  323 279    Basic Metabolic Panel: Recent Labs  Lab 11/18/23 0014 11/19/23 0350 11/20/23 0451 11/21/23 0317 11/22/23 0745  NA 139 138 139 142 141  K 3.8 3.4* 3.2* 3.5 3.3*  CL 103 108 107 106 104  CO2 25 22 24 25 27   GLUCOSE 113* 70 84 98 137*  BUN 20 17 13 11 14   CREATININE 1.27* 0.96 0.92 1.09 1.31*  CALCIUM 10.0 8.0* 7.9* 8.3* 8.0*  MG  --  1.6*  --   --  1.9  PHOS  --  2.3*  --   --   --     CBG: Recent Labs  Lab 11/21/23 2022 11/22/23 0047 11/22/23 0504  GLUCAP 104* 114* 116*    Recent Results (from the past 240 hours)  Culture, blood (Routine X 2) w Reflex to ID Panel     Status: None (Preliminary result)   Collection Time: 11/20/23 12:24 PM   Specimen: BLOOD  Result Value Ref Range Status   Specimen Description BLOOD BLOOD LEFT ARM  Final   Special Requests   Final    BOTTLES DRAWN AEROBIC AND ANAEROBIC Blood Culture adequate volume   Culture   Final    NO GROWTH 2 DAYS Performed at [facility], [address] [place], [place] [address]    Report Status PENDING  Incomplete  Culture, blood (Routine X 2) w Reflex to ID Panel     Status: None (Preliminary result)   Collection Time: 11/20/23 12:24 PM   Specimen: BLOOD  Result Value Ref Range Status   Specimen Description BLOOD LFOA  Final   Special Requests   Final    Blood Culture adequate volume BOTTLES DRAWN AEROBIC AND ANAEROBIC   Culture   Final    NO GROWTH 2 DAYS Performed at [facility], [address] [place], [place] [address]    Report Status PENDING  Incomplete     Radiology Studies: DG Abd Portable 1V-Small Bowel Obstruction Protocol-initial, 8 hr delay Result Date: 11/21/2023 CLINICAL  DATA:  Small-bowel obstruction, 8 hour delay EXAM: PORTABLE ABDOMEN - 1 VIEW COMPARISON:  Radiograph 11/20/2023 at 2:59 p.m. FINDINGS: Enteric tube tip and side-port in the stomach. Enteric contrast is present throughout the colon. No acute osseous abnormality. IMPRESSION: No small-bowel obstruction. Electronically Signed   By: Minerva Fester M.D.   On: 11/21/2023 21:08   DG Abd 1 View Result Date: 11/20/2023 CLINICAL DATA:  Nasogastric tube present. EXAM: ABDOMEN - 1 VIEW COMPARISON:  Radiograph earlier today FINDINGS: Tip and side port of the enteric tube below the diaphragm in the stomach. No bowel dilatation in  the included upper abdomen. IMPRESSION: Tip and side port of the enteric tube below the diaphragm in the stomach. Electronically Signed   By: Narda Rutherford M.D.   On: 11/20/2023 16:03    Scheduled Meds:  acetaminophen  650 mg Rectal Once   bisacodyl  10 mg Rectal Once   dexamethasone  1 mg Oral Q breakfast   docusate sodium  100 mg Oral QHS   enoxaparin (LOVENOX) injection  40 mg Subcutaneous Q24H   folic acid  1 mg Oral Daily   levothyroxine  88 mcg Oral QAC breakfast   loratadine  10 mg Oral QPM   metoprolol tartrate  2.5 mg Intravenous Q8H   mirtazapine  15 mg Oral QHS   pantoprazole  40 mg Oral QPM   rosuvastatin  20 mg Oral QPM   venlafaxine XR  150 mg Oral QHS   Continuous Infusions:  lactated ringers 1,000 mL with potassium chloride 20 mEq infusion 50 mL/hr at 11/22/23 0953    LOS: 4 days   Time spent: 55 mins  Trevionne Advani, MD How to contact the [facility] Attending or Consulting provider 7A - 7P or covering provider during after hours 7P -7A, for this patient?  Check the care team in [facility] and look for a) attending/consulting [facility] provider listed and b) the [facility] team listed Log into [email or web address] to find provider on call.  Locate the [facility] provider you are looking for under [facility] and page to a number that you can be directly reached. If you still have  difficulty reaching the provider, please page the [facility] (Director on Call) for the Hospitalists listed on amion for assistance.  11/22/2023, 11:25 AM

## 2023-11-22 NOTE — Plan of Care (Addendum)
Pts NG-tube is in place and intact. Output has been constant throughout night, ranging from 700-800 mls.   Problem: Education: Goal: Knowledge of General Education information will improve Description: Including pain rating scale, medication(s)/side effects and non-pharmacologic comfort measures Outcome: Progressing   Problem: Clinical Measurements: Goal: Ability to maintain clinical measurements within normal limits will improve Outcome: Progressing Goal: Will remain free from infection Outcome: Progressing Goal: Diagnostic test results will improve Outcome: Progressing   Problem: Pain Managment: Goal: General experience of comfort will improve and/or be controlled Outcome: Progressing   Problem: Health Behavior/Discharge Planning: Goal: Ability to manage health-related needs will improve Outcome: Progressing   Problem: Metabolic: Goal: Ability to maintain appropriate glucose levels will improve Outcome: Progressing

## 2023-11-22 NOTE — Progress Notes (Signed)
Has been eating at least half of meal trays and no ill effects.  Three watery bms today.  Ambulating in hallway

## 2023-11-22 NOTE — Hospital Course (Signed)
68 y.o. male with medical history significant of hypertension, hyperlipidemia, hypothyroidism, type 2 diabetes mellitus, adenocarcinoma of right lung who presented to the emergency department due to diarrhea, mid abdominal pain which was of moderate intensity, abdominal distention and bloating which started within the last 24 hours.  Patient has a remote history of small bowel obstruction.  Abdominal pain resolved while in the ED.  Patient denies nausea, vomiting, fever.    In the emergency department, pulse on arrival was 101 bpm, other vital signs were within normal range.  Workup in the ED showed leukocytosis and thrombocytosis.  BMP was normal except for blood glucose of 113 and creatinine of 1.27, lipase was 31, urinalysis was normal.  CT abdomen and pelvis with contrast showed small bowel obstruction with an abrupt transition point within the left lower quadrant.  NG tube was placed.  Hospitalist was asked to admit patient for further evaluation and management.

## 2023-11-23 ENCOUNTER — Other Ambulatory Visit: Payer: Self-pay | Admitting: "Endocrinology

## 2023-11-23 DIAGNOSIS — K56609 Unspecified intestinal obstruction, unspecified as to partial versus complete obstruction: Secondary | ICD-10-CM | POA: Diagnosis not present

## 2023-11-23 DIAGNOSIS — D72829 Elevated white blood cell count, unspecified: Secondary | ICD-10-CM | POA: Diagnosis not present

## 2023-11-23 DIAGNOSIS — E039 Hypothyroidism, unspecified: Secondary | ICD-10-CM

## 2023-11-23 DIAGNOSIS — C3491 Malignant neoplasm of unspecified part of right bronchus or lung: Secondary | ICD-10-CM | POA: Diagnosis not present

## 2023-11-23 DIAGNOSIS — I10 Essential (primary) hypertension: Secondary | ICD-10-CM | POA: Diagnosis not present

## 2023-11-23 LAB — CBC
HCT: 32.6 % — ABNORMAL LOW (ref 39.0–52.0)
Hemoglobin: 9.9 g/dL — ABNORMAL LOW (ref 13.0–17.0)
MCH: 26.1 pg (ref 26.0–34.0)
MCHC: 30.4 g/dL (ref 30.0–36.0)
MCV: 86 fL (ref 80.0–100.0)
Platelets: 293 10*3/uL (ref 150–400)
RBC: 3.79 MIL/uL — ABNORMAL LOW (ref 4.22–5.81)
RDW: 19 % — ABNORMAL HIGH (ref 11.5–15.5)
WBC: 8 10*3/uL (ref 4.0–10.5)
nRBC: 0 % (ref 0.0–0.2)

## 2023-11-23 LAB — BASIC METABOLIC PANEL
Anion gap: 8 (ref 5–15)
BUN: 11 mg/dL (ref 8–23)
CO2: 25 mmol/L (ref 22–32)
Calcium: 8 mg/dL — ABNORMAL LOW (ref 8.9–10.3)
Chloride: 104 mmol/L (ref 98–111)
Creatinine, Ser: 1.08 mg/dL (ref 0.61–1.24)
GFR, Estimated: >60 mL/min (ref >60)
Glucose, Bld: 113 mg/dL — ABNORMAL HIGH (ref 70–99)
Potassium: 4 mmol/L (ref 3.5–5.1)
Sodium: 137 mmol/L (ref 135–145)

## 2023-11-23 LAB — MAGNESIUM: Magnesium: 2.3 mg/dL (ref 1.7–2.4)

## 2023-11-23 MED ORDER — VENLAFAXINE HCL ER 75 MG PO CP24: 150.0000 mg | ORAL_CAPSULE | Freq: Every day | ORAL | Status: DC

## 2023-11-23 NOTE — Discharge Instructions (Signed)
Soft foods recommended for a couple of days then advance to regular diet as tolerated    IMPORTANT INFORMATION: PAY CLOSE ATTENTION   PHYSICIAN DISCHARGE INSTRUCTIONS  Follow with Primary care provider  [name], Doralee Albino, FNP  and other consultants as instructed by your Hospitalist Physician  SEEK MEDICAL CARE OR RETURN TO EMERGENCY ROOM IF SYMPTOMS COME BACK, WORSEN OR NEW PROBLEM DEVELOPS   Please note: You were cared for by a hospitalist during your hospital stay. Every effort will be made to forward records to your primary care provider.  You can request that your primary care provider send for your hospital records if they have not received them.  Once you are discharged, your primary care physician will handle any further medical issues. Please note that NO REFILLS for any discharge medications will be authorized once you are discharged, as it is imperative that you return to your primary care physician (or establish a relationship with a primary care physician if you do not have one) for your post hospital discharge needs so that they can reassess your need for medications and monitor your lab values.  Please get a complete blood count and chemistry panel checked by your Primary MD at your next visit, and again as instructed by your Primary MD.  Get Medicines reviewed and adjusted: Please take all your medications with you for your next visit with your Primary MD  Laboratory/radiological data: Please request your Primary MD to go over all hospital tests and procedure/radiological results at the follow up, please ask your primary care provider to get all Hospital records sent to his/her office.  In some cases, they will be blood work, cultures and biopsy results pending at the time of your discharge. Please request that your primary care provider follow up on these results.  If you are diabetic, please bring your blood sugar readings with you to your follow up appointment with primary  care.    Please call and make your follow up appointments as soon as possible.    Also Note the following: If you experience worsening of your admission symptoms, develop shortness of breath, life threatening emergency, suicidal or homicidal thoughts you must seek medical attention immediately by calling 911 or calling your MD immediately  if symptoms less severe.  You must read complete instructions/literature along with all the possible adverse reactions/side effects for all the Medicines you take and that have been prescribed to you. Take any new Medicines after you have completely understood and accpet all the possible adverse reactions/side effects.   Do not drive when taking Pain medications or sleeping medications (Benzodiazepines)  Do not take more than prescribed Pain, Sleep and Anxiety Medications. It is not advisable to combine anxiety,sleep and pain medications without talking with your primary care practitioner  Special Instructions: If you have smoked or chewed Tobacco  in the last 2 yrs please stop smoking, stop any regular Alcohol  and or any Recreational drug use.  Wear Seat belts while driving.  Do not drive if taking any narcotic, mind altering or controlled substances or recreational drugs or alcohol.

## 2023-11-23 NOTE — Discharge Summary (Signed)
Physician Discharge Summary  Bradley Goodman UXL:[id number] DOB: [birth year] DOA: 11/17/2023  PCP: [name], FNP Inpatient Surgeon consultant: Alvina Chou date: 11/17/2023 Discharge date: 11/23/2023  Admitted From:  Home  Disposition:  Home   Recommendations for Outpatient Follow-up:  Follow up with PCP in 1 weeks Please CBC in one week on outpatient follow up   Discharge Condition: STABLE   CODE STATUS: FULL DIET: Soft foods for the next 1 to 2 days, advance as tolerated to regular  Brief Hospitalization Summary: Please see all hospital notes, images, labs for full details of the hospitalization. Admission provider HPI:  68 y.o. male with medical history significant of hypertension, hyperlipidemia, hypothyroidism, type 2 diabetes mellitus, adenocarcinoma of right lung who presented to the emergency department due to diarrhea, mid abdominal pain which was of moderate intensity, abdominal distention and bloating which started within the last 24 hours.  Patient has a remote history of small bowel obstruction.  Abdominal pain resolved while in the ED.  Patient denies nausea, vomiting, fever.    In the emergency department, pulse on arrival was 101 bpm, other vital signs were within normal range.  Workup in the ED showed leukocytosis and thrombocytosis.  BMP was normal except for blood glucose of 113 and creatinine of 1.27, lipase was 31, urinalysis was normal.  CT abdomen and pelvis with contrast showed small bowel obstruction with an abrupt transition point within the left lower quadrant.  NG tube was placed.  Hospitalist was asked to admit patient for further evaluation and management.  Hospital Course by problem list   SBO: Recurrent - cause not identified - fortunately resolving with supportive measures - NG removed this morning by Dr. Lovell Sheehan with reassuring small bowel follow thru study - surgery team starting full liquids, advanced to regular diet and patient tolerated -  ambulated room and halls  - discussed with surgeon Dr. Lovell Sheehan and recommendation is discharge home today if regular diet tolerated and he has tolerated the regular diet.    Low grade fever: resolved   - Monitored off abx - Blood cultures from 2/12 remain NGTD.    T2DM from steroids:  Well-controlled with recent HbA1c 5.8%.  - Continue IVF CBG (last 3)  Recent Labs    11/21/23 2022 11/22/23 0047 11/22/23 0504  GLUCAP 104* 114* 116*     HTN:  - Normotensive  - Continue prn metoprolol   HLD:  - Restart statin   Hypothyroidism:  - resume home oral levothyroxine    Adenocarcinoma of lung, stage IV: Patient has stage IV (T1b, N3, M1 C) non-small cell lung cancer. He follows with Dr. Shirline Frees (last OV 10/29/2023) and is currently on maintenance therapy with Alimta and Keytruda every three weeks. Completed 43 cycles - Continue outpatient f/u with oncologist Dr. Arbutus Ped  - restarting daily oral dexamethasone    Hypokalemia - oral and IV replacement ordered and repleted   Discharge Diagnoses:  Principal Problem:   Small bowel obstruction (HCC) Active Problems:   Essential hypertension   Mixed hyperlipidemia   Adenocarcinoma of right lung, stage 4 (HCC)   Acquired hypothyroidism   Type 2 diabetes mellitus with hyperglycemia, without long-term current use of insulin (HCC)   Leukocytosis   Thrombocytosis   Discharge Instructions:  Allergies as of 11/23/2023   No Known Allergies      Medication List     TAKE these medications    B-D 3CC LUER-LOK SYR 21GX1-1/2 21G X 1-1/2" 3 ML Misc Generic drug:  SYRINGE-NEEDLE (DISP) 3 ML Use to inject testosterone every week   cholecalciferol 1000 units tablet Commonly known as: VITAMIN D Take 1,000 Units by mouth every evening.   dexamethasone 1 MG tablet Commonly known as: DECADRON Take 1 tablet (1 mg total) by mouth daily with breakfast.   docusate sodium 100 MG capsule Commonly known as: COLACE Take 100 mg by mouth  daily.   folic acid 1 MG tablet Commonly known as: FOLVITE Take 1 tablet (1 mg total) by mouth daily.   FreeStyle Libre 3 Sensor Misc Place 1 sensor on the skin every 14 days. Use to check glucose continuously   Krill Oil 300 MG Caps Take 300 mg by mouth every evening.   levothyroxine 88 MCG tablet Commonly known as: SYNTHROID Take 1 tablet (88 mcg total) by mouth daily before breakfast.   loratadine 10 MG tablet Commonly known as: CLARITIN Take 10 mg by mouth every evening.   losartan 50 MG tablet Commonly known as: COZAAR Take 1.5 tablets (75 mg total) by mouth daily.   mirtazapine 15 MG tablet Commonly known as: REMERON Take 1 tablet (15 mg total) by mouth at bedtime.   MULTIVITAMIN GUMMIES ADULT PO Take 2 each by mouth daily.   Ozempic (0.25 or 0.5 MG/DOSE) 2 MG/3ML Sopn Generic drug: Semaglutide(0.25 or 0.5MG /DOS) Inject 0.5 mg into the skin once a week.   pantoprazole 40 MG tablet Commonly known as: PROTONIX Take 1 tablet (40 mg total) by mouth every evening.   PRESCRIPTION MEDICATION Testosterone injection   rosuvastatin 20 MG tablet Commonly known as: CRESTOR Take 1 tablet (20 mg total) by mouth daily.   testosterone cypionate 100 MG/ML injection Commonly known as: DEPOTESTOTERONE CYPIONATE Take 50mg  ( 0.3ml) and 100mg  ( 1ml) into muscle alternatively weekly.   venlafaxine XR 75 MG 24 hr capsule Commonly known as: EFFEXOR-XR Take 2 capsules (150 mg total) by mouth daily with breakfast.        Follow-up Information     [name], Doralee Albino, FNP Follow up.   Specialty: Family Medicine Contact information: [address] [place] [address] [phone]                No Known Allergies Allergies as of 11/23/2023   No Known Allergies      Medication List     TAKE these medications    B-D 3CC LUER-LOK SYR 21GX1-1/2 21G X 1-1/2" 3 ML Misc Generic drug: SYRINGE-NEEDLE (DISP) 3 ML Use to inject testosterone every week    cholecalciferol 1000 units tablet Commonly known as: VITAMIN D Take 1,000 Units by mouth every evening.   dexamethasone 1 MG tablet Commonly known as: DECADRON Take 1 tablet (1 mg total) by mouth daily with breakfast.   docusate sodium 100 MG capsule Commonly known as: COLACE Take 100 mg by mouth daily.   folic acid 1 MG tablet Commonly known as: FOLVITE Take 1 tablet (1 mg total) by mouth daily.   FreeStyle Libre 3 Sensor Misc Place 1 sensor on the skin every 14 days. Use to check glucose continuously   Krill Oil 300 MG Caps Take 300 mg by mouth every evening.   levothyroxine 88 MCG tablet Commonly known as: SYNTHROID Take 1 tablet (88 mcg total) by mouth daily before breakfast.   loratadine 10 MG tablet Commonly known as: CLARITIN Take 10 mg by mouth every evening.   losartan 50 MG tablet Commonly known as: COZAAR Take 1.5 tablets (75 mg total) by mouth daily.  mirtazapine 15 MG tablet Commonly known as: REMERON Take 1 tablet (15 mg total) by mouth at bedtime.   MULTIVITAMIN GUMMIES ADULT PO Take 2 each by mouth daily.   Ozempic (0.25 or 0.5 MG/DOSE) 2 MG/3ML Sopn Generic drug: Semaglutide(0.25 or 0.5MG /DOS) Inject 0.5 mg into the skin once a week.   pantoprazole 40 MG tablet Commonly known as: PROTONIX Take 1 tablet (40 mg total) by mouth every evening.   PRESCRIPTION MEDICATION Testosterone injection   rosuvastatin 20 MG tablet Commonly known as: CRESTOR Take 1 tablet (20 mg total) by mouth daily.   testosterone cypionate 100 MG/ML injection Commonly known as: DEPOTESTOTERONE CYPIONATE Take 50mg  ( 0.74ml) and 100mg  ( 1ml) into muscle alternatively weekly.   venlafaxine XR 75 MG 24 hr capsule Commonly known as: EFFEXOR-XR Take 2 capsules (150 mg total) by mouth daily with breakfast.        Procedures/Studies: CT CHEST ABDOMEN PELVIS W CONTRAST Result Date: 11/22/2023 CLINICAL DATA:  Non-small cell lung cancer. Restaging. * Tracking Code: BO *  EXAM: CT CHEST, ABDOMEN, AND PELVIS WITH CONTRAST TECHNIQUE: Multidetector CT imaging of the chest, abdomen and pelvis was performed following the standard protocol during bolus administration of intravenous contrast. RADIATION DOSE REDUCTION: This exam was performed according to the departmental dose-optimization program which includes automated exposure control, adjustment of the mA and/or kV according to patient size and/or use of iterative reconstruction technique. CONTRAST:  OMNIPAQUE IOHEXOL 300 MG/ML  SOLN COMPARISON:  08/13/2023 FINDINGS: CT CHEST FINDINGS Cardiovascular: Heart size is normal. Small pericardial effusion is identified which is unchanged in volume from the previous exam. Aortic atherosclerosis. Coronary artery calcifications. Mediastinum/Nodes: Thyroid gland, trachea and esophagus are normal. No enlarged axillary, supraclavicular, mediastinal, or hilar lymph nodes. Lungs/Pleura: Multifocal pulmonary nodules are identified predominantly within the right upper lobe which appear increased in multiplicity compared with the previous exam. Index nodules include: -peripheral nodule within the right upper lobe measuring 6 mm is stable in the interval, image 59/6. -Irregular non solid nodule within the right upper lobe measures 6 mm, image 79/6. Stable compared with the previous exam. -New nodule within the posterior right upper lobe measures 1.2 cm, image 64/6. Unchanged in the interval. -New nodule within the anterolateral right upper lobe measures 6 mm, image 61/6. -New nodule within the medial posteromedial right upper lobe measures 9 mm, image 61/6. -Two sub solid nodules within the lateral left upper lobe are unchanged measuring 0.5 cm and 0.5 cm, image 83/6. Musculoskeletal: No chest wall mass or suspicious bone lesions identified. CT ABDOMEN PELVIS FINDINGS Hepatobiliary: Small low-density lesion within the inferior right lobe of liver is stable measuring 0.7 cm, image 79/2. 4 mm low  density lesion within the posterior and inferior right lobe of liver is also unchanged, image 71/2. These are both technically too small to reliably characterize. No suspicious liver lesions noted at this time. Gallbladder appears normal. No bile duct dilatation. Pancreas: Unremarkable. No pancreatic ductal dilatation or surrounding inflammatory changes. Spleen: Normal in size without focal abnormality. Adrenals/Urinary Tract: Normal adrenal glands. The right kidney is normal. Nonobstructing stone within upper pole of the left kidney measures 0.9 cm. No kidney mass or hydronephrosis identified bilaterally. Urinary bladder appears normal. Stomach/Bowel: Stomach is within normal limits. No bowel wall thickening, inflammation. Mildly dilated loops of small bowel are noted within the anterior abdomen measuring up to 2.9 cm. Vascular/Lymphatic: Aortic atherosclerosis. No aneurysm. No signs of abdominopelvic adenopathy. Reproductive: No mass identified. Other: No free fluid or fluid collections  identified. Musculoskeletal: No acute or significant osseous findings. L5-S1 degenerative disc disease. IMPRESSION: 1. Multifocal pulmonary nodules are identified predominantly within the right upper lobe which appear increased in multiplicity compared with the previous exam. Findings are concerning for worsening pulmonary metastatic disease. 2. No signs of metastatic disease within the abdomen or pelvis. 3. Mildly dilated loops of small bowel are noted within the anterior abdomen measuring up to 2.9 cm. Findings are nonspecific and may be related to ileus or enteritis. 4. Nonobstructing left renal calculus. 5. Coronary artery calcifications. 6.  Aortic Atherosclerosis (ICD10-I70.0). Electronically Signed   By: Signa Kell M.D.   On: 11/22/2023 16:58   DG Abd Portable 1V-Small Bowel Obstruction Protocol-initial, 8 hr delay Result Date: 11/21/2023 CLINICAL DATA:  Small-bowel obstruction, 8 hour delay EXAM: PORTABLE ABDOMEN - 1  VIEW COMPARISON:  Radiograph 11/20/2023 at 2:59 p.m. FINDINGS: Enteric tube tip and side-port in the stomach. Enteric contrast is present throughout the colon. No acute osseous abnormality. IMPRESSION: No small-bowel obstruction. Electronically Signed   By: Minerva Fester M.D.   On: 11/21/2023 21:08   DG Abd 1 View Result Date: 11/20/2023 CLINICAL DATA:  Nasogastric tube present. EXAM: ABDOMEN - 1 VIEW COMPARISON:  Radiograph earlier today FINDINGS: Tip and side port of the enteric tube below the diaphragm in the stomach. No bowel dilatation in the included upper abdomen. IMPRESSION: Tip and side port of the enteric tube below the diaphragm in the stomach. Electronically Signed   By: Narda Rutherford M.D.   On: 11/20/2023 16:03   DG Abd 1 View Result Date: 11/20/2023 CLINICAL DATA:  Abdominal pain, check gastric catheter placement EXAM: ABDOMEN - 1 VIEW COMPARISON:  11/18/2023 FINDINGS: Gastric catheter has been advanced into the stomach although the proximal side port lies in the distal esophagus. This should be advanced deeper into the stomach. Mild small bowel dilatation is noted centrally. No free air is noted. IMPRESSION: Persistent central small bowel dilatation. Gastric catheter as described. This should be advanced deeper into the stomach. Electronically Signed   By: Alcide Clever M.D.   On: 11/20/2023 10:21   DG CHEST PORT 1 VIEW Result Date: 11/20/2023 CLINICAL DATA:  Chest pain and shortness of breath EXAM: PORTABLE CHEST 1 VIEW COMPARISON:  11/07/2022 FINDINGS: Cardiac shadow is stable. Gastric catheter extends into the stomach although the proximal side port lies in the distal esophagus. This could be advanced deeper into the stomach. Lungs are clear bilaterally. No bony abnormality is noted. IMPRESSION: Gastric catheter as described. This could be advanced deeper into the stomach. Electronically Signed   By: Alcide Clever M.D.   On: 11/20/2023 10:20   DG Abd Portable 1 View Result Date:  11/18/2023 CLINICAL DATA:  68 year old male status post nasogastric tube placement. EXAM: PORTABLE ABDOMEN - 1 VIEW COMPARISON:  CT of the abdomen and pelvis 11/18/2023. FINDINGS: Tip of nasogastric tube is in the distal esophagus shortly before the gastroesophageal junction. Numerous gas-filled loops of small bowel are noted throughout the central abdomen measuring up to 5.1 cm in diameter. Paucity of colonic gas. IMPRESSION: Tip of nasogastric tube is in the distal esophagus and should be advanced approximately 15 cm for more optimal placement. Electronically Signed   By: Trudie Reed M.D.   On: 11/18/2023 05:25   CT ABDOMEN PELVIS W CONTRAST Result Date: 11/18/2023 CLINICAL DATA:  Abdominal pain and diarrhea. EXAM: CT ABDOMEN AND PELVIS WITH CONTRAST TECHNIQUE: Multidetector CT imaging of the abdomen and pelvis was performed using the standard protocol  following bolus administration of intravenous contrast. RADIATION DOSE REDUCTION: This exam was performed according to the departmental dose-optimization program which includes automated exposure control, adjustment of the mA and/or kV according to patient size and/or use of iterative reconstruction technique. CONTRAST:  OMNIPAQUE IOHEXOL 300 MG/ML  SOLN COMPARISON:  November 11, 2023 FINDINGS: Lower chest: There is a small, stable pericardial effusion. Hepatobiliary: A small, stable hepatic cyst is seen within the right lobe of the liver. No gallstones, gallbladder wall thickening, or biliary dilatation. Pancreas: Unremarkable. No pancreatic ductal dilatation or surrounding inflammatory changes. Spleen: Normal in size without focal abnormality. Adrenals/Urinary Tract: Adrenal glands are unremarkable. Kidneys are normal in size, without obstructing renal calculi, focal lesion, or hydronephrosis. A 9 mm cluster of nonobstructing renal calculi are seen within the left kidney. Bladder is unremarkable. Stomach/Bowel: Stomach is within normal limits.  Appendix appears normal. Multiple dilated small bowel loops are seen throughout the abdomen and pelvis (maximum small bowel diameter of approximately 3.7 cm). An abrupt transition joint is seen within the left lower quadrant (best seen on coronal reformatted images 55 through 59, CT series 5). Vascular/Lymphatic: Aortic atherosclerosis. No enlarged abdominal or pelvic lymph nodes. Reproductive: There is mild to moderate severity prostate gland enlargement and prostate calcification. Other: No abdominal wall hernia or abnormality. No abdominopelvic ascites. Musculoskeletal: No acute or significant osseous findings. IMPRESSION: 1. Small bowel obstruction with an abrupt transition point within the left lower quadrant. 2. Small, stable pericardial effusion. 3. Nonobstructing left renal calculi. 4. Mild to moderate severity prostate gland enlargement. 5. Aortic atherosclerosis. Aortic Atherosclerosis (ICD10-I70.0). Electronically Signed   By: Aram Candela M.D.   On: 11/18/2023 01:57     Subjective: Patient has had multiple bowel movements in the last 24 hours and tolerating full liquids and regular diet.  He is eager to go home today.  Discharge Exam: Vitals:   11/22/23 1959 11/23/23 0507  BP: 115/86 125/83  Pulse: 93 86  Resp: 16 16  Temp: 98.3 F (36.8 C) 97.8 F (36.6 C)  SpO2: 95% 94%   Vitals:   11/22/23 0504 11/22/23 1439 11/22/23 1959 11/23/23 0507  BP: 120/76 119/77 115/86 125/83  Pulse:  (!) 102 93 86  Resp: 20 20 16 16   Temp: 98 F (36.7 C) 98.5 F (36.9 C) 98.3 F (36.8 C) 97.8 F (36.6 C)  TempSrc: Oral Oral Oral Oral  SpO2: 94% 96% 95% 94%  Weight:      Height:       General: Pt is alert, awake, not in acute distress Cardiovascular: normal S1/S2 +, no rubs, no gallops Respiratory: CTA bilaterally, no wheezing, no rhonchi Abdominal: Soft, NT, ND, bowel sounds + Extremities: no edema, no cyanosis   The results of significant diagnostics from this hospitalization  (including imaging, microbiology, ancillary and laboratory) are listed below for reference.     Microbiology: Recent Results (from the past 240 hours)  Culture, blood (Routine X 2) w Reflex to ID Panel     Status: None (Preliminary result)   Collection Time: 11/20/23 12:24 PM   Specimen: BLOOD  Result Value Ref Range Status   Specimen Description BLOOD BLOOD LEFT ARM  Final   Special Requests   Final    BOTTLES DRAWN AEROBIC AND ANAEROBIC Blood Culture adequate volume   Culture   Final    NO GROWTH 3 DAYS Performed at [facility], [address], [place], [place] [address]    Report Status PENDING  Incomplete  Culture, blood (Routine  X 2) w Reflex to ID Panel     Status: None (Preliminary result)   Collection Time: 11/20/23 12:24 PM   Specimen: BLOOD  Result Value Ref Range Status   Specimen Description BLOOD LFOA  Final   Special Requests   Final    Blood Culture adequate volume BOTTLES DRAWN AEROBIC AND ANAEROBIC   Culture   Final    NO GROWTH 3 DAYS Performed at [facility], [address] [place], [place] [address]    Report Status PENDING  Incomplete     Labs: BNP (last 3 results) No results for input(s): "BNP" in the last 8760 hours. Basic Metabolic Panel: Recent Labs  Lab 11/19/23 0350 11/20/23 0451 11/21/23 0317 11/22/23 0745 11/23/23 0424  NA 138 139 142 141 137  K 3.4* 3.2* 3.5 3.3* 4.0  CL 108 107 106 104 104  CO2 22 24 25 27 25   GLUCOSE 70 84 98 137* 113*  BUN 17 13 11 14 11   CREATININE 0.96 0.92 1.09 1.31* 1.08  CALCIUM 8.0* 7.9* 8.3* 8.0* 8.0*  MG 1.6*  --   --  1.9 2.3  PHOS 2.3*  --   --   --   --    Liver Function Tests: Recent Labs  Lab 11/18/23 0014 11/19/23 0350  AST 20 14*  ALT 18 13  ALKPHOS 56 48  BILITOT 0.6 0.5  PROT 7.3 5.5*  ALBUMIN 3.7 2.8*   Recent Labs  Lab 11/18/23 0014  LIPASE 31   No results for input(s): "AMMONIA" in the last 168 hours. CBC: Recent Labs  Lab 11/19/23 0350 11/20/23 0451 11/20/23 2117  11/21/23 0317 11/22/23 0745 11/23/23 0424  WBC 10.2 8.3  --  10.2 9.5 8.0  HGB 11.3* 11.3* 12.1* 12.3* 12.0* 9.9*  HCT 38.6* 36.8* 40.4 41.6 41.2 32.6*  MCV 85.8 85.2  --  86.1 86.7 86.0  PLT 300 291  --  323 279 293   Cardiac Enzymes: No results for input(s): "CKTOTAL", "CKMB", "CKMBINDEX", "TROPONINI" in the last 168 hours. BNP: Invalid input(s): "POCBNP" CBG: Recent Labs  Lab 11/21/23 2022 11/22/23 0047 11/22/23 0504  GLUCAP 104* 114* 116*   D-Dimer No results for input(s): "DDIMER" in the last 72 hours. Hgb A1c No results for input(s): "HGBA1C" in the last 72 hours. Lipid Profile No results for input(s): "CHOL", "HDL", "LDLCALC", "TRIG", "CHOLHDL", "LDLDIRECT" in the last 72 hours. Thyroid function studies No results for input(s): "TSH", "T4TOTAL", "T3FREE", "THYROIDAB" in the last 72 hours.  Invalid input(s): "FREET3" Anemia work up No results for input(s): "VITAMINB12", "FOLATE", "FERRITIN", "TIBC", "IRON", "RETICCTPCT" in the last 72 hours. Urinalysis    Component Value Date/Time   COLORURINE YELLOW 11/18/2023 0002   APPEARANCEUR CLEAR 11/18/2023 0002   APPEARANCEUR Clear 12/11/2017 1117   LABSPEC 1.005 11/18/2023 0002   PHURINE 5.0 11/18/2023 0002   GLUCOSEU NEGATIVE 11/18/2023 0002   HGBUR NEGATIVE 11/18/2023 0002   BILIRUBINUR NEGATIVE 11/18/2023 0002   BILIRUBINUR Negative 12/11/2017 1117   KETONESUR NEGATIVE 11/18/2023 0002   PROTEINUR NEGATIVE 11/18/2023 0002   UROBILINOGEN negative 07/13/2014 1059   NITRITE NEGATIVE 11/18/2023 0002   LEUKOCYTESUR NEGATIVE 11/18/2023 0002   Sepsis Labs Recent Labs  Lab 11/20/23 0451 11/21/23 0317 11/22/23 0745 11/23/23 0424  WBC 8.3 10.2 9.5 8.0   Microbiology Recent Results (from the past 240 hours)  Culture, blood (Routine X 2) w Reflex to ID Panel     Status: None (Preliminary result)   Collection Time: 11/20/23 12:24 PM  Specimen: BLOOD  Result Value Ref Range Status   Specimen Description BLOOD  BLOOD LEFT ARM  Final   Special Requests   Final    BOTTLES DRAWN AEROBIC AND ANAEROBIC Blood Culture adequate volume   Culture   Final    NO GROWTH 3 DAYS Performed at [facility], [address], [place], [place] [address]    Report Status PENDING  Incomplete  Culture, blood (Routine X 2) w Reflex to ID Panel     Status: None (Preliminary result)   Collection Time: 11/20/23 12:24 PM   Specimen: BLOOD  Result Value Ref Range Status   Specimen Description BLOOD LFOA  Final   Special Requests   Final    Blood Culture adequate volume BOTTLES DRAWN AEROBIC AND ANAEROBIC   Culture   Final    NO GROWTH 3 DAYS Performed at [facility], [address] [place], [place] [address]    Report Status PENDING  Incomplete   Time coordinating discharge:  29 mins  SIGNED:  Standley Dakins, MD  [facility] 11/23/2023, 12:53 PM How to contact the [facility] Attending or Consulting provider 7A - 7P or covering provider during after hours 7P -7A, for this patient?  Check the care team in [facility] and look for a) attending/consulting [facility] provider listed and b) the [facility] team listed Log into [email or web address] and use [place]'s universal password to access. If you do not have the password, please contact the hospital operator. Locate the [facility] provider you are looking for under [facility] and page to a number that you can be directly reached. If you still have difficulty reaching the provider, please page the [facility] (Director on Call) for the Hospitalists listed on amion for assistance.

## 2023-11-23 NOTE — Plan of Care (Signed)
  Problem: Clinical Measurements: Goal: Ability to maintain clinical measurements within normal limits will improve Outcome: Progressing Goal: Will remain free from infection Outcome: Progressing   Problem: Pain Managment: Goal: General experience of comfort will improve and/or be controlled Outcome: Progressing

## 2023-11-23 NOTE — Progress Notes (Signed)
  Subjective: Patient tolerating liquid diet well.  Has had multiple watery bowel movements.  Is passing flatus.  No abdominal pain noted.  Objective: Vital signs in last 24 hours: Temp:  [97.8 F (36.6 C)-98.5 F (36.9 C)] 97.8 F (36.6 C) (02/15 0507) Pulse Rate:  [86-102] 86 (02/15 0507) Resp:  [16-20] 16 (02/15 0507) BP: (115-125)/(77-86) 125/83 (02/15 0507) SpO2:  [94 %-96 %] 94 % (02/15 0507) Last BM Date : 11/22/23  Intake/Output from previous day: 02/14 0701 - 02/15 0700 In: 1108.5 [P.O.:720; I.V.:388.5] Out: -  Intake/Output this shift: Total I/O In: 240 [P.O.:240] Out: -   General appearance: alert, cooperative, and no distress GI: soft, non-tender; bowel sounds normal; no masses,  no organomegaly  Lab Results:  Recent Labs    11/22/23 0745 11/23/23 0424  WBC 9.5 8.0  HGB 12.0* 9.9*  HCT 41.2 32.6*  PLT 279 293   BMET Recent Labs    11/22/23 0745 11/23/23 0424  NA 141 137  K 3.3* 4.0  CL 104 104  CO2 27 25  GLUCOSE 137* 113*  BUN 14 11  CREATININE 1.31* 1.08  CALCIUM 8.0* 8.0*   PT/INR No results for input(s): "LABPROT", "INR" in the last 72 hours.  Studies/Results: DG Abd Portable 1V-Small Bowel Obstruction Protocol-initial, 8 hr delay Result Date: 11/21/2023 CLINICAL DATA:  Small-bowel obstruction, 8 hour delay EXAM: PORTABLE ABDOMEN - 1 VIEW COMPARISON:  Radiograph 11/20/2023 at 2:59 p.m. FINDINGS: Enteric tube tip and side-port in the stomach. Enteric contrast is present throughout the colon. No acute osseous abnormality. IMPRESSION: No small-bowel obstruction. Electronically Signed   By: Minerva Fester M.D.   On: 11/21/2023 21:08    Anti-infectives: Anti-infectives (From admission, onward)    None       Assessment/Plan: Impression: Small bowel obstruction, resolved Plan: Will advance to regular diet.  Should he tolerate this, he may be discharged home from the surgery standpoint.  LOS: 5 days    Franky Macho 11/23/2023

## 2023-11-25 ENCOUNTER — Other Ambulatory Visit: Payer: Self-pay

## 2023-11-25 ENCOUNTER — Telehealth: Payer: Self-pay

## 2023-11-25 ENCOUNTER — Other Ambulatory Visit (HOSPITAL_COMMUNITY): Payer: Self-pay

## 2023-11-25 ENCOUNTER — Other Ambulatory Visit: Payer: Self-pay | Admitting: Physician Assistant

## 2023-11-25 DIAGNOSIS — C3491 Malignant neoplasm of unspecified part of right bronchus or lung: Secondary | ICD-10-CM

## 2023-11-25 LAB — CULTURE, BLOOD (ROUTINE X 2)
Culture: NO GROWTH
Culture: NO GROWTH
Special Requests: ADEQUATE
Special Requests: ADEQUATE

## 2023-11-25 NOTE — Transitions of Care (Post Inpatient/ED Visit) (Signed)
11/25/2023  Name: Bradley Goodman MRN: [id number] DOB: Jun 06, 1956  Today's TOC FU Call Status: Today's TOC FU Call Status:: Successful TOC FU Call Completed TOC FU Call Complete Date: 11/25/23 Patient's Name and Date of Birth confirmed.  Transition Care Management Follow-up Telephone Call Date of Discharge: 11/23/23 Dis[facility] Type of Discharge: Inpatient Admission Primary Inpatient Discharge Diagnosis:: Small bowel obstruction How have you been since you were released from the hospital?: Better Any questions or concerns?: Yes Patient Questions/Concerns:: pt states he is having trouble keeping his glucose controlled, appt scheduled for 11/26/23 to discuss this with provider. Patient Questions/Concerns Addressed: Notified Provider of Patient Questions/Concerns  Items Reviewed: Did you receive and understand the discharge instructions provided?: Yes Medications obtained,verified, and reconciled?: Yes (Medications Reviewed) Any new allergies since your discharge?: No Dietary orders reviewed?: NA Do you have support at home?: Yes People in Home: significant other  Medications Reviewed Today: Medications Reviewed Today     Reviewed by Leigh Aurora, CMA (Certified Medical Assistant) on 11/25/23 at 1129  Med List Status: <None>   Medication Order Taking? Sig Documenting Provider Last Dose Status Informant  cholecalciferol (VITAMIN D) 1000 UNITS tablet [id number] No Take 1,000 Units by mouth every evening. [provider] 11/17/2023 Evening Active Self, Pharmacy Records, Spouse/Significant Other  Continuous Glucose Sensor (FREESTYLE LIBRE 3 SENSOR) MISC [id number] No Place 1 sensor on the skin every 14 days. Use to check glucose continuously [name], Doralee Albino, FNP Taking Active Self, Pharmacy Records, Spouse/Significant Other  dexamethasone (DECADRON) 1 MG tablet [id number] No Take 1 tablet (1 mg total) by mouth daily with breakfast. [name], FNP  11/17/2023 Morning Active Self, Pharmacy Records, Spouse/Significant Other  docusate sodium (COLACE) 100 MG capsule [id number] No Take 100 mg by mouth daily. [provider] 11/17/2023 Evening Active Self, Pharmacy Records, Spouse/Significant Other  folic acid (FOLVITE) 1 MG tablet [id number] No Take 1 tablet (1 mg total) by mouth daily. [name], FNP 11/17/2023 Evening Active Self, Pharmacy Records, Spouse/Significant Other  Krill Oil 300 MG CAPS [id number] No Take 300 mg by mouth every evening. [provider] 11/17/2023 Evening Active Self, Pharmacy Records, Spouse/Significant Other  levothyroxine (SYNTHROID) 88 MCG tablet [id number] No Take 1 tablet (88 mcg total) by mouth daily before breakfast. Roma Kayser, MD 11/17/2023 Morning Active Self, Pharmacy Records, Spouse/Significant Other  loratadine (CLARITIN) 10 MG tablet [id number] No Take 10 mg by mouth every evening.  [provider] 11/17/2023 Evening Active Self, Pharmacy Records, Spouse/Significant Other  losartan (COZAAR) 50 MG tablet [id number] No Take 1.5 tablets (75 mg total) by mouth daily. [name], FNP 11/17/2023 Evening Active Self, Pharmacy Records, Spouse/Significant Other  mirtazapine (REMERON) 15 MG tablet [id number] No Take 1 tablet (15 mg total) by mouth at bedtime. [name], FNP 11/17/2023 Bedtime Active Self, Pharmacy Records, Spouse/Significant Other  Multiple Vitamins-Minerals (MULTIVITAMIN GUMMIES ADULT PO) [id number] No Take 2 each by mouth daily. [provider] 11/17/2023 Evening Active Self, Pharmacy Records, Spouse/Significant Other  pantoprazole (PROTONIX) 40 MG tablet [id number] No Take 1 tablet (40 mg total) by mouth every evening. [name], FNP 11/17/2023 Evening Active Self, Pharmacy Records, Spouse/Significant Other  PRESCRIPTION MEDICATION [id number] No Testosterone injection [provider] Taking Active Self, Pharmacy Records, Spouse/Significant Other  rosuvastatin  (CRESTOR) 20 MG tablet [id number] No Take 1 tablet (20 mg total) by mouth daily. [name], FNP 11/17/2023 Evening Active Self, Pharmacy Records, Spouse/Significant Other  Semaglutide,0.25 or 0.5MG /DOS, (OZEMPIC,  0.25 OR 0.5 MG/DOSE,) 2 MG/3ML SOPN [id number] No Inject 0.5 mg into the skin once a week. [name], FNP 11/03/2023 Active Self, Pharmacy Records, Spouse/Significant Other  SYRINGE-NEEDLE, DISP, 3 ML (B-D 3CC LUER-LOK SYR 21GX1-1/2) 21G X 1-1/2" 3 ML MISC [id number] No Use to inject testosterone every week Roma Kayser, MD Taking Active Self, Pharmacy Records, Spouse/Significant Other  testosterone cypionate (DEPOTESTOTERONE CYPIONATE) 100 MG/ML injection [id number] No Take 50mg  ( 0.30ml) and 100mg  ( 1ml) into muscle alternatively weekly. Roma Kayser, MD 11/14/2023 Active Self, Pharmacy Records, Spouse/Significant Other  venlafaxine XR (EFFEXOR-XR) 75 MG 24 hr capsule [id number]  Take 2 capsules (150 mg total) by mouth daily with breakfast. Cleora Fleet, MD  Active             Home Care and Equipment/Supplies: Were Home Health Services Ordered?: NA Any new equipment or medical supplies ordered?: NA  Functional Questionnaire: Do you need assistance with bathing/showering or dressing?: No Do you need assistance with meal preparation?: No Do you need assistance with eating?: No Do you have difficulty maintaining continence: No Do you need assistance with getting out of bed/getting out of a chair/moving?: No Do you have difficulty managing or taking your medications?: No  Follow up appointments reviewed: PCP Follow-up appointment confirmed?: Yes Date of PCP follow-up appointment?: 11/26/23 Follow-up Provider: Gilford Silvius, FNP [facility] Follow-up appointment confirmed?: NA Do you need transportation to your follow-up appointment?: No Do you understand care options if your condition(s) worsen?: Yes-patient verbalized understanding    SIGNATURE  Agnes Lawrence, CMA (AAMA)  [facility] [phone]

## 2023-11-26 ENCOUNTER — Other Ambulatory Visit (HOSPITAL_COMMUNITY): Payer: Self-pay

## 2023-11-26 ENCOUNTER — Encounter: Payer: Self-pay | Admitting: Internal Medicine

## 2023-11-26 ENCOUNTER — Ambulatory Visit (INDEPENDENT_AMBULATORY_CARE_PROVIDER_SITE_OTHER): Payer: Commercial Managed Care - PPO | Admitting: Family Medicine

## 2023-11-26 ENCOUNTER — Encounter: Payer: Self-pay | Admitting: Family Medicine

## 2023-11-26 VITALS — BP 119/81 | HR 108 | Temp 96.9°F | Ht 71.0 in | Wt 177.8 lb

## 2023-11-26 DIAGNOSIS — R066 Hiccough: Secondary | ICD-10-CM | POA: Diagnosis not present

## 2023-11-26 DIAGNOSIS — Z7985 Long-term (current) use of injectable non-insulin antidiabetic drugs: Secondary | ICD-10-CM | POA: Diagnosis not present

## 2023-11-26 DIAGNOSIS — K56609 Unspecified intestinal obstruction, unspecified as to partial versus complete obstruction: Secondary | ICD-10-CM

## 2023-11-26 DIAGNOSIS — Z09 Encounter for follow-up examination after completed treatment for conditions other than malignant neoplasm: Secondary | ICD-10-CM | POA: Diagnosis not present

## 2023-11-26 DIAGNOSIS — E1169 Type 2 diabetes mellitus with other specified complication: Secondary | ICD-10-CM | POA: Diagnosis not present

## 2023-11-26 MED ORDER — LEVOTHYROXINE SODIUM 88 MCG PO TABS
88.0000 ug | ORAL_TABLET | Freq: Every day | ORAL | 1 refills | Status: DC
  Filled 2023-11-26: qty 90, 90d supply, fill #0
  Filled 2024-02-21: qty 90, 90d supply, fill #1

## 2023-11-26 NOTE — Progress Notes (Signed)
Subjective:  Patient ID: Bradley Goodman, male    DOB: 1956-09-11, 68 y.o.   MRN: [id number]  Patient Care Team: [name], FNP as PCP - General (Family Medicine) Jena Gauss, Gerrit Friends, MD as Consulting Physician (Gastroenterology) Delora Fuel, [place] (Optometry)   Chief Complaint:  Hospitalization Follow-up (11/17/2023 - 11/23/2023 (6 days)/[facility]- small bowel obstruction- states that he is having issues with BS running low in the high 60s to low 70s and weakness. )   HPI: Bradley Goodman is a 68 y.o. male presenting on 11/26/2023 for Hospitalization Follow-up (11/17/2023 - 11/23/2023 (6 days)/[facility]- small bowel obstruction- states that he is having issues with BS running low in the high 60s to low 70s and weakness. )   History of Present Illness   Bradley Goodman is a 68 year old male who presents with low blood sugar and weakness.  He has been experiencing hypoglycemia with blood sugar readings in the sixties and one instance in the fifties, despite discontinuing Ozempic for the past two weeks. He feels weak and is not eating well, which may contribute to the hypoglycemia. No falls have occurred.  He was recently hospitalized for a small bowel obstruction, during which he was off all medications for a week. Since returning home, he has experienced significant weight loss and has resumed eating soft foods. He reports loose, watery bowel movements, though he had a more formed stool this morning. No abdominal pain, nausea, or vomiting. He was hospitalized from Sunday to Saturday and had an NG tube during his stay, which he found uncomfortable. He was able to walk more once the tube was removed.  He experiences frequent hiccups, which have been ongoing for quite some time. Eating can aggravate the hiccups. No blood in stools.  He has not reviewed his medications since being discharged from the hospital. He last saw his thyroid doctor before his recent hospital admission,  and no changes were made to his thyroid management. His thyroid levels have been stable.          Relevant past medical, surgical, family, and social history reviewed and updated as indicated.  Allergies and medications reviewed and updated. Data reviewed: Chart in Epic.   Past Medical History:  Diagnosis Date   Cancer (HCC)    Cervical spondylolysis    Essential hypertension    GERD (gastroesophageal reflux disease)    History of kidney stones    History of migraine    Hyperlipidemia    Hypertension    PONV (postoperative nausea and vomiting)    Type 2 diabetes mellitus (HCC)     Past Surgical History:  Procedure Laterality Date   Bilateral inguinal hernia repair     BRONCHIAL NEEDLE ASPIRATION BIOPSY  04/05/2021   Procedure: BRONCHIAL NEEDLE ASPIRATION BIOPSIES;  Surgeon: Josephine Igo, DO;  [facility];  Service: Pulmonary;;   COLONOSCOPY  01/23/2012   Procedure: COLONOSCOPY;  Surgeon: Corbin Ade, MD;  Location: [facility];  Service: Endoscopy;  Laterality: N/A;  9:30 AM   COLONOSCOPY N/A 10/11/2015   Procedure: COLONOSCOPY;  Surgeon: Corbin Ade, MD;  Location: [facility];  Service: Endoscopy;  Laterality: N/A;  830   COLONOSCOPY N/A 10/14/2019   Procedure: COLONOSCOPY;  Surgeon: Corbin Ade, MD;  Location: [facility];  Service: Endoscopy;  Laterality: N/A;  1:45   POLYPECTOMY  10/14/2019   Procedure: POLYPECTOMY;  Surgeon: Corbin Ade, MD;  Location: [facility]  END[facility];  Service: Endoscopy;;  ascending colon, descending colon   VIDEO BRONCHOSCOPY WITH ENDOBRONCHIAL ULTRASOUND N/A 04/05/2021   Procedure: VIDEO BRONCHOSCOPY WITH ENDOBRONCHIAL ULTRASOUND;  Surgeon: Josephine Igo, DO;  Location: [facility];  Service: Pulmonary;  Laterality: N/A;    Social History   Socioeconomic History   Marital status: Married    Spouse name: Not on file   Number of children: Not on file   Years of education: Not on file   Highest education level:  Bachelor's degree (e.g., BA, AB, BS)  Occupational History   Not on file  Tobacco Use   Smoking status: Every Day    Current packs/day: 0.50    Average packs/day: 0.5 packs/day for 50.1 years (25.0 ttl pk-yrs)    Types: Cigarettes    Start date: 10/30/1973   Smokeless tobacco: Never  Vaping Use   Vaping status: Never Used  Substance and Sexual Activity   Alcohol use: Not Currently    Comment: One drink every 6 months.   Drug use: No   Sexual activity: Yes  Other Topics Concern   Not on file  Social History Narrative   Not on file   Social Drivers of Health   Financial Resource Strain: Patient Declined (10/14/2023)   Overall Financial Resource Strain (CARDIA)    Difficulty of Paying Living Expenses: Patient declined  Food Insecurity: No Food Insecurity (11/18/2023)   Hunger Vital Sign    Worried About Running Out of Food in the Last Year: Never true    Ran Out of Food in the Last Year: Never true  Transportation Needs: No Transportation Needs (11/18/2023)   PRAPARE - Administrator, Civil Service (Medical): No    Lack of Transportation (Non-Medical): No  Physical Activity: Unknown (10/14/2023)   Exercise Vital Sign    Days of Exercise per Week: 1 day    Minutes of Exercise per Session: Patient declined  Stress: No Stress Concern Present (10/14/2023)   [employer] of Occupational Health - Occupational Stress Questionnaire    Feeling of Stress : Not at all  Social Connections: Moderately Isolated (11/18/2023)   Social Connection and Isolation Panel [NHANES]    Frequency of Communication with Friends and Family: More than three times a week    Frequency of Social Gatherings with Friends and Family: Once a week    Attends Religious Services: Never    Database administrator or Organizations: No    Attends Banker Meetings: Never    Marital Status: Married  Catering manager Violence: Not At Risk (11/18/2023)   Humiliation, Afraid, Rape, and Kick  questionnaire    Fear of Current or Ex-Partner: No    Emotionally Abused: No    Physically Abused: No    Sexually Abused: No    Outpatient Encounter Medications as of 11/26/2023  Medication Sig   cholecalciferol (VITAMIN D) 1000 UNITS tablet Take 1,000 Units by mouth every evening.   Continuous Glucose Sensor (FREESTYLE LIBRE 3 SENSOR) MISC Place 1 sensor on the skin every 14 days. Use to check glucose continuously   dexamethasone (DECADRON) 1 MG tablet Take 1 tablet (1 mg total) by mouth daily with breakfast.   docusate sodium (COLACE) 100 MG capsule Take 100 mg by mouth daily.   folic acid (FOLVITE) 1 MG tablet Take 1 tablet (1 mg total) by mouth daily.   Krill Oil 300 MG CAPS Take 300 mg by mouth every evening.   loratadine (CLARITIN) 10 MG tablet  Take 10 mg by mouth every evening.    losartan (COZAAR) 50 MG tablet Take 1.5 tablets (75 mg total) by mouth daily.   mirtazapine (REMERON) 15 MG tablet Take 1 tablet (15 mg total) by mouth at bedtime.   Multiple Vitamins-Minerals (MULTIVITAMIN GUMMIES ADULT PO) Take 2 each by mouth daily.   pantoprazole (PROTONIX) 40 MG tablet Take 1 tablet (40 mg total) by mouth every evening.   PRESCRIPTION MEDICATION Testosterone injection   rosuvastatin (CRESTOR) 20 MG tablet Take 1 tablet (20 mg total) by mouth daily.   SYRINGE-NEEDLE, DISP, 3 ML (B-D 3CC LUER-LOK SYR 21GX1-1/2) 21G X 1-1/2" 3 ML MISC Use to inject testosterone every week   testosterone cypionate (DEPOTESTOTERONE CYPIONATE) 100 MG/ML injection Take 50mg  ( 0.67ml) and 100mg  ( 1ml) into muscle alternatively weekly.   venlafaxine XR (EFFEXOR-XR) 75 MG 24 hr capsule Take 2 capsules (150 mg total) by mouth daily with breakfast.   [DISCONTINUED] levothyroxine (SYNTHROID) 88 MCG tablet Take 1 tablet (88 mcg total) by mouth daily before breakfast.   [DISCONTINUED] Semaglutide,0.25 or 0.5MG /DOS, (OZEMPIC, 0.25 OR 0.5 MG/DOSE,) 2 MG/3ML SOPN Inject 0.5 mg into the skin once a week.   No  facility-administered encounter medications on file as of 11/26/2023.    No Known Allergies  Pertinent ROS per HPI, otherwise unremarkable      Objective:  BP 119/81   Pulse (!) 108   Temp (!) 96.9 F (36.1 C)   Ht 5\' 11"  (1.803 m)   Wt 177 lb 12.8 oz (80.6 kg)   SpO2 96%   BMI 24.80 kg/m    Wt Readings from Last 3 Encounters:  11/26/23 177 lb 12.8 oz (80.6 kg)  11/17/23 182 lb 15.7 oz (83 kg)  11/07/23 182 lb (82.6 kg)    Physical Exam Vitals and nursing note reviewed.  Constitutional:      General: He is not in acute distress.    Appearance: He is ill-appearing (chronically). He is not toxic-appearing or diaphoretic.  HENT:     Head: Normocephalic.     Mouth/Throat:     Mouth: Mucous membranes are moist.     Pharynx: Oropharynx is clear.  Eyes:     Conjunctiva/sclera: Conjunctivae normal.     Pupils: Pupils are equal, round, and reactive to light.  Cardiovascular:     Rate and Rhythm: Normal rate and regular rhythm.     Heart sounds: Normal heart sounds.  Pulmonary:     Effort: Pulmonary effort is normal.     Breath sounds: Normal breath sounds.  Abdominal:     General: There is distension (minimal).     Tenderness: There is no abdominal tenderness.  Musculoskeletal:     Right lower leg: No edema.     Left lower leg: No edema.  Skin:    General: Skin is warm and dry.     Capillary Refill: Capillary refill takes less than 2 seconds.  Neurological:     General: No focal deficit present.     Mental Status: He is alert and oriented to person, place, and time.  Psychiatric:        Mood and Affect: Mood normal.        Behavior: Behavior normal.        Thought Content: Thought content normal.        Judgment: Judgment normal.     Results for orders placed or performed during the hospital encounter of 11/17/23  Urinalysis, Routine w reflex microscopic -Urine, Clean Catch  Collection Time: 11/18/23 12:02 AM  Result Value Ref Range   Color, Urine YELLOW  YELLOW   APPearance CLEAR CLEAR   Specific Gravity, Urine 1.005 1.005 - 1.030   pH 5.0 5.0 - 8.0   Glucose, UA NEGATIVE NEGATIVE mg/dL   Hgb urine dipstick NEGATIVE NEGATIVE   Bilirubin Urine NEGATIVE NEGATIVE   Ketones, ur NEGATIVE NEGATIVE mg/dL   Protein, ur NEGATIVE NEGATIVE mg/dL   Nitrite NEGATIVE NEGATIVE   Leukocytes,Ua NEGATIVE NEGATIVE  Lipase, blood   Collection Time: 11/18/23 12:14 AM  Result Value Ref Range   Lipase 31 11 - 51 U/L  Comprehensive metabolic panel   Collection Time: 11/18/23 12:14 AM  Result Value Ref Range   Sodium 139 135 - 145 mmol/L   Potassium 3.8 3.5 - 5.1 mmol/L   Chloride 103 98 - 111 mmol/L   CO2 25 22 - 32 mmol/L   Glucose, Bld 113 (H) 70 - 99 mg/dL   BUN 20 8 - 23 mg/dL   Creatinine, Ser 0.98 (H) 0.61 - 1.24 mg/dL   Calcium 11.9 8.9 - 14.7 mg/dL   Total Protein 7.3 6.5 - 8.1 g/dL   Albumin 3.7 3.5 - 5.0 g/dL   AST 20 15 - 41 U/L   ALT 18 0 - 44 U/L   Alkaline Phosphatase 56 38 - 126 U/L   Total Bilirubin 0.6 0.0 - 1.2 mg/dL   GFR, Estimated >82 >95 mL/min   Anion gap 11 5 - 15  CBC   Collection Time: 11/18/23 12:14 AM  Result Value Ref Range   WBC 18.5 (H) 4.0 - 10.5 K/uL   RBC 5.55 4.22 - 5.81 MIL/uL   Hemoglobin 14.1 13.0 - 17.0 g/dL   HCT 62.1 30.8 - 65.7 %   MCV 86.7 80.0 - 100.0 fL   MCH 25.4 (L) 26.0 - 34.0 pg   MCHC 29.3 (L) 30.0 - 36.0 g/dL   RDW 84.6 (H) 96.2 - 95.2 %   Platelets 404 (H) 150 - 400 K/uL   nRBC 0.0 0.0 - 0.2 %  HIV Antibody (routine testing w rflx)   Collection Time: 11/18/23  9:35 AM  Result Value Ref Range   HIV Screen 4th Generation wRfx Non Reactive Non Reactive  Hemoglobin A1c   Collection Time: 11/18/23  9:35 AM  Result Value Ref Range   Hgb A1c MFr Bld 5.8 (H) 4.8 - 5.6 %   Mean Plasma Glucose 119.76 mg/dL  Comprehensive metabolic panel   Collection Time: 11/19/23  3:50 AM  Result Value Ref Range   Sodium 138 135 - 145 mmol/L   Potassium 3.4 (L) 3.5 - 5.1 mmol/L   Chloride 108 98 - 111  mmol/L   CO2 22 22 - 32 mmol/L   Glucose, Bld 70 70 - 99 mg/dL   BUN 17 8 - 23 mg/dL   Creatinine, Ser 8.41 0.61 - 1.24 mg/dL   Calcium 8.0 (L) 8.9 - 10.3 mg/dL   Total Protein 5.5 (L) 6.5 - 8.1 g/dL   Albumin 2.8 (L) 3.5 - 5.0 g/dL   AST 14 (L) 15 - 41 U/L   ALT 13 0 - 44 U/L   Alkaline Phosphatase 48 38 - 126 U/L   Total Bilirubin 0.5 0.0 - 1.2 mg/dL   GFR, Estimated >32 >44 mL/min   Anion gap 8 5 - 15  CBC   Collection Time: 11/19/23  3:50 AM  Result Value Ref Range   WBC 10.2 4.0 - 10.5 K/uL  RBC 4.50 4.22 - 5.81 MIL/uL   Hemoglobin 11.3 (L) 13.0 - 17.0 g/dL   HCT 16.1 (L) 09.6 - 04.5 %   MCV 85.8 80.0 - 100.0 fL   MCH 25.1 (L) 26.0 - 34.0 pg   MCHC 29.3 (L) 30.0 - 36.0 g/dL   RDW 40.9 (H) 81.1 - 91.4 %   Platelets 300 150 - 400 K/uL   nRBC 0.0 0.0 - 0.2 %  Magnesium   Collection Time: 11/19/23  3:50 AM  Result Value Ref Range   Magnesium 1.6 (L) 1.7 - 2.4 mg/dL  Phosphorus   Collection Time: 11/19/23  3:50 AM  Result Value Ref Range   Phosphorus 2.3 (L) 2.5 - 4.6 mg/dL  Basic metabolic panel   Collection Time: 11/20/23  4:51 AM  Result Value Ref Range   Sodium 139 135 - 145 mmol/L   Potassium 3.2 (L) 3.5 - 5.1 mmol/L   Chloride 107 98 - 111 mmol/L   CO2 24 22 - 32 mmol/L   Glucose, Bld 84 70 - 99 mg/dL   BUN 13 8 - 23 mg/dL   Creatinine, Ser 7.82 0.61 - 1.24 mg/dL   Calcium 7.9 (L) 8.9 - 10.3 mg/dL   GFR, Estimated >95 >62 mL/min   Anion gap 8 5 - 15  CBC   Collection Time: 11/20/23  4:51 AM  Result Value Ref Range   WBC 8.3 4.0 - 10.5 K/uL   RBC 4.32 4.22 - 5.81 MIL/uL   Hemoglobin 11.3 (L) 13.0 - 17.0 g/dL   HCT 13.0 (L) 86.5 - 78.4 %   MCV 85.2 80.0 - 100.0 fL   MCH 26.2 26.0 - 34.0 pg   MCHC 30.7 30.0 - 36.0 g/dL   RDW 69.6 (H) 29.5 - 28.4 %   Platelets 291 150 - 400 K/uL   nRBC 0.0 0.0 - 0.2 %  Troponin I (High Sensitivity)   Collection Time: 11/20/23  4:51 AM  Result Value Ref Range   Troponin I (High Sensitivity) 6 <18 ng/L  Culture, blood  (Routine X 2) w Reflex to ID Panel   Collection Time: 11/20/23 12:24 PM   Specimen: BLOOD  Result Value Ref Range   Specimen Description BLOOD BLOOD LEFT ARM    Special Requests      BOTTLES DRAWN AEROBIC AND ANAEROBIC Blood Culture adequate volume   Culture      NO GROWTH 5 DAYS Performed at [facility], [address], [place], [place] [address]    Report Status 11/25/2023 FINAL   Culture, blood (Routine X 2) w Reflex to ID Panel   Collection Time: 11/20/23 12:24 PM   Specimen: BLOOD  Result Value Ref Range   Specimen Description BLOOD LFOA    Special Requests      Blood Culture adequate volume BOTTLES DRAWN AEROBIC AND ANAEROBIC   Culture      NO GROWTH 5 DAYS Performed at [facility], [address] [place], [place] [address]    Report Status 11/25/2023 FINAL   Hemoglobin and hematocrit, blood   Collection Time: 11/20/23  9:17 PM  Result Value Ref Range   Hemoglobin 12.1 (L) 13.0 - 17.0 g/dL   HCT 25.3 66.4 - 40.3 %  CBC   Collection Time: 11/21/23  3:17 AM  Result Value Ref Range   WBC 10.2 4.0 - 10.5 K/uL   RBC 4.83 4.22 - 5.81 MIL/uL   Hemoglobin 12.3 (L) 13.0 - 17.0 g/dL   HCT 47.4 25.9 - 56.3 %  MCV 86.1 80.0 - 100.0 fL   MCH 25.5 (L) 26.0 - 34.0 pg   MCHC 29.6 (L) 30.0 - 36.0 g/dL   RDW 09.8 (H) 11.9 - 14.7 %   Platelets 323 150 - 400 K/uL   nRBC 0.0 0.0 - 0.2 %  Basic metabolic panel   Collection Time: 11/21/23  3:17 AM  Result Value Ref Range   Sodium 142 135 - 145 mmol/L   Potassium 3.5 3.5 - 5.1 mmol/L   Chloride 106 98 - 111 mmol/L   CO2 25 22 - 32 mmol/L   Glucose, Bld 98 70 - 99 mg/dL   BUN 11 8 - 23 mg/dL   Creatinine, Ser 8.29 0.61 - 1.24 mg/dL   Calcium 8.3 (L) 8.9 - 10.3 mg/dL   GFR, Estimated >56 >21 mL/min   Anion gap 11 5 - 15  Glucose, capillary   Collection Time: 11/21/23  8:22 PM  Result Value Ref Range   Glucose-Capillary 104 (H) 70 - 99 mg/dL  Glucose, capillary   Collection Time: 11/22/23 12:47 AM  Result Value Ref Range    Glucose-Capillary 114 (H) 70 - 99 mg/dL  Glucose, capillary   Collection Time: 11/22/23  5:04 AM  Result Value Ref Range   Glucose-Capillary 116 (H) 70 - 99 mg/dL  CBC   Collection Time: 11/22/23  7:45 AM  Result Value Ref Range   WBC 9.5 4.0 - 10.5 K/uL   RBC 4.75 4.22 - 5.81 MIL/uL   Hemoglobin 12.0 (L) 13.0 - 17.0 g/dL   HCT 30.8 65.7 - 84.6 %   MCV 86.7 80.0 - 100.0 fL   MCH 25.3 (L) 26.0 - 34.0 pg   MCHC 29.1 (L) 30.0 - 36.0 g/dL   RDW 96.2 (H) 95.2 - 84.1 %   Platelets 279 150 - 400 K/uL   nRBC 0.0 0.0 - 0.2 %  Basic metabolic panel   Collection Time: 11/22/23  7:45 AM  Result Value Ref Range   Sodium 141 135 - 145 mmol/L   Potassium 3.3 (L) 3.5 - 5.1 mmol/L   Chloride 104 98 - 111 mmol/L   CO2 27 22 - 32 mmol/L   Glucose, Bld 137 (H) 70 - 99 mg/dL   BUN 14 8 - 23 mg/dL   Creatinine, Ser 3.24 (H) 0.61 - 1.24 mg/dL   Calcium 8.0 (L) 8.9 - 10.3 mg/dL   GFR, Estimated >40 >10 mL/min   Anion gap 10 5 - 15  Magnesium   Collection Time: 11/22/23  7:45 AM  Result Value Ref Range   Magnesium 1.9 1.7 - 2.4 mg/dL  CBC   Collection Time: 11/23/23  4:24 AM  Result Value Ref Range   WBC 8.0 4.0 - 10.5 K/uL   RBC 3.79 (L) 4.22 - 5.81 MIL/uL   Hemoglobin 9.9 (L) 13.0 - 17.0 g/dL   HCT 27.2 (L) 53.6 - 64.4 %   MCV 86.0 80.0 - 100.0 fL   MCH 26.1 26.0 - 34.0 pg   MCHC 30.4 30.0 - 36.0 g/dL   RDW 03.4 (H) 74.2 - 59.5 %   Platelets 293 150 - 400 K/uL   nRBC 0.0 0.0 - 0.2 %  Basic metabolic panel   Collection Time: 11/23/23  4:24 AM  Result Value Ref Range   Sodium 137 135 - 145 mmol/L   Potassium 4.0 3.5 - 5.1 mmol/L   Chloride 104 98 - 111 mmol/L   CO2 25 22 - 32 mmol/L   Glucose, Bld 113 (  H) 70 - 99 mg/dL   BUN 11 8 - 23 mg/dL   Creatinine, Ser 1.61 0.61 - 1.24 mg/dL   Calcium 8.0 (L) 8.9 - 10.3 mg/dL   GFR, Estimated >09 >60 mL/min   Anion gap 8 5 - 15  Magnesium   Collection Time: 11/23/23  4:24 AM  Result Value Ref Range   Magnesium 2.3 1.7 - 2.4 mg/dL        Pertinent labs & imaging results that were available during my care of the patient were reviewed by me and considered in my medical decision making.  Assessment & Plan:  Fuller was seen today for hospitalization follow-up.  Diagnoses and all orders for this visit:  Small bowel obstruction (HCC) -     CMP14+EGFR -     CBC with Differential/Platelet -     Phosphorus  Hospital discharge follow-up -     CMP14+EGFR -     CBC with Differential/Platelet -     Phosphorus  Hiccups -     CMP14+EGFR -     CBC with Differential/Platelet -     Phosphorus  Type 2 diabetes mellitus with other specified complication, without long-term current use of insulin (HCC) -     CMP14+EGFR -     CBC with Differential/Platelet -     Phosphorus   Today's visit was for Transitional Care Management.  The patient [facility] with a primary diagnosis of small bowel obstruction.   Contact with the patient and/or caregiver, by a clinical staff member, was made on 11/25/2023 and was documented as a telephone encounter within the EMR.  Through chart review and discussion with the patient I have determined that management of their condition is of high complexity.       Assessment and Plan    Small Bowel Obstruction Recent hospitalization for small bowel obstruction. Currently tolerating soft foods with loose, watery stools. No abdominal pain, nausea, or vomiting. Weight loss and weakness reported. - Monitor bowel movements - Encourage small, frequent meals  Hypoglycemia Blood sugars in the 60s-70s, one instance in the 50s. Weakness reported. Ozempic discontinued for two weeks. Advised not to restart Ozempic unless blood sugars consistently above 120-130. Emphasized importance of small, frequent meals and snacks to maintain blood sugar levels. - Do not restart Ozempic - Monitor blood sugars - Encourage small, frequent meals and snacks - Repeat blood count and check  blood sugar levels - Check calcium and potassium levels  Hiccups Persistent hiccups. Possible causes include low phosphorus levels or Ozempic side effects. Thorazine considered for treatment. Educated on potential side effects of Thorazine (drowsiness, unsteady gait, sedation). - Check phosphorus level - If normal, prescribe Thorazine 25 mg up to 3-4 times daily - Educate on potential side effects of Thorazine  Chemotherapy Follow-up Pending chemotherapy schedule. Recent scan done two weeks ago, awaiting review. MRI scheduled for May 8. Blood count to be repeated to monitor overall health status. - Repeat blood count - Follow up on chemotherapy schedule - Review recent scan results - MRI on May 8  Follow-up - Follow up as scheduled - Contact if blood sugars consistently above 120-130.          Continue all other maintenance medications.  Follow up plan: Return if symptoms worsen or fail to improve.   Continue healthy lifestyle choices, including diet (rich in fruits, vegetables, and lean proteins, and low in salt and simple carbohydrates) and exercise (at least 30 minutes of moderate  physical activity daily).   The above assessment and management plan was discussed with the patient. The patient verbalized understanding of and has agreed to the management plan. Patient is aware to call the clinic if they develop any new symptoms or if symptoms persist or worsen. Patient is aware when to return to the clinic for a follow-up visit. Patient educated on when it is appropriate to go to the emergency department.   Kari Baars, FNP-C [facility] [phone]

## 2023-11-27 ENCOUNTER — Encounter: Payer: Self-pay | Admitting: Internal Medicine

## 2023-11-27 ENCOUNTER — Ambulatory Visit: Payer: Self-pay | Admitting: Family Medicine

## 2023-11-27 ENCOUNTER — Encounter: Payer: Self-pay | Admitting: Family Medicine

## 2023-11-27 ENCOUNTER — Other Ambulatory Visit (HOSPITAL_BASED_OUTPATIENT_CLINIC_OR_DEPARTMENT_OTHER): Payer: Self-pay

## 2023-11-27 ENCOUNTER — Other Ambulatory Visit (HOSPITAL_COMMUNITY): Payer: Self-pay

## 2023-11-27 ENCOUNTER — Other Ambulatory Visit: Payer: Self-pay

## 2023-11-27 LAB — CBC WITH DIFFERENTIAL/PLATELET
Basophils Absolute: 0.1 10*3/uL (ref 0.0–0.2)
Basos: 1 %
EOS (ABSOLUTE): 0.2 10*3/uL (ref 0.0–0.4)
Eos: 1 %
Hematocrit: 40.4 % (ref 37.5–51.0)
Hemoglobin: 12 g/dL — ABNORMAL LOW (ref 13.0–17.7)
Immature Grans (Abs): 0.2 10*3/uL — ABNORMAL HIGH (ref 0.0–0.1)
Immature Granulocytes: 1 %
Lymphocytes Absolute: 2.1 10*3/uL (ref 0.7–3.1)
Lymphs: 15 %
MCH: 25.6 pg — ABNORMAL LOW (ref 26.6–33.0)
MCHC: 29.7 g/dL — ABNORMAL LOW (ref 31.5–35.7)
MCV: 86 fL (ref 79–97)
Monocytes Absolute: 0.7 10*3/uL (ref 0.1–0.9)
Monocytes: 5 %
Neutrophils Absolute: 10.9 10*3/uL — ABNORMAL HIGH (ref 1.4–7.0)
Neutrophils: 77 %
Platelets: 321 10*3/uL (ref 150–450)
RBC: 4.69 x10E6/uL (ref 4.14–5.80)
RDW: 18.7 % — ABNORMAL HIGH (ref 11.6–15.4)
WBC: 14.1 10*3/uL — ABNORMAL HIGH (ref 3.4–10.8)

## 2023-11-27 LAB — CMP14+EGFR
ALT: 12 IU/L (ref 0–44)
AST: 19 IU/L (ref 0–40)
Albumin: 3.6 g/dL — ABNORMAL LOW (ref 3.9–4.9)
Alkaline Phosphatase: 73 IU/L (ref 44–121)
BUN/Creatinine Ratio: 11 (ref 10–24)
BUN: 11 mg/dL (ref 8–27)
CO2: 23 mmol/L (ref 20–29)
Calcium: 8.7 mg/dL (ref 8.6–10.2)
Chloride: 105 mmol/L (ref 96–106)
Creatinine, Ser: 0.99 mg/dL (ref 0.76–1.27)
Globulin, Total: 2.4 g/dL (ref 1.5–4.5)
Glucose: 140 mg/dL — ABNORMAL HIGH (ref 70–99)
Potassium: 4.1 mmol/L (ref 3.5–5.2)
Sodium: 144 mmol/L (ref 134–144)
Total Protein: 6 g/dL (ref 6.0–8.5)
eGFR: 84 mL/min/{1.73_m2} (ref >59)

## 2023-11-27 LAB — PHOSPHORUS: Phosphorus: 3.1 mg/dL (ref 2.8–4.1)

## 2023-11-27 MED ORDER — CHLORPROMAZINE HCL 10 MG PO TABS
10.0000 mg | ORAL_TABLET | Freq: Three times a day (TID) | ORAL | 0 refills | Status: AC | PRN
Start: 2023-11-27 — End: ?
  Filled 2023-11-27: qty 90, 30d supply, fill #0

## 2023-11-27 NOTE — Telephone Encounter (Signed)
  Chief Complaint: Calling back for lab results  Disposition: [] ED /[] Urgent Care (no appt availability in office) / [] Appointment(In office/virtual)/ []  [facility]/ [x] Home Care/ [] Refused Recommended Disposition /[] [facility]/ []  Follow-up with PCP Additional Notes: Patient returning call regarding lab results. PCP notes relayed to patient, patient verbalized understanding and denies further questions. Alerting PCP for review.   Reason for Disposition  Health Information question, no triage required and triager able to answer question  Answer Assessment - Initial Assessment Questions 1. REASON FOR CALL or QUESTION: "What is your reason for calling today?" or "How can I best help you?" or "What question do you have that I can help answer?"     Patient returning call regarding lab results.  Protocols used: Information Only Call - No Triage-A-AH

## 2023-11-27 NOTE — Addendum Note (Signed)
Addended by: [name] on: 11/27/2023 08:18 AM   Modules accepted: Orders

## 2023-11-28 ENCOUNTER — Other Ambulatory Visit (HOSPITAL_COMMUNITY): Payer: Self-pay

## 2023-11-28 ENCOUNTER — Other Ambulatory Visit: Payer: Self-pay

## 2023-11-29 ENCOUNTER — Other Ambulatory Visit: Payer: Self-pay

## 2023-11-29 ENCOUNTER — Other Ambulatory Visit (HOSPITAL_COMMUNITY): Payer: Self-pay

## 2023-11-30 NOTE — Progress Notes (Signed)
 Updated pemetrexed ERX to [id number]   Pryor Ochoa, PharmD 11/30/23

## 2023-12-04 ENCOUNTER — Inpatient Hospital Stay: Payer: Commercial Managed Care - PPO

## 2023-12-04 ENCOUNTER — Inpatient Hospital Stay: Payer: Commercial Managed Care - PPO | Attending: Physician Assistant

## 2023-12-04 ENCOUNTER — Inpatient Hospital Stay (HOSPITAL_BASED_OUTPATIENT_CLINIC_OR_DEPARTMENT_OTHER): Payer: Commercial Managed Care - PPO | Admitting: Internal Medicine

## 2023-12-04 VITALS — BP 143/93 | HR 92 | Temp 97.8°F | Resp 17 | Ht 71.0 in | Wt 181.8 lb

## 2023-12-04 DIAGNOSIS — C7931 Secondary malignant neoplasm of brain: Secondary | ICD-10-CM | POA: Insufficient documentation

## 2023-12-04 DIAGNOSIS — Z5112 Encounter for antineoplastic immunotherapy: Secondary | ICD-10-CM | POA: Insufficient documentation

## 2023-12-04 DIAGNOSIS — Z79899 Other long term (current) drug therapy: Secondary | ICD-10-CM | POA: Diagnosis not present

## 2023-12-04 DIAGNOSIS — C3491 Malignant neoplasm of unspecified part of right bronchus or lung: Secondary | ICD-10-CM

## 2023-12-04 DIAGNOSIS — C3411 Malignant neoplasm of upper lobe, right bronchus or lung: Secondary | ICD-10-CM | POA: Insufficient documentation

## 2023-12-04 DIAGNOSIS — C7951 Secondary malignant neoplasm of bone: Secondary | ICD-10-CM | POA: Insufficient documentation

## 2023-12-04 DIAGNOSIS — K56609 Unspecified intestinal obstruction, unspecified as to partial versus complete obstruction: Secondary | ICD-10-CM | POA: Insufficient documentation

## 2023-12-04 DIAGNOSIS — Z5111 Encounter for antineoplastic chemotherapy: Secondary | ICD-10-CM | POA: Insufficient documentation

## 2023-12-04 LAB — CBC WITH DIFFERENTIAL (CANCER CENTER ONLY)
Abs Immature Granulocytes: 0.21 10*3/uL — ABNORMAL HIGH (ref 0.00–0.07)
Basophils Absolute: 0.1 10*3/uL (ref 0.0–0.1)
Basophils Relative: 1 %
Eosinophils Absolute: 0.1 10*3/uL (ref 0.0–0.5)
Eosinophils Relative: 1 %
HCT: 39 % (ref 39.0–52.0)
Hemoglobin: 11.5 g/dL — ABNORMAL LOW (ref 13.0–17.0)
Immature Granulocytes: 2 %
Lymphocytes Relative: 14 %
Lymphs Abs: 1.8 10*3/uL (ref 0.7–4.0)
MCH: 25.7 pg — ABNORMAL LOW (ref 26.0–34.0)
MCHC: 29.5 g/dL — ABNORMAL LOW (ref 30.0–36.0)
MCV: 87.2 fL (ref 80.0–100.0)
Monocytes Absolute: 0.6 10*3/uL (ref 0.1–1.0)
Monocytes Relative: 5 %
Neutro Abs: 9.8 10*3/uL — ABNORMAL HIGH (ref 1.7–7.7)
Neutrophils Relative %: 77 %
Platelet Count: 276 10*3/uL (ref 150–400)
RBC: 4.47 MIL/uL (ref 4.22–5.81)
RDW: 19.2 % — ABNORMAL HIGH (ref 11.5–15.5)
WBC Count: 12.6 10*3/uL — ABNORMAL HIGH (ref 4.0–10.5)
nRBC: 0.2 % (ref 0.0–0.2)

## 2023-12-04 LAB — CMP (CANCER CENTER ONLY)
ALT: 16 U/L (ref 0–44)
AST: 18 U/L (ref 15–41)
Albumin: 3.7 g/dL (ref 3.5–5.0)
Alkaline Phosphatase: 59 U/L (ref 38–126)
Anion gap: 7 (ref 5–15)
BUN: 14 mg/dL (ref 8–23)
CO2: 27 mmol/L (ref 22–32)
Calcium: 8.9 mg/dL (ref 8.9–10.3)
Chloride: 107 mmol/L (ref 98–111)
Creatinine: 0.97 mg/dL (ref 0.61–1.24)
GFR, Estimated: >60 mL/min (ref >60)
Glucose, Bld: 91 mg/dL (ref 70–99)
Potassium: 3.8 mmol/L (ref 3.5–5.1)
Sodium: 141 mmol/L (ref 135–145)
Total Bilirubin: 0.2 mg/dL (ref 0.0–1.2)
Total Protein: 6.5 g/dL (ref 6.5–8.1)

## 2023-12-04 MED ORDER — SODIUM CHLORIDE 0.9 % IV SOLN
500.0000 mg/m2 | Freq: Once | INTRAVENOUS | Status: AC
  Administered 2023-12-04: 1000 mg via INTRAVENOUS
  Filled 2023-12-04: qty 40

## 2023-12-04 MED ORDER — HEPARIN SOD (PORK) LOCK FLUSH 100 UNIT/ML IV SOLN: 500.0000 [IU] | Freq: Once | INTRAVENOUS | Status: DC | PRN

## 2023-12-04 MED ORDER — SODIUM CHLORIDE 0.9 % IV SOLN: Freq: Once | INTRAVENOUS | Status: AC

## 2023-12-04 MED ORDER — SODIUM CHLORIDE 0.9% FLUSH
10.0000 mL | INTRAVENOUS | Status: DC | PRN
Start: 2023-12-04 — End: 2023-12-04

## 2023-12-04 MED ORDER — SODIUM CHLORIDE 0.9 % IV SOLN
200.0000 mg | Freq: Once | INTRAVENOUS | Status: AC
  Administered 2023-12-04: 200 mg via INTRAVENOUS
  Filled 2023-12-04: qty 200

## 2023-12-04 MED ORDER — PROCHLORPERAZINE MALEATE 10 MG PO TABS
10.0000 mg | ORAL_TABLET | Freq: Once | ORAL | Status: AC
  Administered 2023-12-04: 10 mg via ORAL
  Filled 2023-12-04: qty 1

## 2023-12-04 NOTE — Patient Instructions (Signed)
 [facility] - A DEPT OF [facility]  Discharge Instructions: Thank you for choosing [facility] to provide your oncology and hematology care.   If you have a lab appointment with the Cancer Center, please go directly to the Cancer Center and check in at the registration area.   Wear comfortable clothing and clothing appropriate for easy access to any Portacath or PICC line.   We strive to give you quality time with your provider. You may need to reschedule your appointment if you arrive late (15 or more minutes).  Arriving late affects you and other patients whose appointments are after yours.  Also, if you miss three or more appointments without notifying the office, you may be dismissed from the clinic at the provider's discretion.      For prescription refill requests, have your pharmacy contact our office and allow 72 hours for refills to be completed.    Today you received the following chemotherapy and/or immunotherapy agents: Keytruda, Alimta.       To help prevent nausea and vomiting after your treatment, we encourage you to take your nausea medication as directed.  BELOW ARE SYMPTOMS THAT SHOULD BE REPORTED IMMEDIATELY: *FEVER GREATER THAN 100.4 F (38 C) OR HIGHER *CHILLS OR SWEATING *NAUSEA AND VOMITING THAT IS NOT CONTROLLED WITH YOUR NAUSEA MEDICATION *UNUSUAL SHORTNESS OF BREATH *UNUSUAL BRUISING OR BLEEDING *URINARY PROBLEMS (pain or burning when urinating, or frequent urination) *BOWEL PROBLEMS (unusual diarrhea, constipation, pain near the anus) TENDERNESS IN MOUTH AND THROAT WITH OR WITHOUT PRESENCE OF ULCERS (sore throat, sores in mouth, or a toothache) UNUSUAL RASH, SWELLING OR PAIN  UNUSUAL VAGINAL DISCHARGE OR ITCHING   Items with * indicate a potential emergency and should be followed up as soon as possible or go to the Emergency Department if any problems should occur.  Please show the CHEMOTHERAPY ALERT CARD or  IMMUNOTHERAPY ALERT CARD at check-in to the Emergency Department and triage nurse.  Should you have questions after your visit or need to cancel or reschedule your appointment, please contact [facility] - A DE[facility]  Dept: [phone]  and follow the prompts.  Office hours are 8:00 a.m. to 4:30 p.m. Monday - Friday. Please note that voicemails left after 4:00 p.m. may not be returned until the following business day.  We are closed weekends and major holidays. You have access to a nurse at all times for urgent questions. Please call the main number to the clinic Dept: [phone] and follow the prompts.   For any non-urgent questions, you may also contact your provider using MyChart. We now offer e-Visits for anyone 61 and older to request care online for non-urgent symptoms. For details visit [email or web address].   Also download the MyChart app! Go to the app store, search "MyChart", open the app, select , and log in with your MyChart username and password.

## 2023-12-04 NOTE — Progress Notes (Signed)
 [facility] [phone]   [phone]  OFFICE PROGRESS NOTE  [name], FNP [address] [place] [address]  DIAGNOSIS: Stage IV (T1b, N3, M1C) non-small cell lung cancer, favoring adenocarcinoma presented with right upper lobe lung nodule in addition to right hilar, subcarinal and bilateral mediastinal as well as supraclavicular lymphadenopathy in addition to bone and brain metastasis diagnosed in June 2022.     PD-L1 expression 80%.     Molecular Studies:  Biomarker Findings Microsatellite status - MS-Stable Tumor Mutational Burden - 6 Muts/Mb Genomic Findings For a complete list of the genes assayed, please refer to the Appendix. KRAS G12C, amplification ATM S470* CCND1 amplification - equivocal? HGF amplification - equivocal? MYC amplification - equivocal? FGF19 amplification - equivocal? FGF3 amplification - equivocal? FGF4 amplification - equivocal? NFKBIA amplification NKX2-1 amplification RAD21 amplification - equivocal? [id number]*26 TERT promoter -124C>T TP53 rearrangement exon 9 7 Disease relevant genes with no reportable alterations: ALK, BRAF, EGFR, ERBB2, MET, RET, ROS1   PRIOR THERAPY: SRS to the metastatic brain lesions under the care of Dr. Kathrynn Running.  Last treatment on 05/04/2021.   CURRENT THERAPY: Palliative systemic chemotherapy with carboplatin for an AUC 5, Alimta 500 mg/m2 and, Keytruda 200 mg IV every 3 weeks.  First dose expected on 05/08/2021.  Status post 43 cycles.  Starting from cycle #5 he is on maintenance treatment with Alimta and Keytruda every 3 weeks.  INTERVAL HISTORY: Chanel Mckesson Poinsett 68 y.o. male returns to the clinic today for follow-up visit accompanied by his wife. Discussed the use of AI scribe software for clinical note transcription with the patient, who gave verbal consent to proceed.  History of Present Illness   INGVALD THEISEN is a 68 year old male with adenocarcinoma who  presents for cycle number forty-four of Alimta and Keytruda treatment. He is accompanied by his wife.  He has a history of adenocarcinoma with a KRAS G12C mutation and an 80% PD-L1 expression. Initially, he underwent chemotherapy with carboplatin, Alimta, and Keytruda starting in June 2022. He has been receiving Alimta and Keytruda every three weeks and is currently on cycle number forty-four.  Recently, his treatment was delayed by two weeks due to a small bowel obstruction, for which he was hospitalized. He experienced symptoms of bloating, abdominal distention, and minor discomfort, along with diarrhea, but no nausea, vomiting, fever, or chills. The obstruction was managed conservatively with an NG tube. He lost ten pounds during the hospitalization but has since regained five pounds.  No current issues or complaints, including no nausea, vomiting, diarrhea, chest pain, or breathing issues. His recent lab work showed a slightly elevated white blood cell count following the hospitalization.       MEDICAL HISTORY: Past Medical History:  Diagnosis Date   Cancer [facility])    Cervical spondylolysis    Essential hypertension    GERD (gastroesophageal reflux disease)    History of kidney stones    History of migraine    Hyperlipidemia    Hypertension    PONV (postoperative nausea and vomiting)    Type 2 diabetes mellitus (HCC)     ALLERGIES:  has no known allergies.  MEDICATIONS:  Current Outpatient Medications  Medication Sig Dispense Refill   chlorproMAZINE (THORAZINE) 10 MG tablet Take 1 tablet (10 mg total) by mouth 3 (three) times daily as needed for hiccoughs. 90 tablet 0   cholecalciferol (VITAMIN D) 1000 UNITS tablet Take 1,000 Units by mouth every evening.  Continuous Glucose Sensor (FREESTYLE LIBRE 3 SENSOR) MISC Place 1 sensor on the skin every 14 days. Use to check glucose continuously 6 each 3   dexamethasone (DECADRON) 1 MG tablet Take 1 tablet (1 mg total) by mouth daily  with breakfast. 90 tablet 1   docusate sodium (COLACE) 100 MG capsule Take 100 mg by mouth daily.     folic acid (FOLVITE) 1 MG tablet Take 1 tablet (1 mg total) by mouth daily. 90 tablet 0   Krill Oil 300 MG CAPS Take 300 mg by mouth every evening.     levothyroxine (SYNTHROID) 88 MCG tablet Take 1 tablet (88 mcg total) by mouth daily before breakfast. 90 tablet 1   loratadine (CLARITIN) 10 MG tablet Take 10 mg by mouth every evening.      losartan (COZAAR) 50 MG tablet Take 1.5 tablets (75 mg total) by mouth daily. 135 tablet 1   mirtazapine (REMERON) 15 MG tablet Take 1 tablet (15 mg total) by mouth at bedtime. 90 tablet 0   Multiple Vitamins-Minerals (MULTIVITAMIN GUMMIES ADULT PO) Take 2 each by mouth daily.     pantoprazole (PROTONIX) 40 MG tablet Take 1 tablet (40 mg total) by mouth every evening. 90 tablet 1   PRESCRIPTION MEDICATION Testosterone injection     rosuvastatin (CRESTOR) 20 MG tablet Take 1 tablet (20 mg total) by mouth daily. 90 tablet 1   SYRINGE-NEEDLE, DISP, 3 ML (B-D 3CC LUER-LOK SYR 21GX1-1/2) 21G X 1-1/2" 3 ML MISC Use to inject testosterone every week 100 each 2   testosterone cypionate (DEPOTESTOTERONE CYPIONATE) 100 MG/ML injection Take 50mg  ( 0.42ml) and 100mg  ( 1ml) into muscle alternatively weekly. 10 mL 1   venlafaxine XR (EFFEXOR-XR) 75 MG 24 hr capsule Take 2 capsules (150 mg total) by mouth daily with breakfast.     No current facility-administered medications for this visit.    SURGICAL HISTORY:  Past Surgical History:  Procedure Laterality Date   Bilateral inguinal hernia repair     BRONCHIAL NEEDLE ASPIRATION BIOPSY  04/05/2021   Procedure: BRONCHIAL NEEDLE ASPIRATION BIOPSIES;  Surgeon: Josephine Igo, DO;  Location: [facility];  Service: Pulmonary;;   COLONOSCOPY  01/23/2012   Procedure: COLONOSCOPY;  Surgeon: Corbin Ade, MD;  Location: [facility];  Service: Endoscopy;  Laterality: N/A;  9:30 AM   COLONOSCOPY N/A 10/11/2015   Procedure:  COLONOSCOPY;  Surgeon: Corbin Ade, MD;  Location: [facility];  Service: Endoscopy;  Laterality: N/A;  830   COLONOSCOPY N/A 10/14/2019   Procedure: COLONOSCOPY;  Surgeon: Corbin Ade, MD;  Location: [facility];  Service: Endoscopy;  Laterality: N/A;  1:45   POLYPECTOMY  10/14/2019   Procedure: POLYPECTOMY;  Surgeon: Corbin Ade, MD;  Location: [facility];  Service: Endoscopy;;  ascending colon, descending colon   VIDEO BRONCHOSCOPY WITH ENDOBRONCHIAL ULTRASOUND N/A 04/05/2021   Procedure: VIDEO BRONCHOSCOPY WITH ENDOBRONCHIAL ULTRASOUND;  Surgeon: Josephine Igo, DO;  Location: [facility];  Service: Pulmonary;  Laterality: N/A;    REVIEW OF SYSTEMS:  A comprehensive review of systems was negative.   PHYSICAL EXAMINATION: General appearance: alert, cooperative, appears stated age, and no distress Head: Normocephalic, without obvious abnormality, atraumatic Neck: no adenopathy, no JVD, supple, symmetrical, trachea midline, and thyroid not enlarged, symmetric, no tenderness/mass/nodules Lymph nodes: Cervical, supraclavicular, and axillary nodes normal. Resp: clear to auscultation bilaterally Back: symmetric, no curvature. ROM normal. No CVA tenderness. Cardio: regular rate and rhythm, S1, S2 normal, no murmur, click,  rub or gallop GI: soft, non-tender; bowel sounds normal; no masses,  no organomegaly Extremities: extremities normal, atraumatic, no cyanosis or edema  ECOG PERFORMANCE STATUS: 1 - Symptomatic but completely ambulatory  Blood pressure (!) 143/93, pulse 92, temperature 97.8 F (36.6 C), temperature source Temporal, resp. rate 17, height 5\' 11"  (1.803 m), weight 181 lb 12.8 oz (82.5 kg), SpO2 100%.  LABORATORY DATA: Lab Results  Component Value Date   WBC 12.6 (H) 12/04/2023   HGB 11.5 (L) 12/04/2023   HCT 39.0 12/04/2023   MCV 87.2 12/04/2023   PLT 276 12/04/2023      Chemistry      Component Value Date/Time   NA 144 11/26/2023 1135   K 4.1  11/26/2023 1135   CL 105 11/26/2023 1135   CO2 23 11/26/2023 1135   BUN 11 11/26/2023 1135   CREATININE 0.99 11/26/2023 1135   CREATININE 1.02 10/29/2023 0939      Component Value Date/Time   CALCIUM 8.7 11/26/2023 1135   ALKPHOS 73 11/26/2023 1135   AST 19 11/26/2023 1135   AST 18 10/29/2023 0939   ALT 12 11/26/2023 1135   ALT 14 10/29/2023 0939   BILITOT <0.2 11/26/2023 1135   BILITOT 0.3 10/29/2023 0939       RADIOGRAPHIC STUDIES: CT CHEST ABDOMEN PELVIS W CONTRAST Result Date: 11/22/2023 CLINICAL DATA:  Non-small cell lung cancer. Restaging. * Tracking Code: BO * EXAM: CT CHEST, ABDOMEN, AND PELVIS WITH CONTRAST TECHNIQUE: Multidetector CT imaging of the chest, abdomen and pelvis was performed following the standard protocol during bolus administration of intravenous contrast. RADIATION DOSE REDUCTION: This exam was performed according to the departmental dose-optimization program which includes automated exposure control, adjustment of the mA and/or kV according to patient size and/or use of iterative reconstruction technique. CONTRAST:  OMNIPAQUE IOHEXOL 300 MG/ML  SOLN COMPARISON:  08/13/2023 FINDINGS: CT CHEST FINDINGS Cardiovascular: Heart size is normal. Small pericardial effusion is identified which is unchanged in volume from the previous exam. Aortic atherosclerosis. Coronary artery calcifications. Mediastinum/Nodes: Thyroid gland, trachea and esophagus are normal. No enlarged axillary, supraclavicular, mediastinal, or hilar lymph nodes. Lungs/Pleura: Multifocal pulmonary nodules are identified predominantly within the right upper lobe which appear increased in multiplicity compared with the previous exam. Index nodules include: -peripheral nodule within the right upper lobe measuring 6 mm is stable in the interval, image 59/6. -Irregular non solid nodule within the right upper lobe measures 6 mm, image 79/6. Stable compared with the previous exam. -New nodule within the  posterior right upper lobe measures 1.2 cm, image 64/6. Unchanged in the interval. -New nodule within the anterolateral right upper lobe measures 6 mm, image 61/6. -New nodule within the medial posteromedial right upper lobe measures 9 mm, image 61/6. -Two sub solid nodules within the lateral left upper lobe are unchanged measuring 0.5 cm and 0.5 cm, image 83/6. Musculoskeletal: No chest wall mass or suspicious bone lesions identified. CT ABDOMEN PELVIS FINDINGS Hepatobiliary: Small low-density lesion within the inferior right lobe of liver is stable measuring 0.7 cm, image 79/2. 4 mm low density lesion within the posterior and inferior right lobe of liver is also unchanged, image 71/2. These are both technically too small to reliably characterize. No suspicious liver lesions noted at this time. Gallbladder appears normal. No bile duct dilatation. Pancreas: Unremarkable. No pancreatic ductal dilatation or surrounding inflammatory changes. Spleen: Normal in size without focal abnormality. Adrenals/Urinary Tract: Normal adrenal glands. The right kidney is normal. Nonobstructing stone within upper pole of the  left kidney measures 0.9 cm. No kidney mass or hydronephrosis identified bilaterally. Urinary bladder appears normal. Stomach/Bowel: Stomach is within normal limits. No bowel wall thickening, inflammation. Mildly dilated loops of small bowel are noted within the anterior abdomen measuring up to 2.9 cm. Vascular/Lymphatic: Aortic atherosclerosis. No aneurysm. No signs of abdominopelvic adenopathy. Reproductive: No mass identified. Other: No free fluid or fluid collections identified. Musculoskeletal: No acute or significant osseous findings. L5-S1 degenerative disc disease. IMPRESSION: 1. Multifocal pulmonary nodules are identified predominantly within the right upper lobe which appear increased in multiplicity compared with the previous exam. Findings are concerning for worsening pulmonary metastatic disease. 2.  No signs of metastatic disease within the abdomen or pelvis. 3. Mildly dilated loops of small bowel are noted within the anterior abdomen measuring up to 2.9 cm. Findings are nonspecific and may be related to ileus or enteritis. 4. Nonobstructing left renal calculus. 5. Coronary artery calcifications. 6.  Aortic Atherosclerosis (ICD10-I70.0). Electronically Signed   By: Signa Kell M.D.   On: 11/22/2023 16:58   DG Abd Portable 1V-Small Bowel Obstruction Protocol-initial, 8 hr delay Result Date: 11/21/2023 CLINICAL DATA:  Small-bowel obstruction, 8 hour delay EXAM: PORTABLE ABDOMEN - 1 VIEW COMPARISON:  Radiograph 11/20/2023 at 2:59 p.m. FINDINGS: Enteric tube tip and side-port in the stomach. Enteric contrast is present throughout the colon. No acute osseous abnormality. IMPRESSION: No small-bowel obstruction. Electronically Signed   By: Minerva Fester M.D.   On: 11/21/2023 21:08   DG Abd 1 View Result Date: 11/20/2023 CLINICAL DATA:  Nasogastric tube present. EXAM: ABDOMEN - 1 VIEW COMPARISON:  Radiograph earlier today FINDINGS: Tip and side port of the enteric tube below the diaphragm in the stomach. No bowel dilatation in the included upper abdomen. IMPRESSION: Tip and side port of the enteric tube below the diaphragm in the stomach. Electronically Signed   By: Narda Rutherford M.D.   On: 11/20/2023 16:03   DG Abd 1 View Result Date: 11/20/2023 CLINICAL DATA:  Abdominal pain, check gastric catheter placement EXAM: ABDOMEN - 1 VIEW COMPARISON:  11/18/2023 FINDINGS: Gastric catheter has been advanced into the stomach although the proximal side port lies in the distal esophagus. This should be advanced deeper into the stomach. Mild small bowel dilatation is noted centrally. No free air is noted. IMPRESSION: Persistent central small bowel dilatation. Gastric catheter as described. This should be advanced deeper into the stomach. Electronically Signed   By: Alcide Clever M.D.   On: 11/20/2023 10:21   DG  CHEST PORT 1 VIEW Result Date: 11/20/2023 CLINICAL DATA:  Chest pain and shortness of breath EXAM: PORTABLE CHEST 1 VIEW COMPARISON:  11/07/2022 FINDINGS: Cardiac shadow is stable. Gastric catheter extends into the stomach although the proximal side port lies in the distal esophagus. This could be advanced deeper into the stomach. Lungs are clear bilaterally. No bony abnormality is noted. IMPRESSION: Gastric catheter as described. This could be advanced deeper into the stomach. Electronically Signed   By: Alcide Clever M.D.   On: 11/20/2023 10:20   DG Abd Portable 1 View Result Date: 11/18/2023 CLINICAL DATA:  68 year old male status post nasogastric tube placement. EXAM: PORTABLE ABDOMEN - 1 VIEW COMPARISON:  CT of the abdomen and pelvis 11/18/2023. FINDINGS: Tip of nasogastric tube is in the distal esophagus shortly before the gastroesophageal junction. Numerous gas-filled loops of small bowel are noted throughout the central abdomen measuring up to 5.1 cm in diameter. Paucity of colonic gas. IMPRESSION: Tip of nasogastric tube is in the distal  esophagus and should be advanced approximately 15 cm for more optimal placement. Electronically Signed   By: Trudie Reed M.D.   On: 11/18/2023 05:25   CT ABDOMEN PELVIS W CONTRAST Result Date: 11/18/2023 CLINICAL DATA:  Abdominal pain and diarrhea. EXAM: CT ABDOMEN AND PELVIS WITH CONTRAST TECHNIQUE: Multidetector CT imaging of the abdomen and pelvis was performed using the standard protocol following bolus administration of intravenous contrast. RADIATION DOSE REDUCTION: This exam was performed according to the departmental dose-optimization program which includes automated exposure control, adjustment of the mA and/or kV according to patient size and/or use of iterative reconstruction technique. CONTRAST:  OMNIPAQUE IOHEXOL 300 MG/ML  SOLN COMPARISON:  November 11, 2023 FINDINGS: Lower chest: There is a small, stable pericardial effusion. Hepatobiliary: A  small, stable hepatic cyst is seen within the right lobe of the liver. No gallstones, gallbladder wall thickening, or biliary dilatation. Pancreas: Unremarkable. No pancreatic ductal dilatation or surrounding inflammatory changes. Spleen: Normal in size without focal abnormality. Adrenals/Urinary Tract: Adrenal glands are unremarkable. Kidneys are normal in size, without obstructing renal calculi, focal lesion, or hydronephrosis. A 9 mm cluster of nonobstructing renal calculi are seen within the left kidney. Bladder is unremarkable. Stomach/Bowel: Stomach is within normal limits. Appendix appears normal. Multiple dilated small bowel loops are seen throughout the abdomen and pelvis (maximum small bowel diameter of approximately 3.7 cm). An abrupt transition joint is seen within the left lower quadrant (best seen on coronal reformatted images 55 through 59, CT series 5). Vascular/Lymphatic: Aortic atherosclerosis. No enlarged abdominal or pelvic lymph nodes. Reproductive: There is mild to moderate severity prostate gland enlargement and prostate calcification. Other: No abdominal wall hernia or abnormality. No abdominopelvic ascites. Musculoskeletal: No acute or significant osseous findings. IMPRESSION: 1. Small bowel obstruction with an abrupt transition point within the left lower quadrant. 2. Small, stable pericardial effusion. 3. Nonobstructing left renal calculi. 4. Mild to moderate severity prostate gland enlargement. 5. Aortic atherosclerosis. Aortic Atherosclerosis (ICD10-I70.0). Electronically Signed   By: Aram Candela M.D.   On: 11/18/2023 01:57    ASSESSMENT AND PLAN: This is a very pleasant 68 years old white male recently diagnosed with stage IV (T1b, N3, M1 C) non-small cell lung cancer favoring adenocarcinoma presented with right upper lobe lung nodule in addition to right hilar, subcarinal and bilateral mediastinal as well as supraclavicular lymphadenopathy.  The patient also has bone and brain  metastasis diagnosed in June 2022.  His PD-L1 expression is 80% and his molecular studies showed KRAS G12C mutation. The patient underwent SRS to metastatic brain lesion under the care of Dr. Kathrynn Running and he is currently undergoing systemic chemotherapy with carboplatin for AUC of 5, Alimta 500 Mg/M2 and Keytruda 200 Mg IV every 3 weeks status post 43 cycles.  Starting from cycle #5 the patient will be treated with maintenance treatment with Alimta and Keytruda every 3 weeks.  He has been tolerating this treatment fairly well.    Adenocarcinoma of the Lung Stage IV adenocarcinoma of the lung with KRAS G12C mutation, initially diagnosed in June 2022. Currently undergoing chemotherapy with Alimta, and Keytruda, on cycle 44. Treatment delayed by two weeks due to recent small bowel obstruction. No current symptoms; weight regained post-hospitalization. Lab work shows slightly elevated WBC, likely due to recent obstruction. Discussed risks and benefits of continuing chemotherapy, including potential side effects (nausea, vomiting, fatigue). Emphasized importance of monitoring for new symptoms and maintaining regular follow-ups. - Administer cycle 44 of Alimta and Keytruda - Schedule next treatment in  three weeks  Small Bowel Obstruction Recent small bowel obstruction managed conservatively with NG tube. Symptoms included bloating, abdominal distention, minor discomfort, and diarrhea. No current symptoms. Discussed potential for recurrence and importance of seeking immediate medical attention if symptoms reappear. - Monitor for recurrence of symptoms - Adjust appointments as needed based on condition  Follow-up - Schedule follow-up appointment in three weeks.   The patient was advised to call immediately if he has any concerning symptoms in the interval. The patient voices understanding of current disease status and treatment options and is in agreement with the current care plan.  All questions were  answered. The patient knows to call the clinic with any problems, questions or concerns. We can certainly see the patient much sooner if necessary. The total time spent in the appointment was 20 minutes.  Disclaimer: This note was dictated with voice recognition software. Similar sounding words can inadvertently be transcribed and may not be corrected upon review.

## 2023-12-09 ENCOUNTER — Ambulatory Visit: Payer: Commercial Managed Care - PPO

## 2023-12-09 ENCOUNTER — Other Ambulatory Visit: Payer: Commercial Managed Care - PPO

## 2023-12-09 ENCOUNTER — Ambulatory Visit: Payer: Commercial Managed Care - PPO | Admitting: Physician Assistant

## 2023-12-12 ENCOUNTER — Other Ambulatory Visit: Payer: Self-pay | Admitting: Family Medicine

## 2023-12-12 ENCOUNTER — Other Ambulatory Visit: Payer: Self-pay

## 2023-12-12 ENCOUNTER — Other Ambulatory Visit (HOSPITAL_COMMUNITY): Payer: Self-pay

## 2023-12-12 DIAGNOSIS — E1159 Type 2 diabetes mellitus with other circulatory complications: Secondary | ICD-10-CM

## 2023-12-12 DIAGNOSIS — R630 Anorexia: Secondary | ICD-10-CM

## 2023-12-12 MED ORDER — LOSARTAN POTASSIUM 50 MG PO TABS
75.0000 mg | ORAL_TABLET | Freq: Every day | ORAL | 0 refills | Status: DC
  Filled 2023-12-12: qty 135, 90d supply, fill #0

## 2023-12-13 ENCOUNTER — Other Ambulatory Visit: Payer: Self-pay

## 2023-12-13 ENCOUNTER — Encounter: Payer: Self-pay | Admitting: Family Medicine

## 2023-12-13 ENCOUNTER — Other Ambulatory Visit: Payer: Self-pay | Admitting: Family Medicine

## 2023-12-13 ENCOUNTER — Encounter: Payer: Self-pay | Admitting: Internal Medicine

## 2023-12-13 ENCOUNTER — Other Ambulatory Visit (HOSPITAL_COMMUNITY): Payer: Self-pay

## 2023-12-13 DIAGNOSIS — F3340 Major depressive disorder, recurrent, in remission, unspecified: Secondary | ICD-10-CM

## 2023-12-13 MED ORDER — VENLAFAXINE HCL ER 75 MG PO CP24
75.0000 mg | ORAL_CAPSULE | Freq: Every day | ORAL | 1 refills | Status: DC
  Filled 2023-12-13: qty 90, 90d supply, fill #0
  Filled 2023-12-23: qty 60, 60d supply, fill #0

## 2023-12-13 MED ORDER — FOLIC ACID 1 MG PO TABS
1.0000 mg | ORAL_TABLET | Freq: Every day | ORAL | 0 refills | Status: DC
Start: 2023-12-13 — End: 2024-04-02
  Filled 2023-12-13: qty 90, 90d supply, fill #0

## 2023-12-17 ENCOUNTER — Other Ambulatory Visit (HOSPITAL_COMMUNITY): Payer: Self-pay

## 2023-12-23 ENCOUNTER — Inpatient Hospital Stay: Payer: Commercial Managed Care - PPO

## 2023-12-23 ENCOUNTER — Inpatient Hospital Stay: Payer: Commercial Managed Care - PPO | Attending: Physician Assistant

## 2023-12-23 ENCOUNTER — Inpatient Hospital Stay (HOSPITAL_BASED_OUTPATIENT_CLINIC_OR_DEPARTMENT_OTHER): Payer: Commercial Managed Care - PPO | Admitting: Internal Medicine

## 2023-12-23 ENCOUNTER — Encounter: Payer: Self-pay | Admitting: Internal Medicine

## 2023-12-23 ENCOUNTER — Encounter: Payer: Self-pay | Admitting: Family Medicine

## 2023-12-23 ENCOUNTER — Other Ambulatory Visit (HOSPITAL_COMMUNITY): Payer: Self-pay

## 2023-12-23 VITALS — BP 137/89 | HR 88 | Temp 97.7°F | Resp 17 | Ht 71.0 in | Wt 180.3 lb

## 2023-12-23 DIAGNOSIS — Z5112 Encounter for antineoplastic immunotherapy: Secondary | ICD-10-CM | POA: Insufficient documentation

## 2023-12-23 DIAGNOSIS — C7951 Secondary malignant neoplasm of bone: Secondary | ICD-10-CM | POA: Diagnosis not present

## 2023-12-23 DIAGNOSIS — C7931 Secondary malignant neoplasm of brain: Secondary | ICD-10-CM | POA: Diagnosis not present

## 2023-12-23 DIAGNOSIS — Z79899 Other long term (current) drug therapy: Secondary | ICD-10-CM | POA: Diagnosis not present

## 2023-12-23 DIAGNOSIS — C3491 Malignant neoplasm of unspecified part of right bronchus or lung: Secondary | ICD-10-CM

## 2023-12-23 DIAGNOSIS — C3411 Malignant neoplasm of upper lobe, right bronchus or lung: Secondary | ICD-10-CM | POA: Insufficient documentation

## 2023-12-23 DIAGNOSIS — K56609 Unspecified intestinal obstruction, unspecified as to partial versus complete obstruction: Secondary | ICD-10-CM | POA: Insufficient documentation

## 2023-12-23 DIAGNOSIS — Z5111 Encounter for antineoplastic chemotherapy: Secondary | ICD-10-CM | POA: Diagnosis not present

## 2023-12-23 LAB — CBC WITH DIFFERENTIAL (CANCER CENTER ONLY)
Abs Immature Granulocytes: 0.1 10*3/uL — ABNORMAL HIGH (ref 0.00–0.07)
Basophils Absolute: 0.1 10*3/uL (ref 0.0–0.1)
Basophils Relative: 1 %
Eosinophils Absolute: 0.1 10*3/uL (ref 0.0–0.5)
Eosinophils Relative: 1 %
HCT: 39.1 % (ref 39.0–52.0)
Hemoglobin: 11.8 g/dL — ABNORMAL LOW (ref 13.0–17.0)
Immature Granulocytes: 1 %
Lymphocytes Relative: 8 %
Lymphs Abs: 1.2 10*3/uL (ref 0.7–4.0)
MCH: 25.1 pg — ABNORMAL LOW (ref 26.0–34.0)
MCHC: 30.2 g/dL (ref 30.0–36.0)
MCV: 83.2 fL (ref 80.0–100.0)
Monocytes Absolute: 0.5 10*3/uL (ref 0.1–1.0)
Monocytes Relative: 3 %
Neutro Abs: 12.8 10*3/uL — ABNORMAL HIGH (ref 1.7–7.7)
Neutrophils Relative %: 86 %
Platelet Count: 406 10*3/uL — ABNORMAL HIGH (ref 150–400)
RBC: 4.7 MIL/uL (ref 4.22–5.81)
RDW: 19.1 % — ABNORMAL HIGH (ref 11.5–15.5)
WBC Count: 14.6 10*3/uL — ABNORMAL HIGH (ref 4.0–10.5)
nRBC: 0 % (ref 0.0–0.2)

## 2023-12-23 LAB — CMP (CANCER CENTER ONLY)
ALT: 23 U/L (ref 0–44)
AST: 22 U/L (ref 15–41)
Albumin: 4 g/dL (ref 3.5–5.0)
Alkaline Phosphatase: 68 U/L (ref 38–126)
Anion gap: 7 (ref 5–15)
BUN: 14 mg/dL (ref 8–23)
CO2: 29 mmol/L (ref 22–32)
Calcium: 9.4 mg/dL (ref 8.9–10.3)
Chloride: 106 mmol/L (ref 98–111)
Creatinine: 1.03 mg/dL (ref 0.61–1.24)
GFR, Estimated: >60 mL/min (ref >60)
Glucose, Bld: 91 mg/dL (ref 70–99)
Potassium: 3.8 mmol/L (ref 3.5–5.1)
Sodium: 142 mmol/L (ref 135–145)
Total Bilirubin: 0.2 mg/dL (ref 0.0–1.2)
Total Protein: 7.2 g/dL (ref 6.5–8.1)

## 2023-12-23 MED ORDER — CYANOCOBALAMIN 1000 MCG/ML IJ SOLN
1000.0000 ug | Freq: Once | INTRAMUSCULAR | Status: AC
  Administered 2023-12-23: 1000 ug via INTRAMUSCULAR
  Filled 2023-12-23: qty 1

## 2023-12-23 MED ORDER — PROCHLORPERAZINE MALEATE 10 MG PO TABS
10.0000 mg | ORAL_TABLET | Freq: Once | ORAL | Status: AC
  Administered 2023-12-23: 10 mg via ORAL
  Filled 2023-12-23: qty 1

## 2023-12-23 MED ORDER — SODIUM CHLORIDE 0.9 % IV SOLN
200.0000 mg | Freq: Once | INTRAVENOUS | Status: AC
  Administered 2023-12-23: 200 mg via INTRAVENOUS
  Filled 2023-12-23: qty 200

## 2023-12-23 MED ORDER — SODIUM CHLORIDE 0.9 % IV SOLN: Freq: Once | INTRAVENOUS | Status: AC

## 2023-12-23 MED ORDER — SODIUM CHLORIDE 0.9 % IV SOLN
500.0000 mg/m2 | Freq: Once | INTRAVENOUS | Status: AC
  Administered 2023-12-23: 1000 mg via INTRAVENOUS
  Filled 2023-12-23: qty 40

## 2023-12-23 NOTE — Patient Instructions (Signed)
 [facility] - A DEPT OF [facility]  Discharge Instructions: Thank you for choosing [facility] to provide your oncology and hematology care.   If you have a lab appointment with the Cancer Center, please go directly to the Cancer Center and check in at the registration area.   Wear comfortable clothing and clothing appropriate for easy access to any Portacath or PICC line.   We strive to give you quality time with your provider. You may need to reschedule your appointment if you arrive late (15 or more minutes).  Arriving late affects you and other patients whose appointments are after yours.  Also, if you miss three or more appointments without notifying the office, you may be dismissed from the clinic at the provider's discretion.      For prescription refill requests, have your pharmacy contact our office and allow 72 hours for refills to be completed.    Today you received the following chemotherapy and/or immunotherapy agents: Keytruda, Alimta.       To help prevent nausea and vomiting after your treatment, we encourage you to take your nausea medication as directed.  BELOW ARE SYMPTOMS THAT SHOULD BE REPORTED IMMEDIATELY: *FEVER GREATER THAN 100.4 F (38 C) OR HIGHER *CHILLS OR SWEATING *NAUSEA AND VOMITING THAT IS NOT CONTROLLED WITH YOUR NAUSEA MEDICATION *UNUSUAL SHORTNESS OF BREATH *UNUSUAL BRUISING OR BLEEDING *URINARY PROBLEMS (pain or burning when urinating, or frequent urination) *BOWEL PROBLEMS (unusual diarrhea, constipation, pain near the anus) TENDERNESS IN MOUTH AND THROAT WITH OR WITHOUT PRESENCE OF ULCERS (sore throat, sores in mouth, or a toothache) UNUSUAL RASH, SWELLING OR PAIN  UNUSUAL VAGINAL DISCHARGE OR ITCHING   Items with * indicate a potential emergency and should be followed up as soon as possible or go to the Emergency Department if any problems should occur.  Please show the CHEMOTHERAPY ALERT CARD or  IMMUNOTHERAPY ALERT CARD at check-in to the Emergency Department and triage nurse.  Should you have questions after your visit or need to cancel or reschedule your appointment, please contact [facility] - A DE[facility]  Dept: [phone]  and follow the prompts.  Office hours are 8:00 a.m. to 4:30 p.m. Monday - Friday. Please note that voicemails left after 4:00 p.m. may not be returned until the following business day.  We are closed weekends and major holidays. You have access to a nurse at all times for urgent questions. Please call the main number to the clinic Dept: [phone] and follow the prompts.   For any non-urgent questions, you may also contact your provider using MyChart. We now offer e-Visits for anyone 61 and older to request care online for non-urgent symptoms. For details visit [email or web address].   Also download the MyChart app! Go to the app store, search "MyChart", open the app, select , and log in with your MyChart username and password.

## 2023-12-23 NOTE — Progress Notes (Signed)
 [facility] [phone]   [phone]  OFFICE PROGRESS NOTE  Bradley Masters, FNP [address] [place] [address]  DIAGNOSIS: Stage IV (T1b, N3, M1C) non-small cell lung cancer, favoring adenocarcinoma presented with right upper lobe lung nodule in addition to right hilar, subcarinal and bilateral mediastinal as well as supraclavicular lymphadenopathy in addition to bone and brain metastasis diagnosed in June 2022.     PD-L1 expression 80%.     Molecular Studies:  Biomarker Findings Microsatellite status - MS-Stable Tumor Mutational Burden - 6 Muts/Mb Genomic Findings For a complete list of the genes assayed, please refer to the Appendix. KRAS G12C, amplification ATM S470* CCND1 amplification - equivocal? HGF amplification - equivocal? MYC amplification - equivocal? FGF19 amplification - equivocal? FGF3 amplification - equivocal? FGF4 amplification - equivocal? NFKBIA amplification NKX2-1 amplification RAD21 amplification - equivocal? [id number]*26 TERT promoter -124C>T TP53 rearrangement exon 9 7 Disease relevant genes with no reportable alterations: ALK, BRAF, EGFR, ERBB2, MET, RET, ROS1   PRIOR THERAPY: SRS to the metastatic brain lesions under the care of Dr. Kathrynn Running.  Last treatment on 05/04/2021.   CURRENT THERAPY: Palliative systemic chemotherapy with carboplatin for an AUC 5, Alimta 500 mg/m2 and, Keytruda 200 mg IV every 3 weeks.  First dose on 05/08/2021.  Status post 44 cycles.  Starting from cycle #5 he is on maintenance treatment with Alimta and Keytruda every 3 weeks.  INTERVAL HISTORY: Bradley Goodman 68 y.o. male returns to the clinic today for follow-up visit accompanied by his wife. Discussed the use of AI scribe software for clinical note transcription with the patient, who gave verbal consent to proceed.  History of Present Illness   Bradley Goodman is a 68 year old male with stage four non-small cell  lung cancer adenocarcinoma who presents for routine follow-up. His daughter is very involved in his care.  He was diagnosed with stage four non-small cell lung cancer adenocarcinoma in June 2022. He is currently on maintenance treatment with Zalimta and Keytruda every three weeks and has received a total of 44 cycles. He feels well and mentions that he felt the best last week than he has in a long time. No new chest pain, shortness of breath, or coughing. His bowel movements are reported as good.  A recent scan from February 3rd showed a slight increase in the number of lung nodules, but no significant changes were noted. He is recovering from a recent bowel obstruction and inflammation, which may have influenced the scan results.        MEDICAL HISTORY: Past Medical History:  Diagnosis Date   Cancer [facility])    Cervical spondylolysis    Essential hypertension    GERD (gastroesophageal reflux disease)    History of kidney stones    History of migraine    Hyperlipidemia    Hypertension    PONV (postoperative nausea and vomiting)    Type 2 diabetes mellitus (HCC)     ALLERGIES:  has no known allergies.  MEDICATIONS:  Current Outpatient Medications  Medication Sig Dispense Refill   chlorproMAZINE (THORAZINE) 10 MG tablet Take 1 tablet (10 mg total) by mouth 3 (three) times daily as needed for hiccoughs. 90 tablet 0   cholecalciferol (VITAMIN D) 1000 UNITS tablet Take 1,000 Units by mouth every evening.     Continuous Glucose Sensor (FREESTYLE LIBRE 3 SENSOR) MISC Place 1 sensor on the skin every 14 days. Use to check glucose continuously 6  each 3   dexamethasone (DECADRON) 1 MG tablet Take 1 tablet (1 mg total) by mouth daily with breakfast. 90 tablet 1   docusate sodium (COLACE) 100 MG capsule Take 100 mg by mouth daily.     folic acid (FOLVITE) 1 MG tablet Take 1 tablet (1 mg total) by mouth daily. 90 tablet 0   Krill Oil 300 MG CAPS Take 300 mg by mouth every evening.      levothyroxine (SYNTHROID) 88 MCG tablet Take 1 tablet (88 mcg total) by mouth daily before breakfast. 90 tablet 1   loratadine (CLARITIN) 10 MG tablet Take 10 mg by mouth every evening.      losartan (COZAAR) 50 MG tablet Take 1.5 tablets (75 mg total) by mouth daily. 135 tablet 0   mirtazapine (REMERON) 15 MG tablet Take 1 tablet (15 mg total) by mouth at bedtime. 90 tablet 0   Multiple Vitamins-Minerals (MULTIVITAMIN GUMMIES ADULT PO) Take 2 each by mouth daily.     pantoprazole (PROTONIX) 40 MG tablet Take 1 tablet (40 mg total) by mouth every evening. 90 tablet 1   PRESCRIPTION MEDICATION Testosterone injection     rosuvastatin (CRESTOR) 20 MG tablet Take 1 tablet (20 mg total) by mouth daily. 90 tablet 1   SYRINGE-NEEDLE, DISP, 3 ML (B-D 3CC LUER-LOK SYR 21GX1-1/2) 21G X 1-1/2" 3 ML MISC Use to inject testosterone every week 100 each 2   testosterone cypionate (DEPOTESTOTERONE CYPIONATE) 100 MG/ML injection Take 50mg  ( 0.12ml) and 100mg  ( 1ml) into muscle alternatively weekly. 10 mL 1   venlafaxine XR (EFFEXOR-XR) 75 MG 24 hr capsule Take 2 capsules (150 mg total) by mouth daily with breakfast.     venlafaxine XR (EFFEXOR-XR) 75 MG 24 hr capsule Take 1 capsule (75 mg total) by mouth daily with breakfast. 90 capsule 1   No current facility-administered medications for this visit.    SURGICAL HISTORY:  Past Surgical History:  Procedure Laterality Date   Bilateral inguinal hernia repair     BRONCHIAL NEEDLE ASPIRATION BIOPSY  04/05/2021   Procedure: BRONCHIAL NEEDLE ASPIRATION BIOPSIES;  Surgeon: Josephine Igo, DO;  [facility];  Service: Pulmonary;;   COLONOSCOPY  01/23/2012   Procedure: COLONOSCOPY;  Surgeon: Corbin Ade, MD;  [facility];  Service: Endoscopy;  Laterality: N/A;  9:30 AM   COLONOSCOPY N/A 10/11/2015   Procedure: COLONOSCOPY;  Surgeon: Corbin Ade, MD;  [facility];  Service: Endoscopy;  Laterality: N/A;  830   COLONOSCOPY N/A  10/14/2019   Procedure: COLONOSCOPY;  Surgeon: Corbin Ade, MD;  [facility];  Service: Endoscopy;  Laterality: N/A;  1:45   POLYPECTOMY  10/14/2019   Procedure: POLYPECTOMY;  Surgeon: Corbin Ade, MD;  [facility];  Service: Endoscopy;;  ascending colon, descending colon   VIDEO BRONCHOSCOPY WITH ENDOBRONCHIAL ULTRASOUND N/A 04/05/2021   Procedure: VIDEO BRONCHOSCOPY WITH ENDOBRONCHIAL ULTRASOUND;  Surgeon: Josephine Igo, DO;  Location: [facility];  Service: Pulmonary;  Laterality: N/A;    REVIEW OF SYSTEMS:  A comprehensive review of systems was negative.   PHYSICAL EXAMINATION: General appearance: alert, cooperative, appears stated age, and no distress Head: Normocephalic, without obvious abnormality, atraumatic Neck: no adenopathy, no JVD, supple, symmetrical, trachea midline, and thyroid not enlarged, symmetric, no tenderness/mass/nodules Lymph nodes: Cervical, supraclavicular, and axillary nodes normal. Resp: clear to auscultation bilaterally Back: symmetric, no curvature. ROM normal. No CVA tenderness. Cardio: regular rate and rhythm, S1, S2 normal, no murmur,  click, rub or gallop GI: soft, non-tender; bowel sounds normal; no masses,  no organomegaly Extremities: extremities normal, atraumatic, no cyanosis or edema  ECOG PERFORMANCE STATUS: 1 - Symptomatic but completely ambulatory  Blood pressure 137/89, pulse 88, temperature 97.7 F (36.5 C), temperature source Temporal, resp. rate 17, height 5\' 11"  (1.803 m), weight 180 lb 4.8 oz (81.8 kg), SpO2 100%.  LABORATORY DATA: Lab Results  Component Value Date   WBC 14.6 (H) 12/23/2023   HGB 11.8 (L) 12/23/2023   HCT 39.1 12/23/2023   MCV 83.2 12/23/2023   PLT 406 (H) 12/23/2023      Chemistry      Component Value Date/Time   NA 141 12/04/2023 0753   NA 144 11/26/2023 1135   K 3.8 12/04/2023 0753   CL 107 12/04/2023 0753   CO2 27 12/04/2023 0753   BUN 14 12/04/2023 0753   BUN 11 11/26/2023  1135   CREATININE 0.97 12/04/2023 0753      Component Value Date/Time   CALCIUM 8.9 12/04/2023 0753   ALKPHOS 59 12/04/2023 0753   AST 18 12/04/2023 0753   ALT 16 12/04/2023 0753   BILITOT 0.2 12/04/2023 0753       RADIOGRAPHIC STUDIES: No results found.   ASSESSMENT AND PLAN: This is a very pleasant 68 years old white male recently diagnosed with stage IV (T1b, N3, M1 C) non-small cell lung cancer favoring adenocarcinoma presented with right upper lobe lung nodule in addition to right hilar, subcarinal and bilateral mediastinal as well as supraclavicular lymphadenopathy.  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  His PD-L1 expression is 80% and his molecular studies showed KRAS G12C mutation. The patient underwent SRS to metastatic brain lesion under the care of Dr. Kathrynn Running and he is currently undergoing systemic chemotherapy with carboplatin for AUC of 5, Alimta 500 Mg/M2 and Keytruda 200 Mg IV every 3 weeks status post 44 cycles.  Starting from cycle #5 the patient will be treated with maintenance treatment with Alimta and Keytruda every 3 weeks.  He has been tolerating this treatment fairly well.    Stage IV non-small cell lung cancer (adenocarcinoma) Diagnosed in June 2022. Currently on maintenance treatment with Zalimta and Keytruda every three weeks, with 44 cycles completed. Recent scan on February 3rd showed a slight increase in lung nodules, not significantly different from previous scans. Recovering from bowel obstruction and inflammation, which may have influenced scan results. Tumor has KRAS G12C mutation, providing alternative treatment options if cancer progresses. - Continue Zalimta and Keytruda every three weeks. - Monitor lung nodules for changes. - Consider oral medications for KRAS G12C mutation if cancer progresses. - Perform scan after next follow-up.  Bowel obstruction and inflammation Recovering from recent bowel obstruction and  inflammation. No new complaints or issues reported. - Monitor recovery.   The patient was advised to call immediately if he has any concerning symptoms in the interval. The patient voices understanding of current disease status and treatment options and is in agreement with the current care plan.  All questions were answered. The patient knows to call the clinic with any problems, questions or concerns. We can certainly see the patient much sooner if necessary. The total time spent in the appointment was 20 minutes.  Disclaimer: This note was dictated with voice recognition software. Similar sounding words can inadvertently be transcribed and may not be corrected upon review.

## 2023-12-24 ENCOUNTER — Other Ambulatory Visit: Payer: Self-pay | Admitting: Family Medicine

## 2023-12-24 ENCOUNTER — Other Ambulatory Visit (HOSPITAL_COMMUNITY): Payer: Self-pay

## 2023-12-24 DIAGNOSIS — F3340 Major depressive disorder, recurrent, in remission, unspecified: Secondary | ICD-10-CM

## 2023-12-24 MED ORDER — VENLAFAXINE HCL ER 75 MG PO CP24
150.0000 mg | ORAL_CAPSULE | Freq: Every day | ORAL | 2 refills | Status: DC
  Filled 2023-12-24 – 2024-01-28 (×4): qty 180, 90d supply, fill #0

## 2023-12-25 ENCOUNTER — Other Ambulatory Visit: Payer: Self-pay

## 2023-12-25 ENCOUNTER — Other Ambulatory Visit (HOSPITAL_COMMUNITY): Payer: Self-pay

## 2023-12-30 ENCOUNTER — Ambulatory Visit: Payer: Commercial Managed Care - PPO

## 2023-12-30 ENCOUNTER — Ambulatory Visit: Payer: Commercial Managed Care - PPO | Admitting: Internal Medicine

## 2023-12-30 ENCOUNTER — Other Ambulatory Visit: Payer: Commercial Managed Care - PPO

## 2024-01-07 DIAGNOSIS — H02831 Dermatochalasis of right upper eyelid: Secondary | ICD-10-CM | POA: Diagnosis not present

## 2024-01-07 DIAGNOSIS — H52223 Regular astigmatism, bilateral: Secondary | ICD-10-CM | POA: Diagnosis not present

## 2024-01-07 DIAGNOSIS — H524 Presbyopia: Secondary | ICD-10-CM | POA: Diagnosis not present

## 2024-01-07 DIAGNOSIS — H2513 Age-related nuclear cataract, bilateral: Secondary | ICD-10-CM | POA: Diagnosis not present

## 2024-01-07 DIAGNOSIS — H02834 Dermatochalasis of left upper eyelid: Secondary | ICD-10-CM | POA: Diagnosis not present

## 2024-01-07 DIAGNOSIS — E119 Type 2 diabetes mellitus without complications: Secondary | ICD-10-CM | POA: Diagnosis not present

## 2024-01-07 LAB — HM DIABETES EYE EXAM

## 2024-01-13 ENCOUNTER — Inpatient Hospital Stay (HOSPITAL_BASED_OUTPATIENT_CLINIC_OR_DEPARTMENT_OTHER): Payer: Commercial Managed Care - PPO | Admitting: Internal Medicine

## 2024-01-13 ENCOUNTER — Inpatient Hospital Stay: Payer: Commercial Managed Care - PPO

## 2024-01-13 ENCOUNTER — Inpatient Hospital Stay: Payer: Commercial Managed Care - PPO | Attending: Physician Assistant

## 2024-01-13 VITALS — BP 146/89

## 2024-01-13 VITALS — BP 154/82 | HR 88 | Temp 97.6°F | Resp 17 | Ht 71.0 in | Wt 180.2 lb

## 2024-01-13 DIAGNOSIS — C3411 Malignant neoplasm of upper lobe, right bronchus or lung: Secondary | ICD-10-CM | POA: Insufficient documentation

## 2024-01-13 DIAGNOSIS — C3490 Malignant neoplasm of unspecified part of unspecified bronchus or lung: Secondary | ICD-10-CM | POA: Diagnosis not present

## 2024-01-13 DIAGNOSIS — C3491 Malignant neoplasm of unspecified part of right bronchus or lung: Secondary | ICD-10-CM

## 2024-01-13 DIAGNOSIS — Z79899 Other long term (current) drug therapy: Secondary | ICD-10-CM | POA: Diagnosis not present

## 2024-01-13 DIAGNOSIS — C7931 Secondary malignant neoplasm of brain: Secondary | ICD-10-CM | POA: Insufficient documentation

## 2024-01-13 DIAGNOSIS — C7951 Secondary malignant neoplasm of bone: Secondary | ICD-10-CM | POA: Insufficient documentation

## 2024-01-13 DIAGNOSIS — Z5112 Encounter for antineoplastic immunotherapy: Secondary | ICD-10-CM | POA: Diagnosis not present

## 2024-01-13 DIAGNOSIS — Z5111 Encounter for antineoplastic chemotherapy: Secondary | ICD-10-CM | POA: Insufficient documentation

## 2024-01-13 LAB — CBC WITH DIFFERENTIAL (CANCER CENTER ONLY)
Abs Immature Granulocytes: 0.1 10*3/uL — ABNORMAL HIGH (ref 0.00–0.07)
Basophils Absolute: 0.1 10*3/uL (ref 0.0–0.1)
Basophils Relative: 1 %
Eosinophils Absolute: 0 10*3/uL (ref 0.0–0.5)
Eosinophils Relative: 0 %
HCT: 37 % — ABNORMAL LOW (ref 39.0–52.0)
Hemoglobin: 11.2 g/dL — ABNORMAL LOW (ref 13.0–17.0)
Immature Granulocytes: 1 %
Lymphocytes Relative: 12 %
Lymphs Abs: 1.4 10*3/uL (ref 0.7–4.0)
MCH: 25.2 pg — ABNORMAL LOW (ref 26.0–34.0)
MCHC: 30.3 g/dL (ref 30.0–36.0)
MCV: 83.1 fL (ref 80.0–100.0)
Monocytes Absolute: 0.3 10*3/uL (ref 0.1–1.0)
Monocytes Relative: 3 %
Neutro Abs: 9.7 10*3/uL — ABNORMAL HIGH (ref 1.7–7.7)
Neutrophils Relative %: 83 %
Platelet Count: 398 10*3/uL (ref 150–400)
RBC: 4.45 MIL/uL (ref 4.22–5.81)
RDW: 19.7 % — ABNORMAL HIGH (ref 11.5–15.5)
WBC Count: 11.6 10*3/uL — ABNORMAL HIGH (ref 4.0–10.5)
nRBC: 0 % (ref 0.0–0.2)

## 2024-01-13 LAB — CMP (CANCER CENTER ONLY)
ALT: 15 U/L (ref 0–44)
AST: 17 U/L (ref 15–41)
Albumin: 3.9 g/dL (ref 3.5–5.0)
Alkaline Phosphatase: 70 U/L (ref 38–126)
Anion gap: 7 (ref 5–15)
BUN: 13 mg/dL (ref 8–23)
CO2: 29 mmol/L (ref 22–32)
Calcium: 9.2 mg/dL (ref 8.9–10.3)
Chloride: 106 mmol/L (ref 98–111)
Creatinine: 0.98 mg/dL (ref 0.61–1.24)
GFR, Estimated: >60 mL/min (ref >60)
Glucose, Bld: 102 mg/dL — ABNORMAL HIGH (ref 70–99)
Potassium: 4.1 mmol/L (ref 3.5–5.1)
Sodium: 142 mmol/L (ref 135–145)
Total Bilirubin: 0.2 mg/dL (ref 0.0–1.2)
Total Protein: 7.1 g/dL (ref 6.5–8.1)

## 2024-01-13 MED ORDER — PROCHLORPERAZINE MALEATE 10 MG PO TABS
10.0000 mg | ORAL_TABLET | Freq: Once | ORAL | Status: AC
  Administered 2024-01-13: 10 mg via ORAL
  Filled 2024-01-13: qty 1

## 2024-01-13 MED ORDER — SODIUM CHLORIDE 0.9 % IV SOLN: Freq: Once | INTRAVENOUS | Status: AC

## 2024-01-13 MED ORDER — PEMETREXED DISODIUM CHEMO 500 MG/20ML IV SOLN
500.0000 mg/m2 | Freq: Once | INTRAVENOUS | Status: AC
  Administered 2024-01-13: 1000 mg via INTRAVENOUS
  Filled 2024-01-13: qty 40

## 2024-01-13 MED ORDER — PEMBROLIZUMAB CHEMO INJECTION 100 MG/4ML
200.0000 mg | Freq: Once | INTRAVENOUS | Status: AC
  Administered 2024-01-13: 200 mg via INTRAVENOUS
  Filled 2024-01-13: qty 200

## 2024-01-13 NOTE — Progress Notes (Signed)
 [facility] [phone]   Fax:(336) [phone]  OFFICE PROGRESS NOTE  Bradley Masters, FNP [address] [place] [address]  DIAGNOSIS: Stage IV (T1b, N3, M1C) non-small cell lung cancer, favoring adenocarcinoma presented with right upper lobe lung nodule in addition to right hilar, subcarinal and bilateral mediastinal as well as supraclavicular lymphadenopathy in addition to bone and brain metastasis diagnosed in June 2022.     PD-L1 expression 80%.     Molecular Studies:  Biomarker Findings Microsatellite status - MS-Stable Tumor Mutational Burden - 6 Muts/Mb Genomic Findings For a complete list of the genes assayed, please refer to the Appendix. KRAS G12C, amplification ATM S470* CCND1 amplification - equivocal? HGF amplification - equivocal? MYC amplification - equivocal? FGF19 amplification - equivocal? FGF3 amplification - equivocal? FGF4 amplification - equivocal? NFKBIA amplification NKX2-1 amplification RAD21 amplification - equivocal? [id number]*26 TERT promoter -124C>T TP53 rearrangement exon 9 7 Disease relevant genes with no reportable alterations: ALK, BRAF, EGFR, ERBB2, MET, RET, ROS1   PRIOR THERAPY: SRS to the metastatic brain lesions under the care of Dr. Kathrynn Running.  Last treatment on 05/04/2021.   CURRENT THERAPY: Palliative systemic chemotherapy with carboplatin for an AUC 5, Alimta 500 mg/m2 and, Keytruda 200 mg IV every 3 weeks.  First dose on 05/08/2021.  Status post 45 cycles.  Starting from cycle #5 he is on maintenance treatment with Alimta and Keytruda every 3 weeks.  INTERVAL HISTORY: Bradley Goodman 68 y.o. male returns to the clinic today for follow-up visit accompanied by his wife. Discussed the use of AI scribe software for clinical note transcription with the patient, who gave verbal consent to proceed.  History of Present Illness   He is a 68 year old with adenocarcinoma and KRAS mutation who presents  for cycle number forty-six of maintenance chemotherapy. He is accompanied by his wife.  He has a history of adenocarcinoma of the lung with a KRAS G12C mutation diagnosed in 2022. Initially, he underwent palliative chemotherapy with carboplatin, Alimta, and Keytruda for four cycles. Since then, he has been on maintenance therapy with Alimta and Keytruda every three weeks. He is currently on cycle number forty-six of this regimen.  No new symptoms or changes in health status over the past three weeks. No chest pain, hemoptysis, nausea, vomiting, diarrhea, or recent weight loss. He mentions allergies, particularly to pollens, but states he has medication to manage this.  He is actively involved in gardening, which is a significant part of his daily activities. He and his wife noted some damage to their garden fence recently, indicating his engagement in outdoor activities.       MEDICAL HISTORY: Past Medical History:  Diagnosis Date   Cancer [facility])    Cervical spondylolysis    Essential hypertension    GERD (gastroesophageal reflux disease)    History of kidney stones    History of migraine    Hyperlipidemia    Hypertension    PONV (postoperative nausea and vomiting)    Type 2 diabetes mellitus (HCC)     ALLERGIES:  has no known allergies.  MEDICATIONS:  Current Outpatient Medications  Medication Sig Dispense Refill   chlorproMAZINE (THORAZINE) 10 MG tablet Take 1 tablet (10 mg total) by mouth 3 (three) times daily as needed for hiccoughs. 90 tablet 0   cholecalciferol (VITAMIN D) 1000 UNITS tablet Take 1,000 Units by mouth every evening.     Continuous Glucose Sensor (FREESTYLE LIBRE 3 SENSOR) MISC Place 1  sensor on the skin every 14 days. Use to check glucose continuously 6 each 3   dexamethasone (DECADRON) 1 MG tablet Take 1 tablet (1 mg total) by mouth daily with breakfast. 90 tablet 1   docusate sodium (COLACE) 100 MG capsule Take 100 mg by mouth daily.     folic acid (FOLVITE) 1  MG tablet Take 1 tablet (1 mg total) by mouth daily. 90 tablet 0   Krill Oil 300 MG CAPS Take 300 mg by mouth every evening.     levothyroxine (SYNTHROID) 88 MCG tablet Take 1 tablet (88 mcg total) by mouth daily before breakfast. 90 tablet 1   loratadine (CLARITIN) 10 MG tablet Take 10 mg by mouth every evening.      losartan (COZAAR) 50 MG tablet Take 1.5 tablets (75 mg total) by mouth daily. 135 tablet 0   mirtazapine (REMERON) 15 MG tablet Take 1 tablet (15 mg total) by mouth at bedtime. 90 tablet 0   Multiple Vitamins-Minerals (MULTIVITAMIN GUMMIES ADULT PO) Take 2 each by mouth daily.     pantoprazole (PROTONIX) 40 MG tablet Take 1 tablet (40 mg total) by mouth every evening. 90 tablet 1   PRESCRIPTION MEDICATION Testosterone injection     rosuvastatin (CRESTOR) 20 MG tablet Take 1 tablet (20 mg total) by mouth daily. 90 tablet 1   SYRINGE-NEEDLE, DISP, 3 ML (B-D 3CC LUER-LOK SYR 21GX1-1/2) 21G X 1-1/2" 3 ML MISC Use to inject testosterone every week 100 each 2   testosterone cypionate (DEPOTESTOTERONE CYPIONATE) 100 MG/ML injection Take 50mg  ( 0.41ml) and 100mg  ( 1ml) into muscle alternatively weekly. 10 mL 1   venlafaxine XR (EFFEXOR-XR) 75 MG 24 hr capsule Take 2 capsules (150 mg total) by mouth daily with breakfast. 180 capsule 2   No current facility-administered medications for this visit.    SURGICAL HISTORY:  Past Surgical History:  Procedure Laterality Date   Bilateral inguinal hernia repair     BRONCHIAL NEEDLE ASPIRATION BIOPSY  04/05/2021   Procedure: BRONCHIAL NEEDLE ASPIRATION BIOPSIES;  Surgeon: Josephine Igo, DO;  [facility];  Service: Pulmonary;;   COLONOSCOPY  01/23/2012   Procedure: COLONOSCOPY;  Surgeon: Corbin Ade, MD;  [facility];  Service: Endoscopy;  Laterality: N/A;  9:30 AM   COLONOSCOPY N/A 10/11/2015   Procedure: COLONOSCOPY;  Surgeon: Corbin Ade, MD;  [facility];  Service: Endoscopy;  Laterality: N/A;  830    COLONOSCOPY N/A 10/14/2019   Procedure: COLONOSCOPY;  Surgeon: Corbin Ade, MD;  [facility];  Service: Endoscopy;  Laterality: N/A;  1:45   POLYPECTOMY  10/14/2019   Procedure: POLYPECTOMY;  Surgeon: Corbin Ade, MD;  [facility];  Service: Endoscopy;;  ascending colon, descending colon   VIDEO BRONCHOSCOPY WITH ENDOBRONCHIAL ULTRASOUND N/A 04/05/2021   Procedure: VIDEO BRONCHOSCOPY WITH ENDOBRONCHIAL ULTRASOUND;  Surgeon: Josephine Igo, DO;  [facility];  Service: Pulmonary;  Laterality: N/A;    REVIEW OF SYSTEMS:  A comprehensive review of systems was negative.   PHYSICAL EXAMINATION: General appearance: alert, cooperative, appears stated age, and no distress Head: Normocephalic, without obvious abnormality, atraumatic Neck: no adenopathy, no JVD, supple, symmetrical, trachea midline, and thyroid not enlarged, symmetric, no tenderness/mass/nodules Lymph nodes: Cervical, supraclavicular, and axillary nodes normal. Resp: clear to auscultation bilaterally Back: symmetric, no curvature. ROM normal. No CVA tenderness. Cardio: regular rate and rhythm, S1, S2 normal, no murmur, click, rub or gallop GI: soft, non-tender; bowel sounds normal;  no masses,  no organomegaly Extremities: extremities normal, atraumatic, no cyanosis or edema  ECOG PERFORMANCE STATUS: 1 - Symptomatic but completely ambulatory  Blood pressure (!) 154/82, pulse 88, temperature 97.6 F (36.4 C), temperature source Temporal, resp. rate 17, height 5\' 11"  (1.803 m), weight 180 lb 3.2 oz (81.7 kg), SpO2 98%.  LABORATORY DATA: Lab Results  Component Value Date   WBC 11.6 (H) 01/13/2024   HGB 11.2 (L) 01/13/2024   HCT 37.0 (L) 01/13/2024   MCV 83.1 01/13/2024   PLT 398 01/13/2024      Chemistry      Component Value Date/Time   NA 142 12/23/2023 1239   NA 144 11/26/2023 1135   K 3.8 12/23/2023 1239   CL 106 12/23/2023 1239   CO2 29 12/23/2023 1239   BUN 14 12/23/2023 1239    BUN 11 11/26/2023 1135   CREATININE 1.03 12/23/2023 1239      Component Value Date/Time   CALCIUM 9.4 12/23/2023 1239   ALKPHOS 68 12/23/2023 1239   AST 22 12/23/2023 1239   ALT 23 12/23/2023 1239   BILITOT 0.2 12/23/2023 1239       RADIOGRAPHIC STUDIES: No results found.   ASSESSMENT AND PLAN: This is a very pleasant 68 years old white male recently diagnosed with stage IV (T1b, N3, M1 C) non-small cell lung cancer favoring adenocarcinoma presented with right upper lobe lung nodule in addition to right hilar, subcarinal and bilateral mediastinal as well as supraclavicular lymphadenopathy.  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  His PD-L1 expression is 80% and his molecular studies showed KRAS G12C mutation. The patient underwent SRS to metastatic brain lesion under the care of Dr. Kathrynn Running and he is currently undergoing systemic chemotherapy with carboplatin for AUC of 5, Alimta 500 Mg/M2 and Keytruda 200 Mg IV every 3 weeks status post 45 cycles.  Starting from cycle #5 the patient will be treated with maintenance treatment with Alimta and Keytruda every 3 weeks.  The patient continues to tolerate this treatment fairly well.    Metastatic adenocarcinoma with KRAS mutation Metastatic adenocarcinoma with KRAS G12C mutation diagnosed in 2022. Currently receiving maintenance therapy with Alimta and Keytruda every three weeks, now on cycle 46. No new complaints or changes in health status. Labs are acceptable for treatment. Previous scan showed abnormalities requiring re-evaluation. Enrolled in [place] assistance program. - Administer cycle 46 of Alimta and Keytruda. - Order a scan 7-10 days before the next visit to evaluate previous abnormalities. - Ensure the scan is read by the radiologist before the next visit. - Check with the financial department regarding Keytruda assistance program.   He was advised to call immediately if he has any concerning  symptoms in the interval. The patient voices understanding of current disease status and treatment options and is in agreement with the current care plan.  All questions were answered. The patient knows to call the clinic with any problems, questions or concerns. We can certainly see the patient much sooner if necessary. The total time spent in the appointment was 20 minutes.  Disclaimer: This note was dictated with voice recognition software. Similar sounding words can inadvertently be transcribed and may not be corrected upon review.

## 2024-01-14 ENCOUNTER — Encounter: Payer: Self-pay | Admitting: Family Medicine

## 2024-01-14 ENCOUNTER — Other Ambulatory Visit (HOSPITAL_COMMUNITY): Payer: Self-pay

## 2024-01-14 ENCOUNTER — Ambulatory Visit (INDEPENDENT_AMBULATORY_CARE_PROVIDER_SITE_OTHER): Payer: Commercial Managed Care - PPO | Admitting: Family Medicine

## 2024-01-14 VITALS — BP 133/87 | HR 90 | Temp 97.5°F | Ht 71.0 in | Wt 183.4 lb

## 2024-01-14 DIAGNOSIS — R630 Anorexia: Secondary | ICD-10-CM | POA: Diagnosis not present

## 2024-01-14 DIAGNOSIS — E1169 Type 2 diabetes mellitus with other specified complication: Secondary | ICD-10-CM

## 2024-01-14 DIAGNOSIS — G479 Sleep disorder, unspecified: Secondary | ICD-10-CM | POA: Diagnosis not present

## 2024-01-14 DIAGNOSIS — C7931 Secondary malignant neoplasm of brain: Secondary | ICD-10-CM

## 2024-01-14 DIAGNOSIS — F339 Major depressive disorder, recurrent, unspecified: Secondary | ICD-10-CM | POA: Diagnosis not present

## 2024-01-14 DIAGNOSIS — F3340 Major depressive disorder, recurrent, in remission, unspecified: Secondary | ICD-10-CM

## 2024-01-14 DIAGNOSIS — E119 Type 2 diabetes mellitus without complications: Secondary | ICD-10-CM | POA: Insufficient documentation

## 2024-01-14 LAB — BAYER DCA HB A1C WAIVED: HB A1C (BAYER DCA - WAIVED): 6 % — ABNORMAL HIGH (ref 4.8–5.6)

## 2024-01-14 MED ORDER — MIRTAZAPINE 15 MG PO TABS
15.0000 mg | ORAL_TABLET | Freq: Every day | ORAL | 1 refills | Status: DC
  Filled 2024-01-14: qty 90, 90d supply, fill #0

## 2024-01-14 NOTE — Patient Instructions (Addendum)

## 2024-01-14 NOTE — Progress Notes (Signed)
 Subjective:  Patient ID: Bradley Goodman, male    DOB: Oct 18, 1955, 68 y.o.   MRN: [id number]  Patient Care Team: [name], FNP as PCP - General (Family Medicine) Jena Gauss Gerrit Friends, MD as Consulting Physician (Gastroenterology) Delora Fuel, OD (Optometry)   Chief Complaint:  Diabetes (3 month follow up )   HPI: Bradley Goodman is a 68 y.o. male presenting on 01/14/2024 for Diabetes (3 month follow up )   Discussed the use of AI scribe software for clinical note transcription with the patient, who gave verbal consent to proceed.  History of Present Illness   Bradley Goodman is a 68 year old male undergoing chemotherapy who presents for follow-up.  He is currently undergoing chemotherapy and notes that his recent treatment session took longer than usual. He experiences fatigue for a day or two following treatment but no significant side effects such as headaches.  He has a history of diabetes, which has been slightly elevated recently due to dietary choices, particularly a preference for sweets. His eating habits have not been ideal, contributing to fluctuations in his blood sugar levels.  He is taking mirtazapine, which effectively manages his appetite. He is also on Effexor at a dosage of 150 mg daily and reports no issues with sleep or other side effects. A previous issue with prescription refills due to a mix-up post-hospital discharge has been resolved.  He previously used Thorazine for hiccups related to a blockage, which has since been resolved. He no longer experiences hiccups.  He maintains a regular sleep schedule, waking up early due to an internal clock, a habit he shares with his father. He generally feels well-rested and not overly fatigued during the day.          Relevant past medical, surgical, family, and social history reviewed and updated as indicated.  Allergies and medications reviewed and updated. Data reviewed: Chart in Epic.   Past Medical History:   Diagnosis Date   Cancer (HCC)    Cervical spondylolysis    Essential hypertension    GERD (gastroesophageal reflux disease)    History of kidney stones    History of migraine    Hyperlipidemia    Hypertension    PONV (postoperative nausea and vomiting)    Type 2 diabetes mellitus (HCC)     Past Surgical History:  Procedure Laterality Date   Bilateral inguinal hernia repair     BRONCHIAL NEEDLE ASPIRATION BIOPSY  04/05/2021   Procedure: BRONCHIAL NEEDLE ASPIRATION BIOPSIES;  Surgeon: Josephine Igo, DO;  Location: [facility];  Service: Pulmonary;;   COLONOSCOPY  01/23/2012   Procedure: COLONOSCOPY;  Surgeon: Corbin Ade, MD;  Location: [facility];  Service: Endoscopy;  Laterality: N/A;  9:30 AM   COLONOSCOPY N/A 10/11/2015   Procedure: COLONOSCOPY;  Surgeon: Corbin Ade, MD;  Location: [facility];  Service: Endoscopy;  Laterality: N/A;  830   COLONOSCOPY N/A 10/14/2019   Procedure: COLONOSCOPY;  Surgeon: Corbin Ade, MD;  Location: [facility];  Service: Endoscopy;  Laterality: N/A;  1:45   POLYPECTOMY  10/14/2019   Procedure: POLYPECTOMY;  Surgeon: Corbin Ade, MD;  Location: [facility];  Service: Endoscopy;;  ascending colon, descending colon   VIDEO BRONCHOSCOPY WITH ENDOBRONCHIAL ULTRASOUND N/A 04/05/2021   Procedure: VIDEO BRONCHOSCOPY WITH ENDOBRONCHIAL ULTRASOUND;  Surgeon: Josephine Igo, DO;  Location: [facility];  Service: Pulmonary;  Laterality: N/A;    Social History   Socioeconomic History  Marital status: Married    Spouse name: Not on file   Number of children: Not on file   Years of education: Not on file   Highest education level: Bachelor's degree (e.g., BA, AB, BS)  Occupational History   Not on file  Tobacco Use   Smoking status: Every Day    Current packs/day: 0.50    Average packs/day: 0.5 packs/day for 50.2 years (25.1 ttl pk-yrs)    Types: Cigarettes    Start date: 10/30/1973   Smokeless tobacco: Never  Vaping Use    Vaping status: Never Used  Substance and Sexual Activity   Alcohol use: Not Currently    Comment: One drink every 6 months.   Drug use: No   Sexual activity: Yes  Other Topics Concern   Not on file  Social History Narrative   Not on file   Social Drivers of Health   Financial Resource Strain: Patient Declined (10/14/2023)   Overall Financial Resource Strain (CARDIA)    Difficulty of Paying Living Expenses: Patient declined  Food Insecurity: No Food Insecurity (11/18/2023)   Hunger Vital Sign    Worried About Running Out of Food in the Last Year: Never true    Ran Out of Food in the Last Year: Never true  Transportation Needs: No Transportation Needs (11/18/2023)   PRAPARE - Administrator, Civil Service (Medical): No    Lack of Transportation (Non-Medical): No  Physical Activity: Unknown (10/14/2023)   Exercise Vital Sign    Days of Exercise per Week: 1 day    Minutes of Exercise per Session: Patient declined  Stress: No Stress Concern Present (10/14/2023)   [employer] of Occupational Health - Occupational Stress Questionnaire    Feeling of Stress : Not at all  Social Connections: Moderately Isolated (11/18/2023)   Social Connection and Isolation Panel [NHANES]    Frequency of Communication with Friends and Family: More than three times a week    Frequency of Social Gatherings with Friends and Family: Once a week    Attends Religious Services: Never    Database administrator or Organizations: No    Attends Banker Meetings: Never    Marital Status: Married  Catering manager Violence: Not At Risk (11/18/2023)   Humiliation, Afraid, Rape, and Kick questionnaire    Fear of Current or Ex-Partner: No    Emotionally Abused: No    Physically Abused: No    Sexually Abused: No    Outpatient Encounter Medications as of 01/14/2024  Medication Sig   chlorproMAZINE (THORAZINE) 10 MG tablet Take 1 tablet (10 mg total) by mouth 3 (three) times daily as needed for  hiccoughs.   cholecalciferol (VITAMIN D) 1000 UNITS tablet Take 1,000 Units by mouth every evening.   Continuous Glucose Sensor (FREESTYLE LIBRE 3 SENSOR) MISC Place 1 sensor on the skin every 14 days. Use to check glucose continuously   dexamethasone (DECADRON) 1 MG tablet Take 1 tablet (1 mg total) by mouth daily with breakfast.   docusate sodium (COLACE) 100 MG capsule Take 100 mg by mouth daily.   folic acid (FOLVITE) 1 MG tablet Take 1 tablet (1 mg total) by mouth daily.   Krill Oil 300 MG CAPS Take 300 mg by mouth every evening.   levothyroxine (SYNTHROID) 88 MCG tablet Take 1 tablet (88 mcg total) by mouth daily before breakfast.   loratadine (CLARITIN) 10 MG tablet Take 10 mg by mouth every evening.    losartan (COZAAR) 50  MG tablet Take 1.5 tablets (75 mg total) by mouth daily.   Multiple Vitamins-Minerals (MULTIVITAMIN GUMMIES ADULT PO) Take 2 each by mouth daily.   pantoprazole (PROTONIX) 40 MG tablet Take 1 tablet (40 mg total) by mouth every evening.   PRESCRIPTION MEDICATION Testosterone injection   rosuvastatin (CRESTOR) 20 MG tablet Take 1 tablet (20 mg total) by mouth daily.   SYRINGE-NEEDLE, DISP, 3 ML (B-D 3CC LUER-LOK SYR 21GX1-1/2) 21G X 1-1/2" 3 ML MISC Use to inject testosterone every week   testosterone cypionate (DEPOTESTOTERONE CYPIONATE) 100 MG/ML injection Take 50mg  ( 0.3ml) and 100mg  ( 1ml) into muscle alternatively weekly.   venlafaxine XR (EFFEXOR-XR) 75 MG 24 hr capsule Take 2 capsules (150 mg total) by mouth daily with breakfast.   mirtazapine (REMERON) 15 MG tablet Take 1 tablet (15 mg total) by mouth at bedtime.   [DISCONTINUED] mirtazapine (REMERON) 15 MG tablet Take 1 tablet (15 mg total) by mouth at bedtime.   No facility-administered encounter medications on file as of 01/14/2024.    No Known Allergies  Pertinent ROS per HPI, otherwise unremarkable      Objective:  BP 133/87   Pulse 90   Temp (!) 97.5 F (36.4 C)   Ht 5\' 11"  (1.803 m)   Wt 183  lb 6.4 oz (83.2 kg)   SpO2 94%   BMI 25.58 kg/m    Wt Readings from Last 3 Encounters:  01/14/24 183 lb 6.4 oz (83.2 kg)  01/13/24 180 lb 3.2 oz (81.7 kg)  12/23/23 180 lb 4.8 oz (81.8 kg)    Physical Exam Vitals and nursing note reviewed.  Constitutional:      General: He is not in acute distress.    Appearance: He is ill-appearing (chronically). He is not toxic-appearing or diaphoretic.  HENT:     Head: Normocephalic and atraumatic.     Nose: Nose normal.     Mouth/Throat:     Mouth: Mucous membranes are moist.  Eyes:     Conjunctiva/sclera: Conjunctivae normal.     Pupils: Pupils are equal, round, and reactive to light.  Cardiovascular:     Rate and Rhythm: Normal rate and regular rhythm.     Heart sounds: Normal heart sounds.  Abdominal:     General: There is distension (minimal).  Musculoskeletal:     Cervical back: Neck supple.     Right lower leg: No edema.     Left lower leg: No edema.  Skin:    General: Skin is warm and dry.     Capillary Refill: Capillary refill takes less than 2 seconds.  Neurological:     General: No focal deficit present.     Mental Status: He is alert and oriented to person, place, and time.  Psychiatric:        Mood and Affect: Mood normal.        Behavior: Behavior normal.        Thought Content: Thought content normal.        Judgment: Judgment normal.       Results for orders placed or performed in visit on 01/13/24  CMP (Cancer Center only)   Collection Time: 01/13/24  1:27 PM  Result Value Ref Range   Sodium 142 135 - 145 mmol/L   Potassium 4.1 3.5 - 5.1 mmol/L   Chloride 106 98 - 111 mmol/L   CO2 29 22 - 32 mmol/L   Glucose, Bld 102 (H) 70 - 99 mg/dL   BUN 13 8 -  23 mg/dL   Creatinine 4.09 8.11 - 1.24 mg/dL   Calcium 9.2 8.9 - 91.4 mg/dL   Total Protein 7.1 6.5 - 8.1 g/dL   Albumin 3.9 3.5 - 5.0 g/dL   AST 17 15 - 41 U/L   ALT 15 0 - 44 U/L   Alkaline Phosphatase 70 38 - 126 U/L   Total Bilirubin 0.2 0.0 - 1.2  mg/dL   GFR, Estimated >78 >29 mL/min   Anion gap 7 5 - 15  CBC with Differential (Cancer Center Only)   Collection Time: 01/13/24  1:27 PM  Result Value Ref Range   WBC Count 11.6 (H) 4.0 - 10.5 K/uL   RBC 4.45 4.22 - 5.81 MIL/uL   Hemoglobin 11.2 (L) 13.0 - 17.0 g/dL   HCT 56.2 (L) 13.0 - 86.5 %   MCV 83.1 80.0 - 100.0 fL   MCH 25.2 (L) 26.0 - 34.0 pg   MCHC 30.3 30.0 - 36.0 g/dL   RDW 78.4 (H) 69.6 - 29.5 %   Platelet Count 398 150 - 400 K/uL   nRBC 0.0 0.0 - 0.2 %   Neutrophils Relative % 83 %   Neutro Abs 9.7 (H) 1.7 - 7.7 K/uL   Lymphocytes Relative 12 %   Lymphs Abs 1.4 0.7 - 4.0 K/uL   Monocytes Relative 3 %   Monocytes Absolute 0.3 0.1 - 1.0 K/uL   Eosinophils Relative 0 %   Eosinophils Absolute 0.0 0.0 - 0.5 K/uL   Basophils Relative 1 %   Basophils Absolute 0.1 0.0 - 0.1 K/uL   Immature Granulocytes 1 %   Abs Immature Granulocytes 0.10 (H) 0.00 - 0.07 K/uL   *Note: Due to a large number of results and/or encounters for the requested time period, some results have not been displayed. A complete set of results can be found in Results Review.       Pertinent labs & imaging results that were available during my care of the patient were reviewed by me and considered in my medical decision making.  Assessment & Plan:  Lancelot was seen today for diabetes.  Diagnoses and all orders for this visit:  Type 2 diabetes mellitus with other specified complication, without long-term current use of insulin (HCC) -     Bayer DCA Hb A1c Waived  Recurrent major depression in remission (HCC) -     mirtazapine (REMERON) 15 MG tablet; Take 1 tablet (15 mg total) by mouth at bedtime.  Malignant neoplasm metastatic to brain (HCC) -     mirtazapine (REMERON) 15 MG tablet; Take 1 tablet (15 mg total) by mouth at bedtime.  Depression, recurrent (HCC) -     mirtazapine (REMERON) 15 MG tablet; Take 1 tablet (15 mg total) by mouth at bedtime.  Decreased appetite -     mirtazapine  (REMERON) 15 MG tablet; Take 1 tablet (15 mg total) by mouth at bedtime.  Difficulty sleeping -     mirtazapine (REMERON) 15 MG tablet; Take 1 tablet (15 mg total) by mouth at bedtime.     Assessment and Plan    Cancer (undergoing chemotherapy) Undergoing chemotherapy with supportive treatment environment. Experiences fatigue for a day or two post-treatment, but no significant side effects such as headaches.  Diabetes Mellitus Reports slightly elevated blood glucose levels due to dietary indiscretions, specifically a sweet tooth. Aware of dietary impact on glucose control. Marazapine effectively manages appetite. - Review A1c levels.  Depression (managed with Effexor) Effexor is well-tolerated with no issues. Previous  prescription refill issue resolved.  General Health Maintenance Wakes up early due to an internal clock without significant daytime fatigue. Advised to report if early waking becomes bothersome.          Continue all other maintenance medications.  Follow up plan: Return in about 3 months (around 04/14/2024), or if symptoms worsen or fail to improve, for DM, all labs.   Continue healthy lifestyle choices, including diet (rich in fruits, vegetables, and lean proteins, and low in salt and simple carbohydrates) and exercise (at least 30 minutes of moderate physical activity daily).  Educational handout given for DM  The above assessment and management plan was discussed with the patient. The patient verbalized understanding of and has agreed to the management plan. Patient is aware to call the clinic if they develop any new symptoms or if symptoms persist or worsen. Patient is aware when to return to the clinic for a follow-up visit. Patient educated on when it is appropriate to go to the emergency department.   Kari Baars, FNP-C [facility] [phone]

## 2024-01-17 ENCOUNTER — Other Ambulatory Visit: Payer: Self-pay | Admitting: Family Medicine

## 2024-01-17 ENCOUNTER — Encounter: Payer: Self-pay | Admitting: Family Medicine

## 2024-01-17 DIAGNOSIS — J40 Bronchitis, not specified as acute or chronic: Secondary | ICD-10-CM

## 2024-01-17 MED ORDER — AZITHROMYCIN 250 MG PO TABS
ORAL_TABLET | ORAL | 0 refills | Status: DC
Start: 2024-01-17 — End: 2024-01-24

## 2024-01-18 ENCOUNTER — Emergency Department (HOSPITAL_COMMUNITY)
Admission: EM | Admit: 2024-01-18 | Discharge: 2024-01-18 | Disposition: A | Discharge status: Home / Self Care | Attending: Emergency Medicine | Admitting: Emergency Medicine

## 2024-01-18 ENCOUNTER — Other Ambulatory Visit: Payer: Self-pay

## 2024-01-18 ENCOUNTER — Emergency Department (HOSPITAL_COMMUNITY)

## 2024-01-18 ENCOUNTER — Encounter (HOSPITAL_COMMUNITY): Payer: Self-pay

## 2024-01-18 DIAGNOSIS — Z5112 Encounter for antineoplastic immunotherapy: Secondary | ICD-10-CM | POA: Diagnosis not present

## 2024-01-18 DIAGNOSIS — C7931 Secondary malignant neoplasm of brain: Secondary | ICD-10-CM | POA: Diagnosis not present

## 2024-01-18 DIAGNOSIS — K7689 Other specified diseases of liver: Secondary | ICD-10-CM | POA: Diagnosis not present

## 2024-01-18 DIAGNOSIS — D649 Anemia, unspecified: Secondary | ICD-10-CM | POA: Diagnosis not present

## 2024-01-18 DIAGNOSIS — C7951 Secondary malignant neoplasm of bone: Secondary | ICD-10-CM | POA: Diagnosis not present

## 2024-01-18 DIAGNOSIS — R1084 Generalized abdominal pain: Secondary | ICD-10-CM

## 2024-01-18 DIAGNOSIS — N200 Calculus of kidney: Secondary | ICD-10-CM | POA: Diagnosis not present

## 2024-01-18 DIAGNOSIS — R140 Abdominal distension (gaseous): Secondary | ICD-10-CM | POA: Diagnosis not present

## 2024-01-18 DIAGNOSIS — Z79899 Other long term (current) drug therapy: Secondary | ICD-10-CM | POA: Insufficient documentation

## 2024-01-18 DIAGNOSIS — N3289 Other specified disorders of bladder: Secondary | ICD-10-CM | POA: Diagnosis not present

## 2024-01-18 DIAGNOSIS — C3411 Malignant neoplasm of upper lobe, right bronchus or lung: Secondary | ICD-10-CM | POA: Diagnosis not present

## 2024-01-18 DIAGNOSIS — R1031 Right lower quadrant pain: Secondary | ICD-10-CM | POA: Diagnosis not present

## 2024-01-18 DIAGNOSIS — Z5111 Encounter for antineoplastic chemotherapy: Secondary | ICD-10-CM | POA: Diagnosis not present

## 2024-01-18 LAB — LIPASE, BLOOD: Lipase: 30 U/L (ref 11–51)

## 2024-01-18 LAB — COMPREHENSIVE METABOLIC PANEL WITH GFR
ALT: 22 U/L (ref 0–44)
AST: 27 U/L (ref 15–41)
Albumin: 3 g/dL — ABNORMAL LOW (ref 3.5–5.0)
Alkaline Phosphatase: 68 U/L (ref 38–126)
Anion gap: 13 (ref 5–15)
BUN: 16 mg/dL (ref 8–23)
CO2: 24 mmol/L (ref 22–32)
Calcium: 8.8 mg/dL — ABNORMAL LOW (ref 8.9–10.3)
Chloride: 100 mmol/L (ref 98–111)
Creatinine, Ser: 1.09 mg/dL (ref 0.61–1.24)
GFR, Estimated: >60 mL/min (ref >60)
Glucose, Bld: 133 mg/dL — ABNORMAL HIGH (ref 70–99)
Potassium: 3.6 mmol/L (ref 3.5–5.1)
Sodium: 137 mmol/L (ref 135–145)
Total Bilirubin: 0.7 mg/dL (ref 0.0–1.2)
Total Protein: 7.2 g/dL (ref 6.5–8.1)

## 2024-01-18 LAB — CBC WITH DIFFERENTIAL/PLATELET
Abs Immature Granulocytes: 0.03 10*3/uL (ref 0.00–0.07)
Basophils Absolute: 0.1 10*3/uL (ref 0.0–0.1)
Basophils Relative: 1 %
Eosinophils Absolute: 0.1 10*3/uL (ref 0.0–0.5)
Eosinophils Relative: 1 %
HCT: 35.5 % — ABNORMAL LOW (ref 39.0–52.0)
Hemoglobin: 10.2 g/dL — ABNORMAL LOW (ref 13.0–17.0)
Immature Granulocytes: 0 %
Lymphocytes Relative: 17 %
Lymphs Abs: 1.5 10*3/uL (ref 0.7–4.0)
MCH: 24.8 pg — ABNORMAL LOW (ref 26.0–34.0)
MCHC: 28.7 g/dL — ABNORMAL LOW (ref 30.0–36.0)
MCV: 86.4 fL (ref 80.0–100.0)
Monocytes Absolute: 0.4 10*3/uL (ref 0.1–1.0)
Monocytes Relative: 4 %
Neutro Abs: 7.2 10*3/uL (ref 1.7–7.7)
Neutrophils Relative %: 77 %
Platelets: 300 10*3/uL (ref 150–400)
RBC: 4.11 MIL/uL — ABNORMAL LOW (ref 4.22–5.81)
RDW: 19 % — ABNORMAL HIGH (ref 11.5–15.5)
WBC: 9.3 10*3/uL (ref 4.0–10.5)
nRBC: 0 % (ref 0.0–0.2)

## 2024-01-18 LAB — TROPONIN I (HIGH SENSITIVITY): Troponin I (High Sensitivity): 4 ng/L (ref <18)

## 2024-01-18 MED ORDER — SODIUM CHLORIDE 0.9 % IV BOLUS
1000.0000 mL | Freq: Once | INTRAVENOUS | Status: AC
  Administered 2024-01-18: 1000 mL via INTRAVENOUS

## 2024-01-18 MED ORDER — IOHEXOL 300 MG/ML  SOLN
100.0000 mL | Freq: Once | INTRAMUSCULAR | Status: AC | PRN
  Administered 2024-01-18: 100 mL via INTRAVENOUS

## 2024-01-18 NOTE — Discharge Instructions (Signed)
 Please follow-up closely with your primary care doctor on an outpatient basis.  Please return to emergency department immediately for any new or worsening symptoms to include vomiting, inability to pass gas or move your bowels, worsening abdominal pain or worsening abdominal distention.

## 2024-01-18 NOTE — ED Notes (Signed)
 Pt given urinal and made aware that we need a UA.

## 2024-01-18 NOTE — ED Provider Notes (Signed)
 [facility] Provider Note   CSN: [id number] Arrival date & time: 01/18/24  2013     History  Chief Complaint  Patient presents with   Bloated    [facility] is a 68 y.o. male.  Patient is a 68 year old male who presents to the emergency department with a chief complaint of abdominal bloating.  Patient does have a history of small bowel obstruction and is concerned he may be developing another one.  He notes that he has had no associated nausea, vomiting.  He has continued to move his bowels and is passing gas.  He denies any associated chest pain or shortness of breath.  He notes that he is experiencing a cough and congestion but notes that he has already been evaluated by his PCP for this and is on antibiotics at this time.  He denies any worsening symptoms in this regard.  He denies any dizziness, lightheadedness or syncope.        Home Medications Prior to Admission medications   Medication Sig Start Date End Date Taking? Authorizing Provider  azithromycin (ZITHROMAX Z-PAK) 250 MG tablet As directed 01/17/24   [name], FNP  chlorproMAZINE (THORAZINE) 10 MG tablet Take 1 tablet (10 mg total) by mouth 3 (three) times daily as needed for hiccoughs. 11/27/23   [name], FNP  cholecalciferol (VITAMIN D) 1000 UNITS tablet Take 1,000 Units by mouth every evening.    [provider]  Continuous Glucose Sensor (FREESTYLE LIBRE 3 SENSOR) MISC Place 1 sensor on the skin every 14 days. Use to check glucose continuously 03/18/23   [name], Doralee Albino, FNP  dexamethasone (DECADRON) 1 MG tablet Take 1 tablet (1 mg total) by mouth daily with breakfast. 04/16/23   [name], Doralee Albino, FNP  docusate sodium (COLACE) 100 MG capsule Take 100 mg by mouth daily.    [provider]  folic acid (FOLVITE) 1 MG tablet Take 1 tablet (1 mg total) by mouth daily. 12/13/23   [name], FNP  Krill Oil 300 MG CAPS Take 300 mg by mouth every  evening.    [provider]  levothyroxine (SYNTHROID) 88 MCG tablet Take 1 tablet (88 mcg total) by mouth daily before breakfast. 11/26/23   Nida, Denman George, MD  loratadine (CLARITIN) 10 MG tablet Take 10 mg by mouth every evening.     [provider]  losartan (COZAAR) 50 MG tablet Take 1.5 tablets (75 mg total) by mouth daily. 12/12/23   [name], FNP  mirtazapine (REMERON) 15 MG tablet Take 1 tablet (15 mg total) by mouth at bedtime. 01/14/24   [name], FNP  Multiple Vitamins-Minerals (MULTIVITAMIN GUMMIES ADULT PO) Take 2 each by mouth daily.    [provider]  pantoprazole (PROTONIX) 40 MG tablet Take 1 tablet (40 mg total) by mouth every evening. 10/24/23   [name], FNP  PRESCRIPTION MEDICATION Testosterone injection    [provider]  rosuvastatin (CRESTOR) 20 MG tablet Take 1 tablet (20 mg total) by mouth daily. 10/24/23   [name], Doralee Albino, FNP  SYRINGE-NEEDLE, DISP, 3 ML (B-D 3CC LUER-LOK SYR 21GX1-1/2) 21G X 1-1/2" 3 ML MISC Use to inject testosterone every week 09/02/23   Roma Kayser, MD  testosterone cypionate (DEPOTESTOTERONE CYPIONATE) 100 MG/ML injection Take 50mg  ( 0.35ml) and 100mg  ( 1ml) into muscle alternatively weekly. 05/07/23   Roma Kayser, MD  venlafaxine XR (EFFEXOR-XR) 75 MG 24 hr  capsule Take 2 capsules (150 mg total) by mouth daily with breakfast. 12/24/23   [name], Georgeann Kindred, FNP      Allergies    Patient has no known allergies.    Review of Systems   Review of Systems  Gastrointestinal:  Positive for abdominal distention.  All other systems reviewed and are negative.   Physical Exam Updated Vital Signs BP 119/82 (BP Location: Left Arm)   Pulse (!) 122   Temp 99.8 F (37.7 C) (Oral)   Resp 19   Ht 5\' 11"  (1.803 m)   Wt 81.6 kg   SpO2 95%   BMI 25.10 kg/m  Physical Exam Vitals and nursing note reviewed.  Constitutional:      Appearance: Normal appearance.  HENT:     Head:  Normocephalic and atraumatic.     Nose: Nose normal.     Mouth/Throat:     Mouth: Mucous membranes are moist.  Eyes:     Extraocular Movements: Extraocular movements intact.     Conjunctiva/sclera: Conjunctivae normal.     Pupils: Pupils are equal, round, and reactive to light.  Cardiovascular:     Rate and Rhythm: Normal rate and regular rhythm.     Pulses: Normal pulses.     Heart sounds: Normal heart sounds. No murmur heard.    No gallop.  Pulmonary:     Effort: Pulmonary effort is normal. No respiratory distress.     Breath sounds: Normal breath sounds. No stridor. No wheezing, rhonchi or rales.  Abdominal:     General: Abdomen is flat. Bowel sounds are normal.     Palpations: Abdomen is soft.     Comments: Mild abdominal distention, no tenderness palpation throughout, no guarding, no masses  Musculoskeletal:        General: Normal range of motion.     Cervical back: Normal range of motion and neck supple.     Right lower leg: No edema.     Left lower leg: No edema.  Skin:    General: Skin is warm and dry.  Neurological:     General: No focal deficit present.     Mental Status: He is alert and oriented to person, place, and time. Mental status is at baseline.  Psychiatric:        Mood and Affect: Mood normal.        Behavior: Behavior normal.        Thought Content: Thought content normal.        Judgment: Judgment normal.     ED Results / Procedures / Treatments   Labs (all labs ordered are listed, but only abnormal results are displayed) Labs Reviewed  COMPREHENSIVE METABOLIC PANEL WITH GFR - Abnormal; Notable for the following components:      Result Value   Glucose, Bld 133 (*)    Calcium 8.8 (*)    Albumin 3.0 (*)    All other components within normal limits  CBC WITH DIFFERENTIAL/PLATELET - Abnormal; Notable for the following components:   RBC 4.11 (*)    Hemoglobin 10.2 (*)    HCT 35.5 (*)    MCH 24.8 (*)    MCHC 28.7 (*)    RDW 19.0 (*)    All other  components within normal limits  LIPASE, BLOOD  URINALYSIS, ROUTINE W REFLEX MICROSCOPIC  TROPONIN I (HIGH SENSITIVITY)  TROPONIN I (HIGH SENSITIVITY)    EKG None  Radiology CT ABDOMEN PELVIS W CONTRAST Result Date: 01/18/2024 CLINICAL DATA:  Right lower quadrant  pain EXAM: CT ABDOMEN AND PELVIS WITH CONTRAST TECHNIQUE: Multidetector CT imaging of the abdomen and pelvis was performed using the standard protocol following bolus administration of intravenous contrast. RADIATION DOSE REDUCTION: This exam was performed according to the departmental dose-optimization program which includes automated exposure control, adjustment of the mA and/or kV according to patient size and/or use of iterative reconstruction technique. CONTRAST:  OMNIPAQUE IOHEXOL 300 MG/ML  SOLN COMPARISON:  11/18/2023 FINDINGS: Lower chest: No acute abnormality. Hepatobiliary: No focal liver abnormality is seen. No gallstones, gallbladder wall thickening, or biliary dilatation. Stable small hepatic cyst is noted in the right lobe. Pancreas: Unremarkable. No pancreatic ductal dilatation or surrounding inflammatory changes. Spleen: Normal in size without focal abnormality. Adrenals/Urinary Tract: Adrenal glands are within normal limits. Kidneys demonstrate a normal enhancement pattern. Nonobstructing upper pole left renal calculus is noted and stable measuring up to 9 mm. No obstructive changes are seen. The bladder is partially distended. Stomach/Bowel: Appendix is within normal limits. No obstructive or inflammatory changes of colon are noted. Small bowel and stomach are within normal limits. Previously seen small bowel dilatation has resolved. Vascular/Lymphatic: Aortic atherosclerosis. No enlarged abdominal or pelvic lymph nodes. Reproductive: Prostate is unremarkable. Other: No abdominal wall hernia or abnormality. No abdominopelvic ascites. Musculoskeletal: No acute or significant osseous findings. IMPRESSION: Nonobstructing  left renal stone is again seen and stable. Resolution of previously seen small bowel obstruction. No findings to correspond with the patient's given clinical history are seen. Electronically Signed   By: Violeta Grey M.D.   On: 01/18/2024 22:08    Procedures Procedures    Medications Ordered in ED Medications  sodium chloride 0.9 % bolus 1,000 mL (1,000 mLs Intravenous New Bag/Given 01/18/24 2048)  iohexol (OMNIPAQUE) 300 MG/ML solution 100 mL (100 mLs Intravenous Contrast Given 01/18/24 2141)    ED Course/ Medical Decision Making/ A&P                                 Medical Decision Making Amount and/or Complexity of Data Reviewed Labs: ordered. Radiology: ordered.  Risk Prescription drug management.   This patient presents to the ED for concern of abdominal distention differential diagnosis includes small bowel obstruction, appendicitis, cholecystitis, diverticulitis, pyelonephritis, kidney stone, mesenteric ischemia, pancreatitis    Additional history obtained:  Additional history obtained from medical records External records from outside source obtained and reviewed including none   Lab Tests:  I Ordered, and personally interpreted labs.  The pertinent results include: Anemia, no leukocytosis, normal troponin, normal lipase, negative electrolytes, normal kidney function liver function   Imaging Studies ordered:  I ordered imaging studies including CT scan of the abdomen and pelvis I independently visualized and interpreted imaging which showed no acute surgical process, no small bowel obstruction I agree with the radiologist interpretation   Medicines ordered and prescription drug management:  I ordered medication including IV fluids for tachycardia Reevaluation of the patient after these medicines showed that the patient improved I have reviewed the patients home medicines and have made adjustments as needed   Problem List / ED Course:  Patient is doing  well at this time.  Discussed with patient that CT scan of the abdomen and pelvis demonstrated no acute surgical process and no indication for small bowel obstruction at this time.  Did discuss patient case with the general surgeon on-call, Dr. Collene Dawson, given the patient's history of small bowel obstruction.  She did agree that  patient may be discharged home with strict return precautions versus admitted to the [facility] for observation and IV fluids.  This plan was discussed with the patient and he was offered admission though he has declined at this point stating they would like to be discharged home and will continue to monitor his symptoms and will return if they worsen.  Patient's tachycardia has greatly improved with IV fluids in the emergency department.  He is already undergoing treatment for his upper respiratory infection and do not suspect any further workup is warranted in this regard.  Blood work has otherwise been unremarkable.  His anemia is stable.  No other acute changes were noted.  Strict return precautions were provided for any new or worsening symptoms and patient notes that he will return immediately for any worsening symptoms.  Patient was fully evaluated by attending physician as well who is in agreement to plan.   Social Determinants of Health:  None           Final Clinical Impression(s) / ED Diagnoses Final diagnoses:  None    Rx / DC Orders ED Discharge Orders     None         Emmalene Hare 01/18/24 2251    Cheyenne Cotta, MD 01/20/24 1003

## 2024-01-18 NOTE — ED Notes (Signed)
 Patient transported to CT

## 2024-01-18 NOTE — ED Triage Notes (Signed)
 Pt arrived via POV c/o RLQ abd pain. 3/10. Pt thinks it may be a sbo. No bowel sounds heard on right side in triage.

## 2024-01-19 ENCOUNTER — Other Ambulatory Visit: Payer: Self-pay | Admitting: Family Medicine

## 2024-01-19 DIAGNOSIS — E2740 Unspecified adrenocortical insufficiency: Secondary | ICD-10-CM

## 2024-01-20 ENCOUNTER — Ambulatory Visit: Payer: Commercial Managed Care - PPO | Admitting: Physician Assistant

## 2024-01-20 ENCOUNTER — Other Ambulatory Visit (HOSPITAL_COMMUNITY): Payer: Self-pay

## 2024-01-20 ENCOUNTER — Ambulatory Visit: Payer: Commercial Managed Care - PPO

## 2024-01-20 ENCOUNTER — Other Ambulatory Visit: Payer: Self-pay

## 2024-01-20 ENCOUNTER — Other Ambulatory Visit: Payer: Commercial Managed Care - PPO

## 2024-01-21 ENCOUNTER — Ambulatory Visit: Payer: Commercial Managed Care - PPO | Admitting: Family Medicine

## 2024-01-21 ENCOUNTER — Other Ambulatory Visit: Payer: Self-pay

## 2024-01-21 MED ORDER — DEXAMETHASONE 1 MG PO TABS
1.0000 mg | ORAL_TABLET | Freq: Every day | ORAL | 1 refills | Status: DC
  Filled 2024-01-21: qty 90, 90d supply, fill #0

## 2024-01-22 ENCOUNTER — Encounter (HOSPITAL_COMMUNITY): Payer: Self-pay | Admitting: Emergency Medicine

## 2024-01-22 ENCOUNTER — Emergency Department (HOSPITAL_COMMUNITY)

## 2024-01-22 ENCOUNTER — Other Ambulatory Visit: Payer: Self-pay

## 2024-01-22 ENCOUNTER — Observation Stay (HOSPITAL_COMMUNITY)
Admission: EM | Admit: 2024-01-22 | Discharge: 2024-01-24 | Disposition: A | Discharge status: Home / Self Care | Attending: Internal Medicine | Admitting: Internal Medicine

## 2024-01-22 DIAGNOSIS — I152 Hypertension secondary to endocrine disorders: Secondary | ICD-10-CM | POA: Diagnosis present

## 2024-01-22 DIAGNOSIS — E1159 Type 2 diabetes mellitus with other circulatory complications: Secondary | ICD-10-CM | POA: Diagnosis present

## 2024-01-22 DIAGNOSIS — F17210 Nicotine dependence, cigarettes, uncomplicated: Secondary | ICD-10-CM | POA: Insufficient documentation

## 2024-01-22 DIAGNOSIS — C3491 Malignant neoplasm of unspecified part of right bronchus or lung: Secondary | ICD-10-CM | POA: Diagnosis not present

## 2024-01-22 DIAGNOSIS — R531 Weakness: Principal | ICD-10-CM

## 2024-01-22 DIAGNOSIS — E8809 Other disorders of plasma-protein metabolism, not elsewhere classified: Secondary | ICD-10-CM | POA: Insufficient documentation

## 2024-01-22 DIAGNOSIS — E119 Type 2 diabetes mellitus without complications: Secondary | ICD-10-CM | POA: Diagnosis not present

## 2024-01-22 DIAGNOSIS — R509 Fever, unspecified: Secondary | ICD-10-CM | POA: Diagnosis not present

## 2024-01-22 DIAGNOSIS — R627 Adult failure to thrive: Secondary | ICD-10-CM | POA: Insufficient documentation

## 2024-01-22 DIAGNOSIS — E782 Mixed hyperlipidemia: Secondary | ICD-10-CM

## 2024-01-22 DIAGNOSIS — Z79899 Other long term (current) drug therapy: Secondary | ICD-10-CM | POA: Diagnosis not present

## 2024-01-22 DIAGNOSIS — K219 Gastro-esophageal reflux disease without esophagitis: Secondary | ICD-10-CM | POA: Diagnosis present

## 2024-01-22 DIAGNOSIS — E860 Dehydration: Secondary | ICD-10-CM | POA: Insufficient documentation

## 2024-01-22 DIAGNOSIS — E1169 Type 2 diabetes mellitus with other specified complication: Secondary | ICD-10-CM | POA: Diagnosis present

## 2024-01-22 DIAGNOSIS — I10 Essential (primary) hypertension: Secondary | ICD-10-CM | POA: Diagnosis present

## 2024-01-22 DIAGNOSIS — E039 Hypothyroidism, unspecified: Secondary | ICD-10-CM | POA: Diagnosis present

## 2024-01-22 DIAGNOSIS — E46 Unspecified protein-calorie malnutrition: Secondary | ICD-10-CM | POA: Diagnosis not present

## 2024-01-22 DIAGNOSIS — Z859 Personal history of malignant neoplasm, unspecified: Secondary | ICD-10-CM | POA: Insufficient documentation

## 2024-01-22 DIAGNOSIS — Z85118 Personal history of other malignant neoplasm of bronchus and lung: Secondary | ICD-10-CM | POA: Insufficient documentation

## 2024-01-22 DIAGNOSIS — R1030 Lower abdominal pain, unspecified: Secondary | ICD-10-CM

## 2024-01-22 DIAGNOSIS — R059 Cough, unspecified: Secondary | ICD-10-CM | POA: Diagnosis not present

## 2024-01-22 LAB — CBC WITH DIFFERENTIAL/PLATELET
Abs Immature Granulocytes: 0.14 10*3/uL — ABNORMAL HIGH (ref 0.00–0.07)
Basophils Absolute: 0 10*3/uL (ref 0.0–0.1)
Basophils Relative: 1 %
Eosinophils Absolute: 0.2 10*3/uL (ref 0.0–0.5)
Eosinophils Relative: 3 %
HCT: 31.9 % — ABNORMAL LOW (ref 39.0–52.0)
Hemoglobin: 9.4 g/dL — ABNORMAL LOW (ref 13.0–17.0)
Immature Granulocytes: 2 %
Lymphocytes Relative: 18 %
Lymphs Abs: 1.4 10*3/uL (ref 0.7–4.0)
MCH: 25.4 pg — ABNORMAL LOW (ref 26.0–34.0)
MCHC: 29.5 g/dL — ABNORMAL LOW (ref 30.0–36.0)
MCV: 86.2 fL (ref 80.0–100.0)
Monocytes Absolute: 0.9 10*3/uL (ref 0.1–1.0)
Monocytes Relative: 11 %
Neutro Abs: 4.9 10*3/uL (ref 1.7–7.7)
Neutrophils Relative %: 65 %
Platelets: 231 10*3/uL (ref 150–400)
RBC: 3.7 MIL/uL — ABNORMAL LOW (ref 4.22–5.81)
RDW: 19 % — ABNORMAL HIGH (ref 11.5–15.5)
WBC: 7.6 10*3/uL (ref 4.0–10.5)
nRBC: 0 % (ref 0.0–0.2)

## 2024-01-22 LAB — URINALYSIS, ROUTINE W REFLEX MICROSCOPIC
Bacteria, UA: NONE SEEN
Bilirubin Urine: NEGATIVE
Glucose, UA: NEGATIVE mg/dL
Hgb urine dipstick: NEGATIVE
Ketones, ur: 20 mg/dL — AB
Leukocytes,Ua: NEGATIVE
Nitrite: NEGATIVE
Protein, ur: 30 mg/dL — AB
Specific Gravity, Urine: 1.02 (ref 1.005–1.030)
pH: 5 (ref 5.0–8.0)

## 2024-01-22 LAB — LIPASE, BLOOD: Lipase: 28 U/L (ref 11–51)

## 2024-01-22 LAB — COMPREHENSIVE METABOLIC PANEL WITH GFR
ALT: 17 U/L (ref 0–44)
AST: 25 U/L (ref 15–41)
Albumin: 2.7 g/dL — ABNORMAL LOW (ref 3.5–5.0)
Alkaline Phosphatase: 76 U/L (ref 38–126)
Anion gap: 14 (ref 5–15)
BUN: 10 mg/dL (ref 8–23)
CO2: 23 mmol/L (ref 22–32)
Calcium: 8.7 mg/dL — ABNORMAL LOW (ref 8.9–10.3)
Chloride: 98 mmol/L (ref 98–111)
Creatinine, Ser: 1.01 mg/dL (ref 0.61–1.24)
GFR, Estimated: >60 mL/min (ref >60)
Glucose, Bld: 86 mg/dL (ref 70–99)
Potassium: 3.7 mmol/L (ref 3.5–5.1)
Sodium: 135 mmol/L (ref 135–145)
Total Bilirubin: 0.3 mg/dL (ref 0.0–1.2)
Total Protein: 7.1 g/dL (ref 6.5–8.1)

## 2024-01-22 LAB — RESP PANEL BY RT-PCR (RSV, FLU A&B, COVID)  RVPGX2
Influenza A by PCR: NEGATIVE
Influenza B by PCR: NEGATIVE
Resp Syncytial Virus by PCR: NEGATIVE
SARS Coronavirus 2 by RT PCR: NEGATIVE

## 2024-01-22 LAB — MAGNESIUM: Magnesium: 1.9 mg/dL (ref 1.7–2.4)

## 2024-01-22 MED ORDER — PNEUMOCOCCAL 20-VAL CONJ VACC 0.5 ML IM SUSY: 0.5000 mL | PREFILLED_SYRINGE | INTRAMUSCULAR | Status: DC

## 2024-01-22 MED ORDER — SODIUM CHLORIDE 0.9 % IV BOLUS
500.0000 mL | Freq: Once | INTRAVENOUS | Status: AC
  Administered 2024-01-22: 500 mL via INTRAVENOUS

## 2024-01-22 MED ORDER — ONDANSETRON HCL 4 MG/2ML IJ SOLN
4.0000 mg | Freq: Four times a day (QID) | INTRAMUSCULAR | Status: DC | PRN
  Administered 2024-01-22 – 2024-01-23 (×2): 4 mg via INTRAVENOUS
  Filled 2024-01-22 (×2): qty 2

## 2024-01-22 MED ORDER — ONDANSETRON HCL 4 MG PO TABS: 4.0000 mg | ORAL_TABLET | Freq: Four times a day (QID) | ORAL | Status: DC | PRN

## 2024-01-22 MED ORDER — LACTATED RINGERS IV BOLUS
1000.0000 mL | Freq: Once | INTRAVENOUS | Status: AC
  Administered 2024-01-22: 1000 mL via INTRAVENOUS

## 2024-01-22 MED ORDER — ENOXAPARIN SODIUM 40 MG/0.4ML IJ SOSY
40.0000 mg | PREFILLED_SYRINGE | INTRAMUSCULAR | Status: DC
  Administered 2024-01-22: 40 mg via SUBCUTANEOUS
  Filled 2024-01-22 (×2): qty 0.4

## 2024-01-22 MED ORDER — ACETAMINOPHEN 650 MG RE SUPP: 650.0000 mg | Freq: Four times a day (QID) | RECTAL | Status: DC | PRN

## 2024-01-22 MED ORDER — LACTATED RINGERS IV SOLN: INTRAVENOUS | Status: AC

## 2024-01-22 MED ORDER — ENSURE ENLIVE PO LIQD: 237.0000 mL | Freq: Two times a day (BID) | ORAL | Status: DC

## 2024-01-22 MED ORDER — ACETAMINOPHEN 325 MG PO TABS
650.0000 mg | ORAL_TABLET | Freq: Four times a day (QID) | ORAL | Status: DC | PRN
  Administered 2024-01-22 – 2024-01-23 (×2): 650 mg via ORAL
  Filled 2024-01-22 (×2): qty 2

## 2024-01-22 NOTE — Progress Notes (Signed)
   01/22/24 2139  TOC Brief Assessment  Insurance and Status Reviewed  Patient has primary care physician Yes  Home environment has been reviewed From home  Prior level of function: Independent  Prior/Current Home Services No current home services  Social Drivers of Health Review SDOH reviewed interventions complete (smoking cessation added to AVS)  Readmission risk has been reviewed Yes  Transition of care needs no transition of care needs at this time   Transition of Care Department [facility]) has reviewed patient and no other TOC needs have been identified at this time. We will continue to monitor patient advancement through interdisciplinary progression rounds. If new patient needs arise, please place a TOC consult.

## 2024-01-22 NOTE — ED Provider Notes (Signed)
 [place] EMERGENCY DEPARTMENT AT [facility] Provider Note   CSN: [id number] Arrival date & time: 01/22/24  1301     History  Chief Complaint  Patient presents with   Weakness    [facility] is a 68 y.o. male.  68 year old male with a history of lung cancer on chemotherapy, hypertension, hyperlipidemia, and diabetes who presents emergency department generalized weakness, fatigue, myalgias, and fever.  Patient reports that over the past week he has been feeling sick says that he initially was having some lower abdominal pain.  Came to the emergency department last Saturday had a CT scan that did not show acute findings.  Says that since going home still been feeling poorly.  Been nauseous.  Intermittently having lower abdominal pain but is not having any right now.  Also had a cough approximately a week ago that has been resolving.  Was treated for this with a Z-Pak.  Wife was also sick.  She is requesting that he have blood cultures drawn since he is immunocompromised.  Does not have any indwelling lines or ports.  Denies any diarrhea, dysuria, frequency, or pain elsewhere.       Home Medications Prior to Admission medications   Medication Sig Start Date End Date Taking? Authorizing Provider  acetaminophen  (TYLENOL ) 500 MG tablet Take 1,000 mg by mouth every 6 (six) hours as needed for mild pain (pain score 1-3) or fever.   Yes [provider]  azithromycin  (ZITHROMAX  Z-PAK) 250 MG tablet As directed 01/17/24  Yes [name], Georgeann Kindred, FNP  chlorproMAZINE  (THORAZINE ) 10 MG tablet Take 1 tablet (10 mg total) by mouth 3 (three) times daily as needed for hiccoughs. 11/27/23  Yes [name], Georgeann Kindred, FNP  cholecalciferol  (VITAMIN D ) 1000 UNITS tablet Take 1,000 Units by mouth every evening.   Yes [provider]  dexamethasone  (DECADRON ) 1 MG tablet Take 1 tablet (1 mg total) by mouth daily with breakfast. 01/21/24  Yes [name], Georgeann Kindred, FNP  docusate sodium  (COLACE) 100  MG capsule Take 100 mg by mouth daily.   Yes [provider]  folic acid  (FOLVITE ) 1 MG tablet Take 1 tablet (1 mg total) by mouth daily. 12/13/23  Yes [name], Georgeann Kindred, FNP  Krill Oil 300 MG CAPS Take 300 mg by mouth every evening.   Yes [provider]  levothyroxine  (SYNTHROID ) 88 MCG tablet Take 1 tablet (88 mcg total) by mouth daily before breakfast. 11/26/23  Yes Nida, Gebreselassie W, MD  loratadine  (CLARITIN ) 10 MG tablet Take 10 mg by mouth every evening.    Yes [provider]  losartan  (COZAAR ) 50 MG tablet Take 1.5 tablets (75 mg total) by mouth daily. 12/12/23  Yes [name], Georgeann Kindred, FNP  mirtazapine  (REMERON ) 15 MG tablet Take 1 tablet (15 mg total) by mouth at bedtime. 01/14/24  Yes [name], Georgeann Kindred, FNP  Multiple Vitamins-Minerals (MULTIVITAMIN GUMMIES ADULT PO) Take 2 each by mouth daily.   Yes [provider]  pantoprazole  (PROTONIX ) 40 MG tablet Take 1 tablet (40 mg total) by mouth every evening. 10/24/23  Yes [name], Georgeann Kindred, FNP  rosuvastatin  (CRESTOR ) 20 MG tablet Take 1 tablet (20 mg total) by mouth daily. 10/24/23  Yes [name], Georgeann Kindred, FNP  testosterone  cypionate (DEPOTESTOTERONE CYPIONATE) 100 MG/ML injection Take 50mg  ( 0.72ml) and 100mg  ( 1ml) into muscle alternatively weekly. 05/07/23  Yes Nida, Gebreselassie W, MD  venlafaxine  XR (EFFEXOR -XR) 75 MG 24 hr capsule Take 2 capsules (150 mg total) by mouth daily  with breakfast. 12/24/23  Yes [name], Georgeann Kindred, FNP  Continuous Glucose Sensor (FREESTYLE LIBRE 3 SENSOR) MISC Place 1 sensor on the skin every 14 days. Use to check glucose continuously 03/18/23   [name], Georgeann Kindred, FNP  PRESCRIPTION MEDICATION Testosterone  injection    [provider]  SYRINGE-NEEDLE, DISP, 3 ML (B-D 3CC LUER-LOK SYR 21GX1-1/2) 21G X 1-1/2" 3 ML MISC Use to inject testosterone  every week 09/02/23   Nida, Gebreselassie W, MD      Allergies    Patient has no known allergies.    Review of Systems   Review of Systems  Physical  Exam Updated Vital Signs BP 116/71 (BP Location: Right Arm)   Pulse 92   Temp 98.1 F (36.7 C)   Resp 18   Ht 5\' 11"  (1.803 m)   Wt 81.4 kg   SpO2 97%   BMI 25.02 kg/m  Physical Exam Vitals and nursing note reviewed.  Constitutional:      General: He is not in acute distress.    Appearance: He is well-developed.  HENT:     Head: Normocephalic and atraumatic.     Right Ear: External ear normal.     Left Ear: External ear normal.     Nose: Nose normal.  Eyes:     Extraocular Movements: Extraocular movements intact.     Conjunctiva/sclera: Conjunctivae normal.     Pupils: Pupils are equal, round, and reactive to light.  Cardiovascular:     Rate and Rhythm: Normal rate and regular rhythm.     Heart sounds: Normal heart sounds.  Pulmonary:     Effort: Pulmonary effort is normal. No respiratory distress.     Breath sounds: Normal breath sounds.  Abdominal:     General: There is no distension.     Palpations: Abdomen is soft. There is no mass.     Tenderness: There is no abdominal tenderness. There is no guarding.  Musculoskeletal:     Cervical back: Normal range of motion and neck supple.  Skin:    General: Skin is warm and dry.  Neurological:     Mental Status: He is alert. Mental status is at baseline.  Psychiatric:        Mood and Affect: Mood normal.        Behavior: Behavior normal.     ED Results / Procedures / Treatments   Labs (all labs ordered are listed, but only abnormal results are displayed) Labs Reviewed  COMPREHENSIVE METABOLIC PANEL WITH GFR - Abnormal; Notable for the following components:      Result Value   Calcium  8.7 (*)    Albumin 2.7 (*)    All other components within normal limits  CBC WITH DIFFERENTIAL/PLATELET - Abnormal; Notable for the following components:   RBC 3.70 (*)    Hemoglobin 9.4 (*)    HCT 31.9 (*)    MCH 25.4 (*)    MCHC 29.5 (*)    RDW 19.0 (*)    Abs Immature Granulocytes 0.14 (*)    All other components within  normal limits  URINALYSIS, ROUTINE W REFLEX MICROSCOPIC - Abnormal; Notable for the following components:   Ketones, ur 20 (*)    Protein, ur 30 (*)    All other components within normal limits  COMPREHENSIVE METABOLIC PANEL WITH GFR - Abnormal; Notable for the following components:   Calcium  8.1 (*)    Total Protein 5.7 (*)    Albumin 2.2 (*)    All other components within  normal limits  CBC - Abnormal; Notable for the following components:   RBC 3.49 (*)    Hemoglobin 8.6 (*)    HCT 29.5 (*)    MCH 24.6 (*)    MCHC 29.2 (*)    RDW 19.0 (*)    All other components within normal limits  CBC - Abnormal; Notable for the following components:   RBC 3.69 (*)    Hemoglobin 9.2 (*)    HCT 31.1 (*)    MCH 24.9 (*)    MCHC 29.6 (*)    RDW 18.6 (*)    All other components within normal limits  COMPREHENSIVE METABOLIC PANEL WITH GFR - Abnormal; Notable for the following components:   Glucose, Bld 106 (*)    BUN 7 (*)    Calcium  8.5 (*)    Total Protein 6.1 (*)    Albumin 2.3 (*)    All other components within normal limits  RESP PANEL BY RT-PCR (RSV, FLU A&B, COVID)  [id number]  CULTURE, BLOOD (ROUTINE X 2)  CULTURE, BLOOD (ROUTINE X 2)  LIPASE, BLOOD  MAGNESIUM   MAGNESIUM   PHOSPHORUS  MAGNESIUM   TROPONIN I (HIGH SENSITIVITY)  TROPONIN I (HIGH SENSITIVITY)    EKG None  Radiology DG Chest 2 View Result Date: 01/22/2024 CLINICAL DATA:  Cough, fever EXAM: CHEST - 2 VIEW COMPARISON:  11/20/2023 FINDINGS: Heart and mediastinal contours are within normal limits. No focal opacities or effusions. No acute bony abnormality. IMPRESSION: No active cardiopulmonary disease. Electronically Signed   By: Janeece Mechanic M.D.   On: 01/22/2024 19:11    Procedures Procedures    Medications Ordered in ED Medications  feeding supplement (ENSURE ENLIVE / ENSURE PLUS) liquid 237 mL (237 mLs Oral Not Given 01/23/24 1450)  lactated ringers  infusion (0 mLs Intravenous Stopped 01/23/24 0549)   pneumococcal 20-valent conjugate vaccine (PREVNAR 20) injection 0.5 mL (0.5 mLs Intramuscular Not Given 01/23/24 0859)  enoxaparin  (LOVENOX ) injection 40 mg (40 mg Subcutaneous Patient Refused/Not Given 01/23/24 2104)  acetaminophen  (TYLENOL ) tablet 650 mg (650 mg Oral Given 01/23/24 1147)    Or  acetaminophen  (TYLENOL ) suppository 650 mg ( Rectal See Alternative 01/23/24 1147)  ondansetron  (ZOFRAN ) tablet 4 mg ( Oral See Alternative 01/23/24 0906)    Or  ondansetron  (ZOFRAN ) injection 4 mg (4 mg Intravenous Given 01/23/24 0906)  losartan  (COZAAR ) tablet 75 mg (75 mg Oral Given 01/23/24 2103)  rosuvastatin  (CRESTOR ) tablet 20 mg (20 mg Oral Given 01/23/24 2104)  dexamethasone  (DECADRON ) tablet 1 mg (1 mg Oral Given 01/23/24 1035)  levothyroxine  (SYNTHROID ) tablet 88 mcg (88 mcg Oral Given 01/24/24 0514)  pantoprazole  (PROTONIX ) EC tablet 40 mg (40 mg Oral Given 01/23/24 1736)  cholecalciferol  (VITAMIN D3) 25 MCG (1000 UNIT) tablet 1,000 Units (1,000 Units Oral Given 01/23/24 1736)  multivitamin with minerals tablet 1 tablet (1 tablet Oral Given 01/23/24 2104)  mirtazapine  (REMERON ) tablet 15 mg (15 mg Oral Given 01/23/24 2104)  folic acid  (FOLVITE ) tablet 1 mg (1 mg Oral Given 01/23/24 1147)  docusate sodium  (COLACE) capsule 100 mg (100 mg Oral Given 01/23/24 1147)  loratadine  (CLARITIN ) tablet 10 mg (10 mg Oral Given 01/23/24 1736)  chlorproMAZINE  (THORAZINE ) tablet 10 mg (has no administration in time range)  sodium chloride  0.9 % bolus 500 mL (0 mLs Intravenous Stopped 01/22/24 1921)  lactated ringers  bolus 1,000 mL (1,000 mLs Intravenous New Bag/Given 01/22/24 1928)    ED Course/ Medical Decision Making/ A&P Clinical Course as of 01/24/24 0718  Wed Jan 22, 2024  1703  Signed out to Dr Goldson [RP]    Clinical Course User Index [RP] Ninetta Basket, MD                                 Medical Decision Making Amount and/or Complexity of Data Reviewed Labs: ordered. Radiology:  ordered.  Risk Decision regarding hospitalization.   [facility] is a 68 y.o. male with comorbidities that complicate the patient evaluation including chemotherapy, hypertension, hyperlipidemia, and diabetes who presents emergency department generalized weakness, fatigue, myalgias, and fever.    Initial Ddx:  URI, pneumonia, UTI, intra-abdominal abscess/infection, bacteremia  MDM/Course:  Patient presents emergency department with generalized fatigue, myalgias, and fevers at home.  Is currently on chemotherapy for his lung cancer.  Reportedly is getting over respiratory illness.  Also had some abdominal pain recently but had a reassuring CT scan and says that he is no longer having any significant abdominal pain.  On exam does not have any obvious explanation for his symptoms.  He is not toxic.  Not septic currently.  Had lab work that was sent including blood cultures.  His CBC and CMP did not show any acute findings.  He had a COVID and flu that was sent that was negative.  Has a chest x-ray and urinalysis that is pending at the time of signout to the oncoming physician.  Upon re-evaluation remained stable.  This patient presents to the ED for concern of complaints listed in HPI, this involves an extensive number of treatment options, and is a complaint that carries with it a high risk of complications and morbidity. Disposition including potential need for admission considered.   Dispo: Pending remainder of workup  Additional history obtained from spouse Records reviewed ED Visit Notes The following labs were independently interpreted: Chemistry and show no acute abnormality I personally reviewed and interpreted cardiac monitoring: sinus tachycardia I personally reviewed and interpreted the pt's EKG: see above for interpretation  I have reviewed the patients home medications and made adjustments as needed Social Determinants of health:  Geriatric  Portions of this note were  generated with Scientist, clinical (histocompatibility and immunogenetics). Dictation errors may occur despite best attempts at proofreading.     Final Clinical Impression(s) / ED Diagnoses Final diagnoses:  Generalized weakness  Fever, unspecified fever cause  History of lung cancer    Rx / DC Orders ED Discharge Orders     None         Ninetta Basket, MD 01/24/24 [phone]

## 2024-01-22 NOTE — ED Provider Notes (Signed)
 Care transferred to me.  Patient continues to feel weak.  He is still tachycardic, around to 115 in the room on my exam.  His temp is only 98.8 though.  Unclear cause.  He has some mild nonfocal discomfort in his abdomen but he states has been ongoing on and off since February.  He seems to be too weak to go home at this time.  He would like to hold off on his repeat CT as the CT just a few days ago did not show any obvious findings.  Discussed with Dr. Adefeso for admission.   Jerilynn Montenegro, MD 01/22/24 2256

## 2024-01-22 NOTE — ED Triage Notes (Signed)
 Pt c/o of weakness and lower abd pain. Pt seen on Saturday for he same. Pt states he has been febrile through the night. Pts wife requesting blood cultures be drawn.

## 2024-01-22 NOTE — H&P (Signed)
 History and Physical    Patient: Bradley Goodman ZOX:[id number] DOB: September 30, 1956 DOA: 01/22/2024 DOS: the patient was seen and examined on 01/22/2024 PCP: [name], FNP  Patient coming from: Home  Chief Complaint:  Chief Complaint  Patient presents with   Weakness   HPI: Bradley Goodman is a 68 y.o. male with medical history significant of type 2 diabetes mellitus, hypertension, hyperlipidemia, hypothyroidism, adenocarcinoma of right lung who presents to the emergency department due to complaint of abdominal bloating and generalized weakness.  Patient complained of 1 week history of abdominal bloating and since he has a history of small bowel obstruction, he thinks he may be having a recurrent of an obstruction.  He complained of decreased appetite since onset of symptoms, though, if continued to have small bowel movements 1 to 2 days that he had a normal bowel movement.  He is complaining of cough and congestion that was already evaluated by his PCP and is currently on Zithromax.  He denies nausea, vomiting, lightheadedness or dizziness.  ED Course:  In the emergency department, patient was tachycardic, but other vital signs were within normal range.  Workup in the ED showed normocytic anemia, BMP was normal, albumin 2.7, urinalysis was normal, lipase 28.  Influenza A, B, SARS, rapid strep, RSV was negative. Chest x-ray showed no active cardiopulmonary disease CT abdomen and pelvis done 4 days ago (4/12) showed nonobstructing left renal stone.  Resolution of previously seen small bowel obstruction. Patient was treated with IV LR 1 L and IV NS 500 mL.  Hospitalist was asked to admit patient for further evaluation and management.   Review of Systems: Review of systems as noted in the HPI. All other systems reviewed and are negative.   Past Medical History:  Diagnosis Date   Cancer (HCC)    Cervical spondylolysis    Essential hypertension    GERD (gastroesophageal reflux disease)     History of kidney stones    History of migraine    Hyperlipidemia    Hypertension    PONV (postoperative nausea and vomiting)    Type 2 diabetes mellitus (HCC)    Past Surgical History:  Procedure Laterality Date   Bilateral inguinal hernia repair     BRONCHIAL NEEDLE ASPIRATION BIOPSY  04/05/2021   Procedure: BRONCHIAL NEEDLE ASPIRATION BIOPSIES;  Surgeon: Prudy Brownie, DO;  Location: [facility];  Service: Pulmonary;;   COLONOSCOPY  01/23/2012   Procedure: COLONOSCOPY;  Surgeon: Suzette Espy, MD;  Location: [facility];  Service: Endoscopy;  Laterality: N/A;  9:30 AM   COLONOSCOPY N/A 10/11/2015   Procedure: COLONOSCOPY;  Surgeon: Suzette Espy, MD;  Location: [facility];  Service: Endoscopy;  Laterality: N/A;  830   COLONOSCOPY N/A 10/14/2019   Procedure: COLONOSCOPY;  Surgeon: Suzette Espy, MD;  Location: [facility];  Service: Endoscopy;  Laterality: N/A;  1:45   POLYPECTOMY  10/14/2019   Procedure: POLYPECTOMY;  Surgeon: Suzette Espy, MD;  Location: [facility];  Service: Endoscopy;;  ascending colon, descending colon   VIDEO BRONCHOSCOPY WITH ENDOBRONCHIAL ULTRASOUND N/A 04/05/2021   Procedure: VIDEO BRONCHOSCOPY WITH ENDOBRONCHIAL ULTRASOUND;  Surgeon: Prudy Brownie, DO;  Location: [facility];  Service: Pulmonary;  Laterality: N/A;    Social History:  reports that he has been smoking cigarettes. He started smoking about 50 years ago. He has a 25.1 pack-year smoking history. He has never used smokeless tobacco. He reports that he does not currently use alcohol. He  reports that he does not use drugs.   No Known Allergies  Family History  Problem Relation Age of Onset   Hypertension Mother    Diabetes Mother    Heart attack Mother    Hypertension Father    Heart attack Father    Heart attack Brother    Colon cancer Neg Hx      Prior to Admission medications   Medication Sig Start Date End Date Taking? Authorizing Provider  dexamethasone (DECADRON) 1  MG tablet Take 1 tablet (1 mg total) by mouth daily with breakfast. 01/21/24  Yes [name], Doralee Albino, FNP  levothyroxine (SYNTHROID) 88 MCG tablet Take 1 tablet (88 mcg total) by mouth daily before breakfast. 11/26/23  Yes Nida, Denman George, MD  mirtazapine (REMERON) 15 MG tablet Take 1 tablet (15 mg total) by mouth at bedtime. 01/14/24  Yes [name], Doralee Albino, FNP  testosterone cypionate (DEPOTESTOTERONE CYPIONATE) 100 MG/ML injection Take 50mg  ( 0.77ml) and 100mg  ( 1ml) into muscle alternatively weekly. 05/07/23  Yes Roma Kayser, MD  azithromycin (ZITHROMAX Z-PAK) 250 MG tablet As directed 01/17/24   [name], FNP  chlorproMAZINE (THORAZINE) 10 MG tablet Take 1 tablet (10 mg total) by mouth 3 (three) times daily as needed for hiccoughs. 11/27/23   [name], FNP  cholecalciferol (VITAMIN D) 1000 UNITS tablet Take 1,000 Units by mouth every evening.    [provider]  Continuous Glucose Sensor (FREESTYLE LIBRE 3 SENSOR) MISC Place 1 sensor on the skin every 14 days. Use to check glucose continuously 03/18/23   [name], Doralee Albino, FNP  docusate sodium (COLACE) 100 MG capsule Take 100 mg by mouth daily.    [provider]  folic acid (FOLVITE) 1 MG tablet Take 1 tablet (1 mg total) by mouth daily. 12/13/23   [name], FNP  Krill Oil 300 MG CAPS Take 300 mg by mouth every evening.    [provider]  loratadine (CLARITIN) 10 MG tablet Take 10 mg by mouth every evening.     [provider]  losartan (COZAAR) 50 MG tablet Take 1.5 tablets (75 mg total) by mouth daily. 12/12/23   [name], FNP  Multiple Vitamins-Minerals (MULTIVITAMIN GUMMIES ADULT PO) Take 2 each by mouth daily.    [provider]  pantoprazole (PROTONIX) 40 MG tablet Take 1 tablet (40 mg total) by mouth every evening. 10/24/23   [name], FNP  PRESCRIPTION MEDICATION Testosterone injection    [provider]  rosuvastatin (CRESTOR) 20 MG tablet Take 1 tablet (20 mg  total) by mouth daily. 10/24/23   [name], Doralee Albino, FNP  SYRINGE-NEEDLE, DISP, 3 ML (B-D 3CC LUER-LOK SYR 21GX1-1/2) 21G X 1-1/2" 3 ML MISC Use to inject testosterone every week 09/02/23   Roma Kayser, MD  venlafaxine XR (EFFEXOR-XR) 75 MG 24 hr capsule Take 2 capsules (150 mg total) by mouth daily with breakfast. 12/24/23   [name], FNP    Physical Exam: BP 134/89 (BP Location: Left Arm)   Pulse (!) 120   Temp 98.4 F (36.9 C) (Oral)   Resp 20   Ht 5\' 11"  (1.803 m)   Wt 81.4 kg   SpO2 98%   BMI 25.02 kg/m   General: 68 y.o. year-old male well developed well nourished in no acute distress.  Alert and oriented x3. HEENT: NCAT, EOMI, dry mucous membranes Neck: Supple, trachea medial Cardiovascular: Regular rate and rhythm with no rubs or gallops.  No  thyromegaly or JVD noted.  No lower extremity edema. 2/4 pulses in all 4 extremities. Respiratory: Clear to auscultation with no wheezes or rales. Good inspiratory effort. Abdomen: Soft, nontender nondistended with normal bowel sounds x4 quadrants. Muskuloskeletal: No cyanosis, clubbing or edema noted bilaterally Neuro: CN II-XII intact, strength 5/5 x 4, sensation, reflexes intact Skin: Decreased skin tenting, no ulcerative lesions noted or rashes Psychiatry: Judgement and insight appear normal. Mood is appropriate for condition and setting          Labs on Admission:  Basic Metabolic Panel: Recent Labs  Lab 01/18/24 2047 01/22/24 1352  NA 137 135  K 3.6 3.7  CL 100 98  CO2 24 23  GLUCOSE 133* 86  BUN 16 10  CREATININE 1.09 1.01  CALCIUM 8.8* 8.7*   Liver Function Tests: Recent Labs  Lab 01/18/24 2047 01/22/24 1352  AST 27 25  ALT 22 17  ALKPHOS 68 76  BILITOT 0.7 0.3  PROT 7.2 7.1  ALBUMIN 3.0* 2.7*   Recent Labs  Lab 01/18/24 2047 01/22/24 1352  LIPASE 30 28   No results for input(s): "AMMONIA" in the last 168 hours. CBC: Recent Labs  Lab 01/18/24 2047 01/22/24 1352  WBC 9.3 7.6   NEUTROABS 7.2 4.9  HGB 10.2* 9.4*  HCT 35.5* 31.9*  MCV 86.4 86.2  PLT 300 231   Cardiac Enzymes: No results for input(s): "CKTOTAL", "CKMB", "CKMBINDEX", "TROPONINI" in the last 168 hours.  BNP (last 3 results) No results for input(s): "BNP" in the last 8760 hours.  ProBNP (last 3 results) No results for input(s): "PROBNP" in the last 8760 hours.  CBG: No results for input(s): "GLUCAP" in the last 168 hours.  Radiological Exams on Admission: DG Chest 2 View Result Date: 01/22/2024 CLINICAL DATA:  Cough, fever EXAM: CHEST - 2 VIEW COMPARISON:  11/20/2023 FINDINGS: Heart and mediastinal contours are within normal limits. No focal opacities or effusions. No acute bony abnormality. IMPRESSION: No active cardiopulmonary disease. Electronically Signed   By: Janeece Mechanic M.D.   On: 01/22/2024 19:11    EKG: I independently viewed the EKG done and my findings are as followed: EKG was not done in the ED  Assessment/Plan Present on Admission:  Essential hypertension  Mixed hyperlipidemia  Acquired hypothyroidism  GERD (gastroesophageal reflux disease)  Adenocarcinoma of right lung, stage 4 (HCC)  Principal Problem:   Generalized weakness Active Problems:   GERD (gastroesophageal reflux disease)   Essential hypertension   Mixed hyperlipidemia   Adenocarcinoma of right lung, stage 4 (HCC)   Acquired hypothyroidism   Type 2 diabetes mellitus (HCC)   Failure to thrive in adult   Hypoalbuminemia due to protein-calorie malnutrition (HCC)   Dehydration   Generalized weakness Failure to thrive in adult Hypoalbuminemia possibly due to moderate protein caloric malnutrition Albumin 2.7, protein supplement will be provided Dietitian will be consulted and will evaluate further recommendations Continue fall precaution Continue PT/OT eval and treat  Dehydration Continue IV hydration  Type 2 diabetes mellitus Last HbA1c on 01/14/2024 was 6.0   Patient not on antidiabetic  medication Continue diet and lifestyle modification  Essential hypertension (controlled) Continue losartan  Mixed hyperlipidemia   Continue Crestor   Acquired hypothyroidism:  Continue home oral levothyroxine   GERD Continue Protonix   Adenocarcinoma of lung, stage IV Patient has stage IV (T1b, N3, M1 C) non-small cell lung cancer.  He follows with Dr. Liam Redhead (last OV 01/13/2024) and is currently on maintenance treatment with Alimta and Keytruda every  three weeks. Completed 45 cycles  Other meds: Cholecalciferol, Decadron, multivitamins   DVT prophylaxis: Lovenox  Code Status: Full code  Family Communication: Wife at bedside  Consults: Dietitian  Severity of Illness: The appropriate patient status for this patient is INPATIENT. Inpatient status is judged to be reasonable and necessary in order to provide the required intensity of service to ensure the patient's safety. The patient's presenting symptoms, physical exam findings, and initial radiographic and laboratory data in the context of their chronic comorbidities is felt to place them at high risk for further clinical deterioration. Furthermore, it is not anticipated that the patient will be medically stable for discharge from the hospital within 2 midnights of admission.   * I certify that at the point of admission it is my clinical judgment that the patient will require inpatient hospital care spanning beyond 2 midnights from the point of admission due to high intensity of service, high risk for further deterioration and high frequency of surveillance required.*  Author: Tiffay Pinette, DO 01/22/2024 9:41 PM  For on call review [email or web address].

## 2024-01-23 DIAGNOSIS — K219 Gastro-esophageal reflux disease without esophagitis: Secondary | ICD-10-CM | POA: Diagnosis not present

## 2024-01-23 DIAGNOSIS — I10 Essential (primary) hypertension: Secondary | ICD-10-CM | POA: Diagnosis not present

## 2024-01-23 DIAGNOSIS — R627 Adult failure to thrive: Secondary | ICD-10-CM | POA: Diagnosis not present

## 2024-01-23 DIAGNOSIS — E8809 Other disorders of plasma-protein metabolism, not elsewhere classified: Secondary | ICD-10-CM | POA: Diagnosis not present

## 2024-01-23 DIAGNOSIS — F17210 Nicotine dependence, cigarettes, uncomplicated: Secondary | ICD-10-CM | POA: Diagnosis not present

## 2024-01-23 DIAGNOSIS — E119 Type 2 diabetes mellitus without complications: Secondary | ICD-10-CM | POA: Diagnosis not present

## 2024-01-23 DIAGNOSIS — E039 Hypothyroidism, unspecified: Secondary | ICD-10-CM | POA: Diagnosis not present

## 2024-01-23 DIAGNOSIS — Z859 Personal history of malignant neoplasm, unspecified: Secondary | ICD-10-CM | POA: Diagnosis not present

## 2024-01-23 DIAGNOSIS — R531 Weakness: Secondary | ICD-10-CM | POA: Diagnosis not present

## 2024-01-23 DIAGNOSIS — E860 Dehydration: Secondary | ICD-10-CM | POA: Diagnosis not present

## 2024-01-23 LAB — COMPREHENSIVE METABOLIC PANEL WITH GFR
ALT: 13 U/L (ref 0–44)
AST: 18 U/L (ref 15–41)
Albumin: 2.2 g/dL — ABNORMAL LOW (ref 3.5–5.0)
Alkaline Phosphatase: 62 U/L (ref 38–126)
Anion gap: 11 (ref 5–15)
BUN: 9 mg/dL (ref 8–23)
CO2: 23 mmol/L (ref 22–32)
Calcium: 8.1 mg/dL — ABNORMAL LOW (ref 8.9–10.3)
Chloride: 102 mmol/L (ref 98–111)
Creatinine, Ser: 0.95 mg/dL (ref 0.61–1.24)
GFR, Estimated: >60 mL/min (ref >60)
Glucose, Bld: 80 mg/dL (ref 70–99)
Potassium: 3.6 mmol/L (ref 3.5–5.1)
Sodium: 136 mmol/L (ref 135–145)
Total Bilirubin: 0.7 mg/dL (ref 0.0–1.2)
Total Protein: 5.7 g/dL — ABNORMAL LOW (ref 6.5–8.1)

## 2024-01-23 LAB — MAGNESIUM: Magnesium: 1.7 mg/dL (ref 1.7–2.4)

## 2024-01-23 LAB — CBC
HCT: 29.5 % — ABNORMAL LOW (ref 39.0–52.0)
Hemoglobin: 8.6 g/dL — ABNORMAL LOW (ref 13.0–17.0)
MCH: 24.6 pg — ABNORMAL LOW (ref 26.0–34.0)
MCHC: 29.2 g/dL — ABNORMAL LOW (ref 30.0–36.0)
MCV: 84.5 fL (ref 80.0–100.0)
Platelets: 218 10*3/uL (ref 150–400)
RBC: 3.49 MIL/uL — ABNORMAL LOW (ref 4.22–5.81)
RDW: 19 % — ABNORMAL HIGH (ref 11.5–15.5)
WBC: 6.2 10*3/uL (ref 4.0–10.5)
nRBC: 0 % (ref 0.0–0.2)

## 2024-01-23 LAB — TROPONIN I (HIGH SENSITIVITY)
Troponin I (High Sensitivity): 5 ng/L (ref <18)
Troponin I (High Sensitivity): 5 ng/L (ref <18)

## 2024-01-23 LAB — PHOSPHORUS: Phosphorus: 2.9 mg/dL (ref 2.5–4.6)

## 2024-01-23 MED ORDER — LORATADINE 10 MG PO TABS
10.0000 mg | ORAL_TABLET | Freq: Every evening | ORAL | Status: DC
  Administered 2024-01-23: 10 mg via ORAL
  Filled 2024-01-23: qty 1

## 2024-01-23 MED ORDER — DEXAMETHASONE 0.5 MG PO TABS
1.0000 mg | ORAL_TABLET | Freq: Every day | ORAL | Status: DC
  Administered 2024-01-23 – 2024-01-24 (×2): 1 mg via ORAL
  Filled 2024-01-23 (×3): qty 2

## 2024-01-23 MED ORDER — ROSUVASTATIN CALCIUM 20 MG PO TABS
20.0000 mg | ORAL_TABLET | Freq: Every day | ORAL | Status: DC
  Administered 2024-01-23: 20 mg via ORAL
  Filled 2024-01-23 (×2): qty 1

## 2024-01-23 MED ORDER — DOCUSATE SODIUM 100 MG PO CAPS
100.0000 mg | ORAL_CAPSULE | Freq: Every day | ORAL | Status: DC
  Administered 2024-01-23 – 2024-01-24 (×2): 100 mg via ORAL
  Filled 2024-01-23 (×2): qty 1

## 2024-01-23 MED ORDER — MIRTAZAPINE 15 MG PO TABS
15.0000 mg | ORAL_TABLET | Freq: Every day | ORAL | Status: DC
  Administered 2024-01-23: 15 mg via ORAL
  Filled 2024-01-23: qty 1

## 2024-01-23 MED ORDER — ADULT MULTIVITAMIN W/MINERALS CH
1.0000 | ORAL_TABLET | Freq: Every day | ORAL | Status: DC
  Administered 2024-01-23: 1 via ORAL
  Filled 2024-01-23 (×2): qty 1

## 2024-01-23 MED ORDER — VITAMIN D 25 MCG (1000 UNIT) PO TABS
1000.0000 [IU] | ORAL_TABLET | Freq: Every evening | ORAL | Status: DC
  Administered 2024-01-23: 1000 [IU] via ORAL
  Filled 2024-01-23: qty 1

## 2024-01-23 MED ORDER — CHLORPROMAZINE HCL 10 MG PO TABS: 10.0000 mg | ORAL_TABLET | Freq: Three times a day (TID) | ORAL | Status: DC | PRN

## 2024-01-23 MED ORDER — PANTOPRAZOLE SODIUM 40 MG PO TBEC
40.0000 mg | DELAYED_RELEASE_TABLET | Freq: Every evening | ORAL | Status: DC
  Administered 2024-01-23: 40 mg via ORAL
  Filled 2024-01-23: qty 1

## 2024-01-23 MED ORDER — LEVOTHYROXINE SODIUM 88 MCG PO TABS
88.0000 ug | ORAL_TABLET | Freq: Every day | ORAL | Status: DC
  Administered 2024-01-23 – 2024-01-24 (×2): 88 ug via ORAL
  Filled 2024-01-23 (×2): qty 1

## 2024-01-23 MED ORDER — FOLIC ACID 1 MG PO TABS
1.0000 mg | ORAL_TABLET | Freq: Every day | ORAL | Status: DC
  Administered 2024-01-23 – 2024-01-24 (×2): 1 mg via ORAL
  Filled 2024-01-23 (×2): qty 1

## 2024-01-23 MED ORDER — LOSARTAN POTASSIUM 50 MG PO TABS
75.0000 mg | ORAL_TABLET | Freq: Every day | ORAL | Status: DC
  Administered 2024-01-23: 75 mg via ORAL
  Filled 2024-01-23 (×2): qty 2

## 2024-01-23 NOTE — TOC Initial Note (Signed)
 Transition of Care [facility]) - Initial/Assessment Note    Patient Details  Name: Bradley Goodman MRN: [id number] Date of Birth: 06-Aug-1956  [facility] CM/SW Contact:    Villa Herb, LCSWA Phone Number: 01/23/2024, 10:52 AM  Clinical Narrative:                 Pt is high risk for readmission. CSW met with pt at bedside to complete assessment. Pt lives with his spouse. Pt is independent in completing his ADLs and drives when needed. Pt has not had HH. Pt does not use any DME. TOC to follow.   Expected Discharge Plan: Home/Self Care Barriers to Discharge: Continued Medical Work up   Patient Goals and CMS Choice Patient states their goals for this hospitalization and ongoing recovery are:: return home CMS Medicare.gov Compare Post Acute Care list provided to:: Patient        Expected Discharge Plan and Services                                              Prior Living Arrangements/Services     Patient language and need for interpreter reviewed:: Yes        Need for Family Participation in Patient Care: Yes (Comment) Care giver support system in place?: Yes (comment)   Criminal Activity/Legal Involvement Pertinent to Current Situation/Hospitalization: No - Comment as needed  Activities of Daily Living   ADL Screening (condition at time of admission) Independently performs ADLs?: Yes (appropriate for developmental age) Is the patient deaf or have difficulty hearing?: No Does the patient have difficulty seeing, even when wearing glasses/contacts?: No Does the patient have difficulty concentrating, remembering, or making decisions?: No  Permission Sought/Granted                  Emotional Assessment     Affect (typically observed): Accepting Orientation: : Oriented to Self, Oriented to Place, Oriented to  Time, Oriented to Situation Alcohol / Substance Use: Not Applicable Psych Involvement: No (comment)  Admission diagnosis:   Generalized weakness [R53.1] Patient Active Problem List   Diagnosis Date Noted   Generalized weakness 01/22/2024   Failure to thrive in adult 01/22/2024   Hypoalbuminemia due to protein-calorie malnutrition (HCC) 01/22/2024   Dehydration 01/22/2024   Type 2 diabetes mellitus (HCC) 01/14/2024   Leukocytosis 11/18/2023   Thrombocytosis 11/18/2023   Current smoker 06/14/2022   Recurrent major depression in remission (HCC) 04/26/2022   Type 2 diabetes mellitus with hyperglycemia, without long-term current use of insulin (HCC) 01/10/2022   Adrenal insufficiency (HCC) 06/27/2021   Hypogonadism, male 06/27/2021   Acquired hypothyroidism 06/27/2021   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   Adenocarcinoma of right lung, stage 4 (HCC) 04/25/2021   Encounter for antineoplastic chemotherapy 04/25/2021   Encounter for antineoplastic immunotherapy 04/25/2021   Malignant neoplasm metastatic to brain (HCC) 04/05/2021   Adenopathy 04/03/2021   Tobacco use 07/14/2020   Mixed hyperlipidemia 11/09/2019   Vitamin D deficiency 07/08/2019   Depression, recurrent (HCC) 07/08/2019   History of colonic polyps    History of adenomatous polyp of colon 09/14/2015   GERD (gastroesophageal reflux disease) 06/24/2013   Essential hypertension 06/24/2013   PCP:  [name], FNP Pharmacy:   [facility] [address] [place] [address] Phone: [phone] Fax: [phone]  [facility] [address]  [place] [address] [place] [address] Phone: [phone] Fax: [phone]     Social Drivers of Health (SDOH) Social History: SDOH Screenings   Food Insecurity: No Food Insecurity (01/22/2024)  Housing: Low Risk  (01/22/2024)  Transportation Needs: No Transportation Needs (01/22/2024)  Utilities: Not At Risk (01/22/2024)  Alcohol Screen: Low Risk  (04/15/2023)  Depression (PHQ2-9): Low Risk  (01/14/2024)  Financial Resource Strain:  Patient Declined (10/14/2023)  Physical Activity: Unknown (10/14/2023)  Social Connections: Moderately Isolated (01/22/2024)  Stress: No Stress Concern Present (10/14/2023)  Tobacco Use: High Risk (01/22/2024)   SDOH Interventions:     Readmission Risk Interventions    01/23/2024   10:04 AM 01/22/2024    9:39 PM  Readmission Risk Prevention Plan  Transportation Screening  Complete  PCP or Specialist Appt within 5-7 Days  Complete  Home Care Screening  Complete  Medication Review (RN CM)  Complete  HRI or Home Care Consult Complete   Social Work Consult for Recovery Care Planning/Counseling Complete   Palliative Care Screening Not Applicable   Medication Review Oceanographer) Complete

## 2024-01-23 NOTE — Progress Notes (Signed)
 Initial Nutrition Assessment  DOCUMENTATION CODES:   Not applicable  INTERVENTION:   Liberalize diet to regular. Ensure Enlive (chocolate) po BID, each supplement provides 350 kcal and 20 grams of protein.  NUTRITION DIAGNOSIS:   Inadequate oral intake related to acute illness, nausea as evidenced by per patient/family report.  GOAL:   Patient will meet greater than or equal to 90% of their needs  MONITOR:   PO intake, Supplement acceptance  REASON FOR ASSESSMENT:   Consult Assessment of nutrition requirement/status  ASSESSMENT:   68 yo male admitted with generalized weakness, abdominal bloating, FTT. PMH includes GERD, HTN, HLD, DM-2, cervical spondylolysis, smoker, Lung CA.  Spoke with patient over the phone. He usually eats 2 big meals per day. For the past week, he has had a poor appetite and early satiety. He thinks he has lost 1-2 lbs over the past week. He ate ~1/2 of a piece of [place] toast for breakfast today and it felt like he ate 6 pieces. He has been taking liquids well. He drinks high protein shakes at home sometimes, but agreed to try Ensure Enlive shakes while in the hospital.   Currently on a carb modified heart healthy diet. Meal intakes not recorded.   Weight history reviewed. Patient has had a 3% weight loss over the past 3 months, which is not significant for the time frame.   Labs reviewed. Medications reviewed and include cholecalciferol, decadron, MVI with minerals.  NUTRITION - FOCUSED PHYSICAL EXAM:  Unable to complete Patient with mild-moderate pitting edema per RN documentation.  Diet Order:   Diet Order             Diet heart healthy/carb modified Room service appropriate? Yes; Fluid consistency: Thin  Diet effective now                   EDUCATION NEEDS:   No education needs have been identified at this time  Skin:  Skin Assessment: Reviewed RN Assessment  Last BM:  4/16  Height:   Ht Readings from Last 1 Encounters:   01/22/24 5\' 11"  (1.803 m)    Weight:   Wt Readings from Last 1 Encounters:  01/22/24 81.4 kg    Ideal Body Weight:  78.2 kg  BMI:  Body mass index is 25.02 kg/m.  Estimated Nutritional Needs:   Kcal:  2000-2200  Protein:  100-120 gm  Fluid:  2-2.2 L   Barnet Boots RD, LDN, CNSC Contact via secure chat. If unavailable, use group chat "RD Inpatient."

## 2024-01-23 NOTE — Progress Notes (Signed)
 PROGRESS NOTE    Bradley Goodman  ZOX:[id number] DOB: 1956/09/01 DOA: 01/22/2024 PCP: [name], FNP   Brief Narrative:    Bradley Goodman is a 68 y.o. male with medical history significant of type 2 diabetes mellitus, hypertension, hyperlipidemia, hypothyroidism, adenocarcinoma of right lung who presents to the emergency department due to complaint of abdominal bloating and generalized weakness.  Patient complained of 1 week history of abdominal bloating and since he has a history of small bowel obstruction, he thinks he may be having a recurrent of an obstruction. CT abdomen and pelvis done 4 days ago (4/12) showed nonobstructing left renal stone. Resolution of previously seen small bowel obstruction.   Assessment & Plan:   Principal Problem:   Generalized weakness Active Problems:   GERD (gastroesophageal reflux disease)   Essential hypertension   Mixed hyperlipidemia   Adenocarcinoma of right lung, stage 4 (HCC)   Acquired hypothyroidism   Type 2 diabetes mellitus (HCC)   Failure to thrive in adult   Hypoalbuminemia due to protein-calorie malnutrition (HCC)   Dehydration  Assessment and Plan:   Generalized weakness Failure to thrive in adult with poor oral intake possibly related to ileus versus partial small bowel obstruction Hypoalbuminemia possibly due to moderate protein caloric malnutrition Albumin 2.7, protein supplement will be provided Dietitian will be consulted and will evaluate further recommendations Continue fall precaution Continue PT/OT eval and treat Allow ambulation in hallway   Dehydration Continue IV hydration   Type 2 diabetes mellitus Last HbA1c on 01/14/2024 was 6.0   Patient not on antidiabetic medication Continue diet and lifestyle modification   Essential hypertension (controlled) Continue losartan   Mixed hyperlipidemia   Continue Crestor   Acquired hypothyroidism:  Continue home oral levothyroxine    GERD Continue Protonix    Adenocarcinoma of lung, stage IV Patient has stage IV (T1b, N3, M1 C) non-small cell lung cancer.  He follows with Dr. Shirline Frees (last OV 01/13/2024) and is currently on maintenance treatment with Alimta and Keytruda every three weeks. Completed 45 cycles   Other meds: Cholecalciferol, Decadron, multivitamins    DVT prophylaxis:Lovenox Code Status: Full Family Communication: Spouse at bedside 4/17 Disposition Plan:  Status is: Inpatient Remains inpatient appropriate because: Need for IV medications  Consultants:  None  Procedures:  None  Antimicrobials:  None   Subjective: Patient seen and evaluated today with complaints of some ongoing nausea as well as lower abdominal pain.  He states that he has had some bowel movements yesterday as well as this morning and continues to pass flatus.  He denies any vomiting and has had some small bites of breakfast.  Objective: Vitals:   01/22/24 2041 01/22/24 2250 01/23/24 0259 01/23/24 0549  BP: 134/89 107/67 127/84 116/71  Pulse: (!) 120 (!) 108 (!) 103 (!) 103  Resp: 20 18 20    Temp: 98.4 F (36.9 C) 100 F (37.8 C) 98.7 F (37.1 C) 98.4 F (36.9 C)  TempSrc: Oral Oral Oral Oral  SpO2: 98% 97% 96% 96%  Weight: 81.4 kg     Height:        Intake/Output Summary (Last 24 hours) at 01/23/2024 0718 Last data filed at 01/23/2024 8119 Gross per 24 hour  Intake 1421.15 ml  Output --  Net 1421.15 ml   Filed Weights   01/22/24 1309 01/22/24 2041  Weight: 82 kg 81.4 kg    Examination:  General exam: Appears calm and comfortable  Respiratory system: Clear to auscultation. Respiratory effort normal. Cardiovascular system:  S1 & S2 heard, RRR.  Gastrointestinal system: Abdomen is soft Central nervous system: Alert and awake Extremities: No edema Skin: No significant lesions noted Psychiatry: Flat affect.    Data Reviewed: I have personally reviewed following labs and imaging studies  CBC: Recent Labs  Lab 01/18/24 2047  01/22/24 1352 01/23/24 0341  WBC 9.3 7.6 6.2  NEUTROABS 7.2 4.9  --   HGB 10.2* 9.4* 8.6*  HCT 35.5* 31.9* 29.5*  MCV 86.4 86.2 84.5  PLT 300 231 218   Basic Metabolic Panel: Recent Labs  Lab 01/18/24 2047 01/22/24 1352 01/23/24 0341  NA 137 135 136  K 3.6 3.7 3.6  CL 100 98 102  CO2 24 23 23   GLUCOSE 133* 86 80  BUN 16 10 9   CREATININE 1.09 1.01 0.95  CALCIUM 8.8* 8.7* 8.1*  MG  --  1.9 1.7  PHOS  --   --  2.9   GFR: Estimated Creatinine Clearance: 81.5 mL/min (by C-G formula based on SCr of 0.95 mg/dL). Liver Function Tests: Recent Labs  Lab 01/18/24 2047 01/22/24 1352 01/23/24 0341  AST 27 25 18   ALT 22 17 13   ALKPHOS 68 76 62  BILITOT 0.7 0.3 0.7  PROT 7.2 7.1 5.7*  ALBUMIN 3.0* 2.7* 2.2*   Recent Labs  Lab 01/18/24 2047 01/22/24 1352  LIPASE 30 28   No results for input(s): "AMMONIA" in the last 168 hours. Coagulation Profile: No results for input(s): "INR", "PROTIME" in the last 168 hours. Cardiac Enzymes: No results for input(s): "CKTOTAL", "CKMB", "CKMBINDEX", "TROPONINI" in the last 168 hours. BNP (last 3 results) No results for input(s): "PROBNP" in the last 8760 hours. HbA1C: No results for input(s): "HGBA1C" in the last 72 hours. CBG: No results for input(s): "GLUCAP" in the last 168 hours. Lipid Profile: No results for input(s): "CHOL", "HDL", "LDLCALC", "TRIG", "CHOLHDL", "LDLDIRECT" in the last 72 hours. Thyroid Function Tests: No results for input(s): "TSH", "T4TOTAL", "FREET4", "T3FREE", "THYROIDAB" in the last 72 hours. Anemia Panel: No results for input(s): "VITAMINB12", "FOLATE", "FERRITIN", "TIBC", "IRON", "RETICCTPCT" in the last 72 hours. Sepsis Labs: No results for input(s): "PROCALCITON", "LATICACIDVEN" in the last 168 hours.  Recent Results (from the past 240 hours)  Resp panel by RT-PCR (RSV, Flu A&B, Covid) Anterior Nasal Swab     Status: None   Collection Time: 01/22/24  3:10 PM   Specimen: Anterior Nasal Swab   Result Value Ref Range Status   SARS Coronavirus 2 by RT PCR NEGATIVE NEGATIVE Final    Comment: (NOTE) SARS-CoV-2 target nucleic acids are NOT DETECTED.  The SARS-CoV-2 RNA is generally detectable in upper respiratory specimens during the acute phase of infection. The lowest concentration of SARS-CoV-2 viral copies this assay can detect is 138 copies/mL. A negative result does not preclude SARS-Cov-2 infection and should not be used as the sole basis for treatment or other patient management decisions. A negative result may occur with  improper specimen collection/handling, submission of specimen other than nasopharyngeal swab, presence of viral mutation(s) within the areas targeted by this assay, and inadequate number of viral copies(<138 copies/mL). A negative result must be combined with clinical observations, patient history, and epidemiological information. The expected result is Negative.  Fact Sheet for Patients:  BloggerCourse.com  Fact Sheet for Healthcare Providers:  SeriousBroker.it  This test is no t yet approved or cleared by the [place] FDA and  has been authorized for detection and/or diagnosis of SARS-CoV-2 by FDA under an Emergency Use Authorization (EUA).  This EUA will remain  in effect (meaning this test can be used) for the duration of the COVID-19 declaration under Section 564(b)(1) of the Act, 21 U.S.C.section 360bbb-3(b)(1), unless the authorization is terminated  or revoked sooner.       Influenza A by PCR NEGATIVE NEGATIVE Final   Influenza B by PCR NEGATIVE NEGATIVE Final    Comment: (NOTE) The Xpert Xpress SARS-CoV-2/FLU/RSV plus assay is intended as an aid in the diagnosis of influenza from Nasopharyngeal swab specimens and should not be used as a sole basis for treatment. Nasal washings and aspirates are unacceptable for Xpert Xpress SARS-CoV-2/FLU/RSV testing.  Fact Sheet for  Patients: BloggerCourse.com  Fact Sheet for Healthcare Providers: SeriousBroker.it  This test is not yet approved or cleared by the [place]  FDA and has been authorized for detection and/or diagnosis of SARS-CoV-2 by FDA under an Emergency Use Authorization (EUA). This EUA will remain in effect (meaning this test can be used) for the duration of the COVID-19 declaration under Section 564(b)(1) of the Act, 21 U.S.C. section 360bbb-3(b)(1), unless the authorization is terminated or revoked.     Resp Syncytial Virus by PCR NEGATIVE NEGATIVE Final    Comment: (NOTE) Fact Sheet for Patients: BloggerCourse.com  Fact Sheet for Healthcare Providers: SeriousBroker.it  This test is not yet approved or cleared by the [place]  FDA and has been authorized for detection and/or diagnosis of SARS-CoV-2 by FDA under an Emergency Use Authorization (EUA). This EUA will remain in effect (meaning this test can be used) for the duration of the COVID-19 declaration under Section 564(b)(1) of the Act, 21 U.S.C. section 360bbb-3(b)(1), unless the authorization is terminated or revoked.  Performed at [facility], [address], [place], [place] [address]          Radiology Studies: DG Chest 2 View Result Date: 01/22/2024 CLINICAL DATA:  Cough, fever EXAM: CHEST - 2 VIEW COMPARISON:  11/20/2023 FINDINGS: Heart and mediastinal contours are within normal limits. No focal opacities or effusions. No acute bony abnormality. IMPRESSION: No active cardiopulmonary disease. Electronically Signed   By: Janeece Mechanic M.D.   On: 01/22/2024 19:11        Scheduled Meds:  cholecalciferol  1,000 Units Oral QPM   dexamethasone  1 mg Oral Q breakfast   enoxaparin (LOVENOX) injection  40 mg Subcutaneous Q24H   feeding supplement  237 mL Oral BID BM   levothyroxine  88 mcg Oral Q0600   losartan  75 mg  Oral Daily   multivitamin with minerals  1 tablet Oral Daily   pantoprazole  40 mg Oral QPM   pneumococcal 20-valent conjugate vaccine  0.5 mL Intramuscular Tomorrow-1000   rosuvastatin  20 mg Oral Daily     LOS: 1 day    Time spent: 55 minutes    Maya Arcand Loran Rock, DO [facility]  If 7PM-7AM, please contact night-coverage [email or web address] 01/23/2024, 7:18 AM

## 2024-01-23 NOTE — Progress Notes (Signed)
 PT Cancellation Note  Patient Details Name: Bradley Goodman MRN: [id number] DOB: Dec 19, 1955   Cancelled Treatment:    Reason Eval/Treat Not Completed: PT screened, no needs identified, will sign off. Patient ambulating independently in room and encouraged to ambulate in room, hallways ad lib for length of stay.   1:39 PM, 01/23/24 [name], MPT Physical Therapist with [facility] [phone] office [phone] mobile phone

## 2024-01-23 NOTE — Progress Notes (Signed)
 Patient hit left elbow coming out of restroom. Applied xeroform and optifoam. Dr, Mason Sole made aware

## 2024-01-23 NOTE — Progress Notes (Signed)
   01/23/24 2119  Provider Notification  Provider Name/Title Elyse Hand, DO  Date Provider Notified 01/23/24  Time Provider Notified 2119  Method of Notification Page  Notification Reason Red med refusal

## 2024-01-23 NOTE — Plan of Care (Signed)
  Problem: Education: Goal: Knowledge of General Education information will improve Description: Including pain rating scale, medication(s)/side effects and non-pharmacologic comfort measures Outcome: Progressing   Problem: Clinical Measurements: Goal: Ability to maintain clinical measurements within normal limits will improve Outcome: Progressing   Problem: Coping: Goal: Level of anxiety will decrease Outcome: Progressing   

## 2024-01-24 ENCOUNTER — Other Ambulatory Visit (HOSPITAL_COMMUNITY): Payer: Self-pay

## 2024-01-24 ENCOUNTER — Other Ambulatory Visit: Payer: Self-pay

## 2024-01-24 ENCOUNTER — Encounter: Payer: Self-pay | Admitting: Internal Medicine

## 2024-01-24 DIAGNOSIS — R531 Weakness: Secondary | ICD-10-CM | POA: Diagnosis not present

## 2024-01-24 LAB — COMPREHENSIVE METABOLIC PANEL WITH GFR
ALT: 15 U/L (ref 0–44)
AST: 19 U/L (ref 15–41)
Albumin: 2.3 g/dL — ABNORMAL LOW (ref 3.5–5.0)
Alkaline Phosphatase: 65 U/L (ref 38–126)
Anion gap: 8 (ref 5–15)
BUN: 7 mg/dL — ABNORMAL LOW (ref 8–23)
CO2: 24 mmol/L (ref 22–32)
Calcium: 8.5 mg/dL — ABNORMAL LOW (ref 8.9–10.3)
Chloride: 104 mmol/L (ref 98–111)
Creatinine, Ser: 0.98 mg/dL (ref 0.61–1.24)
GFR, Estimated: >60 mL/min (ref >60)
Glucose, Bld: 106 mg/dL — ABNORMAL HIGH (ref 70–99)
Potassium: 3.7 mmol/L (ref 3.5–5.1)
Sodium: 136 mmol/L (ref 135–145)
Total Bilirubin: 0.5 mg/dL (ref 0.0–1.2)
Total Protein: 6.1 g/dL — ABNORMAL LOW (ref 6.5–8.1)

## 2024-01-24 LAB — CBC
HCT: 31.1 % — ABNORMAL LOW (ref 39.0–52.0)
Hemoglobin: 9.2 g/dL — ABNORMAL LOW (ref 13.0–17.0)
MCH: 24.9 pg — ABNORMAL LOW (ref 26.0–34.0)
MCHC: 29.6 g/dL — ABNORMAL LOW (ref 30.0–36.0)
MCV: 84.3 fL (ref 80.0–100.0)
Platelets: 232 10*3/uL (ref 150–400)
RBC: 3.69 MIL/uL — ABNORMAL LOW (ref 4.22–5.81)
RDW: 18.6 % — ABNORMAL HIGH (ref 11.5–15.5)
WBC: 5.4 10*3/uL (ref 4.0–10.5)
nRBC: 0 % (ref 0.0–0.2)

## 2024-01-24 LAB — MAGNESIUM: Magnesium: 2 mg/dL (ref 1.7–2.4)

## 2024-01-24 MED ORDER — ENSURE ENLIVE PO LIQD
237.0000 mL | Freq: Two times a day (BID) | ORAL | 12 refills | Status: DC
  Filled 2024-01-24: qty 237, 1d supply, fill #0

## 2024-01-24 MED ORDER — ONDANSETRON HCL 4 MG PO TABS
4.0000 mg | ORAL_TABLET | Freq: Four times a day (QID) | ORAL | 0 refills | Status: DC | PRN
  Filled 2024-01-24: qty 20, 5d supply, fill #0

## 2024-01-24 NOTE — Discharge Summary (Signed)
 Physician Discharge Summary  SAADIQ POCHE FMW:[id number] DOB: Jan 25, 1956 DOA: 01/22/2024  PCP: [name], FNP  Admit date: 01/22/2024  Discharge date: 01/24/2024  Admitted From:Home  Disposition:  Home  Recommendations for Outpatient Follow-up:  Follow up with PCP in 1-2 weeks Follow-up with GI outpatient with referral sent Continue on Ensure BID  Continue other home medications as noted below  Home Health:None  Equipment/Devices:None  Discharge Condition:Stable  CODE STATUS: Full  Diet recommendation: Heart Healthy/Carb Modified  Brief/Interim Summary: DANZIG MACGREGOR is a 68 y.o. male with medical history significant of type 2 diabetes mellitus, hypertension, hyperlipidemia, hypothyroidism, adenocarcinoma of right lung who presents to the emergency department due to complaint of abdominal bloating and generalized weakness.  Patient complained of 1 week history of abdominal bloating and since he has a history of small bowel obstruction, he thinks he may be having a recurrent of an obstruction. CT abdomen and pelvis done 4 days ago (4/12) showed nonobstructing left renal stone. Resolution of previously seen small bowel obstruction. He was admitted with possible FTT in the setting of ileus vs partial SBO. His symptoms have resolved with no further abdominal pain, nausea, or vomiting. He is tolerating his diet and having bowel movements. He is in stable condition for discharge with plans to follow up with GI outpatient.   Discharge Diagnoses:  Principal Problem:   Generalized weakness Active Problems:   GERD (gastroesophageal reflux disease)   Essential hypertension   Mixed hyperlipidemia   Adenocarcinoma of right lung, stage 4 (HCC)   Acquired hypothyroidism   Type 2 diabetes mellitus (HCC)   Failure to thrive  in adult   Hypoalbuminemia due to protein-calorie malnutrition (HCC)   Dehydration  Principal discharge diagnosis: Nausea with lower abdominal pain possibly  related to ileus versus partial SBO.  Discharge Instructions  Discharge Instructions     Ambulatory referral to Gastroenterology   Complete by: As directed    What is the reason for referral?: Other   Diet - low sodium heart healthy   Complete by: As directed    Increase activity slowly   Complete by: As directed       Allergies as of 01/24/2024   No Known Allergies      Medication List     STOP taking these medications    azithromycin  250 MG tablet Commonly known as: Zithromax  Z-Pak       TAKE these medications    acetaminophen  500 MG tablet Commonly known as: TYLENOL  Take 1,000 mg by mouth every 6 (six) hours as needed for mild pain (pain score 1-3) or fever.   B-D 3CC LUER-LOK SYR 21GX1-1/2 21G X 1-1/2 3 ML Misc Generic drug: SYRINGE-NEEDLE (DISP) 3 ML Use to inject testosterone  every week   chlorproMAZINE  10 MG tablet Commonly known as: THORAZINE  Take 1 tablet (10 mg total) by mouth 3 (three) times daily as needed for hiccoughs.   cholecalciferol  1000 units tablet Commonly known as: VITAMIN D  Take 1,000 Units by mouth every evening.   dexamethasone  1 MG tablet Commonly known as: DECADRON  Take 1 tablet (1 mg total) by mouth daily with breakfast.   docusate sodium  100 MG capsule Commonly known as: COLACE Take 100 mg by mouth daily.   feeding supplement Liqd Take 237 mLs by mouth 2 (two) times daily between meals.   folic acid  1 MG tablet Commonly known as: FOLVITE  Take 1 tablet (1 mg total) by mouth daily.   FreeStyle Libre 3 Sensor Misc Place 1 sensor on  the skin every 14 days. Use to check glucose continuously   Krill Oil 300 MG Caps Take 300 mg by mouth every evening.   levothyroxine  88 MCG tablet Commonly known as: SYNTHROID  Take 1 tablet (88 mcg total) by mouth daily before breakfast.   loratadine  10 MG tablet Commonly known as: CLARITIN  Take 10 mg by mouth every evening.   losartan  50 MG tablet Commonly known as: COZAAR  Take 1.5  tablets (75 mg total) by mouth daily.   mirtazapine  15 MG tablet Commonly known as: REMERON  Take 1 tablet (15 mg total) by mouth at bedtime.   MULTIVITAMIN GUMMIES ADULT PO Take 2 each by mouth daily.   ondansetron  4 MG tablet Commonly known as: ZOFRAN  Take 1 tablet (4 mg total) by mouth every 6 (six) hours as needed for nausea.   pantoprazole  40 MG tablet Commonly known as: PROTONIX  Take 1 tablet (40 mg total) by mouth every evening.   PRESCRIPTION MEDICATION Testosterone  injection   rosuvastatin  20 MG tablet Commonly known as: CRESTOR  Take 1 tablet (20 mg total) by mouth daily.   testosterone  cypionate 100 MG/ML injection Commonly known as: DEPOTESTOTERONE CYPIONATE Take 50mg  ( 0.46ml) and 100mg  ( 1ml) into muscle alternatively weekly.   venlafaxine  XR 75 MG 24 hr capsule Commonly known as: EFFEXOR -XR Take 2 capsules (150 mg total) by mouth daily with breakfast.        Follow-up Information     [name], [name], FNP. Schedule an appointment as soon as possible for a visit in 1 week(s).   Specialty: Family Medicine Contact information: [address] [place] [address] [phone]         [facility]. Go to.   Contact information: [address] [place]  [address] [phone]               No Known Allergies  Consultations: None   Procedures/Studies: DG Chest 2 View Result Date: 01/22/2024 CLINICAL DATA:  Cough, fever EXAM: CHEST - 2 VIEW COMPARISON:  11/20/2023 FINDINGS: Heart and mediastinal contours are within normal limits. No focal opacities or effusions. No acute bony abnormality. IMPRESSION: No active cardiopulmonary disease. Electronically Signed   By: Franky Crease M.D.   On: 01/22/2024 19:11   CT ABDOMEN PELVIS W CONTRAST Result Date: 01/18/2024 CLINICAL DATA:  Right lower quadrant pain EXAM: CT ABDOMEN AND PELVIS WITH CONTRAST TECHNIQUE: Multidetector CT imaging of the abdomen and pelvis  was performed using the standard protocol following bolus administration of intravenous contrast. RADIATION DOSE REDUCTION: This exam was performed according to the departmental dose-optimization program which includes automated exposure control, adjustment of the mA and/or kV according to patient size and/or use of iterative reconstruction technique. CONTRAST:  OMNIPAQUE  IOHEXOL  300 MG/ML  SOLN COMPARISON:  11/18/2023 FINDINGS: Lower chest: No acute abnormality. Hepatobiliary: No focal liver abnormality is seen. No gallstones, gallbladder wall thickening, or biliary dilatation. Stable small hepatic cyst is noted in the right lobe. Pancreas: Unremarkable. No pancreatic ductal dilatation or surrounding inflammatory changes. Spleen: Normal in size without focal abnormality. Adrenals/Urinary Tract: Adrenal glands are within normal limits. Kidneys demonstrate a normal enhancement pattern. Nonobstructing upper pole left renal calculus is noted and stable measuring up to 9 mm. No obstructive changes are seen. The bladder is partially distended. Stomach/Bowel: Appendix is within normal limits. No obstructive or inflammatory changes of colon are noted. Small bowel and stomach are within normal limits. Previously seen small bowel dilatation has resolved. Vascular/Lymphatic: Aortic atherosclerosis. No enlarged abdominal or pelvic  lymph nodes. Reproductive: Prostate is unremarkable. Other: No abdominal wall hernia or abnormality. No abdominopelvic ascites. Musculoskeletal: No acute or significant osseous findings. IMPRESSION: Nonobstructing left renal stone is again seen and stable. Resolution of previously seen small bowel obstruction. No findings to correspond with the patient's given clinical history are seen. Electronically Signed   By: Oneil Devonshire M.D.   On: 01/18/2024 22:08     Discharge Exam: Vitals:   01/23/24 2013 01/24/24 0437  BP: 118/74 116/71  Pulse: 95 92  Resp: 20 18  Temp: 98.5 F (36.9 C)  98.1 F (36.7 C)  SpO2: 97% 97%   Vitals:   01/23/24 1148 01/23/24 1420 01/23/24 2013 01/24/24 0437  BP:  132/79 118/74 116/71  Pulse:  (!) 104 95 92  Resp:   20 18  Temp: 100.2 F (37.9 C) 99.4 F (37.4 C) 98.5 F (36.9 C) 98.1 F (36.7 C)  TempSrc: Oral Oral Oral   SpO2:  96% 97% 97%  Weight:      Height:        General: Pt is alert, awake, not in acute distress Cardiovascular: RRR, S1/S2 +, no rubs, no gallops Respiratory: CTA bilaterally, no wheezing, no rhonchi Abdominal: Soft, NT, ND, bowel sounds + Extremities: no edema, no cyanosis    The results of significant diagnostics from this hospitalization (including imaging, microbiology, ancillary and laboratory) are listed below for reference.     Microbiology: Recent Results (from the past 240 hours)  Resp panel by RT-PCR (RSV, Flu A&B, Covid) Anterior Nasal Swab     Status: None   Collection Time: 01/22/24  3:10 PM   Specimen: Anterior Nasal Swab  Result Value Ref Range Status   SARS Coronavirus 2 by RT PCR NEGATIVE NEGATIVE Final    Comment: (NOTE) SARS-CoV-2 target nucleic acids are NOT DETECTED.  The SARS-CoV-2 RNA is generally detectable in upper respiratory specimens during the acute phase of infection. The lowest concentration of SARS-CoV-2 viral copies this assay can detect is 138 copies/mL. A negative result does not preclude SARS-Cov-2 infection and should not be used as the sole basis for treatment or other patient management decisions. A negative result may occur with  improper specimen collection/handling, submission of specimen other than nasopharyngeal swab, presence of viral mutation(s) within the areas targeted by this assay, and inadequate number of viral copies(<138 copies/mL). A negative result must be combined with clinical observations, patient history, and epidemiological information. The expected result is Negative.  Fact Sheet for Patients:   bloggercourse.com  Fact Sheet for Healthcare Providers:  seriousbroker.it  This test is no t yet approved or cleared by the [place]  FDA and  has been authorized for detection and/or diagnosis of SARS-CoV-2 by FDA under an Emergency Use Authorization (EUA). This EUA will remain  in effect (meaning this test can be used) for the duration of the COVID-19 declaration under Section 564(b)(1) of the Act, 21 U.S.C.section 360bbb-3(b)(1), unless the authorization is terminated  or revoked sooner.       Influenza A by PCR NEGATIVE NEGATIVE Final   Influenza B by PCR NEGATIVE NEGATIVE Final    Comment: (NOTE) The Xpert Xpress SARS-CoV-2/FLU/RSV plus assay is intended as an aid in the diagnosis of influenza from Nasopharyngeal swab specimens and should not be used as a sole basis for treatment. Nasal washings and aspirates are unacceptable for Xpert Xpress SARS-CoV-2/FLU/RSV testing.  Fact Sheet for Patients: bloggercourse.com  Fact Sheet for Healthcare Providers: seriousbroker.it  This test is not yet approved  or cleared by the [place]  FDA and has been authorized for detection and/or diagnosis of SARS-CoV-2 by FDA under an Emergency Use Authorization (EUA). This EUA will remain in effect (meaning this test can be used) for the duration of the COVID-19 declaration under Section 564(b)(1) of the Act, 21 U.S.C. section 360bbb-3(b)(1), unless the authorization is terminated or revoked.     Resp Syncytial Virus by PCR NEGATIVE NEGATIVE Final    Comment: (NOTE) Fact Sheet for Patients: bloggercourse.com  Fact Sheet for Healthcare Providers: seriousbroker.it  This test is not yet approved or cleared by the [place]  FDA and has been authorized for detection and/or diagnosis of SARS-CoV-2 by FDA under an Emergency Use  Authorization (EUA). This EUA will remain in effect (meaning this test can be used) for the duration of the COVID-19 declaration under Section 564(b)(1) of the Act, 21 U.S.C. section 360bbb-3(b)(1), unless the authorization is terminated or revoked.  Performed at [facility], [address], [place], [place] [address]   Blood culture (routine x 2)     Status: None (Preliminary result)   Collection Time: 01/22/24  4:01 PM   Specimen: BLOOD  Result Value Ref Range Status   Specimen Description BLOOD BLOOD LEFT ARM  Final   Special Requests   Final    BOTTLES DRAWN AEROBIC AND ANAEROBIC Blood Culture adequate volume   Culture   Final    NO GROWTH 2 DAYS Performed at [facility], [address] [place], [place] [address]    Report Status PENDING  Incomplete  Blood culture (routine x 2)     Status: None (Preliminary result)   Collection Time: 01/22/24  4:01 PM   Specimen: BLOOD  Result Value Ref Range Status   Specimen Description BLOOD LFOA  Final   Special Requests   Final    Blood Culture adequate volume BOTTLES DRAWN AEROBIC AND ANAEROBIC   Culture   Final    NO GROWTH 2 DAYS Performed at [facility], [address] [place], [place] [address]    Report Status PENDING  Incomplete     Labs: BNP (last 3 results) No results for input(s): BNP in the last 8760 hours. Basic Metabolic Panel: Recent Labs  Lab 01/18/24 2047 01/22/24 1352 01/23/24 0341 01/24/24 0503  NA 137 135 136 136  K 3.6 3.7 3.6 3.7  CL 100 98 102 104  CO2 24 23 23 24   GLUCOSE 133* 86 80 106*  BUN 16 10 9  7*  CREATININE 1.09 1.01 0.95 0.98  CALCIUM  8.8* 8.7* 8.1* 8.5*  MG  --  1.9 1.7 2.0  PHOS  --   --  2.9  --    Liver Function Tests: Recent Labs  Lab 01/18/24 2047 01/22/24 1352 01/23/24 0341 01/24/24 0503  AST 27 25 18 19   ALT 22 17 13 15   ALKPHOS 68 76 62 65  BILITOT 0.7 0.3 0.7 0.5  PROT 7.2 7.1 5.7* 6.1*  ALBUMIN 3.0* 2.7* 2.2* 2.3*   Recent Labs  Lab 01/18/24 2047  01/22/24 1352  LIPASE 30 28   No results for input(s): AMMONIA in the last 168 hours. CBC: Recent Labs  Lab 01/18/24 2047 01/22/24 1352 01/23/24 0341 01/24/24 0503  WBC 9.3 7.6 6.2 5.4  NEUTROABS 7.2 4.9  --   --   HGB 10.2* 9.4* 8.6* 9.2*  HCT 35.5* 31.9* 29.5* 31.1*  MCV 86.4 86.2 84.5 84.3  PLT 300 231 218 232   Cardiac Enzymes: No results for input(s): CKTOTAL,  CKMB, CKMBINDEX, TROPONINI in the last 168 hours. BNP: Invalid input(s): POCBNP CBG: No results for input(s): GLUCAP in the last 168 hours. D-Dimer No results for input(s): DDIMER in the last 72 hours. Hgb A1c No results for input(s): HGBA1C in the last 72 hours. Lipid Profile No results for input(s): CHOL, HDL, LDLCALC, TRIG, CHOLHDL, LDLDIRECT in the last 72 hours. Thyroid  function studies No results for input(s): TSH, T4TOTAL, T3FREE, THYROIDAB in the last 72 hours.  Invalid input(s): FREET3 Anemia work up No results for input(s): VITAMINB12, FOLATE, FERRITIN, TIBC, IRON, RETICCTPCT in the last 72 hours. Urinalysis    Component Value Date/Time   COLORURINE YELLOW 01/22/2024 1758   APPEARANCEUR CLEAR 01/22/2024 1758   APPEARANCEUR Clear 12/11/2017 1117   LABSPEC 1.020 01/22/2024 1758   PHURINE 5.0 01/22/2024 1758   GLUCOSEU NEGATIVE 01/22/2024 1758   HGBUR NEGATIVE 01/22/2024 1758   BILIRUBINUR NEGATIVE 01/22/2024 1758   BILIRUBINUR Negative 12/11/2017 1117   KETONESUR 20 (A) 01/22/2024 1758   PROTEINUR 30 (A) 01/22/2024 1758   UROBILINOGEN negative 07/13/2014 1059   NITRITE NEGATIVE 01/22/2024 1758   LEUKOCYTESUR NEGATIVE 01/22/2024 1758   Sepsis Labs Recent Labs  Lab 01/18/24 2047 01/22/24 1352 01/23/24 0341 01/24/24 0503  WBC 9.3 7.6 6.2 5.4   Microbiology Recent Results (from the past 240 hours)  Resp panel by RT-PCR (RSV, Flu A&B, Covid) Anterior Nasal Swab     Status: None   Collection Time: 01/22/24  3:10 PM   Specimen: Anterior  Nasal Swab  Result Value Ref Range Status   SARS Coronavirus 2 by RT PCR NEGATIVE NEGATIVE Final    Comment: (NOTE) SARS-CoV-2 target nucleic acids are NOT DETECTED.  The SARS-CoV-2 RNA is generally detectable in upper respiratory specimens during the acute phase of infection. The lowest concentration of SARS-CoV-2 viral copies this assay can detect is 138 copies/mL. A negative result does not preclude SARS-Cov-2 infection and should not be used as the sole basis for treatment or other patient management decisions. A negative result may occur with  improper specimen collection/handling, submission of specimen other than nasopharyngeal swab, presence of viral mutation(s) within the areas targeted by this assay, and inadequate number of viral copies(<138 copies/mL). A negative result must be combined with clinical observations, patient history, and epidemiological information. The expected result is Negative.  Fact Sheet for Patients:  bloggercourse.com  Fact Sheet for Healthcare Providers:  seriousbroker.it  This test is no t yet approved or cleared by the [place]  FDA and  has been authorized for detection and/or diagnosis of SARS-CoV-2 by FDA under an Emergency Use Authorization (EUA). This EUA will remain  in effect (meaning this test can be used) for the duration of the COVID-19 declaration under Section 564(b)(1) of the Act, 21 U.S.C.section 360bbb-3(b)(1), unless the authorization is terminated  or revoked sooner.       Influenza A by PCR NEGATIVE NEGATIVE Final   Influenza B by PCR NEGATIVE NEGATIVE Final    Comment: (NOTE) The Xpert Xpress SARS-CoV-2/FLU/RSV plus assay is intended as an aid in the diagnosis of influenza from Nasopharyngeal swab specimens and should not be used as a sole basis for treatment. Nasal washings and aspirates are unacceptable for Xpert Xpress SARS-CoV-2/FLU/RSV testing.  Fact Sheet for  Patients: bloggercourse.com  Fact Sheet for Healthcare Providers: seriousbroker.it  This test is not yet approved or cleared by the [place]  FDA and has been authorized for detection and/or diagnosis of SARS-CoV-2 by FDA under an Emergency Use Authorization (EUA). This  EUA will remain in effect (meaning this test can be used) for the duration of the COVID-19 declaration under Section 564(b)(1) of the Act, 21 U.S.C. section 360bbb-3(b)(1), unless the authorization is terminated or revoked.     Resp Syncytial Virus by PCR NEGATIVE NEGATIVE Final    Comment: (NOTE) Fact Sheet for Patients: bloggercourse.com  Fact Sheet for Healthcare Providers: seriousbroker.it  This test is not yet approved or cleared by the [place]  FDA and has been authorized for detection and/or diagnosis of SARS-CoV-2 by FDA under an Emergency Use Authorization (EUA). This EUA will remain in effect (meaning this test can be used) for the duration of the COVID-19 declaration under Section 564(b)(1) of the Act, 21 U.S.C. section 360bbb-3(b)(1), unless the authorization is terminated or revoked.  Performed at [facility], [address], [place], [place] [address]   Blood culture (routine x 2)     Status: None (Preliminary result)   Collection Time: 01/22/24  4:01 PM   Specimen: BLOOD  Result Value Ref Range Status   Specimen Description BLOOD BLOOD LEFT ARM  Final   Special Requests   Final    BOTTLES DRAWN AEROBIC AND ANAEROBIC Blood Culture adequate volume   Culture   Final    NO GROWTH 2 DAYS Performed at [facility], [address], [place], [place] [address]    Report Status PENDING  Incomplete  Blood culture (routine x 2)     Status: None (Preliminary result)   Collection Time: 01/22/24  4:01 PM   Specimen: BLOOD  Result Value Ref Range Status   Specimen Description BLOOD LFOA  Final    Special Requests   Final    Blood Culture adequate volume BOTTLES DRAWN AEROBIC AND ANAEROBIC   Culture   Final    NO GROWTH 2 DAYS Performed at [facility], [address], [place], [place] [address]    Report Status PENDING  Incomplete     Time coordinating discharge: 35 minutes  SIGNED:   Adron JONETTA Fairly, DO [facility] 01/24/2024, 10:26 AM  If 7PM-7AM, please contact night-coverage [email or web address]

## 2024-01-24 NOTE — Progress Notes (Signed)
 Patient discharged home , IV removed from RFA. Verbalized understanding of all medications and follow up appointments.

## 2024-01-24 NOTE — Progress Notes (Signed)
 OT Cancellation Note  Patient Details Name: Bradley Goodman MRN: [id number] DOB: [birth year]   Cancelled Treatment:    Reason Eval/Treat Not Completed: OT screened, no needs identified, will sign off. Patient ambulating independently in room per PT note. Pt does not appear to be in need of an OT evaluation at this time.   Rowan Pollman OT, MOT   Thurnell Floss 01/24/2024, 8:00 AM

## 2024-01-24 NOTE — Plan of Care (Signed)
   Problem: Education: Goal: Knowledge of General Education information will improve Description Including pain rating scale, medication(s)/side effects and non-pharmacologic comfort measures Outcome: Progressing   Problem: Health Behavior/Discharge Planning: Goal: Ability to manage health-related needs will improve Outcome: Progressing

## 2024-01-27 ENCOUNTER — Encounter (HOSPITAL_COMMUNITY): Payer: Self-pay

## 2024-01-27 ENCOUNTER — Ambulatory Visit (HOSPITAL_COMMUNITY)
Admission: RE | Admit: 2024-01-27 | Discharge: 2024-01-27 | Disposition: A | Discharge status: Home / Self Care | Source: Ambulatory Visit | Attending: Internal Medicine | Admitting: Internal Medicine

## 2024-01-27 DIAGNOSIS — J439 Emphysema, unspecified: Secondary | ICD-10-CM | POA: Diagnosis not present

## 2024-01-27 DIAGNOSIS — I700 Atherosclerosis of aorta: Secondary | ICD-10-CM | POA: Diagnosis not present

## 2024-01-27 DIAGNOSIS — C3490 Malignant neoplasm of unspecified part of unspecified bronchus or lung: Secondary | ICD-10-CM | POA: Diagnosis not present

## 2024-01-27 DIAGNOSIS — I3139 Other pericardial effusion (noninflammatory): Secondary | ICD-10-CM | POA: Diagnosis not present

## 2024-01-27 LAB — CULTURE, BLOOD (ROUTINE X 2)
Culture: NO GROWTH
Culture: NO GROWTH
Special Requests: ADEQUATE
Special Requests: ADEQUATE

## 2024-01-27 MED ORDER — IOHEXOL 300 MG/ML  SOLN
75.0000 mL | Freq: Once | INTRAMUSCULAR | Status: AC | PRN
  Administered 2024-01-27: 75 mL via INTRAVENOUS

## 2024-01-27 MED ORDER — SODIUM CHLORIDE (PF) 0.9 % IJ SOLN
INTRAMUSCULAR | Status: AC
  Filled 2024-01-27: qty 50

## 2024-01-28 ENCOUNTER — Other Ambulatory Visit: Payer: Self-pay

## 2024-01-28 ENCOUNTER — Other Ambulatory Visit (HOSPITAL_COMMUNITY): Payer: Self-pay

## 2024-01-28 ENCOUNTER — Telehealth: Payer: Self-pay | Admitting: Family Medicine

## 2024-01-28 ENCOUNTER — Encounter: Payer: Self-pay | Admitting: Family Medicine

## 2024-01-28 DIAGNOSIS — Z0279 Encounter for issue of other medical certificate: Secondary | ICD-10-CM

## 2024-01-28 NOTE — Telephone Encounter (Signed)
 Received FMLA paperwork for patient via fax. Left message for patient to call back to pay $29 form fee before paperwork can be started on. Will put paperwork in a sleeve and in lou ann's office.

## 2024-01-28 NOTE — Telephone Encounter (Signed)
 Pt wife dropped off FMLA forms to be completed and signed.  Form Fee Paid? (Yes)            If NO, form is placed on front office manager desk to hold until payment received. If YES, then form will be placed in the RX/HH Nurse Coordinators box for completion.  Form will not be processed until payment is received

## 2024-01-28 NOTE — Progress Notes (Deleted)
 [facility] OFFICE PROGRESS NOTE  [name], FNP [address] [place] [address]  DIAGNOSIS: ***  PRIOR THERAPY:  CURRENT THERAPY:  INTERVAL HISTORY: Bradley Goodman 68 y.o. 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  MEDICATIONS:  Current Outpatient Medications  Medication Sig Dispense Refill   acetaminophen  (TYLENOL ) 500 MG tablet Take 1,000 mg by mouth every 6 (six) hours as needed for mild pain (pain score 1-3) or fever.     chlorproMAZINE  (THORAZINE ) 10 MG tablet Take 1 tablet (10 mg total) by mouth 3 (three) times daily as needed for hiccoughs. 90 tablet 0   cholecalciferol  (VITAMIN D ) 1000 UNITS tablet Take 1,000 Units by mouth every evening.     Continuous Glucose Sensor (FREESTYLE LIBRE 3 SENSOR) MISC Place 1 sensor on the skin every 14 days. Use to check glucose continuously 6 each 3   dexamethasone  (DECADRON ) 1 MG tablet Take 1 tablet (1 mg total) by mouth daily with breakfast. 90 tablet 1   docusate sodium  (COLACE) 100 MG capsule Take 100 mg by mouth daily.     feeding supplement (ENSURE ENLIVE / ENSURE PLUS) LIQD Take 237 mLs by mouth 2 (two) times daily between meals. 237 mL 12   folic acid  (FOLVITE ) 1 MG tablet Take 1 tablet (1 mg total) by mouth daily. 90 tablet 0   Krill Oil 300 MG CAPS Take 300 mg by mouth every evening.     levothyroxine  (SYNTHROID ) 88 MCG tablet Take 1 tablet (88 mcg total) by mouth daily before breakfast. 90 tablet 1   loratadine  (CLARITIN ) 10 MG tablet Take 10 mg by mouth every evening.      losartan  (COZAAR ) 50  MG tablet Take 1.5 tablets (75 mg total) by mouth daily. 135 tablet 0   mirtazapine  (REMERON ) 15 MG tablet Take 1 tablet (15 mg total) by mouth at bedtime. 90 tablet 1   Multiple Vitamins-Minerals (MULTIVITAMIN GUMMIES ADULT PO) Take 2 each by mouth daily.     ondansetron  (ZOFRAN ) 4 MG tablet Take 1 tablet (4 mg total) by mouth every 6 (six) hours as needed for nausea. 20 tablet 0   pantoprazole  (PROTONIX ) 40 MG tablet Take 1 tablet (40 mg total) by mouth every evening. 90 tablet 1   PRESCRIPTION MEDICATION Testosterone  injection     rosuvastatin  (CRESTOR ) 20 MG tablet Take 1 tablet (20 mg total) by mouth daily. 90 tablet 1   SYRINGE-NEEDLE, DISP, 3 ML (B-D 3CC LUER-LOK SYR 21GX1-1/2) 21G X 1-1/2" 3 ML MISC Use to inject testosterone  every week 100 each 2   testosterone  cypionate (DEPOTESTOTERONE CYPIONATE) 100 MG/ML injection Take 50mg  ( 0.65ml) and 100mg  ( 1ml) into muscle alternatively weekly. 10 mL 1   venlafaxine  XR (EFFEXOR -XR) 75 MG 24 hr capsule Take 2 capsules (150 mg total) by mouth daily with breakfast. 180 capsule 2   No current facility-administered medications for this visit.    SURGICAL HISTORY:  Past Surgical History:  Procedure Laterality Date  Bilateral inguinal hernia repair     BRONCHIAL NEEDLE ASPIRATION BIOPSY  04/05/2021   Procedure: BRONCHIAL NEEDLE ASPIRATION BIOPSIES;  Surgeon: Prudy Brownie, DO;  Location: [facility];  Service: Pulmonary;;   COLONOSCOPY  01/23/2012   Procedure: COLONOSCOPY;  Surgeon: Suzette Espy, MD;  Location: [facility];  Service: Endoscopy;  Laterality: N/A;  9:30 AM   COLONOSCOPY N/A 10/11/2015   Procedure: COLONOSCOPY;  Surgeon: Suzette Espy, MD;  Location: [facility];  Service: Endoscopy;  Laterality: N/A;  830   COLONOSCOPY N/A 10/14/2019   Procedure: COLONOSCOPY;  Surgeon: Suzette Espy, MD;  Location: [facility];  Service: Endoscopy;  Laterality: N/A;  1:45   POLYPECTOMY  10/14/2019   Procedure: POLYPECTOMY;  Surgeon: Suzette Espy, MD;  Location: [facility];  Service: Endoscopy;;  ascending colon, descending colon   VIDEO BRONCHOSCOPY WITH ENDOBRONCHIAL ULTRASOUND N/A 04/05/2021   Procedure: VIDEO BRONCHOSCOPY WITH ENDOBRONCHIAL ULTRASOUND;  Surgeon: Prudy Brownie, DO;  Location: [facility];  Service: Pulmonary;  Laterality: N/A;    REVIEW OF SYSTEMS:   Review of Systems  Constitutional: Negative for appetite change, chills, fatigue, fever and unexpected weight change.  HENT:   Negative for mouth sores, nosebleeds, sore throat and trouble swallowing.   Eyes: Negative for eye problems and icterus.  Respiratory: Negative for cough, hemoptysis, shortness of breath and wheezing.   Cardiovascular: Negative for chest pain and leg swelling.  Gastrointestinal: Negative for abdominal pain, constipation, diarrhea, nausea and vomiting.  Genitourinary: Negative for bladder incontinence, difficulty urinating, dysuria, frequency and hematuria.   Musculoskeletal: Negative for back pain, gait problem, neck pain and neck stiffness.  Skin: Negative for itching and rash.  Neurological: Negative for dizziness, extremity weakness, gait problem, headaches, light-headedness and seizures.  Hematological: Negative for adenopathy. Does not bruise/bleed easily.  Psychiatric/Behavioral: Negative for confusion, depression and sleep disturbance. The patient is not nervous/anxious.     PHYSICAL EXAMINATION:  There were no vitals taken for this visit.  ECOG PERFORMANCE STATUS: {CHL ONC ECOG [id number]  Physical Exam  Constitutional: Oriented to person, place, and time and well-developed, well-nourished, and in no distress. No distress.  HENT:  Head: Normocephalic and atraumatic.  Mouth/Throat: Oropharynx is clear and moist. No oropharyngeal exudate.  Eyes: Conjunctivae are normal. Right eye exhibits no discharge. Left eye exhibits no discharge. No scleral icterus.  Neck: Normal range of motion. Neck supple.   Cardiovascular: Normal rate, regular rhythm, normal heart sounds and intact distal pulses.   Pulmonary/Chest: Effort normal and breath sounds normal. No respiratory distress. No wheezes. No rales.  Abdominal: Soft. Bowel sounds are normal. Exhibits no distension and no mass. There is no tenderness.  Musculoskeletal: Normal range of motion. Exhibits no edema.  Lymphadenopathy:    No cervical adenopathy.  Neurological: Alert and oriented to person, place, and time. Exhibits normal muscle tone. Gait normal. Coordination normal.  Skin: Skin is warm and dry. No rash noted. Not diaphoretic. No erythema. No pallor.  Psychiatric: Mood, memory and judgment normal.  Vitals reviewed.  LABORATORY DATA: Lab Results  Component Value Date   WBC 5.4 01/24/2024   HGB 9.2 (L) 01/24/2024   HCT 31.1 (L) 01/24/2024   MCV 84.3 01/24/2024   PLT 232 01/24/2024      Chemistry      Component Value Date/Time   NA 136 01/24/2024 0503   NA 144 11/26/2023 1135   K 3.7 01/24/2024 0503   CL 104 01/24/2024 0503   CO2  24 01/24/2024 0503   BUN 7 (L) 01/24/2024 0503   BUN 11 11/26/2023 1135   CREATININE 0.98 01/24/2024 0503   CREATININE 0.98 01/13/2024 1327      Component Value Date/Time   CALCIUM  8.5 (L) 01/24/2024 0503   ALKPHOS 65 01/24/2024 0503   AST 19 01/24/2024 0503   AST 17 01/13/2024 1327   ALT 15 01/24/2024 0503   ALT 15 01/13/2024 1327   BILITOT 0.5 01/24/2024 0503   BILITOT 0.2 01/13/2024 1327       RADIOGRAPHIC STUDIES:  DG Chest 2 View Result Date: 01/22/2024 CLINICAL DATA:  Cough, fever EXAM: CHEST - 2 VIEW COMPARISON:  11/20/2023 FINDINGS: Heart and mediastinal contours are within normal limits. No focal opacities or effusions. No acute bony abnormality. IMPRESSION: No active cardiopulmonary disease. Electronically Signed   By: Janeece Mechanic M.D.   On: 01/22/2024 19:11   CT ABDOMEN PELVIS W CONTRAST Result Date: 01/18/2024 CLINICAL DATA:  Right lower quadrant pain EXAM: CT ABDOMEN  AND PELVIS WITH CONTRAST TECHNIQUE: Multidetector CT imaging of the abdomen and pelvis was performed using the standard protocol following bolus administration of intravenous contrast. RADIATION DOSE REDUCTION: This exam was performed according to the departmental dose-optimization program which includes automated exposure control, adjustment of the mA and/or kV according to patient size and/or use of iterative reconstruction technique. CONTRAST:  OMNIPAQUE  IOHEXOL  300 MG/ML  SOLN COMPARISON:  11/18/2023 FINDINGS: Lower chest: No acute abnormality. Hepatobiliary: No focal liver abnormality is seen. No gallstones, gallbladder wall thickening, or biliary dilatation. Stable small hepatic cyst is noted in the right lobe. Pancreas: Unremarkable. No pancreatic ductal dilatation or surrounding inflammatory changes. Spleen: Normal in size without focal abnormality. Adrenals/Urinary Tract: Adrenal glands are within normal limits. Kidneys demonstrate a normal enhancement pattern. Nonobstructing upper pole left renal calculus is noted and stable measuring up to 9 mm. No obstructive changes are seen. The bladder is partially distended. Stomach/Bowel: Appendix is within normal limits. No obstructive or inflammatory changes of colon are noted. Small bowel and stomach are within normal limits. Previously seen small bowel dilatation has resolved. Vascular/Lymphatic: Aortic atherosclerosis. No enlarged abdominal or pelvic lymph nodes. Reproductive: Prostate is unremarkable. Other: No abdominal wall hernia or abnormality. No abdominopelvic ascites. Musculoskeletal: No acute or significant osseous findings. IMPRESSION: Nonobstructing left renal stone is again seen and stable. Resolution of previously seen small bowel obstruction. No findings to correspond with the patient's given clinical history are seen. Electronically Signed   By: Violeta Grey M.D.   On: 01/18/2024 22:08     ASSESSMENT/PLAN:  No problem-specific  Assessment & Plan notes found for this encounter.   No orders of the defined types were placed in this encounter.    I spent {CHL ONC TIME VISIT - [id number]} counseling the patient face to face. The total time spent in the appointment was {CHL ONC TIME VISIT - [id number]}.  Anuhea Gassner L Adelis Docter, PA-C 01/28/24

## 2024-01-29 ENCOUNTER — Telehealth: Payer: Self-pay | Admitting: Internal Medicine

## 2024-01-29 NOTE — Telephone Encounter (Signed)
 PCP completed and signed FMLA forms. They have been faxed to [employer] at fax number [phone] & [phone]. Patient has been contacted and informed they are complete.Copies at front desk

## 2024-01-29 NOTE — Telephone Encounter (Signed)
 Rescheduled appointments and the patient is aware of the changes made.

## 2024-01-30 ENCOUNTER — Encounter: Payer: Self-pay | Admitting: *Deleted

## 2024-01-31 ENCOUNTER — Other Ambulatory Visit: Payer: Self-pay

## 2024-02-03 ENCOUNTER — Other Ambulatory Visit: Payer: Commercial Managed Care - PPO

## 2024-02-03 ENCOUNTER — Inpatient Hospital Stay

## 2024-02-03 ENCOUNTER — Ambulatory Visit: Payer: Commercial Managed Care - PPO

## 2024-02-03 ENCOUNTER — Inpatient Hospital Stay (HOSPITAL_BASED_OUTPATIENT_CLINIC_OR_DEPARTMENT_OTHER): Admitting: Internal Medicine

## 2024-02-03 ENCOUNTER — Ambulatory Visit: Payer: Commercial Managed Care - PPO | Admitting: Physician Assistant

## 2024-02-03 VITALS — BP 145/89 | HR 88 | Temp 97.5°F | Resp 16 | Ht 71.0 in | Wt 178.7 lb

## 2024-02-03 DIAGNOSIS — Z79899 Other long term (current) drug therapy: Secondary | ICD-10-CM | POA: Diagnosis not present

## 2024-02-03 DIAGNOSIS — C3411 Malignant neoplasm of upper lobe, right bronchus or lung: Secondary | ICD-10-CM | POA: Diagnosis not present

## 2024-02-03 DIAGNOSIS — C3491 Malignant neoplasm of unspecified part of right bronchus or lung: Secondary | ICD-10-CM

## 2024-02-03 DIAGNOSIS — C7951 Secondary malignant neoplasm of bone: Secondary | ICD-10-CM | POA: Diagnosis not present

## 2024-02-03 DIAGNOSIS — C7931 Secondary malignant neoplasm of brain: Secondary | ICD-10-CM | POA: Diagnosis not present

## 2024-02-03 DIAGNOSIS — Z5112 Encounter for antineoplastic immunotherapy: Secondary | ICD-10-CM | POA: Diagnosis not present

## 2024-02-03 DIAGNOSIS — Z5111 Encounter for antineoplastic chemotherapy: Secondary | ICD-10-CM | POA: Diagnosis not present

## 2024-02-03 LAB — CBC WITH DIFFERENTIAL (CANCER CENTER ONLY)
Abs Immature Granulocytes: 0.09 10*3/uL — ABNORMAL HIGH (ref 0.00–0.07)
Basophils Absolute: 0.1 10*3/uL (ref 0.0–0.1)
Basophils Relative: 1 %
Eosinophils Absolute: 0 10*3/uL (ref 0.0–0.5)
Eosinophils Relative: 0 %
HCT: 37.9 % — ABNORMAL LOW (ref 39.0–52.0)
Hemoglobin: 11.3 g/dL — ABNORMAL LOW (ref 13.0–17.0)
Immature Granulocytes: 1 %
Lymphocytes Relative: 15 %
Lymphs Abs: 1.4 10*3/uL (ref 0.7–4.0)
MCH: 25.6 pg — ABNORMAL LOW (ref 26.0–34.0)
MCHC: 29.8 g/dL — ABNORMAL LOW (ref 30.0–36.0)
MCV: 85.7 fL (ref 80.0–100.0)
Monocytes Absolute: 0.3 10*3/uL (ref 0.1–1.0)
Monocytes Relative: 3 %
Neutro Abs: 7.6 10*3/uL (ref 1.7–7.7)
Neutrophils Relative %: 80 %
Platelet Count: 616 10*3/uL — ABNORMAL HIGH (ref 150–400)
RBC: 4.42 MIL/uL (ref 4.22–5.81)
RDW: 20.7 % — ABNORMAL HIGH (ref 11.5–15.5)
WBC Count: 9.6 10*3/uL (ref 4.0–10.5)
nRBC: 0 % (ref 0.0–0.2)

## 2024-02-03 LAB — CMP (CANCER CENTER ONLY)
ALT: 16 U/L (ref 0–44)
AST: 19 U/L (ref 15–41)
Albumin: 3.9 g/dL (ref 3.5–5.0)
Alkaline Phosphatase: 66 U/L (ref 38–126)
Anion gap: 5 (ref 5–15)
BUN: 12 mg/dL (ref 8–23)
CO2: 29 mmol/L (ref 22–32)
Calcium: 9 mg/dL (ref 8.9–10.3)
Chloride: 108 mmol/L (ref 98–111)
Creatinine: 0.91 mg/dL (ref 0.61–1.24)
GFR, Estimated: >60 mL/min
Glucose, Bld: 102 mg/dL — ABNORMAL HIGH (ref 70–99)
Potassium: 4.2 mmol/L (ref 3.5–5.1)
Sodium: 142 mmol/L (ref 135–145)
Total Bilirubin: 0.2 mg/dL (ref 0.0–1.2)
Total Protein: 7.1 g/dL (ref 6.5–8.1)

## 2024-02-03 MED ORDER — SODIUM CHLORIDE 0.9 % IV SOLN
Freq: Once | INTRAVENOUS | Status: AC
Start: 2024-02-03 — End: 2024-02-03

## 2024-02-03 MED ORDER — PROCHLORPERAZINE MALEATE 10 MG PO TABS
10.0000 mg | ORAL_TABLET | Freq: Once | ORAL | Status: AC
  Administered 2024-02-03: 10 mg via ORAL
  Filled 2024-02-03: qty 1

## 2024-02-03 MED ORDER — SODIUM CHLORIDE 0.9 % IV SOLN
200.0000 mg | Freq: Once | INTRAVENOUS | Status: AC
  Administered 2024-02-03: 200 mg via INTRAVENOUS
  Filled 2024-02-03: qty 200

## 2024-02-03 MED ORDER — SODIUM CHLORIDE 0.9 % IV SOLN
500.0000 mg/m2 | Freq: Once | INTRAVENOUS | Status: AC
  Administered 2024-02-03: 1000 mg via INTRAVENOUS
  Filled 2024-02-03: qty 40

## 2024-02-03 NOTE — Progress Notes (Signed)
 [facility] [phone]   [phone]  OFFICE PROGRESS NOTE  [name], FNP [address] [place] [address]  DIAGNOSIS: Stage IV (T1b, N3, M1C) non-small cell lung cancer, favoring adenocarcinoma presented with right upper lobe lung nodule in addition to right hilar, subcarinal and bilateral mediastinal as well as supraclavicular lymphadenopathy in addition to bone and brain metastasis diagnosed in June 2022.     PD-L1 expression 80%.     Molecular Studies:  Biomarker Findings Microsatellite status - MS-Stable Tumor Mutational Burden - 6 Muts/Mb Genomic Findings For a complete list of the genes assayed, please refer to the Appendix. KRAS G12C, amplification ATM S470* CCND1 amplification - equivocal? HGF amplification - equivocal? MYC amplification - equivocal? FGF19 amplification - equivocal? FGF3 amplification - equivocal? FGF4 amplification - equivocal? NFKBIA amplification NKX2-1 amplification RAD21 amplification - equivocal? [id number]*26 TERT promoter -124C>T TP53 rearrangement exon 9 7 Disease relevant genes with no reportable alterations: ALK, BRAF, EGFR, ERBB2, MET, RET, ROS1   PRIOR THERAPY: SRS to the metastatic brain lesions under the care of Dr. Lorri Rota.  Last treatment on 05/04/2021.   CURRENT THERAPY: Palliative systemic chemotherapy with carboplatin  for an AUC 5, Alimta  500 mg/m2 and, Keytruda  200 mg IV every 3 weeks.  First dose on 05/08/2021.  Status post 46 cycles.  Starting from cycle #5 he is on maintenance treatment with Alimta  and Keytruda  every 3 weeks.  INTERVAL HISTORY: Bradley Goodman 68 y.o. male returns to the clinic today for follow-up visit accompanied by his wife.Discussed the use of AI scribe software for clinical note transcription with the patient, who gave verbal consent to proceed.  History of Present Illness   Bradley Goodman is a 68 year old male with adenocarcinoma who presents  for evaluation before starting cycle number forty-seven of maintenance treatment.  He has a history of adenocarcinoma diagnosed in 2022, with 80% expression and a KRAS G12C mutation. Initially treated with carboplatin , Alimta , and Keytruda  for four cycles, he has been on maintenance treatment with Alimta  and Keytruda  every three weeks since cycle five. He is currently post a total of forty-six cycles and is here for evaluation before starting cycle number forty-seven, including a repeat CT scan of the chest for restaging of his disease.  Recently, he experienced bowel obstructions and was hospitalized a week before last. A CT scan of the abdomen was performed, and results were reportedly good. He had no significant history of constipation prior to the obstruction, only abdominal pain and tension. He worked a twelve-hour day prior to the obstruction.  No current nausea, vomiting, chest pain, or breathing issues. He has not had a recent colonoscopy, but it is not yet due.       MEDICAL HISTORY: 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   PONV (postoperative nausea and vomiting)    Type 2 diabetes mellitus (HCC)     ALLERGIES:  has no known allergies.  MEDICATIONS:  Current Outpatient Medications  Medication Sig Dispense Refill   acetaminophen  (TYLENOL ) 500 MG tablet Take 1,000 mg by mouth every 6 (six) hours as needed for mild pain (pain score 1-3) or fever.     chlorproMAZINE  (THORAZINE ) 10 MG  tablet Take 1 tablet (10 mg total) by mouth 3 (three) times daily as needed for hiccoughs. 90 tablet 0   cholecalciferol  (VITAMIN D ) 1000 UNITS tablet Take 1,000 Units by mouth every evening.     Continuous Glucose Sensor (FREESTYLE LIBRE 3 SENSOR) MISC Place 1 sensor on the skin every 14  days. Use to check glucose continuously 6 each 3   dexamethasone  (DECADRON ) 1 MG tablet Take 1 tablet (1 mg total) by mouth daily with breakfast. 90 tablet 1   docusate sodium  (COLACE) 100 MG capsule Take 100 mg by mouth daily.     feeding supplement (ENSURE ENLIVE / ENSURE PLUS) LIQD Take 237 mLs by mouth 2 (two) times daily between meals. 237 mL 12   folic acid  (FOLVITE ) 1 MG tablet Take 1 tablet (1 mg total) by mouth daily. 90 tablet 0   Krill Oil 300 MG CAPS Take 300 mg by mouth every evening.     levothyroxine  (SYNTHROID ) 88 MCG tablet Take 1 tablet (88 mcg total) by mouth daily before breakfast. 90 tablet 1   loratadine  (CLARITIN ) 10 MG tablet Take 10 mg by mouth every evening.      losartan  (COZAAR ) 50 MG tablet Take 1.5 tablets (75 mg total) by mouth daily. 135 tablet 0   mirtazapine  (REMERON ) 15 MG tablet Take 1 tablet (15 mg total) by mouth at bedtime. 90 tablet 1   Multiple Vitamins-Minerals (MULTIVITAMIN GUMMIES ADULT PO) Take 2 each by mouth daily.     ondansetron  (ZOFRAN ) 4 MG tablet Take 1 tablet (4 mg total) by mouth every 6 (six) hours as needed for nausea. 20 tablet 0   pantoprazole  (PROTONIX ) 40 MG tablet Take 1 tablet (40 mg total) by mouth every evening. 90 tablet 1   PRESCRIPTION MEDICATION Testosterone  injection     rosuvastatin  (CRESTOR ) 20 MG tablet Take 1 tablet (20 mg total) by mouth daily. 90 tablet 1   SYRINGE-NEEDLE, DISP, 3 ML (B-D 3CC LUER-LOK SYR 21GX1-1/2) 21G X 1-1/2" 3 ML MISC Use to inject testosterone  every week 100 each 2   testosterone  cypionate (DEPOTESTOTERONE CYPIONATE) 100 MG/ML injection Take 50mg  ( 0.61ml) and 100mg  ( 1ml) into muscle alternatively weekly. 10 mL 1   venlafaxine  XR (EFFEXOR -XR) 75 MG 24 hr capsule Take 2 capsules (150 mg total) by mouth daily with breakfast. 180 capsule 2   No current facility-administered medications for this visit.    SURGICAL HISTORY:  Past Surgical History:  Procedure Laterality Date   Bilateral inguinal hernia  repair     BRONCHIAL NEEDLE ASPIRATION BIOPSY  04/05/2021   Procedure: BRONCHIAL NEEDLE ASPIRATION BIOPSIES;  Surgeon: Prudy Brownie, DO;  Location: [facility];  Service: Pulmonary;;   COLONOSCOPY  01/23/2012   Procedure: COLONOSCOPY;  Surgeon: Suzette Espy, MD;  Location: [facility];  Service: Endoscopy;  Laterality: N/A;  9:30 AM   COLONOSCOPY N/A 10/11/2015   Procedure: COLONOSCOPY;  Surgeon: Suzette Espy, MD;  Location: [facility];  Service: Endoscopy;  Laterality: N/A;  830   COLONOSCOPY N/A 10/14/2019   Procedure: COLONOSCOPY;  Surgeon: Suzette Espy, MD;  Location: [facility];  Service: Endoscopy;  Laterality: N/A;  1:45   POLYPECTOMY  10/14/2019   Procedure: POLYPECTOMY;  Surgeon: Suzette Espy, MD;  Location: [facility];  Service: Endoscopy;;  ascending colon, descending colon   VIDEO BRONCHOSCOPY WITH ENDOBRONCHIAL ULTRASOUND N/A 04/05/2021   Procedure: VIDEO BRONCHOSCOPY WITH ENDOBRONCHIAL ULTRASOUND;  Surgeon: Prudy Brownie, DO;  Location: [facility]  ENDOS[facility];  Service: Pulmonary;  Laterality: N/A;    REVIEW OF SYSTEMS:  Constitutional: negative Eyes: negative Ears, nose, mouth, throat, and face: negative Respiratory: negative Cardiovascular: negative Gastrointestinal: negative Genitourinary:negative Integument/breast: negative Hematologic/lymphatic: negative Musculoskeletal:negative Neurological: negative Behavioral/Psych: negative Endocrine: negative Allergic/Immunologic: negative   PHYSICAL EXAMINATION: General appearance: alert, cooperative, appears stated age, and no distress Head: Normocephalic, without obvious abnormality, atraumatic Neck: no adenopathy, no JVD, supple, symmetrical, trachea midline, and thyroid  not enlarged, symmetric, no tenderness/mass/nodules Lymph nodes: Cervical, supraclavicular, and axillary nodes normal. Resp: clear to auscultation bilaterally Back: symmetric, no curvature. ROM normal. No CVA tenderness. Cardio: regular rate  and rhythm, S1, S2 normal, no murmur, click, rub or gallop GI: soft, non-tender; bowel sounds normal; no masses,  no organomegaly Extremities: extremities normal, atraumatic, no cyanosis or edema Neurologic: Alert and oriented X 3, normal strength and tone. Normal symmetric reflexes. Normal coordination and gait  ECOG PERFORMANCE STATUS: 1 - Symptomatic but completely ambulatory  Blood pressure (!) 145/89, pulse 88, temperature (!) 97.5 F (36.4 C), temperature source Temporal, resp. rate 16, height 5\' 11"  (1.803 m), weight 178 lb 11.2 oz (81.1 kg), SpO2 99%.  LABORATORY DATA: Lab Results  Component Value Date   WBC 9.6 02/03/2024   HGB 11.3 (L) 02/03/2024   HCT 37.9 (L) 02/03/2024   MCV 85.7 02/03/2024   PLT 616 (H) 02/03/2024      Chemistry      Component Value Date/Time   NA 136 01/24/2024 0503   NA 144 11/26/2023 1135   K 3.7 01/24/2024 0503   CL 104 01/24/2024 0503   CO2 24 01/24/2024 0503   BUN 7 (L) 01/24/2024 0503   BUN 11 11/26/2023 1135   CREATININE 0.98 01/24/2024 0503   CREATININE 0.98 01/13/2024 1327      Component Value Date/Time   CALCIUM  8.5 (L) 01/24/2024 0503   ALKPHOS 65 01/24/2024 0503   AST 19 01/24/2024 0503   AST 17 01/13/2024 1327   ALT 15 01/24/2024 0503   ALT 15 01/13/2024 1327   BILITOT 0.5 01/24/2024 0503   BILITOT 0.2 01/13/2024 1327       RADIOGRAPHIC STUDIES: CT Chest W Contrast Result Date: 01/31/2024 CLINICAL DATA:  Staging non-small-cell lung cancer. Diagnosed in 2022. Chemotherapy and immunotherapy in progress. Completed XRT. * Tracking Code: BO * EXAM: CT CHEST WITH CONTRAST TECHNIQUE: Multidetector CT imaging of the chest was performed during intravenous contrast administration. RADIATION DOSE REDUCTION: This exam was performed according to the departmental dose-optimization program which includes automated exposure control, adjustment of the mA and/or kV according to patient size and/or use of iterative reconstruction technique.  CONTRAST:  75mL OMNIPAQUE  IOHEXOL  300 MG/ML  SOLN COMPARISON:  CT chest 11/11/2023 and older. Abdomen pelvis CT 01/18/2024. Chest x-ray 01/22/2024. FINDINGS: Cardiovascular: Heart is nonenlarged. Small pericardial effusion is stable. The thoracic aorta has a normal course and caliber with some partially calcified atherosclerotic plaque diffusely. Coronary artery calcifications are noted. There is plaque along the great vessels. Of note there is a variant with a early origin of the left vertebral artery. Mediastinum/Nodes: Preserved thyroid  gland. Patulous esophagus with some mild areas of wall thickening, unchanged from previous. No specific abnormal lymph node enlargement identified in the axillary regions, hilum or mediastinum. Small right hilar node again identified measuring 7 mm in short axis on series 2, image 67. Small subcarinal node as well is stable. Posterior mediastinal nodes are also seen at that are small but prominent. Lungs/Pleura: Bowel apical pleural thickening. Some paraseptal lung  changes identified. No consolidation, pneumothorax or effusion. Once again multiple nodular areas identified. In the left upper lobe there is a ground-glass area laterally measuring 5 mm which is stable today on series 8, image 84. The area just posteromedial to this measuring 3 mm also stable but slightly less confluent. The area medially in the left upper lobe more superiorly measuring 4 mm on image 66 of series 8 is stable. Additional small area anterior to this on image 68 is stable as well. No new dominant left-sided lung nodule. The right lung also had numerous areas of ill-defined nodularity. The more confluent areas in the posterior right upper lobe are more faint today with some residual ground-glass. Example posteromedial right upper lobe which measured 9 mm on the prior examination, today has some minimal ill-defined ground-glass measuring 7 mm on image 55. Other lesions more lateral to this are also  improved. There are some lesions are stable including the ground-glass lesion laterally in the posterior right upper lobe measuring 6 mm on series 8, image 50. Area more caudal right upper lobe which measured 6 mm previously, today is in ground-glass measuring 7 mm on image 71. Upper Abdomen: Adrenal glands are preserved in the upper abdomen. Musculoskeletal: Slight curvature of the spine with some degenerative changes. IMPRESSION: Multiple ill-defined opacities are nodular in the right upper lobe on the previous examination show improvement with some residual areas of ground-glass. There are a few other ground-glass areas which are stable in both upper lobes. No new lung lesions identified. Recommend continued follow up in 3 months. Stable small mediastinal and right hilar nodes. Stable pericardial effusion. Aortic Atherosclerosis (ICD10-I70.0) and Emphysema (ICD10-J43.9). Electronically Signed   By: Adrianna Horde M.D.   On: 01/31/2024 17:02   DG Chest 2 View Result Date: 01/22/2024 CLINICAL DATA:  Cough, fever EXAM: CHEST - 2 VIEW COMPARISON:  11/20/2023 FINDINGS: Heart and mediastinal contours are within normal limits. No focal opacities or effusions. No acute bony abnormality. IMPRESSION: No active cardiopulmonary disease. Electronically Signed   By: Janeece Mechanic M.D.   On: 01/22/2024 19:11   CT ABDOMEN PELVIS W CONTRAST Result Date: 01/18/2024 CLINICAL DATA:  Right lower quadrant pain EXAM: CT ABDOMEN AND PELVIS WITH CONTRAST TECHNIQUE: Multidetector CT imaging of the abdomen and pelvis was performed using the standard protocol following bolus administration of intravenous contrast. RADIATION DOSE REDUCTION: This exam was performed according to the departmental dose-optimization program which includes automated exposure control, adjustment of the mA and/or kV according to patient size and/or use of iterative reconstruction technique. CONTRAST:  OMNIPAQUE  IOHEXOL  300 MG/ML  SOLN COMPARISON:   11/18/2023 FINDINGS: Lower chest: No acute abnormality. Hepatobiliary: No focal liver abnormality is seen. No gallstones, gallbladder wall thickening, or biliary dilatation. Stable small hepatic cyst is noted in the right lobe. Pancreas: Unremarkable. No pancreatic ductal dilatation or surrounding inflammatory changes. Spleen: Normal in size without focal abnormality. Adrenals/Urinary Tract: Adrenal glands are within normal limits. Kidneys demonstrate a normal enhancement pattern. Nonobstructing upper pole left renal calculus is noted and stable measuring up to 9 mm. No obstructive changes are seen. The bladder is partially distended. Stomach/Bowel: Appendix is within normal limits. No obstructive or inflammatory changes of colon are noted. Small bowel and stomach are within normal limits. Previously seen small bowel dilatation has resolved. Vascular/Lymphatic: Aortic atherosclerosis. No enlarged abdominal or pelvic lymph nodes. Reproductive: Prostate is unremarkable. Other: No abdominal wall hernia or abnormality. No abdominopelvic ascites. Musculoskeletal: No acute or significant osseous findings. IMPRESSION: Nonobstructing  left renal stone is again seen and stable. Resolution of previously seen small bowel obstruction. No findings to correspond with the patient's given clinical history are seen. Electronically Signed   By: Violeta Grey M.D.   On: 01/18/2024 22:08     ASSESSMENT AND PLAN: This is a very pleasant 68 years old white male recently diagnosed with stage IV (T1b, N3, M1 C) non-small cell lung cancer favoring adenocarcinoma presented with right upper lobe lung nodule in addition to right hilar, subcarinal and bilateral mediastinal as well as supraclavicular lymphadenopathy.  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  His PD-L1 expression is 80% and his molecular studies showed KRAS G12C mutation. The patient underwent SRS to metastatic brain lesion under the care of Dr.  Lorri Rota and he is currently undergoing systemic chemotherapy with carboplatin  for AUC of 5, Alimta  500 Mg/M2 and Keytruda  200 Mg IV every 3 weeks status post 46 cycles.  Starting from cycle #5 the patient will be treated with maintenance treatment with Alimta  and Keytruda  every 3 weeks.  The patient continues to tolerate this treatment fairly well. He had repeat CT scan of the chest performed recently.  I personally and independently reviewed the scan and discussed the result with the patient and his wife today.  There is no evidence for disease progression.    Lung adenocarcinoma, active treatment Lung adenocarcinoma with 80% expression and KRAS G12C mutation, diagnosed in 2022. Currently on maintenance treatment with Alimta  and Keytruda  every three weeks. Completed 46 cycles, with plans to start cycle 47. Recent CT scan of the chest shows no significant changes, indicating well-managed disease. Treatment is palliative, aimed at extending life and improving quality of life. - Continue maintenance treatment with Alimta  and Keytruda  every three weeks - Proceed with cycle 47 of treatment  Bowel obstruction, resolved Recent bowel obstruction requiring hospitalization, now resolved. CT of the abdomen showed no abnormalities. No current symptoms of constipation or abdominal pain. - Follow up with GI specialist  Goals of Care Discussed that the treatment is palliative and not curative, aimed at extending life and improving quality of life. He understands the nature of the treatment and has expressed that if there is no quality of life, he would not want resuscitation in the event of an irreversible condition. - Document code status as DNR in the event of irreversible condition - Discuss and document advanced directives   The patient was advised to call immediately if he has any concerning symptoms in the interval. The patient voices understanding of current disease status and treatment options and is in  agreement with the current care plan.  All questions were answered. The patient knows to call the clinic with any problems, questions or concerns. We can certainly see the patient much sooner if necessary. The total time spent in the appointment was 30 minutes.  Disclaimer: This note was dictated with voice recognition software. Similar sounding words can inadvertently be transcribed and may not be corrected upon review.

## 2024-02-03 NOTE — Progress Notes (Unsigned)
 GI Office Note    Referring Provider: [name], FNP Primary Care Physician:  [name], FNP Primary Gastroenterologist: Windsor Hatcher.Rourk, MD  Date:  02/04/2024  ID:  Bradley Goodman, DOB 1956-07-06, MRN [id number]   Chief Complaint   Chief Complaint  Patient presents with   Follow-up    Hospital follow up. Thinks he has a bowel obstruction    History of Present Illness  Bradley Goodman is a 68 y.o. male with a history of type 2 diabetes, HTN, HLD, lung cancer with metastasis to the brain, GERD, HTN, cervical spondylosis, migraines presenting today for hospital follow up with recent ileus vs SBO.   Colonoscopy April 2013: - 5 mm polyp, 5 cm from anal verge. - Minimal internal hemorrhoids and anal papillae - Large nearly 2 cm polyp in the sigmoid colon with long stalk, clipping performed post removal. - Distal TI appeared normal. - 8 mm and 6 mm pedunculated polyp in the descending segment -Path: Tubular adenomas  Colonoscopy January 2017: -Minimal internal hemorrhoids and anal papilla - Normal-appearing rectal mucosa - Tattooed site of prior left colon polypectomy readily identified - 5 mm polyp in the mid sigmoid segment - Two 5 mm polyps at the splenic flexure - Pathology revealed tubular adenomas.  Colonoscopy January 2021: - Two 4 to 6 mm polyps in the descending colon and in the ascending colon - The examination was otherwise normal on direct and retroflexion views. - Path: Sessile serrated polyp without cytologic dysplasia, tubular adenoma without high-grade dysplasia - Recommended follow-up in 5 years  During hospitalization in February, he had placement of NG tube with some high outputs however he underwent small bowel protocol imaging with evidence of contrast throughout the colon therefore exploratory laparotomy was canceled.  During this hospitalization he did report some diarrhea therefore could be possible infectious enteritis at that time causing small  bowel obstruction.  Chest CT in February revealed multifocal pulmonary nodules predominantly within the right upper lobe findings concerning for worsening pulmonary metastatic disease.  Recently hospitalized at [facility] 4/16-4/18 where he presented with a week of abdominal bloating and generalized weakness.  Noted history of small bowel obstruction.  CT of the abdomen pelvis 4/12 showed nonobstructing left renal stone and resolution of previously seen small bowel obstruction.  He was admitted for failure to thrive .  Head CT in February with evidence of ileus versus enteritis given mildly dilated loops of small bowel in the anterior abdomen.     Latest Ref Rng & Units 02/03/2024   12:54 PM 01/24/2024    5:03 AM 01/23/2024    3:41 AM  CBC  WBC 4.0 - 10.5 K/uL 9.6  5.4  6.2   Hemoglobin 13.0 - 17.0 g/dL 40.9  9.2  8.6   Hematocrit 39.0 - 52.0 % 37.9  31.1  29.5   Platelets 150 - 400 K/uL 616  232  218        Latest Ref Rng & Units 02/03/2024   12:54 PM 01/24/2024    5:03 AM 01/23/2024    3:41 AM  CMP  Glucose 70 - 99 mg/dL 811  914  80   BUN 8 - 23 mg/dL 12  7  9    Creatinine 0.61 - 1.24 mg/dL 7.82  9.56  2.13   Sodium 135 - 145 mmol/L 142  136  136   Potassium 3.5 - 5.1 mmol/L 4.2  3.7  3.6   Chloride 98 - 111 mmol/L 108  104  102   CO2 22 - 32 mmol/L 29  24  23    Calcium  8.9 - 10.3 mg/dL 9.0  8.5  8.1   Total Protein 6.5 - 8.1 g/dL 7.1  6.1  5.7   Total Bilirubin 0.0 - 1.2 mg/dL 0.2  0.5  0.7   Alkaline Phos 38 - 126 U/L 66  65  62   AST 15 - 41 U/L 19  19  18    ALT 0 - 44 U/L 16  15  13      Today:  About every other day or less frequently he has intermittent LLQ pain. Passing normal amounts of gas. Feels more like gas pains. Long time ago he has had bowel obstructions. States he said Dr. Larrie Po thought there could have been a mild volvulus. Thinks his first episode was worse years ago but it was worse prior to that. States he was given a pill after that.Not currently taking anything  for gas. No issues with weight or appetite at this time. No N/V. No acid reflux (pantoprazole  controls symptoms) or dysphagia.   Denies issues with constipation or diarrhea - no issues since being off ozempic  (has been off since February). Prior to February he was having off and on constipation - would go 3-4 days without BM. Currently he is strain occasionally but stools are soft. No blood within the stool. No recent fevers sicne his hospitalization.   Currently doing one chemo along with immunotherapy currently.  Does protein drinks (fairlife) and some other shakes. Has taken iron before (took it 3 days then he went to the hospital).   Will take colace daily.   Wt Readings from Last 3 Encounters:  02/04/24 180 lb 9.6 oz (81.9 kg)  02/03/24 178 lb 11.2 oz (81.1 kg)  01/22/24 179 lb 6.4 oz (81.4 kg)    Current Outpatient Medications  Medication Sig Dispense Refill   acetaminophen  (TYLENOL ) 500 MG tablet Take 1,000 mg by mouth every 6 (six) hours as needed for mild pain (pain score 1-3) or fever.     chlorproMAZINE  (THORAZINE ) 10 MG tablet Take 1 tablet (10 mg total) by mouth 3 (three) times daily as needed for hiccoughs. 90 tablet 0   cholecalciferol  (VITAMIN D ) 1000 UNITS tablet Take 1,000 Units by mouth every evening.     Continuous Glucose Sensor (FREESTYLE LIBRE 3 SENSOR) MISC Place 1 sensor on the skin every 14 days. Use to check glucose continuously 6 each 3   dexamethasone  (DECADRON ) 1 MG tablet Take 1 tablet (1 mg total) by mouth daily with breakfast. 90 tablet 1   docusate sodium  (COLACE) 100 MG capsule Take 100 mg by mouth daily.     folic acid  (FOLVITE ) 1 MG tablet Take 1 tablet (1 mg total) by mouth daily. 90 tablet 0   Krill Oil 300 MG CAPS Take 300 mg by mouth every evening.     levothyroxine  (SYNTHROID ) 88 MCG tablet Take 1 tablet (88 mcg total) by mouth daily before breakfast. 90 tablet 1   loratadine  (CLARITIN ) 10 MG tablet Take 10 mg by mouth every evening.      losartan   (COZAAR ) 50 MG tablet Take 1.5 tablets (75 mg total) by mouth daily. 135 tablet 0   mirtazapine  (REMERON ) 15 MG tablet Take 1 tablet (15 mg total) by mouth at bedtime. 90 tablet 1   Multiple Vitamins-Minerals (MULTIVITAMIN GUMMIES ADULT PO) Take 2 each by mouth daily.     ondansetron  (ZOFRAN ) 4 MG tablet Take 1 tablet (4  mg total) by mouth every 6 (six) hours as needed for nausea. 20 tablet 0   pantoprazole  (PROTONIX ) 40 MG tablet Take 1 tablet (40 mg total) by mouth every evening. 90 tablet 1   PRESCRIPTION MEDICATION Testosterone  injection     rosuvastatin  (CRESTOR ) 20 MG tablet Take 1 tablet (20 mg total) by mouth daily. 90 tablet 1   SYRINGE-NEEDLE, DISP, 3 ML (B-D 3CC LUER-LOK SYR 21GX1-1/2) 21G X 1-1/2" 3 ML MISC Use to inject testosterone  every week 100 each 2   testosterone  cypionate (DEPOTESTOTERONE CYPIONATE) 100 MG/ML injection Take 50mg  ( 0.41ml) and 100mg  ( 1ml) into muscle alternatively weekly. 10 mL 1   venlafaxine  XR (EFFEXOR -XR) 75 MG 24 hr capsule Take 2 capsules (150 mg total) by mouth daily with breakfast. 180 capsule 2   No current facility-administered medications for this visit.    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   PONV (postoperative nausea and vomiting)    Type 2 diabetes mellitus (HCC)     Past Surgical History:  Procedure Laterality Date   Bilateral inguinal hernia repair     BRONCHIAL NEEDLE ASPIRATION BIOPSY  04/05/2021   Procedure: BRONCHIAL NEEDLE ASPIRATION BIOPSIES;  Surgeon: Prudy Brownie, DO;  Location: [facility];  Service: Pulmonary;;   COLONOSCOPY  01/23/2012   Procedure: COLONOSCOPY;  Surgeon: Suzette Espy, MD;  Location: [facility];  Service: Endoscopy;  Laterality: N/A;  9:30 AM   COLONOSCOPY N/A 10/11/2015   Procedure:  COLONOSCOPY;  Surgeon: Suzette Espy, MD;  Location: [facility];  Service: Endoscopy;  Laterality: N/A;  830   COLONOSCOPY N/A 10/14/2019   Procedure: COLONOSCOPY;  Surgeon: Suzette Espy, MD;  Location: [facility];  Service: Endoscopy;  Laterality: N/A;  1:45   POLYPECTOMY  10/14/2019   Procedure: POLYPECTOMY;  Surgeon: Suzette Espy, MD;  Location: [facility];  Service: Endoscopy;;  ascending colon, descending colon   VIDEO BRONCHOSCOPY WITH ENDOBRONCHIAL ULTRASOUND N/A 04/05/2021   Procedure: VIDEO BRONCHOSCOPY WITH ENDOBRONCHIAL ULTRASOUND;  Surgeon: Prudy Brownie, DO;  Location: [facility];  Service: Pulmonary;  Laterality: N/A;    Family History  Problem Relation Age of Onset   Hypertension Mother    Diabetes Mother    Heart attack Mother    Hypertension Father    Heart attack Father    Heart attack Brother    Colon cancer Neg Hx     Allergies as of 02/04/2024   (No Known Allergies)    Social History   Socioeconomic History   Marital status: Married    Spouse name: Not on file   Number of children: Not on file   Years of education: Not on file   Highest education level: Bachelor's degree (e.g., BA, AB, BS)  Occupational History   Not on file  Tobacco Use   Smoking status: Every Day    Current packs/day: 0.50    Average packs/day: 0.5 packs/day for 50.3 years (25.1 ttl pk-yrs)    Types: Cigarettes    Start date: 10/30/1973   Smokeless tobacco: Never  Vaping Use   Vaping status: Never Used  Substance and Sexual Activity   Alcohol use: Not Currently    Comment: One drink  every 6 months.   Drug use: No   Sexual activity: Yes  Other Topics Concern   Not on file  Social History Narrative   Not on file   Social Drivers of Health   Financial Resource Strain: Patient Declined (10/14/2023)   Overall Financial Resource Strain (CARDIA)    Difficulty of Paying Living Expenses: Patient declined  Food Insecurity: No Food Insecurity (01/22/2024)   Hunger  Vital Sign    Worried About Running Out of Food in the Last Year: Never true    Ran Out of Food in the Last Year: Never true  Transportation Needs: No Transportation Needs (01/22/2024)   PRAPARE - Administrator, Civil Service (Medical): No    Lack of Transportation (Non-Medical): No  Physical Activity: Unknown (10/14/2023)   Exercise Vital Sign    Days of Exercise per Week: 1 day    Minutes of Exercise per Session: Patient declined  Stress: No Stress Concern Present (10/14/2023)   [employer] of Occupational Health - Occupational Stress Questionnaire    Feeling of Stress : Not at all  Social Connections: Moderately Isolated (01/22/2024)   Social Connection and Isolation Panel [NHANES]    Frequency of Communication with Friends and Family: More than three times a week    Frequency of Social Gatherings with Friends and Family: Once a week    Attends Religious Services: Never    Database administrator or Organizations: No    Attends Engineer, structural: Never    Marital Status: Married   Review of Systems   Gen: Denies fever, chills, anorexia. Denies fatigue, weakness, weight loss.  CV: Denies chest pain, palpitations, syncope, peripheral edema, and claudication. Resp: Denies dyspnea at rest, cough, wheezing, coughing up blood, and pleurisy. GI: See HPI Derm: Denies rash, itching, dry skin Psych: Denies depression, anxiety, memory loss, confusion. No homicidal or suicidal ideation.  Heme: Denies bruising, bleeding, and enlarged lymph nodes.  Physical Exam   BP 139/80 (BP Location: Right Arm, Patient Position: Sitting, Cuff Size: Normal)   Pulse (!) 55   Temp 97.6 F (36.4 C) (Temporal)   Ht 5\' 11"  (1.803 m)   Wt 180 lb 9.6 oz (81.9 kg)   BMI 25.19 kg/m   General:   Alert and oriented. No distress noted. Pleasant and cooperative.  Head:  Normocephalic and atraumatic. Eyes:  Conjuctiva clear without scleral icterus. Mouth:  Oral mucosa pink and moist.  Good dentition. No lesions. Abdomen:  +BS, soft,non-distended. Mild ttp to LLQ. No rebound or guarding. No HSM or masses noted. Rectal: deferred Msk:  Symmetrical without gross deformities. Normal posture. Extremities:  Without edema. Neurologic:  Alert and  oriented x4 Psych:  Alert and cooperative. Normal mood and affect.  Assessment  Bradley Goodman is a 68 y.o. male with a history of type 2 diabetes, HTN, HLD, lung cancer with metastasis to the brain, GERD, HTN, cervical spondylosis, migraines presenting today for hospital follow up with recent ileus vs SBO.   Recent small bowel obstruction, bloating, gassiness, constipation: - Could be secondary to chemotherapy, currently undergoing treatment with Alimta  and Keytruda  - Had denied any constipation, no reported abdominal pain as of yesterday when he followed up with oncology - SBO could be drug-induced and/or secondary to dehydration - Possible volvulus proposed by general surgery -Has mild constipation with occasional need to strain.  Stools hard at times but mostly soft.  Does take stool softener daily - Need to ensure regularity of bowel  movements given recurrent small bowel obstructions.  Discussed maintenance with MiraLAX.  Provided instructions on titration if needed. - Need to increase fiber intake, discussed in the office today.  GERD controlled with pantoprazole  40 mg daily.  PLAN   Miralax 17g daily, reduce if diarrhea Continue colace High fiber diet Continue pantoprazole  40 mg once daily. Follow up in 4 months   Julian Obey, MSN, FNP-BC, AGACNP-BC [facility]

## 2024-02-04 ENCOUNTER — Encounter: Payer: Self-pay | Admitting: Gastroenterology

## 2024-02-04 ENCOUNTER — Ambulatory Visit (INDEPENDENT_AMBULATORY_CARE_PROVIDER_SITE_OTHER): Admitting: Gastroenterology

## 2024-02-04 VITALS — BP 139/80 | HR 55 | Temp 97.6°F | Ht 71.0 in | Wt 180.6 lb

## 2024-02-04 DIAGNOSIS — Z8719 Personal history of other diseases of the digestive system: Secondary | ICD-10-CM

## 2024-02-04 DIAGNOSIS — R140 Abdominal distension (gaseous): Secondary | ICD-10-CM

## 2024-02-04 DIAGNOSIS — K219 Gastro-esophageal reflux disease without esophagitis: Secondary | ICD-10-CM

## 2024-02-04 DIAGNOSIS — K5900 Constipation, unspecified: Secondary | ICD-10-CM | POA: Diagnosis not present

## 2024-02-04 NOTE — Patient Instructions (Signed)
 Please trial Miralax 17g (1 capful daily) and 6 as 8 ounces of fluid of your choice.  You may continue to take your Colace daily.  If you began having diarrhea with the MiraLAX and Colace combo, stop the Colace and see if this helps.  You could also titrate your MiraLAX to 17 g every other day or a half a capful daily.  I want you to titrate for at least a soft bowel movement where you are not needing to strain every other day.  Continue to work on increasing fiber in your diet, if you would like to start a supplementation I think that would be great.  You can trial Benefiber or Metamucil if you would like.  Please notify me if you have any return of abdominal pain, or inability to have a bowel movement along with your abdominal pain.  We will plan to follow-up in about 4 months, sooner if needed.  It was a pleasure to see you today. I want to create trusting relationships with patients. If you receive a survey regarding your visit,  I greatly appreciate you taking time to fill this out on paper or through your MyChart. I value your feedback.  Julian Obey, MSN, FNP-BC, AGACNP-BC [facility]

## 2024-02-07 ENCOUNTER — Other Ambulatory Visit: Payer: Self-pay

## 2024-02-11 ENCOUNTER — Other Ambulatory Visit: Payer: Self-pay

## 2024-02-11 ENCOUNTER — Other Ambulatory Visit (HOSPITAL_COMMUNITY): Payer: Self-pay

## 2024-02-13 ENCOUNTER — Ambulatory Visit
Admission: RE | Admit: 2024-02-13 | Discharge: 2024-02-13 | Disposition: A | Discharge status: Home / Self Care | Payer: Commercial Managed Care - PPO | Source: Ambulatory Visit | Attending: Internal Medicine | Admitting: Internal Medicine

## 2024-02-13 DIAGNOSIS — C7931 Secondary malignant neoplasm of brain: Secondary | ICD-10-CM | POA: Diagnosis not present

## 2024-02-13 DIAGNOSIS — C801 Malignant (primary) neoplasm, unspecified: Secondary | ICD-10-CM | POA: Diagnosis not present

## 2024-02-13 MED ORDER — GADOPICLENOL 0.5 MMOL/ML IV SOLN
8.0000 mL | Freq: Once | INTRAVENOUS | Status: AC | PRN
  Administered 2024-02-13: 8 mL via INTRAVENOUS

## 2024-02-17 ENCOUNTER — Encounter

## 2024-02-18 ENCOUNTER — Other Ambulatory Visit: Payer: Self-pay | Admitting: "Endocrinology

## 2024-02-18 ENCOUNTER — Telehealth: Payer: Self-pay | Admitting: Internal Medicine

## 2024-02-18 ENCOUNTER — Inpatient Hospital Stay: Payer: Commercial Managed Care - PPO | Attending: Physician Assistant | Admitting: Internal Medicine

## 2024-02-18 ENCOUNTER — Other Ambulatory Visit (HOSPITAL_COMMUNITY): Payer: Self-pay

## 2024-02-18 VITALS — BP 127/84 | HR 97 | Temp 98.7°F | Resp 18 | Ht 71.0 in | Wt 176.6 lb

## 2024-02-18 DIAGNOSIS — C7951 Secondary malignant neoplasm of bone: Secondary | ICD-10-CM | POA: Insufficient documentation

## 2024-02-18 DIAGNOSIS — Z79899 Other long term (current) drug therapy: Secondary | ICD-10-CM | POA: Insufficient documentation

## 2024-02-18 DIAGNOSIS — Z5111 Encounter for antineoplastic chemotherapy: Secondary | ICD-10-CM | POA: Diagnosis not present

## 2024-02-18 DIAGNOSIS — Z5112 Encounter for antineoplastic immunotherapy: Secondary | ICD-10-CM | POA: Insufficient documentation

## 2024-02-18 DIAGNOSIS — C7931 Secondary malignant neoplasm of brain: Secondary | ICD-10-CM | POA: Diagnosis not present

## 2024-02-18 DIAGNOSIS — C3411 Malignant neoplasm of upper lobe, right bronchus or lung: Secondary | ICD-10-CM | POA: Diagnosis not present

## 2024-02-18 DIAGNOSIS — Z7952 Long term (current) use of systemic steroids: Secondary | ICD-10-CM | POA: Insufficient documentation

## 2024-02-18 DIAGNOSIS — E538 Deficiency of other specified B group vitamins: Secondary | ICD-10-CM | POA: Insufficient documentation

## 2024-02-18 NOTE — Telephone Encounter (Signed)
 Patient scheduled appointments. Patient is aware of all appointment details.

## 2024-02-18 NOTE — Progress Notes (Signed)
 [facility] at [facility] [address]  [place], [place] [address] [phone]   Interval Evaluation  Date of Service: 02/18/24 Patient Name: Bradley Goodman Patient MRN: [id number] Patient DOB: 25-Jan-1956 Provider: Mamie Searles, MD  Identifying Statement:  Bradley Goodman is a 68 y.o. male with Malignant neoplasm metastatic to brain [facility])   Primary Cancer:  Oncologic History: Oncology History  Adenocarcinoma of right lung, stage 4 (HCC)  04/25/2021 Initial Diagnosis   Adenocarcinoma of right lung, stage 4 (HCC)   04/25/2021 Cancer Staging   Staging form: Lung, AJCC 8th Edition - Clinical: Stage IVB (cT1b, cN3, cM1c) - Signed by Marlene Simas, MD on 04/25/2021   05/08/2021 -  Chemotherapy   Patient is on Treatment Plan : LUNG Carboplatin  (5) + Pemetrexed  (500) + Pembrolizumab  (200) D1 q21d Induction x 4 cycles / Maintenance Pemetrexed  (500) + Pembrolizumab  (200) D1 q21d      CNS Oncologic History 05/04/21: Radiosurgery to 4 brain metastases, 5 fractions to pituitary lesion  Interval History: Bradley Goodman presents today for follow up after recent MRI brain.  Denies and new or worsening neurologic symptoms today.  No issues with visual impairment as prior.  Remains active with his garden, golfing.  Denies seizures and headaches.  Continues on decadron  1mg  daily through endocrinology.  H+P (05/23/21) Patient presented to medical attention initially on 03/27/21 with new onset left eyelid drooping.  CNS imaging was obtained, which demonstrated multiple enhancing lesions consistent with brain metastases.  Systemic workup demonstrated lung cancer, which was confirmed through endobronchial biopsy.  He underwent single fraction radiosurgery to brain lesions, and fractionated radiosurgery to pituitary lesion.  Following radiation, eyelid drooping improved, but he developed "tunnel vision" visual deficits, described as "like looking through plastic".  This has  been more or less static over the past week or so, though it did improve transiently after dosing decadron  with chemotherapy on 05/08/21.   Medications: Current Outpatient Medications on File Prior to Visit  Medication Sig Dispense Refill   acetaminophen  (TYLENOL ) 500 MG tablet Take 1,000 mg by mouth every 6 (six) hours as needed for mild pain (pain score 1-3) or fever.     chlorproMAZINE  (THORAZINE ) 10 MG tablet Take 1 tablet (10 mg total) by mouth 3 (three) times daily as needed for hiccoughs. 90 tablet 0   cholecalciferol  (VITAMIN D ) 1000 UNITS tablet Take 1,000 Units by mouth every evening.     Continuous Glucose Sensor (FREESTYLE LIBRE 3 SENSOR) MISC Place 1 sensor on the skin every 14 days. Use to check glucose continuously 6 each 3   dexamethasone  (DECADRON ) 1 MG tablet Take 1 tablet (1 mg total) by mouth daily with breakfast. 90 tablet 1   folic acid  (FOLVITE ) 1 MG tablet Take 1 tablet (1 mg total) by mouth daily. 90 tablet 0   Krill Oil 300 MG CAPS Take 300 mg by mouth every evening.     levothyroxine  (SYNTHROID ) 88 MCG tablet Take 1 tablet (88 mcg total) by mouth daily before breakfast. 90 tablet 1   loratadine  (CLARITIN ) 10 MG tablet Take 10 mg by mouth every evening.      losartan  (COZAAR ) 50 MG tablet Take 1.5 tablets (75 mg total) by mouth daily. 135 tablet 0   mirtazapine  (REMERON ) 15 MG tablet Take 1 tablet (15 mg total) by mouth at bedtime. 90 tablet 1   Multiple Vitamins-Minerals (MULTIVITAMIN GUMMIES ADULT PO) Take 2 each by mouth daily.     ondansetron  (  ZOFRAN ) 4 MG tablet Take 1 tablet (4 mg total) by mouth every 6 (six) hours as needed for nausea. 20 tablet 0   pantoprazole  (PROTONIX ) 40 MG tablet Take 1 tablet (40 mg total) by mouth every evening. 90 tablet 1   polyethylene glycol powder (GLYCOLAX/MIRALAX) 17 GM/SCOOP powder Take 119 g by mouth daily as needed.     PRESCRIPTION MEDICATION Testosterone  injection     rosuvastatin  (CRESTOR ) 20 MG tablet Take 1 tablet (20 mg  total) by mouth daily. 90 tablet 1   SYRINGE-NEEDLE, DISP, 3 ML (B-D 3CC LUER-LOK SYR 21GX1-1/2) 21G X 1-1/2" 3 ML MISC Use to inject testosterone  every week 100 each 2   testosterone  cypionate (DEPOTESTOTERONE CYPIONATE) 100 MG/ML injection Take 50mg  ( 0.57ml) and 100mg  ( 1ml) into muscle alternatively weekly. 10 mL 1   venlafaxine  XR (EFFEXOR -XR) 75 MG 24 hr capsule Take 2 capsules (150 mg total) by mouth daily with breakfast. 180 capsule 2   docusate sodium  (COLACE) 100 MG capsule Take 100 mg by mouth daily.     No current facility-administered medications on file prior to visit.    Allergies: No Known Allergies Past Medical History:  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   PONV (postoperative nausea and vomiting)    Type 2 diabetes mellitus (HCC)    Past Surgical History:  Past Surgical History:  Procedure Laterality Date   Bilateral inguinal hernia repair     BRONCHIAL NEEDLE ASPIRATION BIOPSY  04/05/2021   Procedure: BRONCHIAL NEEDLE ASPIRATION BIOPSIES;  Surgeon: Prudy Brownie, DO;  Location: [facility];  Service: Pulmonary;;   COLONOSCOPY  01/23/2012   Procedure: COLONOSCOPY;  Surgeon: Suzette Espy, MD;  Location: [facility];  Service: Endoscopy;  Laterality: N/A;  9:30 AM   COLONOSCOPY N/A 10/11/2015   Procedure: COLONOSCOPY;  Surgeon: Suzette Espy, MD;  Location: [facility];  Service: Endoscopy;  Laterality: N/A;  830   COLONOSCOPY N/A 10/14/2019   Procedure: COLONOSCOPY;  Surgeon: Suzette Espy, MD;  Location: [facility];  Service: Endoscopy;  Laterality: N/A;  1:45   POLYPECTOMY  10/14/2019   Procedure: POLYPECTOMY;  Surgeon: Suzette Espy, MD;  Location: [facility];  Service: Endoscopy;;  ascending colon, descending colon   VIDEO  BRONCHOSCOPY WITH ENDOBRONCHIAL ULTRASOUND N/A 04/05/2021   Procedure: VIDEO BRONCHOSCOPY WITH ENDOBRONCHIAL ULTRASOUND;  Surgeon: Prudy Brownie, DO;  Location: [facility] END[facility];  Service: Pulmonary;  Laterality: N/A;   Social History:  Social History   Socioeconomic History   Marital status: Married    Spouse name: Not on file   Number of children: Not on file   Years of education: Not on file   Highest education level: Bachelor's degree (e.g., BA, AB, BS)  Occupational History   Not on file  Tobacco Use   Smoking status: Every Day    Current packs/day: 0.50    Average packs/day: 0.5 packs/day for 50.3 years (25.2 ttl pk-yrs)    Types: Cigarettes    Start date: 10/30/1973   Smokeless tobacco: Never  Vaping Use   Vaping status: Never Used  Substance and Sexual Activity   Alcohol use: Not Currently    Comment: One drink every 6 months.  Drug use: No   Sexual activity: Yes  Other Topics Concern   Not on file  Social History Narrative   Not on file   Social Drivers of Health   Financial Resource Strain: Patient Declined (10/14/2023)   Overall Financial Resource Strain (CARDIA)    Difficulty of Paying Living Expenses: Patient declined  Food Insecurity: No Food Insecurity (01/22/2024)   Hunger Vital Sign    Worried About Running Out of Food in the Last Year: Never true    Ran Out of Food in the Last Year: Never true  Transportation Needs: No Transportation Needs (01/22/2024)   PRAPARE - Administrator, Civil Service (Medical): No    Lack of Transportation (Non-Medical): No  Physical Activity: Unknown (10/14/2023)   Exercise Vital Sign    Days of Exercise per Week: 1 day    Minutes of Exercise per Session: Patient declined  Stress: No Stress Concern Present (10/14/2023)   [employer] of Occupational Health - Occupational Stress Questionnaire    Feeling of Stress : Not at all  Social Connections: Moderately Isolated (01/22/2024)   Social Connection and Isolation  Panel [NHANES]    Frequency of Communication with Friends and Family: More than three times a week    Frequency of Social Gatherings with Friends and Family: Once a week    Attends Religious Services: Never    Database administrator or Organizations: No    Attends Banker Meetings: Never    Marital Status: Married  Catering manager Violence: Not At Risk (01/22/2024)   Humiliation, Afraid, Rape, and Kick questionnaire    Fear of Current or Ex-Partner: No    Emotionally Abused: No    Physically Abused: No    Sexually Abused: No   Family History:  Family History  Problem Relation Age of Onset   Hypertension Mother    Diabetes Mother    Heart attack Mother    Hypertension Father    Heart attack Father    Heart attack Brother    Colon cancer Neg Hx     Review of Systems: Constitutional: Doesn't report fevers, chills or abnormal weight loss Eyes: Doesn't report blurriness of vision Ears, nose, mouth, throat, and face: Doesn't report sore throat Respiratory: Doesn't report cough, dyspnea or wheezes Cardiovascular: Doesn't report palpitation, chest discomfort  Gastrointestinal:  Doesn't report nausea, constipation, diarrhea GU: Doesn't report incontinence Skin: Doesn't report skin rashes Neurological: Per HPI Musculoskeletal: Doesn't report joint pain Behavioral/Psych: Doesn't report anxiety  Physical Exam: Vitals:   02/18/24 1111  BP: 127/84  Pulse: 97  Resp: 18  Temp: 98.7 F (37.1 C)  SpO2: 100%     KPS: 90. General: Alert, cooperative, pleasant, in no acute distress Head: Normal EENT: No conjunctival injection or scleral icterus.  Lungs: Resp effort normal Cardiac: Regular rate Abdomen: Non-distended abdomen Skin: No rashes cyanosis or petechiae. Extremities: No clubbing or edema  Neurologic Exam: Mental Status: Awake, alert, attentive to examiner. Oriented to self and environment. Language is fluent with intact comprehension.  Cranial Nerves:  Visual acuity is grossly normal. Visual fields are full. Extra-ocular movements intact. No ptosis. Face is symmetric Motor: Tone and bulk are normal. Power is full in both arms and legs. Reflexes are symmetric, no pathologic reflexes present.  Sensory: Intact to light touch Gait: Normal.   Labs: I have reviewed the data as listed    Component Value Date/Time   NA 142 02/03/2024 1254   NA 144 11/26/2023 1135  K 4.2 02/03/2024 1254   CL 108 02/03/2024 1254   CO2 29 02/03/2024 1254   GLUCOSE 102 (H) 02/03/2024 1254   BUN 12 02/03/2024 1254   BUN 11 11/26/2023 1135   CREATININE 0.91 02/03/2024 1254   CALCIUM  9.0 02/03/2024 1254   PROT 7.1 02/03/2024 1254   PROT 6.0 11/26/2023 1135   ALBUMIN 3.9 02/03/2024 1254   ALBUMIN 3.6 (L) 11/26/2023 1135   AST 19 02/03/2024 1254   ALT 16 02/03/2024 1254   ALKPHOS 66 02/03/2024 1254   BILITOT 0.2 02/03/2024 1254   GFRNONAA >60 02/03/2024 1254   GFRAA 82 07/14/2020 0945   Lab Results  Component Value Date   WBC 9.6 02/03/2024   NEUTROABS 7.6 02/03/2024   HGB 11.3 (L) 02/03/2024   HCT 37.9 (L) 02/03/2024   MCV 85.7 02/03/2024   PLT 616 (H) 02/03/2024   Imaging:  [facility] Clinician Interpretation: I have personally reviewed the CNS images as listed.  My interpretation, in the context of the patient's clinical presentation, is treatment effect vs true progression  MR BRAIN W WO CONTRAST Result Date: 02/13/2024 CLINICAL DATA:  Provided history: Malignant neoplasm metastatic to brain. Brain/CNS neoplasm, assess treatment response. EXAM: MRI HEAD WITHOUT AND WITH CONTRAST TECHNIQUE: Multiplanar, multiecho pulse sequences of the brain and surrounding structures were obtained without and with intravenous contrast. CONTRAST:  8 mL Vueway  intravenous contrast. COMPARISON:  Prior brain MRI examinations 08/15/2023 and earlier. FINDINGS: Brain: No age-advanced or lobar predominant cerebral atrophy. Punctate focus of enhancement within the posterior left  frontal lobe, not appreciated on prior examinations and indeterminate for a new metastasis versus vascular enhancement (series 13, image 138). Continued slight interval increase in size of an enhancing lesion within the right parietal lobe, now measuring 3.5 mm (previously 3 mm) (series 13, image 84). Punctate enhancing lesion within the left cerebellar hemisphere, unchanged (series 13, image 46). Moderate for age multifocal T2 FLAIR hyperintense signal abnormality within the cerebral white matter, nonspecific but compatible with chronic small vessel ischemic disease. No cortical encephalomalacia is identified. There is no acute infarct. No chronic intracranial blood products. No extra-axial fluid collection. No midline shift. Vascular: Maintained flow voids within the proximal large arterial vessels. Skull and upper cervical spine: No focal worrisome marrow lesion. Sinuses/Orbits: No mass or acute finding within the imaged orbits. Mild mucosal thickening within the bilateral ethmoid, right sphenoid and right maxillary sinuses. Other: Small right mastoid effusion. Impression #1 will be called to the ordering clinician or representative by the Radiologist Assistant, and communication documented in the PACS or [employer]. IMPRESSION: 1. Punctate focus of enhancement within the posterior left frontal lobe, not appreciated on prior examinations and indeterminate for a new metastasis versus vascular enhancement. A short-interval follow-up brain MRI (with and without contrast) is recommended for surveillance. 2. Continued slight increase in size of an enhancing lesion within the right parietal lobe, now measuring 3.5 mm (previously 3 mm). 3. Unchanged punctate enhancing lesion within the left cerebellar hemisphere. 4. Moderate cerebral white matter chronic small vessel ischemic disease. 5. Mild paranasal sinus mucosal thickening. 6. Small right mastoid effusion. Electronically Signed   By: Bascom Lily D.O.   On:  02/13/2024 10:12   CT Chest W Contrast Result Date: 01/31/2024 CLINICAL DATA:  Staging non-small-cell lung cancer. Diagnosed in 2022. Chemotherapy and immunotherapy in progress. Completed XRT. * Tracking Code: BO * EXAM: CT CHEST WITH CONTRAST TECHNIQUE: Multidetector CT imaging of the chest was performed during intravenous contrast administration. RADIATION DOSE REDUCTION:  This exam was performed according to the departmental dose-optimization program which includes automated exposure control, adjustment of the mA and/or kV according to patient size and/or use of iterative reconstruction technique. CONTRAST:  75mL OMNIPAQUE  IOHEXOL  300 MG/ML  SOLN COMPARISON:  CT chest 11/11/2023 and older. Abdomen pelvis CT 01/18/2024. Chest x-ray 01/22/2024. FINDINGS: Cardiovascular: Heart is nonenlarged. Small pericardial effusion is stable. The thoracic aorta has a normal course and caliber with some partially calcified atherosclerotic plaque diffusely. Coronary artery calcifications are noted. There is plaque along the great vessels. Of note there is a variant with a early origin of the left vertebral artery. Mediastinum/Nodes: Preserved thyroid  gland. Patulous esophagus with some mild areas of wall thickening, unchanged from previous. No specific abnormal lymph node enlargement identified in the axillary regions, hilum or mediastinum. Small right hilar node again identified measuring 7 mm in short axis on series 2, image 67. Small subcarinal node as well is stable. Posterior mediastinal nodes are also seen at that are small but prominent. Lungs/Pleura: Bowel apical pleural thickening. Some paraseptal lung changes identified. No consolidation, pneumothorax or effusion. Once again multiple nodular areas identified. In the left upper lobe there is a ground-glass area laterally measuring 5 mm which is stable today on series 8, image 84. The area just posteromedial to this measuring 3 mm also stable but slightly less confluent.  The area medially in the left upper lobe more superiorly measuring 4 mm on image 66 of series 8 is stable. Additional small area anterior to this on image 68 is stable as well. No new dominant left-sided lung nodule. The right lung also had numerous areas of ill-defined nodularity. The more confluent areas in the posterior right upper lobe are more faint today with some residual ground-glass. Example posteromedial right upper lobe which measured 9 mm on the prior examination, today has some minimal ill-defined ground-glass measuring 7 mm on image 55. Other lesions more lateral to this are also improved. There are some lesions are stable including the ground-glass lesion laterally in the posterior right upper lobe measuring 6 mm on series 8, image 50. Area more caudal right upper lobe which measured 6 mm previously, today is in ground-glass measuring 7 mm on image 71. Upper Abdomen: Adrenal glands are preserved in the upper abdomen. Musculoskeletal: Slight curvature of the spine with some degenerative changes. IMPRESSION: Multiple ill-defined opacities are nodular in the right upper lobe on the previous examination show improvement with some residual areas of ground-glass. There are a few other ground-glass areas which are stable in both upper lobes. No new lung lesions identified. Recommend continued follow up in 3 months. Stable small mediastinal and right hilar nodes. Stable pericardial effusion. Aortic Atherosclerosis (ICD10-I70.0) and Emphysema (ICD10-J43.9). Electronically Signed   By: Adrianna Horde M.D.   On: 01/31/2024 17:02   DG Chest 2 View Result Date: 01/22/2024 CLINICAL DATA:  Cough, fever EXAM: CHEST - 2 VIEW COMPARISON:  11/20/2023 FINDINGS: Heart and mediastinal contours are within normal limits. No focal opacities or effusions. No acute bony abnormality. IMPRESSION: No active cardiopulmonary disease. Electronically Signed   By: Janeece Mechanic M.D.   On: 01/22/2024 19:11      Assessment/Plan Malignant neoplasm metastatic to brain [facility])  Bradley Goodman is clinically and radiographically stable today.  MRI brain demonstrates very subtle increase in enhancement within treated right parietal lesion.  There is an additional novel tiny focus of enhancement within left frontal lobe of unclear etiology.  Will recommend close surveillance of this lesion.  For pan-hypopit, will continue to follow with Dr. Monte Antonio.    He will con't to undergo chemotherapy with Dr. Marguerita Shih.  We appreciate the opportunity to participate in the care of Bradley Goodman.   We ask that Bradley Goodman return to clinic in 3 months following next brain MRI, or sooner as needed.  All questions were answered. The patient knows to call the clinic with any problems, questions or concerns. No barriers to learning were detected.  The total time spent in the encounter was 30 minutes and more than 50% was on counseling and review of test results   Mamie Searles, MD Medical Director of Neuro-Oncology [facility] [place] 02/18/24 11:37 AM

## 2024-02-19 ENCOUNTER — Other Ambulatory Visit: Payer: Self-pay | Admitting: Radiation Therapy

## 2024-02-20 ENCOUNTER — Other Ambulatory Visit (HOSPITAL_COMMUNITY): Payer: Self-pay

## 2024-02-20 MED ORDER — TESTOSTERONE CYPIONATE 100 MG/ML IM SOLN
INTRAMUSCULAR | 1 refills | Status: DC
  Filled 2024-02-20: qty 10, 84d supply, fill #0

## 2024-02-21 ENCOUNTER — Other Ambulatory Visit (HOSPITAL_COMMUNITY): Payer: Self-pay

## 2024-02-21 ENCOUNTER — Encounter: Payer: Self-pay | Admitting: Internal Medicine

## 2024-02-21 ENCOUNTER — Other Ambulatory Visit: Payer: Self-pay

## 2024-02-24 ENCOUNTER — Inpatient Hospital Stay: Payer: Commercial Managed Care - PPO | Admitting: Internal Medicine

## 2024-02-24 ENCOUNTER — Inpatient Hospital Stay: Payer: Commercial Managed Care - PPO

## 2024-02-24 VITALS — BP 141/94 | HR 88 | Temp 97.6°F | Resp 18 | Wt 179.0 lb

## 2024-02-24 DIAGNOSIS — C3491 Malignant neoplasm of unspecified part of right bronchus or lung: Secondary | ICD-10-CM

## 2024-02-24 DIAGNOSIS — C7931 Secondary malignant neoplasm of brain: Secondary | ICD-10-CM | POA: Diagnosis not present

## 2024-02-24 DIAGNOSIS — C3411 Malignant neoplasm of upper lobe, right bronchus or lung: Secondary | ICD-10-CM | POA: Diagnosis not present

## 2024-02-24 DIAGNOSIS — C7951 Secondary malignant neoplasm of bone: Secondary | ICD-10-CM | POA: Diagnosis not present

## 2024-02-24 DIAGNOSIS — Z7952 Long term (current) use of systemic steroids: Secondary | ICD-10-CM | POA: Diagnosis not present

## 2024-02-24 DIAGNOSIS — Z5111 Encounter for antineoplastic chemotherapy: Secondary | ICD-10-CM | POA: Diagnosis not present

## 2024-02-24 DIAGNOSIS — E538 Deficiency of other specified B group vitamins: Secondary | ICD-10-CM | POA: Diagnosis not present

## 2024-02-24 DIAGNOSIS — Z5112 Encounter for antineoplastic immunotherapy: Secondary | ICD-10-CM | POA: Diagnosis not present

## 2024-02-24 DIAGNOSIS — Z79899 Other long term (current) drug therapy: Secondary | ICD-10-CM | POA: Diagnosis not present

## 2024-02-24 LAB — CMP (CANCER CENTER ONLY)
ALT: 23 U/L (ref 0–44)
AST: 21 U/L (ref 15–41)
Albumin: 3.8 g/dL (ref 3.5–5.0)
Alkaline Phosphatase: 73 U/L (ref 38–126)
Anion gap: 8 (ref 5–15)
BUN: 14 mg/dL (ref 8–23)
CO2: 29 mmol/L (ref 22–32)
Calcium: 9.3 mg/dL (ref 8.9–10.3)
Chloride: 105 mmol/L (ref 98–111)
Creatinine: 0.98 mg/dL (ref 0.61–1.24)
GFR, Estimated: >60 mL/min (ref >60)
Glucose, Bld: 96 mg/dL (ref 70–99)
Potassium: 4 mmol/L (ref 3.5–5.1)
Sodium: 142 mmol/L (ref 135–145)
Total Bilirubin: 0.2 mg/dL (ref 0.0–1.2)
Total Protein: 6.8 g/dL (ref 6.5–8.1)

## 2024-02-24 LAB — CBC WITH DIFFERENTIAL (CANCER CENTER ONLY)
Abs Immature Granulocytes: 0.06 10*3/uL (ref 0.00–0.07)
Basophils Absolute: 0.1 10*3/uL (ref 0.0–0.1)
Basophils Relative: 1 %
Eosinophils Absolute: 0 10*3/uL (ref 0.0–0.5)
Eosinophils Relative: 0 %
HCT: 37.3 % — ABNORMAL LOW (ref 39.0–52.0)
Hemoglobin: 11.5 g/dL — ABNORMAL LOW (ref 13.0–17.0)
Immature Granulocytes: 1 %
Lymphocytes Relative: 11 %
Lymphs Abs: 1.2 10*3/uL (ref 0.7–4.0)
MCH: 25.7 pg — ABNORMAL LOW (ref 26.0–34.0)
MCHC: 30.8 g/dL (ref 30.0–36.0)
MCV: 83.4 fL (ref 80.0–100.0)
Monocytes Absolute: 0.4 10*3/uL (ref 0.1–1.0)
Monocytes Relative: 3 %
Neutro Abs: 9.3 10*3/uL — ABNORMAL HIGH (ref 1.7–7.7)
Neutrophils Relative %: 84 %
Platelet Count: 397 10*3/uL (ref 150–400)
RBC: 4.47 MIL/uL (ref 4.22–5.81)
RDW: 20.3 % — ABNORMAL HIGH (ref 11.5–15.5)
WBC Count: 11 10*3/uL — ABNORMAL HIGH (ref 4.0–10.5)
nRBC: 0 % (ref 0.0–0.2)

## 2024-02-24 MED ORDER — SODIUM CHLORIDE 0.9 % IV SOLN: Freq: Once | INTRAVENOUS | Status: AC

## 2024-02-24 MED ORDER — SODIUM CHLORIDE 0.9 % IV SOLN
500.0000 mg/m2 | Freq: Once | INTRAVENOUS | Status: AC
  Administered 2024-02-24: 1000 mg via INTRAVENOUS
  Filled 2024-02-24: qty 40

## 2024-02-24 MED ORDER — PROCHLORPERAZINE MALEATE 10 MG PO TABS
10.0000 mg | ORAL_TABLET | Freq: Once | ORAL | Status: AC
  Administered 2024-02-24: 10 mg via ORAL
  Filled 2024-02-24: qty 1

## 2024-02-24 MED ORDER — CYANOCOBALAMIN 1000 MCG/ML IJ SOLN
1000.0000 ug | Freq: Once | INTRAMUSCULAR | Status: AC
  Administered 2024-02-24: 1000 ug via INTRAMUSCULAR
  Filled 2024-02-24: qty 1

## 2024-02-24 MED ORDER — SODIUM CHLORIDE 0.9 % IV SOLN
200.0000 mg | Freq: Once | INTRAVENOUS | Status: AC
  Administered 2024-02-24: 200 mg via INTRAVENOUS
  Filled 2024-02-24: qty 200

## 2024-02-24 NOTE — Progress Notes (Signed)
 [facility] [phone]   [phone]) [phone]  OFFICE PROGRESS NOTE  Bradley Jules, FNP [address] [place] [address]  DIAGNOSIS: Stage IV (T1b, N3, M1C) non-small cell lung cancer, favoring adenocarcinoma presented with right upper lobe lung nodule in addition to right hilar, subcarinal and bilateral mediastinal as well as supraclavicular lymphadenopathy in addition to bone and brain metastasis diagnosed in June 2022.     PD-L1 expression 80%.     Molecular Studies:  Biomarker Findings Microsatellite status - MS-Stable Tumor Mutational Burden - 6 Muts/Mb Genomic Findings For a complete list of the genes assayed, please refer to the Appendix. KRAS G12C, amplification ATM S470* CCND1 amplification - equivocal? HGF amplification - equivocal? MYC amplification - equivocal? FGF19 amplification - equivocal? FGF3 amplification - equivocal? FGF4 amplification - equivocal? NFKBIA amplification NKX2-1 amplification RAD21 amplification - equivocal? [id number]*26 TERT promoter -124C>T TP53 rearrangement exon 9 7 Disease relevant genes with no reportable alterations: ALK, BRAF, EGFR, ERBB2, MET, RET, ROS1   PRIOR THERAPY: SRS to the metastatic brain lesions under the care of Dr. Lorri Rota.  Last treatment on 05/04/2021.   CURRENT THERAPY: Palliative systemic chemotherapy with carboplatin  for an AUC 5, Alimta  500 mg/m2 and, Keytruda  200 mg IV every 3 weeks.  First dose on 05/08/2021.  Status post 47 cycles.  Starting from cycle #5 he is on maintenance treatment with Alimta  and Keytruda  every 3 weeks.  INTERVAL HISTORY: Bradley Goodman 68 y.o. male returns to the clinic today for follow-up visit accompanied by his wife.Discussed the use of AI scribe software for clinical note transcription with the patient, who gave verbal consent to proceed.  History of Present Illness   He is a 68 year old male with stage four non-small cell lung cancer who  presents for evaluation before starting cycle number 48 of maintenance chemotherapy. He is accompanied by his wife.  He was diagnosed with stage four non-small cell lung cancer, adenocarcinoma, in June 2022, characterized by a positive KRAS G12C mutation and PD-L1 expression of 80%. He has undergone stereotactic radiosurgery for a metastatic brain lesion.  Initially, he received palliative systemic chemotherapy with carboplatin , Alimta , and Keytruda  for four cycles, followed by maintenance treatment with Alimta  and Keytruda  every three weeks. He is currently status post 47 cycles and is here for evaluation before starting cycle number 48.  He feels good with no new complaints since the last visit. His wife confirms that he is doing well at home. No chest pain, breathing issues, nausea, vomiting, or diarrhea. He is actively engaged in gardening.        MEDICAL HISTORY: 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   PONV (postoperative nausea and vomiting)    Type 2 diabetes mellitus (HCC)     ALLERGIES:  has no known allergies.  MEDICATIONS:  Current Outpatient Medications  Medication Sig Dispense Refill   acetaminophen  (TYLENOL ) 500 MG tablet Take 1,000 mg by mouth every 6 (six) hours as needed for mild pain (pain score 1-3) or fever.     chlorproMAZINE  (THORAZINE ) 10 MG tablet Take 1 tablet (10 mg total) by mouth 3 (three) times daily as needed for hiccoughs. 90 tablet 0   cholecalciferol  (  VITAMIN D ) 1000 UNITS tablet Take 1,000 Units by mouth every evening.     Continuous Glucose Sensor (FREESTYLE LIBRE 3 SENSOR) MISC Place 1 sensor on the skin every 14 days. Use to check glucose continuously 6 each 3   dexamethasone  (DECADRON ) 1 MG tablet Take 1 tablet (1 mg total) by mouth  daily with breakfast. 90 tablet 1   docusate sodium  (COLACE) 100 MG capsule Take 100 mg by mouth daily.     folic acid  (FOLVITE ) 1 MG tablet Take 1 tablet (1 mg total) by mouth daily. 90 tablet 0   Krill Oil 300 MG CAPS Take 300 mg by mouth every evening.     levothyroxine  (SYNTHROID ) 88 MCG tablet Take 1 tablet (88 mcg total) by mouth daily before breakfast. 90 tablet 1   loratadine  (CLARITIN ) 10 MG tablet Take 10 mg by mouth every evening.      losartan  (COZAAR ) 50 MG tablet Take 1.5 tablets (75 mg total) by mouth daily. 135 tablet 0   mirtazapine  (REMERON ) 15 MG tablet Take 1 tablet (15 mg total) by mouth at bedtime. 90 tablet 1   Multiple Vitamins-Minerals (MULTIVITAMIN GUMMIES ADULT PO) Take 2 each by mouth daily.     ondansetron  (ZOFRAN ) 4 MG tablet Take 1 tablet (4 mg total) by mouth every 6 (six) hours as needed for nausea. 20 tablet 0   pantoprazole  (PROTONIX ) 40 MG tablet Take 1 tablet (40 mg total) by mouth every evening. 90 tablet 1   polyethylene glycol powder (GLYCOLAX/MIRALAX) 17 GM/SCOOP powder Take 119 g by mouth daily as needed.     PRESCRIPTION MEDICATION Testosterone  injection     rosuvastatin  (CRESTOR ) 20 MG tablet Take 1 tablet (20 mg total) by mouth daily. 90 tablet 1   SYRINGE-NEEDLE, DISP, 3 ML (B-D 3CC LUER-LOK SYR 21GX1-1/2) 21G X 1-1/2" 3 ML MISC Use to inject testosterone  every week 100 each 2   testosterone  cypionate (DEPOTESTOTERONE CYPIONATE) 100 MG/ML injection Take 50mg  ( 0.74ml) and 100mg  ( 1ml) into muscle alternatively weekly. 10 mL 1   venlafaxine  XR (EFFEXOR -XR) 75 MG 24 hr capsule Take 2 capsules (150 mg total) by mouth daily with breakfast. 180 capsule 2   No current facility-administered medications for this visit.    SURGICAL HISTORY:  Past Surgical History:  Procedure Laterality Date   Bilateral inguinal hernia repair     BRONCHIAL NEEDLE ASPIRATION BIOPSY  04/05/2021   Procedure: BRONCHIAL NEEDLE ASPIRATION BIOPSIES;  Surgeon: Prudy Brownie, DO;   Location: [facility];  Service: Pulmonary;;   COLONOSCOPY  01/23/2012   Procedure: COLONOSCOPY;  Surgeon: Suzette Espy, MD;  Location: [facility];  Service: Endoscopy;  Laterality: N/A;  9:30 AM   COLONOSCOPY N/A 10/11/2015   Procedure: COLONOSCOPY;  Surgeon: Suzette Espy, MD;  Location: [facility];  Service: Endoscopy;  Laterality: N/A;  830   COLONOSCOPY N/A 10/14/2019   Procedure: COLONOSCOPY;  Surgeon: Suzette Espy, MD;  Location: [facility];  Service: Endoscopy;  Laterality: N/A;  1:45   POLYPECTOMY  10/14/2019   Procedure: POLYPECTOMY;  Surgeon: Suzette Espy, MD;  Location: [facility];  Service: Endoscopy;;  ascending colon, descending colon   VIDEO BRONCHOSCOPY WITH ENDOBRONCHIAL ULTRASOUND N/A 04/05/2021   Procedure: VIDEO BRONCHOSCOPY WITH ENDOBRONCHIAL ULTRASOUND;  Surgeon: Prudy Brownie, DO;  Location: [facility];  Service: Pulmonary;  Laterality: N/A;    REVIEW OF SYSTEMS:  A comprehensive review of systems was negative.   PHYSICAL EXAMINATION: General appearance: alert,  cooperative, appears stated age, and no distress Head: Normocephalic, without obvious abnormality, atraumatic Neck: no adenopathy, no JVD, supple, symmetrical, trachea midline, and thyroid  not enlarged, symmetric, no tenderness/mass/nodules Lymph nodes: Cervical, supraclavicular, and axillary nodes normal. Resp: clear to auscultation bilaterally Back: symmetric, no curvature. ROM normal. No CVA tenderness. Cardio: regular rate and rhythm, S1, S2 normal, no murmur, click, rub or gallop GI: soft, non-tender; bowel sounds normal; no masses,  no organomegaly Extremities: extremities normal, atraumatic, no cyanosis or edema  ECOG PERFORMANCE STATUS: 1 - Symptomatic but completely ambulatory  There were no vitals taken for this visit.  LABORATORY DATA: Lab Results  Component Value Date   WBC 9.6 02/03/2024   HGB 11.3 (L) 02/03/2024   HCT 37.9 (L) 02/03/2024   MCV 85.7 02/03/2024   PLT  616 (H) 02/03/2024      Chemistry      Component Value Date/Time   NA 142 02/03/2024 1254   NA 144 11/26/2023 1135   K 4.2 02/03/2024 1254   CL 108 02/03/2024 1254   CO2 29 02/03/2024 1254   BUN 12 02/03/2024 1254   BUN 11 11/26/2023 1135   CREATININE 0.91 02/03/2024 1254      Component Value Date/Time   CALCIUM  9.0 02/03/2024 1254   ALKPHOS 66 02/03/2024 1254   AST 19 02/03/2024 1254   ALT 16 02/03/2024 1254   BILITOT 0.2 02/03/2024 1254       RADIOGRAPHIC STUDIES: MR BRAIN W WO CONTRAST Result Date: 02/13/2024 CLINICAL DATA:  Provided history: Malignant neoplasm metastatic to brain. Brain/CNS neoplasm, assess treatment response. EXAM: MRI HEAD WITHOUT AND WITH CONTRAST TECHNIQUE: Multiplanar, multiecho pulse sequences of the brain and surrounding structures were obtained without and with intravenous contrast. CONTRAST:  8 mL Vueway  intravenous contrast. COMPARISON:  Prior brain MRI examinations 08/15/2023 and earlier. FINDINGS: Brain: No age-advanced or lobar predominant cerebral atrophy. Punctate focus of enhancement within the posterior left frontal lobe, not appreciated on prior examinations and indeterminate for a new metastasis versus vascular enhancement (series 13, image 138). Continued slight interval increase in size of an enhancing lesion within the right parietal lobe, now measuring 3.5 mm (previously 3 mm) (series 13, image 84). Punctate enhancing lesion within the left cerebellar hemisphere, unchanged (series 13, image 46). Moderate for age multifocal T2 FLAIR hyperintense signal abnormality within the cerebral white matter, nonspecific but compatible with chronic small vessel ischemic disease. No cortical encephalomalacia is identified. There is no acute infarct. No chronic intracranial blood products. No extra-axial fluid collection. No midline shift. Vascular: Maintained flow voids within the proximal large arterial vessels. Skull and upper cervical spine: No focal  worrisome marrow lesion. Sinuses/Orbits: No mass or acute finding within the imaged orbits. Mild mucosal thickening within the bilateral ethmoid, right sphenoid and right maxillary sinuses. Other: Small right mastoid effusion. Impression #1 will be called to the ordering clinician or representative by the Radiologist Assistant, and communication documented in the PACS or [employer]. IMPRESSION: 1. Punctate focus of enhancement within the posterior left frontal lobe, not appreciated on prior examinations and indeterminate for a new metastasis versus vascular enhancement. A short-interval follow-up brain MRI (with and without contrast) is recommended for surveillance. 2. Continued slight increase in size of an enhancing lesion within the right parietal lobe, now measuring 3.5 mm (previously 3 mm). 3. Unchanged punctate enhancing lesion within the left cerebellar hemisphere. 4. Moderate cerebral white matter chronic small vessel ischemic disease. 5. Mild paranasal sinus mucosal thickening. 6. Small right mastoid effusion. Electronically  Signed   By: Bascom Lily D.O.   On: 02/13/2024 10:12   CT Chest W Contrast Result Date: 01/31/2024 CLINICAL DATA:  Staging non-small-cell lung cancer. Diagnosed in 2022. Chemotherapy and immunotherapy in progress. Completed XRT. * Tracking Code: BO * EXAM: CT CHEST WITH CONTRAST TECHNIQUE: Multidetector CT imaging of the chest was performed during intravenous contrast administration. RADIATION DOSE REDUCTION: This exam was performed according to the departmental dose-optimization program which includes automated exposure control, adjustment of the mA and/or kV according to patient size and/or use of iterative reconstruction technique. CONTRAST:  75mL OMNIPAQUE  IOHEXOL  300 MG/ML  SOLN COMPARISON:  CT chest 11/11/2023 and older. Abdomen pelvis CT 01/18/2024. Chest x-ray 01/22/2024. FINDINGS: Cardiovascular: Heart is nonenlarged. Small pericardial effusion is stable. The thoracic  aorta has a normal course and caliber with some partially calcified atherosclerotic plaque diffusely. Coronary artery calcifications are noted. There is plaque along the great vessels. Of note there is a variant with a early origin of the left vertebral artery. Mediastinum/Nodes: Preserved thyroid  gland. Patulous esophagus with some mild areas of wall thickening, unchanged from previous. No specific abnormal lymph node enlargement identified in the axillary regions, hilum or mediastinum. Small right hilar node again identified measuring 7 mm in short axis on series 2, image 67. Small subcarinal node as well is stable. Posterior mediastinal nodes are also seen at that are small but prominent. Lungs/Pleura: Bowel apical pleural thickening. Some paraseptal lung changes identified. No consolidation, pneumothorax or effusion. Once again multiple nodular areas identified. In the left upper lobe there is a ground-glass area laterally measuring 5 mm which is stable today on series 8, image 84. The area just posteromedial to this measuring 3 mm also stable but slightly less confluent. The area medially in the left upper lobe more superiorly measuring 4 mm on image 66 of series 8 is stable. Additional small area anterior to this on image 68 is stable as well. No new dominant left-sided lung nodule. The right lung also had numerous areas of ill-defined nodularity. The more confluent areas in the posterior right upper lobe are more faint today with some residual ground-glass. Example posteromedial right upper lobe which measured 9 mm on the prior examination, today has some minimal ill-defined ground-glass measuring 7 mm on image 55. Other lesions more lateral to this are also improved. There are some lesions are stable including the ground-glass lesion laterally in the posterior right upper lobe measuring 6 mm on series 8, image 50. Area more caudal right upper lobe which measured 6 mm previously, today is in ground-glass  measuring 7 mm on image 71. Upper Abdomen: Adrenal glands are preserved in the upper abdomen. Musculoskeletal: Slight curvature of the spine with some degenerative changes. IMPRESSION: Multiple ill-defined opacities are nodular in the right upper lobe on the previous examination show improvement with some residual areas of ground-glass. There are a few other ground-glass areas which are stable in both upper lobes. No new lung lesions identified. Recommend continued follow up in 3 months. Stable small mediastinal and right hilar nodes. Stable pericardial effusion. Aortic Atherosclerosis (ICD10-I70.0) and Emphysema (ICD10-J43.9). Electronically Signed   By: Adrianna Horde M.D.   On: 01/31/2024 17:02     ASSESSMENT AND PLAN: This is a very pleasant 68 years old white male recently diagnosed with stage IV (T1b, N3, M1 C) non-small cell lung cancer favoring adenocarcinoma presented with right upper lobe lung nodule in addition to right hilar, subcarinal and bilateral mediastinal as well as supraclavicular lymphadenopathy.  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  His PD-L1 expression is 80% and his molecular studies showed KRAS G12C mutation. The patient underwent SRS to metastatic brain lesion under the care of Dr. Lorri Rota and he is currently undergoing systemic chemotherapy with carboplatin  for AUC of 5, Alimta  500 Mg/M2 and Keytruda  200 Mg IV every 3 weeks status post 47 cycles.  Starting from cycle #5 the patient will be treated with maintenance treatment with Alimta  and Keytruda  every 3 weeks.  The patient continues to tolerate this treatment fairly well. Assessment and Plan    Stage 4 non-small cell lung cancer, adenocarcinoma Stage 4 non-small cell lung cancer, adenocarcinoma with KRAS G12C mutation and PD-L1 expression of 80%. Status post SRS to metastatic brain lesion. Currently on maintenance treatment with Alimta  and Keytruda , having completed 47 cycles. Blood counts  stable with hemoglobin at 11.5. No evidence of disease progression or new metastases. No new complaints or symptoms. - Proceed with cycle 48 of Alimta  and Keytruda  today. - Schedule follow-up in three weeks. - Instruct to call if any issues arise.  Metastatic brain lesion Metastatic brain lesion treated with SRS. No new neurological symptoms. No evidence of recurrence.   The patient was advised to call immediately if he has any concerning symptoms in the interval. The patient voices understanding of current disease status and treatment options and is in agreement with the current care plan.  All questions were answered. The patient knows to call the clinic with any problems, questions or concerns. We can certainly see the patient much sooner if necessary. The total time spent in the appointment was 20 minutes.  Disclaimer: This note was dictated with voice recognition software. Similar sounding words can inadvertently be transcribed and may not be corrected upon review.

## 2024-02-24 NOTE — Patient Instructions (Signed)
 [facility] - A DEPT OF [facility]  Discharge Instructions: Thank you for choosing [facility] to provide your oncology and hematology care.   If you have a lab appointment with the Cancer Center, please go directly to the Cancer Center and check in at the registration area.   Wear comfortable clothing and clothing appropriate for easy access to any Portacath or PICC line.   We strive to give you quality time with your provider. You may need to reschedule your appointment if you arrive late (15 or more minutes).  Arriving late affects you and other patients whose appointments are after yours.  Also, if you miss three or more appointments without notifying the office, you may be dismissed from the clinic at the provider's discretion.      For prescription refill requests, have your pharmacy contact our office and allow 72 hours for refills to be completed.    Today you received the following chemotherapy and/or immunotherapy agents keytruda , alimta , B12      To help prevent nausea and vomiting after your treatment, we encourage you to take your nausea medication as directed.  BELOW ARE SYMPTOMS THAT SHOULD BE REPORTED IMMEDIATELY: *FEVER GREATER THAN 100.4 F (38 C) OR HIGHER *CHILLS OR SWEATING *NAUSEA AND VOMITING THAT IS NOT CONTROLLED WITH YOUR NAUSEA MEDICATION *UNUSUAL SHORTNESS OF BREATH *UNUSUAL BRUISING OR BLEEDING *URINARY PROBLEMS (pain or burning when urinating, or frequent urination) *BOWEL PROBLEMS (unusual diarrhea, constipation, pain near the anus) TENDERNESS IN MOUTH AND THROAT WITH OR WITHOUT PRESENCE OF ULCERS (sore throat, sores in mouth, or a toothache) UNUSUAL RASH, SWELLING OR PAIN  UNUSUAL VAGINAL DISCHARGE OR ITCHING   Items with * indicate a potential emergency and should be followed up as soon as possible or go to the Emergency Department if any problems should occur.  Please show the CHEMOTHERAPY ALERT CARD or  IMMUNOTHERAPY ALERT CARD at check-in to the Emergency Department and triage nurse.  Should you have questions after your visit or need to cancel or reschedule your appointment, please contact [facility] - A DEPT OF [facility]  Dept: [phone]  and follow the prompts.  Office hours are 8:00 a.m. to 4:30 p.m. Monday - Friday. Please note that voicemails left after 4:00 p.m. may not be returned until the following business day.  We are closed weekends and major holidays. You have access to a nurse at all times for urgent questions. Please call the main number to the clinic Dept: [phone] and follow the prompts.   For any non-urgent questions, you may also contact your provider using MyChart. We now offer e-Visits for anyone 86 and older to request care online for non-urgent symptoms. For details visit [email or web address].   Also download the MyChart app! Go to the app store, search "MyChart", open the app, select [facility], and log in with your MyChart username and password.

## 2024-02-29 ENCOUNTER — Inpatient Hospital Stay (HOSPITAL_COMMUNITY)
Admission: EM | Admit: 2024-02-29 | Discharge: 2024-03-03 | DRG: 394 | Disposition: A | Discharge status: Home / Self Care | Attending: Internal Medicine | Admitting: Internal Medicine

## 2024-02-29 ENCOUNTER — Other Ambulatory Visit: Payer: Self-pay

## 2024-02-29 ENCOUNTER — Emergency Department (HOSPITAL_COMMUNITY)

## 2024-02-29 ENCOUNTER — Encounter (HOSPITAL_COMMUNITY): Payer: Self-pay | Admitting: *Deleted

## 2024-02-29 DIAGNOSIS — K219 Gastro-esophageal reflux disease without esophagitis: Secondary | ICD-10-CM | POA: Diagnosis present

## 2024-02-29 DIAGNOSIS — K521 Toxic gastroenteritis and colitis: Secondary | ICD-10-CM | POA: Diagnosis not present

## 2024-02-29 DIAGNOSIS — R110 Nausea: Secondary | ICD-10-CM | POA: Diagnosis present

## 2024-02-29 DIAGNOSIS — Z87442 Personal history of urinary calculi: Secondary | ICD-10-CM

## 2024-02-29 DIAGNOSIS — D630 Anemia in neoplastic disease: Secondary | ICD-10-CM | POA: Diagnosis not present

## 2024-02-29 DIAGNOSIS — E860 Dehydration: Secondary | ICD-10-CM | POA: Diagnosis not present

## 2024-02-29 DIAGNOSIS — I10 Essential (primary) hypertension: Secondary | ICD-10-CM | POA: Diagnosis present

## 2024-02-29 DIAGNOSIS — R531 Weakness: Secondary | ICD-10-CM | POA: Diagnosis not present

## 2024-02-29 DIAGNOSIS — E1159 Type 2 diabetes mellitus with other circulatory complications: Secondary | ICD-10-CM | POA: Diagnosis present

## 2024-02-29 DIAGNOSIS — A419 Sepsis, unspecified organism: Secondary | ICD-10-CM | POA: Diagnosis not present

## 2024-02-29 DIAGNOSIS — E876 Hypokalemia: Secondary | ICD-10-CM | POA: Diagnosis not present

## 2024-02-29 DIAGNOSIS — E871 Hypo-osmolality and hyponatremia: Secondary | ICD-10-CM | POA: Diagnosis not present

## 2024-02-29 DIAGNOSIS — Z8249 Family history of ischemic heart disease and other diseases of the circulatory system: Secondary | ICD-10-CM

## 2024-02-29 DIAGNOSIS — Z833 Family history of diabetes mellitus: Secondary | ICD-10-CM

## 2024-02-29 DIAGNOSIS — E2740 Unspecified adrenocortical insufficiency: Secondary | ICD-10-CM | POA: Diagnosis not present

## 2024-02-29 DIAGNOSIS — D649 Anemia, unspecified: Secondary | ICD-10-CM

## 2024-02-29 DIAGNOSIS — F3340 Major depressive disorder, recurrent, in remission, unspecified: Secondary | ICD-10-CM | POA: Diagnosis not present

## 2024-02-29 DIAGNOSIS — Z0389 Encounter for observation for other suspected diseases and conditions ruled out: Secondary | ICD-10-CM | POA: Diagnosis not present

## 2024-02-29 DIAGNOSIS — I152 Hypertension secondary to endocrine disorders: Secondary | ICD-10-CM | POA: Diagnosis present

## 2024-02-29 DIAGNOSIS — D84821 Immunodeficiency due to drugs: Secondary | ICD-10-CM | POA: Diagnosis not present

## 2024-02-29 DIAGNOSIS — Z79890 Hormone replacement therapy (postmenopausal): Secondary | ICD-10-CM

## 2024-02-29 DIAGNOSIS — R6510 Systemic inflammatory response syndrome (SIRS) of non-infectious origin without acute organ dysfunction: Secondary | ICD-10-CM | POA: Diagnosis not present

## 2024-02-29 DIAGNOSIS — R0981 Nasal congestion: Secondary | ICD-10-CM | POA: Diagnosis present

## 2024-02-29 DIAGNOSIS — C3491 Malignant neoplasm of unspecified part of right bronchus or lung: Secondary | ICD-10-CM | POA: Diagnosis present

## 2024-02-29 DIAGNOSIS — R059 Cough, unspecified: Secondary | ICD-10-CM | POA: Diagnosis present

## 2024-02-29 DIAGNOSIS — F339 Major depressive disorder, recurrent, unspecified: Secondary | ICD-10-CM | POA: Diagnosis not present

## 2024-02-29 DIAGNOSIS — C7931 Secondary malignant neoplasm of brain: Secondary | ICD-10-CM | POA: Diagnosis present

## 2024-02-29 DIAGNOSIS — E039 Hypothyroidism, unspecified: Secondary | ICD-10-CM | POA: Diagnosis not present

## 2024-02-29 DIAGNOSIS — Z794 Long term (current) use of insulin: Secondary | ICD-10-CM

## 2024-02-29 DIAGNOSIS — M4302 Spondylolysis, cervical region: Secondary | ICD-10-CM | POA: Diagnosis present

## 2024-02-29 DIAGNOSIS — Z7952 Long term (current) use of systemic steroids: Secondary | ICD-10-CM

## 2024-02-29 DIAGNOSIS — R509 Fever, unspecified: Secondary | ICD-10-CM | POA: Diagnosis present

## 2024-02-29 DIAGNOSIS — F17210 Nicotine dependence, cigarettes, uncomplicated: Secondary | ICD-10-CM | POA: Diagnosis present

## 2024-02-29 DIAGNOSIS — E785 Hyperlipidemia, unspecified: Secondary | ICD-10-CM | POA: Diagnosis present

## 2024-02-29 DIAGNOSIS — E119 Type 2 diabetes mellitus without complications: Secondary | ICD-10-CM | POA: Diagnosis not present

## 2024-02-29 DIAGNOSIS — T451X5A Adverse effect of antineoplastic and immunosuppressive drugs, initial encounter: Secondary | ICD-10-CM | POA: Diagnosis present

## 2024-02-29 DIAGNOSIS — Z79899 Other long term (current) drug therapy: Secondary | ICD-10-CM

## 2024-02-29 LAB — COMPREHENSIVE METABOLIC PANEL WITH GFR
ALT: 36 U/L (ref 0–44)
AST: 37 U/L (ref 15–41)
Albumin: 3 g/dL — ABNORMAL LOW (ref 3.5–5.0)
Alkaline Phosphatase: 86 U/L (ref 38–126)
Anion gap: 8 (ref 5–15)
BUN: 12 mg/dL (ref 8–23)
CO2: 22 mmol/L (ref 22–32)
Calcium: 8.4 mg/dL — ABNORMAL LOW (ref 8.9–10.3)
Chloride: 102 mmol/L (ref 98–111)
Creatinine, Ser: 1.09 mg/dL (ref 0.61–1.24)
GFR, Estimated: >60 mL/min (ref >60)
Glucose, Bld: 123 mg/dL — ABNORMAL HIGH (ref 70–99)
Potassium: 3.9 mmol/L (ref 3.5–5.1)
Sodium: 132 mmol/L — ABNORMAL LOW (ref 135–145)
Total Bilirubin: 1.3 mg/dL — ABNORMAL HIGH (ref 0.0–1.2)
Total Protein: 6.8 g/dL (ref 6.5–8.1)

## 2024-02-29 LAB — URINALYSIS, W/ REFLEX TO CULTURE (INFECTION SUSPECTED)
Bacteria, UA: NONE SEEN
Bilirubin Urine: NEGATIVE
Glucose, UA: NEGATIVE mg/dL
Hgb urine dipstick: NEGATIVE
Ketones, ur: 5 mg/dL — AB
Leukocytes,Ua: NEGATIVE
Nitrite: NEGATIVE
Protein, ur: 100 mg/dL — AB
Specific Gravity, Urine: 1.016 (ref 1.005–1.030)
pH: 7 (ref 5.0–8.0)

## 2024-02-29 LAB — CBC WITH DIFFERENTIAL/PLATELET
Abs Immature Granulocytes: 0.08 10*3/uL — ABNORMAL HIGH (ref 0.00–0.07)
Basophils Absolute: 0.1 10*3/uL (ref 0.0–0.1)
Basophils Relative: 1 %
Eosinophils Absolute: 0.1 10*3/uL (ref 0.0–0.5)
Eosinophils Relative: 1 %
HCT: 33.5 % — ABNORMAL LOW (ref 39.0–52.0)
Hemoglobin: 10.2 g/dL — ABNORMAL LOW (ref 13.0–17.0)
Immature Granulocytes: 1 %
Lymphocytes Relative: 9 %
Lymphs Abs: 1.1 10*3/uL (ref 0.7–4.0)
MCH: 25.8 pg — ABNORMAL LOW (ref 26.0–34.0)
MCHC: 30.4 g/dL (ref 30.0–36.0)
MCV: 84.6 fL (ref 80.0–100.0)
Monocytes Absolute: 0.4 10*3/uL (ref 0.1–1.0)
Monocytes Relative: 4 %
Neutro Abs: 10 10*3/uL — ABNORMAL HIGH (ref 1.7–7.7)
Neutrophils Relative %: 84 %
Platelets: 329 10*3/uL (ref 150–400)
RBC: 3.96 MIL/uL — ABNORMAL LOW (ref 4.22–5.81)
RDW: 19.5 % — ABNORMAL HIGH (ref 11.5–15.5)
WBC: 11.7 10*3/uL — ABNORMAL HIGH (ref 4.0–10.5)
nRBC: 0 % (ref 0.0–0.2)

## 2024-02-29 LAB — RESP PANEL BY RT-PCR (RSV, FLU A&B, COVID)  RVPGX2
Influenza A by PCR: NEGATIVE
Influenza B by PCR: NEGATIVE
Resp Syncytial Virus by PCR: NEGATIVE
SARS Coronavirus 2 by RT PCR: NEGATIVE

## 2024-02-29 LAB — LACTIC ACID, PLASMA
Lactic Acid, Venous: 1.2 mmol/L (ref 0.5–1.9)
Lactic Acid, Venous: 1.4 mmol/L (ref 0.5–1.9)

## 2024-02-29 LAB — PROTIME-INR
INR: 1.2 (ref 0.8–1.2)
Prothrombin Time: 15.2 s (ref 11.4–15.2)

## 2024-02-29 MED ORDER — LORATADINE 10 MG PO TABS
10.0000 mg | ORAL_TABLET | Freq: Every evening | ORAL | Status: DC
  Administered 2024-02-29 – 2024-03-02 (×3): 10 mg via ORAL
  Filled 2024-02-29 (×3): qty 1

## 2024-02-29 MED ORDER — PANTOPRAZOLE SODIUM 40 MG PO TBEC
40.0000 mg | DELAYED_RELEASE_TABLET | Freq: Every evening | ORAL | Status: DC
  Administered 2024-02-29 – 2024-03-02 (×3): 40 mg via ORAL
  Filled 2024-02-29 (×3): qty 1

## 2024-02-29 MED ORDER — PIPERACILLIN-TAZOBACTAM 3.375 G IVPB 30 MIN
3.3750 g | Freq: Once | INTRAVENOUS | Status: AC
  Administered 2024-02-29: 3.375 g via INTRAVENOUS
  Filled 2024-02-29: qty 50

## 2024-02-29 MED ORDER — LACTATED RINGERS IV SOLN: INTRAVENOUS | Status: DC

## 2024-02-29 MED ORDER — LOSARTAN POTASSIUM 50 MG PO TABS
75.0000 mg | ORAL_TABLET | Freq: Every day | ORAL | Status: DC
  Administered 2024-02-29: 75 mg via ORAL
  Filled 2024-02-29: qty 2

## 2024-02-29 MED ORDER — LACTATED RINGERS IV BOLUS (SEPSIS)
1000.0000 mL | Freq: Once | INTRAVENOUS | Status: AC
  Administered 2024-02-29: 1000 mL via INTRAVENOUS

## 2024-02-29 MED ORDER — ACETAMINOPHEN 650 MG RE SUPP: 650.0000 mg | Freq: Four times a day (QID) | RECTAL | Status: DC | PRN

## 2024-02-29 MED ORDER — CEFEPIME HCL 2 G IV SOLR
2.0000 g | Freq: Three times a day (TID) | INTRAVENOUS | Status: DC
  Administered 2024-02-29 – 2024-03-03 (×9): 2 g via INTRAVENOUS
  Filled 2024-02-29 (×9): qty 12.5

## 2024-02-29 MED ORDER — ACETAMINOPHEN 325 MG PO TABS
650.0000 mg | ORAL_TABLET | Freq: Four times a day (QID) | ORAL | Status: DC | PRN
  Administered 2024-03-01: 650 mg via ORAL
  Filled 2024-02-29: qty 2

## 2024-02-29 MED ORDER — METRONIDAZOLE 500 MG/100ML IV SOLN
500.0000 mg | Freq: Two times a day (BID) | INTRAVENOUS | Status: DC
  Administered 2024-02-29 – 2024-03-03 (×6): 500 mg via INTRAVENOUS
  Filled 2024-02-29 (×6): qty 100

## 2024-02-29 MED ORDER — DEXAMETHASONE 0.5 MG PO TABS
1.0000 mg | ORAL_TABLET | Freq: Every day | ORAL | Status: DC
  Administered 2024-03-01: 1 mg via ORAL
  Filled 2024-02-29 (×2): qty 2

## 2024-02-29 MED ORDER — LEVOTHYROXINE SODIUM 88 MCG PO TABS
88.0000 ug | ORAL_TABLET | Freq: Every day | ORAL | Status: DC
  Administered 2024-03-01 – 2024-03-03 (×3): 88 ug via ORAL
  Filled 2024-02-29 (×3): qty 1

## 2024-02-29 MED ORDER — VANCOMYCIN HCL 1750 MG/350ML IV SOLN
1750.0000 mg | Freq: Once | INTRAVENOUS | Status: AC
  Administered 2024-02-29: 1750 mg via INTRAVENOUS
  Filled 2024-02-29 (×2): qty 350

## 2024-02-29 MED ORDER — ONDANSETRON HCL 4 MG/2ML IJ SOLN
4.0000 mg | Freq: Four times a day (QID) | INTRAMUSCULAR | Status: DC | PRN
  Administered 2024-02-29: 4 mg via INTRAVENOUS
  Filled 2024-02-29 (×3): qty 2

## 2024-02-29 MED ORDER — SODIUM CHLORIDE 0.9 % IV SOLN: INTRAVENOUS | Status: DC

## 2024-02-29 MED ORDER — LACTATED RINGERS IV BOLUS (SEPSIS)
500.0000 mL | Freq: Once | INTRAVENOUS | Status: AC
  Administered 2024-02-29: 500 mL via INTRAVENOUS

## 2024-02-29 MED ORDER — CALCIUM CARBONATE ANTACID 500 MG PO CHEW
1.0000 | CHEWABLE_TABLET | Freq: Three times a day (TID) | ORAL | Status: DC | PRN
  Administered 2024-02-29: 200 mg via ORAL
  Filled 2024-02-29: qty 1

## 2024-02-29 MED ORDER — ENOXAPARIN SODIUM 40 MG/0.4ML IJ SOSY
40.0000 mg | PREFILLED_SYRINGE | INTRAMUSCULAR | Status: DC
  Administered 2024-02-29 – 2024-03-02 (×3): 40 mg via SUBCUTANEOUS
  Filled 2024-02-29 (×3): qty 0.4

## 2024-02-29 MED ORDER — ONDANSETRON HCL 4 MG PO TABS: 4.0000 mg | ORAL_TABLET | Freq: Four times a day (QID) | ORAL | Status: DC | PRN

## 2024-02-29 MED ORDER — VANCOMYCIN HCL IN DEXTROSE 1-5 GM/200ML-% IV SOLN
1000.0000 mg | Freq: Two times a day (BID) | INTRAVENOUS | Status: DC
  Administered 2024-03-01 – 2024-03-02 (×3): 1000 mg via INTRAVENOUS
  Filled 2024-02-29 (×3): qty 200

## 2024-02-29 MED ORDER — INSULIN ASPART 100 UNIT/ML IJ SOLN
0.0000 [IU] | Freq: Three times a day (TID) | INTRAMUSCULAR | Status: DC
Start: 2024-03-01 — End: 2024-03-03
  Administered 2024-03-01: 1 [IU] via SUBCUTANEOUS
  Administered 2024-03-01 – 2024-03-02 (×2): 2 [IU] via SUBCUTANEOUS
  Administered 2024-03-02: 1 [IU] via SUBCUTANEOUS
  Administered 2024-03-02: 3 [IU] via SUBCUTANEOUS
  Administered 2024-03-03: 2 [IU] via SUBCUTANEOUS

## 2024-02-29 MED ORDER — MIRTAZAPINE 15 MG PO TABS
15.0000 mg | ORAL_TABLET | Freq: Every day | ORAL | Status: DC
  Administered 2024-02-29 – 2024-03-02 (×3): 15 mg via ORAL
  Filled 2024-02-29 (×3): qty 1

## 2024-02-29 MED ORDER — POLYETHYLENE GLYCOL 3350 17 G PO PACK: 17.0000 g | PACK | Freq: Every day | ORAL | Status: DC | PRN

## 2024-02-29 MED ORDER — VENLAFAXINE HCL ER 75 MG PO CP24
150.0000 mg | ORAL_CAPSULE | Freq: Every day | ORAL | Status: DC
  Administered 2024-03-01 – 2024-03-03 (×3): 150 mg via ORAL
  Filled 2024-02-29 (×4): qty 2

## 2024-02-29 MED ORDER — INSULIN ASPART 100 UNIT/ML IJ SOLN
0.0000 [IU] | Freq: Every day | INTRAMUSCULAR | Status: DC
  Administered 2024-03-01: 3 [IU] via SUBCUTANEOUS

## 2024-02-29 NOTE — Assessment & Plan Note (Signed)
 Resume dexamethasone 

## 2024-02-29 NOTE — Assessment & Plan Note (Addendum)
 Generalised weaknes- in the setting of SIRS, poor oral intake over the past 3 days. - Resume mirtazapine 

## 2024-02-29 NOTE — Assessment & Plan Note (Signed)
 Diagnosed June 2022.  F/w Dr. Liam Redhead.  Currently on chemotherapy status post SRS to metastatic brain lesions- follows with Dr. Lorri Rota.  Per Oncology notes as of 5/19, patient had received 47 cycles.

## 2024-02-29 NOTE — Assessment & Plan Note (Signed)
Stable. -Resume losartan

## 2024-02-29 NOTE — ED Triage Notes (Signed)
 Pt with diarrhea and nausea since Tuesday, fever and chills.  Generalized weakness. Chemo on Monday.

## 2024-02-29 NOTE — Progress Notes (Signed)
eLINK following for sepsis protocol. ?

## 2024-02-29 NOTE — Assessment & Plan Note (Signed)
Resume Synthroid ?

## 2024-02-29 NOTE — Assessment & Plan Note (Signed)
 Controlled, A1c 6.  Diet controlled. - SSI- S

## 2024-02-29 NOTE — Progress Notes (Signed)
 Pharmacy Antibiotic Note  Bradley Goodman is a 68 y.o. male admitted on 02/29/2024 with sepsis. Patient presenting with generalized weakness, nausea, diarrhea, and fever. PMH significant for stage IV lung cancer with brain mets on chemotherapy, diabetes mellitus, adrenal insufficiency, depression. In ED, patient is afebrile with WBC 11.7. Pharmacy has been consulted for vancomycin  and cefepime dosing.  Plan: Start cefepime 2 g IV every 8 hours Give vancomycin  load of 1750 mg IV x1 Start vancomycin  1000 mg IV every 12 hours (eAUC 537, Scr 1.09, IBW used, Vd 0.72 L/kg) Monitor renal function, clinical status, culture data, and LOT  Height: 5\' 11"  (180.3 cm) Weight: 81.6 kg (180 lb) IBW/kg (Calculated) : 75.3  Temp (24hrs), Avg:99.4 F (37.4 C), Min:98.9 F (37.2 C), Max:100 F (37.8 C)  Recent Labs  Lab 02/24/24 1324 02/29/24 1156 02/29/24 1443  WBC 11.0* 11.7*  --   CREATININE 0.98 1.09  --   LATICACIDVEN  --  1.2 1.4    Estimated Creatinine Clearance: 71 mL/min (by C-G formula based on SCr of 1.09 mg/dL).    No Known Allergies  Antimicrobials this admission: Zosyn 5/24 x1 cefepime 5/24 >>  Vancomycin  5/24 >> Metronidazole 5/24 >>  Dose adjustments this admission: N/A  Microbiology results: 5/24 BCx: pending 5/24 UCx: pending  5/24 RVP: negative 5/24 MRSA PCR: pending  Thank you for involving pharmacy in this patient's care.   Ananias Balls, PharmD Clinical Pharmacist 02/29/2024 7:30 PM

## 2024-02-29 NOTE — Assessment & Plan Note (Addendum)
 Maintenance/bacteria with tachycardia heart rate 110-122, fever of 100, WBC of 11.7, tachypnea respiratory 15-28.,  Lactic acid 1.2.  Immunocompromised on chemotherapy.  Presenting with diarrhea.  As at this time focus of infection not identified.  Chest x-ray PA, UA-not suggestive of infectious etiology.  He does not use or have a port for chemotherapy.  - 2.5 L bolus given, continue N/s 75cc/hr x 15hrs - Follow-up blood cultures, urine cultures -Respiratory panel - negative - IV Zosyn started in ED continue with IV Vanco and cefepime -Stool C. Difficile - MRSA swab - GI pathogen panel

## 2024-02-29 NOTE — ED Provider Notes (Signed)
 [place] [facility]ERGENC[facility] DEPARTMENT AT [facility] Provider Note   CSN: [id number] Arrival date & time: 02/29/24  1122     History  Chief Complaint  Patient presents with   Diarrhea    Bradley Goodman is a 68 y.o. male.  Patient is a 68 year old male who presents to the emergency department with a chief complaint of generalized weakness, fever and chills, nausea, diarrhea, cough.  Symptoms have been ongoing for approximate the past 3 days.  Patient notes that he did get chemo on Monday.  He denies any chest pain, abdominal pain at this point.  Patient does have a history of small bowel obstruction but notes that this does not feel similar to previous.  He has had no abnormal headaches, pain to neck or back or abnormal rashes.  He denies any associated syncopal events.   Diarrhea Associated symptoms: fever        Home Medications Prior to Admission medications   Medication Sig Start Date End Date Taking? Authorizing Provider  acetaminophen  (TYLENOL ) 500 MG tablet Take 1,000 mg by mouth every 6 (six) hours as needed for mild pain (pain score 1-3) or fever.    [provider]  chlorproMAZINE  (THORAZINE ) 10 MG tablet Take 1 tablet (10 mg total) by mouth 3 (three) times daily as needed for hiccoughs. 11/27/23   [name], FNP  cholecalciferol  (VITAMIN D ) 1000 UNITS tablet Take 1,000 Units by mouth every evening.    [provider]  Continuous Glucose Sensor (FREESTYLE LIBRE 3 SENSOR) MISC Place 1 sensor on the skin every 14 days. Use to check glucose continuously 03/18/23   [name], FNP  dexamethasone  (DECADRON ) 1 MG tablet Take 1 tablet (1 mg total) by mouth daily with breakfast. 01/21/24   [name], Georgeann Kindred, FNP  docusate sodium  (COLACE) 100 MG capsule Take 100 mg by mouth daily.    [provider]  folic acid  (FOLVITE ) 1 MG tablet Take 1 tablet (1 mg total) by mouth daily. 12/13/23   [name], FNP  Krill Oil 300 MG CAPS Take 300 mg by mouth  every evening.    [provider]  levothyroxine  (SYNTHROID ) 88 MCG tablet Take 1 tablet (88 mcg total) by mouth daily before breakfast. 11/26/23   Nida, Gebreselassie W, MD  loratadine  (CLARITIN ) 10 MG tablet Take 10 mg by mouth every evening.     [provider]  losartan  (COZAAR ) 50 MG tablet Take 1.5 tablets (75 mg total) by mouth daily. 12/12/23   [name], FNP  mirtazapine  (REMERON ) 15 MG tablet Take 1 tablet (15 mg total) by mouth at bedtime. 01/14/24   [name], FNP  Multiple Vitamins-Minerals (MULTIVITAMIN GUMMIES ADULT PO) Take 2 each by mouth daily.    [provider]  ondansetron  (ZOFRAN ) 4 MG tablet Take 1 tablet (4 mg total) by mouth every 6 (six) hours as needed for nausea. 01/24/24   Mason Sole, Pratik D, DO  pantoprazole  (PROTONIX ) 40 MG tablet Take 1 tablet (40 mg total) by mouth every evening. 10/24/23   [name], FNP  polyethylene glycol powder (GLYCOLAX/MIRALAX) 17 GM/SCOOP powder Take 119 g by mouth daily as needed.    [provider]  PRESCRIPTION MEDICATION Testosterone  injection    [provider]  rosuvastatin  (CRESTOR ) 20 MG tablet Take 1 tablet (20 mg total) by mouth daily. 10/24/23   [name], Georgeann Kindred, FNP  SYRINGE-NEEDLE, DISP, 3 ML (B-D 3CC LUER-LOK SYR 21GX1-1/2) 21G X 1-1/2"  3 ML MISC Use to inject testosterone  every week 09/02/23   Nida, Gebreselassie W, MD  testosterone  cypionate (DEPOTESTOTERONE CYPIONATE) 100 MG/ML injection Take 50mg  ( 0.62ml) and 100mg  ( 1ml) into muscle alternatively weekly. 02/20/24   Nida, Gebreselassie W, MD  venlafaxine  XR (EFFEXOR -XR) 75 MG 24 hr capsule Take 2 capsules (150 mg total) by mouth daily with breakfast. 12/24/23   [name], Georgeann Kindred, FNP      Allergies    Patient has no known allergies.    Review of Systems   Review of Systems  Constitutional:  Positive for fatigue and fever.  Gastrointestinal:  Positive for diarrhea.  All other systems reviewed and are negative.   Physical  Exam Updated Vital Signs BP (!) 115/93 (BP Location: Left Arm)   Pulse (!) 122   Temp 100 F (37.8 C) (Oral)   Resp 20   Ht 5\' 11"  (1.803 m)   Wt 81.6 kg   SpO2 96%   BMI 25.10 kg/m  Physical Exam Vitals and nursing note reviewed.  Constitutional:      Appearance: Normal appearance.  HENT:     Head: Normocephalic and atraumatic.     Nose: Nose normal.     Mouth/Throat:     Mouth: Mucous membranes are moist.  Eyes:     Extraocular Movements: Extraocular movements intact.     Conjunctiva/sclera: Conjunctivae normal.     Pupils: Pupils are equal, round, and reactive to light.  Cardiovascular:     Rate and Rhythm: Normal rate and regular rhythm.     Pulses: Normal pulses.     Heart sounds: Normal heart sounds. No murmur heard.    No gallop.  Pulmonary:     Effort: Pulmonary effort is normal. No respiratory distress.     Breath sounds: Normal breath sounds. No stridor. No wheezing, rhonchi or rales.  Abdominal:     General: Abdomen is flat. Bowel sounds are normal. There is no distension.     Palpations: Abdomen is soft.     Tenderness: There is no abdominal tenderness. There is no guarding.  Musculoskeletal:        General: Normal range of motion.     Cervical back: Normal range of motion and neck supple. No rigidity or tenderness.     Right lower leg: No edema.     Left lower leg: No edema.  Skin:    General: Skin is warm and dry.     Findings: No bruising or rash.  Neurological:     General: No focal deficit present.     Mental Status: He is alert and oriented to person, place, and time. Mental status is at baseline.  Psychiatric:        Mood and Affect: Mood normal.        Behavior: Behavior normal.        Thought Content: Thought content normal.        Judgment: Judgment normal.     ED Results / Procedures / Treatments   Labs (all labs ordered are listed, but only abnormal results are displayed) Labs Reviewed  CBC WITH DIFFERENTIAL/PLATELET - Abnormal;  Notable for the following components:      Result Value   WBC 11.7 (*)    RBC 3.96 (*)    Hemoglobin 10.2 (*)    HCT 33.5 (*)    MCH 25.8 (*)    RDW 19.5 (*)    Neutro Abs 10.0 (*)    Abs Immature Granulocytes 0.08 (*)  All other components within normal limits  RESP PANEL BY RT-PCR (RSV, FLU A&B, COVID)  RVPGX2  CULTURE, BLOOD (ROUTINE X 2)  CULTURE, BLOOD (ROUTINE X 2)  PROTIME-INR  LACTIC ACID, PLASMA  LACTIC ACID, PLASMA  COMPREHENSIVE METABOLIC PANEL WITH GFR  URINALYSIS, W/ REFLEX TO CULTURE (INFECTION SUSPECTED)    EKG None  Radiology No results found.  Procedures .Critical Care  Performed by: Roselynn Connors, PA-C Authorized by: Roselynn Connors, PA-C   Critical care provider statement:    Critical care time (minutes):  35   Critical care was necessary to treat or prevent imminent or life-threatening deterioration of the following conditions:  Sepsis   Critical care was time spent personally by me on the following activities:  Development of treatment plan with patient or surrogate, discussions with consultants, evaluation of patient's response to treatment, examination of patient, ordering and review of laboratory studies, ordering and review of radiographic studies, ordering and performing treatments and interventions, pulse oximetry, re-evaluation of patient's condition and review of old charts   I assumed direction of critical care for this patient from another provider in my specialty: no     Care discussed with: admitting provider       Medications Ordered in ED Medications  lactated ringers  infusion ( Intravenous New Bag/Given 02/29/24 1228)  lactated ringers  bolus 1,000 mL (1,000 mLs Intravenous New Bag/Given 02/29/24 1221)    And  lactated ringers  bolus 1,000 mL (1,000 mLs Intravenous New Bag/Given 02/29/24 1219)    And  lactated ringers  bolus 500 mL (500 mLs Intravenous New Bag/Given 02/29/24 1227)  piperacillin-tazobactam (ZOSYN) IVPB 3.375  g (3.375 g Intravenous New Bag/Given 02/29/24 1224)    ED Course/ Medical Decision Making/ A&P                                 Medical Decision Making Amount and/or Complexity of Data Reviewed Labs: ordered. Radiology: ordered.  Risk Prescription drug management. Decision regarding hospitalization.   This patient presents to the ED for concern of fever, generalized malaise and fatigue, diarrhea, this involves an extensive number of treatment options, and is a complaint that carries with it a high risk of complications and morbidity.  The differential diagnosis includes sepsis, pneumonia, electrolyte derangement, acute kidney injury, urinary tract infection, acute viral syndrome, acute appendicitis, cholecystitis, obstruction, diverticulitis, pyelonephritis, kidney stone   Co morbidities that complicate the patient evaluation  Cancer on chemotherapy   Additional history obtained:  Additional history obtained from medical records External records from outside source obtained and reviewed including medical records   Lab Tests:  I Ordered, and personally interpreted labs.  The pertinent results include: Leukocytosis, anemia, normal platelets, hyponatremia, normal kidney function liver function, negative lactic acid, negative viral swab   Imaging Studies ordered:  I ordered imaging studies including chest x-ray I independently visualized and interpreted imaging which showed no acute cardiopulmonary process I agree with the radiologist interpretation   Consultations Obtained:  I requested consultation with the hospitalist,  and discussed lab and imaging findings as well as pertinent plan - they recommend: Admission   Problem List / ED Course / Critical interventions / Medication management  Patient does remain stable at this time.  Discussed with patient that we will plan for admission to the hospital service given his undifferentiated sepsis at this time.  Clear source is  not determined at this time.  Blood cultures have been drawn  and urinalysis is still pending.  Viral swab was negative.  Chest x-ray demonstrated no indication for pneumonia.  Abdominal exam was benign with no focal tenderness throughout I do not suspect an acute intra-abdominal surgical process.  Did discuss patient case with Dr. [name] with the hospitalist service who has accepted for admission at this time. I ordered medication including Zosyn , IV fluids for sepsis Reevaluation of the patient after these medicines showed that the patient improved I have reviewed the patients home medicines and have made adjustments as needed   Social Determinants of Health:  None   Test / Admission - Considered:  Admission        Final Clinical Impression(s) / ED Diagnoses Final diagnoses:  None    Rx / DC Orders ED Discharge Orders     None         Emmalene Hare 02/29/24 1707    Early Glisson, MD 03/01/24 [phone]

## 2024-02-29 NOTE — H&P (Signed)
 History and Physical    Bradley Goodman:[id number] DOB: [birth year] DOA: 02/29/2024  PCP: [name], FNP   Patient coming from: Home  I have personally briefly reviewed patient's old medical records in [facility]  Chief Complaint: Weakness, fever  HPI: Bradley Goodman is a 68 y.o. 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 Patient presented to the ED with complaints of generalized weakness, nausea diarrhea and fever.  Weakness started 4 days ago.  He has had diarrhea over the past 2-3 days reports 3-4 watery stools yesterday and 2 today.  No abdominal pain.  Reports poor oral intake and nausea without vomiting.  No urinary symptoms..  Reports some nasal congestion, no difficulty breathing, reports fever of 100.4 at home.  He does not have a port for chemo.  No headache.  ED Course: Temperature 100.  Heart rate 111-122.  Respirate rate 15-28.  Blood pressure systolic 111-125. WBC 11.7.  Lactic acid 1.2. Chest x-ray without acute abnormality. IV Zosyn started. UA, urine and blood cultures pending. 2.5 L bolus given.  Hospitalist to admit for SIRS.  Review of Systems: As per HPI all other systems reviewed and negative.  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   PONV (postoperative nausea and vomiting)    Type 2 diabetes mellitus (HCC)     Past Surgical History:  Procedure Laterality Date   Bilateral inguinal hernia repair     BRONCHIAL NEEDLE ASPIRATION BIOPSY  04/05/2021   Procedure: BRONCHIAL NEEDLE ASPIRATION BIOPSIES;  Surgeon: Prudy Brownie, DO;  Location: [facility];  Service: Pulmonary;;   COLONOSCOPY  01/23/2012   Procedure: COLONOSCOPY;   Surgeon: Suzette Espy, MD;  Location: [facility];  Service: Endoscopy;  Laterality: N/A;  9:30 AM   COLONOSCOPY N/A 10/11/2015   Procedure: COLONOSCOPY;  Surgeon: Suzette Espy, MD;  Location: [facility];  Service: Endoscopy;  Laterality: N/A;  830   COLONOSCOPY N/A 10/14/2019   Procedure: COLONOSCOPY;  Surgeon: Suzette Espy, MD;  Location: [facility];  Service: Endoscopy;  Laterality: N/A;  1:45   POLYPECTOMY  10/14/2019   Procedure: POLYPECTOMY;  Surgeon: Suzette Espy, MD;  Location: [facility];  Service: Endoscopy;;  ascending colon, descending colon   VIDEO BRONCHOSCOPY WITH ENDOBRONCHIAL ULTRASOUND N/A 04/05/2021   Procedure: VIDEO BRONCHOSCOPY WITH ENDOBRONCHIAL ULTRASOUND;  Surgeon: Prudy Brownie, DO;  Location: [facility] ENDOS[facility];  Service: Pulmonary;  Laterality: N/A;     reports that he has been smoking cigarettes. He started smoking about 50 years ago. He has a 25.2 pack-year smoking history. He has never used smokeless tobacco. He reports that he does not currently use alcohol. He reports that he does not use drugs.  No Known Allergies  Family History  Problem Relation Age of Onset   Hypertension Mother    Diabetes Mother    Heart attack Mother    Hypertension Father    Heart attack Father    Heart attack Brother    Colon cancer Neg Hx     Prior to Admission medications   Medication Sig Start Date End Date Taking? Authorizing Provider  acetaminophen  (  TYLENOL ) 500 MG tablet Take 1,000 mg by mouth every 6 (six) hours as needed for mild pain (pain score 1-3) or fever.   Yes [provider]  chlorproMAZINE  (THORAZINE ) 10 MG tablet Take 1 tablet (10 mg total) by mouth 3 (three) times daily as needed for hiccoughs. 11/27/23  Yes [name], Georgeann Kindred, FNP  cholecalciferol  (VITAMIN D ) 1000 UNITS tablet Take 1,000 Units by mouth every evening.   Yes [provider]  dexamethasone  (DECADRON ) 1 MG tablet Take 1 tablet (1 mg total) by mouth daily with breakfast.  01/21/24  Yes [name], Georgeann Kindred, FNP  folic acid  (FOLVITE ) 1 MG tablet Take 1 tablet (1 mg total) by mouth daily. Patient taking differently: Take 1 mg by mouth at bedtime. 12/13/23  Yes [name], Georgeann Kindred, FNP  Krill Oil 300 MG CAPS Take 300 mg by mouth every evening.   Yes [provider]  levothyroxine  (SYNTHROID ) 88 MCG tablet Take 1 tablet (88 mcg total) by mouth daily before breakfast. 11/26/23  Yes Nida, Gebreselassie W, MD  loratadine  (CLARITIN ) 10 MG tablet Take 10 mg by mouth every evening.    Yes [provider]  losartan  (COZAAR ) 50 MG tablet Take 1.5 tablets (75 mg total) by mouth daily. Patient taking differently: Take 75 mg by mouth at bedtime. 12/12/23  Yes [name], Georgeann Kindred, FNP  mirtazapine  (REMERON ) 15 MG tablet Take 1 tablet (15 mg total) by mouth at bedtime. 01/14/24  Yes [name], Georgeann Kindred, FNP  Multiple Vitamins-Minerals (MULTIVITAMIN GUMMIES ADULT PO) Take 2 tablets by mouth every evening.   Yes [provider]  ondansetron  (ZOFRAN ) 4 MG tablet Take 1 tablet (4 mg total) by mouth every 6 (six) hours as needed for nausea. 01/24/24  Yes Shah, Pratik D, DO  pantoprazole  (PROTONIX ) 40 MG tablet Take 1 tablet (40 mg total) by mouth every evening. 10/24/23  Yes [name], Georgeann Kindred, FNP  polyethylene glycol powder (GLYCOLAX/MIRALAX) 17 GM/SCOOP powder Take 119 g by mouth daily as needed for mild constipation or moderate constipation.   Yes [provider]  rosuvastatin  (CRESTOR ) 20 MG tablet Take 1 tablet (20 mg total) by mouth daily. Patient taking differently: Take 20 mg by mouth at bedtime. 10/24/23  Yes [name], Georgeann Kindred, FNP  SYRINGE-NEEDLE, DISP, 3 ML (B-D 3CC LUER-LOK SYR 21GX1-1/2) 21G X 1-1/2" 3 ML MISC Use to inject testosterone  every week 09/02/23  Yes Nida, Gebreselassie W, MD  testosterone  cypionate (DEPOTESTOTERONE CYPIONATE) 100 MG/ML injection Take 50mg  ( 0.63ml) and 100mg  ( 1ml) into muscle alternatively weekly. Patient taking differently: Inject 50-100 mg into  the muscle See admin instructions. Take 50mg  ( 0.27ml) and 100mg  ( 1ml) into muscle alternatively weekly. 02/20/24  Yes Nida, Jaynee Meyer, MD  venlafaxine  XR (EFFEXOR -XR) 75 MG 24 hr capsule Take 2 capsules (150 mg total) by mouth daily with breakfast. Patient taking differently: Take 150 mg by mouth at bedtime. 12/24/23  Yes [name], FNP    Physical Exam: Vitals:   02/29/24 1430 02/29/24 1439 02/29/24 1530 02/29/24 1600  BP: 111/75  112/71 113/72  Pulse: (!) 111 (!) 113 (!) 113 (!) 114  Resp: (!) 28 (!) 21 (!) 21 (!) 21  Temp:      TempSrc:      SpO2: 97% 94% 94% 93%  Weight:      Height:        Constitutional: NAD, calm, comfortable Vitals:   02/29/24 1430 02/29/24 1439 02/29/24 1530 02/29/24 1600  BP: 111/75  112/71 113/72  Pulse: (!) 111 (!) 113 (!) 113 (!) 114  Resp: (!) 28 (!) 21 (!) 21 (!) 21  Temp:      TempSrc:      SpO2: 97% 94% 94% 93%  Weight:      Height:       Eyes: Slight discharge from left eye, patient reports this is chronic and unchanged, pupils equal, lids and conjunctivae normal ENMT: Mucous membranes are moist, some whitish coating on tongue, unsure if this is thrush Neck: normal, supple, no masses, no thyromegaly Respiratory: clear to auscultation bilaterally, no wheezing, no crackles. Normal respiratory effort. No accessory muscle use.  Cardiovascular: Regular rate and rhythm, no murmurs / rubs / gallops. No extremity edema.   Abdomen: no tenderness, no masses palpated. No hepatosplenomegaly.   Musculoskeletal: no clubbing / cyanosis. No joint deformity upper and lower extremities.  Skin: no rashes, lesions, ulcers. No induration Neurologic: No facial asymmetry, 5/5 strength in all extremities, good and equal grip strength bilaterally, speech fluent.  Psychiatric: Normal judgment and insight. Alert and oriented x 3. Normal mood.   Labs on Admission: I have personally reviewed following labs and imaging studies  CBC: Recent Labs  Lab  02/24/24 1324 02/29/24 1156  WBC 11.0* 11.7*  NEUTROABS 9.3* 10.0*  HGB 11.5* 10.2*  HCT 37.3* 33.5*  MCV 83.4 84.6  PLT 397 329   Basic Metabolic Panel: Recent Labs  Lab 02/24/24 1324 02/29/24 1156  NA 142 132*  K 4.0 3.9  CL 105 102  CO2 29 22  GLUCOSE 96 123*  BUN 14 12  CREATININE 0.98 1.09  CALCIUM  9.3 8.4*   GFR: Estimated Creatinine Clearance: 71 mL/min (by C-G formula based on SCr of 1.09 mg/dL). Liver Function Tests: Recent Labs  Lab 02/24/24 1324 02/29/24 1156  AST 21 37  ALT 23 36  ALKPHOS 73 86  BILITOT 0.2 1.3*  PROT 6.8 6.8  ALBUMIN 3.8 3.0*   Coagulation Profile: Recent Labs  Lab 02/29/24 1156  INR 1.2   Urine analysis:    Component Value Date/Time   COLORURINE YELLOW 01/22/2024 1758   APPEARANCEUR CLEAR 01/22/2024 1758   APPEARANCEUR Clear 12/11/2017 1117   LABSPEC 1.020 01/22/2024 1758   PHURINE 5.0 01/22/2024 1758   GLUCOSEU NEGATIVE 01/22/2024 1758   HGBUR NEGATIVE 01/22/2024 1758   BILIRUBINUR NEGATIVE 01/22/2024 1758   BILIRUBINUR Negative 12/11/2017 1117   KETONESUR 20 (A) 01/22/2024 1758   PROTEINUR 30 (A) 01/22/2024 1758   UROBILINOGEN negative 07/13/2014 1059   NITRITE NEGATIVE 01/22/2024 1758   LEUKOCYTESUR NEGATIVE 01/22/2024 1758    Radiological Exams on Admission: DG Chest Port 1 View Result Date: 02/29/2024 CLINICAL DATA:  Questionable sepsis - evaluate for abnormality EXAM: PORTABLE CHEST 1 VIEW COMPARISON:  January 27, 2024., January 22, 2024 FINDINGS: The cardiomediastinal silhouette is unchanged in contour. No pleural effusion. No pneumothorax. No acute pleuroparenchymal abnormality. IMPRESSION: No acute cardiopulmonary abnormality. Electronically Signed   By: Clancy Crimes M.D.   On: 02/29/2024 12:59   EKG: Pending.   Assessment/Plan Principal Problem:   SIRS (systemic inflammatory response syndrome) (HCC) Active Problems:   Generalized weakness   Essential hypertension   Depression, recurrent (HCC)    Adenocarcinoma of right lung, stage 4 (HCC)   Adrenal insufficiency (HCC)   Acquired hypothyroidism   Recurrent major depression in remission (HCC)   Type 2 diabetes mellitus (HCC)   Assessment and Plan: * SIRS (systemic inflammatory response syndrome) (HCC) Maintenance/bacteria with tachycardia heart rate 110-122, fever of 100,  WBC of 11.7, tachypnea respiratory 15-28.,  Lactic acid 1.2.  Immunocompromised on chemotherapy.  Presenting with diarrhea.  As at this time focus of infection not identified.  Chest x-ray PA, UA-not suggestive of infectious etiology.  He does not use or have a port for chemotherapy.  - 2.5 L bolus given, continue N/s 75cc/hr x 15hrs - Follow-up blood cultures, urine cultures -Respiratory panel - negative - IV Zosyn started in ED continue with IV Vanco and cefepime -Stool C. Difficile - MRSA swab - GI pathogen panel  Generalized weakness Generalised weaknes- in the setting of SIRS, poor oral intake over the past 3 days. - Resume mirtazapine   Type 2 diabetes mellitus (HCC) Controlled, A1c 6.  Diet controlled. - SSI- S  Acquired hypothyroidism Resume Synthroid   Adrenal insufficiency (HCC) Resume dexamethasone   Adenocarcinoma of right lung, stage 4 [facility]) Diagnosed June 2022.  F/w Dr. Liam Redhead.  Currently on chemotherapy status post SRS to metastatic brain lesions- follows with Dr. Lorri Rota.  Per Oncology notes as of 5/19, patient had received 47 cycles.    Essential hypertension Stable. -Resume losartan    DVT prophylaxis: Lovenox  Code Status: FULL code- Confirmed with patient and spouse at bedside. Family Communication: Spouse at bedside Disposition Plan: ~ 2 days Consults called: None Admission status: Inpt tele I certify that at the point of admission it is my clinical judgment that the patient will require inpatient hospital care spanning beyond 2 midnights from the point of admission due to high intensity of service, high risk for further  deterioration and high frequency of surveillance required.   Author: Pati Bonine, MD 02/29/2024 5:51 PM  For on call review [email or web address].

## 2024-03-01 DIAGNOSIS — I10 Essential (primary) hypertension: Secondary | ICD-10-CM | POA: Diagnosis not present

## 2024-03-01 DIAGNOSIS — F3340 Major depressive disorder, recurrent, in remission, unspecified: Secondary | ICD-10-CM | POA: Diagnosis not present

## 2024-03-01 DIAGNOSIS — R6510 Systemic inflammatory response syndrome (SIRS) of non-infectious origin without acute organ dysfunction: Secondary | ICD-10-CM | POA: Diagnosis not present

## 2024-03-01 DIAGNOSIS — R531 Weakness: Secondary | ICD-10-CM | POA: Diagnosis not present

## 2024-03-01 LAB — BASIC METABOLIC PANEL WITH GFR
Anion gap: 4 — ABNORMAL LOW (ref 5–15)
BUN: 9 mg/dL (ref 8–23)
CO2: 22 mmol/L (ref 22–32)
Calcium: 7.6 mg/dL — ABNORMAL LOW (ref 8.9–10.3)
Chloride: 107 mmol/L (ref 98–111)
Creatinine, Ser: 1.06 mg/dL (ref 0.61–1.24)
GFR, Estimated: >60 mL/min (ref >60)
Glucose, Bld: 98 mg/dL (ref 70–99)
Potassium: 3.4 mmol/L — ABNORMAL LOW (ref 3.5–5.1)
Sodium: 133 mmol/L — ABNORMAL LOW (ref 135–145)

## 2024-03-01 LAB — CBC
HCT: 25 % — ABNORMAL LOW (ref 39.0–52.0)
Hemoglobin: 7.7 g/dL — ABNORMAL LOW (ref 13.0–17.0)
MCH: 26.5 pg (ref 26.0–34.0)
MCHC: 30.8 g/dL (ref 30.0–36.0)
MCV: 85.9 fL (ref 80.0–100.0)
Platelets: 236 10*3/uL (ref 150–400)
RBC: 2.91 MIL/uL — ABNORMAL LOW (ref 4.22–5.81)
RDW: 19.4 % — ABNORMAL HIGH (ref 11.5–15.5)
WBC: 6.8 10*3/uL (ref 4.0–10.5)
nRBC: 0 % (ref 0.0–0.2)

## 2024-03-01 LAB — URINE CULTURE: Culture: NO GROWTH

## 2024-03-01 MED ORDER — DEXTROSE-SODIUM CHLORIDE 5-0.9 % IV SOLN: INTRAVENOUS | Status: DC

## 2024-03-01 MED ORDER — POTASSIUM CHLORIDE CRYS ER 20 MEQ PO TBCR
40.0000 meq | EXTENDED_RELEASE_TABLET | Freq: Once | ORAL | Status: AC
  Administered 2024-03-01: 40 meq via ORAL
  Filled 2024-03-01: qty 2

## 2024-03-01 MED ORDER — HYDROCORTISONE SOD SUC (PF) 100 MG IJ SOLR
100.0000 mg | Freq: Three times a day (TID) | INTRAMUSCULAR | Status: DC
  Administered 2024-03-01 – 2024-03-03 (×7): 100 mg via INTRAVENOUS
  Filled 2024-03-01 (×7): qty 2

## 2024-03-01 MED ORDER — SODIUM CHLORIDE 0.9 % IV SOLN
12.5000 mg | Freq: Once | INTRAVENOUS | Status: AC
  Administered 2024-03-01: 12.5 mg via INTRAVENOUS
  Filled 2024-03-01: qty 0.5

## 2024-03-01 NOTE — Plan of Care (Signed)
  Problem: Education: Goal: Knowledge of General Education information will improve Description: Including pain rating scale, medication(s)/side effects and non-pharmacologic comfort measures Outcome: Progressing   Problem: Health Behavior/Discharge Planning: Goal: Ability to manage health-related needs will improve Outcome: Progressing   Problem: Clinical Measurements: Goal: Ability to maintain clinical measurements within normal limits will improve Outcome: Progressing Goal: Will remain free from infection Outcome: Progressing Goal: Diagnostic test results will improve Outcome: Progressing Goal: Respiratory complications will improve Outcome: Progressing Goal: Cardiovascular complication will be avoided Outcome: Progressing   Problem: Activity: Goal: Risk for activity intolerance will decrease Outcome: Progressing   Problem: Nutrition: Goal: Adequate nutrition will be maintained Outcome: Progressing   Problem: Pain Managment: Goal: General experience of comfort will improve and/or be controlled Outcome: Progressing   Problem: Skin Integrity: Goal: Risk for impaired skin integrity will decrease Outcome: Progressing

## 2024-03-01 NOTE — Progress Notes (Signed)
 Progress Note   Patient: Bradley Goodman WJX:[id number] DOB: [birth year] DOA: 02/29/2024     1 DOS: the patient was seen and examined on 03/01/2024   Brief hospital admission narrative: As per H&P written by Dr. [name] on 02/29/2024 Bradley Goodman is a 68 y.o. 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 Patient presented to the ED with complaints of generalized weakness, nausea diarrhea and fever.  Weakness started 4 days ago.  He has had diarrhea over the past 2-3 days reports 3-4 watery stools yesterday and 2 today.  No abdominal pain.  Reports poor oral intake and nausea without vomiting.  No urinary symptoms..  Reports some nasal congestion, no difficulty breathing, reports fever of 100.4 at home.  He does not have a port for chemo.  No headache.   ED Course: Temperature 100.  Heart rate 111-122.  Respirate rate 15-28.  Blood pressure systolic 111-125. WBC 11.7.  Lactic acid 1.2. Chest x-ray without acute abnormality. IV Zosyn started. UA, urine and blood cultures pending. 2.5 L bolus given.  Hospitalist to admit for SIRS.  Assessment and plan 1-SIRS -Most likely driven in the setting of GI losses, decreased oral intake and dehydration - Patient at high risk for infection given history of chemotherapy and chronic steroid usage - Continue empiric antibiotics and follow culture results -Continue to maintain adequate hydration/fluid resuscitation - Patient instructed to do his best to increase oral intake. - Patient's Decadron  has been discontinued and patient started on Solu-Cortef. - Follow clinical response and continue supportive care.  2-acquired hypothyroidism - Continue Synthroid   3-history of adrenal insufficiency - Patient chronically on Decadron  - With concerns for adrenal crisis - Decadron  has been placed on hold and patient started on Solu-Cortef. - Follow clinical  response.  4-essential hypertension - Currently soft blood pressure in the setting of SIRS with concerns for early sepsis development - Holding oral antihypertensive agents - Maintain adequate hydration - Follow vital signs.  5-generalized weakness/depression and poor oral intake - Maintain adequate hydration - Continue supportive care - Continue the use of Remeron  and Effexor   6-type 2 diabetes - Currently diet controlled - Recent A1c 6.0 - Continue sliding scale insulin  and follow CBG fluctuation.  7-GERD - Continue PPI.  8-Adenocarcinoma of right lung, stage 4 [facility]) Diagnosed June 2022.  F/w Dr. Liam Redhead.  Currently on chemotherapy status post SRS to metastatic brain lesions- follows with Dr. Lorri Rota.  Per Oncology notes as of 5/19, patient had received 47 cycles.   9-anemia of chronic disease - No overt bleeding appreciated - Decrease in patient's hemoglobin presumed to be associated with hemodilution from fluid resuscitation. - Continue to follow hemoglobin trend.  10-hypokalemia - In the setting of GI losses and decreased oral intake - Continue repletion and follow electrolytes trend.   Subjective:  Currently afebrile; no chest pain, no nausea or vomiting.  Still feeling weak and having some intermittent palpitation.  Blood pressure is soft.  Physical Exam: Vitals:   03/01/24 1009 03/01/24 1220 03/01/24 1417 03/01/24 1615  BP: 95/64 111/64 110/71 120/86  Pulse: (!) 124 (!) 122 (!) 112 (!) 107  Resp:      Temp: 99.5 F (37.5 C) 99.4 F (37.4 C) 98.2 F (36.8 C) (!) 97.5 F (36.4 C)  TempSrc: Oral Oral Oral Oral  SpO2: 91% 97% 95% 97%  Weight:      Height:       General exam:  Alert, awake, oriented x 3; in no major distress. Respiratory system: Clear to auscultation. Respiratory effort normal.  Good saturation on room air.  No using accessory muscles. Cardiovascular system: Sinus tachycardia; no rubs or gallops. Gastrointestinal system: Abdomen is  nondistended, soft and nontender. No organomegaly or masses felt. Normal bowel sounds heard. Central nervous system: Generally weak.  No focal neurological deficits. Extremities: No cyanosis or clubbing; trace edema appreciated bilaterally. Skin: No petechiae. Psychiatry: Judgement and insight appear normal.  Flat affect.   Data Reviewed: Basic metabolic panel: Sodium 133, potassium 3.4, chloride 107, bicarb 22, BUN 9, creatinine 1.06 and GFR >60 WUJ:WJXB 6.8, hemoglobin 7.7 and platelet count 236K  Family Communication: No family at bedside.  Disposition: Status is: Inpatient Remains inpatient appropriate because: IV therapy.  Time spent: 50 minutes  Author: Justina Oman, MD 03/01/2024 5:37 PM  For on call review [email or web address].

## 2024-03-01 NOTE — TOC Initial Note (Signed)
 Transition of Care [facility]) - Initial/Assessment Note    Patient Details  Name: Bradley Goodman MRN: [id number] Date of Birth: Jan 28, 1956  Transition of Care [facility]) CM/SW Contact:    Lynda Sands, RN Phone Number: 03/01/2024, 1:16 PM  Clinical Narrative:   CM met with patient  at bedside. Patient reports He live with his spouse in a single family home 0 steps to enter home.  Patient is independent with adl's. Patient's  without assistance. No DME.              Expected Discharge Plan: Home w Home Health Services Barriers to Discharge: Continued Medical Work up   Patient Goals and CMS Choice       [place] ownership interest in [facility]provided to:: Patient    Expected Discharge Plan and Services     Post Acute Care Choice: Home Health                                        Prior Living Arrangements/Services   Lives with:: Spouse Patient language and need for interpreter reviewed:: No        Need for Family Participation in Patient Care: Yes (Comment) Care giver support system in place?: Yes (comment)   Criminal Activity/Legal Involvement Pertinent to Current Situation/Hospitalization: No - Comment as needed  Activities of Daily Living   ADL Screening (condition at time of admission) Independently performs ADLs?: Yes (appropriate for developmental age) Is the patient deaf or have difficulty hearing?: No Does the patient have difficulty seeing, even when wearing glasses/contacts?: No Does the patient have difficulty concentrating, remembering, or making decisions?: No  Permission Sought/Granted      Share Information with NAME: Anette Mathe     Permission granted to share info w Relationship: spouse  Permission granted to share info w Contact Information: [phone]  Emotional Assessment Appearance:: Appears stated age Attitude/Demeanor/Rapport: Engaged Affect (typically observed): Accepting Orientation: : Oriented to Self, Oriented  to Place, Oriented to  Time, Oriented to Situation Alcohol / Substance Use: Not Applicable Psych Involvement: No (comment)  Admission diagnosis:  Hyponatremia [E87.1] SIRS (systemic inflammatory response syndrome) (HCC) [R65.10] Anemia, unspecified type [D64.9] Sepsis, due to unspecified organism, unspecified whether acute organ dysfunction present [facility]) [A41.9] Patient Active Problem List   Diagnosis Date Noted   SIRS (systemic inflammatory response syndrome) (HCC) 02/29/2024   Generalized weakness 01/22/2024   Failure to thrive  in adult 01/22/2024   Hypoalbuminemia due to protein-calorie malnutrition (HCC) 01/22/2024   Dehydration 01/22/2024   Type 2 diabetes mellitus (HCC) 01/14/2024   Leukocytosis 11/18/2023   Thrombocytosis 11/18/2023   Current smoker 06/14/2022   Recurrent major depression in remission (HCC) 04/26/2022   Type 2 diabetes mellitus with hyperglycemia, without long-term current use of insulin  (HCC) 01/10/2022   Adrenal insufficiency (HCC) 06/27/2021   Hypogonadism, male 06/27/2021   Acquired hypothyroidism 06/27/2021   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   Adenocarcinoma of right lung, stage 4 (HCC) 04/25/2021   Encounter for antineoplastic chemotherapy 04/25/2021   Encounter for antineoplastic immunotherapy 04/25/2021   Malignant neoplasm metastatic to brain (HCC) 04/05/2021   Adenopathy 04/03/2021   Tobacco use 07/14/2020   Mixed hyperlipidemia 11/09/2019   Vitamin D  deficiency 07/08/2019   Depression, recurrent (HCC) 07/08/2019   History of colonic polyps    History of adenomatous polyp of colon 09/14/2015   GERD (gastroesophageal reflux disease) 06/24/2013   Essential hypertension  06/24/2013   PCP:  [name], FNP Pharmacy:   [facility] [address] [place] [address] Phone: [phone] Fax: [phone]  [facility] [address] [place] [facility] [address] [place] [address] [place]  [address] Phone: [phone] Fax: [phone]     Social Drivers of Health (SDOH) Social History: SDOH Screenings   Food Insecurity: No Food Insecurity (02/29/2024)  Housing: Low Risk  (02/29/2024)  Transportation Needs: No Transportation Needs (02/29/2024)  Utilities: Not At Risk (02/29/2024)  Alcohol Screen: Low Risk  (04/15/2023)  Depression (PHQ2-9): Low Risk  (01/14/2024)  Financial Resource Strain: Patient Declined (10/14/2023)  Physical Activity: Unknown (10/14/2023)  Social Connections: Moderately Isolated (02/29/2024)  Stress: No Stress Concern Present (10/14/2023)  Tobacco Use: High Risk (02/29/2024)   SDOH Interventions:     Readmission Risk Interventions    01/23/2024   10:04 AM 01/22/2024    9:39 PM  Readmission Risk Prevention Plan  Transportation Screening  Complete  PCP or Specialist Appt within 5-7 Days  Complete  Home Care Screening  Complete  Medication Review (RN CM)  Complete  HRI or Home Care Consult Complete   Social Work Consult for Recovery Care Planning/Counseling Complete   Palliative Care Screening Not Applicable   Medication Review [employer]) Complete

## 2024-03-01 NOTE — Progress Notes (Signed)
   03/01/24 1009  Assess: MEWS Score  Temp 99.5 F (37.5 C)  BP 95/64  MAP (mmHg) 73  Pulse Rate (!) 124  SpO2 91 %  O2 Device Room Air  Assess: MEWS Score  MEWS Temp 0  MEWS Systolic 1  MEWS Pulse 2  MEWS RR 0  MEWS LOC 0  MEWS Score 3  MEWS Score Color Yellow  Assess: if the MEWS score is Yellow or Red  Were vital signs accurate and taken at a resting state? Yes  Does the patient meet 2 or more of the SIRS criteria? No  MEWS guidelines implemented  Yes, yellow  Treat  MEWS Interventions Considered administering scheduled or prn medications/treatments as ordered  Take Vital Signs  Increase Vital Sign Frequency  Yellow: Q2hr x1, continue Q4hrs until patient remains green for 12hrs  Escalate  MEWS: Escalate Yellow: Discuss with charge nurse and consider notifying provider and/or RRT  Notify: Charge Nurse/RN  Name of Charge Nurse/RN Notified Sherline Distel, RN  Provider Notification  Provider Name/Title Michaelene Admire, MD  Date Provider Notified 03/01/24  Time Provider Notified 1019  Method of Notification Page  Notification Reason New onset of dysrhythmia  Provider response Other (Comment)  Date of Provider Response 03/01/24  Time of Provider Response 1019  Assess: SIRS CRITERIA  SIRS Temperature  0  SIRS Respirations  0  SIRS Pulse 1  SIRS WBC 0  SIRS Score Sum  1

## 2024-03-01 NOTE — Plan of Care (Signed)

## 2024-03-01 NOTE — Progress Notes (Signed)
   03/01/24 1309  TOC Assessment  TOC screening is complete Yes  DTP Eligible Y  Once discharged, how will the patient get to their discharge location? Family/Friend - [place]  Expected Discharge Plan Home w Home Health Services  Barriers to Discharge Continued Medical Work up  [employer] ownership interest in [facility].provided to: Patient  Post Acute Care Choice Home Health  Lives with: Spouse  Share Information with NAME Anette Wildasin  Permission granted to share info w Relationship spouse  Permission granted to share info w Contact Information [phone]  Patient language and need for interpreter reviewed: No  Criminal Activity/Legal Involvement Pertinent to Current Situation/Hospitalization No - Comment as needed  Need for Family Participation in Patient Care Y  Care giver support system in place? Y  Appearance: Appears stated age  Attitude/Demeanor/Rapport Engaged  Affect (typically observed) Accepting  Orientation:  Oriented to Self;Oriented to Place;Oriented to  Time;Oriented to Situation  Alcohol / Substance Use Not Applicable  Psych Involvement N   Admitted  systemic inflammatory response syndrome. TOC will continue to monitor patient advancement through interdisciplinary progressions rounds.

## 2024-03-02 DIAGNOSIS — R531 Weakness: Secondary | ICD-10-CM | POA: Diagnosis not present

## 2024-03-02 DIAGNOSIS — R6510 Systemic inflammatory response syndrome (SIRS) of non-infectious origin without acute organ dysfunction: Secondary | ICD-10-CM | POA: Diagnosis not present

## 2024-03-02 DIAGNOSIS — F3340 Major depressive disorder, recurrent, in remission, unspecified: Secondary | ICD-10-CM | POA: Diagnosis not present

## 2024-03-02 DIAGNOSIS — I10 Essential (primary) hypertension: Secondary | ICD-10-CM | POA: Diagnosis not present

## 2024-03-02 LAB — CBC
HCT: 27.6 % — ABNORMAL LOW (ref 39.0–52.0)
Hemoglobin: 8.2 g/dL — ABNORMAL LOW (ref 13.0–17.0)
MCH: 24.9 pg — ABNORMAL LOW (ref 26.0–34.0)
MCHC: 29.7 g/dL — ABNORMAL LOW (ref 30.0–36.0)
MCV: 83.9 fL (ref 80.0–100.0)
Platelets: 234 10*3/uL (ref 150–400)
RBC: 3.29 MIL/uL — ABNORMAL LOW (ref 4.22–5.81)
RDW: 18.8 % — ABNORMAL HIGH (ref 11.5–15.5)
WBC: 8.7 10*3/uL (ref 4.0–10.5)
nRBC: 0 % (ref 0.0–0.2)

## 2024-03-02 LAB — GLUCOSE, CAPILLARY: Glucose-Capillary: 182 mg/dL — ABNORMAL HIGH (ref 70–99)

## 2024-03-02 LAB — BASIC METABOLIC PANEL WITH GFR
Anion gap: 10 (ref 5–15)
BUN: 10 mg/dL (ref 8–23)
CO2: 21 mmol/L — ABNORMAL LOW (ref 22–32)
Calcium: 8.1 mg/dL — ABNORMAL LOW (ref 8.9–10.3)
Chloride: 104 mmol/L (ref 98–111)
Creatinine, Ser: 0.94 mg/dL (ref 0.61–1.24)
GFR, Estimated: >60 mL/min (ref >60)
Glucose, Bld: 173 mg/dL — ABNORMAL HIGH (ref 70–99)
Potassium: 3.4 mmol/L — ABNORMAL LOW (ref 3.5–5.1)
Sodium: 135 mmol/L (ref 135–145)

## 2024-03-02 LAB — MRSA NEXT GEN BY PCR, NASAL: MRSA by PCR Next Gen: NOT DETECTED

## 2024-03-02 LAB — MAGNESIUM: Magnesium: 2 mg/dL (ref 1.7–2.4)

## 2024-03-02 MED ORDER — POTASSIUM CHLORIDE CRYS ER 20 MEQ PO TBCR
40.0000 meq | EXTENDED_RELEASE_TABLET | Freq: Once | ORAL | Status: AC
  Administered 2024-03-02: 40 meq via ORAL
  Filled 2024-03-02: qty 2

## 2024-03-02 NOTE — Progress Notes (Signed)
 Nurse at bedside,patient alert and oriented times four. No c/o pain or discomfort noted this am. Plan of care on going.

## 2024-03-02 NOTE — Progress Notes (Signed)
 Progress Note   Patient: Bradley Goodman ZOX:[id number] DOB: Jun 21, 1956 DOA: 02/29/2024     2 DOS: the patient was seen and examined on 03/02/2024   Brief hospital admission narrative: As per H&P written by Dr. [name] on 02/29/2024 Bradley Goodman is a 68 y.o. 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 Patient presented to the ED with complaints of generalized weakness, nausea diarrhea and fever.  Weakness started 4 days ago.  He has had diarrhea over the past 2-3 days reports 3-4 watery stools yesterday and 2 today.  No abdominal pain.  Reports poor oral intake and nausea without vomiting.  No urinary symptoms..  Reports some nasal congestion, no difficulty breathing, reports fever of 100.4 at home.  He does not have a port for chemo.  No headache.   ED Course: Temperature 100.  Heart rate 111-122.  Respirate rate 15-28.  Blood pressure systolic 111-125. WBC 11.7.  Lactic acid 1.2. Chest x-ray without acute abnormality. IV Zosyn started. UA, urine and blood cultures pending. 2.5 L bolus given.  Hospitalist to admit for SIRS.  Assessment and plan 1-SIRS -Most likely driven in the setting of GI losses, decreased oral intake and dehydration - Patient at high risk for infection given history of chemotherapy and chronic steroid usage - Continue empiric antibiotics and follow culture results; vancomycin  will be discontinued as patient MRSA PCR negative. -Continue to maintain adequate hydration; orally now. - Patient instructed to do his best to increase oral intake. - Great response to the use of Solu-Cortef; continue to follow clinical response; prior to transition back to Decadron . - Follow clinical response and continue supportive care.  2-acquired hypothyroidism - Continue Synthroid .  3-history of adrenal insufficiency - Patient chronically on Decadron  - With concerns for adrenal  crisis - Decadron  has been placed on hold and patient started on Solu-Cortef. - Continue to follow clinical response.  4-essential hypertension - Currently soft blood pressure in the setting of SIRS with concerns for early sepsis development - Holding oral antihypertensive agents - Maintain adequate hydration - Follow vital signs.  5-generalized weakness/depression and poor oral intake - Maintain adequate hydration - Continue supportive care - Continue the use of Remeron  and Effexor   6-type 2 diabetes - Currently diet controlled - Recent A1c 6.0 - Continue sliding scale insulin  and follow CBG fluctuation.  7-GERD - Continue PPI.  8-Adenocarcinoma of right lung, stage 4 [facility]) Diagnosed June 2022.  F/w Dr. Liam Redhead.  Currently on chemotherapy status post SRS to metastatic brain lesions- follows with Dr. Lorri Rota.  Per Oncology notes as of 5/19, patient had received 47 cycles.   9-anemia of chronic disease - No overt bleeding appreciated - Decrease in patient's hemoglobin presumed to be associated with hemodilution from fluid resuscitation. - Continue to follow hemoglobin trend. - Hemoglobin 8.2 currently; might have a component of chronic anemia associated with malignancy.  10-hypokalemia - In the setting of GI losses and decreased oral intake - Continue to follow electrolytes trend and further replete as needed.   Subjective:  Limited, afebrile; no chest pain, no nausea, no vomiting.  Reports feeling slightly puffy after fluid resuscitation and has not had any further diarrhea or abdominal pain.  Physical Exam: Vitals:   03/01/24 2000 03/02/24 0538 03/02/24 0852 03/02/24 1333  BP: 123/78 (!) 157/92 (!) 136/92 (!) 142/93  Pulse: (!) 106 96 98 98  Resp: 20     Temp: 98.6 F (37  C) 97.6 F (36.4 C) 98 F (36.7 C) (!) 97.5 F (36.4 C)  TempSrc: Oral Oral Oral Oral  SpO2: 96% 100% 96% 99%  Weight:      Height:       General exam: Alert, awake, oriented x 3; no  overnight events. Respiratory system: Good saturation on room air; no using accessory muscles. Cardiovascular system:RRR. No rubs or gallops. Gastrointestinal system: Abdomen is nondistended, soft and nontender. No organomegaly or masses felt. Normal bowel sounds heard. Central nervous system: No focal neurological deficits. Extremities: No cyanosis or clubbing; trace to 1+ edema appreciated bilaterally. Skin: No rashes, lesions or petechiae. Psychiatry: Judgement and insight appear normal. Mood & affect appropriate.   Data Reviewed: CBC: WBCs 8.7, hemoglobin 8.21 platelet count 234K Basic metabolic panel: Sodium 135, potassium 3.4, chloride 104, bicarb 21, BUN 10, creatinine 0.94 and GFR >60  Family Communication: No family at bedside.  Disposition: Status is: Inpatient Remains inpatient appropriate because: IV therapy.  Time spent: 50 minutes  Author: Justina Oman, MD 03/02/2024 2:25 PM  For on call review [email or web address].

## 2024-03-02 NOTE — Plan of Care (Signed)
  Problem: Clinical Measurements: Goal: Ability to maintain clinical measurements within normal limits will improve Outcome: Progressing Goal: Will remain free from infection Outcome: Progressing Goal: Diagnostic test results will improve Outcome: Progressing Goal: Respiratory complications will improve Outcome: Progressing Goal: Cardiovascular complication will be avoided Outcome: Progressing   Problem: Nutrition: Goal: Adequate nutrition will be maintained Outcome: Progressing   Problem: Coping: Goal: Level of anxiety will decrease Outcome: Progressing   Problem: Elimination: Goal: Will not experience complications related to bowel motility Outcome: Progressing Goal: Will not experience complications related to urinary retention Outcome: Progressing   Problem: Skin Integrity: Goal: Risk for impaired skin integrity will decrease Outcome: Progressing

## 2024-03-02 NOTE — Plan of Care (Signed)

## 2024-03-03 DIAGNOSIS — R6510 Systemic inflammatory response syndrome (SIRS) of non-infectious origin without acute organ dysfunction: Secondary | ICD-10-CM | POA: Diagnosis not present

## 2024-03-03 DIAGNOSIS — I10 Essential (primary) hypertension: Secondary | ICD-10-CM | POA: Diagnosis not present

## 2024-03-03 DIAGNOSIS — F3340 Major depressive disorder, recurrent, in remission, unspecified: Secondary | ICD-10-CM | POA: Diagnosis not present

## 2024-03-03 DIAGNOSIS — E039 Hypothyroidism, unspecified: Secondary | ICD-10-CM | POA: Diagnosis not present

## 2024-03-03 DIAGNOSIS — E871 Hypo-osmolality and hyponatremia: Secondary | ICD-10-CM

## 2024-03-03 MED ORDER — LOSARTAN POTASSIUM 50 MG PO TABS
50.0000 mg | ORAL_TABLET | Freq: Every day | ORAL | Status: DC
Start: 2024-03-03 — End: 2024-04-03

## 2024-03-03 MED ORDER — DEXAMETHASONE 1 MG PO TABS: ORAL_TABLET | ORAL | Status: DC

## 2024-03-03 NOTE — Plan of Care (Signed)

## 2024-03-03 NOTE — Plan of Care (Signed)
   Problem: Education: Goal: Knowledge of General Education information will improve Description: Including pain rating scale, medication(s)/side effects and non-pharmacologic comfort measures Outcome: Progressing   Problem: Clinical Measurements: Goal: Will remain free from infection Outcome: Progressing   Problem: Activity: Goal: Risk for activity intolerance will decrease Outcome: Progressing

## 2024-03-03 NOTE — Discharge Summary (Signed)
 Physician Discharge Summary   Patient: Bradley Goodman MRN: [id number] DOB: 02-01-1956  Admit date:     02/29/2024  Discharge date: 03/03/24  Discharge Physician: Justina Oman   PCP: [name], FNP   Recommendations at discharge:  Repeat CBC to follow hemoglobin trend/stability Repeat basic metabolic panel to follow electrolytes and renal function Reassess blood pressure and adjust medication as needed Make sure patient follow-up with oncology service as previously arranged. Repeat thyroid  panel and further adjust Synthroid  therapy as needed.  Discharge Diagnoses: Principal Problem:   SIRS (systemic inflammatory response syndrome) (HCC) Active Problems:   Generalized weakness   Essential hypertension   Depression, recurrent (HCC)   Adenocarcinoma of right lung, stage 4 (HCC)   Adrenal insufficiency (HCC)   Acquired hypothyroidism   Recurrent major depression in remission (HCC)   Type 2 diabetes mellitus (HCC)   Hyponatremia  Brief hospital admission narrative: As per H&P written by Dr. [name] on 02/29/2024 Bradley Goodman is a 68 y.o. 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 Patient presented to the ED with complaints of generalized weakness, nausea diarrhea and fever.  Weakness started 4 days ago.  He has had diarrhea over the past 2-3 days reports 3-4 watery stools yesterday and 2 today.  No abdominal pain.  Reports poor oral intake and nausea without vomiting.  No urinary symptoms..  Reports some nasal congestion, no difficulty breathing, reports fever of 100.4 at home.  He does not have a port for chemo.  No headache.   ED Course: Temperature 100.  Heart rate 111-122.  Respirate rate 15-28.  Blood pressure systolic 111-125. WBC 11.7.  Lactic acid 1.2. Chest x-ray without acute abnormality. IV Zosyn started. UA, urine and blood cultures pending. 2.5 L bolus  given.  Hospitalist to admit for SIRS.  Assessment and Plan: 1-SIRS -Most likely driven in the setting of GI losses, decreased oral intake and dehydration - Patient at high risk for infection given history of chemotherapy and chronic steroid usage - Patient received a total of 4 days of IV antibiotics without any identified source of infection. -Antibiotics were stopped and patient advised to maintain adequate hydration and to continue adequate supportive care - He fell ready and is trying to go home and demonstrated good knowledge and reliability for returning red flag symptoms.   2-acquired hypothyroidism - Continue Synthroid . -Repeat thyroid  panel at follow-up visit.   3-history of adrenal insufficiency - Patient chronically on Decadron  - With concerns for adrenal crisis - Decadron  has been placed on hold and patient started on Solu-Cortef. - Continue to follow clinical response.   4-essential hypertension - Blood pressure stable and rising at discharge - Will resume home oral antihypertensive agents at adjusted dose.  Antihypertensive agents; dose has been adjusted. - Follow vital signs.  5-generalized weakness/depression and poor oral intake - Patient advised to maintain adequate hydration - Continue supportive care - Continue the use of Remeron  and Effexor    6-type 2 diabetes - Currently diet controlled - Recent A1c 6.0 - Continue sliding scale insulin  and follow CBG fluctuation.   7-GERD - Continue PPI.   8-Adenocarcinoma of right lung, stage 4 [facility]) Diagnosed June 2022.  F/w Dr. Liam Redhead.  Currently on chemotherapy status post SRS to metastatic brain lesions- follows with Dr. Lorri Rota.  Per Oncology notes as of 5/19, patient had received 47 cycles.    9-anemia of chronic disease - No overt bleeding  appreciated - Decrease in patient's hemoglobin presumed to be associated with hemodilution from fluid resuscitation. - Hemoglobin 8.2 unclamping at discharge; might have  a component of chronic anemia associated with malignancy. -Will recommend continue follow-up of hemoglobin trend/stability with repeat CBC at follow-up visit.   10-hypokalemia - In the setting of GI losses and decreased oral intake - Electrolytes repleted and within normal limits at discharge - Continue to follow trend/stability.  Bowel   Consultants: None Procedures performed: See below for x-ray reports. Disposition: Home Diet recommendation: Heart healthy diet.  DISCHARGE MEDICATION: Allergies as of 03/03/2024   No Known Allergies      Medication List     TAKE these medications    acetaminophen  500 MG tablet Commonly known as: TYLENOL  Take 1,000 mg by mouth every 6 (six) hours as needed for mild pain (pain score 1-3) or fever.   B-D 3CC LUER-LOK SYR 21GX1-1/2 21G X 1-1/2" 3 ML Misc Generic drug: SYRINGE-NEEDLE (DISP) 3 ML Use to inject testosterone  every week   chlorproMAZINE  10 MG tablet Commonly known as: THORAZINE  Take 1 tablet (10 mg total) by mouth 3 (three) times daily as needed for hiccoughs.   cholecalciferol  1000 units tablet Commonly known as: VITAMIN D  Take 1,000 Units by mouth every evening.   dexamethasone  1 MG tablet Commonly known as: DECADRON  Take 1 tablet BID X 3 days; then resume 1 tablet daily. What changed:  how much to take how to take this when to take this additional instructions   folic acid  1 MG tablet Commonly known as: FOLVITE  Take 1 tablet (1 mg total) by mouth daily. What changed: when to take this   Krill Oil 300 MG Caps Take 300 mg by mouth every evening.   levothyroxine  88 MCG tablet Commonly known as: SYNTHROID  Take 1 tablet (88 mcg total) by mouth daily before breakfast.   loratadine  10 MG tablet Commonly known as: CLARITIN  Take 10 mg by mouth every evening.   losartan  50 MG tablet Commonly known as: COZAAR  Take 1 tablet (50 mg total) by mouth daily. What changed: how much to take   mirtazapine  15 MG  tablet Commonly known as: REMERON  Take 1 tablet (15 mg total) by mouth at bedtime.   MULTIVITAMIN GUMMIES ADULT PO Take 2 tablets by mouth every evening.   ondansetron  4 MG tablet Commonly known as: ZOFRAN  Take 1 tablet (4 mg total) by mouth every 6 (six) hours as needed for nausea.   pantoprazole  40 MG tablet Commonly known as: PROTONIX  Take 1 tablet (40 mg total) by mouth every evening.   polyethylene glycol powder 17 GM/SCOOP powder Commonly known as: GLYCOLAX/MIRALAX Take 119 g by mouth daily as needed for mild constipation or moderate constipation.   rosuvastatin  20 MG tablet Commonly known as: CRESTOR  Take 1 tablet (20 mg total) by mouth daily. What changed: when to take this   testosterone  cypionate 100 MG/ML injection Commonly known as: DEPOTESTOTERONE CYPIONATE Take 50mg  ( 0.65ml) and 100mg  ( 1ml) into muscle alternatively weekly. What changed:  how much to take how to take this when to take this   venlafaxine  XR 75 MG 24 hr capsule Commonly known as: EFFEXOR -XR Take 2 capsules (150 mg total) by mouth daily with breakfast. What changed: when to take this        Follow-up Information     [name], Georgeann Kindred, FNP. Schedule an appointment as soon as possible for a visit in 10 day(s).   Specialty: Family Medicine Contact information: [address]  [place] [address] [phone]                Discharge Exam: Bradley Goodman Weights   02/29/24 1142  Weight: 81.6 kg   General exam: Alert, awake, oriented x 3 Respiratory system: Clear to auscultation. Respiratory effort normal. Cardiovascular system:RRR. No murmurs, rubs, gallops. Gastrointestinal system: Abdomen is nondistended, soft and nontender. No organomegaly or masses felt. Normal bowel sounds heard. Central nervous system: Alert and oriented. No focal neurological deficits. Extremities: No C/C/E, +pedal pulses Skin: No rashes, lesions or ulcers Psychiatry: Judgement and insight appear normal. Mood  & affect appropriate.   Condition at discharge: Stable and improved.  The results of significant diagnostics from this hospitalization (including imaging, microbiology, ancillary and laboratory) are listed below for reference.   Imaging Studies: DG Chest Port 1 View Result Date: 02/29/2024 CLINICAL DATA:  Questionable sepsis - evaluate for abnormality EXAM: PORTABLE CHEST 1 VIEW COMPARISON:  January 27, 2024., January 22, 2024 FINDINGS: The cardiomediastinal silhouette is unchanged in contour. No pleural effusion. No pneumothorax. No acute pleuroparenchymal abnormality. IMPRESSION: No acute cardiopulmonary abnormality. Electronically Signed   By: Clancy Crimes M.D.   On: 02/29/2024 12:59   MR BRAIN W WO CONTRAST Result Date: 02/13/2024 CLINICAL DATA:  Provided history: Malignant neoplasm metastatic to brain. Brain/CNS neoplasm, assess treatment response. EXAM: MRI HEAD WITHOUT AND WITH CONTRAST TECHNIQUE: Multiplanar, multiecho pulse sequences of the brain and surrounding structures were obtained without and with intravenous contrast. CONTRAST:  8 mL Vueway  intravenous contrast. COMPARISON:  Prior brain MRI examinations 08/15/2023 and earlier. FINDINGS: Brain: No age-advanced or lobar predominant cerebral atrophy. Punctate focus of enhancement within the posterior left frontal lobe, not appreciated on prior examinations and indeterminate for a new metastasis versus vascular enhancement (series 13, image 138). Continued slight interval increase in size of an enhancing lesion within the right parietal lobe, now measuring 3.5 mm (previously 3 mm) (series 13, image 84). Punctate enhancing lesion within the left cerebellar hemisphere, unchanged (series 13, image 46). Moderate for age multifocal T2 FLAIR hyperintense signal abnormality within the cerebral white matter, nonspecific but compatible with chronic small vessel ischemic disease. No cortical encephalomalacia is identified. There is no acute infarct. No  chronic intracranial blood products. No extra-axial fluid collection. No midline shift. Vascular: Maintained flow voids within the proximal large arterial vessels. Skull and upper cervical spine: No focal worrisome marrow lesion. Sinuses/Orbits: No mass or acute finding within the imaged orbits. Mild mucosal thickening within the bilateral ethmoid, right sphenoid and right maxillary sinuses. Other: Small right mastoid effusion. Impression #1 will be called to the ordering clinician or representative by the Radiologist Assistant, and communication documented in the PACS or [employer]. IMPRESSION: 1. Punctate focus of enhancement within the posterior left frontal lobe, not appreciated on prior examinations and indeterminate for a new metastasis versus vascular enhancement. A short-interval follow-up brain MRI (with and without contrast) is recommended for surveillance. 2. Continued slight increase in size of an enhancing lesion within the right parietal lobe, now measuring 3.5 mm (previously 3 mm). 3. Unchanged punctate enhancing lesion within the left cerebellar hemisphere. 4. Moderate cerebral white matter chronic small vessel ischemic disease. 5. Mild paranasal sinus mucosal thickening. 6. Small right mastoid effusion. Electronically Signed   By: Bascom Lily D.O.   On: 02/13/2024 10:12    Microbiology: Results for orders placed or performed during the hospital encounter of 02/29/24  Resp panel by RT-PCR (RSV, Flu A&B, Covid) Anterior Nasal Swab  Status: None   Collection Time: 02/29/24 11:56 AM   Specimen: Anterior Nasal Swab  Result Value Ref Range Status   SARS Coronavirus 2 by RT PCR NEGATIVE NEGATIVE Final   Influenza A by PCR NEGATIVE NEGATIVE Final   Influenza B by PCR NEGATIVE NEGATIVE Final   Resp Syncytial Virus by PCR NEGATIVE NEGATIVE Final    Comment: Performed at [facility], [address] [place], [place] [address]  Blood Culture (routine x 2)     Status: None (Preliminary  result)   Collection Time: 02/29/24 11:56 AM   Specimen: BLOOD  Result Value Ref Range Status   Specimen Description BLOOD  Final   Special Requests NONE  Final   Culture   Final    NO GROWTH 3 DAYS Performed at [facility], [address], [place], [place] [address]    Report Status PENDING  Incomplete  MRSA Next Gen by PCR, Nasal     Status: None   Collection Time: 02/29/24 12:20 PM   Specimen: Nasal Mucosa; Nasal Swab  Result Value Ref Range Status   MRSA by PCR Next Gen NOT DETECTED NOT DETECTED Final    Comment: (NOTE) The GeneXpert MRSA Assay (FDA approved for NASAL specimens only), is one component of a comprehensive MRSA colonization surveillance program. It is not intended to diagnose MRSA infection nor to guide or monitor treatment for MRSA infections. Test performance is not FDA approved in patients less than 42 years old. Performed at [facility], [address] [place], [place] [address]   Blood Culture (routine x 2)     Status: None (Preliminary result)   Collection Time: 02/29/24  2:43 PM   Specimen: BLOOD  Result Value Ref Range Status   Specimen Description BLOOD BLOOD RIGHT HAND  Final   Special Requests NONE  Final   Culture   Final    NO GROWTH 3 DAYS Performed at [facility], [address] [place], [place] [address]    Report Status PENDING  Incomplete  Urine Culture (for pregnant, neutropenic or urologic patients or patients with an indwelling urinary catheter)     Status: None   Collection Time: 02/29/24  3:53 PM   Specimen: Urine, Clean Catch  Result Value Ref Range Status   Specimen Description   Final    URINE, CLEAN CATCH Performed at [facility], [address] [place], [place] [address]    Special Requests   Final    NONE Performed at [facility], [address] [place], [place] [address]    Culture   Final    NO GROWTH Performed at [facility], [address], [place], [place] [address]    Report Status 03/01/2024 FINAL   Final   *Note: Due to a large number of results and/or encounters for the requested time period, some results have not been displayed. A complete set of results can be found in Results Review.    Labs: CBC: Recent Labs  Lab 02/29/24 1156 03/01/24 0446 03/02/24 0622  WBC 11.7* 6.8 8.7  NEUTROABS 10.0*  --   --   HGB 10.2* 7.7* 8.2*  HCT 33.5* 25.0* 27.6*  MCV 84.6 85.9 83.9  PLT 329 236 234   Basic Metabolic Panel: Recent Labs  Lab 02/29/24 1156 03/01/24 0446 03/02/24 0622  NA 132* 133* 135  K 3.9 3.4* 3.4*  CL 102 107 104  CO2 22 22 21*  GLUCOSE 123* 98 173*  BUN 12 9 10   CREATININE  1.09 1.06 0.94  CALCIUM  8.4* 7.6* 8.1*  MG  --   --  2.0   Liver Function Tests: Recent Labs  Lab 02/29/24 1156  AST 37  ALT 36  ALKPHOS 86  BILITOT 1.3*  PROT 6.8  ALBUMIN 3.0*   CBG: Recent Labs  Lab 03/02/24 2155  GLUCAP 182*    Discharge time spent: greater than 30 minutes.  Signed: Justina Oman, MD [facility] 03/03/2024

## 2024-03-04 ENCOUNTER — Encounter: Payer: Self-pay | Admitting: "Endocrinology

## 2024-03-05 LAB — CULTURE, BLOOD (ROUTINE X 2)
Culture: NO GROWTH
Culture: NO GROWTH

## 2024-03-06 ENCOUNTER — Other Ambulatory Visit (HOSPITAL_COMMUNITY)
Admission: RE | Admit: 2024-03-06 | Discharge: 2024-03-06 | Disposition: A | Discharge status: Home / Self Care | Source: Ambulatory Visit | Attending: "Endocrinology | Admitting: "Endocrinology

## 2024-03-06 DIAGNOSIS — Z79890 Hormone replacement therapy (postmenopausal): Secondary | ICD-10-CM | POA: Insufficient documentation

## 2024-03-06 DIAGNOSIS — Z1503 Genetic susceptibility to malignant neoplasm of prostate: Secondary | ICD-10-CM | POA: Diagnosis not present

## 2024-03-06 DIAGNOSIS — E230 Hypopituitarism: Secondary | ICD-10-CM | POA: Insufficient documentation

## 2024-03-06 LAB — COMPREHENSIVE METABOLIC PANEL WITH GFR
ALT: 52 U/L — ABNORMAL HIGH (ref 0–44)
AST: 49 U/L — ABNORMAL HIGH (ref 15–41)
Albumin: 2.8 g/dL — ABNORMAL LOW (ref 3.5–5.0)
Alkaline Phosphatase: 71 U/L (ref 38–126)
Anion gap: 10 (ref 5–15)
BUN: 15 mg/dL (ref 8–23)
CO2: 27 mmol/L (ref 22–32)
Calcium: 8.9 mg/dL (ref 8.9–10.3)
Chloride: 105 mmol/L (ref 98–111)
Creatinine, Ser: 0.98 mg/dL (ref 0.61–1.24)
GFR, Estimated: >60 mL/min (ref >60)
Glucose, Bld: 99 mg/dL (ref 70–99)
Potassium: 3.8 mmol/L (ref 3.5–5.1)
Sodium: 142 mmol/L (ref 135–145)
Total Bilirubin: 0.3 mg/dL (ref 0.0–1.2)
Total Protein: 6.5 g/dL (ref 6.5–8.1)

## 2024-03-06 LAB — TSH: TSH: 0.294 u[IU]/mL — ABNORMAL LOW (ref 0.350–4.500)

## 2024-03-06 LAB — T4, FREE: Free T4: 0.48 ng/dL — ABNORMAL LOW (ref 0.61–1.12)

## 2024-03-06 LAB — PSA: Prostatic Specific Antigen: 3.08 ng/mL (ref 0.00–4.00)

## 2024-03-07 LAB — TESTOSTERONE,FREE AND TOTAL
Testosterone, Free: 0.6 pg/mL — ABNORMAL LOW (ref 6.6–18.1)
Testosterone: 65 ng/dL — ABNORMAL LOW (ref 264–916)

## 2024-03-10 ENCOUNTER — Encounter: Payer: Self-pay | Admitting: Family Medicine

## 2024-03-10 ENCOUNTER — Other Ambulatory Visit: Payer: Self-pay | Admitting: Family Medicine

## 2024-03-10 ENCOUNTER — Ambulatory Visit (INDEPENDENT_AMBULATORY_CARE_PROVIDER_SITE_OTHER): Payer: Commercial Managed Care - PPO | Admitting: "Endocrinology

## 2024-03-10 ENCOUNTER — Encounter: Payer: Self-pay | Admitting: Internal Medicine

## 2024-03-10 ENCOUNTER — Other Ambulatory Visit: Payer: Self-pay

## 2024-03-10 ENCOUNTER — Encounter: Payer: Self-pay | Admitting: "Endocrinology

## 2024-03-10 VITALS — BP 146/88 | HR 96 | Ht 71.0 in | Wt 176.8 lb

## 2024-03-10 DIAGNOSIS — E039 Hypothyroidism, unspecified: Secondary | ICD-10-CM | POA: Diagnosis not present

## 2024-03-10 DIAGNOSIS — E230 Hypopituitarism: Secondary | ICD-10-CM

## 2024-03-10 DIAGNOSIS — E1165 Type 2 diabetes mellitus with hyperglycemia: Secondary | ICD-10-CM

## 2024-03-10 DIAGNOSIS — E2740 Unspecified adrenocortical insufficiency: Secondary | ICD-10-CM

## 2024-03-10 DIAGNOSIS — E291 Testicular hypofunction: Secondary | ICD-10-CM

## 2024-03-10 MED ORDER — TESTOSTERONE CYPIONATE 100 MG/ML IM SOLN: INTRAMUSCULAR | 1 refills | Status: DC

## 2024-03-10 MED ORDER — BD LUER-LOK SYRINGE 21G X 1-1/2" 3 ML MISC
2 refills | Status: AC
  Filled 2024-03-10: qty 25, 90d supply, fill #0
  Filled 2024-07-21: qty 25, 90d supply, fill #1
  Filled 2024-10-15: qty 25, 90d supply, fill #2

## 2024-03-10 MED ORDER — LEVOTHYROXINE SODIUM 100 MCG PO TABS
100.0000 ug | ORAL_TABLET | Freq: Every day | ORAL | 1 refills | Status: DC
  Filled 2024-03-10: qty 90, 90d supply, fill #0
  Filled 2024-06-18: qty 90, 90d supply, fill #1

## 2024-03-10 MED ORDER — FREESTYLE LIBRE 3 PLUS SENSOR MISC
11 refills | Status: DC
  Filled 2024-03-10: qty 1, 15d supply, fill #0
  Filled 2024-03-23: qty 1, 15d supply, fill #1
  Filled 2024-04-08: qty 2, 30d supply, fill #2
  Filled 2024-04-29 – 2024-04-30 (×2): qty 2, 30d supply, fill #3
  Filled 2024-06-02: qty 2, 30d supply, fill #4
  Filled 2024-07-02: qty 2, 30d supply, fill #5
  Filled 2024-07-27: qty 2, 30d supply, fill #6

## 2024-03-10 NOTE — Progress Notes (Signed)
 Endocrinology follow-up note                                             03/10/2024, 12:47 PM   Subjective:    Patient ID: Bradley Goodman, male    DOB: 1956-01-16, PCP [name], FNP   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   PONV (postoperative nausea and vomiting)    Type 2 diabetes mellitus (HCC)    Past Surgical History:  Procedure Laterality Date   Bilateral inguinal hernia repair     BRONCHIAL NEEDLE ASPIRATION BIOPSY  04/05/2021   Procedure: BRONCHIAL NEEDLE ASPIRATION BIOPSIES;  Surgeon: Prudy Brownie, DO;  Location: [facility];  Service: Pulmonary;;   COLONOSCOPY  01/23/2012   Procedure: COLONOSCOPY;  Surgeon: Suzette Espy, MD;  Location: [facility];  Service: Endoscopy;  Laterality: N/A;  9:30 AM   COLONOSCOPY N/A 10/11/2015   Procedure: COLONOSCOPY;  Surgeon: Suzette Espy, MD;  Location: [facility];  Service: Endoscopy;  Laterality: N/A;  830   COLONOSCOPY N/A 10/14/2019   Procedure: COLONOSCOPY;  Surgeon: Suzette Espy, MD;  Location: [facility];  Service: Endoscopy;  Laterality: N/A;  1:45   POLYPECTOMY  10/14/2019   Procedure: POLYPECTOMY;  Surgeon: Suzette Espy, MD;  Location: [facility];  Service: Endoscopy;;  ascending colon, descending colon   VIDEO BRONCHOSCOPY WITH ENDOBRONCHIAL ULTRASOUND N/A 04/05/2021   Procedure: VIDEO BRONCHOSCOPY WITH ENDOBRONCHIAL ULTRASOUND;  Surgeon: Prudy Brownie, DO;  Location: [facility];  Service: Pulmonary;  Laterality: N/A;   Social History   Socioeconomic History   Marital status: Married    Spouse name: Not on file   Number of children: Not on file   Years of education: Not on file   Highest education level: Bachelor's degree (e.g., BA, AB, BS)  Occupational History    Not on file  Tobacco Use   Smoking status: Every Day    Current packs/day: 0.50    Average packs/day: 0.5 packs/day for 50.4 years (25.2 ttl pk-yrs)    Types: Cigarettes    Start date: 10/30/1973   Smokeless tobacco: Never  Vaping Use   Vaping status: Never Used  Substance and Sexual Activity   Alcohol use: Not Currently    Comment: One drink every 6 months.   Drug use: No   Sexual activity: Yes  Other Topics Concern   Not on file  Social History Narrative   Not on file   Social Drivers of Health   Financial Resource Strain: Patient Declined (10/14/2023)   Overall Financial Resource Strain (CARDIA)    Difficulty of Paying Living Expenses: Patient declined  Food Insecurity: No Food Insecurity (02/29/2024)   Hunger Vital Sign    Worried About Running Out of Food in the Last Year: Never true    Ran Out of Food in the Last Year: Never true  Transportation Needs: No Transportation Needs (02/29/2024)   PRAPARE - Administrator, Civil Service (Medical): No    Lack of Transportation (Non-Medical): No  Physical Activity:  Unknown (10/14/2023)   Exercise Vital Sign    Days of Exercise per Week: 1 day    Minutes of Exercise per Session: Patient declined  Stress: No Stress Concern Present (10/14/2023)   [employer] of Occupational Health - Occupational Stress Questionnaire    Feeling of Stress : Not at all  Social Connections: Moderately Isolated (02/29/2024)   Social Connection and Isolation Panel [NHANES]    Frequency of Communication with Friends and Family: More than three times a week    Frequency of Social Gatherings with Friends and Family: Once a week    Attends Religious Services: Never    Database administrator or Organizations: No    Attends Engineer, structural: Never    Marital Status: Married   Family History  Problem Relation Age of Onset   Hypertension Mother    Diabetes Mother    Heart attack Mother    Hypertension Father    Heart attack  Father    Heart attack Brother    Colon cancer Neg Hx    Outpatient Encounter Medications as of 03/10/2024  Medication Sig   acetaminophen  (TYLENOL ) 500 MG tablet Take 1,000 mg by mouth every 6 (six) hours as needed for mild pain (pain score 1-3) or fever.   chlorproMAZINE  (THORAZINE ) 10 MG tablet Take 1 tablet (10 mg total) by mouth 3 (three) times daily as needed for hiccoughs.   cholecalciferol  (VITAMIN D ) 1000 UNITS tablet Take 1,000 Units by mouth every evening.   dexamethasone  (DECADRON ) 1 MG tablet Take 1 tablet BID X 3 days; then resume 1 tablet daily.   folic acid  (FOLVITE ) 1 MG tablet Take 1 tablet (1 mg total) by mouth daily. (Patient taking differently: Take 1 mg by mouth at bedtime.)   Krill Oil 300 MG CAPS Take 300 mg by mouth every evening.   levothyroxine  (SYNTHROID ) 100 MCG tablet Take 1 tablet (100 mcg total) by mouth daily before breakfast.   loratadine  (CLARITIN ) 10 MG tablet Take 10 mg by mouth every evening.    losartan  (COZAAR ) 50 MG tablet Take 1 tablet (50 mg total) by mouth daily.   mirtazapine  (REMERON ) 15 MG tablet Take 1 tablet (15 mg total) by mouth at bedtime.   Multiple Vitamins-Minerals (MULTIVITAMIN GUMMIES ADULT PO) Take 2 tablets by mouth every evening.   ondansetron  (ZOFRAN ) 4 MG tablet Take 1 tablet (4 mg total) by mouth every 6 (six) hours as needed for nausea.   pantoprazole  (PROTONIX ) 40 MG tablet Take 1 tablet (40 mg total) by mouth every evening.   polyethylene glycol powder (GLYCOLAX /MIRALAX ) 17 GM/SCOOP powder Take 119 g by mouth daily as needed for mild constipation or moderate constipation.   rosuvastatin  (CRESTOR ) 20 MG tablet Take 1 tablet (20 mg total) by mouth daily. (Patient taking differently: Take 20 mg by mouth at bedtime.)   SYRINGE-NEEDLE, DISP, 3 ML (B-D 3CC LUER-LOK SYR 21GX1-1/2) 21G X 1-1/2" 3 ML MISC Use to inject testosterone  every week   testosterone  cypionate (DEPOTESTOTERONE CYPIONATE) 100 MG/ML injection Take 50mg  ( 0.104ml) and  100mg  ( 1ml) into muscle alternatively weekly.   venlafaxine  XR (EFFEXOR -XR) 75 MG 24 hr capsule Take 2 capsules (150 mg total) by mouth daily with breakfast. (Patient taking differently: Take 150 mg by mouth at bedtime.)   [DISCONTINUED] levothyroxine  (SYNTHROID ) 88 MCG tablet Take 1 tablet (88 mcg total) by mouth daily before breakfast.   [DISCONTINUED] SYRINGE-NEEDLE, DISP, 3 ML (B-D 3CC LUER-LOK SYR 21GX1-1/2) 21G X 1-1/2" 3 ML MISC  Use to inject testosterone  every week   [DISCONTINUED] testosterone  cypionate (DEPOTESTOTERONE CYPIONATE) 100 MG/ML injection Take 50mg  ( 0.81ml) and 100mg  ( 1ml) into muscle alternatively weekly. (Patient taking differently: Inject 50-100 mg into the muscle See admin instructions. Take 50mg  ( 0.74ml) and 100mg  ( 1ml) into muscle alternatively weekly.)   No facility-administered encounter medications on file as of 03/10/2024.   ALLERGIES: No Known Allergies  VACCINATION STATUS: Immunization History  Administered Date(s) Administered   Fluad Quad(high Dose 65+) 07/28/2022   Hepatitis B 02/06/1990, 10/28/1990, 04/02/1991   Influenza,inj,Quad PF,6+ Mos 07/07/2019, 07/31/2021   Influenza-Unspecified 07/25/2018   Moderna Sars-Covid-2 Vaccination 12/01/2019, 12/30/2019, 10/19/2020   Pneumococcal Conjugate-13 03/11/2017   Tdap 01/17/2016    HPI KOHL POLINSKY is 68 y.o. male who presents today for follow-up after he was seen in consultation for multiple medical problems as follows.  See notes from previous visits. History is obtained directly from the patient as well as chart review. He is accompanied by his wife.  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   He is status post radiosurgery of CNS metastasis and chemotherapy.  His chemotherapy  involved high-dose steroids in the form of dexamethasone  with subsequent hypopituitarism,  currently on dexamethasone  2 mg p.o. daily.      His labs showed hypogonadism,  partial adrenal insufficiency, and hypothyroidism.  For this endocrine deficits, he was started on testosterone  injection 50 mg 100 mg IM in alternate weeks, levothyroxine , and Decadron .  There was some interruption in his testosterone  and levothyroxine  during his recent hospitalization. His previsit labs are showing inadequate replacement.  He has no new complaints today.   He remains on dexamethasone  1 mg p.o. daily in the morning for glucocorticoid deficit.   He denies prior testicular injury.  He denies testicular radiation . His previsit thyroid  function tests are consistent with appropriate replacement, currently on levothyroxine  88 mcg p.o. daily before breakfast.     He wishes to be continued on testosterone  replacement therapy as well as his other hormones.  Continues to gain weight progressively.  He continues to smoke. He is back to enjoy his outdoor activities including golfing.    Review of Systems  Constitutional: + Stable weight,   + fatigue, + subjective hypothermia, + low libido  Objective:       03/10/2024   10:29 AM 03/03/2024   12:35 PM 03/03/2024    4:26 AM  Vitals with BMI  Height 5\' 11"     Weight 176 lbs 13 oz    BMI 24.67    Systolic 146 159 161  Diastolic 88 93 89  Pulse 96 81 86    BP (!) 146/88   Pulse 96   Ht 5\' 11"  (1.803 m)   Wt 176 lb 12.8 oz (80.2 kg)   BMI 24.66 kg/m   Wt Readings from Last 3 Encounters:  03/10/24 176 lb 12.8 oz (80.2 kg)  02/29/24 180 lb (81.6 kg)  02/24/24 179 lb 0.7 oz (81.2 kg)    Physical Exam  Constitutional:  Body mass index is 24.66 kg/m.,  not in acute distress, normal state of mind Eyes: PERRLA, EOMI, no exophthalmos ENT: moist mucous membranes, no gross thyromegaly, no gross cervical lymphadenopathy   CMP ( most recent) CMP     Component Value Date/Time   NA 142 03/06/2024 0802   NA 144 11/26/2023 1135   K 3.8 03/06/2024 0802   CL 105 03/06/2024 0802   CO2 27  03/06/2024 0802   GLUCOSE 99 03/06/2024  0802   BUN 15 03/06/2024 0802   BUN 11 11/26/2023 1135   CREATININE 0.98 03/06/2024 0802   CREATININE 0.98 02/24/2024 1324   CALCIUM  8.9 03/06/2024 0802   PROT 6.5 03/06/2024 0802   PROT 6.0 11/26/2023 1135   ALBUMIN 2.8 (L) 03/06/2024 0802   ALBUMIN 3.6 (L) 11/26/2023 1135   AST 49 (H) 03/06/2024 0802   AST 21 02/24/2024 1324   ALT 52 (H) 03/06/2024 0802   ALT 23 02/24/2024 1324   ALKPHOS 71 03/06/2024 0802   BILITOT 0.3 03/06/2024 0802   BILITOT 0.2 02/24/2024 1324   GFRNONAA >60 03/06/2024 0802   GFRNONAA >60 02/24/2024 1324   GFRAA 82 07/14/2020 0945     Diabetic Labs (most recent): Lab Results  Component Value Date   HGBA1C 6.0 (H) 01/14/2024   HGBA1C 5.8 (H) 11/18/2023   HGBA1C 5.5 10/24/2023   MICROALBUR neg 07/13/2014     Lipid Panel ( most recent) Lipid Panel     Component Value Date/Time   CHOL 103 10/25/2023 0907   CHOL 114 06/07/2023 0825   TRIG 158 (H) 10/25/2023 0907   TRIG 87 10/18/2016 0851   HDL 37 (L) 10/25/2023 0907   HDL 31 (L) 06/07/2023 0825   HDL 37 (L) 10/18/2016 0851   CHOLHDL 2.8 10/25/2023 0907   VLDL 32 10/25/2023 0907   LDLCALC 34 10/25/2023 0907   LDLCALC 43 06/07/2023 0825   LDLCALC 53 07/13/2014 0925   LABVLDL 40 06/07/2023 0825      Lab Results  Component Value Date   TSH 0.294 (L) 03/06/2024   TSH 0.210 (L) 10/29/2023   TSH 0.199 (L) 10/25/2023   TSH 0.133 (L) 10/08/2023   TSH 0.384 (L) 06/07/2023   TSH 0.768 04/29/2023   TSH 0.468 04/15/2023   TSH 0.839 03/26/2023   TSH 1.189 02/12/2023   TSH 0.802 12/12/2022   FREET4 0.48 (L) 03/06/2024   FREET4 0.88 10/29/2023   FREET4 0.76 10/25/2023   FREET4 0.81 04/29/2023   FREET4 0.81 12/10/2022   FREET4 0.60 (L) 09/07/2022   FREET4 0.76 06/04/2022   FREET4 0.58 (L) 01/22/2022   FREET4 0.75 09/22/2021      Latest Reference Range & Units 01/22/22 08:18 01/22/22 08:20  Sex Horm Binding Glob, Serum 19.3 - 76.4 nmol/L  16.7 (L)  Testosterone  264 - 916 ng/dL  409 (L)   Testosterone  Free 6.6 - 18.1 pg/mL  4.7 (L)  Testosterone -% Free 0.2 - 0.7 %  1.9 (H)  TSH 0.350 - 4.500 uIU/mL 1.858   Triiodothyronine,Free,Serum 2.0 - 4.4 pg/mL 2.0   T4,Free(Direct) 0.61 - 1.12 ng/dL 8.11 (L)   (L): Data is abnormally low (H): Data is abnormally high  Assessment & Plan:   1. Hypopituitarism  2.  Hypogonadism 3.hypothyroidism 4.partial adrenal insufficiency -He is accompanied by his wife to clinic.  I reviewed and discussed his recent lab findings with him.   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 -He is advised to continue Decadron  1 mg p.o. daily at breakfast.   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  His previsit thyroid  function tests are consistent with inadequate replacement.  I discussed and increase his levothyroxine  to 100 mcg p.o. daily before breakfast.      TSH may be unreliable in this patient, hence, we will use Free T4 exclusively for dose adjustment.     -  We discussed about the correct intake of his thyroid  hormone, on empty stomach at fasting, with water , separated by at least 30 minutes from breakfast and other medications,  and separated by more than 4 hours from calcium , iron, multivitamins, acid reflux medications (PPIs). -Patient is made aware of the fact that thyroid  hormone replacement is needed for life, dose to be adjusted by periodic monitoring of thyroid  function tests.   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  His previsit labs are showing inadequate replacement.  This is due to treatment withdrawal.  He is advised to Resume testosterone  cypionate at 50 mg intramuscularly every week until next measurement.  This will give him a total testosterone  of 200 mg nightly.    -Treatment target for him will be between  250-350 ng per DL, with the aim being to help him with libido as well as possibly avoid further loss of skeletal muscle mass.  Regarding his type 2 diabetes, his recent A1c was 6%, recently off of Ozempic .  He will be kept off of medication for diabetes at this time.   The patient was counseled on the dangers of tobacco use, and was advised to quit.  Reviewed strategies to maximize success, including removing cigarettes and smoking materials from environment.   - he is advised to maintain close follow up with his oncologist, neurologist, radiologist and PCP [name], FNP for primary care needs.   I spent  26  minutes in the care of the patient today including review of labs from Thyroid  Function, CMP, and other relevant labs ; imaging/biopsy records (current and previous including abstractions from other facilities); face-to-face time discussing  his lab results and symptoms, medications doses, his options of short and long term treatment based on the latest standards of care / guidelines;   and documenting the encounter.  Quamaine D Schulke  participated in the discussions, expressed understanding, and voiced agreement with the above plans.  All questions were answered to his satisfaction. he is encouraged to contact clinic should he have any questions or concerns prior to his return visit.    Follow up plan: No follow-ups on file.   Kalvin Orf, MD [facility] [address] [place], [place] [address] Phone: [phone]  Fax: [phone]     03/10/2024, 12:47 PM  This note was partially dictated with voice recognition software. Similar sounding words can be transcribed inadequately or may not  be corrected upon review.

## 2024-03-11 ENCOUNTER — Other Ambulatory Visit: Payer: Self-pay

## 2024-03-11 NOTE — Progress Notes (Unsigned)
 [facility] OFFICE PROGRESS NOTE  Bradley Jules, FNP [address] [place] [address]  DIAGNOSIS: Stage IV (T1b, N3, M1C) non-small cell lung cancer, favoring adenocarcinoma presented with right upper lobe lung nodule in addition to right hilar, subcarinal and bilateral mediastinal as well as supraclavicular lymphadenopathy in addition to bone and brain metastasis diagnosed in June 2022.   PD-L1 expression 80%.     Molecular Studies:  Biomarker Findings Microsatellite status - MS-Stable Tumor Mutational Burden - 6 Muts/Mb Genomic Findings For a complete list of the genes assayed, please refer to the Appendix. KRAS G12C, amplification ATM S470* CCND1 amplification - equivocal? HGF amplification - equivocal? MYC amplification - equivocal? FGF19 amplification - equivocal? FGF3 amplification - equivocal? FGF4 amplification - equivocal? NFKBIA amplification NKX2-1 amplification RAD21 amplification - equivocal? [id number]*26 TERT promoter -124C>T TP53 rearrangement exon 9 7 Disease relevant genes with no reportable alterations: ALK, BRAF, EGFR, ERBB2, MET, RET, ROS1   PRIOR THERAPY: SRS to the metastatic brain lesions under the care of Dr. Lorri Goodman.  Last treatment on 05/04/2021.   CURRENT THERAPY:  Palliative systemic chemotherapy with carboplatin  for an AUC 5, Alimta  500 mg/m2 and, Keytruda  200 mg IV every 3 weeks.  First dose expected on 05/08/2021.  Status post 46 cycles.  Starting from cycle #5 he is on maintenance treatment with Alimta  and Keytruda  every 3 weeks.    INTERVAL HISTORY: Bradley Goodman 68 y.o. male returns to the clinic today for a follow-up visit accompanied by his wife. The patient was last seen by Dr. Marguerita Goodman 3 weeks ago. In the interval since being seen, he presented to the ER with generalized weakness, fever, nausea, and diarrhea on 5/24-5/27. He received IV abx for SIRs. No source of infection was identified. Since being discharged,  he is feeling "a lot better" and almost back to normal. He is currently on treatment with Alimta  and keytruda .  He tolerates well except for some mild fatigue after treatment. He states his energy is almost back to normal.  Today, he denies any fever, chills, or night sweats. Denies any dyspnea, cough, chest pain, or hemoptysis.  Denies any nausea, vomiting, diarrhea, or constipation. He denies unusual headaches.  Denies rashes and skin changes except for easy bruising. He is on steroids for adrenal insufficiency. He is followed by endocrinology. He also has hypothyroidism and hypogonadism. He previously had leg swelling but states that improved. He works two days a week and plans to return to work on Saturday. He has also planned a golf outing, indicating a return to his usual activities. He is here today for evaluation repeat blood work before undergoing cycle #48    MEDICAL HISTORY: 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   PONV (postoperative nausea and vomiting)    Type 2 diabetes mellitus (HCC)     ALLERGIES:  has no known allergies.  MEDICATIONS:  Current Outpatient Medications  Medication Sig Dispense Refill   acetaminophen  (TYLENOL ) 500 MG tablet Take 1,000 mg by mouth every 6 (six) hours as needed for mild pain (pain score 1-3) or fever.     chlorproMAZINE  (THORAZINE ) 10 MG tablet Take 1 tablet (10 mg total) by mouth 3 (three) times daily as needed for  hiccoughs. 90 tablet 0   cholecalciferol  (VITAMIN D ) 1000 UNITS tablet Take 1,000 Units by mouth every evening.     Continuous Glucose Sensor (FREESTYLE LIBRE 3 PLUS SENSOR) MISC Change sensor every 15 days. 1 each 11   dexamethasone  (DECADRON ) 1 MG tablet Take 1 tablet BID X 3 days; then resume 1 tablet daily.     folic  acid (FOLVITE ) 1 MG tablet Take 1 tablet (1 mg total) by mouth daily. (Patient taking differently: Take 1 mg by mouth at bedtime.) 90 tablet 0   Krill Oil 300 MG CAPS Take 300 mg by mouth every evening.     levothyroxine  (SYNTHROID ) 100 MCG tablet Take 1 tablet (100 mcg total) by mouth daily before breakfast. 90 tablet 1   loratadine  (CLARITIN ) 10 MG tablet Take 10 mg by mouth every evening.      losartan  (COZAAR ) 50 MG tablet Take 1 tablet (50 mg total) by mouth daily.     mirtazapine  (REMERON ) 15 MG tablet Take 1 tablet (15 mg total) by mouth at bedtime. 90 tablet 1   Multiple Vitamins-Minerals (MULTIVITAMIN GUMMIES ADULT PO) Take 2 tablets by mouth every evening.     ondansetron  (ZOFRAN ) 4 MG tablet Take 1 tablet (4 mg total) by mouth every 6 (six) hours as needed for nausea. 20 tablet 0   pantoprazole  (PROTONIX ) 40 MG tablet Take 1 tablet (40 mg total) by mouth every evening. 90 tablet 1   polyethylene glycol powder (GLYCOLAX /MIRALAX ) 17 GM/SCOOP powder Take 119 g by mouth daily as needed for mild constipation or moderate constipation.     rosuvastatin  (CRESTOR ) 20 MG tablet Take 1 tablet (20 mg total) by mouth daily. (Patient taking differently: Take 20 mg by mouth at bedtime.) 90 tablet 1   SYRINGE-NEEDLE, DISP, 3 ML (B-D 3CC LUER-LOK SYR 21GX1-1/2) 21G X 1-1/2" 3 ML MISC Use to inject testosterone  every week 100 each 2   testosterone  cypionate (DEPOTESTOTERONE CYPIONATE) 100 MG/ML injection Take 50mg  ( 0.54ml) and 100mg  ( 1ml) into muscle alternatively weekly. 10 mL 1   venlafaxine  XR (EFFEXOR -XR) 75 MG 24 hr capsule Take 2 capsules (150 mg total) by mouth daily with breakfast. (Patient taking differently: Take 150 mg by mouth at bedtime.) 180 capsule 2   No current facility-administered medications for this visit.    SURGICAL HISTORY:  Past Surgical History:  Procedure Laterality Date   Bilateral inguinal hernia repair     BRONCHIAL NEEDLE ASPIRATION BIOPSY  04/05/2021   Procedure:  BRONCHIAL NEEDLE ASPIRATION BIOPSIES;  Surgeon: Bradley Brownie, DO;  Location: [facility];  Service: Pulmonary;;   COLONOSCOPY  01/23/2012   Procedure: COLONOSCOPY;  Surgeon: Suzette Espy, MD;  Location: [facility];  Service: Endoscopy;  Laterality: N/A;  9:30 AM   COLONOSCOPY N/A 10/11/2015   Procedure: COLONOSCOPY;  Surgeon: Suzette Espy, MD;  Location: [facility];  Service: Endoscopy;  Laterality: N/A;  830   COLONOSCOPY N/A 10/14/2019   Procedure: COLONOSCOPY;  Surgeon: Suzette Espy, MD;  Location: [facility];  Service: Endoscopy;  Laterality: N/A;  1:45   POLYPECTOMY  10/14/2019   Procedure: POLYPECTOMY;  Surgeon: Suzette Espy, MD;  Location: [facility];  Service: Endoscopy;;  ascending colon, descending colon   VIDEO BRONCHOSCOPY WITH ENDOBRONCHIAL ULTRASOUND N/A 04/05/2021   Procedure: VIDEO BRONCHOSCOPY WITH ENDOBRONCHIAL ULTRASOUND;  Surgeon: Bradley Brownie, DO;  Location: [facility];  Service: Pulmonary;  Laterality: N/A;    REVIEW OF SYSTEMS:   Review of  Systems  Constitutional: Improved fatigue and appetite. Negative for chills, fever and unexpected weight change.  HENT: Negative for mouth sores, nosebleeds, sore throat and trouble swallowing.   Eyes: Negative for eye problems and icterus.  Respiratory: Negative for cough, hemoptysis, shortness of breath and wheezing.   Cardiovascular: Negative for chest pain and leg swelling.  Gastrointestinal: Negative for abdominal pain, constipation, diarrhea, nausea and vomiting.  Genitourinary: Negative for bladder incontinence, difficulty urinating, dysuria, frequency and hematuria.   Musculoskeletal: Negative for back pain, gait problem, neck pain and neck stiffness.  Skin: Negative for itching and rash.  Neurological: Negative for dizziness, extremity weakness, gait problem, headaches, light-headedness and seizures.  Hematological: Negative for adenopathy. Does not bruise/bleed easily.  Psychiatric/Behavioral:  Negative for confusion, depression and sleep disturbance. The patient is not nervous/anxious.     PHYSICAL EXAMINATION:  There were no vitals taken for this visit.  ECOG PERFORMANCE STATUS: 1  Physical Exam  Constitutional: Oriented to person, place, and time and well-developed, well-nourished, and in no distress.  HENT:  Head: Normocephalic and atraumatic.  Mouth/Throat: Positive for poor dentition. Oropharynx is clear and moist. No oropharyngeal exudate.  Eyes: Conjunctivae are normal. Right eye exhibits no discharge. Left eye exhibits no discharge. No scleral icterus.  Neck: Normal range of motion. Neck supple.  Cardiovascular: Normal rate, regular rhythm, normal heart sounds and intact distal pulses.   Pulmonary/Chest: Effort normal and breath sounds normal. No respiratory distress. No wheezes. No rales.  Abdominal: Soft. Bowel sounds are normal. Exhibits no distension and no mass. There is no tenderness.  Musculoskeletal: Normal range of motion. No swelling.  Lymphadenopathy:    No cervical adenopathy.  Neurological: Alert and oriented to person, place, and time. Exhibits normal muscle tone. Gait normal. Coordination normal.  Skin: Positive for upper extremity bruising. Skin is warm and dry. No rash noted. Not diaphoretic. No erythema. No pallor.  Psychiatric: Mood, memory and judgment normal.  Vitals reviewed.  LABORATORY DATA: Lab Results  Component Value Date   WBC 8.7 03/02/2024   HGB 8.2 (L) 03/02/2024   HCT 27.6 (L) 03/02/2024   MCV 83.9 03/02/2024   PLT 234 03/02/2024      Chemistry      Component Value Date/Time   NA 142 03/06/2024 0802   NA 144 11/26/2023 1135   K 3.8 03/06/2024 0802   CL 105 03/06/2024 0802   CO2 27 03/06/2024 0802   BUN 15 03/06/2024 0802   BUN 11 11/26/2023 1135   CREATININE 0.98 03/06/2024 0802   CREATININE 0.98 02/24/2024 1324      Component Value Date/Time   CALCIUM  8.9 03/06/2024 0802   ALKPHOS 71 03/06/2024 0802   AST 49 (H)  03/06/2024 0802   AST 21 02/24/2024 1324   ALT 52 (H) 03/06/2024 0802   ALT 23 02/24/2024 1324   BILITOT 0.3 03/06/2024 0802   BILITOT 0.2 02/24/2024 1324       RADIOGRAPHIC STUDIES:  DG Chest Port 1 View Result Date: 02/29/2024 CLINICAL DATA:  Questionable sepsis - evaluate for abnormality EXAM: PORTABLE CHEST 1 VIEW COMPARISON:  January 27, 2024., January 22, 2024 FINDINGS: The cardiomediastinal silhouette is unchanged in contour. No pleural effusion. No pneumothorax. No acute pleuroparenchymal abnormality. IMPRESSION: No acute cardiopulmonary abnormality. Electronically Signed   By: Clancy Crimes M.D.   On: 02/29/2024 12:59   MR BRAIN W WO CONTRAST Result Date: 02/13/2024 CLINICAL DATA:  Provided history: Malignant neoplasm metastatic to brain. Brain/CNS neoplasm, assess treatment response. EXAM: MRI HEAD  WITHOUT AND WITH CONTRAST TECHNIQUE: Multiplanar, multiecho pulse sequences of the brain and surrounding structures were obtained without and with intravenous contrast. CONTRAST:  8 mL Vueway  intravenous contrast. COMPARISON:  Prior brain MRI examinations 08/15/2023 and earlier. FINDINGS: Brain: No age-advanced or lobar predominant cerebral atrophy. Punctate focus of enhancement within the posterior left frontal lobe, not appreciated on prior examinations and indeterminate for a new metastasis versus vascular enhancement (series 13, image 138). Continued slight interval increase in size of an enhancing lesion within the right parietal lobe, now measuring 3.5 mm (previously 3 mm) (series 13, image 84). Punctate enhancing lesion within the left cerebellar hemisphere, unchanged (series 13, image 46). Moderate for age multifocal T2 FLAIR hyperintense signal abnormality within the cerebral white matter, nonspecific but compatible with chronic small vessel ischemic disease. No cortical encephalomalacia is identified. There is no acute infarct. No chronic intracranial blood products. No extra-axial  fluid collection. No midline shift. Vascular: Maintained flow voids within the proximal large arterial vessels. Skull and upper cervical spine: No focal worrisome marrow lesion. Sinuses/Orbits: No mass or acute finding within the imaged orbits. Mild mucosal thickening within the bilateral ethmoid, right sphenoid and right maxillary sinuses. Other: Small right mastoid effusion. Impression #1 will be called to the ordering clinician or representative by the Radiologist Assistant, and communication documented in the PACS or [employer]. IMPRESSION: 1. Punctate focus of enhancement within the posterior left frontal lobe, not appreciated on prior examinations and indeterminate for a new metastasis versus vascular enhancement. A short-interval follow-up brain MRI (with and without contrast) is recommended for surveillance. 2. Continued slight increase in size of an enhancing lesion within the right parietal lobe, now measuring 3.5 mm (previously 3 mm). 3. Unchanged punctate enhancing lesion within the left cerebellar hemisphere. 4. Moderate cerebral white matter chronic small vessel ischemic disease. 5. Mild paranasal sinus mucosal thickening. 6. Small right mastoid effusion. Electronically Signed   By: Bascom Lily D.O.   On: 02/13/2024 10:12     ASSESSMENT/PLAN:  This is a very pleasant 68 year old Caucasian male diagnosed with stage IV (T1b, N3, M1 C) non-small cell lung cancer, favoring adenocarcinoma.  The patient presented with a right upper lobe lung nodule in addition to right hilar, subcarinal, and bilateral mediastinal lymphadenopathy as well as supraclavicular lymphadenopathy.  The patient also has metastatic disease to the brain.  He was diagnosed in June 2022.  His PD-L1 expression is 80%.  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    The patient completed SRS to the brain lesions under the care of  Dr. Lorri Goodman.  His last treatment was on 05/04/2021. 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     The patient is currently undergoing systemic chemotherapy with carboplatin  for an AUC of 5, Alimta  500 mg per metered squared, Keytruda  200 mg IV every 3 weeks.  Status post 47 cycles. Starting from cycle #5, the patient started maintenance Alimta  and Keytruda  IV every 3 weeks.    He was recently hospitalized. He is feeling much better at this time.    Labs were reviewed.  Recommend that he proceed with cycle #48 today as scheduled.   We will see him back for follow-up visit in 3 weeks for evaluation repeat blood work before undergoing cycle #49.  The patient was advised to call immediately if he has any concerning symptoms in the interval.  The patient voices understanding of current disease status and treatment options and is in agreement with the current care plan. All questions were answered. The patient knows to call the clinic with any problems, questions or concerns. We can certainly see the patient much sooner if necessary    No orders of the defined types were placed in this encounter.    The total time spent in the appointment was 20-29 minutes  Gennifer Potenza L Kennya Schwenn, PA-C 03/11/24

## 2024-03-16 ENCOUNTER — Inpatient Hospital Stay: Attending: Physician Assistant

## 2024-03-16 ENCOUNTER — Inpatient Hospital Stay: Attending: Physician Assistant | Admitting: Physician Assistant

## 2024-03-16 ENCOUNTER — Encounter: Payer: Self-pay | Admitting: Internal Medicine

## 2024-03-16 VITALS — HR 97

## 2024-03-16 VITALS — BP 115/91 | HR 101 | Temp 98.5°F | Resp 16 | Wt 180.2 lb

## 2024-03-16 DIAGNOSIS — E039 Hypothyroidism, unspecified: Secondary | ICD-10-CM | POA: Insufficient documentation

## 2024-03-16 DIAGNOSIS — Z5111 Encounter for antineoplastic chemotherapy: Secondary | ICD-10-CM | POA: Diagnosis not present

## 2024-03-16 DIAGNOSIS — Z7952 Long term (current) use of systemic steroids: Secondary | ICD-10-CM | POA: Diagnosis not present

## 2024-03-16 DIAGNOSIS — Z79890 Hormone replacement therapy (postmenopausal): Secondary | ICD-10-CM | POA: Diagnosis not present

## 2024-03-16 DIAGNOSIS — C3491 Malignant neoplasm of unspecified part of right bronchus or lung: Secondary | ICD-10-CM

## 2024-03-16 DIAGNOSIS — C7931 Secondary malignant neoplasm of brain: Secondary | ICD-10-CM | POA: Diagnosis not present

## 2024-03-16 DIAGNOSIS — Z5112 Encounter for antineoplastic immunotherapy: Secondary | ICD-10-CM | POA: Diagnosis not present

## 2024-03-16 DIAGNOSIS — Z79899 Other long term (current) drug therapy: Secondary | ICD-10-CM | POA: Diagnosis not present

## 2024-03-16 DIAGNOSIS — E291 Testicular hypofunction: Secondary | ICD-10-CM | POA: Diagnosis not present

## 2024-03-16 DIAGNOSIS — C3411 Malignant neoplasm of upper lobe, right bronchus or lung: Secondary | ICD-10-CM | POA: Insufficient documentation

## 2024-03-16 DIAGNOSIS — C7951 Secondary malignant neoplasm of bone: Secondary | ICD-10-CM | POA: Insufficient documentation

## 2024-03-16 LAB — CBC WITH DIFFERENTIAL (CANCER CENTER ONLY)
Abs Immature Granulocytes: 0.07 10*3/uL (ref 0.00–0.07)
Basophils Absolute: 0.1 10*3/uL (ref 0.0–0.1)
Basophils Relative: 1 %
Eosinophils Absolute: 0.1 10*3/uL (ref 0.0–0.5)
Eosinophils Relative: 1 %
HCT: 37.1 % — ABNORMAL LOW (ref 39.0–52.0)
Hemoglobin: 11.1 g/dL — ABNORMAL LOW (ref 13.0–17.0)
Immature Granulocytes: 1 %
Lymphocytes Relative: 13 %
Lymphs Abs: 1.3 10*3/uL (ref 0.7–4.0)
MCH: 25.4 pg — ABNORMAL LOW (ref 26.0–34.0)
MCHC: 29.9 g/dL — ABNORMAL LOW (ref 30.0–36.0)
MCV: 84.9 fL (ref 80.0–100.0)
Monocytes Absolute: 0.6 10*3/uL (ref 0.1–1.0)
Monocytes Relative: 6 %
Neutro Abs: 8 10*3/uL — ABNORMAL HIGH (ref 1.7–7.7)
Neutrophils Relative %: 78 %
Platelet Count: 395 10*3/uL (ref 150–400)
RBC: 4.37 MIL/uL (ref 4.22–5.81)
RDW: 21.2 % — ABNORMAL HIGH (ref 11.5–15.5)
WBC Count: 10.1 10*3/uL (ref 4.0–10.5)
nRBC: 0 % (ref 0.0–0.2)

## 2024-03-16 LAB — CMP (CANCER CENTER ONLY)
ALT: 18 U/L (ref 0–44)
AST: 18 U/L (ref 15–41)
Albumin: 3.9 g/dL (ref 3.5–5.0)
Alkaline Phosphatase: 68 U/L (ref 38–126)
Anion gap: 8 (ref 5–15)
BUN: 21 mg/dL (ref 8–23)
CO2: 29 mmol/L (ref 22–32)
Calcium: 9.1 mg/dL (ref 8.9–10.3)
Chloride: 105 mmol/L (ref 98–111)
Creatinine: 0.92 mg/dL (ref 0.61–1.24)
GFR, Estimated: >60 mL/min (ref >60)
Glucose, Bld: 137 mg/dL — ABNORMAL HIGH (ref 70–99)
Potassium: 3.8 mmol/L (ref 3.5–5.1)
Sodium: 142 mmol/L (ref 135–145)
Total Bilirubin: 0.2 mg/dL (ref 0.0–1.2)
Total Protein: 6.6 g/dL (ref 6.5–8.1)

## 2024-03-16 MED ORDER — PROCHLORPERAZINE MALEATE 10 MG PO TABS
10.0000 mg | ORAL_TABLET | Freq: Once | ORAL | Status: AC
  Administered 2024-03-16: 10 mg via ORAL
  Filled 2024-03-16: qty 1

## 2024-03-16 MED ORDER — SODIUM CHLORIDE 0.9 % IV SOLN
200.0000 mg | Freq: Once | INTRAVENOUS | Status: AC
  Administered 2024-03-16: 200 mg via INTRAVENOUS
  Filled 2024-03-16: qty 200

## 2024-03-16 MED ORDER — SODIUM CHLORIDE 0.9 % IV SOLN: Freq: Once | INTRAVENOUS | Status: AC

## 2024-03-16 MED ORDER — SODIUM CHLORIDE 0.9 % IV SOLN
500.0000 mg/m2 | Freq: Once | INTRAVENOUS | Status: AC
  Administered 2024-03-16: 1000 mg via INTRAVENOUS
  Filled 2024-03-16: qty 40

## 2024-03-16 NOTE — Patient Instructions (Signed)
 [facility] - A DEPT OF [facility]  Discharge Instructions: Thank you for choosing [facility] to provide your oncology and hematology care.   If you have a lab appointment with the Cancer Center, please go directly to the Cancer Center and check in at the registration area.   Wear comfortable clothing and clothing appropriate for easy access to any Portacath or PICC line.   We strive to give you quality time with your provider. You may need to reschedule your appointment if you arrive late (15 or more minutes).  Arriving late affects you and other patients whose appointments are after yours.  Also, if you miss three or more appointments without notifying the office, you may be dismissed from the clinic at the provider's discretion.      For prescription refill requests, have your pharmacy contact our office and allow 72 hours for refills to be completed.    Today you received the following chemotherapy and/or immunotherapy agents: pembrolizumab and pemetrexed      To help prevent nausea and vomiting after your treatment, we encourage you to take your nausea medication as directed.  BELOW ARE SYMPTOMS THAT SHOULD BE REPORTED IMMEDIATELY: *FEVER GREATER THAN 100.4 F (38 C) OR HIGHER *CHILLS OR SWEATING *NAUSEA AND VOMITING THAT IS NOT CONTROLLED WITH YOUR NAUSEA MEDICATION *UNUSUAL SHORTNESS OF BREATH *UNUSUAL BRUISING OR BLEEDING *URINARY PROBLEMS (pain or burning when urinating, or frequent urination) *BOWEL PROBLEMS (unusual diarrhea, constipation, pain near the anus) TENDERNESS IN MOUTH AND THROAT WITH OR WITHOUT PRESENCE OF ULCERS (sore throat, sores in mouth, or a toothache) UNUSUAL RASH, SWELLING OR PAIN  UNUSUAL VAGINAL DISCHARGE OR ITCHING   Items with * indicate a potential emergency and should be followed up as soon as possible or go to the Emergency Department if any problems should occur.  Please show the CHEMOTHERAPY ALERT  CARD or IMMUNOTHERAPY ALERT CARD at check-in to the Emergency Department and triage nurse.  Should you have questions after your visit or need to cancel or reschedule your appointment, please contact [facility] - A DEPT OF [facility]  Dept: [phone]  and follow the prompts.  Office hours are 8:00 a.m. to 4:30 p.m. Monday - Friday. Please note that voicemails left after 4:00 p.m. may not be returned until the following business day.  We are closed weekends and major holidays. You have access to a nurse at all times for urgent questions. Please call the main number to the clinic Dept: [phone] and follow the prompts.   For any non-urgent questions, you may also contact your provider using MyChart. We now offer e-Visits for anyone 75 and older to request care online for non-urgent symptoms. For details visit [email or web address].   Also download the MyChart app! Go to the app store, search "MyChart", open the app, select [place], and log in with your MyChart username and password.

## 2024-03-18 ENCOUNTER — Encounter: Payer: Self-pay | Admitting: Internal Medicine

## 2024-03-23 ENCOUNTER — Other Ambulatory Visit: Payer: Self-pay

## 2024-04-02 ENCOUNTER — Other Ambulatory Visit: Payer: Self-pay | Admitting: Family Medicine

## 2024-04-02 DIAGNOSIS — R630 Anorexia: Secondary | ICD-10-CM

## 2024-04-03 ENCOUNTER — Other Ambulatory Visit: Payer: Self-pay

## 2024-04-03 ENCOUNTER — Other Ambulatory Visit (HOSPITAL_COMMUNITY): Payer: Self-pay

## 2024-04-03 ENCOUNTER — Other Ambulatory Visit: Payer: Self-pay | Admitting: Family Medicine

## 2024-04-03 DIAGNOSIS — I152 Hypertension secondary to endocrine disorders: Secondary | ICD-10-CM

## 2024-04-03 MED ORDER — FOLIC ACID 1 MG PO TABS
1.0000 mg | ORAL_TABLET | Freq: Every day | ORAL | 0 refills | Status: DC
Start: 2024-04-03 — End: 2024-07-16
  Filled 2024-04-03: qty 90, 90d supply, fill #0

## 2024-04-03 MED ORDER — LOSARTAN POTASSIUM 50 MG PO TABS
75.0000 mg | ORAL_TABLET | Freq: Every day | ORAL | 0 refills | Status: DC
  Filled 2024-04-03 – 2024-04-08 (×2): qty 135, 90d supply, fill #0

## 2024-04-06 ENCOUNTER — Other Ambulatory Visit: Payer: Self-pay

## 2024-04-06 ENCOUNTER — Inpatient Hospital Stay

## 2024-04-06 ENCOUNTER — Inpatient Hospital Stay (HOSPITAL_BASED_OUTPATIENT_CLINIC_OR_DEPARTMENT_OTHER): Admitting: Internal Medicine

## 2024-04-06 VITALS — BP 148/83 | HR 90 | Temp 97.6°F | Resp 18 | Ht 71.0 in | Wt 182.4 lb

## 2024-04-06 DIAGNOSIS — C3411 Malignant neoplasm of upper lobe, right bronchus or lung: Secondary | ICD-10-CM | POA: Diagnosis not present

## 2024-04-06 DIAGNOSIS — C7951 Secondary malignant neoplasm of bone: Secondary | ICD-10-CM | POA: Diagnosis not present

## 2024-04-06 DIAGNOSIS — C3490 Malignant neoplasm of unspecified part of unspecified bronchus or lung: Secondary | ICD-10-CM

## 2024-04-06 DIAGNOSIS — C3491 Malignant neoplasm of unspecified part of right bronchus or lung: Secondary | ICD-10-CM

## 2024-04-06 DIAGNOSIS — Z5112 Encounter for antineoplastic immunotherapy: Secondary | ICD-10-CM | POA: Diagnosis not present

## 2024-04-06 DIAGNOSIS — Z7952 Long term (current) use of systemic steroids: Secondary | ICD-10-CM | POA: Diagnosis not present

## 2024-04-06 DIAGNOSIS — E039 Hypothyroidism, unspecified: Secondary | ICD-10-CM | POA: Diagnosis not present

## 2024-04-06 DIAGNOSIS — Z79890 Hormone replacement therapy (postmenopausal): Secondary | ICD-10-CM | POA: Diagnosis not present

## 2024-04-06 DIAGNOSIS — Z5111 Encounter for antineoplastic chemotherapy: Secondary | ICD-10-CM | POA: Diagnosis not present

## 2024-04-06 DIAGNOSIS — C7931 Secondary malignant neoplasm of brain: Secondary | ICD-10-CM | POA: Diagnosis not present

## 2024-04-06 DIAGNOSIS — E291 Testicular hypofunction: Secondary | ICD-10-CM | POA: Diagnosis not present

## 2024-04-06 DIAGNOSIS — Z79899 Other long term (current) drug therapy: Secondary | ICD-10-CM | POA: Diagnosis not present

## 2024-04-06 LAB — CBC WITH DIFFERENTIAL (CANCER CENTER ONLY)
Abs Immature Granulocytes: 0.07 10*3/uL (ref 0.00–0.07)
Basophils Absolute: 0.1 10*3/uL (ref 0.0–0.1)
Basophils Relative: 1 %
Eosinophils Absolute: 0.1 10*3/uL (ref 0.0–0.5)
Eosinophils Relative: 1 %
HCT: 40 % (ref 39.0–52.0)
Hemoglobin: 12.1 g/dL — ABNORMAL LOW (ref 13.0–17.0)
Immature Granulocytes: 1 %
Lymphocytes Relative: 20 %
Lymphs Abs: 2 10*3/uL (ref 0.7–4.0)
MCH: 25.7 pg — ABNORMAL LOW (ref 26.0–34.0)
MCHC: 30.3 g/dL (ref 30.0–36.0)
MCV: 85.1 fL (ref 80.0–100.0)
Monocytes Absolute: 0.7 10*3/uL (ref 0.1–1.0)
Monocytes Relative: 7 %
Neutro Abs: 6.8 10*3/uL (ref 1.7–7.7)
Neutrophils Relative %: 70 %
Platelet Count: 331 10*3/uL (ref 150–400)
RBC: 4.7 MIL/uL (ref 4.22–5.81)
RDW: 20.7 % — ABNORMAL HIGH (ref 11.5–15.5)
WBC Count: 9.7 10*3/uL (ref 4.0–10.5)
nRBC: 0 % (ref 0.0–0.2)

## 2024-04-06 LAB — CMP (CANCER CENTER ONLY)
ALT: 38 U/L (ref 0–44)
AST: 31 U/L (ref 15–41)
Albumin: 4.2 g/dL (ref 3.5–5.0)
Alkaline Phosphatase: 80 U/L (ref 38–126)
Anion gap: 11 (ref 5–15)
BUN: 14 mg/dL (ref 8–23)
CO2: 27 mmol/L (ref 22–32)
Calcium: 9.4 mg/dL (ref 8.9–10.3)
Chloride: 106 mmol/L (ref 98–111)
Creatinine: 0.93 mg/dL (ref 0.61–1.24)
GFR, Estimated: >60 mL/min (ref >60)
Glucose, Bld: 96 mg/dL (ref 70–99)
Potassium: 4.1 mmol/L (ref 3.5–5.1)
Sodium: 144 mmol/L (ref 135–145)
Total Bilirubin: 0.3 mg/dL (ref 0.0–1.2)
Total Protein: 7.2 g/dL (ref 6.5–8.1)

## 2024-04-06 MED ORDER — SODIUM CHLORIDE 0.9 % IV SOLN
200.0000 mg | Freq: Once | INTRAVENOUS | Status: AC
  Administered 2024-04-06: 200 mg via INTRAVENOUS
  Filled 2024-04-06: qty 200

## 2024-04-06 MED ORDER — SODIUM CHLORIDE 0.9 % IV SOLN: Freq: Once | INTRAVENOUS | Status: AC

## 2024-04-06 MED ORDER — SODIUM CHLORIDE 0.9 % IV SOLN
500.0000 mg/m2 | Freq: Once | INTRAVENOUS | Status: AC
  Administered 2024-04-06: 1000 mg via INTRAVENOUS
  Filled 2024-04-06: qty 40

## 2024-04-06 MED ORDER — PROCHLORPERAZINE MALEATE 10 MG PO TABS
10.0000 mg | ORAL_TABLET | Freq: Once | ORAL | Status: AC
  Administered 2024-04-06: 10 mg via ORAL
  Filled 2024-04-06: qty 1

## 2024-04-06 NOTE — Progress Notes (Signed)
 [facility] [phone]   [phone]  OFFICE PROGRESS NOTE  Bradley Goodman HERO, FNP [address] [place] [address]  DIAGNOSIS: Stage IV (T1b, N3, M1C) non-small cell lung cancer, favoring adenocarcinoma presented with right upper lobe lung nodule in addition to right hilar, subcarinal and bilateral mediastinal as well as supraclavicular lymphadenopathy in addition to bone and brain metastasis diagnosed in June 2022.     PD-L1 expression 80%.     Molecular Studies:  Biomarker Findings Microsatellite status - MS-Stable Tumor Mutational Burden - 6 Muts/Mb Genomic Findings For a complete list of the genes assayed, please refer to the Appendix. KRAS G12C, amplification ATM S470* CCND1 amplification - equivocal? HGF amplification - equivocal? MYC amplification - equivocal? FGF19 amplification - equivocal? FGF3 amplification - equivocal? FGF4 amplification - equivocal? NFKBIA amplification NKX2-1 amplification RAD21 amplification - equivocal? [id number]*26 TERT promoter -124C>T TP53 rearrangement exon 9 7 Disease relevant genes with no reportable alterations: ALK, BRAF, EGFR, ERBB2, MET, RET, ROS1   PRIOR THERAPY: SRS to the metastatic brain lesions under the care of Dr. Patrcia.  Last treatment on 05/04/2021.   CURRENT THERAPY: Palliative systemic chemotherapy with carboplatin  for an AUC 5, Alimta  500 mg/m2 and, Keytruda  200 mg IV every 3 weeks.  First dose on 05/08/2021.  Status post 49 cycles.  Starting from cycle #5 he is on maintenance treatment with Alimta  and Keytruda  every 3 weeks.  INTERVAL HISTORY: Bradley Goodman 68 y.o. male returns to the clinic today for follow-up visit accompanied by his wife.Discussed the use of AI scribe software for clinical note transcription with the patient, who gave verbal consent to proceed.  History of Present Illness   Bradley Goodman is a 68 year old male with stage four non-small cell  lung cancer who presents for evaluation before starting cycle 50 of his treatment. He is accompanied by his wife.  He was diagnosed with stage four non-small cell lung cancer, adenocarcinoma, in June 2022, characterized by a positive KRAS G12C mutation and PD-L1 expression of 80%. Initially, he underwent palliative systemic chemotherapy with four cycles of carboplatin , Alimta , and Keytruda . Since then, he has been on maintenance treatment with Alimta  and Keytruda  every three weeks, currently completing a total of 49 cycles.  He has no new complaints since his last hospital visit. He was hospitalized for four to five days due to SIRS, which did not require ICU admission. He mentions that the hospital staff was familiar with him, which may have influenced his care.  There was a medication discrepancy noted; he was prescribed 50 mg of a medication but should be taking 150 mg.        MEDICAL HISTORY: 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   PONV (postoperative nausea and vomiting)    Type 2 diabetes mellitus (HCC)     ALLERGIES:  has no known allergies.  MEDICATIONS:  Current Outpatient Medications  Medication Sig Dispense Refill   acetaminophen  (TYLENOL ) 500 MG tablet Take 1,000 mg by mouth every 6 (six) hours as needed for mild pain (pain score 1-3) or fever.     chlorproMAZINE  (THORAZINE ) 10 MG tablet Take 1 tablet (10 mg total) by mouth 3 (three)  times daily as needed for hiccoughs. 90 tablet 0   cholecalciferol  (VITAMIN D ) 1000 UNITS tablet Take 1,000 Units by mouth every evening.     Continuous Glucose Sensor (FREESTYLE LIBRE 3 PLUS SENSOR) MISC Change sensor every 15 days. 1 each 11   dexamethasone  (DECADRON ) 1 MG tablet Take 1 tablet BID X 3 days; then resume 1 tablet  daily.     folic acid  (FOLVITE ) 1 MG tablet Take 1 tablet (1 mg total) by mouth daily. 90 tablet 0   Krill Oil 300 MG CAPS Take 300 mg by mouth every evening.     levothyroxine  (SYNTHROID ) 100 MCG tablet Take 1 tablet (100 mcg total) by mouth daily before breakfast. 90 tablet 1   loratadine  (CLARITIN ) 10 MG tablet Take 10 mg by mouth every evening.      losartan  (COZAAR ) 50 MG tablet Take 1.5 tablets (75 mg total) by mouth daily. 135 tablet 0   mirtazapine  (REMERON ) 15 MG tablet Take 1 tablet (15 mg total) by mouth at bedtime. 90 tablet 1   Multiple Vitamins-Minerals (MULTIVITAMIN GUMMIES ADULT PO) Take 2 tablets by mouth every evening.     ondansetron  (ZOFRAN ) 4 MG tablet Take 1 tablet (4 mg total) by mouth every 6 (six) hours as needed for nausea. 20 tablet 0   pantoprazole  (PROTONIX ) 40 MG tablet Take 1 tablet (40 mg total) by mouth every evening. 90 tablet 1   polyethylene glycol powder (GLYCOLAX /MIRALAX ) 17 GM/SCOOP powder Take 119 g by mouth daily as needed for mild constipation or moderate constipation.     rosuvastatin  (CRESTOR ) 20 MG tablet Take 1 tablet (20 mg total) by mouth daily. (Patient taking differently: Take 20 mg by mouth at bedtime.) 90 tablet 1   SYRINGE-NEEDLE, DISP, 3 ML (B-D 3CC LUER-LOK SYR 21GX1-1/2) 21G X 1-1/2 3 ML MISC Use to inject testosterone  every week 100 each 2   testosterone  cypionate (DEPOTESTOTERONE CYPIONATE) 100 MG/ML injection Take 50mg  ( 0.67ml) and 100mg  ( 1ml) into muscle alternatively weekly. 10 mL 1   venlafaxine  XR (EFFEXOR -XR) 75 MG 24 hr capsule Take 2 capsules (150 mg total) by mouth daily with breakfast. (Patient taking differently: Take 150 mg by mouth at bedtime.) 180 capsule 2   No current facility-administered medications for this visit.    SURGICAL HISTORY:  Past Surgical History:  Procedure Laterality Date   Bilateral inguinal hernia repair     BRONCHIAL NEEDLE ASPIRATION BIOPSY  04/05/2021   Procedure: BRONCHIAL NEEDLE ASPIRATION  BIOPSIES;  Surgeon: Brenna Adine CROME, DO;  Location: [facility];  Service: Pulmonary;;   COLONOSCOPY  01/23/2012   Procedure: COLONOSCOPY;  Surgeon: Lamar CHRISTELLA Hollingshead, MD;  Location: [facility];  Service: Endoscopy;  Laterality: N/A;  9:30 AM   COLONOSCOPY N/A 10/11/2015   Procedure: COLONOSCOPY;  Surgeon: Lamar CHRISTELLA Hollingshead, MD;  Location: [facility];  Service: Endoscopy;  Laterality: N/A;  830   COLONOSCOPY N/A 10/14/2019   Procedure: COLONOSCOPY;  Surgeon: Hollingshead Lamar CHRISTELLA, MD;  Location: [facility];  Service: Endoscopy;  Laterality: N/A;  1:45   POLYPECTOMY  10/14/2019   Procedure: POLYPECTOMY;  Surgeon: Hollingshead Lamar CHRISTELLA, MD;  Location: [facility];  Service: Endoscopy;;  ascending colon, descending colon   VIDEO BRONCHOSCOPY WITH ENDOBRONCHIAL ULTRASOUND N/A 04/05/2021   Procedure: VIDEO BRONCHOSCOPY WITH ENDOBRONCHIAL ULTRASOUND;  Surgeon: Brenna Adine CROME, DO;  Location: [facility];  Service: Pulmonary;  Laterality: N/A;    REVIEW OF SYSTEMS:  A comprehensive review of systems was negative.  PHYSICAL EXAMINATION: General appearance: alert, cooperative, appears stated age, fatigued, and no distress Head: Normocephalic, without obvious abnormality, atraumatic Neck: no adenopathy, no JVD, supple, symmetrical, trachea midline, and thyroid  not enlarged, symmetric, no tenderness/mass/nodules Lymph nodes: Cervical, supraclavicular, and axillary nodes normal. Resp: clear to auscultation bilaterally Back: symmetric, no curvature. ROM normal. No CVA tenderness. Cardio: regular rate and rhythm, S1, S2 normal, no murmur, click, rub or gallop GI: soft, non-tender; bowel sounds normal; no masses,  no organomegaly Extremities: extremities normal, atraumatic, no cyanosis or edema  ECOG PERFORMANCE STATUS: 1 - Symptomatic but completely ambulatory  Blood pressure (!) 148/83, pulse 90, temperature 97.6 F (36.4 C), temperature source Temporal, resp. rate 18, height 5' 11 (1.803 m), weight 182 lb 6.4  oz (82.7 kg), SpO2 99%.  LABORATORY DATA: Lab Results  Component Value Date   WBC 10.1 03/16/2024   HGB 11.1 (L) 03/16/2024   HCT 37.1 (L) 03/16/2024   MCV 84.9 03/16/2024   PLT 395 03/16/2024      Chemistry      Component Value Date/Time   NA 142 03/16/2024 1329   NA 144 11/26/2023 1135   K 3.8 03/16/2024 1329   CL 105 03/16/2024 1329   CO2 29 03/16/2024 1329   BUN 21 03/16/2024 1329   BUN 11 11/26/2023 1135   CREATININE 0.92 03/16/2024 1329      Component Value Date/Time   CALCIUM  9.1 03/16/2024 1329   ALKPHOS 68 03/16/2024 1329   AST 18 03/16/2024 1329   ALT 18 03/16/2024 1329   BILITOT 0.2 03/16/2024 1329       RADIOGRAPHIC STUDIES: No results found.    ASSESSMENT AND PLAN: This is a very pleasant 68 years old white male recently diagnosed with stage IV (T1b, N3, M1 C) non-small cell lung cancer favoring adenocarcinoma presented with right upper lobe lung nodule in addition to right hilar, subcarinal and bilateral mediastinal as well as supraclavicular lymphadenopathy.  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  His PD-L1 expression is 80% and his molecular studies showed KRAS G12C mutation. The patient underwent SRS to metastatic brain lesion under the care of Dr. Patrcia and he is currently undergoing systemic chemotherapy with carboplatin  for AUC of 5, Alimta  500 Mg/M2 and Keytruda  200 Mg IV every 3 weeks status post 49 cycles.  Starting from cycle #5 the patient will be treated with maintenance treatment with Alimta  and Keytruda  every 3 weeks.  The patient continues to tolerate this treatment fairly well. Assessment and Plan    Stage 4 non-small cell lung cancer, adenocarcinoma subtype Stage 4 non-small cell lung cancer, adenocarcinoma subtype, with KRAS G12C mutation and PD-L1 expression of 80%. Diagnosed in June 2022. Currently on maintenance therapy with Alimta  and Keytruda , having completed 49 cycles. No new complaints or symptoms  reported. Recent hospitalization for an SIRS, resolved without ICU admission. Blood work is adequate for continuation of treatment. - Administer cycle 50 of Alimta  and Keytruda . - Order follow-up scan after cycle 50 to assess treatment response. - Schedule next appointment in three weeks. - Ensure scan is performed one week prior to the next appointment.   He was advised to call immediately if he has any other concerning symptoms in the interval. The patient voices understanding of current disease status and treatment options and is in agreement with the current care plan.  All questions were answered. The patient knows to call the clinic with any problems, questions or concerns. We can certainly see  the patient much sooner if necessary. The total time spent in the appointment was 20 minutes.  Disclaimer: This note was dictated with voice recognition software. Similar sounding words can inadvertently be transcribed and may not be corrected upon review.

## 2024-04-06 NOTE — Patient Instructions (Signed)
 [facility] - A DEPT OF [facility]  Discharge Instructions: Thank you for choosing [facility] to provide your oncology and hematology care.   If you have a lab appointment with the Cancer Center, please go directly to the Cancer Center and check in at the registration area.   Wear comfortable clothing and clothing appropriate for easy access to any Portacath or PICC line.   We strive to give you quality time with your provider. You may need to reschedule your appointment if you arrive late (15 or more minutes).  Arriving late affects you and other patients whose appointments are after yours.  Also, if you miss three or more appointments without notifying the office, you may be dismissed from the clinic at the provider's discretion.      For prescription refill requests, have your pharmacy contact our office and allow 72 hours for refills to be completed.    Today you received the following chemotherapy and/or immunotherapy agents: pembrolizumab and pemetrexed      To help prevent nausea and vomiting after your treatment, we encourage you to take your nausea medication as directed.  BELOW ARE SYMPTOMS THAT SHOULD BE REPORTED IMMEDIATELY: *FEVER GREATER THAN 100.4 F (38 C) OR HIGHER *CHILLS OR SWEATING *NAUSEA AND VOMITING THAT IS NOT CONTROLLED WITH YOUR NAUSEA MEDICATION *UNUSUAL SHORTNESS OF BREATH *UNUSUAL BRUISING OR BLEEDING *URINARY PROBLEMS (pain or burning when urinating, or frequent urination) *BOWEL PROBLEMS (unusual diarrhea, constipation, pain near the anus) TENDERNESS IN MOUTH AND THROAT WITH OR WITHOUT PRESENCE OF ULCERS (sore throat, sores in mouth, or a toothache) UNUSUAL RASH, SWELLING OR PAIN  UNUSUAL VAGINAL DISCHARGE OR ITCHING   Items with * indicate a potential emergency and should be followed up as soon as possible or go to the Emergency Department if any problems should occur.  Please show the CHEMOTHERAPY ALERT  CARD or IMMUNOTHERAPY ALERT CARD at check-in to the Emergency Department and triage nurse.  Should you have questions after your visit or need to cancel or reschedule your appointment, please contact [facility] - A DEPT OF [facility]  Dept: [phone]  and follow the prompts.  Office hours are 8:00 a.m. to 4:30 p.m. Monday - Friday. Please note that voicemails left after 4:00 p.m. may not be returned until the following business day.  We are closed weekends and major holidays. You have access to a nurse at all times for urgent questions. Please call the main number to the clinic Dept: [phone] and follow the prompts.   For any non-urgent questions, you may also contact your provider using MyChart. We now offer e-Visits for anyone 75 and older to request care online for non-urgent symptoms. For details visit [email or web address].   Also download the MyChart app! Go to the app store, search "MyChart", open the app, select [place], and log in with your MyChart username and password.

## 2024-04-07 ENCOUNTER — Other Ambulatory Visit

## 2024-04-07 ENCOUNTER — Other Ambulatory Visit: Payer: Self-pay

## 2024-04-07 ENCOUNTER — Ambulatory Visit

## 2024-04-07 ENCOUNTER — Ambulatory Visit: Admitting: Internal Medicine

## 2024-04-08 ENCOUNTER — Other Ambulatory Visit: Payer: Self-pay

## 2024-04-08 ENCOUNTER — Other Ambulatory Visit (HOSPITAL_COMMUNITY): Payer: Self-pay

## 2024-04-09 ENCOUNTER — Other Ambulatory Visit: Payer: Self-pay

## 2024-04-14 ENCOUNTER — Other Ambulatory Visit: Payer: Self-pay

## 2024-04-14 ENCOUNTER — Other Ambulatory Visit (HOSPITAL_COMMUNITY): Payer: Self-pay

## 2024-04-14 ENCOUNTER — Other Ambulatory Visit: Payer: Self-pay | Admitting: Family Medicine

## 2024-04-14 DIAGNOSIS — K219 Gastro-esophageal reflux disease without esophagitis: Secondary | ICD-10-CM

## 2024-04-14 DIAGNOSIS — E1169 Type 2 diabetes mellitus with other specified complication: Secondary | ICD-10-CM

## 2024-04-14 MED ORDER — PANTOPRAZOLE SODIUM 40 MG PO TBEC
40.0000 mg | DELAYED_RELEASE_TABLET | Freq: Every evening | ORAL | 0 refills | Status: DC
  Filled 2024-04-14: qty 90, 90d supply, fill #0

## 2024-04-14 MED ORDER — ROSUVASTATIN CALCIUM 20 MG PO TABS
20.0000 mg | ORAL_TABLET | Freq: Every day | ORAL | 0 refills | Status: DC
  Filled 2024-04-14: qty 90, 90d supply, fill #0

## 2024-04-20 ENCOUNTER — Ambulatory Visit (HOSPITAL_COMMUNITY)
Admission: RE | Admit: 2024-04-20 | Discharge: 2024-04-20 | Disposition: A | Discharge status: Home / Self Care | Source: Ambulatory Visit | Attending: Internal Medicine | Admitting: Internal Medicine

## 2024-04-20 DIAGNOSIS — I700 Atherosclerosis of aorta: Secondary | ICD-10-CM | POA: Diagnosis not present

## 2024-04-20 DIAGNOSIS — J439 Emphysema, unspecified: Secondary | ICD-10-CM | POA: Diagnosis not present

## 2024-04-20 DIAGNOSIS — C3490 Malignant neoplasm of unspecified part of unspecified bronchus or lung: Secondary | ICD-10-CM | POA: Diagnosis not present

## 2024-04-20 DIAGNOSIS — K760 Fatty (change of) liver, not elsewhere classified: Secondary | ICD-10-CM | POA: Diagnosis not present

## 2024-04-20 MED ORDER — IOHEXOL 300 MG/ML  SOLN
100.0000 mL | Freq: Once | INTRAMUSCULAR | Status: AC | PRN
  Administered 2024-04-20: 100 mL via INTRAVENOUS

## 2024-04-22 ENCOUNTER — Encounter: Payer: Self-pay | Admitting: Family Medicine

## 2024-04-22 ENCOUNTER — Other Ambulatory Visit: Payer: Self-pay

## 2024-04-22 ENCOUNTER — Ambulatory Visit (INDEPENDENT_AMBULATORY_CARE_PROVIDER_SITE_OTHER): Admitting: Family Medicine

## 2024-04-22 ENCOUNTER — Other Ambulatory Visit (HOSPITAL_COMMUNITY): Payer: Self-pay

## 2024-04-22 VITALS — BP 132/76 | HR 98 | Temp 97.4°F | Ht 71.0 in | Wt 184.6 lb

## 2024-04-22 DIAGNOSIS — E785 Hyperlipidemia, unspecified: Secondary | ICD-10-CM

## 2024-04-22 DIAGNOSIS — E1165 Type 2 diabetes mellitus with hyperglycemia: Secondary | ICD-10-CM

## 2024-04-22 DIAGNOSIS — C7931 Secondary malignant neoplasm of brain: Secondary | ICD-10-CM

## 2024-04-22 DIAGNOSIS — F3340 Major depressive disorder, recurrent, in remission, unspecified: Secondary | ICD-10-CM

## 2024-04-22 DIAGNOSIS — I152 Hypertension secondary to endocrine disorders: Secondary | ICD-10-CM | POA: Diagnosis not present

## 2024-04-22 DIAGNOSIS — R351 Nocturia: Secondary | ICD-10-CM

## 2024-04-22 DIAGNOSIS — F339 Major depressive disorder, recurrent, unspecified: Secondary | ICD-10-CM | POA: Diagnosis not present

## 2024-04-22 DIAGNOSIS — R630 Anorexia: Secondary | ICD-10-CM | POA: Diagnosis not present

## 2024-04-22 DIAGNOSIS — E1169 Type 2 diabetes mellitus with other specified complication: Secondary | ICD-10-CM

## 2024-04-22 DIAGNOSIS — E1159 Type 2 diabetes mellitus with other circulatory complications: Secondary | ICD-10-CM | POA: Diagnosis not present

## 2024-04-22 DIAGNOSIS — E559 Vitamin D deficiency, unspecified: Secondary | ICD-10-CM | POA: Diagnosis not present

## 2024-04-22 DIAGNOSIS — G479 Sleep disorder, unspecified: Secondary | ICD-10-CM | POA: Insufficient documentation

## 2024-04-22 LAB — LIPID PANEL

## 2024-04-22 MED ORDER — MIRTAZAPINE 15 MG PO TABS
15.0000 mg | ORAL_TABLET | Freq: Every day | ORAL | 1 refills | Status: DC
  Filled 2024-04-22: qty 90, 90d supply, fill #0
  Filled 2024-07-21: qty 90, 90d supply, fill #1

## 2024-04-22 MED ORDER — VENLAFAXINE HCL ER 75 MG PO CP24
150.0000 mg | ORAL_CAPSULE | Freq: Every day | ORAL | 2 refills | Status: DC
  Filled 2024-04-22: qty 180, 90d supply, fill #0
  Filled 2024-07-21: qty 180, 90d supply, fill #1
  Filled 2024-10-19: qty 180, 90d supply, fill #2

## 2024-04-22 NOTE — Progress Notes (Signed)
 Subjective:  Patient ID: Bradley Goodman, male    DOB: 1956-08-28, 68 y.o.   MRN: [id number]  Patient Care Team: [name], FNP as PCP - General (Family Medicine) Shaaron Lamar HERO, MD as Consulting Physician (Gastroenterology) Vicci Mcardle, OD (Optometry)   Chief Complaint:  Diabetes (3 month follow up )   HPI: Bradley Goodman is a 68 y.o. male presenting on 04/22/2024 for Diabetes (3 month follow up )   Bradley Goodman is a 68 year old male who presents for follow-up of his diabetes management and recent CT scan results. He is accompanied by his wife.  Blood sugars are generally well-controlled with occasional spikes due to dietary habits. No increased hunger, thirst, or urination. He is adherent to his medication regimen, including Remeron  for sleep and Effexor  for mood, both of which are effective.  He recently underwent a CT scan on Monday, but the results are pending. There is concern about a potential new finding, causing anxiety for his wife.  His blood pressure was elevated today, though he reports stability at home. No headaches, chest pain, or leg swelling, but he notes swelling in his hands.  He is on cholesterol medication without significant muscle aches or pains. No numbness, tingling, or sores on his feet.  He experienced a skin tear yesterday. He takes vitamin D  daily without issues. He typically wakes up no more than once a night to use the bathroom, and sometimes not at all in recent weeks.          Relevant past medical, surgical, family, and social history reviewed and updated as indicated.  Allergies and medications reviewed and updated. Data reviewed: Chart in Epic.   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   PONV (postoperative  nausea and vomiting)    Type 2 diabetes mellitus (HCC)     Past Surgical History:  Procedure Laterality Date   Bilateral inguinal hernia repair     BRONCHIAL NEEDLE ASPIRATION BIOPSY  04/05/2021   Procedure: BRONCHIAL NEEDLE ASPIRATION BIOPSIES;  Surgeon: Brenna Adine CROME, DO;  Location: [facility];  Service: Pulmonary;;   COLONOSCOPY  01/23/2012   Procedure: COLONOSCOPY;  Surgeon: Lamar HERO Shaaron, MD;  Location: [facility];  Service: Endoscopy;  Laterality: N/A;  9:30 AM   COLONOSCOPY N/A 10/11/2015   Procedure: COLONOSCOPY;  Surgeon: Lamar HERO Shaaron, MD;  Location: [facility];  Service: Endoscopy;  Laterality: N/A;  830   COLONOSCOPY N/A 10/14/2019   Procedure: COLONOSCOPY;  Surgeon: Shaaron Lamar HERO, MD;  Location: [facility];  Service: Endoscopy;  Laterality: N/A;  1:45   POLYPECTOMY  10/14/2019   Procedure: POLYPECTOMY;  Surgeon: Shaaron Lamar HERO, MD;  Location: [facility];  Service: Endoscopy;;  ascending colon, descending colon   VIDEO BRONCHOSCOPY WITH ENDOBRONCHIAL ULTRASOUND N/A 04/05/2021   Procedure: VIDEO BRONCHOSCOPY WITH ENDOBRONCHIAL ULTRASOUND;  Surgeon: Brenna Adine CROME, DO;  Location: [facility];  Service: Pulmonary;  Laterality: N/A;    Social History   Socioeconomic History   Marital status: Married    Spouse name: Not on file   Number of children: Not on file   Years of education: Not on file   Highest education level:  Bachelor's degree (e.g., BA, AB, BS)  Occupational History   Not on file  Tobacco Use   Smoking status: Every Day    Current packs/day: 0.50    Average packs/day: 0.5 packs/day for 50.5 years (25.2 ttl pk-yrs)    Types: Cigarettes    Start date: 10/30/1973   Smokeless tobacco: Never  Vaping Use   Vaping status: Never Used  Substance and Sexual Activity   Alcohol use: Not Currently    Comment: One drink every 6 months.   Drug use: No   Sexual activity: Yes  Other Topics Concern   Not on file  Social History Narrative   Not on file    Social Drivers of Health   Financial Resource Strain: Low Risk  (04/19/2024)   Overall Financial Resource Strain (CARDIA)    Difficulty of Paying Living Expenses: Not hard at all  Food Insecurity: No Food Insecurity (04/19/2024)   Hunger Vital Sign    Worried About Running Out of Food in the Last Year: Never true    Ran Out of Food in the Last Year: Never true  Transportation Needs: No Transportation Needs (04/19/2024)   PRAPARE - Administrator, Civil Service (Medical): No    Lack of Transportation (Non-Medical): No  Physical Activity: Unknown (04/19/2024)   Exercise Vital Sign    Days of Exercise per Week: 1 day    Minutes of Exercise per Session: Patient declined  Stress: No Stress Concern Present (04/19/2024)   [employer] of Occupational Health - Occupational Stress Questionnaire    Feeling of Stress: Not at all  Social Connections: Moderately Isolated (04/19/2024)   Social Connection and Isolation Panel    Frequency of Communication with Friends and Family: More than three times a week    Frequency of Social Gatherings with Friends and Family: Once a week    Attends Religious Services: Never    Database administrator or Organizations: No    Attends Engineer, structural: Not on file    Marital Status: Married  Catering manager Violence: Not At Risk (02/29/2024)   Humiliation, Afraid, Rape, and Kick questionnaire    Fear of Current or Ex-Partner: No    Emotionally Abused: No    Physically Abused: No    Sexually Abused: No    Outpatient Encounter Medications as of 04/22/2024  Medication Sig   acetaminophen  (TYLENOL ) 500 MG tablet Take 1,000 mg by mouth every 6 (six) hours as needed for mild pain (pain score 1-3) or fever.   chlorproMAZINE  (THORAZINE ) 10 MG tablet Take 1 tablet (10 mg total) by mouth 3 (three) times daily as needed for hiccoughs.   cholecalciferol  (VITAMIN D ) 1000 UNITS tablet Take 1,000 Units by mouth every evening.   Continuous  Glucose Sensor (FREESTYLE LIBRE 3 PLUS SENSOR) MISC Change sensor every 15 days.   dexamethasone  (DECADRON ) 1 MG tablet Take 1 tablet BID X 3 days; then resume 1 tablet daily.   folic acid  (FOLVITE ) 1 MG tablet Take 1 tablet (1 mg total) by mouth daily.   Krill Oil 300 MG CAPS Take 300 mg by mouth every evening.   levothyroxine  (SYNTHROID ) 100 MCG tablet Take 1 tablet (100 mcg total) by mouth daily before breakfast.   loratadine  (CLARITIN ) 10 MG tablet Take 10 mg by mouth every evening.    losartan  (COZAAR ) 50 MG tablet Take 1.5 tablets (75 mg total) by mouth daily.   Multiple Vitamins-Minerals (MULTIVITAMIN GUMMIES ADULT PO) Take 2 tablets by mouth  every evening.   ondansetron  (ZOFRAN ) 4 MG tablet Take 1 tablet (4 mg total) by mouth every 6 (six) hours as needed for nausea.   pantoprazole  (PROTONIX ) 40 MG tablet Take 1 tablet (40 mg total) by mouth every evening.   polyethylene glycol powder (GLYCOLAX /MIRALAX ) 17 GM/SCOOP powder Take 119 g by mouth daily as needed for mild constipation or moderate constipation.   rosuvastatin  (CRESTOR ) 20 MG tablet Take 1 tablet (20 mg total) by mouth daily.   SYRINGE-NEEDLE, DISP, 3 ML (B-D 3CC LUER-LOK SYR 21GX1-1/2) 21G X 1-1/2 3 ML MISC Use to inject testosterone  every week   testosterone  cypionate (DEPOTESTOTERONE CYPIONATE) 100 MG/ML injection Take 50mg  ( 0.88ml) and 100mg  ( 1ml) into muscle alternatively weekly.   [DISCONTINUED] mirtazapine  (REMERON ) 15 MG tablet Take 1 tablet (15 mg total) by mouth at bedtime.   [DISCONTINUED] venlafaxine  XR (EFFEXOR -XR) 75 MG 24 hr capsule Take 2 capsules (150 mg total) by mouth daily with breakfast. (Patient taking differently: Take 150 mg by mouth at bedtime.)   mirtazapine  (REMERON ) 15 MG tablet Take 1 tablet (15 mg total) by mouth at bedtime.   venlafaxine  XR (EFFEXOR -XR) 75 MG 24 hr capsule Take 2 capsules (150 mg total) by mouth daily with breakfast.   No facility-administered encounter medications on file as of  04/22/2024.    No Known Allergies  Pertinent ROS per HPI, otherwise unremarkable      Objective:  BP 132/76   Pulse 98   Temp (!) 97.4 F (36.3 C)   Ht 5' 11 (1.803 m)   Wt 184 lb 9.6 oz (83.7 kg)   SpO2 99%   BMI 25.75 kg/m    Wt Readings from Last 3 Encounters:  04/22/24 184 lb 9.6 oz (83.7 kg)  04/06/24 182 lb 6.4 oz (82.7 kg)  03/16/24 180 lb 3.2 oz (81.7 kg)    Physical Exam Vitals and nursing note reviewed.  Constitutional:      General: He is not in acute distress.    Appearance: Normal appearance. He is well-developed, well-groomed and normal weight. He is ill-appearing (chronically ill). He is not toxic-appearing or diaphoretic.  HENT:     Head: Normocephalic and atraumatic.     Jaw: There is normal jaw occlusion.     Right Ear: Hearing normal.     Left Ear: Hearing normal.     Nose: Nose normal.     Mouth/Throat:     Lips: Pink.     Mouth: Mucous membranes are moist.     Pharynx: Oropharynx is clear. Uvula midline.  Eyes:     General: Lids are normal.     Extraocular Movements: Extraocular movements intact.     Conjunctiva/sclera: Conjunctivae normal.     Pupils: Pupils are equal, round, and reactive to light.  Neck:     Thyroid : No thyroid  mass, thyromegaly or thyroid  tenderness.     Vascular: No carotid bruit or JVD.     Trachea: Trachea and phonation normal.  Cardiovascular:     Rate and Rhythm: Normal rate and regular rhythm.     Chest Wall: PMI is not displaced.     Pulses: Normal pulses.          Dorsalis pedis pulses are 2+ on the right side and 2+ on the left side.       Posterior tibial pulses are 2+ on the right side and 2+ on the left side.     Heart sounds: Normal heart sounds. No murmur heard.  No friction rub. No gallop.  Pulmonary:     Effort: Pulmonary effort is normal. No respiratory distress.     Breath sounds: Normal breath sounds. No wheezing.  Abdominal:     General: Bowel sounds are normal. There is no distension or  abdominal bruit.     Palpations: Abdomen is soft. There is no hepatomegaly or splenomegaly.     Tenderness: There is no abdominal tenderness. There is no right CVA tenderness or left CVA tenderness.     Hernia: No hernia is present.  Musculoskeletal:        General: Normal range of motion.     Cervical back: Normal range of motion and neck supple.     Right lower leg: No edema.     Left lower leg: No edema.     Right foot: Normal range of motion. No deformity, bunion, Charcot foot, foot drop or prominent metatarsal heads.     Left foot: Normal range of motion. No deformity, bunion, Charcot foot, foot drop or prominent metatarsal heads.  Feet:     Right foot:     Protective Sensation: 10 sites tested.  10 sites sensed.     Skin integrity: Dry skin present.     Toenail Condition: Right toenails are long.     Left foot:     Protective Sensation: 10 sites tested.  10 sites sensed.     Skin integrity: Dry skin present.     Toenail Condition: Left toenails are long.  Lymphadenopathy:     Cervical: No cervical adenopathy.  Skin:    General: Skin is warm and dry.     Capillary Refill: Capillary refill takes less than 2 seconds.     Coloration: Skin is not cyanotic, jaundiced or pale.     Findings: No rash.  Neurological:     General: No focal deficit present.     Mental Status: He is alert and oriented to person, place, and time.     Sensory: Sensation is intact.     Motor: Motor function is intact.     Coordination: Coordination is intact.     Gait: Gait is intact.     Deep Tendon Reflexes: Reflexes are normal and symmetric.  Psychiatric:        Attention and Perception: Attention and perception normal.        Mood and Affect: Mood and affect normal.        Speech: Speech normal.        Behavior: Behavior normal. Behavior is cooperative.        Thought Content: Thought content normal.        Cognition and Memory: Cognition and memory normal.        Judgment: Judgment normal.       Results for orders placed or performed in visit on 04/06/24  CMP (Cancer Center only)   Collection Time: 04/06/24  8:07 AM  Result Value Ref Range   Sodium 144 135 - 145 mmol/L   Potassium 4.1 3.5 - 5.1 mmol/L   Chloride 106 98 - 111 mmol/L   CO2 27 22 - 32 mmol/L   Glucose, Bld 96 70 - 99 mg/dL   BUN 14 8 - 23 mg/dL   Creatinine 9.06 9.38 - 1.24 mg/dL   Calcium  9.4 8.9 - 10.3 mg/dL   Total Protein 7.2 6.5 - 8.1 g/dL   Albumin 4.2 3.5 - 5.0 g/dL   AST 31 15 - 41 U/L   ALT 38 0 -  44 U/L   Alkaline Phosphatase 80 38 - 126 U/L   Total Bilirubin 0.3 0.0 - 1.2 mg/dL   GFR, Estimated >39 >39 mL/min   Anion gap 11 5 - 15  CBC with Differential (Cancer Center Only)   Collection Time: 04/06/24  8:07 AM  Result Value Ref Range   WBC Count 9.7 4.0 - 10.5 K/uL   RBC 4.70 4.22 - 5.81 MIL/uL   Hemoglobin 12.1 (L) 13.0 - 17.0 g/dL   HCT 59.9 60.9 - 47.9 %   MCV 85.1 80.0 - 100.0 fL   MCH 25.7 (L) 26.0 - 34.0 pg   MCHC 30.3 30.0 - 36.0 g/dL   RDW 79.2 (H) 88.4 - 84.4 %   Platelet Count 331 150 - 400 K/uL   nRBC 0.0 0.0 - 0.2 %   Neutrophils Relative % 70 %   Neutro Abs 6.8 1.7 - 7.7 K/uL   Lymphocytes Relative 20 %   Lymphs Abs 2.0 0.7 - 4.0 K/uL   Monocytes Relative 7 %   Monocytes Absolute 0.7 0.1 - 1.0 K/uL   Eosinophils Relative 1 %   Eosinophils Absolute 0.1 0.0 - 0.5 K/uL   Basophils Relative 1 %   Basophils Absolute 0.1 0.0 - 0.1 K/uL   Immature Granulocytes 1 %   Abs Immature Granulocytes 0.07 0.00 - 0.07 K/uL   *Note: Due to a large number of results and/or encounters for the requested time period, some results have not been displayed. A complete set of results can be found in Results Review.       Pertinent labs & imaging results that were available during my care of the patient were reviewed by me and considered in my medical decision making.  Assessment & Plan:  Bradley Goodman was seen today for diabetes.  Diagnoses and all orders for this visit:  Type 2  diabetes mellitus with hyperglycemia, without long-term current use of insulin  (HCC) -     Hgb A1c w/o eAG -     CBC with Differential/Platelet -     CMP14+EGFR -     Lipid panel -     Thyroid  Panel With TSH  Hypertension associated with type 2 diabetes mellitus (HCC) -     Hgb A1c w/o eAG -     CBC with Differential/Platelet -     CMP14+EGFR -     Lipid panel -     Thyroid  Panel With TSH  Hyperlipidemia associated with type 2 diabetes mellitus (HCC) -     Hgb A1c w/o eAG -     CMP14+EGFR -     Lipid panel  Vitamin D  deficiency -     CMP14+EGFR -     Vitamin D , 25-hydroxy  Recurrent major depression in remission (HCC) -     Thyroid  Panel With TSH -     mirtazapine  (REMERON ) 15 MG tablet; Take 1 tablet (15 mg total) by mouth at bedtime. -     venlafaxine  XR (EFFEXOR -XR) 75 MG 24 hr capsule; Take 2 capsules (150 mg total) by mouth daily with breakfast.  Malignant neoplasm metastatic to brain (HCC) -     CBC with Differential/Platelet -     CMP14+EGFR -     mirtazapine  (REMERON ) 15 MG tablet; Take 1 tablet (15 mg total) by mouth at bedtime.  Depression, recurrent (HCC) -     Thyroid  Panel With TSH -     mirtazapine  (REMERON ) 15 MG tablet; Take 1 tablet (15 mg total) by mouth at  bedtime.  Decreased appetite -     Hgb A1c w/o eAG -     CBC with Differential/Platelet -     CMP14+EGFR -     Lipid panel -     Thyroid  Panel With TSH -     mirtazapine  (REMERON ) 15 MG tablet; Take 1 tablet (15 mg total) by mouth at bedtime.  Difficulty sleeping -     Thyroid  Panel With TSH -     mirtazapine  (REMERON ) 15 MG tablet; Take 1 tablet (15 mg total) by mouth at bedtime.  Nocturia -     PSA, total and free     Assessment and Plan    Hypertension Blood pressure was elevated today and has been slightly elevated at home. No associated headaches, chest pain, or leg swelling. Only hand swelling reported.  Diabetes Mellitus Blood sugars are generally well-controlled, though  occasional spikes occur due to dietary habits. No increased hunger, thirst, or urination reported. No changes in management from oncology office.  Depression Currently managed with Remeron  and Effexor . No issues with sleep reported. Occasional irritability noted but not persistent. - Continue Remeron  and Effexor  as prescribed  Hyperlipidemia Currently on cholesterol medication with no significant muscle aches or pains reported. No numbness or tingling in feet. - Continue current cholesterol medication  General Health Maintenance Tetanus vaccination is up to date. Vitamin D  supplementation is ongoing. - Continue vitamin D  supplementation  Follow-up Awaiting results from recent CT scan. - Follow up with oncology for CT scan results          Continue all other maintenance medications.  Follow up plan: Return in about 3 months (around 07/23/2024), or if symptoms worsen or fail to improve, for DM.   Continue healthy lifestyle choices, including diet (rich in fruits, vegetables, and lean proteins, and low in salt and simple carbohydrates) and exercise (at least 30 minutes of moderate physical activity daily).  Educational handout given for DM  The above assessment and management plan was discussed with the patient. The patient verbalized understanding of and has agreed to the management plan. Patient is aware to call the clinic if they develop any new symptoms or if symptoms persist or worsen. Patient is aware when to return to the clinic for a follow-up visit. Patient educated on when it is appropriate to go to the emergency department.   Rosaline Bruns, FNP-C [facility] [phone]

## 2024-04-22 NOTE — Patient Instructions (Signed)

## 2024-04-23 ENCOUNTER — Other Ambulatory Visit: Payer: Self-pay

## 2024-04-23 ENCOUNTER — Ambulatory Visit: Payer: Self-pay | Admitting: Family Medicine

## 2024-04-23 ENCOUNTER — Encounter: Payer: Self-pay | Admitting: Internal Medicine

## 2024-04-23 DIAGNOSIS — R7989 Other specified abnormal findings of blood chemistry: Secondary | ICD-10-CM

## 2024-04-23 LAB — LIPID PANEL
Cholesterol, Total: 119 mg/dL (ref 100–199)
HDL: 40 mg/dL (ref >39)
LDL CALC COMMENT:: 3 ratio (ref 0.0–5.0)
LDL Chol Calc (NIH): 48 mg/dL (ref 0–99)
Triglycerides: 186 mg/dL — AB (ref 0–149)
VLDL Cholesterol Cal: 31 mg/dL (ref 5–40)

## 2024-04-23 LAB — CBC WITH DIFFERENTIAL/PLATELET
Basophils Absolute: 0.1 x10E3/uL (ref 0.0–0.2)
Basos: 1 %
EOS (ABSOLUTE): 0.1 x10E3/uL (ref 0.0–0.4)
Eos: 1 %
Hematocrit: 39.8 % (ref 37.5–51.0)
Hemoglobin: 11.7 g/dL — ABNORMAL LOW (ref 13.0–17.7)
Immature Grans (Abs): 0.2 x10E3/uL — ABNORMAL HIGH (ref 0.0–0.1)
Immature Granulocytes: 1 %
Lymphocytes Absolute: 1.6 x10E3/uL (ref 0.7–3.1)
Lymphs: 12 %
MCH: 25.5 pg — ABNORMAL LOW (ref 26.6–33.0)
MCHC: 29.4 g/dL — ABNORMAL LOW (ref 31.5–35.7)
MCV: 87 fL (ref 79–97)
Monocytes Absolute: 0.8 x10E3/uL (ref 0.1–0.9)
Monocytes: 6 %
Neutrophils Absolute: 10.2 x10E3/uL — ABNORMAL HIGH (ref 1.4–7.0)
Neutrophils: 79 %
Platelets: 298 x10E3/uL (ref 150–450)
RBC: 4.59 x10E6/uL (ref 4.14–5.80)
RDW: 18.8 % — ABNORMAL HIGH (ref 11.6–15.4)
WBC: 12.9 x10E3/uL — ABNORMAL HIGH (ref 3.4–10.8)

## 2024-04-23 LAB — HGB A1C W/O EAG: Hgb A1c MFr Bld: 6.4 % — ABNORMAL HIGH (ref 4.8–5.6)

## 2024-04-23 LAB — CMP14+EGFR
ALT: 29 IU/L (ref 0–44)
AST: 29 IU/L (ref 0–40)
Albumin: 3.9 g/dL (ref 3.9–4.9)
Alkaline Phosphatase: 90 IU/L (ref 44–121)
BUN/Creatinine Ratio: 11 (ref 10–24)
BUN: 10 mg/dL (ref 8–27)
Bilirubin Total: <0.2 mg/dL (ref 0.0–1.2)
CO2: 23 mmol/L (ref 20–29)
Calcium: 9.3 mg/dL (ref 8.6–10.2)
Chloride: 106 mmol/L (ref 96–106)
Creatinine, Ser: 0.93 mg/dL (ref 0.76–1.27)
Globulin, Total: 2.9 g/dL (ref 1.5–4.5)
Glucose: 119 mg/dL — ABNORMAL HIGH (ref 70–99)
Potassium: 4.1 mmol/L (ref 3.5–5.2)
Sodium: 145 mmol/L — ABNORMAL HIGH (ref 134–144)
Total Protein: 6.8 g/dL (ref 6.0–8.5)
eGFR: 91 mL/min/1.73 (ref >59)

## 2024-04-23 LAB — PSA, TOTAL AND FREE
PSA, Free Pct: 13.8
PSA, Free: 0.36 ng/mL
Prostate Specific Ag, Serum: 2.6 ng/mL (ref 0.0–4.0)

## 2024-04-23 LAB — THYROID PANEL WITH TSH
Free Thyroxine Index: 2.3 (ref 1.2–4.9)
T3 Uptake Ratio: 29 (ref 24–39)
T4, Total: 7.9 ug/dL (ref 4.5–12.0)
TSH: 0.103 u[IU]/mL — AB (ref 0.450–4.500)

## 2024-04-23 LAB — VITAMIN D 25 HYDROXY (VIT D DEFICIENCY, FRACTURES): Vit D, 25-Hydroxy: 78.3 ng/mL (ref 30.0–100.0)

## 2024-04-26 NOTE — Progress Notes (Unsigned)
 [facility] OFFICE PROGRESS NOTE  [name], FNP [address] [place] [address]  DIAGNOSIS: Stage IV (T1b, N3, M1C) non-small cell lung cancer, favoring adenocarcinoma presented with right upper lobe lung nodule in addition to right hilar, subcarinal and bilateral mediastinal as well as supraclavicular lymphadenopathy in addition to bone and brain metastasis diagnosed in June 2022.   PD-L1 expression 80%.     Molecular Studies:  Biomarker Findings Microsatellite status - MS-Stable Tumor Mutational Burden - 6 Muts/Mb Genomic Findings For a complete list of the genes assayed, please refer to the Appendix. KRAS G12C, amplification ATM S470* CCND1 amplification - equivocal? HGF amplification - equivocal? MYC amplification - equivocal? FGF19 amplification - equivocal? FGF3 amplification - equivocal? FGF4 amplification - equivocal? NFKBIA amplification NKX2-1 amplification RAD21 amplification - equivocal? [id number]*26 TERT promoter -124C>T TP53 rearrangement exon 9 7 Disease relevant genes with no reportable alterations: ALK, BRAF, EGFR, ERBB2, MET, RET, ROS1   PRIOR THERAPY: SRS to the metastatic brain lesions under the care of Dr. Patrcia.  Last treatment on 05/04/2021.   CURRENT THERAPY: Palliative systemic chemotherapy with carboplatin  for an AUC 5, Alimta  500 mg/m2 and, Keytruda  200 mg IV every 3 weeks.  First dose expected on 05/08/2021.  Status post 50 cycles.  Starting from cycle #5 he is on maintenance treatment with Alimta  and Keytruda  every 3 weeks.    INTERVAL HISTORY: Bradley Goodman 68 y.o. male returns to the clinic today for a follow-up visit accompanied by his wife. The patient was last seen by Dr. Sherrod 3 weeks ago. He is currently on treatment with Alimta  and keytruda .  He tolerates well except for some mild fatigue after treatment which is what he typically reports. He did not mention any changes with his energy today. Today, he  denies any fever, chills, or night sweats. Denies any dyspnea, cough, chest pain, or hemoptysis.  Denies any nausea, vomiting, or diarrhea. He sometimes has constipation which he manages with miralax . He denies unusual headaches.  Denies rashes and skin changes except for easy bruising. He is on steroids for adrenal insufficiency. He is followed by endocrinology. He also has hypothyroidism and hypogonadism.  The patient recently had a restaging CT scan performed. He is here today for evaluation and repeat blood work before undergoing cycle #51.     MEDICAL HISTORY: 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   PONV (postoperative nausea and vomiting)    Type 2 diabetes mellitus (HCC)     ALLERGIES:  has no known allergies.  MEDICATIONS:  Current Outpatient Medications  Medication Sig Dispense Refill   acetaminophen  (TYLENOL ) 500 MG tablet Take 1,000 mg by mouth every 6 (six) hours as needed for mild pain (pain score 1-3) or fever.     chlorproMAZINE  (THORAZINE ) 10 MG tablet Take 1 tablet (10 mg total) by mouth 3 (three) times daily as needed for hiccoughs. 90 tablet 0   cholecalciferol  (VITAMIN D ) 1000 UNITS tablet Take 1,000 Units by mouth every evening.     Continuous Glucose Sensor (FREESTYLE LIBRE 3 PLUS SENSOR) MISC Change sensor every 15 days. 1 each 11   dexamethasone  (DECADRON ) 1 MG tablet Take 1 tablet BID X 3 days; then resume 1 tablet  daily.     folic acid  (FOLVITE ) 1 MG tablet Take 1 tablet (1 mg total) by mouth daily. 90 tablet 0   Krill Oil 300 MG CAPS Take 300 mg by mouth every evening.     levothyroxine  (SYNTHROID ) 100 MCG tablet Take 1 tablet (100 mcg total) by mouth daily before breakfast. 90 tablet 1   loratadine  (CLARITIN ) 10 MG tablet Take 10 mg by mouth every  evening.      losartan  (COZAAR ) 50 MG tablet Take 1.5 tablets (75 mg total) by mouth daily. 135 tablet 0   mirtazapine  (REMERON ) 15 MG tablet Take 1 tablet (15 mg total) by mouth at bedtime. 90 tablet 1   Multiple Vitamins-Minerals (MULTIVITAMIN GUMMIES ADULT PO) Take 2 tablets by mouth every evening.     ondansetron  (ZOFRAN ) 4 MG tablet Take 1 tablet (4 mg total) by mouth every 6 (six) hours as needed for nausea. 20 tablet 0   pantoprazole  (PROTONIX ) 40 MG tablet Take 1 tablet (40 mg total) by mouth every evening. 90 tablet 0   polyethylene glycol powder (GLYCOLAX /MIRALAX ) 17 GM/SCOOP powder Take 119 g by mouth daily as needed for mild constipation or moderate constipation.     rosuvastatin  (CRESTOR ) 20 MG tablet Take 1 tablet (20 mg total) by mouth daily. 90 tablet 0   SYRINGE-NEEDLE, DISP, 3 ML (B-D 3CC LUER-LOK SYR 21GX1-1/2) 21G X 1-1/2 3 ML MISC Use to inject testosterone  every week 100 each 2   testosterone  cypionate (DEPOTESTOTERONE CYPIONATE) 100 MG/ML injection Take 50mg  ( 0.77ml) and 100mg  ( 1ml) into muscle alternatively weekly. 10 mL 1   venlafaxine  XR (EFFEXOR -XR) 75 MG 24 hr capsule Take 2 capsules (150 mg total) by mouth daily with breakfast. 180 capsule 2   No current facility-administered medications for this visit.    SURGICAL HISTORY:  Past Surgical History:  Procedure Laterality Date   Bilateral inguinal hernia repair     BRONCHIAL NEEDLE ASPIRATION BIOPSY  04/05/2021   Procedure: BRONCHIAL NEEDLE ASPIRATION BIOPSIES;  Surgeon: Brenna Adine CROME, DO;  Location: [facility];  Service: Pulmonary;;   COLONOSCOPY  01/23/2012   Procedure: COLONOSCOPY;  Surgeon: Lamar CHRISTELLA Hollingshead, MD;  Location: [facility];  Service: Endoscopy;  Laterality: N/A;  9:30 AM   COLONOSCOPY N/A 10/11/2015   Procedure: COLONOSCOPY;  Surgeon: Lamar CHRISTELLA Hollingshead, MD;  Location: [facility];  Service: Endoscopy;  Laterality: N/A;  830   COLONOSCOPY N/A 10/14/2019   Procedure: COLONOSCOPY;  Surgeon: Hollingshead Lamar CHRISTELLA, MD;  Location: [facility];  Service: Endoscopy;  Laterality: N/A;  1:45   POLYPECTOMY  10/14/2019   Procedure: POLYPECTOMY;  Surgeon: Hollingshead Lamar CHRISTELLA, MD;  Location: [facility];  Service: Endoscopy;;  ascending colon, descending colon   VIDEO BRONCHOSCOPY WITH ENDOBRONCHIAL ULTRASOUND N/A 04/05/2021   Procedure: VIDEO BRONCHOSCOPY WITH ENDOBRONCHIAL ULTRASOUND;  Surgeon: Brenna Adine CROME, DO;  Location: [facility];  Service: Pulmonary;  Laterality: N/A;    REVIEW OF SYSTEMS:   Review of Systems  Constitutional: Improved fatigue and appetite. Negative for chills, fever and unexpected weight change.  HENT: Negative for mouth sores, nosebleeds, sore throat and trouble swallowing.   Eyes: Negative for eye problems and icterus.  Respiratory: Negative for cough, hemoptysis, shortness of breath and wheezing.   Cardiovascular: Negative for chest pain and leg swelling.  Gastrointestinal: Negative for abdominal pain, constipation, diarrhea, nausea and vomiting.  Genitourinary: Negative for bladder incontinence, difficulty urinating, dysuria, frequency and hematuria.   Musculoskeletal: Negative for back pain,  gait problem, neck pain and neck stiffness.  Skin: Negative for itching and rash.  Neurological: Negative for dizziness, extremity weakness, gait problem, headaches, light-headedness and seizures.  Hematological: Negative for adenopathy. Does not bruise/bleed easily.  Psychiatric/Behavioral: Negative for confusion, depression and sleep disturbance. The patient is not nervous/anxious.       PHYSICAL EXAMINATION:  There were no vitals taken for this visit.  ECOG PERFORMANCE STATUS: 1  Physical Exam  Constitutional: Oriented to person, place, and time and well-developed, well-nourished, and in no distress.  HENT:  Head: Normocephalic and atraumatic.  Mouth/Throat: Positive for poor dentition. Oropharynx is clear and moist. No oropharyngeal exudate.  Eyes: Conjunctivae are  normal. Right eye exhibits no discharge. Left eye exhibits no discharge. No scleral icterus.  Neck: Normal range of motion. Neck supple.  Cardiovascular: Normal rate, regular rhythm, normal heart sounds and intact distal pulses.   Pulmonary/Chest: Effort normal and breath sounds normal. No respiratory distress. No wheezes. No rales.  Abdominal: Soft. Bowel sounds are normal. Exhibits no distension and no mass. There is no tenderness.  Musculoskeletal: Normal range of motion. No swelling.  Lymphadenopathy:    No cervical adenopathy.  Neurological: Alert and oriented to person, place, and time. Exhibits normal muscle tone. Gait normal. Coordination normal.  Skin: Positive for upper extremity bruising. Venous stasis dermatitis on lower extremities. Skin is warm and dry. Not diaphoretic. No erythema. No pallor.  Psychiatric: Mood, memory and judgment normal.  Vitals reviewed.  LABORATORY DATA: Lab Results  Component Value Date   WBC 12.9 (H) 04/22/2024   HGB 11.7 (L) 04/22/2024   HCT 39.8 04/22/2024   MCV 87 04/22/2024   PLT 298 04/22/2024      Chemistry      Component Value Date/Time   NA 145 (H) 04/22/2024 0917   K 4.1 04/22/2024 0917   CL 106 04/22/2024 0917   CO2 23 04/22/2024 0917   BUN 10 04/22/2024 0917   CREATININE 0.93 04/22/2024 0917   CREATININE 0.93 04/06/2024 0807      Component Value Date/Time   CALCIUM  9.3 04/22/2024 0917   ALKPHOS 90 04/22/2024 0917   AST 29 04/22/2024 0917   AST 31 04/06/2024 0807   ALT 29 04/22/2024 0917   ALT 38 04/06/2024 0807   BILITOT <0.2 04/22/2024 0917   BILITOT 0.3 04/06/2024 0807       RADIOGRAPHIC STUDIES:  CT CHEST ABDOMEN PELVIS W CONTRAST Result Date: 04/21/2024 CLINICAL DATA:  Non-small cell lung cancer, staging. * Tracking Code: BO * EXAM: CT CHEST, ABDOMEN, AND PELVIS WITH CONTRAST TECHNIQUE: Multidetector CT imaging of the chest, abdomen and pelvis was performed following the standard protocol during bolus  administration of intravenous contrast. RADIATION DOSE REDUCTION: This exam was performed according to the departmental dose-optimization program which includes automated exposure control, adjustment of the mA and/or kV according to patient size and/or use of iterative reconstruction technique. CONTRAST:  OMNIPAQUE  IOHEXOL  300 MG/ML  SOLN COMPARISON:  Multiple priors including CT January 27, 2024 and January 24, 2024. FINDINGS: CT CHEST FINDINGS Cardiovascular: Aortic atherosclerosis. Normal size heart. Trace pericardial effusion. Mediastinum/Nodes: No suspicious thyroid  nodule. Stable prominent mediastinal lymph nodes. No pathologically enlarged mediastinal, hilar or axillary lymph nodes. Esophagus is grossly unremarkable. Lungs/Pleura: Stable scattered bilateral pulmonary nodules/nodularity. For reference: -6 mm nodule in the posterior right upper lobe on image 47/4, unchanged -3 mm nodule in the anterior left upper lobe on image 58/4, unchanged. -irregular nodularity in the right upper lobe measuring 7 mm on  image 65/4, unchanged. -6 mm ground-glass nodule in the central right upper lobe on image 57/4, unchanged. -5 mm ground-glass nodule in the peripheral left upper lobe on image 76/4, unchanged. No new suspicious pulmonary nodules or masses. Biapical pleuroparenchymal scarring. Paraseptal emphysema. Scattered atelectasis/scarring. Musculoskeletal: Stable lucency with narrow zone of transition the posterior T6 vertebral body, favor benign. Stable tiny sclerotic focus in the T4 vertebral body on image 128/6. Stable sclerotic sclerotic foci in the ribs for instance in the right eleventh rib on image 149/5, and the right ninth rib on image 154/5 and in the left eighth rib on image 152/5 CT ABDOMEN PELVIS FINDINGS Hepatobiliary: Diffuse hepatic steatosis. Stable 6 mm hypodense too small to accurately characterize lesion in the inferior right lobe of the liver on image 74/2 and in the right lobe of the liver  measuring 5 mm on image 59/2. Gallbladder is unremarkable.  No biliary ductal dilation. Pancreas: No pancreatic ductal dilation or evidence of acute inflammation. Spleen: No splenomegaly. Adrenals/Urinary Tract: No suspicious adrenal nodule/mass. Nonobstructive left renal stone measures 8 mm on image 70/2. No hydronephrosis. Kidneys demonstrate symmetric enhancement. Urinary bladder is unremarkable for degree of distension. Stomach/Bowel: Stomach is unremarkable for degree of distension. No pathologic dilation of small or large bowel. No evidence of acute bowel inflammation. Vascular/Lymphatic: Aortic atherosclerosis. No pathologically enlarged abdominal or pelvic lymph nodes. Reproductive: Dystrophic calcifications in the enlarged prostate gland. Other: No significant abdominopelvic free fluid. Musculoskeletal: No aggressive. IMPRESSION: 1. Stable scattered bilateral pulmonary nodules/nodularity. No new suspicious pulmonary nodules or masses. 2. Stable prominent mediastinal lymph nodes. 3. Stable sclerotic foci in the ribs and T6 vertebral body, nonspecific but favored benign. 4. Stable 6 mm hypodense too small to accurately characterize lesion in the inferior right lobe of the liver, favored benign. 5. Nonobstructive left renal stone measures 8 mm. 6. Diffuse hepatic steatosis. 7. Aortic Atherosclerosis (ICD10-I70.0) and Emphysema (ICD10-J43.9). Electronically Signed   By: Reyes Holder M.D.   On: 04/21/2024 14:16     ASSESSMENT/PLAN:  This is a very pleasant 68 year old Caucasian male diagnosed with stage IV (T1b, N3, M1 C) non-small cell lung cancer, favoring adenocarcinoma.  The patient presented with a right upper lobe lung nodule in addition to right hilar, subcarinal, and bilateral mediastinal lymphadenopathy as well as supraclavicular lymphadenopathy.  The patient also has metastatic disease to the brain.  He was diagnosed in June 2022.  His PD-L1 expression is 80%.  The patient's molecular studies  show that he has a K-ras G12C mutation which can be used for targeted treatment in the second line setting.    The patient completed SRS to the brain lesions under the care of Dr. Patrcia.  His last treatment was on 05/04/2021. 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     The patient is currently undergoing systemic chemotherapy with carboplatin  for an AUC of 5, Alimta  500 mg per metered squared, Keytruda  200 mg IV every 3 weeks.  Status post 50 cycles. Starting from cycle #5, the patient started maintenance Alimta  and Keytruda  IV every 3 weeks.     The patient was seen with Dr. Sherrod today.  Dr. Sherrod personally and independently reviewed the scan and discussed results with the patient today.  The scan showed no evidence of disease progression.  Dr. Sherrod recommends he continue on the same treatment at the same dose.   He is ok to treat with pulse of 105 bpm today.   Labs were reviewed.  Recommend that he proceed with cycle #51 today as scheduled.   We will see him back for follow-up visit in 3 weeks for evaluation repeat blood work before undergoing cycle #52.  The patient was advised to call immediately if he has any concerning symptoms in the interval. The patient voices understanding of current disease status and treatment options and is in agreement with the current care plan. All questions were answered. The patient knows to call the clinic with any problems, questions or concerns. We can certainly see the patient much sooner if necessary   No orders of the defined types were placed in this encounter.    Bradley Kops L Cayde Held, PA-C 04/26/24  ADDENDUM: Hematology/Oncology Attending: I had a face-to-face encounter with the patient today.  I reviewed his records, lab, scan and recommended his care plan.  This is a very pleasant 68 years old white male with a stage IV non-small cell lung cancer, adenocarcinoma diagnosed in June  2022 with PD-L1 expression of 80% and KRAS G12C mutation.  The patient was started palliative systemic chemotherapy initially with carboplatin , Alimta  and Keytruda  every 3 weeks for 4 cycles and starting from cycle #5 he has been on maintenance treatment with Alimta  and Keytruda  every 3 weeks status post 50 cycles.  He has been tolerating this treatment fairly well with no concerning adverse effect except for mild fatigue. He had repeat CT scan of the chest, abdomen and pelvis performed recently.  I personally and independently reviewed the scan and discussed the results with the patient today.  His scan showed no concerning findings for disease progression. I recommended for him to continue his current maintenance treatment with Alimta  and Keytruda  and he will proceed with cycle #51 today. He will come back for follow-up visit in 3 weeks for evaluation before the next cycle of his treatment. The patient was advised to call immediately if he has any other concerning symptoms in the interval. The total time spent in the appointment was 30 minutes including review of chart and various tests results, discussions about plan of care and coordination of care plan . Disclaimer: This note was dictated with voice recognition software. Similar sounding words can inadvertently be transcribed and may be missed upon review. Sherrod MARLA Sherrod, MD

## 2024-04-27 ENCOUNTER — Inpatient Hospital Stay: Attending: Physician Assistant

## 2024-04-27 ENCOUNTER — Inpatient Hospital Stay

## 2024-04-27 ENCOUNTER — Inpatient Hospital Stay: Attending: Physician Assistant | Admitting: Physician Assistant

## 2024-04-27 VITALS — BP 139/84 | HR 105 | Temp 98.1°F | Resp 18 | Wt 184.4 lb

## 2024-04-27 VITALS — HR 98

## 2024-04-27 DIAGNOSIS — C3411 Malignant neoplasm of upper lobe, right bronchus or lung: Secondary | ICD-10-CM | POA: Diagnosis not present

## 2024-04-27 DIAGNOSIS — K5900 Constipation, unspecified: Secondary | ICD-10-CM | POA: Diagnosis not present

## 2024-04-27 DIAGNOSIS — Z7952 Long term (current) use of systemic steroids: Secondary | ICD-10-CM | POA: Insufficient documentation

## 2024-04-27 DIAGNOSIS — C3491 Malignant neoplasm of unspecified part of right bronchus or lung: Secondary | ICD-10-CM

## 2024-04-27 DIAGNOSIS — Z79890 Hormone replacement therapy (postmenopausal): Secondary | ICD-10-CM | POA: Insufficient documentation

## 2024-04-27 DIAGNOSIS — Z79899 Other long term (current) drug therapy: Secondary | ICD-10-CM | POA: Diagnosis not present

## 2024-04-27 DIAGNOSIS — E039 Hypothyroidism, unspecified: Secondary | ICD-10-CM | POA: Insufficient documentation

## 2024-04-27 DIAGNOSIS — E230 Hypopituitarism: Secondary | ICD-10-CM | POA: Insufficient documentation

## 2024-04-27 DIAGNOSIS — Z5112 Encounter for antineoplastic immunotherapy: Secondary | ICD-10-CM | POA: Insufficient documentation

## 2024-04-27 DIAGNOSIS — C7951 Secondary malignant neoplasm of bone: Secondary | ICD-10-CM | POA: Insufficient documentation

## 2024-04-27 DIAGNOSIS — C7931 Secondary malignant neoplasm of brain: Secondary | ICD-10-CM | POA: Diagnosis not present

## 2024-04-27 DIAGNOSIS — Z5111 Encounter for antineoplastic chemotherapy: Secondary | ICD-10-CM | POA: Insufficient documentation

## 2024-04-27 LAB — CBC WITH DIFFERENTIAL (CANCER CENTER ONLY)
Abs Immature Granulocytes: 0.06 K/uL (ref 0.00–0.07)
Basophils Absolute: 0.1 K/uL (ref 0.0–0.1)
Basophils Relative: 1 %
Eosinophils Absolute: 0.2 K/uL (ref 0.0–0.5)
Eosinophils Relative: 2 %
HCT: 39.5 % (ref 39.0–52.0)
Hemoglobin: 11.8 g/dL — ABNORMAL LOW (ref 13.0–17.0)
Immature Granulocytes: 1 %
Lymphocytes Relative: 24 %
Lymphs Abs: 2.1 K/uL (ref 0.7–4.0)
MCH: 25.2 pg — ABNORMAL LOW (ref 26.0–34.0)
MCHC: 29.9 g/dL — ABNORMAL LOW (ref 30.0–36.0)
MCV: 84.2 fL (ref 80.0–100.0)
Monocytes Absolute: 0.7 K/uL (ref 0.1–1.0)
Monocytes Relative: 8 %
Neutro Abs: 5.6 K/uL (ref 1.7–7.7)
Neutrophils Relative %: 64 %
Platelet Count: 354 K/uL (ref 150–400)
RBC: 4.69 MIL/uL (ref 4.22–5.81)
RDW: 20.4 % — ABNORMAL HIGH (ref 11.5–15.5)
WBC Count: 8.7 K/uL (ref 4.0–10.5)
nRBC: 0 % (ref 0.0–0.2)

## 2024-04-27 LAB — CMP (CANCER CENTER ONLY)
ALT: 22 U/L (ref 0–44)
AST: 22 U/L (ref 15–41)
Albumin: 3.8 g/dL (ref 3.5–5.0)
Alkaline Phosphatase: 70 U/L (ref 38–126)
Anion gap: 7 (ref 5–15)
BUN: 17 mg/dL (ref 8–23)
CO2: 31 mmol/L (ref 22–32)
Calcium: 9.3 mg/dL (ref 8.9–10.3)
Chloride: 105 mmol/L (ref 98–111)
Creatinine: 0.99 mg/dL (ref 0.61–1.24)
GFR, Estimated: >60 mL/min (ref >60)
Glucose, Bld: 110 mg/dL — ABNORMAL HIGH (ref 70–99)
Potassium: 3.9 mmol/L (ref 3.5–5.1)
Sodium: 143 mmol/L (ref 135–145)
Total Bilirubin: 0.3 mg/dL (ref 0.0–1.2)
Total Protein: 6.9 g/dL (ref 6.5–8.1)

## 2024-04-27 MED ORDER — PROCHLORPERAZINE MALEATE 10 MG PO TABS
10.0000 mg | ORAL_TABLET | Freq: Once | ORAL | Status: AC
  Administered 2024-04-27: 10 mg via ORAL
  Filled 2024-04-27: qty 1

## 2024-04-27 MED ORDER — CYANOCOBALAMIN 1000 MCG/ML IJ SOLN
1000.0000 ug | Freq: Once | INTRAMUSCULAR | Status: AC
  Administered 2024-04-27: 1000 ug via INTRAMUSCULAR
  Filled 2024-04-27: qty 1

## 2024-04-27 MED ORDER — SODIUM CHLORIDE 0.9 % IV SOLN: Freq: Once | INTRAVENOUS | Status: AC

## 2024-04-27 MED ORDER — SODIUM CHLORIDE 0.9 % IV SOLN
200.0000 mg | Freq: Once | INTRAVENOUS | Status: AC
  Administered 2024-04-27: 200 mg via INTRAVENOUS
  Filled 2024-04-27: qty 200

## 2024-04-27 MED ORDER — SODIUM CHLORIDE 0.9 % IV SOLN
500.0000 mg/m2 | Freq: Once | INTRAVENOUS | Status: AC
  Administered 2024-04-27: 1000 mg via INTRAVENOUS
  Filled 2024-04-27: qty 40

## 2024-04-27 NOTE — Patient Instructions (Signed)
 [facility] - A DEPT OF [facility]  Discharge Instructions: Thank you for choosing [facility] to provide your oncology and hematology care.   If you have a lab appointment with the Cancer Center, please go directly to the Cancer Center and check in at the registration area.   Wear comfortable clothing and clothing appropriate for easy access to any Portacath or PICC line.   We strive to give you quality time with your provider. You may need to reschedule your appointment if you arrive late (15 or more minutes).  Arriving late affects you and other patients whose appointments are after yours.  Also, if you miss three or more appointments without notifying the office, you may be dismissed from the clinic at the provider's discretion.      For prescription refill requests, have your pharmacy contact our office and allow 72 hours for refills to be completed.    Today you received the following chemotherapy and/or immunotherapy agents: pembrolizumab and pemetrexed      To help prevent nausea and vomiting after your treatment, we encourage you to take your nausea medication as directed.  BELOW ARE SYMPTOMS THAT SHOULD BE REPORTED IMMEDIATELY: *FEVER GREATER THAN 100.4 F (38 C) OR HIGHER *CHILLS OR SWEATING *NAUSEA AND VOMITING THAT IS NOT CONTROLLED WITH YOUR NAUSEA MEDICATION *UNUSUAL SHORTNESS OF BREATH *UNUSUAL BRUISING OR BLEEDING *URINARY PROBLEMS (pain or burning when urinating, or frequent urination) *BOWEL PROBLEMS (unusual diarrhea, constipation, pain near the anus) TENDERNESS IN MOUTH AND THROAT WITH OR WITHOUT PRESENCE OF ULCERS (sore throat, sores in mouth, or a toothache) UNUSUAL RASH, SWELLING OR PAIN  UNUSUAL VAGINAL DISCHARGE OR ITCHING   Items with * indicate a potential emergency and should be followed up as soon as possible or go to the Emergency Department if any problems should occur.  Please show the CHEMOTHERAPY ALERT  CARD or IMMUNOTHERAPY ALERT CARD at check-in to the Emergency Department and triage nurse.  Should you have questions after your visit or need to cancel or reschedule your appointment, please contact [facility] - A DEPT OF [facility]  Dept: [phone]  and follow the prompts.  Office hours are 8:00 a.m. to 4:30 p.m. Monday - Friday. Please note that voicemails left after 4:00 p.m. may not be returned until the following business day.  We are closed weekends and major holidays. You have access to a nurse at all times for urgent questions. Please call the main number to the clinic Dept: [phone] and follow the prompts.   For any non-urgent questions, you may also contact your provider using MyChart. We now offer e-Visits for anyone 75 and older to request care online for non-urgent symptoms. For details visit [email or web address].   Also download the MyChart app! Go to the app store, search "MyChart", open the app, select [place], and log in with your MyChart username and password.

## 2024-04-29 ENCOUNTER — Other Ambulatory Visit (HOSPITAL_COMMUNITY): Payer: Self-pay

## 2024-04-30 ENCOUNTER — Other Ambulatory Visit (HOSPITAL_COMMUNITY): Payer: Self-pay

## 2024-04-30 ENCOUNTER — Other Ambulatory Visit: Payer: Self-pay | Admitting: Family Medicine

## 2024-04-30 DIAGNOSIS — E2740 Unspecified adrenocortical insufficiency: Secondary | ICD-10-CM

## 2024-05-02 ENCOUNTER — Other Ambulatory Visit (HOSPITAL_COMMUNITY): Payer: Self-pay

## 2024-05-04 ENCOUNTER — Other Ambulatory Visit: Payer: Self-pay | Admitting: "Endocrinology

## 2024-05-04 ENCOUNTER — Other Ambulatory Visit (HOSPITAL_COMMUNITY): Payer: Self-pay

## 2024-05-04 DIAGNOSIS — E2740 Unspecified adrenocortical insufficiency: Secondary | ICD-10-CM

## 2024-05-04 MED FILL — Dexamethasone Tab 1 MG: ORAL | 90 days supply | Qty: 90 | Fill #0 | Status: AC

## 2024-05-05 ENCOUNTER — Other Ambulatory Visit: Payer: Self-pay

## 2024-05-07 ENCOUNTER — Ambulatory Visit
Admission: RE | Admit: 2024-05-07 | Discharge: 2024-05-07 | Disposition: A | Discharge status: Home / Self Care | Source: Ambulatory Visit | Attending: Internal Medicine | Admitting: Internal Medicine

## 2024-05-07 DIAGNOSIS — C7931 Secondary malignant neoplasm of brain: Secondary | ICD-10-CM

## 2024-05-07 DIAGNOSIS — G9389 Other specified disorders of brain: Secondary | ICD-10-CM | POA: Diagnosis not present

## 2024-05-07 MED ORDER — GADOPICLENOL 0.5 MMOL/ML IV SOLN
8.0000 mL | Freq: Once | INTRAVENOUS | Status: AC | PRN
  Administered 2024-05-07: 8 mL via INTRAVENOUS

## 2024-05-08 ENCOUNTER — Telehealth: Payer: Self-pay | Admitting: Genetic Counselor

## 2024-05-08 NOTE — Telephone Encounter (Signed)
 I messaged Dr. 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  He should qualify for genetic testing.  The patient will see Dr. Sherrod on 8/11.  I asked that he consider discussing this finding with the patient and see if he is intersetd in pursuing genetic testing.  Darice Monte, MS, CGC  Licensed, Patent attorney [email or web address] phone: [phone]

## 2024-05-11 ENCOUNTER — Inpatient Hospital Stay

## 2024-05-12 ENCOUNTER — Inpatient Hospital Stay: Attending: Physician Assistant | Admitting: Internal Medicine

## 2024-05-12 VITALS — BP 132/86 | HR 97 | Temp 97.7°F | Resp 20 | Wt 187.0 lb

## 2024-05-12 DIAGNOSIS — C3491 Malignant neoplasm of unspecified part of right bronchus or lung: Secondary | ICD-10-CM | POA: Diagnosis not present

## 2024-05-12 DIAGNOSIS — C7931 Secondary malignant neoplasm of brain: Secondary | ICD-10-CM | POA: Diagnosis not present

## 2024-05-12 DIAGNOSIS — C3411 Malignant neoplasm of upper lobe, right bronchus or lung: Secondary | ICD-10-CM | POA: Diagnosis not present

## 2024-05-12 DIAGNOSIS — Z5112 Encounter for antineoplastic immunotherapy: Secondary | ICD-10-CM | POA: Insufficient documentation

## 2024-05-12 DIAGNOSIS — Z5111 Encounter for antineoplastic chemotherapy: Secondary | ICD-10-CM | POA: Diagnosis not present

## 2024-05-12 DIAGNOSIS — C7951 Secondary malignant neoplasm of bone: Secondary | ICD-10-CM | POA: Diagnosis not present

## 2024-05-12 DIAGNOSIS — Z7952 Long term (current) use of systemic steroids: Secondary | ICD-10-CM | POA: Diagnosis not present

## 2024-05-12 DIAGNOSIS — F17210 Nicotine dependence, cigarettes, uncomplicated: Secondary | ICD-10-CM | POA: Insufficient documentation

## 2024-05-12 DIAGNOSIS — Z8589 Personal history of malignant neoplasm of other organs and systems: Secondary | ICD-10-CM

## 2024-05-12 DIAGNOSIS — E230 Hypopituitarism: Secondary | ICD-10-CM | POA: Insufficient documentation

## 2024-05-12 DIAGNOSIS — Z8669 Personal history of other diseases of the nervous system and sense organs: Secondary | ICD-10-CM | POA: Diagnosis not present

## 2024-05-12 DIAGNOSIS — Z79899 Other long term (current) drug therapy: Secondary | ICD-10-CM | POA: Diagnosis not present

## 2024-05-12 NOTE — Progress Notes (Signed)
 [facility] [address]  [place], [place] [address] [phone]   Interval Evaluation  Date of Service: 05/12/24 Patient Name: Bradley Goodman Patient MRN: [id number] Patient DOB: 05-22-1956 Provider: Arthea MARLA Manns, MD  Identifying Statement:  Bradley Goodman is a 68 y.o. male with No diagnosis found.   Primary Cancer:  Oncologic History: Oncology History  Adenocarcinoma of right lung, stage 4 (HCC)  04/25/2021 Initial Diagnosis   Adenocarcinoma of right lung, stage 4 (HCC)   04/25/2021 Cancer Staging   Staging form: Lung, AJCC 8th Edition - Clinical: Stage IVB (cT1b, cN3, cM1c) - Signed by Sherrod Sherrod, MD on 04/25/2021   05/08/2021 -  Chemotherapy   Patient is on Treatment Plan : LUNG Carboplatin  (5) + Pemetrexed  (500) + Pembrolizumab  (200) D1 q21d Induction x 4 cycles / Maintenance Pemetrexed  (500) + Pembrolizumab  (200) D1 q21d      CNS Oncologic History 05/04/21: Radiosurgery to 4 brain metastases, 5 fractions to pituitary lesion  Interval History: Bradley Goodman presents today for follow up after recent MRI brain.  No new or progressive changes today.  No issues with visual impairment as prior.  Remains active with his garden, golfing.  Denies seizures and headaches.  Continues on decadron  1mg  daily through endocrinology.  H+P (05/23/21) Patient presented to medical attention initially on 03/27/21 with new onset left eyelid drooping.  CNS imaging was obtained, which demonstrated multiple enhancing lesions consistent with brain metastases.  Systemic workup demonstrated lung cancer, which was confirmed through endobronchial biopsy.  He underwent single fraction radiosurgery to brain lesions, and fractionated radiosurgery to pituitary lesion.  Following radiation, eyelid drooping improved, but he developed tunnel vision visual deficits, described as like looking through plastic.  This has been more or less static over the past week  or so, though it did improve transiently after dosing decadron  with chemotherapy on 05/08/21.   Medications: Current Outpatient Medications on File Prior to Visit  Medication Sig Dispense Refill   acetaminophen  (TYLENOL ) 500 MG tablet Take 1,000 mg by mouth every 6 (six) hours as needed for mild pain (pain score 1-3) or fever.     chlorproMAZINE  (THORAZINE ) 10 MG tablet Take 1 tablet (10 mg total) by mouth 3 (three) times daily as needed for hiccoughs. 90 tablet 0   cholecalciferol  (VITAMIN D ) 1000 UNITS tablet Take 1,000 Units by mouth every evening.     Continuous Glucose Sensor (FREESTYLE LIBRE 3 PLUS SENSOR) MISC Change sensor every 15 days. 1 each 11   dexamethasone  (DECADRON ) 1 MG tablet Take 1 tablet (1 mg total) by mouth daily with breakfast. 90 tablet 1   folic acid  (FOLVITE ) 1 MG tablet Take 1 tablet (1 mg total) by mouth daily. 90 tablet 0   Krill Oil 300 MG CAPS Take 300 mg by mouth every evening.     levothyroxine  (SYNTHROID ) 100 MCG tablet Take 1 tablet (100 mcg total) by mouth daily before breakfast. 90 tablet 1   loratadine  (CLARITIN ) 10 MG tablet Take 10 mg by mouth every evening.      losartan  (COZAAR ) 50 MG tablet Take 1.5 tablets (75 mg total) by mouth daily. 135 tablet 0   mirtazapine  (REMERON ) 15 MG tablet Take 1 tablet (15 mg total) by mouth at bedtime. 90 tablet 1   Multiple Vitamins-Minerals (MULTIVITAMIN GUMMIES ADULT PO) Take 2 tablets by mouth every evening.     ondansetron  (ZOFRAN ) 4 MG tablet Take 1 tablet (4 mg total) by mouth  every 6 (six) hours as needed for nausea. 20 tablet 0   pantoprazole  (PROTONIX ) 40 MG tablet Take 1 tablet (40 mg total) by mouth every evening. 90 tablet 0   polyethylene glycol powder (GLYCOLAX /MIRALAX ) 17 GM/SCOOP powder Take 119 g by mouth daily as needed for mild constipation or moderate constipation.     rosuvastatin  (CRESTOR ) 20 MG tablet Take 1 tablet (20 mg total) by mouth daily. 90 tablet 0   SYRINGE-NEEDLE, DISP, 3 ML (B-D 3CC  LUER-LOK SYR 21GX1-1/2) 21G X 1-1/2 3 ML MISC Use to inject testosterone  every week 100 each 2   testosterone  cypionate (DEPOTESTOTERONE CYPIONATE) 100 MG/ML injection Take 50mg  ( 0.55ml) and 100mg  ( 1ml) into muscle alternatively weekly. (Patient taking differently: Inject 50 mg into the muscle once a week. Take 50mg  ( 0.22ml)  into muscle alternatively weekly.) 10 mL 1   venlafaxine  XR (EFFEXOR -XR) 75 MG 24 hr capsule Take 2 capsules (150 mg total) by mouth daily with breakfast. 180 capsule 2   No current facility-administered medications on file prior to visit.    Allergies: No Known Allergies Past Medical History:  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   PONV (postoperative nausea and vomiting)    Type 2 diabetes mellitus (HCC)    Past Surgical History:  Past Surgical History:  Procedure Laterality Date   Bilateral inguinal hernia repair     BRONCHIAL NEEDLE ASPIRATION BIOPSY  04/05/2021   Procedure: BRONCHIAL NEEDLE ASPIRATION BIOPSIES;  Surgeon: Brenna Adine CROME, DO;  Location: [facility];  Service: Pulmonary;;   COLONOSCOPY  01/23/2012   Procedure: COLONOSCOPY;  Surgeon: Lamar CHRISTELLA Hollingshead, MD;  Location: [facility];  Service: Endoscopy;  Laterality: N/A;  9:30 AM   COLONOSCOPY N/A 10/11/2015   Procedure: COLONOSCOPY;  Surgeon: Lamar CHRISTELLA Hollingshead, MD;  Location: [facility];  Service: Endoscopy;  Laterality: N/A;  830   COLONOSCOPY N/A 10/14/2019   Procedure: COLONOSCOPY;  Surgeon: Hollingshead Lamar CHRISTELLA, MD;  Location: [facility];  Service: Endoscopy;  Laterality: N/A;  1:45   POLYPECTOMY  10/14/2019   Procedure: POLYPECTOMY;  Surgeon: Hollingshead Lamar CHRISTELLA, MD;  Location: [facility];  Service: Endoscopy;;  ascending colon, descending colon   VIDEO BRONCHOSCOPY WITH ENDOBRONCHIAL  ULTRASOUND N/A 04/05/2021   Procedure: VIDEO BRONCHOSCOPY WITH ENDOBRONCHIAL ULTRASOUND;  Surgeon: Brenna Adine CROME, DO;  Location: [facility];  Service: Pulmonary;  Laterality: N/A;   Social History:  Social History   Socioeconomic History   Marital status: Married    Spouse name: Not on file   Number of children: Not on file   Years of education: Not on file   Highest education level: Bachelor's degree (e.g., BA, AB, BS)  Occupational History   Not on file  Tobacco Use   Smoking status: Every Day    Current packs/day: 0.50    Average packs/day: 0.5 packs/day for 50.5 years (25.3 ttl pk-yrs)    Types: Cigarettes    Start date: 10/30/1973   Smokeless tobacco: Never  Vaping Use   Vaping status: Never Used  Substance and Sexual Activity   Alcohol use: Not Currently    Comment: One drink every 6 months.   Drug use: No   Sexual activity: Yes  Other Topics Concern   Not on file  Social History Narrative   Not on file   Social Drivers of Health   Financial Resource Strain: Low Risk  (04/19/2024)   Overall Financial Resource Strain (CARDIA)    Difficulty of Paying Living Expenses: Not hard at all  Food Insecurity: No Food Insecurity (04/19/2024)   Hunger Vital Sign    Worried About Running Out of Food in the Last Year: Never true    Ran Out of Food in the Last Year: Never true  Transportation Needs: No Transportation Needs (04/19/2024)   PRAPARE - Administrator, Civil Service (Medical): No    Lack of Transportation (Non-Medical): No  Physical Activity: Unknown (04/19/2024)   Exercise Vital Sign    Days of Exercise per Week: 1 day    Minutes of Exercise per Session: Patient declined  Stress: No Stress Concern Present (04/19/2024)   [employer] of Occupational Health - Occupational Stress Questionnaire    Feeling of Stress: Not at all  Social Connections: Moderately Isolated (04/19/2024)   Social Connection and Isolation Panel    Frequency of Communication  with Friends and Family: More than three times a week    Frequency of Social Gatherings with Friends and Family: Once a week    Attends Religious Services: Never    Database administrator or Organizations: No    Attends Engineer, structural: Not on file    Marital Status: Married  Catering manager Violence: Not At Risk (02/29/2024)   Humiliation, Afraid, Rape, and Kick questionnaire    Fear of Current or Ex-Partner: No    Emotionally Abused: No    Physically Abused: No    Sexually Abused: No   Family History:  Family History  Problem Relation Age of Onset   Hypertension Mother    Diabetes Mother    Heart attack Mother    Hypertension Father    Heart attack Father    Heart attack Brother    Colon cancer Neg Hx     Review of Systems: Constitutional: Doesn't report fevers, chills or abnormal weight loss Eyes: Doesn't report blurriness of vision Ears, nose, mouth, throat, and face: Doesn't report sore throat Respiratory: Doesn't report cough, dyspnea or wheezes Cardiovascular: Doesn't report palpitation, chest discomfort  Gastrointestinal:  Doesn't report nausea, constipation, diarrhea GU: Doesn't report incontinence Skin: Doesn't report skin rashes Neurological: Per HPI Musculoskeletal: Doesn't report joint pain Behavioral/Psych: Doesn't report anxiety  Physical Exam: Vitals:   05/12/24 1231  BP: 132/86  Pulse: 97  Resp: 20  Temp: 97.7 F (36.5 C)  SpO2: 98%     KPS: 90. General: Alert, cooperative, pleasant, in no acute distress Head: Normal EENT: No conjunctival injection or scleral icterus.  Lungs: Resp effort normal Cardiac: Regular rate Abdomen: Non-distended abdomen Skin: No rashes cyanosis or petechiae. Extremities: No clubbing or edema  Neurologic Exam: Mental Status: Awake, alert, attentive to examiner. Oriented to self and environment. Language is fluent with intact comprehension.  Cranial Nerves: Visual acuity is grossly normal. Visual  fields are full. Extra-ocular movements intact. No ptosis. Face is symmetric Motor: Tone and bulk are normal. Power is full in both arms and legs. Reflexes are symmetric, no pathologic reflexes present.  Sensory: Intact to light touch Gait: Normal.   Labs: I have reviewed the data as listed    Component Value Date/Time   NA 143 04/27/2024 1103   NA 145 (H) 04/22/2024 0917   K 3.9 04/27/2024 1103  CL 105 04/27/2024 1103   CO2 31 04/27/2024 1103   GLUCOSE 110 (H) 04/27/2024 1103   BUN 17 04/27/2024 1103   BUN 10 04/22/2024 0917   CREATININE 0.99 04/27/2024 1103   CALCIUM  9.3 04/27/2024 1103   PROT 6.9 04/27/2024 1103   PROT 6.8 04/22/2024 0917   ALBUMIN 3.8 04/27/2024 1103   ALBUMIN 3.9 04/22/2024 0917   AST 22 04/27/2024 1103   ALT 22 04/27/2024 1103   ALKPHOS 70 04/27/2024 1103   BILITOT 0.3 04/27/2024 1103   GFRNONAA >60 04/27/2024 1103   GFRAA 82 07/14/2020 0945   Lab Results  Component Value Date   WBC 8.7 04/27/2024   NEUTROABS 5.6 04/27/2024   HGB 11.8 (L) 04/27/2024   HCT 39.5 04/27/2024   MCV 84.2 04/27/2024   PLT 354 04/27/2024   Imaging:  [facility] Clinician Interpretation: I have personally reviewed the CNS images as listed.  My interpretation, in the context of the patient's clinical presentation, is treatment effect vs true progression  MR BRAIN W WO CONTRAST Result Date: 05/07/2024 CLINICAL DATA:  CNS neoplasm, assess treatment response. Malignant neoplasm metastatic to brain. EXAM: MRI HEAD WITHOUT AND WITH CONTRAST TECHNIQUE: Multiplanar, multiecho pulse sequences of the brain and surrounding structures were obtained without and with intravenous contrast. CONTRAST:  8 mL of Vueway  intravenous contrast COMPARISON:  MRI head 02/13/2024 and earlier. FINDINGS: Brain: Enhancing lesion in the right parietal lobe cortex measures 3.5 mm, unchanged since 02/13/2024 (series 14, image 113). Additional punctate lesion in the left cerebellum measures approximately 2 mm,  similar to prior (series 14, image 70). There are no new or enlarging intracranial lesions on the current study. No significant vasogenic edema. No mass effect or midline shift. Previously noted focus of enhancement in the left frontal lobe is not appreciated on the current study. No acute infarct. No evidence of intracranial hemorrhage. T2/FLAIR hyperintensity in the periventricular and subcortical white matter. The basilar cisterns are patent. No extra-axial fluid collections. Ventricles: Normal size and configuration of the ventricles. Vascular: Skull base flow voids are visualized. Skull and upper cervical spine: No focal abnormality. Sinuses/Orbits: Orbits are symmetric. Mild mucosal thickening in the ethmoid sinuses. Other: Right mastoid effusion similar to prior. IMPRESSION: Similar appearance of punctate enhancement in the left cerebellum. Additional 3.5 mm enhancing lesion in the right parietal cortex is unchanged. Previously noted focus of enhancement in the left frontal lobe is not appreciated on the current study. No new or enlarging intracranial lesions. Chronic microvascular ischemic changes. Right mastoid effusion. Electronically Signed   By: Donnice Mania M.D.   On: 05/07/2024 13:35   CT CHEST ABDOMEN PELVIS W CONTRAST Result Date: 04/21/2024 CLINICAL DATA:  Non-small cell lung cancer, staging. * Tracking Code: BO * EXAM: CT CHEST, ABDOMEN, AND PELVIS WITH CONTRAST TECHNIQUE: Multidetector CT imaging of the chest, abdomen and pelvis was performed following the standard protocol during bolus administration of intravenous contrast. RADIATION DOSE REDUCTION: This exam was performed according to the departmental dose-optimization program which includes automated exposure control, adjustment of the mA and/or kV according to patient size and/or use of iterative reconstruction technique. CONTRAST:  OMNIPAQUE  IOHEXOL  300 MG/ML  SOLN COMPARISON:  Multiple priors including CT January 27, 2024 and January 24, 2024. FINDINGS: CT CHEST FINDINGS Cardiovascular: Aortic atherosclerosis. Normal size heart. Trace pericardial effusion. Mediastinum/Nodes: No suspicious thyroid  nodule. Stable prominent mediastinal lymph nodes. No pathologically enlarged mediastinal, hilar or axillary lymph nodes. Esophagus is grossly unremarkable. Lungs/Pleura: Stable scattered bilateral pulmonary nodules/nodularity. For  reference: -6 mm nodule in the posterior right upper lobe on image 47/4, unchanged -3 mm nodule in the anterior left upper lobe on image 58/4, unchanged. -irregular nodularity in the right upper lobe measuring 7 mm on image 65/4, unchanged. -6 mm ground-glass nodule in the central right upper lobe on image 57/4, unchanged. -5 mm ground-glass nodule in the peripheral left upper lobe on image 76/4, unchanged. No new suspicious pulmonary nodules or masses. Biapical pleuroparenchymal scarring. Paraseptal emphysema. Scattered atelectasis/scarring. Musculoskeletal: Stable lucency with narrow zone of transition the posterior T6 vertebral body, favor benign. Stable tiny sclerotic focus in the T4 vertebral body on image 128/6. Stable sclerotic sclerotic foci in the ribs for instance in the right eleventh rib on image 149/5, and the right ninth rib on image 154/5 and in the left eighth rib on image 152/5 CT ABDOMEN PELVIS FINDINGS Hepatobiliary: Diffuse hepatic steatosis. Stable 6 mm hypodense too small to accurately characterize lesion in the inferior right lobe of the liver on image 74/2 and in the right lobe of the liver measuring 5 mm on image 59/2. Gallbladder is unremarkable.  No biliary ductal dilation. Pancreas: No pancreatic ductal dilation or evidence of acute inflammation. Spleen: No splenomegaly. Adrenals/Urinary Tract: No suspicious adrenal nodule/mass. Nonobstructive left renal stone measures 8 mm on image 70/2. No hydronephrosis. Kidneys demonstrate symmetric enhancement. Urinary bladder is unremarkable for degree of  distension. Stomach/Bowel: Stomach is unremarkable for degree of distension. No pathologic dilation of small or large bowel. No evidence of acute bowel inflammation. Vascular/Lymphatic: Aortic atherosclerosis. No pathologically enlarged abdominal or pelvic lymph nodes. Reproductive: Dystrophic calcifications in the enlarged prostate gland. Other: No significant abdominopelvic free fluid. Musculoskeletal: No aggressive. IMPRESSION: 1. Stable scattered bilateral pulmonary nodules/nodularity. No new suspicious pulmonary nodules or masses. 2. Stable prominent mediastinal lymph nodes. 3. Stable sclerotic foci in the ribs and T6 vertebral body, nonspecific but favored benign. 4. Stable 6 mm hypodense too small to accurately characterize lesion in the inferior right lobe of the liver, favored benign. 5. Nonobstructive left renal stone measures 8 mm. 6. Diffuse hepatic steatosis. 7. Aortic Atherosclerosis (ICD10-I70.0) and Emphysema (ICD10-J43.9). Electronically Signed   By: Reyes Holder M.D.   On: 04/21/2024 14:16     Assessment/Plan No diagnosis found.  Bradley Goodman is clinically and radiographically stable today.  MRI brain demonstrates stability of enhancement within treated right parietal lesion.  Previously seen tiny focus of enhancement within left frontal lobe is not visible on present study.  Will continue imaging surveillance only at this time.  For pan-hypopit, will continue to follow with Dr. Lenis.    He will con't to undergo chemotherapy with Dr. Sherrod.  We appreciate the opportunity to participate in the care of Bradley Goodman.   We ask that Bradley Goodman return to clinic in 4 months following next brain MRI, or sooner as needed.  All questions were answered. The patient knows to call the clinic with any problems, questions or concerns. No barriers to learning were detected.  The total time spent in the encounter was 30 minutes and more than 50% was on counseling and review of  test results   Arthea MARLA Manns, MD Medical Director of Neuro-Oncology [facility] 05/12/24 1:39 PM

## 2024-05-13 ENCOUNTER — Ambulatory Visit (INDEPENDENT_AMBULATORY_CARE_PROVIDER_SITE_OTHER): Admitting: Gastroenterology

## 2024-05-13 ENCOUNTER — Encounter: Payer: Self-pay | Admitting: Gastroenterology

## 2024-05-13 VITALS — BP 136/88 | HR 103 | Temp 98.9°F | Ht 71.5 in | Wt 187.4 lb

## 2024-05-13 DIAGNOSIS — Z8719 Personal history of other diseases of the digestive system: Secondary | ICD-10-CM | POA: Diagnosis not present

## 2024-05-13 DIAGNOSIS — K219 Gastro-esophageal reflux disease without esophagitis: Secondary | ICD-10-CM

## 2024-05-13 DIAGNOSIS — K5900 Constipation, unspecified: Secondary | ICD-10-CM

## 2024-05-13 DIAGNOSIS — K5909 Other constipation: Secondary | ICD-10-CM | POA: Diagnosis not present

## 2024-05-13 NOTE — Progress Notes (Signed)
 GI Office Note    Referring Provider: [name], FNP Primary Care Physician:  [name], FNP Primary Gastroenterologist: Lamar HERO.Rourk, MD  Date:  05/13/2024  ID:  ADIS STURGILL, DOB 07/08/1956, MRN [id number]   Chief Complaint   Chief Complaint  Patient presents with   Follow-up    Doing well, no issues   History of Present Illness  IZEAR PINE is a 68 y.o. 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  Colonoscopy April 2013: - 5 mm polyp, 5 cm from anal verge. - Minimal internal hemorrhoids and anal papillae - Large nearly 2 cm polyp in the sigmoid colon with long stalk, clipping performed post removal. - Distal TI appeared normal. - 8 mm and 6 mm pedunculated polyp in the descending segment -Path: Tubular adenomas   Colonoscopy January 2017: -Minimal internal hemorrhoids and anal papilla - Normal-appearing rectal mucosa - Tattooed site of prior left colon polypectomy readily identified - 5 mm polyp in the mid sigmoid segment - Two 5 mm polyps at the splenic flexure - Pathology revealed tubular adenomas.   Colonoscopy January 2021: - Two 4 to 6 mm polyps in the descending colon and in the ascending colon - The examination was otherwise normal on direct and retroflexion views. - Path: Sessile serrated polyp without cytologic dysplasia, tubular adenoma without high-grade dysplasia - Recommended follow-up in 5 years   During hospitalization in February, he had placement of NG tube with some high outputs however he underwent small bowel protocol imaging with evidence of contrast throughout the colon therefore exploratory laparotomy was canceled.  During this hospitalization he did report some diarrhea therefore could be possible infectious enteritis at that time causing small bowel  obstruction.   Chest CT in February revealed multifocal pulmonary nodules predominantly within the right upper lobe findings concerning for worsening pulmonary metastatic disease.   Recently hospitalized at [facility] 4/16-4/18 where he presented with a week of abdominal bloating and generalized weakness.  Noted history of small bowel obstruction.  CT of the abdomen pelvis 4/12 showed nonobstructing left renal stone and resolution of previously seen small bowel obstruction.  He was admitted for failure to thrive .  Head CT in February with evidence of ileus versus enteritis given mildly dilated loops of small bowel in the anterior abdomen.  Last office visit 02/04/24. Intermittent LLQ pain and having more gas pains.  History of bowel obstruction and was stated that Dr. KANDICE can said he might have a mild volvulus.  Denied any issues with constipation or diarrhea.  No issues since being off Ozempic  since February.  Did admit to some on and off constipation with 3-4 days that a bowel movement and does have to strain occasionally but stool soft.  Drinks protein shakes and has taken iron before but not recently.  Does take Colace daily.  Advised MiraLAX  17 g daily and reduce if having diarrhea and continue Colace.  High-fiber diet and advise continue pantoprazole  40 mg once daily and follow-up in 4 months.  Today:  Discussed the use of AI scribe software for clinical note transcription with the patient, who gave verbal consent to proceed.  He experiences ongoing constipation, which is manageable with Miralax  taken two to three times a week. This helps soften stools, though he sometimes has difficulty initiating bowel movements. He has not experienced  diarrhea with daily Miralax  use and is considering more consistent use. He has not tried fiber supplements like Benefiber or Metamucil, although he has Benefiber at home.  Regarding reflux, he experienced burning symptoms yesterday, potentially alleviated by belching. No  specific dietary triggers were identified, and eating a meal, specifically banana pancakes, seemed to help. He takes pantoprazole  once daily and reports that reflux is not frequent but occurs more than it used to. Pepcid Complete is used effectively for breakthrough symptoms. He does not use NSAIDs like Aleve or ibuprofen.  No blood in stool, black stools, chest pain, shortness of breath, or trouble swallowing. No recurrence of abdominal pain since a previous bowel obstruction.      Wt Readings from Last 5 Encounters:  05/13/24 187 lb 6.4 oz (85 kg)  05/12/24 187 lb (84.8 kg)  04/27/24 184 lb 6.4 oz (83.6 kg)  04/22/24 184 lb 9.6 oz (83.7 kg)  04/06/24 182 lb 6.4 oz (82.7 kg)    Current Outpatient Medications  Medication Sig Dispense Refill   acetaminophen  (TYLENOL ) 500 MG tablet Take 1,000 mg by mouth every 6 (six) hours as needed for mild pain (pain score 1-3) or fever.     chlorproMAZINE  (THORAZINE ) 10 MG tablet Take 1 tablet (10 mg total) by mouth 3 (three) times daily as needed for hiccoughs. 90 tablet 0   cholecalciferol  (VITAMIN D ) 1000 UNITS tablet Take 1,000 Units by mouth every evening.     Continuous Glucose Sensor (FREESTYLE LIBRE 3 PLUS SENSOR) MISC Change sensor every 15 days. 1 each 11   dexamethasone  (DECADRON ) 1 MG tablet Take 1 tablet (1 mg total) by mouth daily with breakfast. 90 tablet 1   folic acid  (FOLVITE ) 1 MG tablet Take 1 tablet (1 mg total) by mouth daily. 90 tablet 0   Krill Oil 300 MG CAPS Take 300 mg by mouth every evening.     levothyroxine  (SYNTHROID ) 100 MCG tablet Take 1 tablet (100 mcg total) by mouth daily before breakfast. 90 tablet 1   loratadine  (CLARITIN ) 10 MG tablet Take 10 mg by mouth every evening.      losartan  (COZAAR ) 50 MG tablet Take 1.5 tablets (75 mg total) by mouth daily. 135 tablet 0   mirtazapine  (REMERON ) 15 MG tablet Take 1 tablet (15 mg total) by mouth at bedtime. 90 tablet 1   Multiple Vitamins-Minerals (MULTIVITAMIN GUMMIES ADULT  PO) Take 2 tablets by mouth every evening.     ondansetron  (ZOFRAN ) 4 MG tablet Take 1 tablet (4 mg total) by mouth every 6 (six) hours as needed for nausea. 20 tablet 0   pantoprazole  (PROTONIX ) 40 MG tablet Take 1 tablet (40 mg total) by mouth every evening. 90 tablet 0   polyethylene glycol powder (GLYCOLAX /MIRALAX ) 17 GM/SCOOP powder Take 119 g by mouth daily as needed for mild constipation or moderate constipation.     rosuvastatin  (CRESTOR ) 20 MG tablet Take 1 tablet (20 mg total) by mouth daily. 90 tablet 0   SYRINGE-NEEDLE, DISP, 3 ML (B-D 3CC LUER-LOK SYR 21GX1-1/2) 21G X 1-1/2 3 ML MISC Use to inject testosterone  every week 100 each 2   testosterone  cypionate (DEPOTESTOTERONE CYPIONATE) 100 MG/ML injection Take 50mg  ( 0.32ml) and 100mg  ( 1ml) into muscle alternatively weekly. 10 mL 1   venlafaxine  XR (EFFEXOR -XR) 75 MG 24 hr capsule Take 2 capsules (150 mg total) by mouth daily with breakfast. 180 capsule 2   No current facility-administered medications for this visit.    Past Medical History:  Diagnosis  Date   Cervical spondylolysis    Essential hypertension    GERD (gastroesophageal reflux disease)    History of kidney stones    History of migraine    Hyperlipidemia    Hypertension    lung ca with mets to brain 2022   PONV (postoperative nausea and vomiting)    Type 2 diabetes mellitus (HCC)     Past Surgical History:  Procedure Laterality Date   Bilateral inguinal hernia repair     BRONCHIAL NEEDLE ASPIRATION BIOPSY  04/05/2021   Procedure: BRONCHIAL NEEDLE ASPIRATION BIOPSIES;  Surgeon: Brenna Adine CROME, DO;  Location: [facility];  Service: Pulmonary;;   COLONOSCOPY  01/23/2012   Procedure: COLONOSCOPY;  Surgeon: Lamar CHRISTELLA Hollingshead, MD;  Location: [facility];  Service: Endoscopy;  Laterality: N/A;  9:30 AM   COLONOSCOPY N/A 10/11/2015   Procedure: COLONOSCOPY;  Surgeon: Lamar CHRISTELLA Hollingshead, MD;  Location: [facility];  Service: Endoscopy;  Laterality: N/A;  830    COLONOSCOPY N/A 10/14/2019   Procedure: COLONOSCOPY;  Surgeon: Hollingshead Lamar CHRISTELLA, MD;  Location: [facility];  Service: Endoscopy;  Laterality: N/A;  1:45   POLYPECTOMY  10/14/2019   Procedure: POLYPECTOMY;  Surgeon: Hollingshead Lamar CHRISTELLA, MD;  Location: [facility];  Service: Endoscopy;;  ascending colon, descending colon   VIDEO BRONCHOSCOPY WITH ENDOBRONCHIAL ULTRASOUND N/A 04/05/2021   Procedure: VIDEO BRONCHOSCOPY WITH ENDOBRONCHIAL ULTRASOUND;  Surgeon: Brenna Adine CROME, DO;  Location: [facility];  Service: Pulmonary;  Laterality: N/A;    Family History  Problem Relation Age of Onset   Hypertension Mother    Diabetes Mother    Heart attack Mother    Hypertension Father    Heart attack Father    Heart attack Brother    Colon cancer Neg Hx     Allergies as of 05/13/2024   (No Known Allergies)    Social History   Socioeconomic History   Marital status: Married    Spouse name: Not on file   Number of children: Not on file   Years of education: Not on file   Highest education level: Bachelor's degree (e.g., BA, AB, BS)  Occupational History   Not on file  Tobacco Use   Smoking status: Every Day    Current packs/day: 0.50    Average packs/day: 0.5 packs/day for 50.5 years (25.3 ttl pk-yrs)    Types: Cigarettes    Start date: 10/30/1973   Smokeless tobacco: Never  Vaping Use   Vaping status: Never Used  Substance and Sexual Activity   Alcohol use: Not Currently    Comment: One drink every 6 months.   Drug use: No   Sexual activity: Yes  Other Topics Concern   Not on file  Social History Narrative   Not on file   Social Drivers of Health   Financial Resource Strain: Low Risk  (04/19/2024)   Overall Financial Resource Strain (CARDIA)    Difficulty of Paying Living Expenses: Not hard at all  Food Insecurity: No Food Insecurity (04/19/2024)   Hunger Vital Sign    Worried About Running Out of Food in the Last Year: Never true    Ran Out of Food in the Last Year: Never true   Transportation Needs: No Transportation Needs (04/19/2024)   PRAPARE - Administrator, Civil Service (Medical): No    Lack of Transportation (Non-Medical): No  Physical Activity: Unknown (04/19/2024)   Exercise Vital Sign    Days of Exercise per Week: 1  day    Minutes of Exercise per Session: Patient declined  Stress: No Stress Concern Present (04/19/2024)   [employer] of Occupational Health - Occupational Stress Questionnaire    Feeling of Stress: Not at all  Social Connections: Moderately Isolated (04/19/2024)   Social Connection and Isolation Panel    Frequency of Communication with Friends and Family: More than three times a week    Frequency of Social Gatherings with Friends and Family: Once a week    Attends Religious Services: Never    Database administrator or Organizations: No    Attends Engineer, structural: Not on file    Marital Status: Married     Review of Systems   Gen: Denies fever, chills, anorexia. Denies fatigue, weakness, weight loss.  CV: Denies chest pain, palpitations, syncope, peripheral edema, and claudication. Resp: Denies dyspnea at rest, cough, wheezing, coughing up blood, and pleurisy. GI: See HPI Derm: Denies rash, itching, dry skin Psych: Denies depression, anxiety, memory loss, confusion. No homicidal or suicidal ideation.  Heme: Denies bruising, bleeding, and enlarged lymph nodes.  Physical Exam   BP 136/88 (BP Location: Right Arm, Patient Position: Sitting, Cuff Size: Normal)   Pulse (!) 103   Temp 98.9 F (37.2 C) (Oral)   Ht 5' 11.5 (1.816 m)   Wt 187 lb 6.4 oz (85 kg)   SpO2 95%   BMI 25.77 kg/m   General:   Alert and oriented. No distress noted. Pleasant and cooperative.  Head:  Normocephalic and atraumatic. Eyes:  Conjuctiva clear without scleral icterus. Mouth:  Oral mucosa pink and moist. Good dentition. No lesions. Lungs:  Clear to auscultation bilaterally. No wheezes, rales, or rhonchi. No distress.   Heart:  S1, S2 present without murmurs appreciated.  Abdomen:  +BS, soft, non-tender and non-distended. No rebound or guarding. No HSM or masses noted. Rectal: deferred Msk:  Symmetrical without gross deformities. Normal posture. Extremities:  Without edema. Neurologic:  Alert and  oriented x4 Psych:  Alert and cooperative. Normal mood and affect.  Assessment  MICKEAL DAWS is a 68 y.o. male presenting today for follow-up of constipation and reflux with history of SBO.    Chronic constipation, history of SBO Chronic constipation managed with inconsistent Miralax  use (2-3 times a week). Stools are softened, but difficulty initiating bowel movements persists. No significant diarrhea with daily Miralax  use. No fiber supplements currently used but does have benefiber tablets at home available. SBO and prior gassiness and bloating likely secondary to chemotherapy given previous treatments with Alimta  and Keytruda .  Previously discussed that management of constipation is key in trying to prevent recurrence of bowel obstruction, possibility of mild volvulus remains a potential etiology. - Recommend daily Miralax  1 capful to improve bowel movement regularity. - Introduce fiber supplements such as Metamucil or Benefiber, in powder or tablet form, to enhance stool bulk and ease of passage. - Advised mixing Miralax  with fiber supplements if preferred. - Caution about potential initial bloating with increased fiber intake.  Gastroesophageal reflux disease (GERD) without esophagitis GERD symptoms include occasional burning sensation, occurring more frequently than previously. No specific dietary triggers identified. Managed with pantoprazole  once daily. Pepcid (famotidine) or Tums used for breakthrough symptoms. Pepcid Complete effective for nighttime relief. No NSAID use reported. No alarming symptoms such as blood in stool, black stool, chest pain, or dysphagia. - Continue pantoprazole  once daily. -  Use Pepcid (famotidine) 10-20 mg as needed for breakthrough symptoms, especially before meals or at bedtime. - Consider  Tums for immediate relief if needed. - Monitor frequency of symptoms; if symptoms occur more than 3-4 times a week, will consider increasing pantoprazole  to twice daily.     PLAN   Follow up in January to discuss scheduling surveillance colonoscopy.   Charmaine Melia, MSN, FNP-BC, AGACNP-BC [facility]

## 2024-05-13 NOTE — Patient Instructions (Addendum)
 Continue taking MiraLAX  but I want you to be more consistent with taking this on a daily basis if it does not cause you to have frequent loose stool.  You should take 1 capful in at least 8 ounces of fluid of your choice.  If you have Benefiber tablets you may take 2 of these once a day and if after several weeks you are not having any significant bloating you may increase if tolerated or if you are doing well just stay with once a day fiber along with the MiraLAX .  If you want to continue the tablets that is fine however you may find that powder is more cost effective and you can easily mix it with your MiraLAX  powder if you wish.  Continue taking pantoprazole  40 mg once daily.  For breakthrough symptoms of reflux you may take Pepcid 10 or 20 mg as needed for more long-term relief rather than Tums.  If you find that your reflux is becoming more frequent please feel free to reach out and we can consider increasing your pantoprazole  to 40 mg twice daily briefly.  We will plan to see you in January for follow-up and discuss scheduling your colonoscopy at that time.  It was a pleasure to see you today. I want to create trusting relationships with patients. If you receive a survey regarding your visit,  I greatly appreciate you taking time to fill this out on paper or through your MyChart. I value your feedback.  Charmaine Melia, MSN, FNP-BC, AGACNP-BC [facility]

## 2024-05-14 ENCOUNTER — Ambulatory Visit: Admitting: Gastroenterology

## 2024-05-14 ENCOUNTER — Other Ambulatory Visit: Payer: Self-pay

## 2024-05-15 ENCOUNTER — Telehealth: Payer: Self-pay | Admitting: Internal Medicine

## 2024-05-15 NOTE — Telephone Encounter (Signed)
 Scheduled appointment per 8/5 los. Talked with the patient and he is aware of the made appointment.

## 2024-05-16 ENCOUNTER — Other Ambulatory Visit: Payer: Self-pay

## 2024-05-18 ENCOUNTER — Inpatient Hospital Stay

## 2024-05-18 ENCOUNTER — Inpatient Hospital Stay (HOSPITAL_BASED_OUTPATIENT_CLINIC_OR_DEPARTMENT_OTHER): Admitting: Internal Medicine

## 2024-05-18 VITALS — BP 163/91 | HR 96 | Temp 97.2°F | Resp 17 | Ht 71.5 in | Wt 189.0 lb

## 2024-05-18 DIAGNOSIS — F17210 Nicotine dependence, cigarettes, uncomplicated: Secondary | ICD-10-CM | POA: Diagnosis not present

## 2024-05-18 DIAGNOSIS — Z79899 Other long term (current) drug therapy: Secondary | ICD-10-CM | POA: Diagnosis not present

## 2024-05-18 DIAGNOSIS — E230 Hypopituitarism: Secondary | ICD-10-CM | POA: Diagnosis not present

## 2024-05-18 DIAGNOSIS — Z5112 Encounter for antineoplastic immunotherapy: Secondary | ICD-10-CM | POA: Diagnosis not present

## 2024-05-18 DIAGNOSIS — C3491 Malignant neoplasm of unspecified part of right bronchus or lung: Secondary | ICD-10-CM | POA: Diagnosis not present

## 2024-05-18 DIAGNOSIS — Z7952 Long term (current) use of systemic steroids: Secondary | ICD-10-CM | POA: Diagnosis not present

## 2024-05-18 DIAGNOSIS — C3411 Malignant neoplasm of upper lobe, right bronchus or lung: Secondary | ICD-10-CM | POA: Diagnosis not present

## 2024-05-18 DIAGNOSIS — C7951 Secondary malignant neoplasm of bone: Secondary | ICD-10-CM | POA: Diagnosis not present

## 2024-05-18 DIAGNOSIS — C7931 Secondary malignant neoplasm of brain: Secondary | ICD-10-CM | POA: Diagnosis not present

## 2024-05-18 DIAGNOSIS — Z5111 Encounter for antineoplastic chemotherapy: Secondary | ICD-10-CM | POA: Diagnosis not present

## 2024-05-18 LAB — CMP (CANCER CENTER ONLY)
ALT: 19 U/L (ref 0–44)
AST: 22 U/L (ref 15–41)
Albumin: 4 g/dL (ref 3.5–5.0)
Alkaline Phosphatase: 64 U/L (ref 38–126)
Anion gap: 8 (ref 5–15)
BUN: 19 mg/dL (ref 8–23)
CO2: 25 mmol/L (ref 22–32)
Calcium: 8.9 mg/dL (ref 8.9–10.3)
Chloride: 108 mmol/L (ref 98–111)
Creatinine: 0.99 mg/dL (ref 0.61–1.24)
GFR, Estimated: >60 mL/min (ref >60)
Glucose, Bld: 113 mg/dL — ABNORMAL HIGH (ref 70–99)
Potassium: 4 mmol/L (ref 3.5–5.1)
Sodium: 141 mmol/L (ref 135–145)
Total Bilirubin: 0.3 mg/dL (ref 0.0–1.2)
Total Protein: 7 g/dL (ref 6.5–8.1)

## 2024-05-18 LAB — CBC WITH DIFFERENTIAL (CANCER CENTER ONLY)
Abs Immature Granulocytes: 0.05 K/uL (ref 0.00–0.07)
Basophils Absolute: 0.1 K/uL (ref 0.0–0.1)
Basophils Relative: 1 %
Eosinophils Absolute: 0.1 K/uL (ref 0.0–0.5)
Eosinophils Relative: 1 %
HCT: 37.2 % — ABNORMAL LOW (ref 39.0–52.0)
Hemoglobin: 11 g/dL — ABNORMAL LOW (ref 13.0–17.0)
Immature Granulocytes: 1 %
Lymphocytes Relative: 24 %
Lymphs Abs: 2.2 K/uL (ref 0.7–4.0)
MCH: 25 pg — ABNORMAL LOW (ref 26.0–34.0)
MCHC: 29.6 g/dL — ABNORMAL LOW (ref 30.0–36.0)
MCV: 84.5 fL (ref 80.0–100.0)
Monocytes Absolute: 0.6 K/uL (ref 0.1–1.0)
Monocytes Relative: 6 %
Neutro Abs: 6.2 K/uL (ref 1.7–7.7)
Neutrophils Relative %: 67 %
Platelet Count: 347 K/uL (ref 150–400)
RBC: 4.4 MIL/uL (ref 4.22–5.81)
RDW: 19.9 % — ABNORMAL HIGH (ref 11.5–15.5)
WBC Count: 9.2 K/uL (ref 4.0–10.5)
nRBC: 0 % (ref 0.0–0.2)

## 2024-05-18 MED ORDER — SODIUM CHLORIDE 0.9 % IV SOLN
200.0000 mg | Freq: Once | INTRAVENOUS | Status: AC
  Administered 2024-05-18 (×2): 200 mg via INTRAVENOUS
  Filled 2024-05-18: qty 200

## 2024-05-18 MED ORDER — SODIUM CHLORIDE 0.9 % IV SOLN
Freq: Once | INTRAVENOUS | Status: AC
Start: 2024-05-18 — End: 2024-05-18

## 2024-05-18 MED ORDER — PROCHLORPERAZINE MALEATE 10 MG PO TABS
10.0000 mg | ORAL_TABLET | Freq: Once | ORAL | Status: AC
  Administered 2024-05-18 (×2): 10 mg via ORAL
  Filled 2024-05-18: qty 1

## 2024-05-18 MED ORDER — SODIUM CHLORIDE 0.9 % IV SOLN
500.0000 mg/m2 | Freq: Once | INTRAVENOUS | Status: AC
  Administered 2024-05-18 (×2): 1000 mg via INTRAVENOUS
  Filled 2024-05-18: qty 40

## 2024-05-18 NOTE — Patient Instructions (Signed)
 [facility] - A DEPT OF [facility]  Discharge Instructions: Thank you for choosing [facility] to provide your oncology and hematology care.   If you have a lab appointment with the Cancer Center, please go directly to the Cancer Center and check in at the registration area.   Wear comfortable clothing and clothing appropriate for easy access to any Portacath or PICC line.   We strive to give you quality time with your provider. You may need to reschedule your appointment if you arrive late (15 or more minutes).  Arriving late affects you and other patients whose appointments are after yours.  Also, if you miss three or more appointments without notifying the office, you may be dismissed from the clinic at the provider's discretion.      For prescription refill requests, have your pharmacy contact our office and allow 72 hours for refills to be completed.    Today you received the following chemotherapy and/or immunotherapy agents: Keytruda/Alimta      To help prevent nausea and vomiting after your treatment, we encourage you to take your nausea medication as directed.  BELOW ARE SYMPTOMS THAT SHOULD BE REPORTED IMMEDIATELY: *FEVER GREATER THAN 100.4 F (38 C) OR HIGHER *CHILLS OR SWEATING *NAUSEA AND VOMITING THAT IS NOT CONTROLLED WITH YOUR NAUSEA MEDICATION *UNUSUAL SHORTNESS OF BREATH *UNUSUAL BRUISING OR BLEEDING *URINARY PROBLEMS (pain or burning when urinating, or frequent urination) *BOWEL PROBLEMS (unusual diarrhea, constipation, pain near the anus) TENDERNESS IN MOUTH AND THROAT WITH OR WITHOUT PRESENCE OF ULCERS (sore throat, sores in mouth, or a toothache) UNUSUAL RASH, SWELLING OR PAIN  UNUSUAL VAGINAL DISCHARGE OR ITCHING   Items with * indicate a potential emergency and should be followed up as soon as possible or go to the Emergency Department if any problems should occur.  Please show the CHEMOTHERAPY ALERT CARD or  IMMUNOTHERAPY ALERT CARD at check-in to the Emergency Department and triage nurse.  Should you have questions after your visit or need to cancel or reschedule your appointment, please contact [facility] - A DEPT [facility]  Dept: [phone]  and follow the prompts.  Office hours are 8:00 a.m. to 4:30 p.m. Monday - Friday. Please note that voicemails left after 4:00 p.m. may not be returned until the following business day.  We are closed weekends and major holidays. You have access to a nurse at all times for urgent questions. Please call the main number to the clinic Dept: [phone] and follow the prompts.   For any non-urgent questions, you may also contact your provider using MyChart. We now offer e-Visits for anyone 6 and older to request care online for non-urgent symptoms. For details visit [email or web address].   Also download the MyChart app! Go to the app store, search "MyChart", open the app, select [place], and log in with your MyChart username and password.

## 2024-05-18 NOTE — Progress Notes (Signed)
 [facility] [phone]   [phone]  OFFICE PROGRESS NOTE  Bradley [name], FNP [address] [place] [address]  DIAGNOSIS: Stage IV (T1b, N3, M1C) non-small cell lung cancer, favoring adenocarcinoma presented with right upper lobe lung nodule in addition to right hilar, subcarinal and bilateral mediastinal as well as supraclavicular lymphadenopathy in addition to bone and brain metastasis diagnosed in June 2022.     PD-L1 expression 80%.     Molecular Studies:  Biomarker Findings Microsatellite status - MS-Stable Tumor Mutational Burden - 6 Muts/Mb Genomic Findings For a complete list of the genes assayed, please refer to the Appendix. KRAS G12C, amplification ATM S470* CCND1 amplification - equivocal? HGF amplification - equivocal? MYC amplification - equivocal? FGF19 amplification - equivocal? FGF3 amplification - equivocal? FGF4 amplification - equivocal? NFKBIA amplification NKX2-1 amplification RAD21 amplification - equivocal? [id number]*26 TERT promoter -124C>T TP53 rearrangement exon 9 7 Disease relevant genes with no reportable alterations: ALK, BRAF, EGFR, ERBB2, MET, RET, ROS1   PRIOR THERAPY: SRS to the metastatic brain lesions under the care of Dr. Patrcia.  Last treatment on 05/04/2021.   CURRENT THERAPY: Palliative systemic chemotherapy with carboplatin  for an AUC 5, Alimta  500 mg/m2 and, Keytruda  200 mg IV every 3 weeks.  First dose on 05/08/2021.  Status post 51 cycles.  Starting from cycle #5 he is on maintenance treatment with Alimta  and Keytruda  every 3 weeks.  INTERVAL HISTORY: Bradley Goodman 68 y.o. male returns to the clinic today for follow-up visit accompanied by his wife.Discussed the use of AI scribe software for clinical note transcription with the patient, who gave verbal consent to proceed.  History of Present Illness Bradley Goodman is a 68 year old male with stage four non-small cell lung  cancer who presents for cycle 52 of chemotherapy treatment.  He was diagnosed with stage four non-small cell lung cancer, adenocarcinoma, in June 2022. 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 He is currently undergoing chemotherapy and is on cycle number 52. He feels good with no new complaints and states that his condition is 'still the same as before'.  He has undergone stereotactic radiosurgery [facility]) for a metastatic brain lesion.  There is a family history that may be relevant due to the ATM mutation. He has a daughter and a son.  No gastrointestinal complaints at this time.     MEDICAL HISTORY: 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   PONV (postoperative nausea and vomiting)    Type 2 diabetes mellitus (HCC)     ALLERGIES:  has no known allergies.  MEDICATIONS:  Current Outpatient Medications  Medication Sig Dispense Refill   acetaminophen  (TYLENOL ) 500 MG tablet Take 1,000 mg by mouth every 6 (six) hours as needed for mild pain (pain score 1-3) or fever.     chlorproMAZINE  (THORAZINE ) 10 MG tablet Take 1 tablet (10 mg total) by mouth 3 (three) times daily as needed for hiccoughs. 90 tablet 0   cholecalciferol  (VITAMIN D ) 1000 UNITS tablet Take 1,000 Units by mouth every evening.     Continuous Glucose Sensor (FREESTYLE LIBRE 3 PLUS SENSOR) MISC Change sensor every 15 days. 1 each 11  dexamethasone  (DECADRON ) 1 MG tablet Take 1 tablet (1 mg total) by mouth daily with breakfast. 90 tablet 1   folic acid  (FOLVITE ) 1 MG tablet Take 1 tablet (1 mg total) by mouth daily. 90 tablet 0   Krill Oil 300 MG CAPS Take 300 mg by mouth every evening.     levothyroxine  (SYNTHROID ) 100 MCG tablet Take 1 tablet (100 mcg total) by mouth daily before  breakfast. 90 tablet 1   loratadine  (CLARITIN ) 10 MG tablet Take 10 mg by mouth every evening.      losartan  (COZAAR ) 50 MG tablet Take 1.5 tablets (75 mg total) by mouth daily. 135 tablet 0   mirtazapine  (REMERON ) 15 MG tablet Take 1 tablet (15 mg total) by mouth at bedtime. 90 tablet 1   Multiple Vitamins-Minerals (MULTIVITAMIN GUMMIES ADULT PO) Take 2 tablets by mouth every evening.     ondansetron  (ZOFRAN ) 4 MG tablet Take 1 tablet (4 mg total) by mouth every 6 (six) hours as needed for nausea. 20 tablet 0   pantoprazole  (PROTONIX ) 40 MG tablet Take 1 tablet (40 mg total) by mouth every evening. 90 tablet 0   polyethylene glycol powder (GLYCOLAX /MIRALAX ) 17 GM/SCOOP powder Take 119 g by mouth daily as needed for mild constipation or moderate constipation.     rosuvastatin  (CRESTOR ) 20 MG tablet Take 1 tablet (20 mg total) by mouth daily. 90 tablet 0   SYRINGE-NEEDLE, DISP, 3 ML (B-D 3CC LUER-LOK SYR 21GX1-1/2) 21G X 1-1/2 3 ML MISC Use to inject testosterone  every week 100 each 2   testosterone  cypionate (DEPOTESTOTERONE CYPIONATE) 100 MG/ML injection Take 50mg  ( 0.48ml) and 100mg  ( 1ml) into muscle alternatively weekly. 10 mL 1   venlafaxine  XR (EFFEXOR -XR) 75 MG 24 hr capsule Take 2 capsules (150 mg total) by mouth daily with breakfast. 180 capsule 2   No current facility-administered medications for this visit.    SURGICAL HISTORY:  Past Surgical History:  Procedure Laterality Date   Bilateral inguinal hernia repair     BRONCHIAL NEEDLE ASPIRATION BIOPSY  04/05/2021   Procedure: BRONCHIAL NEEDLE ASPIRATION BIOPSIES;  Surgeon: Brenna Adine CROME, DO;  Location: [facility];  Service: Pulmonary;;   COLONOSCOPY  01/23/2012   Procedure: COLONOSCOPY;  Surgeon: Lamar CHRISTELLA Hollingshead, MD;  Location: [facility];  Service: Endoscopy;  Laterality: N/A;  9:30 AM   COLONOSCOPY N/A 10/11/2015   Procedure: COLONOSCOPY;  Surgeon: Lamar CHRISTELLA Hollingshead, MD;  Location: [facility];  Service: Endoscopy;  Laterality:  N/A;  830   COLONOSCOPY N/A 10/14/2019   Procedure: COLONOSCOPY;  Surgeon: Hollingshead Lamar CHRISTELLA, MD;  Location: [facility];  Service: Endoscopy;  Laterality: N/A;  1:45   POLYPECTOMY  10/14/2019   Procedure: POLYPECTOMY;  Surgeon: Hollingshead Lamar CHRISTELLA, MD;  Location: [facility];  Service: Endoscopy;;  ascending colon, descending colon   VIDEO BRONCHOSCOPY WITH ENDOBRONCHIAL ULTRASOUND N/A 04/05/2021   Procedure: VIDEO BRONCHOSCOPY WITH ENDOBRONCHIAL ULTRASOUND;  Surgeon: Brenna Adine CROME, DO;  Location: [facility];  Service: Pulmonary;  Laterality: N/A;    REVIEW OF SYSTEMS:  A comprehensive review of systems was negative except for: Constitutional: positive for fatigue   PHYSICAL EXAMINATION: General appearance: alert, cooperative, appears stated age, fatigued, and no distress Head: Normocephalic, without obvious abnormality, atraumatic Neck: no adenopathy, no JVD, supple, symmetrical, trachea midline, and thyroid  not enlarged, symmetric, no tenderness/mass/nodules Lymph nodes: Cervical, supraclavicular, and axillary nodes normal. Resp: clear to auscultation bilaterally Back: symmetric, no curvature. ROM normal. No CVA tenderness. Cardio: regular  rate and rhythm, S1, S2 normal, no murmur, click, rub or gallop GI: soft, non-tender; bowel sounds normal; no masses,  no organomegaly Extremities: extremities normal, atraumatic, no cyanosis or edema  ECOG PERFORMANCE STATUS: 1 - Symptomatic but completely ambulatory  Blood pressure (!) 163/91, pulse 96, temperature (!) 97.2 F (36.2 C), temperature source Temporal, resp. rate 17, height 5' 11.5 (1.816 m), weight 189 lb (85.7 kg), SpO2 100%.  LABORATORY DATA: Lab Results  Component Value Date   WBC 8.7 04/27/2024   HGB 11.8 (L) 04/27/2024   HCT 39.5 04/27/2024   MCV 84.2 04/27/2024   PLT 354 04/27/2024      Chemistry      Component Value Date/Time   NA 143 04/27/2024 1103   NA 145 (H) 04/22/2024 0917   K 3.9 04/27/2024 1103   CL 105  04/27/2024 1103   CO2 31 04/27/2024 1103   BUN 17 04/27/2024 1103   BUN 10 04/22/2024 0917   CREATININE 0.99 04/27/2024 1103      Component Value Date/Time   CALCIUM  9.3 04/27/2024 1103   ALKPHOS 70 04/27/2024 1103   AST 22 04/27/2024 1103   ALT 22 04/27/2024 1103   BILITOT 0.3 04/27/2024 1103       RADIOGRAPHIC STUDIES: MR BRAIN W WO CONTRAST Result Date: 05/07/2024 CLINICAL DATA:  CNS neoplasm, assess treatment response. Malignant neoplasm metastatic to brain. EXAM: MRI HEAD WITHOUT AND WITH CONTRAST TECHNIQUE: Multiplanar, multiecho pulse sequences of the brain and surrounding structures were obtained without and with intravenous contrast. CONTRAST:  8 mL of Vueway  intravenous contrast COMPARISON:  MRI head 02/13/2024 and earlier. FINDINGS: Brain: Enhancing lesion in the right parietal lobe cortex measures 3.5 mm, unchanged since 02/13/2024 (series 14, image 113). Additional punctate lesion in the left cerebellum measures approximately 2 mm, similar to prior (series 14, image 70). There are no new or enlarging intracranial lesions on the current study. No significant vasogenic edema. No mass effect or midline shift. Previously noted focus of enhancement in the left frontal lobe is not appreciated on the current study. No acute infarct. No evidence of intracranial hemorrhage. T2/FLAIR hyperintensity in the periventricular and subcortical white matter. The basilar cisterns are patent. No extra-axial fluid collections. Ventricles: Normal size and configuration of the ventricles. Vascular: Skull base flow voids are visualized. Skull and upper cervical spine: No focal abnormality. Sinuses/Orbits: Orbits are symmetric. Mild mucosal thickening in the ethmoid sinuses. Other: Right mastoid effusion similar to prior. IMPRESSION: Similar appearance of punctate enhancement in the left cerebellum. Additional 3.5 mm enhancing lesion in the right parietal cortex is unchanged. Previously noted focus of  enhancement in the left frontal lobe is not appreciated on the current study. No new or enlarging intracranial lesions. Chronic microvascular ischemic changes. Right mastoid effusion. Electronically Signed   By: Donnice Mania M.D.   On: 05/07/2024 13:35   CT CHEST ABDOMEN PELVIS W CONTRAST Result Date: 04/21/2024 CLINICAL DATA:  Non-small cell lung cancer, staging. * Tracking Code: BO * EXAM: CT CHEST, ABDOMEN, AND PELVIS WITH CONTRAST TECHNIQUE: Multidetector CT imaging of the chest, abdomen and pelvis was performed following the standard protocol during bolus administration of intravenous contrast. RADIATION DOSE REDUCTION: This exam was performed according to the departmental dose-optimization program which includes automated exposure control, adjustment of the mA and/or kV according to patient size and/or use of iterative reconstruction technique. CONTRAST:  OMNIPAQUE  IOHEXOL  300 MG/ML  SOLN COMPARISON:  Multiple priors including CT January 27, 2024 and January 24, 2024. FINDINGS:  CT CHEST FINDINGS Cardiovascular: Aortic atherosclerosis. Normal size heart. Trace pericardial effusion. Mediastinum/Nodes: No suspicious thyroid  nodule. Stable prominent mediastinal lymph nodes. No pathologically enlarged mediastinal, hilar or axillary lymph nodes. Esophagus is grossly unremarkable. Lungs/Pleura: Stable scattered bilateral pulmonary nodules/nodularity. For reference: -6 mm nodule in the posterior right upper lobe on image 47/4, unchanged -3 mm nodule in the anterior left upper lobe on image 58/4, unchanged. -irregular nodularity in the right upper lobe measuring 7 mm on image 65/4, unchanged. -6 mm ground-glass nodule in the central right upper lobe on image 57/4, unchanged. -5 mm ground-glass nodule in the peripheral left upper lobe on image 76/4, unchanged. No new suspicious pulmonary nodules or masses. Biapical pleuroparenchymal scarring. Paraseptal emphysema. Scattered atelectasis/scarring. Musculoskeletal:  Stable lucency with narrow zone of transition the posterior T6 vertebral body, favor benign. Stable tiny sclerotic focus in the T4 vertebral body on image 128/6. Stable sclerotic sclerotic foci in the ribs for instance in the right eleventh rib on image 149/5, and the right ninth rib on image 154/5 and in the left eighth rib on image 152/5 CT ABDOMEN PELVIS FINDINGS Hepatobiliary: Diffuse hepatic steatosis. Stable 6 mm hypodense too small to accurately characterize lesion in the inferior right lobe of the liver on image 74/2 and in the right lobe of the liver measuring 5 mm on image 59/2. Gallbladder is unremarkable.  No biliary ductal dilation. Pancreas: No pancreatic ductal dilation or evidence of acute inflammation. Spleen: No splenomegaly. Adrenals/Urinary Tract: No suspicious adrenal nodule/mass. Nonobstructive left renal stone measures 8 mm on image 70/2. No hydronephrosis. Kidneys demonstrate symmetric enhancement. Urinary bladder is unremarkable for degree of distension. Stomach/Bowel: Stomach is unremarkable for degree of distension. No pathologic dilation of small or large bowel. No evidence of acute bowel inflammation. Vascular/Lymphatic: Aortic atherosclerosis. No pathologically enlarged abdominal or pelvic lymph nodes. Reproductive: Dystrophic calcifications in the enlarged prostate gland. Other: No significant abdominopelvic free fluid. Musculoskeletal: No aggressive. IMPRESSION: 1. Stable scattered bilateral pulmonary nodules/nodularity. No new suspicious pulmonary nodules or masses. 2. Stable prominent mediastinal lymph nodes. 3. Stable sclerotic foci in the ribs and T6 vertebral body, nonspecific but favored benign. 4. Stable 6 mm hypodense too small to accurately characterize lesion in the inferior right lobe of the liver, favored benign. 5. Nonobstructive left renal stone measures 8 mm. 6. Diffuse hepatic steatosis. 7. Aortic Atherosclerosis (ICD10-I70.0) and Emphysema (ICD10-J43.9).  Electronically Signed   By: Reyes Holder M.D.   On: 04/21/2024 14:16      ASSESSMENT AND PLAN: This is a very pleasant 68 years old white male recently diagnosed with stage IV (T1b, N3, M1 C) non-small cell lung cancer favoring adenocarcinoma presented with right upper lobe lung nodule in addition to right hilar, subcarinal and bilateral mediastinal as well as supraclavicular lymphadenopathy.  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  His PD-L1 expression is 80% and his molecular studies showed KRAS G12C mutation. The patient underwent SRS to metastatic brain lesion under the care of Dr. Patrcia and he is currently undergoing systemic chemotherapy with carboplatin  for AUC of 5, Alimta  500 Mg/M2 and Keytruda  200 Mg IV every 3 weeks status post 51 cycles.  Starting from cycle #5 the patient will be treated with maintenance treatment with Alimta  and Keytruda  every 3 weeks.  The patient continues to tolerate this treatment fairly well. 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 Positive for KRAS G12C mutation. Status post stereotactic radiosurgery [facility]) to metastatic brain lesion. Currently on cycle 52 of treatment, with well-managed condition and no new complaints. Treatment is ongoing and appears effective. 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 Genetic counseling recommended to assess potential hereditary implications and risks for family members, including children. - Refer to genetic counseling for further evaluation and family risk assessment The patient was advised to call immediately if he has any concerning symptoms in the interval.  The  patient voices understanding of current disease status and treatment options and is in agreement with the current care plan.  All questions were answered. The patient knows to call the clinic with any problems, questions or concerns. We can certainly see the patient much sooner if necessary. The total time spent in the appointment was 20 minutes.  Disclaimer: This note was dictated with voice recognition software. Similar sounding words can inadvertently be transcribed and may not be corrected upon review.

## 2024-06-02 ENCOUNTER — Other Ambulatory Visit (HOSPITAL_COMMUNITY): Payer: Self-pay

## 2024-06-02 ENCOUNTER — Other Ambulatory Visit: Payer: Self-pay

## 2024-06-03 NOTE — Progress Notes (Signed)
 [facility] OFFICE PROGRESS NOTE  Bradley [name], FNP [address] [place] [address]  DIAGNOSIS: Stage IV (T1b, N3, M1C) non-small cell lung cancer, favoring adenocarcinoma presented with right upper lobe lung nodule in addition to right hilar, subcarinal and bilateral mediastinal as well as supraclavicular lymphadenopathy in addition to bone and brain metastasis diagnosed in June 2022.   PD-L1 expression 80%.     Molecular Studies:  Biomarker Findings Microsatellite status - MS-Stable Tumor Mutational Burden - 6 Muts/Mb Genomic Findings For a complete list of the genes assayed, please refer to the Appendix. KRAS G12C, amplification ATM S470* CCND1 amplification - equivocal? HGF amplification - equivocal? MYC amplification - equivocal? FGF19 amplification - equivocal? FGF3 amplification - equivocal? FGF4 amplification - equivocal? NFKBIA amplification NKX2-1 amplification RAD21 amplification - equivocal? [id number]*26 TERT promoter -124C>T TP53 rearrangement exon 9 7 Disease relevant genes with no reportable alterations: ALK, BRAF, EGFR, ERBB2, MET, RET, ROS1   PRIOR THERAPY: SRS to the metastatic brain lesions under the care of Dr. Patrcia.  Last treatment on 05/04/2021.    CURRENT THERAPY: Palliative systemic chemotherapy with carboplatin  for an AUC 5, Alimta  500 mg/m2 and, Keytruda  200 mg IV every 3 weeks.  First dose expected on 05/08/2021.  Status post 52 cycles.  Starting from cycle #5 he is on maintenance treatment with Alimta  and Keytruda  every 3 weeks.    INTERVAL HISTORY: Bradley Goodman 68 y.o. male returns to the clinic today for a follow-up visit accompanied by his wife. The patient was last seen by Dr. Sherrod 3 weeks ago. He is currently on treatment with Alimta  and keytruda .  He tolerates well except for some fatigue after treatment. He does still try to be active and plays golf several times per week. Today, he denies any fever,  chills, or night sweats. Denies any dyspnea, cough, chest pain, or hemoptysis.  Denies any nausea, vomiting, or diarrhea. He sometimes has constipation which he manages with miralax . He denies unusual headaches.  Denies rashes and skin changes except for easy bruising. He is on steroids for adrenal insufficiency. He is followed by endocrinology. He also has hypothyroidism and hypogonadism. He is here today for evaluation and repeat blood work before undergoing cycle #53     MEDICAL HISTORY: 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   PONV (postoperative nausea and vomiting)    Type 2 diabetes mellitus (HCC)     ALLERGIES:  has no known allergies.  MEDICATIONS:  Current Outpatient Medications  Medication Sig Dispense Refill   acetaminophen  (TYLENOL ) 500 MG tablet Take 1,000 mg by mouth every 6 (six) hours as needed for mild pain (pain score 1-3) or fever.     chlorproMAZINE  (THORAZINE ) 10 MG tablet Take 1 tablet (10 mg total) by mouth 3 (three) times daily as needed for hiccoughs. 90 tablet 0   cholecalciferol  (VITAMIN D ) 1000 UNITS tablet Take 1,000 Units by mouth every evening.     Continuous Glucose Sensor (FREESTYLE LIBRE 3 PLUS SENSOR) MISC Change sensor every 15 days. 1 each 11   dexamethasone  (DECADRON ) 1 MG tablet Take 1 tablet (1 mg total) by mouth daily with breakfast. 90 tablet 1   folic acid  (FOLVITE ) 1 MG tablet Take  1 tablet (1 mg total) by mouth daily. 90 tablet 0   Krill Oil 300 MG CAPS Take 300 mg by mouth every evening.     levothyroxine  (SYNTHROID ) 100 MCG tablet Take 1 tablet (100 mcg total) by mouth daily before breakfast. 90 tablet 1   loratadine  (CLARITIN ) 10 MG tablet Take 10 mg by mouth every evening.      losartan  (COZAAR ) 50 MG tablet Take 1.5 tablets  (75 mg total) by mouth daily. 135 tablet 0   mirtazapine  (REMERON ) 15 MG tablet Take 1 tablet (15 mg total) by mouth at bedtime. 90 tablet 1   Multiple Vitamins-Minerals (MULTIVITAMIN GUMMIES ADULT PO) Take 2 tablets by mouth every evening.     ondansetron  (ZOFRAN ) 4 MG tablet Take 1 tablet (4 mg total) by mouth every 6 (six) hours as needed for nausea. 20 tablet 0   pantoprazole  (PROTONIX ) 40 MG tablet Take 1 tablet (40 mg total) by mouth every evening. 90 tablet 0   polyethylene glycol powder (GLYCOLAX /MIRALAX ) 17 GM/SCOOP powder Take 119 g by mouth daily as needed for mild constipation or moderate constipation.     rosuvastatin  (CRESTOR ) 20 MG tablet Take 1 tablet (20 mg total) by mouth daily. 90 tablet 0   SYRINGE-NEEDLE, DISP, 3 ML (B-D 3CC LUER-LOK SYR 21GX1-1/2) 21G X 1-1/2 3 ML MISC Use to inject testosterone  every week 100 each 2   testosterone  cypionate (DEPOTESTOTERONE CYPIONATE) 100 MG/ML injection Take 50mg  ( 0.63ml) and 100mg  ( 1ml) into muscle alternatively weekly. 10 mL 1   venlafaxine  XR (EFFEXOR -XR) 75 MG 24 hr capsule Take 2 capsules (150 mg total) by mouth daily with breakfast. 180 capsule 2   No current facility-administered medications for this visit.    SURGICAL HISTORY:  Past Surgical History:  Procedure Laterality Date   Bilateral inguinal hernia repair     BRONCHIAL NEEDLE ASPIRATION BIOPSY  04/05/2021   Procedure: BRONCHIAL NEEDLE ASPIRATION BIOPSIES;  Surgeon: Brenna Adine CROME, DO;  Location: [facility];  Service: Pulmonary;;   COLONOSCOPY  01/23/2012   Procedure: COLONOSCOPY;  Surgeon: Lamar CHRISTELLA Hollingshead, MD;  Location: [facility];  Service: Endoscopy;  Laterality: N/A;  9:30 AM   COLONOSCOPY N/A 10/11/2015   Procedure: COLONOSCOPY;  Surgeon: Lamar CHRISTELLA Hollingshead, MD;  Location: [facility];  Service: Endoscopy;  Laterality: N/A;  830   COLONOSCOPY N/A 10/14/2019   Procedure: COLONOSCOPY;  Surgeon: Hollingshead Lamar CHRISTELLA, MD;  Location: [facility];  Service: Endoscopy;   Laterality: N/A;  1:45   POLYPECTOMY  10/14/2019   Procedure: POLYPECTOMY;  Surgeon: Hollingshead Lamar CHRISTELLA, MD;  Location: [facility];  Service: Endoscopy;;  ascending colon, descending colon   VIDEO BRONCHOSCOPY WITH ENDOBRONCHIAL ULTRASOUND N/A 04/05/2021   Procedure: VIDEO BRONCHOSCOPY WITH ENDOBRONCHIAL ULTRASOUND;  Surgeon: Brenna Adine CROME, DO;  Location: [facility];  Service: Pulmonary;  Laterality: N/A;    REVIEW OF SYSTEMS:   Review of Systems  Constitutional: Negative for appetite change, chills, fatigue, fever and unexpected weight change.  HENT:  Negative for mouth sores, nosebleeds, sore throat and trouble swallowing.   Eyes: Negative for eye problems and icterus.  Respiratory: Negative for cough, hemoptysis, shortness of breath and wheezing.   Cardiovascular: Negative for chest pain and leg swelling.  Gastrointestinal: Negative for abdominal pain, constipation, diarrhea, nausea and vomiting.  Genitourinary: Negative for bladder incontinence, difficulty urinating, dysuria, frequency and hematuria.   Musculoskeletal: Negative for back pain, gait problem, neck pain and neck stiffness.  Skin: Negative for itching  and rash.  Neurological: Negative for dizziness, extremity weakness, gait problem, headaches, light-headedness and seizures.  Hematological: Negative for adenopathy. Does not bruise/bleed easily.  Psychiatric/Behavioral: Negative for confusion, depression and sleep disturbance. The patient is not nervous/anxious.     PHYSICAL EXAMINATION:  There were no vitals taken for this visit.  ECOG PERFORMANCE STATUS: 1  Physical Exam  Constitutional: Oriented to person, place, and time and well-developed, well-nourished, and in no distress.  HENT:  Head: Normocephalic and atraumatic.  Mouth/Throat: Positive for poor dentition. Oropharynx is clear and moist. No oropharyngeal exudate.  Eyes: Conjunctivae are normal. Right eye exhibits no discharge. Left eye exhibits no discharge.  No scleral icterus.  Neck: Normal range of motion. Neck supple.  Cardiovascular: Normal rate, regular rhythm, normal heart sounds and intact distal pulses.   Pulmonary/Chest: Effort normal and breath sounds normal. No respiratory distress. No wheezes. No rales.  Abdominal: Soft. Bowel sounds are normal. Exhibits no distension and no mass. There is no tenderness.  Musculoskeletal: Normal range of motion. No swelling.  Lymphadenopathy:    No cervical adenopathy.  Neurological: Alert and oriented to person, place, and time. Exhibits normal muscle tone. Gait normal. Coordination normal.  Skin: Positive for upper extremity bruising. Venous stasis dermatitis on lower extremities. Skin is warm and dry. Not diaphoretic. No erythema. No pallor.  Psychiatric: Mood, memory and judgment normal.  Vitals reviewed.    LABORATORY DATA: Lab Results  Component Value Date   WBC 9.2 05/18/2024   HGB 11.0 (L) 05/18/2024   HCT 37.2 (L) 05/18/2024   MCV 84.5 05/18/2024   PLT 347 05/18/2024      Chemistry      Component Value Date/Time   NA 141 05/18/2024 0948   NA 145 (H) 04/22/2024 0917   K 4.0 05/18/2024 0948   CL 108 05/18/2024 0948   CO2 25 05/18/2024 0948   BUN 19 05/18/2024 0948   BUN 10 04/22/2024 0917   CREATININE 0.99 05/18/2024 0948      Component Value Date/Time   CALCIUM  8.9 05/18/2024 0948   ALKPHOS 64 05/18/2024 0948   AST 22 05/18/2024 0948   ALT 19 05/18/2024 0948   BILITOT 0.3 05/18/2024 0948       RADIOGRAPHIC STUDIES:  MR BRAIN W WO CONTRAST Result Date: 05/07/2024 CLINICAL DATA:  CNS neoplasm, assess treatment response. Malignant neoplasm metastatic to brain. EXAM: MRI HEAD WITHOUT AND WITH CONTRAST TECHNIQUE: Multiplanar, multiecho pulse sequences of the brain and surrounding structures were obtained without and with intravenous contrast. CONTRAST:  8 mL of Vueway  intravenous contrast COMPARISON:  MRI head 02/13/2024 and earlier. FINDINGS: Brain: Enhancing lesion in the  right parietal lobe cortex measures 3.5 mm, unchanged since 02/13/2024 (series 14, image 113). Additional punctate lesion in the left cerebellum measures approximately 2 mm, similar to prior (series 14, image 70). There are no new or enlarging intracranial lesions on the current study. No significant vasogenic edema. No mass effect or midline shift. Previously noted focus of enhancement in the left frontal lobe is not appreciated on the current study. No acute infarct. No evidence of intracranial hemorrhage. T2/FLAIR hyperintensity in the periventricular and subcortical white matter. The basilar cisterns are patent. No extra-axial fluid collections. Ventricles: Normal size and configuration of the ventricles. Vascular: Skull base flow voids are visualized. Skull and upper cervical spine: No focal abnormality. Sinuses/Orbits: Orbits are symmetric. Mild mucosal thickening in the ethmoid sinuses. Other: Right mastoid effusion similar to prior. IMPRESSION: Similar appearance of punctate enhancement in the  left cerebellum. Additional 3.5 mm enhancing lesion in the right parietal cortex is unchanged. Previously noted focus of enhancement in the left frontal lobe is not appreciated on the current study. No new or enlarging intracranial lesions. Chronic microvascular ischemic changes. Right mastoid effusion. Electronically Signed   By: Donnice Mania M.D.   On: 05/07/2024 13:35     ASSESSMENT/PLAN:  This is a very pleasant 68 year old Caucasian male diagnosed with stage IV (T1b, N3, M1 C) non-small cell lung cancer, favoring adenocarcinoma.  The patient presented with a right upper lobe lung nodule in addition to right hilar, subcarinal, and bilateral mediastinal lymphadenopathy as well as supraclavicular lymphadenopathy.  The patient also has metastatic disease to the brain.  He was diagnosed in June 2022.  His PD-L1 expression is 80%.  The patient's molecular studies show that he has a K-ras G12C mutation which can be  used for targeted treatment in the second line setting.    The patient completed SRS to the brain lesions under the care of Dr. Patrcia.  His last treatment was on 05/04/2021. 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     The patient is currently undergoing systemic chemotherapy with carboplatin  for an AUC of 5, Alimta  500 mg per metered squared, Keytruda  200 mg IV every 3 weeks.  Status post 52 cycles. Starting from cycle #5, the patient started maintenance Alimta  and Keytruda  IV every 3 weeks.     He is ok to treat with pulse of 103 bpm today.    Labs were reviewed.  Recommend that he proceed with cycle #53 today as scheduled.   We will see him back for follow-up visit in 3 weeks for evaluation repeat blood work before undergoing cycle #54.  The patient was advised to call immediately if she has any concerning symptoms in the interval. The patient voices understanding of current disease status and treatment options and is in agreement with the current care plan. All questions were answered. The patient knows to call the clinic with any problems, questions or concerns. We can certainly see the patient much sooner if necessary     No orders of the defined types were placed in this encounter.   The total time spent in the appointment was 20-29 minutes  Lucerito Rosinski L Corran Lalone, PA-C 06/03/24

## 2024-06-09 ENCOUNTER — Inpatient Hospital Stay

## 2024-06-09 ENCOUNTER — Inpatient Hospital Stay: Attending: Physician Assistant

## 2024-06-09 ENCOUNTER — Inpatient Hospital Stay (HOSPITAL_BASED_OUTPATIENT_CLINIC_OR_DEPARTMENT_OTHER): Admitting: Physician Assistant

## 2024-06-09 VITALS — BP 145/96 | HR 103 | Temp 97.3°F | Resp 17 | Ht 71.5 in | Wt 190.5 lb

## 2024-06-09 VITALS — HR 97

## 2024-06-09 DIAGNOSIS — E039 Hypothyroidism, unspecified: Secondary | ICD-10-CM | POA: Diagnosis not present

## 2024-06-09 DIAGNOSIS — Z7952 Long term (current) use of systemic steroids: Secondary | ICD-10-CM | POA: Insufficient documentation

## 2024-06-09 DIAGNOSIS — Z5112 Encounter for antineoplastic immunotherapy: Secondary | ICD-10-CM | POA: Diagnosis not present

## 2024-06-09 DIAGNOSIS — C7951 Secondary malignant neoplasm of bone: Secondary | ICD-10-CM | POA: Insufficient documentation

## 2024-06-09 DIAGNOSIS — C3491 Malignant neoplasm of unspecified part of right bronchus or lung: Secondary | ICD-10-CM | POA: Diagnosis not present

## 2024-06-09 DIAGNOSIS — E2740 Unspecified adrenocortical insufficiency: Secondary | ICD-10-CM | POA: Diagnosis not present

## 2024-06-09 DIAGNOSIS — K5900 Constipation, unspecified: Secondary | ICD-10-CM | POA: Insufficient documentation

## 2024-06-09 DIAGNOSIS — C7931 Secondary malignant neoplasm of brain: Secondary | ICD-10-CM | POA: Diagnosis not present

## 2024-06-09 DIAGNOSIS — C3411 Malignant neoplasm of upper lobe, right bronchus or lung: Secondary | ICD-10-CM | POA: Insufficient documentation

## 2024-06-09 DIAGNOSIS — Z5111 Encounter for antineoplastic chemotherapy: Secondary | ICD-10-CM | POA: Insufficient documentation

## 2024-06-09 DIAGNOSIS — E230 Hypopituitarism: Secondary | ICD-10-CM | POA: Insufficient documentation

## 2024-06-09 DIAGNOSIS — F17210 Nicotine dependence, cigarettes, uncomplicated: Secondary | ICD-10-CM | POA: Diagnosis not present

## 2024-06-09 LAB — CMP (CANCER CENTER ONLY)
ALT: 25 U/L (ref 0–44)
AST: 21 U/L (ref 15–41)
Albumin: 3.7 g/dL (ref 3.5–5.0)
Alkaline Phosphatase: 68 U/L (ref 38–126)
Anion gap: 8 (ref 5–15)
BUN: 17 mg/dL (ref 8–23)
CO2: 29 mmol/L (ref 22–32)
Calcium: 9.1 mg/dL (ref 8.9–10.3)
Chloride: 106 mmol/L (ref 98–111)
Creatinine: 0.96 mg/dL (ref 0.61–1.24)
GFR, Estimated: >60 mL/min (ref >60)
Glucose, Bld: 127 mg/dL — ABNORMAL HIGH (ref 70–99)
Potassium: 3.7 mmol/L (ref 3.5–5.1)
Sodium: 143 mmol/L (ref 135–145)
Total Bilirubin: 0.3 mg/dL (ref 0.0–1.2)
Total Protein: 6.7 g/dL (ref 6.5–8.1)

## 2024-06-09 LAB — CBC WITH DIFFERENTIAL (CANCER CENTER ONLY)
Abs Immature Granulocytes: 0.06 K/uL (ref 0.00–0.07)
Basophils Absolute: 0.1 K/uL (ref 0.0–0.1)
Basophils Relative: 1 %
Eosinophils Absolute: 0.1 K/uL (ref 0.0–0.5)
Eosinophils Relative: 1 %
HCT: 37.5 % — ABNORMAL LOW (ref 39.0–52.0)
Hemoglobin: 11.2 g/dL — ABNORMAL LOW (ref 13.0–17.0)
Immature Granulocytes: 1 %
Lymphocytes Relative: 18 %
Lymphs Abs: 1.6 K/uL (ref 0.7–4.0)
MCH: 25.2 pg — ABNORMAL LOW (ref 26.0–34.0)
MCHC: 29.9 g/dL — ABNORMAL LOW (ref 30.0–36.0)
MCV: 84.3 fL (ref 80.0–100.0)
Monocytes Absolute: 0.6 K/uL (ref 0.1–1.0)
Monocytes Relative: 7 %
Neutro Abs: 6.7 K/uL (ref 1.7–7.7)
Neutrophils Relative %: 72 %
Platelet Count: 355 K/uL (ref 150–400)
RBC: 4.45 MIL/uL (ref 4.22–5.81)
RDW: 20.5 % — ABNORMAL HIGH (ref 11.5–15.5)
WBC Count: 9.1 K/uL (ref 4.0–10.5)
nRBC: 0 % (ref 0.0–0.2)

## 2024-06-09 MED ORDER — SODIUM CHLORIDE 0.9 % IV SOLN
500.0000 mg/m2 | Freq: Once | INTRAVENOUS | Status: AC
  Administered 2024-06-09: 1000 mg via INTRAVENOUS
  Filled 2024-06-09: qty 40

## 2024-06-09 MED ORDER — SODIUM CHLORIDE 0.9 % IV SOLN: Freq: Once | INTRAVENOUS | Status: AC

## 2024-06-09 MED ORDER — PROCHLORPERAZINE MALEATE 10 MG PO TABS
10.0000 mg | ORAL_TABLET | Freq: Once | ORAL | Status: AC
  Administered 2024-06-09: 10 mg via ORAL
  Filled 2024-06-09: qty 1

## 2024-06-09 MED ORDER — SODIUM CHLORIDE 0.9 % IV SOLN
200.0000 mg | Freq: Once | INTRAVENOUS | Status: AC
  Administered 2024-06-09: 200 mg via INTRAVENOUS
  Filled 2024-06-09: qty 200

## 2024-06-09 NOTE — Patient Instructions (Signed)
 [facility] - A DEPT OF [facility]  Discharge Instructions: Thank you for choosing [facility] to provide your oncology and hematology care.   If you have a lab appointment with the Cancer Center, please go directly to the Cancer Center and check in at the registration area.   Wear comfortable clothing and clothing appropriate for easy access to any Portacath or PICC line.   We strive to give you quality time with your provider. You may need to reschedule your appointment if you arrive late (15 or more minutes).  Arriving late affects you and other patients whose appointments are after yours.  Also, if you miss three or more appointments without notifying the office, you may be dismissed from the clinic at the provider's discretion.      For prescription refill requests, have your pharmacy contact our office and allow 72 hours for refills to be completed.    Today you received the following chemotherapy and/or immunotherapy agents keytruda  and alimta        To help prevent nausea and vomiting after your treatment, we encourage you to take your nausea medication as directed.  BELOW ARE SYMPTOMS THAT SHOULD BE REPORTED IMMEDIATELY: *FEVER GREATER THAN 100.4 F (38 C) OR HIGHER *CHILLS OR SWEATING *NAUSEA AND VOMITING THAT IS NOT CONTROLLED WITH YOUR NAUSEA MEDICATION *UNUSUAL SHORTNESS OF BREATH *UNUSUAL BRUISING OR BLEEDING *URINARY PROBLEMS (pain or burning when urinating, or frequent urination) *BOWEL PROBLEMS (unusual diarrhea, constipation, pain near the anus) TENDERNESS IN MOUTH AND THROAT WITH OR WITHOUT PRESENCE OF ULCERS (sore throat, sores in mouth, or a toothache) UNUSUAL RASH, SWELLING OR PAIN  UNUSUAL VAGINAL DISCHARGE OR ITCHING   Items with * indicate a potential emergency and should be followed up as soon as possible or go to the Emergency Department if any problems should occur.  Please show the CHEMOTHERAPY ALERT CARD or  IMMUNOTHERAPY ALERT CARD at check-in to the Emergency Department and triage nurse.  Should you have questions after your visit or need to cancel or reschedule your appointment, please contact [facility] - A DEPT OF [facility]  Dept: [phone]  and follow the prompts.  Office hours are 8:00 a.m. to 4:30 p.m. Monday - Friday. Please note that voicemails left after 4:00 p.m. may not be returned until the following business day.  We are closed weekends and major holidays. You have access to a nurse at all times for urgent questions. Please call the main number to the clinic Dept: [phone] and follow the prompts.   For any non-urgent questions, you may also contact your provider using MyChart. We now offer e-Visits for anyone 33 and older to request care online for non-urgent symptoms. For details visit [email or web address].   Also download the MyChart app! Go to the app store, search MyChart, open the app, select [place], and log in with your MyChart username and password.

## 2024-06-10 ENCOUNTER — Encounter: Payer: Commercial Managed Care - PPO | Admitting: Family Medicine

## 2024-06-13 ENCOUNTER — Emergency Department (HOSPITAL_COMMUNITY)

## 2024-06-13 ENCOUNTER — Encounter (HOSPITAL_COMMUNITY): Payer: Self-pay

## 2024-06-13 ENCOUNTER — Inpatient Hospital Stay (HOSPITAL_COMMUNITY)
Admission: EM | Admit: 2024-06-13 | Discharge: 2024-06-16 | DRG: 872 | Disposition: A | Discharge status: Home / Self Care | Attending: Internal Medicine | Admitting: Internal Medicine

## 2024-06-13 ENCOUNTER — Other Ambulatory Visit: Payer: Self-pay

## 2024-06-13 DIAGNOSIS — F339 Major depressive disorder, recurrent, unspecified: Secondary | ICD-10-CM | POA: Diagnosis present

## 2024-06-13 DIAGNOSIS — Z79899 Other long term (current) drug therapy: Secondary | ICD-10-CM

## 2024-06-13 DIAGNOSIS — Z8249 Family history of ischemic heart disease and other diseases of the circulatory system: Secondary | ICD-10-CM

## 2024-06-13 DIAGNOSIS — N200 Calculus of kidney: Secondary | ICD-10-CM | POA: Diagnosis present

## 2024-06-13 DIAGNOSIS — D649 Anemia, unspecified: Secondary | ICD-10-CM | POA: Diagnosis present

## 2024-06-13 DIAGNOSIS — I080 Rheumatic disorders of both mitral and aortic valves: Secondary | ICD-10-CM | POA: Diagnosis present

## 2024-06-13 DIAGNOSIS — R0789 Other chest pain: Secondary | ICD-10-CM | POA: Diagnosis present

## 2024-06-13 DIAGNOSIS — J90 Pleural effusion, not elsewhere classified: Secondary | ICD-10-CM | POA: Diagnosis not present

## 2024-06-13 DIAGNOSIS — E876 Hypokalemia: Secondary | ICD-10-CM | POA: Diagnosis present

## 2024-06-13 DIAGNOSIS — C3411 Malignant neoplasm of upper lobe, right bronchus or lung: Secondary | ICD-10-CM | POA: Diagnosis present

## 2024-06-13 DIAGNOSIS — Z794 Long term (current) use of insulin: Secondary | ICD-10-CM

## 2024-06-13 DIAGNOSIS — G43909 Migraine, unspecified, not intractable, without status migrainosus: Secondary | ICD-10-CM | POA: Diagnosis present

## 2024-06-13 DIAGNOSIS — F17210 Nicotine dependence, cigarettes, uncomplicated: Secondary | ICD-10-CM | POA: Diagnosis present

## 2024-06-13 DIAGNOSIS — E271 Primary adrenocortical insufficiency: Secondary | ICD-10-CM | POA: Diagnosis not present

## 2024-06-13 DIAGNOSIS — E559 Vitamin D deficiency, unspecified: Secondary | ICD-10-CM | POA: Diagnosis present

## 2024-06-13 DIAGNOSIS — K219 Gastro-esophageal reflux disease without esophagitis: Secondary | ICD-10-CM | POA: Diagnosis not present

## 2024-06-13 DIAGNOSIS — I700 Atherosclerosis of aorta: Secondary | ICD-10-CM | POA: Diagnosis present

## 2024-06-13 DIAGNOSIS — C7951 Secondary malignant neoplasm of bone: Secondary | ICD-10-CM | POA: Diagnosis not present

## 2024-06-13 DIAGNOSIS — E8809 Other disorders of plasma-protein metabolism, not elsewhere classified: Secondary | ICD-10-CM | POA: Diagnosis not present

## 2024-06-13 DIAGNOSIS — E1159 Type 2 diabetes mellitus with other circulatory complications: Secondary | ICD-10-CM | POA: Diagnosis present

## 2024-06-13 DIAGNOSIS — E230 Hypopituitarism: Secondary | ICD-10-CM | POA: Diagnosis not present

## 2024-06-13 DIAGNOSIS — C7931 Secondary malignant neoplasm of brain: Secondary | ICD-10-CM | POA: Diagnosis not present

## 2024-06-13 DIAGNOSIS — R6510 Systemic inflammatory response syndrome (SIRS) of non-infectious origin without acute organ dysfunction: Secondary | ICD-10-CM | POA: Diagnosis present

## 2024-06-13 DIAGNOSIS — Z923 Personal history of irradiation: Secondary | ICD-10-CM

## 2024-06-13 DIAGNOSIS — K5900 Constipation, unspecified: Secondary | ICD-10-CM | POA: Diagnosis present

## 2024-06-13 DIAGNOSIS — E0969 Drug or chemical induced diabetes mellitus with other specified complication: Secondary | ICD-10-CM | POA: Diagnosis not present

## 2024-06-13 DIAGNOSIS — E1165 Type 2 diabetes mellitus with hyperglycemia: Secondary | ICD-10-CM | POA: Diagnosis not present

## 2024-06-13 DIAGNOSIS — R918 Other nonspecific abnormal finding of lung field: Secondary | ICD-10-CM | POA: Diagnosis present

## 2024-06-13 DIAGNOSIS — T380X5A Adverse effect of glucocorticoids and synthetic analogues, initial encounter: Secondary | ICD-10-CM | POA: Diagnosis present

## 2024-06-13 DIAGNOSIS — Z9889 Other specified postprocedural states: Secondary | ICD-10-CM

## 2024-06-13 DIAGNOSIS — C3491 Malignant neoplasm of unspecified part of right bronchus or lung: Secondary | ICD-10-CM | POA: Diagnosis present

## 2024-06-13 DIAGNOSIS — E441 Mild protein-calorie malnutrition: Secondary | ICD-10-CM | POA: Diagnosis present

## 2024-06-13 DIAGNOSIS — Z833 Family history of diabetes mellitus: Secondary | ICD-10-CM

## 2024-06-13 DIAGNOSIS — E1169 Type 2 diabetes mellitus with other specified complication: Secondary | ICD-10-CM | POA: Diagnosis present

## 2024-06-13 DIAGNOSIS — I10 Essential (primary) hypertension: Secondary | ICD-10-CM | POA: Diagnosis present

## 2024-06-13 DIAGNOSIS — A419 Sepsis, unspecified organism: Principal | ICD-10-CM

## 2024-06-13 DIAGNOSIS — I3139 Other pericardial effusion (noninflammatory): Secondary | ICD-10-CM | POA: Diagnosis present

## 2024-06-13 DIAGNOSIS — Z1152 Encounter for screening for COVID-19: Secondary | ICD-10-CM | POA: Diagnosis not present

## 2024-06-13 DIAGNOSIS — L03115 Cellulitis of right lower limb: Secondary | ICD-10-CM | POA: Diagnosis present

## 2024-06-13 DIAGNOSIS — E860 Dehydration: Secondary | ICD-10-CM | POA: Diagnosis present

## 2024-06-13 DIAGNOSIS — E2740 Unspecified adrenocortical insufficiency: Secondary | ICD-10-CM | POA: Diagnosis present

## 2024-06-13 DIAGNOSIS — Z860100 Personal history of colon polyps, unspecified: Secondary | ICD-10-CM

## 2024-06-13 DIAGNOSIS — E782 Mixed hyperlipidemia: Secondary | ICD-10-CM | POA: Diagnosis present

## 2024-06-13 DIAGNOSIS — Z87442 Personal history of urinary calculi: Secondary | ICD-10-CM

## 2024-06-13 DIAGNOSIS — R531 Weakness: Secondary | ICD-10-CM

## 2024-06-13 DIAGNOSIS — E0965 Drug or chemical induced diabetes mellitus with hyperglycemia: Secondary | ICD-10-CM | POA: Diagnosis present

## 2024-06-13 DIAGNOSIS — R109 Unspecified abdominal pain: Secondary | ICD-10-CM | POA: Diagnosis not present

## 2024-06-13 DIAGNOSIS — Z7969 Long term (current) use of other immunomodulators and immunosuppressants: Secondary | ICD-10-CM

## 2024-06-13 DIAGNOSIS — I959 Hypotension, unspecified: Secondary | ICD-10-CM | POA: Diagnosis not present

## 2024-06-13 DIAGNOSIS — M4302 Spondylolysis, cervical region: Secondary | ICD-10-CM | POA: Diagnosis present

## 2024-06-13 DIAGNOSIS — Z79890 Hormone replacement therapy (postmenopausal): Secondary | ICD-10-CM

## 2024-06-13 DIAGNOSIS — E785 Hyperlipidemia, unspecified: Secondary | ICD-10-CM | POA: Diagnosis not present

## 2024-06-13 DIAGNOSIS — R079 Chest pain, unspecified: Secondary | ICD-10-CM | POA: Diagnosis not present

## 2024-06-13 DIAGNOSIS — E46 Unspecified protein-calorie malnutrition: Secondary | ICD-10-CM

## 2024-06-13 DIAGNOSIS — K760 Fatty (change of) liver, not elsewhere classified: Secondary | ICD-10-CM | POA: Diagnosis not present

## 2024-06-13 DIAGNOSIS — E039 Hypothyroidism, unspecified: Secondary | ICD-10-CM | POA: Diagnosis present

## 2024-06-13 DIAGNOSIS — Z6826 Body mass index (BMI) 26.0-26.9, adult: Secondary | ICD-10-CM

## 2024-06-13 LAB — D-DIMER, QUANTITATIVE: D-Dimer, Quant: 1.67 ug{FEU}/mL — ABNORMAL HIGH (ref 0.00–0.50)

## 2024-06-13 LAB — CBC
HCT: 35.8 % — ABNORMAL LOW (ref 39.0–52.0)
Hemoglobin: 10.6 g/dL — ABNORMAL LOW (ref 13.0–17.0)
MCH: 25.2 pg — ABNORMAL LOW (ref 26.0–34.0)
MCHC: 29.6 g/dL — ABNORMAL LOW (ref 30.0–36.0)
MCV: 85.2 fL (ref 80.0–100.0)
Platelets: 320 K/uL (ref 150–400)
RBC: 4.2 MIL/uL — ABNORMAL LOW (ref 4.22–5.81)
RDW: 20.3 % — ABNORMAL HIGH (ref 11.5–15.5)
WBC: 11.4 K/uL — ABNORMAL HIGH (ref 4.0–10.5)
nRBC: 0.2 % (ref 0.0–0.2)

## 2024-06-13 LAB — RESP PANEL BY RT-PCR (RSV, FLU A&B, COVID)  RVPGX2
Influenza A by PCR: NEGATIVE
Influenza B by PCR: NEGATIVE
Resp Syncytial Virus by PCR: NEGATIVE
SARS Coronavirus 2 by RT PCR: NEGATIVE

## 2024-06-13 LAB — BASIC METABOLIC PANEL WITH GFR
Anion gap: 12 (ref 5–15)
BUN: 15 mg/dL (ref 8–23)
CO2: 24 mmol/L (ref 22–32)
Calcium: 9 mg/dL (ref 8.9–10.3)
Chloride: 101 mmol/L (ref 98–111)
Creatinine, Ser: 1.21 mg/dL (ref 0.61–1.24)
GFR, Estimated: >60 mL/min (ref >60)
Glucose, Bld: 126 mg/dL — ABNORMAL HIGH (ref 70–99)
Potassium: 3.3 mmol/L — ABNORMAL LOW (ref 3.5–5.1)
Sodium: 137 mmol/L (ref 135–145)

## 2024-06-13 LAB — HEPATIC FUNCTION PANEL
ALT: 23 U/L (ref 0–44)
AST: 26 U/L (ref 15–41)
Albumin: 3.1 g/dL — ABNORMAL LOW (ref 3.5–5.0)
Alkaline Phosphatase: 67 U/L (ref 38–126)
Bilirubin, Direct: 0.3 mg/dL — ABNORMAL HIGH (ref 0.0–0.2)
Indirect Bilirubin: 0.9 mg/dL (ref 0.3–0.9)
Total Bilirubin: 1.2 mg/dL (ref 0.0–1.2)
Total Protein: 6.6 g/dL (ref 6.5–8.1)

## 2024-06-13 LAB — LACTIC ACID, PLASMA: Lactic Acid, Venous: 1.3 mmol/L (ref 0.5–1.9)

## 2024-06-13 LAB — TROPONIN I (HIGH SENSITIVITY)
Troponin I (High Sensitivity): 7 ng/L (ref <18)
Troponin I (High Sensitivity): 6 ng/L (ref <18)

## 2024-06-13 LAB — LIPASE, BLOOD: Lipase: 27 U/L (ref 11–51)

## 2024-06-13 LAB — MAGNESIUM: Magnesium: 1.9 mg/dL (ref 1.7–2.4)

## 2024-06-13 MED ORDER — VANCOMYCIN HCL IN DEXTROSE 1-5 GM/200ML-% IV SOLN
1000.0000 mg | Freq: Once | INTRAVENOUS | Status: AC
  Administered 2024-06-13: 1000 mg via INTRAVENOUS
  Filled 2024-06-13: qty 200

## 2024-06-13 MED ORDER — SODIUM CHLORIDE 0.9 % IV BOLUS
1000.0000 mL | Freq: Once | INTRAVENOUS | Status: AC
  Administered 2024-06-13: 1000 mL via INTRAVENOUS

## 2024-06-13 MED ORDER — VITAMIN D 25 MCG (1000 UNIT) PO TABS
1000.0000 [IU] | ORAL_TABLET | Freq: Every evening | ORAL | Status: DC
  Administered 2024-06-14 – 2024-06-15 (×2): 1000 [IU] via ORAL
  Filled 2024-06-13 (×2): qty 1

## 2024-06-13 MED ORDER — POLYETHYLENE GLYCOL 3350 17 G PO PACK
17.0000 g | PACK | Freq: Every day | ORAL | Status: DC
  Administered 2024-06-14 – 2024-06-16 (×3): 17 g via ORAL
  Filled 2024-06-13 (×3): qty 1

## 2024-06-13 MED ORDER — LACTATED RINGERS IV BOLUS: 500.0000 mL | Freq: Once | INTRAVENOUS | Status: DC

## 2024-06-13 MED ORDER — IOHEXOL 350 MG/ML SOLN
75.0000 mL | Freq: Once | INTRAVENOUS | Status: AC | PRN
  Administered 2024-06-13: 75 mL via INTRAVENOUS

## 2024-06-13 MED ORDER — ACETAMINOPHEN 650 MG RE SUPP: 650.0000 mg | Freq: Four times a day (QID) | RECTAL | Status: DC | PRN

## 2024-06-13 MED ORDER — ENOXAPARIN SODIUM 40 MG/0.4ML IJ SOSY
40.0000 mg | PREFILLED_SYRINGE | INTRAMUSCULAR | Status: DC
  Administered 2024-06-13 – 2024-06-15 (×3): 40 mg via SUBCUTANEOUS
  Filled 2024-06-13 (×3): qty 0.4

## 2024-06-13 MED ORDER — ONDANSETRON HCL 4 MG PO TABS
4.0000 mg | ORAL_TABLET | Freq: Four times a day (QID) | ORAL | Status: DC | PRN
Start: 2024-06-13 — End: 2024-06-16

## 2024-06-13 MED ORDER — LEVOTHYROXINE SODIUM 100 MCG PO TABS
100.0000 ug | ORAL_TABLET | Freq: Every day | ORAL | Status: DC
  Administered 2024-06-14 – 2024-06-16 (×3): 100 ug via ORAL
  Filled 2024-06-13 (×3): qty 1

## 2024-06-13 MED ORDER — ADULT MULTIVITAMIN W/MINERALS CH
1.0000 | ORAL_TABLET | Freq: Every day | ORAL | Status: DC
  Administered 2024-06-13 – 2024-06-15 (×3): 1 via ORAL
  Filled 2024-06-13 (×3): qty 1

## 2024-06-13 MED ORDER — POTASSIUM CHLORIDE CRYS ER 20 MEQ PO TBCR
40.0000 meq | EXTENDED_RELEASE_TABLET | Freq: Once | ORAL | Status: AC
  Administered 2024-06-13: 40 meq via ORAL
  Filled 2024-06-13: qty 2

## 2024-06-13 MED ORDER — LOSARTAN POTASSIUM 50 MG PO TABS
75.0000 mg | ORAL_TABLET | Freq: Every day | ORAL | Status: DC
  Administered 2024-06-13: 75 mg via ORAL
  Filled 2024-06-13: qty 1

## 2024-06-13 MED ORDER — VENLAFAXINE HCL ER 75 MG PO CP24
150.0000 mg | ORAL_CAPSULE | Freq: Every day | ORAL | Status: DC
  Administered 2024-06-13 – 2024-06-15 (×3): 150 mg via ORAL
  Filled 2024-06-13 (×3): qty 2

## 2024-06-13 MED ORDER — ONDANSETRON HCL 4 MG/2ML IJ SOLN
4.0000 mg | Freq: Four times a day (QID) | INTRAMUSCULAR | Status: DC | PRN
  Administered 2024-06-16: 4 mg via INTRAVENOUS
  Filled 2024-06-13: qty 2

## 2024-06-13 MED ORDER — ENSURE PLUS HIGH PROTEIN PO LIQD
237.0000 mL | Freq: Two times a day (BID) | ORAL | Status: DC
  Administered 2024-06-15 (×2): 237 mL via ORAL
  Filled 2024-06-13 (×2): qty 237

## 2024-06-13 MED ORDER — PIPERACILLIN-TAZOBACTAM 3.375 G IVPB
3.3750 g | Freq: Three times a day (TID) | INTRAVENOUS | Status: DC
  Administered 2024-06-13 – 2024-06-15 (×5): 3.375 g via INTRAVENOUS
  Filled 2024-06-13 (×5): qty 50

## 2024-06-13 MED ORDER — MIRTAZAPINE 15 MG PO TABS
15.0000 mg | ORAL_TABLET | Freq: Every day | ORAL | Status: DC
  Administered 2024-06-13 – 2024-06-15 (×3): 15 mg via ORAL
  Filled 2024-06-13 (×3): qty 1

## 2024-06-13 MED ORDER — LACTATED RINGERS IV SOLN: INTRAVENOUS | Status: AC

## 2024-06-13 MED ORDER — PIPERACILLIN-TAZOBACTAM 3.375 G IVPB 30 MIN
3.3750 g | Freq: Once | INTRAVENOUS | Status: AC
  Administered 2024-06-13: 3.375 g via INTRAVENOUS
  Filled 2024-06-13: qty 50

## 2024-06-13 MED ORDER — ACETAMINOPHEN 325 MG PO TABS: 650.0000 mg | ORAL_TABLET | Freq: Four times a day (QID) | ORAL | Status: DC | PRN

## 2024-06-13 MED ORDER — INSULIN ASPART 100 UNIT/ML IJ SOLN
0.0000 [IU] | Freq: Three times a day (TID) | INTRAMUSCULAR | Status: DC
  Administered 2024-06-14: 1 [IU] via SUBCUTANEOUS
  Administered 2024-06-14: 2 [IU] via SUBCUTANEOUS
  Administered 2024-06-15: 1 [IU] via SUBCUTANEOUS
  Administered 2024-06-15: 2 [IU] via SUBCUTANEOUS
  Administered 2024-06-16: 1 [IU] via SUBCUTANEOUS

## 2024-06-13 MED ORDER — VANCOMYCIN HCL 1750 MG/350ML IV SOLN
1750.0000 mg | INTRAVENOUS | Status: DC
  Administered 2024-06-14 – 2024-06-16 (×3): 1750 mg via INTRAVENOUS
  Filled 2024-06-13 (×5): qty 350

## 2024-06-13 MED ORDER — ROSUVASTATIN CALCIUM 20 MG PO TABS
20.0000 mg | ORAL_TABLET | Freq: Every day | ORAL | Status: DC
  Administered 2024-06-13 – 2024-06-15 (×3): 20 mg via ORAL
  Filled 2024-06-13 (×3): qty 1

## 2024-06-13 MED ORDER — FOLIC ACID 1 MG PO TABS
1.0000 mg | ORAL_TABLET | Freq: Every day | ORAL | Status: DC
  Administered 2024-06-13 – 2024-06-15 (×3): 1 mg via ORAL
  Filled 2024-06-13 (×3): qty 1

## 2024-06-13 MED ORDER — PANTOPRAZOLE SODIUM 40 MG PO TBEC
40.0000 mg | DELAYED_RELEASE_TABLET | Freq: Every evening | ORAL | Status: DC
  Administered 2024-06-14 – 2024-06-15 (×2): 40 mg via ORAL
  Filled 2024-06-13 (×2): qty 1

## 2024-06-13 MED ORDER — FAMOTIDINE 20 MG PO TABS
10.0000 mg | ORAL_TABLET | Freq: Every day | ORAL | Status: DC
  Administered 2024-06-13 – 2024-06-15 (×3): 10 mg via ORAL
  Filled 2024-06-13 (×3): qty 1

## 2024-06-13 MED ORDER — DEXAMETHASONE 0.5 MG PO TABS
1.0000 mg | ORAL_TABLET | Freq: Every day | ORAL | Status: DC
  Filled 2024-06-13: qty 2

## 2024-06-13 NOTE — Progress Notes (Signed)
 Pt being followed by [facility] for Sepsis protocol.

## 2024-06-13 NOTE — ED Provider Notes (Signed)
 [place] [facility] AT [facility] Provider Note   CSN: [id number] Arrival date & time: 06/13/24  [id number]     Patient presents with: Chest Pain   Bradley Goodman is a 68 y.o. male.   Patient is a 68 year old male with past medical history of lung cancer who presents emergency department with a chief complaint of generalized weakness, chest tightness, chills which have been ongoing since yesterday.  Patient notes he has also been experiencing some constipation.  He denies any abdominal pain at this point but does note that he feels bloated.  He has had no vomiting or nausea.  Patient denies any true fever at home noting that the highest it got was 99.4.  Wife did give Tylenol  at home prior to arrival.  Patient notes that he has had no cough, congestion, rhinorrhea, sore throat.  Patient notes his most recent treatment for his cancer was 3 days ago.   Chest Pain      Prior to Admission medications   Medication Sig Start Date End Date Taking? Authorizing Provider  acetaminophen  (TYLENOL ) 500 MG tablet Take 1,000 mg by mouth every 6 (six) hours as needed for mild pain (pain score 1-3) or fever.    [provider]  chlorproMAZINE  (THORAZINE ) 10 MG tablet Take 1 tablet (10 mg total) by mouth 3 (three) times daily as needed for hiccoughs. 11/27/23   [name], FNP  cholecalciferol  (VITAMIN D ) 1000 UNITS tablet Take 1,000 Units by mouth every evening.    [provider]  Continuous Glucose Sensor (FREESTYLE LIBRE 3 PLUS SENSOR) MISC Change sensor every 15 days. 03/10/24   [name], FNP  dexamethasone  (DECADRON ) 1 MG tablet Take 1 tablet (1 mg total) by mouth daily with breakfast. 05/04/24   Nida, Gebreselassie W, MD  folic acid  (FOLVITE ) 1 MG tablet Take 1 tablet (1 mg total) by mouth daily. 04/03/24   [name], FNP  Krill Oil 300 MG CAPS Take 300 mg by mouth every evening.    [provider]  levothyroxine  (SYNTHROID ) 100 MCG tablet  Take 1 tablet (100 mcg total) by mouth daily before breakfast. 03/10/24   Nida, Gebreselassie W, MD  loratadine  (CLARITIN ) 10 MG tablet Take 10 mg by mouth every evening.     [provider]  losartan  (COZAAR ) 50 MG tablet Take 1.5 tablets (75 mg total) by mouth daily. 04/03/24   Jolinda Norene HERO, DO  mirtazapine  (REMERON ) 15 MG tablet Take 1 tablet (15 mg total) by mouth at bedtime. 04/22/24   [name], FNP  Multiple Vitamins-Minerals (MULTIVITAMIN GUMMIES ADULT PO) Take 2 tablets by mouth every evening.    [provider]  ondansetron  (ZOFRAN ) 4 MG tablet Take 1 tablet (4 mg total) by mouth every 6 (six) hours as needed for nausea. 01/24/24   Maree, Pratik D, DO  pantoprazole  (PROTONIX ) 40 MG tablet Take 1 tablet (40 mg total) by mouth every evening. 04/14/24   [name], FNP  polyethylene glycol powder (GLYCOLAX /MIRALAX ) 17 GM/SCOOP powder Take 119 g by mouth daily as needed for mild constipation or moderate constipation.    [provider]  rosuvastatin  (CRESTOR ) 20 MG tablet Take 1 tablet (20 mg total) by mouth daily. 04/14/24   [name], [name], FNP  SYRINGE-NEEDLE, DISP, 3 ML (B-D 3CC LUER-LOK SYR 21GX1-1/2) 21G X 1-1/2 3 ML MISC Use to inject testosterone  every week 03/10/24   Nida, Gebreselassie W, MD  testosterone  cypionate (DEPOTESTOTERONE CYPIONATE) 100  MG/ML injection Take 50mg  ( 0.88ml) and 100mg  ( 1ml) into muscle alternatively weekly. 03/10/24   Nida, Gebreselassie W, MD  venlafaxine  XR (EFFEXOR -XR) 75 MG 24 hr capsule Take 2 capsules (150 mg total) by mouth daily with breakfast. 04/22/24   [name], [name], FNP    Allergies: Patient has no known allergies.    Review of Systems  Constitutional:  Positive for chills.  Cardiovascular:  Positive for chest pain.  All other systems reviewed and are negative.   Updated Vital Signs BP 104/76   Pulse (!) 119   Temp 98.9 F (37.2 C) (Oral)   Resp 15   Ht 5' 11 (1.803 m)   Wt 86.2 kg   SpO2 93%   BMI 26.50  kg/m   Physical Exam Vitals and nursing note reviewed.  Constitutional:      General: He is not in acute distress.    Appearance: Normal appearance. He is not ill-appearing.  HENT:     Head: Normocephalic and atraumatic.     Nose: Nose normal.     Mouth/Throat:     Mouth: Mucous membranes are moist.  Eyes:     Extraocular Movements: Extraocular movements intact.     Conjunctiva/sclera: Conjunctivae normal.     Pupils: Pupils are equal, round, and reactive to light.  Cardiovascular:     Rate and Rhythm: Regular rhythm. Tachycardia present.     Pulses: Normal pulses.     Heart sounds: Normal heart sounds. Heart sounds not distant. No murmur heard. Pulmonary:     Effort: Pulmonary effort is normal. No tachypnea.     Breath sounds: Normal breath sounds. No decreased breath sounds, wheezing, rhonchi or rales.  Chest:     Chest wall: No tenderness.  Abdominal:     General: Abdomen is flat. Bowel sounds are normal.     Palpations: Abdomen is soft. There is no mass.     Tenderness: There is no abdominal tenderness.  Musculoskeletal:        General: Normal range of motion.     Cervical back: Normal range of motion and neck supple.     Right lower leg: No edema.     Left lower leg: No edema.  Skin:    General: Skin is warm and dry.  Neurological:     General: No focal deficit present.     Mental Status: He is alert and oriented to person, place, and time. Mental status is at baseline.  Psychiatric:        Mood and Affect: Mood normal.        Behavior: Behavior normal.        Thought Content: Thought content normal.        Judgment: Judgment normal.     (all labs ordered are listed, but only abnormal results are displayed) Labs Reviewed  BASIC METABOLIC PANEL WITH GFR - Abnormal; Notable for the following components:      Result Value   Potassium 3.3 (*)    Glucose, Bld 126 (*)    All other components within normal limits  CBC - Abnormal; Notable for the following  components:   WBC 11.4 (*)    RBC 4.20 (*)    Hemoglobin 10.6 (*)    HCT 35.8 (*)    MCH 25.2 (*)    MCHC 29.6 (*)    RDW 20.3 (*)    All other components within normal limits  HEPATIC FUNCTION PANEL - Abnormal; Notable for the following components:  Albumin 3.1 (*)    Bilirubin, Direct 0.3 (*)    All other components within normal limits  D-DIMER, QUANTITATIVE - Abnormal; Notable for the following components:   D-Dimer, Quant 1.67 (*)    All other components within normal limits  CULTURE, BLOOD (ROUTINE X 2)  CULTURE, BLOOD (ROUTINE X 2)  RESP PANEL BY RT-PCR (RSV, FLU A&B, COVID)  RVPGX2  LACTIC ACID, PLASMA  LIPASE, BLOOD  MAGNESIUM   LACTIC ACID, PLASMA  TROPONIN I (HIGH SENSITIVITY)  TROPONIN I (HIGH SENSITIVITY)    EKG: EKG Interpretation Date/Time:  Saturday June 13 2024 16:46:27 EDT Ventricular Rate:  129 PR Interval:  124 QRS Duration:  86 QT Interval:  322 QTC Calculation: 471 R Axis:   57  Text Interpretation: Sinus tachycardia Nonspecific T wave abnormality Abnormal ECG When compared with ECG of 01-Mar-2024 07:47, Nonspecific T wave abnormality, worse in Inferior leads Nonspecific T wave abnormality now evident in Lateral leads Confirmed by Cleotilde Rogue ([id number]) on 06/13/2024 4:53:29 PM  Radiology: [facility] Abdomen 1 View Result Date: 06/13/2024 CLINICAL DATA:  Constipation.  Chest tightness. EXAM: ABDOMEN - 1 VIEW COMPARISON:  None Available. FINDINGS: There is a non obstructive bowel gas pattern. No supine evidence of free air. No organomegaly or suspicious calcification. No acute bony abnormality. Moderate stool burden. IMPRESSION: Moderate stool burden.  No acute findings. Electronically Signed   By: Franky Crease M.D.   On: 06/13/2024 17:41   DG Chest 2 View Result Date: 06/13/2024 CLINICAL DATA:  Chest pain EXAM: CHEST - 2 VIEW COMPARISON:  02/29/2024 FINDINGS: Heart and mediastinal contours are within normal limits. No focal opacities or effusions. No acute  bony abnormality. IMPRESSION: No active cardiopulmonary disease. Electronically Signed   By: Franky Crease M.D.   On: 06/13/2024 17:41     Procedures   Medications Ordered in the ED  potassium chloride  SA (KLOR-CON  M) CR tablet 40 mEq (has no administration in time range)  iohexol  (OMNIPAQUE ) 350 MG/ML injection 75 mL (has no administration in time range)  sodium chloride  0.9 % bolus 1,000 mL (1,000 mLs Intravenous New Bag/Given 06/13/24 1712)  sodium chloride  0.9 % bolus 1,000 mL (1,000 mLs Intravenous New Bag/Given 06/13/24 1712)  piperacillin -tazobactam (ZOSYN ) IVPB 3.375 g (0 g Intravenous Stopped 06/13/24 1826)                                    Medical Decision Making Amount and/or Complexity of Data Reviewed Labs: ordered. Radiology: ordered.  Risk Prescription drug management. Decision regarding hospitalization.   This patient presents to the ED for concern of chest pain, chills, generalized weakness, this involves an extensive number of treatment options, and is a complaint that carries with it a high risk of complications and morbidity.  The differential diagnosis includes sepsis, pneumonia, urinary tract infection, electrolyte derangement, acute kidney injury, small bowel obstruction, constipation, medication reaction, dehydration   Co morbidities that complicate the patient evaluation  Lung cancer   Additional history obtained:  Additional history obtained from spouse External records from outside source obtained and reviewed including medical records   Lab Tests:  I Ordered, and personally interpreted labs.  The pertinent results include: Mild leukocytosis, anemia at baseline, mild hypokalemia, normal liver function and kidney function, negative troponin, negative lactic acid, elevated D-dimer, negative viral swab   Imaging Studies ordered:  I ordered imaging studies including chest x-ray, CTA chest, KUB I independently visualized and  interpreted imaging which  showed no pulmonary embolus, small pericardial effusion, bilateral pleural effusions I agree with the radiologist interpretation   Cardiac Monitoring: / EKG:  The patient was maintained on a cardiac monitor.  I personally viewed and interpreted the cardiac monitored which showed an underlying rhythm of: Sinus tachycardia, no ST changes, nonspecific T wave changes, no STEMI   Consultations Obtained:  I requested consultation with the hospitalist,  and discussed lab and imaging findings as well as pertinent plan - they recommend: Admission  Problem List / ED Course / Critical interventions / Medication management  Patient is doing well at this time and does remain stable.  He does remain tachycardic at this point.  Will plan for admission to the hospital service for undefined sepsis.  Viral swab has been negative.  Urinalysis is pending at this point.  Did cover with antibiotics.  CT angio demonstrated no pulmonary embolus and no obvious indication for pneumonia.  Potassium was repleted in the emergency department.  Patient has negative serial troponins and do not suspect ACS at this time.  He does have a small pericardial effusion with no other obvious indication for tamponade at this point.  Will sign patient out to Dr. Cleotilde pending conversation with hospitalist for admission. I ordered medication including Zosyn , IV fluids, potassium for sepsis, tachycardia, hypokalemia Reevaluation of the patient after these medicines showed that the patient improved I have reviewed the patients home medicines and have made adjustments as needed   Social Determinants of Health:  None   Test / Admission - Considered:  Admission     Final diagnoses:  None    ED Discharge Orders     None          Daralene Lonni JONETTA DEVONNA 06/13/24 1926    Cleotilde Rogue, MD 06/13/24 [phone]

## 2024-06-13 NOTE — H&P (Addendum)
 History and Physical    Patient: Bradley Goodman FMW:[id number] DOB: 1956/02/21 DOA: 06/13/2024 DOS: the patient was seen and examined on 06/13/2024 PCP: [name], FNP  Patient coming from: Home  Chief Complaint:  Chief Complaint  Patient presents with   Chest Pain   HPI: Bradley Goodman is a 68 y.o. 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  He complained of constipation, abdominal bloating, but denies nausea, vomiting, abdominal pain.  He endorsed taking Tylenol  PTA, though he has not had fever.  Last chemotherapy treatment was about 3 days ago  ED Course:  In the emergency department, respiratory rate was 22, pulse 128, temperature 98.9 F, BP 160/75, O2 sat was 94% on room air.  Workup in the ED showed WBC 11.4, hemoglobin 10.6, hematocrit 35.8, MCV 85.2, platelets 320.  BMP was normal except for potassium of 3.3 and blood glucose of 126, troponin 7 > 6, lipase 27, magnesium  1.9, lactic acid was normal.  Influenza A, B, SARS coronavirus 2, RSV was negative.  Blood culture pending. CT angiography chest with contrast showed no evidence of pulmonary embolism.  Small pericardial effusion, increased in size when compared to prior study.  Multiple bilateral pulmonary nodules.  Trace amount of bilateral pleural fluid. Abdominal x-ray showed moderate stool burden.  No acute findings Chest x-ray showed no active cardiopulmonary disease Patient was empirically treated with IV vancomycin  and Zosyn  due to presumed sepsis, potassium was replenished, IV hydration was provided.  TRH was asked to admit patient  Review of Systems: Review of systems as noted in the HPI. All other systems reviewed and are negative.   Past Medical History:  Diagnosis Date   Cervical  spondylolysis    Essential hypertension    GERD (gastroesophageal reflux disease)    History of kidney stones    History of migraine    Hyperlipidemia    Hypertension    lung ca with mets to brain 2022   PONV (postoperative nausea and vomiting)    Type 2 diabetes mellitus (HCC)    Past Surgical History:  Procedure Laterality Date   Bilateral inguinal hernia repair     BRONCHIAL NEEDLE ASPIRATION BIOPSY  04/05/2021   Procedure: BRONCHIAL NEEDLE ASPIRATION BIOPSIES;  Surgeon: Brenna Adine CROME, DO;  Location: [facility];  Service: Pulmonary;;   COLONOSCOPY  01/23/2012   Procedure: COLONOSCOPY;  Surgeon: Lamar HERO Hollingshead, MD;  Location: [facility];  Service: Endoscopy;  Laterality: N/A;  9:30 AM   COLONOSCOPY N/A 10/11/2015   Procedure: COLONOSCOPY;  Surgeon: Lamar HERO Hollingshead, MD;  Location: [facility];  Service: Endoscopy;  Laterality: N/A;  830   COLONOSCOPY N/A 10/14/2019   Procedure: COLONOSCOPY;  Surgeon: Hollingshead Lamar HERO, MD;  Location: [facility];  Service: Endoscopy;  Laterality: N/A;  1:45   POLYPECTOMY  10/14/2019   Procedure: POLYPECTOMY;  Surgeon: Hollingshead Lamar HERO, MD;  Location: [facility];  Service: Endoscopy;;  ascending colon, descending colon   VIDEO BRONCHOSCOPY WITH ENDOBRONCHIAL ULTRASOUND N/A 04/05/2021   Procedure: VIDEO BRONCHOSCOPY WITH ENDOBRONCHIAL ULTRASOUND;  Surgeon: Brenna Adine CROME, DO;  Location: [facility];  Service: Pulmonary;  Laterality: N/A;    Social History:  reports that he has been smoking cigarettes. He started smoking about 50 years ago. He has a 25.3  pack-year smoking history. He has never used smokeless tobacco. He reports that he does not currently use alcohol. He reports that he does not use drugs.   No Known Allergies  Family History  Problem Relation Age of Onset   Hypertension Mother    Diabetes Mother    Heart attack Mother    Hypertension Father    Heart attack Father    Heart attack Brother    Colon cancer Neg Hx      Prior  to Admission medications   Medication Sig Start Date End Date Taking? Authorizing Provider  acetaminophen  (TYLENOL ) 500 MG tablet Take 500 mg by mouth every 6 (six) hours as needed for mild pain (pain score 1-3) or fever.   Yes [provider]  chlorproMAZINE  (THORAZINE ) 10 MG tablet Take 1 tablet (10 mg total) by mouth 3 (three) times daily as needed for hiccoughs. 11/27/23  Yes [name], [name], FNP  cholecalciferol  (VITAMIN D ) 1000 UNITS tablet Take 1,000 Units by mouth every evening.   Yes [provider]  dexamethasone  (DECADRON ) 1 MG tablet Take 1 tablet (1 mg total) by mouth daily with breakfast. 05/04/24  Yes Nida, Gebreselassie W, MD  famotidine  (PEPCID ) 10 MG tablet Take 10 mg by mouth at bedtime. Chewable   Yes [provider]  folic acid  (FOLVITE ) 1 MG tablet Take 1 tablet (1 mg total) by mouth daily. Patient taking differently: Take 1 mg by mouth at bedtime. 04/03/24  Yes [name], [name], FNP  Krill Oil 300 MG CAPS Take 300 mg by mouth every evening.   Yes [provider]  levothyroxine  (SYNTHROID ) 100 MCG tablet Take 1 tablet (100 mcg total) by mouth daily before breakfast. 03/10/24  Yes Nida, Gebreselassie W, MD  loratadine  (CLARITIN ) 10 MG tablet Take 10 mg by mouth every evening.    Yes [provider]  losartan  (COZAAR ) 50 MG tablet Take 1.5 tablets (75 mg total) by mouth daily. Patient taking differently: Take 75 mg by mouth at bedtime. 04/03/24  Yes Gottschalk, Norene M, DO  mirtazapine  (REMERON ) 15 MG tablet Take 1 tablet (15 mg total) by mouth at bedtime. 04/22/24  Yes [name], [name], FNP  Multiple Vitamins-Minerals (ONE A DAY MEN 50 PLUS) TABS Take 1 tablet by mouth at bedtime.   Yes [provider]  ondansetron  (ZOFRAN ) 4 MG tablet Take 1 tablet (4 mg total) by mouth every 6 (six) hours as needed for nausea. 01/24/24  Yes Shah, Pratik D, DO  pantoprazole  (PROTONIX ) 40 MG tablet Take 1 tablet (40 mg total) by mouth every evening.  04/14/24  Yes [name], [name], FNP  polyethylene glycol powder (GLYCOLAX /MIRALAX ) 17 GM/SCOOP powder Take 119 g by mouth daily as needed for mild constipation or moderate constipation.   Yes [provider]  rosuvastatin  (CRESTOR ) 20 MG tablet Take 1 tablet (20 mg total) by mouth daily. Patient taking differently: Take 20 mg by mouth at bedtime. 04/14/24  Yes [name], [name], FNP  testosterone  cypionate (DEPOTESTOTERONE CYPIONATE) 100 MG/ML injection Take 50mg  ( 0.75ml) and 100mg  ( 1ml) into muscle alternatively weekly. 03/10/24  Yes Nida, Gebreselassie W, MD  venlafaxine  XR (EFFEXOR -XR) 75 MG 24 hr capsule Take 2 capsules (150 mg total) by mouth daily with breakfast. Patient taking differently: Take 150 mg by mouth at bedtime. 04/22/24  Yes [name], [name], FNP  Continuous Glucose Sensor (FREESTYLE LIBRE 3 PLUS SENSOR) MISC Change sensor every 15 days. 03/10/24   [name], FNP  SYRINGE-NEEDLE, DISP,  3 ML (B-D 3CC LUER-LOK SYR 21GX1-1/2) 21G X 1-1/2 3 ML MISC Use to inject testosterone  every week 03/10/24   Lenis Ethelle ORN, MD    Physical Exam: BP 131/87 (BP Location: Right Arm)   Pulse (!) 124   Temp 99.2 F (37.3 C) (Oral)   Resp 20   Ht 5' 11 (1.803 m)   Wt 86.2 kg   SpO2 97%   BMI 26.50 kg/m   General: 68 y.o. year-old male well developed well nourished in no acute distress.  Alert and oriented x3. HEENT: NCAT, EOMI, dry mucous membrane Neck: Supple, trachea medial Cardiovascular: Tachycardic. Regular rate and rhythm with no rubs or gallops.  No thyromegaly or JVD noted.  No lower extremity edema. 2/4 pulses in all 4 extremities. Respiratory: Tachypneic. Clear to auscultation with no wheezes or rales. Good inspiratory effort. Abdomen: Soft, nontender nondistended with normal bowel sounds x4 quadrants. Muskuloskeletal: No cyanosis, clubbing or edema noted bilaterally Neuro: CN II-XII intact, strength 5/5 x 4, sensation, reflexes intact Skin: No ulcerative lesions noted or  rashes Psychiatry: Judgement and insight appear normal. Mood is appropriate for condition and setting          Labs on Admission:  Basic Metabolic Panel: Recent Labs  Lab 06/09/24 0835 06/13/24 1641 06/13/24 1747  NA 143 137  --   K 3.7 3.3*  --   CL 106 101  --   CO2 29 24  --   GLUCOSE 127* 126*  --   BUN 17 15  --   CREATININE 0.96 1.21  --   CALCIUM  9.1 9.0  --   MG  --   --  1.9   Liver Function Tests: Recent Labs  Lab 06/09/24 0835 06/13/24 1700  AST 21 26  ALT 25 23  ALKPHOS 68 67  BILITOT 0.3 1.2  PROT 6.7 6.6  ALBUMIN 3.7 3.1*   Recent Labs  Lab 06/13/24 1700  LIPASE 27   No results for input(s): AMMONIA in the last 168 hours. CBC: Recent Labs  Lab 06/09/24 0835 06/13/24 1641  WBC 9.1 11.4*  NEUTROABS 6.7  --   HGB 11.2* 10.6*  HCT 37.5* 35.8*  MCV 84.3 85.2  PLT 355 320   Cardiac Enzymes: No results for input(s): CKTOTAL, CKMB, CKMBINDEX, TROPONINI in the last 168 hours.  BNP (last 3 results) No results for input(s): BNP in the last 8760 hours.  ProBNP (last 3 results) No results for input(s): PROBNP in the last 8760 hours.  CBG: No results for input(s): GLUCAP in the last 168 hours.  Radiological Exams on Admission: CT Angio Chest PE W/Cm &/Or Wo Cm Result Date: 06/13/2024 CLINICAL DATA:  Weakness and chest tightness. EXAM: CT ANGIOGRAPHY CHEST WITH CONTRAST TECHNIQUE: Multidetector CT imaging of the chest was performed using the standard protocol during bolus administration of intravenous contrast. Multiplanar CT image reconstructions and MIPs were obtained to evaluate the vascular anatomy. RADIATION DOSE REDUCTION: This exam was performed according to the departmental dose-optimization program which includes automated exposure control, adjustment of the mA and/or kV according to patient size and/or use of iterative reconstruction technique. CONTRAST:  75mL OMNIPAQUE  IOHEXOL  350 MG/ML SOLN COMPARISON:  None Available.  FINDINGS: Cardiovascular: There is moderate to marked severity calcification of the aortic arch, without evidence of aortic aneurysm. Satisfactory opacification of the pulmonary arteries to the segmental level. No evidence of pulmonary embolism. Normal heart size with mild coronary artery calcification. There is a small pericardial effusion which measures 1.3 cm  in maximum thickness and is increased in size when compared to the prior study. Mediastinum/Nodes: No enlarged mediastinal, hilar, or axillary lymph nodes. Thyroid  gland, trachea, and esophagus demonstrate no significant findings. Lungs/Pleura: Stable, ill-defined 5 mm and 7 mm nodular appearing areas are seen along the periphery of the lateral and posterolateral right upper lobe (axial CT image 50, CT series 6). An additional stable 4 mm lateral right upper lobe pulmonary nodule is seen (axial CT image 44, CT series 6). A small, slightly patchy, nodular appearing area is seen within the right upper lobe (axial CT images 61 through 67, CT series 6). This was described as a 7 mm irregular nodularity on the prior study, and is less distinct on the current exam (measures approximately 1.7 cm). A stable 3 mm anteromedial left upper lobe pulmonary nodule is seen (axial CT image 61, CT series 6). A stable, 5 mm ground-glass pulmonary nodule is seen within the posterolateral aspect of the left upper lobe (axial CT image 74, CT series 6). A trace amount of pleural fluid is present, bilaterally. No pneumothorax is identified. Upper Abdomen: There is diffuse fatty infiltration of the liver parenchyma. An 8 mm nonobstructing left renal calculus is present. Musculoskeletal: No chest wall abnormality. No acute or significant osseous findings. Review of the MIP images confirms the above findings. IMPRESSION: 1. No evidence of pulmonary embolism. 2. Small pericardial effusion, increased in size when compared to the prior study. 3. Multiple bilateral pulmonary nodules, as  described above, with a small, ill-defined nodular appearing area within the right upper lobe that is mildly increased in size when compared to the prior study. Further evaluation is recommended to exclude focal progression of neoplastic disease. 4. Trace amount of bilateral pleural fluid. 5. Hepatic steatosis. 6. 8 mm nonobstructing left renal calculus. 7. Aortic atherosclerosis. Electronically Signed   By: Suzen Dials M.D.   On: 06/13/2024 19:05   DG Abdomen 1 View Result Date: 06/13/2024 CLINICAL DATA:  Constipation.  Chest tightness. EXAM: ABDOMEN - 1 VIEW COMPARISON:  None Available. FINDINGS: There is a non obstructive bowel gas pattern. No supine evidence of free air. No organomegaly or suspicious calcification. No acute bony abnormality. Moderate stool burden. IMPRESSION: Moderate stool burden.  No acute findings. Electronically Signed   By: Franky Crease M.D.   On: 06/13/2024 17:41   DG Chest 2 View Result Date: 06/13/2024 CLINICAL DATA:  Chest pain EXAM: CHEST - 2 VIEW COMPARISON:  02/29/2024 FINDINGS: Heart and mediastinal contours are within normal limits. No focal opacities or effusions. No acute bony abnormality. IMPRESSION: No active cardiopulmonary disease. Electronically Signed   By: Franky Crease M.D.   On: 06/13/2024 17:41    EKG: I independently viewed the EKG done and my findings are as followed: Sinus tachycardia at a rate of 129 bpm with nonspecific T wave abnormality.  Assessment/Plan Present on Admission:  GERD (gastroesophageal reflux disease)  Vitamin D  deficiency  Depression, recurrent (HCC)  Hyperlipidemia associated with type 2 diabetes mellitus (HCC)  Adenocarcinoma of right lung, stage 4 (HCC)  Adrenal insufficiency (HCC)  Acquired hypothyroidism  Type 2 diabetes mellitus with hyperglycemia, without long-term current use of insulin  (HCC)  Essential hypertension  SIRS (systemic inflammatory response syndrome) (HCC)  Principal Problem:   SIRS (systemic  inflammatory response syndrome) (HCC) Active Problems:   GERD (gastroesophageal reflux disease)   Essential hypertension   Vitamin D  deficiency   Depression, recurrent (HCC)   Hyperlipidemia associated with type 2 diabetes mellitus (HCC)  Adenocarcinoma of right lung, stage 4 (HCC)   Adrenal insufficiency (HCC)   Acquired hypothyroidism   Type 2 diabetes mellitus with hyperglycemia, without long-term current use of insulin  (HCC)   Generalized weakness  SIRS with suspicion for sepsis Patient was tachycardic and tachypneic (met SIRS criteria), but no obvious source of infection Chest x-ray showed no active cardiopulmonary disease Lactic acid was normal Patient was empirically started on IV vancomycin  and Zosyn  with plan to de-escalate/discontinue based on blood culture.  Generalized weakness Dehydration Continue IV hydration Continue fall precaution Continue PT/OT eval and treat  Hypoalbuminemia possibly secondary to mild protein calorie malnutrition Albumin 3.1, protein supplement will be provided  Hypokalemia K+ 3.3, this was replenished  Constipation Continue MiraLAX   Acquired hypothyroidism Continue Synthroid .   History of adrenal insufficiency Continue Decadron    Essential hypertension Continue losartan   Mixed hyperlipidemia Continue Crestor   Depression Continue Remeron  and Effexor    Type 2 diabetes melitis with hyperglycemia Hemoglobin A1c on 04/23/2023 was 6.4 Currently diet controlled Continue sliding scale insulin  and follow CBG fluctuation.  Vitamin D  deficiency Continue vitamin D    GERD Continue PPI.   Adenocarcinoma of right lung, stage 4 (T1b,N3,M1C) Diagnosed June 2022.  F/w Dr. Gatha.   She was last seen on 06/09/2024 by Heilingoetter Cassandra PA-C; she had her a chemotherapy on same day. Consider palliative care consult   DVT prophylaxis: Lovenox   Code Status: Full code  Family Communication: Wife at bedside (all questions answered  to satisfaction)  Consults: None  Severity of Illness: The appropriate patient status for this patient is INPATIENT. Inpatient status is judged to be reasonable and necessary in order to provide the required intensity of service to ensure the patient's safety. The patient's presenting symptoms, physical exam findings, and initial radiographic and laboratory data in the context of their chronic comorbidities is felt to place them at high risk for further clinical deterioration. Furthermore, it is not anticipated that the patient will be medically stable for discharge from the hospital within 2 midnights of admission.   * I certify that at the point of admission it is my clinical judgment that the patient will require inpatient hospital care spanning beyond 2 midnights from the point of admission due to high intensity of service, high risk for further deterioration and high frequency of surveillance required.*  Author: Kierre Hintz, DO 06/13/2024 8:56 PM  For on call review [email or web address].

## 2024-06-13 NOTE — ED Triage Notes (Signed)
 Pt stated that he has been having weakness and chest tightness since yesterday. No SOB.

## 2024-06-13 NOTE — Progress Notes (Signed)
 Pharmacy Antibiotic Note  Bradley Goodman is a 68 y.o. male admitted on 06/13/2024 with sepsis.  Pharmacy has been consulted for Zosyn  and Vancomycin  dosing.  Plan: Zosyn  3.375g IV q8h (4 hour infusion).  Vancomycin  1750 mg IV Q 24 hrs. Goal AUC 400-550. Expected AUC: 470.2 SCr used: 1.21 Expected Cmin: 9.9   Height: 5' 11 (180.3 cm) Weight: 86.2 kg (190 lb) IBW/kg (Calculated) : 75.3  Temp (24hrs), Avg:98.9 F (37.2 C), Min:98.9 F (37.2 C), Max:98.9 F (37.2 C)  Recent Labs  Lab 06/09/24 0835 06/13/24 1641 06/13/24 1747  WBC 9.1 11.4*  --   CREATININE 0.96 1.21  --   LATICACIDVEN  --   --  1.3    Estimated Creatinine Clearance: 63.1 mL/min (by C-G formula based on SCr of 1.21 mg/dL).    No Known Allergies  Antimicrobials this admission: Zosyn  9/6 >>  Vancomycin  9/6 >>   Dose adjustments this admission:  Microbiology results:   Thank you for allowing pharmacy to be a part of this patient's care.  Olam Fritter, PharmD, BCPS 06/13/2024 8:36 PM

## 2024-06-14 ENCOUNTER — Inpatient Hospital Stay (HOSPITAL_COMMUNITY)

## 2024-06-14 DIAGNOSIS — C3491 Malignant neoplasm of unspecified part of right bronchus or lung: Secondary | ICD-10-CM | POA: Diagnosis not present

## 2024-06-14 DIAGNOSIS — I3139 Other pericardial effusion (noninflammatory): Secondary | ICD-10-CM

## 2024-06-14 DIAGNOSIS — R531 Weakness: Secondary | ICD-10-CM | POA: Diagnosis not present

## 2024-06-14 DIAGNOSIS — R6510 Systemic inflammatory response syndrome (SIRS) of non-infectious origin without acute organ dysfunction: Secondary | ICD-10-CM | POA: Diagnosis not present

## 2024-06-14 LAB — PHOSPHORUS: Phosphorus: 3 mg/dL (ref 2.5–4.6)

## 2024-06-14 LAB — RESPIRATORY PANEL BY PCR

## 2024-06-14 LAB — COMPREHENSIVE METABOLIC PANEL WITH GFR
ALT: 18 U/L (ref 0–44)
AST: 22 U/L (ref 15–41)
Albumin: 2.5 g/dL — ABNORMAL LOW (ref 3.5–5.0)
Alkaline Phosphatase: 56 U/L (ref 38–126)
Anion gap: 8 (ref 5–15)
BUN: 12 mg/dL (ref 8–23)
CO2: 22 mmol/L (ref 22–32)
Calcium: 8.4 mg/dL — ABNORMAL LOW (ref 8.9–10.3)
Chloride: 105 mmol/L (ref 98–111)
Creatinine, Ser: 1.07 mg/dL (ref 0.61–1.24)
GFR, Estimated: >60 mL/min (ref >60)
Glucose, Bld: 117 mg/dL — ABNORMAL HIGH (ref 70–99)
Potassium: 3.9 mmol/L (ref 3.5–5.1)
Sodium: 135 mmol/L (ref 135–145)
Total Bilirubin: 1.2 mg/dL (ref 0.0–1.2)
Total Protein: 5.9 g/dL — ABNORMAL LOW (ref 6.5–8.1)

## 2024-06-14 LAB — CBC
HCT: 30.8 % — ABNORMAL LOW (ref 39.0–52.0)
Hemoglobin: 9.3 g/dL — ABNORMAL LOW (ref 13.0–17.0)
MCH: 25.9 pg — ABNORMAL LOW (ref 26.0–34.0)
MCHC: 30.2 g/dL (ref 30.0–36.0)
MCV: 85.8 fL (ref 80.0–100.0)
Platelets: 269 K/uL (ref 150–400)
RBC: 3.59 MIL/uL — ABNORMAL LOW (ref 4.22–5.81)
RDW: 19.9 % — ABNORMAL HIGH (ref 11.5–15.5)
WBC: 10.8 K/uL — ABNORMAL HIGH (ref 4.0–10.5)
nRBC: 0 % (ref 0.0–0.2)

## 2024-06-14 LAB — CORTISOL: Cortisol, Plasma: 3.7 ug/dL

## 2024-06-14 LAB — ECHOCARDIOGRAM COMPLETE
AR max vel: 2.37 cm2
AV Peak grad: 12.7 mmHg
Ao pk vel: 1.78 m/s
Area-P 1/2: 5.84 cm2
Height: 71 in
S' Lateral: 3.2 cm
Weight: 3040 [oz_av]

## 2024-06-14 LAB — FOLATE: Folate: >20 ng/mL (ref >5.9)

## 2024-06-14 LAB — IRON AND TIBC
Iron: 35 ug/dL — ABNORMAL LOW (ref 45–182)
Saturation Ratios: 12 % — ABNORMAL LOW (ref 17.9–39.5)
TIBC: 286 ug/dL (ref 250–450)
UIBC: 251 ug/dL

## 2024-06-14 LAB — URINALYSIS, COMPLETE (UACMP) WITH MICROSCOPIC
Bacteria, UA: NONE SEEN
Bilirubin Urine: NEGATIVE
Glucose, UA: NEGATIVE mg/dL
Hgb urine dipstick: NEGATIVE
Ketones, ur: NEGATIVE mg/dL
Leukocytes,Ua: NEGATIVE
Nitrite: NEGATIVE
Protein, ur: NEGATIVE mg/dL
Specific Gravity, Urine: 1.012 (ref 1.005–1.030)
pH: 7 (ref 5.0–8.0)

## 2024-06-14 LAB — MAGNESIUM: Magnesium: 1.9 mg/dL (ref 1.7–2.4)

## 2024-06-14 LAB — FERRITIN: Ferritin: 581 ng/mL — ABNORMAL HIGH (ref 24–336)

## 2024-06-14 LAB — CK: Total CK: 43 U/L — ABNORMAL LOW (ref 49–397)

## 2024-06-14 LAB — PROCALCITONIN: Procalcitonin: 0.12 ng/mL

## 2024-06-14 LAB — HEMOGLOBIN A1C
Hgb A1c MFr Bld: 6.8 % — ABNORMAL HIGH (ref 4.8–5.6)
Mean Plasma Glucose: 148.46 mg/dL

## 2024-06-14 LAB — VITAMIN B12: Vitamin B-12: 540 pg/mL (ref 180–914)

## 2024-06-14 LAB — TSH: TSH: 0.106 u[IU]/mL — ABNORMAL LOW (ref 0.350–4.500)

## 2024-06-14 MED ORDER — DEXAMETHASONE 0.5 MG PO TABS
3.0000 mg | ORAL_TABLET | Freq: Once | ORAL | Status: AC
  Administered 2024-06-14: 3 mg via ORAL
  Filled 2024-06-14: qty 2

## 2024-06-14 MED ORDER — DEXAMETHASONE 0.5 MG PO TABS
1.0000 mg | ORAL_TABLET | Freq: Every day | ORAL | Status: DC
  Filled 2024-06-14: qty 2

## 2024-06-14 MED ORDER — DEXAMETHASONE 4 MG PO TABS
2.0000 mg | ORAL_TABLET | Freq: Once | ORAL | Status: AC
  Administered 2024-06-15: 2 mg via ORAL
  Filled 2024-06-14: qty 1

## 2024-06-14 NOTE — Plan of Care (Signed)

## 2024-06-14 NOTE — Progress Notes (Signed)
  Echocardiogram 2D Echocardiogram has been performed.  Bradley Goodman 06/14/2024, 10:45 AM

## 2024-06-14 NOTE — TOC Initial Note (Signed)
 Transition of Care [facility]) - Initial/Assessment Note    Patient Details  Name: Bradley Goodman MRN: [id number] Date of Birth: Feb 19, 1956  Transition of Care [facility] CM/SW Contact:    Nena LITTIE Coffee, RN Phone Number: 06/14/2024, 4:04 PM  Clinical Narrative:                 Pt assessed for high readmission status. Independent c/supportive family. Active c/oncology/chemotherapy.   Expected Discharge Plan: Home/Self Care Barriers to Discharge: Continued Medical Work up   Patient Goals and CMS Choice Patient states their goals for this hospitalization and ongoing recovery are:: Return home          Expected Discharge Plan and Services In-house Referral: Clinical Social Work Discharge Planning Services: CM Consult Post Acute Care Choice: NA Living arrangements for the past 2 months: Single Family Home                                      Prior Living Arrangements/Services Living arrangements for the past 2 months: Single Family Home Lives with:: Spouse Patient language and need for interpreter reviewed:: Yes Do you feel safe going back to the place where you live?: Yes      Need for Family Participation in Patient Care: Yes (Comment) Care giver support system in place?: Yes (comment)   Criminal Activity/Legal Involvement Pertinent to Current Situation/Hospitalization: No - Comment as needed  Activities of Daily Living   ADL Screening (condition at time of admission) Independently performs ADLs?: Yes (appropriate for developmental age) Is the patient deaf or have difficulty hearing?: No Does the patient have difficulty seeing, even when wearing glasses/contacts?: No Does the patient have difficulty concentrating, remembering, or making decisions?: No  Permission Sought/Granted                  Emotional Assessment Appearance:: Appears stated age Attitude/Demeanor/Rapport: Engaged Affect (typically observed): Appropriate Orientation: : Oriented to Self,  Oriented to Place, Oriented to  Time, Oriented to Situation Alcohol / Substance Use: Not Applicable Psych Involvement: No (comment)  Admission diagnosis:  Hypokalemia [E87.6] Generalized weakness [R53.1] Sepsis, due to unspecified organism, unspecified whether acute organ dysfunction present [facility]) [A41.9] Patient Active Problem List   Diagnosis Date Noted   Generalized weakness 06/13/2024   Difficulty sleeping 04/22/2024   Hyponatremia 03/03/2024   SIRS (systemic inflammatory response syndrome) (HCC) 02/29/2024   Hypoalbuminemia due to protein-calorie malnutrition (HCC) 01/22/2024   Dehydration 01/22/2024   Type 2 diabetes mellitus (HCC) 01/14/2024   Leukocytosis 11/18/2023   Thrombocytosis 11/18/2023   Current smoker 06/14/2022   Recurrent major depression in remission (HCC) 04/26/2022   Type 2 diabetes mellitus with hyperglycemia, without long-term current use of insulin  (HCC) 01/10/2022   Adrenal insufficiency (HCC) 06/27/2021   Hypogonadism, male 06/27/2021   Acquired hypothyroidism 06/27/2021   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   Adenocarcinoma of right lung, stage 4 (HCC) 04/25/2021   Encounter for antineoplastic chemotherapy 04/25/2021   Encounter for antineoplastic immunotherapy 04/25/2021   Malignant neoplasm metastatic to brain (HCC) 04/05/2021   Adenopathy 04/03/2021   Tobacco use 07/14/2020   Hyperlipidemia associated with type 2 diabetes mellitus (HCC) 11/09/2019   Vitamin D  deficiency 07/08/2019   Depression, recurrent (HCC) 07/08/2019   History of colonic polyps    History of adenomatous polyp of colon 09/14/2015   GERD (gastroesophageal reflux disease) 06/24/2013   Essential hypertension 06/24/2013   PCP:  [name],  FNP Pharmacy:   [facility] [address] [place] [address] Phone: [phone] Fax: [phone]  [facility] [address], [place] - [address] [place] [address] [place] [address] [place]  [address] Phone: [phone] Fax: [phone]     Social Drivers of Health (SDOH) Social History: SDOH Screenings   Food Insecurity: No Food Insecurity (06/13/2024)  Housing: Low Risk  (06/13/2024)  Transportation Needs: No Transportation Needs (06/13/2024)  Utilities: Not At Risk (06/13/2024)  Alcohol Screen: Low Risk  (04/19/2024)  Depression (PHQ2-9): Low Risk  (06/09/2024)  Financial Resource Strain: Low Risk  (04/19/2024)  Physical Activity: Unknown (04/19/2024)  Social Connections: Unknown (06/13/2024)  Recent Concern: Social Connections - Moderately Isolated (04/19/2024)  Stress: No Stress Concern Present (04/19/2024)  Tobacco Use: High Risk (06/13/2024)   SDOH Interventions:     Readmission Risk Interventions    06/14/2024    4:00 PM 01/23/2024   10:04 AM 01/22/2024    9:39 PM  Readmission Risk Prevention Plan  Transportation Screening Complete  Complete  PCP or Specialist Appt within 5-7 Days   Complete  Home Care Screening   Complete  Medication Review (RN CM)   Complete  HRI or Home Care Consult  Complete   Social Work Consult for Recovery Care Planning/Counseling  Complete   Palliative Care Screening  Not Applicable   Medication Review Oceanographer) Complete Complete   PCP or Specialist appointment within 3-5 days of discharge Complete    HRI or Home Care Consult Complete    SW Recovery Care/Counseling Consult Complete    Palliative Care Screening Not Applicable    Skilled Nursing Facility Not Applicable

## 2024-06-14 NOTE — Hospital Course (Addendum)
 68 year old male with a history of stage IV NSCLC (dx 03/2021), adrenal insufficiency, hypothyroidism, depression, hyperlipidemia presenting with fevers, chills, generalized weakness and chest tightness for 1-2 days.  He has been feeling  tired since his last cycle of chemotherapy on 06/09/2024.  He denied any headache, neck pain, dizziness, visual disturbance. He has had a nonproductive cough and some chest congestion but denies any frank shortness of breath.  There is no hemoptysis.  He has had some chest tightness at rest.  It is improved in the morning of 06/14/2024.  He has not had any nausea, vomiting, diarrhea, abdominal pain.  He is still some abdominal bloating.  He is passing gas.  His last bowel movement was 2 days prior to admission.  There is no hematochezia or melena.  There is no dysuria or hematuria.  His oral intake has not been good for the past 2 days.  He endorses compliance with all his medications including his dexamethasone .  He has been on dexamethasone  1 mg daily for the last 2 years without any changes in his dosing.  He denies any rashes or synovitis.  He did have a low-grade temperature of 99.5 F at home.  In the ED, the patient had a low-grade temperature 99.2.   Oxygen saturation is 97% room air. He was hemodynamically stable but tachycardic in the 120s. WBC 11.4, hemoglobin 1.6, platelets 320. Sodium 137, potassium 3.3, bicarbonate 24, serum creatinine 1.21.  LFTs were unremarkable.  CTA chest was negative for PE.  Showed small pericardial effusion with slight increase in size compared to prior.  There is multiple bilateral pulmonary nodules.  There was an ill-defined nodular appearance to the area in the right upper lobe.  There is trace bilateral fluid.  The patient was started on Zosyn  and IV fluids.  Regarding his oncologic history, the patient has been following Dr. Gatha.  He was diagnosed with NSCLC June 2022 when he presented with right upper lobe lung nodule in  addition to right hilar, subcarinal and bilateral mediastinal as well as supraclavicular lymphadenopathy in addition to bone and brain metastasis   The patient underwent SRS to metastatic brain lesion under the care of Dr. Patrcia and he is currently undergoing systemic chemotherapy with carboplatin  for AUC of 5, Alimta  500 Mg/M2 and Keytruda  200 Mg IV every 3 weeks status post 51 cycles  He is currently Currently on cycle 53 of treatment, last treatment 06/09/2024.  The patient was started on IV fluids and empiric antibiotics.  His electrolytes were optimized.  The patient improved gradually with conservative measures. On the evening of 06/15/2024, the patient developed acute onset of upper abdominal pain and right sided abdominal pain which was preceded by some hiccups.  There was no emesis.  The patient was treated with antiemetics and Thorazine .  CT abdomen and pelvis showed fatty infiltration of the distal duodenum and proximal jejunum.  There is no bowel obstruction or dilated loops of bowel.  Gallbladder was normal.  He improved with antiemetics and Thorazine .  LFTs and lipase were unremarkable.  He continued to tolerate his diet the following day without difficulty or recurrence of his abdominal pain.

## 2024-06-14 NOTE — Progress Notes (Signed)
 PROGRESS NOTE  OPAL MCKELLIPS FMW:[id number] DOB: 02-Feb-1956 DOA: 06/13/2024 PCP: [name], FNP  Brief History:  68 year old male with a history of stage IV NSCLC (dx 03/2021), adrenal insufficiency, hypothyroidism, depression, hyperlipidemia presenting with fevers, chills, generalized weakness and chest tightness for 1-2 days.  He has been feeling  tired since his last cycle of chemotherapy on 06/09/2024.  He denied any headache, neck pain, dizziness, visual disturbance. He has had a nonproductive cough and some chest congestion but denies any frank shortness of breath.  There is no hemoptysis.  He has had some chest tightness at rest.  It is improved in the morning of 06/14/2024.  He has not had any nausea, vomiting, diarrhea, abdominal pain.  He is still some abdominal bloating.  He is passing gas.  His last bowel movement was 2 days prior to admission.  There is no hematochezia or melena.  There is no dysuria or hematuria.  His oral intake has not been good for the past 2 days.  He endorses compliance with all his medications including his dexamethasone .  He has been on dexamethasone  1 mg daily for the last 2 years without any changes in his dosing.  He denies any rashes or synovitis.  He did have a low-grade temperature of 99.5 F at home.  In the ED, the patient had a low-grade temperature 99.2.   Oxygen saturation is 97% room air. He was hemodynamically stable but tachycardic in the 120s. WBC 11.4, hemoglobin 1.6, platelets 320. Sodium 137, potassium 3.3, bicarbonate 24, serum creatinine 1.21.  LFTs were unremarkable.  CTA chest was negative for PE.  Showed small pericardial effusion with slight increase in size compared to prior.  There is multiple bilateral pulmonary nodules.  There was an ill-defined nodular appearance to the area in the right upper lobe.  There is trace bilateral fluid.  The patient was started on Zosyn  and IV fluids.  Regarding his oncologic history, the patient  has been following Dr. Gatha.  He was diagnosed with NSCLC June 2022 when he presented with right upper lobe lung nodule in addition to right hilar, subcarinal and bilateral mediastinal as well as supraclavicular lymphadenopathy in addition to bone and brain metastasis   The patient underwent SRS to metastatic brain lesion under the care of Dr. Patrcia and he is currently undergoing systemic chemotherapy with carboplatin  for AUC of 5, Alimta  500 Mg/M2 and Keytruda  200 Mg IV every 3 weeks status post 51 cycles  He is currently Currently on cycle 53 of treatment, last treatment 06/09/2024.   Assessment/Plan:  SIRS - Patient was tachycardic and tachypnea with leukocytosis. -Lactic acid 1.3 -Follow-up blood cultures -Continue Parikh Zosyn  -Obtain UA and urine culture -CTA chest negative for PE; ill-defined nodular appearing area right upper lobe -Check procalcitonin -COVID/RSV/Flu--neg - Viral respiratory panel - Temporarily increase dexamethasone  dose  Generalized weakness -TSH -B12 -Folic acid  -Obtain UA  Adrenal insufficiency -Chronically on dexamethasone  1 mg daily -Temporarily increase dexamethasone  dosing  Pericardial effusion -CTA noted slight increase -Limited echo  Essential hypertension -Holding losartan   Mixed hyperlipidemia -Continue statin  Stage 4 NSCLC - Last chemotherapeutic treatment 06/09/2024 -Follow-up Dr. Gatha  Depression -Continue Effexor   Steroid-induced diabetes -Check hemoglobin A1c -NovoLog  sliding scale  Hypothyroidism -Continue synthroid       Family Communication:   no Family at bedside  Consultants:  none  Code Status:  FULL   DVT Prophylaxis:  [place] Lovenox    Procedures: As  Listed in Progress Note Above  Antibiotics: None       Subjective: Patient is feeling little better this morning.  He still feeling weak.  He denies any headache, chest pain, shortness breath, nausea, vomiting, diarrhea or abdominal  pain.  Objective: Vitals:   06/13/24 2015 06/13/24 2045 06/13/24 2048 06/14/24 0402  BP: 129/81  131/87 114/79  Pulse: (!) 119  (!) 124 (!) 103  Resp: (!) 24  20 18   Temp:   99.2 F (37.3 C) 97.9 F (36.6 C)  TempSrc:   Oral   SpO2: 94% 97% 97% 96%  Weight:      Height:        Intake/Output Summary (Last 24 hours) at 06/14/2024 0837 Last data filed at 06/14/2024 0528 Gross per 24 hour  Intake 240 ml  Output --  Net 240 ml   Weight change:  Exam:  General:  Pt is alert, follows commands appropriately, not in acute distress HEENT: No icterus, No thrush, No neck mass, [place]/AT Cardiovascular: RRR, S1/S2, no rubs, no gallops Respiratory: Bibasilar rales.  No wheezing.  Good air movement. Abdomen: Soft/+BS, non tender, non distended, no guarding Extremities: trace LE edema, No lymphangitis, No petechiae, No rashes, no synovitis   Data Reviewed: I have personally reviewed following labs and imaging studies Basic Metabolic Panel: Recent Labs  Lab 06/09/24 0835 06/13/24 1641 06/13/24 1747 06/14/24 0359  NA 143 137  --  135  K 3.7 3.3*  --  3.9  CL 106 101  --  105  CO2 29 24  --  22  GLUCOSE 127* 126*  --  117*  BUN 17 15  --  12  CREATININE 0.96 1.21  --  1.07  CALCIUM  9.1 9.0  --  8.4*  MG  --   --  1.9 1.9  PHOS  --   --   --  3.0   Liver Function Tests: Recent Labs  Lab 06/09/24 0835 06/13/24 1700 06/14/24 0359  AST 21 26 22   ALT 25 23 18   ALKPHOS 68 67 56  BILITOT 0.3 1.2 1.2  PROT 6.7 6.6 5.9*  ALBUMIN 3.7 3.1* 2.5*   Recent Labs  Lab 06/13/24 1700  LIPASE 27   No results for input(s): AMMONIA in the last 168 hours. Coagulation Profile: No results for input(s): INR, PROTIME in the last 168 hours. CBC: Recent Labs  Lab 06/09/24 0835 06/13/24 1641 06/14/24 0359  WBC 9.1 11.4* 10.8*  NEUTROABS 6.7  --   --   HGB 11.2* 10.6* 9.3*  HCT 37.5* 35.8* 30.8*  MCV 84.3 85.2 85.8  PLT 355 320 269   Cardiac Enzymes: No results for input(s):  CKTOTAL, CKMB, CKMBINDEX, TROPONINI in the last 168 hours. BNP: Invalid input(s): POCBNP CBG: No results for input(s): GLUCAP in the last 168 hours. HbA1C: No results for input(s): HGBA1C in the last 72 hours. Urine analysis:    Component Value Date/Time   COLORURINE YELLOW 02/29/2024 1553   APPEARANCEUR CLEAR 02/29/2024 1553   APPEARANCEUR Clear 12/11/2017 1117   LABSPEC 1.016 02/29/2024 1553   PHURINE 7.0 02/29/2024 1553   GLUCOSEU NEGATIVE 02/29/2024 1553   HGBUR NEGATIVE 02/29/2024 1553   BILIRUBINUR NEGATIVE 02/29/2024 1553   BILIRUBINUR Negative 12/11/2017 1117   KETONESUR 5 (A) 02/29/2024 1553   PROTEINUR 100 (A) 02/29/2024 1553   UROBILINOGEN negative 07/13/2014 1059   NITRITE NEGATIVE 02/29/2024 1553   LEUKOCYTESUR NEGATIVE 02/29/2024 1553   Sepsis Labs: @LABRCNTIP (procalcitonin:4,lacticidven:4) ) Recent Results (from the past  240 hours)  Resp panel by RT-PCR (RSV, Flu A&B, Covid) Anterior Nasal Swab     Status: None   Collection Time: 06/13/24  4:53 PM   Specimen: Anterior Nasal Swab  Result Value Ref Range Status   SARS Coronavirus 2 by RT PCR NEGATIVE NEGATIVE Final    Comment: (NOTE) SARS-CoV-2 target nucleic acids are NOT DETECTED.  The SARS-CoV-2 RNA is generally detectable in upper respiratory specimens during the acute phase of infection. The lowest concentration of SARS-CoV-2 viral copies this assay can detect is 138 copies/mL. A negative result does not preclude SARS-Cov-2 infection and should not be used as the sole basis for treatment or other patient management decisions. A negative result may occur with  improper specimen collection/handling, submission of specimen other than nasopharyngeal swab, presence of viral mutation(s) within the areas targeted by this assay, and inadequate number of viral copies(<138 copies/mL). A negative result must be combined with clinical observations, patient history, and epidemiological information.  The expected result is Negative.  Fact Sheet for Patients:  BloggerCourse.com  Fact Sheet for Healthcare Providers:  SeriousBroker.it  This test is no t yet approved or cleared by the [place]  FDA and  has been authorized for detection and/or diagnosis of SARS-CoV-2 by FDA under an Emergency Use Authorization (EUA). This EUA will remain  in effect (meaning this test can be used) for the duration of the COVID-19 declaration under Section 564(b)(1) of the Act, 21 U.S.C.section 360bbb-3(b)(1), unless the authorization is terminated  or revoked sooner.       Influenza A by PCR NEGATIVE NEGATIVE Final   Influenza B by PCR NEGATIVE NEGATIVE Final    Comment: (NOTE) The Xpert Xpress SARS-CoV-2/FLU/RSV plus assay is intended as an aid in the diagnosis of influenza from Nasopharyngeal swab specimens and should not be used as a sole basis for treatment. Nasal washings and aspirates are unacceptable for Xpert Xpress SARS-CoV-2/FLU/RSV testing.  Fact Sheet for Patients: BloggerCourse.com  Fact Sheet for Healthcare Providers: SeriousBroker.it  This test is not yet approved or cleared by the [place]  FDA and has been authorized for detection and/or diagnosis of SARS-CoV-2 by FDA under an Emergency Use Authorization (EUA). This EUA will remain in effect (meaning this test can be used) for the duration of the COVID-19 declaration under Section 564(b)(1) of the Act, 21 U.S.C. section 360bbb-3(b)(1), unless the authorization is terminated or revoked.     Resp Syncytial Virus by PCR NEGATIVE NEGATIVE Final    Comment: (NOTE) Fact Sheet for Patients: BloggerCourse.com  Fact Sheet for Healthcare Providers: SeriousBroker.it  This test is not yet approved or cleared by the [place]  FDA and has been authorized for detection and/or  diagnosis of SARS-CoV-2 by FDA under an Emergency Use Authorization (EUA). This EUA will remain in effect (meaning this test can be used) for the duration of the COVID-19 declaration under Section 564(b)(1) of the Act, 21 U.S.C. section 360bbb-3(b)(1), unless the authorization is terminated or revoked.  Performed at [facility], [address], [place], [place] [address]   Culture, blood (routine x 2)     Status: None (Preliminary result)   Collection Time: 06/13/24  5:47 PM   Specimen: BLOOD RIGHT ARM  Result Value Ref Range Status   Specimen Description   Final    BLOOD RIGHT ARM BOTTLES DRAWN AEROBIC AND ANAEROBIC   Special Requests   Final    Blood Culture adequate volume Performed at [facility], [address], [place], [place] [address]  Culture PENDING  Incomplete   Report Status PENDING  Incomplete  Culture, blood (routine x 2)     Status: None (Preliminary result)   Collection Time: 06/13/24  5:55 PM   Specimen: Left Antecubital; Blood  Result Value Ref Range Status   Specimen Description   Final    LEFT ANTECUBITAL BOTTLES DRAWN AEROBIC AND ANAEROBIC   Special Requests   Final    Blood Culture results may not be optimal due to an inadequate volume of blood received in culture bottles Performed at [facility], [address], [place], [place] [address]    Culture PENDING  Incomplete   Report Status PENDING  Incomplete     Scheduled Meds:  cholecalciferol   1,000 Units Oral QPM   [START ON 06/16/2024] dexamethasone   1 mg Oral Daily   [START ON 06/15/2024] dexamethasone   2 mg Oral Once   dexamethasone   3 mg Oral Once   enoxaparin  (LOVENOX ) injection  40 mg Subcutaneous Q24H   famotidine   10 mg Oral QHS   feeding supplement  237 mL Oral BID BM   folic acid   1 mg Oral QHS   insulin  aspart  0-9 Units Subcutaneous TID WC   levothyroxine   100 mcg Oral Q0600   mirtazapine   15 mg Oral QHS   multivitamin with minerals  1 tablet Oral QHS   pantoprazole   40 mg Oral QPM    polyethylene glycol  17 g Oral Daily   rosuvastatin   20 mg Oral QHS   venlafaxine  XR  150 mg Oral QHS   Continuous Infusions:  lactated ringers      lactated ringers  150 mL/hr at 06/14/24 0159   piperacillin -tazobactam (ZOSYN )  IV 3.375 g (06/14/24 0606)   vancomycin       Procedures/Studies: CT Angio Chest PE W/Cm &/Or Wo Cm Result Date: 06/13/2024 CLINICAL DATA:  Weakness and chest tightness. EXAM: CT ANGIOGRAPHY CHEST WITH CONTRAST TECHNIQUE: Multidetector CT imaging of the chest was performed using the standard protocol during bolus administration of intravenous contrast. Multiplanar CT image reconstructions and MIPs were obtained to evaluate the vascular anatomy. RADIATION DOSE REDUCTION: This exam was performed according to the departmental dose-optimization program which includes automated exposure control, adjustment of the mA and/or kV according to patient size and/or use of iterative reconstruction technique. CONTRAST:  75mL OMNIPAQUE  IOHEXOL  350 MG/ML SOLN COMPARISON:  None Available. FINDINGS: Cardiovascular: There is moderate to marked severity calcification of the aortic arch, without evidence of aortic aneurysm. Satisfactory opacification of the pulmonary arteries to the segmental level. No evidence of pulmonary embolism. Normal heart size with mild coronary artery calcification. There is a small pericardial effusion which measures 1.3 cm in maximum thickness and is increased in size when compared to the prior study. Mediastinum/Nodes: No enlarged mediastinal, hilar, or axillary lymph nodes. Thyroid  gland, trachea, and esophagus demonstrate no significant findings. Lungs/Pleura: Stable, ill-defined 5 mm and 7 mm nodular appearing areas are seen along the periphery of the lateral and posterolateral right upper lobe (axial CT image 50, CT series 6). An additional stable 4 mm lateral right upper lobe pulmonary nodule is seen (axial CT image 44, CT series 6). A small, slightly patchy, nodular  appearing area is seen within the right upper lobe (axial CT images 61 through 67, CT series 6). This was described as a 7 mm irregular nodularity on the prior study, and is less distinct on the current exam (measures approximately 1.7 cm). A stable 3 mm anteromedial left upper lobe pulmonary nodule is seen (  axial CT image 61, CT series 6). A stable, 5 mm ground-glass pulmonary nodule is seen within the posterolateral aspect of the left upper lobe (axial CT image 74, CT series 6). A trace amount of pleural fluid is present, bilaterally. No pneumothorax is identified. Upper Abdomen: There is diffuse fatty infiltration of the liver parenchyma. An 8 mm nonobstructing left renal calculus is present. Musculoskeletal: No chest wall abnormality. No acute or significant osseous findings. Review of the MIP images confirms the above findings. IMPRESSION: 1. No evidence of pulmonary embolism. 2. Small pericardial effusion, increased in size when compared to the prior study. 3. Multiple bilateral pulmonary nodules, as described above, with a small, ill-defined nodular appearing area within the right upper lobe that is mildly increased in size when compared to the prior study. Further evaluation is recommended to exclude focal progression of neoplastic disease. 4. Trace amount of bilateral pleural fluid. 5. Hepatic steatosis. 6. 8 mm nonobstructing left renal calculus. 7. Aortic atherosclerosis. Electronically Signed   By: Suzen Dials M.D.   On: 06/13/2024 19:05   DG Abdomen 1 View Result Date: 06/13/2024 CLINICAL DATA:  Constipation.  Chest tightness. EXAM: ABDOMEN - 1 VIEW COMPARISON:  None Available. FINDINGS: There is a non obstructive bowel gas pattern. No supine evidence of free air. No organomegaly or suspicious calcification. No acute bony abnormality. Moderate stool burden. IMPRESSION: Moderate stool burden.  No acute findings. Electronically Signed   By: Franky Crease M.D.   On: 06/13/2024 17:41   DG Chest 2  View Result Date: 06/13/2024 CLINICAL DATA:  Chest pain EXAM: CHEST - 2 VIEW COMPARISON:  02/29/2024 FINDINGS: Heart and mediastinal contours are within normal limits. No focal opacities or effusions. No acute bony abnormality. IMPRESSION: No active cardiopulmonary disease. Electronically Signed   By: Franky Crease M.D.   On: 06/13/2024 17:41    Alm Schneider, DO  [facility]  If 7PM-7AM, please contact night-coverage [email or web address] Password [email or web address] 06/14/2024, 8:37 AM   LOS: 1 day

## 2024-06-14 NOTE — Plan of Care (Signed)
  Problem: Clinical Measurements: Goal: Will remain free from infection Outcome: Progressing   Problem: Activity: Goal: Risk for activity intolerance will decrease Outcome: Progressing   Problem: Coping: Goal: Level of anxiety will decrease Outcome: Progressing   Problem: Pain Managment: Goal: General experience of comfort will improve and/or be controlled Outcome: Progressing   Problem: Health Behavior/Discharge Planning: Goal: Ability to manage health-related needs will improve Outcome: Progressing

## 2024-06-15 ENCOUNTER — Other Ambulatory Visit (HOSPITAL_BASED_OUTPATIENT_CLINIC_OR_DEPARTMENT_OTHER): Payer: Self-pay

## 2024-06-15 DIAGNOSIS — R6510 Systemic inflammatory response syndrome (SIRS) of non-infectious origin without acute organ dysfunction: Secondary | ICD-10-CM | POA: Diagnosis not present

## 2024-06-15 DIAGNOSIS — C3491 Malignant neoplasm of unspecified part of right bronchus or lung: Secondary | ICD-10-CM | POA: Diagnosis not present

## 2024-06-15 DIAGNOSIS — E1165 Type 2 diabetes mellitus with hyperglycemia: Secondary | ICD-10-CM | POA: Diagnosis not present

## 2024-06-15 DIAGNOSIS — E230 Hypopituitarism: Secondary | ICD-10-CM

## 2024-06-15 LAB — CBC WITH DIFFERENTIAL/PLATELET
Basophils Absolute: 0 K/uL (ref 0.0–0.1)
Basophils Relative: 0 %
Eosinophils Absolute: 0 K/uL (ref 0.0–0.5)
Eosinophils Relative: 0 %
HCT: 32 % — ABNORMAL LOW (ref 39.0–52.0)
Hemoglobin: 9.5 g/dL — ABNORMAL LOW (ref 13.0–17.0)
Lymphocytes Relative: 10 %
Lymphs Abs: 1 K/uL (ref 0.7–4.0)
MCHC: 29.7 g/dL — ABNORMAL LOW (ref 30.0–36.0)
MCV: 84.9 fL (ref 80.0–100.0)
Monocytes Absolute: 0.2 K/uL (ref 0.1–1.0)
Monocytes Relative: 2 %
Neutro Abs: 7.5 K/uL (ref 1.7–7.7)
Neutrophils Relative %: 87 %
Platelets: ADEQUATE K/uL (ref 150–400)
RBC: 3.77 MIL/uL — ABNORMAL LOW (ref 4.22–5.81)
RDW: 19.9 % — ABNORMAL HIGH (ref 11.5–15.5)
WBC: 8.7 K/uL (ref 4.0–10.5)

## 2024-06-15 LAB — URINE CULTURE: Culture: NO GROWTH

## 2024-06-15 LAB — BASIC METABOLIC PANEL WITH GFR
Anion gap: 7 (ref 5–15)
BUN: 16 mg/dL (ref 8–23)
CO2: 23 mmol/L (ref 22–32)
Calcium: 9.2 mg/dL (ref 8.9–10.3)
Chloride: 107 mmol/L (ref 98–111)
Creatinine, Ser: 1.05 mg/dL (ref 0.61–1.24)
GFR, Estimated: >60 mL/min (ref >60)
Glucose, Bld: 142 mg/dL — ABNORMAL HIGH (ref 70–99)
Potassium: 3.6 mmol/L (ref 3.5–5.1)
Sodium: 137 mmol/L (ref 135–145)

## 2024-06-15 LAB — PHOSPHORUS: Phosphorus: 2.6 mg/dL (ref 2.5–4.6)

## 2024-06-15 LAB — MAGNESIUM: Magnesium: 2 mg/dL (ref 1.7–2.4)

## 2024-06-15 MED ORDER — CHLORPROMAZINE HCL 10 MG PO TABS
10.0000 mg | ORAL_TABLET | Freq: Three times a day (TID) | ORAL | Status: DC | PRN
  Administered 2024-06-15 – 2024-06-16 (×2): 10 mg via ORAL
  Filled 2024-06-15 (×2): qty 1

## 2024-06-15 MED ORDER — DEXAMETHASONE 4 MG PO TABS
2.0000 mg | ORAL_TABLET | Freq: Once | ORAL | Status: AC
  Administered 2024-06-16: 2 mg via ORAL
  Filled 2024-06-15: qty 1

## 2024-06-15 MED ORDER — DEXAMETHASONE 0.5 MG PO TABS
1.0000 mg | ORAL_TABLET | Freq: Every day | ORAL | Status: DC
  Filled 2024-06-15: qty 2

## 2024-06-15 MED ORDER — ORAL CARE MOUTH RINSE: 15.0000 mL | OROMUCOSAL | Status: DC | PRN

## 2024-06-15 NOTE — Progress Notes (Addendum)
 PROGRESS NOTE  AMAHRI DENGEL FMW:[id number] DOB: [birth year] DOA: 06/13/2024 PCP: [name], FNP  Brief History:  68 year old male with a history of stage IV NSCLC (dx 03/2021), adrenal insufficiency, hypothyroidism, depression, hyperlipidemia presenting with fevers, chills, generalized weakness and chest tightness for 1-2 days.  He has been feeling  tired since his last cycle of chemotherapy on 06/09/2024.  He denied any headache, neck pain, dizziness, visual disturbance. He has had a nonproductive cough and some chest congestion but denies any frank shortness of breath.  There is no hemoptysis.  He has had some chest tightness at rest.  It is improved in the morning of 06/14/2024.  He has not had any nausea, vomiting, diarrhea, abdominal pain.  He is still some abdominal bloating.  He is passing gas.  His last bowel movement was 2 days prior to admission.  There is no hematochezia or melena.  There is no dysuria or hematuria.  His oral intake has not been good for the past 2 days.  He endorses compliance with all his medications including his dexamethasone .  He has been on dexamethasone  1 mg daily for the last 2 years without any changes in his dosing.  He denies any rashes or synovitis.  He did have a low-grade temperature of 99.5 F at home.  In the ED, the patient had a low-grade temperature 99.2.   Oxygen saturation is 97% room air. He was hemodynamically stable but tachycardic in the 120s. WBC 11.4, hemoglobin 1.6, platelets 320. Sodium 137, potassium 3.3, bicarbonate 24, serum creatinine 1.21.  LFTs were unremarkable.  CTA chest was negative for PE.  Showed small pericardial effusion with slight increase in size compared to prior.  There is multiple bilateral pulmonary nodules.  There was an ill-defined nodular appearance to the area in the right upper lobe.  There is trace bilateral fluid.  The patient was started on Zosyn  and IV fluids.  Regarding his oncologic history, the patient  has been following Dr. Gatha.  He was diagnosed with NSCLC June 2022 when he presented with right upper lobe lung nodule in addition to right hilar, subcarinal and bilateral mediastinal as well as supraclavicular lymphadenopathy in addition to bone and brain metastasis   The patient underwent SRS to metastatic brain lesion under the care of Dr. Patrcia and he is currently undergoing systemic chemotherapy with carboplatin  for AUC of 5, Alimta  500 Mg/M2 and Keytruda  200 Mg IV every 3 weeks status post 51 cycles  He is currently Currently on cycle 53 of treatment, last treatment 06/09/2024.   Assessment/Plan: SIRS - Patient was tachycardic and tachypnea with leukocytosis. 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 Gatha   Depression -Continue Effexor    Steroid-induced diabetes -9/7 hemoglobin A1c 6.8 -NovoLog  sliding scale   Hypothyroidism -Continue  synthroid            Family Communication:   spouse updated   Consultants:  none   Code Status:  FULL    DVT Prophylaxis:  Cleves Lovenox      Procedures: As Listed in Progress Note Above    Antibiotics: Vanc 9/6>> Zosyn  9/6>>9/8         Subjective: Patient denies fevers, chills, headache, dyspnea, nausea, vomiting, diarrhea, abdominal pain, dysuria, hematuria, hematochezia, and melena. Chest pressure intermittent--overall improved  Objective: Vitals:   06/14/24 1428 06/14/24 2120 06/15/24 0424 06/15/24 1217  BP: (!) 132/90 (!) 143/90 (!) 161/97 134/85  Pulse: (!) 106 (!) 101 99   Resp: 19 18 18 17   Temp: 98.5 F (36.9 C) 98.5 F (36.9 C) 98.1 F (36.7 C) 98.3 F (36.8 C)  TempSrc: Oral Oral Oral Oral  SpO2: 95% 100% 98% 99%  Weight:      Height:        Intake/Output Summary (Last 24 hours) at 06/15/2024 1330 Last data filed at 06/14/2024 2204 Gross per 24 hour  Intake 1170.18 ml  Output --  Net 1170.18 ml   Weight change:  Exam:  General:  Pt is alert, follows commands appropriately, not in acute distress HEENT: No icterus, No thrush, No neck mass, Victor/AT Cardiovascular: RRR, S1/S2, no rubs, no gallops Respiratory: basilar rales. No wheeze Abdomen: Soft/+BS, non tender, non distended, no guarding Extremities: 1 + LE edema, No lymphangitis, No petechiae, No rashes, no synovitis   Data Reviewed: I have personally reviewed following labs and imaging studies Basic Metabolic Panel: Recent Labs  Lab 06/09/24 0835 06/13/24 1641 06/13/24 1747 06/14/24 0359 06/15/24 0421  NA 143 137  --  135 137  K 3.7 3.3*  --  3.9 3.6  CL 106 101  --  105 107  CO2 29 24  --  22 23  GLUCOSE 127* 126*  --  117* 142*  BUN 17 15  --  12 16  CREATININE 0.96 1.21  --  1.07 1.05  CALCIUM  9.1 9.0  --  8.4* 9.2  MG  --   --  1.9 1.9 2.0  PHOS  --   --   --  3.0 2.6   Liver Function Tests: Recent Labs  Lab 06/09/24 0835 06/13/24 1700 06/14/24 0359  AST 21 26 22   ALT 25 23 18   ALKPHOS 68 67 56  BILITOT 0.3 1.2 1.2  PROT 6.7 6.6 5.9*  ALBUMIN 3.7 3.1* 2.5*   Recent Labs  Lab 06/13/24 1700  LIPASE 27   No results for input(s): AMMONIA in the last 168  hours. Coagulation Profile: No results for input(s): INR, PROTIME in the last 168 hours. CBC: Recent Labs  Lab 06/09/24 0835 06/13/24 1641 06/14/24 0359 06/15/24 0421  WBC 9.1 11.4* 10.8* 8.7  NEUTROABS 6.7  --   --  7.5  HGB 11.2* 10.6* 9.3* 9.5*  HCT 37.5* 35.8* 30.8* 32.0*  MCV 84.3 85.2 85.8 84.9  PLT 355 320 269 PLATELET CLUMPS NOTED ON SMEAR, COUNT APPEARS ADEQUATE   Cardiac Enzymes: Recent Labs  Lab 06/14/24 0901  CKTOTAL 43*   BNP: Invalid input(s): POCBNP CBG: No results for input(s): GLUCAP in the last 168 hours. HbA1C: Recent Labs    06/14/24 0901  HGBA1C 6.8*   Urine analysis:    Component Value Date/Time   COLORURINE STRAW (A) 06/14/2024 1000   APPEARANCEUR CLEAR 06/14/2024 1000   APPEARANCEUR Clear 12/11/2017 1117   LABSPEC 1.012 06/14/2024 1000  PHURINE 7.0 06/14/2024 1000   GLUCOSEU NEGATIVE 06/14/2024 1000   HGBUR NEGATIVE 06/14/2024 1000   BILIRUBINUR NEGATIVE 06/14/2024 1000   BILIRUBINUR Negative 12/11/2017 1117   KETONESUR NEGATIVE 06/14/2024 1000   PROTEINUR NEGATIVE 06/14/2024 1000   UROBILINOGEN negative 07/13/2014 1059   NITRITE NEGATIVE 06/14/2024 1000   LEUKOCYTESUR NEGATIVE 06/14/2024 1000   Sepsis Labs: @LABRCNTIP (procalcitonin:4,lacticidven:4) ) Recent Results (from the past 240 hours)  Resp panel by RT-PCR (RSV, Flu A&B, Covid) Anterior Nasal Swab     Status: None   Collection Time: 06/13/24  4:53 PM   Specimen: Anterior Nasal Swab  Result Value Ref Range Status   SARS Coronavirus 2 by RT PCR NEGATIVE NEGATIVE Final    Comment: (NOTE) SARS-CoV-2 target nucleic acids are NOT DETECTED.  The SARS-CoV-2 RNA is generally detectable in upper respiratory specimens during the acute phase of infection. The lowest concentration of SARS-CoV-2 viral copies this assay can detect is 138 copies/mL. A negative result does not preclude SARS-Cov-2 infection and should not be used as the sole basis for treatment or other patient  management decisions. A negative result may occur with  improper specimen collection/handling, submission of specimen other than nasopharyngeal swab, presence of viral mutation(s) within the areas targeted by this assay, and inadequate number of viral copies(<138 copies/mL). A negative result must be combined with clinical observations, patient history, and epidemiological information. The expected result is Negative.  Fact Sheet for Patients:  BloggerCourse.com  Fact Sheet for Healthcare Providers:  SeriousBroker.it  This test is no t yet approved or cleared by the [place]  FDA and  has been authorized for detection and/or diagnosis of SARS-CoV-2 by FDA under an Emergency Use Authorization (EUA). This EUA will remain  in effect (meaning this test can be used) for the duration of the COVID-19 declaration under Section 564(b)(1) of the Act, 21 U.S.C.section 360bbb-3(b)(1), unless the authorization is terminated  or revoked sooner.       Influenza A by PCR NEGATIVE NEGATIVE Final   Influenza B by PCR NEGATIVE NEGATIVE Final    Comment: (NOTE) The Xpert Xpress SARS-CoV-2/FLU/RSV plus assay is intended as an aid in the diagnosis of influenza from Nasopharyngeal swab specimens and should not be used as a sole basis for treatment. Nasal washings and aspirates are unacceptable for Xpert Xpress SARS-CoV-2/FLU/RSV testing.  Fact Sheet for Patients: BloggerCourse.com  Fact Sheet for Healthcare Providers: SeriousBroker.it  This test is not yet approved or cleared by the [place]  FDA and has been authorized for detection and/or diagnosis of SARS-CoV-2 by FDA under an Emergency Use Authorization (EUA). This EUA will remain in effect (meaning this test can be used) for the duration of the COVID-19 declaration under Section 564(b)(1) of the Act, 21 U.S.C. section 360bbb-3(b)(1),  unless the authorization is terminated or revoked.     Resp Syncytial Virus by PCR NEGATIVE NEGATIVE Final    Comment: (NOTE) Fact Sheet for Patients: BloggerCourse.com  Fact Sheet for Healthcare Providers: SeriousBroker.it  This test is not yet approved or cleared by the [place]  FDA and has been authorized for detection and/or diagnosis of SARS-CoV-2 by FDA under an Emergency Use Authorization (EUA). This EUA will remain in effect (meaning this test can be used) for the duration of the COVID-19 declaration under Section 564(b)(1) of the Act, 21 U.S.C. section 360bbb-3(b)(1), unless the authorization is terminated or revoked.  Performed at [facility], [address], [place], [place] [address]   Culture, blood (routine x 2)  Status: None (Preliminary result)   Collection Time: 06/13/24  5:47 PM   Specimen: BLOOD RIGHT ARM  Result Value Ref Range Status   Specimen Description   Final    BLOOD RIGHT ARM BOTTLES DRAWN AEROBIC AND ANAEROBIC   Special Requests Blood Culture adequate volume  Final   Culture   Final    NO GROWTH 2 DAYS Performed at [facility], [address], [place], [place] [address]    Report Status PENDING  Incomplete  Culture, blood (routine x 2)     Status: None (Preliminary result)   Collection Time: 06/13/24  5:55 PM   Specimen: Left Antecubital; Blood  Result Value Ref Range Status   Specimen Description   Final    LEFT ANTECUBITAL BOTTLES DRAWN AEROBIC AND ANAEROBIC   Special Requests   Final    Blood Culture results may not be optimal due to an inadequate volume of blood received in culture bottles   Culture   Final    NO GROWTH 2 DAYS Performed at [facility], [address], [place], [place] [address]    Report Status PENDING  Incomplete  Respiratory (~20 pathogens) panel by PCR     Status: None   Collection Time: 06/14/24 10:00 AM   Specimen: Nasopharyngeal Swab; Respiratory  Result  Value Ref Range Status   Adenovirus NOT DETECTED NOT DETECTED Final   Coronavirus 229E NOT DETECTED NOT DETECTED Final    Comment: (NOTE) The Coronavirus on the Respiratory Panel, DOES NOT test for the novel  Coronavirus (2019 nCoV)    Coronavirus HKU1 NOT DETECTED NOT DETECTED Final   Coronavirus NL63 NOT DETECTED NOT DETECTED Final   Coronavirus OC43 NOT DETECTED NOT DETECTED Final   Metapneumovirus NOT DETECTED NOT DETECTED Final   Rhinovirus / Enterovirus NOT DETECTED NOT DETECTED Final   Influenza A NOT DETECTED NOT DETECTED Final   Influenza B NOT DETECTED NOT DETECTED Final   Parainfluenza Virus 1 NOT DETECTED NOT DETECTED Final   Parainfluenza Virus 2 NOT DETECTED NOT DETECTED Final   Parainfluenza Virus 3 NOT DETECTED NOT DETECTED Final   Parainfluenza Virus 4 NOT DETECTED NOT DETECTED Final   Respiratory Syncytial Virus NOT DETECTED NOT DETECTED Final   Bordetella pertussis NOT DETECTED NOT DETECTED Final   Bordetella Parapertussis NOT DETECTED NOT DETECTED Final   Chlamydophila pneumoniae NOT DETECTED NOT DETECTED Final   Mycoplasma pneumoniae NOT DETECTED NOT DETECTED Final    Comment: Performed at [facility], [address] [place], [place] [address]     Scheduled Meds:  cholecalciferol   1,000 Units Oral QPM   [START ON 06/16/2024] dexamethasone   1 mg Oral Daily   enoxaparin  (LOVENOX ) injection  40 mg Subcutaneous Q24H   famotidine   10 mg Oral QHS   feeding supplement  237 mL Oral BID BM   folic acid   1 mg Oral QHS   insulin  aspart  0-9 Units Subcutaneous TID WC   levothyroxine   100 mcg Oral Q0600   mirtazapine   15 mg Oral QHS   multivitamin with minerals  1 tablet Oral QHS   pantoprazole   40 mg Oral QPM   polyethylene glycol  17 g Oral Daily   rosuvastatin   20 mg Oral QHS   venlafaxine  XR  150 mg Oral QHS   Continuous Infusions:  lactated ringers      piperacillin -tazobactam (ZOSYN )  IV 3.375 g (06/15/24 0615)   vancomycin  1,750 mg (06/15/24 1059)     Procedures/Studies: ECHOCARDIOGRAM COMPLETE Result Date: 06/14/2024  ECHOCARDIOGRAM REPORT   Patient Name:   HYMEN ARNETT Date of Exam: 06/14/2024 Medical Rec #:  [id number]       Height:       71.0 in Accession #:    [id number]      Weight:       190.0 lb Date of Birth:  08/05/1956        BSA:          2.063 m Patient Age:    68 years        BP:           114/79 mmHg Patient Gender: M               HR:           103 bpm. Exam Location:  [facility] Procedure: 2D Echo, Color Doppler and Cardiac Doppler (Both Spectral and Color            Flow Doppler were utilized during procedure). Indications:    Percardial Effusion I31.3  History:        Patient has prior history of Echocardiogram examinations, most                 recent 10/13/2016. Risk Factors:Diabetes, Dyslipidemia,                 Hypertension and Former Smoker.  Sonographer:    Koleen Popper RDCS Referring Phys: [phone] Payslie Mccaig IMPRESSIONS  1. Left ventricular ejection fraction, by estimation, is 55 to 60%. The left ventricle has normal function. The left ventricle has no regional wall motion abnormalities. Left ventricular diastolic parameters are indeterminate.  2. Right ventricular systolic function is normal. The right ventricular size is normal. Tricuspid regurgitation signal is inadequate for assessing PA pressure.  3. Moderate pericardial effusion. The pericardial effusion is circumferential. No diagnostic respiratory variation in mitral or tricuspid inflow to suggest tamponade. There is mild, early diastolic undulation in the RA and RV free wall - consider followup imaging to reassess effusion size, particularly if clinical status worsens.  4. The mitral valve is grossly normal. Trivial mitral valve regurgitation.  5. The aortic valve is tricuspid. There is mild calcification of the aortic valve. Aortic valve regurgitation is trivial.  6. The inferior vena cava is normal in size with greater than 50% respiratory variability, suggesting right  atrial pressure of 3 mmHg. Comparison(s): Prior images unable to be directly viewed. FINDINGS  Left Ventricle: Left ventricular ejection fraction, by estimation, is 55 to 60%. The left ventricle has normal function. The left ventricle has no regional wall motion abnormalities. The left ventricular internal cavity size was normal in size. There is  borderline left ventricular hypertrophy. Left ventricular diastolic parameters are indeterminate. Right Ventricle: The right ventricular size is normal. No increase in right ventricular wall thickness. Right ventricular systolic function is normal. Tricuspid regurgitation signal is inadequate for assessing PA pressure. Left Atrium: Left atrial size was normal in size. Right Atrium: Right atrial size was normal in size. Pericardium: A moderately sized pericardial effusion is present. The pericardial effusion is circumferential. Mitral Valve: The mitral valve is grossly normal. Trivial mitral valve regurgitation. Tricuspid Valve: The tricuspid valve is grossly normal. Tricuspid valve regurgitation is trivial. Aortic Valve: The aortic valve is tricuspid. There is mild calcification of the aortic valve. Aortic valve regurgitation is trivial. Aortic valve peak gradient measures 12.7 mmHg. Pulmonic Valve: The pulmonic valve was not well visualized. Pulmonic valve regurgitation is trivial. Aorta: The aortic root  and ascending aorta are structurally normal, with no evidence of dilitation. Venous: The inferior vena cava is normal in size with greater than 50% respiratory variability, suggesting right atrial pressure of 3 mmHg. IAS/Shunts: No atrial level shunt detected by color flow Doppler. Additional Comments: 3D was performed not requiring image post processing on an independent workstation and was indeterminate.  LEFT VENTRICLE PLAX 2D LVIDd:         4.40 cm   Diastology LVIDs:         3.20 cm   LV e' medial:    5.33 cm/s LV PW:         1.00 cm   LV E/e' medial:  17.3 LV IVS:         1.20 cm   LV e' lateral:   5.87 cm/s LVOT diam:     2.20 cm   LV E/e' lateral: 15.7 LV SV:         70 LV SV Index:   34 LVOT Area:     3.80 cm  RIGHT VENTRICLE             IVC RV Basal diam:  3.80 cm     IVC diam: 1.80 cm RV S prime:     15.00 cm/s TAPSE (M-mode): 2.1 cm LEFT ATRIUM             Index        RIGHT ATRIUM           Index LA diam:        3.50 cm 1.70 cm/m   RA Area:     14.30 cm LA Vol (A2C):   37.8 ml 18.32 ml/m  RA Volume:   32.70 ml  15.85 ml/m LA Vol (A4C):   37.8 ml 18.32 ml/m LA Biplane Vol: 38.2 ml 18.52 ml/m  AORTIC VALVE AV Area (Vmax): 2.37 cm AV Vmax:        178.00 cm/s AV Peak Grad:   12.7 mmHg LVOT Vmax:      111.00 cm/s LVOT Vmean:     73.900 cm/s LVOT VTI:       0.183 m  AORTA Ao Root diam: 3.20 cm Ao Asc diam:  3.60 cm MITRAL VALVE MV Area (PHT): 5.84 cm     SHUNTS MV Decel Time: 130 msec     Systemic VTI:  0.18 m MV E velocity: 92.00 cm/s   Systemic Diam: 2.20 cm MV A velocity: 134.00 cm/s MV E/A ratio:  0.69 Jayson Sierras MD Electronically signed by Jayson Sierras MD Signature Date/Time: 06/14/2024/11:13:48 AM    Final    CT Angio Chest PE W/Cm &/Or Wo Cm Result Date: 06/13/2024 CLINICAL DATA:  Weakness and chest tightness. EXAM: CT ANGIOGRAPHY CHEST WITH CONTRAST TECHNIQUE: Multidetector CT imaging of the chest was performed using the standard protocol during bolus administration of intravenous contrast. Multiplanar CT image reconstructions and MIPs were obtained to evaluate the vascular anatomy. RADIATION DOSE REDUCTION: This exam was performed according to the departmental dose-optimization program which includes automated exposure control, adjustment of the mA and/or kV according to patient size and/or use of iterative reconstruction technique. CONTRAST:  75mL OMNIPAQUE  IOHEXOL  350 MG/ML SOLN COMPARISON:  None Available. FINDINGS: Cardiovascular: There is moderate to marked severity calcification of the aortic arch, without evidence of aortic aneurysm.  Satisfactory opacification of the pulmonary arteries to the segmental level. No evidence of pulmonary embolism. Normal heart size with mild coronary artery calcification. There is a small pericardial effusion which measures  1.3 cm in maximum thickness and is increased in size when compared to the prior study. Mediastinum/Nodes: No enlarged mediastinal, hilar, or axillary lymph nodes. Thyroid  gland, trachea, and esophagus demonstrate no significant findings. Lungs/Pleura: Stable, ill-defined 5 mm and 7 mm nodular appearing areas are seen along the periphery of the lateral and posterolateral right upper lobe (axial CT image 50, CT series 6). An additional stable 4 mm lateral right upper lobe pulmonary nodule is seen (axial CT image 44, CT series 6). A small, slightly patchy, nodular appearing area is seen within the right upper lobe (axial CT images 61 through 67, CT series 6). This was described as a 7 mm irregular nodularity on the prior study, and is less distinct on the current exam (measures approximately 1.7 cm). A stable 3 mm anteromedial left upper lobe pulmonary nodule is seen (axial CT image 61, CT series 6). A stable, 5 mm ground-glass pulmonary nodule is seen within the posterolateral aspect of the left upper lobe (axial CT image 74, CT series 6). A trace amount of pleural fluid is present, bilaterally. No pneumothorax is identified. Upper Abdomen: There is diffuse fatty infiltration of the liver parenchyma. An 8 mm nonobstructing left renal calculus is present. Musculoskeletal: No chest wall abnormality. No acute or significant osseous findings. Review of the MIP images confirms the above findings. IMPRESSION: 1. No evidence of pulmonary embolism. 2. Small pericardial effusion, increased in size when compared to the prior study. 3. Multiple bilateral pulmonary nodules, as described above, with a small, ill-defined nodular appearing area within the right upper lobe that is mildly increased in size when  compared to the prior study. Further evaluation is recommended to exclude focal progression of neoplastic disease. 4. Trace amount of bilateral pleural fluid. 5. Hepatic steatosis. 6. 8 mm nonobstructing left renal calculus. 7. Aortic atherosclerosis. Electronically Signed   By: Suzen Dials M.D.   On: 06/13/2024 19:05   DG Abdomen 1 View Result Date: 06/13/2024 CLINICAL DATA:  Constipation.  Chest tightness. EXAM: ABDOMEN - 1 VIEW COMPARISON:  None Available. FINDINGS: There is a non obstructive bowel gas pattern. No supine evidence of free air. No organomegaly or suspicious calcification. No acute bony abnormality. Moderate stool burden. IMPRESSION: Moderate stool burden.  No acute findings. Electronically Signed   By: Franky Crease M.D.   On: 06/13/2024 17:41   DG Chest 2 View Result Date: 06/13/2024 CLINICAL DATA:  Chest pain EXAM: CHEST - 2 VIEW COMPARISON:  02/29/2024 FINDINGS: Heart and mediastinal contours are within normal limits. No focal opacities or effusions. No acute bony abnormality. IMPRESSION: No active cardiopulmonary disease. Electronically Signed   By: Franky Crease M.D.   On: 06/13/2024 17:41    Alm Schneider, DO  [facility]  If 7PM-7AM, please contact night-coverage [email or web address] Password [email or web address] 06/15/2024, 1:30 PM   LOS: 2 days

## 2024-06-15 NOTE — Progress Notes (Signed)
 Physical Therapy Discharge Patient Details Name: Bradley Goodman MRN: [id number] DOB: 1955-11-09 Today's Date: 06/15/2024 Time:  -     Patient discharged from PT services secondary to PT stating that he is walking on his own and has not trouble denies needing skilled therapy.   Montie Metro, PT CLT [phone]  06/15/2024, 9:55 AM

## 2024-06-15 NOTE — Plan of Care (Signed)
   Problem: Education: Goal: Knowledge of General Education information will improve Description: Including pain rating scale, medication(s)/side effects and non-pharmacologic comfort measures Outcome: Progressing   Problem: Activity: Goal: Risk for activity intolerance will decrease Outcome: Progressing   Problem: Nutrition: Goal: Adequate nutrition will be maintained Outcome: Progressing

## 2024-06-15 NOTE — Progress Notes (Signed)
 OT Cancellation Note  Patient Details Name: Bradley Goodman MRN: [id number] DOB: 26-Feb-1956   Cancelled Treatment:    Reason Eval/Treat Not Completed: OT screened, no needs identified, will sign off. Pt reports he has no issues taking care of himself and has been moving about the room on his own. Pt will be removed from the OT list.   [name] OT, MOT   [name] 06/15/2024, 5:01 PM

## 2024-06-16 ENCOUNTER — Inpatient Hospital Stay (HOSPITAL_COMMUNITY)

## 2024-06-16 ENCOUNTER — Other Ambulatory Visit (HOSPITAL_COMMUNITY): Payer: Self-pay | Admitting: *Deleted

## 2024-06-16 DIAGNOSIS — R6510 Systemic inflammatory response syndrome (SIRS) of non-infectious origin without acute organ dysfunction: Secondary | ICD-10-CM | POA: Diagnosis not present

## 2024-06-16 DIAGNOSIS — A419 Sepsis, unspecified organism: Secondary | ICD-10-CM | POA: Diagnosis not present

## 2024-06-16 DIAGNOSIS — I3139 Other pericardial effusion (noninflammatory): Secondary | ICD-10-CM

## 2024-06-16 DIAGNOSIS — E876 Hypokalemia: Secondary | ICD-10-CM

## 2024-06-16 DIAGNOSIS — K760 Fatty (change of) liver, not elsewhere classified: Secondary | ICD-10-CM | POA: Diagnosis not present

## 2024-06-16 DIAGNOSIS — C3491 Malignant neoplasm of unspecified part of right bronchus or lung: Secondary | ICD-10-CM | POA: Diagnosis not present

## 2024-06-16 DIAGNOSIS — N200 Calculus of kidney: Secondary | ICD-10-CM | POA: Diagnosis not present

## 2024-06-16 DIAGNOSIS — R109 Unspecified abdominal pain: Secondary | ICD-10-CM | POA: Diagnosis not present

## 2024-06-16 DIAGNOSIS — I10 Essential (primary) hypertension: Secondary | ICD-10-CM | POA: Diagnosis not present

## 2024-06-16 DIAGNOSIS — E785 Hyperlipidemia, unspecified: Secondary | ICD-10-CM | POA: Diagnosis not present

## 2024-06-16 LAB — HEPATIC FUNCTION PANEL
ALT: 27 U/L (ref 0–44)
AST: 41 U/L (ref 15–41)
Albumin: 2.7 g/dL — ABNORMAL LOW (ref 3.5–5.0)
Alkaline Phosphatase: 47 U/L (ref 38–126)
Bilirubin, Direct: 0.2 mg/dL (ref 0.0–0.2)
Indirect Bilirubin: 0.2 mg/dL — ABNORMAL LOW (ref 0.3–0.9)
Total Bilirubin: 0.4 mg/dL (ref 0.0–1.2)
Total Protein: 5.9 g/dL — ABNORMAL LOW (ref 6.5–8.1)

## 2024-06-16 LAB — BASIC METABOLIC PANEL WITH GFR
Anion gap: 8 (ref 5–15)
BUN: 14 mg/dL (ref 8–23)
CO2: 22 mmol/L (ref 22–32)
Calcium: 8.4 mg/dL — ABNORMAL LOW (ref 8.9–10.3)
Chloride: 107 mmol/L (ref 98–111)
Creatinine, Ser: 1.05 mg/dL (ref 0.61–1.24)
GFR, Estimated: >60 mL/min (ref >60)
Glucose, Bld: 94 mg/dL (ref 70–99)
Potassium: 3.8 mmol/L (ref 3.5–5.1)
Sodium: 137 mmol/L (ref 135–145)

## 2024-06-16 LAB — ECHOCARDIOGRAM LIMITED
Height: 71 in
S' Lateral: 3 cm
Weight: 3040 [oz_av]

## 2024-06-16 LAB — CBC
HCT: 29.4 % — ABNORMAL LOW (ref 39.0–52.0)
Hemoglobin: 8.6 g/dL — ABNORMAL LOW (ref 13.0–17.0)
MCH: 25.6 pg — ABNORMAL LOW (ref 26.0–34.0)
MCHC: 29.3 g/dL — ABNORMAL LOW (ref 30.0–36.0)
MCV: 87.5 fL (ref 80.0–100.0)
Platelets: 188 K/uL (ref 150–400)
RBC: 3.36 MIL/uL — ABNORMAL LOW (ref 4.22–5.81)
RDW: 20 % — ABNORMAL HIGH (ref 11.5–15.5)
WBC: 4 K/uL (ref 4.0–10.5)
nRBC: 1 % — ABNORMAL HIGH (ref 0.0–0.2)

## 2024-06-16 LAB — C-REACTIVE PROTEIN: CRP: 7 mg/dL — ABNORMAL HIGH (ref <1.0)

## 2024-06-16 LAB — MAGNESIUM: Magnesium: 1.9 mg/dL (ref 1.7–2.4)

## 2024-06-16 LAB — SEDIMENTATION RATE: Sed Rate: 74 mm/h — ABNORMAL HIGH (ref 0–20)

## 2024-06-16 LAB — LIPASE, BLOOD: Lipase: 29 U/L (ref 11–51)

## 2024-06-16 MED ORDER — DOXYCYCLINE HYCLATE 100 MG PO TABS: 100.0000 mg | ORAL_TABLET | Freq: Two times a day (BID) | ORAL | 0 refills | Status: DC

## 2024-06-16 MED ORDER — IOHEXOL 300 MG/ML  SOLN
100.0000 mL | Freq: Once | INTRAMUSCULAR | Status: AC | PRN
  Administered 2024-06-16: 100 mL via INTRAVENOUS

## 2024-06-16 MED ORDER — CEPHALEXIN 500 MG PO CAPS: 500.0000 mg | ORAL_CAPSULE | Freq: Three times a day (TID) | ORAL | 0 refills | Status: DC

## 2024-06-16 MED ORDER — CEPHALEXIN 500 MG PO CAPS: 500.0000 mg | ORAL_CAPSULE | Freq: Three times a day (TID) | ORAL | Status: DC

## 2024-06-16 MED ORDER — PANTOPRAZOLE SODIUM 40 MG IV SOLR
40.0000 mg | Freq: Once | INTRAVENOUS | Status: AC
  Administered 2024-06-16: 40 mg via INTRAVENOUS
  Filled 2024-06-16: qty 10

## 2024-06-16 MED ORDER — DOXYCYCLINE HYCLATE 100 MG PO TABS: 100.0000 mg | ORAL_TABLET | Freq: Two times a day (BID) | ORAL | Status: DC

## 2024-06-16 NOTE — Progress Notes (Signed)
*  PRELIMINARY RESULTS* Echocardiogram Limited 2-D Echocardiogram has been performed.  Bradley Goodman 06/16/2024, 2:46 PM

## 2024-06-16 NOTE — Consult Note (Signed)
 Cardiology Consultation   Patient ID: Bradley Goodman MRN: [id number]; DOB: [birth year]  Admit date: 06/13/2024 Date of Consult: 06/16/2024  PCP:  [name], FNP   [facility] Providers Cardiologist:  None   Patient Profile: Bradley Goodman is a 68 y.o. male with a hx of stage IV NSCLC (dx 03/2021), adrenal insufficiency, hypothyroidism, depression, HTN, HLD who is being seen 06/16/2024 for the evaluation of pericardial effusion at the request of Dr. Evonnie.  History of Present Illness: Bradley Goodman was last seen by heart care in 2018 by Dr. Debera.  He has been lost to follow-up since that time. Stress echo in 2018 was normal without any signs of ischemia.  Presented to [facility] ED on 9/6 for fevers, chills, generalized weakness, chest tightness, nonproductive cough.  Abnormal labs include K 3 now 3.8, Hgb 10.6 now 8.6, WBC 11.4 now 4, D-dimer 1.67, TSH 0.106.  Normal labs include CR 1.21 now 1.05, Mg 1.9, TN 7 >6, respiratory viral panel, procalcitonin 0.12, lactic acid 1.3 EKG: Sinus tachycardia, HR 129 nonspecific T wave abnormalities CXR with no active disease. ECHO 06/2024 showed EF 55 to 60%, moderate pericardial effusion that is circumferential, trivial MV regurgitation, trivial AV regurgitation, mild AV calcification. Abdominal x-ray with moderate stool burden. CTA with no evidence of PE, small pericardial effusion that increased in size, multiple bilateral pulmonary nodules, trace amount of bilateral pleural fluid, hepatic stenosis, 8 mm nonobstructing left renal calculus, aortic atherosclerosis. CT A/P showed possible mild enteritis, hepatic steatosis, nonobstructing left nephrolithiasis without hydronephrosis, moderate colonic stool burden.  MRI brain pending. SIRS criteria was positive, however lactic acid WNL, procalcitonin WNL UA without pyuria, negative blood culture, therefore did not meet sepsis criteria. Treated with IV ABX, LR bolus/infusion and NaCl bolus.   On  interview, patient reports episode of constant chest tightness on Saturday for 4 hours, 3/10 that resolved spontaneously on its own.  Pain was not affected by position changes.  Associated with low-grade fever of 99.5.  Also notes chronic edema that has been unchanged.  Also notes chronic SOB with moderate exertion that has been unchanged.  Patient's last chemotherapy treatment was the Tuesday prior to this episode.  Currently, no CP or SOB.  Patient has been able to walk to restroom without any concerns.  Denies palpitations, fatigue, cough, orthopnea, trauma, syncope.  Relevant history includes malignancy and radiation.  Patient is able to golf 18 holes and gardening without any chest pain but occasional SOB with more moderate exertion.  Endorses heart healthy diet with low sodium.  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   PONV (postoperative nausea and vomiting)    Type 2 diabetes mellitus (HCC)     Past Surgical History:  Procedure Laterality Date   Bilateral inguinal hernia repair     BRONCHIAL NEEDLE ASPIRATION BIOPSY  04/05/2021   Procedure: BRONCHIAL NEEDLE ASPIRATION BIOPSIES;  Surgeon: Brenna Adine CROME, DO;  Location: [facility];  Service: Pulmonary;;   COLONOSCOPY  01/23/2012   Procedure: COLONOSCOPY;  Surgeon: Lamar HERO Hollingshead, MD;  Location: [facility];  Service: Endoscopy;  Laterality: N/A;  9:30 AM   COLONOSCOPY N/A 10/11/2015   Procedure: COLONOSCOPY;  Surgeon: Lamar HERO Hollingshead, MD;  Location:  [facility];  Service: Endoscopy;  Laterality: N/A;  830   COLONOSCOPY N/A 10/14/2019   Procedure: COLONOSCOPY;  Surgeon: Shaaron Lamar HERO, MD;  Location: [facility];  Service: Endoscopy;  Laterality: N/A;  1:45   POLYPECTOMY  10/14/2019   Procedure: POLYPECTOMY;  Surgeon:  Shaaron Lamar HERO, MD;  Location: [facility];  Service: Endoscopy;;  ascending colon, descending colon   VIDEO BRONCHOSCOPY WITH ENDOBRONCHIAL ULTRASOUND N/A 04/05/2021   Procedure: VIDEO BRONCHOSCOPY WITH ENDOBRONCHIAL ULTRASOUND;  Surgeon: Brenna Adine CROME, DO;  Location: [facility];  Service: Pulmonary;  Laterality: N/A;     Home Medications:  Prior to Admission medications   Medication Sig Start Date End Date Taking? Authorizing Provider  acetaminophen  (TYLENOL ) 500 MG tablet Take 500 mg by mouth every 6 (six) hours as needed for mild pain (pain score 1-3) or fever.   Yes [provider]  chlorproMAZINE  (THORAZINE ) 10 MG tablet Take 1 tablet (10 mg total) by mouth 3 (three) times daily as needed for hiccoughs. 11/27/23  Yes [name], [name], FNP  cholecalciferol  (VITAMIN D ) 1000 UNITS tablet Take 1,000 Units by mouth every evening.   Yes [provider]  dexamethasone  (DECADRON ) 1 MG tablet Take 1 tablet (1 mg total) by mouth daily with breakfast. 05/04/24  Yes Nida, Gebreselassie W, MD  famotidine  (PEPCID ) 10 MG tablet Take 10 mg by mouth at bedtime. Chewable   Yes [provider]  folic acid  (FOLVITE ) 1 MG tablet Take 1 tablet (1 mg total) by mouth daily. Patient taking differently: Take 1 mg by mouth at bedtime. 04/03/24  Yes [name], [name], FNP  Krill Oil 300 MG CAPS Take 300 mg by mouth every evening.   Yes [provider]  levothyroxine  (SYNTHROID ) 100 MCG tablet Take 1 tablet (100 mcg total) by mouth daily before breakfast. 03/10/24  Yes Nida, Gebreselassie W, MD  loratadine  (CLARITIN ) 10 MG tablet Take 10 mg by mouth every evening.    Yes [provider]  losartan  (COZAAR ) 50 MG tablet Take 1.5 tablets (75 mg total) by mouth daily. Patient taking differently: Take 75 mg by mouth at bedtime. 04/03/24  Yes Gottschalk, Norene M, DO  mirtazapine  (REMERON ) 15 MG tablet Take 1 tablet (15 mg total) by mouth at bedtime. 04/22/24  Yes [name], [name], FNP   Multiple Vitamins-Minerals (ONE A DAY MEN 50 PLUS) TABS Take 1 tablet by mouth at bedtime.   Yes [provider]  ondansetron  (ZOFRAN ) 4 MG tablet Take 1 tablet (4 mg total) by mouth every 6 (six) hours as needed for nausea. 01/24/24  Yes Shah, Pratik D, DO  pantoprazole  (PROTONIX ) 40 MG tablet Take 1 tablet (40 mg total) by mouth every evening. 04/14/24  Yes [name], [name], FNP  polyethylene glycol powder (GLYCOLAX /MIRALAX ) 17 GM/SCOOP powder Take 119 g by mouth daily as needed for mild constipation or moderate constipation.   Yes [provider]  rosuvastatin  (CRESTOR ) 20 MG tablet Take 1 tablet (20 mg total) by mouth daily. Patient taking differently: Take 20 mg by mouth at bedtime. 04/14/24  Yes [name], [name], FNP  testosterone  cypionate (DEPOTESTOTERONE CYPIONATE) 100 MG/ML injection Take 50mg  ( 0.9ml) and 100mg  ( 1ml) into muscle alternatively weekly. 03/10/24  Yes Nida, Gebreselassie W, MD  venlafaxine  XR (EFFEXOR -XR) 75 MG 24 hr capsule Take 2 capsules (150 mg total) by mouth daily with breakfast. Patient taking differently: Take 150 mg by mouth at bedtime. 04/22/24  Yes [name], [name], FNP  Continuous  Glucose Sensor (FREESTYLE LIBRE 3 PLUS SENSOR) MISC Change sensor every 15 days. 03/10/24   [name], [name], FNP  SYRINGE-NEEDLE, DISP, 3 ML (B-D 3CC LUER-LOK SYR 21GX1-1/2) 21G X 1-1/2 3 ML MISC Use to inject testosterone  every week 03/10/24   Nida, Gebreselassie W, MD    Scheduled Meds:  cholecalciferol   1,000 Units Oral QPM   [START ON 06/17/2024] dexamethasone   1 mg Oral Daily   dexamethasone   2 mg Oral Once   enoxaparin  (LOVENOX ) injection  40 mg Subcutaneous Q24H   famotidine   10 mg Oral QHS   feeding supplement  237 mL Oral BID BM   folic acid   1 mg Oral QHS   insulin  aspart  0-9 Units Subcutaneous TID WC   levothyroxine   100 mcg Oral Q0600   mirtazapine   15 mg Oral QHS   multivitamin with minerals  1 tablet Oral QHS   pantoprazole   40 mg Oral QPM   polyethylene glycol   17 g Oral Daily   rosuvastatin   20 mg Oral QHS   venlafaxine  XR  150 mg Oral QHS   Continuous Infusions:  lactated ringers      vancomycin  1,750 mg (06/15/24 1059)   PRN Meds: acetaminophen  **OR** acetaminophen , chlorproMAZINE , ondansetron  **OR** ondansetron  (ZOFRAN ) IV, mouth rinse  Allergies:   No Known Allergies  Social History:   Social History   Socioeconomic History   Marital status: Married    Spouse name: Not on file   Number of children: Not on file   Years of education: Not on file   Highest education level: Bachelor's degree (e.g., BA, AB, BS)  Occupational History   Not on file  Tobacco Use   Smoking status: Every Day    Current packs/day: 0.50    Average packs/day: 0.5 packs/day for 50.6 years (25.3 ttl pk-yrs)    Types: Cigarettes    Start date: 10/30/1973   Smokeless tobacco: Never  Vaping Use   Vaping status: Never Used  Substance and Sexual Activity   Alcohol use: Not Currently    Comment: One drink every 6 months.   Drug use: No   Sexual activity: Yes  Other Topics Concern   Not on file  Social History Narrative   Not on file   Family History:   Family History  Problem Relation Age of Onset   Hypertension Mother    Diabetes Mother    Heart attack Mother    Hypertension Father    Heart attack Father    Heart attack Brother    Colon cancer Neg Hx      ROS:  Please see the history of present illness.  All other ROS reviewed and negative.     Physical Exam/Data: Vitals:   06/15/24 0424 06/15/24 1217 06/15/24 2010 06/16/24 0513  BP: (!) 161/97 134/85 132/89 126/74  Pulse: 99  (!) 102 90  Resp: 18 17 18 16   Temp: 98.1 F (36.7 C) 98.3 F (36.8 C) 98.8 F (37.1 C) 99.9 F (37.7 C)  TempSrc: Oral Oral Oral Oral  SpO2: 98% 99% 99% 95%  Weight:      Height:        Intake/Output Summary (Last 24 hours) at 06/16/2024 0900 Last data filed at 06/15/2024 1300 Gross per 24 hour  Intake 240 ml  Output --  Net 240 ml      06/13/2024    4:39  PM 06/09/2024    8:53 AM 05/18/2024    9:28 AM  Last 3 Weights  Weight (lbs) 190 lb 190 lb 8 oz 189 lb  Weight (kg) 86.183 kg 86.41 kg 85.73 kg     Body mass index is 26.5 kg/m.  General:  Well nourished, well developed, in no acute distress HEENT: normal Neck: no JVD Vascular: No carotid bruits; Distal pulses 2+ bilaterally Cardiac:  normal S1, S2; RRR; no murmur  Lungs:  clear to auscultation bilaterally, no wheezing, rhonchi or rales  Abd: soft, nontender, no hepatomegaly  Ext: no edema Musculoskeletal:  No deformities, BUE and BLE strength normal and equal Skin: warm and dry  Neuro:  CNs 2-12 intact, no focal abnormalities noted Psych:  Normal affect   EKG:  The EKG was personally reviewed and demonstrates:  Sinus tachycardia, HR 129 nonspecific T wave abnormalities  Telemetry:  Telemetry was personally reviewed and demonstrates: Sinus tachycardia, HR 90s to 120s with most recently 90-100  Relevant CV Studies:   ECHO 06/2024 IMPRESSIONS   1. Left ventricular ejection fraction, by estimation, is 55 to 60%. The  left ventricle has normal function. The left ventricle has no regional  wall motion abnormalities. Left ventricular diastolic parameters are  indeterminate.   2. Right ventricular systolic function is normal. The right ventricular  size is normal. Tricuspid regurgitation signal is inadequate for assessing  PA pressure.   3. Moderate pericardial effusion. The pericardial effusion is  circumferential. No diagnostic respiratory variation in mitral or  tricuspid inflow to suggest tamponade. There is mild, early diastolic  undulation in the RA and RV free wall - consider  followup imaging to reassess effusion size, particularly if clinical  status worsens.   4. The mitral valve is grossly normal. Trivial mitral valve  regurgitation.   5. The aortic valve is tricuspid. There is mild calcification of the  aortic valve. Aortic valve regurgitation is trivial.   6. The  inferior vena cava is normal in size with greater than 50%  respiratory variability, suggesting right atrial pressure of 3 mmHg.   Comparison(s): Prior images unable to be directly viewed.   Laboratory Data: High Sensitivity Troponin:   Recent Labs  Lab 06/13/24 1641 06/13/24 1747  TROPONINIHS 7 6     Chemistry Recent Labs  Lab 06/14/24 0359 06/15/24 0421 06/16/24 0438  NA 135 137 137  K 3.9 3.6 3.8  CL 105 107 107  CO2 22 23 22   GLUCOSE 117* 142* 94  BUN 12 16 14   CREATININE 1.07 1.05 1.05  CALCIUM  8.4* 9.2 8.4*  MG 1.9 2.0 1.9  GFRNONAA >60 >60 >60  ANIONGAP 8 7 8     Recent Labs  Lab 06/13/24 1700 06/14/24 0359 06/16/24 0555  PROT 6.6 5.9* 5.9*  ALBUMIN 3.1* 2.5* 2.7*  AST 26 22 41  ALT 23 18 27   ALKPHOS 67 56 47  BILITOT 1.2 1.2 0.4   Lipids No results for input(s): CHOL, TRIG, HDL, LABVLDL, LDLCALC, CHOLHDL in the last 168 hours.  Hematology Recent Labs  Lab 06/13/24 1641 06/14/24 0359 06/15/24 0421 06/16/24 0438  WBC 11.4* 10.8* 8.7 4.0  RBC 4.20* 3.59* 3.77* 3.36*  HGB 10.6* 9.3* 9.5* 8.6*  HCT 35.8* 30.8* 32.0* 29.4*  MCV 85.2 85.8 84.9 87.5  MCH 25.2* 25.9*  --  25.6*  MCHC 29.6* 30.2 29.7* 29.3*  RDW 20.3* 19.9* 19.9* 20.0*  PLT 320 269 PLATELET CLUMPS NOTED ON SMEAR, COUNT APPEARS ADEQUATE 188   Thyroid   Recent Labs  Lab 06/14/24 0901  TSH 0.106*    BNPNo results for input(s): BNP, PROBNP  in the last 168 hours.  DDimer  Recent Labs  Lab 06/13/24 1747  DDIMER 1.67*    Radiology/Studies:  CT ABDOMEN PELVIS W CONTRAST Result Date: 06/16/2024 EXAM: CT ABDOMEN AND PELVIS WITH CONTRAST 06/16/2024 12:44:30 AM TECHNIQUE: CT of the abdomen and pelvis was performed with the administration of intravenous contrast. Multiplanar reformatted images are provided for review. Automated exposure control, iterative reconstruction, and/or weight-based adjustment of the mA/kV was utilized to reduce the radiation dose to as low as  reasonably achievable. COMPARISON: CT 04/20/2024 CLINICAL HISTORY: Abdominal pain, acute, nonlocalized. Sudden onset mid abd pain and vomiting. FINDINGS: LOWER CHEST: No acute abnormality. LIVER: Hepatic steatosis. GALLBLADDER AND BILE DUCTS: Gallbladder is unremarkable. No biliary ductal dilatation. SPLEEN: No acute abnormality. PANCREAS: No acute abnormality. ADRENAL GLANDS: No acute abnormality. KIDNEYS, URETERS AND BLADDER: Nonobstructing left nephrolithiasis. No hydronephrosis. No perinephric or periureteral stranding. Urinary bladder is unremarkable. GI AND BOWEL: Moderate colonic stool burden. Normal appendix. Mild infiltration of the fat adjacent to the distal duodenum and proximal jejunum near the ligament of treitz. This could be due to mild enteritis. PERITONEUM AND RETROPERITONEUM: No ascites. No free air. VASCULATURE: Aortic atherosclerotic calcification. LYMPH NODES: No lymphadenopathy. REPRODUCTIVE ORGANS: No acute abnormality. BONES AND SOFT TISSUES: No acute osseous abnormality. No focal soft tissue abnormality. IMPRESSION: 1. Mild infiltration of the fat adjacent to the distal duodenum and proximal jejunum near the ligament of Treitz, possibly due to mild enteritis. 2. Hepatic steatosis. 3. Nonobstructing left nephrolithiasis without hydronephrosis. 4. Moderate colonic stool burden. Electronically signed by: Norman Gatlin MD 06/16/2024 12:59 AM EDT RP Workstation: [id number]   CT Angio Chest PE W/Cm &/Or Wo Cm Result Date: 06/13/2024 CLINICAL DATA:  Weakness and chest tightness. EXAM: CT ANGIOGRAPHY CHEST WITH CONTRAST TECHNIQUE: Multidetector CT imaging of the chest was performed using the standard protocol during bolus administration of intravenous contrast. Multiplanar CT image reconstructions and MIPs were obtained to evaluate the vascular anatomy. RADIATION DOSE REDUCTION: This exam was performed according to the departmental dose-optimization program which includes automated exposure  control, adjustment of the mA and/or kV according to patient size and/or use of iterative reconstruction technique. CONTRAST:  75mL OMNIPAQUE  IOHEXOL  350 MG/ML SOLN COMPARISON:  None Available. FINDINGS: Cardiovascular: There is moderate to marked severity calcification of the aortic arch, without evidence of aortic aneurysm. Satisfactory opacification of the pulmonary arteries to the segmental level. No evidence of pulmonary embolism. Normal heart size with mild coronary artery calcification. There is a small pericardial effusion which measures 1.3 cm in maximum thickness and is increased in size when compared to the prior study. Mediastinum/Nodes: No enlarged mediastinal, hilar, or axillary lymph nodes. Thyroid  gland, trachea, and esophagus demonstrate no significant findings. Lungs/Pleura: Stable, ill-defined 5 mm and 7 mm nodular appearing areas are seen along the periphery of the lateral and posterolateral right upper lobe (axial CT image 50, CT series 6). An additional stable 4 mm lateral right upper lobe pulmonary nodule is seen (axial CT image 44, CT series 6). A small, slightly patchy, nodular appearing area is seen within the right upper lobe (axial CT images 61 through 67, CT series 6). This was described as a 7 mm irregular nodularity on the prior study, and is less distinct on the current exam (measures approximately 1.7 cm). A stable 3 mm anteromedial left upper lobe pulmonary nodule is seen (axial CT image 61, CT series 6). A stable, 5 mm ground-glass pulmonary nodule is seen within the posterolateral aspect of the left upper lobe (axial CT  image 74, CT series 6). A trace amount of pleural fluid is present, bilaterally. No pneumothorax is identified. Upper Abdomen: There is diffuse fatty infiltration of the liver parenchyma. An 8 mm nonobstructing left renal calculus is present. Musculoskeletal: No chest wall abnormality. No acute or significant osseous findings. Review of the MIP images confirms the  above findings. IMPRESSION: 1. No evidence of pulmonary embolism. 2. Small pericardial effusion, increased in size when compared to the prior study. 3. Multiple bilateral pulmonary nodules, as described above, with a small, ill-defined nodular appearing area within the right upper lobe that is mildly increased in size when compared to the prior study. Further evaluation is recommended to exclude focal progression of neoplastic disease. 4. Trace amount of bilateral pleural fluid. 5. Hepatic steatosis. 6. 8 mm nonobstructing left renal calculus. 7. Aortic atherosclerosis. Electronically Signed   By: Suzen Dials M.D.   On: 06/13/2024 19:05   DG Abdomen 1 View Result Date: 06/13/2024 CLINICAL DATA:  Constipation.  Chest tightness. EXAM: ABDOMEN - 1 VIEW COMPARISON:  None Available. FINDINGS: There is a non obstructive bowel gas pattern. No supine evidence of free air. No organomegaly or suspicious calcification. No acute bony abnormality. Moderate stool burden. IMPRESSION: Moderate stool burden.  No acute findings. Electronically Signed   By: Franky Crease M.D.   On: 06/13/2024 17:41   DG Chest 2 View Result Date: 06/13/2024 CLINICAL DATA:  Chest pain EXAM: CHEST - 2 VIEW COMPARISON:  02/29/2024 FINDINGS: Heart and mediastinal contours are within normal limits. No focal opacities or effusions. No acute bony abnormality. IMPRESSION: No active cardiopulmonary disease. Electronically Signed   By: Franky Crease M.D.   On: 06/13/2024 17:41     Assessment and Plan: Pericardial Effusion  CTA with small pericardial effusion that increased in size since 11/11/2023.  ECHO showed EF 55 to 60%, moderate pericardial effusion that is circumferential. Repeat ECHO pending.  Present with chest tightness without typical position changes symptoms. Currently, no CP or SOB.   No signs of cardiac tamponade.  EKG w/o electrical alternans.  Discussed red flag symptoms for ED precautions including severe shortness of breath,  syncope, hypotension. Ordered ESR and CRP to determine if pericarditis is contributing.  If positive then would consider treatment with NSAID plus colchicine. Based on echo results, additional recommendations can be made. Will plan on repeat echo in outpatient in 2-4 weeks.  Will discuss with MD Suspect most likely secondary to malignancy and radiation.  Hypokalemia  K 3 now 3.8 & MG 1.9 with KCL supplement Continue to monitor  HTN  BP this am well-controlled 126/74 D/C Losartan  75 mg on 9/7 due to hypotension  HLD  Continue Crestor  20 mg   Risk Assessment/Risk Scores:  [place]  Heart Association (NYHA) Functional Class NYHA Class II     For questions or updates, please contact [facility] Please consult [email or web address] for contact info under    Signed, Lorette CINDERELLA Kapur, PA-C  06/16/2024 9:00 AM

## 2024-06-16 NOTE — Progress Notes (Addendum)
 Pt stated he was having hiccups, and called out asking for something to help with hiccups. Thorazine  given at 2334. At 2345, pt appeared in more distress upon assessment with nausea, abdominal pain radiating from right lower abdomen, to the upper right and left quadrants, hiccups unresolved, and requesting for MD to come to bedside. 20G IV put in with Ultrasound to the left Antecubital with Zofran  given for nausea. Per orders, STAT CT of abdomen done. MD Manfred discussed with pt and wife at beside results of CT and per orders, Protonix  given at 0311. Pt at 0515 this morning stated he was feeling better, not experiencing or expressing signs of pain, discomfort or distress.

## 2024-06-16 NOTE — Plan of Care (Signed)
   Problem: Activity: Goal: Risk for activity intolerance will decrease Outcome: Progressing   Problem: Nutrition: Goal: Adequate nutrition will be maintained Outcome: Progressing   Problem: Coping: Goal: Level of anxiety will decrease Outcome: Progressing

## 2024-06-16 NOTE — Progress Notes (Signed)
 Patient d/c by Swot RN Warren.  Jessieca Rhem, Cena Helling, RN

## 2024-06-16 NOTE — Progress Notes (Signed)
 Pharmacy Antibiotic Note  Bradley Goodman is a 68 y.o. male admitted on 06/13/2024 with sepsis.  Pharmacy has been consulted for Zosyn  and Vancomycin  dosing.  Plan: Vancomycin  1750 mg IV Q 24 hrs. Goal AUC 400-550. Expected AUC: 470.2 F/U antibiotic plan    Height: 5' 11 (180.3 cm) Weight: 86.2 kg (190 lb) IBW/kg (Calculated) : 75.3  Temp (24hrs), Avg:99 F (37.2 C), Min:98.4 F (36.9 C), Max:99.9 F (37.7 C)  Recent Labs  Lab 06/13/24 1641 06/13/24 1747 06/14/24 0359 06/15/24 0421 06/16/24 0438  WBC 11.4*  --  10.8* 8.7 4.0  CREATININE 1.21  --  1.07 1.05 1.05  LATICACIDVEN  --  1.3  --   --   --     Estimated Creatinine Clearance: 72.7 mL/min (by C-G formula based on SCr of 1.05 mg/dL).    No Known Allergies  Antimicrobials this admission: Zosyn  9/6 >> 9/8 Vancomycin  9/6 >>   Dose adjustments this admission:  Microbiology results:   Thank you for allowing pharmacy to be a part of this patient's care.  Elspeth Sour, PharmD Clinical Pharmacist 06/16/2024 3:42 PM

## 2024-06-16 NOTE — Plan of Care (Signed)
   Problem: Education: Goal: Knowledge of General Education information will improve Description Including pain rating scale, medication(s)/side effects and non-pharmacologic comfort measures Outcome: Progressing

## 2024-06-16 NOTE — Discharge Summary (Signed)
 Physician Discharge Summary   Patient: Bradley Goodman MRN: [id number] DOB: 03/23/1956  Admit date:     06/13/2024  Discharge date: 06/16/24  Discharge Physician: Alm Chea Malan   PCP: [name], FNP   Recommendations at discharge:   Please follow up with primary care provider within 1-2 weeks  Please repeat BMP and CBC in one week    Hospital Course: 68 year old male with a history of stage IV NSCLC (dx 03/2021), adrenal insufficiency, hypothyroidism, depression, hyperlipidemia presenting with fevers, chills, generalized weakness and chest tightness for 1-2 days.  He has been feeling  tired since his last cycle of chemotherapy on 06/09/2024.  He denied any headache, neck pain, dizziness, visual disturbance. He has had a nonproductive cough and some chest congestion but denies any frank shortness of breath.  There is no hemoptysis.  He has had some chest tightness at rest.  It is improved in the morning of 06/14/2024.  He has not had any nausea, vomiting, diarrhea, abdominal pain.  He is still some abdominal bloating.  He is passing gas.  His last bowel movement was 2 days prior to admission.  There is no hematochezia or melena.  There is no dysuria or hematuria.  His oral intake has not been good for the past 2 days.  He endorses compliance with all his medications including his dexamethasone .  He has been on dexamethasone  1 mg daily for the last 2 years without any changes in his dosing.  He denies any rashes or synovitis.  He did have a low-grade temperature of 99.5 F at home.  In the ED, the patient had a low-grade temperature 99.2.   Oxygen saturation is 97% room air. He was hemodynamically stable but tachycardic in the 120s. WBC 11.4, hemoglobin 1.6, platelets 320. Sodium 137, potassium 3.3, bicarbonate 24, serum creatinine 1.21.  LFTs were unremarkable.  CTA chest was negative for PE.  Showed small pericardial effusion with slight increase in size compared to prior.  There is multiple  bilateral pulmonary nodules.  There was an ill-defined nodular appearance to the area in the right upper lobe.  There is trace bilateral fluid.  The patient was started on Zosyn  and IV fluids.  Regarding his oncologic history, the patient has been following Dr. Gatha.  He was diagnosed with NSCLC June 2022 when he presented with right upper lobe lung nodule in addition to right hilar, subcarinal and bilateral mediastinal as well as supraclavicular lymphadenopathy in addition to bone and brain metastasis   The patient underwent SRS to metastatic brain lesion under the care of Dr. Patrcia and he is currently undergoing systemic chemotherapy with carboplatin  for AUC of 5, Alimta  500 Mg/M2 and Keytruda  200 Mg IV every 3 weeks status post 51 cycles  He is currently Currently on cycle 53 of treatment, last treatment 06/09/2024.  The patient was started on IV fluids and empiric antibiotics.  His electrolytes were optimized.  The patient improved gradually with conservative measures. On the evening of 06/15/2024, the patient developed acute onset of upper abdominal pain and right sided abdominal pain which was preceded by some hiccups.  There was no emesis.  The patient was treated with antiemetics and Thorazine .  CT abdomen and pelvis showed fatty infiltration of the distal duodenum and proximal jejunum.  There is no bowel obstruction or dilated loops of bowel.  Gallbladder was normal.  He improved with antiemetics and Thorazine .  LFTs and lipase were unremarkable.  He continued to tolerate his diet the following  day without difficulty or recurrence of his abdominal pain.  Assessment and Plan: Sepsis - Patient was tachycardic and tachypnea with leukocytosis. 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  1 mg daily -Temporarily increased dexamethasone  dosing during hospitalization -continue levothyroxine    Pericardial effusion -CTA noted slight increase compared to prior -9/7 Limited echo EF 55-60%, no WMA, mod pericardial effusion, no tamponade -9/9 limited echo EF 60-65%, mod pericardial effusion, no tamponade; effusion is smaller -CRP 7.0, ESR 74 -appreciate cardiology consult>>ok to d/c>>repeat CRP and ESR in one month, if still elevated then start colchicine/NSAIDS   Essential hypertension -Holding losartan  -BP remains well controlled -follow up PCP for BP check   Mixed hyperlipidemia -Continue statin   Stage 4 NSCLC - Last chemotherapeutic treatment 06/09/2024 -Follow-up Dr. Gatha   Depression -Continue Effexor    Steroid-induced diabetes -9/7 hemoglobin A1c 6.8 -NovoLog  sliding scale   Hypothyroidism -Continue synthroid       Consultants: cardiology Procedures performed: home  Disposition: Home Diet recommendation:  Carb modified diet DISCHARGE MEDICATION: Allergies as of 06/16/2024   No Known Allergies      Medication List     STOP taking these medications    losartan  50 MG tablet Commonly known as: COZAAR        TAKE these medications    acetaminophen  500 MG tablet Commonly known as: TYLENOL  Take 500 mg by mouth every 6 (six) hours as needed for mild pain (pain score 1-3) or fever.   B-D 3CC LUER-LOK SYR 21GX1-1/2 21G X 1-1/2 3 ML Misc Generic drug: SYRINGE-NEEDLE (DISP) 3 ML Use to inject testosterone  every week    chlorproMAZINE  10 MG tablet Commonly known as: THORAZINE  Take 1 tablet (10 mg total) by mouth 3 (three) times daily as needed for hiccoughs.   cholecalciferol  1000 units tablet Commonly known as: VITAMIN D  Take 1,000 Units by mouth every evening.   dexamethasone  1 MG tablet Commonly known as: DECADRON  Take 1 tablet (1 mg total) by mouth daily with breakfast.   famotidine  10 MG tablet Commonly known as: PEPCID  Take 10 mg by mouth at bedtime. Chewable   folic acid  1 MG tablet Commonly known as: FOLVITE  Take 1 tablet (1 mg total) by mouth daily. What changed: when to take this   FreeStyle Libre 3 Plus Sensor Misc Change sensor every 15 days.   Krill Oil 300 MG Caps Take 300 mg by mouth every evening.   levothyroxine  100 MCG tablet Commonly known as: SYNTHROID  Take 1 tablet (100 mcg total) by mouth daily before breakfast.   loratadine  10 MG tablet Commonly known as: CLARITIN  Take 10 mg by mouth every evening.   mirtazapine  15 MG tablet Commonly known as: REMERON  Take 1 tablet (15 mg total) by mouth at bedtime.   ondansetron  4 MG tablet Commonly known as: ZOFRAN  Take 1 tablet (4 mg total) by mouth every 6 (six) hours as needed for nausea.   One A Day Men 50 Plus Tabs Take 1 tablet by mouth at bedtime.   pantoprazole   40 MG tablet Commonly known as: PROTONIX  Take 1 tablet (40 mg total) by mouth every evening.   polyethylene glycol powder 17 GM/SCOOP powder Commonly known as: GLYCOLAX /MIRALAX  Take 119 g by mouth daily as needed for mild constipation or moderate constipation.   rosuvastatin  20 MG tablet Commonly known as: CRESTOR  Take 1 tablet (20 mg total) by mouth daily. What changed: when to take this   testosterone  cypionate 100 MG/ML injection Commonly known as: DEPOTESTOTERONE CYPIONATE Take 50mg  ( 0.5ml) and 100mg  ( 1ml) into muscle alternatively weekly.   venlafaxine  XR 75 MG 24 hr capsule Commonly known as: EFFEXOR -XR Take 2 capsules (150 mg total)  by mouth daily with breakfast. What changed: when to take this        Discharge Exam: Filed Weights   06/13/24 1639  Weight: 86.2 kg   HEENT:  [place]/AT, No thrush, no icterus CV:  RRR, no rub, no S3, no S4 Lung:  CTA, no wheeze, no rhonchi Abd:  soft/+BS, NT Ext:  trace LE edema, no lymphangitis, no synovitis, no rash   Condition at discharge: stable  The results of significant diagnostics from this hospitalization (including imaging, microbiology, ancillary and laboratory) are listed below for reference.   Imaging Studies: ECHOCARDIOGRAM LIMITED Result Date: 06/16/2024    ECHOCARDIOGRAM LIMITED REPORT   Patient Name:   TAMARION HAYMOND Date of Exam: 06/16/2024 Medical Rec #:  [id number]       Height:       71.0 in Accession #:    [id number]      Weight:       190.0 lb Date of Birth:  January 12, 1956        BSA:          2.063 m Patient Age:    68 years        BP:           131/56 mmHg Patient Gender: M               HR:           100 bpm. Exam Location:  [facility] Procedure: Limited Echo and Cardiac Doppler Indications:    Pericardial effusion I31.3  History:        Patient has prior history of Echocardiogram examinations, most                 recent 06/14/2024. Risk Factors:Hypertension, Diabetes and                 Dyslipidemia.  Sonographer:    Aida Pizza RCS Referring Phys: [phone] Jaely Silman IMPRESSIONS  1. Limited Echocardiogram to evaluate for pericardial effusion.  2. Moderate pericardial effusion posterior to the left ventricle, left atrium and localized near the right atrium. No evidence of cardiac tamponade. Pericardial effusion anterior to the right ventricle is completely resolved compared to prior study. Prior echo images reviewed that showed moderate circumferential pericardial effusion.  3. Left ventricular ejection fraction, by estimation, is 60 to 65%. The left ventricle has normal function. The left ventricle has no regional wall motion abnormalities. There is mild left ventricular  hypertrophy. Left ventricular diastolic function could not be evaluated.  4. Right ventricular systolic function is normal. The right ventricular size is normal. Tricuspid regurgitation signal is inadequate for assessing PA pressure.  5. The inferior vena cava is dilated in size with >50% respiratory variability, suggesting right atrial pressure of 8 mmHg. Comparison(s): Changes from prior study are noted. As above. FINDINGS  Left Ventricle:  Left ventricular ejection fraction, by estimation, is 60 to 65%. The left ventricle has normal function. The left ventricle has no regional wall motion abnormalities. The left ventricular internal cavity size was normal in size. There is  mild left ventricular hypertrophy. Left ventricular diastolic function could not be evaluated. Right Ventricle: The right ventricular size is normal. No increase in right ventricular wall thickness. Right ventricular systolic function is normal. Tricuspid regurgitation signal is inadequate for assessing PA pressure. Left Atrium: Left atrial size was not assessed. Right Atrium: Right atrial size was not assessed. Pericardium: A moderately sized pericardial effusion is present. The pericardial effusion is posterior to the left ventricle and the left atrium and localized near the right atrium. Mitral Valve: The mitral valve is normal in structure. Tricuspid Valve: The tricuspid valve is normal in structure. Aortic Valve: The aortic valve was not assessed. Pulmonic Valve: The pulmonic valve was not assessed. Aorta: The aortic root is normal in size and structure. Venous: The inferior vena cava is dilated in size with greater than 50% respiratory variability, suggesting right atrial pressure of 8 mmHg. IAS/Shunts: No atrial level shunt detected by color flow Doppler. LEFT VENTRICLE PLAX 2D LVIDd:         4.30 cm LVIDs:         3.00 cm LV PW:         1.20 cm LV IVS:        1.20 cm LVOT diam:     2.00 cm LVOT Area:     3.14 cm  RIGHT VENTRICLE TAPSE  (M-mode): 2.3 cm LEFT ATRIUM         Index LA diam:    3.40 cm 1.65 cm/m   AORTA Ao Root diam: 3.60 cm  SHUNTS Systemic Diam: 2.00 cm Vishnu Priya Mallipeddi Electronically signed by Diannah Late Mallipeddi Signature Date/Time: 06/16/2024/4:12:21 PM    Final    CT ABDOMEN PELVIS W CONTRAST Result Date: 06/16/2024 EXAM: CT ABDOMEN AND PELVIS WITH CONTRAST 06/16/2024 12:44:30 AM TECHNIQUE: CT of the abdomen and pelvis was performed with the administration of intravenous contrast. Multiplanar reformatted images are provided for review. Automated exposure control, iterative reconstruction, and/or weight-based adjustment of the mA/kV was utilized to reduce the radiation dose to as low as reasonably achievable. COMPARISON: CT 04/20/2024 CLINICAL HISTORY: Abdominal pain, acute, nonlocalized. Sudden onset mid abd pain and vomiting. FINDINGS: LOWER CHEST: No acute abnormality. LIVER: Hepatic steatosis. GALLBLADDER AND BILE DUCTS: Gallbladder is unremarkable. No biliary ductal dilatation. SPLEEN: No acute abnormality. PANCREAS: No acute abnormality. ADRENAL GLANDS: No acute abnormality. KIDNEYS, URETERS AND BLADDER: Nonobstructing left nephrolithiasis. No hydronephrosis. No perinephric or periureteral stranding. Urinary bladder is unremarkable. GI AND BOWEL: Moderate colonic stool burden. Normal appendix. Mild infiltration of the fat adjacent to the distal duodenum and proximal jejunum near the ligament of treitz. This could be due to mild enteritis. PERITONEUM AND RETROPERITONEUM: No ascites. No free air. VASCULATURE: Aortic atherosclerotic calcification. LYMPH NODES: No lymphadenopathy. REPRODUCTIVE ORGANS: No acute abnormality. BONES AND SOFT TISSUES: No acute osseous abnormality. No focal soft tissue abnormality. IMPRESSION: 1. Mild infiltration of the fat adjacent to the distal duodenum and proximal jejunum near the ligament of Treitz, possibly due to mild enteritis. 2. Hepatic steatosis. 3. Nonobstructing left  nephrolithiasis without hydronephrosis. 4. Moderate colonic stool burden. Electronically signed by: Norman Gatlin MD 06/16/2024 12:59 AM EDT RP Workstation: [id number]   ECHOCARDIOGRAM COMPLETE Result Date: 06/14/2024    ECHOCARDIOGRAM REPORT   Patient Name:   TUSHAR ENNS Date  of Exam: 06/14/2024 Medical Rec #:  [id number]       Height:       71.0 in Accession #:    [id number]      Weight:       190.0 lb Date of Birth:  [birth year]        BSA:          2.063 m Patient Age:    68 years        BP:           114/79 mmHg Patient Gender: M               HR:           103 bpm. Exam Location:  [facility] Procedure: 2D Echo, Color Doppler and Cardiac Doppler (Both Spectral and Color            Flow Doppler were utilized during procedure). Indications:    Percardial Effusion I31.3  History:        Patient has prior history of Echocardiogram examinations, most                 recent 10/13/2016. Risk Factors:Diabetes, Dyslipidemia,                 Hypertension and Former Smoker.  Sonographer:    Koleen Popper RDCS Referring Phys: [phone] Tal Neer IMPRESSIONS  1. Left ventricular ejection fraction, by estimation, is 55 to 60%. The left ventricle has normal function. The left ventricle has no regional wall motion abnormalities. Left ventricular diastolic parameters are indeterminate.  2. Right ventricular systolic function is normal. The right ventricular size is normal. Tricuspid regurgitation signal is inadequate for assessing PA pressure.  3. Moderate pericardial effusion. The pericardial effusion is circumferential. No diagnostic respiratory variation in mitral or tricuspid inflow to suggest tamponade. There is mild, early diastolic undulation in the RA and RV free wall - consider followup imaging to reassess effusion size, particularly if clinical status worsens.  4. The mitral valve is grossly normal. Trivial mitral valve regurgitation.  5. The aortic valve is tricuspid. There is mild calcification of the aortic valve.  Aortic valve regurgitation is trivial.  6. The inferior vena cava is normal in size with greater than 50% respiratory variability, suggesting right atrial pressure of 3 mmHg. Comparison(s): Prior images unable to be directly viewed. FINDINGS  Left Ventricle: Left ventricular ejection fraction, by estimation, is 55 to 60%. The left ventricle has normal function. The left ventricle has no regional wall motion abnormalities. The left ventricular internal cavity size was normal in size. There is  borderline left ventricular hypertrophy. Left ventricular diastolic parameters are indeterminate. Right Ventricle: The right ventricular size is normal. No increase in right ventricular wall thickness. Right ventricular systolic function is normal. Tricuspid regurgitation signal is inadequate for assessing PA pressure. Left Atrium: Left atrial size was normal in size. Right Atrium: Right atrial size was normal in size. Pericardium: A moderately sized pericardial effusion is present. The pericardial effusion is circumferential. Mitral Valve: The mitral valve is grossly normal. Trivial mitral valve regurgitation. Tricuspid Valve: The tricuspid valve is grossly normal. Tricuspid valve regurgitation is trivial. Aortic Valve: The aortic valve is tricuspid. There is mild calcification of the aortic valve. Aortic valve regurgitation is trivial. Aortic valve peak gradient measures 12.7 mmHg. Pulmonic Valve: The pulmonic valve was not well visualized. Pulmonic valve regurgitation is trivial. Aorta: The aortic root and ascending aorta are structurally normal, with no evidence of dilitation. Venous:  The inferior vena cava is normal in size with greater than 50% respiratory variability, suggesting right atrial pressure of 3 mmHg. IAS/Shunts: No atrial level shunt detected by color flow Doppler. Additional Comments: 3D was performed not requiring image post processing on an independent workstation and was indeterminate.  LEFT VENTRICLE PLAX  2D LVIDd:         4.40 cm   Diastology LVIDs:         3.20 cm   LV e' medial:    5.33 cm/s LV PW:         1.00 cm   LV E/e' medial:  17.3 LV IVS:        1.20 cm   LV e' lateral:   5.87 cm/s LVOT diam:     2.20 cm   LV E/e' lateral: 15.7 LV SV:         70 LV SV Index:   34 LVOT Area:     3.80 cm  RIGHT VENTRICLE             IVC RV Basal diam:  3.80 cm     IVC diam: 1.80 cm RV S prime:     15.00 cm/s TAPSE (M-mode): 2.1 cm LEFT ATRIUM             Index        RIGHT ATRIUM           Index LA diam:        3.50 cm 1.70 cm/m   RA Area:     14.30 cm LA Vol (A2C):   37.8 ml 18.32 ml/m  RA Volume:   32.70 ml  15.85 ml/m LA Vol (A4C):   37.8 ml 18.32 ml/m LA Biplane Vol: 38.2 ml 18.52 ml/m  AORTIC VALVE AV Area (Vmax): 2.37 cm AV Vmax:        178.00 cm/s AV Peak Grad:   12.7 mmHg LVOT Vmax:      111.00 cm/s LVOT Vmean:     73.900 cm/s LVOT VTI:       0.183 m  AORTA Ao Root diam: 3.20 cm Ao Asc diam:  3.60 cm MITRAL VALVE MV Area (PHT): 5.84 cm     SHUNTS MV Decel Time: 130 msec     Systemic VTI:  0.18 m MV E velocity: 92.00 cm/s   Systemic Diam: 2.20 cm MV A velocity: 134.00 cm/s MV E/A ratio:  0.69 Jayson Sierras MD Electronically signed by Jayson Sierras MD Signature Date/Time: 06/14/2024/11:13:48 AM    Final    CT Angio Chest PE W/Cm &/Or Wo Cm Result Date: 06/13/2024 CLINICAL DATA:  Weakness and chest tightness. EXAM: CT ANGIOGRAPHY CHEST WITH CONTRAST TECHNIQUE: Multidetector CT imaging of the chest was performed using the standard protocol during bolus administration of intravenous contrast. Multiplanar CT image reconstructions and MIPs were obtained to evaluate the vascular anatomy. RADIATION DOSE REDUCTION: This exam was performed according to the departmental dose-optimization program which includes automated exposure control, adjustment of the mA and/or kV according to patient size and/or use of iterative reconstruction technique. CONTRAST:  75mL OMNIPAQUE  IOHEXOL  350 MG/ML SOLN COMPARISON:  None  Available. FINDINGS: Cardiovascular: There is moderate to marked severity calcification of the aortic arch, without evidence of aortic aneurysm. Satisfactory opacification of the pulmonary arteries to the segmental level. No evidence of pulmonary embolism. Normal heart size with mild coronary artery calcification. There is a small pericardial effusion which measures 1.3 cm in maximum thickness and is increased in size when compared  to the prior study. Mediastinum/Nodes: No enlarged mediastinal, hilar, or axillary lymph nodes. Thyroid  gland, trachea, and esophagus demonstrate no significant findings. Lungs/Pleura: Stable, ill-defined 5 mm and 7 mm nodular appearing areas are seen along the periphery of the lateral and posterolateral right upper lobe (axial CT image 50, CT series 6). An additional stable 4 mm lateral right upper lobe pulmonary nodule is seen (axial CT image 44, CT series 6). A small, slightly patchy, nodular appearing area is seen within the right upper lobe (axial CT images 61 through 67, CT series 6). This was described as a 7 mm irregular nodularity on the prior study, and is less distinct on the current exam (measures approximately 1.7 cm). A stable 3 mm anteromedial left upper lobe pulmonary nodule is seen (axial CT image 61, CT series 6). A stable, 5 mm ground-glass pulmonary nodule is seen within the posterolateral aspect of the left upper lobe (axial CT image 74, CT series 6). A trace amount of pleural fluid is present, bilaterally. No pneumothorax is identified. Upper Abdomen: There is diffuse fatty infiltration of the liver parenchyma. An 8 mm nonobstructing left renal calculus is present. Musculoskeletal: No chest wall abnormality. No acute or significant osseous findings. Review of the MIP images confirms the above findings. IMPRESSION: 1. No evidence of pulmonary embolism. 2. Small pericardial effusion, increased in size when compared to the prior study. 3. Multiple bilateral pulmonary  nodules, as described above, with a small, ill-defined nodular appearing area within the right upper lobe that is mildly increased in size when compared to the prior study. Further evaluation is recommended to exclude focal progression of neoplastic disease. 4. Trace amount of bilateral pleural fluid. 5. Hepatic steatosis. 6. 8 mm nonobstructing left renal calculus. 7. Aortic atherosclerosis. Electronically Signed   By: Suzen Dials M.D.   On: 06/13/2024 19:05   DG Abdomen 1 View Result Date: 06/13/2024 CLINICAL DATA:  Constipation.  Chest tightness. EXAM: ABDOMEN - 1 VIEW COMPARISON:  None Available. FINDINGS: There is a non obstructive bowel gas pattern. No supine evidence of free air. No organomegaly or suspicious calcification. No acute bony abnormality. Moderate stool burden. IMPRESSION: Moderate stool burden.  No acute findings. Electronically Signed   By: Franky Crease M.D.   On: 06/13/2024 17:41   DG Chest 2 View Result Date: 06/13/2024 CLINICAL DATA:  Chest pain EXAM: CHEST - 2 VIEW COMPARISON:  02/29/2024 FINDINGS: Heart and mediastinal contours are within normal limits. No focal opacities or effusions. No acute bony abnormality. IMPRESSION: No active cardiopulmonary disease. Electronically Signed   By: Franky Crease M.D.   On: 06/13/2024 17:41    Microbiology: Results for orders placed or performed during the hospital encounter of 06/13/24  Resp panel by RT-PCR (RSV, Flu A&B, Covid) Anterior Nasal Swab     Status: None   Collection Time: 06/13/24  4:53 PM   Specimen: Anterior Nasal Swab  Result Value Ref Range Status   SARS Coronavirus 2 by RT PCR NEGATIVE NEGATIVE Final    Comment: (NOTE) SARS-CoV-2 target nucleic acids are NOT DETECTED.  The SARS-CoV-2 RNA is generally detectable in upper respiratory specimens during the acute phase of infection. The lowest concentration of SARS-CoV-2 viral copies this assay can detect is 138 copies/mL. A negative result does not preclude  SARS-Cov-2 infection and should not be used as the sole basis for treatment or other patient management decisions. A negative result may occur with  improper specimen collection/handling, submission of specimen other than nasopharyngeal swab, presence  of viral mutation(s) within the areas targeted by this assay, and inadequate number of viral copies(<138 copies/mL). A negative result must be combined with clinical observations, patient history, and epidemiological information. The expected result is Negative.  Fact Sheet for Patients:  BloggerCourse.com  Fact Sheet for Healthcare Providers:  SeriousBroker.it  This test is no t yet approved or cleared by the [place]  FDA and  has been authorized for detection and/or diagnosis of SARS-CoV-2 by FDA under an Emergency Use Authorization (EUA). This EUA will remain  in effect (meaning this test can be used) for the duration of the COVID-19 declaration under Section 564(b)(1) of the Act, 21 U.S.C.section 360bbb-3(b)(1), unless the authorization is terminated  or revoked sooner.       Influenza A by PCR NEGATIVE NEGATIVE Final   Influenza B by PCR NEGATIVE NEGATIVE Final    Comment: (NOTE) The Xpert Xpress SARS-CoV-2/FLU/RSV plus assay is intended as an aid in the diagnosis of influenza from Nasopharyngeal swab specimens and should not be used as a sole basis for treatment. Nasal washings and aspirates are unacceptable for Xpert Xpress SARS-CoV-2/FLU/RSV testing.  Fact Sheet for Patients: BloggerCourse.com  Fact Sheet for Healthcare Providers: SeriousBroker.it  This test is not yet approved or cleared by the [place]  FDA and has been authorized for detection and/or diagnosis of SARS-CoV-2 by FDA under an Emergency Use Authorization (EUA). This EUA will remain in effect (meaning this test can be used) for the duration of  the COVID-19 declaration under Section 564(b)(1) of the Act, 21 U.S.C. section 360bbb-3(b)(1), unless the authorization is terminated or revoked.     Resp Syncytial Virus by PCR NEGATIVE NEGATIVE Final    Comment: (NOTE) Fact Sheet for Patients: BloggerCourse.com  Fact Sheet for Healthcare Providers: SeriousBroker.it  This test is not yet approved or cleared by the [place]  FDA and has been authorized for detection and/or diagnosis of SARS-CoV-2 by FDA under an Emergency Use Authorization (EUA). This EUA will remain in effect (meaning this test can be used) for the duration of the COVID-19 declaration under Section 564(b)(1) of the Act, 21 U.S.C. section 360bbb-3(b)(1), unless the authorization is terminated or revoked.  Performed at [facility], [address] [place], [place] [address]   Culture, blood (routine x 2)     Status: None (Preliminary result)   Collection Time: 06/13/24  5:47 PM   Specimen: BLOOD RIGHT ARM  Result Value Ref Range Status   Specimen Description   Final    BLOOD RIGHT ARM BOTTLES DRAWN AEROBIC AND ANAEROBIC   Special Requests Blood Culture adequate volume  Final   Culture   Final    NO GROWTH 3 DAYS Performed at [facility], [address] [place], [place] [address]    Report Status PENDING  Incomplete  Culture, blood (routine x 2)     Status: None (Preliminary result)   Collection Time: 06/13/24  5:55 PM   Specimen: Left Antecubital; Blood  Result Value Ref Range Status   Specimen Description   Final    LEFT ANTECUBITAL BOTTLES DRAWN AEROBIC AND ANAEROBIC   Special Requests   Final    Blood Culture results may not be optimal due to an inadequate volume of blood received in culture bottles   Culture   Final    NO GROWTH 3 DAYS Performed at [facility], [address] [place], [place] [address]    Report Status PENDING  Incomplete  Respiratory (~20 pathogens) panel by PCR  Status:  None   Collection Time: 06/14/24 10:00 AM   Specimen: Nasopharyngeal Swab; Respiratory  Result Value Ref Range Status   Adenovirus NOT DETECTED NOT DETECTED Final   Coronavirus 229E NOT DETECTED NOT DETECTED Final    Comment: (NOTE) The Coronavirus on the Respiratory Panel, DOES NOT test for the novel  Coronavirus (2019 nCoV)    Coronavirus HKU1 NOT DETECTED NOT DETECTED Final   Coronavirus NL63 NOT DETECTED NOT DETECTED Final   Coronavirus OC43 NOT DETECTED NOT DETECTED Final   Metapneumovirus NOT DETECTED NOT DETECTED Final   Rhinovirus / Enterovirus NOT DETECTED NOT DETECTED Final   Influenza A NOT DETECTED NOT DETECTED Final   Influenza B NOT DETECTED NOT DETECTED Final   Parainfluenza Virus 1 NOT DETECTED NOT DETECTED Final   Parainfluenza Virus 2 NOT DETECTED NOT DETECTED Final   Parainfluenza Virus 3 NOT DETECTED NOT DETECTED Final   Parainfluenza Virus 4 NOT DETECTED NOT DETECTED Final   Respiratory Syncytial Virus NOT DETECTED NOT DETECTED Final   Bordetella pertussis NOT DETECTED NOT DETECTED Final   Bordetella Parapertussis NOT DETECTED NOT DETECTED Final   Chlamydophila pneumoniae NOT DETECTED NOT DETECTED Final   Mycoplasma pneumoniae NOT DETECTED NOT DETECTED Final    Comment: Performed at [facility], [address], [place], [place] [address]  Urine Culture (for pregnant, neutropenic or urologic patients or patients with an indwelling urinary catheter)     Status: None   Collection Time: 06/14/24 10:00 AM   Specimen: Urine, Clean Catch  Result Value Ref Range Status   Specimen Description   Final    URINE, CLEAN CATCH Performed at [facility], [address] [place], [place] [address]    Special Requests   Final    NONE Performed at [facility], [address], [place], [place] [address]    Culture   Final    NO GROWTH Performed at [facility], [address] [place], [place] [address]    Report Status 06/15/2024 FINAL  Final   *Note: Due  to a large number of results and/or encounters for the requested time period, some results have not been displayed. A complete set of results can be found in Results Review.    Labs: CBC: Recent Labs  Lab 06/13/24 1641 06/14/24 0359 06/15/24 0421 06/16/24 0438  WBC 11.4* 10.8* 8.7 4.0  NEUTROABS  --   --  7.5  --   HGB 10.6* 9.3* 9.5* 8.6*  HCT 35.8* 30.8* 32.0* 29.4*  MCV 85.2 85.8 84.9 87.5  PLT 320 269 PLATELET CLUMPS NOTED ON SMEAR, COUNT APPEARS ADEQUATE 188   Basic Metabolic Panel: Recent Labs  Lab 06/13/24 1641 06/13/24 1747 06/14/24 0359 06/15/24 0421 06/16/24 0438  NA 137  --  135 137 137  K 3.3*  --  3.9 3.6 3.8  CL 101  --  105 107 107  CO2 24  --  22 23 22   GLUCOSE 126*  --  117* 142* 94  BUN 15  --  12 16 14   CREATININE 1.21  --  1.07 1.05 1.05  CALCIUM  9.0  --  8.4* 9.2 8.4*  MG  --  1.9 1.9 2.0 1.9  PHOS  --   --  3.0 2.6  --    Liver Function Tests: Recent Labs  Lab 06/13/24 1700 06/14/24 0359 06/16/24 0555  AST 26 22 41  ALT 23 18 27   ALKPHOS 67 56 47  BILITOT 1.2 1.2 0.4  PROT  6.6 5.9* 5.9*  ALBUMIN 3.1* 2.5* 2.7*   CBG: No results for input(s): GLUCAP in the last 168 hours.  Discharge time spent: greater than 30 minutes.  Signed: Alm Schneider, MD [facility] 06/16/2024

## 2024-06-17 ENCOUNTER — Encounter: Admitting: Family Medicine

## 2024-06-18 LAB — CULTURE, BLOOD (ROUTINE X 2)
Culture: NO GROWTH
Culture: NO GROWTH
Special Requests: ADEQUATE

## 2024-06-19 ENCOUNTER — Other Ambulatory Visit: Payer: Self-pay

## 2024-06-19 ENCOUNTER — Other Ambulatory Visit

## 2024-06-23 ENCOUNTER — Encounter: Payer: Self-pay | Admitting: Family Medicine

## 2024-06-23 ENCOUNTER — Ambulatory Visit: Admitting: Family Medicine

## 2024-06-23 VITALS — BP 122/88 | HR 97 | Temp 97.4°F | Ht 71.0 in | Wt 185.6 lb

## 2024-06-23 DIAGNOSIS — E1159 Type 2 diabetes mellitus with other circulatory complications: Secondary | ICD-10-CM | POA: Diagnosis not present

## 2024-06-23 DIAGNOSIS — E876 Hypokalemia: Secondary | ICD-10-CM | POA: Diagnosis not present

## 2024-06-23 DIAGNOSIS — E8809 Other disorders of plasma-protein metabolism, not elsewhere classified: Secondary | ICD-10-CM

## 2024-06-23 DIAGNOSIS — G3184 Mild cognitive impairment, so stated: Secondary | ICD-10-CM

## 2024-06-23 DIAGNOSIS — E46 Unspecified protein-calorie malnutrition: Secondary | ICD-10-CM | POA: Diagnosis not present

## 2024-06-23 DIAGNOSIS — I152 Hypertension secondary to endocrine disorders: Secondary | ICD-10-CM | POA: Diagnosis not present

## 2024-06-23 DIAGNOSIS — C3491 Malignant neoplasm of unspecified part of right bronchus or lung: Secondary | ICD-10-CM

## 2024-06-23 DIAGNOSIS — Z09 Encounter for follow-up examination after completed treatment for conditions other than malignant neoplasm: Secondary | ICD-10-CM | POA: Diagnosis not present

## 2024-06-23 DIAGNOSIS — A419 Sepsis, unspecified organism: Secondary | ICD-10-CM | POA: Diagnosis not present

## 2024-06-23 DIAGNOSIS — C7931 Secondary malignant neoplasm of brain: Secondary | ICD-10-CM | POA: Diagnosis not present

## 2024-06-23 DIAGNOSIS — Z0000 Encounter for general adult medical examination without abnormal findings: Secondary | ICD-10-CM

## 2024-06-23 NOTE — Progress Notes (Signed)
 Subjective:    Bradley Goodman is a 68 y.o. male who presents for a  Chief Complaint  Patient presents with   welcome to medicare   Hospitalization Follow-up    06/13/2024 - 06/16/2024 (3 days) [facility]- sepsis     He is a 68 year old male with a recent hospitalization for an infection who presents for follow-up.  He was hospitalized for three days due to an infection, suspected to have originated from his leg, though the exact source was not identified. During the hospitalization, he was treated with antibiotics including Doxycycline  and Keflex . Since discharge, he has completed a four-day course of antibiotics and feels better, though still recovering.  He has a history of cancer and is undergoing chemotherapy. He tends to get sick around the time of his chemotherapy sessions, although not every time. He has been hospitalized twice this year following chemotherapy sessions, once for blockages and once for an infection.  During his recent hospitalization, a pericardial effusion was discovered when he was being evaluated for a potential blockage. He is currently taking ibuprofen nightly for pericarditis related to the effusion.  He has a history of hypertension and was previously on losartan , which was discontinued. He monitors his blood pressure at home and has losartan  available if needed. He was taking 75 mg of losartan  prior to discontinuation.  He has a history of diabetes, which is managed alongside his other conditions. His renal function is monitored regularly, especially in the context of his blood pressure management.  He reports eating and drinking well since returning home from the hospital. He continues to work every weekend despite his medical conditions.      Cardiac Risk Factors include: advanced age (>88men, >56 women);diabetes mellitus;male gender;hypertension;smoking/ tobacco exposure     Objective:    Today's Vitals   06/23/24 1404 06/23/24 1405 06/23/24  1407  BP: (!) 141/93 122/88   Pulse: 97    Temp: (!) 97.4 F (36.3 C)    SpO2: 95%    Weight: 185 lb 9.6 oz (84.2 kg)    Height: 5' 11 (1.803 m)    PainSc:   0-No pain   Body mass index is 25.89 kg/m.  Medications Outpatient Encounter Medications as of 06/23/2024  Medication Sig   acetaminophen  (TYLENOL ) 500 MG tablet Take 500 mg by mouth every 6 (six) hours as needed for mild pain (pain score 1-3) or fever.   chlorproMAZINE  (THORAZINE ) 10 MG tablet Take 1 tablet (10 mg total) by mouth 3 (three) times daily as needed for hiccoughs.   cholecalciferol  (VITAMIN D ) 1000 UNITS tablet Take 1,000 Units by mouth every evening.   Continuous Glucose Sensor (FREESTYLE LIBRE 3 PLUS SENSOR) MISC Change sensor every 15 days.   dexamethasone  (DECADRON ) 1 MG tablet Take 1 tablet (1 mg total) by mouth daily with breakfast.   famotidine  (PEPCID ) 10 MG tablet Take 10 mg by mouth at bedtime. Chewable   folic acid  (FOLVITE ) 1 MG tablet Take 1 tablet (1 mg total) by mouth daily. (Patient taking differently: Take 1 mg by mouth at bedtime.)   Krill Oil 300 MG CAPS Take 300 mg by mouth every evening.   levothyroxine  (SYNTHROID ) 100 MCG tablet Take 1 tablet (100 mcg total) by mouth daily before breakfast.   loratadine  (CLARITIN ) 10 MG tablet Take 10 mg by mouth every evening.    mirtazapine  (REMERON ) 15 MG tablet Take 1 tablet (15 mg total) by mouth at bedtime.   Multiple Vitamins-Minerals (ONE  A DAY MEN 50 PLUS) TABS Take 1 tablet by mouth at bedtime.   ondansetron  (ZOFRAN ) 4 MG tablet Take 1 tablet (4 mg total) by mouth every 6 (six) hours as needed for nausea.   pantoprazole  (PROTONIX ) 40 MG tablet Take 1 tablet (40 mg total) by mouth every evening.   polyethylene glycol powder (GLYCOLAX /MIRALAX ) 17 GM/SCOOP powder Take 119 g by mouth daily as needed for mild constipation or moderate constipation.   rosuvastatin  (CRESTOR ) 20 MG tablet Take 1 tablet (20 mg total) by mouth daily. (Patient taking differently:  Take 20 mg by mouth at bedtime.)   SYRINGE-NEEDLE, DISP, 3 ML (B-D 3CC LUER-LOK SYR 21GX1-1/2) 21G X 1-1/2 3 ML MISC Use to inject testosterone  every week   testosterone  cypionate (DEPOTESTOTERONE CYPIONATE) 100 MG/ML injection Take 50mg  ( 0.4ml) and 100mg  ( 1ml) into muscle alternatively weekly.   venlafaxine  XR (EFFEXOR -XR) 75 MG 24 hr capsule Take 2 capsules (150 mg total) by mouth daily with breakfast. (Patient taking differently: Take 150 mg by mouth at bedtime.)   [DISCONTINUED] cephALEXin  (KEFLEX ) 500 MG capsule Take 1 capsule (500 mg total) by mouth every 8 (eight) hours.   [DISCONTINUED] doxycycline  (VIBRA -TABS) 100 MG tablet Take 1 tablet (100 mg total) by mouth every 12 (twelve) hours.   No facility-administered encounter medications on file as of 06/23/2024.     History: 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   PONV (postoperative nausea and vomiting)    Type 2 diabetes mellitus (HCC)    Past Surgical History:  Procedure Laterality Date   Bilateral inguinal hernia repair     BRONCHIAL NEEDLE ASPIRATION BIOPSY  04/05/2021   Procedure: BRONCHIAL NEEDLE ASPIRATION BIOPSIES;  Surgeon: Brenna Adine CROME, DO;  Location: [facility];  Service: Pulmonary;;   COLONOSCOPY  01/23/2012   Procedure: COLONOSCOPY;  Surgeon: Lamar CHRISTELLA Hollingshead, MD;  Location: [facility];  Service: Endoscopy;  Laterality: N/A;  9:30 AM   COLONOSCOPY N/A 10/11/2015   Procedure: COLONOSCOPY;  Surgeon: Lamar CHRISTELLA Hollingshead, MD;  Location: [facility];  Service: Endoscopy;  Laterality: N/A;  830   COLONOSCOPY N/A 10/14/2019   Procedure: COLONOSCOPY;  Surgeon: Hollingshead Lamar CHRISTELLA, MD;  Location: [facility];  Service: Endoscopy;  Laterality: N/A;  1:45   POLYPECTOMY  10/14/2019   Procedure: POLYPECTOMY;  Surgeon:  Hollingshead Lamar CHRISTELLA, MD;  Location: [facility];  Service: Endoscopy;;  ascending colon, descending colon   VIDEO BRONCHOSCOPY WITH ENDOBRONCHIAL ULTRASOUND N/A 04/05/2021   Procedure: VIDEO BRONCHOSCOPY WITH ENDOBRONCHIAL ULTRASOUND;  Surgeon: Brenna Adine CROME, DO;  Location: [facility];  Service: Pulmonary;  Laterality: N/A;    Family History  Problem Relation Age of Onset   Hypertension Mother    Diabetes Mother    Heart attack Mother    Hypertension Father    Heart attack Father    Heart attack Brother    Colon cancer Neg Hx    Social History   Occupational History   Not on file  Tobacco Use   Smoking status: Every Day    Current packs/day: 0.50    Average packs/day: 0.5 packs/day for 50.6 years (25.3 ttl pk-yrs)    Types: Cigarettes    Start date: 10/30/1973  Smokeless tobacco: Never  Vaping Use   Vaping status: Never Used  Substance and Sexual Activity   Alcohol use: Not Currently    Comment: One drink every 6 months.   Drug use: No   Sexual activity: Yes    Tobacco Counseling Ready to quit: Not Answered Counseling given: Not Answered   Immunizations and Health Maintenance Immunization History  Administered Date(s) Administered   Fluad Quad(high Dose 65+) 07/28/2022   Hepatitis B 02/06/1990, 10/28/1990, 04/02/1991   Influenza,inj,Quad PF,6+ Mos 07/07/2019, 07/31/2021   Influenza-Unspecified 07/25/2018   Moderna Sars-Covid-2 Vaccination 12/01/2019, 12/30/2019, 10/19/2020   Pneumococcal Conjugate-13 03/11/2017   Tdap 01/17/2016   There are no preventive care reminders to display for this patient.   Activities of Daily Living    06/23/2024    2:07 PM 06/23/2024   11:22 AM  In your present state of health, do you have any difficulty performing the following activities:  Hearing? 0 0  Vision? 0 0  Difficulty concentrating or making decisions? 0 0  Walking or climbing stairs? 0 0  Dressing or bathing? 0 0  Doing errands, shopping? 0 0  Preparing Food and  eating ? N N  Using the Toilet? N N  In the past six months, have you accidently leaked urine? N N  Do you have problems with loss of bowel control? N N  Managing your Medications? N N  Managing your Finances? N N  Housekeeping or managing your Housekeeping? N N     Physical Exam Vitals and nursing note reviewed.  Constitutional:      General: He is not in acute distress.    Appearance: Normal appearance. He is ill-appearing (chronically ill). He is not toxic-appearing or diaphoretic.  HENT:     Head: Normocephalic and atraumatic.     Mouth/Throat:     Mouth: Mucous membranes are moist.     Pharynx: Oropharynx is clear.  Eyes:     Conjunctiva/sclera: Conjunctivae normal.     Pupils: Pupils are equal, round, and reactive to light.  Cardiovascular:     Rate and Rhythm: Normal rate and regular rhythm.     Heart sounds: Normal heart sounds.     No friction rub.  Pulmonary:     Effort: Pulmonary effort is normal.     Breath sounds: Normal breath sounds.  Musculoskeletal:     Cervical back: Neck supple.     Right lower leg: Edema (trace) present.     Left lower leg: Edema (trace) present.  Skin:    General: Skin is warm and dry.     Capillary Refill: Capillary refill takes less than 2 seconds.  Neurological:     General: No focal deficit present.     Mental Status: He is alert and oriented to person, place, and time.  Psychiatric:        Mood and Affect: Mood normal.        Behavior: Behavior normal.        Thought Content: Thought content normal.        Judgment: Judgment normal.       Advanced Directives: Does Patient Have a Medical Advance Directive?: No Does patient want to make changes to medical advance directive?: Yes (MAU/Ambulatory/Procedural Areas - Information given) Would patient like information on creating a medical advance directive?: No - Patient declined       Assessment:    This is a routine wellness  examination for this patient .     Goals  CCM Expected Outcome:  Monitor, Self-Manage and Reduce Symptoms of Diabetes         Depression Screen    06/23/2024    2:15 PM 06/09/2024    9:00 AM 04/22/2024    8:50 AM 01/14/2024    8:39 AM  PHQ 2/9 Scores  PHQ - 2 Score 1 0 1 0  PHQ- 9 Score 2  2 1      Fall Risk    06/23/2024    2:15 PM  Fall Risk   Falls in the past year? 0  Number falls in past yr: 0  Injury with Fall? 0  Risk for fall due to : No Fall Risks  Follow up Falls evaluation completed    Cognitive Function    06/23/2024    2:09 PM  MMSE - Mini Mental State Exam  Orientation to time 3  Orientation to Place 5  Registration 3  Attention/ Calculation 5  Recall 2  Language- name 2 objects 2  Language- repeat 1  Language- follow 3 step command 3  Language- read & follow direction 1  Write a sentence 1  Copy design 1  Total score 27        Patient Care Team: [name], FNP as PCP - General (Family Medicine) Shaaron, Lamar HERO, MD as Consulting Physician (Gastroenterology) Vicci Mcardle, OD (Optometry)     Plan:  Brandi was seen today for welcome to medicare and hospitalization follow-up.  Diagnoses and all orders for this visit:  Encounter for Medicare annual wellness exam  Hospital discharge follow-up -     CMP14+EGFR -     CBC with Differential/Platelet  Sepsis, due to unspecified organism, unspecified whether acute organ dysfunction present (HCC) -     CMP14+EGFR -     CBC with Differential/Platelet  Hypokalemia -     CMP14+EGFR -     CBC with Differential/Platelet  Adenocarcinoma of right lung, stage 4 (HCC) -     CMP14+EGFR -     CBC with Differential/Platelet  Malignant neoplasm metastatic to brain (HCC) -     CMP14+EGFR -     CBC with Differential/Platelet  Hypoalbuminemia due to protein-calorie malnutrition (HCC) -     CMP14+EGFR -     CBC with Differential/Platelet  Hypertension associated with type 2 diabetes mellitus (HCC) -     CMP14+EGFR -     CBC with  Differential/Platelet      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  Sepsis, unspecified organism Recent hospitalization for sepsis of unknown origin, possibly related to leg infection. Completed course of Doxycycline  and Keflex  post-discharge.  Pericardial effusion and pericarditis Recent pericardial effusion discovered during hospitalization. Currently on ibuprofen for pericarditis, with one week remaining in the treatment course. - Continue ibuprofen for one more week.  Edema of legs and arms Edema significantly reduced following diuresis during hospitalization.  Hypertension Currently off medication. Blood pressure well-managed without medication. Monitoring at home with instructions to restart medication if blood pressure consistently exceeds 130/80 mmHg. - Monitor blood pressure at home. - Restart losartan  if blood pressure exceeds 130/80 mmHg consistently. - Consider taking half dose of losartan  if blood pressure control is needed.  Type 2 diabetes mellitus without complications Diabetes management ongoing.  Mild cognitive impairment Mild cognitive impairment noted, particularly with orientation to date. No specific interventions discussed.        I have personally reviewed and noted the following in  the patient's chart:   Medical and social history Use of alcohol, tobacco or illicit drugs  Current medications and supplements including opioid prescriptions. Patient is not currently taking opioid prescriptions. Functional ability and status Nutritional status Physical activity Advanced directives List of other physicians Hospitalizations, surgeries, and ER visits in previous 12 months Vitals Screenings to include cognitive, depression, and falls Referrals and appointments  In addition, I have reviewed and discussed with patient certain preventive protocols, quality  metrics, and best practice recommendations. A written personalized care plan for preventive services as well as general preventive health recommendations were provided to patient.     Return if symptoms worsen or fail to improve.  Rosaline Bruns, FNP-C [facility] [address] [place], [place] [address] [phone]

## 2024-06-25 NOTE — Addendum Note (Signed)
 Addended by: [name] on: 06/25/2024 07:46 AM   Modules accepted: Level of Service

## 2024-06-29 ENCOUNTER — Inpatient Hospital Stay

## 2024-06-29 ENCOUNTER — Inpatient Hospital Stay (HOSPITAL_BASED_OUTPATIENT_CLINIC_OR_DEPARTMENT_OTHER): Admitting: Internal Medicine

## 2024-06-29 VITALS — BP 151/93 | HR 91 | Temp 97.9°F | Resp 18

## 2024-06-29 VITALS — BP 142/86 | HR 109 | Temp 97.8°F | Resp 17 | Ht 71.0 in | Wt 189.9 lb

## 2024-06-29 DIAGNOSIS — C7931 Secondary malignant neoplasm of brain: Secondary | ICD-10-CM | POA: Diagnosis not present

## 2024-06-29 DIAGNOSIS — Z5112 Encounter for antineoplastic immunotherapy: Secondary | ICD-10-CM | POA: Diagnosis not present

## 2024-06-29 DIAGNOSIS — F17210 Nicotine dependence, cigarettes, uncomplicated: Secondary | ICD-10-CM | POA: Diagnosis not present

## 2024-06-29 DIAGNOSIS — E039 Hypothyroidism, unspecified: Secondary | ICD-10-CM | POA: Diagnosis not present

## 2024-06-29 DIAGNOSIS — E230 Hypopituitarism: Secondary | ICD-10-CM | POA: Diagnosis not present

## 2024-06-29 DIAGNOSIS — Z5111 Encounter for antineoplastic chemotherapy: Secondary | ICD-10-CM | POA: Diagnosis not present

## 2024-06-29 DIAGNOSIS — C3411 Malignant neoplasm of upper lobe, right bronchus or lung: Secondary | ICD-10-CM | POA: Diagnosis not present

## 2024-06-29 DIAGNOSIS — C3491 Malignant neoplasm of unspecified part of right bronchus or lung: Secondary | ICD-10-CM

## 2024-06-29 DIAGNOSIS — K5900 Constipation, unspecified: Secondary | ICD-10-CM | POA: Diagnosis not present

## 2024-06-29 DIAGNOSIS — E2740 Unspecified adrenocortical insufficiency: Secondary | ICD-10-CM | POA: Diagnosis not present

## 2024-06-29 DIAGNOSIS — C7951 Secondary malignant neoplasm of bone: Secondary | ICD-10-CM | POA: Diagnosis not present

## 2024-06-29 DIAGNOSIS — Z7952 Long term (current) use of systemic steroids: Secondary | ICD-10-CM | POA: Diagnosis not present

## 2024-06-29 LAB — CBC WITH DIFFERENTIAL (CANCER CENTER ONLY)
Abs Immature Granulocytes: 0.05 K/uL (ref 0.00–0.07)
Basophils Absolute: 0.1 K/uL (ref 0.0–0.1)
Basophils Relative: 1 %
Eosinophils Absolute: 0.1 K/uL (ref 0.0–0.5)
Eosinophils Relative: 1 %
HCT: 35.3 % — ABNORMAL LOW (ref 39.0–52.0)
Hemoglobin: 10.5 g/dL — ABNORMAL LOW (ref 13.0–17.0)
Immature Granulocytes: 1 %
Lymphocytes Relative: 16 %
Lymphs Abs: 1.6 K/uL (ref 0.7–4.0)
MCH: 25.1 pg — ABNORMAL LOW (ref 26.0–34.0)
MCHC: 29.7 g/dL — ABNORMAL LOW (ref 30.0–36.0)
MCV: 84.2 fL (ref 80.0–100.0)
Monocytes Absolute: 0.6 K/uL (ref 0.1–1.0)
Monocytes Relative: 6 %
Neutro Abs: 7.9 K/uL — ABNORMAL HIGH (ref 1.7–7.7)
Neutrophils Relative %: 75 %
Platelet Count: 436 K/uL — ABNORMAL HIGH (ref 150–400)
RBC: 4.19 MIL/uL — ABNORMAL LOW (ref 4.22–5.81)
RDW: 20.9 % — ABNORMAL HIGH (ref 11.5–15.5)
WBC Count: 10.4 K/uL (ref 4.0–10.5)
nRBC: 0 % (ref 0.0–0.2)

## 2024-06-29 LAB — CMP (CANCER CENTER ONLY)
ALT: 19 U/L (ref 0–44)
AST: 19 U/L (ref 15–41)
Albumin: 3.8 g/dL (ref 3.5–5.0)
Alkaline Phosphatase: 63 U/L (ref 38–126)
Anion gap: 7 (ref 5–15)
BUN: 17 mg/dL (ref 8–23)
CO2: 29 mmol/L (ref 22–32)
Calcium: 8.9 mg/dL (ref 8.9–10.3)
Chloride: 106 mmol/L (ref 98–111)
Creatinine: 0.96 mg/dL (ref 0.61–1.24)
GFR, Estimated: >60 mL/min (ref >60)
Glucose, Bld: 138 mg/dL — ABNORMAL HIGH (ref 70–99)
Potassium: 3.7 mmol/L (ref 3.5–5.1)
Sodium: 142 mmol/L (ref 135–145)
Total Bilirubin: 0.2 mg/dL (ref 0.0–1.2)
Total Protein: 6.8 g/dL (ref 6.5–8.1)

## 2024-06-29 MED ORDER — PROCHLORPERAZINE MALEATE 10 MG PO TABS
10.0000 mg | ORAL_TABLET | Freq: Once | ORAL | Status: AC
  Administered 2024-06-29: 10 mg via ORAL
  Filled 2024-06-29: qty 1

## 2024-06-29 MED ORDER — SODIUM CHLORIDE 0.9 % IV SOLN
500.0000 mg/m2 | Freq: Once | INTRAVENOUS | Status: AC
  Administered 2024-06-29: 1000 mg via INTRAVENOUS
  Filled 2024-06-29: qty 40

## 2024-06-29 MED ORDER — CYANOCOBALAMIN 1000 MCG/ML IJ SOLN
1000.0000 ug | Freq: Once | INTRAMUSCULAR | Status: AC
  Administered 2024-06-29: 1000 ug via INTRAMUSCULAR
  Filled 2024-06-29: qty 1

## 2024-06-29 MED ORDER — SODIUM CHLORIDE 0.9 % IV SOLN
200.0000 mg | Freq: Once | INTRAVENOUS | Status: AC
  Administered 2024-06-29: 200 mg via INTRAVENOUS
  Filled 2024-06-29: qty 200

## 2024-06-29 MED ORDER — SODIUM CHLORIDE 0.9 % IV SOLN: Freq: Once | INTRAVENOUS | Status: AC

## 2024-06-29 NOTE — Progress Notes (Signed)
 [facility] [phone]   [phone]  OFFICE PROGRESS NOTE  Bradley [name], FNP [address] [place] [address]  DIAGNOSIS: Stage IV (T1b, N3, M1C) non-small cell lung cancer, favoring adenocarcinoma presented with right upper lobe lung nodule in addition to right hilar, subcarinal and bilateral mediastinal as well as supraclavicular lymphadenopathy in addition to bone and brain metastasis diagnosed in June 2022.     PD-L1 expression 80%.     Molecular Studies:  Biomarker Findings Microsatellite status - MS-Stable Tumor Mutational Burden - 6 Muts/Mb Genomic Findings For a complete list of the genes assayed, please refer to the Appendix. KRAS G12C, amplification ATM S470* CCND1 amplification - equivocal? HGF amplification - equivocal? MYC amplification - equivocal? FGF19 amplification - equivocal? FGF3 amplification - equivocal? FGF4 amplification - equivocal? NFKBIA amplification NKX2-1 amplification RAD21 amplification - equivocal? [id number]*26 TERT promoter -124C>T TP53 rearrangement exon 9 7 Disease relevant genes with no reportable alterations: ALK, BRAF, EGFR, ERBB2, MET, RET, ROS1   PRIOR THERAPY: SRS to the metastatic brain lesions under the care of Dr. Patrcia.  Last treatment on 05/04/2021.   CURRENT THERAPY: Palliative systemic chemotherapy with carboplatin  for an AUC 5, Alimta  500 mg/m2 and, Keytruda  200 mg IV every 3 weeks.  First dose on 05/08/2021.  Status post 53 cycles.  Starting from cycle #5 he is on maintenance treatment with Alimta  and Keytruda  every 3 weeks.  INTERVAL HISTORY: Bradley Goodman 68 y.o. male returns to the clinic today for follow-up visit accompanied by his wife.Discussed the use of AI scribe software for clinical note transcription with the patient, who gave verbal consent to proceed.  History of Present Illness Bradley Goodman is a 68 year old male with stage 4 non-small cell lung  cancer who presents for evaluation and repeat blood work before starting cycle number fifty four of maintenance chemotherapy.  He has stage 4 non-small cell lung cancer with a positive KRAS G12C mutation and PD-L1 expression of 80%, diagnosed in June 2022. Initially, he received palliative systemic chemotherapy with carboplatin , Alimta , and Keytruda  every three weeks for four cycles. Since cycle five, he has been on maintenance treatment with Alimta  and Keytruda  every three weeks and is currently preparing to start cycle number fifty four.  Since his last visit, he underwent scans of the chest, abdomen, and pelvis. The chest scan was primarily to rule out blood clots, which were not present, and there was no disease progression noted. The abdominal scan also showed no concerning findings.  He was recently hospitalized due to sepsis, which he believes was related to cellulitis from a scratch on the back of his leg. He stayed in the hospital for three to four days, during which he celebrated his birthday. He has a history of steroid-induced diabetes, but he is currently off all diabetic and blood pressure medications.  During the review of symptoms, he reported having a cold as his only symptom prior to the hospitalization.    MEDICAL HISTORY: 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   PONV (postoperative nausea and vomiting)    Type 2 diabetes mellitus (HCC)     ALLERGIES:  has no known allergies.  MEDICATIONS:  Current Outpatient Medications  Medication Sig Dispense Refill   acetaminophen  (TYLENOL ) 500 MG tablet Take 500 mg by mouth every 6 (six) hours as needed for mild pain (pain score 1-3) or fever.     chlorproMAZINE  (THORAZINE ) 10 MG tablet Take 1 tablet (10 mg total) by  mouth 3 (three) times daily as needed for hiccoughs. 90 tablet 0   cholecalciferol  (VITAMIN D ) 1000 UNITS tablet Take 1,000 Units by mouth every evening.     Continuous Glucose Sensor (FREESTYLE LIBRE 3 PLUS SENSOR) MISC Change sensor every 15 days. 1 each 11   dexamethasone  (DECADRON ) 1 MG tablet Take 1 tablet (1 mg total) by mouth daily with breakfast. 90 tablet 1   famotidine  (PEPCID ) 10 MG tablet Take 10 mg by mouth at bedtime. Chewable     folic acid  (FOLVITE ) 1 MG tablet Take 1 tablet (1 mg total) by mouth daily. (Patient taking differently: Take 1 mg by mouth at bedtime.) 90 tablet 0   Krill Oil 300 MG CAPS Take 300 mg by mouth every evening.     levothyroxine  (SYNTHROID ) 100 MCG tablet Take 1 tablet (100 mcg total) by mouth daily before breakfast. 90 tablet 1   loratadine  (CLARITIN ) 10 MG tablet Take 10 mg by mouth every evening.      mirtazapine  (REMERON ) 15 MG tablet Take 1 tablet (15 mg total) by mouth at bedtime. 90 tablet 1   Multiple Vitamins-Minerals (ONE A DAY MEN 50 PLUS) TABS Take 1 tablet by mouth at bedtime.     ondansetron  (ZOFRAN ) 4 MG tablet Take 1 tablet (4 mg total) by mouth every 6 (six) hours as needed for nausea. 20 tablet 0   pantoprazole  (PROTONIX ) 40 MG tablet Take 1 tablet (40 mg total) by mouth every evening. 90 tablet 0   polyethylene glycol powder (GLYCOLAX /MIRALAX ) 17 GM/SCOOP powder Take 119 g by mouth daily as needed for mild constipation or moderate constipation.     rosuvastatin  (CRESTOR ) 20 MG tablet Take 1 tablet (20 mg total) by mouth daily. (Patient taking differently: Take 20 mg by mouth at bedtime.) 90 tablet 0   SYRINGE-NEEDLE, DISP, 3 ML (B-D 3CC LUER-LOK SYR 21GX1-1/2) 21G X 1-1/2 3 ML MISC Use to inject testosterone  every week 100 each 2   testosterone  cypionate (DEPOTESTOTERONE CYPIONATE) 100 MG/ML injection Take 50mg  ( 0.5ml) and 100mg  ( 1ml) into muscle alternatively weekly. 10 mL 1   venlafaxine  XR (EFFEXOR -XR) 75 MG 24 hr capsule Take 2  capsules (150 mg total) by mouth daily with breakfast. (Patient taking differently: Take 150 mg by mouth at bedtime.) 180 capsule 2   No current facility-administered medications for this visit.    SURGICAL HISTORY:  Past Surgical History:  Procedure Laterality Date   Bilateral inguinal hernia repair     BRONCHIAL NEEDLE ASPIRATION BIOPSY  04/05/2021   Procedure: BRONCHIAL NEEDLE ASPIRATION BIOPSIES;  Surgeon: Brenna Adine CROME, DO;  Location: [facility];  Service: Pulmonary;;   COLONOSCOPY  01/23/2012   Procedure: COLONOSCOPY;  Surgeon: Lamar CHRISTELLA Hollingshead, MD;  Location: [facility];  Service: Endoscopy;  Laterality: N/A;  9:30 AM   COLONOSCOPY N/A 10/11/2015   Procedure: COLONOSCOPY;  Surgeon: Lamar CHRISTELLA Hollingshead, MD;  Location: [facility];  Service: Endoscopy;  Laterality: N/A;  830   COLONOSCOPY N/A 10/14/2019   Procedure: COLONOSCOPY;  Surgeon: Hollingshead Lamar CHRISTELLA, MD;  Location: [facility];  Service: Endoscopy;  Laterality: N/A;  1:45   POLYPECTOMY  10/14/2019   Procedure: POLYPECTOMY;  Surgeon:  Rourk, Lamar HERO, MD;  Location: [facility];  Service: Endoscopy;;  ascending colon, descending colon   VIDEO BRONCHOSCOPY WITH ENDOBRONCHIAL ULTRASOUND N/A 04/05/2021   Procedure: VIDEO BRONCHOSCOPY WITH ENDOBRONCHIAL ULTRASOUND;  Surgeon: Brenna Adine CROME, DO;  Location: [facility];  Service: Pulmonary;  Laterality: N/A;    REVIEW OF SYSTEMS:  Constitutional: positive for fatigue Eyes: negative Ears, nose, mouth, throat, and face: negative Respiratory: negative Cardiovascular: negative Gastrointestinal: negative Genitourinary:negative Integument/breast: negative Hematologic/lymphatic: negative Musculoskeletal:negative Neurological: negative Behavioral/Psych: negative Endocrine: negative Allergic/Immunologic: negative   PHYSICAL EXAMINATION: General appearance: alert, cooperative, appears stated age, fatigued, and no distress Head: Normocephalic, without obvious abnormality,  atraumatic Neck: no adenopathy, no JVD, supple, symmetrical, trachea midline, and thyroid  not enlarged, symmetric, no tenderness/mass/nodules Lymph nodes: Cervical, supraclavicular, and axillary nodes normal. Resp: clear to auscultation bilaterally Back: symmetric, no curvature. ROM normal. No CVA tenderness. Cardio: regular rate and rhythm, S1, S2 normal, no murmur, click, rub or gallop GI: soft, non-tender; bowel sounds normal; no masses,  no organomegaly Extremities: extremities normal, atraumatic, no cyanosis or edema Neurologic: Alert and oriented X 3, normal strength and tone. Normal symmetric reflexes. Normal coordination and gait  ECOG PERFORMANCE STATUS: 1 - Symptomatic but completely ambulatory  Blood pressure (!) 142/86, pulse (!) 109, temperature 97.8 F (36.6 C), resp. rate 17, height 5' 11 (1.803 m), weight 189 lb 14.4 oz (86.1 kg), SpO2 97%.  LABORATORY DATA: Lab Results  Component Value Date   WBC 10.4 06/29/2024   HGB 10.5 (L) 06/29/2024   HCT 35.3 (L) 06/29/2024   MCV 84.2 06/29/2024   PLT 436 (H) 06/29/2024      Chemistry      Component Value Date/Time   NA 137 06/16/2024 0438   NA 145 (H) 04/22/2024 0917   K 3.8 06/16/2024 0438   CL 107 06/16/2024 0438   CO2 22 06/16/2024 0438   BUN 14 06/16/2024 0438   BUN 10 04/22/2024 0917   CREATININE 1.05 06/16/2024 0438   CREATININE 0.96 06/09/2024 0835      Component Value Date/Time   CALCIUM  8.4 (L) 06/16/2024 0438   ALKPHOS 47 06/16/2024 0555   AST 41 06/16/2024 0555   AST 21 06/09/2024 0835   ALT 27 06/16/2024 0555   ALT 25 06/09/2024 0835   BILITOT 0.4 06/16/2024 0555   BILITOT 0.3 06/09/2024 0835       RADIOGRAPHIC STUDIES: ECHOCARDIOGRAM LIMITED Result Date: 06/16/2024    ECHOCARDIOGRAM LIMITED REPORT   Patient Name:   Bradley Goodman Date of Exam: 06/16/2024 Medical Rec #:  [id number]       Height:       71.0 in Accession #:    [id number]      Weight:       190.0 lb Date of Birth:  May 12, 1956         BSA:          2.063 m Patient Age:    68 years        BP:           131/56 mmHg Patient Gender: M               HR:           100 bpm. Exam Location:  [facility] Procedure: Limited Echo and Cardiac Doppler Indications:    Pericardial effusion I31.3  History:        Patient has prior history of Echocardiogram examinations, most  recent 06/14/2024. Risk Factors:Hypertension, Diabetes and                 Dyslipidemia.  Sonographer:    Aida Pizza RCS Referring Phys: [phone] DAVID TAT IMPRESSIONS  1. Limited Echocardiogram to evaluate for pericardial effusion.  2. Moderate pericardial effusion posterior to the left ventricle, left atrium and localized near the right atrium. No evidence of cardiac tamponade. Pericardial effusion anterior to the right ventricle is completely resolved compared to prior study. Prior echo images reviewed that showed moderate circumferential pericardial effusion.  3. Left ventricular ejection fraction, by estimation, is 60 to 65%. The left ventricle has normal function. The left ventricle has no regional wall motion abnormalities. There is mild left ventricular hypertrophy. Left ventricular diastolic function could not be evaluated.  4. Right ventricular systolic function is normal. The right ventricular size is normal. Tricuspid regurgitation signal is inadequate for assessing PA pressure.  5. The inferior vena cava is dilated in size with >50% respiratory variability, suggesting right atrial pressure of 8 mmHg. Comparison(s): Changes from prior study are noted. As above. FINDINGS  Left Ventricle: Left ventricular ejection fraction, by estimation, is 60 to 65%. The left ventricle has normal function. The left ventricle has no regional wall motion abnormalities. The left ventricular internal cavity size was normal in size. There is  mild left ventricular hypertrophy. Left ventricular diastolic function could not be evaluated. Right Ventricle: The right ventricular size is normal.  No increase in right ventricular wall thickness. Right ventricular systolic function is normal. Tricuspid regurgitation signal is inadequate for assessing PA pressure. Left Atrium: Left atrial size was not assessed. Right Atrium: Right atrial size was not assessed. Pericardium: A moderately sized pericardial effusion is present. The pericardial effusion is posterior to the left ventricle and the left atrium and localized near the right atrium. Mitral Valve: The mitral valve is normal in structure. Tricuspid Valve: The tricuspid valve is normal in structure. Aortic Valve: The aortic valve was not assessed. Pulmonic Valve: The pulmonic valve was not assessed. Aorta: The aortic root is normal in size and structure. Venous: The inferior vena cava is dilated in size with greater than 50% respiratory variability, suggesting right atrial pressure of 8 mmHg. IAS/Shunts: No atrial level shunt detected by color flow Doppler. LEFT VENTRICLE PLAX 2D LVIDd:         4.30 cm LVIDs:         3.00 cm LV PW:         1.20 cm LV IVS:        1.20 cm LVOT diam:     2.00 cm LVOT Area:     3.14 cm  RIGHT VENTRICLE TAPSE (M-mode): 2.3 cm LEFT ATRIUM         Index LA diam:    3.40 cm 1.65 cm/m   AORTA Ao Root diam: 3.60 cm  SHUNTS Systemic Diam: 2.00 cm Vishnu Priya Mallipeddi Electronically signed by Diannah Late Mallipeddi Signature Date/Time: 06/16/2024/4:12:21 PM    Final    CT ABDOMEN PELVIS W CONTRAST Result Date: 06/16/2024 EXAM: CT ABDOMEN AND PELVIS WITH CONTRAST 06/16/2024 12:44:30 AM TECHNIQUE: CT of the abdomen and pelvis was performed with the administration of intravenous contrast. Multiplanar reformatted images are provided for review. Automated exposure control, iterative reconstruction, and/or weight-based adjustment of the mA/kV was utilized to reduce the radiation dose to as low as reasonably achievable. COMPARISON: CT 04/20/2024 CLINICAL HISTORY: Abdominal pain, acute, nonlocalized. Sudden onset mid abd pain and  vomiting. FINDINGS: LOWER  CHEST: No acute abnormality. LIVER: Hepatic steatosis. GALLBLADDER AND BILE DUCTS: Gallbladder is unremarkable. No biliary ductal dilatation. SPLEEN: No acute abnormality. PANCREAS: No acute abnormality. ADRENAL GLANDS: No acute abnormality. KIDNEYS, URETERS AND BLADDER: Nonobstructing left nephrolithiasis. No hydronephrosis. No perinephric or periureteral stranding. Urinary bladder is unremarkable. GI AND BOWEL: Moderate colonic stool burden. Normal appendix. Mild infiltration of the fat adjacent to the distal duodenum and proximal jejunum near the ligament of treitz. This could be due to mild enteritis. PERITONEUM AND RETROPERITONEUM: No ascites. No free air. VASCULATURE: Aortic atherosclerotic calcification. LYMPH NODES: No lymphadenopathy. REPRODUCTIVE ORGANS: No acute abnormality. BONES AND SOFT TISSUES: No acute osseous abnormality. No focal soft tissue abnormality. IMPRESSION: 1. Mild infiltration of the fat adjacent to the distal duodenum and proximal jejunum near the ligament of Treitz, possibly due to mild enteritis. 2. Hepatic steatosis. 3. Nonobstructing left nephrolithiasis without hydronephrosis. 4. Moderate colonic stool burden. Electronically signed by: Norman Gatlin MD 06/16/2024 12:59 AM EDT RP Workstation: [id number]   ECHOCARDIOGRAM COMPLETE Result Date: 06/14/2024    ECHOCARDIOGRAM REPORT   Patient Name:   Bradley Goodman Date of Exam: 06/14/2024 Medical Rec #:  [id number]       Height:       71.0 in Accession #:    [id number]      Weight:       190.0 lb Date of Birth:  November 26, 1955        BSA:          2.063 m Patient Age:    68 years        BP:           114/79 mmHg Patient Gender: M               HR:           103 bpm. Exam Location:  [facility] Procedure: 2D Echo, Color Doppler and Cardiac Doppler (Both Spectral and Color            Flow Doppler were utilized during procedure). Indications:    Percardial Effusion I31.3  History:        Patient has prior history of  Echocardiogram examinations, most                 recent 10/13/2016. Risk Factors:Diabetes, Dyslipidemia,                 Hypertension and Former Smoker.  Sonographer:    Koleen Popper RDCS Referring Phys: [phone] DAVID TAT IMPRESSIONS  1. Left ventricular ejection fraction, by estimation, is 55 to 60%. The left ventricle has normal function. The left ventricle has no regional wall motion abnormalities. Left ventricular diastolic parameters are indeterminate.  2. Right ventricular systolic function is normal. The right ventricular size is normal. Tricuspid regurgitation signal is inadequate for assessing PA pressure.  3. Moderate pericardial effusion. The pericardial effusion is circumferential. No diagnostic respiratory variation in mitral or tricuspid inflow to suggest tamponade. There is mild, early diastolic undulation in the RA and RV free wall - consider followup imaging to reassess effusion size, particularly if clinical status worsens.  4. The mitral valve is grossly normal. Trivial mitral valve regurgitation.  5. The aortic valve is tricuspid. There is mild calcification of the aortic valve. Aortic valve regurgitation is trivial.  6. The inferior vena cava is normal in size with greater than 50% respiratory variability, suggesting right atrial pressure of 3 mmHg. Comparison(s): Prior images unable to be directly viewed. FINDINGS  Left Ventricle:  Left ventricular ejection fraction, by estimation, is 55 to 60%. The left ventricle has normal function. The left ventricle has no regional wall motion abnormalities. The left ventricular internal cavity size was normal in size. There is  borderline left ventricular hypertrophy. Left ventricular diastolic parameters are indeterminate. Right Ventricle: The right ventricular size is normal. No increase in right ventricular wall thickness. Right ventricular systolic function is normal. Tricuspid regurgitation signal is inadequate for assessing PA pressure. Left Atrium:  Left atrial size was normal in size. Right Atrium: Right atrial size was normal in size. Pericardium: A moderately sized pericardial effusion is present. The pericardial effusion is circumferential. Mitral Valve: The mitral valve is grossly normal. Trivial mitral valve regurgitation. Tricuspid Valve: The tricuspid valve is grossly normal. Tricuspid valve regurgitation is trivial. Aortic Valve: The aortic valve is tricuspid. There is mild calcification of the aortic valve. Aortic valve regurgitation is trivial. Aortic valve peak gradient measures 12.7 mmHg. Pulmonic Valve: The pulmonic valve was not well visualized. Pulmonic valve regurgitation is trivial. Aorta: The aortic root and ascending aorta are structurally normal, with no evidence of dilitation. Venous: The inferior vena cava is normal in size with greater than 50% respiratory variability, suggesting right atrial pressure of 3 mmHg. IAS/Shunts: No atrial level shunt detected by color flow Doppler. Additional Comments: 3D was performed not requiring image post processing on an independent workstation and was indeterminate.  LEFT VENTRICLE PLAX 2D LVIDd:         4.40 cm   Diastology LVIDs:         3.20 cm   LV e' medial:    5.33 cm/s LV PW:         1.00 cm   LV E/e' medial:  17.3 LV IVS:        1.20 cm   LV e' lateral:   5.87 cm/s LVOT diam:     2.20 cm   LV E/e' lateral: 15.7 LV SV:         70 LV SV Index:   34 LVOT Area:     3.80 cm  RIGHT VENTRICLE             IVC RV Basal diam:  3.80 cm     IVC diam: 1.80 cm RV S prime:     15.00 cm/s TAPSE (M-mode): 2.1 cm LEFT ATRIUM             Index        RIGHT ATRIUM           Index LA diam:        3.50 cm 1.70 cm/m   RA Area:     14.30 cm LA Vol (A2C):   37.8 ml 18.32 ml/m  RA Volume:   32.70 ml  15.85 ml/m LA Vol (A4C):   37.8 ml 18.32 ml/m LA Biplane Vol: 38.2 ml 18.52 ml/m  AORTIC VALVE AV Area (Vmax): 2.37 cm AV Vmax:        178.00 cm/s AV Peak Grad:   12.7 mmHg LVOT Vmax:      111.00 cm/s LVOT Vmean:      73.900 cm/s LVOT VTI:       0.183 m  AORTA Ao Root diam: 3.20 cm Ao Asc diam:  3.60 cm MITRAL VALVE MV Area (PHT): 5.84 cm     SHUNTS MV Decel Time: 130 msec     Systemic VTI:  0.18 m MV E velocity: 92.00 cm/s   Systemic Diam: 2.20 cm MV A  velocity: 134.00 cm/s MV E/A ratio:  0.69 Jayson Sierras MD Electronically signed by Jayson Sierras MD Signature Date/Time: 06/14/2024/11:13:48 AM    Final    CT Angio Chest PE W/Cm &/Or Wo Cm Result Date: 06/13/2024 CLINICAL DATA:  Weakness and chest tightness. EXAM: CT ANGIOGRAPHY CHEST WITH CONTRAST TECHNIQUE: Multidetector CT imaging of the chest was performed using the standard protocol during bolus administration of intravenous contrast. Multiplanar CT image reconstructions and MIPs were obtained to evaluate the vascular anatomy. RADIATION DOSE REDUCTION: This exam was performed according to the departmental dose-optimization program which includes automated exposure control, adjustment of the mA and/or kV according to patient size and/or use of iterative reconstruction technique. CONTRAST:  75mL OMNIPAQUE  IOHEXOL  350 MG/ML SOLN COMPARISON:  None Available. FINDINGS: Cardiovascular: There is moderate to marked severity calcification of the aortic arch, without evidence of aortic aneurysm. Satisfactory opacification of the pulmonary arteries to the segmental level. No evidence of pulmonary embolism. Normal heart size with mild coronary artery calcification. There is a small pericardial effusion which measures 1.3 cm in maximum thickness and is increased in size when compared to the prior study. Mediastinum/Nodes: No enlarged mediastinal, hilar, or axillary lymph nodes. Thyroid  gland, trachea, and esophagus demonstrate no significant findings. Lungs/Pleura: Stable, ill-defined 5 mm and 7 mm nodular appearing areas are seen along the periphery of the lateral and posterolateral right upper lobe (axial CT image 50, CT series 6). An additional stable 4 mm lateral right upper  lobe pulmonary nodule is seen (axial CT image 44, CT series 6). A small, slightly patchy, nodular appearing area is seen within the right upper lobe (axial CT images 61 through 67, CT series 6). This was described as a 7 mm irregular nodularity on the prior study, and is less distinct on the current exam (measures approximately 1.7 cm). A stable 3 mm anteromedial left upper lobe pulmonary nodule is seen (axial CT image 61, CT series 6). A stable, 5 mm ground-glass pulmonary nodule is seen within the posterolateral aspect of the left upper lobe (axial CT image 74, CT series 6). A trace amount of pleural fluid is present, bilaterally. No pneumothorax is identified. Upper Abdomen: There is diffuse fatty infiltration of the liver parenchyma. An 8 mm nonobstructing left renal calculus is present. Musculoskeletal: No chest wall abnormality. No acute or significant osseous findings. Review of the MIP images confirms the above findings. IMPRESSION: 1. No evidence of pulmonary embolism. 2. Small pericardial effusion, increased in size when compared to the prior study. 3. Multiple bilateral pulmonary nodules, as described above, with a small, ill-defined nodular appearing area within the right upper lobe that is mildly increased in size when compared to the prior study. Further evaluation is recommended to exclude focal progression of neoplastic disease. 4. Trace amount of bilateral pleural fluid. 5. Hepatic steatosis. 6. 8 mm nonobstructing left renal calculus. 7. Aortic atherosclerosis. Electronically Signed   By: Suzen Dials M.D.   On: 06/13/2024 19:05   DG Abdomen 1 View Result Date: 06/13/2024 CLINICAL DATA:  Constipation.  Chest tightness. EXAM: ABDOMEN - 1 VIEW COMPARISON:  None Available. FINDINGS: There is a non obstructive bowel gas pattern. No supine evidence of free air. No organomegaly or suspicious calcification. No acute bony abnormality. Moderate stool burden. IMPRESSION: Moderate stool burden.  No  acute findings. Electronically Signed   By: Franky Crease M.D.   On: 06/13/2024 17:41   DG Chest 2 View Result Date: 06/13/2024 CLINICAL DATA:  Chest pain EXAM: CHEST - 2  VIEW COMPARISON:  02/29/2024 FINDINGS: Heart and mediastinal contours are within normal limits. No focal opacities or effusions. No acute bony abnormality. IMPRESSION: No active cardiopulmonary disease. Electronically Signed   By: Franky Crease M.D.   On: 06/13/2024 17:41      ASSESSMENT AND PLAN: This is a very pleasant 68 years old white male recently diagnosed with stage IV (T1b, N3, M1 C) non-small cell lung cancer favoring adenocarcinoma presented with right upper lobe lung nodule in addition to right hilar, subcarinal and bilateral mediastinal as well as supraclavicular lymphadenopathy.  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  His PD-L1 expression is 80% and his molecular studies showed KRAS G12C mutation. The patient underwent SRS to metastatic brain lesion under the care of Dr. Patrcia and he is currently undergoing systemic chemotherapy with carboplatin  for AUC of 5, Alimta  500 Mg/M2 and Keytruda  200 Mg IV every 3 weeks status post 53 cycles.  Starting from cycle #5 the patient will be treated with maintenance treatment with Alimta  and Keytruda  every 3 weeks.  The patient continues to tolerate this treatment fairly well. He was admitted to the hospital recently for cellulitis of the lower extremity treated with a course of antibiotic.  During his hospitalization he had CT of the chest, abdomen and pelvis that showed no evidence for disease progression. 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 No disease progression on recent imaging. Hemoglobin levels are stable, and there is no indication to hold treatment. - Proceed with cycle 54 of maintenance  therapy with pemetrexed  and pembrolizumab . - Instruct to call if any issues arise.  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  Recent sepsis secondary to cellulitis of the leg Resolved after hospitalization for 3-4 days. He was advised to call immediately if he has any concerning symptoms in the interval. The patient voices understanding of current disease status and treatment options and is in agreement with the current care plan.  All questions were answered. The patient knows to call the clinic with any problems, questions or concerns. We can certainly see the patient much sooner if necessary. The total time spent in the appointment was 30 minutes.  Disclaimer: This note was dictated with voice recognition software. Similar sounding words can inadvertently be transcribed and may not be corrected upon review.

## 2024-06-29 NOTE — Patient Instructions (Signed)
 [facility] - A DEPT OF [facility]  Discharge Instructions: Thank you for choosing [facility] to provide your oncology and hematology care.   If you have a lab appointment with the Cancer Center, please go directly to the Cancer Center and check in at the registration area.   Wear comfortable clothing and clothing appropriate for easy access to any Portacath or PICC line.   We strive to give you quality time with your provider. You may need to reschedule your appointment if you arrive late (15 or more minutes).  Arriving late affects you and other patients whose appointments are after yours.  Also, if you miss three or more appointments without notifying the office, you may be dismissed from the clinic at the provider's discretion.      For prescription refill requests, have your pharmacy contact our office and allow 72 hours for refills to be completed.    Today you received the following chemotherapy and/or immunotherapy agents: Pembrolizumab  (Keytruda ), Alimta  (Pemetrexed ), B12       To help prevent nausea and vomiting after your treatment, we encourage you to take your nausea medication as directed.  BELOW ARE SYMPTOMS THAT SHOULD BE REPORTED IMMEDIATELY: *FEVER GREATER THAN 100.4 F (38 C) OR HIGHER *CHILLS OR SWEATING *NAUSEA AND VOMITING THAT IS NOT CONTROLLED WITH YOUR NAUSEA MEDICATION *UNUSUAL SHORTNESS OF BREATH *UNUSUAL BRUISING OR BLEEDING *URINARY PROBLEMS (pain or burning when urinating, or frequent urination) *BOWEL PROBLEMS (unusual diarrhea, constipation, pain near the anus) TENDERNESS IN MOUTH AND THROAT WITH OR WITHOUT PRESENCE OF ULCERS (sore throat, sores in mouth, or a toothache) UNUSUAL RASH, SWELLING OR PAIN  UNUSUAL VAGINAL DISCHARGE OR ITCHING   Items with * indicate a potential emergency and should be followed up as soon as possible or go to the Emergency Department if any problems should occur.  Please show  the CHEMOTHERAPY ALERT CARD or IMMUNOTHERAPY ALERT CARD at check-in to the Emergency Department and triage nurse.  Should you have questions after your visit or need to cancel or reschedule your appointment, please contact [facility] - A DEPT OF [facility]  Dept: [phone]  and follow the prompts.  Office hours are 8:00 a.m. to 4:30 p.m. Monday - Friday. Please note that voicemails left after 4:00 p.m. may not be returned until the following business day.  We are closed weekends and major holidays. You have access to a nurse at all times for urgent questions. Please call the main number to the clinic Dept: [phone] and follow the prompts.   For any non-urgent questions, you may also contact your provider using MyChart. We now offer e-Visits for anyone 49 and older to request care online for non-urgent symptoms. For details visit [email or web address].   Also download the MyChart app! Go to the app store, search MyChart, open the app, select [facility], and log in with your MyChart username and password.

## 2024-07-02 ENCOUNTER — Other Ambulatory Visit (HOSPITAL_COMMUNITY): Payer: Self-pay

## 2024-07-02 ENCOUNTER — Other Ambulatory Visit: Payer: Self-pay

## 2024-07-02 ENCOUNTER — Encounter: Payer: Self-pay | Admitting: Family Medicine

## 2024-07-10 ENCOUNTER — Other Ambulatory Visit: Payer: Self-pay | Admitting: Physician Assistant

## 2024-07-10 ENCOUNTER — Encounter: Payer: Self-pay | Admitting: Internal Medicine

## 2024-07-10 ENCOUNTER — Other Ambulatory Visit (HOSPITAL_COMMUNITY)
Admission: RE | Admit: 2024-07-10 | Discharge: 2024-07-10 | Disposition: A | Discharge status: Home / Self Care | Source: Ambulatory Visit | Attending: "Endocrinology | Admitting: "Endocrinology

## 2024-07-10 DIAGNOSIS — E291 Testicular hypofunction: Secondary | ICD-10-CM | POA: Diagnosis not present

## 2024-07-10 DIAGNOSIS — E039 Hypothyroidism, unspecified: Secondary | ICD-10-CM | POA: Diagnosis not present

## 2024-07-10 DIAGNOSIS — E2740 Unspecified adrenocortical insufficiency: Secondary | ICD-10-CM | POA: Diagnosis not present

## 2024-07-10 DIAGNOSIS — C3491 Malignant neoplasm of unspecified part of right bronchus or lung: Secondary | ICD-10-CM

## 2024-07-10 LAB — COMPREHENSIVE METABOLIC PANEL WITH GFR
ALT: 29 U/L (ref 0–44)
AST: 27 U/L (ref 15–41)
Albumin: 3.8 g/dL (ref 3.5–5.0)
Alkaline Phosphatase: 73 U/L (ref 38–126)
Anion gap: 13 (ref 5–15)
BUN: 19 mg/dL (ref 8–23)
CO2: 27 mmol/L (ref 22–32)
Calcium: 10.6 mg/dL — ABNORMAL HIGH (ref 8.9–10.3)
Chloride: 103 mmol/L (ref 98–111)
Creatinine, Ser: 0.95 mg/dL (ref 0.61–1.24)
GFR, Estimated: >60 mL/min (ref >60)
Glucose, Bld: 113 mg/dL — ABNORMAL HIGH (ref 70–99)
Potassium: 3.9 mmol/L (ref 3.5–5.1)
Sodium: 142 mmol/L (ref 135–145)
Total Bilirubin: <0.2 mg/dL (ref 0.0–1.2)
Total Protein: 7.1 g/dL (ref 6.5–8.1)

## 2024-07-10 LAB — PSA: Prostatic Specific Antigen: 2.85 ng/mL (ref 0.00–4.00)

## 2024-07-10 LAB — T4, FREE: Free T4: 0.78 ng/dL (ref 0.61–1.12)

## 2024-07-13 ENCOUNTER — Other Ambulatory Visit: Payer: Self-pay | Admitting: Internal Medicine

## 2024-07-13 DIAGNOSIS — C3491 Malignant neoplasm of unspecified part of right bronchus or lung: Secondary | ICD-10-CM

## 2024-07-13 LAB — TESTOSTERONE,FREE AND TOTAL
Testosterone, Free: 2 pg/mL — ABNORMAL LOW (ref 6.6–18.1)
Testosterone: 265 ng/dL (ref 264–916)

## 2024-07-15 ENCOUNTER — Encounter: Payer: Self-pay | Admitting: "Endocrinology

## 2024-07-15 ENCOUNTER — Other Ambulatory Visit (HOSPITAL_COMMUNITY): Payer: Self-pay

## 2024-07-15 ENCOUNTER — Encounter: Payer: Self-pay | Admitting: Internal Medicine

## 2024-07-15 ENCOUNTER — Other Ambulatory Visit: Payer: Self-pay

## 2024-07-15 ENCOUNTER — Ambulatory Visit: Admitting: "Endocrinology

## 2024-07-15 VITALS — BP 136/86 | HR 100 | Ht 71.0 in | Wt 188.2 lb

## 2024-07-15 DIAGNOSIS — E119 Type 2 diabetes mellitus without complications: Secondary | ICD-10-CM

## 2024-07-15 DIAGNOSIS — E2740 Unspecified adrenocortical insufficiency: Secondary | ICD-10-CM | POA: Diagnosis not present

## 2024-07-15 DIAGNOSIS — E230 Hypopituitarism: Secondary | ICD-10-CM

## 2024-07-15 DIAGNOSIS — E039 Hypothyroidism, unspecified: Secondary | ICD-10-CM

## 2024-07-15 DIAGNOSIS — E291 Testicular hypofunction: Secondary | ICD-10-CM

## 2024-07-15 MED ORDER — LEVOTHYROXINE SODIUM 112 MCG PO TABS
112.0000 ug | ORAL_TABLET | Freq: Every day | ORAL | 1 refills | Status: AC
  Filled 2024-07-15 – 2024-07-22 (×2): qty 90, 90d supply, fill #0
  Filled 2024-10-15: qty 90, 90d supply, fill #1

## 2024-07-15 MED ORDER — TESTOSTERONE CYPIONATE 100 MG/ML IM SOLN
INTRAMUSCULAR | 1 refills | Status: AC
  Filled 2024-07-15 – 2024-07-22 (×2): qty 10, 90d supply, fill #0
  Filled 2024-07-27: qty 10, 84d supply, fill #0
  Filled 2024-11-11: qty 10, 84d supply, fill #1

## 2024-07-15 NOTE — Progress Notes (Signed)
 Endocrinology follow-up note                                             07/15/2024, 1:58 PM   Subjective:    Patient ID: Bradley Goodman, male    DOB: [birth year], PCP [name], FNP   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   PONV (postoperative nausea and vomiting)    Type 2 diabetes mellitus (HCC)    Past Surgical History:  Procedure Laterality Date   Bilateral inguinal hernia repair     BRONCHIAL NEEDLE ASPIRATION BIOPSY  04/05/2021   Procedure: BRONCHIAL NEEDLE ASPIRATION BIOPSIES;  Surgeon: Brenna Adine CROME, DO;  Location: [facility];  Service: Pulmonary;;   COLONOSCOPY  01/23/2012   Procedure: COLONOSCOPY;  Surgeon: Lamar HERO Hollingshead, MD;  Location: [facility];  Service: Endoscopy;  Laterality: N/A;  9:30 AM   COLONOSCOPY N/A 10/11/2015   Procedure: COLONOSCOPY;  Surgeon: Lamar HERO Hollingshead, MD;  Location: [facility];  Service: Endoscopy;  Laterality: N/A;  830   COLONOSCOPY N/A 10/14/2019   Procedure: COLONOSCOPY;  Surgeon: Hollingshead Lamar HERO, MD;  Location: [facility];  Service: Endoscopy;  Laterality: N/A;  1:45   POLYPECTOMY  10/14/2019   Procedure: POLYPECTOMY;  Surgeon: Hollingshead Lamar HERO, MD;  Location: [facility];  Service: Endoscopy;;  ascending colon, descending colon   VIDEO BRONCHOSCOPY WITH ENDOBRONCHIAL ULTRASOUND N/A 04/05/2021   Procedure: VIDEO BRONCHOSCOPY WITH ENDOBRONCHIAL ULTRASOUND;  Surgeon: Brenna Adine CROME, DO;  Location: [facility];  Service: Pulmonary;  Laterality: N/A;   Social History   Socioeconomic History   Marital status: Married    Spouse name: Not on file   Number of children: Not on file   Years of education: Not on file   Highest education level: Bachelor's degree (e.g., BA, AB, BS)  Occupational History    Not on file  Tobacco Use   Smoking status: Every Day    Current packs/day: 0.50    Average packs/day: 0.5 packs/day for 50.7 years (25.4 ttl pk-yrs)    Types: Cigarettes    Start date: 10/30/1973   Smokeless tobacco: Never  Vaping Use   Vaping status: Never Used  Substance and Sexual Activity   Alcohol use: Not Currently    Comment: One drink every 6 months.   Drug use: No   Sexual activity: Yes  Other Topics Concern   Not on file  Social History Narrative   Not on file   Social Drivers of Health   Financial Resource Strain: Low Risk  (04/19/2024)   Overall Financial Resource Strain (CARDIA)    Difficulty of Paying Living Expenses: Not hard at all  Food Insecurity: No Food Insecurity (06/13/2024)   Hunger Vital Sign    Worried About Running Out of Food in the Last Year: Never true    Ran Out of Food in the Last Year: Never true  Transportation Needs: No Transportation Needs (06/13/2024)   PRAPARE - Administrator, Civil Service (Medical): No    Lack of Transportation (Non-Medical): No  Physical Activity: Unknown (04/19/2024)   Exercise Vital Sign    Days of Exercise per Week: 1 day    Minutes of Exercise per Session: Patient declined  Stress: No Stress Concern Present (04/19/2024)   [employer] of Occupational Health - Occupational Stress Questionnaire    Feeling of Stress: Not at all  Social Connections: Unknown (06/13/2024)   Social Connection and Isolation Panel    Frequency of Communication with Friends and Family: More than three times a week    Frequency of Social Gatherings with Friends and Family: More than three times a week    Attends Religious Services: Not on Marketing executive or Organizations: Not on file    Attends Banker Meetings: Not on file    Marital Status: Married  Recent Concern: Social Connections - Moderately Isolated (04/19/2024)   Social Connection and Isolation Panel    Frequency of Communication with Friends  and Family: More than three times a week    Frequency of Social Gatherings with Friends and Family: Once a week    Attends Religious Services: Never    Database administrator or Organizations: No    Attends Engineer, structural: Not on file    Marital Status: Married   Family History  Problem Relation Age of Onset   Hypertension Mother    Diabetes Mother    Heart attack Mother    Hypertension Father    Heart attack Father    Heart attack Brother    Colon cancer Neg Hx    Outpatient Encounter Medications as of 07/15/2024  Medication Sig   acetaminophen  (TYLENOL ) 500 MG tablet Take 500 mg by mouth every 6 (six) hours as needed for mild pain (pain score 1-3) or fever.   chlorproMAZINE  (THORAZINE ) 10 MG tablet Take 1 tablet (10 mg total) by mouth 3 (three) times daily as needed for hiccoughs.   cholecalciferol  (VITAMIN D ) 1000 UNITS tablet Take 1,000 Units by mouth every evening.   Continuous Glucose Sensor (FREESTYLE LIBRE 3 PLUS SENSOR) MISC Change sensor every 15 days.   dexamethasone  (DECADRON ) 1 MG tablet Take 1 tablet (1 mg total) by mouth daily with breakfast.   famotidine  (PEPCID ) 10 MG tablet Take 10 mg by mouth at bedtime. Chewable   folic acid  (FOLVITE ) 1 MG tablet Take 1 tablet (1 mg total) by mouth daily. (Patient taking differently: Take 1 mg by mouth at bedtime.)   Krill Oil 300 MG CAPS Take 300 mg by mouth every evening.   levothyroxine  (SYNTHROID ) 112 MCG tablet Take 1 tablet (112 mcg total) by mouth daily before breakfast.   loratadine  (CLARITIN ) 10 MG tablet Take 10 mg by mouth every evening.    mirtazapine  (REMERON ) 15 MG tablet Take 1 tablet (15 mg total) by mouth at bedtime.   Multiple Vitamins-Minerals (ONE A DAY MEN 50 PLUS) TABS Take 1 tablet by mouth at bedtime.   ondansetron  (ZOFRAN ) 4 MG tablet Take 1 tablet (4 mg total) by mouth every 6 (six) hours as needed for nausea.   pantoprazole  (PROTONIX ) 40 MG tablet Take 1 tablet (40 mg total) by mouth every  evening.   polyethylene glycol powder (GLYCOLAX /MIRALAX ) 17 GM/SCOOP powder Take 119 g by mouth daily as needed for mild constipation or moderate constipation.   rosuvastatin  (CRESTOR ) 20 MG tablet Take 1 tablet (20 mg total) by mouth daily. (Patient taking differently: Take 20 mg by mouth at bedtime.)   SYRINGE-NEEDLE, DISP, 3 ML (B-D  3CC LUER-LOK SYR 21GX1-1/2) 21G X 1-1/2 3 ML MISC Use to inject testosterone  every week   testosterone  cypionate (DEPOTESTOTERONE CYPIONATE) 100 MG/ML injection Take 50mg  ( 0.39ml) and 100mg  ( 1ml) into muscle alternatively weekly.   venlafaxine  XR (EFFEXOR -XR) 75 MG 24 hr capsule Take 2 capsules (150 mg total) by mouth daily with breakfast. (Patient taking differently: Take 150 mg by mouth at bedtime.)   [DISCONTINUED] levothyroxine  (SYNTHROID ) 100 MCG tablet Take 1 tablet (100 mcg total) by mouth daily before breakfast.   [DISCONTINUED] testosterone  cypionate (DEPOTESTOTERONE CYPIONATE) 100 MG/ML injection Take 50mg  ( 0.50ml) and 100mg  ( 1ml) into muscle alternatively weekly.   No facility-administered encounter medications on file as of 07/15/2024.   ALLERGIES: No Known Allergies  VACCINATION STATUS: Immunization History  Administered Date(s) Administered   Fluad Quad(high Dose 65+) 07/28/2022   Hepatitis B 02/06/1990, 10/28/1990, 04/02/1991   Influenza,inj,Quad PF,6+ Mos 07/07/2019, 07/31/2021   Influenza-Unspecified 07/25/2018   Moderna Sars-Covid-2 Vaccination 12/01/2019, 12/30/2019, 10/19/2020   Pneumococcal Conjugate-13 03/11/2017   Tdap 01/17/2016    HPI Bradley Goodman is 68 y.o. male who presents today for follow-up after he was seen in consultation for multiple medical problems as follows.  See notes from previous visits.  He is accompanied by his wife.  He did have recent hospitalization for sepsis. 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   He is status post radiosurgery of CNS  metastasis and chemotherapy.  His chemotherapy  involved high-dose steroids in the form of dexamethasone  with subsequent hypopituitarism.   His subsequent labs showed hypogonadism, partial adrenal insufficiency, and hypothyroidism.  For this endocrine deficits, he was started on glucocorticoids for replacement, testosterone  injection 50 mg 100 mg IM in alternate weeks, levothyroxine .  He remains on dexamethasone  1 mg p.o. daily in the morning for glucocorticoid deficit.   He denies prior testicular injury.  He denies testicular radiation . His previsit thyroid  function tests are consistent with slight under replacement.  He is currently on levothyroxine  100 mcg p.o. daily before breakfast.    He wishes to be continued on testosterone  replacement therapy as well as his other hormones.  Continues to gain weight progressively.  He continues to smoke.    Review of Systems  Constitutional: + Stable weight,   + fatigue, + subjective hypothermia, + low libido  Objective:       07/15/2024   10:51 AM 06/29/2024    2:25 PM 06/29/2024   11:30 AM  Vitals with BMI  Height 5' 11    Weight 188 lbs 3 oz    BMI 26.26    Systolic 136 151 857  Diastolic 86 93 86  Pulse 100 91     BP 136/86   Pulse 100   Ht 5' 11 (1.803 m)   Wt 188 lb 3.2 oz (85.4 kg)   BMI 26.25 kg/m   Wt Readings from Last 3 Encounters:  07/15/24 188 lb 3.2 oz (85.4 kg)  06/29/24 189 lb 14.4 oz (86.1 kg)  06/23/24 185 lb 9.6 oz (84.2 kg)    Physical Exam  Constitutional:  Body mass index is 26.25 kg/m.,  not in acute distress, normal state of mind Eyes: PERRLA, EOMI, no exophthalmos ENT: moist mucous membranes, no gross thyromegaly, no gross cervical lymphadenopathy   CMP ( most recent) CMP     Component Value Date/Time   NA 142 07/10/2024 0801   NA 145 (H) 04/22/2024 0917   K 3.9 07/10/2024 0801  CL 103 07/10/2024 0801   CO2 27 07/10/2024 0801   GLUCOSE 113 (H) 07/10/2024 0801   BUN 19 07/10/2024 0801    BUN 10 04/22/2024 0917   CREATININE 0.95 07/10/2024 0801   CREATININE 0.96 06/29/2024 1112   CALCIUM  10.6 (H) 07/10/2024 0801   PROT 7.1 07/10/2024 0801   PROT 6.8 04/22/2024 0917   ALBUMIN 3.8 07/10/2024 0801   ALBUMIN 3.9 04/22/2024 0917   AST 27 07/10/2024 0801   AST 19 06/29/2024 1112   ALT 29 07/10/2024 0801   ALT 19 06/29/2024 1112   ALKPHOS 73 07/10/2024 0801   BILITOT <0.2 07/10/2024 0801   BILITOT 0.2 06/29/2024 1112   GFRNONAA >60 07/10/2024 0801   GFRNONAA >60 06/29/2024 1112   GFRAA 82 07/14/2020 0945     Diabetic Labs (most recent): Lab Results  Component Value Date   HGBA1C 6.8 (H) 06/14/2024   HGBA1C 6.4 (H) 04/22/2024   HGBA1C 6.0 (H) 01/14/2024   MICROALBUR neg 07/13/2014     Lipid Panel ( most recent) Lipid Panel     Component Value Date/Time   CHOL 119 04/22/2024 0917   TRIG 186 (H) 04/22/2024 0917   TRIG 87 10/18/2016 0851   HDL 40 04/22/2024 0917   HDL 37 (L) 10/18/2016 0851   CHOLHDL 3.0 04/22/2024 0917   CHOLHDL 2.8 10/25/2023 0907   VLDL 32 10/25/2023 0907   LDLCALC 48 04/22/2024 0917   LDLCALC 53 07/13/2014 0925   LABVLDL 31 04/22/2024 0917      Lab Results  Component Value Date   TSH 0.106 (L) 06/14/2024   TSH 0.103 (L) 04/22/2024   TSH 0.294 (L) 03/06/2024   TSH 0.210 (L) 10/29/2023   TSH 0.199 (L) 10/25/2023   TSH 0.133 (L) 10/08/2023   TSH 0.384 (L) 06/07/2023   TSH 0.768 04/29/2023   TSH 0.468 04/15/2023   TSH 0.839 03/26/2023   FREET4 0.78 07/10/2024   FREET4 0.48 (L) 03/06/2024   FREET4 0.88 10/29/2023   FREET4 0.76 10/25/2023   FREET4 0.81 04/29/2023   FREET4 0.81 12/10/2022   FREET4 0.60 (L) 09/07/2022   FREET4 0.76 06/04/2022   FREET4 0.58 (L) 01/22/2022   FREET4 0.75 09/22/2021      Latest Reference Range & Units 01/22/22 08:18 01/22/22 08:20  Sex Horm Binding Glob, Serum 19.3 - 76.4 nmol/L  16.7 (L)  Testosterone  264 - 916 ng/dL  765 (L)  Testosterone  Free 6.6 - 18.1 pg/mL  4.7 (L)  Testosterone -% Free  0.2 - 0.7 %  1.9 (H)  TSH 0.350 - 4.500 uIU/mL 1.858   Triiodothyronine,Free,Serum 2.0 - 4.4 pg/mL 2.0   T4,Free(Direct) 0.61 - 1.12 ng/dL 9.41 (L)   (L): Data is abnormally low (H): Data is abnormally high  Assessment & Plan:   1. Hypopituitarism  2.  Hypogonadism 3.hypothyroidism 4.partial adrenal insufficiency -He is accompanied by his wife to clinic.  I reviewed and discussed his recent developments including hospitalization for sepsis and lab findings with him.   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 -He is advised to continue Decadron  1 mg p.o. daily at breakfast.  He is advised to wear medical alert.  He is also advised to taper his steroids during times of illness, surgical procedure and excessive stress. 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  His previsit thyroid  function tests are such that he will benefit from slightly increasing his levothyroxine .  I  discussed and prescribed levothyroxine  112 mcg p.o. daily before breakfast.      TSH may be unreliable in this patient, hence, we will use Free T4 exclusively for dose adjustment.     - We discussed about the correct intake of his thyroid  hormone, on empty stomach at fasting, with water , separated by at least 30 minutes from breakfast and other medications,  and separated by more than 4 hours from calcium , iron, multivitamins, acid reflux medications (PPIs). -Patient is made aware of the fact that thyroid  hormone replacement is needed for life, dose to be adjusted by periodic monitoring of thyroid  function tests.  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  His previsit labs show total testosterone  265 shortly after discharge from acute illness.  This is in acceptable range for him.  He is advised to continue  testosterone  50 mg and 100 mg intramuscularly alternating every week until next measurement.  This will give him a total testosterone  of 300 mg nightly.    -Treatment target for him will be between 250-350 ng per DL, with the aim being to help him with libido as well as possibly avoid further loss of skeletal muscle mass.  Regarding his type 2 diabetes, his recent A1c was 6%, recently off of Ozempic .  He will be kept off of diabetes medications for now.  The patient was counseled on the dangers of tobacco use, and was advised to quit.  Reviewed strategies to maximize success, including removing cigarettes and smoking materials from environment.   - he is advised to maintain close follow up with his oncologist, neurologist, radiologist and PCP [name], FNP for primary care needs.  I spent  26  minutes in the care of the patient today including review of labs from Thyroid  Function, CMP, and other relevant labs ; imaging/biopsy records (current and previous including abstractions from other facilities); face-to-face time discussing  his lab results and symptoms, medications doses, his options of short and long term treatment based on the latest standards of care / guidelines;   and documenting the encounter.  Bearl D Jeanbaptiste  participated in the discussions, expressed understanding, and voiced agreement with the above plans.  All questions were answered to his satisfaction. he is encouraged to contact clinic should he have any questions or concerns prior to his return visit.   Follow up plan: Return in about 6 months (around 01/13/2025) for Fasting Labs  in AM B4 8.   Ranny Earl, MD [facility] [address] [place], [place] [address] Phone: [phone]  Fax: [phone]     07/15/2024, 1:58 PM  This note was partially dictated with voice recognition software. Similar sounding words can be transcribed inadequately or may not  be  corrected upon review.

## 2024-07-15 NOTE — Progress Notes (Unsigned)
 Cardiology Office Note:  .   Date:  07/16/2024  ID:  Bradley Goodman, DOB April 21, 1956, MRN [id number] PCP: Bradley Goodman HERO, FNP  [facility] Providers Cardiologist:  Bradley SHAUNNA Maywood, MD Cardiology APP:  Bradley Lorette GRADE, PA-C {  History of Present Illness: .   Bradley Goodman is a 68 y.o. male  with PMHx of pericardial effusion (Echo 06/2024: moderate), stage IV NSCLC (dx 03/2021), adrenal insufficiency, hypothyroidism, depression, HTN, HLD who reports to [facility] office for follow up.   Bradley Goodman was last seen by heart care in 2018 by Bradley Goodman.  He has been lost to follow-up since that time. Stress echo in 2018 was normal without any signs of ischemia.   Recent hospitalization 9/6-06/2024. Admitted for Sepsis and found to have mild cellulitis in right calf, which was treated with abx. Cardiology consulted for pericardial effusion. Reported 4 hours of constant chest pain that resolved on its own that not affected by position changes. Also noted chronic unchanged SOB with moderate exertion. CTA with small pericardial effusion that increased in size since 11/11/2023.  ECHO 06/14/2024 showed EF 55 to 60%, moderate pericardial effusion that is circumferential, trivial MV regurgitation, trivial AV regurgitation, mild AV calcification. Limited Echo 06/16/2024 showed EF 60-65%, mod pericardial effusion, no tamponade; effusion is smaller. Elevated Crp 7 and ESR 74. Suspected most likely secondary to malignancy and radiation. Plans to repeat ESR/CRP in one month and if elevated then start colchicine/NSAIDS. Plans to repeat ECHO in 1 month. Continued to hold losartan  75 mg due to hypotension. Discharged on Crestor  20 mg daily.   Today, denies any chest pain, SOB, dizziness, LE edema, syncope. Reports compliance with medications. Notes home SBP has mainly been controlled 120-130's but was elevated yesterday at 150/88. Suspects BP elevated today due dealing with multiple stressors today prior to  appointment. Patient is able to golf 18 holes and gardening without any chest pain but intermittent occasional SOB with more moderate exertion, such as changing car tire, walking hills.  Endorses heart healthy diet with low sodium. Smoke 10-15 cigarettes per day. Drinks socially. Denies drug use.   ROS: 10 point review of system has been reviewed and considered negative except ones been listed in the HPI.   Studies Reviewed: Bradley Goodman   Stress ECHO 2018 Results reviewed. Please let him know that the stress test was reassuring, ECG was normal and echocardiogram did not suggest any ischemic territories. This is good news, if he continues to have any symptoms we can certainly see him back.   ECHO 06/14/2024 IMPRESSIONS   1. Left ventricular ejection fraction, by estimation, is 55 to 60%. The  left ventricle has normal function. The left ventricle has no regional wall motion abnormalities. Left ventricular diastolic parameters are  indeterminate.   2. Right ventricular systolic function is normal. The right ventricular size is normal. Tricuspid regurgitation signal is inadequate for assessing PA pressure.   3. Moderate pericardial effusion. The pericardial effusion is circumferential. No diagnostic respiratory variation in mitral or tricuspid inflow to suggest tamponade. There is mild, early diastolic  undulation in the RA and RV free wall - consider  followup imaging to reassess effusion size, particularly if clinical status worsens.   4. The mitral valve is grossly normal. Trivial mitral valve regurgitation.   5. The aortic valve is tricuspid. There is mild calcification of the aortic valve. Aortic valve regurgitation is trivial.   6. The inferior vena cava is normal in size with greater than 50%  respiratory variability, suggesting right atrial pressure of 3 mmHg.   Comparison(s): Prior images unable to be directly viewed.   ECHO 06/16/2024 IMPRESSIONS   1. Limited Echocardiogram to evaluate for pericardial  effusion.   2. Moderate pericardial effusion posterior to the left ventricle, left atrium and localized near the right atrium. No evidence of cardiac tamponade. Pericardial effusion anterior to the right ventricle is completely resolved compared to prior study. Prior echo images reviewed that showed moderate circumferential pericardial effusion.   3. Left ventricular ejection fraction, by estimation, is 60 to 65%. The left ventricle has normal function. The left ventricle has no regional wall motion abnormalities. There is mild left ventricular hypertrophy. Left ventricular diastolic function could not be evaluated.   4. Right ventricular systolic function is normal. The right ventricular size is normal. Tricuspid regurgitation signal is inadequate for assessing PA pressure.   5. The inferior vena cava is dilated in size with >50% respiratory variability, suggesting right atrial pressure of 8 mmHg.   Comparison(s): Changes from prior study are noted. As above.   Physical Exam:   VS:  BP 128/80 (BP Location: Right Arm, Patient Position: Sitting, Cuff Size: Normal)   Pulse (!) 101   Ht 5' 11.5 (1.816 m)   Wt 189 lb (85.7 kg)   SpO2 98%   BMI 25.99 kg/m    Wt Readings from Last 3 Encounters:  07/16/24 189 lb (85.7 kg)  07/15/24 188 lb 3.2 oz (85.4 kg)  06/29/24 189 lb 14.4 oz (86.1 kg)    GEN: Well nourished, well developed in no acute distress while sitting in chair. Accompanied by wife. NECK: No JVD; No carotid bruits CARDIAC: RRR, no murmurs, rubs, gallops RESPIRATORY:  Clear to auscultation without rales, wheezing or rhonchi  ABDOMEN: Soft, non-tender, non-distended EXTREMITIES:  No edema; No deformity   ASSESSMENT AND PLAN: .    Pericardial Effusion  CTA with small pericardial effusion that increased in size since 11/11/2023.  ECHO 06/14/2024 showed EF 55 to 60%, moderate pericardial effusion that is circumferential.  Limited Echo 06/16/2024 showed EF 60-65%, mod pericardial effusion,  no tamponade; effusion is smaller 06/2024 Elevated CRP 7 and ESR 74. Plans to repeat ESR/CRP in one month and if elevated then start colchicine/NSAIDS. Plans to repeat ECHO in 1 month. Order ESR/CRP and limited ECHO for pericardial effusion.  IF ESR/CRP remains elevated w/o chest pain, then would consider colchicine.  No signs of cardiac tamponade.  Discussed red flag symptoms for ED precautions including severe shortness of breath, syncope, hypotension. Suspect most likely secondary to malignancy and radiation.   Hypokalemia 06/2024: K 3>3.8 & MG 1.9.  Order BMP.    HTN  D/C Losartan  75 mg on 9/7 due to hypotension.  Notes home SBP has mainly been controlled 120-130's but was elevated yesterday at 150/88. Suspects BP elevated today due dealing with multiple stressors today prior to appointment.  BP this OV elevated 150/98 then repeat BP: 128/80 Discussed proper BP measurement technique.  BP log x 1 month.  Encourage physical activity for 150 minutes per week and heart healthy low sodium diet. Discussed limiting sodium intake to < 2 grams daily.      HLD, LDL goal < 100 LDL 48 in 04/2024 and LFT WNL in 07/2024 Continue Crestor  20 mg     Dispo: Follow up in 3 month with VM or Scottie, PA-C  Signed, Alphonza Tramell Y Adalind Weitz, PA-C

## 2024-07-16 ENCOUNTER — Other Ambulatory Visit: Payer: Self-pay

## 2024-07-16 ENCOUNTER — Other Ambulatory Visit (HOSPITAL_COMMUNITY)
Admission: RE | Admit: 2024-07-16 | Discharge: 2024-07-16 | Disposition: A | Discharge status: Home / Self Care | Source: Ambulatory Visit | Attending: Physician Assistant | Admitting: Physician Assistant

## 2024-07-16 ENCOUNTER — Other Ambulatory Visit (HOSPITAL_COMMUNITY): Payer: Self-pay

## 2024-07-16 ENCOUNTER — Ambulatory Visit: Attending: Student | Admitting: Physician Assistant

## 2024-07-16 ENCOUNTER — Encounter: Payer: Self-pay | Admitting: Physician Assistant

## 2024-07-16 ENCOUNTER — Other Ambulatory Visit: Payer: Self-pay | Admitting: Family Medicine

## 2024-07-16 VITALS — BP 128/80 | HR 101 | Ht 71.5 in | Wt 189.0 lb

## 2024-07-16 DIAGNOSIS — I152 Hypertension secondary to endocrine disorders: Secondary | ICD-10-CM | POA: Diagnosis not present

## 2024-07-16 DIAGNOSIS — I3139 Other pericardial effusion (noninflammatory): Secondary | ICD-10-CM | POA: Insufficient documentation

## 2024-07-16 DIAGNOSIS — E1169 Type 2 diabetes mellitus with other specified complication: Secondary | ICD-10-CM

## 2024-07-16 DIAGNOSIS — R630 Anorexia: Secondary | ICD-10-CM

## 2024-07-16 DIAGNOSIS — E1159 Type 2 diabetes mellitus with other circulatory complications: Secondary | ICD-10-CM

## 2024-07-16 DIAGNOSIS — E876 Hypokalemia: Secondary | ICD-10-CM

## 2024-07-16 DIAGNOSIS — K219 Gastro-esophageal reflux disease without esophagitis: Secondary | ICD-10-CM

## 2024-07-16 DIAGNOSIS — E785 Hyperlipidemia, unspecified: Secondary | ICD-10-CM | POA: Diagnosis not present

## 2024-07-16 LAB — SEDIMENTATION RATE: Sed Rate: 24 mm/h — ABNORMAL HIGH (ref 0–20)

## 2024-07-16 LAB — C-REACTIVE PROTEIN: CRP: 1.9 mg/dL — ABNORMAL HIGH (ref <1.0)

## 2024-07-16 MED ORDER — ROSUVASTATIN CALCIUM 20 MG PO TABS
20.0000 mg | ORAL_TABLET | Freq: Every day | ORAL | 0 refills | Status: DC
  Filled 2024-07-16: qty 90, 90d supply, fill #0

## 2024-07-16 MED ORDER — FOLIC ACID 1 MG PO TABS
1.0000 mg | ORAL_TABLET | Freq: Every day | ORAL | 0 refills | Status: DC
  Filled 2024-07-16: qty 90, 90d supply, fill #0

## 2024-07-16 MED ORDER — PANTOPRAZOLE SODIUM 40 MG PO TBEC
40.0000 mg | DELAYED_RELEASE_TABLET | Freq: Every evening | ORAL | 0 refills | Status: DC
  Filled 2024-07-16: qty 90, 90d supply, fill #0

## 2024-07-16 NOTE — Patient Instructions (Addendum)
 Medication Instructions:  Your physician recommends that you continue on your current medications as directed. Please refer to the Current Medication list given to you today.  Monitor Blood Pressure for 1 month and record readings.   *If you need a refill on your cardiac medications before your next appointment, please call your pharmacy*  Lab Work: Your physician recommends that you return for lab work in: Today ( ESR, CRP)   If you have labs (blood work) drawn today and your tests are completely normal, you will receive your results only by: MyChart Message (if you have MyChart) OR A paper copy in the mail If you have any lab test that is abnormal or we need to change your treatment, we will call you to review the results.  Testing/Procedures: Your physician has requested that you have an echocardiogram. Echocardiography is a painless test that uses sound waves to create images of your heart. It provides your doctor with information about the size and shape of your heart and how well your heart's chambers and valves are working. This procedure takes approximately one hour. There are no restrictions for this procedure. Please do NOT wear cologne, perfume, aftershave, or lotions (deodorant is allowed). Please arrive 15 minutes prior to your appointment time.  Please note: We ask at that you not bring children with you during ultrasound (echo/ vascular) testing. Due to room size and safety concerns, children are not allowed in the ultrasound rooms during exams. Our front office staff cannot provide observation of children in our lobby area while testing is being conducted. An adult accompanying a patient to their appointment will only be allowed in the ultrasound room at the discretion of the ultrasound technician under special circumstances. We apologize for any inconvenience.   Follow-Up: At [facility], you and your health needs are our priority.  As part of our continuing mission  to provide you with exceptional heart care, our providers are all part of one team.  This team includes your primary Cardiologist (physician) and Advanced Practice Providers or APPs (Physician Assistants and Nurse Practitioners) who all work together to provide you with the care you need, when you need it.  Your next appointment:   3 month(s)  Provider:   Vishnu Mallipeddi, MD or Lorette Kapur, PA-C     We recommend signing up for the patient portal called MyChart.  Sign up information is provided on this After Visit Summary.  MyChart is used to connect with patients for Virtual Visits (Telemedicine).  Patients are able to view lab/test results, encounter notes, upcoming appointments, etc.  Non-urgent messages can be sent to your provider as well.   To learn more about what you can do with MyChart, go to [email or web address].   Other Instructions Thank you for choosing [facility]!

## 2024-07-20 ENCOUNTER — Other Ambulatory Visit: Payer: Self-pay | Admitting: Medical Oncology

## 2024-07-20 ENCOUNTER — Inpatient Hospital Stay

## 2024-07-20 ENCOUNTER — Inpatient Hospital Stay: Attending: Physician Assistant

## 2024-07-20 ENCOUNTER — Inpatient Hospital Stay: Admitting: Internal Medicine

## 2024-07-20 VITALS — HR 89

## 2024-07-20 VITALS — BP 134/70 | HR 100 | Temp 97.8°F | Resp 17 | Ht 71.5 in | Wt 192.3 lb

## 2024-07-20 DIAGNOSIS — R530 Neoplastic (malignant) related fatigue: Secondary | ICD-10-CM | POA: Diagnosis not present

## 2024-07-20 DIAGNOSIS — F17210 Nicotine dependence, cigarettes, uncomplicated: Secondary | ICD-10-CM | POA: Diagnosis not present

## 2024-07-20 DIAGNOSIS — T451X5A Adverse effect of antineoplastic and immunosuppressive drugs, initial encounter: Secondary | ICD-10-CM | POA: Diagnosis not present

## 2024-07-20 DIAGNOSIS — D6481 Anemia due to antineoplastic chemotherapy: Secondary | ICD-10-CM | POA: Insufficient documentation

## 2024-07-20 DIAGNOSIS — C7931 Secondary malignant neoplasm of brain: Secondary | ICD-10-CM | POA: Diagnosis not present

## 2024-07-20 DIAGNOSIS — C3411 Malignant neoplasm of upper lobe, right bronchus or lung: Secondary | ICD-10-CM | POA: Diagnosis not present

## 2024-07-20 DIAGNOSIS — C7951 Secondary malignant neoplasm of bone: Secondary | ICD-10-CM | POA: Insufficient documentation

## 2024-07-20 DIAGNOSIS — Z5112 Encounter for antineoplastic immunotherapy: Secondary | ICD-10-CM | POA: Diagnosis not present

## 2024-07-20 DIAGNOSIS — R5383 Other fatigue: Secondary | ICD-10-CM | POA: Insufficient documentation

## 2024-07-20 DIAGNOSIS — C3491 Malignant neoplasm of unspecified part of right bronchus or lung: Secondary | ICD-10-CM

## 2024-07-20 DIAGNOSIS — Z5111 Encounter for antineoplastic chemotherapy: Secondary | ICD-10-CM | POA: Diagnosis present

## 2024-07-20 LAB — CMP (CANCER CENTER ONLY)
ALT: 15 U/L (ref 0–44)
AST: 17 U/L (ref 15–41)
Albumin: 3.6 g/dL (ref 3.5–5.0)
Alkaline Phosphatase: 55 U/L (ref 38–126)
Anion gap: 7 (ref 5–15)
BUN: 12 mg/dL (ref 8–23)
CO2: 28 mmol/L (ref 22–32)
Calcium: 8.9 mg/dL (ref 8.9–10.3)
Chloride: 106 mmol/L (ref 98–111)
Creatinine: 1.04 mg/dL (ref 0.61–1.24)
GFR, Estimated: >60 mL/min (ref >60)
Glucose, Bld: 150 mg/dL — ABNORMAL HIGH (ref 70–99)
Potassium: 3.7 mmol/L (ref 3.5–5.1)
Sodium: 141 mmol/L (ref 135–145)
Total Bilirubin: 0.3 mg/dL (ref 0.0–1.2)
Total Protein: 6.4 g/dL — ABNORMAL LOW (ref 6.5–8.1)

## 2024-07-20 LAB — CBC WITH DIFFERENTIAL (CANCER CENTER ONLY)
Abs Immature Granulocytes: 0.06 K/uL (ref 0.00–0.07)
Basophils Absolute: 0.1 K/uL (ref 0.0–0.1)
Basophils Relative: 1 %
Eosinophils Absolute: 0 K/uL (ref 0.0–0.5)
Eosinophils Relative: 0 %
HCT: 35.9 % — ABNORMAL LOW (ref 39.0–52.0)
Hemoglobin: 10.4 g/dL — ABNORMAL LOW (ref 13.0–17.0)
Immature Granulocytes: 1 %
Lymphocytes Relative: 10 %
Lymphs Abs: 1.2 K/uL (ref 0.7–4.0)
MCH: 24.2 pg — ABNORMAL LOW (ref 26.0–34.0)
MCHC: 29 g/dL — ABNORMAL LOW (ref 30.0–36.0)
MCV: 83.7 fL (ref 80.0–100.0)
Monocytes Absolute: 0.6 K/uL (ref 0.1–1.0)
Monocytes Relative: 5 %
Neutro Abs: 9.8 K/uL — ABNORMAL HIGH (ref 1.7–7.7)
Neutrophils Relative %: 83 %
Platelet Count: 353 K/uL (ref 150–400)
RBC: 4.29 MIL/uL (ref 4.22–5.81)
RDW: 20.7 % — ABNORMAL HIGH (ref 11.5–15.5)
WBC Count: 11.8 K/uL — ABNORMAL HIGH (ref 4.0–10.5)
nRBC: 0 % (ref 0.0–0.2)

## 2024-07-20 MED ORDER — SODIUM CHLORIDE 0.9 % IV SOLN
200.0000 mg | Freq: Once | INTRAVENOUS | Status: AC
  Administered 2024-07-20: 200 mg via INTRAVENOUS
  Filled 2024-07-20: qty 200

## 2024-07-20 MED ORDER — PROCHLORPERAZINE MALEATE 10 MG PO TABS
10.0000 mg | ORAL_TABLET | Freq: Once | ORAL | Status: AC
  Administered 2024-07-20: 10 mg via ORAL
  Filled 2024-07-20: qty 1

## 2024-07-20 MED ORDER — SODIUM CHLORIDE 0.9 % IV SOLN: Freq: Once | INTRAVENOUS | Status: AC

## 2024-07-20 MED ORDER — SODIUM CHLORIDE 0.9 % IV SOLN
500.0000 mg/m2 | Freq: Once | INTRAVENOUS | Status: AC
  Administered 2024-07-20: 1000 mg via INTRAVENOUS
  Filled 2024-07-20: qty 40

## 2024-07-20 NOTE — Progress Notes (Signed)
 Lab orders entered

## 2024-07-20 NOTE — Progress Notes (Signed)
 [facility] [phone]   [phone]  OFFICE PROGRESS NOTE  Bradley [name], FNP [address] [place] [address]  DIAGNOSIS: Stage IV (T1b, N3, M1C) non-small cell lung cancer, favoring adenocarcinoma presented with right upper lobe lung nodule in addition to right hilar, subcarinal and bilateral mediastinal as well as supraclavicular lymphadenopathy in addition to bone and brain metastasis diagnosed in June 2022.     PD-L1 expression 80%.     Molecular Studies:  Biomarker Findings Microsatellite status - MS-Stable Tumor Mutational Burden - 6 Muts/Mb Genomic Findings For a complete list of the genes assayed, please refer to the Appendix. KRAS G12C, amplification ATM S470* CCND1 amplification - equivocal? HGF amplification - equivocal? MYC amplification - equivocal? FGF19 amplification - equivocal? FGF3 amplification - equivocal? FGF4 amplification - equivocal? NFKBIA amplification NKX2-1 amplification RAD21 amplification - equivocal? [id number]*26 TERT promoter -124C>T TP53 rearrangement exon 9 7 Disease relevant genes with no reportable alterations: ALK, BRAF, EGFR, ERBB2, MET, RET, ROS1   PRIOR THERAPY: SRS to the metastatic brain lesions under the care of Dr. Patrcia.  Last treatment on 05/04/2021.   CURRENT THERAPY: Palliative systemic chemotherapy with carboplatin  for an AUC 5, Alimta  500 mg/m2 and, Keytruda  200 mg IV every 3 weeks.  First dose on 05/08/2021.  Status post 54 cycles.  Starting from cycle #5 he is on maintenance treatment with Alimta  and Keytruda  every 3 weeks.  INTERVAL HISTORY: Bradley Goodman 68 y.o. male returns to the clinic today for follow-up visit accompanied by his wife.Discussed the use of AI scribe software for clinical note transcription with the patient, who gave verbal consent to proceed.  History of Present Illness Bradley Goodman is a 68 year old male with stage four non-small cell lung  cancer who presents for evaluation before starting cycle number fifty five of maintenance treatment. He is accompanied by his wife.  He has stage four non-small cell lung cancer with a positive KRAS G12C mutation. He underwent stereotactic radiosurgery for brain metastasis and subsequently started palliative systemic chemo-immunotherapy. Initially, he received carboplatin , pemetrexed , and pembrolizumab  every three weeks for four cycles. Since cycle five, he has been on maintenance treatment with pemetrexed  and pembrolizumab  every three weeks, and he is now preparing for cycle number fifty five.  He feels 'tired, tired, tired' and has experienced a fall, which his wife confirms. Despite these symptoms, he states that he is 'going good' and has no complaints of nausea, vomiting, diarrhea, or headaches. He continues to engage in physical activities such as playing golf, which he did last Friday.    MEDICAL HISTORY: Past Medical History:  Diagnosis Date   Brain cancer (HCC)    Cervical spondylolysis    Depression    Essential hypertension    GERD (gastroesophageal reflux disease)    History of kidney stones    History of migraine    Hyperlipidemia    Hypertension    lung ca with mets to brain    Lung cancer (HCC)    PONV (postoperative nausea and vomiting)    Type 2 diabetes mellitus (HCC)     ALLERGIES:  has no known allergies.  MEDICATIONS:  Current Outpatient Medications  Medication Sig Dispense Refill   acetaminophen  (TYLENOL ) 500 MG tablet Take 500 mg by mouth every 6 (six) hours as needed for mild pain (pain score 1-3) or fever.     chlorproMAZINE  (THORAZINE ) 10 MG tablet Take 1 tablet (10 mg total) by mouth  3 (three) times daily as needed for hiccoughs. 90 tablet 0   cholecalciferol  (VITAMIN D ) 1000 UNITS tablet Take 1,000 Units by mouth every evening.     Continuous Glucose Sensor (FREESTYLE LIBRE 3 PLUS SENSOR) MISC Change sensor every 15 days. 1 each 11   dexamethasone   (DECADRON ) 1 MG tablet Take 1 tablet (1 mg total) by mouth daily with breakfast. 90 tablet 1   famotidine  (PEPCID ) 10 MG tablet Take 10 mg by mouth at bedtime. Chewable     folic acid  (FOLVITE ) 1 MG tablet Take 1 tablet (1 mg total) by mouth daily. 90 tablet 0   Krill Oil 300 MG CAPS Take 300 mg by mouth every evening.     levothyroxine  (SYNTHROID ) 112 MCG tablet Take 1 tablet (112 mcg total) by mouth daily before breakfast. 90 tablet 1   loratadine  (CLARITIN ) 10 MG tablet Take 10 mg by mouth every evening.      mirtazapine  (REMERON ) 15 MG tablet Take 1 tablet (15 mg total) by mouth at bedtime. 90 tablet 1   Multiple Vitamins-Minerals (ONE A DAY MEN 50 PLUS) TABS Take 1 tablet by mouth at bedtime.     ondansetron  (ZOFRAN ) 4 MG tablet Take 1 tablet (4 mg total) by mouth every 6 (six) hours as needed for nausea. 20 tablet 0   pantoprazole  (PROTONIX ) 40 MG tablet Take 1 tablet (40 mg total) by mouth every evening. 90 tablet 0   polyethylene glycol powder (GLYCOLAX /MIRALAX ) 17 GM/SCOOP powder Take 119 g by mouth daily as needed for mild constipation or moderate constipation.     rosuvastatin  (CRESTOR ) 20 MG tablet Take 1 tablet (20 mg total) by mouth daily. 90 tablet 0   SYRINGE-NEEDLE, DISP, 3 ML (B-D 3CC LUER-LOK SYR 21GX1-1/2) 21G X 1-1/2 3 ML MISC Use to inject testosterone  every week 100 each 2   testosterone  cypionate (DEPOTESTOTERONE CYPIONATE) 100 MG/ML injection Take 50mg  ( 0.73ml) and 100mg  ( 1ml) into muscle alternatively weekly. 10 mL 1   venlafaxine  XR (EFFEXOR -XR) 75 MG 24 hr capsule Take 2 capsules (150 mg total) by mouth daily with breakfast. (Patient taking differently: Take 150 mg by mouth at bedtime.) 180 capsule 2   No current facility-administered medications for this visit.    SURGICAL HISTORY:  Past Surgical History:  Procedure Laterality Date   Bilateral inguinal hernia repair     BRONCHIAL NEEDLE ASPIRATION BIOPSY  04/05/2021   Procedure: BRONCHIAL NEEDLE ASPIRATION  BIOPSIES;  Surgeon: Brenna Adine CROME, DO;  Location: [facility];  Service: Pulmonary;;   COLONOSCOPY  01/23/2012   Procedure: COLONOSCOPY;  Surgeon: Lamar CHRISTELLA Hollingshead, MD;  Location: [facility];  Service: Endoscopy;  Laterality: N/A;  9:30 AM   COLONOSCOPY N/A 10/11/2015   Procedure: COLONOSCOPY;  Surgeon: Lamar CHRISTELLA Hollingshead, MD;  Location: [facility];  Service: Endoscopy;  Laterality: N/A;  830   COLONOSCOPY N/A 10/14/2019   Procedure: COLONOSCOPY;  Surgeon: Hollingshead Lamar CHRISTELLA, MD;  Location: [facility];  Service: Endoscopy;  Laterality: N/A;  1:45   HERNIA REPAIR     POLYPECTOMY  10/14/2019   Procedure: POLYPECTOMY;  Surgeon: Hollingshead Lamar CHRISTELLA, MD;  Location: [facility];  Service: Endoscopy;;  ascending colon, descending colon   VIDEO BRONCHOSCOPY WITH ENDOBRONCHIAL ULTRASOUND N/A 04/05/2021   Procedure: VIDEO BRONCHOSCOPY WITH ENDOBRONCHIAL ULTRASOUND;  Surgeon: Brenna Adine CROME, DO;  Location: [facility];  Service: Pulmonary;  Laterality: N/A;    REVIEW OF SYSTEMS:  A comprehensive review of systems was negative except  for: Constitutional: positive for fatigue   PHYSICAL EXAMINATION: General appearance: alert, cooperative, appears stated age, fatigued, and no distress Head: Normocephalic, without obvious abnormality, atraumatic Neck: no adenopathy, no JVD, supple, symmetrical, trachea midline, and thyroid  not enlarged, symmetric, no tenderness/mass/nodules Lymph nodes: Cervical, supraclavicular, and axillary nodes normal. Resp: clear to auscultation bilaterally Back: symmetric, no curvature. ROM normal. No CVA tenderness. Cardio: regular rate and rhythm, S1, S2 normal, no murmur, click, rub or gallop GI: soft, non-tender; bowel sounds normal; no masses,  no organomegaly Extremities: extremities normal, atraumatic, no cyanosis or edema  ECOG PERFORMANCE STATUS: 1 - Symptomatic but completely ambulatory  Blood pressure 134/70, pulse 100, temperature 97.8 F (36.6 C), resp. rate 17,  height 5' 11.5 (1.816 m), weight 192 lb 4.8 oz (87.2 kg), SpO2 97%.  LABORATORY DATA: Lab Results  Component Value Date   WBC 11.8 (H) 07/20/2024   HGB 10.4 (L) 07/20/2024   HCT 35.9 (L) 07/20/2024   MCV 83.7 07/20/2024   PLT 353 07/20/2024      Chemistry      Component Value Date/Time   NA 142 07/10/2024 0801   NA 145 (H) 04/22/2024 0917   K 3.9 07/10/2024 0801   CL 103 07/10/2024 0801   CO2 27 07/10/2024 0801   BUN 19 07/10/2024 0801   BUN 10 04/22/2024 0917   CREATININE 0.95 07/10/2024 0801   CREATININE 0.96 06/29/2024 1112      Component Value Date/Time   CALCIUM  10.6 (H) 07/10/2024 0801   ALKPHOS 73 07/10/2024 0801   AST 27 07/10/2024 0801   AST 19 06/29/2024 1112   ALT 29 07/10/2024 0801   ALT 19 06/29/2024 1112   BILITOT <0.2 07/10/2024 0801   BILITOT 0.2 06/29/2024 1112       RADIOGRAPHIC STUDIES: No results found.     ASSESSMENT AND PLAN: This is a very pleasant 68 years old white male recently diagnosed with stage IV (T1b, N3, M1 C) non-small cell lung cancer favoring adenocarcinoma presented with right upper lobe lung nodule in addition to right hilar, subcarinal and bilateral mediastinal as well as supraclavicular lymphadenopathy.  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  His PD-L1 expression is 80% and his molecular studies showed KRAS G12C mutation. The patient underwent SRS to metastatic brain lesion under the care of Dr. Patrcia and he is currently undergoing systemic chemotherapy with carboplatin  for AUC of 5, Alimta  500 Mg/M2 and Keytruda  200 Mg IV every 3 weeks status post 54 cycles.  Starting from cycle #5 the patient will be treated with maintenance treatment with Alimta  and Keytruda  every 3 weeks.  The patient continues to tolerate this treatment fairly well. Assessment and Plan Assessment & Plan Stage IV non-small cell lung cancer with brain metastasis Currently on maintenance therapy with Alimta  and Keytruda ,  cycle number 55. No new complaints of nausea, vomiting, diarrhea, or headaches. Breathing is well-managed. Continues to experience fatigue. Blood counts are stable, with persistent anemia but no significant changes. - Continue maintenance therapy with Alimta  and Keytruda  every three weeks.  Anemia secondary to chemotherapy Blood counts remain stable despite persistent anemia. No acute changes or concerns.  Cancer-related fatigue Experiencing ongoing cancer-related fatigue, a common side effect of his treatment regimen. No new interventions discussed. The patient was advised to call immediately if he has any concerning symptoms in the interval.  The patient voices understanding of current disease status and treatment options and is in agreement with the current care plan.  All questions were answered. The patient knows to call the clinic with any problems, questions or concerns. We can certainly see the patient much sooner if necessary. The total time spent in the appointment was 20 minutes.  Disclaimer: This note was dictated with voice recognition software. Similar sounding words can inadvertently be transcribed and may not be corrected upon review.

## 2024-07-20 NOTE — Patient Instructions (Signed)
 [facility] - A DEPT OF [facility]  Discharge Instructions: Thank you for choosing [facility] to provide your oncology and hematology care.   If you have a lab appointment with the Cancer Center, please go directly to the Cancer Center and check in at the registration area.   Wear comfortable clothing and clothing appropriate for easy access to any Portacath or PICC line.   We strive to give you quality time with your provider. You may need to reschedule your appointment if you arrive late (15 or more minutes).  Arriving late affects you and other patients whose appointments are after yours.  Also, if you miss three or more appointments without notifying the office, you may be dismissed from the clinic at the provider's discretion.      For prescription refill requests, have your pharmacy contact our office and allow 72 hours for refills to be completed.    Today you received the following chemotherapy and/or immunotherapy agents: Pembrolizumab  (Keytruda ), Alimta  (Pemetrexed )       To help prevent nausea and vomiting after your treatment, we encourage you to take your nausea medication as directed.  BELOW ARE SYMPTOMS THAT SHOULD BE REPORTED IMMEDIATELY: *FEVER GREATER THAN 100.4 F (38 C) OR HIGHER *CHILLS OR SWEATING *NAUSEA AND VOMITING THAT IS NOT CONTROLLED WITH YOUR NAUSEA MEDICATION *UNUSUAL SHORTNESS OF BREATH *UNUSUAL BRUISING OR BLEEDING *URINARY PROBLEMS (pain or burning when urinating, or frequent urination) *BOWEL PROBLEMS (unusual diarrhea, constipation, pain near the anus) TENDERNESS IN MOUTH AND THROAT WITH OR WITHOUT PRESENCE OF ULCERS (sore throat, sores in mouth, or a toothache) UNUSUAL RASH, SWELLING OR PAIN  UNUSUAL VAGINAL DISCHARGE OR ITCHING   Items with * indicate a potential emergency and should be followed up as soon as possible or go to the Emergency Department if any problems should occur.  Please show the  CHEMOTHERAPY ALERT CARD or IMMUNOTHERAPY ALERT CARD at check-in to the Emergency Department and triage nurse.  Should you have questions after your visit or need to cancel or reschedule your appointment, please contact [facility] - A DEPT OF [facility]  Dept: [phone]  and follow the prompts.  Office hours are 8:00 a.m. to 4:30 p.m. Monday - Friday. Please note that voicemails left after 4:00 p.m. may not be returned until the following business day.  We are closed weekends and major holidays. You have access to a nurse at all times for urgent questions. Please call the main number to the clinic Dept: [phone] and follow the prompts.   For any non-urgent questions, you may also contact your provider using MyChart. We now offer e-Visits for anyone 68 and older to request care online for non-urgent symptoms. For details visit [email or web address].   Also download the MyChart app! Go to the app store, search MyChart, open the app, select [place], and log in with your MyChart username and password.

## 2024-07-21 ENCOUNTER — Encounter: Payer: Self-pay | Admitting: Internal Medicine

## 2024-07-21 ENCOUNTER — Other Ambulatory Visit (HOSPITAL_COMMUNITY): Payer: Self-pay

## 2024-07-22 ENCOUNTER — Ambulatory Visit: Payer: Self-pay | Admitting: Physician Assistant

## 2024-07-22 ENCOUNTER — Other Ambulatory Visit: Payer: Self-pay

## 2024-07-22 ENCOUNTER — Telehealth (HOSPITAL_COMMUNITY): Payer: Self-pay

## 2024-07-22 ENCOUNTER — Encounter: Payer: Self-pay | Admitting: Internal Medicine

## 2024-07-22 ENCOUNTER — Telehealth: Payer: Self-pay

## 2024-07-22 ENCOUNTER — Other Ambulatory Visit (HOSPITAL_COMMUNITY): Payer: Self-pay

## 2024-07-22 NOTE — Telephone Encounter (Signed)
 Pharmacy Patient Advocate Encounter   Received notification from Pt Calls Messages that prior authorization for testosterone  cyp 100mg /ml is required/requested.   Insurance verification completed.   The patient is insured through [facility].   Per test claim: PA required; PA submitted to above mentioned insurance via Latent Key/confirmation #/[id number]	[place] Status is pending

## 2024-07-23 ENCOUNTER — Other Ambulatory Visit (HOSPITAL_BASED_OUTPATIENT_CLINIC_OR_DEPARTMENT_OTHER): Payer: Self-pay

## 2024-07-23 ENCOUNTER — Encounter: Payer: Self-pay | Admitting: Family Medicine

## 2024-07-23 ENCOUNTER — Other Ambulatory Visit: Payer: Self-pay

## 2024-07-23 ENCOUNTER — Ambulatory Visit (INDEPENDENT_AMBULATORY_CARE_PROVIDER_SITE_OTHER): Admitting: Family Medicine

## 2024-07-23 VITALS — BP 128/80 | HR 118 | Temp 96.8°F | Ht 71.5 in | Wt 193.8 lb

## 2024-07-23 DIAGNOSIS — E1165 Type 2 diabetes mellitus with hyperglycemia: Secondary | ICD-10-CM

## 2024-07-23 DIAGNOSIS — E1169 Type 2 diabetes mellitus with other specified complication: Secondary | ICD-10-CM

## 2024-07-23 DIAGNOSIS — R066 Hiccough: Secondary | ICD-10-CM | POA: Diagnosis not present

## 2024-07-23 DIAGNOSIS — R110 Nausea: Secondary | ICD-10-CM

## 2024-07-23 DIAGNOSIS — E1159 Type 2 diabetes mellitus with other circulatory complications: Secondary | ICD-10-CM | POA: Diagnosis not present

## 2024-07-23 DIAGNOSIS — E785 Hyperlipidemia, unspecified: Secondary | ICD-10-CM | POA: Diagnosis not present

## 2024-07-23 DIAGNOSIS — E8809 Other disorders of plasma-protein metabolism, not elsewhere classified: Secondary | ICD-10-CM | POA: Diagnosis not present

## 2024-07-23 DIAGNOSIS — I152 Hypertension secondary to endocrine disorders: Secondary | ICD-10-CM

## 2024-07-23 DIAGNOSIS — E2740 Unspecified adrenocortical insufficiency: Secondary | ICD-10-CM

## 2024-07-23 DIAGNOSIS — C3491 Malignant neoplasm of unspecified part of right bronchus or lung: Secondary | ICD-10-CM

## 2024-07-23 DIAGNOSIS — C7931 Secondary malignant neoplasm of brain: Secondary | ICD-10-CM | POA: Diagnosis not present

## 2024-07-23 DIAGNOSIS — E46 Unspecified protein-calorie malnutrition: Secondary | ICD-10-CM

## 2024-07-23 MED ORDER — ONDANSETRON HCL 4 MG PO TABS
8.0000 mg | ORAL_TABLET | Freq: Three times a day (TID) | ORAL | 0 refills | Status: AC | PRN
  Filled 2024-07-23: qty 60, 10d supply, fill #0

## 2024-07-23 NOTE — Patient Instructions (Signed)

## 2024-07-23 NOTE — Progress Notes (Signed)
 Subjective:  Patient ID: Bradley Goodman, male    DOB: Jun 08, 1956, 68 y.o.   MRN: [id number]  Patient Care Team: [name], FNP as PCP - General (Family Medicine) Stacia Diannah SQUIBB, MD as PCP - Cardiology (Cardiology) Shaaron Lamar HERO, MD as Consulting Physician (Gastroenterology) Vicci Mcardle, OD (Optometry) Sheron Lorette CINDERELLA DEVONNA as Physician Assistant (Cardiology)   Chief Complaint:  Medical Management of Chronic Issues (3 month follow up )   HPI: Bradley Goodman is a 68 y.o. male presenting on 07/23/2024 for Medical Management of Chronic Issues (3 month follow up )   Bradley Goodman is a 68 year old male with diabetes who presents with concerns about blood sugar fluctuations and nausea.  He recently underwent chemotherapy and has felt unwell since. His blood work showed low protein levels. He is trying to increase his protein intake using protein drinks like Premier Protein and products from [employer].  He experiences significant fluctuations in blood sugar levels, with a recent episode where his blood sugar was low. Frequent low blood sugar episodes occur, with a current reading of 97 mg/dL. He manages low blood sugar with protein drinks and peanut butter crackers.  His blood pressure has been variable, with a recent low reading at the cardiologist's office, but it normalized to 128/80 mmHg. No symptoms of weakness or dizziness related to blood pressure are reported, but these symptoms are associated with low blood sugar levels.  He experiences frequent nausea, described as brief and lasting only a minute, with no vomiting. He has not been taking Zofran  for nausea. He also reports worsening hiccups over the past few days, for which he has Thorazine  but has only taken it once.  Recent lab work showed a slight increase in white blood cell count to 11.8, stable hemoglobin and hematocrit, and elevated sedimentation rate. Recent labs showed low protein and albumin levels. He is  currently on chemotherapy and takes Decadron  daily. His Synthroid  dose was recently increased to 112 mcg.          Relevant past medical, surgical, family, and social history reviewed and updated as indicated.  Allergies and medications reviewed and updated. Data reviewed: Chart in Epic.   Past Medical History:  Diagnosis Date   Brain cancer (HCC)    Cervical spondylolysis    Depression    Essential hypertension    GERD (gastroesophageal reflux disease)    History of kidney stones    History of migraine    Hyperlipidemia    Hypertension    lung ca with mets to brain    Lung cancer (HCC)    PONV (postoperative nausea and vomiting)    Type 2 diabetes mellitus (HCC)     Past Surgical History:  Procedure Laterality Date   Bilateral inguinal hernia repair     BRONCHIAL NEEDLE ASPIRATION BIOPSY  04/05/2021   Procedure: BRONCHIAL NEEDLE ASPIRATION BIOPSIES;  Surgeon: Brenna Adine CROME, DO;  [facility]: [facility];  Service: Pulmonary;;   COLONOSCOPY  01/23/2012   Procedure: COLONOSCOPY;  Surgeon: Lamar HERO Shaaron, MD;  Location: [facility];  Service: Endoscopy;  Laterality: N/A;  9:30 AM   COLONOSCOPY N/A 10/11/2015   Procedure: COLONOSCOPY;  Surgeon: Lamar HERO Shaaron, MD;  Location: [facility];  Service: Endoscopy;  Laterality: N/A;  830   COLONOSCOPY N/A 10/14/2019   Procedure: COLONOSCOPY;  Surgeon: Shaaron Lamar HERO, MD;  Location: [facility];  Service: Endoscopy;  Laterality: N/A;  1:45  HERNIA REPAIR     POLYPECTOMY  10/14/2019   Procedure: POLYPECTOMY;  Surgeon: Shaaron Lamar HERO, MD;  Location: [facility];  Service: Endoscopy;;  ascending colon, descending colon   VIDEO BRONCHOSCOPY WITH ENDOBRONCHIAL ULTRASOUND N/A 04/05/2021   Procedure: VIDEO BRONCHOSCOPY WITH ENDOBRONCHIAL ULTRASOUND;  Surgeon: Brenna Adine CROME, DO;  Location: [facility];  Service: Pulmonary;  Laterality: N/A;    Social History   Socioeconomic History   Marital status: Married    Spouse  name: Not on file   Number of children: Not on file   Years of education: Not on file   Highest education level: Bachelor's degree (e.g., BA, AB, BS)  Occupational History   Not on file  Tobacco Use   Smoking status: Every Day    Current packs/day: 0.50    Average packs/day: 0.5 packs/day for 50.7 years (25.4 ttl pk-yrs)    Types: Cigarettes    Start date: 10/30/1973   Smokeless tobacco: Never  Vaping Use   Vaping status: Never Used  Substance and Sexual Activity   Alcohol use: Not Currently    Comment: One drink every 6 months.   Drug use: No   Sexual activity: Yes  Other Topics Concern   Not on file  Social History Narrative   Not on file   Social Drivers of Health   Financial Resource Strain: Low Risk  (07/22/2024)   Overall Financial Resource Strain (CARDIA)    Difficulty of Paying Living Expenses: Not very hard  Food Insecurity: No Food Insecurity (07/22/2024)   Hunger Vital Sign    Worried About Running Out of Food in the Last Year: Never true    Ran Out of Food in the Last Year: Never true  Transportation Needs: No Transportation Needs (07/22/2024)   PRAPARE - Administrator, Civil Service (Medical): No    Lack of Transportation (Non-Medical): No  Physical Activity: Unknown (04/19/2024)   Exercise Vital Sign    Days of Exercise per Week: 1 day    Minutes of Exercise per Session: Patient declined  Stress: No Stress Concern Present (04/19/2024)   [employer] of Occupational Health - Occupational Stress Questionnaire    Feeling of Stress: Not at all  Social Connections: Unknown (07/22/2024)   Social Connection and Isolation Panel    Frequency of Communication with Friends and Family: Patient declined    Frequency of Social Gatherings with Friends and Family: Patient declined    Attends Religious Services: Patient declined    Database administrator or Organizations: Patient declined    Attends Banker Meetings: Not on file    Marital  Status: Married  Intimate Partner Violence: Not At Risk (06/13/2024)   Humiliation, Afraid, Rape, and Kick questionnaire    Fear of Current or Ex-Partner: No    Emotionally Abused: No    Physically Abused: No    Sexually Abused: No    Outpatient Encounter Medications as of 07/23/2024  Medication Sig   acetaminophen  (TYLENOL ) 500 MG tablet Take 500 mg by mouth every 6 (six) hours as needed for mild pain (pain score 1-3) or fever.   chlorproMAZINE  (THORAZINE ) 10 MG tablet Take 1 tablet (10 mg total) by mouth 3 (three) times daily as needed for hiccoughs.   cholecalciferol  (VITAMIN D ) 1000 UNITS tablet Take 1,000 Units by mouth every evening.   Continuous Glucose Sensor (FREESTYLE LIBRE 3 PLUS SENSOR) MISC Change sensor every 15 days.   dexamethasone  (DECADRON ) 1 MG tablet Take 1  tablet (1 mg total) by mouth daily with breakfast.   famotidine  (PEPCID ) 10 MG tablet Take 10 mg by mouth at bedtime. Chewable   folic acid  (FOLVITE ) 1 MG tablet Take 1 tablet (1 mg total) by mouth daily.   Krill Oil 300 MG CAPS Take 300 mg by mouth every evening.   levothyroxine  (SYNTHROID ) 112 MCG tablet Take 1 tablet (112 mcg total) by mouth daily before breakfast.   loratadine  (CLARITIN ) 10 MG tablet Take 10 mg by mouth every evening.    mirtazapine  (REMERON ) 15 MG tablet Take 1 tablet (15 mg total) by mouth at bedtime.   Multiple Vitamins-Minerals (ONE A DAY MEN 50 PLUS) TABS Take 1 tablet by mouth at bedtime.   pantoprazole  (PROTONIX ) 40 MG tablet Take 1 tablet (40 mg total) by mouth every evening.   polyethylene glycol powder (GLYCOLAX /MIRALAX ) 17 GM/SCOOP powder Take 119 g by mouth daily as needed for mild constipation or moderate constipation.   rosuvastatin  (CRESTOR ) 20 MG tablet Take 1 tablet (20 mg total) by mouth daily.   SYRINGE-NEEDLE, DISP, 3 ML (B-D 3CC LUER-LOK SYR 21GX1-1/2) 21G X 1-1/2 3 ML MISC Use to inject testosterone  every week   testosterone  cypionate (DEPOTESTOTERONE CYPIONATE) 100 MG/ML  injection Take 50mg  ( 0.50ml) and 100mg  ( 1ml) into muscle alternatively weekly.   venlafaxine  XR (EFFEXOR -XR) 75 MG 24 hr capsule Take 2 capsules (150 mg total) by mouth daily with breakfast. (Patient taking differently: Take 150 mg by mouth at bedtime.)   [DISCONTINUED] ondansetron  (ZOFRAN ) 4 MG tablet Take 1 tablet (4 mg total) by mouth every 6 (six) hours as needed for nausea.   ondansetron  (ZOFRAN ) 4 MG tablet Take 2 tablets (8 mg total) by mouth every 8 (eight) hours as needed for nausea.   No facility-administered encounter medications on file as of 07/23/2024.    No Known Allergies  Pertinent ROS per HPI, otherwise unremarkable      Objective:  BP 128/80   Pulse (!) 118   Temp (!) 96.8 F (36 C)   Ht 5' 11.5 (1.816 m)   Wt 193 lb 12.8 oz (87.9 kg)   SpO2 97%   BMI 26.65 kg/m    Wt Readings from Last 3 Encounters:  07/23/24 193 lb 12.8 oz (87.9 kg)  07/20/24 192 lb 4.8 oz (87.2 kg)  07/16/24 189 lb (85.7 kg)    Physical Exam Vitals and nursing note reviewed.  Constitutional:      General: He is not in acute distress.    Appearance: He is ill-appearing. He is not toxic-appearing or diaphoretic.  HENT:     Head: Normocephalic and atraumatic.     Nose: Nose normal.     Mouth/Throat:     Mouth: Mucous membranes are moist.  Eyes:     Pupils: Pupils are equal, round, and reactive to light.  Cardiovascular:     Rate and Rhythm: Normal rate and regular rhythm.     Heart sounds: Normal heart sounds.     Comments: Puffiness of hands, face, and legs Pulmonary:     Effort: Pulmonary effort is normal.     Breath sounds: Decreased breath sounds present.  Abdominal:     General: There is distension.  Musculoskeletal:     Right lower leg: Edema present.     Left lower leg: Edema present.  Neurological:     General: No focal deficit present.     Mental Status: He is alert and oriented to person, place, and time.  Motor: Weakness (generalized) present.  Psychiatric:         Mood and Affect: Mood normal.        Behavior: Behavior normal.        Thought Content: Thought content normal.        Judgment: Judgment normal.       Results for orders placed or performed in visit on 07/20/24  CMP (Cancer Center only)   Collection Time: 07/20/24 10:30 AM  Result Value Ref Range   Sodium 141 135 - 145 mmol/L   Potassium 3.7 3.5 - 5.1 mmol/L   Chloride 106 98 - 111 mmol/L   CO2 28 22 - 32 mmol/L   Glucose, Bld 150 (H) 70 - 99 mg/dL   BUN 12 8 - 23 mg/dL   Creatinine 8.95 9.38 - 1.24 mg/dL   Calcium  8.9 8.9 - 10.3 mg/dL   Total Protein 6.4 (L) 6.5 - 8.1 g/dL   Albumin 3.6 3.5 - 5.0 g/dL   AST 17 15 - 41 U/L   ALT 15 0 - 44 U/L   Alkaline Phosphatase 55 38 - 126 U/L   Total Bilirubin 0.3 0.0 - 1.2 mg/dL   GFR, Estimated >39 >39 mL/min   Anion gap 7 5 - 15  CBC with Differential (Cancer Center Only)   Collection Time: 07/20/24 10:30 AM  Result Value Ref Range   WBC Count 11.8 (H) 4.0 - 10.5 K/uL   RBC 4.29 4.22 - 5.81 MIL/uL   Hemoglobin 10.4 (L) 13.0 - 17.0 g/dL   HCT 64.0 (L) 60.9 - 47.9 %   MCV 83.7 80.0 - 100.0 fL   MCH 24.2 (L) 26.0 - 34.0 pg   MCHC 29.0 (L) 30.0 - 36.0 g/dL   RDW 79.2 (H) 88.4 - 84.4 %   Platelet Count 353 150 - 400 K/uL   nRBC 0.0 0.0 - 0.2 %   Neutrophils Relative % 83 %   Neutro Abs 9.8 (H) 1.7 - 7.7 K/uL   Lymphocytes Relative 10 %   Lymphs Abs 1.2 0.7 - 4.0 K/uL   Monocytes Relative 5 %   Monocytes Absolute 0.6 0.1 - 1.0 K/uL   Eosinophils Relative 0 %   Eosinophils Absolute 0.0 0.0 - 0.5 K/uL   Basophils Relative 1 %   Basophils Absolute 0.1 0.0 - 0.1 K/uL   Immature Granulocytes 1 %   Abs Immature Granulocytes 0.06 0.00 - 0.07 K/uL   *Note: Due to a large number of results and/or encounters for the requested time period, some results have not been displayed. A complete set of results can be found in Results Review.       Pertinent labs & imaging results that were available during my care of the patient  were reviewed by me and considered in my medical decision making.  Assessment & Plan:  Rayquan was seen today for medical management of chronic issues.  Diagnoses and all orders for this visit:  Hyperlipidemia associated with type 2 diabetes mellitus (HCC)  Hypertension associated with type 2 diabetes mellitus (HCC)  Type 2 diabetes mellitus with hyperglycemia, without long-term current use of insulin  (HCC)  Hypoalbuminemia due to protein-calorie malnutrition  Nausea in adult -     ondansetron  (ZOFRAN ) 4 MG tablet; Take 2 tablets (8 mg total) by mouth every 8 (eight) hours as needed for nausea.  Hiccups  Secondary malignant neoplasm of brain (HCC)  Adenocarcinoma of right lung, stage 4 (HCC)  Adrenal insufficiency  Malignant neoplasm undergoing chemotherapy Currently receiving treatment with a slight increase in WBC to 11.8. Hemoglobin and hematocrit are stable. Low protein and albumin levels contribute to puffiness. Renal and liver functions are well-managed except for low protein and albumin. - Continue to follow up with oncology - Monitor labs during next treatment on November 4th - Increase dietary protein intake to address low protein and albumin levels  Nausea and persistent hiccups associated with chemotherapy Experiencing nausea and persistent hiccups likely related to chemotherapy. Nausea is mild and transient. Hiccups have worsened recently. Thorazine  is effective for hiccups but causes drowsiness. Zofran  is available for nausea but not currently used. - Administer Thorazine  for persistent hiccups, monitor for drowsiness - Increase Zofran  dosage to 8 mg if nausea persists - Monitor for improvement in nausea and hiccups with increased protein intake  Protein deficiency Low protein and albumin levels contributing to puffiness. - Increase protein intake through diet, including protein drinks and high-protein snacks like yogurt  Type 2 diabetes mellitus with  hypoglycemia and hyperglycemia Experiencing fluctuations in blood glucose levels with recent hypoglycemia (48) and hyperglycemia (165). Overall, blood sugar levels are well-managed. - Use protein drinks and peanut butter crackers to manage hypoglycemia  Adrenal insufficiency on chronic steroids On chronic Decadron  therapy. Synthroid  dosage increased to 112 mcg. Advised to double Decadron  dose if signs of infection appear. Discussion about wearing a medical ID indicating adrenal insufficiency and steroid dependence. - Continue Decadron  and Synthroid  as prescribed - Double Decadron  dose if signs of infection appear - Consider wearing a medical ID indicating adrenal insufficiency and steroid dependence          Continue all other maintenance medications.  Follow up plan: Return in 3 months (on 10/23/2024), or if symptoms worsen or fail to improve, for DM.   Continue healthy lifestyle choices, including diet (rich in fruits, vegetables, and lean proteins, and low in salt and simple carbohydrates) and exercise (at least 30 minutes of moderate physical activity daily).  Educational handout given for DM  The above assessment and management plan was discussed with the patient. The patient verbalized understanding of and has agreed to the management plan. Patient is aware to call the clinic if they develop any new symptoms or if symptoms persist or worsen. Patient is aware when to return to the clinic for a follow-up visit. Patient educated on when it is appropriate to go to the emergency department.   Bradley Bruns, FNP-C [facility] [phone]

## 2024-07-25 ENCOUNTER — Emergency Department (HOSPITAL_COMMUNITY)
Admission: EM | Admit: 2024-07-25 | Discharge: 2024-07-25 | Disposition: A | Discharge status: Home / Self Care | Attending: Emergency Medicine | Admitting: Emergency Medicine

## 2024-07-25 ENCOUNTER — Emergency Department (HOSPITAL_COMMUNITY)

## 2024-07-25 ENCOUNTER — Other Ambulatory Visit: Payer: Self-pay

## 2024-07-25 ENCOUNTER — Encounter (HOSPITAL_COMMUNITY): Payer: Self-pay | Admitting: Emergency Medicine

## 2024-07-25 DIAGNOSIS — D72829 Elevated white blood cell count, unspecified: Secondary | ICD-10-CM | POA: Insufficient documentation

## 2024-07-25 DIAGNOSIS — C3490 Malignant neoplasm of unspecified part of unspecified bronchus or lung: Secondary | ICD-10-CM | POA: Diagnosis not present

## 2024-07-25 DIAGNOSIS — R0602 Shortness of breath: Secondary | ICD-10-CM | POA: Diagnosis not present

## 2024-07-25 DIAGNOSIS — R112 Nausea with vomiting, unspecified: Secondary | ICD-10-CM | POA: Diagnosis not present

## 2024-07-25 DIAGNOSIS — Z85118 Personal history of other malignant neoplasm of bronchus and lung: Secondary | ICD-10-CM | POA: Insufficient documentation

## 2024-07-25 DIAGNOSIS — R000 Tachycardia, unspecified: Secondary | ICD-10-CM | POA: Insufficient documentation

## 2024-07-25 DIAGNOSIS — J929 Pleural plaque without asbestos: Secondary | ICD-10-CM | POA: Diagnosis not present

## 2024-07-25 LAB — COMPREHENSIVE METABOLIC PANEL WITH GFR
ALT: 20 U/L (ref 0–44)
AST: 26 U/L (ref 15–41)
Albumin: 3.9 g/dL (ref 3.5–5.0)
Alkaline Phosphatase: 77 U/L (ref 38–126)
Anion gap: 12 (ref 5–15)
BUN: 16 mg/dL (ref 8–23)
CO2: 26 mmol/L (ref 22–32)
Calcium: 10.1 mg/dL (ref 8.9–10.3)
Chloride: 102 mmol/L (ref 98–111)
Creatinine, Ser: 1.05 mg/dL (ref 0.61–1.24)
GFR, Estimated: >60 mL/min (ref >60)
Glucose, Bld: 127 mg/dL — ABNORMAL HIGH (ref 70–99)
Potassium: 3.8 mmol/L (ref 3.5–5.1)
Sodium: 140 mmol/L (ref 135–145)
Total Bilirubin: 0.5 mg/dL (ref 0.0–1.2)
Total Protein: 7.7 g/dL (ref 6.5–8.1)

## 2024-07-25 LAB — CBC
HCT: 40 % (ref 39.0–52.0)
Hemoglobin: 11.7 g/dL — ABNORMAL LOW (ref 13.0–17.0)
MCH: 24.7 pg — ABNORMAL LOW (ref 26.0–34.0)
MCHC: 29.3 g/dL — ABNORMAL LOW (ref 30.0–36.0)
MCV: 84.6 fL (ref 80.0–100.0)
Platelets: 400 K/uL (ref 150–400)
RBC: 4.73 MIL/uL (ref 4.22–5.81)
RDW: 20.8 % — ABNORMAL HIGH (ref 11.5–15.5)
WBC: 14.8 K/uL — ABNORMAL HIGH (ref 4.0–10.5)
nRBC: 0 % (ref 0.0–0.2)

## 2024-07-25 LAB — LIPASE, BLOOD: Lipase: 29 U/L (ref 11–51)

## 2024-07-25 MED ORDER — LACTATED RINGERS IV BOLUS
1000.0000 mL | Freq: Once | INTRAVENOUS | Status: AC
  Administered 2024-07-25: 1000 mL via INTRAVENOUS

## 2024-07-25 MED ORDER — ONDANSETRON HCL 4 MG/2ML IJ SOLN
4.0000 mg | Freq: Once | INTRAMUSCULAR | Status: AC
  Administered 2024-07-25: 4 mg via INTRAVENOUS
  Filled 2024-07-25: qty 2

## 2024-07-25 NOTE — ED Provider Notes (Signed)
 [place] EMERGENCY DEPARTMENT AT [facility]  Provider Note  CSN: [id number] Arrival date & time: 07/25/24 0016  History Chief Complaint  Patient presents with   Emesis    Bradley Goodman is a 68 y.o. male with history of stage IV lung cancer with brain mets, currently getting palliative systemic chemo with Alimta  and Keytruda  reports 1 day of general malaise and then onset of nausea and vomiting this evening. Has had similar in the past a few days after chemo, but previous to that was tolerating well. Denies fever or abdominal pain. Was seen for similar about a month ago and admitted for suspected sepsis but a source was not found. Reports Dexcom was reading 64 at home.    Home Medications Prior to Admission medications   Medication Sig Start Date End Date Taking? Authorizing Provider  acetaminophen  (TYLENOL ) 500 MG tablet Take 500 mg by mouth every 6 (six) hours as needed for mild pain (pain score 1-3) or fever.    [provider]  chlorproMAZINE  (THORAZINE ) 10 MG tablet Take 1 tablet (10 mg total) by mouth 3 (three) times daily as needed for hiccoughs. 11/27/23   [name], FNP  cholecalciferol  (VITAMIN D ) 1000 UNITS tablet Take 1,000 Units by mouth every evening.    [provider]  Continuous Glucose Sensor (FREESTYLE LIBRE 3 PLUS SENSOR) MISC Change sensor every 15 days. 03/10/24   [name], FNP  dexamethasone  (DECADRON ) 1 MG tablet Take 1 tablet (1 mg total) by mouth daily with breakfast. 05/04/24   Nida, Ethelle ORN, MD  famotidine  (PEPCID ) 10 MG tablet Take 10 mg by mouth at bedtime. Chewable    [provider]  folic acid  (FOLVITE ) 1 MG tablet Take 1 tablet (1 mg total) by mouth daily. 07/16/24   [name], FNP  Krill Oil 300 MG CAPS Take 300 mg by mouth every evening.    [provider]  levothyroxine  (SYNTHROID ) 112 MCG tablet Take 1 tablet (112 mcg total) by mouth daily before breakfast. 07/15/24   Nida,  Gebreselassie W, MD  loratadine  (CLARITIN ) 10 MG tablet Take 10 mg by mouth every evening.     [provider]  mirtazapine  (REMERON ) 15 MG tablet Take 1 tablet (15 mg total) by mouth at bedtime. 04/22/24   [name], FNP  Multiple Vitamins-Minerals (ONE A DAY MEN 50 PLUS) TABS Take 1 tablet by mouth at bedtime.    [provider]  ondansetron  (ZOFRAN ) 4 MG tablet Take 2 tablets (8 mg total) by mouth every 8 (eight) hours as needed for nausea. 07/23/24   [name], FNP  pantoprazole  (PROTONIX ) 40 MG tablet Take 1 tablet (40 mg total) by mouth every evening. 07/16/24   [name], FNP  polyethylene glycol powder (GLYCOLAX /MIRALAX ) 17 GM/SCOOP powder Take 119 g by mouth daily as needed for mild constipation or moderate constipation.    [provider]  rosuvastatin  (CRESTOR ) 20 MG tablet Take 1 tablet (20 mg total) by mouth daily. 07/16/24   [name], [name], FNP  SYRINGE-NEEDLE, DISP, 3 ML (B-D 3CC LUER-LOK SYR 21GX1-1/2) 21G X 1-1/2 3 ML MISC Use to inject testosterone  every week 03/10/24   Nida, Gebreselassie W, MD  testosterone  cypionate (DEPOTESTOTERONE CYPIONATE) 100 MG/ML injection Take 50mg  ( 0.64ml) and 100mg  ( 1ml) into muscle alternatively weekly. 07/15/24   Nida, Gebreselassie W, MD  venlafaxine  XR (EFFEXOR -XR) 75 MG 24 hr capsule Take 2 capsules (150 mg total) by mouth  daily with breakfast. Patient taking differently: Take 150 mg by mouth at bedtime. 04/22/24   [name], FNP     Allergies    Patient has no known allergies.   Review of Systems   Review of Systems Please see HPI for pertinent positives and negatives  Physical Exam BP (!) 138/104   Pulse (!) 109   Temp 98.7 F (37.1 C) (Oral)   Resp 20   Ht 5' 0.5 (1.537 m)   Wt 88 kg   SpO2 91%   BMI 37.27 kg/m   Physical Exam Vitals and nursing note reviewed.  Constitutional:      Appearance: Normal appearance.  HENT:     Head: Normocephalic and atraumatic.     Nose: Nose  normal.     Mouth/Throat:     Mouth: Mucous membranes are dry.  Eyes:     Extraocular Movements: Extraocular movements intact.     Conjunctiva/sclera: Conjunctivae normal.  Cardiovascular:     Rate and Rhythm: Tachycardia present.  Pulmonary:     Effort: Pulmonary effort is normal.     Breath sounds: Normal breath sounds.  Abdominal:     General: Abdomen is flat.     Palpations: Abdomen is soft.     Tenderness: There is no abdominal tenderness.  Musculoskeletal:        General: No swelling. Normal range of motion.     Cervical back: Neck supple.  Skin:    General: Skin is warm and dry.  Neurological:     General: No focal deficit present.     Mental Status: He is alert.  Psychiatric:        Mood and Affect: Mood normal.     ED Results / Procedures / Treatments   EKG None  Procedures Procedures  Medications Ordered in the ED Medications  ondansetron  (ZOFRAN ) injection 4 mg (4 mg Intravenous Given 07/25/24 0051)  lactated ringers  bolus 1,000 mL (0 mLs Intravenous Stopped 07/25/24 0235)    Initial Impression and Plan  Patient here with N/V, no fever, abdominal pain or diarrhea. Suspect this is chemo side effects given timing but will check labs and give IVF/zofran  for comfort. BP not low, doubt adrenal crisis.   ED Course   Clinical Course as of 07/25/24 0235  Sat Jul 25, 2024  0052 CBC with mild leukocytosis.  [CS]  0104 CMP is unremarkable. Lipase is normal.  [CS]  0140 I personally viewed the images from radiology studies and agree with radiologist interpretation: CXR without acute findings.  [CS]  [id number] Patient feeling much better. Tolerating PO fluids and eager to go home. He has zofran  at home if needed. Recommend he discuss likely chemo side effects with Oncology at next visit. RTED for any concerns. [CS]    Clinical Course User Index [CS] Roselyn Carlin NOVAK, MD     MDM Rules/Calculators/A&P Medical Decision Making Given presenting complaint, I  considered that admission might be necessary. After review of results from ED lab and/or imaging studies, admission to the hospital is not indicated at this time.    Problems Addressed: Nausea and vomiting, unspecified vomiting type: acute illness or injury  Amount and/or Complexity of Data Reviewed Labs: ordered. Decision-making details documented in ED Course. Radiology: ordered and independent interpretation performed. Decision-making details documented in ED Course.  Risk Prescription drug management. Decision regarding hospitalization.     Final Clinical Impression(s) / ED Diagnoses Final diagnoses:  Nausea and vomiting, unspecified vomiting type    Rx /  DC Orders ED Discharge Orders     None        Roselyn Carlin NOVAK, MD 07/25/24 0236

## 2024-07-25 NOTE — ED Triage Notes (Signed)
 Pt here POV. States he has felt unwell all day. Pt c/o emesis since 10pm and chest tightness. Pt reports cbg of 64 but unable to keep anything down.

## 2024-07-27 ENCOUNTER — Other Ambulatory Visit: Payer: Self-pay

## 2024-07-28 NOTE — Telephone Encounter (Signed)
 Pharmacy Patient Advocate Encounter  Received notification from [facility] that Prior Authorization for  testosterone  cyp 100mg /ml  has been APPROVED from 07-23-2024 to 07-22-2025   PA #/Case ID/Reference #: [id number]

## 2024-08-05 NOTE — Progress Notes (Unsigned)
 [facility] OFFICE PROGRESS NOTE  [name], FNP [address] [place] [address]  DIAGNOSIS: Stage IV (T1b, N3, M1C) non-small cell lung cancer, favoring adenocarcinoma presented with right upper lobe lung nodule in addition to right hilar, subcarinal and bilateral mediastinal as well as supraclavicular lymphadenopathy in addition to bone and brain metastasis diagnosed in June 2022.   PD-L1 expression 80%.     Molecular Studies:  Biomarker Findings Microsatellite status - MS-Stable Tumor Mutational Burden - 6 Muts/Mb Genomic Findings For a complete list of the genes assayed, please refer to the Appendix. KRAS G12C, amplification ATM S470* CCND1 amplification - equivocal? HGF amplification - equivocal? MYC amplification - equivocal? FGF19 amplification - equivocal? FGF3 amplification - equivocal? FGF4 amplification - equivocal? NFKBIA amplification NKX2-1 amplification RAD21 amplification - equivocal? [id number]*26 TERT promoter -124C>T TP53 rearrangement exon 9 7 Disease relevant genes with no reportable alterations: ALK, BRAF, EGFR, ERBB2, MET, RET, ROS1   PRIOR THERAPY:  SRS to the metastatic brain lesions under the care of Dr. Patrcia.  Last treatment on 05/04/2021.   CURRENT THERAPY:  Palliative systemic chemotherapy with carboplatin  for an AUC 5, Alimta  500 mg/m2 and, Keytruda  200 mg IV every 3 weeks.  First dose expected on 05/08/2021.  Status post 55 cycles.  Starting from cycle #5 he is on maintenance treatment with Alimta  and Keytruda  every 3 weeks.    INTERVAL HISTORY: Bradley Goodman 68 y.o. male returnsto the clinic today for a follow-up visit accompanied by his wife. The patient was last seen by Dr. Sherrod 3 weeks ago. He is currently on treatment with Alimta  and keytruda .    In the interval since last being seen her presented to the [facility] on 10/18 for emesis. In the past he was admitted for suspect sepsis but no source was  found   Today, he denies any fever, chills, or night sweats. Denies any dyspnea, cough, chest pain, or hemoptysis.  Denies any nausea, vomiting, or diarrhea. He sometimes has constipation which he manages with miralax . He denies unusual headaches.  Denies rashes and skin changes except for easy bruising. He is on steroids for adrenal insufficiency. He is followed by endocrinology. He also has hypothyroidism and hypogonadism. He is here today for evaluation and repeat blood work before undergoing cycle #56    MEDICAL HISTORY: Past Medical History:  Diagnosis Date   Brain cancer (HCC)    Cervical spondylolysis    Depression    Essential hypertension    GERD (gastroesophageal reflux disease)    History of kidney stones    History of migraine    Hyperlipidemia    Hypertension    lung ca with mets to brain    Lung cancer (HCC)    PONV (postoperative nausea and vomiting)    Type 2 diabetes mellitus (HCC)     ALLERGIES:  has no known allergies.  MEDICATIONS:  Current Outpatient Medications  Medication Sig Dispense Refill   acetaminophen  (TYLENOL ) 500 MG tablet Take 500 mg by mouth every 6 (six) hours as needed for mild pain (pain score 1-3) or fever.     chlorproMAZINE  (THORAZINE ) 10 MG tablet Take 1 tablet (10 mg total) by mouth 3 (three) times daily as needed for hiccoughs. 90 tablet 0   cholecalciferol  (VITAMIN D ) 1000 UNITS tablet Take 1,000 Units by mouth every evening.     Continuous Glucose Sensor (FREESTYLE LIBRE 3 PLUS SENSOR) MISC Change sensor every 15 days. 1 each 11   dexamethasone  (DECADRON )  1 MG tablet Take 1 tablet (1 mg total) by mouth daily with breakfast. 90 tablet 1   famotidine  (PEPCID ) 10 MG tablet Take 10 mg by mouth at bedtime. Chewable     folic acid  (FOLVITE ) 1 MG tablet Take 1 tablet (1 mg total) by mouth daily. 90 tablet 0   Krill Oil 300 MG CAPS Take 300 mg by mouth every evening.     levothyroxine  (SYNTHROID ) 112 MCG tablet Take 1 tablet (112 mcg total) by  mouth daily before breakfast. 90 tablet 1   loratadine  (CLARITIN ) 10 MG tablet Take 10 mg by mouth every evening.      mirtazapine  (REMERON ) 15 MG tablet Take 1 tablet (15 mg total) by mouth at bedtime. 90 tablet 1   Multiple Vitamins-Minerals (ONE A DAY MEN 50 PLUS) TABS Take 1 tablet by mouth at bedtime.     ondansetron  (ZOFRAN ) 4 MG tablet Take 2 tablets (8 mg total) by mouth every 8 (eight) hours as needed for nausea. 60 tablet 0   pantoprazole  (PROTONIX ) 40 MG tablet Take 1 tablet (40 mg total) by mouth every evening. 90 tablet 0   polyethylene glycol powder (GLYCOLAX /MIRALAX ) 17 GM/SCOOP powder Take 119 g by mouth daily as needed for mild constipation or moderate constipation.     rosuvastatin  (CRESTOR ) 20 MG tablet Take 1 tablet (20 mg total) by mouth daily. 90 tablet 0   SYRINGE-NEEDLE, DISP, 3 ML (B-D 3CC LUER-LOK SYR 21GX1-1/2) 21G X 1-1/2 3 ML MISC Use to inject testosterone  every week 100 each 2   testosterone  cypionate (DEPOTESTOTERONE CYPIONATE) 100 MG/ML injection Take 50mg  ( 0.70ml) and 100mg  ( 1ml) into muscle alternatively weekly. 10 mL 1   venlafaxine  XR (EFFEXOR -XR) 75 MG 24 hr capsule Take 2 capsules (150 mg total) by mouth daily with breakfast. (Patient taking differently: Take 150 mg by mouth at bedtime.) 180 capsule 2   No current facility-administered medications for this visit.    SURGICAL HISTORY:  Past Surgical History:  Procedure Laterality Date   Bilateral inguinal hernia repair     BRONCHIAL NEEDLE ASPIRATION BIOPSY  04/05/2021   Procedure: BRONCHIAL NEEDLE ASPIRATION BIOPSIES;  Surgeon: Brenna Adine CROME, DO;  Location: [facility];  Service: Pulmonary;;   COLONOSCOPY  01/23/2012   Procedure: COLONOSCOPY;  Surgeon: Lamar CHRISTELLA Hollingshead, MD;  Location: [facility];  Service: Endoscopy;  Laterality: N/A;  9:30 AM   COLONOSCOPY N/A 10/11/2015   Procedure: COLONOSCOPY;  Surgeon: Lamar CHRISTELLA Hollingshead, MD;  Location: [facility];  Service: Endoscopy;  Laterality: N/A;  830    COLONOSCOPY N/A 10/14/2019   Procedure: COLONOSCOPY;  Surgeon: Hollingshead Lamar CHRISTELLA, MD;  Location: [facility];  Service: Endoscopy;  Laterality: N/A;  1:45   HERNIA REPAIR     POLYPECTOMY  10/14/2019   Procedure: POLYPECTOMY;  Surgeon: Hollingshead Lamar CHRISTELLA, MD;  Location: [facility];  Service: Endoscopy;;  ascending colon, descending colon   VIDEO BRONCHOSCOPY WITH ENDOBRONCHIAL ULTRASOUND N/A 04/05/2021   Procedure: VIDEO BRONCHOSCOPY WITH ENDOBRONCHIAL ULTRASOUND;  Surgeon: Brenna Adine CROME, DO;  Location: [facility];  Service: Pulmonary;  Laterality: N/A;    REVIEW OF SYSTEMS:   Review of Systems  Constitutional: Negative for appetite change, chills, fatigue, fever and unexpected weight change.  HENT:   Negative for mouth sores, nosebleeds, sore throat and trouble swallowing.   Eyes: Negative for eye problems and icterus.  Respiratory: Negative for cough, hemoptysis, shortness of breath and wheezing.   Cardiovascular: Negative for chest pain and  leg swelling.  Gastrointestinal: Negative for abdominal pain, constipation, diarrhea, nausea and vomiting.  Genitourinary: Negative for bladder incontinence, difficulty urinating, dysuria, frequency and hematuria.   Musculoskeletal: Negative for back pain, gait problem, neck pain and neck stiffness.  Skin: Negative for itching and rash.  Neurological: Negative for dizziness, extremity weakness, gait problem, headaches, light-headedness and seizures.  Hematological: Negative for adenopathy. Does not bruise/bleed easily.  Psychiatric/Behavioral: Negative for confusion, depression and sleep disturbance. The patient is not nervous/anxious.     PHYSICAL EXAMINATION:  There were no vitals taken for this visit.  ECOG PERFORMANCE STATUS: {CHL ONC ECOG [id number]  Physical Exam  Constitutional: Oriented to person, place, and time and well-developed, well-nourished, and in no distress. No distress.  HENT:  Head: Normocephalic and atraumatic.   Mouth/Throat: Oropharynx is clear and moist. No oropharyngeal exudate.  Eyes: Conjunctivae are normal. Right eye exhibits no discharge. Left eye exhibits no discharge. No scleral icterus.  Neck: Normal range of motion. Neck supple.  Cardiovascular: Normal rate, regular rhythm, normal heart sounds and intact distal pulses.   Pulmonary/Chest: Effort normal and breath sounds normal. No respiratory distress. No wheezes. No rales.  Abdominal: Soft. Bowel sounds are normal. Exhibits no distension and no mass. There is no tenderness.  Musculoskeletal: Normal range of motion. Exhibits no edema.  Lymphadenopathy:    No cervical adenopathy.  Neurological: Alert and oriented to person, place, and time. Exhibits normal muscle tone. Gait normal. Coordination normal.  Skin: Skin is warm and dry. No rash noted. Not diaphoretic. No erythema. No pallor.  Psychiatric: Mood, memory and judgment normal.  Vitals reviewed.  LABORATORY DATA: Lab Results  Component Value Date   WBC 14.8 (H) 07/25/2024   HGB 11.7 (L) 07/25/2024   HCT 40.0 07/25/2024   MCV 84.6 07/25/2024   PLT 400 07/25/2024      Chemistry      Component Value Date/Time   NA 140 07/25/2024 0037   NA 145 (H) 04/22/2024 0917   K 3.8 07/25/2024 0037   CL 102 07/25/2024 0037   CO2 26 07/25/2024 0037   BUN 16 07/25/2024 0037   BUN 10 04/22/2024 0917   CREATININE 1.05 07/25/2024 0037   CREATININE 1.04 07/20/2024 1030      Component Value Date/Time   CALCIUM  10.1 07/25/2024 0037   ALKPHOS 77 07/25/2024 0037   AST 26 07/25/2024 0037   AST 17 07/20/2024 1030   ALT 20 07/25/2024 0037   ALT 15 07/20/2024 1030   BILITOT 0.5 07/25/2024 0037   BILITOT 0.3 07/20/2024 1030       RADIOGRAPHIC STUDIES:  DG Chest Port 1 View Result Date: 07/25/2024 EXAM: 1 VIEW(S) XRAY OF THE CHEST 07/25/2024 01:09:46 AM COMPARISON: 06/13/2024 CLINICAL HISTORY: SOB, lung cancer. Pt here POV. States he has felt unwell all day. Pt c/o emesis since 10pm and  chest tightness. Pt reports cbg of 64 but unable to keep anything down. FINDINGS: LUNGS AND PLEURA: Persistent right lung nodular density. Persistent left apical pleural thickening. No pulmonary edema. No pleural effusion. No pneumothorax. HEART AND MEDIASTINUM: No acute abnormality of the cardiac and mediastinal silhouettes. BONES AND SOFT TISSUES: No acute osseous abnormality. IMPRESSION: 1. No acute cardiopulmonary process. 2. Persistent right lung nodular density better evaluated on CT 9 / 6 / 25. See that report for recommendations. Electronically signed by: Norman Gatlin MD 07/25/2024 01:22 AM EDT RP Workstation: [id number]     ASSESSMENT/PLAN:  This is a very pleasant 68 year old Caucasian male diagnosed with stage IV (  T1b, N3, M1 C) non-small cell lung cancer, favoring adenocarcinoma.  The patient presented with a right upper lobe lung nodule in addition to right hilar, subcarinal, and bilateral mediastinal lymphadenopathy as well as supraclavicular lymphadenopathy.  The patient also has metastatic disease to the brain.  He was diagnosed in June 2022.  His PD-L1 expression is 80%.  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    The patient completed SRS to the brain lesions under the care of Dr. Patrcia.  His last treatment was on 05/04/2021. 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     The patient is currently undergoing systemic chemotherapy with carboplatin  for an AUC of 5, Alimta  500 mg per metered squared, Keytruda  200 mg IV every 3 weeks.  Status post 55 cycles. Starting from cycle #5, the patient started maintenance Alimta  and Keytruda  IV every 3 weeks.      He is ok to treat with pulse of *** bpm today.    Labs were reviewed.  Recommend that he proceed with cycle #56 today as scheduled.   We will see him back for follow-up visit in 3 weeks for  evaluation repeat blood work before undergoing cycle #57  ***nausea ***         No orders of the defined types were placed in this encounter.    I spent {CHL ONC TIME VISIT - DTPQU:[id number]} counseling the patient face to face. The total time spent in the appointment was {CHL ONC TIME VISIT - DTPQU:[id number]}.  Cherlyn Syring L Kenyen Candy, PA-C 08/05/24

## 2024-08-06 MED FILL — Dexamethasone Tab 1 MG: ORAL | 90 days supply | Qty: 90 | Fill #1 | Status: AC

## 2024-08-07 ENCOUNTER — Other Ambulatory Visit (HOSPITAL_COMMUNITY): Payer: Self-pay

## 2024-08-07 ENCOUNTER — Ambulatory Visit (HOSPITAL_COMMUNITY)
Admission: RE | Admit: 2024-08-07 | Discharge: 2024-08-07 | Disposition: A | Discharge status: Home / Self Care | Source: Ambulatory Visit | Attending: Physician Assistant | Admitting: Physician Assistant

## 2024-08-07 DIAGNOSIS — I3139 Other pericardial effusion (noninflammatory): Secondary | ICD-10-CM | POA: Diagnosis not present

## 2024-08-07 LAB — ECHOCARDIOGRAM LIMITED: S' Lateral: 3.3 cm

## 2024-08-07 NOTE — Progress Notes (Signed)
*  PRELIMINARY RESULTS* Echocardiogram Limited 2-D Echocardiogram has been performed.  Bradley Goodman 08/07/2024, 11:01 AM

## 2024-08-11 ENCOUNTER — Inpatient Hospital Stay: Admitting: Physician Assistant

## 2024-08-11 ENCOUNTER — Other Ambulatory Visit: Payer: Self-pay

## 2024-08-11 ENCOUNTER — Inpatient Hospital Stay: Attending: Physician Assistant

## 2024-08-11 ENCOUNTER — Inpatient Hospital Stay

## 2024-08-11 ENCOUNTER — Encounter: Payer: Self-pay | Admitting: Internal Medicine

## 2024-08-11 ENCOUNTER — Other Ambulatory Visit (HOSPITAL_COMMUNITY): Payer: Self-pay

## 2024-08-11 VITALS — BP 150/89 | HR 106 | Temp 98.3°F | Resp 17 | Wt 191.5 lb

## 2024-08-11 DIAGNOSIS — C3491 Malignant neoplasm of unspecified part of right bronchus or lung: Secondary | ICD-10-CM | POA: Diagnosis not present

## 2024-08-11 DIAGNOSIS — I3139 Other pericardial effusion (noninflammatory): Secondary | ICD-10-CM | POA: Diagnosis not present

## 2024-08-11 DIAGNOSIS — Z5111 Encounter for antineoplastic chemotherapy: Secondary | ICD-10-CM

## 2024-08-11 DIAGNOSIS — Z79899 Other long term (current) drug therapy: Secondary | ICD-10-CM | POA: Insufficient documentation

## 2024-08-11 DIAGNOSIS — Z5112 Encounter for antineoplastic immunotherapy: Secondary | ICD-10-CM | POA: Insufficient documentation

## 2024-08-11 DIAGNOSIS — E230 Hypopituitarism: Secondary | ICD-10-CM | POA: Diagnosis not present

## 2024-08-11 DIAGNOSIS — C3411 Malignant neoplasm of upper lobe, right bronchus or lung: Secondary | ICD-10-CM | POA: Insufficient documentation

## 2024-08-11 DIAGNOSIS — C7931 Secondary malignant neoplasm of brain: Secondary | ICD-10-CM | POA: Diagnosis not present

## 2024-08-11 DIAGNOSIS — Z79890 Hormone replacement therapy (postmenopausal): Secondary | ICD-10-CM | POA: Diagnosis not present

## 2024-08-11 DIAGNOSIS — Z7952 Long term (current) use of systemic steroids: Secondary | ICD-10-CM | POA: Diagnosis not present

## 2024-08-11 DIAGNOSIS — C7951 Secondary malignant neoplasm of bone: Secondary | ICD-10-CM | POA: Insufficient documentation

## 2024-08-11 DIAGNOSIS — T451X5A Adverse effect of antineoplastic and immunosuppressive drugs, initial encounter: Secondary | ICD-10-CM | POA: Diagnosis not present

## 2024-08-11 DIAGNOSIS — E039 Hypothyroidism, unspecified: Secondary | ICD-10-CM | POA: Diagnosis not present

## 2024-08-11 DIAGNOSIS — F17210 Nicotine dependence, cigarettes, uncomplicated: Secondary | ICD-10-CM | POA: Insufficient documentation

## 2024-08-11 DIAGNOSIS — E2740 Unspecified adrenocortical insufficiency: Secondary | ICD-10-CM | POA: Diagnosis not present

## 2024-08-11 DIAGNOSIS — R112 Nausea with vomiting, unspecified: Secondary | ICD-10-CM | POA: Insufficient documentation

## 2024-08-11 DIAGNOSIS — R609 Edema, unspecified: Secondary | ICD-10-CM | POA: Insufficient documentation

## 2024-08-11 DIAGNOSIS — K5900 Constipation, unspecified: Secondary | ICD-10-CM | POA: Diagnosis not present

## 2024-08-11 LAB — CBC WITH DIFFERENTIAL (CANCER CENTER ONLY)
Abs Immature Granulocytes: 0.04 K/uL (ref 0.00–0.07)
Basophils Absolute: 0.1 K/uL (ref 0.0–0.1)
Basophils Relative: 1 %
Eosinophils Absolute: 0.1 K/uL (ref 0.0–0.5)
Eosinophils Relative: 1 %
HCT: 39.1 % (ref 39.0–52.0)
Hemoglobin: 11.5 g/dL — ABNORMAL LOW (ref 13.0–17.0)
Immature Granulocytes: 0 %
Lymphocytes Relative: 23 %
Lymphs Abs: 2.4 K/uL (ref 0.7–4.0)
MCH: 24.1 pg — ABNORMAL LOW (ref 26.0–34.0)
MCHC: 29.4 g/dL — ABNORMAL LOW (ref 30.0–36.0)
MCV: 81.8 fL (ref 80.0–100.0)
Monocytes Absolute: 0.7 K/uL (ref 0.1–1.0)
Monocytes Relative: 7 %
Neutro Abs: 7 K/uL (ref 1.7–7.7)
Neutrophils Relative %: 68 %
Platelet Count: 461 K/uL — ABNORMAL HIGH (ref 150–400)
RBC: 4.78 MIL/uL (ref 4.22–5.81)
RDW: 20.9 % — ABNORMAL HIGH (ref 11.5–15.5)
WBC Count: 10.3 K/uL (ref 4.0–10.5)
nRBC: 0 % (ref 0.0–0.2)

## 2024-08-11 LAB — CMP (CANCER CENTER ONLY)
ALT: 17 U/L (ref 0–44)
AST: 19 U/L (ref 15–41)
Albumin: 4 g/dL (ref 3.5–5.0)
Alkaline Phosphatase: 69 U/L (ref 38–126)
Anion gap: 8 (ref 5–15)
BUN: 10 mg/dL (ref 8–23)
CO2: 28 mmol/L (ref 22–32)
Calcium: 9.1 mg/dL (ref 8.9–10.3)
Chloride: 105 mmol/L (ref 98–111)
Creatinine: 1 mg/dL (ref 0.61–1.24)
GFR, Estimated: >60 mL/min (ref >60)
Glucose, Bld: 105 mg/dL — ABNORMAL HIGH (ref 70–99)
Potassium: 4 mmol/L (ref 3.5–5.1)
Sodium: 141 mmol/L (ref 135–145)
Total Bilirubin: 0.3 mg/dL (ref 0.0–1.2)
Total Protein: 7.3 g/dL (ref 6.5–8.1)

## 2024-08-11 MED ORDER — SODIUM CHLORIDE 0.9 % IV SOLN
200.0000 mg | Freq: Once | INTRAVENOUS | Status: AC
  Administered 2024-08-11: 200 mg via INTRAVENOUS
  Filled 2024-08-11: qty 200

## 2024-08-11 MED ORDER — PROCHLORPERAZINE MALEATE 10 MG PO TABS
10.0000 mg | ORAL_TABLET | Freq: Once | ORAL | Status: AC
  Administered 2024-08-11: 10 mg via ORAL
  Filled 2024-08-11: qty 1

## 2024-08-11 MED ORDER — COLCHICINE 0.6 MG PO TABS
0.6000 mg | ORAL_TABLET | Freq: Two times a day (BID) | ORAL | 0 refills | Status: AC
  Filled 2024-08-11: qty 180, 90d supply, fill #0

## 2024-08-11 MED ORDER — SODIUM CHLORIDE 0.9 % IV SOLN
500.0000 mg/m2 | Freq: Once | INTRAVENOUS | Status: AC
  Administered 2024-08-11: 1000 mg via INTRAVENOUS
  Filled 2024-08-11: qty 40

## 2024-08-11 MED ORDER — SODIUM CHLORIDE 0.9 % IV SOLN: Freq: Once | INTRAVENOUS | Status: AC

## 2024-08-11 MED ORDER — IBUPROFEN 200 MG PO TABS
ORAL_TABLET | ORAL | 0 refills | Status: AC
  Filled 2024-08-11: qty 294, 35d supply, fill #0

## 2024-08-11 NOTE — Patient Instructions (Signed)
 [facility] - A DEPT OF [facility]  Discharge Instructions: Thank you for choosing [facility] to provide your oncology and hematology care.   If you have a lab appointment with the Cancer Center, please go directly to the Cancer Center and check in at the registration area.   Wear comfortable clothing and clothing appropriate for easy access to any Portacath or PICC line.   We strive to give you quality time with your provider. You may need to reschedule your appointment if you arrive late (15 or more minutes).  Arriving late affects you and other patients whose appointments are after yours.  Also, if you miss three or more appointments without notifying the office, you may be dismissed from the clinic at the provider's discretion.      For prescription refill requests, have your pharmacy contact our office and allow 72 hours for refills to be completed.    Today you received the following chemotherapy and/or immunotherapy agents: Pembrolizumab  (Keytruda ), Alimta  (Pemetrexed )       To help prevent nausea and vomiting after your treatment, we encourage you to take your nausea medication as directed.  BELOW ARE SYMPTOMS THAT SHOULD BE REPORTED IMMEDIATELY: *FEVER GREATER THAN 100.4 F (38 C) OR HIGHER *CHILLS OR SWEATING *NAUSEA AND VOMITING THAT IS NOT CONTROLLED WITH YOUR NAUSEA MEDICATION *UNUSUAL SHORTNESS OF BREATH *UNUSUAL BRUISING OR BLEEDING *URINARY PROBLEMS (pain or burning when urinating, or frequent urination) *BOWEL PROBLEMS (unusual diarrhea, constipation, pain near the anus) TENDERNESS IN MOUTH AND THROAT WITH OR WITHOUT PRESENCE OF ULCERS (sore throat, sores in mouth, or a toothache) UNUSUAL RASH, SWELLING OR PAIN  UNUSUAL VAGINAL DISCHARGE OR ITCHING   Items with * indicate a potential emergency and should be followed up as soon as possible or go to the Emergency Department if any problems should occur.  Please show the  CHEMOTHERAPY ALERT CARD or IMMUNOTHERAPY ALERT CARD at check-in to the Emergency Department and triage nurse.  Should you have questions after your visit or need to cancel or reschedule your appointment, please contact [facility] - A DEPT OF [facility]  Dept: [phone]  and follow the prompts.  Office hours are 8:00 a.m. to 4:30 p.m. Monday - Friday. Please note that voicemails left after 4:00 p.m. may not be returned until the following business day.  We are closed weekends and major holidays. You have access to a nurse at all times for urgent questions. Please call the main number to the clinic Dept: [phone] and follow the prompts.   For any non-urgent questions, you may also contact your provider using MyChart. We now offer e-Visits for anyone 68 and older to request care online for non-urgent symptoms. For details visit [email or web address].   Also download the MyChart app! Go to the app store, search MyChart, open the app, select [place], and log in with your MyChart username and password.

## 2024-08-11 NOTE — Addendum Note (Signed)
 Addended by: TAMELA RAISIN R on: 08/11/2024 01:49 PM   Modules accepted: Orders

## 2024-08-12 ENCOUNTER — Other Ambulatory Visit: Payer: Self-pay

## 2024-08-13 ENCOUNTER — Inpatient Hospital Stay

## 2024-08-13 ENCOUNTER — Inpatient Hospital Stay (HOSPITAL_BASED_OUTPATIENT_CLINIC_OR_DEPARTMENT_OTHER): Admitting: Genetic Counselor

## 2024-08-13 ENCOUNTER — Encounter: Payer: Self-pay | Admitting: Genetic Counselor

## 2024-08-13 ENCOUNTER — Other Ambulatory Visit: Payer: Self-pay | Admitting: Genetic Counselor

## 2024-08-13 DIAGNOSIS — Z1379 Encounter for other screening for genetic and chromosomal anomalies: Secondary | ICD-10-CM | POA: Diagnosis not present

## 2024-08-13 DIAGNOSIS — Z803 Family history of malignant neoplasm of breast: Secondary | ICD-10-CM | POA: Diagnosis not present

## 2024-08-13 DIAGNOSIS — C3491 Malignant neoplasm of unspecified part of right bronchus or lung: Secondary | ICD-10-CM

## 2024-08-13 DIAGNOSIS — C7931 Secondary malignant neoplasm of brain: Secondary | ICD-10-CM | POA: Diagnosis not present

## 2024-08-13 LAB — GENETIC SCREENING ORDER

## 2024-08-13 NOTE — Progress Notes (Signed)
 REFERRING PROVIDER: Sherrod Sherrod, MD [address] [place],  [place] [address]  PRIMARY PROVIDER:  [name], FNP  PRIMARY REASON FOR VISIT:  1. Family history of breast cancer   2. Adenocarcinoma of right lung, stage 4 (HCC)      HISTORY OF PRESENT ILLNESS:   Mr. Kerstetter, a 68 y.o. male, was seen for a [place] cancer genetics consultation at the request of Dr. Sherrod due to a personal and family history of cancer.  Mr. Borum presents to clinic today to discuss the possibility of a hereditary predisposition to cancer, genetic testing, and to further clarify his future cancer risks, as well as potential cancer risks for family members.   In July 2022, at the age of 75, Mr. Hoard was diagnosed with stage IV cancer of the lung. The treatment plan includes chemotherapy.  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      CANCER HISTORY:  Oncology History  Adenocarcinoma of right lung, stage 4 (HCC)  04/25/2021 Initial Diagnosis   Adenocarcinoma of right lung, stage 4 (HCC)   04/25/2021 Cancer Staging   Staging form: Lung, AJCC 8th Edition - Clinical: Stage IVB (cT1b, cN3, cM1c) - Signed by Sherrod Sherrod, MD on 04/25/2021   05/08/2021 -  Chemotherapy   Patient is on Treatment Plan : LUNG Carboplatin  (5) + Pemetrexed  (500) + Pembrolizumab  (200) D1 q21d Induction x 4 cycles / Maintenance Pemetrexed  (500) + Pembrolizumab  (200) D1 q21d        Past Medical History:  Diagnosis Date   Brain cancer (HCC)    Cervical spondylolysis    Depression    Essential hypertension    Family history of breast cancer    GERD (gastroesophageal reflux disease)    History of kidney stones    History of migraine    Hyperlipidemia    Hypertension    lung ca with mets to brain    Lung cancer (HCC)    PONV (postoperative nausea and vomiting)    Type 2 diabetes mellitus (HCC)     Past Surgical History:  Procedure Laterality Date    Bilateral inguinal hernia repair     BRONCHIAL NEEDLE ASPIRATION BIOPSY  04/05/2021   Procedure: BRONCHIAL NEEDLE ASPIRATION BIOPSIES;  Surgeon: Brenna Adine CROME, DO;  Location: [facility];  Service: Pulmonary;;   COLONOSCOPY  01/23/2012   Procedure: COLONOSCOPY;  Surgeon: Lamar HERO Hollingshead, MD;  Location: [facility];  Service: Endoscopy;  Laterality: N/A;  9:30 AM   COLONOSCOPY N/A 10/11/2015   Procedure: COLONOSCOPY;  Surgeon: Lamar HERO Hollingshead, MD;  Location: [facility];  Service: Endoscopy;  Laterality: N/A;  830   COLONOSCOPY N/A 10/14/2019   Procedure: COLONOSCOPY;  Surgeon: Hollingshead Lamar HERO, MD;  Location: [facility];  Service: Endoscopy;  Laterality: N/A;  1:45   HERNIA REPAIR     POLYPECTOMY  10/14/2019   Procedure: POLYPECTOMY;  Surgeon: Hollingshead Lamar HERO, MD;  Location: [facility];  Service: Endoscopy;;  ascending colon, descending colon   VIDEO BRONCHOSCOPY WITH ENDOBRONCHIAL ULTRASOUND N/A 04/05/2021   Procedure: VIDEO BRONCHOSCOPY WITH ENDOBRONCHIAL ULTRASOUND;  Surgeon: Brenna Adine CROME, DO;  Location: [facility];  Service: Pulmonary;  Laterality: N/A;    Social History   Socioeconomic History   Marital status: Married    Spouse name: Not on file   Number of children: Not on file   Years of education: Not on file   Highest  education level: Bachelor's degree (e.g., BA, AB, BS)  Occupational History   Not on file  Tobacco Use   Smoking status: Every Day    Current packs/day: 0.50    Average packs/day: 0.5 packs/day for 50.8 years (25.4 ttl pk-yrs)    Types: Cigarettes    Start date: 10/30/1973   Smokeless tobacco: Never  Vaping Use   Vaping status: Never Used  Substance and Sexual Activity   Alcohol use: Not Currently    Comment: One drink every 6 months.   Drug use: No   Sexual activity: Yes  Other Topics Concern   Not on file  Social History Narrative   Not on file   Social Drivers of Health   Financial Resource Strain: Low Risk  (07/22/2024)    Overall Financial Resource Strain (CARDIA)    Difficulty of Paying Living Expenses: Not very hard  Food Insecurity: No Food Insecurity (07/22/2024)   Hunger Vital Sign    Worried About Running Out of Food in the Last Year: Never true    Ran Out of Food in the Last Year: Never true  Transportation Needs: No Transportation Needs (07/22/2024)   PRAPARE - Administrator, Civil Service (Medical): No    Lack of Transportation (Non-Medical): No  Physical Activity: Unknown (04/19/2024)   Exercise Vital Sign    Days of Exercise per Week: 1 day    Minutes of Exercise per Session: Patient declined  Stress: No Stress Concern Present (04/19/2024)   [employer] of Occupational Health - Occupational Stress Questionnaire    Feeling of Stress: Not at all  Social Connections: Unknown (07/22/2024)   Social Connection and Isolation Panel    Frequency of Communication with Friends and Family: Patient declined    Frequency of Social Gatherings with Friends and Family: Patient declined    Attends Religious Services: Patient declined    Database Administrator or Organizations: Patient declined    Attends Engineer, Structural: Not on file    Marital Status: Married     FAMILY HISTORY:  We obtained a detailed, 4-generation family history.  Significant diagnoses are listed below: Family History  Problem Relation Age of Onset   Hypertension Mother    Diabetes Mother    Heart attack Mother    Heart disease Mother    Hypertension Father    Heart attack Father    COPD Father    Heart disease Father    Heart attack Brother    Heart disease Brother    Breast cancer Maternal Aunt    Cancer Maternal Aunt        uterine vs ovarain   Stroke Maternal Grandmother    Stroke Maternal Grandfather    Colon cancer Neg Hx      The patient has a son and daughter who are cancer free.  He has a brother who died of the flu.  His parents are both deceased.  The patient's father died of  COPD.  He had 4-5 siblings, but there is not a lot of information about this side of the family.  The patient's mother died of a heart attack in her 65's.  She had six siblings, one sister had breast cancer over age 54, and another sister either had uterine or ovarian cancer under age 51.  There is no other report of cancer.  Mr. Feister is unaware of previous family history of genetic testing for hereditary cancer risks. There is no reported Ashkenazi Jewish ancestry.  There is possibly known consanguinity.  GENETIC COUNSELING ASSESSMENT: Mr. Brabson is a 68 y.o. 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 We, therefore, discussed and recommended the following at today's visit.   DISCUSSION: We discussed that, in general, most cancer is not inherited in families, but instead is sporadic or familial. Sporadic cancers occur by chance and typically happen at older ages (>50 years) as this type of cancer is caused by genetic changes acquired during an individual's lifetime. Some families have more cancers than would be expected by chance; however, the ages or types of cancer are not consistent with a known genetic mutation or known genetic mutations have been ruled out. This type of familial cancer is thought to be due to a combination of multiple genetic, environmental, hormonal, and lifestyle factors. While this combination of factors likely increases the risk of cancer, the exact source of this risk is not currently identifiable or testable.  We reviewed that tumor testing is not meant to identify germline mutations, but instead to identify mutations within the tumor that can be targeted for treatment.  Sometimes, based on the gene and the VAF%, a mutation in the tumor can be germline.  An ATM mutation was identified in the lung tumor with a 26.6%  allele frequency.  ATM mutations are associated with an increased risk for male breast cancer and pancreatic cancer, as well as possible increased risk for prostate and colon cancer.    We reviewed the characteristics, features and inheritance patterns of hereditary cancer syndromes. We also discussed genetic testing, including the appropriate family members to test, the process of testing, insurance coverage and turn-around-time for results. We discussed the implications of a negative, positive, carrier and/or variant of uncertain significant result.  Mr. Ishler decided to pursue genetic testing for the Multi-cancer gene panel.   The Multi-Cancer + RNA Panel offered by [facility] includes sequencing and/or deletion/duplication analysis of the following 70 genes:  AIP*, ALK, APC*, ATM*, AXIN2*, BAP1*, BARD1*, BLM*, BMPR1A*, BRCA1*, BRCA2*, BRIP1*, CDC73*, CDH1*, CDK4, CDKN1B*, CDKN2A, CHEK2*, CTNNA1*, DICER1*, EPCAM (del/dup only), EGFR, FH*, FLCN*, GREM1 (promoter dup only), HOXB13, KIT, LZTR1, MAX*, MBD4, MEN1*, MET, MITF, MLH1*, MSH2*, MSH3*, MSH6*, MUTYH*, NF1*, NF2*, NTHL1*, PALB2*, PDGFRA, PMS2*, POLD1*, POLE*, POT1*, PRKAR1A*, PTCH1*, PTEN*, RAD51C*, RAD51D*, RB1*, RET, SDHA* (sequencing only), SDHAF2*, SDHB*, SDHC*, SDHD*, SMAD4*, SMARCA4*, SMARCB1*, SMARCE1*, STK11*, SUFU*, TMEM127*, TP53*, TSC1*, TSC2*, VHL*. RNA analysis is performed for * genes.   Based on Mr. Mcintire personal history of cancer with an ATM mutation identified in the tumor, he meets medical criteria for genetic testing. Despite that he meets criteria, he may still have an out of pocket cost. We discussed that if his out of pocket cost for testing is over $100, the laboratory will call and confirm whether he wants to proceed with testing.  If the out of pocket cost of testing is less than $100 he will be billed by the genetic testing laboratory.   PLAN: After considering the risks, benefits, and limitations, Mr. Minehart provided  informed consent to pursue genetic testing and the blood sample was sent to [facility] for analysis of the Multi-cancer + RNA panel. Results should be available within approximately 2-3 weeks' time, at which point they will be disclosed by telephone to Mr. Urey, as will any additional recommendations warranted by these results. Mr. Bergsma will  receive a summary of his genetic counseling visit and a copy of his results once available. This information will also be available in Epic.   Lastly, we encouraged Mr. Huguley to remain in contact with cancer genetics annually so that we can continuously update the family history and inform him of any changes in cancer genetics and testing that may be of benefit for this family.   Mr. Markson questions were answered to his satisfaction today. Our contact information was provided should additional questions or concerns arise. Thank you for the referral and allowing us  to share in the care of your patient.   Ginette Bradway P. Perri, MS, CGC Licensed, Patent Attorney [email or web address] phone: [phone]  I personally spent a total of 55 minutes in the care of the patient today including preparing to see the patient, getting/reviewing separately obtained history, counseling and educating, placing orders, and documenting clinical information in the EHR. The patient brought his wife. Drs. Lanny Stalls, and/or Gudena were available for questions, if needed..    _______________________________________________________________________ For Office Staff:  Number of people involved in session: 2 Was an Intern/ student involved with case: no

## 2024-08-31 ENCOUNTER — Inpatient Hospital Stay

## 2024-08-31 ENCOUNTER — Inpatient Hospital Stay: Admitting: Physician Assistant

## 2024-08-31 NOTE — Progress Notes (Signed)
 [facility] OFFICE PROGRESS NOTE  [name], FNP [address] [place] [address]  DIAGNOSIS: Stage IV (T1b, N3, M1C) non-small cell lung cancer, favoring adenocarcinoma presented with right upper lobe lung nodule in addition to right hilar, subcarinal and bilateral mediastinal as well as supraclavicular lymphadenopathy in addition to bone and brain metastasis diagnosed in June 2022.   PD-L1 expression 80%.     Molecular Studies:  Biomarker Findings Microsatellite status - MS-Stable Tumor Mutational Burden - 6 Muts/Mb Genomic Findings For a complete list of the genes assayed, please refer to the Appendix. KRAS G12C, amplification ATM S470* CCND1 amplification - equivocal? HGF amplification - equivocal? MYC amplification - equivocal? FGF19 amplification - equivocal? FGF3 amplification - equivocal? FGF4 amplification - equivocal? NFKBIA amplification NKX2-1 amplification RAD21 amplification - equivocal? [id number]*26 TERT promoter -124C>T TP53 rearrangement exon 9 7 Disease relevant genes with no reportable alterations: ALK, BRAF, EGFR, ERBB2, MET, RET, ROS1   PRIOR THERAPY: SRS to the metastatic brain lesions under the care of Dr. Patrcia.  Last treatment on 05/04/2021.   CURRENT THERAPY: Palliative systemic chemotherapy with carboplatin  for an AUC 5, Alimta  500 mg/m2 and, Keytruda  200 mg IV every 3 weeks.  First dose expected on 05/08/2021.  Status post 56 cycles.  Starting from cycle #5 he is on maintenance treatment with Alimta  and Keytruda  every 3 weeks.    INTERVAL HISTORY: Emily Forse Cuoco 68 y.o. male returns to the clinic today for a follow-up visit accompanied by his wife. The patient was last seen by myself 3 weeks ago. He is currently on treatment with Alimta  and keytruda .     He has a history of pericardial effusion, which was noted to be moderate in amount on his recent echocardiogram. He is under regular monitoring for this condition.  He is currently on a tapering dose of NSAIDs. He has a follow up scheduled for January 2026.    He bruises easily, attributing it to sensitivity. He also experiences swelling in his hands, which he manages by elevating them and has some compression sleeves at home.   No current fevers, chills, night sweats, shortness of breath, unusual cough, chest pain, or changes in appetite.   He mentions that his teeth are breaking off, and there is a need to coordinate dental work around his treatment schedule.  He plans on retiring soon.     He is on steroids for adrenal insufficiency. He is followed by endocrinology. He also has hypothyroidism and hypogonadism. He is not interested in a treatment break. His next brain MRI is on 09/11/24. He is here today for evaluation and repeat blood work before undergoing cycle #57   MEDICAL HISTORY: Past Medical History:  Diagnosis Date   Brain cancer (HCC)    Cervical spondylolysis    Depression    Essential hypertension    Family history of breast cancer    GERD (gastroesophageal reflux disease)    History of kidney stones    History of migraine    Hyperlipidemia    Hypertension    lung ca with mets to brain    Lung cancer (HCC)    PONV (postoperative nausea and vomiting)    Type 2 diabetes mellitus (HCC)     ALLERGIES:  has no known allergies.  MEDICATIONS:  Current Outpatient Medications  Medication Sig Dispense Refill   acetaminophen  (TYLENOL ) 500 MG tablet Take 500 mg by mouth every 6 (six) hours as needed for mild pain (pain score 1-3) or  fever.     chlorproMAZINE  (THORAZINE ) 10 MG tablet Take 1 tablet (10 mg total) by mouth 3 (three) times daily as needed for hiccoughs. 90 tablet 0   cholecalciferol  (VITAMIN D ) 1000 UNITS tablet Take 1,000 Units by mouth every evening.     colchicine  0.6 MG tablet Take 1 tablet (0.6 mg total) by mouth 2 (two) times daily. 180 tablet 0   Continuous Glucose Sensor (FREESTYLE LIBRE 3 PLUS SENSOR) MISC Change  sensor every 15 days. 6 each 3   dexamethasone  (DECADRON ) 1 MG tablet Take 1 tablet (1 mg total) by mouth daily with breakfast. 90 tablet 1   famotidine  (PEPCID ) 10 MG tablet Take 10 mg by mouth at bedtime. Chewable     folic acid  (FOLVITE ) 1 MG tablet Take 1 tablet (1 mg total) by mouth daily. 90 tablet 0   ibuprofen  (ADVIL ) 200 MG tablet Take 4 tablets (800 mg total) by mouth 3 (three) times daily for 14 days, THEN 3 tablets (600 mg total) 3 (three) times daily for 7 days, THEN 2 tablets (400 mg total) 3 (three) times daily for 7 days, THEN 1 tablet (200 mg total) 3 (three) times daily for 7 days. 294 tablet 0   Krill Oil 300 MG CAPS Take 300 mg by mouth every evening.     levothyroxine  (SYNTHROID ) 112 MCG tablet Take 1 tablet (112 mcg total) by mouth daily before breakfast. 90 tablet 1   loratadine  (CLARITIN ) 10 MG tablet Take 10 mg by mouth every evening.      mirtazapine  (REMERON ) 15 MG tablet Take 1 tablet (15 mg total) by mouth at bedtime. 90 tablet 1   Multiple Vitamins-Minerals (ONE A DAY MEN 50 PLUS) TABS Take 1 tablet by mouth at bedtime.     ondansetron  (ZOFRAN ) 4 MG tablet Take 2 tablets (8 mg total) by mouth every 8 (eight) hours as needed for nausea. 60 tablet 0   pantoprazole  (PROTONIX ) 40 MG tablet Take 1 tablet (40 mg total) by mouth every evening. 90 tablet 0   polyethylene glycol powder (GLYCOLAX /MIRALAX ) 17 GM/SCOOP powder Take 119 g by mouth daily as needed for mild constipation or moderate constipation.     rosuvastatin  (CRESTOR ) 20 MG tablet Take 1 tablet (20 mg total) by mouth daily. 90 tablet 0   SYRINGE-NEEDLE, DISP, 3 ML (B-D 3CC LUER-LOK SYR 21GX1-1/2) 21G X 1-1/2 3 ML MISC Use to inject testosterone  every week 100 each 2   testosterone  cypionate (DEPOTESTOTERONE CYPIONATE) 100 MG/ML injection Take 50mg  ( 0.105ml) and 100mg  ( 1ml) into muscle alternatively weekly. 10 mL 1   venlafaxine  XR (EFFEXOR -XR) 75 MG 24 hr capsule Take 2 capsules (150 mg total) by mouth daily with  breakfast. (Patient taking differently: Take 150 mg by mouth at bedtime.) 180 capsule 2   No current facility-administered medications for this visit.    SURGICAL HISTORY:  Past Surgical History:  Procedure Laterality Date   Bilateral inguinal hernia repair     BRONCHIAL NEEDLE ASPIRATION BIOPSY  04/05/2021   Procedure: BRONCHIAL NEEDLE ASPIRATION BIOPSIES;  Surgeon: Brenna Adine CROME, DO;  Location: [facility];  Service: Pulmonary;;   COLONOSCOPY  01/23/2012   Procedure: COLONOSCOPY;  Surgeon: Lamar CHRISTELLA Hollingshead, MD;  Location: [facility];  Service: Endoscopy;  Laterality: N/A;  9:30 AM   COLONOSCOPY N/A 10/11/2015   Procedure: COLONOSCOPY;  Surgeon: Lamar CHRISTELLA Hollingshead, MD;  Location: [facility];  Service: Endoscopy;  Laterality: N/A;  830   COLONOSCOPY N/A 10/14/2019  Procedure: COLONOSCOPY;  Surgeon: Shaaron Lamar HERO, MD;  Location: [facility];  Service: Endoscopy;  Laterality: N/A;  1:45   HERNIA REPAIR     POLYPECTOMY  10/14/2019   Procedure: POLYPECTOMY;  Surgeon: Shaaron Lamar HERO, MD;  Location: [facility];  Service: Endoscopy;;  ascending colon, descending colon   VIDEO BRONCHOSCOPY WITH ENDOBRONCHIAL ULTRASOUND N/A 04/05/2021   Procedure: VIDEO BRONCHOSCOPY WITH ENDOBRONCHIAL ULTRASOUND;  Surgeon: Brenna Adine CROME, DO;  Location: [facility];  Service: Pulmonary;  Laterality: N/A;    REVIEW OF SYSTEMS:   Review of Systems  Constitutional: Positive for fatigue. Negative for appetite change, chills, fever and unexpected weight change.  HENT: Negative for mouth sores, nosebleeds, sore throat and trouble swallowing.   Eyes: Negative for eye problems and icterus.  Respiratory: Negative for cough, hemoptysis, shortness of breath and wheezing.   Cardiovascular: Negative for chest pain. Positive for intermittent swelling.  Gastrointestinal: Negative for abdominal pain, constipation, diarrhea, nausea and vomiting.  Genitourinary: Negative for bladder incontinence, difficulty  urinating, dysuria, frequency and hematuria.   Musculoskeletal: Negative for back pain, gait problem, neck pain and neck stiffness.  Skin: Negative for itching and rash. Positive for easy bruising.  Neurological: Negative for dizziness, extremity weakness, gait problem, headaches, light-headedness and seizures.  Hematological: Negative for adenopathy. Does not bruise/bleed easily.  Psychiatric/Behavioral: Negative for confusion, depression and sleep disturbance. The patient is not nervous/anxious.     PHYSICAL EXAMINATION:  Blood pressure (!) 150/90, pulse 98, temperature 97.6 F (36.4 C), temperature source Temporal, resp. rate 18, height 5' 0.5 (1.537 m), weight 197 lb (89.4 kg), SpO2 98%.  ECOG PERFORMANCE STATUS: 1  Physical Exam  Constitutional: Oriented to person, place, and time and well-developed, well-nourished, and in no distress.  HENT:  Head: Normocephalic and atraumatic.  Mouth/Throat: Oropharynx is clear and moist. No oropharyngeal exudate.  Eyes: Conjunctivae are normal. Right eye exhibits no discharge. Left eye exhibits no discharge. No scleral icterus.  Neck: Normal range of motion. Neck supple.  Cardiovascular: Normal rate, regular rhythm, normal heart sounds and intact distal pulses.   Pulmonary/Chest: Effort normal and breath sounds normal. No respiratory distress. No wheezes. No rales.  Abdominal: Soft. Bowel sounds are normal. Exhibits no distension and no mass. There is no tenderness.  Musculoskeletal: Normal range of motion. Exhibits no edema.  Lymphadenopathy:    No cervical adenopathy.  Neurological: Alert and oriented to person, place, and time. Exhibits normal muscle tone. Gait normal. Coordination normal.  Skin: Skin is warm and dry. Positive for bruising. No rash noted. Not diaphoretic. No erythema. No pallor.  Psychiatric: Mood, memory and judgment normal.  Vitals reviewed.  LABORATORY DATA: Lab Results  Component Value Date   WBC 12.8 (H) 09/08/2024    HGB 10.1 (L) 09/08/2024   HCT 35.1 (L) 09/08/2024   MCV 81.6 09/08/2024   PLT 334 09/08/2024      Chemistry      Component Value Date/Time   NA 143 09/08/2024 1255   NA 145 (H) 04/22/2024 0917   K 3.6 09/08/2024 1255   CL 105 09/08/2024 1255   CO2 27 09/08/2024 1255   BUN 12 09/08/2024 1255   BUN 10 04/22/2024 0917   CREATININE 1.02 09/08/2024 1255      Component Value Date/Time   CALCIUM  9.2 09/08/2024 1255   ALKPHOS 71 09/08/2024 1255   AST 24 09/08/2024 1255   ALT 21 09/08/2024 1255   BILITOT 0.2 09/08/2024 1255       RADIOGRAPHIC  STUDIES:  No results found.    ASSESSMENT/PLAN:  This is a very pleasant 68 year old Caucasian male diagnosed with stage IV (T1b, N3, M1 C) non-small cell lung cancer, favoring adenocarcinoma.  The patient presented with a right upper lobe lung nodule in addition to right hilar, subcarinal, and bilateral mediastinal lymphadenopathy as well as supraclavicular lymphadenopathy.  The patient also has metastatic disease to the brain.  He was diagnosed in June 2022.  His PD-L1 expression is 80%.  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    The patient completed SRS to the brain lesions under the care of Dr. Patrcia.  His last treatment was on 05/04/2021. 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     The patient is currently undergoing systemic chemotherapy with carboplatin  for an AUC of 5, Alimta  500 mg per metered squared, Keytruda  200 mg IV every 3 weeks.  Status post 56 cycles. Starting from cycle #5, the patient started maintenance Alimta  and Keytruda  IV every 3 weeks.     Labs were reviewed.  Recommend that he proceed with cycle #57 today as scheduled.     We will see him back for follow-up visit in 3 weeks for evaluation repeat blood work before undergoing cycle #58.  I will arrange for a  restaging CT scan of the CAP prior to the next cycle of treatment.   He mentions he needs dental work. He was advised to call us  when he knows he is requiring dental work and we can coordinate this between his infusion schedule.     Pericardial effusion Moderate pericardial effusion noted on echocardiogram, improved from previous assessment. Cardiologist monitoring condition and the patient is currently on NSAID taper.  - Continue monitoring pericardial effusion with cardiologist. He is expected to have a follow up in January 2026.    Edema and easy bruising of hands Discussed use of compression gloves to manage swelling. - Use compression gloves to manage swelling. - Elevate hands when possible.  He is scheduled for repeat brain MRI on 09/11/24  Adrenal insufficiency on steroid replacement Stable on steroid replacement therapy. - Continue current steroid replacement therapy.  The patient was advised to call immediately if he has any concerning symptoms in the interval. The patient voices understanding of current disease status and treatment options and is in agreement with the current care plan. All questions were answered. The patient knows to call the clinic with any problems, questions or concerns. We can certainly see the patient much sooner if necessary   Orders Placed This Encounter  Procedures   CT CHEST ABDOMEN PELVIS W CONTRAST    Standing Status:   Future    Expected Date:   09/22/2024    Expiration Date:   09/08/2025    If indicated for the ordered procedure, I authorize the administration of contrast media per Radiology protocol:   Yes    Does the patient have a contrast media/X-ray dye allergy?:   No    Preferred imaging location?:   [facility]    If indicated for the ordered procedure, I authorize the administration of oral contrast media per Radiology protocol:   Yes     The total time spent in the appointment was 20-29 minutes  Chaitra Mast L Castella Lerner,  PA-C 09/08/24

## 2024-09-02 ENCOUNTER — Telehealth: Payer: Self-pay | Admitting: Genetic Counselor

## 2024-09-02 ENCOUNTER — Encounter: Payer: Self-pay | Admitting: Genetic Counselor

## 2024-09-02 ENCOUNTER — Ambulatory Visit: Payer: Self-pay | Admitting: Genetic Counselor

## 2024-09-02 DIAGNOSIS — Z1379 Encounter for other screening for genetic and chromosomal anomalies: Secondary | ICD-10-CM | POA: Insufficient documentation

## 2024-09-02 NOTE — Telephone Encounter (Signed)
 I contacted  Bradley Goodman to discuss his genetic testing results. No pathogenic variants were identified in the 70 genes analyzed. Discussed that we do not know why he has lung cancer or why there is cancer in the family. It could be due to a different gene that we are not testing, or maybe our current technology may not be able to pick something up.  It will be important for him to keep in contact with genetics to keep up with whether additional testing may be needed.Detailed clinic note to follow.   The test report will be scanned into EPIC and will be located under the Molecular Pathology section of the Results Review tab.  A portion of the result report is included below for reference.

## 2024-09-02 NOTE — Telephone Encounter (Signed)
 Patient could not take the results call at this time.  He will call back when he is available.

## 2024-09-02 NOTE — Progress Notes (Signed)
 HPI:  Bradley Goodman was previously seen in the [facility] due to a personal and family history of cancer and concerns regarding a hereditary predisposition to cancer. Please refer to our prior cancer genetics clinic note for more information regarding our discussion, assessment and recommendations, at the time. Bradley Goodman recent genetic test results were disclosed to him, as were recommendations warranted by these results. These results and recommendations are discussed in more detail below.  CANCER HISTORY:  Oncology History  Adenocarcinoma of right lung, stage 4 (HCC)  04/25/2021 Initial Diagnosis   Adenocarcinoma of right lung, stage 4 (HCC)   04/25/2021 Cancer Staging   Staging form: Lung, AJCC 8th Edition - Clinical: Stage IVB (cT1b, cN3, cM1c) - Signed by Sherrod Sherrod, MD on 04/25/2021   05/08/2021 -  Chemotherapy   Patient is on Treatment Plan : LUNG Carboplatin  (5) + Pemetrexed  (500) + Pembrolizumab  (200) D1 q21d Induction x 4 cycles / Maintenance Pemetrexed  (500) + Pembrolizumab  (200) D1 q21d       FAMILY HISTORY:  We obtained a detailed, 4-generation family history.  Significant diagnoses are listed below: Family History  Problem Relation Age of Onset   Hypertension Mother    Diabetes Mother    Heart attack Mother    Heart disease Mother    Hypertension Father    Heart attack Father    COPD Father    Heart disease Father    Heart attack Brother    Heart disease Brother    Breast cancer Maternal Aunt    Cancer Maternal Aunt        uterine vs ovarain   Stroke Maternal Grandmother    Stroke Maternal Grandfather    Colon cancer Neg Hx        The patient has a son and daughter who are cancer free.  He has a brother who died of the flu.  His parents are both deceased.   The patient's father died of COPD.  He had 4-5 siblings, but there is not a lot of information about this side of the family.   The patient's mother died of a heart attack in her  32's.  She had six siblings, one sister had breast cancer over age 3, and another sister either had uterine or ovarian cancer under age 12.  There is no other report of cancer.   Bradley Goodman is unaware of previous family history of genetic testing for hereditary cancer risks. There is no reported Ashkenazi Jewish ancestry. There is possibly known consanguinity.  GENETIC TEST RESULTS: Genetic testing reported out on August 31, 2024 through the Multi-Cancer+RNA cancer panel found no pathogenic mutations. The Multi-Cancer + RNA Panel offered by [facility] includes sequencing and/or deletion/duplication analysis of the following 70 genes:  AIP*, ALK, APC*, ATM*, AXIN2*, BAP1*, BARD1*, BLM*, BMPR1A*, BRCA1*, BRCA2*, BRIP1*, CDC73*, CDH1*, CDK4, CDKN1B*, CDKN2A, CHEK2*, CTNNA1*, DICER1*, EPCAM (del/dup only), EGFR, FH*, FLCN*, GREM1 (promoter dup only), HOXB13, KIT, LZTR1, MAX*, MBD4, MEN1*, MET, MITF, MLH1*, MSH2*, MSH3*, MSH6*, MUTYH*, NF1*, NF2*, NTHL1*, PALB2*, PDGFRA, PMS2*, POLD1*, POLE*, POT1*, PRKAR1A*, PTCH1*, PTEN*, RAD51C*, RAD51D*, RB1*, RET, SDHA* (sequencing only), SDHAF2*, SDHB*, SDHC*, SDHD*, SMAD4*, SMARCA4*, SMARCB1*, SMARCE1*, STK11*, SUFU*, TMEM127*, TP53*, TSC1*, TSC2*, VHL*. RNA analysis is performed for * genes. The test report has been scanned into EPIC and is located under the Molecular Pathology section of the Results Review tab.  A portion of the result report is included below for reference.     We discussed with Mr.  Goodman that because current genetic testing is not perfect, it is possible there may be a gene mutation in one of these genes that current testing cannot detect, but that chance is small.  We also discussed, that there could be another gene that has not yet been discovered, or that we have not yet tested, that is responsible for the cancer diagnoses in the family. It is also possible there is a hereditary cause for the cancer in the family that Mr. Juba did not inherit and  therefore was not identified in his testing.  Therefore, it is important to remain in touch with cancer genetics in the future so that we can continue to offer Bradley Goodman the most up to date genetic testing.   Genetic testing did identify a variant of uncertain significance (VUS) was identified in the PDGFRA gene called [id number].  At this time, it is unknown if this variant is associated with increased cancer risk or if this is a normal finding, but most variants such as this get reclassified to being inconsequential. It should not be used to make medical management decisions. With time, we suspect the lab will determine the significance of this variant, if any. If we do learn more about it, we will try to contact Bradley Goodman to discuss it further. However, it is important to stay in touch with us  periodically and keep the address and phone number up to date.  ADDITIONAL GENETIC TESTING: We discussed with Bradley Goodman that his genetic testing was fairly extensive.  If there are genes identified to increase cancer risk that can be analyzed in the future, we would be happy to discuss and coordinate this testing at that time.    CANCER SCREENING RECOMMENDATIONS: Bradley Goodman test result is considered negative (normal).  This means that we have not identified a hereditary cause for his personal and family history of cancer at this time. Most cancers happen by chance and this negative test suggests that his personal and family history of cancer may fall into this category.    Possible reasons for Bradley Goodman negative genetic test include:  1. There may be a gene mutation in one of these genes that current testing methods cannot detect but that chance is small.  2. There could be another gene that has not yet been discovered, or that we have not yet tested, that is responsible for the cancer diagnoses in the family.  3.  There may be no hereditary risk for cancer in the family. The cancers in Bradley Goodman and/or his  family may be sporadic/familial or due to other genetic and environmental factors. 4. It is also possible there is a hereditary cause for the cancer in the family that Mr. Iser did not inherit.  Therefore, it is recommended he continue to follow the cancer management and screening guidelines provided by his oncology and primary healthcare provider. An individual's cancer risk and medical management are not determined by genetic test results alone. Overall cancer risk assessment incorporates additional factors, including personal medical history, family history, and any available genetic information that may result in a personalized plan for cancer prevention and surveillance  RECOMMENDATIONS FOR FAMILY MEMBERS:   Since he did not inherit a identifiable mutation in a cancer predisposition gene included on this panel, his children could not have inherited a known mutation from his in one of these genes. Individuals in this family might be at some increased risk of developing cancer, over the general population risk, simply due  to the family history of cancer.  We recommended women in this family have a yearly mammogram beginning at age 84, or 9 years younger than the earliest onset of cancer, an annual clinical breast exam, and perform monthly breast self-exams. Women in this family should also have a gynecological exam as recommended by their primary provider. All family members should be referred for colonoscopy starting at age 24, or 7 years younger than the earliest   FOLLOW-UP: Lastly, we discussed with Mr. Fandrich that cancer genetics is a rapidly advancing field and it is possible that new genetic tests will be appropriate for him and/or his family members in the future. We encouraged him to remain in contact with cancer genetics on an annual basis so we can update his personal and family histories and let him know of advances in cancer genetics that may benefit this family.   Our contact number was  provided. Mr. Dunshee questions were answered to his satisfaction, and he knows he is welcome to call us  at anytime with additional questions or concerns.   Darice Monte, MS, [facility] Licensed, Certified Genetic Counselor [email or web address]

## 2024-09-05 ENCOUNTER — Other Ambulatory Visit: Payer: Self-pay | Admitting: Family Medicine

## 2024-09-05 DIAGNOSIS — E1165 Type 2 diabetes mellitus with hyperglycemia: Secondary | ICD-10-CM

## 2024-09-07 ENCOUNTER — Other Ambulatory Visit (HOSPITAL_COMMUNITY): Payer: Self-pay

## 2024-09-07 ENCOUNTER — Other Ambulatory Visit: Payer: Self-pay

## 2024-09-07 MED ORDER — FREESTYLE LIBRE 3 PLUS SENSOR MISC
3 refills | Status: AC
  Filled 2024-09-07: qty 2, 30d supply, fill #0
  Filled 2024-10-02: qty 2, 30d supply, fill #1
  Filled 2024-11-03: qty 2, 30d supply, fill #2

## 2024-09-08 ENCOUNTER — Inpatient Hospital Stay (HOSPITAL_BASED_OUTPATIENT_CLINIC_OR_DEPARTMENT_OTHER): Admitting: Physician Assistant

## 2024-09-08 ENCOUNTER — Inpatient Hospital Stay: Attending: Physician Assistant

## 2024-09-08 ENCOUNTER — Encounter (INDEPENDENT_AMBULATORY_CARE_PROVIDER_SITE_OTHER): Payer: Self-pay | Admitting: *Deleted

## 2024-09-08 ENCOUNTER — Inpatient Hospital Stay

## 2024-09-08 ENCOUNTER — Encounter: Payer: Self-pay | Admitting: Internal Medicine

## 2024-09-08 VITALS — BP 150/90 | HR 98 | Temp 97.6°F | Resp 18 | Ht 60.5 in | Wt 197.0 lb

## 2024-09-08 DIAGNOSIS — C7951 Secondary malignant neoplasm of bone: Secondary | ICD-10-CM | POA: Diagnosis not present

## 2024-09-08 DIAGNOSIS — C7931 Secondary malignant neoplasm of brain: Secondary | ICD-10-CM | POA: Diagnosis not present

## 2024-09-08 DIAGNOSIS — C3491 Malignant neoplasm of unspecified part of right bronchus or lung: Secondary | ICD-10-CM | POA: Diagnosis not present

## 2024-09-08 DIAGNOSIS — Z79899 Other long term (current) drug therapy: Secondary | ICD-10-CM | POA: Insufficient documentation

## 2024-09-08 DIAGNOSIS — E039 Hypothyroidism, unspecified: Secondary | ICD-10-CM | POA: Insufficient documentation

## 2024-09-08 DIAGNOSIS — I3139 Other pericardial effusion (noninflammatory): Secondary | ICD-10-CM | POA: Diagnosis not present

## 2024-09-08 DIAGNOSIS — F17210 Nicotine dependence, cigarettes, uncomplicated: Secondary | ICD-10-CM | POA: Diagnosis not present

## 2024-09-08 DIAGNOSIS — Z79890 Hormone replacement therapy (postmenopausal): Secondary | ICD-10-CM | POA: Diagnosis not present

## 2024-09-08 DIAGNOSIS — C3411 Malignant neoplasm of upper lobe, right bronchus or lung: Secondary | ICD-10-CM | POA: Insufficient documentation

## 2024-09-08 DIAGNOSIS — Z5112 Encounter for antineoplastic immunotherapy: Secondary | ICD-10-CM | POA: Insufficient documentation

## 2024-09-08 DIAGNOSIS — Z7952 Long term (current) use of systemic steroids: Secondary | ICD-10-CM | POA: Insufficient documentation

## 2024-09-08 DIAGNOSIS — Z5111 Encounter for antineoplastic chemotherapy: Secondary | ICD-10-CM

## 2024-09-08 DIAGNOSIS — R609 Edema, unspecified: Secondary | ICD-10-CM | POA: Insufficient documentation

## 2024-09-08 DIAGNOSIS — E230 Hypopituitarism: Secondary | ICD-10-CM | POA: Insufficient documentation

## 2024-09-08 DIAGNOSIS — E2740 Unspecified adrenocortical insufficiency: Secondary | ICD-10-CM | POA: Insufficient documentation

## 2024-09-08 LAB — CBC WITH DIFFERENTIAL (CANCER CENTER ONLY)
Abs Immature Granulocytes: 0.07 K/uL (ref 0.00–0.07)
Basophils Absolute: 0.1 K/uL (ref 0.0–0.1)
Basophils Relative: 1 %
Eosinophils Absolute: 0 K/uL (ref 0.0–0.5)
Eosinophils Relative: 0 %
HCT: 35.1 % — ABNORMAL LOW (ref 39.0–52.0)
Hemoglobin: 10.1 g/dL — ABNORMAL LOW (ref 13.0–17.0)
Immature Granulocytes: 1 %
Lymphocytes Relative: 10 %
Lymphs Abs: 1.2 K/uL (ref 0.7–4.0)
MCH: 23.5 pg — ABNORMAL LOW (ref 26.0–34.0)
MCHC: 28.8 g/dL — ABNORMAL LOW (ref 30.0–36.0)
MCV: 81.6 fL (ref 80.0–100.0)
Monocytes Absolute: 0.6 K/uL (ref 0.1–1.0)
Monocytes Relative: 4 %
Neutro Abs: 10.8 K/uL — ABNORMAL HIGH (ref 1.7–7.7)
Neutrophils Relative %: 84 %
Platelet Count: 334 K/uL (ref 150–400)
RBC: 4.3 MIL/uL (ref 4.22–5.81)
RDW: 20.4 % — ABNORMAL HIGH (ref 11.5–15.5)
WBC Count: 12.8 K/uL — ABNORMAL HIGH (ref 4.0–10.5)
nRBC: 0 % (ref 0.0–0.2)

## 2024-09-08 LAB — CMP (CANCER CENTER ONLY)
ALT: 21 U/L (ref 0–44)
AST: 24 U/L (ref 15–41)
Albumin: 4 g/dL (ref 3.5–5.0)
Alkaline Phosphatase: 71 U/L (ref 38–126)
Anion gap: 11 (ref 5–15)
BUN: 12 mg/dL (ref 8–23)
CO2: 27 mmol/L (ref 22–32)
Calcium: 9.2 mg/dL (ref 8.9–10.3)
Chloride: 105 mmol/L (ref 98–111)
Creatinine: 1.02 mg/dL (ref 0.61–1.24)
GFR, Estimated: >60 mL/min (ref >60)
Glucose, Bld: 95 mg/dL (ref 70–99)
Potassium: 3.6 mmol/L (ref 3.5–5.1)
Sodium: 143 mmol/L (ref 135–145)
Total Bilirubin: 0.2 mg/dL (ref 0.0–1.2)
Total Protein: 7 g/dL (ref 6.5–8.1)

## 2024-09-08 MED ORDER — SODIUM CHLORIDE 0.9 % IV SOLN
200.0000 mg | Freq: Once | INTRAVENOUS | Status: AC
  Administered 2024-09-08: 200 mg via INTRAVENOUS
  Filled 2024-09-08: qty 200

## 2024-09-08 MED ORDER — PROCHLORPERAZINE MALEATE 10 MG PO TABS
10.0000 mg | ORAL_TABLET | Freq: Once | ORAL | Status: AC
  Administered 2024-09-08: 10 mg via ORAL
  Filled 2024-09-08: qty 1

## 2024-09-08 MED ORDER — SODIUM CHLORIDE 0.9 % IV SOLN
500.0000 mg/m2 | Freq: Once | INTRAVENOUS | Status: AC
  Administered 2024-09-08: 1000 mg via INTRAVENOUS
  Filled 2024-09-08: qty 40

## 2024-09-08 MED ORDER — CYANOCOBALAMIN 1000 MCG/ML IJ SOLN
1000.0000 ug | Freq: Once | INTRAMUSCULAR | Status: AC
  Administered 2024-09-08: 1000 ug via INTRAMUSCULAR
  Filled 2024-09-08: qty 1

## 2024-09-08 MED ORDER — SODIUM CHLORIDE 0.9 % IV SOLN: Freq: Once | INTRAVENOUS | Status: AC

## 2024-09-08 NOTE — Patient Instructions (Signed)
 [facility] - A DEPT OF [facility]  Discharge Instructions: Thank you for choosing [facility] to provide your oncology and hematology care.   If you have a lab appointment with the Cancer Center, please go directly to the Cancer Center and check in at the registration area.   Wear comfortable clothing and clothing appropriate for easy access to any Portacath or PICC line.   We strive to give you quality time with your provider. You may need to reschedule your appointment if you arrive late (15 or more minutes).  Arriving late affects you and other patients whose appointments are after yours.  Also, if you miss three or more appointments without notifying the office, you may be dismissed from the clinic at the provider's discretion.      For prescription refill requests, have your pharmacy contact our office and allow 72 hours for refills to be completed.    Today you received the following chemotherapy and/or immunotherapy agents: Pembrolizumab  (Keytruda ), Alimta  (Pemetrexed )       To help prevent nausea and vomiting after your treatment, we encourage you to take your nausea medication as directed.  BELOW ARE SYMPTOMS THAT SHOULD BE REPORTED IMMEDIATELY: *FEVER GREATER THAN 100.4 F (38 C) OR HIGHER *CHILLS OR SWEATING *NAUSEA AND VOMITING THAT IS NOT CONTROLLED WITH YOUR NAUSEA MEDICATION *UNUSUAL SHORTNESS OF BREATH *UNUSUAL BRUISING OR BLEEDING *URINARY PROBLEMS (pain or burning when urinating, or frequent urination) *BOWEL PROBLEMS (unusual diarrhea, constipation, pain near the anus) TENDERNESS IN MOUTH AND THROAT WITH OR WITHOUT PRESENCE OF ULCERS (sore throat, sores in mouth, or a toothache) UNUSUAL RASH, SWELLING OR PAIN  UNUSUAL VAGINAL DISCHARGE OR ITCHING   Items with * indicate a potential emergency and should be followed up as soon as possible or go to the Emergency Department if any problems should occur.  Please show the  CHEMOTHERAPY ALERT CARD or IMMUNOTHERAPY ALERT CARD at check-in to the Emergency Department and triage nurse.  Should you have questions after your visit or need to cancel or reschedule your appointment, please contact [facility] - A DEPT OF [facility]  Dept: [phone]  and follow the prompts.  Office hours are 8:00 a.m. to 4:30 p.m. Monday - Friday. Please note that voicemails left after 4:00 p.m. may not be returned until the following business day.  We are closed weekends and major holidays. You have access to a nurse at all times for urgent questions. Please call the main number to the clinic Dept: [phone] and follow the prompts.   For any non-urgent questions, you may also contact your provider using MyChart. We now offer e-Visits for anyone 68 and older to request care online for non-urgent symptoms. For details visit [email or web address].   Also download the MyChart app! Go to the app store, search MyChart, open the app, select [place], and log in with your MyChart username and password.

## 2024-09-11 ENCOUNTER — Inpatient Hospital Stay: Admission: RE | Admit: 2024-09-11 | Discharge: 2024-09-11 | Attending: Internal Medicine

## 2024-09-11 DIAGNOSIS — Z8589 Personal history of malignant neoplasm of other organs and systems: Secondary | ICD-10-CM

## 2024-09-11 DIAGNOSIS — R9082 White matter disease, unspecified: Secondary | ICD-10-CM | POA: Diagnosis not present

## 2024-09-11 MED ORDER — GADOPICLENOL 0.5 MMOL/ML IV SOLN
9.0000 mL | Freq: Once | INTRAVENOUS | Status: AC | PRN
  Administered 2024-09-11: 9 mL via INTRAVENOUS

## 2024-09-14 ENCOUNTER — Encounter

## 2024-09-17 ENCOUNTER — Ambulatory Visit: Admitting: Internal Medicine

## 2024-09-17 VITALS — BP 151/93 | HR 111 | Temp 98.1°F | Resp 16 | Ht 71.5 in | Wt 194.0 lb

## 2024-09-17 DIAGNOSIS — C3491 Malignant neoplasm of unspecified part of right bronchus or lung: Secondary | ICD-10-CM | POA: Diagnosis not present

## 2024-09-17 DIAGNOSIS — Z5112 Encounter for antineoplastic immunotherapy: Secondary | ICD-10-CM | POA: Diagnosis not present

## 2024-09-17 DIAGNOSIS — C7931 Secondary malignant neoplasm of brain: Secondary | ICD-10-CM | POA: Diagnosis not present

## 2024-09-17 NOTE — Progress Notes (Signed)
 [facility] at [facility] [address]  [place], [place] [address] [phone]   Interval Evaluation  Date of Service: 09/17/2024 Patient Name: Bradley Goodman Patient MRN: [id number] Patient DOB: 02/27/1956 Provider: Arthea MARLA Manns, MD  Identifying Statement:  Bradley Goodman is a 68 y.o. male with Adenocarcinoma of right lung, stage 4 (HCC)  Metastasis to brain [facility]) - Plan: MR BRAIN W WO CONTRAST  Secondary malignant neoplasm of brain (HCC)   Primary Cancer:  Oncologic History: Oncology History  Adenocarcinoma of right lung, stage 4 (HCC)  04/25/2021 Initial Diagnosis   Adenocarcinoma of right lung, stage 4 (HCC)   04/25/2021 Cancer Staging   Staging form: Lung, AJCC 8th Edition - Clinical: Stage IVB (cT1b, cN3, cM1c) - Signed by Sherrod Sherrod, MD on 04/25/2021   05/08/2021 -  Chemotherapy   Patient is on Treatment Plan : LUNG Carboplatin  (5) + Pemetrexed  (500) + Pembrolizumab  (200) D1 q21d Induction x 4 cycles / Maintenance Pemetrexed  (500) + Pembrolizumab  (200) D1 q21d      CNS Oncologic History 05/04/21: Radiosurgery to 4 brain metastases, 5 fractions to pituitary lesion  Interval History: Bradley Goodman presents today for follow up after recent MRI brain.  Denies any clinical changes or neurologic changes today.  No issues with visual impairment as prior.  Remains active with his garden, golfing.  Denies seizures and headaches.  Continues on decadron  1mg  daily through endocrinology.  H+P (05/23/21) Patient presented to medical attention initially on 03/27/21 with new onset left eyelid drooping.  CNS imaging was obtained, which demonstrated multiple enhancing lesions consistent with brain metastases.  Systemic workup demonstrated lung cancer, which was confirmed through endobronchial biopsy.  He underwent single fraction radiosurgery to brain lesions, and fractionated radiosurgery to pituitary lesion.  Following radiation, eyelid drooping  improved, but he developed tunnel vision visual deficits, described as like looking through plastic.  This has been more or less static over the past week or so, though it did improve transiently after dosing decadron  with chemotherapy on 05/08/21.   Medications: Current Outpatient Medications on File Prior to Visit  Medication Sig Dispense Refill   acetaminophen  (TYLENOL ) 500 MG tablet Take 500 mg by mouth every 6 (six) hours as needed for mild pain (pain score 1-3) or fever.     chlorproMAZINE  (THORAZINE ) 10 MG tablet Take 1 tablet (10 mg total) by mouth 3 (three) times daily as needed for hiccoughs. 90 tablet 0   cholecalciferol  (VITAMIN D ) 1000 UNITS tablet Take 1,000 Units by mouth every evening.     colchicine  0.6 MG tablet Take 1 tablet (0.6 mg total) by mouth 2 (two) times daily. 180 tablet 0   Continuous Glucose Sensor (FREESTYLE LIBRE 3 PLUS SENSOR) MISC Change sensor every 15 days. 6 each 3   dexamethasone  (DECADRON ) 1 MG tablet Take 1 tablet (1 mg total) by mouth daily with breakfast. 90 tablet 1   famotidine  (PEPCID ) 10 MG tablet Take 10 mg by mouth at bedtime. Chewable     folic acid  (FOLVITE ) 1 MG tablet Take 1 tablet (1 mg total) by mouth daily. 90 tablet 0   ibuprofen  (ADVIL ) 200 MG tablet Take 4 tablets (800 mg total) by mouth 3 (three) times daily for 14 days, THEN 3 tablets (600 mg total) 3 (three) times daily for 7 days, THEN 2 tablets (400 mg total) 3 (three) times daily for 7 days, THEN 1 tablet (200 mg total) 3 (three) times daily for 7 days. 294  tablet 0   Krill Oil 300 MG CAPS Take 300 mg by mouth every evening.     levothyroxine  (SYNTHROID ) 112 MCG tablet Take 1 tablet (112 mcg total) by mouth daily before breakfast. 90 tablet 1   loratadine  (CLARITIN ) 10 MG tablet Take 10 mg by mouth every evening.      mirtazapine  (REMERON ) 15 MG tablet Take 1 tablet (15 mg total) by mouth at bedtime. 90 tablet 1   Multiple Vitamins-Minerals (ONE A DAY MEN 50 PLUS) TABS Take 1 tablet  by mouth at bedtime.     ondansetron  (ZOFRAN ) 4 MG tablet Take 2 tablets (8 mg total) by mouth every 8 (eight) hours as needed for nausea. 60 tablet 0   pantoprazole  (PROTONIX ) 40 MG tablet Take 1 tablet (40 mg total) by mouth every evening. 90 tablet 0   polyethylene glycol powder (GLYCOLAX /MIRALAX ) 17 GM/SCOOP powder Take 119 g by mouth daily as needed for mild constipation or moderate constipation.     rosuvastatin  (CRESTOR ) 20 MG tablet Take 1 tablet (20 mg total) by mouth daily. 90 tablet 0   SYRINGE-NEEDLE, DISP, 3 ML (B-D 3CC LUER-LOK SYR 21GX1-1/2) 21G X 1-1/2 3 ML MISC Use to inject testosterone  every week 100 each 2   testosterone  cypionate (DEPOTESTOTERONE CYPIONATE) 100 MG/ML injection Take 50mg  ( 0.61ml) and 100mg  ( 1ml) into muscle alternatively weekly. 10 mL 1   venlafaxine  XR (EFFEXOR -XR) 75 MG 24 hr capsule Take 2 capsules (150 mg total) by mouth daily with breakfast. (Patient taking differently: Take 150 mg by mouth at bedtime.) 180 capsule 2   No current facility-administered medications on file prior to visit.    Allergies: No Known Allergies Past Medical History:  Past Medical History:  Diagnosis Date   Brain cancer (HCC)    Cervical spondylolysis    Depression    Essential hypertension    Family history of breast cancer    GERD (gastroesophageal reflux disease)    History of kidney stones    History of migraine    Hyperlipidemia    Hypertension    lung ca with mets to brain    Lung cancer (HCC)    PONV (postoperative nausea and vomiting)    Type 2 diabetes mellitus (HCC)    Past Surgical History:  Past Surgical History:  Procedure Laterality Date   Bilateral inguinal hernia repair     BRONCHIAL NEEDLE ASPIRATION BIOPSY  04/05/2021   Procedure: BRONCHIAL NEEDLE ASPIRATION BIOPSIES;  Surgeon: Brenna Adine CROME, DO;  Location: [facility];  Service: Pulmonary;;   COLONOSCOPY  01/23/2012   Procedure: COLONOSCOPY;  Surgeon: Lamar CHRISTELLA Hollingshead, MD;  Location: [facility];  Service: Endoscopy;  Laterality: N/A;  9:30 AM   COLONOSCOPY N/A 10/11/2015   Procedure: COLONOSCOPY;  Surgeon: Lamar CHRISTELLA Hollingshead, MD;  Location: [facility];  Service: Endoscopy;  Laterality: N/A;  830   COLONOSCOPY N/A 10/14/2019   Procedure: COLONOSCOPY;  Surgeon: Hollingshead Lamar CHRISTELLA, MD;  Location: [facility];  Service: Endoscopy;  Laterality: N/A;  1:45   HERNIA REPAIR     POLYPECTOMY  10/14/2019   Procedure: POLYPECTOMY;  Surgeon: Hollingshead Lamar CHRISTELLA, MD;  Location: [facility];  Service: Endoscopy;;  ascending colon, descending colon   VIDEO BRONCHOSCOPY WITH ENDOBRONCHIAL ULTRASOUND N/A 04/05/2021   Procedure: VIDEO BRONCHOSCOPY WITH ENDOBRONCHIAL ULTRASOUND;  Surgeon: Brenna Adine CROME, DO;  Location: [facility];  Service: Pulmonary;  Laterality: N/A;   Social History:  Social History   Socioeconomic History  Marital status: Married    Spouse name: Not on file   Number of children: Not on file   Years of education: Not on file   Highest education level: Bachelor's degree (e.g., BA, AB, BS)  Occupational History   Not on file  Tobacco Use   Smoking status: Every Day    Current packs/day: 0.50    Average packs/day: 0.5 packs/day for 50.9 years (25.4 ttl pk-yrs)    Types: Cigarettes    Start date: 10/30/1973   Smokeless tobacco: Never  Vaping Use   Vaping status: Never Used  Substance and Sexual Activity   Alcohol use: Not Currently    Comment: One drink every 6 months.   Drug use: No   Sexual activity: Yes  Other Topics Concern   Not on file  Social History Narrative   Not on file   Social Drivers of Health   Tobacco Use: High Risk (07/25/2024)   Patient History    Smoking Tobacco Use: Every Day    Smokeless Tobacco Use: Never    Passive Exposure: Not on file  Financial Resource Strain: Low Risk (07/22/2024)   Overall Financial Resource Strain (CARDIA)    Difficulty of Paying Living Expenses: Not very hard  Food Insecurity: No Food Insecurity  (07/22/2024)   Epic    Worried About Programme Researcher, Broadcasting/film/video in the Last Year: Never true    Ran Out of Food in the Last Year: Never true  Transportation Needs: No Transportation Needs (07/22/2024)   Epic    Lack of Transportation (Medical): No    Lack of Transportation (Non-Medical): No  Physical Activity: Unknown (04/19/2024)   Exercise Vital Sign    Days of Exercise per Week: 1 day    Minutes of Exercise per Session: Patient declined  Stress: No Stress Concern Present (04/19/2024)   [employer] of Occupational Health - Occupational Stress Questionnaire    Feeling of Stress: Not at all  Social Connections: Unknown (07/22/2024)   Social Connection and Isolation Panel    Frequency of Communication with Friends and Family: Patient declined    Frequency of Social Gatherings with Friends and Family: Patient declined    Attends Religious Services: Patient declined    Active Member of Clubs or Organizations: Patient declined    Attends Banker Meetings: Not on file    Marital Status: Married  Intimate Partner Violence: Not At Risk (06/13/2024)   Epic    Fear of Current or Ex-Partner: No    Emotionally Abused: No    Physically Abused: No    Sexually Abused: No  Depression (PHQ2-9): Low Risk (07/23/2024)   Depression (PHQ2-9)    PHQ-2 Score: 1  Alcohol Screen: Low Risk (07/22/2024)   Alcohol Screen    Last Alcohol Screening Score (AUDIT): 1  Housing: Unknown (07/22/2024)   Epic    Unable to Pay for Housing in the Last Year: No    Number of Times Moved in the Last Year: Not on file    Homeless in the Last Year: No  Utilities: Not At Risk (06/13/2024)   Epic    Threatened with loss of utilities: No  Health Literacy: Not on file   Family History:  Family History  Problem Relation Age of Onset   Hypertension Mother    Diabetes Mother    Heart attack Mother    Heart disease Mother    Hypertension Father    Heart attack Father    COPD Father  Heart disease Father     Heart attack Brother    Heart disease Brother    Breast cancer Maternal Aunt    Cancer Maternal Aunt        uterine vs ovarain   Stroke Maternal Grandmother    Stroke Maternal Grandfather    Colon cancer Neg Hx     Review of Systems: Constitutional: Doesn't report fevers, chills or abnormal weight loss Eyes: Doesn't report blurriness of vision Ears, nose, mouth, throat, and face: Doesn't report sore throat Respiratory: Doesn't report cough, dyspnea or wheezes Cardiovascular: Doesn't report palpitation, chest discomfort  Gastrointestinal:  Doesn't report nausea, constipation, diarrhea GU: Doesn't report incontinence Skin: Doesn't report skin rashes Neurological: Per HPI Musculoskeletal: Doesn't report joint pain Behavioral/Psych: Doesn't report anxiety  Physical Exam: Vitals:   09/17/24 0901 09/17/24 0905  BP: (!) 161/93 (!) 151/93  Pulse: (!) 111   Resp: 16   Temp: 98.1 F (36.7 C)   SpO2: 100%      KPS: 90. General: Alert, cooperative, pleasant, in no acute distress Head: Normal EENT: No conjunctival injection or scleral icterus.  Lungs: Resp effort normal Cardiac: Regular rate Abdomen: Non-distended abdomen Skin: No rashes cyanosis or petechiae. Extremities: No clubbing or edema  Neurologic Exam: Mental Status: Awake, alert, attentive to examiner. Oriented to self and environment. Language is fluent with intact comprehension.  Cranial Nerves: Visual acuity is grossly normal. Visual fields are full. Extra-ocular movements intact. No ptosis. Face is symmetric Motor: Tone and bulk are normal. Power is full in both arms and legs. Reflexes are symmetric, no pathologic reflexes present.  Sensory: Intact to light touch Gait: Normal.   Labs: I have reviewed the data as listed    Component Value Date/Time   NA 143 09/08/2024 1255   NA 145 (H) 04/22/2024 0917   K 3.6 09/08/2024 1255   CL 105 09/08/2024 1255   CO2 27 09/08/2024 1255   GLUCOSE 95 09/08/2024 1255    BUN 12 09/08/2024 1255   BUN 10 04/22/2024 0917   CREATININE 1.02 09/08/2024 1255   CALCIUM  9.2 09/08/2024 1255   PROT 7.0 09/08/2024 1255   PROT 6.8 04/22/2024 0917   ALBUMIN 4.0 09/08/2024 1255   ALBUMIN 3.9 04/22/2024 0917   AST 24 09/08/2024 1255   ALT 21 09/08/2024 1255   ALKPHOS 71 09/08/2024 1255   BILITOT 0.2 09/08/2024 1255   GFRNONAA >60 09/08/2024 1255   GFRAA 82 07/14/2020 0945   Lab Results  Component Value Date   WBC 12.8 (H) 09/08/2024   NEUTROABS 10.8 (H) 09/08/2024   HGB 10.1 (L) 09/08/2024   HCT 35.1 (L) 09/08/2024   MCV 81.6 09/08/2024   PLT 334 09/08/2024   Imaging:  [facility] Clinician Interpretation: I have personally reviewed the CNS images as listed.  My interpretation, in the context of the patient's clinical presentation, is treatment effect vs true progression  MR BRAIN W WO CONTRAST Result Date: 09/11/2024 EXAM: MRI BRAIN WITH AND WITHOUT CONTRAST 09/11/2024 01:42:05 PM TECHNIQUE: Multiplanar multisequence MRI of the head/brain was performed with and without the administration of intravenous contrast. COMPARISON: None available. CLINICAL HISTORY: Brain/CNS neoplasm, assess treatment response. FINDINGS: BRAIN AND VENTRICLES: No acute infarct. No acute intracranial hemorrhage. No mass effect or midline shift. No hydrocephalus. The sella is unremarkable. Normal flow voids. There is a new small nodular focus of enhancement within the right parietal lobe superficial to the previously noted metastatic lesion. The original lesion also appears to have marginally increased in size in  the interim from approximately 3.4 mm to 4.2 mm. Both lesions are demonstrated on image 95 of series 15. An enhancing nodular lesion is also again demonstrated within the left cerebellar hemisphere and appears unchanged. There is extensive cerebral white matter disease again demonstrated. ORBITS: No acute abnormality. SINUSES: No acute abnormality. BONES AND SOFT TISSUES: Normal bone marrow  signal and enhancement. There is a moderate right mastoid air cell effusion. IMPRESSION: 1. New small nodular focus of enhancement within the right parietal lobe superficial to the previously noted metastatic lesion, which has marginally increased in size from approximately 3.4 mm to 4.2 mm. 2. Stable enhancing nodular lesion within the left cerebellar hemisphere. 3. Extensive cerebral white matter disease. 4. Moderate right mastoid air cell effusion. Electronically signed by: Evalene Coho MD 09/11/2024 02:08 PM EST RP Workstation: [id number]     Assessment/Plan No diagnosis found.  Bradley Goodman is clinically and radiographically stable today.  MRI brain demonstrates modest increase of enhancement within treated right parietal lesion.  There is no mass effect or T2/FLAIR signal change.  Previously seen tiny focus of enhancement within left frontal lobe is not visible on present study.  Etiology is favored treatment effect over tumor recurrence, though both are on differential.  Recommended continued close imaging surveillance, with repeat MRI in 2 months.  For pan-hypopit, will continue to follow with Dr. Lenis.    He will con't systemic therapy with Dr. Sherrod.  We appreciate the opportunity to participate in the care of Bradley Goodman.   We ask that Bradley Goodman return to clinic in 2 months following next brain MRI, or sooner as needed.  All questions were answered. The patient knows to call the clinic with any problems, questions or concerns. No barriers to learning were detected.  The total time spent in the encounter was 40 minutes and more than 50% was on counseling and review of test results   Arthea MARLA Manns, MD Medical Director of Neuro-Oncology [facility] 09/17/2024 9:07 AM

## 2024-09-18 ENCOUNTER — Telehealth: Payer: Self-pay | Admitting: Internal Medicine

## 2024-09-18 NOTE — Telephone Encounter (Signed)
 Scheduled patient for next appointment. Called and spoke with the patient, he is aware.

## 2024-09-21 ENCOUNTER — Inpatient Hospital Stay

## 2024-09-21 ENCOUNTER — Inpatient Hospital Stay: Admitting: Physician Assistant

## 2024-09-21 ENCOUNTER — Encounter: Payer: Self-pay | Admitting: Internal Medicine

## 2024-09-22 ENCOUNTER — Ambulatory Visit (HOSPITAL_COMMUNITY)
Admission: RE | Admit: 2024-09-22 | Discharge: 2024-09-22 | Disposition: A | Source: Ambulatory Visit | Attending: Physician Assistant

## 2024-09-22 DIAGNOSIS — I3139 Other pericardial effusion (noninflammatory): Secondary | ICD-10-CM | POA: Diagnosis not present

## 2024-09-22 DIAGNOSIS — C3491 Malignant neoplasm of unspecified part of right bronchus or lung: Secondary | ICD-10-CM | POA: Diagnosis not present

## 2024-09-22 DIAGNOSIS — C3490 Malignant neoplasm of unspecified part of unspecified bronchus or lung: Secondary | ICD-10-CM | POA: Diagnosis not present

## 2024-09-22 DIAGNOSIS — J90 Pleural effusion, not elsewhere classified: Secondary | ICD-10-CM | POA: Diagnosis not present

## 2024-09-22 MED ORDER — IOHEXOL 300 MG/ML  SOLN
100.0000 mL | Freq: Once | INTRAMUSCULAR | Status: AC | PRN
  Administered 2024-09-22: 13:00:00 100 mL via INTRAVENOUS

## 2024-09-22 MED ORDER — IOHEXOL 300 MG/ML  SOLN: 75.0000 mL | Freq: Once | INTRAMUSCULAR | Status: DC | PRN

## 2024-09-29 ENCOUNTER — Inpatient Hospital Stay

## 2024-09-29 ENCOUNTER — Inpatient Hospital Stay (HOSPITAL_BASED_OUTPATIENT_CLINIC_OR_DEPARTMENT_OTHER): Admitting: Internal Medicine

## 2024-09-29 VITALS — BP 168/94 | HR 95 | Resp 17 | Ht 71.5 in | Wt 194.0 lb

## 2024-09-29 DIAGNOSIS — C3491 Malignant neoplasm of unspecified part of right bronchus or lung: Secondary | ICD-10-CM | POA: Diagnosis not present

## 2024-09-29 LAB — CBC WITH DIFFERENTIAL (CANCER CENTER ONLY)
Abs Immature Granulocytes: 0.03 K/uL (ref 0.00–0.07)
Basophils Absolute: 0.1 K/uL (ref 0.0–0.1)
Basophils Relative: 1 %
Eosinophils Absolute: 0 K/uL (ref 0.0–0.5)
Eosinophils Relative: 0 %
HCT: 38.3 % — ABNORMAL LOW (ref 39.0–52.0)
Hemoglobin: 10.9 g/dL — ABNORMAL LOW (ref 13.0–17.0)
Immature Granulocytes: 0 %
Lymphocytes Relative: 11 %
Lymphs Abs: 1.2 K/uL (ref 0.7–4.0)
MCH: 23.1 pg — ABNORMAL LOW (ref 26.0–34.0)
MCHC: 28.5 g/dL — ABNORMAL LOW (ref 30.0–36.0)
MCV: 81.3 fL (ref 80.0–100.0)
Monocytes Absolute: 0.4 K/uL (ref 0.1–1.0)
Monocytes Relative: 4 %
Neutro Abs: 9.1 K/uL — ABNORMAL HIGH (ref 1.7–7.7)
Neutrophils Relative %: 84 %
Platelet Count: 488 K/uL — ABNORMAL HIGH (ref 150–400)
RBC: 4.71 MIL/uL (ref 4.22–5.81)
RDW: 21.4 % — ABNORMAL HIGH (ref 11.5–15.5)
WBC Count: 10.9 K/uL — ABNORMAL HIGH (ref 4.0–10.5)
nRBC: 0 % (ref 0.0–0.2)

## 2024-09-29 LAB — CMP (CANCER CENTER ONLY)
ALT: 22 U/L (ref 0–44)
AST: 27 U/L (ref 15–41)
Albumin: 4.4 g/dL (ref 3.5–5.0)
Alkaline Phosphatase: 79 U/L (ref 38–126)
Anion gap: 12 (ref 5–15)
BUN: 13 mg/dL (ref 8–23)
CO2: 27 mmol/L (ref 22–32)
Calcium: 9.7 mg/dL (ref 8.9–10.3)
Chloride: 105 mmol/L (ref 98–111)
Creatinine: 1.14 mg/dL (ref 0.61–1.24)
GFR, Estimated: >60 mL/min
Glucose, Bld: 112 mg/dL — ABNORMAL HIGH (ref 70–99)
Potassium: 4.1 mmol/L (ref 3.5–5.1)
Sodium: 144 mmol/L (ref 135–145)
Total Bilirubin: 0.3 mg/dL (ref 0.0–1.2)
Total Protein: 7.6 g/dL (ref 6.5–8.1)

## 2024-09-29 MED ORDER — SODIUM CHLORIDE 0.9 % IV SOLN
200.0000 mg | Freq: Once | INTRAVENOUS | Status: AC
  Administered 2024-09-29: 200 mg via INTRAVENOUS
  Filled 2024-09-29: qty 8

## 2024-09-29 MED ORDER — SODIUM CHLORIDE 0.9 % IV SOLN
500.0000 mg/m2 | Freq: Once | INTRAVENOUS | Status: AC
  Administered 2024-09-29: 1000 mg via INTRAVENOUS
  Filled 2024-09-29: qty 40

## 2024-09-29 MED ORDER — SODIUM CHLORIDE 0.9 % IV SOLN: Freq: Once | INTRAVENOUS | Status: AC

## 2024-09-29 MED ORDER — PROCHLORPERAZINE MALEATE 10 MG PO TABS
10.0000 mg | ORAL_TABLET | Freq: Once | ORAL | Status: AC
  Administered 2024-09-29: 10 mg via ORAL
  Filled 2024-09-29: qty 1

## 2024-09-29 NOTE — Progress Notes (Signed)
 "     [facility] [phone]   [phone]  OFFICE PROGRESS NOTE  Bradley [name], FNP [address] [place] [address]  DIAGNOSIS: Stage IV (T1b, N3, M1C) non-small cell lung cancer, favoring adenocarcinoma presented with right upper lobe lung nodule in addition to right hilar, subcarinal and bilateral mediastinal as well as supraclavicular lymphadenopathy in addition to bone and brain metastasis diagnosed in June 2022.     PD-L1 expression 80%.     Molecular Studies:  Biomarker Findings Microsatellite status - MS-Stable Tumor Mutational Burden - 6 Muts/Mb Genomic Findings For a complete list of the genes assayed, please refer to the Appendix. KRAS G12C, amplification ATM S470* CCND1 amplification - equivocal HGF amplification - equivocal MYC amplification - equivocal FGF19 amplification - equivocal FGF3 amplification - equivocal FGF4 amplification - equivocal NFKBIA amplification NKX2-1 amplification RAD21 amplification - equivocal [id number]*26 TERT promoter -124C>T TP53 rearrangement exon 9 7 Disease relevant genes with no reportable alterations: ALK, BRAF, EGFR, ERBB2, MET, RET, ROS1   PRIOR THERAPY: SRS to the metastatic brain lesions under the care of Dr. Patrcia.  Last treatment on 05/04/2021.   CURRENT THERAPY: Palliative systemic chemotherapy with carboplatin  for an AUC 5, Alimta  500 mg/m2 and, Keytruda  200 mg IV every 3 weeks.  First dose on 05/08/2021.  Status post 57 cycles.  Starting from cycle #5 he is on maintenance treatment with Alimta  and Keytruda  every 3 weeks.  INTERVAL HISTORY: Bradley Goodman 68 y.o. male returns to the clinic today for follow-up visit accompanied by his wife.Discussed the use of AI scribe software for clinical note transcription with the patient, who gave verbal consent to proceed.  History of Present Illness Bradley Goodman is a 68 year old male with stage IV KRAS G12C-mutated,  PD-L1 high lung adenocarcinoma with brain metastases and pericardial effusion who presents for routine follow-up after recent imaging.  He has completed 57 cycles of systemic therapy, initially with carboplatin , pemetrexed , and pembrolizumab  for four cycles, followed by ongoing maintenance with pemetrexed  and pembrolizumab . Recent CT of the chest, abdomen, and pelvis, and MRI of the brain were performed. The patient recalls being told that one of the brain spots was slightly larger by about a millimeter, but otherwise there were no significant changes. He remains asymptomatic with no new neurological symptoms.  He has a persistent pericardial effusion, first identified prior to the last visit. A 2D echocardiogram at the end of October showed stable findings. He denies chest pain, dyspnea, or other cardiac symptoms.  He denies new complaints since the last visit. No chest pain, dyspnea, or hiccups at this time. He has previously used Psoriasin for hiccups and reports that it is effective, but did not specify current use.    MEDICAL HISTORY: Past Medical History:  Diagnosis Date   Brain cancer [facility])    Cervical spondylolysis    Depression    Essential hypertension    Family history of breast cancer    GERD (gastroesophageal reflux disease)    History of kidney stones    History of migraine    Hyperlipidemia    Hypertension    lung ca with mets to brain    Lung cancer (HCC)    PONV (postoperative nausea and vomiting)    Type 2 diabetes mellitus (HCC)     ALLERGIES:  has no known allergies.  MEDICATIONS:  Current Outpatient Medications  Medication Sig Dispense Refill   acetaminophen  (TYLENOL ) 500 MG tablet Take 500 mg by mouth  every 6 (six) hours as needed for mild pain (pain score 1-3) or fever.     chlorproMAZINE  (THORAZINE ) 10 MG tablet Take 1 tablet (10 mg total) by mouth 3 (three) times daily as needed for hiccoughs. 90 tablet 0   cholecalciferol  (VITAMIN D ) 1000 UNITS tablet  Take 1,000 Units by mouth every evening.     colchicine  0.6 MG tablet Take 1 tablet (0.6 mg total) by mouth 2 (two) times daily. 180 tablet 0   Continuous Glucose Sensor (FREESTYLE LIBRE 3 PLUS SENSOR) MISC Change sensor every 15 days. 6 each 3   dexamethasone  (DECADRON ) 1 MG tablet Take 1 tablet (1 mg total) by mouth daily with breakfast. 90 tablet 1   famotidine  (PEPCID ) 10 MG tablet Take 10 mg by mouth at bedtime. Chewable     folic acid  (FOLVITE ) 1 MG tablet Take 1 tablet (1 mg total) by mouth daily. 90 tablet 0   Krill Oil 300 MG CAPS Take 300 mg by mouth every evening.     levothyroxine  (SYNTHROID ) 112 MCG tablet Take 1 tablet (112 mcg total) by mouth daily before breakfast. 90 tablet 1   loratadine  (CLARITIN ) 10 MG tablet Take 10 mg by mouth every evening.      mirtazapine  (REMERON ) 15 MG tablet Take 1 tablet (15 mg total) by mouth at bedtime. 90 tablet 1   Multiple Vitamins-Minerals (ONE A DAY MEN 50 PLUS) TABS Take 1 tablet by mouth at bedtime.     ondansetron  (ZOFRAN ) 4 MG tablet Take 2 tablets (8 mg total) by mouth every 8 (eight) hours as needed for nausea. 60 tablet 0   pantoprazole  (PROTONIX ) 40 MG tablet Take 1 tablet (40 mg total) by mouth every evening. 90 tablet 0   polyethylene glycol powder (GLYCOLAX /MIRALAX ) 17 GM/SCOOP powder Take 119 g by mouth daily as needed for mild constipation or moderate constipation.     rosuvastatin  (CRESTOR ) 20 MG tablet Take 1 tablet (20 mg total) by mouth daily. 90 tablet 0   SYRINGE-NEEDLE, DISP, 3 ML (B-D 3CC LUER-LOK SYR 21GX1-1/2) 21G X 1-1/2 3 ML MISC Use to inject testosterone  every week 100 each 2   testosterone  cypionate (DEPOTESTOTERONE CYPIONATE) 100 MG/ML injection Take 50mg  ( 0.96ml) and 100mg  ( 1ml) into muscle alternatively weekly. 10 mL 1   venlafaxine  XR (EFFEXOR -XR) 75 MG 24 hr capsule Take 2 capsules (150 mg total) by mouth daily with breakfast. (Patient taking differently: Take 150 mg by mouth at bedtime.) 180 capsule 2   No  current facility-administered medications for this visit.    SURGICAL HISTORY:  Past Surgical History:  Procedure Laterality Date   Bilateral inguinal hernia repair     BRONCHIAL NEEDLE ASPIRATION BIOPSY  04/05/2021   Procedure: BRONCHIAL NEEDLE ASPIRATION BIOPSIES;  Surgeon: Brenna Adine CROME, DO;  Location: [facility];  Service: Pulmonary;;   COLONOSCOPY  01/23/2012   Procedure: COLONOSCOPY;  Surgeon: Lamar CHRISTELLA Hollingshead, MD;  Location: [facility];  Service: Endoscopy;  Laterality: N/A;  9:30 AM   COLONOSCOPY N/A 10/11/2015   Procedure: COLONOSCOPY;  Surgeon: Lamar CHRISTELLA Hollingshead, MD;  Location: [facility];  Service: Endoscopy;  Laterality: N/A;  830   COLONOSCOPY N/A 10/14/2019   Procedure: COLONOSCOPY;  Surgeon: Hollingshead Lamar CHRISTELLA, MD;  Location: [facility];  Service: Endoscopy;  Laterality: N/A;  1:45   HERNIA REPAIR     POLYPECTOMY  10/14/2019   Procedure: POLYPECTOMY;  Surgeon: Hollingshead Lamar CHRISTELLA, MD;  Location: [facility];  Service: Endoscopy;;  ascending colon, descending colon   VIDEO BRONCHOSCOPY WITH ENDOBRONCHIAL ULTRASOUND N/A 04/05/2021   Procedure: VIDEO BRONCHOSCOPY WITH ENDOBRONCHIAL ULTRASOUND;  Surgeon: Brenna Adine CROME, DO;  Location: [facility];  Service: Pulmonary;  Laterality: N/A;    REVIEW OF SYSTEMS:  Constitutional: positive for fatigue Eyes: negative Ears, nose, mouth, throat, and face: negative Respiratory: negative Cardiovascular: negative Gastrointestinal: positive for hiccups Genitourinary:negative Integument/breast: negative Hematologic/lymphatic: negative Musculoskeletal:negative Neurological: negative Behavioral/Psych: negative Endocrine: negative Allergic/Immunologic: negative   PHYSICAL EXAMINATION: General appearance: alert, cooperative, appears stated age, fatigued, and no distress Head: Normocephalic, without obvious abnormality, atraumatic Neck: no adenopathy, no JVD, supple, symmetrical, trachea midline, and thyroid  not enlarged,  symmetric, no tenderness/mass/nodules Lymph nodes: Cervical, supraclavicular, and axillary nodes normal. Resp: clear to auscultation bilaterally Back: symmetric, no curvature. ROM normal. No CVA tenderness. Cardio: regular rate and rhythm, S1, S2 normal, no murmur, click, rub or gallop GI: soft, non-tender; bowel sounds normal; no masses,  no organomegaly Extremities: extremities normal, atraumatic, no cyanosis or edema Neurologic: Alert and oriented X 3, normal strength and tone. Normal symmetric reflexes. Normal coordination and gait  ECOG PERFORMANCE STATUS: 1 - Symptomatic but completely ambulatory  Blood pressure (!) 168/94, pulse 95, resp. rate 17, height 5' 11.5 (1.816 m), weight 194 lb (88 kg), SpO2 99%.  LABORATORY DATA: Lab Results  Component Value Date   WBC 10.9 (H) 09/29/2024   HGB 10.9 (L) 09/29/2024   HCT 38.3 (L) 09/29/2024   MCV 81.3 09/29/2024   PLT 488 (H) 09/29/2024      Chemistry      Component Value Date/Time   NA 144 09/29/2024 1051   NA 145 (H) 04/22/2024 0917   K 4.1 09/29/2024 1051   CL 105 09/29/2024 1051   CO2 27 09/29/2024 1051   BUN 13 09/29/2024 1051   BUN 10 04/22/2024 0917   CREATININE 1.14 09/29/2024 1051      Component Value Date/Time   CALCIUM  9.7 09/29/2024 1051   ALKPHOS 79 09/29/2024 1051   AST 27 09/29/2024 1051   ALT 22 09/29/2024 1051   BILITOT 0.3 09/29/2024 1051       RADIOGRAPHIC STUDIES: CT CHEST ABDOMEN PELVIS W CONTRAST Result Date: 09/29/2024 CLINICAL DATA:  Follow-up metastatic lung carcinoma. Currently undergoing immunotherapy. Previous chemotherapy and radiation therapy. * Tracking Code: BO * EXAM: CT CHEST, ABDOMEN, AND PELVIS WITH CONTRAST TECHNIQUE: Multidetector CT imaging of the chest, abdomen and pelvis was performed following the standard protocol during bolus administration of intravenous contrast. RADIATION DOSE REDUCTION: This exam was performed according to the departmental dose-optimization program which  includes automated exposure control, adjustment of the mA and/or kV according to patient size and/or use of iterative reconstruction technique. CONTRAST:  OMNIPAQUE  IOHEXOL  300 MG/ML  SOLN COMPARISON:  06/16/2024 and 06/13/2024 FINDINGS: CT CHEST FINDINGS Cardiovascular: Increased size of moderate pericardial effusion. Aortic and coronary atherosclerotic calcification noted. Mediastinum/Lymph Nodes: No masses or pathologically enlarged lymph nodes identified. Lungs/Pleura: Increased size of small bilateral pleural effusions. 17 mm spiculated nodule irregular nodule in the right upper lobe shows no significant change. Two sub-cm nodules in the peripheral right upper lobe are also stable. Sub-cm ground-glass nodule in the posterior left upper lobe on image 72/4 is also unchanged. No new or enlarging pulmonary nodules or masses identified. Musculoskeletal:  No suspicious bone lesions identified. CT ABDOMEN AND PELVIS FINDINGS Hepatobiliary: 7 mm nonspecific low-attenuation lesion in the inferior right hepatic lobe remains stable. No other liver lesions identified. No evidence of cholecystitis or biliary ductal  dilatation. Pancreas:  No mass or inflammatory changes. Spleen:  Within normal limits in size and appearance. Adrenals/Urinary tract: Normal adrenal glands. No renal masses. 7 mm nonobstructing calculus again seen in upper pole of left kidney. No evidence of ureteral calculi or hydronephrosis. Stomach/Bowel: No evidence of obstruction, inflammatory process, or abnormal fluid collections. Vascular/Lymphatic: No pathologically enlarged lymph nodes identified. No acute vascular findings. Reproductive:  Stable mildly enlarged prostate. Other:  None. Musculoskeletal:  No suspicious bone lesions identified. IMPRESSION: Stable dominant right upper lobe nodule and other sub-cm bilateral pulmonary nodules. No new or progressive metastatic disease identified. Increased size of moderate pericardial effusion and small  bilateral pleural effusions. Electronically Signed   By: Norleen DELENA Kil M.D.   On: 09/29/2024 09:55   MR BRAIN W WO CONTRAST Result Date: 09/11/2024 EXAM: MRI BRAIN WITH AND WITHOUT CONTRAST 09/11/2024 01:42:05 PM TECHNIQUE: Multiplanar multisequence MRI of the head/brain was performed with and without the administration of intravenous contrast. COMPARISON: None available. CLINICAL HISTORY: Brain/CNS neoplasm, assess treatment response. FINDINGS: BRAIN AND VENTRICLES: No acute infarct. No acute intracranial hemorrhage. No mass effect or midline shift. No hydrocephalus. The sella is unremarkable. Normal flow voids. There is a new small nodular focus of enhancement within the right parietal lobe superficial to the previously noted metastatic lesion. The original lesion also appears to have marginally increased in size in the interim from approximately 3.4 mm to 4.2 mm. Both lesions are demonstrated on image 95 of series 15. An enhancing nodular lesion is also again demonstrated within the left cerebellar hemisphere and appears unchanged. There is extensive cerebral white matter disease again demonstrated. ORBITS: No acute abnormality. SINUSES: No acute abnormality. BONES AND SOFT TISSUES: Normal bone marrow signal and enhancement. There is a moderate right mastoid air cell effusion. IMPRESSION: 1. New small nodular focus of enhancement within the right parietal lobe superficial to the previously noted metastatic lesion, which has marginally increased in size from approximately 3.4 mm to 4.2 mm. 2. Stable enhancing nodular lesion within the left cerebellar hemisphere. 3. Extensive cerebral white matter disease. 4. Moderate right mastoid air cell effusion. Electronically signed by: Evalene Coho MD 09/11/2024 02:08 PM EST RP Workstation: [id number]       ASSESSMENT AND PLAN: This is a very pleasant 68 years old white male recently diagnosed with stage IV (T1b, N3, M1 C) non-small cell lung cancer favoring  adenocarcinoma presented with right upper lobe lung nodule in addition to right hilar, subcarinal and bilateral mediastinal as well as supraclavicular lymphadenopathy.  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  His PD-L1 expression is 80% and his molecular studies showed KRAS G12C mutation. The patient underwent SRS to metastatic brain lesion under the care of Dr. Patrcia and he is currently undergoing systemic chemotherapy with carboplatin  for AUC of 5, Alimta  500 Mg/M2 and Keytruda  200 Mg IV every 3 weeks status post 57 cycles.  Starting from cycle #5 the patient will be treated with maintenance treatment with Alimta  and Keytruda  every 3 weeks.  The patient continues to tolerate this treatment fairly well. He had repeat CT scan of the chest, abdomen and pelvis performed recently.  I personally independently reviewed the scan and discussed the result with the patient and his wife today.  His scan showed no concerning finding for disease progression except for the increased of the pericardial effusion. Assessment and Plan Assessment & Plan Stage IV lung adenocarcinoma with brain metastasis He has advanced KRAS G12C-mutated lung adenocarcinoma with brain  metastasis, currently receiving therapy following initial chemoimmunotherapy. Imaging demonstrates largely stable disease, with a 1 mm increase in one brain lesion. He remains asymptomatic without new complaints. - Planned repeat brain MRI in two months.  Pericardial effusion He has a persistent pericardial effusion identified on imaging, with stable findings on echocardiogram and no cardiac symptoms. Cardiology evaluation is ongoing to determine need for intervention. - Advised follow-up with cardiology next month to assess need for pericardial drainage versus continued observation. The patient was advised to call immediately if he has any other concerning symptoms in the interval. The patient voices understanding of  current disease status and treatment options and is in agreement with the current care plan.  All questions were answered. The patient knows to call the clinic with any problems, questions or concerns. We can certainly see the patient much sooner if necessary. The total time spent in the appointment was 30 minutes including review of chart and various tests results, discussions about plan of care and coordination of care plan .   Disclaimer: This note was dictated with voice recognition software. Similar sounding words can inadvertently be transcribed and may not be corrected upon review.        "

## 2024-09-29 NOTE — Patient Instructions (Signed)
 [facility] - A DEPT OF [facility]  Discharge Instructions: Thank you for choosing [facility] to provide your oncology and hematology care.   If you have a lab appointment with the Cancer Center, please go directly to the Cancer Center and check in at the registration area.   Wear comfortable clothing and clothing appropriate for easy access to any Portacath or PICC line.   We strive to give you quality time with your provider. You may need to reschedule your appointment if you arrive late (15 or more minutes).  Arriving late affects you and other patients whose appointments are after yours.  Also, if you miss three or more appointments without notifying the office, you may be dismissed from the clinic at the provider's discretion.      For prescription refill requests, have your pharmacy contact our office and allow 72 hours for refills to be completed.    Today you received the following chemotherapy and/or immunotherapy agents: Keytruda, Alimta.       To help prevent nausea and vomiting after your treatment, we encourage you to take your nausea medication as directed.  BELOW ARE SYMPTOMS THAT SHOULD BE REPORTED IMMEDIATELY: *FEVER GREATER THAN 100.4 F (38 C) OR HIGHER *CHILLS OR SWEATING *NAUSEA AND VOMITING THAT IS NOT CONTROLLED WITH YOUR NAUSEA MEDICATION *UNUSUAL SHORTNESS OF BREATH *UNUSUAL BRUISING OR BLEEDING *URINARY PROBLEMS (pain or burning when urinating, or frequent urination) *BOWEL PROBLEMS (unusual diarrhea, constipation, pain near the anus) TENDERNESS IN MOUTH AND THROAT WITH OR WITHOUT PRESENCE OF ULCERS (sore throat, sores in mouth, or a toothache) UNUSUAL RASH, SWELLING OR PAIN  UNUSUAL VAGINAL DISCHARGE OR ITCHING   Items with * indicate a potential emergency and should be followed up as soon as possible or go to the Emergency Department if any problems should occur.  Please show the CHEMOTHERAPY ALERT CARD or  IMMUNOTHERAPY ALERT CARD at check-in to the Emergency Department and triage nurse.  Should you have questions after your visit or need to cancel or reschedule your appointment, please contact [facility] - A DE[facility]  Dept: [phone]  and follow the prompts.  Office hours are 8:00 a.m. to 4:30 p.m. Monday - Friday. Please note that voicemails left after 4:00 p.m. may not be returned until the following business day.  We are closed weekends and major holidays. You have access to a nurse at all times for urgent questions. Please call the main number to the clinic Dept: [phone] and follow the prompts.   For any non-urgent questions, you may also contact your provider using MyChart. We now offer e-Visits for anyone 61 and older to request care online for non-urgent symptoms. For details visit [email or web address].   Also download the MyChart app! Go to the app store, search "MyChart", open the app, select , and log in with your MyChart username and password.

## 2024-10-02 ENCOUNTER — Inpatient Hospital Stay (HOSPITAL_COMMUNITY)
Admission: EM | Admit: 2024-10-02 | Discharge: 2024-10-04 | DRG: 194 | Disposition: A | Discharge status: Home / Self Care | Attending: Internal Medicine | Admitting: Internal Medicine

## 2024-10-02 ENCOUNTER — Encounter (HOSPITAL_COMMUNITY): Payer: Self-pay

## 2024-10-02 ENCOUNTER — Emergency Department (HOSPITAL_COMMUNITY)

## 2024-10-02 ENCOUNTER — Other Ambulatory Visit (HOSPITAL_COMMUNITY): Payer: Self-pay

## 2024-10-02 ENCOUNTER — Other Ambulatory Visit: Payer: Self-pay

## 2024-10-02 DIAGNOSIS — E782 Mixed hyperlipidemia: Secondary | ICD-10-CM | POA: Diagnosis not present

## 2024-10-02 DIAGNOSIS — Z79890 Hormone replacement therapy (postmenopausal): Secondary | ICD-10-CM

## 2024-10-02 DIAGNOSIS — Z85118 Personal history of other malignant neoplasm of bronchus and lung: Secondary | ICD-10-CM

## 2024-10-02 DIAGNOSIS — E099 Drug or chemical induced diabetes mellitus without complications: Secondary | ICD-10-CM | POA: Diagnosis present

## 2024-10-02 DIAGNOSIS — D649 Anemia, unspecified: Secondary | ICD-10-CM | POA: Diagnosis present

## 2024-10-02 DIAGNOSIS — Z8249 Family history of ischemic heart disease and other diseases of the circulatory system: Secondary | ICD-10-CM

## 2024-10-02 DIAGNOSIS — J101 Influenza due to other identified influenza virus with other respiratory manifestations: Principal | ICD-10-CM | POA: Diagnosis present

## 2024-10-02 DIAGNOSIS — A419 Sepsis, unspecified organism: Secondary | ICD-10-CM | POA: Diagnosis not present

## 2024-10-02 DIAGNOSIS — I10 Essential (primary) hypertension: Secondary | ICD-10-CM | POA: Diagnosis present

## 2024-10-02 DIAGNOSIS — F17210 Nicotine dependence, cigarettes, uncomplicated: Secondary | ICD-10-CM | POA: Diagnosis present

## 2024-10-02 DIAGNOSIS — Z833 Family history of diabetes mellitus: Secondary | ICD-10-CM | POA: Diagnosis not present

## 2024-10-02 DIAGNOSIS — Z9221 Personal history of antineoplastic chemotherapy: Secondary | ICD-10-CM | POA: Diagnosis not present

## 2024-10-02 DIAGNOSIS — Z79899 Other long term (current) drug therapy: Secondary | ICD-10-CM | POA: Diagnosis not present

## 2024-10-02 DIAGNOSIS — E039 Hypothyroidism, unspecified: Secondary | ICD-10-CM | POA: Diagnosis not present

## 2024-10-02 DIAGNOSIS — R0902 Hypoxemia: Secondary | ICD-10-CM | POA: Diagnosis present

## 2024-10-02 DIAGNOSIS — R531 Weakness: Secondary | ICD-10-CM | POA: Diagnosis not present

## 2024-10-02 DIAGNOSIS — E872 Acidosis, unspecified: Secondary | ICD-10-CM | POA: Diagnosis not present

## 2024-10-02 DIAGNOSIS — F3340 Major depressive disorder, recurrent, in remission, unspecified: Secondary | ICD-10-CM

## 2024-10-02 DIAGNOSIS — T380X5A Adverse effect of glucocorticoids and synthetic analogues, initial encounter: Secondary | ICD-10-CM | POA: Diagnosis present

## 2024-10-02 DIAGNOSIS — Z85841 Personal history of malignant neoplasm of brain: Secondary | ICD-10-CM | POA: Diagnosis not present

## 2024-10-02 DIAGNOSIS — K219 Gastro-esophageal reflux disease without esophagitis: Secondary | ICD-10-CM | POA: Diagnosis not present

## 2024-10-02 DIAGNOSIS — E1169 Type 2 diabetes mellitus with other specified complication: Secondary | ICD-10-CM

## 2024-10-02 DIAGNOSIS — F32A Depression, unspecified: Secondary | ICD-10-CM | POA: Diagnosis present

## 2024-10-02 DIAGNOSIS — R630 Anorexia: Secondary | ICD-10-CM

## 2024-10-02 LAB — DIFFERENTIAL
Abs Immature Granulocytes: 0.06 K/uL (ref 0.00–0.07)
Basophils Absolute: 0.1 K/uL (ref 0.0–0.1)
Basophils Relative: 1 %
Eosinophils Absolute: 0 K/uL (ref 0.0–0.5)
Eosinophils Relative: 0 %
Immature Granulocytes: 1 %
Lymphocytes Relative: 5 %
Lymphs Abs: 0.6 K/uL — ABNORMAL LOW (ref 0.7–4.0)
Monocytes Absolute: 0.1 K/uL (ref 0.1–1.0)
Monocytes Relative: 1 %
Neutro Abs: 11 K/uL — ABNORMAL HIGH (ref 1.7–7.7)
Neutrophils Relative %: 92 %

## 2024-10-02 LAB — LACTIC ACID, PLASMA
Lactic Acid, Venous: 2.1 mmol/L (ref 0.5–1.9)
Lactic Acid, Venous: 1.5 mmol/L (ref 0.5–1.9)

## 2024-10-02 LAB — URINALYSIS, ROUTINE W REFLEX MICROSCOPIC
Bacteria, UA: NONE SEEN
Bilirubin Urine: NEGATIVE
Glucose, UA: NEGATIVE mg/dL
Ketones, ur: NEGATIVE mg/dL
Leukocytes,Ua: NEGATIVE
Nitrite: NEGATIVE
Protein, ur: 30 mg/dL — AB
Specific Gravity, Urine: 1.016 (ref 1.005–1.030)
pH: 6 (ref 5.0–8.0)

## 2024-10-02 LAB — RESP PANEL BY RT-PCR (RSV, FLU A&B, COVID)  RVPGX2
Influenza A by PCR: POSITIVE — AB
Influenza B by PCR: NEGATIVE
Resp Syncytial Virus by PCR: NEGATIVE
SARS Coronavirus 2 by RT PCR: NEGATIVE

## 2024-10-02 LAB — COMPREHENSIVE METABOLIC PANEL WITH GFR
ALT: 32 U/L (ref 0–44)
AST: 51 U/L — ABNORMAL HIGH (ref 15–41)
Albumin: 3.9 g/dL (ref 3.5–5.0)
Alkaline Phosphatase: 76 U/L (ref 38–126)
Anion gap: 10 (ref 5–15)
BUN: 13 mg/dL (ref 8–23)
CO2: 29 mmol/L (ref 22–32)
Calcium: 9 mg/dL (ref 8.9–10.3)
Chloride: 100 mmol/L (ref 98–111)
Creatinine, Ser: 1.27 mg/dL — ABNORMAL HIGH (ref 0.61–1.24)
GFR, Estimated: >60 mL/min
Glucose, Bld: 115 mg/dL — ABNORMAL HIGH (ref 70–99)
Potassium: 4.5 mmol/L (ref 3.5–5.1)
Sodium: 139 mmol/L (ref 135–145)
Total Bilirubin: 0.5 mg/dL (ref 0.0–1.2)
Total Protein: 7.1 g/dL (ref 6.5–8.1)

## 2024-10-02 LAB — CBC
HCT: 38.4 % — ABNORMAL LOW (ref 39.0–52.0)
Hemoglobin: 10.6 g/dL — ABNORMAL LOW (ref 13.0–17.0)
MCH: 23.1 pg — ABNORMAL LOW (ref 26.0–34.0)
MCHC: 27.6 g/dL — ABNORMAL LOW (ref 30.0–36.0)
MCV: 83.7 fL (ref 80.0–100.0)
Platelets: 422 K/uL — ABNORMAL HIGH (ref 150–400)
RBC: 4.59 MIL/uL (ref 4.22–5.81)
RDW: 21.6 % — ABNORMAL HIGH (ref 11.5–15.5)
WBC: 11.7 K/uL — ABNORMAL HIGH (ref 4.0–10.5)
nRBC: 0 % (ref 0.0–0.2)

## 2024-10-02 MED ORDER — IPRATROPIUM BROMIDE 0.02 % IN SOLN
0.5000 mg | Freq: Four times a day (QID) | RESPIRATORY_TRACT | Status: DC
  Administered 2024-10-03 (×4): 0.5 mg via RESPIRATORY_TRACT
  Filled 2024-10-02 (×4): qty 2.5

## 2024-10-02 MED ORDER — SODIUM CHLORIDE 0.9 % IV SOLN
2.0000 g | Freq: Once | INTRAVENOUS | Status: AC
  Administered 2024-10-02: 2 g via INTRAVENOUS
  Filled 2024-10-02: qty 12.5

## 2024-10-02 MED ORDER — SODIUM CHLORIDE 0.9 % IV BOLUS
1000.0000 mL | Freq: Once | INTRAVENOUS | Status: AC
  Administered 2024-10-02: 1000 mL via INTRAVENOUS

## 2024-10-02 MED ORDER — HYDROCORTISONE SOD SUC (PF) 100 MG IJ SOLR
100.0000 mg | Freq: Once | INTRAMUSCULAR | Status: AC
  Administered 2024-10-02: 100 mg via INTRAVENOUS
  Filled 2024-10-02: qty 2

## 2024-10-02 MED ORDER — ONDANSETRON HCL 4 MG PO TABS: 4.0000 mg | ORAL_TABLET | Freq: Four times a day (QID) | ORAL | Status: DC | PRN

## 2024-10-02 MED ORDER — LACTATED RINGERS IV BOLUS
1000.0000 mL | Freq: Once | INTRAVENOUS | Status: AC
  Administered 2024-10-02: 1000 mL via INTRAVENOUS

## 2024-10-02 MED ORDER — ACETAMINOPHEN 325 MG PO TABS
650.0000 mg | ORAL_TABLET | Freq: Once | ORAL | Status: AC
  Administered 2024-10-02: 650 mg via ORAL
  Filled 2024-10-02: qty 2

## 2024-10-02 MED ORDER — ONDANSETRON HCL 4 MG/2ML IJ SOLN: 4.0000 mg | Freq: Four times a day (QID) | INTRAMUSCULAR | Status: DC | PRN

## 2024-10-02 MED ORDER — VANCOMYCIN HCL IN DEXTROSE 1-5 GM/200ML-% IV SOLN
1000.0000 mg | Freq: Once | INTRAVENOUS | Status: AC
  Administered 2024-10-02: 1000 mg via INTRAVENOUS
  Filled 2024-10-02: qty 200

## 2024-10-02 MED ORDER — OSELTAMIVIR PHOSPHATE 75 MG PO CAPS
75.0000 mg | ORAL_CAPSULE | Freq: Two times a day (BID) | ORAL | Status: DC
  Administered 2024-10-03 – 2024-10-04 (×4): 75 mg via ORAL
  Filled 2024-10-02 (×4): qty 1

## 2024-10-02 MED ORDER — METRONIDAZOLE 500 MG/100ML IV SOLN
500.0000 mg | Freq: Once | INTRAVENOUS | Status: AC
  Administered 2024-10-02: 500 mg via INTRAVENOUS
  Filled 2024-10-02: qty 100

## 2024-10-02 MED ORDER — DM-GUAIFENESIN ER 30-600 MG PO TB12
1.0000 | ORAL_TABLET | Freq: Two times a day (BID) | ORAL | Status: DC
  Administered 2024-10-02 – 2024-10-04 (×4): 1 via ORAL
  Filled 2024-10-02 (×4): qty 1

## 2024-10-02 MED ORDER — METHYLPREDNISOLONE SODIUM SUCC 40 MG IJ SOLR
40.0000 mg | Freq: Two times a day (BID) | INTRAMUSCULAR | Status: DC
  Administered 2024-10-03 – 2024-10-04 (×3): 40 mg via INTRAVENOUS
  Filled 2024-10-02 (×3): qty 1

## 2024-10-02 MED ORDER — PANTOPRAZOLE SODIUM 40 MG IV SOLR
40.0000 mg | Freq: Two times a day (BID) | INTRAVENOUS | Status: DC
  Administered 2024-10-02 – 2024-10-03 (×2): 40 mg via INTRAVENOUS
  Filled 2024-10-02 (×2): qty 10

## 2024-10-02 MED ORDER — IPRATROPIUM BROMIDE 0.02 % IN SOLN: 0.5000 mg | RESPIRATORY_TRACT | Status: DC | PRN

## 2024-10-02 MED ORDER — ENOXAPARIN SODIUM 40 MG/0.4ML IJ SOSY
40.0000 mg | PREFILLED_SYRINGE | Freq: Every day | INTRAMUSCULAR | Status: DC
  Administered 2024-10-02 – 2024-10-03 (×2): 40 mg via SUBCUTANEOUS
  Filled 2024-10-02 (×2): qty 0.4

## 2024-10-02 MED ORDER — LACTATED RINGERS IV SOLN: INTRAVENOUS | Status: AC

## 2024-10-02 MED ORDER — ACETAMINOPHEN 325 MG PO TABS: 650.0000 mg | ORAL_TABLET | Freq: Four times a day (QID) | ORAL | Status: DC | PRN

## 2024-10-02 MED ORDER — ACETAMINOPHEN 650 MG RE SUPP: 650.0000 mg | Freq: Four times a day (QID) | RECTAL | Status: DC | PRN

## 2024-10-02 NOTE — ED Provider Notes (Signed)
 " [facility] AT [facility] Provider Note   CSN: [id number] Arrival date & time: 10/02/24  1532     Patient presents with: Weakness   Bradley Goodman is a 68 y.o. male.    Weakness Patient here for weakness.  Has had some.  Done last night.  No nausea or vomiting.  History of lung cancer with chemotherapy earlier this week.  Reportedly had elevated temperature at home.  Occasional cough.  No dysuria.    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 Authorizing Provider  acetaminophen  (TYLENOL ) 500 MG tablet Take 500 mg by mouth every 6 (six) hours as needed for mild pain (pain score 1-3) or fever.    [provider]  chlorproMAZINE  (THORAZINE ) 10 MG tablet Take 1 tablet (10 mg total) by mouth 3 (three) times daily as needed for hiccoughs. 11/27/23   [name], FNP  cholecalciferol  (VITAMIN D ) 1000 UNITS tablet Take 1,000 Units by mouth every evening.    [provider]  colchicine  0.6 MG tablet Take 1 tablet (0.6 mg total) by mouth 2 (two) times daily. 08/11/24   Sheron Lorette GRADE, PA-C  Continuous Glucose Sensor (FREESTYLE LIBRE 3 PLUS SENSOR) MISC Change sensor every 15 days. 09/07/24   [name], FNP  dexamethasone  (DECADRON ) 1 MG tablet Take 1 tablet (1 mg total) by mouth daily with breakfast. 05/04/24   Nida, Gebreselassie W, MD  famotidine  (PEPCID ) 10 MG tablet Take 10 mg by mouth at bedtime. Chewable    [provider]  folic acid  (FOLVITE ) 1 MG tablet Take 1  tablet (1 mg total) by mouth daily. 07/16/24   [name], FNP  Krill Oil 300 MG CAPS Take 300 mg by mouth every evening.    [provider]  levothyroxine  (SYNTHROID ) 112 MCG tablet Take 1 tablet (112 mcg total) by mouth daily before breakfast. 07/15/24   Nida, Gebreselassie W, MD  loratadine  (CLARITIN ) 10 MG tablet Take 10 mg by mouth every evening.     [provider]  mirtazapine  (REMERON ) 15 MG tablet Take 1 tablet (15 mg total) by mouth at bedtime. 04/22/24   [name], FNP  Multiple Vitamins-Minerals (ONE A DAY MEN 50 PLUS) TABS Take 1 tablet by mouth at bedtime.    [provider]  ondansetron  (ZOFRAN ) 4 MG tablet Take 2 tablets (8 mg total) by mouth every 8 (eight) hours as needed for nausea. 07/23/24   [name], FNP  pantoprazole  (PROTONIX ) 40 MG tablet Take 1 tablet (40 mg total) by mouth every evening. 07/16/24   [name], FNP  polyethylene glycol powder (GLYCOLAX /MIRALAX ) 17 GM/SCOOP powder Take 119 g by mouth daily as needed for mild constipation or moderate constipation.    [provider]  rosuvastatin  (CRESTOR ) 20 MG tablet Take 1 tablet (20 mg total) by mouth daily. 07/16/24   Severa Rock  M, FNP  SYRINGE-NEEDLE, DISP, 3 ML (B-D 3CC LUER-LOK SYR 21GX1-1/2) 21G X 1-1/2 3 ML MISC Use to inject testosterone  every week 03/10/24   Nida, Gebreselassie W, MD  testosterone  cypionate (DEPOTESTOTERONE CYPIONATE) 100 MG/ML injection Take 50mg  ( 0.65ml) and 100mg  ( 1ml) into muscle alternatively weekly. 07/15/24   Nida, Gebreselassie W, MD  venlafaxine  XR (EFFEXOR -XR) 75 MG 24 hr capsule Take 2 capsules (150 mg total) by mouth daily with breakfast. Patient taking differently: Take 150 mg by mouth at bedtime. 04/22/24   [name], FNP    Allergies: Patient has no known allergies.    Review of Systems  Neurological:  Positive for weakness.    Updated Vital Signs BP 137/88   Pulse (!) 125   Temp (!) 101.4 F (38.6 C) (Oral)   Resp (!)  29   Ht 5' 11 (1.803 m)   Wt 88 kg   SpO2 91%   BMI 27.06 kg/m   Physical Exam Vitals and nursing note reviewed.  Cardiovascular:     Rate and Rhythm: Tachycardia present.  Pulmonary:     Breath sounds: No wheezing.  Abdominal:     Tenderness: There is no abdominal tenderness.  Musculoskeletal:        General: No tenderness.  Skin:    General: Skin is warm.  Neurological:     Mental Status: He is alert and oriented to person, place, and time.     (all labs ordered are listed, but only abnormal results are displayed) Labs Reviewed  RESP PANEL BY RT-PCR (RSV, FLU A&B, COVID)  RVPGX2 - Abnormal; Notable for the following components:      Result Value   Influenza A by PCR POSITIVE (*)    All other components within normal limits  COMPREHENSIVE METABOLIC PANEL WITH GFR - Abnormal; Notable for the following components:   Glucose, Bld 115 (*)    Creatinine, Ser 1.27 (*)    AST 51 (*)    All other components within normal limits  CBC - Abnormal; Notable for the following components:   WBC 11.7 (*)    Hemoglobin 10.6 (*)    HCT 38.4 (*)    MCH 23.1 (*)    MCHC 27.6 (*)    RDW 21.6 (*)    Platelets 422 (*)    All other components within normal limits  URINALYSIS, ROUTINE W REFLEX MICROSCOPIC - Abnormal; Notable for the following components:   Hgb urine dipstick SMALL (*)    Protein, ur 30 (*)    All other components within normal limits  LACTIC ACID, PLASMA - Abnormal; Notable for the following components:   Lactic Acid, Venous 2.1 (*)    All other components within normal limits  DIFFERENTIAL - Abnormal; Notable for the following components:   Neutro Abs 11.0 (*)    Lymphs Abs 0.6 (*)    All other components within normal limits  CULTURE, BLOOD (ROUTINE X 2)  CULTURE, BLOOD (ROUTINE X 2)  LACTIC ACID, PLASMA    EKG: None  Radiology: DG Chest Portable 1 View Result Date: 10/02/2024 EXAM: 1 VIEW(S) XRAY OF THE CHEST 10/02/2024 05:02:00 PM COMPARISON: 07/25/2024  CLINICAL HISTORY: sob FINDINGS: LUNGS AND PLEURA: There is some increased central interstitial markings. Small nodular densities in the right upper lobe are unchanged from prior CT. There is a stable small right pleural effusion. The left costophrenic angle has been excluded. No pneumothorax. HEART AND MEDIASTINUM: No acute abnormality of the cardiac and mediastinal silhouettes. BONES  AND SOFT TISSUES: No acute osseous abnormality. IMPRESSION: 1. Stable small right pleural effusion. 2. Increased central interstitial markings. 3. Stable small nodular densities in the right upper lobe. Electronically signed by: Greig Pique MD 10/02/2024 06:18 PM EST RP Workstation: [id number]     Procedures   Medications Ordered in the ED  lactated ringers  infusion ( Intravenous New Bag/Given 10/02/24 1905)  sodium chloride  0.9 % bolus 1,000 mL (0 mLs Intravenous Stopped 10/02/24 1752)  ceFEPIme  (MAXIPIME ) 2 g in sodium chloride  0.9 % 100 mL IVPB (0 g Intravenous Stopped 10/02/24 1919)  metroNIDAZOLE  (FLAGYL ) IVPB 500 mg (0 mg Intravenous Stopped 10/02/24 1949)  vancomycin  (VANCOCIN ) IVPB 1000 mg/200 mL premix (1,000 mg Intravenous New Bag/Given 10/02/24 1905)  acetaminophen  (TYLENOL ) tablet 650 mg (650 mg Oral Given 10/02/24 1855)  lactated ringers  bolus 1,000 mL (0 mLs Intravenous Stopped 10/02/24 1952)                                    Medical Decision Making Amount and/or Complexity of Data Reviewed Labs: ordered. Radiology: ordered.  Risk OTC drugs. Prescription drug management. Decision regarding hospitalization.   Patient with occasional cough.  Tachycardia and is on chemotherapy.  Not hypoxic.  Blood pressure maintained.  Reported temperature at home although not febrile here.  Differential diagnosis includes infection but also causes such as PE or pneumothorax.  X-ray showed little interstitial markings.  Will get basic blood work also.   Patient is positive for flu.  Continued tachycardia.   I think likely from the influenza.  Initial empiric antibiotics been given once he develops a fever.  With continued tachycardia I think patient benefit from admission to the hospital.  Will discuss with hospitalist.  Patient is high risk male chemotherapy.  CRITICAL CARE Performed by: [place] Total critical care time: 30 minutes Critical care time was exclusive of separately billable procedures and treating other patients. Critical care was necessary to treat or prevent imminent or life-threatening deterioration. Critical care was time spent personally by me on the following activities: development of treatment plan with patient and/or surrogate as well as nursing, discussions with consultants, evaluation of patient's response to treatment, examination of patient, obtaining history from patient or surrogate, ordering and performing treatments and interventions, ordering and review of laboratory studies, ordering and review of radiographic studies, pulse oximetry and re-evaluation of patient's condition.      Final diagnoses:  Influenza A    ED Discharge Orders     None          River Rankin, MD 10/02/24 2012  "

## 2024-10-02 NOTE — ED Notes (Addendum)
 Unsuccessful attempts of IV access by this nurse and another nurse. Will attempt to get US  IV.

## 2024-10-02 NOTE — Progress Notes (Signed)
" °   10/02/24 2127 10/02/24 2214 10/02/24 2224  Assess: MEWS Score  Temp 99.3 F (37.4 C)  --  (!) 100.4 F (38 C)  BP (!) 162/97  --  (!) 142/87  MAP (mmHg) 112  --  99  Pulse Rate (!) 126  --  (!) 123  Resp (!) 28  --  20  SpO2 96 %  --  94 %  O2 Device Nasal Cannula  --  Nasal Cannula  O2 Flow Rate (L/min) 2 L/min  --  2 L/min  Assess: MEWS Score  MEWS Temp 0  --  0  MEWS Systolic 0  --  0  MEWS Pulse 2  --  2  MEWS RR 2  --  0  MEWS LOC 0  --  0  MEWS Score 4  --  2  MEWS Score Color Red  --  Yellow  Assess: if the MEWS score is Yellow or Red  Were vital signs accurate and taken at a resting state? Yes  --   --   Does the patient meet 2 or more of the SIRS criteria? Yes  --   --   Does the patient have a confirmed or suspected source of infection? Yes  --   --   MEWS guidelines implemented  Yes, red  --   --   Treat  MEWS Interventions Considered administering scheduled or prn medications/treatments as ordered  --   --   Take Vital Signs  Increase Vital Sign Frequency  Red: Q1hr x2, continue Q4hrs until patient remains green for 12hrs  --   --   Escalate  MEWS: Escalate Red: Discuss with charge nurse and notify provider. Consider notifying RRT. If remains red for 2 hours consider need for higher level of care  --   --   Notify: Charge Nurse/RN  Name of Charge Nurse/RN Notified [place]  --   --   Provider Notification  Provider Name/Title B. Jesus NP Dr. Manfred  --   Date Provider Notified 10/02/24 10/02/24  --   Time Provider Notified 2147 2214  --   Notification Reason Other (Comment) (mews 4) Other (Comment) (red mews. Orders still being placed. Wheezing questioned neb orders.)  --   Provider response No new orders See new orders  --   Date of Provider Response 10/02/24 10/02/24  --   Time of Provider Response 2150 2214  --   Assess: SIRS CRITERIA  SIRS Temperature  0  --  0  SIRS Respirations  1  --  0  SIRS Pulse 1  --  1  SIRS WBC 0  --  0  SIRS Score Sum  2   --  1   "

## 2024-10-02 NOTE — Consult Note (Signed)
 CODE SEPSIS - PHARMACY COMMUNICATION  **Broad Spectrum Antibiotics should be administered within 1 hour of Sepsis diagnosis**  Time Code Sepsis Called/Page Received: 1836  Antibiotics Ordered: cefepime , metronidazole , vancomycin    Time of 1st antibiotic administration: 1849  Additional action taken by pharmacy: none  If necessary, Name of Provider/Nurse Contacted: n/a    Annabella LOISE Banks ,PharmD Clinical Pharmacist  10/02/2024  6:46 PM

## 2024-10-02 NOTE — Progress Notes (Signed)
 Elink is following code sepsis.

## 2024-10-02 NOTE — ED Notes (Signed)
 Date and time results received: 10/02/2024 1836  Test: Lactic Acid Critical Value: 2.1  Name of Provider Notified: Patsey Lot. MD

## 2024-10-02 NOTE — ED Triage Notes (Signed)
 Pt c/o weakness starting last night and into today. Pt denies n/v. Pt has hx of lung cancer.

## 2024-10-02 NOTE — H&P (Signed)
 " History and Physical    Patient: Bradley Goodman FMW:[id number] DOB: [birth year] DOA: 10/02/2024 DOS: the patient was seen and examined on 10/02/2024 PCP: [name], FNP  Patient coming from: Home  Chief Complaint:  Chief Complaint  Patient presents with   Weakness   HPI: Bradley Goodman is a 68 y.o. male with medical history significant of hyperlipidemia, hypothyroidism, stage IV NSCLC (diagnosed in 03/2021), adrenal insufficiency, depression who presents to the emergency department due to worsening generalized weakness which started last night.  He also complained of occasional cough, but denies nausea, vomiting.  Patient states that he had chemotherapy earlier this week.  ED course In the emergency department, he was febrile, tachycardic and tachypneic.  Workup in the ED showed leukocytosis and normocytic anemia and normal BMP except for creatinine of 1.27.  Lactic acid 2.1 > 1.5.  Urinalysis was normal.  Influenza A was positive Chest x-ray shows stable small right pleural effusion.  Increased central interstitial markings.  Stable small nodular densities in the RUL. Tylenol  was given, patient was empirically started on IV cefepime , Flagyl  and vancomycin .  IV hydration was provided.  TRH was asked to admit patient.   Review of Systems: As mentioned in the history of present illness. All other systems reviewed and are negative. 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    Lung cancer (HCC)    PONV (postoperative nausea and vomiting)    Type 2 diabetes mellitus (HCC)    Past Surgical History:  Procedure Laterality Date   Bilateral inguinal hernia repair     BRONCHIAL NEEDLE ASPIRATION BIOPSY   04/05/2021   Procedure: BRONCHIAL NEEDLE ASPIRATION BIOPSIES;  Surgeon: Brenna Adine CROME, DO;  Location: [facility];  Service: Pulmonary;;   COLONOSCOPY  01/23/2012   Procedure: COLONOSCOPY;  Surgeon: Lamar HERO Hollingshead, MD;  Location: [facility];  Service: Endoscopy;  Laterality: N/A;  9:30 AM   COLONOSCOPY N/A 10/11/2015   Procedure: COLONOSCOPY;  Surgeon: Lamar HERO Hollingshead, MD;  Location: [facility];  Service: Endoscopy;  Laterality: N/A;  830   COLONOSCOPY N/A 10/14/2019   Procedure: COLONOSCOPY;  Surgeon: Hollingshead Lamar HERO, MD;  Location: [facility];  Service: Endoscopy;  Laterality: N/A;  1:45   HERNIA REPAIR     POLYPECTOMY  10/14/2019   Procedure: POLYPECTOMY;  Surgeon: Hollingshead Lamar HERO, MD;  Location: [facility];  Service: Endoscopy;;  ascending colon, descending colon   VIDEO BRONCHOSCOPY WITH ENDOBRONCHIAL ULTRASOUND N/A 04/05/2021   Procedure: VIDEO BRONCHOSCOPY WITH ENDOBRONCHIAL ULTRASOUND;  Surgeon: Brenna Adine CROME, DO;  Location: [facility] ENDOS[facility];  Service: Pulmonary;  Laterality: N/A;   Social History:  reports that he has been smoking cigarettes. He started smoking about 50 years ago. He has a 25.5 pack-year smoking history. He has never used smokeless tobacco. He reports that he does not currently use alcohol. He reports that he does not use drugs.  Allergies[1]  Family History  Problem Relation Age of Onset   Hypertension Mother    Diabetes Mother    Heart attack Mother    Heart disease Mother    Hypertension Father  Heart attack Father    COPD Father    Heart disease Father    Heart attack Brother    Heart disease Brother    Breast cancer Maternal Aunt    Cancer Maternal Aunt        uterine vs ovarain   Stroke Maternal Grandmother    Stroke Maternal Grandfather    Colon cancer Neg Hx     Prior to Admission medications  Medication Sig Start Date End Date Taking? Authorizing Provider  acetaminophen  (TYLENOL ) 500 MG tablet Take 500 mg by mouth every 6  (six) hours as needed for mild pain (pain score 1-3) or fever.    [provider]  chlorproMAZINE  (THORAZINE ) 10 MG tablet Take 1 tablet (10 mg total) by mouth 3 (three) times daily as needed for hiccoughs. 11/27/23   [name], FNP  cholecalciferol  (VITAMIN D ) 1000 UNITS tablet Take 1,000 Units by mouth every evening.    [provider]  colchicine  0.6 MG tablet Take 1 tablet (0.6 mg total) by mouth 2 (two) times daily. 08/11/24   Sheron Lorette GRADE, PA-C  Continuous Glucose Sensor (FREESTYLE LIBRE 3 PLUS SENSOR) MISC Change sensor every 15 days. 09/07/24   [name], FNP  dexamethasone  (DECADRON ) 1 MG tablet Take 1 tablet (1 mg total) by mouth daily with breakfast. 05/04/24   Nida, Gebreselassie W, MD  famotidine  (PEPCID ) 10 MG tablet Take 10 mg by mouth at bedtime. Chewable    [provider]  folic acid  (FOLVITE ) 1 MG tablet Take 1 tablet (1 mg total) by mouth daily. 07/16/24   [name], FNP  Krill Oil 300 MG CAPS Take 300 mg by mouth every evening.    [provider]  levothyroxine  (SYNTHROID ) 112 MCG tablet Take 1 tablet (112 mcg total) by mouth daily before breakfast. 07/15/24   Nida, Ethelle ORN, MD  loratadine  (CLARITIN ) 10 MG tablet Take 10 mg by mouth every evening.     [provider]  mirtazapine  (REMERON ) 15 MG tablet Take 1 tablet (15 mg total) by mouth at bedtime. 04/22/24   [name], FNP  Multiple Vitamins-Minerals (ONE A DAY MEN 50 PLUS) TABS Take 1 tablet by mouth at bedtime.    [provider]  ondansetron  (ZOFRAN ) 4 MG tablet Take 2 tablets (8 mg total) by mouth every 8 (eight) hours as needed for nausea. 07/23/24   [name], FNP  pantoprazole  (PROTONIX ) 40 MG tablet Take 1 tablet (40 mg total) by mouth every evening. 07/16/24   [name], FNP  polyethylene glycol powder (GLYCOLAX /MIRALAX ) 17 GM/SCOOP powder Take 119 g by mouth daily as needed for mild constipation or moderate constipation.     [provider]  rosuvastatin  (CRESTOR ) 20 MG tablet Take 1 tablet (20 mg total) by mouth daily. 07/16/24   [name], [name], FNP  SYRINGE-NEEDLE, DISP, 3 ML (B-D 3CC LUER-LOK SYR 21GX1-1/2) 21G X 1-1/2 3 ML MISC Use to inject testosterone  every week 03/10/24   Nida, Gebreselassie W, MD  testosterone  cypionate (DEPOTESTOTERONE CYPIONATE) 100 MG/ML injection Take 50mg  ( 0.69ml) and 100mg  ( 1ml) into muscle alternatively weekly. 07/15/24   Nida, Gebreselassie W, MD  venlafaxine  XR (EFFEXOR -XR) 75 MG 24 hr capsule Take 2 capsules (150 mg total) by mouth daily with breakfast. Patient taking differently: Take 150 mg by mouth at bedtime. 04/22/24   [name], FNP    Physical Exam: Vitals:   10/02/24 2000 10/02/24 2003 10/02/24 2015 10/02/24 2030  BP:  137/88 139/88 139/83  Pulse: (!) 125 (!) 125 (!) 128 (!) 124  Resp: (!) 26 (!) 29 (!) 28 (!) 23  Temp:      TempSrc:      SpO2: 91% 91% 92% 92%  Weight:      Height:       General: Awake and alert and oriented x3. Not in any acute distress.  HEENT: NCAT.  PERRLA. EOMI. Sclerae anicteric.  Moist mucosal membranes. Neck: Neck supple without lymphadenopathy. No carotid bruits. No masses palpated.  Cardiovascular: Tachycardia.  Regular rate with normal S1-S2 sounds. No murmurs, rubs or gallops auscultated. No JVD.  Respiratory: Tachypnea.  Coarse breath sounds and diffuse expiratory wheezes.   Abdomen: Soft, nontender, nondistended. Active bowel sounds. No masses or hepatosplenomegaly  Skin: No rashes, lesions, or ulcerations.  Dry, warm to touch. Musculoskeletal:  2+ dorsalis pedis and radial pulses. Good ROM.  No contractures  Psychiatric: Intact judgment and insight.  Mood appropriate to current condition. Neurologic: No focal neurological deficits. Strength is 5/5 x 4.  CN II - XII grossly intact.  Assessment and Plan: Influenza A Continue Tamiflu , Mucinex , Atrovent , Solu-Medrol  40 mg twice daily Continue incentive spirometry and  flutter valve  Sepsis due to unspecified organism This may be due to above Patient was febrile, tachypneic and tachycardic (met SIRS criteria) Patient was empirically started on IV vancomycin , cefepime  and Flagyl , we shall continue with IV vancomycin  and Zosyn  with plan to de-escalate/discontinue based on blood culture.  Lactic acidosis-resolved Lactic acid 2.1 > 1.5  Generalized weakness possibly due to influenza and recent chemotherapy Protein supplements will be provided Continue fall precaution Continue PT/OT eval and treat  Essential hypertension (controlled) Continue IV hydralazine  10 mg every 6 hours as needed for SBP > 170  Mixed hyperlipidemia Continue Crestor    Stage 4 NSCLC Last visit on 09/29/2024 He follows up with Dr. 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 Inpatient status is judged to be reasonable and necessary in order to provide the required intensity of service to ensure the patient's safety. The patient's presenting symptoms, physical exam findings, and initial radiographic and laboratory data in the context of their chronic comorbidities is felt to place them at high risk for further clinical deterioration. Furthermore, it is not anticipated that the patient will be medically stable for discharge from the hospital within 2 midnights of admission.   * I certify that at the point of admission it is my clinical judgment that the patient will require inpatient hospital care spanning beyond 2 midnights from the point of admission due to high intensity of service, high risk for further deterioration and high frequency of  surveillance required.*  Author: Jesus Nevills, DO 10/02/2024 8:48 PM  For on call review [email or web address].      [1] No Known Allergies  "

## 2024-10-02 NOTE — H&P (Incomplete)
 " History and Physical    Patient: Bradley Goodman FMW:[id number] DOB: 1956/08/21 DOA: 10/02/2024 DOS: the patient was seen and examined on 10/02/2024 PCP: [name], FNP  Patient coming from: Home  Chief Complaint:  Chief Complaint  Patient presents with   Weakness   HPI: Bradley Goodman is a 68 y.o. male with medical history significant of hyperlipidemia, hypothyroidism, stage IV NSCLC (diagnosed in 03/2021), adrenal insufficiency, depression who presents to the emergency department due to worsening generalized weakness which started last night.  He also complained of occasional cough, but denies nausea, vomiting.  Patient states that he had chemotherapy earlier this week.  ED course In the emergency department, he was febrile, tachycardic and tachypneic.  Workup in the ED showed leukocytosis and normocytic anemia and normal BMP except for creatinine of 1.27.  Lactic acid 2.1 > 1.5.  Urinalysis was normal.  Influenza A was positive Chest x-ray shows stable small right pleural effusion.  Increased central interstitial markings.  Stable small nodular densities in the RUL. Tylenol  was given, patient was empirically started on IV cefepime , Flagyl  and vancomycin .  IV hydration was provided.  TRH was asked to admit patient.   Review of Systems: As mentioned in the history of present illness. All other systems reviewed and are negative. Past Medical History:  Diagnosis Date   Brain cancer [facility])    Cervical spondylolysis    Depression    Essential hypertension    Family history of breast cancer    GERD (gastroesophageal reflux disease)    History of kidney stones    History of migraine    Hyperlipidemia    Hypertension    lung ca with mets to brain    Lung cancer (HCC)    PONV (postoperative nausea and vomiting)    Type 2 diabetes mellitus (HCC)    Past Surgical History:  Procedure Laterality Date   Bilateral inguinal hernia repair     BRONCHIAL NEEDLE  ASPIRATION BIOPSY  04/05/2021   Procedure: BRONCHIAL NEEDLE ASPIRATION BIOPSIES;  Surgeon: Brenna Adine CROME, DO;  Location: [facility];  Service: Pulmonary;;   COLONOSCOPY  01/23/2012   Procedure: COLONOSCOPY;  Surgeon: Lamar HERO Hollingshead, MD;  Location: [facility];  Service: Endoscopy;  Laterality: N/A;  9:30 AM   COLONOSCOPY N/A 10/11/2015   Procedure: COLONOSCOPY;  Surgeon: Lamar HERO Hollingshead, MD;  Location: [facility];  Service: Endoscopy;  Laterality: N/A;  830   COLONOSCOPY N/A 10/14/2019   Procedure: COLONOSCOPY;  Surgeon: Hollingshead Lamar HERO, MD;  Location: [facility];  Service: Endoscopy;  Laterality: N/A;  1:45   HERNIA REPAIR     POLYPECTOMY  10/14/2019   Procedure: POLYPECTOMY;  Surgeon: Hollingshead Lamar HERO, MD;  Location: [facility];  Service: Endoscopy;;  ascending colon, descending colon   VIDEO BRONCHOSCOPY WITH ENDOBRONCHIAL ULTRASOUND N/A 04/05/2021   Procedure: VIDEO BRONCHOSCOPY WITH ENDOBRONCHIAL ULTRASOUND;  Surgeon: Brenna Adine CROME, DO;  Location: [facility];  Service: Pulmonary;  Laterality: N/A;   Social History:  reports that he has been smoking cigarettes. He started smoking about 50 years ago. He has a 25.5 pack-year smoking history. He has never used smokeless tobacco. He reports that he does not currently use alcohol. He reports that he does not use drugs.  Allergies[1]  Family History  Problem Relation Age of Onset   Hypertension Mother    Diabetes Mother    Heart attack Mother    Heart disease Mother    Hypertension Father  Heart attack Father    COPD Father    Heart disease Father    Heart attack Brother    Heart disease Brother    Breast cancer Maternal Aunt    Cancer Maternal Aunt        uterine vs ovarain   Stroke Maternal Grandmother    Stroke Maternal Grandfather    Colon cancer Neg Hx     Prior to Admission medications  Medication Sig Start Date End Date Taking? Authorizing Provider  acetaminophen  (TYLENOL ) 500 MG  tablet Take 500 mg by mouth every 6 (six) hours as needed for mild pain (pain score 1-3) or fever.    [provider]  chlorproMAZINE  (THORAZINE ) 10 MG tablet Take 1 tablet (10 mg total) by mouth 3 (three) times daily as needed for hiccoughs. 11/27/23   [name], FNP  cholecalciferol  (VITAMIN D ) 1000 UNITS tablet Take 1,000 Units by mouth every evening.    [provider]  colchicine  0.6 MG tablet Take 1 tablet (0.6 mg total) by mouth 2 (two) times daily. 08/11/24   Sheron Lorette GRADE, PA-C  Continuous Glucose Sensor (FREESTYLE LIBRE 3 PLUS SENSOR) MISC Change sensor every 15 days. 09/07/24   [name], FNP  dexamethasone  (DECADRON ) 1 MG tablet Take 1 tablet (1 mg total) by mouth daily with breakfast. 05/04/24   Nida, Gebreselassie W, MD  famotidine  (PEPCID ) 10 MG tablet Take 10 mg by mouth at bedtime. Chewable    [provider]  folic acid  (FOLVITE ) 1 MG tablet Take 1 tablet (1 mg total) by mouth daily. 07/16/24   [name], FNP  Krill Oil 300 MG CAPS Take 300 mg by mouth every evening.    [provider]  levothyroxine  (SYNTHROID ) 112 MCG tablet Take 1 tablet (112 mcg total) by mouth daily before breakfast. 07/15/24   Nida, Gebreselassie W, MD  loratadine  (CLARITIN ) 10 MG tablet Take 10 mg by mouth every evening.     [provider]  mirtazapine  (REMERON ) 15 MG tablet Take 1 tablet (15 mg total) by mouth at bedtime. 04/22/24   [name], FNP  Multiple Vitamins-Minerals (ONE A DAY MEN 50 PLUS) TABS Take 1 tablet by mouth at bedtime.    [provider]  ondansetron  (ZOFRAN ) 4 MG tablet Take 2 tablets (8 mg total) by mouth every 8 (eight) hours as needed for nausea. 07/23/24   [name], FNP  pantoprazole  (PROTONIX ) 40 MG tablet Take 1 tablet (40 mg total) by mouth every evening. 07/16/24   [name], FNP  polyethylene glycol powder (GLYCOLAX /MIRALAX ) 17 GM/SCOOP powder Take 119 g by mouth daily as needed for mild  constipation or moderate constipation.    [provider]  rosuvastatin  (CRESTOR ) 20 MG tablet Take 1 tablet (20 mg total) by mouth daily. 07/16/24   [name], [name], FNP  SYRINGE-NEEDLE, DISP, 3 ML (B-D 3CC LUER-LOK SYR 21GX1-1/2) 21G X 1-1/2 3 ML MISC Use to inject testosterone  every week 03/10/24   Nida, Gebreselassie W, MD  testosterone  cypionate (DEPOTESTOTERONE CYPIONATE) 100 MG/ML injection Take 50mg  ( 0.61ml) and 100mg  ( 1ml) into muscle alternatively weekly. 07/15/24   Nida, Gebreselassie W, MD  venlafaxine  XR (EFFEXOR -XR) 75 MG 24 hr capsule Take 2 capsules (150 mg total) by mouth daily with breakfast. Patient taking differently: Take 150 mg by mouth at bedtime. 04/22/24   [name], FNP    Physical Exam: Vitals:   10/02/24 2000 10/02/24 2003 10/02/24 2015 10/02/24 2030  BP:  137/88 139/88 139/83  Pulse: (!) 125 (!) 125 (!) 128 (!) 124  Resp: (!) 26 (!) 29 (!) 28 (!) 23  Temp:      TempSrc:      SpO2: 91% 91% 92% 92%  Weight:      Height:       General: Awake and alert and oriented x3. Not in any acute distress.  HEENT: NCAT.  PERRLA. EOMI. Sclerae anicteric.  Moist mucosal membranes. Neck: Neck supple without lymphadenopathy. No carotid bruits. No masses palpated.  Cardiovascular: Tachycardia.  Regular rate with normal S1-S2 sounds. No murmurs, rubs or gallops auscultated. No JVD.  Respiratory: Tachypnea.  Coarse breath sounds and diffuse expiratory wheezes.   Abdomen: Soft, nontender, nondistended. Active bowel sounds. No masses or hepatosplenomegaly  Skin: No rashes, lesions, or ulcerations.  Dry, warm to touch. Musculoskeletal:  2+ dorsalis pedis and radial pulses. Good ROM.  No contractures  Psychiatric: Intact judgment and insight.  Mood appropriate to current condition. Neurologic: No focal neurological deficits. Strength is 5/5 x 4.  CN II - XII grossly intact.  Assessment and Plan: Influenza A Continue Tamiflu , Mucinex , Atrovent , Solu-Medrol  40 mg twice  daily Continue sinus telemetry on flutter valve  Sepsis due to unspecified organism Patient was febrile, tachypneic and tachycardic (met SIRS criteria)   Advance Care Planning:   Code Status: Prior ***  Consults: ***  Family Communication: ***  Severity of Illness: {Observation/Inpatient:21159}  Author: Posey Maier, DO 10/02/2024 8:48 PM  For on call review [email or web address].        [1] No Known Allergies "

## 2024-10-03 DIAGNOSIS — J101 Influenza due to other identified influenza virus with other respiratory manifestations: Secondary | ICD-10-CM | POA: Diagnosis not present

## 2024-10-03 LAB — MAGNESIUM: Magnesium: 1.8 mg/dL (ref 1.7–2.4)

## 2024-10-03 LAB — CBC
HCT: 28.8 % — ABNORMAL LOW (ref 39.0–52.0)
Hemoglobin: 8.2 g/dL — ABNORMAL LOW (ref 13.0–17.0)
MCH: 23.4 pg — ABNORMAL LOW (ref 26.0–34.0)
MCHC: 28.5 g/dL — ABNORMAL LOW (ref 30.0–36.0)
MCV: 82.3 fL (ref 80.0–100.0)
Platelets: 307 K/uL (ref 150–400)
RBC: 3.5 MIL/uL — ABNORMAL LOW (ref 4.22–5.81)
RDW: 21.2 % — ABNORMAL HIGH (ref 11.5–15.5)
WBC: 9.8 K/uL (ref 4.0–10.5)
nRBC: 0 % (ref 0.0–0.2)

## 2024-10-03 LAB — COMPREHENSIVE METABOLIC PANEL WITH GFR
ALT: 24 U/L (ref 0–44)
AST: 32 U/L (ref 15–41)
Albumin: 3.2 g/dL — ABNORMAL LOW (ref 3.5–5.0)
Alkaline Phosphatase: 57 U/L (ref 38–126)
Anion gap: 7 (ref 5–15)
BUN: 12 mg/dL (ref 8–23)
CO2: 29 mmol/L (ref 22–32)
Calcium: 8.1 mg/dL — ABNORMAL LOW (ref 8.9–10.3)
Chloride: 102 mmol/L (ref 98–111)
Creatinine, Ser: 0.97 mg/dL (ref 0.61–1.24)
GFR, Estimated: >60 mL/min
Glucose, Bld: 129 mg/dL — ABNORMAL HIGH (ref 70–99)
Potassium: 3.7 mmol/L (ref 3.5–5.1)
Sodium: 138 mmol/L (ref 135–145)
Total Bilirubin: 0.3 mg/dL (ref 0.0–1.2)
Total Protein: 5.7 g/dL — ABNORMAL LOW (ref 6.5–8.1)

## 2024-10-03 LAB — HEMOGLOBIN A1C
Hgb A1c MFr Bld: 6.5 % — ABNORMAL HIGH (ref 4.8–5.6)
Mean Plasma Glucose: 139.85 mg/dL

## 2024-10-03 LAB — PROCALCITONIN: Procalcitonin: 0.18 ng/mL

## 2024-10-03 LAB — PHOSPHORUS: Phosphorus: 3.2 mg/dL (ref 2.5–4.6)

## 2024-10-03 MED ORDER — FOLIC ACID 1 MG PO TABS
1.0000 mg | ORAL_TABLET | Freq: Every day | ORAL | Status: DC
  Administered 2024-10-03 – 2024-10-04 (×2): 1 mg via ORAL
  Filled 2024-10-03 (×2): qty 1

## 2024-10-03 MED ORDER — VANCOMYCIN HCL IN DEXTROSE 1-5 GM/200ML-% IV SOLN
1000.0000 mg | Freq: Two times a day (BID) | INTRAVENOUS | Status: DC
  Administered 2024-10-03: 1000 mg via INTRAVENOUS
  Filled 2024-10-03: qty 200

## 2024-10-03 MED ORDER — ENSURE PLUS HIGH PROTEIN PO LIQD: 237.0000 mL | Freq: Two times a day (BID) | ORAL | Status: DC

## 2024-10-03 MED ORDER — GLUCERNA SHAKE PO LIQD
237.0000 mL | Freq: Three times a day (TID) | ORAL | Status: DC
  Administered 2024-10-03 (×2): 237 mL via ORAL

## 2024-10-03 MED ORDER — HYDRALAZINE HCL 20 MG/ML IJ SOLN: 10.0000 mg | Freq: Four times a day (QID) | INTRAMUSCULAR | Status: DC | PRN

## 2024-10-03 MED ORDER — FAMOTIDINE 20 MG PO TABS
10.0000 mg | ORAL_TABLET | Freq: Every day | ORAL | Status: DC
  Administered 2024-10-03: 10 mg via ORAL
  Filled 2024-10-03: qty 1

## 2024-10-03 MED ORDER — VENLAFAXINE HCL ER 75 MG PO CP24
150.0000 mg | ORAL_CAPSULE | Freq: Every day | ORAL | Status: DC
  Administered 2024-10-03 – 2024-10-04 (×2): 150 mg via ORAL
  Filled 2024-10-03 (×2): qty 2

## 2024-10-03 MED ORDER — INSULIN ASPART 100 UNIT/ML IJ SOLN: 0.0000 [IU] | Freq: Three times a day (TID) | INTRAMUSCULAR | Status: DC

## 2024-10-03 MED ORDER — LEVOTHYROXINE SODIUM 112 MCG PO TABS
112.0000 ug | ORAL_TABLET | Freq: Every day | ORAL | Status: DC
  Administered 2024-10-03 – 2024-10-04 (×2): 112 ug via ORAL
  Filled 2024-10-03 (×2): qty 1

## 2024-10-03 MED ORDER — ROSUVASTATIN CALCIUM 20 MG PO TABS
20.0000 mg | ORAL_TABLET | Freq: Every day | ORAL | Status: DC
  Administered 2024-10-03 – 2024-10-04 (×2): 20 mg via ORAL
  Filled 2024-10-03 (×2): qty 1

## 2024-10-03 MED ORDER — PANTOPRAZOLE SODIUM 40 MG PO TBEC
40.0000 mg | DELAYED_RELEASE_TABLET | Freq: Every day | ORAL | Status: DC
  Administered 2024-10-03 – 2024-10-04 (×2): 40 mg via ORAL
  Filled 2024-10-03 (×2): qty 1

## 2024-10-03 MED ORDER — CALCIUM CARBONATE ANTACID 500 MG PO CHEW
1.0000 | CHEWABLE_TABLET | Freq: Two times a day (BID) | ORAL | Status: DC | PRN
  Administered 2024-10-03: 200 mg via ORAL
  Filled 2024-10-03: qty 1

## 2024-10-03 MED ORDER — PIPERACILLIN-TAZOBACTAM 3.375 G IVPB
3.3750 g | Freq: Three times a day (TID) | INTRAVENOUS | Status: DC
  Administered 2024-10-03: 3.375 g via INTRAVENOUS
  Filled 2024-10-03: qty 50

## 2024-10-03 NOTE — Plan of Care (Signed)

## 2024-10-03 NOTE — Progress Notes (Signed)
 Pharmacy Antibiotic Note  Bradley Goodman is a 68 y.o. male with lung cancer undergoing chemotherapy admitted on 10/02/2024 with fevers/sepsis.  Pharmacy has been consulted for Vancomycin  and Zosyn   dosing.  Plan: Vancomycin  1000 mg IV q12h Zosyn  3.375 g IV q8h   Height: 5' 11 (180.3 cm) Weight: 88 kg (193 lb 15.7 oz) IBW/kg (Calculated) : 75.3  Temp (24hrs), Avg:99.9 F (37.7 C), Min:99.3 F (37.4 C), Max:101.4 F (38.6 C)  Recent Labs  Lab 09/29/24 1051 10/02/24 1551 10/02/24 1758 10/02/24 1946  WBC 10.9* 11.7*  --   --   CREATININE 1.14 1.27*  --   --   LATICACIDVEN  --   --  2.1* 1.5    Estimated Creatinine Clearance: 60.1 mL/min (A) (by C-G formula based on SCr of 1.27 mg/dL (H)).    Allergies[1]   Bradley Goodman 10/03/2024 12:25 AM     [1] No Known Allergies

## 2024-10-03 NOTE — Plan of Care (Signed)
  Problem: Education: Goal: Knowledge of General Education information will improve Description: Including pain rating scale, medication(s)/side effects and non-pharmacologic comfort measures Outcome: Progressing   Problem: Clinical Measurements: Goal: Ability to maintain clinical measurements within normal limits will improve Outcome: Progressing   Problem: Clinical Measurements: Goal: Respiratory complications will improve Outcome: Progressing   Problem: Clinical Measurements: Goal: Diagnostic test results will improve Outcome: Progressing

## 2024-10-03 NOTE — Progress Notes (Signed)
 " PROGRESS NOTE    Bradley Goodman  FMW:[id number] DOB: 05-07-1956 DOA: 10/02/2024 PCP: [name], FNP   Brief Narrative:    Bradley Goodman is a 68 y.o. male with medical history significant of hyperlipidemia, hypothyroidism, stage IV NSCLC (diagnosed in 03/2021), adrenal insufficiency, depression who presents to the emergency department due to worsening generalized weakness which started last night.  He also complained of occasional cough, but denies nausea, vomiting.  Patient states that he had chemotherapy earlier this week.  Patient was admitted for influenza A infection and is also thought to have sepsis due to unspecified organism and was started on broad-spectrum IV antibiotics.  Assessment & Plan:   Principal Problem:   Influenza A  Assessment and Plan:  Influenza A Continue Tamiflu , Mucinex , Atrovent , Solu-Medrol  40 mg twice daily and wean oxygen as tolerated Continue incentive spirometry and flutter valve Discontinue antibiotics as procalcitonin was low   Lactic acidosis-resolved Lactic acid 2.1 > 1.5   Generalized weakness possibly due to influenza and recent chemotherapy Protein supplements will be provided Continue fall precaution Continue PT/OT eval and treat, ambulating to bathroom okay   Essential hypertension (controlled) Continue IV hydralazine  10 mg every 6 hours as needed for SBP > 170   Mixed hyperlipidemia Continue Crestor    Stage 4 NSCLC Last visit on 09/29/2024 He follows up with Dr. 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   Consultants:  None  Procedures:   None  Antimicrobials:  Anti-infectives (From admission, onward)    Start     Dose/Rate Route Frequency Ordered Stop   10/03/24 0600  piperacillin -tazobactam (ZOSYN ) IVPB 3.375 g        3.375 g 12.5 mL/hr over 240 Minutes Intravenous Every 8 hours 10/03/24 0024     10/03/24 0600  vancomycin  (VANCOCIN ) IVPB 1000 mg/200 mL premix        1,000 mg 200 mL/hr over 60 Minutes Intravenous Every 12 hours 10/03/24 0028     10/03/24 0045  oseltamivir  (TAMIFLU ) capsule 75 mg        75 mg Oral 2 times daily 10/02/24 2358 10/07/24 2159   10/02/24 1845  ceFEPIme  (MAXIPIME ) 2 g in sodium chloride  0.9 % 100 mL IVPB        2 g 200 mL/hr over 30 Minutes Intravenous  Once 10/02/24 1836 10/02/24 1919   10/02/24 1845  metroNIDAZOLE  (FLAGYL ) IVPB 500 mg        500 mg 100 mL/hr over 60 Minutes Intravenous  Once 10/02/24 1836 10/02/24 1949   10/02/24 1845  vancomycin  (VANCOCIN ) IVPB 1000 mg/200 mL premix        1,000 mg 200 mL/hr over 60 Minutes Intravenous  Once 10/02/24 1836 10/02/24 2005       Subjective: Patient seen and evaluated today with no new acute complaints or concerns. No acute concerns or events noted overnight.  He states he is overall feeling better and ambulating to the bathroom without any significant shortness of breath.  Objective: Vitals:   10/02/24 2224 10/02/24 2334 10/03/24 0415 10/03/24 0810  BP: (!) 142/87 121/85 138/79 (!) 144/89  Pulse: (!) 123 (!) 118 (!) 112 (!) 111  Resp: 20 20 (!) 22  Temp: (!) 100.4 F (38 C) 99.3 F (37.4 C) 98.9 F (37.2 C) 98.9 F (37.2 C)  TempSrc: Oral Oral Oral Oral  SpO2: 94% 96% 92% 95%  Weight:      Height:        Intake/Output Summary (Last 24 hours) at 10/03/2024 0844 Last data filed at 10/03/2024 0700 Gross per 24 hour  Intake 1124.4 ml  Output 300 ml  Net 824.4 ml   Filed Weights   10/02/24 1549  Weight: 88 kg    Examination:  General exam: Appears calm and comfortable  Respiratory system: Clear to auscultation.  Respiratory effort normal.  2 L nasal cannula Cardiovascular system: S1 & S2 heard, RRR.  Gastrointestinal system: Abdomen is soft Central nervous system: Alert and awake Extremities: No edema Skin: No significant lesions noted Psychiatry: Flat affect.    Data Reviewed: I have personally reviewed following labs and imaging studies  CBC: Recent Labs  Lab 09/29/24 1051 10/02/24 1551 10/03/24 0641  WBC 10.9* 11.7* 9.8  NEUTROABS 9.1* 11.0*  --   HGB 10.9* 10.6* 8.2*  HCT 38.3* 38.4* 28.8*  MCV 81.3 83.7 82.3  PLT 488* 422* 307   Basic Metabolic Panel: Recent Labs  Lab 09/29/24 1051 10/02/24 1551 10/03/24 0641  NA 144 139 138  K 4.1 4.5 3.7  CL 105 100 102  CO2 27 29 29   GLUCOSE 112* 115* 129*  BUN 13 13 12   CREATININE 1.14 1.27* 0.97  CALCIUM  9.7 9.0 8.1*  MG  --   --  1.8  PHOS  --   --  3.2   GFR: Estimated Creatinine Clearance: 78.7 mL/min (by C-G formula based on SCr of 0.97 mg/dL). Liver Function Tests: Recent Labs  Lab 09/29/24 1051 10/02/24 1551 10/03/24 0641  AST 27 51* 32  ALT 22 32 24  ALKPHOS 79 76 57  BILITOT 0.3 0.5 0.3  PROT 7.6 7.1 5.7*  ALBUMIN 4.4 3.9 3.2*   No results for input(s): LIPASE, AMYLASE in the last 168 hours. No results for input(s): AMMONIA in the last 168 hours. Coagulation Profile: No results for input(s): INR, PROTIME in the last 168 hours. Cardiac Enzymes: No results for input(s): CKTOTAL, CKMB, CKMBINDEX, TROPONINI in the last 168 hours. BNP (last 3 results) No results for input(s): PROBNP in the last 8760 hours. HbA1C: No results for input(s): HGBA1C in the last 72 hours. CBG: No results for input(s): GLUCAP in the last 168 hours. Lipid Profile: No results for input(s): CHOL, HDL, LDLCALC, TRIG, CHOLHDL, LDLDIRECT in the last 72 hours. Thyroid  Function Tests: No results for input(s): TSH, T4TOTAL, FREET4, T3FREE, THYROIDAB in the last 72 hours. Anemia Panel: No  results for input(s): VITAMINB12, FOLATE, FERRITIN, TIBC, IRON, RETICCTPCT in the last 72 hours. Sepsis Labs: Recent Labs  Lab 10/02/24 1758 10/02/24 1946  LATICACIDVEN 2.1* 1.5    Recent Results (from the past 240 hours)  Resp panel by RT-PCR (RSV, Flu A&B, Covid) Anterior Nasal Swab     Status: Abnormal   Collection Time: 10/02/24  4:27 PM   Specimen: Anterior Nasal Swab  Result Value Ref Range Status   SARS Coronavirus 2 by RT PCR NEGATIVE NEGATIVE Final    Comment: (NOTE) SARS-CoV-2 target nucleic acids are NOT DETECTED.  The SARS-CoV-2 RNA is generally detectable in upper respiratory specimens during the acute phase of infection. The lowest concentration of SARS-CoV-2 viral copies this assay can detect is 138 copies/mL. A negative result does not preclude SARS-Cov-2 infection and should not  be used as the sole basis for treatment or other patient management decisions. A negative result may occur with  improper specimen collection/handling, submission of specimen other than nasopharyngeal swab, presence of viral mutation(s) within the areas targeted by this assay, and inadequate number of viral copies(<138 copies/mL). A negative result must be combined with clinical observations, patient history, and epidemiological information. The expected result is Negative.  Fact Sheet for Patients:  bloggercourse.com  Fact Sheet for Healthcare Providers:  seriousbroker.it  This test is no t yet approved or cleared by the [place]  FDA and  has been authorized for detection and/or diagnosis of SARS-CoV-2 by FDA under an Emergency Use Authorization (EUA). This EUA will remain  in effect (meaning this test can be used) for the duration of the COVID-19 declaration under Section 564(b)(1) of the Act, 21 U.S.C.section 360bbb-3(b)(1), unless the authorization is terminated  or revoked sooner.       Influenza A by PCR  POSITIVE (A) NEGATIVE Final   Influenza B by PCR NEGATIVE NEGATIVE Final    Comment: (NOTE) The Xpert Xpress SARS-CoV-2/FLU/RSV plus assay is intended as an aid in the diagnosis of influenza from Nasopharyngeal swab specimens and should not be used as a sole basis for treatment. Nasal washings and aspirates are unacceptable for Xpert Xpress SARS-CoV-2/FLU/RSV testing.  Fact Sheet for Patients: bloggercourse.com  Fact Sheet for Healthcare Providers: seriousbroker.it  This test is not yet approved or cleared by the [place]  FDA and has been authorized for detection and/or diagnosis of SARS-CoV-2 by FDA under an Emergency Use Authorization (EUA). This EUA will remain in effect (meaning this test can be used) for the duration of the COVID-19 declaration under Section 564(b)(1) of the Act, 21 U.S.C. section 360bbb-3(b)(1), unless the authorization is terminated or revoked.     Resp Syncytial Virus by PCR NEGATIVE NEGATIVE Final    Comment: (NOTE) Fact Sheet for Patients: bloggercourse.com  Fact Sheet for Healthcare Providers: seriousbroker.it  This test is not yet approved or cleared by the [place]  FDA and has been authorized for detection and/or diagnosis of SARS-CoV-2 by FDA under an Emergency Use Authorization (EUA). This EUA will remain in effect (meaning this test can be used) for the duration of the COVID-19 declaration under Section 564(b)(1) of the Act, 21 U.S.C. section 360bbb-3(b)(1), unless the authorization is terminated or revoked.  Performed at [facility], [address], [place], [place] [address]   Culture, blood (routine x 2)     Status: None (Preliminary result)   Collection Time: 10/02/24  4:40 PM   Specimen: Right Antecubital; Blood  Result Value Ref Range Status   Specimen Description RIGHT ANTECUBITAL BLOOD  Final   Special Requests   Final     BOTTLES DRAWN AEROBIC AND ANAEROBIC Blood Culture adequate volume Performed at [facility], [address] [place], [place] [address]    Culture PENDING  Incomplete   Report Status PENDING  Incomplete  Culture, blood (routine x 2)     Status: None (Preliminary result)   Collection Time: 10/02/24  5:58 PM   Specimen: BLOOD  Result Value Ref Range Status   Specimen Description BLOOD BLOOD LEFT ARM  Final   Special Requests   Final    BOTTLES DRAWN AEROBIC AND ANAEROBIC Blood Culture adequate volume Performed at [facility], [address] [place], [place] [address]    Culture PENDING  Incomplete   Report Status PENDING  Incomplete         Radiology Studies:  DG Chest Portable 1 View Result Date: 10/02/2024 EXAM: 1 VIEW(S) XRAY OF THE CHEST 10/02/2024 05:02:00 PM COMPARISON: 07/25/2024 CLINICAL HISTORY: sob FINDINGS: LUNGS AND PLEURA: There is some increased central interstitial markings. Small nodular densities in the right upper lobe are unchanged from prior CT. There is a stable small right pleural effusion. The left costophrenic angle has been excluded. No pneumothorax. HEART AND MEDIASTINUM: No acute abnormality of the cardiac and mediastinal silhouettes. BONES AND SOFT TISSUES: No acute osseous abnormality. IMPRESSION: 1. Stable small right pleural effusion. 2. Increased central interstitial markings. 3. Stable small nodular densities in the right upper lobe. Electronically signed by: Greig Pique MD 10/02/2024 06:18 PM EST RP Workstation: [id number]        Scheduled Meds:  dextromethorphan -guaiFENesin   1 tablet Oral BID   enoxaparin  (LOVENOX ) injection  40 mg Subcutaneous QHS   famotidine   10 mg Oral QHS   feeding supplement (GLUCERNA SHAKE)  237 mL Oral TID BM   folic acid   1 mg Oral Daily   insulin  aspart  0-15 Units Subcutaneous TID WC   ipratropium  0.5 mg Nebulization Q6H   levothyroxine   112 mcg Oral Q0600   methylPREDNISolone  (SOLU-MEDROL ) injection  40 mg  Intravenous Q12H   oseltamivir   75 mg Oral BID   pantoprazole  (PROTONIX ) IV  40 mg Intravenous Q12H   rosuvastatin   20 mg Oral Daily   venlafaxine  XR  150 mg Oral Q breakfast   Continuous Infusions:  lactated ringers  150 mL/hr at 10/03/24 0211   piperacillin -tazobactam (ZOSYN )  IV 3.375 g (10/03/24 0601)   vancomycin  1,000 mg (10/03/24 0558)     LOS: 1 day    Time spent: 55 minutes    Kathleen Tamm D Maree, DO [facility]  If 7PM-7AM, please contact night-coverage [email or web address] 10/03/2024, 8:44 AM   "

## 2024-10-04 LAB — CBC
HCT: 30.9 % — ABNORMAL LOW (ref 39.0–52.0)
Hemoglobin: 8.8 g/dL — ABNORMAL LOW (ref 13.0–17.0)
MCH: 23.5 pg — ABNORMAL LOW (ref 26.0–34.0)
MCHC: 28.5 g/dL — ABNORMAL LOW (ref 30.0–36.0)
MCV: 82.4 fL (ref 80.0–100.0)
Platelets: 305 K/uL (ref 150–400)
RBC: 3.75 MIL/uL — ABNORMAL LOW (ref 4.22–5.81)
RDW: 20.9 % — ABNORMAL HIGH (ref 11.5–15.5)
WBC: 9.7 K/uL (ref 4.0–10.5)
nRBC: 0 % (ref 0.0–0.2)

## 2024-10-04 LAB — BASIC METABOLIC PANEL WITH GFR
Anion gap: 4 — ABNORMAL LOW (ref 5–15)
BUN: 14 mg/dL (ref 8–23)
CO2: 32 mmol/L (ref 22–32)
Calcium: 8.8 mg/dL — ABNORMAL LOW (ref 8.9–10.3)
Chloride: 105 mmol/L (ref 98–111)
Creatinine, Ser: 0.92 mg/dL (ref 0.61–1.24)
GFR, Estimated: >60 mL/min
Glucose, Bld: 165 mg/dL — ABNORMAL HIGH (ref 70–99)
Potassium: 4 mmol/L (ref 3.5–5.1)
Sodium: 141 mmol/L (ref 135–145)

## 2024-10-04 LAB — MAGNESIUM: Magnesium: 2.2 mg/dL (ref 1.7–2.4)

## 2024-10-04 MED ORDER — OSELTAMIVIR PHOSPHATE 75 MG PO CAPS: 75.0000 mg | ORAL_CAPSULE | Freq: Two times a day (BID) | ORAL | 0 refills | Status: AC

## 2024-10-04 MED ORDER — DM-GUAIFENESIN ER 30-600 MG PO TB12: 1.0000 | ORAL_TABLET | Freq: Two times a day (BID) | ORAL | 0 refills | Status: AC

## 2024-10-04 MED ORDER — ALBUTEROL SULFATE HFA 108 (90 BASE) MCG/ACT IN AERS: 2.0000 | INHALATION_SPRAY | Freq: Four times a day (QID) | RESPIRATORY_TRACT | 2 refills | Status: AC | PRN

## 2024-10-04 MED ORDER — IPRATROPIUM BROMIDE 0.02 % IN SOLN
0.5000 mg | Freq: Three times a day (TID) | RESPIRATORY_TRACT | Status: DC
  Administered 2024-10-04: 0.5 mg via RESPIRATORY_TRACT
  Filled 2024-10-04: qty 2.5

## 2024-10-04 NOTE — Discharge Summary (Signed)
 Physician Discharge Summary  RALIEGH SCOBIE Goodman:[id number] DOB: [birth year] DOA: 10/02/2024  PCP: [name], FNP  Admit date: 10/02/2024  Discharge date: 10/04/2024  Admitted From:Home  Disposition:  Home  Recommendations for Outpatient Follow-up:  Follow up with PCP in 1-2 weeks Remain on Tamiflu  for 3 more days as prescribed Use antitussives and breathing treatments as needed for symptomatic management Continue other home medications as prior  Home Health: None  Equipment/Devices: None  Discharge Condition:Stable  CODE STATUS: Full  Diet recommendation: Heart Healthy  Brief/Interim Summary: Bradley Goodman is a 68 y.o. male with medical history significant of hyperlipidemia, hypothyroidism, stage IV NSCLC (diagnosed in 03/2021), adrenal insufficiency, depression who presents to the emergency department due to worsening generalized weakness which started last night.  He also complained of occasional cough, but denies nausea, vomiting.  Patient states that he had chemotherapy earlier this week.  Patient was admitted for influenza A infection and is also thought to have sepsis due to unspecified organism and was started on broad-spectrum IV antibiotics.  He was not noted to have any bacterial infection and therefore antibiotics were discontinued and he was left on Tamiflu .  He has now been able to wean off of his oxygen requirements and is ambulating without any significant concerns and is overall stable for discharge.  No other acute events or concerns noted.  Discharge Diagnoses:  Principal Problem:   Influenza A  Principal discharge diagnosis: Generalized weakness and hypoxemia in the setting of influenza A infection with recent chemotherapy.  Discharge Instructions  Discharge Instructions     Increase activity slowly   Complete by: As directed       Allergies as of 10/04/2024   No Known Allergies      Medication List     TAKE these medications     acetaminophen  500 MG tablet Commonly known as: TYLENOL  Take 500 mg by mouth every 6 (six) hours as needed for mild pain (pain score 1-3) or fever.   albuterol  108 (90 Base) MCG/ACT inhaler Commonly known as: VENTOLIN  HFA Inhale 2 puffs into the lungs every 6 (six) hours as needed for wheezing or shortness of breath.   B-D 3CC LUER-LOK SYR 21GX1-1/2 21G X 1-1/2 3 ML Misc Generic drug: SYRINGE-NEEDLE (DISP) 3 ML Use to inject testosterone  every week   chlorproMAZINE  10 MG tablet Commonly known as: THORAZINE  Take 1 tablet (10 mg total) by mouth 3 (three) times daily as needed for hiccoughs.   cholecalciferol  1000 units tablet Commonly known as: VITAMIN D  Take 1,000 Units by mouth every evening.   colchicine  0.6 MG tablet Take 1 tablet (0.6 mg total) by mouth 2 (two) times daily.   dexamethasone  1 MG tablet Commonly known as: DECADRON  Take 1 tablet (1 mg total) by mouth daily with breakfast.   dextromethorphan -guaiFENesin  30-600 MG 12hr tablet Commonly known as: MUCINEX  DM Take 1 tablet by mouth 2 (two) times daily for 7 days.   famotidine  10 MG tablet Commonly known as: PEPCID  Take 10 mg by mouth at bedtime. Chewable   folic acid  1 MG tablet Commonly known as: FOLVITE  Take 1 tablet (1 mg total) by mouth daily.   FreeStyle Libre 3 Plus Sensor Misc Change sensor every 15 days.   Krill Oil 300 MG Caps Take 300 mg by mouth every evening.   levothyroxine  112 MCG tablet Commonly known as: SYNTHROID  Take 1 tablet (112 mcg total) by mouth daily before breakfast.   loratadine  10 MG tablet Commonly known as: CLARITIN   Take 10 mg by mouth every evening.   mirtazapine  15 MG tablet Commonly known as: REMERON  Take 1 tablet (15 mg total) by mouth at bedtime.   ondansetron  4 MG tablet Commonly known as: ZOFRAN  Take 2 tablets (8 mg total) by mouth every 8 (eight) hours as needed for nausea.   One A Day Men 50 Plus Tabs Take 1 tablet by mouth at bedtime.   oseltamivir   75 MG capsule Commonly known as: TAMIFLU  Take 1 capsule (75 mg total) by mouth 2 (two) times daily for 3 days.   pantoprazole  40 MG tablet Commonly known as: PROTONIX  Take 1 tablet (40 mg total) by mouth every evening.   polyethylene glycol powder 17 GM/SCOOP powder Commonly known as: GLYCOLAX /MIRALAX  Take 119 g by mouth daily as needed for mild constipation or moderate constipation.   rosuvastatin  20 MG tablet Commonly known as: CRESTOR  Take 1 tablet (20 mg total) by mouth daily.   testosterone  cypionate 100 MG/ML injection Commonly known as: DEPOTESTOTERONE CYPIONATE Take 50mg  ( 0.39ml) and 100mg  ( 1ml) into muscle alternatively weekly.   venlafaxine  XR 75 MG 24 hr capsule Commonly known as: EFFEXOR -XR Take 2 capsules (150 mg total) by mouth daily with breakfast. What changed: when to take this        Follow-up Information     [name], [name], FNP. Schedule an appointment as soon as possible for a visit in 1 week(s).   Specialty: Family Medicine Contact information: [address] [place] [address] [phone]                Allergies[1]  Consultations: None   Procedures/Studies: DG Chest Portable 1 View Result Date: 10/02/2024 EXAM: 1 VIEW(S) XRAY OF THE CHEST 10/02/2024 05:02:00 PM COMPARISON: 07/25/2024 CLINICAL HISTORY: sob FINDINGS: LUNGS AND PLEURA: There is some increased central interstitial markings. Small nodular densities in the right upper lobe are unchanged from prior CT. There is a stable small right pleural effusion. The left costophrenic angle has been excluded. No pneumothorax. HEART AND MEDIASTINUM: No acute abnormality of the cardiac and mediastinal silhouettes. BONES AND SOFT TISSUES: No acute osseous abnormality. IMPRESSION: 1. Stable small right pleural effusion. 2. Increased central interstitial markings. 3. Stable small nodular densities in the right upper lobe. Electronically signed by: Greig Pique MD 10/02/2024 06:18 PM EST RP  Workstation: [id number]   CT CHEST ABDOMEN PELVIS W CONTRAST Result Date: 09/29/2024 CLINICAL DATA:  Follow-up metastatic lung carcinoma. Currently undergoing immunotherapy. Previous chemotherapy and radiation therapy. * Tracking Code: BO * EXAM: CT CHEST, ABDOMEN, AND PELVIS WITH CONTRAST TECHNIQUE: Multidetector CT imaging of the chest, abdomen and pelvis was performed following the standard protocol during bolus administration of intravenous contrast. RADIATION DOSE REDUCTION: This exam was performed according to the departmental dose-optimization program which includes automated exposure control, adjustment of the mA and/or kV according to patient size and/or use of iterative reconstruction technique. CONTRAST:  OMNIPAQUE  IOHEXOL  300 MG/ML  SOLN COMPARISON:  06/16/2024 and 06/13/2024 FINDINGS: CT CHEST FINDINGS Cardiovascular: Increased size of moderate pericardial effusion. Aortic and coronary atherosclerotic calcification noted. Mediastinum/Lymph Nodes: No masses or pathologically enlarged lymph nodes identified. Lungs/Pleura: Increased size of small bilateral pleural effusions. 17 mm spiculated nodule irregular nodule in the right upper lobe shows no significant change. Two sub-cm nodules in the peripheral right upper lobe are also stable. Sub-cm ground-glass nodule in the posterior left upper lobe on image 72/4 is also unchanged. No new or enlarging pulmonary nodules or masses identified. Musculoskeletal:  No suspicious  bone lesions identified. CT ABDOMEN AND PELVIS FINDINGS Hepatobiliary: 7 mm nonspecific low-attenuation lesion in the inferior right hepatic lobe remains stable. No other liver lesions identified. No evidence of cholecystitis or biliary ductal dilatation. Pancreas:  No mass or inflammatory changes. Spleen:  Within normal limits in size and appearance. Adrenals/Urinary tract: Normal adrenal glands. No renal masses. 7 mm nonobstructing calculus again seen in upper pole of left kidney.  No evidence of ureteral calculi or hydronephrosis. Stomach/Bowel: No evidence of obstruction, inflammatory process, or abnormal fluid collections. Vascular/Lymphatic: No pathologically enlarged lymph nodes identified. No acute vascular findings. Reproductive:  Stable mildly enlarged prostate. Other:  None. Musculoskeletal:  No suspicious bone lesions identified. IMPRESSION: Stable dominant right upper lobe nodule and other sub-cm bilateral pulmonary nodules. No new or progressive metastatic disease identified. Increased size of moderate pericardial effusion and small bilateral pleural effusions. Electronically Signed   By: Norleen DELENA Kil M.D.   On: 09/29/2024 09:55   MR BRAIN W WO CONTRAST Result Date: 09/11/2024 EXAM: MRI BRAIN WITH AND WITHOUT CONTRAST 09/11/2024 01:42:05 PM TECHNIQUE: Multiplanar multisequence MRI of the head/brain was performed with and without the administration of intravenous contrast. COMPARISON: None available. CLINICAL HISTORY: Brain/CNS neoplasm, assess treatment response. FINDINGS: BRAIN AND VENTRICLES: No acute infarct. No acute intracranial hemorrhage. No mass effect or midline shift. No hydrocephalus. The sella is unremarkable. Normal flow voids. There is a new small nodular focus of enhancement within the right parietal lobe superficial to the previously noted metastatic lesion. The original lesion also appears to have marginally increased in size in the interim from approximately 3.4 mm to 4.2 mm. Both lesions are demonstrated on image 95 of series 15. An enhancing nodular lesion is also again demonstrated within the left cerebellar hemisphere and appears unchanged. There is extensive cerebral white matter disease again demonstrated. ORBITS: No acute abnormality. SINUSES: No acute abnormality. BONES AND SOFT TISSUES: Normal bone marrow signal and enhancement. There is a moderate right mastoid air cell effusion. IMPRESSION: 1. New small nodular focus of enhancement within the right  parietal lobe superficial to the previously noted metastatic lesion, which has marginally increased in size from approximately 3.4 mm to 4.2 mm. 2. Stable enhancing nodular lesion within the left cerebellar hemisphere. 3. Extensive cerebral white matter disease. 4. Moderate right mastoid air cell effusion. Electronically signed by: Evalene Coho MD 09/11/2024 02:08 PM EST RP Workstation: [id number]     Discharge Exam: Vitals:   10/04/24 0829 10/04/24 1152  BP:  (!) 151/99  Pulse:  97  Resp:  20  Temp:    SpO2: 98%    Vitals:   10/03/24 2349 10/04/24 0429 10/04/24 0829 10/04/24 1152  BP: (!) 146/94 (!) 163/104  (!) 151/99  Pulse:  95  97  Resp:  18  20  Temp: 98.5 F (36.9 C) (!) 97.5 F (36.4 C)    TempSrc: Oral Oral    SpO2: 97% 99% 98%   Weight:      Height:        General: Pt is alert, awake, not in acute distress Cardiovascular: RRR, S1/S2 +, no rubs, no gallops Respiratory: CTA bilaterally, no wheezing, no rhonchi Abdominal: Soft, NT, ND, bowel sounds + Extremities: no edema, no cyanosis    The results of significant diagnostics from this hospitalization (including imaging, microbiology, ancillary and laboratory) are listed below for reference.     Microbiology: Recent Results (from the past 240 hours)  Resp panel by RT-PCR (RSV, Flu A&B, Covid) Anterior Nasal Swab  Status: Abnormal   Collection Time: 10/02/24  4:27 PM   Specimen: Anterior Nasal Swab  Result Value Ref Range Status   SARS Coronavirus 2 by RT PCR NEGATIVE NEGATIVE Final    Comment: (NOTE) SARS-CoV-2 target nucleic acids are NOT DETECTED.  The SARS-CoV-2 RNA is generally detectable in upper respiratory specimens during the acute phase of infection. The lowest concentration of SARS-CoV-2 viral copies this assay can detect is 138 copies/mL. A negative result does not preclude SARS-Cov-2 infection and should not be used as the sole basis for treatment or other patient management decisions. A  negative result may occur with  improper specimen collection/handling, submission of specimen other than nasopharyngeal swab, presence of viral mutation(s) within the areas targeted by this assay, and inadequate number of viral copies(<138 copies/mL). A negative result must be combined with clinical observations, patient history, and epidemiological information. The expected result is Negative.  Fact Sheet for Patients:  bloggercourse.com  Fact Sheet for Healthcare Providers:  seriousbroker.it  This test is no t yet approved or cleared by the [place]  FDA and  has been authorized for detection and/or diagnosis of SARS-CoV-2 by FDA under an Emergency Use Authorization (EUA). This EUA will remain  in effect (meaning this test can be used) for the duration of the COVID-19 declaration under Section 564(b)(1) of the Act, 21 U.S.C.section 360bbb-3(b)(1), unless the authorization is terminated  or revoked sooner.       Influenza A by PCR POSITIVE (A) NEGATIVE Final   Influenza B by PCR NEGATIVE NEGATIVE Final    Comment: (NOTE) The Xpert Xpress SARS-CoV-2/FLU/RSV plus assay is intended as an aid in the diagnosis of influenza from Nasopharyngeal swab specimens and should not be used as a sole basis for treatment. Nasal washings and aspirates are unacceptable for Xpert Xpress SARS-CoV-2/FLU/RSV testing.  Fact Sheet for Patients: bloggercourse.com  Fact Sheet for Healthcare Providers: seriousbroker.it  This test is not yet approved or cleared by the [place]  FDA and has been authorized for detection and/or diagnosis of SARS-CoV-2 by FDA under an Emergency Use Authorization (EUA). This EUA will remain in effect (meaning this test can be used) for the duration of the COVID-19 declaration under Section 564(b)(1) of the Act, 21 U.S.C. section 360bbb-3(b)(1), unless the  authorization is terminated or revoked.     Resp Syncytial Virus by PCR NEGATIVE NEGATIVE Final    Comment: (NOTE) Fact Sheet for Patients: bloggercourse.com  Fact Sheet for Healthcare Providers: seriousbroker.it  This test is not yet approved or cleared by the [place]  FDA and has been authorized for detection and/or diagnosis of SARS-CoV-2 by FDA under an Emergency Use Authorization (EUA). This EUA will remain in effect (meaning this test can be used) for the duration of the COVID-19 declaration under Section 564(b)(1) of the Act, 21 U.S.C. section 360bbb-3(b)(1), unless the authorization is terminated or revoked.  Performed at [facility], [address], [place], [place] [address]   Culture, blood (routine x 2)     Status: None (Preliminary result)   Collection Time: 10/02/24  4:40 PM   Specimen: Right Antecubital; Blood  Result Value Ref Range Status   Specimen Description RIGHT ANTECUBITAL BLOOD  Final   Special Requests   Final    BOTTLES DRAWN AEROBIC AND ANAEROBIC Blood Culture adequate volume   Culture   Final    NO GROWTH 2 DAYS Performed at [facility], [address], [place], [place] [address]    Report Status PENDING  Incomplete  Culture,  blood (routine x 2)     Status: None (Preliminary result)   Collection Time: 10/02/24  5:58 PM   Specimen: BLOOD  Result Value Ref Range Status   Specimen Description BLOOD BLOOD LEFT ARM  Final   Special Requests   Final    BOTTLES DRAWN AEROBIC AND ANAEROBIC Blood Culture adequate volume   Culture   Final    NO GROWTH 2 DAYS Performed at [facility], [address] [place], [place] [address]    Report Status PENDING  Incomplete     Labs: BNP (last 3 results) No results for input(s): BNP in the last 8760 hours. Basic Metabolic Panel: Recent Labs  Lab 09/29/24 1051 10/02/24 1551 10/03/24 0641 10/04/24 0442  NA 144 139 138 141  K 4.1 4.5 3.7 4.0  CL 105  100 102 105  CO2 27 29 29  32  GLUCOSE 112* 115* 129* 165*  BUN 13 13 12 14   CREATININE 1.14 1.27* 0.97 0.92  CALCIUM  9.7 9.0 8.1* 8.8*  MG  --   --  1.8 2.2  PHOS  --   --  3.2  --    Liver Function Tests: Recent Labs  Lab 09/29/24 1051 10/02/24 1551 10/03/24 0641  AST 27 51* 32  ALT 22 32 24  ALKPHOS 79 76 57  BILITOT 0.3 0.5 0.3  PROT 7.6 7.1 5.7*  ALBUMIN 4.4 3.9 3.2*   No results for input(s): LIPASE, AMYLASE in the last 168 hours. No results for input(s): AMMONIA in the last 168 hours. CBC: Recent Labs  Lab 09/29/24 1051 10/02/24 1551 10/03/24 0641 10/04/24 0442  WBC 10.9* 11.7* 9.8 9.7  NEUTROABS 9.1* 11.0*  --   --   HGB 10.9* 10.6* 8.2* 8.8*  HCT 38.3* 38.4* 28.8* 30.9*  MCV 81.3 83.7 82.3 82.4  PLT 488* 422* 307 305   Cardiac Enzymes: No results for input(s): CKTOTAL, CKMB, CKMBINDEX, TROPONINI in the last 168 hours. BNP: Invalid input(s): POCBNP CBG: No results for input(s): GLUCAP in the last 168 hours. D-Dimer No results for input(s): DDIMER in the last 72 hours. Hgb A1c Recent Labs    10/03/24 0641  HGBA1C 6.5*   Lipid Profile No results for input(s): CHOL, HDL, LDLCALC, TRIG, CHOLHDL, LDLDIRECT in the last 72 hours. Thyroid  function studies No results for input(s): TSH, T4TOTAL, T3FREE, THYROIDAB in the last 72 hours.  Invalid input(s): FREET3 Anemia work up No results for input(s): VITAMINB12, FOLATE, FERRITIN, TIBC, IRON, RETICCTPCT in the last 72 hours. Urinalysis    Component Value Date/Time   COLORURINE YELLOW 10/02/2024 1827   APPEARANCEUR CLEAR 10/02/2024 1827   APPEARANCEUR Clear 12/11/2017 1117   LABSPEC 1.016 10/02/2024 1827   PHURINE 6.0 10/02/2024 1827   GLUCOSEU NEGATIVE 10/02/2024 1827   HGBUR SMALL (A) 10/02/2024 1827   BILIRUBINUR NEGATIVE 10/02/2024 1827   BILIRUBINUR Negative 12/11/2017 1117   KETONESUR NEGATIVE 10/02/2024 1827   PROTEINUR 30 (A) 10/02/2024  1827   UROBILINOGEN negative 07/13/2014 1059   NITRITE NEGATIVE 10/02/2024 1827   LEUKOCYTESUR NEGATIVE 10/02/2024 1827   Sepsis Labs Recent Labs  Lab 09/29/24 1051 10/02/24 1551 10/03/24 0641 10/04/24 0442  WBC 10.9* 11.7* 9.8 9.7   Microbiology Recent Results (from the past 240 hours)  Resp panel by RT-PCR (RSV, Flu A&B, Covid) Anterior Nasal Swab     Status: Abnormal   Collection Time: 10/02/24  4:27 PM   Specimen: Anterior Nasal Swab  Result Value Ref Range Status   SARS Coronavirus 2 by RT  PCR NEGATIVE NEGATIVE Final    Comment: (NOTE) SARS-CoV-2 target nucleic acids are NOT DETECTED.  The SARS-CoV-2 RNA is generally detectable in upper respiratory specimens during the acute phase of infection. The lowest concentration of SARS-CoV-2 viral copies this assay can detect is 138 copies/mL. A negative result does not preclude SARS-Cov-2 infection and should not be used as the sole basis for treatment or other patient management decisions. A negative result may occur with  improper specimen collection/handling, submission of specimen other than nasopharyngeal swab, presence of viral mutation(s) within the areas targeted by this assay, and inadequate number of viral copies(<138 copies/mL). A negative result must be combined with clinical observations, patient history, and epidemiological information. The expected result is Negative.  Fact Sheet for Patients:  bloggercourse.com  Fact Sheet for Healthcare Providers:  seriousbroker.it  This test is no t yet approved or cleared by the [place]  FDA and  has been authorized for detection and/or diagnosis of SARS-CoV-2 by FDA under an Emergency Use Authorization (EUA). This EUA will remain  in effect (meaning this test can be used) for the duration of the COVID-19 declaration under Section 564(b)(1) of the Act, 21 U.S.C.section 360bbb-3(b)(1), unless the authorization is  terminated  or revoked sooner.       Influenza A by PCR POSITIVE (A) NEGATIVE Final   Influenza B by PCR NEGATIVE NEGATIVE Final    Comment: (NOTE) The Xpert Xpress SARS-CoV-2/FLU/RSV plus assay is intended as an aid in the diagnosis of influenza from Nasopharyngeal swab specimens and should not be used as a sole basis for treatment. Nasal washings and aspirates are unacceptable for Xpert Xpress SARS-CoV-2/FLU/RSV testing.  Fact Sheet for Patients: bloggercourse.com  Fact Sheet for Healthcare Providers: seriousbroker.it  This test is not yet approved or cleared by the [place]  FDA and has been authorized for detection and/or diagnosis of SARS-CoV-2 by FDA under an Emergency Use Authorization (EUA). This EUA will remain in effect (meaning this test can be used) for the duration of the COVID-19 declaration under Section 564(b)(1) of the Act, 21 U.S.C. section 360bbb-3(b)(1), unless the authorization is terminated or revoked.     Resp Syncytial Virus by PCR NEGATIVE NEGATIVE Final    Comment: (NOTE) Fact Sheet for Patients: bloggercourse.com  Fact Sheet for Healthcare Providers: seriousbroker.it  This test is not yet approved or cleared by the [place]  FDA and has been authorized for detection and/or diagnosis of SARS-CoV-2 by FDA under an Emergency Use Authorization (EUA). This EUA will remain in effect (meaning this test can be used) for the duration of the COVID-19 declaration under Section 564(b)(1) of the Act, 21 U.S.C. section 360bbb-3(b)(1), unless the authorization is terminated or revoked.  Performed at [facility], [address] [place], [place] [address]   Culture, blood (routine x 2)     Status: None (Preliminary result)   Collection Time: 10/02/24  4:40 PM   Specimen: Right Antecubital; Blood  Result Value Ref Range Status   Specimen Description  RIGHT ANTECUBITAL BLOOD  Final   Special Requests   Final    BOTTLES DRAWN AEROBIC AND ANAEROBIC Blood Culture adequate volume   Culture   Final    NO GROWTH 2 DAYS Performed at [facility], [address] [place], [place] [address]    Report Status PENDING  Incomplete  Culture, blood (routine x 2)     Status: None (Preliminary result)   Collection Time: 10/02/24  5:58 PM   Specimen: BLOOD  Result Value Ref Range  Status   Specimen Description BLOOD BLOOD LEFT ARM  Final   Special Requests   Final    BOTTLES DRAWN AEROBIC AND ANAEROBIC Blood Culture adequate volume   Culture   Final    NO GROWTH 2 DAYS Performed at [facility], [address], [place], [place] [address]    Report Status PENDING  Incomplete     Time coordinating discharge: 35 minutes  SIGNED:   Adron JONETTA Fairly, DO [facility] 10/04/2024, 11:52 AM  If 7PM-7AM, please contact night-coverage [email or web address]     [1] No Known Allergies

## 2024-10-04 NOTE — Progress Notes (Shared)
 Pt BP elevated, but pt remains asymptomatic.  Will continue to monitor.

## 2024-10-04 NOTE — Plan of Care (Signed)

## 2024-10-07 LAB — CULTURE, BLOOD (ROUTINE X 2)
Culture: NO GROWTH
Culture: NO GROWTH
Special Requests: ADEQUATE
Special Requests: ADEQUATE

## 2024-10-08 ENCOUNTER — Encounter: Payer: Self-pay | Admitting: Internal Medicine

## 2024-10-12 ENCOUNTER — Other Ambulatory Visit (HOSPITAL_COMMUNITY): Payer: Self-pay

## 2024-10-12 DIAGNOSIS — K219 Gastro-esophageal reflux disease without esophagitis: Secondary | ICD-10-CM

## 2024-10-12 DIAGNOSIS — E1169 Type 2 diabetes mellitus with other specified complication: Secondary | ICD-10-CM

## 2024-10-12 MED ORDER — PANTOPRAZOLE SODIUM 40 MG PO TBEC
40.0000 mg | DELAYED_RELEASE_TABLET | Freq: Every evening | ORAL | 0 refills | Status: AC
  Filled 2024-10-12: qty 90, 90d supply, fill #0

## 2024-10-12 MED ORDER — ROSUVASTATIN CALCIUM 20 MG PO TABS
20.0000 mg | ORAL_TABLET | Freq: Every day | ORAL | 0 refills | Status: AC
  Filled 2024-10-12: qty 90, 90d supply, fill #0

## 2024-10-13 ENCOUNTER — Other Ambulatory Visit: Payer: Self-pay

## 2024-10-13 NOTE — Progress Notes (Signed)
 " Cardiology Office Note:  .   Date:  10/15/2024  ID:  Bradley Goodman, DOB [birth year], MRN [id number] PCP: [name], FNP  [facility] Providers Cardiologist:  Diannah SHAUNNA Maywood, MD Cardiology APP:  Sheron Lorette GRADE, PA-C {  History of Present Illness: .   Bradley Goodman is a 69 y.o. male  with PMHx of pericardial effusion, stage IV NSCLC (dx 03/2021), adrenal insufficiency, hypothyroidism, depression, HTN, HLD who reports to [facility] office for follow up.   Pertinent cardiac medical history:  Stress echo in 2018 was normal without any signs of ischemia.  Pericardial Effusion  Diagnosed during admission 06/2024 in setting of Sepsis.  CTA with small pericardial effusion that increased in size since 11/11/2023.   ECHO 06/14/2024 showed EF 55 to 60%, moderate pericardial effusion that is circumferential, trivial MV regurgitation, trivial AV regurgitation, mild AV calcification.  Limited Echo 06/16/2024 showed EF 60-65%, mod pericardial effusion, no tamponade; effusion is smaller.  06/2024 Elevated Crp 7 and ESR 74. Suspected most likely secondary to malignancy and radiation.  07/2024 Elevated Crp 1.7 and ESR 24.  08/07/2024 Limited ECHO showed EF 60 to 65%, mild LVH, moderate circumferential pericardial effusion with no tamponade. Started colchicine  and ibuprofen  on 08/11/2024  Last seen in heartcare 07/16/2024 by myself for follow up.  Reported intermittent occasional DOE with more moderate exertion such as changing tires and walking up use.  Otherwise doing well from cardiology perspective.  Ordered follow-up ESR/CRP and limited echo as above.  Today, accompanied by his wife. He reports he went to the hospital shortly after Christmas and was diagnosed with influenza, with symptoms now improved and back to baseline. Overall doing well except for occasional lower-extremity edema that improves with leg elevation. He notes elevated home systolic blood pressures in the 150s; however, his  wife reports these readings were obtained manually using very old equipment.  He denies chest pain, palpitations, syncope, presyncope, dizziness, orthopnea, PND, significant weight changes, acute bleeding, or claudication. Reports medication compliance, including colchicine . He remains fairly active and is able to golf 18 holes and perform yard work without chest pain, though he notes intermittent shortness of breath with more moderate exertion such as changing a car tire or walking hills. He has reduced smoking to 6-7 cigarettes per day, drinks alcohol socially, and denies illicit drug use.  ROS: 10 point review of system has been reviewed and considered negative except ones been listed in the HPI.   Studies Reviewed: .    Limited ECHO IMPRESSIONS 08/07/2024  1. Limited Echo to evaluate for pericardial effusion.   2. Left ventricular ejection fraction, by estimation, is 60 to 65%. The  left ventricle has normal function. The left ventricle has no regional  wall motion abnormalities. There is mild left ventricular hypertrophy.  Left ventricular diastolic function  could not be evaluated.   3. Right ventricular systolic function is normal. The right ventricular  size is normal. Tricuspid regurgitation signal is inadequate for assessing  PA pressure.   4. Moderate circumferential pericardial effusion. No evidence of  respiratory variation in mitral or tricuspid inflow to suggest tamponade.  There is mild, undulation of RA and RV in the early diastole (which is  seen on echo from 06/14/24 but not on echo  from 06/16/2024). CVP is 3 mm Hg which is reassuring. Consider follow-up  imaging to reassess percicardial effusion. Correlate clinically.   5. The inferior vena cava is normal in size with greater than 50%  respiratory  variability, suggesting right atrial pressure of 3 mmHg.   Comparison(s): Changes from prior study are noted. Changes as above.   CV Studies: Cardiac studies reviewed are  outlined and summarized above. Otherwise please see EMR for full report.   Physical Exam:   VS:  BP 122/84 (BP Location: Right Arm, Cuff Size: Normal)   Pulse (!) 102   Ht 5' 11.5 (1.816 m)   Wt 184 lb (83.5 kg)   SpO2 97%   BMI 25.31 kg/m    Wt Readings from Last 3 Encounters:  10/15/24 184 lb (83.5 kg)  10/02/24 193 lb 15.7 oz (88 kg)  09/29/24 194 lb (88 kg)    GEN: Well nourished, well developed in no acute distress while sitting in chair. Accompanied by wife.  NECK: No JVD; No carotid bruits CARDIAC: RRR, no murmurs, rubs, gallops RESPIRATORY:  Clear to auscultation without rales, wheezing or rhonchi  ABDOMEN: Soft, non-tender, non-distended EXTREMITIES:  No edema; No deformity   ASSESSMENT AND PLAN: .   Pericardial Effusion  Complex history as above.  Continue with colchicine  for 3 months with expected discontinuing of 11/11/2024.  Order ESR/CRP and limited ECHO for pericardial effusion for Mid 11/2024 No signs of cardiac tamponade.  Discussed red flag symptoms for ED precautions including severe shortness of breath, syncope, hypotension. Suspect most likely secondary to malignancy and radiation.   Hypokalemia 06/2024: K 3>3.8 & MG 1.9.  Resolved.      Latest Ref Rng & Units 10/04/2024    4:42 AM 10/03/2024    6:41 AM 10/02/2024    3:51 PM  BMP  Glucose 70 - 99 mg/dL 834  870  884   BUN 8 - 23 mg/dL 14  12  13    Creatinine 0.61 - 1.24 mg/dL 9.07  9.02  8.72   Sodium 135 - 145 mmol/L 141  138  139   Potassium 3.5 - 5.1 mmol/L 4.0  3.7  4.5   Chloride 98 - 111 mmol/L 105  102  100   CO2 22 - 32 mmol/L 32  29  29   Calcium  8.9 - 10.3 mg/dL 8.8  8.1  9.0    HTN  BP this OV well controlled: 122/84, however notes some elevated home SBP of 150's with spouse taking manual BP with old equipment. Encouraged to consider automatic cuff and/or compare BP reading to BP machine in pharmacy or [facility]. Discussed proper BP measurement technique.  Continue to monitor BP.   Encourage physical activity for 150 minutes per week and heart healthy low sodium diet. Discussed limiting sodium intake to < 2 grams daily.      HLD, LDL goal < 100 LDL 48 in 04/2024 and LFT WNL in 07/2024 Continue Crestor  20 mg     Dispo: Follow up  in 3 months with Scottie.   Signed, Lorette CINDERELLA Kapur, PA-C  "

## 2024-10-15 ENCOUNTER — Encounter: Payer: Self-pay | Admitting: Physician Assistant

## 2024-10-15 ENCOUNTER — Other Ambulatory Visit: Payer: Self-pay | Admitting: "Endocrinology

## 2024-10-15 ENCOUNTER — Ambulatory Visit: Attending: Physician Assistant | Admitting: Physician Assistant

## 2024-10-15 ENCOUNTER — Encounter: Payer: Self-pay | Admitting: Internal Medicine

## 2024-10-15 ENCOUNTER — Other Ambulatory Visit: Payer: Self-pay | Admitting: Family Medicine

## 2024-10-15 ENCOUNTER — Other Ambulatory Visit: Payer: Self-pay

## 2024-10-15 VITALS — BP 122/84 | HR 102 | Ht 71.5 in | Wt 184.0 lb

## 2024-10-15 DIAGNOSIS — I152 Hypertension secondary to endocrine disorders: Secondary | ICD-10-CM | POA: Diagnosis not present

## 2024-10-15 DIAGNOSIS — C7931 Secondary malignant neoplasm of brain: Secondary | ICD-10-CM

## 2024-10-15 DIAGNOSIS — E1159 Type 2 diabetes mellitus with other circulatory complications: Secondary | ICD-10-CM

## 2024-10-15 DIAGNOSIS — F3340 Major depressive disorder, recurrent, in remission, unspecified: Secondary | ICD-10-CM

## 2024-10-15 DIAGNOSIS — R630 Anorexia: Secondary | ICD-10-CM

## 2024-10-15 DIAGNOSIS — I3139 Other pericardial effusion (noninflammatory): Secondary | ICD-10-CM | POA: Diagnosis not present

## 2024-10-15 DIAGNOSIS — E785 Hyperlipidemia, unspecified: Secondary | ICD-10-CM

## 2024-10-15 DIAGNOSIS — E2740 Unspecified adrenocortical insufficiency: Secondary | ICD-10-CM

## 2024-10-15 DIAGNOSIS — G479 Sleep disorder, unspecified: Secondary | ICD-10-CM

## 2024-10-15 DIAGNOSIS — F339 Major depressive disorder, recurrent, unspecified: Secondary | ICD-10-CM

## 2024-10-15 MED ORDER — FOLIC ACID 1 MG PO TABS
1.0000 mg | ORAL_TABLET | Freq: Every day | ORAL | 0 refills | Status: AC
  Filled 2024-10-15: qty 90, 90d supply, fill #0

## 2024-10-15 MED ORDER — DEXAMETHASONE 1 MG PO TABS
ORAL_TABLET | ORAL | 1 refills | Status: AC
  Filled 2024-10-15 – 2024-10-19 (×2): qty 90, 90d supply, fill #0

## 2024-10-15 MED ORDER — MIRTAZAPINE 15 MG PO TABS
15.0000 mg | ORAL_TABLET | Freq: Every day | ORAL | 0 refills | Status: AC
  Filled 2024-10-15: qty 90, 90d supply, fill #0

## 2024-10-15 NOTE — Patient Instructions (Signed)
 Medication Instructions:   Your physician recommends that you continue on your current medications as directed. Please refer to the Current Medication list given to you today.   Labwork:  ESR,CRP mid-February   Testing/Procedures: LIMITED Echo mid February at [facility]   Follow-Up: 3 months Scottie Sheron, PA-C  Any Other Special Instructions Will Be Listed Below (If Applicable).  If you need a refill on your cardiac medications before your next appointment, please call your pharmacy.

## 2024-10-16 ENCOUNTER — Other Ambulatory Visit: Payer: Self-pay

## 2024-10-16 ENCOUNTER — Ambulatory Visit: Admitting: Physician Assistant

## 2024-10-16 ENCOUNTER — Other Ambulatory Visit (HOSPITAL_COMMUNITY): Payer: Self-pay

## 2024-10-18 NOTE — Progress Notes (Unsigned)
 "     [facility]   [phone]) [phone] [phone]    Patient Care Team: [name], FNP as PCP - General (Family Medicine) Mallipeddi, Diannah SQUIBB, MD as PCP - Cardiology (Cardiology) Shaaron Lamar HERO, MD as Consulting Physician (Gastroenterology) Vicci Mcardle, OD (Optometry) Sheron Lorette CINDERELLA DEVONNA as Physician Assistant (Cardiology)   CHIEF COMPLAINT: Follow up lung cancer   CURRENT THERAPY: Maintenance Alimta /Keytruda   INTERVAL HISTORY Bradley Goodman returns for follow up and treatment as scheduled, last seen by Dr. Sherrod 09/29/24, hospitalized a few days later with the flu. He saw cardiology 10/15/24 with plans to continue med management and observation.   Today he presents with his wife, has recovered well from the flu with only mild residual fatigue and slight cough.  Tolerating chemo, mild nausea and hiccups are controlled.  Bowels moving, denies fever/chills, dyspnea, new headaches, or any other specific complaints.  ROS  All other systems reviewed and negative  Past Medical History:  Diagnosis Date   Brain cancer (HCC)    Cervical spondylolysis    Depression    Essential hypertension    Family history of breast cancer    GERD (gastroesophageal reflux disease)    History of kidney stones    History of migraine    Hyperlipidemia    Hypertension    lung ca with mets to brain    Lung cancer (HCC)    PONV (postoperative nausea and vomiting)    Type 2 diabetes mellitus (HCC)      Past Surgical History:  Procedure Laterality Date   Bilateral inguinal hernia repair     BRONCHIAL NEEDLE ASPIRATION BIOPSY  04/05/2021   Procedure: BRONCHIAL NEEDLE ASPIRATION BIOPSIES;  Surgeon: Brenna Adine CROME, DO;  Location: [facility];  Service: Pulmonary;;   COLONOSCOPY  01/23/2012   Procedure: COLONOSCOPY;  Surgeon: Lamar HERO Shaaron, MD;  Location: [facility];  Service: Endoscopy;  Laterality: N/A;  9:30 AM   COLONOSCOPY N/A 10/11/2015   Procedure:  COLONOSCOPY;  Surgeon: Lamar HERO Shaaron, MD;  Location: [facility];  Service: Endoscopy;  Laterality: N/A;  830   COLONOSCOPY N/A 10/14/2019   Procedure: COLONOSCOPY;  Surgeon: Shaaron Lamar HERO, MD;  Location: [facility];  Service: Endoscopy;  Laterality: N/A;  1:45   HERNIA REPAIR     POLYPECTOMY  10/14/2019   Procedure: POLYPECTOMY;  Surgeon: Shaaron Lamar HERO, MD;  Location: [facility];  Service: Endoscopy;;  ascending colon, descending colon   VIDEO BRONCHOSCOPY WITH ENDOBRONCHIAL ULTRASOUND N/A 04/05/2021   Procedure: VIDEO BRONCHOSCOPY WITH ENDOBRONCHIAL ULTRASOUND;  Surgeon: Brenna Adine CROME, DO;  Location: [facility] ENDOSCOPY;  Service: Pulmonary;  Laterality: N/A;     Outpatient Encounter Medications as of 10/20/2024  Medication Sig Note   acetaminophen  (TYLENOL ) 500 MG tablet Take 500 mg by mouth every 6 (six) hours as needed for mild pain (pain score 1-3) or fever.    albuterol  (VENTOLIN  HFA) 108 (90 Base) MCG/ACT inhaler Inhale 2 puffs into the lungs every 6 (six) hours as needed for wheezing or shortness of breath.    chlorproMAZINE  (THORAZINE ) 10 MG tablet Take 1 tablet (10 mg total) by mouth 3 (three) times daily as needed for hiccoughs.    cholecalciferol  (VITAMIN D ) 1000 UNITS tablet Take 1,000 Units by mouth every evening.    colchicine  0.6 MG tablet Take 1 tablet (0.6 mg total) by mouth 2 (two) times daily.    Continuous Glucose Sensor (FREESTYLE LIBRE 3 PLUS SENSOR) MISC  Change sensor every 15 days.    dexamethasone  (DECADRON ) 1 MG tablet Take 1 tablet (1 mg total) by mouth daily with breakfast.    famotidine  (PEPCID ) 10 MG tablet Take 10 mg by mouth at bedtime. Chewable    folic acid  (FOLVITE ) 1 MG tablet Take 1 tablet (1 mg total) by mouth daily.    Krill Oil 300 MG CAPS Take 300 mg by mouth every evening.    levothyroxine  (SYNTHROID ) 112 MCG tablet Take 1 tablet (112 mcg total) by mouth daily before breakfast.    loratadine  (CLARITIN ) 10 MG tablet Take 10 mg by mouth every  evening.     mirtazapine  (REMERON ) 15 MG tablet Take 1 tablet (15 mg total) by mouth at bedtime.    Multiple Vitamins-Minerals (ONE A DAY MEN 50 PLUS) TABS Take 1 tablet by mouth at bedtime.    ondansetron  (ZOFRAN ) 4 MG tablet Take 2 tablets (8 mg total) by mouth every 8 (eight) hours as needed for nausea.    pantoprazole  (PROTONIX ) 40 MG tablet Take 1 tablet (40 mg total) by mouth every evening.    polyethylene glycol powder (GLYCOLAX /MIRALAX ) 17 GM/SCOOP powder Take 119 g by mouth daily as needed for mild constipation or moderate constipation. 06/13/2024: Pt's wife stated he has been taking this every other day as of late   rosuvastatin  (CRESTOR ) 20 MG tablet Take 1 tablet (20 mg total) by mouth daily.    SYRINGE-NEEDLE, DISP, 3 ML (B-D 3CC LUER-LOK SYR 21GX1-1/2) 21G X 1-1/2 3 ML MISC Use to inject testosterone  every week    testosterone  cypionate (DEPOTESTOTERONE CYPIONATE) 100 MG/ML injection Take 50mg  ( 0.29ml) and 100mg  ( 1ml) into muscle alternatively weekly.    venlafaxine  XR (EFFEXOR -XR) 75 MG 24 hr capsule Take 2 capsules (150 mg total) by mouth daily with breakfast. (Patient taking differently: Take 150 mg by mouth at bedtime.)    No facility-administered encounter medications on file as of 10/20/2024.     Today's Vitals   10/20/24 1132  BP: 138/88  Pulse: (!) 106  Resp: 17  Temp: 97.6 F (36.4 C)  SpO2: 99%  Weight: 186 lb 14.4 oz (84.8 kg)   Body mass index is 25.7 kg/m.   ECOG PERFORMANCE STATUS: 1 - Symptomatic but completely ambulatory  PHYSICAL EXAM GENERAL:alert, no distress and comfortable SKIN: no rash  HEENT no thrush or ulcers, sclera clear LUNGS: clear with normal breathing effort HEART: regular rate & rhythm, mild bilateral lower extremity edema ABDOMEN: abdomen soft, non-tender and normal bowel sounds NEURO: alert & oriented x 3 with fluent speech, no focal motor/sensory deficits   CBC    Latest Ref Rng & Units 10/20/2024   10:58 AM 10/04/2024    4:42  AM 10/03/2024    6:41 AM  CBC  WBC 4.0 - 10.5 K/uL 12.6  9.7  9.8   Hemoglobin 13.0 - 17.0 g/dL 88.7  8.8  8.2   Hematocrit 39.0 - 52.0 % 38.9  30.9  28.8   Platelets 150 - 400 K/uL 285  305  307       CMP     Latest Ref Rng & Units 10/04/2024    4:42 AM 10/03/2024    6:41 AM 10/02/2024    3:51 PM  CMP  Glucose 70 - 99 mg/dL 834  870  884   BUN 8 - 23 mg/dL 14  12  13    Creatinine 0.61 - 1.24 mg/dL 9.07  9.02  8.72   Sodium 135 - 145  mmol/L 141  138  139   Potassium 3.5 - 5.1 mmol/L 4.0  3.7  4.5   Chloride 98 - 111 mmol/L 105  102  100   CO2 22 - 32 mmol/L 32  29  29   Calcium  8.9 - 10.3 mg/dL 8.8  8.1  9.0   Total Protein 6.5 - 8.1 g/dL  5.7  7.1   Total Bilirubin 0.0 - 1.2 mg/dL  0.3  0.5   Alkaline Phos 38 - 126 U/L  57  76   AST 15 - 41 U/L  32  51   ALT 0 - 44 U/L  24  32       ASSESSMENT & PLAN:   Stage IV (T1b, N3, M1C) non-small cell lung cancer, PD-L1 80%, MSI stable, K-ras G 12C amplification  -presented with right upper lobe lung nodule in addition to right hilar, subcarinal and bilateral mediastinal as well as supraclavicular lymphadenopathy in addition to bone and brain metastasis diagnosed in June 2022.  -Path favors adenocarcinoma -S/p SRS to metastatic brain lesions by Dr. Patrcia 05/04/2021 -S/p palliative systemic chemo carbo/Alimta  and Keytruda  starting 05/08/2021, from cycle 5 he began maintenance Alimta /Keytruda  every 3 weeks -Most recent restaging CT CAP 09/22/2024 was stable -Bradley Goodman appears stable, he has recovered from the flu.  Tolerating treatment well with mild nausea and hiccups.  Side effects are adequately managed with supportive care at home.  He is able to recover and function well with adequate performance status -No clinical evidence of disease progression   PLAN: [facility] course and today's labs reviewed -Proceed with Alimta /Pembro today as scheduled, no dose modifications -Follow-up and next cycle in 3 weeks -Restaging brain MRI  and follow-up with Dr. Buckley as scheduled    All questions were answered. The patient knows to call the clinic with any problems, questions or concerns. No barriers to learning were detected. I spent 20 minutes counseling the patient face to face. The total time spent in the appointment was 30 minutes and more than 50% was on counseling, review of test results, and coordination of care.   Niyana Chesbro K Mahir Prabhakar, NP 10/20/2024   "

## 2024-10-19 ENCOUNTER — Other Ambulatory Visit: Payer: Self-pay

## 2024-10-20 ENCOUNTER — Inpatient Hospital Stay: Attending: Physician Assistant

## 2024-10-20 ENCOUNTER — Inpatient Hospital Stay

## 2024-10-20 ENCOUNTER — Encounter: Payer: Self-pay | Admitting: Nurse Practitioner

## 2024-10-20 ENCOUNTER — Inpatient Hospital Stay (HOSPITAL_BASED_OUTPATIENT_CLINIC_OR_DEPARTMENT_OTHER): Admitting: Nurse Practitioner

## 2024-10-20 VITALS — BP 138/88 | HR 106 | Temp 97.6°F | Resp 17 | Wt 186.9 lb

## 2024-10-20 VITALS — BP 156/96 | HR 105 | Temp 98.7°F | Resp 16

## 2024-10-20 DIAGNOSIS — C7931 Secondary malignant neoplasm of brain: Secondary | ICD-10-CM | POA: Diagnosis not present

## 2024-10-20 DIAGNOSIS — C7951 Secondary malignant neoplasm of bone: Secondary | ICD-10-CM | POA: Insufficient documentation

## 2024-10-20 DIAGNOSIS — C3491 Malignant neoplasm of unspecified part of right bronchus or lung: Secondary | ICD-10-CM | POA: Diagnosis not present

## 2024-10-20 DIAGNOSIS — Z7952 Long term (current) use of systemic steroids: Secondary | ICD-10-CM | POA: Diagnosis not present

## 2024-10-20 DIAGNOSIS — C3411 Malignant neoplasm of upper lobe, right bronchus or lung: Secondary | ICD-10-CM | POA: Insufficient documentation

## 2024-10-20 DIAGNOSIS — Z5112 Encounter for antineoplastic immunotherapy: Secondary | ICD-10-CM | POA: Diagnosis present

## 2024-10-20 DIAGNOSIS — R5383 Other fatigue: Secondary | ICD-10-CM | POA: Insufficient documentation

## 2024-10-20 DIAGNOSIS — R066 Hiccough: Secondary | ICD-10-CM | POA: Insufficient documentation

## 2024-10-20 DIAGNOSIS — F17210 Nicotine dependence, cigarettes, uncomplicated: Secondary | ICD-10-CM | POA: Insufficient documentation

## 2024-10-20 DIAGNOSIS — R110 Nausea: Secondary | ICD-10-CM | POA: Insufficient documentation

## 2024-10-20 DIAGNOSIS — R059 Cough, unspecified: Secondary | ICD-10-CM | POA: Insufficient documentation

## 2024-10-20 DIAGNOSIS — Z79899 Other long term (current) drug therapy: Secondary | ICD-10-CM | POA: Diagnosis not present

## 2024-10-20 DIAGNOSIS — Z5111 Encounter for antineoplastic chemotherapy: Secondary | ICD-10-CM | POA: Diagnosis present

## 2024-10-20 LAB — CBC WITH DIFFERENTIAL (CANCER CENTER ONLY)
Abs Immature Granulocytes: 0.07 K/uL (ref 0.00–0.07)
Basophils Absolute: 0.1 K/uL (ref 0.0–0.1)
Basophils Relative: 1 %
Eosinophils Absolute: 0.1 K/uL (ref 0.0–0.5)
Eosinophils Relative: 0 %
HCT: 38.9 % — ABNORMAL LOW (ref 39.0–52.0)
Hemoglobin: 11.2 g/dL — ABNORMAL LOW (ref 13.0–17.0)
Immature Granulocytes: 1 %
Lymphocytes Relative: 9 %
Lymphs Abs: 1.1 K/uL (ref 0.7–4.0)
MCH: 23.5 pg — ABNORMAL LOW (ref 26.0–34.0)
MCHC: 28.8 g/dL — ABNORMAL LOW (ref 30.0–36.0)
MCV: 81.6 fL (ref 80.0–100.0)
Monocytes Absolute: 0.9 K/uL (ref 0.1–1.0)
Monocytes Relative: 7 %
Neutro Abs: 10.4 K/uL — ABNORMAL HIGH (ref 1.7–7.7)
Neutrophils Relative %: 82 %
Platelet Count: 285 K/uL (ref 150–400)
RBC: 4.77 MIL/uL (ref 4.22–5.81)
RDW: 22.5 % — ABNORMAL HIGH (ref 11.5–15.5)
WBC Count: 12.6 K/uL — ABNORMAL HIGH (ref 4.0–10.5)
nRBC: 0 % (ref 0.0–0.2)

## 2024-10-20 LAB — CMP (CANCER CENTER ONLY)
ALT: 40 U/L (ref 0–44)
AST: 35 U/L (ref 15–41)
Albumin: 4 g/dL (ref 3.5–5.0)
Alkaline Phosphatase: 78 U/L (ref 38–126)
Anion gap: 12 (ref 5–15)
BUN: 10 mg/dL (ref 8–23)
CO2: 27 mmol/L (ref 22–32)
Calcium: 9.2 mg/dL (ref 8.9–10.3)
Chloride: 104 mmol/L (ref 98–111)
Creatinine: 1 mg/dL (ref 0.61–1.24)
GFR, Estimated: >60 mL/min
Glucose, Bld: 108 mg/dL — ABNORMAL HIGH (ref 70–99)
Potassium: 3.9 mmol/L (ref 3.5–5.1)
Sodium: 143 mmol/L (ref 135–145)
Total Bilirubin: 0.3 mg/dL (ref 0.0–1.2)
Total Protein: 7.2 g/dL (ref 6.5–8.1)

## 2024-10-20 MED ORDER — SODIUM CHLORIDE 0.9 % IV SOLN
200.0000 mg | Freq: Once | INTRAVENOUS | Status: AC
  Administered 2024-10-20: 200 mg via INTRAVENOUS
  Filled 2024-10-20: qty 200

## 2024-10-20 MED ORDER — SODIUM CHLORIDE 0.9 % IV SOLN: Freq: Once | INTRAVENOUS | Status: AC

## 2024-10-20 MED ORDER — PROCHLORPERAZINE MALEATE 10 MG PO TABS
10.0000 mg | ORAL_TABLET | Freq: Once | ORAL | Status: AC
  Administered 2024-10-20: 10 mg via ORAL
  Filled 2024-10-20: qty 1

## 2024-10-20 MED ORDER — SODIUM CHLORIDE 0.9 % IV SOLN
500.0000 mg/m2 | Freq: Once | INTRAVENOUS | Status: AC
  Administered 2024-10-20: 1000 mg via INTRAVENOUS
  Filled 2024-10-20: qty 40

## 2024-10-20 MED ORDER — SODIUM CHLORIDE 0.9% FLUSH: 10.0000 mL | INTRAVENOUS | Status: DC | PRN

## 2024-10-20 NOTE — Progress Notes (Addendum)
 Authorization for Keytruda , pemetrexed , and cisplatin has been approved per Alcus Gaskins. Okay to proceed with treatment today.  Harlene Nasuti, PharmD Oncology Infusion Pharmacist 10/20/2024 10:24 AM

## 2024-10-27 ENCOUNTER — Ambulatory Visit (INDEPENDENT_AMBULATORY_CARE_PROVIDER_SITE_OTHER): Payer: Self-pay | Admitting: Family Medicine

## 2024-10-27 ENCOUNTER — Encounter: Payer: Self-pay | Admitting: Internal Medicine

## 2024-10-27 ENCOUNTER — Encounter: Payer: Self-pay | Admitting: Family Medicine

## 2024-10-27 ENCOUNTER — Ambulatory Visit: Payer: Self-pay | Admitting: Family Medicine

## 2024-10-27 ENCOUNTER — Other Ambulatory Visit: Payer: Self-pay

## 2024-10-27 ENCOUNTER — Other Ambulatory Visit (HOSPITAL_COMMUNITY): Payer: Self-pay

## 2024-10-27 VITALS — BP 142/93 | HR 133 | Temp 97.7°F | Ht 71.5 in | Wt 182.6 lb

## 2024-10-27 DIAGNOSIS — F3340 Major depressive disorder, recurrent, in remission, unspecified: Secondary | ICD-10-CM | POA: Diagnosis not present

## 2024-10-27 DIAGNOSIS — E1165 Type 2 diabetes mellitus with hyperglycemia: Secondary | ICD-10-CM | POA: Diagnosis not present

## 2024-10-27 DIAGNOSIS — E1159 Type 2 diabetes mellitus with other circulatory complications: Secondary | ICD-10-CM | POA: Diagnosis not present

## 2024-10-27 DIAGNOSIS — E2740 Unspecified adrenocortical insufficiency: Secondary | ICD-10-CM | POA: Diagnosis not present

## 2024-10-27 DIAGNOSIS — I152 Hypertension secondary to endocrine disorders: Secondary | ICD-10-CM

## 2024-10-27 DIAGNOSIS — E1169 Type 2 diabetes mellitus with other specified complication: Secondary | ICD-10-CM

## 2024-10-27 DIAGNOSIS — C7931 Secondary malignant neoplasm of brain: Secondary | ICD-10-CM

## 2024-10-27 DIAGNOSIS — R066 Hiccough: Secondary | ICD-10-CM

## 2024-10-27 DIAGNOSIS — C3491 Malignant neoplasm of unspecified part of right bronchus or lung: Secondary | ICD-10-CM

## 2024-10-27 DIAGNOSIS — J069 Acute upper respiratory infection, unspecified: Secondary | ICD-10-CM | POA: Diagnosis not present

## 2024-10-27 DIAGNOSIS — E785 Hyperlipidemia, unspecified: Secondary | ICD-10-CM | POA: Diagnosis not present

## 2024-10-27 LAB — BAYER DCA HB A1C WAIVED: HB A1C (BAYER DCA - WAIVED): 5.8 % — ABNORMAL HIGH (ref 4.8–5.6)

## 2024-10-27 MED ORDER — AZITHROMYCIN 500 MG PO TABS: 500.0000 mg | ORAL_TABLET | Freq: Every day | ORAL | 0 refills | Status: DC

## 2024-10-27 MED ORDER — VENLAFAXINE HCL ER 75 MG PO CP24
150.0000 mg | ORAL_CAPSULE | Freq: Every day | ORAL | 1 refills | Status: AC
  Filled 2024-10-27 – 2024-11-11 (×4): qty 180, 90d supply, fill #0

## 2024-10-27 NOTE — Patient Instructions (Addendum)

## 2024-10-27 NOTE — Progress Notes (Signed)
 "    Subjective:  Patient ID: Bradley Goodman, male    DOB: 1956-09-19, 69 y.o.   MRN: [id number]  Patient Care Team: [name], FNP as PCP - General (Family Medicine) Stacia Diannah SQUIBB, MD as PCP - Cardiology (Cardiology) Shaaron Lamar HERO, MD as Consulting Physician (Gastroenterology) Vicci Mcardle, OD (Optometry) Sheron Lorette CINDERELLA DEVONNA as Physician Assistant (Cardiology)   Chief Complaint:  Diabetes (3 month follow up ), Hiccups (Causing patient to have trouble breathing and to get cold ), Weakness (Ongoing on and off ), and Flank Pain (Patient states this morning he had a slight pain in the left kidney )   HPI: Bradley Goodman is a 69 y.o. male presenting on 10/27/2024 for Diabetes (3 month follow up ), Hiccups (Causing patient to have trouble breathing and to get cold ), Weakness (Ongoing on and off ), and Flank Pain (Patient states this morning he had a slight pain in the left kidney )   Bradley Goodman is a 69 year old male with diabetes and adrenal insufficiency who presents for chronic follow up. He complains with persistent hiccups and cough.  He has been experiencing persistent hiccups that are bothersome and is currently taking Thorazine  to manage them, which causes drowsiness. The hiccups have affected his appetite, leading to weight loss from 195 pounds to 182 pounds.  He mentions mild, intermittent flank pain that has resolved. Reports this is associated with the hiccups.   His diabetes is well-controlled with stable blood sugar levels, and he monitors them regularly with no recent issues.  He recently completed a seven-day course of Tamiflu  for the flu without side effects. No fever is present, but he has a persistent cough and congestion. The cough is mild and not associated with cold symptoms.  He is on Decadron  for adrenal insufficiency and notes slight weight loss, attributing it to decreased appetite from hiccups.  He denies any adverse reactions to his last  oncology infusion and reports no issues with his current medication regimen.          Relevant past medical, surgical, family, and social history reviewed and updated as indicated.  Allergies and medications reviewed and updated. Data reviewed: Chart in Epic.   Past Medical History:  Diagnosis Date   Brain cancer [facility])    Cervical spondylolysis    Depression    Essential hypertension    Family history of breast cancer    GERD (gastroesophageal reflux disease)    History of kidney stones    History of migraine    Hyperlipidemia    Hypertension    lung ca with mets to brain    Lung cancer (HCC)    PONV (postoperative nausea and vomiting)    Type 2 diabetes mellitus (HCC)     Past Surgical History:  Procedure Laterality Date   Bilateral inguinal hernia repair     BRONCHIAL NEEDLE ASPIRATION BIOPSY  04/05/2021   Procedure: BRONCHIAL NEEDLE ASPIRATION BIOPSIES;  Surgeon: Brenna Adine CROME, DO;  Location: [facility];  Service: Pulmonary;;   COLONOSCOPY  01/23/2012   Procedure: COLONOSCOPY;  Surgeon: Lamar HERO Shaaron, MD;  Location: [facility];  Service: Endoscopy;  Laterality: N/A;  9:30 AM   COLONOSCOPY N/A 10/11/2015   Procedure: COLONOSCOPY;  Surgeon: Lamar HERO Shaaron, MD;  Location: [facility];  Service: Endoscopy;  Laterality: N/A;  830   COLONOSCOPY N/A 10/14/2019   Procedure: COLONOSCOPY;  Surgeon: Shaaron Lamar HERO, MD;  Location: [facility];  Service: Endoscopy;  Laterality: N/A;  1:45   HERNIA REPAIR     POLYPECTOMY  10/14/2019   Procedure: POLYPECTOMY;  Surgeon: Shaaron Lamar HERO, MD;  Location: [facility];  Service: Endoscopy;;  ascending colon, descending colon   VIDEO BRONCHOSCOPY WITH ENDOBRONCHIAL ULTRASOUND N/A 04/05/2021   Procedure: VIDEO BRONCHOSCOPY WITH ENDOBRONCHIAL ULTRASOUND;  Surgeon: Brenna Adine CROME, DO;  Location: [facility];  Service: Pulmonary;  Laterality: N/A;    Social History   Socioeconomic History   Marital status: Married     Spouse name: Not on file   Number of children: Not on file   Years of education: Not on file   Highest education level: Bachelor's degree (e.g., BA, AB, BS)  Occupational History   Not on file  Tobacco Use   Smoking status: Every Day    Current packs/day: 0.50    Average packs/day: 0.5 packs/day for 51.0 years (25.5 ttl pk-yrs)    Types: Cigarettes    Start date: 10/30/1973   Smokeless tobacco: Never  Vaping Use   Vaping status: Never Used  Substance and Sexual Activity   Alcohol use: Not Currently    Comment: One drink every 6 months.   Drug use: No   Sexual activity: Yes  Other Topics Concern   Not on file  Social History Narrative   Not on file   Social Drivers of Health   Tobacco Use: High Risk (10/27/2024)   Patient History    Smoking Tobacco Use: Every Day    Smokeless Tobacco Use: Never    Passive Exposure: Not on file  Financial Resource Strain: Low Risk (07/22/2024)   Overall Financial Resource Strain (CARDIA)    Difficulty of Paying Living Expenses: Not very hard  Food Insecurity: No Food Insecurity (10/02/2024)   Epic    Worried About Radiation Protection Practitioner of Food in the Last Year: Never true    Ran Out of Food in the Last Year: Never true  Transportation Needs: No Transportation Needs (10/02/2024)   Epic    Lack of Transportation (Medical): No    Lack of Transportation (Non-Medical): No  Physical Activity: Unknown (04/19/2024)   Exercise Vital Sign    Days of Exercise per Week: 1 day    Minutes of Exercise per Session: Patient declined  Stress: No Stress Concern Present (04/19/2024)   [employer] of Occupational Health - Occupational Stress Questionnaire    Feeling of Stress: Not at all  Social Connections: Moderately Isolated (10/02/2024)   Social Connection and Isolation Panel    Frequency of Communication with Friends and Family: More than three times a week    Frequency of Social Gatherings with Friends and Family: Three times a week    Attends Religious  Services: Never    Active Member of Clubs or Organizations: No    Attends Banker Meetings: Never    Marital Status: Married  Catering Manager Violence: Not At Risk (10/02/2024)   Epic    Fear of Current or Ex-Partner: No    Emotionally Abused: No    Physically Abused: No    Sexually Abused: No  Depression (PHQ2-9): Low Risk (10/27/2024)   Depression (PHQ2-9)    PHQ-2 Score: 4  Alcohol Screen: Low Risk (07/22/2024)   Alcohol Screen    Last Alcohol Screening Score (AUDIT): 1  Housing: Low Risk (10/02/2024)   Epic    Unable to Pay for Housing in the Last Year: No    Number of Times Moved in  the Last Year: 0    Homeless in the Last Year: No  Utilities: Not At Risk (10/02/2024)   Epic    Threatened with loss of utilities: No  Health Literacy: Not on file    Outpatient Encounter Medications as of 10/27/2024  Medication Sig   acetaminophen  (TYLENOL ) 500 MG tablet Take 500 mg by mouth every 6 (six) hours as needed for mild pain (pain score 1-3) or fever.   albuterol  (VENTOLIN  HFA) 108 (90 Base) MCG/ACT inhaler Inhale 2 puffs into the lungs every 6 (six) hours as needed for wheezing or shortness of breath.   azithromycin  (ZITHROMAX ) 500 MG tablet Take 1 tablet (500 mg total) by mouth daily for 3 days.   chlorproMAZINE  (THORAZINE ) 10 MG tablet Take 1 tablet (10 mg total) by mouth 3 (three) times daily as needed for hiccoughs.   cholecalciferol  (VITAMIN D ) 1000 UNITS tablet Take 1,000 Units by mouth every evening.   colchicine  0.6 MG tablet Take 1 tablet (0.6 mg total) by mouth 2 (two) times daily.   Continuous Glucose Sensor (FREESTYLE LIBRE 3 PLUS SENSOR) MISC Change sensor every 15 days.   dexamethasone  (DECADRON ) 1 MG tablet Take 1 tablet (1 mg total) by mouth daily with breakfast.   famotidine  (PEPCID ) 10 MG tablet Take 10 mg by mouth at bedtime. Chewable   folic acid  (FOLVITE ) 1 MG tablet Take 1 tablet (1 mg total) by mouth daily.   Krill Oil 300 MG CAPS Take 300 mg by  mouth every evening.   levothyroxine  (SYNTHROID ) 112 MCG tablet Take 1 tablet (112 mcg total) by mouth daily before breakfast.   loratadine  (CLARITIN ) 10 MG tablet Take 10 mg by mouth every evening.    mirtazapine  (REMERON ) 15 MG tablet Take 1 tablet (15 mg total) by mouth at bedtime.   Multiple Vitamins-Minerals (ONE A DAY MEN 50 PLUS) TABS Take 1 tablet by mouth at bedtime.   ondansetron  (ZOFRAN ) 4 MG tablet Take 2 tablets (8 mg total) by mouth every 8 (eight) hours as needed for nausea.   pantoprazole  (PROTONIX ) 40 MG tablet Take 1 tablet (40 mg total) by mouth every evening.   polyethylene glycol powder (GLYCOLAX /MIRALAX ) 17 GM/SCOOP powder Take 119 g by mouth daily as needed for mild constipation or moderate constipation.   rosuvastatin  (CRESTOR ) 20 MG tablet Take 1 tablet (20 mg total) by mouth daily.   SYRINGE-NEEDLE, DISP, 3 ML (B-D 3CC LUER-LOK SYR 21GX1-1/2) 21G X 1-1/2 3 ML MISC Use to inject testosterone  every week   testosterone  cypionate (DEPOTESTOTERONE CYPIONATE) 100 MG/ML injection Take 50mg  ( 0.11ml) and 100mg  ( 1ml) into muscle alternatively weekly.   [DISCONTINUED] venlafaxine  XR (EFFEXOR -XR) 75 MG 24 hr capsule Take 2 capsules (150 mg total) by mouth daily with breakfast. (Patient taking differently: Take 150 mg by mouth at bedtime.)   venlafaxine  XR (EFFEXOR -XR) 75 MG 24 hr capsule Take 2 capsules (150 mg total) by mouth at bedtime.   No facility-administered encounter medications on file as of 10/27/2024.    Allergies[1]  Pertinent ROS per HPI, otherwise unremarkable      Objective:  BP (!) 142/93   Pulse (!) 133   Temp 97.7 F (36.5 C)   Ht 5' 11.5 (1.816 m)   Wt 182 lb 9.6 oz (82.8 kg)   SpO2 96%   BMI 25.11 kg/m    Wt Readings from Last 3 Encounters:  10/27/24 182 lb 9.6 oz (82.8 kg)  10/20/24 186 lb 14.4 oz (84.8 kg)  10/15/24 184 lb (  83.5 kg)    Physical Exam Vitals and nursing note reviewed.  Constitutional:      General: He is not in acute  distress.    Appearance: He is ill-appearing (chronically ill). He is not toxic-appearing or diaphoretic.  HENT:     Head: Normocephalic and atraumatic.     Mouth/Throat:     Mouth: Mucous membranes are moist.  Eyes:     Conjunctiva/sclera: Conjunctivae normal.     Pupils: Pupils are equal, round, and reactive to light.  Cardiovascular:     Rate and Rhythm: Normal rate and regular rhythm.     Heart sounds: Normal heart sounds.  Pulmonary:     Effort: Pulmonary effort is normal.     Breath sounds: Wheezing present.     Comments: Hiccups Abdominal:     General: There is distension (mild).  Musculoskeletal:     Cervical back: Neck supple.  Skin:    General: Skin is warm and dry.     Capillary Refill: Capillary refill takes less than 2 seconds.  Neurological:     General: No focal deficit present.     Mental Status: He is alert and oriented to person, place, and time.     Motor: Weakness (generalized) present.  Psychiatric:        Mood and Affect: Mood normal.        Behavior: Behavior normal.        Thought Content: Thought content normal.        Judgment: Judgment normal.      Results for orders placed or performed in visit on 10/20/24  CMP (Cancer Center only)   Collection Time: 10/20/24 10:58 AM  Result Value Ref Range   Sodium 143 135 - 145 mmol/L   Potassium 3.9 3.5 - 5.1 mmol/L   Chloride 104 98 - 111 mmol/L   CO2 27 22 - 32 mmol/L   Glucose, Bld 108 (H) 70 - 99 mg/dL   BUN 10 8 - 23 mg/dL   Creatinine 8.99 9.38 - 1.24 mg/dL   Calcium  9.2 8.9 - 10.3 mg/dL   Total Protein 7.2 6.5 - 8.1 g/dL   Albumin 4.0 3.5 - 5.0 g/dL   AST 35 15 - 41 U/L   ALT 40 0 - 44 U/L   Alkaline Phosphatase 78 38 - 126 U/L   Total Bilirubin 0.3 0.0 - 1.2 mg/dL   GFR, Estimated >39 >39 mL/min   Anion gap 12 5 - 15  CBC with Differential (Cancer Center Only)   Collection Time: 10/20/24 10:58 AM  Result Value Ref Range   WBC Count 12.6 (H) 4.0 - 10.5 K/uL   RBC 4.77 4.22 - 5.81  MIL/uL   Hemoglobin 11.2 (L) 13.0 - 17.0 g/dL   HCT 61.0 (L) 60.9 - 47.9 %   MCV 81.6 80.0 - 100.0 fL   MCH 23.5 (L) 26.0 - 34.0 pg   MCHC 28.8 (L) 30.0 - 36.0 g/dL   RDW 77.4 (H) 88.4 - 84.4 %   Platelet Count 285 150 - 400 K/uL   nRBC 0.0 0.0 - 0.2 %   Neutrophils Relative % 82 %   Neutro Abs 10.4 (H) 1.7 - 7.7 K/uL   Lymphocytes Relative 9 %   Lymphs Abs 1.1 0.7 - 4.0 K/uL   Monocytes Relative 7 %   Monocytes Absolute 0.9 0.1 - 1.0 K/uL   Eosinophils Relative 0 %   Eosinophils Absolute 0.1 0.0 - 0.5 K/uL   Basophils Relative 1 %  Basophils Absolute 0.1 0.0 - 0.1 K/uL   Immature Granulocytes 1 %   Abs Immature Granulocytes 0.07 0.00 - 0.07 K/uL   *Note: Due to a large number of results and/or encounters for the requested time period, some results have not been displayed. A complete set of results can be found in Results Review.       Pertinent labs & imaging results that were available during my care of the patient were reviewed by me and considered in my medical decision making.  Assessment & Plan:  Okie was seen today for diabetes, hiccups, weakness and flank pain.  Diagnoses and all orders for this visit:  Type 2 diabetes mellitus with hyperglycemia, without long-term current use of insulin  (HCC) -     Bayer DCA Hb A1c Waived  Recurrent major depression in remission -     Bayer DCA Hb A1c Waived -     venlafaxine  XR (EFFEXOR -XR) 75 MG 24 hr capsule; Take 2 capsules (150 mg total) by mouth at bedtime.  Hiccups -     Bayer DCA Hb A1c Waived  Hyperlipidemia associated with type 2 diabetes mellitus (HCC) -     Bayer DCA Hb A1c Waived  Hypertension associated with type 2 diabetes mellitus (HCC) -     Bayer DCA Hb A1c Waived  Secondary malignant neoplasm of brain (HCC) -     Bayer DCA Hb A1c Waived  Adenocarcinoma of right lung, stage 4 (HCC) -     Bayer DCA Hb A1c Waived  Adrenal insufficiency -     Bayer DCA Hb A1c Waived  URI with cough and  congestion -     Bayer DCA Hb A1c Waived -     azithromycin  (ZITHROMAX ) 500 MG tablet; Take 1 tablet (500 mg total) by mouth daily for 3 days.       Acute upper respiratory infection with recent influenza Recent influenza with ongoing cough and congestion. No fever reported. Completed a 7-day course of Tamiflu  without side effects. Wheezing present, indicating possible secondary bacterial infection. - Prescribed azithromycin  for potential secondary bacterial infection - Continue Decadron  as prescribed  Hiccups Persistent hiccups possibly exacerbated by recent influenza treatment. Hiccups may be causing muscle pain in the flank area. - Increased Thorazine  dosage for the next couple of days to manage hiccups - Instructed to report if hiccups do not improve in the next couple of days  Type 2 diabetes mellitus with hyperglycemia Blood sugars are well-controlled with recent readings of 5.8 and 6.6.  Adrenal insufficiency Managed with Decadron . Weight loss noted, possibly due to decreased appetite from hiccups. - Continue Decadron  as prescribed  Stage 4 adenocarcinoma of right lung Ongoing management with oncology. Recent infusion without issues. MRI scheduled for January 12th and follow-up with oncology on January 16th. - Continue follow-up with oncology as scheduled - Ensure MRI is completed on January 12th          Continue all other maintenance medications.  Follow up plan: Return in 3 months (on 01/25/2025).   Continue healthy lifestyle choices, including diet (rich in fruits, vegetables, and lean proteins, and low in salt and simple carbohydrates) and exercise (at least 30 minutes of moderate physical activity daily).  Educational handout given for DM  The above assessment and management plan was discussed with the patient. The patient verbalized understanding of and has agreed to the management plan. Patient is aware to call the clinic if they develop any new symptoms or if  symptoms persist or worsen.  Patient is aware when to return to the clinic for a follow-up visit. Patient educated on when it is appropriate to go to the emergency department.   Rosaline Bruns, FNP-C [facility] [phone]     [1] No Known Allergies  "

## 2024-10-28 ENCOUNTER — Emergency Department (HOSPITAL_COMMUNITY)

## 2024-10-28 ENCOUNTER — Other Ambulatory Visit: Payer: Self-pay

## 2024-10-28 ENCOUNTER — Observation Stay (HOSPITAL_COMMUNITY)
Admission: EM | Admit: 2024-10-28 | Discharge: 2024-10-29 | Disposition: A | Discharge status: Home / Self Care | Attending: Internal Medicine | Admitting: Internal Medicine

## 2024-10-28 ENCOUNTER — Encounter (HOSPITAL_COMMUNITY): Payer: Self-pay

## 2024-10-28 DIAGNOSIS — E1169 Type 2 diabetes mellitus with other specified complication: Secondary | ICD-10-CM | POA: Diagnosis present

## 2024-10-28 DIAGNOSIS — E099 Drug or chemical induced diabetes mellitus without complications: Secondary | ICD-10-CM | POA: Diagnosis not present

## 2024-10-28 DIAGNOSIS — R1084 Generalized abdominal pain: Principal | ICD-10-CM

## 2024-10-28 DIAGNOSIS — J90 Pleural effusion, not elsewhere classified: Secondary | ICD-10-CM | POA: Insufficient documentation

## 2024-10-28 DIAGNOSIS — E230 Hypopituitarism: Secondary | ICD-10-CM | POA: Diagnosis not present

## 2024-10-28 DIAGNOSIS — I700 Atherosclerosis of aorta: Secondary | ICD-10-CM | POA: Insufficient documentation

## 2024-10-28 DIAGNOSIS — I3139 Other pericardial effusion (noninflammatory): Secondary | ICD-10-CM | POA: Diagnosis not present

## 2024-10-28 DIAGNOSIS — I10 Essential (primary) hypertension: Secondary | ICD-10-CM | POA: Diagnosis not present

## 2024-10-28 DIAGNOSIS — K5730 Diverticulosis of large intestine without perforation or abscess without bleeding: Secondary | ICD-10-CM | POA: Insufficient documentation

## 2024-10-28 DIAGNOSIS — C3490 Malignant neoplasm of unspecified part of unspecified bronchus or lung: Secondary | ICD-10-CM | POA: Insufficient documentation

## 2024-10-28 DIAGNOSIS — R0602 Shortness of breath: Secondary | ICD-10-CM | POA: Diagnosis present

## 2024-10-28 DIAGNOSIS — K811 Chronic cholecystitis: Secondary | ICD-10-CM

## 2024-10-28 DIAGNOSIS — F32A Depression, unspecified: Secondary | ICD-10-CM | POA: Diagnosis not present

## 2024-10-28 DIAGNOSIS — E785 Hyperlipidemia, unspecified: Secondary | ICD-10-CM

## 2024-10-28 DIAGNOSIS — E782 Mixed hyperlipidemia: Secondary | ICD-10-CM | POA: Diagnosis not present

## 2024-10-28 DIAGNOSIS — E039 Hypothyroidism, unspecified: Secondary | ICD-10-CM | POA: Diagnosis not present

## 2024-10-28 DIAGNOSIS — J181 Lobar pneumonia, unspecified organism: Principal | ICD-10-CM | POA: Diagnosis present

## 2024-10-28 DIAGNOSIS — N200 Calculus of kidney: Secondary | ICD-10-CM | POA: Insufficient documentation

## 2024-10-28 DIAGNOSIS — C3491 Malignant neoplasm of unspecified part of right bronchus or lung: Secondary | ICD-10-CM | POA: Diagnosis not present

## 2024-10-28 DIAGNOSIS — Z79899 Other long term (current) drug therapy: Secondary | ICD-10-CM | POA: Diagnosis not present

## 2024-10-28 DIAGNOSIS — E2740 Unspecified adrenocortical insufficiency: Secondary | ICD-10-CM | POA: Insufficient documentation

## 2024-10-28 DIAGNOSIS — F17210 Nicotine dependence, cigarettes, uncomplicated: Secondary | ICD-10-CM | POA: Insufficient documentation

## 2024-10-28 LAB — CBC WITH DIFFERENTIAL/PLATELET
Abs Immature Granulocytes: 0.2 K/uL — ABNORMAL HIGH (ref 0.00–0.07)
Basophils Absolute: 0 K/uL (ref 0.0–0.1)
Basophils Relative: 0 %
Eosinophils Absolute: 0 K/uL (ref 0.0–0.5)
Eosinophils Relative: 0 %
HCT: 35.1 % — ABNORMAL LOW (ref 39.0–52.0)
Hemoglobin: 10 g/dL — ABNORMAL LOW (ref 13.0–17.0)
Lymphocytes Relative: 25 %
Lymphs Abs: 2.2 K/uL (ref 0.7–4.0)
MCH: 23.4 pg — ABNORMAL LOW (ref 26.0–34.0)
MCHC: 28.5 g/dL — ABNORMAL LOW (ref 30.0–36.0)
MCV: 82.2 fL (ref 80.0–100.0)
Metamyelocytes Relative: 2 %
Monocytes Absolute: 0.9 K/uL (ref 0.1–1.0)
Monocytes Relative: 10 %
Neutro Abs: 5.5 K/uL (ref 1.7–7.7)
Neutrophils Relative %: 63 %
Platelets: 205 K/uL (ref 150–400)
RBC: 4.27 MIL/uL (ref 4.22–5.81)
RDW: 22.5 % — ABNORMAL HIGH (ref 11.5–15.5)
Smear Review: NORMAL
WBC: 8.7 K/uL (ref 4.0–10.5)
nRBC: 0.5 % — ABNORMAL HIGH (ref 0.0–0.2)

## 2024-10-28 LAB — TROPONIN T, HIGH SENSITIVITY
Troponin T High Sensitivity: 56 ng/L — ABNORMAL HIGH (ref 0–19)
Troponin T High Sensitivity: 51 ng/L — ABNORMAL HIGH (ref 0–19)

## 2024-10-28 LAB — COMPREHENSIVE METABOLIC PANEL WITH GFR
ALT: 41 U/L (ref 0–44)
AST: 40 U/L (ref 15–41)
Albumin: 3.6 g/dL (ref 3.5–5.0)
Alkaline Phosphatase: 100 U/L (ref 38–126)
Anion gap: 13 (ref 5–15)
BUN: 8 mg/dL (ref 8–23)
CO2: 26 mmol/L (ref 22–32)
Calcium: 8.9 mg/dL (ref 8.9–10.3)
Chloride: 101 mmol/L (ref 98–111)
Creatinine, Ser: 1.04 mg/dL (ref 0.61–1.24)
GFR, Estimated: >60 mL/min
Glucose, Bld: 127 mg/dL — ABNORMAL HIGH (ref 70–99)
Potassium: 3.4 mmol/L — ABNORMAL LOW (ref 3.5–5.1)
Sodium: 140 mmol/L (ref 135–145)
Total Bilirubin: 0.3 mg/dL (ref 0.0–1.2)
Total Protein: 7.2 g/dL (ref 6.5–8.1)

## 2024-10-28 LAB — PROCALCITONIN: Procalcitonin: 0.14 ng/mL

## 2024-10-28 LAB — LIPASE, BLOOD: Lipase: 20 U/L (ref 11–51)

## 2024-10-28 LAB — PRO BRAIN NATRIURETIC PEPTIDE: Pro Brain Natriuretic Peptide: 263 pg/mL

## 2024-10-28 MED ORDER — ENOXAPARIN SODIUM 40 MG/0.4ML IJ SOSY
40.0000 mg | PREFILLED_SYRINGE | INTRAMUSCULAR | Status: DC
  Administered 2024-10-28: 40 mg via SUBCUTANEOUS
  Filled 2024-10-28: qty 0.4

## 2024-10-28 MED ORDER — DOXYCYCLINE HYCLATE 100 MG PO TABS
100.0000 mg | ORAL_TABLET | Freq: Two times a day (BID) | ORAL | Status: DC
  Administered 2024-10-29: 100 mg via ORAL
  Filled 2024-10-28: qty 1

## 2024-10-28 MED ORDER — IOHEXOL 350 MG/ML SOLN
100.0000 mL | Freq: Once | INTRAVENOUS | Status: AC | PRN
  Administered 2024-10-28: 100 mL via INTRAVENOUS

## 2024-10-28 MED ORDER — OMEGA-3-ACID ETHYL ESTERS 1 G PO CAPS
1.0000 | ORAL_CAPSULE | Freq: Every evening | ORAL | Status: DC
  Administered 2024-10-28: 1 g via ORAL
  Filled 2024-10-28: qty 1

## 2024-10-28 MED ORDER — DEXAMETHASONE 4 MG PO TABS
2.0000 mg | ORAL_TABLET | Freq: Every day | ORAL | Status: DC
  Administered 2024-10-28 – 2024-10-29 (×2): 2 mg via ORAL
  Filled 2024-10-28 (×2): qty 1

## 2024-10-28 MED ORDER — VENLAFAXINE HCL ER 75 MG PO CP24
150.0000 mg | ORAL_CAPSULE | Freq: Every day | ORAL | Status: DC
  Administered 2024-10-28: 150 mg via ORAL
  Filled 2024-10-28: qty 2

## 2024-10-28 MED ORDER — MIRTAZAPINE 15 MG PO TABS
15.0000 mg | ORAL_TABLET | Freq: Every day | ORAL | Status: DC
  Administered 2024-10-28: 15 mg via ORAL
  Filled 2024-10-28: qty 1

## 2024-10-28 MED ORDER — LEVOTHYROXINE SODIUM 112 MCG PO TABS: 112.0000 ug | ORAL_TABLET | Freq: Every day | ORAL | Status: DC

## 2024-10-28 MED ORDER — LORATADINE 10 MG PO TABS
10.0000 mg | ORAL_TABLET | Freq: Every evening | ORAL | Status: DC
  Administered 2024-10-28: 10 mg via ORAL
  Filled 2024-10-28: qty 1

## 2024-10-28 MED ORDER — POLYETHYLENE GLYCOL 3350 17 G PO PACK: 17.0000 g | PACK | Freq: Every day | ORAL | Status: DC | PRN

## 2024-10-28 MED ORDER — HYDROMORPHONE HCL 1 MG/ML IJ SOLN
1.0000 mg | Freq: Once | INTRAMUSCULAR | Status: AC
  Administered 2024-10-28: 1 mg via INTRAVENOUS
  Filled 2024-10-28: qty 1

## 2024-10-28 MED ORDER — ONDANSETRON HCL 4 MG PO TABS: 4.0000 mg | ORAL_TABLET | Freq: Four times a day (QID) | ORAL | Status: DC | PRN

## 2024-10-28 MED ORDER — SODIUM CHLORIDE 0.9 % IV SOLN
2.0000 g | Freq: Once | INTRAVENOUS | Status: AC
  Administered 2024-10-28: 2 g via INTRAVENOUS
  Filled 2024-10-28: qty 20

## 2024-10-28 MED ORDER — LACTATED RINGERS IV SOLN: INTRAVENOUS | Status: AC

## 2024-10-28 MED ORDER — ACETAMINOPHEN 325 MG PO TABS: 650.0000 mg | ORAL_TABLET | Freq: Four times a day (QID) | ORAL | Status: DC | PRN

## 2024-10-28 MED ORDER — AZITHROMYCIN 250 MG PO TABS
500.0000 mg | ORAL_TABLET | Freq: Once | ORAL | Status: AC
  Administered 2024-10-28: 500 mg via ORAL
  Filled 2024-10-28: qty 2

## 2024-10-28 MED ORDER — SODIUM CHLORIDE 0.9 % IV SOLN
2.0000 g | INTRAVENOUS | Status: DC
  Administered 2024-10-29: 2 g via INTRAVENOUS
  Filled 2024-10-28: qty 20

## 2024-10-28 MED ORDER — ROSUVASTATIN CALCIUM 20 MG PO TABS
20.0000 mg | ORAL_TABLET | Freq: Every day | ORAL | Status: DC
  Administered 2024-10-28 – 2024-10-29 (×2): 20 mg via ORAL
  Filled 2024-10-28 (×2): qty 1

## 2024-10-28 MED ORDER — COLCHICINE 0.6 MG PO TABS
0.6000 mg | ORAL_TABLET | Freq: Two times a day (BID) | ORAL | Status: DC
  Administered 2024-10-28 – 2024-10-29 (×2): 0.6 mg via ORAL
  Filled 2024-10-28 (×2): qty 1

## 2024-10-28 MED ORDER — CHLORPROMAZINE HCL 10 MG PO TABS: 10.0000 mg | ORAL_TABLET | Freq: Three times a day (TID) | ORAL | Status: DC | PRN

## 2024-10-28 MED ORDER — ACETAMINOPHEN 650 MG RE SUPP: 650.0000 mg | Freq: Four times a day (QID) | RECTAL | Status: DC | PRN

## 2024-10-28 MED ORDER — FAMOTIDINE 20 MG PO TABS
10.0000 mg | ORAL_TABLET | Freq: Every day | ORAL | Status: DC
  Administered 2024-10-28: 10 mg via ORAL
  Filled 2024-10-28: qty 1

## 2024-10-28 MED ORDER — ONDANSETRON HCL 4 MG/2ML IJ SOLN: 4.0000 mg | Freq: Four times a day (QID) | INTRAMUSCULAR | Status: DC | PRN

## 2024-10-28 MED ORDER — ONDANSETRON HCL 4 MG/2ML IJ SOLN
4.0000 mg | Freq: Once | INTRAMUSCULAR | Status: AC
  Administered 2024-10-28: 4 mg via INTRAVENOUS
  Filled 2024-10-28: qty 2

## 2024-10-28 MED ORDER — FOLIC ACID 1 MG PO TABS
1.0000 mg | ORAL_TABLET | Freq: Every day | ORAL | Status: DC
  Administered 2024-10-28 – 2024-10-29 (×2): 1 mg via ORAL
  Filled 2024-10-28 (×2): qty 1

## 2024-10-28 MED ORDER — PANTOPRAZOLE SODIUM 40 MG PO TBEC
40.0000 mg | DELAYED_RELEASE_TABLET | Freq: Every evening | ORAL | Status: DC
  Administered 2024-10-28: 40 mg via ORAL
  Filled 2024-10-28: qty 1

## 2024-10-28 MED ORDER — SODIUM CHLORIDE 0.9 % IV BOLUS
500.0000 mL | Freq: Once | INTRAVENOUS | Status: AC
  Administered 2024-10-28: 500 mL via INTRAVENOUS

## 2024-10-28 NOTE — ED Notes (Signed)
 Called CCMD in regards to unhooking patient from cardiac monitor, spoke with Daryl.

## 2024-10-28 NOTE — ED Provider Notes (Signed)
" °  Physical Exam  BP (!) 141/92 (BP Location: Right Arm)   Pulse (!) 119   Temp 98.8 F (37.1 C) (Oral)   Resp 20   SpO2 99%   Physical Exam  Procedures  Procedures  ED Course / MDM   Clinical Course as of 10/28/24 1941  Wed Oct 28, 2024  9344 Assumed care from Dr Haze. 69 yo M hx of stage IV lung cancer who pw abdominal pain and chest pain. On palliative immunotherapy for lung cancer. Having bms but no vomiting. Having some RUQ ttp. CT for staging in the past few months.  [RP]  0830 CT scan shows increasing size of his right upper lobe pulmonary nodule.  Also shows patchy nodular opacifications concerning for pneumonia versus malignancy.  No acute findings in the abdomen.  On repeat evaluations having some right upper quadrant abdominal pain.  New oxygen requirement after Dilaudid .  Was tachycardic.  Family reports low-grade fevers and cough at home.  Will give him antibiotics in case of pneumonia as well as RUQ us . [RP]  1100 RUQ US  eqivocal for cholecystitis. Dr Evonnie general surgery consulted.  Recommends attempting to get symptom control.  If he remains symptomatic next that would be a HIDA scan and potential evaluation for percutaneous cholecystostomy if there are concerns for cholecystitis.  Discussed with Dr. Evonnie from hospitalist for admission [RP]    Clinical Course User Index [RP] Yolande Lamar BROCKS, MD   Medical Decision Making Amount and/or Complexity of Data Reviewed Labs: ordered. Radiology: ordered.  Risk Prescription drug management. Decision regarding hospitalization.      Yolande Lamar BROCKS, MD 10/28/24 1941  "

## 2024-10-28 NOTE — Hospital Course (Addendum)
 69 year old male with a history of stage IV NSCLC (dx 03/2021), adrenal insufficiency, hypothyroidism, depression, hyperlipidemia presenting with right sided abdominal pain that began on 10/27/2024.  The patient had been in his usual state of health.  After he ate breakfast on 10/27/2024 he began developing some mild right-sided abdominal pain that migrated to his right lower chest.  It was constant in nature but progressively worsened over the day.  He had some nausea without emesis.  He denies fevers, chills, chest pain, vomiting, diarrhea, dysuria, hematuria.  He has no hematochezia or melena.  He did say that he began developing some shortness of breath on the evening of 10/27/2024.  He denies any new medications in the past 2 to 3 weeks. In the ED, the patient was afebrile and hemodynamically stable.  Oxygen saturation was 97% on 2 L.  He was tachycardic in 110s.  WBC 8.7, hemoglobin 10.0, platelet 205.  Sodium 140, potassium 3.4, bicarbonate 26, serum creatinine 1.04.  proBNP 263.  Troponin 56>> 51.  CTA chest was negative for PE.  There is new patchy bilateral nodular airspace opacities.  There was a slight increase in the size of his dominant nodule in the right upper lobe.  There is a stable pericardial effusion.  There are stable bilateral pleural effusions.  CT abdomen and pelvis was negative for any acute findings.  Right upper quadrant ultrasound showed cholelithiasis with mild gallbladder thickening.  There is no pericholecystic fluid.  The patient was started on ceftriaxone  and doxy.  Regarding his oncologic history, the patient has been following Dr. Gatha.  He was diagnosed with NSCLC June 2022 when he presented with right upper lobe lung nodule in addition to right hilar, subcarinal and bilateral mediastinal as well as supraclavicular lymphadenopathy in addition to bone and brain metastasis    The patient underwent SRS (finished 05/04/21) to metastatic brain lesion under the care of Dr. Patrcia  and he is currently undergoing systemic chemotherapy with carboplatin  for AUC of 5, Alimta  500 Mg/M2 and Keytruda  200 Mg IV every 3 weeks status post 58 cycles  He is currently Currently on cycle 58 of treatment, last treatment 10/20/24. Last CT chest, AP showed stable findings with no progression of disease. Last appointment with Dr. Gatha was 09/29/2024.  HIDA scan was ordered.  Cystic duct and common bile duct are patent.  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  General surgery was consulted and recommended advancing the patient's diet.  The patient tolerated his carbohydrate modified diet without abdominal pain.

## 2024-10-28 NOTE — Consult Note (Addendum)
 [facility] Consult  Reason for Consult: Concern for acute cholecystitis Referring Physician: Dr. Jakie    HPI: Bradley Goodman is a 69 y.o. male who presented to the hospital with right sided abdominal pain that began last night.  He states that he was seen at his doctor's office yesterday and felt fine at that appointment.  After eating dinner last night, he began to have right sided abdominal pain.  He did have a little bit of nausea without any emesis.  He has had mild pains like this in the past, but nothing this severe.  He has a past medical history significant for stage IV lung cancer with metastases to the brain and bones, hypertension, GERD, hyperlipidemia, and diabetes not currently on any medications.  He denies use of blood thinning medications.  His surgical history is significant for open right and left inguinal hernia repairs.  He is currently undergoing chemotherapy and his most recent cycle was completed on 1/13, and he states he receives his chemotherapy treatments once every 3 weeks.  In the emergency department, he was noted to be hemodynamically stable.  He has no elevation in LFTs and has no leukocytosis.  He underwent a CT of the abdomen and pelvis which demonstrated no acute findings.  He subsequently underwent an abdominal ultrasound which demonstrated gallbladder wall thickening without a Murphy sign or Perry cholecystic fluid, equivocal for acute cholecystitis.  Upon my evaluation of the patient, he states that he has no abdominal pain at this time.  He also denies nausea and vomiting.  Past Medical History:  Diagnosis Date   Brain cancer [facility])    Cervical spondylolysis    Depression    Essential hypertension    Family history of breast cancer    GERD (gastroesophageal reflux disease)    History of kidney stones    History of migraine    Hyperlipidemia    Hypertension    lung ca with mets to brain    Lung cancer (HCC)    PONV  (postoperative nausea and vomiting)    Type 2 diabetes mellitus (HCC)     Past Surgical History:  Procedure Laterality Date   Bilateral inguinal hernia repair     BRONCHIAL NEEDLE ASPIRATION BIOPSY  04/05/2021   Procedure: BRONCHIAL NEEDLE ASPIRATION BIOPSIES;  Surgeon: Brenna Adine CROME, DO;  Location: [facility];  Service: Pulmonary;;   COLONOSCOPY  01/23/2012   Procedure: COLONOSCOPY;  Surgeon: Lamar CHRISTELLA Hollingshead, MD;  Location: [facility];  Service: Endoscopy;  Laterality: N/A;  9:30 AM   COLONOSCOPY N/A 10/11/2015   Procedure: COLONOSCOPY;  Surgeon: Lamar CHRISTELLA Hollingshead, MD;  Location: [facility];  Service: Endoscopy;  Laterality: N/A;  830   COLONOSCOPY N/A 10/14/2019   Procedure: COLONOSCOPY;  Surgeon: Hollingshead Lamar CHRISTELLA, MD;  Location: [facility];  Service: Endoscopy;  Laterality: N/A;  1:45   HERNIA REPAIR     POLYPECTOMY  10/14/2019   Procedure: POLYPECTOMY;  Surgeon: Hollingshead Lamar CHRISTELLA, MD;  Location: [facility];  Service: Endoscopy;;  ascending colon, descending colon   VIDEO BRONCHOSCOPY WITH ENDOBRONCHIAL ULTRASOUND N/A 04/05/2021   Procedure: VIDEO BRONCHOSCOPY WITH ENDOBRONCHIAL ULTRASOUND;  Surgeon: Brenna Adine CROME, DO;  Location: [facility];  Service: Pulmonary;  Laterality: N/A;    Family History  Problem Relation Age of Onset   Hypertension Mother    Diabetes Mother    Heart attack Mother    Heart disease Mother    Hypertension Father  Heart attack Father    COPD Father    Heart disease Father    Heart attack Brother    Heart disease Brother    Breast cancer Maternal Aunt    Cancer Maternal Aunt        uterine vs ovarain   Stroke Maternal Grandmother    Stroke Maternal Grandfather    Colon cancer Neg Hx     Social History[1]  Medications: I have reviewed the patient's current medications.  Allergies[2]   ROS:  Pertinent items are noted in HPI.  Blood pressure 135/89, pulse (!) 112, temperature 98.1 F (36.7 C), temperature source Oral, resp.  rate 16, SpO2 99%. Physical Exam Vitals reviewed.  Constitutional:      Appearance: Normal appearance.  HENT:     Head: Normocephalic and atraumatic.  Eyes:     Extraocular Movements: Extraocular movements intact.  Cardiovascular:     Rate and Rhythm: Normal rate.  Pulmonary:     Effort: Pulmonary effort is normal.  Abdominal:     Comments: Abdomen soft, nondistended, no percussion tenderness, nontender to palpation; no rigidity, guarding, rebound tenderness; negative Murphy sign  Musculoskeletal:        General: Normal range of motion.     Cervical back: Normal range of motion.  Skin:    General: Skin is warm and dry.  Neurological:     General: No focal deficit present.     Mental Status: He is alert and oriented to person, place, and time.  Psychiatric:        Mood and Affect: Mood normal.        Behavior: Behavior normal.     Results: Results for orders placed or performed during the hospital encounter of 10/28/24 (from the past 48 hours)  CBC with Differential/Platelet     Status: Abnormal   Collection Time: 10/28/24  6:41 AM  Result Value Ref Range   WBC 8.7 4.0 - 10.5 K/uL   RBC 4.27 4.22 - 5.81 MIL/uL   Hemoglobin 10.0 (L) 13.0 - 17.0 g/dL   HCT 64.8 (L) 60.9 - 47.9 %   MCV 82.2 80.0 - 100.0 fL   MCH 23.4 (L) 26.0 - 34.0 pg   MCHC 28.5 (L) 30.0 - 36.0 g/dL   RDW 77.4 (H) 88.4 - 84.4 %   Platelets 205 150 - 400 K/uL   nRBC 0.5 (H) 0.0 - 0.2 %   Neutrophils Relative % 63 %   Neutro Abs 5.5 1.7 - 7.7 K/uL   Lymphocytes Relative 25 %   Lymphs Abs 2.2 0.7 - 4.0 K/uL   Monocytes Relative 10 %   Monocytes Absolute 0.9 0.1 - 1.0 K/uL   Eosinophils Relative 0 %   Eosinophils Absolute 0.0 0.0 - 0.5 K/uL   Basophils Relative 0 %   Basophils Absolute 0.0 0.0 - 0.1 K/uL   WBC Morphology See Note     Comment: Mild Left Shift (1-5% metas, occ myelo)   Smear Review Normal platelet morphology    Metamyelocytes Relative 2 %   Abs Immature Granulocytes 0.20 (H) 0.00 -  0.07 K/uL   Ovalocytes PRESENT     Comment: Performed at [facility], [address], [place], [place] [address]  Comprehensive metabolic panel with GFR     Status: Abnormal   Collection Time: 10/28/24  6:41 AM  Result Value Ref Range   Sodium 140 135 - 145 mmol/L   Potassium 3.4 (L) 3.5 - 5.1 mmol/L   Chloride 101 98 -  111 mmol/L   CO2 26 22 - 32 mmol/L   Glucose, Bld 127 (H) 70 - 99 mg/dL    Comment: Glucose reference range applies only to samples taken after fasting for at least 8 hours.   BUN 8 8 - 23 mg/dL   Creatinine, Ser 8.95 0.61 - 1.24 mg/dL   Calcium  8.9 8.9 - 10.3 mg/dL   Total Protein 7.2 6.5 - 8.1 g/dL   Albumin 3.6 3.5 - 5.0 g/dL   AST 40 15 - 41 U/L   ALT 41 0 - 44 U/L   Alkaline Phosphatase 100 38 - 126 U/L   Total Bilirubin 0.3 0.0 - 1.2 mg/dL   GFR, Estimated >39 >39 mL/min    Comment: (NOTE) Calculated using the CKD-EPI Creatinine Equation (2021)    Anion gap 13 5 - 15    Comment: Performed at [facility], [address], [place], [place] [address]  Lipase, blood     Status: None   Collection Time: 10/28/24  6:41 AM  Result Value Ref Range   Lipase 20 11 - 51 U/L    Comment: Performed at [facility], [address] [place], [place] [address]  Troponin T, High Sensitivity     Status: Abnormal   Collection Time: 10/28/24  6:41 AM  Result Value Ref Range   Troponin T High Sensitivity 56 (H) 0 - 19 ng/L    Comment: (NOTE) Biotin concentrations > 1000 ng/mL falsely decrease TnT results.  Serial cardiac troponin measurements are suggested.  Refer to the Links section for chest pain algorithms and additional  guidance. Performed at [facility], [address], [place], [place] [address]   Pro Brain natriuretic peptide     Status: None   Collection Time: 10/28/24  6:41 AM  Result Value Ref Range   Pro Brain Natriuretic Peptide 263.0 <300.0 pg/mL    Comment: (NOTE) Age Group        Cut-Points    Interpretation  < 50 years     450 pg/mL       NT-proBNP >  450 pg/mL indicates                                ADHF is likely              50 to 75 years  900 pg/mL      NT-proBNP > 900 pg/mL indicates          ADHF is likely  > 75 years      1800 pg/mL     NT-proBNP > 1800 pg/mL indicates          ADHF is likely                           All ages    Results between       Indeterminate. Further clinical             300 and the cut-   information is needed to determine            point for age group   if ADHF is present.  Elecsys proBNP II/ Elecsys proBNP II STAT           Cut-Point                       Interpretation  300 pg/mL                    NT-proBNP <300pg/mL indicates                             ADHF is not likely  Performed at [facility], [address] [place], [place] [address]   Troponin T, High Sensitivity     Status: Abnormal   Collection Time: 10/28/24  8:21 AM  Result Value Ref Range   Troponin T High Sensitivity 51 (H) 0 - 19 ng/L    Comment: (NOTE) Biotin concentrations > 1000 ng/mL falsely decrease TnT results.  Serial cardiac troponin measurements are suggested.  Refer to the Links section for chest pain algorithms and additional  guidance. Performed at [facility], [address], [place], [place] [address]   Procalcitonin     Status: None   Collection Time: 10/28/24  8:21 AM  Result Value Ref Range   Procalcitonin 0.14 ng/mL    Comment: (NOTE)   Sepsis PCT Algorithm          Lower Respiratory Tract Infection                                         PCT Algorithm -----------------------------------------------------------------  <0.5 ng/mL                    <0.10 ng/mL  Associated with low           Antibiotic therapy strongly   risk for progression          discouraged. Indicates absence   to severe sepsis              of bacteria infection  and/or septic shock             --------------------------------------------------------------  0.5-2.0  ng/mL                 0.10-0.25 ng/mL  Recommended to retest         Antibiotic therapy discouraged.  PCT within 6-24 hours         Bacterial infection unlikely  ------------------------------------------------------------  >2 ng/mL                      0.26-0.50 ng/mL  Associated with high risk     Antibiotic therapy encouraged.  for progression to severe     Bacterial infection possible  sepsis/and or septic shock    ------------------------------                                 >0.50 ng/mL                                Antibiotic therapy strongly                                 encouraged.  Suggestive of presence of                                 bacterial infection.                                 -------------------------------------------------------------------  < or = 0.50 ng/mL OR          < or = 0.25 OR 80% decrease in PCT  80% decrease in PCT           Antibiotic therapy   Antibiotic therapy may        may be discontinued  be discontinued                                 Performed at [facility], [address] [place], [place] [address]    *Note: Due to a large number of results and/or encounters for the requested time period, some results have not been displayed. A complete set of results can be found in Results Review.    US  Abdomen Limited RUQ (LIVER/GB) Result Date: 10/28/2024 CLINICAL DATA:  Right upper quadrant pain. EXAM: ULTRASOUND ABDOMEN LIMITED RIGHT UPPER QUADRANT COMPARISON:  Abdomen and pelvis CT from earlier same day FINDINGS: Gallbladder: Multiple small gallstones evident along the dependent gallbladder mucosa. Gallbladder wall is mildly thickened at 4.2 mm. No sonographic Murphy sign. No substantial pericholecystic fluid. Common bile duct: Diameter: 5 mm Liver: Liver is diffusely echogenic. 8 mm anechoic lesion inferior right liver compatible with a simple cyst. Portal vein is patent on color Doppler imaging with normal  direction of blood flow towards the liver. Other: None. IMPRESSION: 1. Cholelithiasis with mild gallbladder wall thickening. No sonographic Murphy sign or substantial pericholecystic fluid. Given wall thickening, acute cholecystitis is a concern. 2. Echogenic liver parenchyma suggests fatty deposition. Electronically Signed   By: Camellia Candle M.D.   On: 10/28/2024 09:32   CT Angio Chest Pulmonary Embolism (PE) W or WO Contrast Result Date: 10/28/2024 CLINICAL DATA:  Some chest pain and shortness of breath beginning last night. Evaluate for pulmonary embolism. History of lung cancer. Right-sided abdominal pain. EXAM: CT ANGIOGRAPHY CHEST CT ABDOMEN AND PELVIS WITH CONTRAST TECHNIQUE: Multidetector CT imaging of the chest was performed using the standard protocol during bolus administration of intravenous contrast. Multiplanar CT image reconstructions and MIPs were obtained to evaluate the vascular anatomy. Multidetector CT imaging of the abdomen and pelvis was performed using the standard protocol during bolus administration of intravenous contrast. RADIATION DOSE REDUCTION: This exam was performed according to the departmental dose-optimization program which includes automated exposure control, adjustment of the mA and/or kV according to patient size and/or use of iterative reconstruction technique. CONTRAST:  OMNIPAQUE  IOHEXOL  350 MG/ML SOLN COMPARISON:  CT chest, abdomen and pelvis 09/22/2024 and 06/13/2024. Chest CT 04/20/2024. FINDINGS: CTA CHEST FINDINGS Cardiovascular: Heart is normal size with stable moderate pericardial effusion present. Calcified plaque over the left main coronary artery. Thoracic aorta is normal in caliber. Minimal calcified plaque over the descending thoracic aorta. Pulmonary arterial system is well opacified without evidence of emboli. Remaining vascular structures are unremarkable. Mediastinum/Nodes: No significant mediastinal or hilar adenopathy. Remaining mediastinal  structures are unremarkable. Lungs/Pleura: Lungs are adequately inflated. Slight increase in size of dominant nodule over the right upper lobe (image 62) measuring  1.5 x 2 cm this has increased significantly since the prior exam from July 2025. Stable 6 mm nodule over the right upper lobe (image 48) and stable 5 mm nodule over the posterior right upper lobe (image 45). Stable subsolid nodule over the periphery of the left upper lobe (image 70). There is new patchy bilateral nodular airspace opacification which may represent an acute infectious or inflammatory process versus progression of patient's underlying metastatic lung cancer. Small stable bilateral pleural effusions. Airways are unremarkable. Musculoskeletal: Stable well-defined lucent lesion over an upper thoracic vertebral body which is unchanged from 2018 and likely benign. No other focal bony abnormality. Review of the MIP images confirms the above findings. CT ABDOMEN and PELVIS FINDINGS Hepatobiliary: 7 mm hypodensity over the right lobe of the liver unchanged. Stable 3 mm hypodensity over the periphery of the right lobe of the liver. Gallbladder and biliary tree are normal. Pancreas: Normal. Spleen: Normal. Adrenals/Urinary Tract: Adrenal glands are normal. Kidneys are normal in size without hydronephrosis. Stable 9 mm left renal stone. Ureters and bladder are normal. Stomach/Bowel: Stomach is normal. There are a few minimally prominent small bowel loops over the left abdomen without evidence of obstruction. Appendix is normal. Mild diverticulosis of the colon. Vascular/Lymphatic: Mild calcified plaque over the abdominal aorta which is normal caliber. No significant adenopathy. Reproductive: Prostate is unremarkable. Other: No free fluid.  No focal inflammatory change. Musculoskeletal: No focal abnormality. Review of the MIP images confirms the above findings. IMPRESSION: 1. No evidence of pulmonary embolism. 2. New patchy bilateral nodular airspace  opacification which may represent an acute infectious or inflammatory process versus progression of patient's underlying metastatic lung cancer. Consider repeat CT 4-6 weeks post treatment. 3. Slight increase in size of known dominant nodule over the right upper lobe measuring 1.5 x 2 cm. This has increased significantly since the prior exam from July 2025. Other stable subcentimeter nodules as described. 4. Stable moderate pericardial effusion. 5. Stable small bilateral pleural effusions. 6. No acute findings in the abdomen/pelvis. 7. Stable 9 mm left renal stone. 8. Mild colonic diverticulosis. 9. Aortic atherosclerosis. Atherosclerotic coronary artery disease. Aortic Atherosclerosis (ICD10-I70.0). Electronically Signed   By: Toribio Agreste M.D.   On: 10/28/2024 08:12   CT ABDOMEN PELVIS W CONTRAST Result Date: 10/28/2024 CLINICAL DATA:  Some chest pain and shortness of breath beginning last night. Evaluate for pulmonary embolism. History of lung cancer. Right-sided abdominal pain. EXAM: CT ANGIOGRAPHY CHEST CT ABDOMEN AND PELVIS WITH CONTRAST TECHNIQUE: Multidetector CT imaging of the chest was performed using the standard protocol during bolus administration of intravenous contrast. Multiplanar CT image reconstructions and MIPs were obtained to evaluate the vascular anatomy. Multidetector CT imaging of the abdomen and pelvis was performed using the standard protocol during bolus administration of intravenous contrast. RADIATION DOSE REDUCTION: This exam was performed according to the departmental dose-optimization program which includes automated exposure control, adjustment of the mA and/or kV according to patient size and/or use of iterative reconstruction technique. CONTRAST:  OMNIPAQUE  IOHEXOL  350 MG/ML SOLN COMPARISON:  CT chest, abdomen and pelvis 09/22/2024 and 06/13/2024. Chest CT 04/20/2024. FINDINGS: CTA CHEST FINDINGS Cardiovascular: Heart is normal size with stable moderate pericardial  effusion present. Calcified plaque over the left main coronary artery. Thoracic aorta is normal in caliber. Minimal calcified plaque over the descending thoracic aorta. Pulmonary arterial system is well opacified without evidence of emboli. Remaining vascular structures are unremarkable. Mediastinum/Nodes: No significant mediastinal or hilar adenopathy. Remaining mediastinal structures are unremarkable. Lungs/Pleura: Lungs are  adequately inflated. Slight increase in size of dominant nodule over the right upper lobe (image 62) measuring 1.5 x 2 cm this has increased significantly since the prior exam from July 2025. Stable 6 mm nodule over the right upper lobe (image 48) and stable 5 mm nodule over the posterior right upper lobe (image 45). Stable subsolid nodule over the periphery of the left upper lobe (image 70). There is new patchy bilateral nodular airspace opacification which may represent an acute infectious or inflammatory process versus progression of patient's underlying metastatic lung cancer. Small stable bilateral pleural effusions. Airways are unremarkable. Musculoskeletal: Stable well-defined lucent lesion over an upper thoracic vertebral body which is unchanged from 2018 and likely benign. No other focal bony abnormality. Review of the MIP images confirms the above findings. CT ABDOMEN and PELVIS FINDINGS Hepatobiliary: 7 mm hypodensity over the right lobe of the liver unchanged. Stable 3 mm hypodensity over the periphery of the right lobe of the liver. Gallbladder and biliary tree are normal. Pancreas: Normal. Spleen: Normal. Adrenals/Urinary Tract: Adrenal glands are normal. Kidneys are normal in size without hydronephrosis. Stable 9 mm left renal stone. Ureters and bladder are normal. Stomach/Bowel: Stomach is normal. There are a few minimally prominent small bowel loops over the left abdomen without evidence of obstruction. Appendix is normal. Mild diverticulosis of the colon. Vascular/Lymphatic:  Mild calcified plaque over the abdominal aorta which is normal caliber. No significant adenopathy. Reproductive: Prostate is unremarkable. Other: No free fluid.  No focal inflammatory change. Musculoskeletal: No focal abnormality. Review of the MIP images confirms the above findings. IMPRESSION: 1. No evidence of pulmonary embolism. 2. New patchy bilateral nodular airspace opacification which may represent an acute infectious or inflammatory process versus progression of patient's underlying metastatic lung cancer. Consider repeat CT 4-6 weeks post treatment. 3. Slight increase in size of known dominant nodule over the right upper lobe measuring 1.5 x 2 cm. This has increased significantly since the prior exam from July 2025. Other stable subcentimeter nodules as described. 4. Stable moderate pericardial effusion. 5. Stable small bilateral pleural effusions. 6. No acute findings in the abdomen/pelvis. 7. Stable 9 mm left renal stone. 8. Mild colonic diverticulosis. 9. Aortic atherosclerosis. Atherosclerotic coronary artery disease. Aortic Atherosclerosis (ICD10-I70.0). Electronically Signed   By: Toribio Agreste M.D.   On: 10/28/2024 08:12   DG Chest Port 1 View Result Date: 10/28/2024 CLINICAL DATA:  Chest pain.  History of lung cancer. EXAM: PORTABLE CHEST 1 VIEW COMPARISON:  10/02/2024 FINDINGS: The cardio pericardial silhouette is enlarged. Interstitial markings are diffusely coarsened with chronic features. Possible trace bilateral pleural effusions with suggestion of trace basilar atelectasis is well. Right upper lobe pulmonary nodules evident, better characterized on previous CT chest of 09/22/2024. Telemetry leads overlie the chest. IMPRESSION: 1. Chronic interstitial coarsening with possible trace bilateral pleural effusions and trace basilar atelectasis. 2. Right upper lobe pulmonary nodule, better characterized on previous CT chest. Electronically Signed   By: Camellia Candle M.D.   On: 10/28/2024 06:58      Assessment & Plan:  OUMAR MARCOTT is a 69 y.o. male who is admitted with abdominal pain, and concern for possible acute cholecystitis.  Imaging and blood work evaluated by myself.  -We discussed the pathophysiology of gallbladder disease -Given his active chemotherapy treatments, I would recommend against any surgical interventions at this time -Will plan to obtain HIDA scan to evaluate for acute cholecystitis.  If HIDA scan is positive for acute cholecystitis, recommend IR consultation for percutaneous cholecystostomy  tube -NPO until completion of HIDA.  If no plans for HIDA scan today, would recommend giving the patient a low-fat diet.  Would recommend NPO at midnight -Hold any narcotic pain medications prior to HIDA -Okay for antibiotics at this time, especially given patient's immunocompromise state -IV fluids per primary team -Appreciate hospitalist recommendations -Further recommendations to follow imaging  All questions were answered to the satisfaction of the patient and family.  Note: Portions of this report may have been transcribed using voice recognition software. Every effort has been made to ensure accuracy; however, inadvertent computerized transcription errors may still be present.   -- Dorothyann Brittle, DO [facility] [address] [place] [place], [place] [address] [phone] (office)       [1]  Social History Tobacco Use   Smoking status: Every Day    Current packs/day: 0.50    Average packs/day: 0.5 packs/day for 51.0 years (25.5 ttl pk-yrs)    Types: Cigarettes    Start date: 10/30/1973   Smokeless tobacco: Never  Vaping Use   Vaping status: Never Used  Substance Use Topics   Alcohol use: Not Currently    Comment: One drink every 6 months.   Drug use: No  [2] No Known Allergies

## 2024-10-28 NOTE — ED Notes (Signed)
 Pt taken to ct. Stated pain meds are working and feels some better.

## 2024-10-28 NOTE — Progress Notes (Signed)
 Admitted in OBS for chest pain.    10/28/24 1235  TOC Brief Assessment  Insurance and Status Reviewed  Patient has primary care physician Yes  Home environment has been reviewed Home with spouse  Prior level of function: independent  Prior/Current Home Services No current home services  Social Drivers of Health Review SDOH reviewed no interventions necessary  Readmission risk has been reviewed Yes  Transition of care needs no transition of care needs at this time   Inpatient Care Manager (ICM) has reviewed patient and no ICM needs have been identified at this time. We will continue to monitor patient advancement through interdisciplinary progression rounds. If new patient transition needs arise, please place a ICM consult.

## 2024-10-28 NOTE — Plan of Care (Signed)

## 2024-10-28 NOTE — ED Notes (Signed)
 See triage notes. Pt states abd pain radiates up into chest. No obvious shob noted. Non diaphoretic.

## 2024-10-28 NOTE — H&P (Signed)
 " History and Physical    Patient: Bradley Goodman FMW:[id number] DOB: May 27, 1956 DOA: 10/28/2024 DOS: the patient was seen and examined on 10/28/2024 PCP: [name], FNP  Patient coming from: Home  Chief Complaint: No chief complaint on file.  HPI: Bradley Goodman is a 69 year old male with a history of stage IV NSCLC (dx 03/2021), adrenal insufficiency, hypothyroidism, depression, hyperlipidemia presenting with right sided abdominal pain that began on 10/27/2024.  The patient had been in his usual state of health.  After he ate breakfast on 10/27/2024 he began developing some mild right-sided abdominal pain that migrated to his right lower chest.  It was constant in nature but progressively worsened over the day.  He had some nausea without emesis.  He denies fevers, chills, chest pain, vomiting, diarrhea, dysuria, hematuria.  He has no hematochezia or melena.  He did say that he began developing some shortness of breath on the evening of 10/27/2024.  He denies any new medications in the past 2 to 3 weeks. In the ED, the patient was afebrile and hemodynamically stable.  Oxygen saturation was 97% on 2 L.  He was tachycardic in 110s.  WBC 8.7, hemoglobin 10.0, platelet 205.  Sodium 140, potassium 3.4, bicarbonate 26, serum creatinine 1.04.  proBNP 263.  Troponin 56>> 51.  CTA chest was negative for PE.  There is new patchy bilateral nodular airspace opacities.  There was a slight increase in the size of his dominant nodule in the right upper lobe.  There is a stable pericardial effusion.  There are stable bilateral pleural effusions.  CT abdomen and pelvis was negative for any acute findings.  Right upper quadrant ultrasound showed cholelithiasis with mild gallbladder thickening.  There is no pericholecystic fluid.  The patient was started on ceftriaxone  and azithromycin .  Regarding his oncologic history, the patient has been following Dr. Gatha.  He was diagnosed with NSCLC June 2022 when he presented  with right upper lobe lung nodule in addition to right hilar, subcarinal and bilateral mediastinal as well as supraclavicular lymphadenopathy in addition to bone and brain metastasis    The patient underwent SRS to metastatic brain lesion under the care of Dr. Patrcia and he is currently undergoing systemic chemotherapy with carboplatin  for AUC of 5, Alimta  500 Mg/M2 and Keytruda  200 Mg IV every 3 weeks status post 58 cycles  He is currently Currently on cycle 58 of treatment, last treatment 10/20/24. Last CT chest, [facility] showed stable findings with no progression of disease. Last appointment with Dr. Gatha was 09/29/2024.  Review of Systems: As mentioned in the history of present illness. All other systems reviewed and are negative. Past Medical History:  Diagnosis Date   Brain cancer [facility])    Cervical spondylolysis    Depression    Essential hypertension    Family history of breast cancer    GERD (gastroesophageal reflux disease)    History of kidney stones    History of migraine    Hyperlipidemia    Hypertension    lung ca with mets to brain    Lung cancer (HCC)    PONV (postoperative nausea and vomiting)    Type 2 diabetes mellitus (HCC)    Past Surgical History:  Procedure Laterality Date   Bilateral inguinal hernia repair     BRONCHIAL NEEDLE ASPIRATION BIOPSY  04/05/2021   Procedure: BRONCHIAL NEEDLE ASPIRATION BIOPSIES;  Surgeon: Brenna Adine CROME, DO;  Location: [facility];  Service: Pulmonary;;   COLONOSCOPY  01/23/2012  Procedure: COLONOSCOPY;  Surgeon: Lamar CHRISTELLA Hollingshead, MD;  Location: [facility];  Service: Endoscopy;  Laterality: N/A;  9:30 AM   COLONOSCOPY N/A 10/11/2015   Procedure: COLONOSCOPY;  Surgeon: Lamar CHRISTELLA Hollingshead, MD;  Location: [facility];  Service: Endoscopy;  Laterality: N/A;  830   COLONOSCOPY N/A 10/14/2019   Procedure: COLONOSCOPY;  Surgeon: Hollingshead Lamar CHRISTELLA, MD;  Location: [facility];  Service: Endoscopy;  Laterality: N/A;  1:45   HERNIA REPAIR      POLYPECTOMY  10/14/2019   Procedure: POLYPECTOMY;  Surgeon: Hollingshead Lamar CHRISTELLA, MD;  Location: [facility];  Service: Endoscopy;;  ascending colon, descending colon   VIDEO BRONCHOSCOPY WITH ENDOBRONCHIAL ULTRASOUND N/A 04/05/2021   Procedure: VIDEO BRONCHOSCOPY WITH ENDOBRONCHIAL ULTRASOUND;  Surgeon: Brenna Adine CROME, DO;  Location: [facility];  Service: Pulmonary;  Laterality: N/A;   Social History:  reports that he has been smoking cigarettes. He started smoking about 51 years ago. He has a 25.5 pack-year smoking history. He has never used smokeless tobacco. He reports that he does not currently use alcohol. He reports that he does not use drugs.  Allergies[1]  Family History  Problem Relation Age of Onset   Hypertension Mother    Diabetes Mother    Heart attack Mother    Heart disease Mother    Hypertension Father    Heart attack Father    COPD Father    Heart disease Father    Heart attack Brother    Heart disease Brother    Breast cancer Maternal Aunt    Cancer Maternal Aunt        uterine vs ovarain   Stroke Maternal Grandmother    Stroke Maternal Grandfather    Colon cancer Neg Hx     Prior to Admission medications  Medication Sig Start Date End Date Taking? Authorizing Provider  acetaminophen  (TYLENOL ) 500 MG tablet Take 500 mg by mouth every 6 (six) hours as needed for mild pain (pain score 1-3) or fever.    [provider]  albuterol  (VENTOLIN  HFA) 108 (90 Base) MCG/ACT inhaler Inhale 2 puffs into the lungs every 6 (six) hours as needed for wheezing or shortness of breath. 10/04/24   Maree, Pratik D, DO  azithromycin  (ZITHROMAX ) 500 MG tablet Take 1 tablet (500 mg total) by mouth daily for 3 days. 10/27/24 10/30/24  Severa Rock CHRISTELLA, FNP  chlorproMAZINE  (THORAZINE ) 10 MG tablet Take 1 tablet (10 mg total) by mouth 3 (three) times daily as needed for hiccoughs. 11/27/23   Severa Rock CHRISTELLA, FNP  cholecalciferol  (VITAMIN D ) 1000 UNITS tablet Take 1,000 Units by mouth  every evening.    [provider]  colchicine  0.6 MG tablet Take 1 tablet (0.6 mg total) by mouth 2 (two) times daily. 08/11/24   Sheron Lorette GRADE, PA-C  Continuous Glucose Sensor (FREESTYLE LIBRE 3 PLUS SENSOR) MISC Change sensor every 15 days. 09/07/24   Severa Rock CHRISTELLA, FNP  dexamethasone  (DECADRON ) 1 MG tablet Take 1 tablet (1 mg total) by mouth daily with breakfast. 10/15/24   Nida, Gebreselassie W, MD  famotidine  (PEPCID ) 10 MG tablet Take 10 mg by mouth at bedtime. Chewable    [provider]  folic acid  (FOLVITE ) 1 MG tablet Take 1 tablet (1 mg total) by mouth daily. 10/15/24   Severa Rock CHRISTELLA, FNP  Krill Oil 300 MG CAPS Take 300 mg by mouth every evening.    [provider]  levothyroxine  (SYNTHROID ) 112 MCG tablet Take 1  tablet (112 mcg total) by mouth daily before breakfast. 07/15/24   Nida, Gebreselassie W, MD  loratadine  (CLARITIN ) 10 MG tablet Take 10 mg by mouth every evening.     [provider]  mirtazapine  (REMERON ) 15 MG tablet Take 1 tablet (15 mg total) by mouth at bedtime. 10/15/24   [name], FNP  Multiple Vitamins-Minerals (ONE A DAY MEN 50 PLUS) TABS Take 1 tablet by mouth at bedtime.    [provider]  ondansetron  (ZOFRAN ) 4 MG tablet Take 2 tablets (8 mg total) by mouth every 8 (eight) hours as needed for nausea. 07/23/24   [name], FNP  pantoprazole  (PROTONIX ) 40 MG tablet Take 1 tablet (40 mg total) by mouth every evening. 10/12/24   [name], FNP  polyethylene glycol powder (GLYCOLAX /MIRALAX ) 17 GM/SCOOP powder Take 119 g by mouth daily as needed for mild constipation or moderate constipation.    [provider]  rosuvastatin  (CRESTOR ) 20 MG tablet Take 1 tablet (20 mg total) by mouth daily. 10/12/24   [name], [name], FNP  SYRINGE-NEEDLE, DISP, 3 ML (B-D 3CC LUER-LOK SYR 21GX1-1/2) 21G X 1-1/2 3 ML MISC Use to inject testosterone  every week 03/10/24   Nida, Gebreselassie W, MD  testosterone  cypionate  (DEPOTESTOTERONE CYPIONATE) 100 MG/ML injection Take 50mg  ( 0.5ml) and 100mg  ( 1ml) into muscle alternatively weekly. 07/15/24   Nida, Gebreselassie W, MD  venlafaxine  XR (EFFEXOR -XR) 75 MG 24 hr capsule Take 2 capsules (150 mg total) by mouth at bedtime. 10/27/24   [name], FNP    Physical Exam: Vitals:   10/28/24 0830 10/28/24 0900 10/28/24 1050 10/28/24 1100  BP: (!) 149/93 (!) 140/94 136/85 127/89  Pulse: (!) 112 (!) 114 (!) 110 (!) 109  Resp: 17 (!) 22 (!) 21 20  Temp:      TempSrc:      SpO2: 96% 96% 96% 97%   GENERAL:  A&O x 3, NAD, well developed, cooperative, follows commands HEENT: [place]/AT, No thrush, No icterus, No oral ulcers Neck:  No neck mass, No meningismus, soft, supple CV: RRR, no S3, no S4, no rub, no JVD Lungs: Bibasilar crackles.  No wheezes Abd: soft/NT +BS, nondistended Ext: No edema, no lymphangitis, no cyanosis, no rashes Neuro:  CN II-XII intact, strength 4/5 in RUE, RLE, strength 4/5 LUE, LLE; sensation intact bilateral; no dysmetria; babinski equivocal  Data Reviewed: Data reviewed above in the history  Assessment and Plan: Lobar pneumonia - 10/28/2024 CTA chest as discussed above - Check PCT - MRSA screen - Continue ceftriaxone  and azithromycin   Right-sided abdominal pain -10/28/2024 CT abdomen and pelvis negative for acute findings - 10/28/2024 RUQ US  with mild gallbladder wall thickening but no cholecystic fluid - Plan for HIDA scan  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   Essential hypertension -Holding losartan  -BP remains well controlled -follow up PCP for BP check   Mixed hyperlipidemia -Continue statin   Stage 4 NSCLC - Last chemotherapeutic treatment 10/20/2024--cycle 58 -Follow-up Dr. Gatha   Depression -Continue Effexor    Steroid-induced diabetes -9/7 hemoglobin A1c 6.8 -NovoLog  sliding scale  Hypothyroidism -Continue synthroid    Advance Care Planning: FULL  Consults: general surgery  Family Communication: wife 10/28/24  Severity of Illness: The appropriate patient status for this patient is OBSERVATION. Observation status is judged to be reasonable and necessary in order to provide the required intensity of service to ensure the patient's safety. The patient's presenting symptoms, physical exam findings, and initial radiographic and laboratory data in the context of their medical condition is felt to place them at decreased risk for further clinical deterioration. Furthermore, it is anticipated that the patient will be medically stable for discharge from the hospital within 2 midnights of admission.   Author: Alm Schneider, MD 10/28/2024 11:32 AM  For on call review [email or web address].     [1] No Known Allergies  "

## 2024-10-28 NOTE — ED Provider Notes (Signed)
 " [place] EMERGENCY DEPARTMENT AT [facility] Provider Note   CSN: [id number] Arrival date & time: 10/28/24  9379     Patient presents with: No chief complaint on file.   Bradley Goodman is a 69 y.o. male.   Presents to the emergency department for evaluation of right sided pain.  Patient reports that pain began overnight.  He had a doctor's appointment yesterday, felt fine during that appointment.  Tonight he developed pain and distention of the abdomen, pain is central and right-sided.  He has some radiation into the chest area with mild shortness of breath.  No vomiting, has been having normal bowel movements.       Prior to Admission medications  Medication Sig Start Date End Date Taking? Authorizing Provider  acetaminophen  (TYLENOL ) 500 MG tablet Take 500 mg by mouth every 6 (six) hours as needed for mild pain (pain score 1-3) or fever.    [provider]  albuterol  (VENTOLIN  HFA) 108 (90 Base) MCG/ACT inhaler Inhale 2 puffs into the lungs every 6 (six) hours as needed for wheezing or shortness of breath. 10/04/24   Maree, Pratik D, DO  azithromycin  (ZITHROMAX ) 500 MG tablet Take 1 tablet (500 mg total) by mouth daily for 3 days. 10/27/24 10/30/24  [name], FNP  chlorproMAZINE  (THORAZINE ) 10 MG tablet Take 1 tablet (10 mg total) by mouth 3 (three) times daily as needed for hiccoughs. 11/27/23   [name], FNP  cholecalciferol  (VITAMIN D ) 1000 UNITS tablet Take 1,000 Units by mouth every evening.    [provider]  colchicine  0.6 MG tablet Take 1 tablet (0.6 mg total) by mouth 2 (two) times daily. 08/11/24   Sheron Lorette GRADE, PA-C  Continuous Glucose Sensor (FREESTYLE LIBRE 3 PLUS SENSOR) MISC Change sensor every 15 days. 09/07/24   [name], FNP  dexamethasone  (DECADRON ) 1 MG tablet Take 1 tablet (1 mg total) by mouth daily with breakfast. 10/15/24   Nida, Gebreselassie W, MD  famotidine  (PEPCID ) 10 MG tablet Take 10 mg by mouth at bedtime.  Chewable    [provider]  folic acid  (FOLVITE ) 1 MG tablet Take 1 tablet (1 mg total) by mouth daily. 10/15/24   [name], FNP  Krill Oil 300 MG CAPS Take 300 mg by mouth every evening.    [provider]  levothyroxine  (SYNTHROID ) 112 MCG tablet Take 1 tablet (112 mcg total) by mouth daily before breakfast. 07/15/24   Nida, Gebreselassie W, MD  loratadine  (CLARITIN ) 10 MG tablet Take 10 mg by mouth every evening.     [provider]  mirtazapine  (REMERON ) 15 MG tablet Take 1 tablet (15 mg total) by mouth at bedtime. 10/15/24   [name], FNP  Multiple Vitamins-Minerals (ONE A DAY MEN 50 PLUS) TABS Take 1 tablet by mouth at bedtime.    [provider]  ondansetron  (ZOFRAN ) 4 MG tablet Take 2 tablets (8 mg total) by mouth every 8 (eight) hours as needed for nausea. 07/23/24   [name], FNP  pantoprazole  (PROTONIX ) 40 MG tablet Take 1 tablet (40 mg total) by mouth every evening. 10/12/24   [name], FNP  polyethylene glycol powder (GLYCOLAX /MIRALAX ) 17 GM/SCOOP powder Take 119 g by mouth daily as needed for mild constipation or moderate constipation.    [provider]  rosuvastatin  (CRESTOR ) 20 MG tablet Take 1 tablet (20 mg total) by mouth daily. 10/12/24   [name], FNP  SYRINGE-NEEDLE,  DISP, 3 ML (B-D 3CC LUER-LOK SYR 21GX1-1/2) 21G X 1-1/2 3 ML MISC Use to inject testosterone  every week 03/10/24   Nida, Gebreselassie W, MD  testosterone  cypionate (DEPOTESTOTERONE CYPIONATE) 100 MG/ML injection Take 50mg  ( 0.53ml) and 100mg  ( 1ml) into muscle alternatively weekly. 07/15/24   Nida, Gebreselassie W, MD  venlafaxine  XR (EFFEXOR -XR) 75 MG 24 hr capsule Take 2 capsules (150 mg total) by mouth at bedtime. 10/27/24   [name], FNP    Allergies: Patient has no known allergies.    Review of Systems  Respiratory:  Positive for shortness of breath.   Cardiovascular:  Positive for chest pain.  Gastrointestinal:  Positive for abdominal  distention and abdominal pain.    Updated Vital Signs BP (!) 130/90 (BP Location: Right Arm)   Temp 98.7 F (37.1 C) (Oral)   Resp (!) 24   Physical Exam Vitals and nursing note reviewed.  Constitutional:      General: He is not in acute distress.    Appearance: He is well-developed.  HENT:     Head: Normocephalic and atraumatic.     Mouth/Throat:     Mouth: Mucous membranes are moist.  Eyes:     General: Vision grossly intact. Gaze aligned appropriately.     Extraocular Movements: Extraocular movements intact.     Conjunctiva/sclera: Conjunctivae normal.  Cardiovascular:     Rate and Rhythm: Normal rate and regular rhythm.     Pulses: Normal pulses.     Heart sounds: Normal heart sounds, S1 normal and S2 normal. No murmur heard.    No friction rub. No gallop.  Pulmonary:     Effort: Pulmonary effort is normal. No respiratory distress.     Breath sounds: Normal breath sounds.  Abdominal:     General: Bowel sounds are decreased. There is distension.     Palpations: Abdomen is soft.     Tenderness: There is generalized abdominal tenderness. There is no guarding or rebound.     Hernia: No hernia is present.  Musculoskeletal:        General: No swelling.     Cervical back: Full passive range of motion without pain, normal range of motion and neck supple. No pain with movement, spinous process tenderness or muscular tenderness. Normal range of motion.     Right lower leg: No edema.     Left lower leg: No edema.  Skin:    General: Skin is warm and dry.     Capillary Refill: Capillary refill takes less than 2 seconds.     Findings: No ecchymosis, erythema, lesion or wound.  Neurological:     Mental Status: He is alert and oriented to person, place, and time.     GCS: GCS eye subscore is 4. GCS verbal subscore is 5. GCS motor subscore is 6.     Cranial Nerves: Cranial nerves 2-12 are intact.     Sensory: Sensation is intact.     Motor: Motor function is intact. No weakness or  abnormal muscle tone.     Coordination: Coordination is intact.  Psychiatric:        Mood and Affect: Mood normal.        Speech: Speech normal.        Behavior: Behavior normal.     (all labs ordered are listed, but only abnormal results are displayed) Labs Reviewed  CBC WITH DIFFERENTIAL/PLATELET  COMPREHENSIVE METABOLIC PANEL WITH GFR  LIPASE, BLOOD  PRO BRAIN NATRIURETIC PEPTIDE  TROPONIN T, HIGH  SENSITIVITY    EKG: None  Radiology: No results found.   Procedures   Medications Ordered in the ED - No data to display                                  Medical Decision Making Amount and/or Complexity of Data Reviewed External Data Reviewed: labs, radiology, ECG and notes. Labs: ordered. Decision-making details documented in ED Course. Radiology: ordered.   Differential Diagnosis considered includes, but not limited to: Cholelithiasis; cholecystitis; cholangitis; bowel obstruction; esophagitis; gastritis; peptic ulcer disease; pancreatitis; cardiac.   Presents to the emergency department for evaluation of primarily abdominal pain with some chest discomfort.  Patient complains of distention, feels like when he swallows anything it gets caught.  No vomiting.  Has been having normal bowel movements.  Patient currently getting systemic palliative treatment for stage IV adenocarcinoma of the lung.  Exam reveals some distention, decreased bowel sounds.  There is mild diffuse tenderness, no peritonitis.  Cardiac evaluation, abdominal labs ordered.  Will signout oncoming ER physician, will need abdominal imaging after labs are available.     Final diagnoses:  Generalized abdominal pain    ED Discharge Orders     None          Haze Lonni PARAS, MD 10/28/24 [id number]  "

## 2024-10-28 NOTE — ED Triage Notes (Addendum)
 Pt c/o abdominal pain on right side and some chest pain and SOB that started last night. Pt has lung cancer.

## 2024-10-29 ENCOUNTER — Encounter (HOSPITAL_COMMUNITY): Payer: Self-pay | Admitting: Internal Medicine

## 2024-10-29 ENCOUNTER — Other Ambulatory Visit (HOSPITAL_COMMUNITY): Payer: Self-pay

## 2024-10-29 ENCOUNTER — Observation Stay (HOSPITAL_COMMUNITY)

## 2024-10-29 DIAGNOSIS — R1084 Generalized abdominal pain: Secondary | ICD-10-CM | POA: Diagnosis not present

## 2024-10-29 DIAGNOSIS — K811 Chronic cholecystitis: Secondary | ICD-10-CM | POA: Diagnosis not present

## 2024-10-29 DIAGNOSIS — C3491 Malignant neoplasm of unspecified part of right bronchus or lung: Secondary | ICD-10-CM | POA: Diagnosis not present

## 2024-10-29 DIAGNOSIS — J181 Lobar pneumonia, unspecified organism: Secondary | ICD-10-CM | POA: Diagnosis not present

## 2024-10-29 LAB — CBC
HCT: 31.6 % — ABNORMAL LOW (ref 39.0–52.0)
Hemoglobin: 8.9 g/dL — ABNORMAL LOW (ref 13.0–17.0)
MCH: 23.5 pg — ABNORMAL LOW (ref 26.0–34.0)
MCHC: 28.2 g/dL — ABNORMAL LOW (ref 30.0–36.0)
MCV: 83.4 fL (ref 80.0–100.0)
Platelets: 192 K/uL (ref 150–400)
RBC: 3.79 MIL/uL — ABNORMAL LOW (ref 4.22–5.81)
RDW: 22.5 % — ABNORMAL HIGH (ref 11.5–15.5)
WBC: 9.3 K/uL (ref 4.0–10.5)
nRBC: 0.5 % — ABNORMAL HIGH (ref 0.0–0.2)

## 2024-10-29 MED ORDER — MORPHINE SULFATE (PF) 4 MG/ML IV SOLN
3.0000 mg | Freq: Once | INTRAVENOUS | Status: AC
  Administered 2024-10-29: 3 mg via INTRAVENOUS
  Filled 2024-10-29: qty 1

## 2024-10-29 MED ORDER — TECHNETIUM TC 99M MEBROFENIN IV KIT
5.0000 | PACK | Freq: Once | INTRAVENOUS | Status: AC | PRN
  Administered 2024-10-29: 5.4 via INTRAVENOUS

## 2024-10-29 MED ORDER — CEFDINIR 300 MG PO CAPS
300.0000 mg | ORAL_CAPSULE | Freq: Two times a day (BID) | ORAL | 0 refills | Status: AC
  Filled 2024-10-29: qty 10, 5d supply, fill #0

## 2024-10-29 MED ORDER — DOXYCYCLINE HYCLATE 100 MG PO TABS
100.0000 mg | ORAL_TABLET | Freq: Two times a day (BID) | ORAL | 0 refills | Status: AC
  Filled 2024-10-29: qty 10, 5d supply, fill #0

## 2024-10-29 NOTE — Progress Notes (Signed)
 [facility] Progress Note     Subjective: Patient seen and examined.  He is resting comfortably in bed.  He tolerated his diet last night without nausea and vomiting.  He denies any further abdominal pain at this time.  Denies nausea and vomiting.  Objective: Vital signs in last 24 hours: Temp:  [97.8 F (36.6 C)-99.9 F (37.7 C)] 97.8 F (36.6 C) (01/22 0924) Pulse Rate:  [102-120] 110 (01/22 0924) Resp:  [20] 20 (01/22 0522) BP: (125-152)/(90-101) 150/101 (01/22 0924) SpO2:  [93 %-100 %] 93 % (01/22 0924) Weight:  [83 kg] 83 kg (01/21 2201) Last BM Date : 10/29/24  Intake/Output from previous day: 01/21 0701 - 01/22 0700 In: 1486 [P.O.:680; I.V.:806] Out: -  Intake/Output this shift: No intake/output data recorded.  General appearance: alert, cooperative, and no distress GI: Abdomen soft, nondistended, no percussion tenderness, minimal right upper quadrant tenderness to palpation; no rigidity, guarding, rebound tenderness  Lab Results:  Recent Labs    10/28/24 0641 10/29/24 0422  WBC 8.7 9.3  HGB 10.0* 8.9*  HCT 35.1* 31.6*  PLT 205 192   BMET Recent Labs    10/28/24 0641  NA 140  K 3.4*  CL 101  CO2 26  GLUCOSE 127*  BUN 8  CREATININE 1.04  CALCIUM  8.9   PT/INR No results for input(s): LABPROT, INR in the last 72 hours.  Studies/Results: NM Hepato W/EF Result Date: 10/29/2024 EXAM: NM HEPATOBILLARY SCAN 10/29/2024 09:23:49 AM TECHNIQUE: RADIOPHARMACEUTICAL: 5.4 mCi Tc-17m mebrofenin  Dynamic images of the abdomen and pelvis were obtained in the anterior projection for 1 hour after intravenous administration of radiopharmaceutical. Morphine  was administered intravenously and imaging continued for at least 30 minutes. COMPARISON: None available. CLINICAL HISTORY: Cholelithiasis. Cholelithiasis. FINDINGS: Homogenous uptake within the liver. Normal clearance of the blood pool. Appropriate excretion into the biliary system. Radiotracer  activity is evident in the small bowel by 20 minutes. The gallbladder fails to fill at 60 minutes. Following intravenous administration of 3 mg of morphine , the gallbladder fills. IMPRESSION: 1. Delayed gallbladder filling after morphine  challenge, potential chronic cholecystitis. 2. Patent cystic duct and common bile duct. Electronically signed by: Norleen Boxer MD 10/29/2024 09:34 AM EST RP Workstation: [id number]   US  Abdomen Limited RUQ (LIVER/GB) Result Date: 10/28/2024 CLINICAL DATA:  Right upper quadrant pain. EXAM: ULTRASOUND ABDOMEN LIMITED RIGHT UPPER QUADRANT COMPARISON:  Abdomen and pelvis CT from earlier same day FINDINGS: Gallbladder: Multiple small gallstones evident along the dependent gallbladder mucosa. Gallbladder wall is mildly thickened at 4.2 mm. No sonographic Murphy sign. No substantial pericholecystic fluid. Common bile duct: Diameter: 5 mm Liver: Liver is diffusely echogenic. 8 mm anechoic lesion inferior right liver compatible with a simple cyst. Portal vein is patent on color Doppler imaging with normal direction of blood flow towards the liver. Other: None. IMPRESSION: 1. Cholelithiasis with mild gallbladder wall thickening. No sonographic Murphy sign or substantial pericholecystic fluid. Given wall thickening, acute cholecystitis is a concern. 2. Echogenic liver parenchyma suggests fatty deposition. Electronically Signed   By: Camellia Candle M.D.   On: 10/28/2024 09:32   CT Angio Chest Pulmonary Embolism (PE) W or WO Contrast Result Date: 10/28/2024 CLINICAL DATA:  Some chest pain and shortness of breath beginning last night. Evaluate for pulmonary embolism. History of lung cancer. Right-sided abdominal pain. EXAM: CT ANGIOGRAPHY CHEST CT ABDOMEN AND PELVIS WITH CONTRAST TECHNIQUE: Multidetector CT imaging of the chest was performed using the standard protocol during bolus administration of intravenous contrast. Multiplanar CT image reconstructions  and MIPs were obtained to evaluate  the vascular anatomy. Multidetector CT imaging of the abdomen and pelvis was performed using the standard protocol during bolus administration of intravenous contrast. RADIATION DOSE REDUCTION: This exam was performed according to the departmental dose-optimization program which includes automated exposure control, adjustment of the mA and/or kV according to patient size and/or use of iterative reconstruction technique. CONTRAST:  OMNIPAQUE  IOHEXOL  350 MG/ML SOLN COMPARISON:  CT chest, abdomen and pelvis 09/22/2024 and 06/13/2024. Chest CT 04/20/2024. FINDINGS: CTA CHEST FINDINGS Cardiovascular: Heart is normal size with stable moderate pericardial effusion present. Calcified plaque over the left main coronary artery. Thoracic aorta is normal in caliber. Minimal calcified plaque over the descending thoracic aorta. Pulmonary arterial system is well opacified without evidence of emboli. Remaining vascular structures are unremarkable. Mediastinum/Nodes: No significant mediastinal or hilar adenopathy. Remaining mediastinal structures are unremarkable. Lungs/Pleura: Lungs are adequately inflated. Slight increase in size of dominant nodule over the right upper lobe (image 62) measuring 1.5 x 2 cm this has increased significantly since the prior exam from July 2025. Stable 6 mm nodule over the right upper lobe (image 48) and stable 5 mm nodule over the posterior right upper lobe (image 45). Stable subsolid nodule over the periphery of the left upper lobe (image 70). There is new patchy bilateral nodular airspace opacification which may represent an acute infectious or inflammatory process versus progression of patient's underlying metastatic lung cancer. Small stable bilateral pleural effusions. Airways are unremarkable. Musculoskeletal: Stable well-defined lucent lesion over an upper thoracic vertebral body which is unchanged from 2018 and likely benign. No other focal bony abnormality. Review of the MIP images  confirms the above findings. CT ABDOMEN and PELVIS FINDINGS Hepatobiliary: 7 mm hypodensity over the right lobe of the liver unchanged. Stable 3 mm hypodensity over the periphery of the right lobe of the liver. Gallbladder and biliary tree are normal. Pancreas: Normal. Spleen: Normal. Adrenals/Urinary Tract: Adrenal glands are normal. Kidneys are normal in size without hydronephrosis. Stable 9 mm left renal stone. Ureters and bladder are normal. Stomach/Bowel: Stomach is normal. There are a few minimally prominent small bowel loops over the left abdomen without evidence of obstruction. Appendix is normal. Mild diverticulosis of the colon. Vascular/Lymphatic: Mild calcified plaque over the abdominal aorta which is normal caliber. No significant adenopathy. Reproductive: Prostate is unremarkable. Other: No free fluid.  No focal inflammatory change. Musculoskeletal: No focal abnormality. Review of the MIP images confirms the above findings. IMPRESSION: 1. No evidence of pulmonary embolism. 2. New patchy bilateral nodular airspace opacification which may represent an acute infectious or inflammatory process versus progression of patient's underlying metastatic lung cancer. Consider repeat CT 4-6 weeks post treatment. 3. Slight increase in size of known dominant nodule over the right upper lobe measuring 1.5 x 2 cm. This has increased significantly since the prior exam from July 2025. Other stable subcentimeter nodules as described. 4. Stable moderate pericardial effusion. 5. Stable small bilateral pleural effusions. 6. No acute findings in the abdomen/pelvis. 7. Stable 9 mm left renal stone. 8. Mild colonic diverticulosis. 9. Aortic atherosclerosis. Atherosclerotic coronary artery disease. Aortic Atherosclerosis (ICD10-I70.0). Electronically Signed   By: Toribio Agreste M.D.   On: 10/28/2024 08:12   CT ABDOMEN PELVIS W CONTRAST Result Date: 10/28/2024 CLINICAL DATA:  Some chest pain and shortness of breath beginning  last night. Evaluate for pulmonary embolism. History of lung cancer. Right-sided abdominal pain. EXAM: CT ANGIOGRAPHY CHEST CT ABDOMEN AND PELVIS WITH CONTRAST TECHNIQUE: Multidetector CT imaging of  the chest was performed using the standard protocol during bolus administration of intravenous contrast. Multiplanar CT image reconstructions and MIPs were obtained to evaluate the vascular anatomy. Multidetector CT imaging of the abdomen and pelvis was performed using the standard protocol during bolus administration of intravenous contrast. RADIATION DOSE REDUCTION: This exam was performed according to the departmental dose-optimization program which includes automated exposure control, adjustment of the mA and/or kV according to patient size and/or use of iterative reconstruction technique. CONTRAST:  OMNIPAQUE  IOHEXOL  350 MG/ML SOLN COMPARISON:  CT chest, abdomen and pelvis 09/22/2024 and 06/13/2024. Chest CT 04/20/2024. FINDINGS: CTA CHEST FINDINGS Cardiovascular: Heart is normal size with stable moderate pericardial effusion present. Calcified plaque over the left main coronary artery. Thoracic aorta is normal in caliber. Minimal calcified plaque over the descending thoracic aorta. Pulmonary arterial system is well opacified without evidence of emboli. Remaining vascular structures are unremarkable. Mediastinum/Nodes: No significant mediastinal or hilar adenopathy. Remaining mediastinal structures are unremarkable. Lungs/Pleura: Lungs are adequately inflated. Slight increase in size of dominant nodule over the right upper lobe (image 62) measuring 1.5 x 2 cm this has increased significantly since the prior exam from July 2025. Stable 6 mm nodule over the right upper lobe (image 48) and stable 5 mm nodule over the posterior right upper lobe (image 45). Stable subsolid nodule over the periphery of the left upper lobe (image 70). There is new patchy bilateral nodular airspace opacification which may represent an  acute infectious or inflammatory process versus progression of patient's underlying metastatic lung cancer. Small stable bilateral pleural effusions. Airways are unremarkable. Musculoskeletal: Stable well-defined lucent lesion over an upper thoracic vertebral body which is unchanged from 2018 and likely benign. No other focal bony abnormality. Review of the MIP images confirms the above findings. CT ABDOMEN and PELVIS FINDINGS Hepatobiliary: 7 mm hypodensity over the right lobe of the liver unchanged. Stable 3 mm hypodensity over the periphery of the right lobe of the liver. Gallbladder and biliary tree are normal. Pancreas: Normal. Spleen: Normal. Adrenals/Urinary Tract: Adrenal glands are normal. Kidneys are normal in size without hydronephrosis. Stable 9 mm left renal stone. Ureters and bladder are normal. Stomach/Bowel: Stomach is normal. There are a few minimally prominent small bowel loops over the left abdomen without evidence of obstruction. Appendix is normal. Mild diverticulosis of the colon. Vascular/Lymphatic: Mild calcified plaque over the abdominal aorta which is normal caliber. No significant adenopathy. Reproductive: Prostate is unremarkable. Other: No free fluid.  No focal inflammatory change. Musculoskeletal: No focal abnormality. Review of the MIP images confirms the above findings. IMPRESSION: 1. No evidence of pulmonary embolism. 2. New patchy bilateral nodular airspace opacification which may represent an acute infectious or inflammatory process versus progression of patient's underlying metastatic lung cancer. Consider repeat CT 4-6 weeks post treatment. 3. Slight increase in size of known dominant nodule over the right upper lobe measuring 1.5 x 2 cm. This has increased significantly since the prior exam from July 2025. Other stable subcentimeter nodules as described. 4. Stable moderate pericardial effusion. 5. Stable small bilateral pleural effusions. 6. No acute findings in the  abdomen/pelvis. 7. Stable 9 mm left renal stone. 8. Mild colonic diverticulosis. 9. Aortic atherosclerosis. Atherosclerotic coronary artery disease. Aortic Atherosclerosis (ICD10-I70.0). Electronically Signed   By: Toribio Agreste M.D.   On: 10/28/2024 08:12   DG Chest Port 1 View Result Date: 10/28/2024 CLINICAL DATA:  Chest pain.  History of lung cancer. EXAM: PORTABLE CHEST 1 VIEW COMPARISON:  10/02/2024 FINDINGS: The cardio pericardial  silhouette is enlarged. Interstitial markings are diffusely coarsened with chronic features. Possible trace bilateral pleural effusions with suggestion of trace basilar atelectasis is well. Right upper lobe pulmonary nodules evident, better characterized on previous CT chest of 09/22/2024. Telemetry leads overlie the chest. IMPRESSION: 1. Chronic interstitial coarsening with possible trace bilateral pleural effusions and trace basilar atelectasis. 2. Right upper lobe pulmonary nodule, better characterized on previous CT chest. Electronically Signed   By: Camellia Candle M.D.   On: 10/28/2024 06:58    Anti-infectives: Anti-infectives (From admission, onward)    Start     Dose/Rate Route Frequency Ordered Stop   10/29/24 1000  doxycycline  (VIBRA -TABS) tablet 100 mg        100 mg Oral Every 12 hours 10/28/24 1207     10/29/24 0800  cefTRIAXone  (ROCEPHIN ) 2 g in sodium chloride  0.9 % 100 mL IVPB        2 g 200 mL/hr over 30 Minutes Intravenous Every 24 hours 10/28/24 1207     10/28/24 0845  cefTRIAXone  (ROCEPHIN ) 2 g in sodium chloride  0.9 % 100 mL IVPB        2 g 200 mL/hr over 30 Minutes Intravenous  Once 10/28/24 0838 10/28/24 0950   10/28/24 0845  azithromycin  (ZITHROMAX ) tablet 500 mg        500 mg Oral  Once 10/28/24 9161 10/28/24 0919       Assessment/Plan:  Patient is a 69 year old male who was admitted with abdominal pain and concern for possible acute cholecystitis.  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  I would avoid percutaneous cholecystostomy tube at this time given improvement of his symptoms -If patient tolerates his diet without nausea and vomiting or worsening abdominal pain, patient is stable for discharge home from a general surgery standpoint -Advised that he should avoid fatty foods to decrease risk of further episodes -Also discussed with the patient that if he has a break in his chemotherapy for a couple of months, could discuss outpatient cholecystectomy -Case discussed with Dr. Evonnie, appreciate his recommendations   LOS: 0 days    Rehan Holness A Chanay Nugent 10/29/2024  Note: Portions of this report may have been transcribed using voice recognition software. Every effort has been made to ensure accuracy; however, inadvertent computerized transcription errors may still be present.

## 2024-10-29 NOTE — Plan of Care (Signed)

## 2024-10-29 NOTE — Discharge Summary (Signed)
 " Physician Discharge Summary   Patient: Bradley Goodman MRN: [id number] DOB: 1956/03/15  Admit date:     10/28/2024  Discharge date: 10/29/24  Discharge Physician: Alm Skyelynn Rambeau   PCP: [name], FNP   Recommendations at discharge:   Please follow up with primary care provider within 1-2 weeks  Please repeat BMP and CBC in one week   Hospital Course: 69 year old male with a history of stage IV NSCLC (dx 03/2021), adrenal insufficiency, hypothyroidism, depression, hyperlipidemia presenting with right sided abdominal pain that began on 10/27/2024.  The patient had been in his usual state of health.  After he ate breakfast on 10/27/2024 he began developing some mild right-sided abdominal pain that migrated to his right lower chest.  It was constant in nature but progressively worsened over the day.  He had some nausea without emesis.  He denies fevers, chills, chest pain, vomiting, diarrhea, dysuria, hematuria.  He has no hematochezia or melena.  He did say that he began developing some shortness of breath on the evening of 10/27/2024.  He denies any new medications in the past 2 to 3 weeks. In the ED, the patient was afebrile and hemodynamically stable.  Oxygen saturation was 97% on 2 L.  He was tachycardic in 110s.  WBC 8.7, hemoglobin 10.0, platelet 205.  Sodium 140, potassium 3.4, bicarbonate 26, serum creatinine 1.04.  proBNP 263.  Troponin 56>> 51.  CTA chest was negative for PE.  There is new patchy bilateral nodular airspace opacities.  There was a slight increase in the size of his dominant nodule in the right upper lobe.  There is a stable pericardial effusion.  There are stable bilateral pleural effusions.  CT abdomen and pelvis was negative for any acute findings.  Right upper quadrant ultrasound showed cholelithiasis with mild gallbladder thickening.  There is no pericholecystic fluid.  The patient was started on ceftriaxone  and doxy.  Regarding his oncologic history, the patient has been  following Dr. Gatha.  He was diagnosed with NSCLC June 2022 when he presented with right upper lobe lung nodule in addition to right hilar, subcarinal and bilateral mediastinal as well as supraclavicular lymphadenopathy in addition to bone and brain metastasis    The patient underwent SRS (finished 05/04/21) to metastatic brain lesion under the care of Dr. Patrcia and he is currently undergoing systemic chemotherapy with carboplatin  for AUC of 5, Alimta  500 Mg/M2 and Keytruda  200 Mg IV every 3 weeks status post 58 cycles  He is currently Currently on cycle 58 of treatment, last treatment 10/20/24. Last CT chest, AP showed stable findings with no progression of disease. Last appointment with Dr. Gatha was 09/29/2024.  HIDA scan was ordered.  Cystic duct and common bile duct are patent.  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  General surgery was consulted and recommended advancing the patient's diet.  The patient tolerated his carbohydrate modified diet without abdominal pain.  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   Essential hypertension -Holding losartan  -BP remains well controlled -follow up PCP for BP check   Mixed hyperlipidemia -Continue statin   Stage 4 NSCLC - Last chemotherapeutic treatment 10/20/2024--cycle 58 -Follow-up Dr. Gatha   Depression -Continue Effexor    Steroid-induced diabetes -9/7 hemoglobin A1c 6.8 -NovoLog  sliding scale   Hypothyroidism -Continue synthroid       Consultants: general surgery Procedures performed: none  Disposition: Home Diet recommendation:  Carb modified diet DISCHARGE MEDICATION: Allergies as of 10/29/2024   No Known Allergies      Medication List     STOP taking these medications    azithromycin  500 MG tablet Commonly known as: Zithromax        TAKE these medications    acetaminophen  500 MG tablet Commonly known as: TYLENOL  Take 500 mg by mouth every 6 (six) hours as needed for mild pain (pain score 1-3) or fever.   albuterol  108 (90 Base) MCG/ACT inhaler Commonly known as: VENTOLIN  HFA Inhale 2 puffs into the lungs every 6 (six) hours as needed for wheezing or shortness of breath.   B-D 3CC LUER-LOK SYR 21GX1-1/2 21G X 1-1/2 3 ML Misc Generic drug: SYRINGE-NEEDLE (DISP) 3 ML Use to inject testosterone  every week   cefdinir  300 MG capsule Commonly known as: OMNICEF  Take 1 capsule (300 mg total) by mouth 2 (two) times daily. Start taking on: October 30, 2024   chlorproMAZINE  10 MG tablet Commonly known as: THORAZINE  Take 1 tablet (10 mg total) by mouth 3 (three) times daily as needed for hiccoughs.   cholecalciferol  1000 units tablet Commonly known as: VITAMIN D  Take 1,000 Units by mouth every evening.   colchicine  0.6 MG tablet Take 1 tablet (0.6 mg total) by mouth 2 (two) times daily.   dexamethasone  1 MG  tablet Commonly known as: DECADRON  Take 1 tablet (1 mg total) by mouth daily with breakfast.   doxycycline  100 MG tablet Commonly known as: VIBRA -TABS Take 1 tablet (100 mg total) by mouth every 12 (twelve) hours.   famotidine  10 MG tablet Commonly known as: PEPCID  Take 10 mg by mouth at bedtime. Chewable   folic acid  1 MG tablet Commonly known as: FOLVITE  Take 1 tablet (1 mg total) by mouth daily.   FreeStyle Libre 3 Plus Sensor Misc Change sensor every 15 days.   Krill Oil 300 MG Caps Take 300 mg by mouth every evening.   levothyroxine  112 MCG tablet Commonly known as: SYNTHROID  Take 1 tablet (112 mcg total) by mouth daily before breakfast.   loratadine  10 MG tablet Commonly known as: CLARITIN  Take 10 mg by mouth every evening.   mirtazapine  15 MG tablet Commonly known as: REMERON  Take 1 tablet (15 mg total) by mouth at bedtime.   ondansetron  4 MG tablet Commonly known as: ZOFRAN  Take 2 tablets (8 mg total) by mouth every 8 (eight) hours as needed for nausea. What changed: how much to take   One A Day Men 50 Plus Tabs Take 1 tablet by mouth at bedtime.   pantoprazole  40 MG tablet Commonly known as: PROTONIX  Take 1 tablet (40 mg total)  by mouth every evening.   polyethylene glycol powder 17 GM/SCOOP powder Commonly known as: GLYCOLAX /MIRALAX  Take 119 g by mouth daily as needed for mild constipation or moderate constipation.   rosuvastatin  20 MG tablet Commonly known as: CRESTOR  Take 1 tablet (20 mg total) by mouth daily.   testosterone  cypionate 100 MG/ML injection Commonly known as: DEPOTESTOTERONE CYPIONATE Take 50mg  ( 0.72ml) and 100mg  ( 1ml) into muscle alternatively weekly.   venlafaxine  XR 75 MG 24 hr capsule Commonly known as: EFFEXOR -XR Take 2 capsules (150 mg total) by mouth at bedtime.        Discharge Exam: Filed Weights   10/28/24 2201  Weight: 83 kg   HEENT:  [place]/AT, No thrush, no icterus CV:  RRR, no rub, no S3, no S4 Lung:   bibasilar crackles. No wheeze Abd:  soft/+BS, NT Ext:  No edema, no lymphangitis, no synovitis, no rash   Condition at discharge: stable  The results of significant diagnostics from this hospitalization (including imaging, microbiology, ancillary and laboratory) are listed below for reference.   Imaging Studies: NM Hepato W/EF Result Date: 10/29/2024 EXAM: NM HEPATOBILLARY SCAN 10/29/2024 09:23:49 AM TECHNIQUE: RADIOPHARMACEUTICAL: 5.4 mCi Tc-22m mebrofenin  Dynamic images of the abdomen and pelvis were obtained in the anterior projection for 1 hour after intravenous administration of radiopharmaceutical. Morphine  was administered intravenously and imaging continued for at least 30 minutes. COMPARISON: None available. CLINICAL HISTORY: Cholelithiasis. Cholelithiasis. FINDINGS: Homogenous uptake within the liver. Normal clearance of the blood pool. Appropriate excretion into the biliary system. Radiotracer activity is evident in the small bowel by 20 minutes. The gallbladder fails to fill at 60 minutes. Following intravenous administration of 3 mg of morphine , the gallbladder fills. IMPRESSION: 1. Delayed gallbladder filling after morphine  challenge, potential chronic cholecystitis. 2. Patent cystic duct and common bile duct. Electronically signed by: Norleen Boxer MD 10/29/2024 09:34 AM EST RP Workstation: [id number]   US  Abdomen Limited RUQ (LIVER/GB) Result Date: 10/28/2024 CLINICAL DATA:  Right upper quadrant pain. EXAM: ULTRASOUND ABDOMEN LIMITED RIGHT UPPER QUADRANT COMPARISON:  Abdomen and pelvis CT from earlier same day FINDINGS: Gallbladder: Multiple small gallstones evident along the dependent gallbladder mucosa. Gallbladder wall is mildly thickened at 4.2 mm. No sonographic Murphy sign. No substantial pericholecystic fluid. Common bile duct: Diameter: 5 mm Liver: Liver is diffusely echogenic. 8 mm anechoic lesion inferior right liver compatible with a simple cyst. Portal vein is patent on color  Doppler imaging with normal direction of blood flow towards the liver. Other: None. IMPRESSION: 1. Cholelithiasis with mild gallbladder wall thickening. No sonographic Murphy sign or substantial pericholecystic fluid. Given wall thickening, acute cholecystitis is a concern. 2. Echogenic liver parenchyma suggests fatty deposition. Electronically Signed   By: Camellia Candle M.D.   On: 10/28/2024 09:32   CT Angio Chest Pulmonary Embolism (PE) W or WO Contrast Result Date: 10/28/2024 CLINICAL DATA:  Some chest pain and shortness of breath beginning last night. Evaluate for pulmonary embolism. History of lung cancer. Right-sided abdominal pain. EXAM: CT ANGIOGRAPHY CHEST CT ABDOMEN AND PELVIS WITH CONTRAST TECHNIQUE: Multidetector CT imaging of the chest was performed using the standard protocol during bolus administration of intravenous contrast. Multiplanar CT image reconstructions and MIPs were obtained to evaluate the vascular anatomy. Multidetector CT imaging of the abdomen and pelvis was performed using the standard protocol during bolus administration of intravenous contrast. RADIATION DOSE REDUCTION: This exam was performed according to the departmental dose-optimization program which includes automated exposure control, adjustment of the mA and/or kV according to patient  size and/or use of iterative reconstruction technique. CONTRAST:  OMNIPAQUE  IOHEXOL  350 MG/ML SOLN COMPARISON:  CT chest, abdomen and pelvis 09/22/2024 and 06/13/2024. Chest CT 04/20/2024. FINDINGS: CTA CHEST FINDINGS Cardiovascular: Heart is normal size with stable moderate pericardial effusion present. Calcified plaque over the left main coronary artery. Thoracic aorta is normal in caliber. Minimal calcified plaque over the descending thoracic aorta. Pulmonary arterial system is well opacified without evidence of emboli. Remaining vascular structures are unremarkable. Mediastinum/Nodes: No significant mediastinal or hilar adenopathy.  Remaining mediastinal structures are unremarkable. Lungs/Pleura: Lungs are adequately inflated. Slight increase in size of dominant nodule over the right upper lobe (image 62) measuring 1.5 x 2 cm this has increased significantly since the prior exam from July 2025. Stable 6 mm nodule over the right upper lobe (image 48) and stable 5 mm nodule over the posterior right upper lobe (image 45). Stable subsolid nodule over the periphery of the left upper lobe (image 70). There is new patchy bilateral nodular airspace opacification which may represent an acute infectious or inflammatory process versus progression of patient's underlying metastatic lung cancer. Small stable bilateral pleural effusions. Airways are unremarkable. Musculoskeletal: Stable well-defined lucent lesion over an upper thoracic vertebral body which is unchanged from 2018 and likely benign. No other focal bony abnormality. Review of the MIP images confirms the above findings. CT ABDOMEN and PELVIS FINDINGS Hepatobiliary: 7 mm hypodensity over the right lobe of the liver unchanged. Stable 3 mm hypodensity over the periphery of the right lobe of the liver. Gallbladder and biliary tree are normal. Pancreas: Normal. Spleen: Normal. Adrenals/Urinary Tract: Adrenal glands are normal. Kidneys are normal in size without hydronephrosis. Stable 9 mm left renal stone. Ureters and bladder are normal. Stomach/Bowel: Stomach is normal. There are a few minimally prominent small bowel loops over the left abdomen without evidence of obstruction. Appendix is normal. Mild diverticulosis of the colon. Vascular/Lymphatic: Mild calcified plaque over the abdominal aorta which is normal caliber. No significant adenopathy. Reproductive: Prostate is unremarkable. Other: No free fluid.  No focal inflammatory change. Musculoskeletal: No focal abnormality. Review of the MIP images confirms the above findings. IMPRESSION: 1. No evidence of pulmonary embolism. 2. New patchy  bilateral nodular airspace opacification which may represent an acute infectious or inflammatory process versus progression of patient's underlying metastatic lung cancer. Consider repeat CT 4-6 weeks post treatment. 3. Slight increase in size of known dominant nodule over the right upper lobe measuring 1.5 x 2 cm. This has increased significantly since the prior exam from July 2025. Other stable subcentimeter nodules as described. 4. Stable moderate pericardial effusion. 5. Stable small bilateral pleural effusions. 6. No acute findings in the abdomen/pelvis. 7. Stable 9 mm left renal stone. 8. Mild colonic diverticulosis. 9. Aortic atherosclerosis. Atherosclerotic coronary artery disease. Aortic Atherosclerosis (ICD10-I70.0). Electronically Signed   By: Toribio Agreste M.D.   On: 10/28/2024 08:12   CT ABDOMEN PELVIS W CONTRAST Result Date: 10/28/2024 CLINICAL DATA:  Some chest pain and shortness of breath beginning last night. Evaluate for pulmonary embolism. History of lung cancer. Right-sided abdominal pain. EXAM: CT ANGIOGRAPHY CHEST CT ABDOMEN AND PELVIS WITH CONTRAST TECHNIQUE: Multidetector CT imaging of the chest was performed using the standard protocol during bolus administration of intravenous contrast. Multiplanar CT image reconstructions and MIPs were obtained to evaluate the vascular anatomy. Multidetector CT imaging of the abdomen and pelvis was performed using the standard protocol during bolus administration of intravenous contrast. RADIATION DOSE REDUCTION: This exam was performed according to the  departmental dose-optimization program which includes automated exposure control, adjustment of the mA and/or kV according to patient size and/or use of iterative reconstruction technique. CONTRAST:  OMNIPAQUE  IOHEXOL  350 MG/ML SOLN COMPARISON:  CT chest, abdomen and pelvis 09/22/2024 and 06/13/2024. Chest CT 04/20/2024. FINDINGS: CTA CHEST FINDINGS Cardiovascular: Heart is normal size with stable  moderate pericardial effusion present. Calcified plaque over the left main coronary artery. Thoracic aorta is normal in caliber. Minimal calcified plaque over the descending thoracic aorta. Pulmonary arterial system is well opacified without evidence of emboli. Remaining vascular structures are unremarkable. Mediastinum/Nodes: No significant mediastinal or hilar adenopathy. Remaining mediastinal structures are unremarkable. Lungs/Pleura: Lungs are adequately inflated. Slight increase in size of dominant nodule over the right upper lobe (image 62) measuring 1.5 x 2 cm this has increased significantly since the prior exam from July 2025. Stable 6 mm nodule over the right upper lobe (image 48) and stable 5 mm nodule over the posterior right upper lobe (image 45). Stable subsolid nodule over the periphery of the left upper lobe (image 70). There is new patchy bilateral nodular airspace opacification which may represent an acute infectious or inflammatory process versus progression of patient's underlying metastatic lung cancer. Small stable bilateral pleural effusions. Airways are unremarkable. Musculoskeletal: Stable well-defined lucent lesion over an upper thoracic vertebral body which is unchanged from 2018 and likely benign. No other focal bony abnormality. Review of the MIP images confirms the above findings. CT ABDOMEN and PELVIS FINDINGS Hepatobiliary: 7 mm hypodensity over the right lobe of the liver unchanged. Stable 3 mm hypodensity over the periphery of the right lobe of the liver. Gallbladder and biliary tree are normal. Pancreas: Normal. Spleen: Normal. Adrenals/Urinary Tract: Adrenal glands are normal. Kidneys are normal in size without hydronephrosis. Stable 9 mm left renal stone. Ureters and bladder are normal. Stomach/Bowel: Stomach is normal. There are a few minimally prominent small bowel loops over the left abdomen without evidence of obstruction. Appendix is normal. Mild diverticulosis of the colon.  Vascular/Lymphatic: Mild calcified plaque over the abdominal aorta which is normal caliber. No significant adenopathy. Reproductive: Prostate is unremarkable. Other: No free fluid.  No focal inflammatory change. Musculoskeletal: No focal abnormality. Review of the MIP images confirms the above findings. IMPRESSION: 1. No evidence of pulmonary embolism. 2. New patchy bilateral nodular airspace opacification which may represent an acute infectious or inflammatory process versus progression of patient's underlying metastatic lung cancer. Consider repeat CT 4-6 weeks post treatment. 3. Slight increase in size of known dominant nodule over the right upper lobe measuring 1.5 x 2 cm. This has increased significantly since the prior exam from July 2025. Other stable subcentimeter nodules as described. 4. Stable moderate pericardial effusion. 5. Stable small bilateral pleural effusions. 6. No acute findings in the abdomen/pelvis. 7. Stable 9 mm left renal stone. 8. Mild colonic diverticulosis. 9. Aortic atherosclerosis. Atherosclerotic coronary artery disease. Aortic Atherosclerosis (ICD10-I70.0). Electronically Signed   By: Toribio Agreste M.D.   On: 10/28/2024 08:12   DG Chest Port 1 View Result Date: 10/28/2024 CLINICAL DATA:  Chest pain.  History of lung cancer. EXAM: PORTABLE CHEST 1 VIEW COMPARISON:  10/02/2024 FINDINGS: The cardio pericardial silhouette is enlarged. Interstitial markings are diffusely coarsened with chronic features. Possible trace bilateral pleural effusions with suggestion of trace basilar atelectasis is well. Right upper lobe pulmonary nodules evident, better characterized on previous CT chest of 09/22/2024. Telemetry leads overlie the chest. IMPRESSION: 1. Chronic interstitial coarsening with possible trace bilateral pleural effusions and trace basilar  atelectasis. 2. Right upper lobe pulmonary nodule, better characterized on previous CT chest. Electronically Signed   By: Camellia Candle M.D.   On:  10/28/2024 06:58   DG Chest Portable 1 View Result Date: 10/02/2024 EXAM: 1 VIEW(S) XRAY OF THE CHEST 10/02/2024 05:02:00 PM COMPARISON: 07/25/2024 CLINICAL HISTORY: sob FINDINGS: LUNGS AND PLEURA: There is some increased central interstitial markings. Small nodular densities in the right upper lobe are unchanged from prior CT. There is a stable small right pleural effusion. The left costophrenic angle has been excluded. No pneumothorax. HEART AND MEDIASTINUM: No acute abnormality of the cardiac and mediastinal silhouettes. BONES AND SOFT TISSUES: No acute osseous abnormality. IMPRESSION: 1. Stable small right pleural effusion. 2. Increased central interstitial markings. 3. Stable small nodular densities in the right upper lobe. Electronically signed by: Greig Pique MD 10/02/2024 06:18 PM EST RP Workstation: [id number]    Microbiology: Results for orders placed or performed during the hospital encounter of 10/28/24  Blood culture (routine x 2)     Status: None (Preliminary result)   Collection Time: 10/28/24  9:11 AM   Specimen: BLOOD  Result Value Ref Range Status   Specimen Description BLOOD BLOOD RIGHT ARM  Final   Special Requests   Final    Blood Culture adequate volume BOTTLES DRAWN AEROBIC AND ANAEROBIC   Culture   Final    NO GROWTH < 24 HOURS Performed at [facility], [address], [place], [place] [address]    Report Status PENDING  Incomplete  Blood culture (routine x 2)     Status: None (Preliminary result)   Collection Time: 10/28/24  9:19 AM   Specimen: BLOOD  Result Value Ref Range Status   Specimen Description BLOOD BLOOD RIGHT ARM  Final   Special Requests   Final    BOTTLES DRAWN AEROBIC AND ANAEROBIC Blood Culture adequate volume   Culture   Final    NO GROWTH < 24 HOURS Performed at [facility], [address] [place], [place] [address]    Report Status PENDING  Incomplete   *Note: Due to a large number of results and/or encounters for the requested time  period, some results have not been displayed. A complete set of results can be found in Results Review.    Labs: CBC: Recent Labs  Lab 10/28/24 0641 10/29/24 0422  WBC 8.7 9.3  NEUTROABS 5.5  --   HGB 10.0* 8.9*  HCT 35.1* 31.6*  MCV 82.2 83.4  PLT 205 192   Basic Metabolic Panel: Recent Labs  Lab 10/28/24 0641  NA 140  K 3.4*  CL 101  CO2 26  GLUCOSE 127*  BUN 8  CREATININE 1.04  CALCIUM  8.9   Liver Function Tests: Recent Labs  Lab 10/28/24 0641  AST 40  ALT 41  ALKPHOS 100  BILITOT 0.3  PROT 7.2  ALBUMIN 3.6   CBG: No results for input(s): GLUCAP in the last 168 hours.  Discharge time spent: greater than 30 minutes.  Signed: Alm Schneider, MD [facility] 10/29/2024 "

## 2024-11-02 ENCOUNTER — Telehealth: Payer: Self-pay

## 2024-11-02 LAB — CULTURE, BLOOD (ROUTINE X 2)
Culture: NO GROWTH
Culture: NO GROWTH
Special Requests: ADEQUATE
Special Requests: ADEQUATE

## 2024-11-02 NOTE — Transitions of Care (Post Inpatient/ED Visit) (Signed)
 "  11/02/2024  Name: Bradley Goodman MRN: [id number] DOB: 1956/01/14  Today's TOC FU Call Status: Today's TOC FU Call Status:: Successful TOC FU Call Completed TOC FU Call Complete Date: 11/02/24  Patient's Name and Date of Birth confirmed. DOB, Name  Transition Care Management Follow-up Telephone Call Date of Discharge: 10/29/24 [facility] Type of Discharge: Inpatient Admission Primary Inpatient Discharge Diagnosis:: Lobar Pneumonia How have you been since you were released from the hospital?: Better Any questions or concerns?: No  Items Reviewed: Did you receive and understand the discharge instructions provided?: Yes Medications obtained,verified, and reconciled?: Yes (Medications Reviewed) Any new allergies since your discharge?: No Dietary orders reviewed?: NA Do you have support at home?: Yes People in Home [RPT]: spouse  Medications Reviewed Today: Medications Reviewed Today     Reviewed by Lavelle Charmaine NOVAK, LPN (Licensed Practical Nurse) on 11/02/24 at 1627  Med List Status: <None>   Medication Order Taking? Sig Documenting Provider Last Dose Status Informant  acetaminophen  (TYLENOL ) 500 MG tablet [id number] No Take 500 mg by mouth every 6 (six) hours as needed for mild pain (pain score 1-3) or fever. [provider] Unknown Active Spouse/Significant Other, Self, Pharmacy Records, Multiple Informants  albuterol  (VENTOLIN  HFA) 108 (90 Base) MCG/ACT inhaler [id number] No Inhale 2 puffs into the lungs every 6 (six) hours as needed for wheezing or shortness of breath. Maree Bracken D, DO Unknown Active Self, Spouse/Significant Other, Pharmacy Records, Multiple Informants  cefdinir  (OMNICEF ) 300 MG capsule [id number]  Take 1 capsule (300 mg total) by mouth 2 (two) times daily. Evonnie Lenis, MD  Active   chlorproMAZINE  (THORAZINE ) 10 MG tablet [id number] No Take 1 tablet (10 mg total) by mouth 3 (three) times daily as needed for hiccoughs. [name], FNP 10/27/2024 Noon Active Spouse/Significant Other, Self, Pharmacy Records, Multiple Informants  cholecalciferol  (VITAMIN D ) 1000 UNITS tablet [id number] No Take 1,000 Units by mouth every evening. [provider] Past Week Active Spouse/Significant Other, Self, Pharmacy Records, Multiple Informants  colchicine  0.6 MG tablet [id number] No Take 1 tablet (0.6 mg total) by mouth 2 (two) times daily. Sheron Lorette GRADE, PA-C 10/27/2024 Morning Active Self, Spouse/Significant Other, Pharmacy Records, Multiple Informants  Continuous Glucose Sensor (FREESTYLE LIBRE 3 PLUS SENSOR) MISC [id number]  Change sensor every 15 days. [name], FNP  Active Self, Spouse/Significant Other, Pharmacy Records, Multiple Informants  dexamethasone  (DECADRON ) 1 MG tablet [id number] No Take 1 tablet (1 mg total) by mouth daily with breakfast. Nida, Gebreselassie W, MD 10/27/2024 Morning Active Self, Spouse/Significant Other, Pharmacy Records, Multiple Informants  doxycycline  (VIBRA -TABS) 100 MG tablet [id number]  Take 1 tablet (100 mg total) by mouth every 12 (twelve) hours. Evonnie Lenis, MD  Active   famotidine  (PEPCID ) 10 MG tablet [id number] No Take 10 mg by mouth at bedtime. Chewable [provider] Past Week Active Self, Spouse/Significant Other, Pharmacy Records, Multiple Informants  folic acid  (FOLVITE ) 1 MG tablet [id number] No Take 1 tablet (1 mg total) by mouth daily. [name], FNP 10/27/2024 Evening Active Self, Spouse/Significant Other, Pharmacy Records, Multiple Informants  Krill Oil 300 MG CAPS [id number] No Take 300 mg by mouth every evening. [provider] Past Week Active Spouse/Significant Other, Self, Pharmacy Records, Multiple Informants  levothyroxine  (SYNTHROID ) 112 MCG tablet [id number] No Take 1 tablet (112 mcg total) by mouth daily before breakfast. Nida, Gebreselassie W, MD 10/27/2024 Morning Active Self, Spouse/Significant Other, Pharmacy Records, Multiple Informants  loratadine   (CLARITIN ) 10 MG tablet [id number] No  Take 10 mg by mouth every evening.  [provider] 10/27/2024 Evening Active Spouse/Significant Other, Self, Pharmacy Records, Multiple Informants  mirtazapine  (REMERON ) 15 MG tablet [id number] No Take 1 tablet (15 mg total) by mouth at bedtime. [name], [name], FNP Past Week Active Self, Spouse/Significant Other, Pharmacy Records, Multiple Informants  Multiple Vitamins-Minerals (ONE A DAY MEN 50 PLUS) TABS [id number] No Take 1 tablet by mouth at bedtime. [provider] Past Week Active Self, Spouse/Significant Other, Pharmacy Records, Multiple Informants  ondansetron  (ZOFRAN ) 4 MG tablet [id number] No Take 2 tablets (8 mg total) by mouth every 8 (eight) hours as needed for nausea.  Patient taking differently: Take 4-8 mg by mouth every 8 (eight) hours as needed for nausea.   [name], FNP 10/27/2024 Morning Active Self, Spouse/Significant Other, Pharmacy Records, Multiple Informants  pantoprazole  (PROTONIX ) 40 MG tablet [id number] No Take 1 tablet (40 mg total) by mouth every evening. [name], FNP Past Week Active Self, Spouse/Significant Other, Pharmacy Records, Multiple Informants  polyethylene glycol powder (GLYCOLAX /MIRALAX ) 17 GM/SCOOP powder [id number] No Take 119 g by mouth daily as needed for mild constipation or moderate constipation. [provider] Unknown Active Self, Spouse/Significant Other, Pharmacy Records, Multiple Informants           Med Note DARILYN, DAWN S   Wed Oct 28, 2024  1:31 PM)    rosuvastatin  (CRESTOR ) 20 MG tablet [id number] No Take 1 tablet (20 mg total) by mouth daily. [name], [name], FNP Past Week Active Self, Spouse/Significant Other, Pharmacy Records, Multiple Informants  SYRINGE-NEEDLE, DISP, 3 ML (B-D 3CC LUER-LOK SYR 21GX1-1/2) 21G X 1-1/2 3 ML MISC [id number]  Use to inject testosterone  every week Nida, Gebreselassie W, MD  Active Self, Spouse/Significant Other, Pharmacy Records, Multiple  Informants  testosterone  cypionate (DEPOTESTOTERONE CYPIONATE) 100 MG/ML injection [id number] No Take 50mg  ( 0.49ml) and 100mg  ( 1ml) into muscle alternatively weekly. Nida, Gebreselassie W, MD 10/19/2024 Active Self, Spouse/Significant Other, Pharmacy Records, Multiple Informants  venlafaxine  XR (EFFEXOR -XR) 75 MG 24 hr capsule [id number] No Take 2 capsules (150 mg total) by mouth at bedtime. [name], FNP Past Week Active Self, Spouse/Significant Other, Pharmacy Records, Multiple Informants            Home Care and Equipment/Supplies: Were Home Health Services Ordered?: NA Any new equipment or medical supplies ordered?: NA  Functional Questionnaire: Do you need assistance with bathing/showering or dressing?: No Do you need assistance with meal preparation?: No Do you need assistance with eating?: No Do you have difficulty maintaining continence: No Do you need assistance with getting out of bed/getting out of a chair/moving?: No Do you have difficulty managing or taking your medications?: No  Follow up appointments reviewed: PCP Follow-up appointment confirmed?: (S) No (patient with multiple oncology appt. can labs be ordered and done at next oncology appt?) Specialist Hospital Follow-up appointment confirmed?: Yes Date of Specialist follow-up appointment?: 11/10/24 Follow-Up Specialty Provider:: Oncology Do you need transportation to your follow-up appointment?: No Do you understand care options if your condition(s) worsen?: Yes-patient verbalized understanding    SIGNATURE Charmaine Bloodgood, LPN [facility] Health Advisor [facility] You Are. We Are. [facility] Direct Dial [phone]  "

## 2024-11-03 ENCOUNTER — Encounter: Payer: Self-pay | Admitting: Internal Medicine

## 2024-11-03 ENCOUNTER — Other Ambulatory Visit: Payer: Self-pay

## 2024-11-03 ENCOUNTER — Other Ambulatory Visit (HOSPITAL_COMMUNITY): Payer: Self-pay

## 2024-11-06 ENCOUNTER — Other Ambulatory Visit: Payer: Self-pay

## 2024-11-06 NOTE — Progress Notes (Unsigned)
 [facility] OFFICE PROGRESS NOTE  [name], FNP [address] [place] [address]  DIAGNOSIS: Stage IV (T1b, N3, M1C) non-small cell lung cancer, favoring adenocarcinoma presented with right upper lobe lung nodule in addition to right hilar, subcarinal and bilateral mediastinal as well as supraclavicular lymphadenopathy in addition to bone and brain metastasis diagnosed in June 2022.   PD-L1 expression 80%.     Molecular Studies:  Biomarker Findings Microsatellite status - MS-Stable Tumor Mutational Burden - 6 Muts/Mb Genomic Findings For a complete list of the genes assayed, please refer to the Appendix. KRAS G12C, amplification ATM S470* CCND1 amplification - equivocal HGF amplification - equivocal MYC amplification - equivocal FGF19 amplification - equivocal FGF3 amplification - equivocal FGF4 amplification - equivocal NFKBIA amplification NKX2-1 amplification RAD21 amplification - equivocal [id number]*26 TERT promoter -124C>T TP53 rearrangement exon 9 7 Disease relevant genes with no reportable alterations: ALK, BRAF, EGFR, ERBB2, MET, RET, ROS1   PRIOR THERAPY:  SRS to the metastatic brain lesions under the care of Dr. Patrcia.  Last treatment on 05/04/2021.   CURRENT THERAPY: Palliative systemic chemotherapy with carboplatin  for an AUC 5, Alimta  500 mg/m2 and, Keytruda  200 mg IV every 3 weeks.  First dose expected on 05/08/2021.  Status post 59 cycles.  Starting from cycle #5 he is on maintenance treatment with Alimta  and Keytruda  every 3 weeks.    INTERVAL HISTORY: Bradley Goodman 69 y.o. male returns to the clinic today for a follow-up visit.  He was last seen in the clinic 3 weeks ago.  In the interval since being seen he did present to the hospital.  He presented with right sided abdominal pain which was negative for acute finding. There was some question about potential chronic cholecystitis. His symptoms have improved.   He has  a history of pericardial effusion, which was noted to be moderate in amount on his recent echocardiogram. He is under regular monitoring for this condition. He has a follow up scheduled for February 2026.   He is currently tolerating chemotherapy without new or concerning symptoms. He denies abdominal pain, fever, chills, night sweats, respiratory changes, nausea, or vomiting. Bowel function is stable, with only mild, intermittent constipation consistent with his baseline and not requiring intervention. He is adhering to dietary modifications, specifically avoiding fatty foods, and reports overall well-being.  Recent imaging included CT scans of the chest and abdomen during hospitalization; a brain MRI is scheduled but was delayed due to logistical issues. His next brain MRI is on 11/22/24. He continues to take testosterone  as prescribed, with the most recent dose administered today. He is here today for evaluation and repeat blood work before undergoing cycle #60.    MEDICAL HISTORY: Past Medical History:  Diagnosis Date   Brain cancer [facility])    Cervical spondylolysis    Depression    Essential hypertension    Family history of breast cancer    GERD (gastroesophageal reflux disease)    History of kidney stones    History of migraine    Hyperlipidemia    Hypertension    lung ca with mets to brain    Lung cancer (HCC)    PONV (postoperative nausea and vomiting)    Type 2 diabetes mellitus (HCC)     ALLERGIES:  has no known allergies.  MEDICATIONS:  Current Outpatient Medications  Medication Sig Dispense Refill   acetaminophen  (TYLENOL ) 500 MG tablet Take 500 mg by mouth every 6 (six) hours as needed for mild  pain (pain score 1-3) or fever.     albuterol  (VENTOLIN  HFA) 108 (90 Base) MCG/ACT inhaler Inhale 2 puffs into the lungs every 6 (six) hours as needed for wheezing or shortness of breath. 8 g 2   cefdinir  (OMNICEF ) 300 MG capsule Take 1 capsule (300 mg total) by mouth 2 (two) times  daily. 10 capsule 0   chlorproMAZINE  (THORAZINE ) 10 MG tablet Take 1 tablet (10 mg total) by mouth 3 (three) times daily as needed for hiccoughs. 90 tablet 0   cholecalciferol  (VITAMIN D ) 1000 UNITS tablet Take 1,000 Units by mouth every evening.     colchicine  0.6 MG tablet Take 1 tablet (0.6 mg total) by mouth 2 (two) times daily. 180 tablet 0   Continuous Glucose Sensor (FREESTYLE LIBRE 3 PLUS SENSOR) MISC Change sensor every 15 days. 6 each 3   dexamethasone  (DECADRON ) 1 MG tablet Take 1 tablet (1 mg total) by mouth daily with breakfast. 90 tablet 1   doxycycline  (VIBRA -TABS) 100 MG tablet Take 1 tablet (100 mg total) by mouth every 12 (twelve) hours. 10 tablet 0   famotidine  (PEPCID ) 10 MG tablet Take 10 mg by mouth at bedtime. Chewable     folic acid  (FOLVITE ) 1 MG tablet Take 1 tablet (1 mg total) by mouth daily. 90 tablet 0   Krill Oil 300 MG CAPS Take 300 mg by mouth every evening.     levothyroxine  (SYNTHROID ) 112 MCG tablet Take 1 tablet (112 mcg total) by mouth daily before breakfast. 90 tablet 1   loratadine  (CLARITIN ) 10 MG tablet Take 10 mg by mouth every evening.      mirtazapine  (REMERON ) 15 MG tablet Take 1 tablet (15 mg total) by mouth at bedtime. 90 tablet 0   Multiple Vitamins-Minerals (ONE A DAY MEN 50 PLUS) TABS Take 1 tablet by mouth at bedtime.     ondansetron  (ZOFRAN ) 4 MG tablet Take 2 tablets (8 mg total) by mouth every 8 (eight) hours as needed for nausea. (Patient taking differently: Take 4-8 mg by mouth every 8 (eight) hours as needed for nausea.) 60 tablet 0   pantoprazole  (PROTONIX ) 40 MG tablet Take 1 tablet (40 mg total) by mouth every evening. 90 tablet 0   polyethylene glycol powder (GLYCOLAX /MIRALAX ) 17 GM/SCOOP powder Take 119 g by mouth daily as needed for mild constipation or moderate constipation.     rosuvastatin  (CRESTOR ) 20 MG tablet Take 1 tablet (20 mg total) by mouth daily. 90 tablet 0   SYRINGE-NEEDLE, DISP, 3 ML (B-D 3CC LUER-LOK SYR 21GX1-1/2) 21G  X 1-1/2 3 ML MISC Use to inject testosterone  every week 100 each 2   testosterone  cypionate (DEPOTESTOTERONE CYPIONATE) 100 MG/ML injection Take 50mg  ( 0.48ml) and 100mg  ( 1ml) into muscle alternatively weekly. 10 mL 1   venlafaxine  XR (EFFEXOR -XR) 75 MG 24 hr capsule Take 2 capsules (150 mg total) by mouth at bedtime. 180 capsule 1   No current facility-administered medications for this visit.    SURGICAL HISTORY:  Past Surgical History:  Procedure Laterality Date   Bilateral inguinal hernia repair     BRONCHIAL NEEDLE ASPIRATION BIOPSY  04/05/2021   Procedure: BRONCHIAL NEEDLE ASPIRATION BIOPSIES;  Surgeon: Brenna Adine CROME, DO;  Location: [facility];  Service: Pulmonary;;   COLONOSCOPY  01/23/2012   Procedure: COLONOSCOPY;  Surgeon: Lamar CHRISTELLA Hollingshead, MD;  Location: [facility];  Service: Endoscopy;  Laterality: N/A;  9:30 AM   COLONOSCOPY N/A 10/11/2015   Procedure: COLONOSCOPY;  Surgeon: Lamar CHRISTELLA Hollingshead,  MD;  Location: [facility];  Service: Endoscopy;  Laterality: N/A;  830   COLONOSCOPY N/A 10/14/2019   Procedure: COLONOSCOPY;  Surgeon: Shaaron Lamar HERO, MD;  Location: [facility];  Service: Endoscopy;  Laterality: N/A;  1:45   HERNIA REPAIR     POLYPECTOMY  10/14/2019   Procedure: POLYPECTOMY;  Surgeon: Shaaron Lamar HERO, MD;  Location: [facility];  Service: Endoscopy;;  ascending colon, descending colon   VIDEO BRONCHOSCOPY WITH ENDOBRONCHIAL ULTRASOUND N/A 04/05/2021   Procedure: VIDEO BRONCHOSCOPY WITH ENDOBRONCHIAL ULTRASOUND;  Surgeon: Brenna Adine CROME, DO;  Location: [facility];  Service: Pulmonary;  Laterality: N/A;    REVIEW OF SYSTEMS:   Review of Systems  Constitutional: Positive for fatigue. Negative for appetite change, chills, fever and unexpected weight change.  HENT: Negative for mouth sores, nosebleeds, sore throat and trouble swallowing.   Eyes: Negative for eye problems and icterus.  Respiratory: Negative for cough, hemoptysis, shortness of breath and  wheezing.   Cardiovascular: Negative for chest pain. Positive for intermittent swelling.  Gastrointestinal: Occasional baseline constipation. Negative for abdominal pain, constipation, diarrhea, nausea and vomiting.  Genitourinary: Negative for bladder incontinence, difficulty urinating, dysuria, frequency and hematuria.   Musculoskeletal: Negative for back pain, gait problem, neck pain and neck stiffness.  Skin: Negative for itching and rash. Positive for easy bruising.  Neurological: Negative for dizziness, extremity weakness, gait problem, headaches, light-headedness and seizures.  Hematological: Negative for adenopathy. Does not bruise/bleed easily.  Psychiatric/Behavioral: Negative for confusion, depression and sleep disturbance. The patient is not nervous/anxious.    PHYSICAL EXAMINATION:  Blood pressure (!) 147/94, pulse (!) 106, temperature 97.8 F (36.6 C), temperature source Tympanic, resp. rate 18, height 5' 11.5 (1.816 m), weight 180 lb 12.8 oz (82 kg), SpO2 99%.  ECOG PERFORMANCE STATUS: 1  Physical Exam  Constitutional: Oriented to person, place, and time and well-developed, well-nourished, and in no distress.  HENT:  Head: Normocephalic and atraumatic.  Mouth/Throat: Oropharynx is clear and moist. No oropharyngeal exudate.  Eyes: Conjunctivae are normal. Right eye exhibits no discharge. Left eye exhibits no discharge. No scleral icterus.  Neck: Normal range of motion. Neck supple.  Cardiovascular: Normal rate, regular rhythm, normal heart sounds and intact distal pulses.   Pulmonary/Chest: Effort normal and breath sounds normal. No respiratory distress. No wheezes. No rales.  Abdominal: Soft. Bowel sounds are normal. Exhibits no distension and no mass. There is no tenderness.  Musculoskeletal: Normal range of motion. Exhibits no edema.  Lymphadenopathy:    No cervical adenopathy.  Neurological: Alert and oriented to person, place, and time. Exhibits normal muscle tone.  Gait normal. Coordination normal.  Skin: Skin is warm and dry. Positive for bruising. No rash noted. Not diaphoretic. No erythema. No pallor.  Psychiatric: Mood, memory and judgment normal.  Vitals reviewed.  LABORATORY DATA: Lab Results  Component Value Date   WBC 11.5 (H) 11/10/2024   HGB 11.8 (L) 11/10/2024   HCT 41.7 11/10/2024   MCV 82.4 11/10/2024   PLT 558 (H) 11/10/2024      Chemistry      Component Value Date/Time   NA 144 11/10/2024 1422   NA 145 (H) 04/22/2024 0917   K 3.9 11/10/2024 1422   CL 104 11/10/2024 1422   CO2 27 11/10/2024 1422   BUN 12 11/10/2024 1422   BUN 10 04/22/2024 0917   CREATININE 1.05 11/10/2024 1422      Component Value Date/Time   CALCIUM  10.2 11/10/2024 1422   ALKPHOS 91 11/10/2024  1422   AST 31 11/10/2024 1422   ALT 28 11/10/2024 1422   BILITOT 0.2 11/10/2024 1422       RADIOGRAPHIC STUDIES:  NM Hepato W/EF Result Date: 10/29/2024 EXAM: NM HEPATOBILLARY SCAN 10/29/2024 09:23:49 AM TECHNIQUE: RADIOPHARMACEUTICAL: 5.4 mCi Tc-21m mebrofenin  Dynamic images of the abdomen and pelvis were obtained in the anterior projection for 1 hour after intravenous administration of radiopharmaceutical. Morphine  was administered intravenously and imaging continued for at least 30 minutes. COMPARISON: None available. CLINICAL HISTORY: Cholelithiasis. Cholelithiasis. FINDINGS: Homogenous uptake within the liver. Normal clearance of the blood pool. Appropriate excretion into the biliary system. Radiotracer activity is evident in the small bowel by 20 minutes. The gallbladder fails to fill at 60 minutes. Following intravenous administration of 3 mg of morphine , the gallbladder fills. IMPRESSION: 1. Delayed gallbladder filling after morphine  challenge, potential chronic cholecystitis. 2. Patent cystic duct and common bile duct. Electronically signed by: Norleen Boxer MD 10/29/2024 09:34 AM EST RP Workstation: [id number]   US  Abdomen Limited RUQ (LIVER/GB) Result  Date: 10/28/2024 CLINICAL DATA:  Right upper quadrant pain. EXAM: ULTRASOUND ABDOMEN LIMITED RIGHT UPPER QUADRANT COMPARISON:  Abdomen and pelvis CT from earlier same day FINDINGS: Gallbladder: Multiple small gallstones evident along the dependent gallbladder mucosa. Gallbladder wall is mildly thickened at 4.2 mm. No sonographic Murphy sign. No substantial pericholecystic fluid. Common bile duct: Diameter: 5 mm Liver: Liver is diffusely echogenic. 8 mm anechoic lesion inferior right liver compatible with a simple cyst. Portal vein is patent on color Doppler imaging with normal direction of blood flow towards the liver. Other: None. IMPRESSION: 1. Cholelithiasis with mild gallbladder wall thickening. No sonographic Murphy sign or substantial pericholecystic fluid. Given wall thickening, acute cholecystitis is a concern. 2. Echogenic liver parenchyma suggests fatty deposition. Electronically Signed   By: Camellia Candle M.D.   On: 10/28/2024 09:32   CT Angio Chest Pulmonary Embolism (PE) W or WO Contrast Result Date: 10/28/2024 CLINICAL DATA:  Some chest pain and shortness of breath beginning last night. Evaluate for pulmonary embolism. History of lung cancer. Right-sided abdominal pain. EXAM: CT ANGIOGRAPHY CHEST CT ABDOMEN AND PELVIS WITH CONTRAST TECHNIQUE: Multidetector CT imaging of the chest was performed using the standard protocol during bolus administration of intravenous contrast. Multiplanar CT image reconstructions and MIPs were obtained to evaluate the vascular anatomy. Multidetector CT imaging of the abdomen and pelvis was performed using the standard protocol during bolus administration of intravenous contrast. RADIATION DOSE REDUCTION: This exam was performed according to the departmental dose-optimization program which includes automated exposure control, adjustment of the mA and/or kV according to patient size and/or use of iterative reconstruction technique. CONTRAST:  OMNIPAQUE  IOHEXOL  350  MG/ML SOLN COMPARISON:  CT chest, abdomen and pelvis 09/22/2024 and 06/13/2024. Chest CT 04/20/2024. FINDINGS: CTA CHEST FINDINGS Cardiovascular: Heart is normal size with stable moderate pericardial effusion present. Calcified plaque over the left main coronary artery. Thoracic aorta is normal in caliber. Minimal calcified plaque over the descending thoracic aorta. Pulmonary arterial system is well opacified without evidence of emboli. Remaining vascular structures are unremarkable. Mediastinum/Nodes: No significant mediastinal or hilar adenopathy. Remaining mediastinal structures are unremarkable. Lungs/Pleura: Lungs are adequately inflated. Slight increase in size of dominant nodule over the right upper lobe (image 62) measuring 1.5 x 2 cm this has increased significantly since the prior exam from July 2025. Stable 6 mm nodule over the right upper lobe (image 48) and stable 5 mm nodule over the posterior right upper lobe (image 45). Stable subsolid nodule over  the periphery of the left upper lobe (image 70). There is new patchy bilateral nodular airspace opacification which may represent an acute infectious or inflammatory process versus progression of patient's underlying metastatic lung cancer. Small stable bilateral pleural effusions. Airways are unremarkable. Musculoskeletal: Stable well-defined lucent lesion over an upper thoracic vertebral body which is unchanged from 2018 and likely benign. No other focal bony abnormality. Review of the MIP images confirms the above findings. CT ABDOMEN and PELVIS FINDINGS Hepatobiliary: 7 mm hypodensity over the right lobe of the liver unchanged. Stable 3 mm hypodensity over the periphery of the right lobe of the liver. Gallbladder and biliary tree are normal. Pancreas: Normal. Spleen: Normal. Adrenals/Urinary Tract: Adrenal glands are normal. Kidneys are normal in size without hydronephrosis. Stable 9 mm left renal stone. Ureters and bladder are normal. Stomach/Bowel:  Stomach is normal. There are a few minimally prominent small bowel loops over the left abdomen without evidence of obstruction. Appendix is normal. Mild diverticulosis of the colon. Vascular/Lymphatic: Mild calcified plaque over the abdominal aorta which is normal caliber. No significant adenopathy. Reproductive: Prostate is unremarkable. Other: No free fluid.  No focal inflammatory change. Musculoskeletal: No focal abnormality. Review of the MIP images confirms the above findings. IMPRESSION: 1. No evidence of pulmonary embolism. 2. New patchy bilateral nodular airspace opacification which may represent an acute infectious or inflammatory process versus progression of patient's underlying metastatic lung cancer. Consider repeat CT 4-6 weeks post treatment. 3. Slight increase in size of known dominant nodule over the right upper lobe measuring 1.5 x 2 cm. This has increased significantly since the prior exam from July 2025. Other stable subcentimeter nodules as described. 4. Stable moderate pericardial effusion. 5. Stable small bilateral pleural effusions. 6. No acute findings in the abdomen/pelvis. 7. Stable 9 mm left renal stone. 8. Mild colonic diverticulosis. 9. Aortic atherosclerosis. Atherosclerotic coronary artery disease. Aortic Atherosclerosis (ICD10-I70.0). Electronically Signed   By: Toribio Agreste M.D.   On: 10/28/2024 08:12   CT ABDOMEN PELVIS W CONTRAST Result Date: 10/28/2024 CLINICAL DATA:  Some chest pain and shortness of breath beginning last night. Evaluate for pulmonary embolism. History of lung cancer. Right-sided abdominal pain. EXAM: CT ANGIOGRAPHY CHEST CT ABDOMEN AND PELVIS WITH CONTRAST TECHNIQUE: Multidetector CT imaging of the chest was performed using the standard protocol during bolus administration of intravenous contrast. Multiplanar CT image reconstructions and MIPs were obtained to evaluate the vascular anatomy. Multidetector CT imaging of the abdomen and pelvis was performed  using the standard protocol during bolus administration of intravenous contrast. RADIATION DOSE REDUCTION: This exam was performed according to the departmental dose-optimization program which includes automated exposure control, adjustment of the mA and/or kV according to patient size and/or use of iterative reconstruction technique. CONTRAST:  OMNIPAQUE  IOHEXOL  350 MG/ML SOLN COMPARISON:  CT chest, abdomen and pelvis 09/22/2024 and 06/13/2024. Chest CT 04/20/2024. FINDINGS: CTA CHEST FINDINGS Cardiovascular: Heart is normal size with stable moderate pericardial effusion present. Calcified plaque over the left main coronary artery. Thoracic aorta is normal in caliber. Minimal calcified plaque over the descending thoracic aorta. Pulmonary arterial system is well opacified without evidence of emboli. Remaining vascular structures are unremarkable. Mediastinum/Nodes: No significant mediastinal or hilar adenopathy. Remaining mediastinal structures are unremarkable. Lungs/Pleura: Lungs are adequately inflated. Slight increase in size of dominant nodule over the right upper lobe (image 62) measuring 1.5 x 2 cm this has increased significantly since the prior exam from July 2025. Stable 6 mm nodule over the right upper lobe (image 48)  and stable 5 mm nodule over the posterior right upper lobe (image 45). Stable subsolid nodule over the periphery of the left upper lobe (image 70). There is new patchy bilateral nodular airspace opacification which may represent an acute infectious or inflammatory process versus progression of patient's underlying metastatic lung cancer. Small stable bilateral pleural effusions. Airways are unremarkable. Musculoskeletal: Stable well-defined lucent lesion over an upper thoracic vertebral body which is unchanged from 2018 and likely benign. No other focal bony abnormality. Review of the MIP images confirms the above findings. CT ABDOMEN and PELVIS FINDINGS Hepatobiliary: 7 mm hypodensity  over the right lobe of the liver unchanged. Stable 3 mm hypodensity over the periphery of the right lobe of the liver. Gallbladder and biliary tree are normal. Pancreas: Normal. Spleen: Normal. Adrenals/Urinary Tract: Adrenal glands are normal. Kidneys are normal in size without hydronephrosis. Stable 9 mm left renal stone. Ureters and bladder are normal. Stomach/Bowel: Stomach is normal. There are a few minimally prominent small bowel loops over the left abdomen without evidence of obstruction. Appendix is normal. Mild diverticulosis of the colon. Vascular/Lymphatic: Mild calcified plaque over the abdominal aorta which is normal caliber. No significant adenopathy. Reproductive: Prostate is unremarkable. Other: No free fluid.  No focal inflammatory change. Musculoskeletal: No focal abnormality. Review of the MIP images confirms the above findings. IMPRESSION: 1. No evidence of pulmonary embolism. 2. New patchy bilateral nodular airspace opacification which may represent an acute infectious or inflammatory process versus progression of patient's underlying metastatic lung cancer. Consider repeat CT 4-6 weeks post treatment. 3. Slight increase in size of known dominant nodule over the right upper lobe measuring 1.5 x 2 cm. This has increased significantly since the prior exam from July 2025. Other stable subcentimeter nodules as described. 4. Stable moderate pericardial effusion. 5. Stable small bilateral pleural effusions. 6. No acute findings in the abdomen/pelvis. 7. Stable 9 mm left renal stone. 8. Mild colonic diverticulosis. 9. Aortic atherosclerosis. Atherosclerotic coronary artery disease. Aortic Atherosclerosis (ICD10-I70.0). Electronically Signed   By: Toribio Agreste M.D.   On: 10/28/2024 08:12   DG Chest Port 1 View Result Date: 10/28/2024 CLINICAL DATA:  Chest pain.  History of lung cancer. EXAM: PORTABLE CHEST 1 VIEW COMPARISON:  10/02/2024 FINDINGS: The cardio pericardial silhouette is enlarged.  Interstitial markings are diffusely coarsened with chronic features. Possible trace bilateral pleural effusions with suggestion of trace basilar atelectasis is well. Right upper lobe pulmonary nodules evident, better characterized on previous CT chest of 09/22/2024. Telemetry leads overlie the chest. IMPRESSION: 1. Chronic interstitial coarsening with possible trace bilateral pleural effusions and trace basilar atelectasis. 2. Right upper lobe pulmonary nodule, better characterized on previous CT chest. Electronically Signed   By: Camellia Candle M.D.   On: 10/28/2024 06:58     ASSESSMENT/PLAN:  This is a very pleasant 69 year old Caucasian male diagnosed with stage IV (T1b, N3, M1 C) non-small cell lung cancer, favoring adenocarcinoma.  The patient presented with a right upper lobe lung nodule in addition to right hilar, subcarinal, and bilateral mediastinal lymphadenopathy as well as supraclavicular lymphadenopathy.  The patient also has metastatic disease to the brain.  He was diagnosed in June 2022.  His PD-L1 expression is 80%.  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    The patient completed SRS to the brain lesions under the care of Dr. Patrcia.  His last treatment was on 05/04/2021. 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     The patient is currently undergoing systemic chemotherapy with carboplatin  for an AUC of 5, Alimta  500 mg per metered squared, Keytruda  200 mg IV every 3 weeks.  Status post 59 cycles. Starting from cycle #5, the patient started maintenance Alimta  and Keytruda  IV every 3 weeks.    The patient was seen with Dr. Sherrod today. He had a CTA performed in the hospital. Dr. Sherrod personally and independently reviewed the scan and discussed results with the patient today.  The scan showed Slight increase in size of known dominant nodule  over the right upper lobe measuring 1.5 x 2 cm.  Dr. Sherrod recommends close monitoring for now but if it continues to enlarge we can consider radiation.   Labs were reviewed.  Recommend that he proceed with cycle #60 today as scheduled.   We will see him back for follow-up visit in 3 weeks for evaluation repeat blood work before undergoing cycle #61.  He is scheduled for repeat brain MRI on 11/23/23   Adrenal insufficiency on steroid replacement Stable on steroid replacement therapy. - Continue current steroid replacement therapy.  The patient was advised to call immediately if she has any concerning symptoms in the interval. The patient voices understanding of current disease status and treatment options and is in agreement with the current care plan. All questions were answered. The patient knows to call the clinic with any problems, questions or concerns. We can certainly see the patient much sooner if necessary   No orders of the defined types were placed in this encounter.    Tyera Hansley L Daronte Shostak, PA-C 11/10/24  ADDENDUM: Hematology/Oncology Attending:  I had a face-to-face encounter with the patient today.  I reviewed his record, lab, scan and recommended his care plan.  This is a very pleasant 69 years old white male with stage IV non-small cell lung cancer, adenocarcinoma diagnosed in June 2022 with positive KRAS G12C mutation and PD-L1 expression of 80%.  He started initial treatment with systemic chemotherapy with carboplatin , Alimta  and Keytruda  every 3 weeks for 4 cycles starting with cycle #5 he has been on maintenance treatment with Alimta  and Keytruda  every 3 weeks status post a total of 59 cycles.  He has been tolerating this treatment well with no concerning adverse effects.  He has a recent admission to [facility] with suspicious lobar pneumonia as well as gallbladder disorders.  The patient was treated with a course of antibiotics and he is feeling much better.   He is here today for evaluation before resuming his treatment.  During his hospitalization he had CT angiogram of the chest on October 29, 2023 and that showed slight increase in size of known dominant nodule over the right upper lobe measuring 1.5 x 2.0 cm.  I discussed the scan results with the patient and his wife are recommended for him to continue his current treatment for now and will monitor the right upper lobe lung nodule closely on upcoming imaging studies. The patient was advised to call immediately if he has any other concerning symptoms in the interval. Disclaimer: This note was dictated with voice recognition software. Similar sounding words can inadvertently be transcribed and may be missed upon review. Sherrod MARLA Sherrod, MD

## 2024-11-10 ENCOUNTER — Inpatient Hospital Stay: Attending: Physician Assistant

## 2024-11-10 ENCOUNTER — Inpatient Hospital Stay: Admitting: Physician Assistant

## 2024-11-10 ENCOUNTER — Inpatient Hospital Stay

## 2024-11-10 VITALS — BP 147/94 | HR 106 | Temp 97.8°F | Resp 18 | Ht 71.5 in | Wt 180.8 lb

## 2024-11-10 VITALS — BP 156/92 | HR 98 | Temp 97.6°F | Resp 16

## 2024-11-10 DIAGNOSIS — C3491 Malignant neoplasm of unspecified part of right bronchus or lung: Secondary | ICD-10-CM

## 2024-11-10 DIAGNOSIS — Z5111 Encounter for antineoplastic chemotherapy: Secondary | ICD-10-CM | POA: Diagnosis not present

## 2024-11-10 LAB — CMP (CANCER CENTER ONLY)
ALT: 28 U/L (ref 0–44)
AST: 31 U/L (ref 15–41)
Albumin: 4.3 g/dL (ref 3.5–5.0)
Alkaline Phosphatase: 91 U/L (ref 38–126)
Anion gap: 14 (ref 5–15)
BUN: 12 mg/dL (ref 8–23)
CO2: 27 mmol/L (ref 22–32)
Calcium: 10.2 mg/dL (ref 8.9–10.3)
Chloride: 104 mmol/L (ref 98–111)
Creatinine: 1.05 mg/dL (ref 0.61–1.24)
GFR, Estimated: >60 mL/min
Glucose, Bld: 103 mg/dL — ABNORMAL HIGH (ref 70–99)
Potassium: 3.9 mmol/L (ref 3.5–5.1)
Sodium: 144 mmol/L (ref 135–145)
Total Bilirubin: 0.2 mg/dL (ref 0.0–1.2)
Total Protein: 8.1 g/dL (ref 6.5–8.1)

## 2024-11-10 LAB — CBC WITH DIFFERENTIAL (CANCER CENTER ONLY)
Abs Immature Granulocytes: 0.08 10*3/uL — ABNORMAL HIGH (ref 0.00–0.07)
Basophils Absolute: 0.1 10*3/uL (ref 0.0–0.1)
Basophils Relative: 1 %
Eosinophils Absolute: 0 10*3/uL (ref 0.0–0.5)
Eosinophils Relative: 0 %
HCT: 41.7 % (ref 39.0–52.0)
Hemoglobin: 11.8 g/dL — ABNORMAL LOW (ref 13.0–17.0)
Immature Granulocytes: 1 %
Lymphocytes Relative: 10 %
Lymphs Abs: 1.2 10*3/uL (ref 0.7–4.0)
MCH: 23.3 pg — ABNORMAL LOW (ref 26.0–34.0)
MCHC: 28.3 g/dL — ABNORMAL LOW (ref 30.0–36.0)
MCV: 82.4 fL (ref 80.0–100.0)
Monocytes Absolute: 0.5 10*3/uL (ref 0.1–1.0)
Monocytes Relative: 5 %
Neutro Abs: 9.7 10*3/uL — ABNORMAL HIGH (ref 1.7–7.7)
Neutrophils Relative %: 83 %
Platelet Count: 558 10*3/uL — ABNORMAL HIGH (ref 150–400)
RBC: 5.06 MIL/uL (ref 4.22–5.81)
RDW: 23.7 % — ABNORMAL HIGH (ref 11.5–15.5)
WBC Count: 11.5 10*3/uL — ABNORMAL HIGH (ref 4.0–10.5)
nRBC: 0 % (ref 0.0–0.2)

## 2024-11-10 MED ORDER — CYANOCOBALAMIN 1000 MCG/ML IJ SOLN
1000.0000 ug | Freq: Once | INTRAMUSCULAR | Status: AC
  Administered 2024-11-10: 1000 ug via INTRAMUSCULAR
  Filled 2024-11-10: qty 1

## 2024-11-10 MED ORDER — SODIUM CHLORIDE 0.9 % IV SOLN
500.0000 mg/m2 | Freq: Once | INTRAVENOUS | Status: AC
  Administered 2024-11-10: 1000 mg via INTRAVENOUS
  Filled 2024-11-10: qty 40

## 2024-11-10 MED ORDER — SODIUM CHLORIDE 0.9 % IV SOLN
200.0000 mg | Freq: Once | INTRAVENOUS | Status: AC
  Administered 2024-11-10: 200 mg via INTRAVENOUS
  Filled 2024-11-10: qty 200

## 2024-11-10 MED ORDER — SODIUM CHLORIDE 0.9 % IV SOLN: Freq: Once | INTRAVENOUS | Status: AC

## 2024-11-10 MED ORDER — PROCHLORPERAZINE MALEATE 10 MG PO TABS
10.0000 mg | ORAL_TABLET | Freq: Once | ORAL | Status: AC
  Administered 2024-11-10: 10 mg via ORAL
  Filled 2024-11-10: qty 1

## 2024-11-10 NOTE — Patient Instructions (Signed)
 [facility] - A DEPT OF [facility]  Discharge Instructions: Thank you for choosing [facility] to provide your oncology and hematology care.   If you have a lab appointment with the Cancer Center, please go directly to the Cancer Center and check in at the registration area.   Wear comfortable clothing and clothing appropriate for easy access to any Portacath or PICC line.   We strive to give you quality time with your provider. You may need to reschedule your appointment if you arrive late (15 or more minutes).  Arriving late affects you and other patients whose appointments are after yours.  Also, if you miss three or more appointments without notifying the office, you may be dismissed from the clinic at the provider's discretion.      For prescription refill requests, have your pharmacy contact our office and allow 72 hours for refills to be completed.    Today you received the following chemotherapy and/or immunotherapy agents: Pembrolizumab (Keytruda) and Pemetrexed (Alimta)      To help prevent nausea and vomiting after your treatment, we encourage you to take your nausea medication as directed.  BELOW ARE SYMPTOMS THAT SHOULD BE REPORTED IMMEDIATELY: *FEVER GREATER THAN 100.4 F (38 C) OR HIGHER *CHILLS OR SWEATING *NAUSEA AND VOMITING THAT IS NOT CONTROLLED WITH YOUR NAUSEA MEDICATION *UNUSUAL SHORTNESS OF BREATH *UNUSUAL BRUISING OR BLEEDING *URINARY PROBLEMS (pain or burning when urinating, or frequent urination) *BOWEL PROBLEMS (unusual diarrhea, constipation, pain near the anus) TENDERNESS IN MOUTH AND THROAT WITH OR WITHOUT PRESENCE OF ULCERS (sore throat, sores in mouth, or a toothache) UNUSUAL RASH, SWELLING OR PAIN  UNUSUAL VAGINAL DISCHARGE OR ITCHING   Items with * indicate a potential emergency and should be followed up as soon as possible or go to the Emergency Department if any problems should occur.  Please show the  CHEMOTHERAPY ALERT CARD or IMMUNOTHERAPY ALERT CARD at check-in to the Emergency Department and triage nurse.  Should you have questions after your visit or need to cancel or reschedule your appointment, please contact [facility] - A DEPT OF [facility]  Dept: [phone]  and follow the prompts.  Office hours are 8:00 a.m. to 4:30 p.m. Monday - Friday. Please note that voicemails left after 4:00 p.m. may not be returned until the following business day.  We are closed weekends and major holidays. You have access to a nurse at all times for urgent questions. Please call the main number to the clinic Dept: [phone] and follow the prompts.   For any non-urgent questions, you may also contact your provider using MyChart. We now offer e-Visits for anyone 12 and older to request care online for non-urgent symptoms. For details visit [email or web address].   Also download the MyChart app! Go to the app store, search "MyChart", open the app, select [facility], and log in with your MyChart username and password.

## 2024-11-11 ENCOUNTER — Other Ambulatory Visit (HOSPITAL_COMMUNITY): Payer: Self-pay

## 2024-11-11 ENCOUNTER — Encounter: Payer: Self-pay | Admitting: Internal Medicine

## 2024-11-11 ENCOUNTER — Telehealth (HOSPITAL_COMMUNITY): Payer: Self-pay

## 2024-11-11 ENCOUNTER — Other Ambulatory Visit: Payer: Self-pay

## 2024-11-12 ENCOUNTER — Encounter: Payer: Self-pay | Admitting: Internal Medicine

## 2024-11-12 ENCOUNTER — Telehealth: Payer: Self-pay

## 2024-11-12 ENCOUNTER — Other Ambulatory Visit (HOSPITAL_COMMUNITY): Payer: Self-pay

## 2024-11-12 NOTE — Telephone Encounter (Signed)
 Pharmacy Patient Advocate Encounter   Received notification from Pt Calls Messages that prior authorization for Testosterone  cypionate 100mg /ml inj is required/requested.   Insurance verification completed.   The patient is insured through [facility] AD[employer].   Per test claim: PA required; PA submitted to above mentioned insurance via Latent Key/confirmation #/EOC [id number] Status is pending

## 2024-11-13 ENCOUNTER — Encounter: Payer: Self-pay | Admitting: Internal Medicine

## 2024-11-19 ENCOUNTER — Other Ambulatory Visit

## 2024-11-22 ENCOUNTER — Other Ambulatory Visit

## 2024-11-23 ENCOUNTER — Inpatient Hospital Stay

## 2024-11-23 ENCOUNTER — Inpatient Hospital Stay: Admitting: Internal Medicine

## 2024-11-26 ENCOUNTER — Ambulatory Visit (HOSPITAL_COMMUNITY)

## 2024-12-01 ENCOUNTER — Inpatient Hospital Stay

## 2024-12-01 ENCOUNTER — Inpatient Hospital Stay: Admitting: Internal Medicine

## 2024-12-22 ENCOUNTER — Inpatient Hospital Stay: Admitting: Hematology

## 2024-12-22 ENCOUNTER — Inpatient Hospital Stay: Attending: Physician Assistant

## 2024-12-22 ENCOUNTER — Inpatient Hospital Stay: Admitting: Internal Medicine

## 2024-12-22 ENCOUNTER — Inpatient Hospital Stay

## 2025-01-12 ENCOUNTER — Ambulatory Visit

## 2025-01-12 ENCOUNTER — Inpatient Hospital Stay

## 2025-01-12 ENCOUNTER — Inpatient Hospital Stay: Admitting: Internal Medicine

## 2025-01-12 ENCOUNTER — Inpatient Hospital Stay: Attending: Physician Assistant

## 2025-01-13 ENCOUNTER — Ambulatory Visit: Admitting: "Endocrinology

## 2025-01-19 ENCOUNTER — Ambulatory Visit: Admitting: Physician Assistant

## 2025-01-26 ENCOUNTER — Ambulatory Visit: Admitting: Family Medicine

## 2025-02-02 ENCOUNTER — Inpatient Hospital Stay: Admitting: Internal Medicine

## 2025-02-02 ENCOUNTER — Inpatient Hospital Stay
